# Patient Record
Sex: Male | Born: 1940
Health system: Southern US, Community
[De-identification: ages and names within clinical notes are randomized; demographics above are authoritative.]

## PROBLEM LIST (undated history)

## (undated) DIAGNOSIS — D563 Thalassemia minor: Secondary | ICD-10-CM

## (undated) DIAGNOSIS — R51 Headache: Secondary | ICD-10-CM

## (undated) DIAGNOSIS — N182 Chronic kidney disease, stage 2 (mild): Secondary | ICD-10-CM

## (undated) DIAGNOSIS — Z9289 Personal history of other medical treatment: Secondary | ICD-10-CM

## (undated) DIAGNOSIS — I2510 Atherosclerotic heart disease of native coronary artery without angina pectoris: Secondary | ICD-10-CM

## (undated) DIAGNOSIS — I739 Peripheral vascular disease, unspecified: Secondary | ICD-10-CM

## (undated) DIAGNOSIS — K219 Gastro-esophageal reflux disease without esophagitis: Secondary | ICD-10-CM

## (undated) DIAGNOSIS — C801 Malignant (primary) neoplasm, unspecified: Secondary | ICD-10-CM

## (undated) DIAGNOSIS — I82409 Acute embolism and thrombosis of unspecified deep veins of unspecified lower extremity: Secondary | ICD-10-CM

## (undated) DIAGNOSIS — D509 Iron deficiency anemia, unspecified: Secondary | ICD-10-CM

## (undated) DIAGNOSIS — Z950 Presence of cardiac pacemaker: Secondary | ICD-10-CM

## (undated) DIAGNOSIS — E119 Type 2 diabetes mellitus without complications: Secondary | ICD-10-CM

## (undated) DIAGNOSIS — I10 Essential (primary) hypertension: Secondary | ICD-10-CM

## (undated) DIAGNOSIS — R05 Cough: Secondary | ICD-10-CM

## (undated) DIAGNOSIS — E785 Hyperlipidemia, unspecified: Secondary | ICD-10-CM

## (undated) DIAGNOSIS — T464X5A Adverse effect of angiotensin-converting-enzyme inhibitors, initial encounter: Secondary | ICD-10-CM

## (undated) DIAGNOSIS — J449 Chronic obstructive pulmonary disease, unspecified: Secondary | ICD-10-CM

## (undated) DIAGNOSIS — Z803 Family history of malignant neoplasm of breast: Secondary | ICD-10-CM

## (undated) DIAGNOSIS — I480 Paroxysmal atrial fibrillation: Secondary | ICD-10-CM

## (undated) DIAGNOSIS — K5790 Diverticulosis of intestine, part unspecified, without perforation or abscess without bleeding: Secondary | ICD-10-CM

## (undated) DIAGNOSIS — H269 Unspecified cataract: Secondary | ICD-10-CM

## (undated) DIAGNOSIS — S46009A Unspecified injury of muscle(s) and tendon(s) of the rotator cuff of unspecified shoulder, initial encounter: Secondary | ICD-10-CM

## (undated) DIAGNOSIS — Z8546 Personal history of malignant neoplasm of prostate: Secondary | ICD-10-CM

## (undated) DIAGNOSIS — R0789 Other chest pain: Secondary | ICD-10-CM

## (undated) DIAGNOSIS — R058 Other specified cough: Secondary | ICD-10-CM

## (undated) HISTORY — DX: Type 2 diabetes mellitus without complications: E11.9

## (undated) HISTORY — PX: ILIAC ARTERY ANEURYSM REPAIR: SUR1158

## (undated) HISTORY — DX: Diverticulosis of intestine, part unspecified, without perforation or abscess without bleeding: K57.90

## (undated) HISTORY — DX: Essential (primary) hypertension: I10

## (undated) HISTORY — DX: Adverse effect of angiotensin-converting-enzyme inhibitors, initial encounter: T46.4X5A

## (undated) HISTORY — DX: Unspecified cataract: H26.9

## (undated) HISTORY — PX: CATARACT EXTRACTION W/ INTRAOCULAR LENS IMPLANT: SHX1309

## (undated) HISTORY — DX: Hyperlipidemia, unspecified: E78.5

## (undated) HISTORY — DX: Family history of malignant neoplasm of breast: Z80.3

## (undated) HISTORY — DX: Gastro-esophageal reflux disease without esophagitis: K21.9

## (undated) HISTORY — DX: Personal history of malignant neoplasm of prostate: Z85.46

## (undated) HISTORY — PX: COLONOSCOPY: SHX174

## (undated) HISTORY — DX: Chronic kidney disease, stage 2 (mild): N18.2

## (undated) HISTORY — PX: CIRCUMCISION: SUR203

## (undated) HISTORY — DX: Other chest pain: R07.89

## (undated) HISTORY — DX: Acute embolism and thrombosis of unspecified deep veins of unspecified lower extremity: I82.409

## (undated) HISTORY — DX: Cough: R05

## (undated) HISTORY — DX: Other specified cough: R05.8

---

## 1999-11-06 ENCOUNTER — Encounter: Payer: Self-pay | Admitting: Emergency Medicine

## 1999-11-06 ENCOUNTER — Emergency Department (HOSPITAL_COMMUNITY): Admission: EM | Admit: 1999-11-06 | Discharge: 1999-11-06 | Payer: Self-pay | Admitting: Emergency Medicine

## 2000-05-22 ENCOUNTER — Emergency Department (HOSPITAL_COMMUNITY): Admission: EM | Admit: 2000-05-22 | Discharge: 2000-05-22 | Payer: Self-pay

## 2000-05-23 ENCOUNTER — Ambulatory Visit (HOSPITAL_COMMUNITY): Admission: RE | Admit: 2000-05-23 | Discharge: 2000-05-23 | Payer: Self-pay

## 2001-11-16 ENCOUNTER — Ambulatory Visit (HOSPITAL_COMMUNITY): Admission: RE | Admit: 2001-11-16 | Discharge: 2001-11-16 | Payer: Self-pay | Admitting: Emergency Medicine

## 2001-11-16 ENCOUNTER — Encounter: Payer: Self-pay | Admitting: Emergency Medicine

## 2004-05-04 ENCOUNTER — Encounter: Admission: RE | Admit: 2004-05-04 | Discharge: 2004-05-04 | Payer: Self-pay | Admitting: Emergency Medicine

## 2004-05-06 ENCOUNTER — Encounter: Admission: RE | Admit: 2004-05-06 | Discharge: 2004-05-06 | Payer: Self-pay | Admitting: Emergency Medicine

## 2004-10-16 ENCOUNTER — Ambulatory Visit: Payer: Self-pay | Admitting: Family Medicine

## 2004-11-09 ENCOUNTER — Ambulatory Visit: Payer: Self-pay | Admitting: Family Medicine

## 2004-12-25 ENCOUNTER — Ambulatory Visit: Payer: Self-pay | Admitting: Family Medicine

## 2005-01-01 ENCOUNTER — Ambulatory Visit: Payer: Self-pay | Admitting: Family Medicine

## 2005-01-22 ENCOUNTER — Ambulatory Visit: Payer: Self-pay | Admitting: Family Medicine

## 2005-03-29 ENCOUNTER — Ambulatory Visit: Payer: Self-pay | Admitting: Family Medicine

## 2005-09-30 ENCOUNTER — Ambulatory Visit: Payer: Self-pay | Admitting: Internal Medicine

## 2005-10-28 ENCOUNTER — Ambulatory Visit: Payer: Self-pay | Admitting: Family Medicine

## 2005-11-11 ENCOUNTER — Ambulatory Visit: Payer: Self-pay | Admitting: Family Medicine

## 2006-02-15 ENCOUNTER — Ambulatory Visit: Payer: Self-pay | Admitting: Family Medicine

## 2006-05-06 ENCOUNTER — Ambulatory Visit: Payer: Self-pay | Admitting: Family Medicine

## 2006-05-06 LAB — CONVERTED CEMR LAB
ALT: 26 units/L (ref 0–40)
AST: 18 units/L (ref 0–37)
Albumin: 4 g/dL (ref 3.5–5.2)
Alkaline Phosphatase: 55 units/L (ref 39–117)
BUN: 6 mg/dL (ref 6–23)
Basophils Absolute: 0 10*3/uL (ref 0.0–0.1)
Basophils Relative: 0.5 % (ref 0.0–1.0)
CO2: 32 meq/L (ref 19–32)
Calcium: 9.8 mg/dL (ref 8.4–10.5)
Chloride: 105 meq/L (ref 96–112)
Cholesterol: 188 mg/dL (ref 0–200)
Creatinine, Ser: 1 mg/dL (ref 0.4–1.5)
Eosinophils Absolute: 0.2 10*3/uL (ref 0.0–0.6)
Eosinophils Relative: 3.4 % (ref 0.0–5.0)
GFR calc Af Amer: 96 mL/min
GFR calc non Af Amer: 79 mL/min
Glucose, Bld: 102 mg/dL — ABNORMAL HIGH (ref 70–99)
HCT: 42.3 % (ref 39.0–52.0)
HDL: 32.9 mg/dL — ABNORMAL LOW (ref >39.0)
Hemoglobin: 13.7 g/dL (ref 13.0–17.0)
Hgb A1c MFr Bld: 6.8 % — ABNORMAL HIGH (ref 4.6–6.0)
LDL Cholesterol: 124 mg/dL — ABNORMAL HIGH (ref 0–99)
Lymphocytes Relative: 33.5 % (ref 12.0–46.0)
MCHC: 32.3 g/dL (ref 30.0–36.0)
MCV: 72.2 fL — ABNORMAL LOW (ref 78.0–100.0)
Monocytes Absolute: 0.5 10*3/uL (ref 0.2–0.7)
Monocytes Relative: 9.8 % (ref 3.0–11.0)
Neutro Abs: 2.8 10*3/uL (ref 1.4–7.7)
Neutrophils Relative %: 52.8 % (ref 43.0–77.0)
PSA: 4.22 ng/mL — ABNORMAL HIGH (ref 0.10–4.00)
Platelets: 228 10*3/uL (ref 150–400)
Potassium: 3.7 meq/L (ref 3.5–5.1)
RBC: 5.85 M/uL — ABNORMAL HIGH (ref 4.22–5.81)
RDW: 16.6 % — ABNORMAL HIGH (ref 11.5–14.6)
Sodium: 142 meq/L (ref 135–145)
TSH: 1.49 microintl units/mL (ref 0.35–5.50)
Total Bilirubin: 0.8 mg/dL (ref 0.3–1.2)
Total CHOL/HDL Ratio: 5.7
Total Protein: 7 g/dL (ref 6.0–8.3)
Triglycerides: 155 mg/dL — ABNORMAL HIGH (ref 0–149)
VLDL: 31 mg/dL (ref 0–40)
WBC: 5.3 10*3/uL (ref 4.5–10.5)

## 2006-05-13 ENCOUNTER — Ambulatory Visit: Payer: Self-pay | Admitting: Family Medicine

## 2006-05-25 ENCOUNTER — Ambulatory Visit: Payer: Self-pay | Admitting: Gastroenterology

## 2006-06-07 ENCOUNTER — Ambulatory Visit: Payer: Self-pay | Admitting: Gastroenterology

## 2007-04-21 ENCOUNTER — Ambulatory Visit: Payer: Self-pay | Admitting: Family Medicine

## 2007-04-21 DIAGNOSIS — K219 Gastro-esophageal reflux disease without esophagitis: Secondary | ICD-10-CM | POA: Insufficient documentation

## 2007-04-21 DIAGNOSIS — E785 Hyperlipidemia, unspecified: Secondary | ICD-10-CM | POA: Insufficient documentation

## 2007-04-21 DIAGNOSIS — I10 Essential (primary) hypertension: Secondary | ICD-10-CM | POA: Insufficient documentation

## 2007-04-25 ENCOUNTER — Ambulatory Visit: Payer: Self-pay | Admitting: Family Medicine

## 2007-04-25 DIAGNOSIS — Z87891 Personal history of nicotine dependence: Secondary | ICD-10-CM | POA: Insufficient documentation

## 2007-05-07 DIAGNOSIS — R05 Cough: Secondary | ICD-10-CM | POA: Insufficient documentation

## 2007-05-07 DIAGNOSIS — R059 Cough, unspecified: Secondary | ICD-10-CM | POA: Insufficient documentation

## 2007-05-07 DIAGNOSIS — R053 Chronic cough: Secondary | ICD-10-CM | POA: Insufficient documentation

## 2007-05-26 ENCOUNTER — Ambulatory Visit: Payer: Self-pay | Admitting: Family Medicine

## 2007-06-23 ENCOUNTER — Ambulatory Visit: Payer: Self-pay | Admitting: Family Medicine

## 2007-07-10 ENCOUNTER — Encounter: Payer: Self-pay | Admitting: Family Medicine

## 2007-07-11 ENCOUNTER — Encounter: Payer: Self-pay | Admitting: Family Medicine

## 2007-07-11 ENCOUNTER — Telehealth: Payer: Self-pay | Admitting: Family Medicine

## 2007-08-22 ENCOUNTER — Ambulatory Visit: Payer: Self-pay | Admitting: Family Medicine

## 2007-08-28 ENCOUNTER — Ambulatory Visit: Payer: Self-pay | Admitting: Family Medicine

## 2007-08-29 ENCOUNTER — Telehealth: Payer: Self-pay | Admitting: Family Medicine

## 2007-09-25 ENCOUNTER — Ambulatory Visit: Payer: Self-pay | Admitting: Cardiology

## 2007-09-25 ENCOUNTER — Emergency Department (HOSPITAL_COMMUNITY): Admission: EM | Admit: 2007-09-25 | Discharge: 2007-09-25 | Payer: Self-pay | Admitting: Emergency Medicine

## 2007-09-27 ENCOUNTER — Ambulatory Visit: Payer: Self-pay

## 2007-09-29 ENCOUNTER — Emergency Department (HOSPITAL_COMMUNITY): Admission: EM | Admit: 2007-09-29 | Discharge: 2007-09-29 | Payer: Self-pay | Admitting: Emergency Medicine

## 2007-10-11 ENCOUNTER — Ambulatory Visit: Payer: Self-pay | Admitting: Family Medicine

## 2007-10-11 DIAGNOSIS — M25519 Pain in unspecified shoulder: Secondary | ICD-10-CM | POA: Insufficient documentation

## 2008-06-05 ENCOUNTER — Emergency Department (HOSPITAL_COMMUNITY): Admission: EM | Admit: 2008-06-05 | Discharge: 2008-06-05 | Payer: Self-pay | Admitting: Emergency Medicine

## 2008-06-05 ENCOUNTER — Encounter: Payer: Self-pay | Admitting: Endocrinology

## 2008-06-05 DIAGNOSIS — G459 Transient cerebral ischemic attack, unspecified: Secondary | ICD-10-CM | POA: Insufficient documentation

## 2008-06-06 ENCOUNTER — Ambulatory Visit: Payer: Self-pay | Admitting: Family Medicine

## 2008-06-09 ENCOUNTER — Encounter: Admission: RE | Admit: 2008-06-09 | Discharge: 2008-06-09 | Payer: Self-pay | Admitting: Family Medicine

## 2008-06-11 ENCOUNTER — Ambulatory Visit: Payer: Self-pay

## 2008-06-13 ENCOUNTER — Telehealth (INDEPENDENT_AMBULATORY_CARE_PROVIDER_SITE_OTHER): Payer: Self-pay | Admitting: *Deleted

## 2008-06-13 ENCOUNTER — Telehealth: Payer: Self-pay | Admitting: Internal Medicine

## 2008-06-22 ENCOUNTER — Emergency Department (HOSPITAL_COMMUNITY): Admission: EM | Admit: 2008-06-22 | Discharge: 2008-06-23 | Payer: Self-pay | Admitting: Emergency Medicine

## 2008-06-25 ENCOUNTER — Ambulatory Visit: Payer: Self-pay | Admitting: Family Medicine

## 2008-07-26 ENCOUNTER — Telehealth: Payer: Self-pay | Admitting: Family Medicine

## 2008-08-09 ENCOUNTER — Emergency Department (HOSPITAL_COMMUNITY): Admission: EM | Admit: 2008-08-09 | Discharge: 2008-08-09 | Payer: Self-pay | Admitting: Emergency Medicine

## 2008-08-15 ENCOUNTER — Telehealth: Payer: Self-pay | Admitting: Family Medicine

## 2008-08-17 ENCOUNTER — Emergency Department (HOSPITAL_COMMUNITY): Admission: EM | Admit: 2008-08-17 | Discharge: 2008-08-17 | Payer: Self-pay | Admitting: Emergency Medicine

## 2008-08-19 ENCOUNTER — Ambulatory Visit: Payer: Self-pay | Admitting: Family Medicine

## 2008-08-19 DIAGNOSIS — R51 Headache: Secondary | ICD-10-CM | POA: Insufficient documentation

## 2008-08-19 DIAGNOSIS — R519 Headache, unspecified: Secondary | ICD-10-CM | POA: Insufficient documentation

## 2008-09-06 ENCOUNTER — Ambulatory Visit: Payer: Self-pay | Admitting: Internal Medicine

## 2008-09-06 ENCOUNTER — Observation Stay (HOSPITAL_COMMUNITY): Admission: EM | Admit: 2008-09-06 | Discharge: 2008-09-07 | Payer: Self-pay | Admitting: Emergency Medicine

## 2008-09-13 ENCOUNTER — Encounter: Payer: Self-pay | Admitting: Internal Medicine

## 2008-09-13 ENCOUNTER — Telehealth: Payer: Self-pay | Admitting: Internal Medicine

## 2008-09-13 DIAGNOSIS — R079 Chest pain, unspecified: Secondary | ICD-10-CM | POA: Insufficient documentation

## 2008-09-13 HISTORY — DX: Chest pain, unspecified: R07.9

## 2008-09-19 ENCOUNTER — Telehealth: Payer: Self-pay | Admitting: Internal Medicine

## 2008-09-19 ENCOUNTER — Encounter: Payer: Self-pay | Admitting: Internal Medicine

## 2008-09-25 ENCOUNTER — Telehealth: Payer: Self-pay | Admitting: Internal Medicine

## 2008-10-08 ENCOUNTER — Encounter: Admission: RE | Admit: 2008-10-08 | Discharge: 2008-10-08 | Payer: Self-pay | Admitting: Internal Medicine

## 2009-02-11 ENCOUNTER — Emergency Department (HOSPITAL_COMMUNITY): Admission: EM | Admit: 2009-02-11 | Discharge: 2009-02-11 | Payer: Self-pay | Admitting: Emergency Medicine

## 2009-04-21 ENCOUNTER — Encounter (INDEPENDENT_AMBULATORY_CARE_PROVIDER_SITE_OTHER): Payer: Self-pay | Admitting: *Deleted

## 2009-04-21 ENCOUNTER — Encounter: Admission: RE | Admit: 2009-04-21 | Discharge: 2009-04-21 | Payer: Self-pay | Admitting: Internal Medicine

## 2009-05-05 ENCOUNTER — Ambulatory Visit: Payer: Self-pay | Admitting: Surgery

## 2009-05-05 ENCOUNTER — Encounter: Admission: RE | Admit: 2009-05-05 | Discharge: 2009-05-05 | Payer: Self-pay | Admitting: Surgery

## 2009-05-05 ENCOUNTER — Encounter (INDEPENDENT_AMBULATORY_CARE_PROVIDER_SITE_OTHER): Payer: Self-pay | Admitting: *Deleted

## 2009-05-16 ENCOUNTER — Emergency Department (HOSPITAL_COMMUNITY): Admission: EM | Admit: 2009-05-16 | Discharge: 2009-05-16 | Payer: Self-pay | Admitting: Emergency Medicine

## 2009-07-21 ENCOUNTER — Emergency Department (HOSPITAL_COMMUNITY): Admission: EM | Admit: 2009-07-21 | Discharge: 2009-07-21 | Payer: Self-pay | Admitting: Emergency Medicine

## 2009-07-24 ENCOUNTER — Emergency Department (HOSPITAL_COMMUNITY): Admission: EM | Admit: 2009-07-24 | Discharge: 2009-07-24 | Payer: Self-pay | Admitting: Emergency Medicine

## 2009-07-28 ENCOUNTER — Telehealth: Payer: Self-pay | Admitting: Family Medicine

## 2009-08-23 ENCOUNTER — Observation Stay (HOSPITAL_COMMUNITY): Admission: EM | Admit: 2009-08-23 | Discharge: 2009-08-25 | Payer: Self-pay | Admitting: Emergency Medicine

## 2009-08-23 ENCOUNTER — Encounter (INDEPENDENT_AMBULATORY_CARE_PROVIDER_SITE_OTHER): Payer: Self-pay | Admitting: *Deleted

## 2009-08-25 ENCOUNTER — Encounter (INDEPENDENT_AMBULATORY_CARE_PROVIDER_SITE_OTHER): Payer: Self-pay | Admitting: Internal Medicine

## 2009-08-26 ENCOUNTER — Encounter (INDEPENDENT_AMBULATORY_CARE_PROVIDER_SITE_OTHER): Payer: Self-pay | Admitting: *Deleted

## 2009-09-18 ENCOUNTER — Ambulatory Visit: Payer: Self-pay | Admitting: Vascular Surgery

## 2009-09-22 ENCOUNTER — Ambulatory Visit (HOSPITAL_COMMUNITY): Admission: RE | Admit: 2009-09-22 | Discharge: 2009-09-22 | Payer: Self-pay | Admitting: Vascular Surgery

## 2009-09-22 ENCOUNTER — Ambulatory Visit: Payer: Self-pay | Admitting: Vascular Surgery

## 2009-09-26 ENCOUNTER — Encounter: Payer: Self-pay | Admitting: Gastroenterology

## 2009-10-01 ENCOUNTER — Inpatient Hospital Stay (HOSPITAL_COMMUNITY): Admission: RE | Admit: 2009-10-01 | Discharge: 2009-10-06 | Payer: Self-pay | Admitting: Vascular Surgery

## 2009-10-01 ENCOUNTER — Telehealth: Payer: Self-pay | Admitting: Gastroenterology

## 2009-10-01 HISTORY — PX: PR VEIN BYPASS GRAFT,AORTO-FEM-POP: 35551

## 2009-10-03 ENCOUNTER — Encounter: Payer: Self-pay | Admitting: Vascular Surgery

## 2009-10-03 ENCOUNTER — Ambulatory Visit: Payer: Self-pay | Admitting: Vascular Surgery

## 2009-10-03 DIAGNOSIS — K5730 Diverticulosis of large intestine without perforation or abscess without bleeding: Secondary | ICD-10-CM | POA: Insufficient documentation

## 2009-10-03 DIAGNOSIS — D509 Iron deficiency anemia, unspecified: Secondary | ICD-10-CM | POA: Insufficient documentation

## 2009-10-03 DIAGNOSIS — R634 Abnormal weight loss: Secondary | ICD-10-CM | POA: Insufficient documentation

## 2009-10-17 ENCOUNTER — Emergency Department (HOSPITAL_COMMUNITY): Admission: EM | Admit: 2009-10-17 | Discharge: 2009-10-17 | Payer: Self-pay | Admitting: Emergency Medicine

## 2009-10-20 ENCOUNTER — Encounter: Admission: RE | Admit: 2009-10-20 | Discharge: 2009-10-20 | Payer: Self-pay | Admitting: Internal Medicine

## 2009-10-22 ENCOUNTER — Encounter (HOSPITAL_COMMUNITY): Admission: RE | Admit: 2009-10-22 | Discharge: 2009-12-19 | Payer: Self-pay | Admitting: Internal Medicine

## 2009-10-29 ENCOUNTER — Telehealth: Payer: Self-pay | Admitting: Family Medicine

## 2009-10-30 ENCOUNTER — Ambulatory Visit (HOSPITAL_COMMUNITY): Admission: RE | Admit: 2009-10-30 | Discharge: 2009-10-30 | Payer: Self-pay | Admitting: Gastroenterology

## 2009-11-06 ENCOUNTER — Ambulatory Visit: Payer: Self-pay | Admitting: Vascular Surgery

## 2009-12-17 ENCOUNTER — Ambulatory Visit: Payer: Self-pay | Admitting: Vascular Surgery

## 2010-03-18 ENCOUNTER — Ambulatory Visit: Payer: Self-pay | Admitting: Vascular Surgery

## 2010-04-17 ENCOUNTER — Emergency Department (HOSPITAL_COMMUNITY)
Admission: EM | Admit: 2010-04-17 | Discharge: 2010-04-18 | Payer: Self-pay | Source: Home / Self Care | Admitting: Emergency Medicine

## 2010-04-27 LAB — COMPREHENSIVE METABOLIC PANEL
ALT: 17 U/L (ref 0–53)
AST: 18 U/L (ref 0–37)
Albumin: 3.6 g/dL (ref 3.5–5.2)
Alkaline Phosphatase: 68 U/L (ref 39–117)
BUN: 12 mg/dL (ref 6–23)
CO2: 28 mEq/L (ref 19–32)
Calcium: 9.3 mg/dL (ref 8.4–10.5)
Chloride: 107 mEq/L (ref 96–112)
Creatinine, Ser: 1.09 mg/dL (ref 0.4–1.5)
GFR calc Af Amer: >60 mL/min (ref >60)
GFR calc non Af Amer: >60 mL/min (ref >60)
Glucose, Bld: 127 mg/dL — ABNORMAL HIGH (ref 70–99)
Potassium: 3.9 mEq/L (ref 3.5–5.1)
Sodium: 143 mEq/L (ref 135–145)
Total Bilirubin: 0.3 mg/dL (ref 0.3–1.2)
Total Protein: 6.7 g/dL (ref 6.0–8.3)

## 2010-04-27 LAB — POCT CARDIAC MARKERS
CKMB, poc: 4.2 ng/mL (ref 1.0–8.0)
Myoglobin, poc: 147 ng/mL (ref 12–200)
Troponin i, poc: <0.05 ng/mL (ref 0.00–0.09)

## 2010-04-27 LAB — CBC
HCT: 38.7 % — ABNORMAL LOW (ref 39.0–52.0)
Hemoglobin: 12.2 g/dL — ABNORMAL LOW (ref 13.0–17.0)
MCH: 22 pg — ABNORMAL LOW (ref 26.0–34.0)
MCHC: 31.5 g/dL (ref 30.0–36.0)
MCV: 69.9 fL — ABNORMAL LOW (ref 78.0–100.0)
Platelets: 162 10*3/uL (ref 150–400)
RBC: 5.54 MIL/uL (ref 4.22–5.81)
RDW: 17.7 % — ABNORMAL HIGH (ref 11.5–15.5)
WBC: 4.9 10*3/uL (ref 4.0–10.5)

## 2010-04-27 LAB — LIPASE, BLOOD: Lipase: 58 U/L (ref 11–59)

## 2010-05-03 ENCOUNTER — Encounter: Payer: Self-pay | Admitting: Neurological Surgery

## 2010-05-03 ENCOUNTER — Encounter: Payer: Self-pay | Admitting: Internal Medicine

## 2010-05-13 DEATH — deceased

## 2010-05-14 NOTE — Letter (Signed)
Summary: triage message  triage message   Imported By: Kassie Mends 07/11/2007 15:50:28  _____________________________________________________________________  External Attachment:    Type:   Image     Comment:   triage message

## 2010-05-14 NOTE — Progress Notes (Signed)
Summary: cx cath   Phone Note Outgoing Call   Call placed to: Patient Summary of Call: Joel [name] called pt because he had not shown up for labwork and his cath is sch for 6/18, pt stated he felt pretty good right now and is cancelling his cath because he has too much going on right now, will let Dr Lovell Sheehan and Dr Gala Romney know pt to call Dr Lovell Sheehan if has further problems Meredith Staggers, RN  September 25, 2008 8:52 AM       Appended Document: cx cath agreed.

## 2010-05-14 NOTE — Letter (Signed)
Summary: Cardiac Catheterization Instructions- [facility]  [facility], Main Office  [address]   [place], [place] [address]   Phone: [phone]  Fax: [phone]     09/19/2008 MRN: [id number]  Joel [name] [address] [place], [place]  [address]  Dear [name],   You are scheduled for a Cardiac Catheterization on Fri 6/18 with  Dr. Antoine Poche  Please arrive to the 1st floor of the Heart and Vascular Center at [facility] at 10:30 am / pm on the day of your procedure. Please do not arrive before 6:30 a.m. Call the Heart and Vascular Center at [phone] if you are unable to make your appointmnet. The Code to get into the parking garage under the building is 0700. Take the elevators to the 1st floor. You must have someone to drive you home. Someone must be with you for the first 24 hours after you arrive home. Please wear clothes that are easy to get on and off and wear slip-on shoes. Do not eat or drink after midnight except water with your medications that morning. Bring all your medications and current insurance cards with you.  _X__ DO NOT take these medications before your procedure: _______HCTZ_________________________________________________________  ___ Make sure you take your aspirin.  ___ You may take ALL of your medications with water that morning. ________________________________________________________________________________________________________________________________  ___ DO NOT take ANY medications before your procedure.  ___ Pre-med instructions:  ________________________________________________________________________________________________________________________________  The usual length of stay after your procedure is 2 to 3 hours. This can vary.  If you have any questions, please call the office at the number listed above.   Meredith Staggers, RN

## 2010-05-14 NOTE — Progress Notes (Signed)
Summary: resch cath   Phone Note Outgoing Call   Call placed by: Meredith Staggers, RN,  September 19, 2008 8:41 AM Call placed to: Patient Summary of Call: pt was sch for cath today at 10:30, never came in for labwork called pt, he stated he lost his paper with the info and needed to resch cath to next fri 6/18, called cath lab resch to 6/18 at 11:30 with Dr Antoine Poche, called pt back with instructions will also mail them to pt, pt sch to come for labs on Mon 6/14, pt agreeable

## 2010-05-14 NOTE — Progress Notes (Signed)
Summary: ins call  Phone Note Call from Patient   Summary of Call: pt walked in . left paper from [employer] by [employer] ins. we are to call [phone] to confirm his dx of htn. Initial call taken by: Sindy Guadeloupe RN,  July 11, 2007 1:53 PM  Follow-up for Phone Call        per dr Jonia Oakey: [facility] called to confirm dx of htn Follow-up by: Sindy Guadeloupe RN,  July 11, 2007 1:54 PM

## 2010-05-14 NOTE — Letter (Signed)
Summary: [facility]   Imported By: Kassie Mends 07/11/2007 15:48:52  _____________________________________________________________________  External Attachment:    Type:   Image     Comment:   [facility]

## 2010-05-14 NOTE — Miscellaneous (Signed)
Summary: Orders Update  Medications Added AGGRENOX 25-200 MG XR12H-CAP (ASPIRIN-DIPYRIDAMOLE) 1 bid       Clinical Lists Changes  Problems: Added new problem of TIA (ICD-435.9) Medications: Removed medication of BL ASPIRIN 325 MG  TABS (ASPIRIN) once daily Added new medication of AGGRENOX 25-200 MG XR12H-CAP (ASPIRIN-DIPYRIDAMOLE) 1 bid - Signed Rx of AGGRENOX 25-200 MG XR12H-CAP (ASPIRIN-DIPYRIDAMOLE) 1 bid;  #60 x 0;  Signed;  Entered by: Minus Breeding MD;  Authorized by: Minus Breeding MD;  Method used: Electronically to [facility]  [id number]*, [address] [place], [place], [place]  [address], Ph: [phone] or [phone], Fax: [phone] Orders: Added new Referral order of Vascular Clinic (Vascular) - Signed Added new Referral order of Radiology Referral (Radiology) - Signed    Prescriptions: AGGRENOX 25-200 MG XR12H-CAP (ASPIRIN-DIPYRIDAMOLE) 1 bid  #60 x 0   Entered and Authorized by:   Minus Breeding MD   Signed by:   Minus Breeding MD on 06/05/2008   Method used:   Electronically to        [facility]  [id number]* (retail)       [address]       [place], [place]  [address]       Ph: [phone] or [phone]       Fax: [phone]   RxID:   [id number]

## 2010-05-14 NOTE — Assessment & Plan Note (Signed)
Summary: roa/jls   Vital Signs:  Patient Profile:   70 Years Old Male Weight:      234 pounds Temp:     97.9 degrees F Pulse rate:   60 / minute BP sitting:   118 / 70  Vitals Entered By: Sindy Guadeloupe RN (April 25, 2007 1:40 PM)                 Chief Complaint:  follow up.  History of Present Illness: she is a 70 year old male with underlying hypertension, and tobacco abuse, who comes in today for follow-up of hypertension.  His blood pressure was markedly elevated.  Last week.  We begin lisinopril 30 mg a day.  He comes back today for follow-up.  He says he feels well has had no side effects from the medication and indeed, his headache is gone.  Last week his systolic blood pressure was running 170 to 180.  Now today his blood pressure is 140/70.  Current Allergies (reviewed today): No known allergies     Risk Factors:     Counseled to quit/cut down tobacco use:  yes   Review of Systems      See HPI   Physical Exam  General:     Well-developed,well-nourished,in no acute distress; alert,appropriate and cooperative throughout examination Heart:     140/70    Impression & Recommendations:  Problem # 1:  HYPERTENSION (ICD-401.9) Assessment: Improved  His updated medication list for this problem includes:    Lisinopril 20 Mg Tabs (Lisinopril) .Marland Kitchen... 1 1/2  tabs once daily    Hydrochlorothiazide 25 Mg Tabs (Hydrochlorothiazide) .Marland Kitchen... Take 1 tablet by mouth once a day    Atenolol 50 Mg Tabs (Atenolol) .Marland Kitchen... 1 and 1/2 tasbs qam   Problem # 2:  TOBACCO ABUSE (ICD-305.1) Assessment: Unchanged  Complete Medication List: 1)  Lisinopril 20 Mg Tabs (Lisinopril) .Marland Kitchen.. 1 1/2  tabs once daily 2)  Omeprazole 20 Mg Tbec (Omeprazole) .... Qs 3)  Hydrochlorothiazide 25 Mg Tabs (Hydrochlorothiazide) .... Take 1 tablet by mouth once a day 4)  Atenolol 50 Mg Tabs (Atenolol) .Marland Kitchen.. 1 and 1/2 tasbs qam 5)  Bl Aspirin 325 Mg Tabs (Aspirin) .... Once daily   Patient  Instructions: 1)  Tobacco is very bad for your health and your loved ones! You Should stop smoking!. 2)  Stop Smoking Tips: Choose a Quit date. Cut down before the Quit date. decide what you will do as a substitute when you feel the urge to smoke(gum,toothpick,exercise). 3)  Take an Aspirin every day. 4)  Check your Blood Pressure regularly. If it is above: you should make an appointment. checks her blood pressure twice a day.  Return in 4 weeks for follow-up.  Strongly consider a no smoking program.  We discussed the negative problems with tobacco, heart attack, stroke, lung cancer, bladder cancer, etc.    ]

## 2010-05-14 NOTE — Assessment & Plan Note (Signed)
Summary: 1 month roa/jls   Vital Signs:  Patient Profile:   70 Years Old Male Weight:      231 pounds Temp:     98 degrees F oral Pulse rate:   70 / minute Resp:     12 per minute BP sitting:   163 / 88  Pt. in pain?   no                  Chief Complaint:  follow-up hypertension and and smoking.  History of Present Illness: Joel [name] is a 70 year old male, who comes in today for evaluation of hypertension, and tobacco abuse.  We increased his lisinopril to 30 mg a day.  A month ago.  Now is coughing and he can't stand it.  Says he wakes up at night coughing.  BP at home is running 147/70.  Otherwise, he is tolerating his medicines well.  He is beginning to taper his cigarettes is down 3 or 4 a day.  He says he needs some help to taper further and would like the new smoking cessation program.    Current Allergies: No known allergies   Past Medical History:    Reviewed history from 04/21/2007 and no changes required:       GERD       Hyperlipidemia       Hypertension       tobacco abuse       cough secondary to ACE inhibitor   Social History:    Reviewed history from 04/21/2007 and no changes required:       Single       Current Smoker       Alcohol use-no       Drug use-no       Regular exercise-no   Risk Factors:     Counseled to quit/cut down tobacco use:  yes   Review of Systems      See HPI   Physical Exam  General:     Well-developed,well-nourished,in no acute distress; alert,appropriate and cooperative throughout examination Head:     Normocephalic and atraumatic without obvious abnormalities. No apparent alopecia or balding. Eyes:     No corneal or conjunctival inflammation noted. EOMI. Perrla. Funduscopic exam benign, without hemorrhages, exudates or papilledema. Vision grossly normal. Ears:     External ear exam shows no significant lesions or deformities.  Otoscopic examination reveals clear canals, tympanic membranes are intact  bilaterally without bulging, retraction, inflammation or discharge. Hearing is grossly normal bilaterally. Nose:     External nasal examination shows no deformity or inflammation. Nasal mucosa are pink and moist without lesions or exudates. Mouth:     Oral mucosa and oropharynx without lesions or exudates.  Teeth in good repair. Heart:     140/80    Impression & Recommendations:  Problem # 1:  HYPERTENSION (ICD-401.9) Assessment: Improved  The following medications were removed from the medication list:    Lisinopril 20 Mg Tabs (Lisinopril) .Marland Kitchen... 1 1/2  tabs once daily  His updated medication list for this problem includes:    Hydrochlorothiazide 25 Mg Tabs (Hydrochlorothiazide) .Marland Kitchen... Take 1 tablet by mouth once a day    Atenolol 50 Mg Tabs (Atenolol) .Marland Kitchen... 1 and 1/2 tasbs qam    Benicar 40 Mg Tabs (Olmesartan medoxomil) .Marland Kitchen... Take 1 tablet by mouth every morning  Orders: Tobacco use cessation intermediate 3-10 minutes ([address])   Problem # 2:  TOBACCO ABUSE (ICD-305.1) Assessment: Improved  Orders: Tobacco use cessation  intermediate 3-10 minutes ([address])  His updated medication list for this problem includes:    Chantix Starting Month Pak 0.5 Mg X 11 & 1 Mg X 42 Misc (Varenicline tartrate) ..... Uad   Problem # 3:  COUGH DUE TO ACE INHIBITORS (ICD-786.2) Assessment: New  Orders: Tobacco use cessation intermediate 3-10 minutes ([address])   Complete Medication List: 1)  Omeprazole 20 Mg Tbec (Omeprazole) .... Qs 2)  Hydrochlorothiazide 25 Mg Tabs (Hydrochlorothiazide) .... Take 1 tablet by mouth once a day 3)  Atenolol 50 Mg Tabs (Atenolol) .Marland Kitchen.. 1 and 1/2 tasbs qam 4)  Bl Aspirin 325 Mg Tabs (Aspirin) .... Once daily 5)  Benicar 40 Mg Tabs (Olmesartan medoxomil) .... Take 1 tablet by mouth every morning 6)  Chantix Starting Month Pak 0.5 Mg X 11 & 1 Mg X 42 Misc (Varenicline tartrate) .... Uad   Patient Instructions: 1)  stop the lisinopril.  Began Benicar 40 mg one  tablet every day.  Continue your other medications.  Return in 4 weeks for reevaluation when you come back in 4 weeks bring a notebook with [place] blood pressure readings and the device using to check your blood pressure, so I can check to be, sure it's calibrated right. 2)  Begin the program as we outline for smoking cessation.    Prescriptions: CHANTIX STARTING MONTH PAK 0.5 MG X 11 & 1 MG X 42  MISC (VARENICLINE TARTRATE) UAD  #1 x 0   Entered and Authorized by:   Roderick Pee MD   Signed by:   Roderick Pee MD on 05/26/2007   Method used:   Electronically sent to ...       [facility]  [id number]*       [address]       [place], [place]  [address]       Ph: [phone] or [phone]       Fax: [phone]   RxID:   [id number] BENICAR 40 MG  TABS (OLMESARTAN MEDOXOMIL) Take 1 tablet by mouth every morning  #100 x 4   Entered and Authorized by:   Roderick Pee MD   Signed by:   Roderick Pee MD on 05/26/2007   Method used:   Electronically sent to ...       [facility]  [id number]*       [address]       [place], [place]  [address]       Ph: [phone] or [phone]       Fax: [phone]   RxID:   [id number]  ]

## 2010-05-14 NOTE — Progress Notes (Signed)
Summary: hctz refill  Phone Note Refill Request   Refills Requested: Medication #1:  HYDROCHLOROTHIAZIDE 25 MG  TABS Take 1 tablet by mouth once a day Initial call taken by: Kern Reap CMA Duncan Dull),  July 28, 2009 10:38 AM    Prescriptions: HYDROCHLOROTHIAZIDE 25 MG  TABS (HYDROCHLOROTHIAZIDE) Take 1 tablet by mouth once a day  #100 Tablet x 0   Entered by:   Kern Reap CMA (AAMA)   Authorized by:   Roderick Pee MD   Signed by:   Kern Reap CMA (AAMA) on 07/28/2009   Method used:   Electronically to        [facility] (retail)       [address]       [place], [place]  [id number]       Ph: [phone]       Fax: [phone]   RxID:   [id number]

## 2010-05-14 NOTE — Progress Notes (Signed)
Summary: Triage   Phone Note From Other Clinic   Caller: Dora @ Dr. Nehemiah Settle  [phone] Call For: Dr. Arlyce Dice Summary of Call: Iron Def and anemia...requesting sooner appt. than his sch'd appt on 8.2.11 Initial call taken by: Karna Christmas,  October 01, 2009 9:33 AM  Follow-up for Phone Call        Pt. will see [facility] on 10-06-09 at 1:30pm. Msg. left for Dora to advise pt. of appt/med.list/co-pay/cx.policy and I asked her to fax records to [phone] ATTN: Pam.  Follow-up by: Laureen Ochs LPN,  October 01, 2009 12:31 PM     Appended Document: Triage Pt. was a 'No Show' for appt. today. I have left a msg. for Dora w/Dr.Polite advising her and asked her to callback if pt. needs to be rescheduled.

## 2010-05-14 NOTE — Assessment & Plan Note (Signed)
Summary: 1 month rov/njr   Vital Signs:  Patient Profile:   70 Years Old Male Weight:      231 pounds Temp:     97.8 degrees F BP sitting:   160 / 90  (right arm)  Vitals Entered By: Rock Nephew CMA (June 23, 2007 1:53 PM)                 Chief Complaint:  1 mo follow up.  History of Present Illness: Joel [name] is a 70 year old male, who comes in today for evaluation of hypertension, and tobacco abuse.  His blood pressure is markedly improved on been a car one tablet daily.  We previously had a mono and ACE inhibitor, but he developed a severe cough and was unable to tolerate the ACE inhibitor.  Therefore, we switched him to Benicar 40 mg daily in [place].  Remarkably well.  BP today by me was 130/80.  He continues to smoke.  He says he stands to 7 cigarettes a day from one to two packs a day.  I outlined a tapering program over the next 6 weeks and encouraged him strongly to stop smoking completely.    Current Allergies: ! ZESTRIL  Past Medical History:    Reviewed history from 05/26/2007 and no changes required:       GERD       Hyperlipidemia       Hypertension       tobacco abuse       cough secondary to ACE inhibitor   Social History:    Reviewed history from 04/21/2007 and no changes required:       Single       Current Smoker       Alcohol use-no       Drug use-no       Regular exercise-no   Risk Factors:     Counseled to quit/cut down tobacco use:  yes   Review of Systems      [name] HPI   Physical Exam  General:     Well-developed,well-nourished,in no acute distress; alert,appropriate and cooperative throughout examination Head:     Normocephalic and atraumatic without obvious abnormalities. No apparent alopecia or balding. Eyes:     No corneal or conjunctival inflammation noted. EOMI. Perrla. Funduscopic exam benign, without hemorrhages, exudates or papilledema. Vision grossly normal. Ears:     External ear exam shows no significant  lesions or deformities.  Otoscopic examination reveals clear canals, tympanic membranes are intact bilaterally without bulging, retraction, inflammation or discharge. Hearing is grossly normal bilaterally. Nose:     External nasal examination shows no deformity or inflammation. Nasal mucosa are pink and moist without lesions or exudates. Mouth:     Oral mucosa and oropharynx without lesions or exudates.  Teeth in good repair. Neck:     No deformities, masses, or tenderness noted. Chest Wall:     No deformities, masses, tenderness or gynecomastia noted. Lungs:     Normal respiratory effort, chest expands symmetrically. Lungs are clear to auscultation, no crackles or wheezes. Heart:     130/80    Impression & Recommendations:  Problem # 1:  HYPERTENSION (ICD-401.9) Assessment: Improved  His updated medication list for this problem includes:    Hydrochlorothiazide 25 Mg Tabs (Hydrochlorothiazide) .Marland Kitchen... Take 1 tablet by mouth once a day    Atenolol 50 Mg Tabs (Atenolol) .Marland Kitchen... 1 and 1/2 tasbs qam    Benicar 40 Mg Tabs (Olmesartan medoxomil) .Marland Kitchen... Take 1 tablet by  mouth every morning   Problem # 2:  TOBACCO ABUSE (ICD-305.1) Assessment: Unchanged  The following medications were removed from the medication list:    Chantix Starting Month Pak 0.5 Mg X 11 & 1 Mg X 42 Misc (Varenicline tartrate) ..... Uad  Orders: Tobacco use cessation intermediate 3-10 minutes ([address])   Complete Medication List: 1)  Omeprazole 20 Mg Tbec (Omeprazole) .... Qs 2)  Hydrochlorothiazide 25 Mg Tabs (Hydrochlorothiazide) .... Take 1 tablet by mouth once a day 3)  Atenolol 50 Mg Tabs (Atenolol) .Marland Kitchen.. 1 and 1/2 tasbs qam 4)  Bl Aspirin 325 Mg Tabs (Aspirin) .... Once daily 5)  Benicar 40 Mg Tabs (Olmesartan medoxomil) .... Take 1 tablet by mouth every morning   Patient Instructions: 1)  continue current medical therapy.  Check your blood pressure daily and return in 3 months for reevaluation of your blood  pressure. 2)  Start a smoking cessation program with tapering by one per week.  This will give you a 7-week taper.    ]

## 2010-05-14 NOTE — Progress Notes (Signed)
Summary: Darvocette       New/Updated Medications: DARVOCET A500 100-500 MG TABS (PROPOXYPHENE N-APAP) one by mouth 6 hrs as needed pain   Prescriptions: DARVOCET A500 100-500 MG TABS (PROPOXYPHENE N-APAP) one by mouth 6 hrs as needed pain  #40 x 0   Entered by:   Lynann Beaver CMA   Authorized by:   Gordy Savers  MD   Signed by:   Lynann Beaver CMA on 06/13/2008   Method used:   Telephoned to ...       [facility]  [id number]* (retail)       [address]       [place], [place]  [address]       Ph: [phone] or [phone]       Fax: [phone]   RxID:   [id number]  Pt notified.

## 2010-05-14 NOTE — Discharge Summary (Signed)
Summary: Anemia, Dyspnea  NAME:  Joel [name], Joel [name]             ACCOUNT NO.:  [id number]      MEDICAL RECORD NO.:  [id number]          PATIENT TYPE:  OBV      LOCATION:  1430                         FACILITY:  [facility]      PHYSICIAN:  Deirdre Peer. Polite, M.D. DATE OF BIRTH:  01-May-1940      DATE OF ADMISSION:  08/23/2009   DATE OF DISCHARGE:  08/25/2009                                  DISCHARGE SUMMARY      DISCHARGE DIAGNOSES:   1. Dyspnea, probably secondary to anemia, please note Hemoccult       negative, probably related to blood loss from previous surgery.   2. Anemia, status post 2 units packed red blood cells transfusion.       Hemoglobin prior to transfusion 8.1, follow-up hemoglobin will be       obtained on an outpatient basis.   3. Hypertension controlled.   4. Hyperlipidemia.   5. Tobacco use.   6. Status post iliac aneurysm repair, currently on anticoagulation,       INR at discharge within normal limits.   7. Weight loss, probably multifactorial for further outpatient       followup.   8. Signs of emboli phenomenon, left foot first and third toes with       possible intercurrent infection, already on outpatient antibiotics.       The patient will continue antibiotic course of Keflex.      DISCHARGE MEDICATIONS:   1. Benicar 40 mg daily.   2. Coumadin 5 mg daily.   3. Ferrous sulfate 325 mg t.i.d.   4. Flomax 0.4 mg daily.   5. Keflex 500 mg t.i.d.   6. Pletal 100 mg b.i.d.   7. Protonix 40 mg daily.   8. Tenormin 50 mg 1-1/2 tablets daily.   9. Zocor was discontinued.   10.Roxicodone 5 mg q.6 hours p.r.n.   11.Hydrochlorothiazide 25 mg daily.   12.The patient was asked to hold his aspirin.      DISPOSITION:  The patient was discharged to home in stable condition   with home health PT and OT.  Recommend outpatient followup in 2-3 days.      HISTORY OF PRESENT ILLNESS:  The patient was seen in the ED and   evaluated for shortness of breath.  In the ED, he was  evaluated and   admission was deemed necessary for further evaluation and treatment.   Please see dictated H and P for further details.      PAST MEDICAL HISTORY:  Per admission H and P.      MEDICATION:  Per admission H and P.      SOCIAL HISTORY:  Per admission H and P.      PAST SURGICAL HISTORY:  Per admission H and P.      HOSPITAL COURSE:  The patient was admitted to a telemetry floor bed for   evaluation and treatment of dyspnea.  The patient underwent a 2-D echo   which was normal LV function, no wall motion abnormality.  EKG without  acute change.  Cardiac enzymes negative for ischemia.  The patient did   not have any recurrent complaints of dyspnea while hospitalized.   However, he did feel fatigued.  His hemoglobin did trend down to 8.1.   His Hemoccult was negative.  The patient was transfused 2 units of   packed red blood cells as this was felt to be equivalent of symptomatic   anemia.  So far, no signs of any blood loss other than from his previous   surgery for his iliac aneurysm repair.  The patient believes he has had   an outpatient colonoscopy.  Further records will be reviewed to document   this.  Currently, there is no sign of any active bleeding.  The patient   is at his baseline level of function.  He will be discharged to home for   further outpatient management.  His known vascular disease will be   further managed by his vascular surgeon on an outpatient basis.  The   patient will continue Coumadin for the known embolic phenomenon that has   occurred in his left lower extremity.               Deirdre Peer. Polite, M.D.            RDP/MEDQ  D:  08/26/2009  T:  08/26/2009  Job:  [id number]

## 2010-05-14 NOTE — Progress Notes (Signed)
Summary: Phone note  Phone Note Call from Patient   Summary of Call: Patient c/o headaches. Patient wants to know if he can have a few Propoxyphen until his appointment next week with Dr. Tawanna Cooler. Patient can be reached at [phone]. Pharm[facility]. Initial call taken by: Darra Lis RMA,  July 26, 2008 8:42 AM  Follow-up for Phone Call        patient is aware Follow-up by: Kern Reap CMA,  July 26, 2008 10:22 AM    Stop smoking completely, take 600 mg of Motrin 3 times a day with food as needed for headache.  See me next week per usual, or if her headache gets worse go to [facility] emergency room for neurologic consultation

## 2010-05-14 NOTE — Progress Notes (Signed)
  Phone Note Outgoing Call   Call placed by: jat Call placed to: IPatient Summary of Call: chest x-ray shows no evidence of malignancy however,there is evidence of COPD from persistent smoking.  Advised to stop smoking as we have outlined on his last office visit. Initial call taken by: Roderick Pee MD,  Aug 29, 2007 1:40 PM

## 2010-05-14 NOTE — Progress Notes (Signed)
Summary: hctz  Phone Note Refill Request Message from:  Fax from Pharmacy on October 29, 2009 11:00 AM  Refills Requested: Medication #1:  HYDROCHLOROTHIAZIDE 25 MG  TABS Take 1 tablet by mouth once a day Initial call taken by: Kern Reap CMA (AAMA),  October 29, 2009 11:00 AM    Prescriptions: HYDROCHLOROTHIAZIDE 25 MG  TABS (HYDROCHLOROTHIAZIDE) Take 1 tablet by mouth once a day  #30 x 0   Entered by:   Kern Reap CMA (AAMA)   Authorized by:   Roderick Pee MD   Signed by:   Kern Reap CMA (AAMA) on 10/29/2009   Method used:   Electronically to        [id number] (retail)       [address]       [place], [place]  [id number]       Ph: [phone]       Fax: [phone]   RxID:   [id number]

## 2010-05-14 NOTE — Assessment & Plan Note (Signed)
Summary: SHOULDER PAIN/CCM   Vital Signs:  Patient Profile:   70 Years Old Male Weight:      227 pounds Temp:     98.1 degrees F BP sitting:   130 / 80  (left arm)  Vitals Entered By: Kern Reap CMA (October 11, 2007 11:10 AM)                 Chief Complaint:  left shoulder pain.  History of Present Illness: she is a 70 year old with underlying hypertension, who comes in today for evaluation of left shoulder pain.  Two weeks.  He was seen in the emergency room for evaluation of chest pain.  He underwent an evaluation including a stress test, which was normal.  For his hypertension he takes the atenolol 75 mg daily, Benicar 40 mg daily, and HCTZ, 25 mg daily.  BP today is 140/80.  He says his blood pressures at home are about the same.  He is having pain in his left shoulder for about two to 3 weeks.  He describes it as dull, comes and goes.  It may last for an hour to on a scale of one to 10 of 5.  It does not radiate is not any history to trauma.  He is ambidextrous, however, he is now retired    Current Allergies (reviewed today): ! ZESTRIL   Social History:    Single    Current Smoker    Alcohol use-no    Drug use-no    Regular exercise-no    Retired   Risk Factors:     Counseled to quit/cut down tobacco use:  yes   Review of Systems      See HPI   Physical Exam  General:     Well-developed,well-nourished,in no acute distress; alert,appropriate and cooperative throughout examination Msk:     full range of motion of left shoulder with some crepitance Pulses:     R and L carotid,radial,femoral,dorsalis pedis and posterior tibial pulses are full and equal bilaterally Extremities:     No clubbing, cyanosis, edema, or deformity noted with normal full range of motion of all joints.   Neurologic:     No cranial nerve deficits noted. Station and gait are normal. Plantar reflexes are down-going bilaterally. DTRs are symmetrical throughout. Sensory, motor and  coordinative functions appear intact.    Impression & Recommendations:  Problem # 1:  TOBACCO ABUSE (ICD-305.1) Assessment: Unchanged  Orders: Tobacco use cessation intermediate 3-10 minutes (03474)   Problem # 2:  SHOULDER PAIN, LEFT (ICD-719.41) Assessment: New  His updated medication list for this problem includes:    Bl Aspirin 325 Mg Tabs (Aspirin) ..... Once daily  Orders: Tobacco use cessation intermediate 3-10 minutes (99406)   Complete Medication List: 1)  Omeprazole 20 Mg Tbec (Omeprazole) .... Qs 2)  Hydrochlorothiazide 25 Mg Tabs (Hydrochlorothiazide) .... Take 1 tablet by mouth once a day 3)  Atenolol 50 Mg Tabs (Atenolol) .Marland Kitchen.. 1 and 1/2 tasbs qam 4)  Bl Aspirin 325 Mg Tabs (Aspirin) .... Once daily 5)  Benicar 40 Mg Tabs (Olmesartan medoxomil) .... Take 1 tablet by mouth every morning   Patient Instructions: 1)  begin Motrin 600 mg twice a day with food.  If your symptoms do not resolve call Dr. Norlene Campbell orthopedist for consultation 2)  Tobacco is very bad for your health and your loved ones! You Should stop smoking!. 3)  Stop Smoking Tips: Choose a Quit date. Cut down before the Quit date.  decide what you will do as a substitute when you feel the urge to smoke(gum,toothpick,exercise).   ]

## 2010-05-14 NOTE — Progress Notes (Signed)
Summary: ha's needs refill on darvocet  Phone Note Call from Patient Call back at Home Phone [phone]   Caller: Patient Summary of Call: Pt called saying that he has an appt on 08/21/08 for headaches. Pt has tried motrin and this is not helping.  Pt would like some darvocet called in until he comes in on Monday to see you.  Initial call taken by: Romualdo Bolk, CMA,  Aug 15, 2008 1:13 PM  Follow-up for Phone Call        Rx called in.  Follow-up by: Romualdo Bolk, CMA,  Aug 15, 2008 2:16 PM    New/Updated Medications: DARVOCET A500 100-500 MG TABS (PROPOXYPHENE N-APAP) one by mouth three times a day as needed headaches Darvocet n 100 dispense 30 tablets, one tablet t.i.d., p.r.n. headache, no refills  Prescriptions: DARVOCET A500 100-500 MG TABS (PROPOXYPHENE N-APAP) one by mouth three times a day as needed headaches  #30 x 0   Entered by:   Romualdo Bolk, CMA   Authorized by:   Roderick Pee MD   Signed by:   Romualdo Bolk, CMA on 08/15/2008   Method used:   Telephoned to ...       [id number] (retail)       [address]       [place], [place]  [address]       Ph: [phone] or [phone]       Fax: [phone]   RxID:   [id number]

## 2010-05-14 NOTE — Assessment & Plan Note (Signed)
Summary: headache/mhf  Medications Added LISINOPRIL 30 MG TABS (LISINOPRIL) 1 & 1/2 tab once daily PREDNISONE 20 MG TABS (PREDNISONE) uad PRILOSEC 20 MG CPDR (OMEPRAZOLE) Take 1 tablet by mouth once a day HYDROCHLOROTHIAZIDE 25 MG  TABS (HYDROCHLOROTHIAZIDE) Take 1 tablet by mouth once a day ATENOLOL 50 MG  TABS (ATENOLOL) 1 and 1/2 tasbs qam      Allergies Added: NKDA  Vital Signs:  Patient Profile:   70 Years Old Male Weight:      233 pounds (105.91 kg) Temp:     97.7 degrees F (36.50 degrees C) oral Pulse rate:   63 / minute BP sitting:   171 / 106  Pt. in pain?   yes    Location:   head    Intensity:    2-10    Type:       aching  Vitals Entered By: Arcola Jansky, RN (April 21, 2007 9:26 AM)                  Chief Complaint:  Headache.  History of Present Illness: Joel [name] is a 70 year old male, who comes in today for evaluating of a headache.  The week after Christmas began having a dull headache.  Its diffuse and is constant.  He has no neurologic symptoms.  Review of systems negative  Acute Visit History: The patient has a prior history of GERD.         Current Allergies (reviewed today): No known allergies   Past Medical History:    Reviewed history and no changes required:       GERD       Hyperlipidemia       Hypertension       tobacco abuse   Family History:    Reviewed history and no changes required:       father died at 69, COPD smoker       mom died at 68 breast cancer       5 brothers, one who died at 16 months of age and the other died age 66.  The 3 in good health       3 sisters in good health  Social History:    Reviewed history and no changes required:       Single       Current Smoker       Alcohol use-no       Drug use-no       Regular exercise-no   Risk Factors:  Tobacco use:  current    Counseled to quit/cut down tobacco use:  yes Drug use:  no Alcohol use:  no Exercise:  no   Review of Systems      See  HPI   Physical Exam  General:     Well-developed,well-nourished,in no acute distress; alert,appropriate and cooperative throughout examination Head:     Normocephalic and atraumatic without obvious abnormalities. No apparent alopecia or balding. Eyes:     No corneal or conjunctival inflammation noted. EOMI. Perrla. Funduscopic exam benign, without hemorrhages, exudates or papilledema. Vision grossly normal. Ears:     External ear exam shows no significant lesions or deformities.  Otoscopic examination reveals clear canals, tympanic membranes are intact bilaterally without bulging, retraction, inflammation or discharge. Hearing is grossly normal bilaterally. Nose:     External nasal examination shows no deformity or inflammation. Nasal mucosa are pink and moist without lesions or exudates. Mouth:     Oral mucosa and oropharynx without lesions  or exudates.  Teeth in good repair. Neurologic:     No cranial nerve deficits noted. Station and gait are normal. Plantar reflexes are down-going bilaterally. DTRs are symmetrical throughout. Sensory, motor and coordinative functions appear intact.    Impression & Recommendations:  Problem # 1:  HYPERTENSION (ICD-401.9) Assessment: Deteriorated  His updated medication list for this problem includes:    Lisinopril 30 Mg Tabs (Lisinopril) .Marland Kitchen... 1 & 1/2 tab once daily    Hydrochlorothiazide 25 Mg Tabs (Hydrochlorothiazide) .Marland Kitchen... Take 1 tablet by mouth once a day    Atenolol 50 Mg Tabs (Atenolol) .Marland Kitchen... 1 and 1/2 tasbs qam   Complete Medication List: 1)  Lisinopril 30 Mg Tabs (Lisinopril) .Marland Kitchen.. 1 & 1/2 tab once daily 2)  Prednisone 20 Mg Tabs (Prednisone) .... Uad 3)  Prilosec 20 Mg Cpdr (Omeprazole) .... Take 1 tablet by mouth once a day 4)  Hydrochlorothiazide 25 Mg Tabs (Hydrochlorothiazide) .... Take 1 tablet by mouth once a day 5)  Atenolol 50 Mg Tabs (Atenolol) .Marland Kitchen.. 1 and 1/2 tasbs qam   Patient Instructions: 1)  Tobacco is very bad for  your health and your loved ones! You Should stop smoking!. 2)  Stop Smoking Tips: Choose a Quit date. Cut down before the Quit date. decide what you will do as a substitute when you feel the urge to smoke(gum,toothpick,exercise). 3)  began Zestril, 20 mg a day, starting today.  Continue the atenolol and HCTZ is recurrently doing.  Check your blood pressure 3 times a day.  Return next Tuesday for reevaluation    Prescriptions: ATENOLOL 50 MG  TABS (ATENOLOL) 1 and 1/2 tasbs qam  #150 x 4   Entered and Authorized by:   Roderick Pee MD   Signed by:   Roderick Pee MD on 04/21/2007   Method used:   Print then Give to Patient   RxID:   [id number] HYDROCHLOROTHIAZIDE 25 MG  TABS (HYDROCHLOROTHIAZIDE) Take 1 tablet by mouth once a day  #100 x 4   Entered and Authorized by:   Roderick Pee MD   Signed by:   Roderick Pee MD on 04/21/2007   Method used:   Print then Give to Patient   RxID:   [id number] LISINOPRIL 30 MG TABS (LISINOPRIL) 1 & 1/2 tab once daily  #150 x 4   Entered and Authorized by:   Roderick Pee MD   Signed by:   Roderick Pee MD on 04/21/2007   Method used:   Print then Give to Patient   RxID:   [id number]  ]

## 2010-05-14 NOTE — Progress Notes (Signed)
Summary: sch cath   Phone Note Outgoing Call   Call placed by: Meredith Staggers, RN,  September 13, 2008 2:01 PM Call placed to: Patient Summary of Call: per Dr Gala Romney pt needs to be sch for outpt cath next week, pt wants to have it done on thur 6/10.  Called [facility] sch cath for thur 6/10 w/Dr Excell Seltzer pt aware, labs sch for 6/9, instructions reviewed w/pt over phone and mailed to pt      Appended Document: sch cath    Clinical Lists Changes  Problems: Added new problem of CHEST PAIN UNSPECIFIED (ICD-786.50) Orders: Added new Referral order of Cardiac Catheterization (Cardiac Cath) - Signed

## 2010-05-14 NOTE — Procedures (Signed)
Summary: Joel [name]   Colonoscopy  Procedure date:  06/07/2006  Findings:      Location:  [facility].    Procedures Next Due Date:    Colonoscopy: 06/2016 Patient Name: Joel [name], Joel [name] MRN:  Procedure Procedures: Colonoscopy CPT: 81191.  Personnel: Endoscopist: Barbette Hair. Arlyce Dice, MD.  Referred By: Eugenio Hoes Tawanna Cooler, MD.  Patient Consent: Procedure, Alternatives, Risks and Benefits discussed, consent obtained, from patient.  Indications  Average Risk Screening Routine.  History  Current Medications: Patient is not currently taking Coumadin.  Pre-Exam Physical: Performed Jun 07, 2006. Cardio-pulmonary exam, HEENT exam , Abdominal exam, Mental status exam WNL.  Comments: Patient history reviewed/updated, physical performed prior to initiation of sedation?yes Exam Exam: Extent of exam reached: Cecum, extent intended: Cecum.  The cecum was identified by appendiceal orifice and IC valve. Time to Cecum: 00:02: 04. Time for Withdrawl: 00:05:41. [name] retroflexion performed. ASA Classification: I. Tolerance: good.  Monitoring: Pulse and BP monitoring, Oximetry used. Supplemental O2 given. at 2 Liters.  [name] Prep Used Miralax for [name] prep. Prep results: good.  Sedation Meds: Patient assessed and found to be appropriate for moderate (conscious) sedation. Sedation was managed by the Endoscopist. Fentanyl 50 mcg. given IV. Versed 6 mg. given IV.  Findings - DIVERTICULOSIS: Cecum to Sigmoid [name]. ICD9: Diverticulosis: 562. 10. Comments: Diffuse diverticulosis.  NORMAL EXAM: Cecum.   Assessment Abnormal examination, see findings above.  Diagnoses: 562.10: Diverticulosis.   Events  Unplanned Interventions: No intervention was required.  Unplanned Events: There were no complications. Plans  Post Exam Instructions: Post sedation instructions given.  Patient Education: Patient given standard instructions for: Diverticulosis.   Disposition: After procedure patient sent to recovery. After recovery patient sent home.  Scheduling/Referral: Colonoscopy, to Barbette Hair. Arlyce Dice, MD, around Jun 07, 2016.

## 2010-05-14 NOTE — Assessment & Plan Note (Signed)
Summary: headaches/ssc   Vital Signs:  Patient profile:   70 year old male Weight:      220 pounds Temp:     97.8 degrees F oral BP sitting:   140 / 80  (left arm) Cuff size:   regular  Vitals Entered By: Kern Reap CMA (Aug 19, 2008 1:49 PM)  Reason for Visit headache  History of Present Illness: Joel [name] is a 70 year old male, who comes in today for evaluation of headaches.  He's had headaches off and on for the past two years.  We did an evaluation.  His past when her which included an MRI.  The MRI showed some evidence of old strokes.  We sat down with him and talked about what to do going.  Following in terms of reducing his risk factors for recurrent strokes.  He spent compliant with his medication however, he still continues to smoke.  Over the weekend.  He went to the emergency room for headache evaluation.  The examination was negative and he was referred back to [place] for follow-up.  He describes the headaches as constant.  The migratory sometimes a bit in the right side, sometimes in the in the left side.  A constant, dull, a two on a scale of one to 10, and they do not radiate.  He denies any neurologic symptoms.  Allergies: 1)  ! Zestril  Past History:  Past medical, surgical, family and social histories (including risk factors) reviewed, and no changes noted (except as noted below).  Past Medical History:    Reviewed history from 05/26/2007 and no changes required:    GERD    Hyperlipidemia    Hypertension    tobacco abuse    cough secondary to ACE inhibitor  Family History:    Reviewed history from 04/21/2007 and no changes required:       father died at 79, COPD smoker       mom died at 89 breast cancer       5 brothers, one who died at 19 months of age and the other died age 23.  The 3 in good health       3 sisters in good health  Social History:    Reviewed history from 10/11/2007 and no changes required:       Single       Current Smoker  Alcohol use-no       Drug use-no       Regular exercise-no       Retired  Review of Systems      See HPI  Physical Exam  General:  Well-developed,well-nourished,in no acute distress; alert,appropriate and cooperative throughout examination Head:  Normocephalic and atraumatic without obvious abnormalities. No apparent alopecia or balding. Eyes:  No corneal or conjunctival inflammation noted. EOMI. Perrla. Funduscopic exam benign, without hemorrhages, exudates or papilledema. Vision grossly normal. Ears:  External ear exam shows no significant lesions or deformities.  Otoscopic examination reveals clear canals, tympanic membranes are intact bilaterally without bulging, retraction, inflammation or discharge. Hearing is grossly normal bilaterally. Nose:  External nasal examination shows no deformity or inflammation. Nasal mucosa are pink and moist without lesions or exudates. Mouth:  Oral mucosa and oropharynx without lesions or exudates.  Teeth in good repair. Neurologic:  No cranial nerve deficits noted. Station and gait are normal. Plantar reflexes are down-going bilaterally. DTRs are symmetrical throughout. Sensory, motor and coordinative functions appear intact.   Impression & Recommendations:  Problem # 1:  TOBACCO ABUSE (  ICD-305.1) Assessment Unchanged  Problem # 2:  HEADACHE (ICD-784.0) Assessment: New  His updated medication list for this problem includes:    Atenolol 50 Mg Tabs (Atenolol) .Marland Kitchen... 1 and 1/2 tasbs qam    Darvocet A500 100-500 Mg Tabs (Propoxyphene n-apap) ..... One by mouth three times a day as needed headaches  Orders: Neurology Referral (Neuro)  Complete Medication List: 1)  Omeprazole 20 Mg Tbec (Omeprazole) .... Qs 2)  Hydrochlorothiazide 25 Mg Tabs (Hydrochlorothiazide) .... Take 1 tablet by mouth once a day 3)  Atenolol 50 Mg Tabs (Atenolol) .Marland Kitchen.. 1 and 1/2 tasbs qam 4)  Benicar 40 Mg Tabs (Olmesartan medoxomil) .... Take 1 tablet by mouth every  morning 5)  Aggrenox 25-200 Mg Xr12h-cap (Aspirin-dipyridamole) .... Take 1 tablet by mouth every morning 6)  Zocor 20 Mg Tabs (Simvastatin) .Marland Kitchen.. 1 tab @ bedtime 7)  Darvocet A500 100-500 Mg Tabs (Propoxyphene n-apap) .... One by mouth three times a day as needed headaches  Patient Instructions: 1)  take Motrin, 600 mg 3 times a day or Darvocet-N 101 or two tabs 3 times a day.  We will call [place].  Neurologic to get you  set up to see one of the specialist for evaluation.  In the meantime, it would be in your best interest to stop smoking completely

## 2010-05-14 NOTE — Assessment & Plan Note (Signed)
Summary: 1 month rov/njr   Vital Signs:  Patient profile:   70 year old male Height:      70 inches Weight:      225 pounds BMI:     32.40 BP sitting:   120 / 70  (left arm) Cuff size:   regular  Vitals Entered By: Kern Reap CMA (June 25, 2008 11:34 AM)  Reason for Visit follow up office visit  History of Present Illness: Nuh is a 70 year old male, who comes in today for follow-up of a TIA.  He had a TIA and was seen in emergent for evaluation.  Follow-up Doppler of his carotid arteries showed mild carotid disease in the range of 40%.  He is here today to discuss her port and to talk about risk factors.  His risk factors are all well controlled except he continues to smoke.  He states he cut his cigarette consumption from 20 a day, down to 5.  We discussed certain math is to decrease to zero patient would like to taper on his own and not take any supplemental medication.  Problems Prior to Update: 1)  Tia  (ICD-435.9) 2)  Shoulder Pain, Left  (ICD-719.41) 3)  Cough Due To Ace Inhibitors  (ICD-786.2) 4)  Tobacco Abuse  (ICD-305.1) 5)  Hypertension  (ICD-401.9) 6)  Hyperlipidemia  (ICD-272.4) 7)  Gerd  (ICD-530.81)  Medications Prior to Update: 1)  Omeprazole 20 Mg  Tbec (Omeprazole) .... Qs 2)  Hydrochlorothiazide 25 Mg  Tabs (Hydrochlorothiazide) .... Take 1 Tablet By Mouth Once A Day 3)  Atenolol 50 Mg  Tabs (Atenolol) .Marland Kitchen.. 1 and 1/2 Tasbs Qam 4)  Benicar 40 Mg  Tabs (Olmesartan Medoxomil) .... Take 1 Tablet By Mouth Every Morning 5)  Aggrenox 25-200 Mg Xr12h-Cap (Aspirin-Dipyridamole) .Marland Kitchen.. 1 Bid 6)  Zocor 20 Mg Tabs (Simvastatin) .Marland Kitchen.. 1 Tab @ Bedtime 7)  Darvocet A500 100-500 Mg Tabs (Propoxyphene N-Apap) .... One By Mouth 6 Hrs As Needed Pain  Allergies: 1)  ! Zestril PMH-FH-SH reviewed-no changes except otherwise noted, PMH-FH-SH reviewed for relevance, SH/Risk Factors reviewed for relevance  Family History:    Reviewed history from 04/21/2007 and no changes  required:       father died at 28, COPD smoker       mom died at 51 breast cancer       5 brothers, one who died at 32 months of age and the other died age 43.  The 3 in good health       3 sisters in good health  Social History:    Reviewed history from 10/11/2007 and no changes required:       Single       Current Smoker       Alcohol use-no       Drug use-no       Regular exercise-no       Retired  Review of Systems      See HPI  Physical Exam  General:  Well-developed,well-nourished,in no acute distress; alert,appropriate and cooperative throughout examination Neurologic:  No cranial nerve deficits noted. Station and gait are normal. Plantar reflexes are down-going bilaterally. DTRs are symmetrical throughout. Sensory, motor and coordinative functions appear intact.   Impression & Recommendations:  Problem # 1:  TIA (ICD-435.9) Assessment Improved  His updated medication list for this problem includes:    Aggrenox 25-200 Mg Xr12h-cap (Aspirin-dipyridamole) .Marland Kitchen... Take 1 tablet by mouth every morning  Problem # 2:  TOBACCO ABUSE (ICD-305.1) Assessment: Improved  Complete Medication List: 1)  Omeprazole 20 Mg Tbec (Omeprazole) .... Qs 2)  Hydrochlorothiazide 25 Mg Tabs (Hydrochlorothiazide) .... Take 1 tablet by mouth once a day 3)  Atenolol 50 Mg Tabs (Atenolol) .Marland Kitchen.. 1 and 1/2 tasbs qam 4)  Benicar 40 Mg Tabs (Olmesartan medoxomil) .... Take 1 tablet by mouth every morning 5)  Aggrenox 25-200 Mg Xr12h-cap (Aspirin-dipyridamole) .... Take 1 tablet by mouth every morning 6)  Zocor 20 Mg Tabs (Simvastatin) .Marland Kitchen.. 1 tab @ bedtime 7)  Darvocet A500 100-500 Mg Tabs (Propoxyphene n-apap) .... One by mouth 6 hrs as needed pain  Patient Instructions: 1)  taper your cigarette consumption by one per week and to get to zero.  Continue other medications except decrease the Aggrenox to one tablet daily.  Return in the second week in May for complete evaluation 2)  cbc, lipid panel, u/a  ,tsh level, liver fcn panel,bmet, and psa prior to next visit (v70.0)   272.0, 401.0  Prescriptions: AGGRENOX 25-200 MG XR12H-CAP (ASPIRIN-DIPYRIDAMOLE) Take 1 tablet by mouth every morning  #100 x 3   Entered and Authorized by:   Roderick Pee MD   Signed by:   Roderick Pee MD on 06/25/2008   Method used:   Electronically to        [facility]  [id number] (retail)       [address] [place]  [address]       [place], [place]  [address]       Ph: [phone] or [phone]       Fax: [phone]   RxID:   [id number]

## 2010-05-14 NOTE — Assessment & Plan Note (Signed)
Summary: 2 month rov/njr   Vital Signs:  Patient Profile:   70 Years Old Male Weight:      228 pounds Temp:     97.9 degrees F oral BP sitting:   138 / 80  (left arm)  Vitals Entered By: r vereen                 Chief Complaint:  follow up bp meds.  History of Present Illness: Joel [name] is a 70 year old male, who comes in today for reevaluation of hypertension, coughing, and ongoing tobacco abuse.  He was seen a couple weeks ago with a cough.  At that time, we felt was most likely related to a combination of things.  One of which could be the Zestril.  We therefore stopped the Zestril and he comes back today saying his cough is much improved.  He is down to 8 cigarettes a day.  Is trying to taper.  We switched him to Benicar 40 mg daily, Lopressor, trying 135/85.  As noted above.  He is trying a tapering program with the smoking.  His last chest x-ray was 3 years ago.  Advised to get a chest x-ray now    Current Allergies: ! ZESTRIL  Past Medical History:    Reviewed history from 05/26/2007 and no changes required:       GERD       Hyperlipidemia       Hypertension       tobacco abuse       cough secondary to ACE inhibitor   Social History:    Reviewed history from 04/21/2007 and no changes required:       Single       Current Smoker       Alcohol use-no       Drug use-no       Regular exercise-no   Risk Factors:     Counseled to quit/cut down tobacco use:  yes   Review of Systems      See HPI   Physical Exam  General:     Well-developed,well-nourished,in no acute distress; alert,appropriate and cooperative throughout examination Lungs:     some decrease in breath sounds bilaterally.  No wheezing    Impression & Recommendations:  Problem # 1:  COUGH DUE TO ACE INHIBITORS (ICD-786.2) Assessment: Improved  Problem # 2:  TOBACCO ABUSE (ICD-305.1) Assessment: Improved  Orders: T-2 View CXR, Same Day (71020.5TC)   Problem # 3:  HYPERTENSION  (ICD-401.9) Assessment: Improved  His updated medication list for this problem includes:    Hydrochlorothiazide 25 Mg Tabs (Hydrochlorothiazide) .Marland Kitchen... Take 1 tablet by mouth once a day    Atenolol 50 Mg Tabs (Atenolol) .Marland Kitchen... 1 and 1/2 tasbs qam    Benicar 40 Mg Tabs (Olmesartan medoxomil) .Marland Kitchen... Take 1 tablet by mouth every morning   Complete Medication List: 1)  Omeprazole 20 Mg Tbec (Omeprazole) .... Qs 2)  Hydrochlorothiazide 25 Mg Tabs (Hydrochlorothiazide) .... Take 1 tablet by mouth once a day 3)  Atenolol 50 Mg Tabs (Atenolol) .Marland Kitchen.. 1 and 1/2 tasbs qam 4)  Bl Aspirin 325 Mg Tabs (Aspirin) .... Once daily 5)  Benicar 40 Mg Tabs (Olmesartan medoxomil) .... Take 1 tablet by mouth every morning   Patient Instructions: 1)  continue taping the cigarettes by one per week.  Therefore, in the next 7 weeks UB Nx, smoker. 2)  Note to the room [place] office some time Monday through Friday 9 a.m. to 4 p.m. he did  not need an appointment and get a chest x-ray.  I will call you in a week with the report. 3)  If you continue to do well then we will see her once a year when you are due to have her annual exam   Prescriptions: BENICAR 40 MG  TABS (OLMESARTAN MEDOXOMIL) Take 1 tablet by mouth every morning  #100 x 4   Entered and Authorized by:   Roderick Pee MD   Signed by:   Roderick Pee MD on 08/22/2007   Method used:   Print then Give to Patient   RxID:   [id number] ATENOLOL 50 MG  TABS (ATENOLOL) 1 and 1/2 tasbs qam  #150 x 4   Entered and Authorized by:   Roderick Pee MD   Signed by:   Roderick Pee MD on 08/22/2007   Method used:   Print then Give to Patient   RxID:   [id number] HYDROCHLOROTHIAZIDE 25 MG  TABS (HYDROCHLOROTHIAZIDE) Take 1 tablet by mouth once a day  #100 x 4   Entered and Authorized by:   Roderick Pee MD   Signed by:   Roderick Pee MD on 08/22/2007   Method used:   Print then Give to Patient   RxID:   [id number] OMEPRAZOLE 20 MG  TBEC  (OMEPRAZOLE) qs  #100 x 4   Entered and Authorized by:   Roderick Pee MD   Signed by:   Roderick Pee MD on 08/22/2007   Method used:   Print then Give to Patient   RxID:   [id number]  ]

## 2010-05-14 NOTE — Letter (Signed)
Summary: Cardiac Catheterization Instructions- [facility]  [facility], Main Office  [address]   [place], [place] [address]   Phone: [phone]  Fax: [phone]     09/13/2008 MRN: [id number]  EERO DINI [address] [place], [place]  [address]  Dear Mr. LUCE,   You are scheduled for a Cardiac Catheterization on Thur 6/10 with  Dr. Excell Seltzer  Please arrive to the 1st floor of the Heart and Vascular Center at [facility] at 9:30 am / pm on the day of your procedure. Please do not arrive before 6:30 a.m. Call the Heart and Vascular Center at [phone] if you are unable to make your appointmnet. The Code to get into the parking garage under the building is 0700. Take the elevators to the 1st floor. You must have someone to drive you home. Someone must be with you for the first 24 hours after you arrive home. Please wear clothes that are easy to get on and off and wear slip-on shoes. Do not eat or drink after midnight except water with your medications that morning. Bring all your medications and current insurance cards with you.  _X__ DO NOT take these medications before your procedure: ________HCTZ________________________________________________________  ___ Make sure you take your aspirin.  ___ You may take ALL of your medications with water that morning. ________________________________________________________________________________________________________________________________  ___ DO NOT take ANY medications before your procedure.  ___ Pre-med instructions:  ________________________________________________________________________________________________________________________________  The usual length of stay after your procedure is 2 to 3 hours. This can vary.  If you have any questions, please call the office at the number listed above.   Meredith Staggers, RN

## 2010-05-14 NOTE — Progress Notes (Signed)
Summary: pt is having headaches and would like something for pain   Phone Note Call from Patient Call back at Home Phone [phone]   Caller: pt live Call For: todd Summary of Call: patient is having headaches and would like something for pain.  [facility] [address]   Initial call taken by: Roselle Locus,  June 13, 2008 10:06 AM    generic darvocet n100  #40 one every 6 hours for pain

## 2010-05-14 NOTE — Assessment & Plan Note (Signed)
Summary: fell on shoulder, pain/jls   Vital Signs:  Patient Profile:   70 Years Old Male Weight:      230 pounds Temp:     98 degrees F Pulse rate:   70 / minute Resp:     12 per minute BP sitting:   110 / 78  (left arm) Cuff size:   regular  Pt. in pain?   no                  Chief Complaint:  left shoulder pain.  History of Present Illness: Joel [name]  is a 70 year old male who comes in today for evaluation of a possible TIA.  He was seen yesterday in the emergency room.  He had a 5-hour stay diagnostic mega-workup.  His symptoms are that of numbness in his left arm.  CT brain scan.  All labs were normal.  He was advised to get further diagnostic studies.  His risk factors are hypertension, for which he takes HCTZ, 25 mg q.a.m., atenolol 75 mg q.a.m., and Benicar 40 mg q.a.m.  BP 110/78.  He is also on cholesterol-lowering drugs.  Nightly.  His blood sugar in the past as been normal.  He continues to smoke.  We discussed the risk factors for TIAs and the fact that it is an impending stroke syndrome.  It will be important for him to prevent a stroke to keep his weight down.  Exercise keep his blood pressure cholesterol normal and he must stop smoking completely    Current Allergies: ! ZESTRIL  Past Medical History:    Reviewed history from 05/26/2007 and no changes required:       GERD       Hyperlipidemia       Hypertension       tobacco abuse       cough secondary to ACE inhibitor   Family History:    Reviewed history from 04/21/2007 and no changes required:       father died at 57, COPD smoker       mom died at 27 breast cancer       5 brothers, one who died at 65 months of age and the other died age 33.  The 3 in good health       3 sisters in good health  Social History:    Reviewed history from 10/11/2007 and no changes required:       Single       Current Smoker       Alcohol use-no       Drug use-no       Regular exercise-no       Retired   Risk  Factors:     Counseled to quit/cut down tobacco use:  yes   Review of Systems      See HPI   Physical Exam  General:     Well-developed,well-nourished,in no acute distress; alert,appropriate and cooperative throughout examination Head:     Normocephalic and atraumatic without obvious abnormalities. No apparent alopecia or balding. Eyes:     No corneal or conjunctival inflammation noted. EOMI. Perrla. Funduscopic exam benign, without hemorrhages, exudates or papilledema. Vision grossly normal. Ears:     External ear exam shows no significant lesions or deformities.  Otoscopic examination reveals clear canals, tympanic membranes are intact bilaterally without bulging, retraction, inflammation or discharge. Hearing is grossly normal bilaterally. Nose:     External nasal examination shows no deformity or inflammation. Nasal mucosa are pink  and moist without lesions or exudates. Mouth:     Oral mucosa and oropharynx without lesions or exudates.  Teeth in good repair. Msk:     No deformity or scoliosis noted of thoracic or lumbar spine.   Pulses:     R and L carotid,radial,femoral,dorsalis pedis and posterior tibial pulses are full and equal bilaterally Extremities:     No clubbing, cyanosis, edema, or deformity noted with normal full range of motion of all joints.   Neurologic:     No cranial nerve deficits noted. Station and gait are normal. Plantar reflexes are down-going bilaterally. DTRs are symmetrical throughout. Sensory, motor and coordinative functions appear intact.    Impression & Recommendations:  Problem # 1:  TIA (ICD-435.9) Assessment: Unchanged  His updated medication list for this problem includes:    Aggrenox 25-200 Mg Xr12h-cap (Aspirin-dipyridamole) .Marland Kitchen... 1 bid  Orders: Radiology Referral (Radiology) Doppler Referral (Doppler)   Complete Medication List: 1)  Omeprazole 20 Mg Tbec (Omeprazole) .... Qs 2)  Hydrochlorothiazide 25 Mg Tabs  (Hydrochlorothiazide) .... Take 1 tablet by mouth once a day 3)  Atenolol 50 Mg Tabs (Atenolol) .Marland Kitchen.. 1 and 1/2 tasbs qam 4)  Benicar 40 Mg Tabs (Olmesartan medoxomil) .... Take 1 tablet by mouth every morning 5)  Aggrenox 25-200 Mg Xr12h-cap (Aspirin-dipyridamole) .Marland Kitchen.. 1 bid 6)  Zocor 20 Mg Tabs (Simvastatin) .Marland Kitchen.. 1 tab @ bedtime   Patient Instructions: 1)  he must stop smoking completely.  Continue current medications.  We will get to set up for further diagnostic studies.  Return March 16 for follow-up   Prescriptions: ZOCOR 20 MG TABS (SIMVASTATIN) 1 tab @ bedtime  #100 x 3   Entered and Authorized by:   Roderick Pee MD   Signed by:   Roderick Pee MD on 06/06/2008   Method used:   Electronically to        [facility]  [address] [facility] [id number] (retail)       [address]       [place], [place]  [address]       Ph: [phone] or [phone]       Fax: [phone]   RxID:   [id number]

## 2010-05-27 NOTE — Consult Note (Signed)
NAME:  GERRON, GUIDOTTI NO.:  [id number]  MEDICAL RECORD NO.:  [id number]          PATIENT TYPE:  EMS  LOCATION:  [facility]                         FACILITY:  [facility]  PHYSICIAN:  Karn Cassis, MD  DATE OF BIRTH:  07-31-1940  DATE OF CONSULTATION: DATE OF DISCHARGE:  04/18/2010                                CONSULTATION   PRIMARY CARE PHYSICIAN:  Deirdre Peer. Polite, MD  REASON FOR CONSULTATION:  We are seeing Mr. Chap in consultation with the emergency department for evaluation and management of chest pain.  HISTORY OF PRESENT ILLNESS:  Mr. Brem is a 70 year old male with history of peripheral vascular disease, hypertension, hyperlipidemia, TIA, iliac aneurysm status post stent, iron deficiency anemia who presented to the emergency department with chest pain.  The patient reports that the chest pain started at 10:00 p.m. on April 17, 2008.  The pain is ranked 10/10 and lasted up until 12:30 a.m. when he arrived in the emergency department and received nitroglycerin.  The patient recalls that the pain was intense, and was associated with symptoms of shortness of breath.  He had absolutely no radiation of pain.  Pain was localized to the substernal area.  There were no associated symptoms of nausea, vomiting, or diaphoresis.  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  Upon arrival to the emergency department, his blood pressure was 193/116.  He was started on nitroglycerin drip and his blood pressure came down to 156/78.  Initial EKG showed normal sinus rhythm with a normal axis, heart rate of 78, and no signs of ST elevation.  He had left ventricle hypertrophy with early repolarization.  His first set of cardiac markers were negative.  He was started on heparin.  At this point, I was asked for further evaluation.  On my interview, the patient is resting comfortably, watching TV.   He has absolutely no pain.  He ranks the discomfort as 0/10.  He is interested in leaving against medical advice and does not want to be hospitalized.  REVIEW OF SYSTEMS:  A 12-point review of systems is negative except for symptoms outlined in the HPI above.  PAST MEDICAL HISTORY: 1. Hypertension. 2. Hyperlipidemia. 3. Gastroesophageal reflux disease. 4. Aortoiliac peripheral vessel disease status post stent. 5. History of colonic diverticulosis. 6. History of TIA. 7. History of alcohol abuse but quit in 1995.  CURRENT MEDICATIONS: 1. Aspirin 325 mg once a day. 2. Hydrochlorothiazide once a day, unknown dose. 3. Omeprazole 20 mg once a day. 4. Simvastatin unknown dose once a day. 5. Iron replacement tablets once a day. 6. Olmesartan 20 mg once a day.  FAMILY HISTORY:  Negative for history of premature coronary disease.  SOCIAL HISTORY:  He works as a Curator.  He lives alone.  He has history of smoking and a remote history of alcohol abuse.  PHYSICAL EXAMINATION:  VITAL SIGNS:  His blood pressure currently is 156/78, heart rate is 78, respiratory rate 17, saturating 96%. GENERAL:  Well-appearing male in no apparent distress. CARDIOVASCULAR:  Regular rate and rhythm.  Normal S1, S2.  LUNGS:  Clear to auscultation bilaterally. ABDOMEN:  Soft, nontender, nondistended.  Normal bowel sounds. EXTREMITIES:  No signs of clubbing, cyanosis, or edema. PSYCH:  Alert and oriented to time, place, and person. SKIN:  No rashes or lesions.  LABORATORY DATA:  His CBC is unremarkable except for hematocrit of 38.7 with MCV of 69.  His chemistry is unremarkable.  His creatinine is 1.0. First set of cardiac markers are negative.  EKG is outlined above.  ASSESSMENT/PLAN:  In summary, this is a 70 year old male with history of hypertension, hyperlipidemia, and peripheral vascular disease who came to the emergency department with chest pain.  His initial blood pressure was found to be  193/116, started on nitroglycerin drip and came down to 156/78.  He is currently chest pain free.  I was asked for further evaluation and management of chest pain. 1. Chest pain:  Given the patient's history of peripheral vascular     disease and multiple risk factors, he has high risk of having     coronary artery disease.  I recommended that the patient be     admitted for serial cardiac markers, continuation of heparin drip,     and further evaluation and treatment of potential cardiac disease     including but not limited to potential stress test or cardiac     catheterization as needed based upon his workup.  He also needs to     be better medically managed with addition of antiplatelet agents,     beta-blockers, and potentially ACE inhibitors.  He is adamant     against being hospitalized.  He wants to leave against medical     advice.  He understands the risks of leaving the hospital against     medical advice which include recurrent symptoms, myocardial     infarction, stroke, and death.  The patient was informed of the     benefits of being hospitalized and risks of leaving against medical     advice.  He has opted to leave the hospital.  He signed an AMA     form.     Karn Cassis, MD     PV/MEDQ  D:  04/18/2010  T:  04/18/2010  Job:  [id number]  Electronically Signed by Karn Cassis MD on 05/27/2010 01:18:36 PM

## 2010-06-28 LAB — URINALYSIS, ROUTINE W REFLEX MICROSCOPIC
Bilirubin Urine: NEGATIVE
Glucose, UA: NEGATIVE mg/dL
Hgb urine dipstick: NEGATIVE
Ketones, ur: NEGATIVE mg/dL
Nitrite: NEGATIVE
Protein, ur: NEGATIVE mg/dL
Specific Gravity, Urine: 1.012 (ref 1.005–1.030)
Urobilinogen, UA: 0.2 mg/dL (ref 0.0–1.0)
pH: 6 (ref 5.0–8.0)

## 2010-06-28 LAB — CROSSMATCH
ABO/RH(D): AB POS
Antibody Screen: NEGATIVE

## 2010-06-28 LAB — CBC
HCT: 26.6 % — ABNORMAL LOW (ref 39.0–52.0)
HCT: 27.6 % — ABNORMAL LOW (ref 39.0–52.0)
HCT: 29 % — ABNORMAL LOW (ref 39.0–52.0)
Hemoglobin: 8.7 g/dL — ABNORMAL LOW (ref 13.0–17.0)
Hemoglobin: 9 g/dL — ABNORMAL LOW (ref 13.0–17.0)
Hemoglobin: 9.4 g/dL — ABNORMAL LOW (ref 13.0–17.0)
MCH: 23.6 pg — ABNORMAL LOW (ref 26.0–34.0)
MCH: 23.8 pg — ABNORMAL LOW (ref 26.0–34.0)
MCHC: 32.3 g/dL (ref 30.0–36.0)
MCHC: 32.5 g/dL (ref 30.0–36.0)
MCHC: 32.8 g/dL (ref 30.0–36.0)
MCV: 72.4 fL — ABNORMAL LOW (ref 78.0–100.0)
MCV: 72.5 fL — ABNORMAL LOW (ref 78.0–100.0)
MCV: 72.7 fL — ABNORMAL LOW (ref 78.0–100.0)
Platelets: 195 10*3/uL (ref 150–400)
Platelets: 224 10*3/uL (ref 150–400)
Platelets: 247 10*3/uL (ref 150–400)
RBC: 3.68 MIL/uL — ABNORMAL LOW (ref 4.22–5.81)
RBC: 3.8 MIL/uL — ABNORMAL LOW (ref 4.22–5.81)
RBC: 3.99 MIL/uL — ABNORMAL LOW (ref 4.22–5.81)
RDW: 21.9 % — ABNORMAL HIGH (ref 11.5–15.5)
RDW: 21.9 % — ABNORMAL HIGH (ref 11.5–15.5)
RDW: 21.9 % — ABNORMAL HIGH (ref 11.5–15.5)
WBC: 7.7 10*3/uL (ref 4.0–10.5)
WBC: 7.8 10*3/uL (ref 4.0–10.5)
WBC: 8.1 10*3/uL (ref 4.0–10.5)

## 2010-06-28 LAB — COMPREHENSIVE METABOLIC PANEL
ALT: 22 U/L (ref 0–53)
AST: 12 U/L (ref 0–37)
Albumin: 3.4 g/dL — ABNORMAL LOW (ref 3.5–5.2)
Alkaline Phosphatase: 67 U/L (ref 39–117)
BUN: 20 mg/dL (ref 6–23)
CO2: 24 mEq/L (ref 19–32)
Calcium: 10 mg/dL (ref 8.4–10.5)
Chloride: 106 mEq/L (ref 96–112)
Creatinine, Ser: 1.22 mg/dL (ref 0.4–1.5)
GFR calc Af Amer: >60 mL/min (ref >60)
GFR calc non Af Amer: 59 mL/min — ABNORMAL LOW (ref >60)
Glucose, Bld: 110 mg/dL — ABNORMAL HIGH (ref 70–99)
Potassium: 4.9 mEq/L (ref 3.5–5.1)
Sodium: 138 mEq/L (ref 135–145)
Total Bilirubin: 0.2 mg/dL — ABNORMAL LOW (ref 0.3–1.2)
Total Protein: 7 g/dL (ref 6.0–8.3)

## 2010-06-28 LAB — ABO/RH: ABO/RH(D): AB POS

## 2010-06-28 LAB — APTT: aPTT: 32 seconds (ref 24–37)

## 2010-06-28 LAB — BASIC METABOLIC PANEL
BUN: 12 mg/dL (ref 6–23)
CO2: 24 mEq/L (ref 19–32)
Calcium: 9.2 mg/dL (ref 8.4–10.5)
Chloride: 109 mEq/L (ref 96–112)
Creatinine, Ser: 1.08 mg/dL (ref 0.4–1.5)
GFR calc Af Amer: >60 mL/min (ref >60)
GFR calc non Af Amer: >60 mL/min (ref >60)
Glucose, Bld: 103 mg/dL — ABNORMAL HIGH (ref 70–99)
Potassium: 4.5 mEq/L (ref 3.5–5.1)
Sodium: 137 mEq/L (ref 135–145)

## 2010-06-28 LAB — TYPE AND SCREEN
ABO/RH(D): AB POS
Antibody Screen: NEGATIVE

## 2010-06-28 LAB — PROTIME-INR
INR: 1.07 (ref 0.00–1.49)
Prothrombin Time: 13.8 seconds (ref 11.6–15.2)

## 2010-06-28 LAB — MRSA PCR SCREENING: MRSA by PCR: NEGATIVE

## 2010-06-29 LAB — COMPREHENSIVE METABOLIC PANEL
ALT: 25 U/L (ref 0–53)
AST: 18 U/L (ref 0–37)
Albumin: 2.8 g/dL — ABNORMAL LOW (ref 3.5–5.2)
Alkaline Phosphatase: 72 U/L (ref 39–117)
BUN: 13 mg/dL (ref 6–23)
CO2: 24 mEq/L (ref 19–32)
Calcium: 9.1 mg/dL (ref 8.4–10.5)
Chloride: 103 mEq/L (ref 96–112)
Creatinine, Ser: 1.41 mg/dL (ref 0.4–1.5)
GFR calc Af Amer: >60 mL/min (ref >60)
GFR calc non Af Amer: 50 mL/min — ABNORMAL LOW (ref >60)
Glucose, Bld: 119 mg/dL — ABNORMAL HIGH (ref 70–99)
Potassium: 4.1 mEq/L (ref 3.5–5.1)
Sodium: 136 mEq/L (ref 135–145)
Total Bilirubin: 0.2 mg/dL — ABNORMAL LOW (ref 0.3–1.2)
Total Protein: 7 g/dL (ref 6.0–8.3)

## 2010-06-29 LAB — MAGNESIUM: Magnesium: 2.2 mg/dL (ref 1.5–2.5)

## 2010-06-29 LAB — PROTIME-INR
INR: 1.15 (ref 0.00–1.49)
INR: 2.22 — ABNORMAL HIGH (ref 0.00–1.49)
INR: 2.45 — ABNORMAL HIGH (ref 0.00–1.49)
Prothrombin Time: 14.6 seconds (ref 11.6–15.2)
Prothrombin Time: 24.4 seconds — ABNORMAL HIGH (ref 11.6–15.2)
Prothrombin Time: 26.4 seconds — ABNORMAL HIGH (ref 11.6–15.2)

## 2010-06-29 LAB — CANCER ANTIGEN 19-9: CA 19-9: <1.2 U/mL — ABNORMAL LOW (ref <35.0)

## 2010-06-29 LAB — URINALYSIS, ROUTINE W REFLEX MICROSCOPIC
Bilirubin Urine: NEGATIVE
Glucose, UA: NEGATIVE mg/dL
Hgb urine dipstick: NEGATIVE
Ketones, ur: NEGATIVE mg/dL
Nitrite: NEGATIVE
Protein, ur: NEGATIVE mg/dL
Specific Gravity, Urine: 1.016 (ref 1.005–1.030)
Urobilinogen, UA: 0.2 mg/dL (ref 0.0–1.0)
pH: 6 (ref 5.0–8.0)

## 2010-06-29 LAB — CARDIAC PANEL(CRET KIN+CKTOT+MB+TROPI)
CK, MB: 1.3 ng/mL (ref 0.3–4.0)
CK, MB: 1.3 ng/mL (ref 0.3–4.0)
Relative Index: INVALID (ref 0.0–2.5)
Relative Index: INVALID (ref 0.0–2.5)
Total CK: 83 U/L (ref 7–232)
Total CK: 83 U/L (ref 7–232)
Troponin I: 0.01 ng/mL (ref 0.00–0.06)
Troponin I: 0.02 ng/mL (ref 0.00–0.06)

## 2010-06-29 LAB — CEA: CEA: 0.9 ng/mL (ref 0.0–5.0)

## 2010-06-29 LAB — ABO/RH: ABO/RH(D): AB POS

## 2010-06-29 LAB — POCT I-STAT, CHEM 8
BUN: 20 mg/dL (ref 6–23)
Calcium, Ion: 1.22 mmol/L (ref 1.12–1.32)
Chloride: 111 mEq/L (ref 96–112)
Creatinine, Ser: 1.2 mg/dL (ref 0.4–1.5)
Glucose, Bld: 106 mg/dL — ABNORMAL HIGH (ref 70–99)
HCT: 30 % — ABNORMAL LOW (ref 39.0–52.0)
Hemoglobin: 10.2 g/dL — ABNORMAL LOW (ref 13.0–17.0)
Potassium: 4.4 mEq/L (ref 3.5–5.1)
Sodium: 140 mEq/L (ref 135–145)
TCO2: 21 mmol/L (ref 0–100)

## 2010-06-29 LAB — CROSSMATCH
ABO/RH(D): AB POS
Antibody Screen: NEGATIVE

## 2010-06-29 LAB — RAPID URINE DRUG SCREEN, HOSP PERFORMED
Amphetamines: NOT DETECTED
Barbiturates: NOT DETECTED
Benzodiazepines: NOT DETECTED
Cocaine: NOT DETECTED
Opiates: NOT DETECTED
Tetrahydrocannabinol: NOT DETECTED

## 2010-06-29 LAB — CBC
HCT: 26.2 % — ABNORMAL LOW (ref 39.0–52.0)
Hemoglobin: 8.1 g/dL — ABNORMAL LOW (ref 13.0–17.0)
MCHC: 31 g/dL (ref 30.0–36.0)
MCV: 71.2 fL — ABNORMAL LOW (ref 78.0–100.0)
Platelets: 361 10*3/uL (ref 150–400)
RBC: 3.69 MIL/uL — ABNORMAL LOW (ref 4.22–5.81)
RDW: 15.9 % — ABNORMAL HIGH (ref 11.5–15.5)
WBC: 5.9 10*3/uL (ref 4.0–10.5)

## 2010-06-29 LAB — AMYLASE: Amylase: 134 U/L — ABNORMAL HIGH (ref 0–105)

## 2010-06-29 LAB — LIPID PANEL
Cholesterol: 135 mg/dL (ref 0–200)
HDL: 23 mg/dL — ABNORMAL LOW (ref >39)
LDL Cholesterol: 89 mg/dL (ref 0–99)
Total CHOL/HDL Ratio: 5.9 RATIO
Triglycerides: 116 mg/dL (ref <150)
VLDL: 23 mg/dL (ref 0–40)

## 2010-06-29 LAB — LIPASE, BLOOD: Lipase: 106 U/L — ABNORMAL HIGH (ref 11–59)

## 2010-06-29 LAB — TSH: TSH: 0.888 u[IU]/mL (ref 0.350–4.500)

## 2010-06-29 LAB — URINE CULTURE
Colony Count: >=100000
Special Requests: NEGATIVE

## 2010-06-30 LAB — COMPREHENSIVE METABOLIC PANEL
ALT: 25 U/L (ref 0–53)
AST: 20 U/L (ref 0–37)
Albumin: 3.1 g/dL — ABNORMAL LOW (ref 3.5–5.2)
Alkaline Phosphatase: 80 U/L (ref 39–117)
BUN: 16 mg/dL (ref 6–23)
CO2: 25 mEq/L (ref 19–32)
Calcium: 9.4 mg/dL (ref 8.4–10.5)
Chloride: 95 mEq/L — ABNORMAL LOW (ref 96–112)
Creatinine, Ser: 1.52 mg/dL — ABNORMAL HIGH (ref 0.4–1.5)
GFR calc Af Amer: 55 mL/min — ABNORMAL LOW (ref >60)
GFR calc non Af Amer: 46 mL/min — ABNORMAL LOW (ref >60)
Glucose, Bld: 146 mg/dL — ABNORMAL HIGH (ref 70–99)
Potassium: 3.9 mEq/L (ref 3.5–5.1)
Sodium: 132 mEq/L — ABNORMAL LOW (ref 135–145)
Total Bilirubin: 0.2 mg/dL — ABNORMAL LOW (ref 0.3–1.2)
Total Protein: 7.7 g/dL (ref 6.0–8.3)

## 2010-06-30 LAB — DIFFERENTIAL
Basophils Absolute: 0 10*3/uL (ref 0.0–0.1)
Basophils Relative: 0 % (ref 0–1)
Eosinophils Absolute: 0.1 10*3/uL (ref 0.0–0.7)
Eosinophils Relative: 2 % (ref 0–5)
Lymphocytes Relative: 15 % (ref 12–46)
Lymphs Abs: 0.9 10*3/uL (ref 0.7–4.0)
Monocytes Absolute: 0.4 10*3/uL (ref 0.1–1.0)
Monocytes Relative: 7 % (ref 3–12)
Neutro Abs: 4.7 10*3/uL (ref 1.7–7.7)
Neutrophils Relative %: 76 % (ref 43–77)

## 2010-06-30 LAB — CARDIAC PANEL(CRET KIN+CKTOT+MB+TROPI)
CK, MB: 1.5 ng/mL (ref 0.3–4.0)
Relative Index: INVALID (ref 0.0–2.5)
Total CK: 81 U/L (ref 7–232)
Troponin I: 0.02 ng/mL (ref 0.00–0.06)

## 2010-06-30 LAB — BRAIN NATRIURETIC PEPTIDE: Pro B Natriuretic peptide (BNP): <30 pg/mL (ref 0.0–100.0)

## 2010-06-30 LAB — PROTIME-INR
INR: 2.04 — ABNORMAL HIGH (ref 0.00–1.49)
Prothrombin Time: 22.9 seconds — ABNORMAL HIGH (ref 11.6–15.2)

## 2010-06-30 LAB — POCT CARDIAC MARKERS
CKMB, poc: 1.9 ng/mL (ref 1.0–8.0)
CKMB, poc: 2.6 ng/mL (ref 1.0–8.0)
Myoglobin, poc: 215 ng/mL (ref 12–200)
Myoglobin, poc: 257 ng/mL (ref 12–200)
Troponin i, poc: <0.05 ng/mL (ref 0.00–0.09)
Troponin i, poc: <0.05 ng/mL (ref 0.00–0.09)

## 2010-06-30 LAB — LIPASE, BLOOD: Lipase: 100 U/L — ABNORMAL HIGH (ref 11–59)

## 2010-06-30 LAB — CBC
HCT: 27.6 % — ABNORMAL LOW (ref 39.0–52.0)
Hemoglobin: 8.7 g/dL — ABNORMAL LOW (ref 13.0–17.0)
MCHC: 31.4 g/dL (ref 30.0–36.0)
MCV: 71.2 fL — ABNORMAL LOW (ref 78.0–100.0)
Platelets: 367 10*3/uL (ref 150–400)
RBC: 3.88 MIL/uL — ABNORMAL LOW (ref 4.22–5.81)
RDW: 16.2 % — ABNORMAL HIGH (ref 11.5–15.5)
WBC: 6.2 10*3/uL (ref 4.0–10.5)

## 2010-07-01 LAB — CBC
HCT: 39.1 % (ref 39.0–52.0)
HCT: 29.9 % — ABNORMAL LOW (ref 39.0–52.0)
Hemoglobin: 12.5 g/dL — ABNORMAL LOW (ref 13.0–17.0)
Hemoglobin: 9.9 g/dL — ABNORMAL LOW (ref 13.0–17.0)
MCHC: 31.9 g/dL (ref 30.0–36.0)
MCHC: 33 g/dL (ref 30.0–36.0)
MCV: 74.2 fL — ABNORMAL LOW (ref 78.0–100.0)
MCV: 73.5 fL — ABNORMAL LOW (ref 78.0–100.0)
Platelets: 160 10*3/uL (ref 150–400)
Platelets: 327 10*3/uL (ref 150–400)
RBC: 5.26 MIL/uL (ref 4.22–5.81)
RBC: 4.07 MIL/uL — ABNORMAL LOW (ref 4.22–5.81)
RDW: 16.2 % — ABNORMAL HIGH (ref 11.5–15.5)
RDW: 16.3 % — ABNORMAL HIGH (ref 11.5–15.5)
WBC: 4.9 10*3/uL (ref 4.0–10.5)
WBC: 10.1 10*3/uL (ref 4.0–10.5)

## 2010-07-01 LAB — BASIC METABOLIC PANEL
BUN: 13 mg/dL (ref 6–23)
CO2: 29 mEq/L (ref 19–32)
Calcium: 9.3 mg/dL (ref 8.4–10.5)
Chloride: 102 mEq/L (ref 96–112)
Creatinine, Ser: 1.22 mg/dL (ref 0.4–1.5)
GFR calc Af Amer: >60 mL/min (ref >60)
GFR calc non Af Amer: 59 mL/min — ABNORMAL LOW (ref >60)
Glucose, Bld: 107 mg/dL — ABNORMAL HIGH (ref 70–99)
Potassium: 4.2 mEq/L (ref 3.5–5.1)
Sodium: 137 mEq/L (ref 135–145)

## 2010-07-01 LAB — POCT CARDIAC MARKERS
CKMB, poc: 2.6 ng/mL (ref 1.0–8.0)
CKMB, poc: 3.9 ng/mL (ref 1.0–8.0)
Myoglobin, poc: 130 ng/mL (ref 12–200)
Myoglobin, poc: 145 ng/mL (ref 12–200)
Troponin i, poc: <0.05 ng/mL (ref 0.00–0.09)
Troponin i, poc: <0.05 ng/mL (ref 0.00–0.09)

## 2010-07-01 LAB — DIFFERENTIAL
Basophils Absolute: 0 10*3/uL (ref 0.0–0.1)
Basophils Absolute: 0 10*3/uL (ref 0.0–0.1)
Basophils Relative: 1 % (ref 0–1)
Basophils Relative: 0 % (ref 0–1)
Eosinophils Absolute: 0.2 10*3/uL (ref 0.0–0.7)
Eosinophils Absolute: 0.3 10*3/uL (ref 0.0–0.7)
Eosinophils Relative: 4 % (ref 0–5)
Eosinophils Relative: 3 % (ref 0–5)
Lymphocytes Relative: 24 % (ref 12–46)
Lymphocytes Relative: 12 % (ref 12–46)
Lymphs Abs: 1.2 10*3/uL (ref 0.7–4.0)
Lymphs Abs: 1.2 10*3/uL (ref 0.7–4.0)
Monocytes Absolute: 0.5 10*3/uL (ref 0.1–1.0)
Monocytes Absolute: 0.7 10*3/uL (ref 0.1–1.0)
Monocytes Relative: 10 % (ref 3–12)
Monocytes Relative: 7 % (ref 3–12)
Neutro Abs: 3 10*3/uL (ref 1.7–7.7)
Neutro Abs: 7.8 10*3/uL — ABNORMAL HIGH (ref 1.7–7.7)
Neutrophils Relative %: 61 % (ref 43–77)
Neutrophils Relative %: 78 % — ABNORMAL HIGH (ref 43–77)

## 2010-07-01 LAB — COMPREHENSIVE METABOLIC PANEL
ALT: 44 U/L (ref 0–53)
AST: 22 U/L (ref 0–37)
Albumin: 3.2 g/dL — ABNORMAL LOW (ref 3.5–5.2)
Alkaline Phosphatase: 63 U/L (ref 39–117)
BUN: 20 mg/dL (ref 6–23)
CO2: 27 mEq/L (ref 19–32)
Calcium: 8.8 mg/dL (ref 8.4–10.5)
Chloride: 99 mEq/L (ref 96–112)
Creatinine, Ser: 1.43 mg/dL (ref 0.4–1.5)
GFR calc Af Amer: 59 mL/min — ABNORMAL LOW (ref >60)
GFR calc non Af Amer: 49 mL/min — ABNORMAL LOW (ref >60)
Glucose, Bld: 114 mg/dL — ABNORMAL HIGH (ref 70–99)
Potassium: 4.5 mEq/L (ref 3.5–5.1)
Sodium: 133 mEq/L — ABNORMAL LOW (ref 135–145)
Total Bilirubin: 0.5 mg/dL (ref 0.3–1.2)
Total Protein: 7.2 g/dL (ref 6.0–8.3)

## 2010-07-01 LAB — BRAIN NATRIURETIC PEPTIDE: Pro B Natriuretic peptide (BNP): 53 pg/mL (ref 0.0–100.0)

## 2010-07-01 LAB — URIC ACID: Uric Acid, Serum: 9.4 mg/dL — ABNORMAL HIGH (ref 4.0–7.8)

## 2010-07-15 LAB — DIFFERENTIAL
Basophils Absolute: 0 10*3/uL (ref 0.0–0.1)
Basophils Relative: 1 % (ref 0–1)
Eosinophils Absolute: 0.2 10*3/uL (ref 0.0–0.7)
Eosinophils Relative: 5 % (ref 0–5)
Lymphocytes Relative: 34 % (ref 12–46)
Lymphs Abs: 1.5 10*3/uL (ref 0.7–4.0)
Monocytes Absolute: 0.4 10*3/uL (ref 0.1–1.0)
Monocytes Relative: 9 % (ref 3–12)
Neutro Abs: 2.3 10*3/uL (ref 1.7–7.7)
Neutrophils Relative %: 51 % (ref 43–77)

## 2010-07-15 LAB — D-DIMER, QUANTITATIVE: D-Dimer, Quant: 1.63 ug/mL-FEU — ABNORMAL HIGH (ref 0.00–0.48)

## 2010-07-15 LAB — POCT CARDIAC MARKERS
CKMB, poc: 2.9 ng/mL (ref 1.0–8.0)
Myoglobin, poc: 120 ng/mL (ref 12–200)
Troponin i, poc: <0.05 ng/mL (ref 0.00–0.09)

## 2010-07-15 LAB — CBC
HCT: 38.3 % — ABNORMAL LOW (ref 39.0–52.0)
Hemoglobin: 12.5 g/dL — ABNORMAL LOW (ref 13.0–17.0)
MCHC: 32.7 g/dL (ref 30.0–36.0)
MCV: 75.2 fL — ABNORMAL LOW (ref 78.0–100.0)
Platelets: 170 10*3/uL (ref 150–400)
RBC: 5.09 MIL/uL (ref 4.22–5.81)
RDW: 16.3 % — ABNORMAL HIGH (ref 11.5–15.5)
WBC: 4.4 10*3/uL (ref 4.0–10.5)

## 2010-07-15 LAB — SEDIMENTATION RATE: Sed Rate: 8 mm/hr (ref 0–16)

## 2010-07-15 LAB — POCT I-STAT, CHEM 8
BUN: 11 mg/dL (ref 6–23)
Calcium, Ion: 1.19 mmol/L (ref 1.12–1.32)
Chloride: 103 mEq/L (ref 96–112)
Creatinine, Ser: 1.2 mg/dL (ref 0.4–1.5)
Glucose, Bld: 110 mg/dL — ABNORMAL HIGH (ref 70–99)
HCT: 41 % (ref 39.0–52.0)
Hemoglobin: 13.9 g/dL (ref 13.0–17.0)
Potassium: 3.6 mEq/L (ref 3.5–5.1)
Sodium: 139 mEq/L (ref 135–145)
TCO2: 26 mmol/L (ref 0–100)

## 2010-07-21 LAB — URINALYSIS, ROUTINE W REFLEX MICROSCOPIC
Bilirubin Urine: NEGATIVE
Glucose, UA: NEGATIVE mg/dL
Hgb urine dipstick: NEGATIVE
Ketones, ur: NEGATIVE mg/dL
Nitrite: NEGATIVE
Protein, ur: NEGATIVE mg/dL
Specific Gravity, Urine: 1.006 (ref 1.005–1.030)
Urobilinogen, UA: 0.2 mg/dL (ref 0.0–1.0)
pH: 6 (ref 5.0–8.0)

## 2010-07-21 LAB — CARDIAC PANEL(CRET KIN+CKTOT+MB+TROPI)
CK, MB: 5.5 ng/mL — ABNORMAL HIGH (ref 0.3–4.0)
CK, MB: 6.3 ng/mL — ABNORMAL HIGH (ref 0.3–4.0)
CK, MB: 6.6 ng/mL — ABNORMAL HIGH (ref 0.3–4.0)
Relative Index: 1.7 (ref 0.0–2.5)
Relative Index: 1.8 (ref 0.0–2.5)
Relative Index: 1.9 (ref 0.0–2.5)
Total CK: 304 U/L — ABNORMAL HIGH (ref 7–232)
Total CK: 351 U/L — ABNORMAL HIGH (ref 7–232)
Total CK: 368 U/L — ABNORMAL HIGH (ref 7–232)
Troponin I: 0.02 ng/mL (ref 0.00–0.06)
Troponin I: 0.02 ng/mL (ref 0.00–0.06)
Troponin I: 0.02 ng/mL (ref 0.00–0.06)

## 2010-07-21 LAB — HEMOGLOBIN A1C
Hgb A1c MFr Bld: 6.1 % (ref 4.6–6.1)
Mean Plasma Glucose: 128 mg/dL

## 2010-07-21 LAB — RETICULOCYTES
RBC.: 5.68 MIL/uL (ref 4.22–5.81)
Retic Count, Absolute: 51.1 10*3/uL (ref 19.0–186.0)
Retic Ct Pct: 0.9 % (ref 0.4–3.1)

## 2010-07-21 LAB — IRON AND TIBC
Iron: 87 ug/dL (ref 42–135)
Saturation Ratios: 27 % (ref 20–55)
TIBC: 321 ug/dL (ref 215–435)
UIBC: 234 ug/dL

## 2010-07-21 LAB — LIPID PANEL
Cholesterol: 195 mg/dL (ref 0–200)
HDL: 26 mg/dL — ABNORMAL LOW (ref >39)
LDL Cholesterol: 140 mg/dL — ABNORMAL HIGH (ref 0–99)
Total CHOL/HDL Ratio: 7.5 RATIO
Triglycerides: 144 mg/dL (ref <150)
VLDL: 29 mg/dL (ref 0–40)

## 2010-07-21 LAB — DIFFERENTIAL
Basophils Absolute: 0 10*3/uL (ref 0.0–0.1)
Basophils Relative: 1 % (ref 0–1)
Eosinophils Absolute: 0.2 10*3/uL (ref 0.0–0.7)
Eosinophils Relative: 4 % (ref 0–5)
Lymphocytes Relative: 33 % (ref 12–46)
Lymphs Abs: 1.4 10*3/uL (ref 0.7–4.0)
Monocytes Absolute: 0.4 10*3/uL (ref 0.1–1.0)
Monocytes Relative: 9 % (ref 3–12)
Neutro Abs: 2.2 10*3/uL (ref 1.7–7.7)
Neutrophils Relative %: 53 % (ref 43–77)

## 2010-07-21 LAB — POCT I-STAT, CHEM 8
BUN: 10 mg/dL (ref 6–23)
Calcium, Ion: 1.17 mmol/L (ref 1.12–1.32)
Chloride: 104 mEq/L (ref 96–112)
Creatinine, Ser: 1.2 mg/dL (ref 0.4–1.5)
Glucose, Bld: 99 mg/dL (ref 70–99)
HCT: 39 % (ref 39.0–52.0)
Hemoglobin: 13.3 g/dL (ref 13.0–17.0)
Potassium: 3.7 mEq/L (ref 3.5–5.1)
Sodium: 140 mEq/L (ref 135–145)
TCO2: 25 mmol/L (ref 0–100)

## 2010-07-21 LAB — URINE MICROSCOPIC-ADD ON

## 2010-07-21 LAB — CK TOTAL AND CKMB (NOT AT ARMC)
CK, MB: 7 ng/mL — ABNORMAL HIGH (ref 0.3–4.0)
Relative Index: 1.8 (ref 0.0–2.5)
Total CK: 390 U/L — ABNORMAL HIGH (ref 7–232)

## 2010-07-21 LAB — CBC
HCT: 38.7 % — ABNORMAL LOW (ref 39.0–52.0)
Hemoglobin: 12.6 g/dL — ABNORMAL LOW (ref 13.0–17.0)
MCHC: 32.5 g/dL (ref 30.0–36.0)
MCV: 73.3 fL — ABNORMAL LOW (ref 78.0–100.0)
Platelets: 154 10*3/uL (ref 150–400)
RBC: 5.27 MIL/uL (ref 4.22–5.81)
RDW: 17.1 % — ABNORMAL HIGH (ref 11.5–15.5)
WBC: 4.1 10*3/uL (ref 4.0–10.5)

## 2010-07-21 LAB — BASIC METABOLIC PANEL
BUN: 13 mg/dL (ref 6–23)
CO2: 24 mEq/L (ref 19–32)
Calcium: 8.8 mg/dL (ref 8.4–10.5)
Chloride: 103 mEq/L (ref 96–112)
Creatinine, Ser: 1.14 mg/dL (ref 0.4–1.5)
GFR calc Af Amer: >60 mL/min (ref >60)
GFR calc non Af Amer: >60 mL/min (ref >60)
Glucose, Bld: 134 mg/dL — ABNORMAL HIGH (ref 70–99)
Potassium: 3.3 mEq/L — ABNORMAL LOW (ref 3.5–5.1)
Sodium: 137 mEq/L (ref 135–145)

## 2010-07-21 LAB — POCT CARDIAC MARKERS
CKMB, poc: 3.3 ng/mL (ref 1.0–8.0)
CKMB, poc: 2.3 ng/mL (ref 1.0–8.0)
Myoglobin, poc: 121 ng/mL (ref 12–200)
Myoglobin, poc: 150 ng/mL (ref 12–200)
Troponin i, poc: <0.05 ng/mL (ref 0.00–0.09)
Troponin i, poc: <0.05 ng/mL (ref 0.00–0.09)

## 2010-07-21 LAB — PROTIME-INR
INR: 1 (ref 0.00–1.49)
Prothrombin Time: 13.1 seconds (ref 11.6–15.2)

## 2010-07-21 LAB — TROPONIN I: Troponin I: 0.02 ng/mL (ref 0.00–0.06)

## 2010-07-21 LAB — APTT: aPTT: 37 seconds (ref 24–37)

## 2010-07-21 LAB — FERRITIN: Ferritin: 186 ng/mL (ref 22–322)

## 2010-07-21 LAB — BRAIN NATRIURETIC PEPTIDE: Pro B Natriuretic peptide (BNP): <30 pg/mL (ref 0.0–100.0)

## 2010-07-21 LAB — MAGNESIUM: Magnesium: 2.1 mg/dL (ref 1.5–2.5)

## 2010-07-21 LAB — VITAMIN B12: Vitamin B-12: 476 pg/mL (ref 211–911)

## 2010-07-21 LAB — TSH: TSH: 1.225 u[IU]/mL (ref 0.350–4.500)

## 2010-07-21 LAB — FOLATE: Folate: 18.7 ng/mL

## 2010-07-22 LAB — POCT CARDIAC MARKERS
CKMB, poc: 6.5 ng/mL (ref 1.0–8.0)
Myoglobin, poc: 271 ng/mL (ref 12–200)
Troponin i, poc: <0.05 ng/mL (ref 0.00–0.09)

## 2010-07-22 LAB — POCT I-STAT, CHEM 8
BUN: 12 mg/dL (ref 6–23)
Calcium, Ion: 1.21 mmol/L (ref 1.12–1.32)
Chloride: 105 mEq/L (ref 96–112)
Creatinine, Ser: 1.4 mg/dL (ref 0.4–1.5)
Glucose, Bld: 83 mg/dL (ref 70–99)
HCT: 41 % (ref 39.0–52.0)
Hemoglobin: 13.9 g/dL (ref 13.0–17.0)
Potassium: 4.1 mEq/L (ref 3.5–5.1)
Sodium: 141 mEq/L (ref 135–145)
TCO2: 28 mmol/L (ref 0–100)

## 2010-07-22 LAB — DIFFERENTIAL
Basophils Absolute: 0.1 10*3/uL (ref 0.0–0.1)
Basophils Relative: 2 % — ABNORMAL HIGH (ref 0–1)
Eosinophils Absolute: 0.2 10*3/uL (ref 0.0–0.7)
Eosinophils Relative: 4 % (ref 0–5)
Lymphocytes Relative: 39 % (ref 12–46)
Lymphs Abs: 2.1 10*3/uL (ref 0.7–4.0)
Monocytes Absolute: 0.4 10*3/uL (ref 0.1–1.0)
Monocytes Relative: 7 % (ref 3–12)
Neutro Abs: 2.6 10*3/uL (ref 1.7–7.7)
Neutrophils Relative %: 49 % (ref 43–77)

## 2010-07-22 LAB — CBC
HCT: 37.3 % — ABNORMAL LOW (ref 39.0–52.0)
Hemoglobin: 12.4 g/dL — ABNORMAL LOW (ref 13.0–17.0)
MCHC: 33.3 g/dL (ref 30.0–36.0)
MCV: 72.2 fL — ABNORMAL LOW (ref 78.0–100.0)
Platelets: 196 10*3/uL (ref 150–400)
RBC: 5.16 MIL/uL (ref 4.22–5.81)
RDW: 17.2 % — ABNORMAL HIGH (ref 11.5–15.5)
WBC: 5.4 10*3/uL (ref 4.0–10.5)

## 2010-07-23 LAB — URINE MICROSCOPIC-ADD ON

## 2010-07-23 LAB — URINALYSIS, ROUTINE W REFLEX MICROSCOPIC
Bilirubin Urine: NEGATIVE
Glucose, UA: NEGATIVE mg/dL
Hgb urine dipstick: NEGATIVE
Ketones, ur: NEGATIVE mg/dL
Nitrite: NEGATIVE
Protein, ur: NEGATIVE mg/dL
Specific Gravity, Urine: 1.009 (ref 1.005–1.030)
Urobilinogen, UA: 0.2 mg/dL (ref 0.0–1.0)
pH: 6 (ref 5.0–8.0)

## 2010-07-23 LAB — BASIC METABOLIC PANEL
BUN: 15 mg/dL (ref 6–23)
CO2: 27 mEq/L (ref 19–32)
Calcium: 9.2 mg/dL (ref 8.4–10.5)
Chloride: 100 mEq/L (ref 96–112)
Creatinine, Ser: 1.13 mg/dL (ref 0.4–1.5)
GFR calc Af Amer: >60 mL/min (ref >60)
GFR calc non Af Amer: >60 mL/min (ref >60)
Glucose, Bld: 105 mg/dL — ABNORMAL HIGH (ref 70–99)
Potassium: 3.4 mEq/L — ABNORMAL LOW (ref 3.5–5.1)
Sodium: 136 mEq/L (ref 135–145)

## 2010-07-23 LAB — DIFFERENTIAL
Basophils Absolute: 0.1 10*3/uL (ref 0.0–0.1)
Basophils Relative: 1 % (ref 0–1)
Eosinophils Absolute: 0.3 10*3/uL (ref 0.0–0.7)
Eosinophils Relative: 5 % (ref 0–5)
Lymphocytes Relative: 26 % (ref 12–46)
Lymphs Abs: 1.6 10*3/uL (ref 0.7–4.0)
Monocytes Absolute: 0.5 10*3/uL (ref 0.1–1.0)
Monocytes Relative: 8 % (ref 3–12)
Neutro Abs: 3.6 10*3/uL (ref 1.7–7.7)
Neutrophils Relative %: 59 % (ref 43–77)

## 2010-07-23 LAB — CBC
HCT: 37.8 % — ABNORMAL LOW (ref 39.0–52.0)
Hemoglobin: 12.3 g/dL — ABNORMAL LOW (ref 13.0–17.0)
MCHC: 32.7 g/dL (ref 30.0–36.0)
MCV: 73.2 fL — ABNORMAL LOW (ref 78.0–100.0)
Platelets: 180 10*3/uL (ref 150–400)
RBC: 5.16 MIL/uL (ref 4.22–5.81)
RDW: 16.6 % — ABNORMAL HIGH (ref 11.5–15.5)
WBC: 6 10*3/uL (ref 4.0–10.5)

## 2010-07-28 LAB — POCT I-STAT, CHEM 8
BUN: 12 mg/dL (ref 6–23)
Calcium, Ion: 1.03 mmol/L — ABNORMAL LOW (ref 1.12–1.32)
Chloride: 106 mEq/L (ref 96–112)
Creatinine, Ser: 1.5 mg/dL (ref 0.4–1.5)
Glucose, Bld: 88 mg/dL (ref 70–99)
HCT: 44 % (ref 39.0–52.0)
Hemoglobin: 15 g/dL (ref 13.0–17.0)
Potassium: 3.9 mEq/L (ref 3.5–5.1)
Sodium: 136 mEq/L (ref 135–145)
TCO2: 24 mmol/L (ref 0–100)

## 2010-07-28 LAB — CBC
HCT: 40.9 % (ref 39.0–52.0)
Hemoglobin: 13.3 g/dL (ref 13.0–17.0)
MCHC: 32.6 g/dL (ref 30.0–36.0)
MCV: 72.8 fL — ABNORMAL LOW (ref 78.0–100.0)
Platelets: 211 10*3/uL (ref 150–400)
RBC: 5.62 MIL/uL (ref 4.22–5.81)
RDW: 16.4 % — ABNORMAL HIGH (ref 11.5–15.5)
WBC: 5.3 10*3/uL (ref 4.0–10.5)

## 2010-07-28 LAB — POCT CARDIAC MARKERS
CKMB, poc: 3 ng/mL (ref 1.0–8.0)
CKMB, poc: 4.1 ng/mL (ref 1.0–8.0)
Myoglobin, poc: 185 ng/mL (ref 12–200)
Myoglobin, poc: 273 ng/mL (ref 12–200)
Troponin i, poc: <0.05 ng/mL (ref 0.00–0.09)
Troponin i, poc: <0.05 ng/mL (ref 0.00–0.09)

## 2010-07-28 LAB — COMPREHENSIVE METABOLIC PANEL
ALT: 24 U/L (ref 0–53)
AST: 20 U/L (ref 0–37)
Albumin: 3.6 g/dL (ref 3.5–5.2)
Alkaline Phosphatase: 62 U/L (ref 39–117)
BUN: 11 mg/dL (ref 6–23)
CO2: 26 mEq/L (ref 19–32)
Calcium: 9.3 mg/dL (ref 8.4–10.5)
Chloride: 105 mEq/L (ref 96–112)
Creatinine, Ser: 1.31 mg/dL (ref 0.4–1.5)
GFR calc Af Amer: >60 mL/min (ref >60)
GFR calc non Af Amer: 54 mL/min — ABNORMAL LOW (ref >60)
Glucose, Bld: 84 mg/dL (ref 70–99)
Potassium: 4 mEq/L (ref 3.5–5.1)
Sodium: 139 mEq/L (ref 135–145)
Total Bilirubin: 0.3 mg/dL (ref 0.3–1.2)
Total Protein: 6.6 g/dL (ref 6.0–8.3)

## 2010-07-28 LAB — DIFFERENTIAL
Basophils Absolute: 0.1 10*3/uL (ref 0.0–0.1)
Basophils Relative: 1 % (ref 0–1)
Eosinophils Absolute: 0.2 10*3/uL (ref 0.0–0.7)
Eosinophils Relative: 4 % (ref 0–5)
Lymphocytes Relative: 31 % (ref 12–46)
Lymphs Abs: 1.6 10*3/uL (ref 0.7–4.0)
Monocytes Absolute: 0.3 10*3/uL (ref 0.1–1.0)
Monocytes Relative: 7 % (ref 3–12)
Neutro Abs: 3 10*3/uL (ref 1.7–7.7)
Neutrophils Relative %: 57 % (ref 43–77)

## 2010-07-28 LAB — URINALYSIS, ROUTINE W REFLEX MICROSCOPIC
Bilirubin Urine: NEGATIVE
Glucose, UA: NEGATIVE mg/dL
Hgb urine dipstick: NEGATIVE
Ketones, ur: NEGATIVE mg/dL
Nitrite: NEGATIVE
Protein, ur: NEGATIVE mg/dL
Specific Gravity, Urine: 1.008 (ref 1.005–1.030)
Urobilinogen, UA: 0.2 mg/dL (ref 0.0–1.0)
pH: 5.5 (ref 5.0–8.0)

## 2010-07-28 LAB — URINE MICROSCOPIC-ADD ON

## 2010-07-28 LAB — URINE CULTURE

## 2010-07-28 LAB — PROTIME-INR
INR: 0.9 (ref 0.00–1.49)
Prothrombin Time: 12.4 seconds (ref 11.6–15.2)

## 2010-08-25 NOTE — Assessment & Plan Note (Signed)
OFFICE VISIT   Joel [name], Joel [name]  DOB:  [birth year]                                       05/05/2009  CHART#:[id number]   REASON FOR VISIT:  Iliac aneurysms.   CHIEF COMPLAINT:  Abdominal pain.   HISTORY:  This is a 68-year gentleman seen at the request of Dr. Nehemiah Settle  for evaluation of bilateral iliac aneurysms.  The patient went to the  hospital last week for crampy abdominal pain.  A CT scan was ordered  which identified bilateral iliac aneurysms.  The patient's abdominal  pain subsequently resolved.  He has not had any trouble with his  abdominal pain since that time.  It was unknown prior to this that he  had aneurysms.   The patient has a history of hypertension and hypercholesterolemia, both  which are medically managed,   REVIEW OF SYSTEMS:  GENERAL:  Negative fevers, chills, weight gain,  weight loss.  CARDIAC:  Positive for occasional chest pain, shortness of breath.  PULMONARY:  Negative.  GI:  Negative.  GU:  Negative.  VASCULAR:  He has pain in his legs when walking.  He describes pain in  his butt at approximately 100 yards.  NEUROLOGIC:  Positive for headaches.  MUSCULOSKELETAL:  Negative.  PSYCH:  Negative.  ENT:  Negative.  HEM:  Negative.  SKIN:  Negative.   PAST MEDICAL HISTORY:  Hypertension, hypercholesterolemia.   PAST SURGICAL HISTORY:  None.   FAMILY HISTORY:  Negative for cardiovascular disease at an early age.   SOCIAL HISTORY:  He is widowed with 5 children.  He is retired.  Currently smokes 1 pack of cigarettes a day.  Does not drink alcohol.   PHYSICAL EXAMINATION:  Blood pressure 113/55, temperature is 97.4, heart  rate is 52.  General:  Well-appearing in no distress.  HEENT is normal.  Lungs are clear bilaterally.  Cardiovascular:  Regular rate and rhythm.  Pedal and popliteal pulses are not palpable.  No carotid bruits are  identified.  Abdomen is soft, nontender.  No hepatosplenomegaly.  Musculoskeletal is  without major deformities or cyanosis.  Neurologically, he has no focal weaknesses or paresthesias.  Skin is  without rash.   DIAGNOSTIC STUDIES:  CT angiogram was independently reviewed by myself.  This reveals bilateral common iliac aneurysms right greater than left.  The right internal iliac artery is occluded at its origin.  The left is  aneurysmal and dilated to 2.4 cm.   ASSESSMENT/PLAN:  Bilateral iliac aneurysms.   PLAN:  I discussed referral to [facility] for iliac branch devices,  which is not available at [facility].  The other option would be to proceed  with diagnostic arteriogram since his right internal iliac artery is  occluded.  We can embolize the left and treat both with stent graft.  The patient does not want to proceed with open operation at this time.  I will review his records and get back to the patient, and discuss our  options.     Jorge Ny, MD  Electronically Signed   VWB/MEDQ  D:  05/05/2009  T:  05/06/2009  Job:  [id number]   cc:   Deirdre Peer. Polite, M.D.

## 2010-08-25 NOTE — Assessment & Plan Note (Signed)
OFFICE VISIT   Joel [name], Joel [name]  DOB:  [birth year]                                       03/18/2010  CHART#:[id number]   I saw the patient in the office today for followup after his left below-  knee posterior tibial artery bypass graft with a vein graft which was  done on October 01, 2009.  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  I last saw him on December 17, 2009.  The distal aspect of the right great toe was slowly improving.  He had been soaking his foot in Epsom salt.  The third toe had a wound  which also was healing.  He comes in for a 45-month follow-up visit.  He  has had no pain in the left foot and has been ambulating without  difficulty.   REVIEW OF SYSTEMS:  He has had no chest pain, chest pressure,  palpitations or arrhythmias.   PHYSICAL EXAMINATION:  General:  This is a pleasant 70 year old  gentleman who appears his stated age.  Vital signs:  His blood pressure  is 178/99, heart rate is 59, temperature is 98.1.  He has a palpable  femoral graft and posterior tibial pulse on the left.  Extremities:  Reveals that the left great toe and third toe have now completely  healed.   Overall I am pleased his progress.  I have encouraged him to stay as  active as possible.  Fortunately he is not a smoker.  We will follow his  graft in our protocol.  I will plan on seeing him back in 1 year.  He  knows to call sooner if he has problems.     Di Kindle. Edilia Bo, M.D.  Electronically Signed   CSD/MEDQ  D:  03/18/2010  T:  03/19/2010  Job:  [id number]

## 2010-08-25 NOTE — Assessment & Plan Note (Signed)
OFFICE VISIT   Joel [name], Joel [name]  DOB:  1941-02-22                                       09/18/2009  CHART#:[id number]   I saw the patient in the office today for a second opinion concerning  his left leg toe wounds.  His daughter works as a Diplomatic Services operational officer in the  surgical intensive care unit and the patient had gone to [facility] and  was being considered for bypass and was reluctant to proceed.  I was  asked to give a second opinion concerning the left foot wounds.  Of  note, this patient was originally evaluated by Dr. 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  In order to maintain perfusion to the pelvis it was felt that the best  option for repair would be an iliac branch device and therefore Dr.  Myra Gianotti referred the patient to [facility].  The patient did in fact  undergo an endovascular repair of his aneurysms with a branch device on  April 4 with what sounds like a very good result.  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  Based on the family's history and the records it appears he underwent  duplex evaluation of the left leg and was being considered for bypass.  The daughter wished another opinion concerning this.   Of note, I do not get any clear-cut history of claudication or rest pain  in either lower extremity.  The wounds on the first and third toes of  the left foot have been present since May and have not really shown any  evidence of healing.  The swelling in the foot has improved, however.   His past medical history is significant for hypertension and  hypercholesterolemia.  He denies any history of diabetes, history of  previous myocardial infarction, history of congestive heart failure or  history  of COPD.   SOCIAL HISTORY:  He is widowed.  He has five children.  He smokes less  than a pack per day of cigarettes.   FAMILY HISTORY:  There is no history of premature cardiovascular  disease.   REVIEW OF SYSTEMS:  GENERAL:  He has had some weight loss and loss of  appetite since his surgery.  CARDIOVASCULAR:  He has had no chest pain, chest pressure, palpitations  or arrhythmias.  He has had no history of stroke or TIA.  He has had no  history of DVT or phlebitis.  NEUROLOGIC:  He has occasional headaches.  GI, pulmonary, hematologic, GU, ENT, musculoskeletal, integumentary  review of systems is unremarkable and is documented on the medical  history form in his chart.   PHYSICAL EXAMINATION:  General:  This is a pleasant 70 year old  gentleman who appears his stated age.  Vital signs:  Blood pressure  129/67, heart rate is 56.  Temperature is 98.  HEENT:  Unremarkable.  Lungs:  Clear bilaterally to auscultation without rales, rhonchi or  wheezing.  Cardiovascular:  I do not detect any carotid bruits.  He has  a regular rate and rhythm.  He has palpable femoral pulses and palpable  popliteal pulses bilaterally.  I  cannot palpate pedal pulses on either  side.  There are monophasic Doppler signals in the dorsalis pedis and  posterior tibial positions bilaterally.  Abdomen:  Soft and nontender  with normal pitched bowel sounds.  Musculoskeletal:  There are no major  deformities or cyanosis.  There is dry gangrene of the first and third  toes of the left foot.  Neurological:  There is no focal weakness or  paresthesias.  Lymphatics:  There is no significant cervical, axillary  or inguinal lymphadenopathy.   I have reviewed his records that I have available and it appears that he  was evaluated in [facility] and thought potentially to have had an  embolic event to the first and third toes on the left foot and I believe  for this reason was started on Coumadin.  I reviewed his  laboratory  evaluation from Dr. Idelle Crouch office which shows a creatinine of 1.6.  He  has also had some mild anemia and a CBC from Dr. Idelle Crouch office was  white count of 5.8, H and H of 8.8 and 28.6, platelets of 344,000.  He  was also noted to have an elevated PSA of 7.98.  Of note his  preoperative ABI prior to his aneurysm repair was 0.91 on the right and  0.81 on the left.  In reviewing the notes from [facility] there is a  note that suggests he underwent catheter directed thrombolysis of the  left lower extremity although I cannot find the reason for this  intervention.  He was started on Coumadin after this intervention.   I have reviewed the lab study that was done in [facility] which showed  on the left side no evidence of hemodynamically significant inflow  disease.  It looks like the left posterior tibial ABI was 67% which had  improved from 34% on 07/28/2009.  It sounds like he had an embolic event  to the left leg resulting in the diminished the drop in ABI compared to  his preoperative value which was treated with thrombolysis now with  improvement in his ABI but not back to the baseline prior to his open  endovascular aneurysm repair.   Based on his exam he has evidence of tibial artery occlusive disease.  He has nonhealing wounds of the first and third toes of the left foot  and given the wounds with his tibial disease this could potentially  become a limb threatening problem.  For this reason I have recommended  we proceed with arteriography to see what options he has for  revascularization and will make further recommendations pending these  results.  We will have to check his kidney function preoperatively as he  had a mildly elevated creatinine noted in his old records.  I have  discussed the indications for arteriography and the potential  complications including but not limited to bleeding, arterial injury and  renal insufficiency.  All of his questions were  answered and he is  agreeable to proceed.  We will make further recommendations pending  these results.     Di Kindle. Edilia Bo, M.D.  Electronically Signed   CSD/MEDQ  D:  09/18/2009  T:  09/19/2009  Job:  [id number]   cc:   Deirdre Peer. Polite, M.D.

## 2010-08-25 NOTE — Discharge Summary (Signed)
NAME:  Joel [name], Joel [name] NO.:  [id number]   MEDICAL RECORD NO.:  [id number]          PATIENT TYPE:  OBV   LOCATION:  4703                         FACILITY:  [facility]   PHYSICIAN:  Stacie Glaze, MD    DATE OF BIRTH:  18-Oct-1940   DATE OF ADMISSION:  09/06/2008  DATE OF DISCHARGE:  09/07/2008                               DISCHARGE SUMMARY   This is a 23-hour observation dictation.   Joel [name] is a 70 year old African American male, who was admitted  to the emergency room for chest pain.  He has a long history 23-hour  observation for chest pain, rule out MI.  Again, 70 year old [place]  American male with history of hypertension, hyperlipidemia, and ongoing  tobacco abuse, presented with 1-week history of intermittent substernal  chest pain, poor historian, but states that the pain is associated with  some dyspnea, nonradiating, no nausea, vomiting, or diaphoresis.  The  pain is somewhat worse with movement, but has no blurred vision,  palpitations, dizziness, radiating pain to the arm or chin.  He has had  no lower extremity swelling or weakness.   He is smoking 1-2 packs per day and has question of compliance with his  medications.  Because of his multiple presentations for some chest  discomfort, even though a recent Myoview in 2009 was negative.  He was  admitted for 23-hour observation with a cardiology consultation for rule  out MI.  He had no bump in his troponins during his admission, 3  troponin levels were checked.  A Cardiology consultation was obtained,  which suggested possible pericarditis because of the movement nature and  recommended Naprosyn, but because of his risk factors suggested that an  elective cardiac catheterization could be obtained as an outpatient.   This was discussed with the patient.  He was in agreement that this was  an appropriate step.   For increased cardiac protection, we did increase his atenolol to 50 mg  twice a  day, we called in a prescription for 100 mg once a day.  We  increased his simvastatin from 20-40 and we took him off of Aggrenox  because of the ongoing headaches that he has been persistently having,  which is probably due to the Aggrenox.  He is going to be on Naprosyn  220 one p.o. b.i.d. for 5 days, then he should convert to a full aspirin  every day.  He should follow up with Dr. Tawanna Cooler in 1 week and Dr.  Gala Romney has been notified via the electronic medical record to  schedule him for his outpatient cardiac catheterization.  He is  discharged to home in stable condition.   DISCHARGE DIAGNOSIS:  Atypical chest pain.      Stacie Glaze, MD  Electronically Signed     JEJ/MEDQ  D:  09/07/2008  T:  09/08/2008  Job:  [id number]

## 2010-08-25 NOTE — Consult Note (Signed)
NAME:  Joel [name], Joel [name] NO.:  [id number]   MEDICAL RECORD NO.:  [id number]          PATIENT TYPE:  OBV   LOCATION:  4703                         FACILITY:  [facility]   PHYSICIAN:  Bevelyn Buckles. Bensimhon, MDDATE OF BIRTH:  1941-01-18   DATE OF CONSULTATION:  09/06/2008  DATE OF DISCHARGE:                                 CONSULTATION   PRIMARY CARDIOLOGIST:  Luis Abed, MD, [facility]   PRIMARY CARE PHYSICIAN:  Dr. Leonides Sake.   Joel [name] is a 70 year old African American gentleman with previous  history of atypical chest pain, status post stress Myoview in 2009 that  was negative.  He has a history ongoing tobacco abuse, borderline  cholesterol, and hypertension.  He presented this admission complaining  of chest discomfort.  Note, Joel [name] is somewhat poor historian.  He described in 2 months of atypical chest pain on a daily basis without  increased frequency or duration, not associated with any other symptoms  except for occasional worsening with movement.  Joel [name] is  retired, but continues to remain very active.  He does a lot of work on  automobiles, does a lot of lifting, moving with this and never has the  discomfort while he is doing exertional work.  He states it usually  bothers him in the evening when he lays down and tries to relax.  Yesterday, he was outside in the night, working on a engine.  He also  was using a lug wrench and changing tires, had no episodes of discomfort  then.  Yesterday evening, he began to experience some pressure tightness  substernally.  He also complains of a headache and some left shoulder  stiffness from a previous.    Dictation ended at this point.      Dorian Pod, ACNP      Bevelyn Buckles. Bensimhon, MD  Electronically Signed    MB/MEDQ  D:  09/06/2008  T:  09/07/2008  Job:  [id number]

## 2010-08-25 NOTE — Assessment & Plan Note (Signed)
[facility]                            CARDIOLOGY OFFICE NOTE   JONATHYN, CAROTHERS                    MRN:          [id number]  DATE:09/28/2007                            DOB:          05-18-1940    Mr. Fier had been seen in the emergency room at [facility] on September 25, 2007.  A copy was to go to Dr. Tawanna Cooler.  Unfortunately, the first name  was listed as Sharia Reeve instead of Trey Paula.  We will try to get a copy of this  note to Dr. Alonza Smoker.   The patient has had some discomfort in his chest.  Etiology was not  completely clear.  Screening enzymes were negative.  He refused to stay  in the hospital.  We allowed him to go home for a followup Myoview scan.  This study was done on September 27, 2007.  His ejection fraction was 51%.  He had fair exercise capacity.  There were no significant EKG changes.  The nuclear images were normal.  There was no definite sign of ischemia.   It appears that Mr. Mairena is stable.  We will not arrange for  cardiac followup.  I will be happy to see him at any time in follow up  in the future.  He will follow up with Dr. Alonza Smoker.     Luis Abed, MD, [facility]  Electronically Signed    JDK/MedQ  DD: 09/28/2007  DT: 09/29/2007  Job #: [id number]   cc:   Tinnie Gens A. Tawanna Cooler, MD

## 2010-08-25 NOTE — Consult Note (Signed)
NAME:  Joel [name], Joel [name] NO.:  [id number]   MEDICAL RECORD NO.:  [id number]          PATIENT TYPE:  EMS   LOCATION:  MAJO                         FACILITY:  [facility]   PHYSICIAN:  Joel Abed, MD, FACCDATE OF BIRTH:  1940/09/16   DATE OF CONSULTATION:  09/25/2007  DATE OF DISCHARGE:  09/25/2007                                 CONSULTATION   PRIMARY CARE PHYSICIAN:  Joel Jefferson, MD, who is in [place].   HISTORY:  Joel [name] is a 70 year old African American gentleman with  no known history of coronary artery disease.  He does have a history of  hypertension and states compliance with his medications.  For the last 6  days, he has had chest discomfort somewhat vague poor historian.  He  describes as a substernal tightness not associated with any activity  position.  He states sometimes the substernal discomfort radiates back  between his shoulder blades.  It lasts anywhere from 15 minutes to 30  minutes.  He has no associated symptoms with it.  He states it comes on  when he is moving and comes on when he is sitting.  He has never  experienced anything like this before.  He states yesterday, it was at  its worse.  He went to the prompt care, but had already closed last  night, so he came back home.  Today, chest discomfort returned and he  decided to come in to get evaluated.  So, he went to Urgent Care  initially.  Blood pressure there was stable at 130/85. A 12-lead EKG  showed no acute abnormalities.  He did have a very mild ST elevation in  lateral leads less than a millimeter.  He denies any shortness of  breath, nausea, lightheadedness, dizziness, or palpitations.  Mr.  [name] is retired, but he continues to remain very active.  He states  he does love the yard work.  He was previously an Journalist, newspaper.  He  does a lot a work now on his own.  He states it is unusual for him to do  a lot of lifting.  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  He does  however, continue to smoke almost a pack per day, has done so for years.  He states that he has no problems, just this discomfort is worrisome and  that there is being nothing wrong with him unless we find something  today.   PAST MEDICAL HISTORY:  1. Hypertension.  2. Ongoing tobacco use.  3. Borderline cholesterol.  4. History of EtOH abuse, quit in 1995.  The patient denies any history of surgeries.  Denies any history of  diabetes, thyroid abnormality, or lung abnormalities.   SOCIAL HISTORY:  He lives in [place] alone.  He is retired Psychologist, prison and probation services.  He smokes one-pack a day.  He denies any EtOH, drug, or  medication use.  He states he does not eat any fried foods.   FAMILY HISTORY:  Mother deceased in her 38s secondary to  aneurysm.  Father deceased in his 72s secondary to complications of COPD.  No  sibling history of CAD.   REVIEW OF SYSTEMS:  Positive for chest pain as described above.  All  other systems reviewed and negative per patient's report.   ALLERGIES:  No known drug allergies.   CURRENT MEDICATIONS:  1. Hydrochlorothiazide 25 mg.  2. Atenolol 75 mg.  3. Prilosec OTC.  4. Benicar 40 mg.  5. Aspirin 325.   PHYSICAL EXAMINATION:  VITAL SIGNS:  The patient is afebrile.  Heart  rate 50, respirations 20, blood pressure 119/72, and sat 100% on 2  liters.  GENERAL:  He is in no acute distress at this time.  He denies any chest  pain.  HEENT:  Unremarkable.  NECK:  Supple without bruits or JVD.  CARDIOVASCULAR:  Exam reveals S1 and S2, brady rhythm.  LUNGS:  Clear to auscultation.  SKIN:  Warm and dry.  ABDOMEN:  Soft, nontender, and positive bowel sounds.  CHEST:  No reproducible pain.  LOWER EXTREMITIES:  Without clubbing, cyanosis, or edema.  NEUROLOGICALLY:  Alert and oriented x3.   LABORATORY DATA:  Chest x-ray showed no acute findings.  EKG sinus brady  at a rate of 56 with maybe 0.5 mm ST  elevation in lateral leads.  No  previous EKG prior today to compare.  Lab work, H&H 13.5 and 41.  WBCs  5.5 and platelets 213,000.  Sodium 132, potassium 3.8, chloride 105, CO2  29, BUN 9, creatinine 1.0, glucose 95, AST 18, and ALT 24.  First point  of care markers are negative.  PT 12.7 and INR 0.9.   IMPRESSION:  1. Chest pain  2. Ongoing tobacco.  3. Hypertension.  4. Borderline dyslipidemia.   RECOMMENDATIONS:  We would recommend that the patient stay for 24-hour  observation, cycle enzymes, and arrange for outpatient stress Myoview  and decrease dose on his atenolol.  The patient has been seen and  examined by Dr. Myrtis Ser.  There is no indication of proven cardiac injury.  He is concerned that patient's pain might be cardiac, but the patient  refuses to stay in the hospital.  We will arrange outpatient Myoview  scan for September 26, 2007 if possible, if not September 27, 2007.  The patient  is being discharged to home with instructions continue his medicines.  Hold his atenolol in the morning until after his stress Myoview.  I have  faxed all the appropriate information to the office.  They will call him  at home at [phone] to arrange for stress Myoview.      Dorian Pod, ACNP      Joel Abed, MD, [facility]  Electronically Signed   MB/MEDQ  D:  09/25/2007  T:  09/26/2007  Job:  [id number]   cc:   Joel Jefferson, MD

## 2010-08-25 NOTE — Assessment & Plan Note (Signed)
OFFICE VISIT   BRAYTON, BAUMGARTNER  DOB:  June 13, 1940                                       11/06/2009  CHART#:[id number]   I saw the patient in the office today for followup after his recent left  lower extremity bypass.  This is a 70 year old gentleman who had  undergone an endovascular repair of an aneurysm at [facility].  He had  an iliac branch device placed because of complicated anatomy including a  left hypogastric artery aneurysm.  He did well after his repair with the  exception of embolization to his left leg.  I saw him in consultation in  June with a nonhealing wound of the left great and third toes.  He had  tibial artery occlusive disease.  He underwent a left below knee pop to  posterior tibial artery bypass with a vein graft on 10/01/2009.  He  comes in for his first followup.  Overall he has been doing quite well  and has no specific complaints.   On examination his incisions are healing nicely and he has a palpable  popliteal pulse with a warm and well-perfused foot.  He has biphasic  Doppler signals in the left foot with an ABI of 100%.  I debrided the  third toe tip.  He has dry gangrene of the first and third toe tips  which is gradually improving.   I have instructed him to soak his foot daily in lukewarm Dial soap soaks  and I plan on seeing him back in 6 weeks.  He knows to call sooner if he  has problems.     Di Kindle. Edilia Bo, M.D.  Electronically Signed   CSD/MEDQ  D:  11/06/2009  T:  11/06/2009  Job:  [id number]   cc:   Deirdre Peer. Polite, M.D.

## 2010-08-25 NOTE — Assessment & Plan Note (Signed)
OFFICE VISIT   WILMAN, TUCKER  DOB:  1941-02-26                                       12/17/2009  CHART#:[id number]   I saw the patient in the office today for continued follow-up after his  left below knee pop to posterior tibial artery bypass graft with vein  graft on 10/01/2009. This was for nonhealing wounds of the left foot  secondary to embolic disease.  He comes in for routine wound check.  He  has no specific complaints and has been ambulating without difficulties.   PHYSICAL EXAMINATION:  Blood pressure is 174/88, heart rate is 64,  temperature 97.9.  He has palpable femoral, popliteal and posterior  tibial pulses on the right.  The distal aspect of his right great toe is  slowly improving.  He has been soaking the foot in Epsom salts.  The  third toe is almost completely healed.   Overall, I am pleased with his progress.  I will plan on seeing him back  in 3 months.  He knows to call sooner if he has problems.     Di Kindle. Edilia Bo, M.D.  Electronically Signed   CSD/MEDQ  D:  12/17/2009  T:  12/18/2009  Job:  [id number]

## 2010-08-25 NOTE — Consult Note (Signed)
NAME:  Joel [name], Joel [name] NO.:  [id number]   MEDICAL RECORD NO.:  [id number]          PATIENT TYPE:  OBV   LOCATION:  4703                         FACILITY:  [facility]   PHYSICIAN:  Bevelyn Buckles. Bensimhon, MDDATE OF BIRTH:  March 07, 1941   DATE OF CONSULTATION:  09/06/2008  DATE OF DISCHARGE:                                 CONSULTATION   PRIMARY CARDIOLOGIST:  Luis Abed, MD, [facility] [place]   PRIMARY CARE:  Leonides Sake.   HISTORY OF PRESENT ILLNESS:  Mr. Steeves is a 70 year old African  American gentleman status post stress Myoview approximately 1 year ago  that was negative, comes into the ER this date complaining of chest  discomfort that has been going on for 2 months, somewhat poor historian.  He is complaining of chest pressure, tightness on a daily basis without  increase in frequency or duration, and no other associated symptoms, but  he does state it usually worse with movement.  He was outside working in  the nine degree weather yesterday, removing tires with a Therapist, sports from  car, doing Journalist, newspaper work, had no discomfort then.  Yesterday  evening he states he began to have some of the chest discomfort that  continued intermittently through the night, this morning it lasted for  over an hour.  He also experienced some mild shortness of breath.  He is  also complaining of a headache and left shoulder stiffness.  He decided  to get evaluated.  Currently, is not having any chest discomfort, has  not had any since he arrived and was treated with baby aspirin.  A 12-  lead EKG showing no acute findings in a patient with prolonged chest  discomfort well over an hour in duration.   PAST MEDICAL HISTORY:  Hypertension, ongoing tobacco abuse, borderline  cholesterol, history of EtOH abuse quit in 1995, GERD, and TIA.   SOCIAL HISTORY:  Lives in [place] alone.  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  He smokes one to two  pack  cigarettes a day, denies any EtOH or illicit use.   FAMILY HISTORY:  Mother deceased in her 1s from aneurysm.  Father  deceased in his 43s from COPD.  No siblings with known coronary artery  disease.   REVIEW OF SYSTEMS:  Positive for headaches, chest pain, dyspnea on  exertion, left shoulder pain, stiffness, and back pain as described  above.   ALLERGIES:  Cough with ACE INHIBITORS.   CURRENT MEDICATIONS:  Prilosec 20, hydrochlorothiazide 25, atenolol 75,  Zocor 20, Darvocet, Benicar 40, Aggrenox 25/200.   PHYSICAL EXAMINATION:  VITAL SIGNS:  Temperature 97.6, heart rate 56,  respirations 12, blood pressure 137/86, sat 99% on room air.  GENERAL:  In no acute distress.  HEENT:  Unremarkable.  NECK:  Supple without bruit or JVD.  CARDIOVASCULAR:  S1 and S2.  Regular rate and rhythm.  LUNGS:  Clear to auscultation bilaterally.  SKIN:  Warm and dry.  ABDOMEN:  Soft, nontender, positive bowel sounds.  LOWER EXTREMITIES:  Without clubbing, cyanosis, or edema.  NEUROLOGIC:  Alert and oriented x3.   Chest x-ray without acute findings, sinus rhythm, positive for LVH with  diffuse minimal ST elevation throughout.  No change from previous EKG in  2009.  Cardiac enzymes negative x1 set.  Creatinine 1.14, potassium 3.3,  hematocrit 38.7.  The patient with chest pain, somewhat atypical.  Recent negative stress Myoview within the last year in the setting of  ongoing tobacco abuse, and a history of TIA.  The patient has been  examined and assessed by Dr. Gala Romney, discussed repeat stress versus  catheterization.  The patient wants to think about it.  We have  recommended overnight observation to rule out MI, if rules out would  discharge home and schedule outpatient catheterization next week.  The  patient states he will talk to his family in the side by tomorrow  morning.  The patient's enzymes are positive.  We will need to proceed  with cardiac catheterization during this  hospitalization.      Dorian Pod, ACNP      Bevelyn Buckles. Bensimhon, MD  Electronically Signed    MB/MEDQ  D:  09/06/2008  T:  09/07/2008  Job:  [id number]   cc:   Leonides Sake

## 2010-10-07 ENCOUNTER — Encounter (INDEPENDENT_AMBULATORY_CARE_PROVIDER_SITE_OTHER): Payer: Medicare Other

## 2010-10-07 DIAGNOSIS — L98499 Non-pressure chronic ulcer of skin of other sites with unspecified severity: Secondary | ICD-10-CM

## 2010-10-07 DIAGNOSIS — I739 Peripheral vascular disease, unspecified: Secondary | ICD-10-CM

## 2010-10-07 DIAGNOSIS — Z48812 Encounter for surgical aftercare following surgery on the circulatory system: Secondary | ICD-10-CM

## 2010-10-12 ENCOUNTER — Encounter: Payer: Self-pay | Admitting: Internal Medicine

## 2010-10-16 NOTE — Procedures (Unsigned)
BYPASS GRAFT EVALUATION  INDICATION:  Left lower extremity bypass graft.  HISTORY: Diabetes:  No. Cardiac:  No. Hypertension:  Yes. Smoking:  Yes. Previous Surgery:  Left popliteal to posterior tibial artery bypass graft on 10/01/2009.  SINGLE LEVEL ARTERIAL EXAM                              RIGHT              LEFT Brachial:                    126                123 Anterior tibial:             117                135 Posterior tibial:            127                157 Peroneal: Ankle/brachial index:        1.01               1.25  PREVIOUS ABI:  Date: 11/06/2009  RIGHT:  1.04  LEFT:  1.11  LOWER EXTREMITY BYPASS GRAFT DUPLEX EXAM:  DUPLEX:  Biphasic Doppler waveforms noted throughout the left lower extremity bypass graft with no increase in velocities.  IMPRESSION: 1. Patent left lower extremity bypass graft with no evidence of     stenosis. 2. Bilateral ankle brachial indices and Doppler waveforms suggest     normal perfusion of the bilateral lower extremities.  Stable     bilateral ankle brachial indices noted when compared to the     previous examination, as described above.  ___________________________________________ Di Kindle. Edilia Bo, M.D.  CH/MEDQ  D:  10/07/2010  T:  10/07/2010  Job:  [id number]

## 2010-10-30 ENCOUNTER — Emergency Department (HOSPITAL_COMMUNITY)
Admission: EM | Admit: 2010-10-30 | Discharge: 2010-10-30 | Disposition: A | Discharge status: Home / Self Care | Payer: Medicare Other | Attending: Emergency Medicine | Admitting: Emergency Medicine

## 2010-10-30 ENCOUNTER — Emergency Department (HOSPITAL_COMMUNITY): Payer: Medicare Other

## 2010-10-30 DIAGNOSIS — I2510 Atherosclerotic heart disease of native coronary artery without angina pectoris: Secondary | ICD-10-CM | POA: Insufficient documentation

## 2010-10-30 DIAGNOSIS — R0609 Other forms of dyspnea: Secondary | ICD-10-CM | POA: Insufficient documentation

## 2010-10-30 DIAGNOSIS — Z9889 Other specified postprocedural states: Secondary | ICD-10-CM | POA: Insufficient documentation

## 2010-10-30 DIAGNOSIS — R0602 Shortness of breath: Secondary | ICD-10-CM | POA: Insufficient documentation

## 2010-10-30 DIAGNOSIS — E789 Disorder of lipoprotein metabolism, unspecified: Secondary | ICD-10-CM | POA: Insufficient documentation

## 2010-10-30 DIAGNOSIS — R0989 Other specified symptoms and signs involving the circulatory and respiratory systems: Secondary | ICD-10-CM | POA: Insufficient documentation

## 2010-10-30 DIAGNOSIS — K219 Gastro-esophageal reflux disease without esophagitis: Secondary | ICD-10-CM | POA: Insufficient documentation

## 2010-10-30 DIAGNOSIS — I10 Essential (primary) hypertension: Secondary | ICD-10-CM | POA: Insufficient documentation

## 2010-10-30 LAB — TROPONIN I: Troponin I: <0.3 ng/mL (ref <0.30)

## 2010-10-30 LAB — CBC
HCT: 35.4 % — ABNORMAL LOW (ref 39.0–52.0)
Hemoglobin: 11.7 g/dL — ABNORMAL LOW (ref 13.0–17.0)
MCH: 22.7 pg — ABNORMAL LOW (ref 26.0–34.0)
MCHC: 33.1 g/dL (ref 30.0–36.0)
MCV: 68.6 fL — ABNORMAL LOW (ref 78.0–100.0)
Platelets: 136 10*3/uL — ABNORMAL LOW (ref 150–400)
RBC: 5.16 MIL/uL (ref 4.22–5.81)
RDW: 17.2 % — ABNORMAL HIGH (ref 11.5–15.5)
WBC: 4.9 10*3/uL (ref 4.0–10.5)

## 2010-10-30 LAB — BASIC METABOLIC PANEL
BUN: 17 mg/dL (ref 6–23)
CO2: 25 mEq/L (ref 19–32)
Calcium: 9.3 mg/dL (ref 8.4–10.5)
Chloride: 105 mEq/L (ref 96–112)
Creatinine, Ser: 1.15 mg/dL (ref 0.50–1.35)
GFR calc Af Amer: >60 mL/min (ref >60)
GFR calc non Af Amer: >60 mL/min (ref >60)
Glucose, Bld: 98 mg/dL (ref 70–99)
Potassium: 3.7 mEq/L (ref 3.5–5.1)
Sodium: 139 mEq/L (ref 135–145)

## 2010-10-30 LAB — DIFFERENTIAL
Basophils Absolute: 0 10*3/uL (ref 0.0–0.1)
Basophils Relative: 1 % (ref 0–1)
Eosinophils Absolute: 0.2 10*3/uL (ref 0.0–0.7)
Eosinophils Relative: 5 % (ref 0–5)
Lymphocytes Relative: 39 % (ref 12–46)
Lymphs Abs: 1.9 10*3/uL (ref 0.7–4.0)
Monocytes Absolute: 0.4 10*3/uL (ref 0.1–1.0)
Monocytes Relative: 8 % (ref 3–12)
Neutro Abs: 2.4 10*3/uL (ref 1.7–7.7)
Neutrophils Relative %: 48 % (ref 43–77)

## 2010-10-30 LAB — PRO B NATRIURETIC PEPTIDE: Pro B Natriuretic peptide (BNP): 96.5 pg/mL (ref 0–125)

## 2010-11-12 ENCOUNTER — Encounter (INDEPENDENT_AMBULATORY_CARE_PROVIDER_SITE_OTHER): Payer: Medicare Other

## 2010-11-12 DIAGNOSIS — Z48812 Encounter for surgical aftercare following surgery on the circulatory system: Secondary | ICD-10-CM

## 2010-11-12 DIAGNOSIS — I739 Peripheral vascular disease, unspecified: Secondary | ICD-10-CM

## 2010-11-15 ENCOUNTER — Emergency Department (HOSPITAL_COMMUNITY)
Admission: EM | Admit: 2010-11-15 | Discharge: 2010-11-15 | Disposition: A | Discharge status: Home / Self Care | Payer: Medicare Other | Attending: Emergency Medicine | Admitting: Emergency Medicine

## 2010-11-15 DIAGNOSIS — E789 Disorder of lipoprotein metabolism, unspecified: Secondary | ICD-10-CM | POA: Insufficient documentation

## 2010-11-15 DIAGNOSIS — K219 Gastro-esophageal reflux disease without esophagitis: Secondary | ICD-10-CM | POA: Insufficient documentation

## 2010-11-15 DIAGNOSIS — I10 Essential (primary) hypertension: Secondary | ICD-10-CM | POA: Insufficient documentation

## 2010-11-15 DIAGNOSIS — M549 Dorsalgia, unspecified: Secondary | ICD-10-CM | POA: Insufficient documentation

## 2010-11-15 DIAGNOSIS — R110 Nausea: Secondary | ICD-10-CM | POA: Insufficient documentation

## 2010-11-15 DIAGNOSIS — I2510 Atherosclerotic heart disease of native coronary artery without angina pectoris: Secondary | ICD-10-CM | POA: Insufficient documentation

## 2010-11-15 DIAGNOSIS — M533 Sacrococcygeal disorders, not elsewhere classified: Secondary | ICD-10-CM | POA: Insufficient documentation

## 2010-11-15 DIAGNOSIS — Z9889 Other specified postprocedural states: Secondary | ICD-10-CM | POA: Insufficient documentation

## 2010-11-15 DIAGNOSIS — M25569 Pain in unspecified knee: Secondary | ICD-10-CM | POA: Insufficient documentation

## 2010-11-15 DIAGNOSIS — Z79899 Other long term (current) drug therapy: Secondary | ICD-10-CM | POA: Insufficient documentation

## 2010-11-15 DIAGNOSIS — R5381 Other malaise: Secondary | ICD-10-CM | POA: Insufficient documentation

## 2010-11-15 DIAGNOSIS — M25559 Pain in unspecified hip: Secondary | ICD-10-CM | POA: Insufficient documentation

## 2010-12-17 ENCOUNTER — Other Ambulatory Visit: Payer: Self-pay | Admitting: Internal Medicine

## 2010-12-17 DIAGNOSIS — R42 Dizziness and giddiness: Secondary | ICD-10-CM

## 2010-12-17 DIAGNOSIS — I639 Cerebral infarction, unspecified: Secondary | ICD-10-CM

## 2010-12-21 ENCOUNTER — Other Ambulatory Visit: Payer: Medicare Other

## 2010-12-21 ENCOUNTER — Ambulatory Visit
Admission: RE | Admit: 2010-12-21 | Discharge: 2010-12-21 | Disposition: A | Discharge status: Home / Self Care | Payer: Medicare Other | Source: Ambulatory Visit | Attending: Internal Medicine | Admitting: Internal Medicine

## 2010-12-21 DIAGNOSIS — I639 Cerebral infarction, unspecified: Secondary | ICD-10-CM

## 2010-12-21 DIAGNOSIS — R42 Dizziness and giddiness: Secondary | ICD-10-CM

## 2011-01-07 LAB — CBC
HCT: 37.9 — ABNORMAL LOW
HCT: 41
Hemoglobin: 12.6 — ABNORMAL LOW
Hemoglobin: 13.5
MCHC: 32.8
MCHC: 33.3
MCV: 72.9 — ABNORMAL LOW
MCV: 73 — ABNORMAL LOW
Platelets: 197
Platelets: 213
RBC: 5.19
RBC: 5.63
RDW: 16.3 — ABNORMAL HIGH
RDW: 16.5 — ABNORMAL HIGH
WBC: 4.8
WBC: 5.5

## 2011-01-07 LAB — TROPONIN I
Troponin I: 0.01
Troponin I: 0.01

## 2011-01-07 LAB — POCT CARDIAC MARKERS
CKMB, poc: 2.8
CKMB, poc: 3.4
CKMB, poc: 3.5
Myoglobin, poc: 147
Myoglobin, poc: 159
Myoglobin, poc: 187
Operator id: 146091
Operator id: 146091
Operator id: 294501
Troponin i, poc: <0.05
Troponin i, poc: <0.05
Troponin i, poc: <0.05

## 2011-01-07 LAB — DIFFERENTIAL
Basophils Absolute: 0
Basophils Absolute: 0.1
Basophils Relative: 1
Basophils Relative: 1
Eosinophils Absolute: 0.2
Eosinophils Absolute: 0.2
Eosinophils Relative: 4
Eosinophils Relative: 4
Lymphocytes Relative: 32
Lymphocytes Relative: 32
Lymphs Abs: 1.5
Lymphs Abs: 1.8
Monocytes Absolute: 0.4
Monocytes Absolute: 0.5
Monocytes Relative: 8
Monocytes Relative: 8
Neutro Abs: 2.6
Neutro Abs: 3.1
Neutrophils Relative %: 54
Neutrophils Relative %: 56

## 2011-01-07 LAB — COMPREHENSIVE METABOLIC PANEL
ALT: 24
AST: 18
Albumin: 3.9
Alkaline Phosphatase: 55
BUN: 9
CO2: 29
Calcium: 9.6
Chloride: 105
Creatinine, Ser: 1
GFR calc Af Amer: >60
GFR calc non Af Amer: >60
Glucose, Bld: 95
Potassium: 3.8
Sodium: 142
Total Bilirubin: 0.6
Total Protein: 7.1

## 2011-01-07 LAB — CK TOTAL AND CKMB (NOT AT ARMC)
CK, MB: 6.4 — ABNORMAL HIGH
CK, MB: 7 — ABNORMAL HIGH
Relative Index: 1.8
Relative Index: 2.1
Total CK: 335 — ABNORMAL HIGH
Total CK: 357 — ABNORMAL HIGH

## 2011-01-07 LAB — POCT I-STAT, CHEM 8
BUN: 9
Calcium, Ion: 1.12
Chloride: 107
Creatinine, Ser: 1.2
Glucose, Bld: 93
HCT: 41
Hemoglobin: 13.9
Potassium: 3.6
Sodium: 140
TCO2: 23

## 2011-01-07 LAB — PROTIME-INR
INR: 0.9
Prothrombin Time: 12.7

## 2011-01-07 LAB — APTT: aPTT: 36

## 2011-03-10 ENCOUNTER — Encounter (HOSPITAL_COMMUNITY): Payer: Self-pay | Admitting: *Deleted

## 2011-03-10 ENCOUNTER — Emergency Department (HOSPITAL_COMMUNITY): Payer: Medicare Other

## 2011-03-10 ENCOUNTER — Emergency Department (HOSPITAL_COMMUNITY)
Admission: EM | Admit: 2011-03-10 | Discharge: 2011-03-10 | Disposition: A | Discharge status: Home / Self Care | Payer: Medicare Other | Attending: Emergency Medicine | Admitting: Emergency Medicine

## 2011-03-10 DIAGNOSIS — R12 Heartburn: Secondary | ICD-10-CM | POA: Insufficient documentation

## 2011-03-10 DIAGNOSIS — K219 Gastro-esophageal reflux disease without esophagitis: Secondary | ICD-10-CM

## 2011-03-10 DIAGNOSIS — E785 Hyperlipidemia, unspecified: Secondary | ICD-10-CM | POA: Insufficient documentation

## 2011-03-10 DIAGNOSIS — R079 Chest pain, unspecified: Secondary | ICD-10-CM

## 2011-03-10 DIAGNOSIS — R1013 Epigastric pain: Secondary | ICD-10-CM | POA: Insufficient documentation

## 2011-03-10 DIAGNOSIS — Z79899 Other long term (current) drug therapy: Secondary | ICD-10-CM | POA: Insufficient documentation

## 2011-03-10 DIAGNOSIS — I10 Essential (primary) hypertension: Secondary | ICD-10-CM | POA: Insufficient documentation

## 2011-03-10 LAB — POCT I-STAT, CHEM 8
BUN: 13 mg/dL (ref 6–23)
Calcium, Ion: 1.19 mmol/L (ref 1.12–1.32)
Chloride: 103 mEq/L (ref 96–112)
Creatinine, Ser: 1.2 mg/dL (ref 0.50–1.35)
Glucose, Bld: 96 mg/dL (ref 70–99)
HCT: 42 % (ref 39.0–52.0)
Hemoglobin: 14.3 g/dL (ref 13.0–17.0)
Potassium: 4.6 mEq/L (ref 3.5–5.1)
Sodium: 141 mEq/L (ref 135–145)
TCO2: 27 mmol/L (ref 0–100)

## 2011-03-10 LAB — CBC
HCT: 39.1 % (ref 39.0–52.0)
Hemoglobin: 12.6 g/dL — ABNORMAL LOW (ref 13.0–17.0)
MCH: 23.1 pg — ABNORMAL LOW (ref 26.0–34.0)
MCHC: 32.2 g/dL (ref 30.0–36.0)
MCV: 71.7 fL — ABNORMAL LOW (ref 78.0–100.0)
Platelets: 149 10*3/uL — ABNORMAL LOW (ref 150–400)
RBC: 5.45 MIL/uL (ref 4.22–5.81)
RDW: 16.5 % — ABNORMAL HIGH (ref 11.5–15.5)
WBC: 5.4 10*3/uL (ref 4.0–10.5)

## 2011-03-10 LAB — POCT I-STAT TROPONIN I: Troponin i, poc: 0 ng/mL (ref 0.00–0.08)

## 2011-03-10 MED ORDER — ASPIRIN 81 MG PO CHEW: 324.0000 mg | CHEWABLE_TABLET | Freq: Once | ORAL | Status: DC

## 2011-03-10 MED ORDER — ALBUTEROL SULFATE HFA 108 (90 BASE) MCG/ACT IN AERS
1.0000 | INHALATION_SPRAY | Freq: Four times a day (QID) | RESPIRATORY_TRACT | Status: DC | PRN
Start: 2011-03-10 — End: 2011-05-29

## 2011-03-10 NOTE — ED Notes (Signed)
Pt states after he ate today approx 330 - he started to have epigastric pain - states the pain moved around the chest wall area - at present has no chest pain - only epigastric heart burn like pain. Is being treated with prilosec for same.

## 2011-03-10 NOTE — ED Notes (Signed)
Pt states MD advised him that he was ready for discharge.  Requested to be disconnected from all equipment so he can get dressed.  Complied with request - advised Pt that I was waiting on discharge paperwork.  Pt is getting dressed and verbalizes understanding of information.

## 2011-03-10 NOTE — ED Notes (Signed)
Pt began having epigstric pain today after eating.  Pt has hx of heartburn and this felt like heartburn.  Pt did have some sob with this.  No nausea, no diaphoresis.

## 2011-03-11 NOTE — ED Provider Notes (Signed)
History     CSN: [id number] Arrival date & time: 03/10/2011  6:09 PM   First MD Initiated Contact with Patient 03/10/11 2023      Chief Complaint  Patient presents with  . Abdominal Pain    (Consider location/radiation/quality/duration/timing/severity/associated sxs/prior treatment) HPI Comments: The patient is a 70 year old male with no cardiac history who presents for evaluation of epigastric and lower chest discomfort that arose after finishing a meal today at about 3:30 PM. He reports that he had a burning sensation in his epigastrium and retrosternal region like heartburn, but with "a feeling like something was moving under my shirt, like something was pushing out of my chest. I was very worried about the feeling that something was moving in my chest."  He denies dyspnea, diaphoresis, nausea, vomiting, or palpitations. Symptoms lasted for 20-30 minutes estimated, and has completely resolved at this time. He has not had similar symptoms previously. The discomfort was nonradiating. The discomfort was mild to moderate in severity.  Patient is a 70 y.o. male presenting with abdominal pain. The history is provided by the patient.  Abdominal Pain The primary symptoms of the illness include abdominal pain. The primary symptoms of the illness do not include fever, fatigue, shortness of breath, nausea, vomiting, diarrhea, hematemesis, hematochezia or dysuria. The current episode started 3 to 5 hours ago. The onset of the illness was sudden. The problem has been resolved.  The illness is associated with eating. The patient has not had a change in bowel habit. Additional symptoms associated with the illness include heartburn. Symptoms associated with the illness do not include chills, anorexia, diaphoresis, constipation, urgency, hematuria, frequency or back pain. Significant associated medical issues include GERD. Significant associated medical issues do not include PUD, inflammatory bowel disease or  substance abuse.    Past Medical History  Diagnosis Date  . GERD (gastroesophageal reflux disease)   . HLD (hyperlipidemia)   . HTN (hypertension)   . Tobacco abuse   . Cough secondary to angiotensin converting enzyme inhibitor (ACE-I)     Past Surgical History  Procedure Date  . Iliac artery aneurysm repair   . Circumcision     Family History  Problem Relation Age of Onset  . Breast cancer Mother   . COPD Father     History  Substance Use Topics  . Smoking status: Not on file  . Smokeless tobacco: Not on file  . Alcohol Use: No      Review of Systems  Constitutional: Negative for fever, chills, diaphoresis and fatigue.  HENT: Negative.   Eyes: Negative.   Respiratory: Negative for cough, choking, chest tightness and shortness of breath.   Cardiovascular: Negative for palpitations and leg swelling.  Gastrointestinal: Positive for heartburn and abdominal pain. Negative for nausea, vomiting, diarrhea, constipation, blood in stool, hematochezia, anorexia and hematemesis.  Genitourinary: Negative for dysuria, urgency, frequency and hematuria.  Musculoskeletal: Negative for back pain.  Skin: Negative.   Neurological: Negative for syncope, light-headedness and numbness.  Psychiatric/Behavioral: Negative.     Allergies  Lisinopril  Home Medications   Current Outpatient Rx  Name Route Sig Dispense Refill  . ATENOLOL 50 MG PO TABS Oral Take by mouth daily.     Marland Kitchen GABAPENTIN 300 MG PO CAPS Oral Take 300 mg by mouth daily.      Marland Kitchen LOSARTAN POTASSIUM-HCTZ 100-25 MG PO TABS Oral Take 1 tablet by mouth daily.      Marland Kitchen OMEPRAZOLE 20 MG PO CPDR Oral Take 20 mg by mouth  2 (two) times daily.      Marland Kitchen SIMVASTATIN 20 MG PO TABS Oral Take 20 mg by mouth at bedtime.      . TRAMADOL HCL 50 MG PO TABS Oral Take 50 mg by mouth every 6 (six) hours as needed. For pain. Maximum dose= 8 tablets per day     . ALBUTEROL SULFATE HFA 108 (90 BASE) MCG/ACT IN AERS Inhalation Inhale 1-2 puffs into  the lungs every 6 (six) hours as needed for wheezing. 1 Inhaler 0    BP 143/65  Pulse 47  Temp(Src) 97.8 F (36.6 C) (Oral)  Resp 13  SpO2 98%  Physical Exam  Nursing note and vitals reviewed. Constitutional: He is oriented to person, place, and time. He appears well-developed and well-nourished. No distress.  HENT:  Head: Normocephalic and atraumatic.  Mouth/Throat: Oropharynx is clear and moist.  Eyes: EOM are normal. Pupils are equal, round, and reactive to light.  Neck: Normal range of motion. Neck supple. No JVD present. No tracheal deviation present.  Cardiovascular: Normal rate, regular rhythm, S1 normal, S2 normal, normal heart sounds and intact distal pulses.   No extrasystoles are present. PMI is not displaced.  Exam reveals no gallop and no friction rub.   No murmur heard. Pulmonary/Chest: Effort normal and breath sounds normal. No accessory muscle usage or stridor. Not tachypneic. No respiratory distress. He has no decreased breath sounds. He has no wheezes. He has no rhonchi. He has no rales. He exhibits no tenderness, no bony tenderness, no crepitus and no retraction.  Abdominal: Soft. Bowel sounds are normal. He exhibits no distension and no mass. There is no tenderness. There is no rebound and no guarding.  Musculoskeletal: Normal range of motion. He exhibits no edema and no tenderness.  Neurological: He is alert and oriented to person, place, and time. No cranial nerve deficit. He exhibits normal muscle tone.  Skin: Skin is warm and dry. No rash noted. He is not diaphoretic. No erythema. No pallor.  Psychiatric: He has a normal mood and affect. His behavior is normal. Judgment and thought content normal.    ED Course  Procedures (including critical care time)  Labs Reviewed  CBC - Abnormal; Notable for the following:    Hemoglobin 12.6 (*)    MCV 71.7 (*)    MCH 23.1 (*)    RDW 16.5 (*)    Platelets 149 (*)    All other components within normal limits  POCT  I-STAT, CHEM 8  POCT I-STAT TROPONIN I  I-STAT TROPONIN I  I-STAT, CHEM 8   Dg Chest 2 View  03/10/2011  *RADIOLOGY REPORT*  Clinical Data:  Chest pain, shortness of breath and history of hypertension.  CHEST - 2 VIEW  Comparison: 10/30/2010  Findings: The heart size and mediastinal contours are within normal limits.  Both lungs are clear.  The visualized skeletal structures are unremarkable.  IMPRESSION: No active disease.  Original Report Authenticated By: Reola Calkins, M.D.     1. GERD (gastroesophageal reflux disease)   2. CHEST PAIN UNSPECIFIED      Date: 03/11/2011  Rate: 52  Rhythm: sinus bradycardia  QRS Axis: normal  Intervals: PR prolonged  ST/T Wave abnormalities: early repolarization and Left ventricular hypertrophy  Conduction Disutrbances:first-degree A-V block   Narrative Interpretation: Non-provocative EKG  Old EKG Reviewed: unchanged    MDM  Musculoskeletal chest pain, costochondritis, GERD, Gastrointestinal Chest Pain, Pleuritic Chest Pain, Pneumonia, Pneumothorax, Pulmonary Embolism, Esophageal Spasm, Arrhythmia considered among other potential  etiologies in the patient's differential diagnosis.  ACS, MI, cardiac chest pain are thought much less probable based on atypical features of discomfort and temporal relationship to taking place just         Felisa Bonier, MD 03/11/11 [phone]

## 2011-03-12 ENCOUNTER — Encounter: Payer: Self-pay | Admitting: Vascular Surgery

## 2011-03-19 ENCOUNTER — Emergency Department (HOSPITAL_COMMUNITY): Payer: Medicare Other

## 2011-03-19 ENCOUNTER — Emergency Department (HOSPITAL_COMMUNITY)
Admission: EM | Admit: 2011-03-19 | Discharge: 2011-03-20 | Disposition: A | Discharge status: Home / Self Care | Payer: Medicare Other | Attending: Emergency Medicine | Admitting: Emergency Medicine

## 2011-03-19 ENCOUNTER — Encounter (HOSPITAL_COMMUNITY): Payer: Self-pay | Admitting: Physical Medicine and Rehabilitation

## 2011-03-19 ENCOUNTER — Other Ambulatory Visit: Payer: Self-pay

## 2011-03-19 DIAGNOSIS — R05 Cough: Secondary | ICD-10-CM | POA: Insufficient documentation

## 2011-03-19 DIAGNOSIS — K219 Gastro-esophageal reflux disease without esophagitis: Secondary | ICD-10-CM | POA: Insufficient documentation

## 2011-03-19 DIAGNOSIS — R0602 Shortness of breath: Secondary | ICD-10-CM | POA: Insufficient documentation

## 2011-03-19 DIAGNOSIS — Z9889 Other specified postprocedural states: Secondary | ICD-10-CM | POA: Insufficient documentation

## 2011-03-19 DIAGNOSIS — E785 Hyperlipidemia, unspecified: Secondary | ICD-10-CM | POA: Insufficient documentation

## 2011-03-19 DIAGNOSIS — Z79899 Other long term (current) drug therapy: Secondary | ICD-10-CM | POA: Insufficient documentation

## 2011-03-19 DIAGNOSIS — F17200 Nicotine dependence, unspecified, uncomplicated: Secondary | ICD-10-CM | POA: Insufficient documentation

## 2011-03-19 DIAGNOSIS — R079 Chest pain, unspecified: Secondary | ICD-10-CM | POA: Insufficient documentation

## 2011-03-19 DIAGNOSIS — J40 Bronchitis, not specified as acute or chronic: Secondary | ICD-10-CM | POA: Insufficient documentation

## 2011-03-19 DIAGNOSIS — I10 Essential (primary) hypertension: Secondary | ICD-10-CM | POA: Insufficient documentation

## 2011-03-19 DIAGNOSIS — R059 Cough, unspecified: Secondary | ICD-10-CM | POA: Insufficient documentation

## 2011-03-19 LAB — DIFFERENTIAL
Basophils Absolute: 0 10*3/uL (ref 0.0–0.1)
Basophils Relative: 1 % (ref 0–1)
Eosinophils Absolute: 0.1 10*3/uL (ref 0.0–0.7)
Eosinophils Relative: 3 % (ref 0–5)
Lymphocytes Relative: 39 % (ref 12–46)
Lymphs Abs: 2.1 10*3/uL (ref 0.7–4.0)
Monocytes Absolute: 0.3 10*3/uL (ref 0.1–1.0)
Monocytes Relative: 6 % (ref 3–12)
Neutro Abs: 2.8 10*3/uL (ref 1.7–7.7)
Neutrophils Relative %: 52 % (ref 43–77)

## 2011-03-19 LAB — CBC
HCT: 40.5 % (ref 39.0–52.0)
Hemoglobin: 13 g/dL (ref 13.0–17.0)
MCH: 22.9 pg — ABNORMAL LOW (ref 26.0–34.0)
MCHC: 32.1 g/dL (ref 30.0–36.0)
MCV: 71.4 fL — ABNORMAL LOW (ref 78.0–100.0)
Platelets: 162 10*3/uL (ref 150–400)
RBC: 5.67 MIL/uL (ref 4.22–5.81)
RDW: 16.1 % — ABNORMAL HIGH (ref 11.5–15.5)
WBC: 5.4 10*3/uL (ref 4.0–10.5)

## 2011-03-19 LAB — BASIC METABOLIC PANEL
BUN: 14 mg/dL (ref 6–23)
CO2: 29 mEq/L (ref 19–32)
Calcium: 9.7 mg/dL (ref 8.4–10.5)
Chloride: 101 mEq/L (ref 96–112)
Creatinine, Ser: 1.17 mg/dL (ref 0.50–1.35)
GFR calc Af Amer: 71 mL/min — ABNORMAL LOW (ref >90)
GFR calc non Af Amer: 61 mL/min — ABNORMAL LOW (ref >90)
Glucose, Bld: 95 mg/dL (ref 70–99)
Potassium: 4 mEq/L (ref 3.5–5.1)
Sodium: 139 mEq/L (ref 135–145)

## 2011-03-19 MED ORDER — ALBUTEROL SULFATE (5 MG/ML) 0.5% IN NEBU
5.0000 mg | INHALATION_SOLUTION | Freq: Once | RESPIRATORY_TRACT | Status: AC
  Administered 2011-03-19: 5 mg via RESPIRATORY_TRACT
  Filled 2011-03-19: qty 1

## 2011-03-19 MED ORDER — ALBUTEROL SULFATE HFA 108 (90 BASE) MCG/ACT IN AERS
2.0000 | INHALATION_SPRAY | RESPIRATORY_TRACT | Status: DC | PRN
  Administered 2011-03-19: 2 via RESPIRATORY_TRACT
  Filled 2011-03-19: qty 6.7

## 2011-03-19 MED ORDER — IPRATROPIUM BROMIDE 0.02 % IN SOLN
0.5000 mg | Freq: Once | RESPIRATORY_TRACT | Status: AC
  Administered 2011-03-19: 0.5 mg via RESPIRATORY_TRACT
  Filled 2011-03-19: qty 2.5

## 2011-03-19 NOTE — ED Provider Notes (Signed)
History     CSN: [id number] Arrival date & time: 03/19/2011  7:04 PM   First MD Initiated Contact with Patient 03/19/11 2129      Chief Complaint  Patient presents with  . Shortness of Breath     HPI  History provided by the patient. Patient with history of hypertension, hyperlipidemia, and current smoker presents with complaints of increased exertional shortness of breath for the past 2 days. he states that symptoms are worse when he's up and about doing things. 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 Patient has noted a slight cough but denies any fever, chills, sweats. Patient denies any chest pain, palpitations, swelling in extremities. he denies any recent long travel, recent surgery, history of cancer, hemoptysis, or history of heart failure.   Past Medical History  Diagnosis Date  . GERD (gastroesophageal reflux disease)   . HLD (hyperlipidemia)   . HTN (hypertension)   . Tobacco abuse   . Cough secondary to angiotensin converting enzyme inhibitor (ACE-I)     Past Surgical History  Procedure Date  . Iliac artery aneurysm repair   . Circumcision   . Pr vein bypass graft,aorto-fem-pop 10/01/2009    left below knee popliteal artery to posterior tibial artery    Family History  Problem Relation Age of Onset  . Breast cancer Mother   . COPD Father     History  Substance Use Topics  . Smoking status: Current Everyday Smoker -- 1.0 packs/day for 55 years    Types: Cigarettes  . Smokeless tobacco: Current User  . Alcohol Use: No      Review of Systems  Constitutional: Negative for fever and chills.  HENT: Negative for congestion, sore throat and rhinorrhea.   Respiratory: Positive for cough and shortness of breath.   Cardiovascular: Negative for chest pain.  Gastrointestinal: Negative for nausea, vomiting, abdominal pain, diarrhea and constipation.  Neurological: Negative for light-headedness and  headaches.  All other systems reviewed and are negative.    Allergies  Lisinopril  Home Medications   Current Outpatient Rx  Name Route Sig Dispense Refill  . ALBUTEROL SULFATE HFA 108 (90 BASE) MCG/ACT IN AERS Inhalation Inhale 1-2 puffs into the lungs every 6 (six) hours as needed for wheezing. 1 Inhaler 0  . ATENOLOL 50 MG PO TABS Oral Take by mouth daily.     Marland Kitchen LOSARTAN POTASSIUM-HCTZ 100-25 MG PO TABS Oral Take 1 tablet by mouth daily.      Marland Kitchen OMEPRAZOLE 20 MG PO CPDR Oral Take 20 mg by mouth 2 (two) times daily.      Marland Kitchen SIMVASTATIN 20 MG PO TABS Oral Take 20 mg by mouth at bedtime.      . TRAMADOL HCL 50 MG PO TABS Oral Take 50 mg by mouth every 6 (six) hours as needed. For pain. Maximum dose= 8 tablets per day     . GABAPENTIN 300 MG PO CAPS Oral Take 300 mg by mouth daily.        BP 136/75  Pulse 50  Temp(Src) 98 F (36.7 C) (Oral)  Resp 16  SpO2 96%  Physical Exam  Nursing note and vitals reviewed. Constitutional: He is oriented to person, place, and time. He appears well-developed and well-nourished. No distress.  HENT:  Head: Normocephalic.  Right Ear: External ear normal.  Left Ear: External ear normal.  Mouth/Throat: Oropharynx is clear and moist.  Eyes: Conjunctivae and EOM are normal.  Pupils are equal, round, and reactive to light.  Cardiovascular: Normal rate, regular rhythm and normal heart sounds.   Pulmonary/Chest: Effort normal and breath sounds normal. No respiratory distress. He has no wheezes. He has no rales. He exhibits no tenderness.  Abdominal: Soft. There is no tenderness.  Musculoskeletal: He exhibits no edema and no tenderness.       No clinical signs for DVT  Neurological: He is alert and oriented to person, place, and time.  Skin: Skin is warm. No rash noted.  Psychiatric: He has a normal mood and affect. His behavior is normal.    ED Course  Procedures (including critical care time)  Labs Reviewed  CBC - Abnormal; Notable for the  following:    MCV 71.4 (*)    MCH 22.9 (*)    RDW 16.1 (*)    All other components within normal limits  BASIC METABOLIC PANEL - Abnormal; Notable for the following:    GFR calc non Af Amer 61 (*)    GFR calc Af Amer 71 (*)    All other components within normal limits  DIFFERENTIAL   Results for orders placed during the hospital encounter of 03/19/11  CBC      Component Value Range   WBC 5.4  4.0 - 10.5 (K/uL)   RBC 5.67  4.22 - 5.81 (MIL/uL)   Hemoglobin 13.0  13.0 - 17.0 (g/dL)   HCT 16.1  09.6 - 04.5 (%)   MCV 71.4 (*) 78.0 - 100.0 (fL)   MCH 22.9 (*) 26.0 - 34.0 (pg)   MCHC 32.1  30.0 - 36.0 (g/dL)   RDW 40.9 (*) 81.1 - 15.5 (%)   Platelets 162  150 - 400 (K/uL)  DIFFERENTIAL      Component Value Range   Neutrophils Relative 52  43 - 77 (%)   Neutro Abs 2.8  1.7 - 7.7 (K/uL)   Lymphocytes Relative 39  12 - 46 (%)   Lymphs Abs 2.1  0.7 - 4.0 (K/uL)   Monocytes Relative 6  3 - 12 (%)   Monocytes Absolute 0.3  0.1 - 1.0 (K/uL)   Eosinophils Relative 3  0 - 5 (%)   Eosinophils Absolute 0.1  0.0 - 0.7 (K/uL)   Basophils Relative 1  0 - 1 (%)   Basophils Absolute 0.0  0.0 - 0.1 (K/uL)  BASIC METABOLIC PANEL      Component Value Range   Sodium 139  135 - 145 (mEq/L)   Potassium 4.0  3.5 - 5.1 (mEq/L)   Chloride 101  96 - 112 (mEq/L)   CO2 29  19 - 32 (mEq/L)   Glucose, Bld 95  70 - 99 (mg/dL)   BUN 14  6 - 23 (mg/dL)   Creatinine, Ser 9.14  0.50 - 1.35 (mg/dL)   Calcium 9.7  8.4 - 78.2 (mg/dL)   GFR calc non Af Amer 61 (*) >90 (mL/min)   GFR calc Af Amer 71 (*) >90 (mL/min)     Dg Chest 2 View  03/19/2011  *RADIOLOGY REPORT*  Clinical Data: Shortness of breath.  Chest discomfort.  CHEST - 2 VIEW 03/19/2011:  Comparison: Two-view chest x-ray 03/10/2011, 10/30/2010 [facility], and 02/25/2010 [facility].  Findings: Cardiac silhouette normal in size, unchanged.  Thoracic aorta mildly atherosclerotic consistent with age, unchanged.  Hilar and mediastinal contours  unremarkable.  Lungs clear. Bronchovascular markings normal.  No pleural effusions. Degenerative changes involving the thoracic spine.  No significant interval change.  IMPRESSION:  No acute cardiopulmonary disease.  Stable examination.  Original Report Authenticated By: Arnell Sieving, M.D.     1. Shortness of breath   2. Bronchitis       MDM  9:30 PM patient seen and evaluated. Patient in no acute distress. With normal respirations and good O2 sats.    10:30 p.m. patient reports significant improvement of symptoms after breathing treatment. He states "seems to have cleared up".   11:30 PM chest x-ray, ECG, lab work today has been unremarkable with no signs for acute or concerning cause of symptoms. At this time will plan to provide albuterol inhaler for patient to use her symptoms. Suspect mild bronchitis or COPD related to tobacco use. Patient will call PCP on Monday.    Date: 03/19/2011  Rate: 50  Rhythm: normal sinus rhythm  QRS Axis: normal  Intervals: normal  ST/T Wave abnormalities: normal  Conduction Disutrbances:none  Narrative Interpretation: Left ventricular hypertrophy  Old EKG Reviewed: unchanged from 03/10/2011    [name] Seller, PA 03/20/11 1911

## 2011-03-19 NOTE — ED Notes (Signed)
Pt presents to department for evaluation of SOB. Ongoing x3 days. Pt states he becomes short of breath with movement and walking. Also state he feel weak. Respirations unlabored upon arrival. Skin warm and dry. He is alert and oriented x4. No signs of distress. Ambulatory to triage.

## 2011-03-20 NOTE — ED Provider Notes (Signed)
Medical screening examination/treatment/procedure(s) were performed by non-physician practitioner and as supervising physician I was immediately available for consultation/collaboration.   Trellis Guirguis P Mikayela Deats, MD 03/20/11 2305 

## 2011-03-20 NOTE — ED Notes (Signed)
Rx x 0.  Pt voiced understanding to keep appt with MD and f/u with smoking cessation.

## 2011-04-14 ENCOUNTER — Other Ambulatory Visit: Payer: Medicare Other

## 2011-04-14 ENCOUNTER — Ambulatory Visit: Payer: Medicare Other | Admitting: Vascular Surgery

## 2011-05-11 ENCOUNTER — Encounter: Payer: Self-pay | Admitting: Vascular Surgery

## 2011-05-12 ENCOUNTER — Ambulatory Visit: Payer: Medicare Other | Admitting: Vascular Surgery

## 2011-05-17 ENCOUNTER — Other Ambulatory Visit: Payer: Self-pay

## 2011-05-17 ENCOUNTER — Encounter (HOSPITAL_COMMUNITY): Payer: Self-pay

## 2011-05-17 ENCOUNTER — Emergency Department (HOSPITAL_COMMUNITY): Payer: Medicare Other

## 2011-05-17 ENCOUNTER — Emergency Department (HOSPITAL_COMMUNITY)
Admission: EM | Admit: 2011-05-17 | Discharge: 2011-05-17 | Disposition: A | Discharge status: Home / Self Care | Payer: Medicare Other | Attending: Emergency Medicine | Admitting: Emergency Medicine

## 2011-05-17 DIAGNOSIS — J4489 Other specified chronic obstructive pulmonary disease: Secondary | ICD-10-CM | POA: Insufficient documentation

## 2011-05-17 DIAGNOSIS — R6884 Jaw pain: Secondary | ICD-10-CM | POA: Insufficient documentation

## 2011-05-17 DIAGNOSIS — M79609 Pain in unspecified limb: Secondary | ICD-10-CM | POA: Insufficient documentation

## 2011-05-17 DIAGNOSIS — I10 Essential (primary) hypertension: Secondary | ICD-10-CM | POA: Insufficient documentation

## 2011-05-17 DIAGNOSIS — R079 Chest pain, unspecified: Secondary | ICD-10-CM | POA: Insufficient documentation

## 2011-05-17 DIAGNOSIS — R209 Unspecified disturbances of skin sensation: Secondary | ICD-10-CM | POA: Insufficient documentation

## 2011-05-17 DIAGNOSIS — J449 Chronic obstructive pulmonary disease, unspecified: Secondary | ICD-10-CM | POA: Insufficient documentation

## 2011-05-17 DIAGNOSIS — F17200 Nicotine dependence, unspecified, uncomplicated: Secondary | ICD-10-CM | POA: Insufficient documentation

## 2011-05-17 DIAGNOSIS — E785 Hyperlipidemia, unspecified: Secondary | ICD-10-CM | POA: Insufficient documentation

## 2011-05-17 DIAGNOSIS — K219 Gastro-esophageal reflux disease without esophagitis: Secondary | ICD-10-CM | POA: Insufficient documentation

## 2011-05-17 HISTORY — DX: Unspecified injury of muscle(s) and tendon(s) of the rotator cuff of unspecified shoulder, initial encounter: S46.009A

## 2011-05-17 LAB — CBC
HCT: 38.1 % — ABNORMAL LOW (ref 39.0–52.0)
HCT: 39 % (ref 39.0–52.0)
Hemoglobin: 12.5 g/dL — ABNORMAL LOW (ref 13.0–17.0)
Hemoglobin: 12.6 g/dL — ABNORMAL LOW (ref 13.0–17.0)
MCH: 23.1 pg — ABNORMAL LOW (ref 26.0–34.0)
MCH: 22.9 pg — ABNORMAL LOW (ref 26.0–34.0)
MCHC: 32.8 g/dL (ref 30.0–36.0)
MCHC: 32.3 g/dL (ref 30.0–36.0)
MCV: 70.4 fL — ABNORMAL LOW (ref 78.0–100.0)
MCV: 70.9 fL — ABNORMAL LOW (ref 78.0–100.0)
Platelets: 164 10*3/uL (ref 150–400)
Platelets: 167 10*3/uL (ref 150–400)
RBC: 5.41 MIL/uL (ref 4.22–5.81)
RBC: 5.5 MIL/uL (ref 4.22–5.81)
RDW: 16.1 % — ABNORMAL HIGH (ref 11.5–15.5)
RDW: 16.1 % — ABNORMAL HIGH (ref 11.5–15.5)
WBC: 5.3 10*3/uL (ref 4.0–10.5)
WBC: 4.8 10*3/uL (ref 4.0–10.5)

## 2011-05-17 LAB — COMPREHENSIVE METABOLIC PANEL
ALT: 17 U/L (ref 0–53)
AST: 15 U/L (ref 0–37)
Albumin: 3.5 g/dL (ref 3.5–5.2)
Alkaline Phosphatase: 73 U/L (ref 39–117)
BUN: 13 mg/dL (ref 6–23)
CO2: 28 mEq/L (ref 19–32)
Calcium: 9.3 mg/dL (ref 8.4–10.5)
Chloride: 101 mEq/L (ref 96–112)
Creatinine, Ser: 1.26 mg/dL (ref 0.50–1.35)
GFR calc Af Amer: 65 mL/min — ABNORMAL LOW (ref >90)
GFR calc non Af Amer: 56 mL/min — ABNORMAL LOW (ref >90)
Glucose, Bld: 89 mg/dL (ref 70–99)
Potassium: 4.2 mEq/L (ref 3.5–5.1)
Sodium: 136 mEq/L (ref 135–145)
Total Bilirubin: 0.2 mg/dL — ABNORMAL LOW (ref 0.3–1.2)
Total Protein: 7.1 g/dL (ref 6.0–8.3)

## 2011-05-17 LAB — TROPONIN I: Troponin I: <0.3 ng/mL (ref <0.30)

## 2011-05-17 LAB — DIFFERENTIAL
Basophils Absolute: 0 10*3/uL (ref 0.0–0.1)
Basophils Relative: 0 % (ref 0–1)
Eosinophils Absolute: 0.2 10*3/uL (ref 0.0–0.7)
Eosinophils Relative: 4 % (ref 0–5)
Lymphocytes Relative: 38 % (ref 12–46)
Lymphs Abs: 1.8 10*3/uL (ref 0.7–4.0)
Monocytes Absolute: 0.4 10*3/uL (ref 0.1–1.0)
Monocytes Relative: 9 % (ref 3–12)
Neutro Abs: 2.4 10*3/uL (ref 1.7–7.7)
Neutrophils Relative %: 50 % (ref 43–77)

## 2011-05-17 LAB — POCT I-STAT TROPONIN I: Troponin i, poc: 0 ng/mL (ref 0.00–0.08)

## 2011-05-17 LAB — PROTIME-INR
INR: 0.99 (ref 0.00–1.49)
Prothrombin Time: 13.3 seconds (ref 11.6–15.2)

## 2011-05-17 MED ORDER — MORPHINE SULFATE 4 MG/ML IJ SOLN: 4.0000 mg | INTRAMUSCULAR | Status: DC | PRN

## 2011-05-17 MED ORDER — ASPIRIN 81 MG PO CHEW: 324.0000 mg | CHEWABLE_TABLET | Freq: Once | ORAL | Status: AC

## 2011-05-17 NOTE — ED Notes (Signed)
sts chest pain since this morning, lips feel numb and also complains of numbness in left arm and at present pain as subsided.

## 2011-05-17 NOTE — ED Provider Notes (Signed)
History     CSN: [id number]  Arrival date & time 05/17/11  1026   First MD Initiated Contact with Patient 05/17/11 1122    HPI Patient reports waking up this morning at 7:30 with a left-sided chest pressure. Reports pain radiated down left arm and associated with left upper lip numbness. Reports a significant history of hypertension, hyperlipidemia, and cigarette smoking. Denies shortness breath, nausea, diaphoresis. Denies history of cardiac disease. Patient reports taking 2 aspirin and one nitroglycerin with moderate relief. Patient is a 71 y.o. male presenting with chest pain. The history is provided by the patient.  Chest Pain The chest pain began 3 - 5 days ago. Chest pain occurs constantly. The chest pain is unchanged. The severity of the pain is moderate. The pain radiates to the left jaw. Pertinent negatives for primary symptoms include no fever, no fatigue, no shortness of breath, no abdominal pain, no nausea, no vomiting, no dizziness and no altered mental status.  Associated symptoms include numbness.  Pertinent negatives for associated symptoms include no claudication, no diaphoresis, no near-syncope and no weakness. He tried nitroglycerin and aspirin for the symptoms. Risk factors include male gender, smoking/tobacco exposure and being elderly.  His past medical history is significant for COPD, hyperlipidemia and hypertension.  Pertinent negatives for past medical history include no CAD, no cancer, no CHF, no diabetes, no DVT, no MI and no PE.  Procedure history is negative for cardiac catheterization, echocardiogram, stress echo and exercise treadmill test.     Patient is a 71 y.o. male presenting with chest pain. The history is provided by the patient.  Chest Pain The chest pain began 3 - 5 days ago. Chest pain occurs constantly. The chest pain is unchanged. The severity of the pain is moderate. The pain radiates to the left jaw. Pertinent negatives for primary symptoms include no  fever, no fatigue, no shortness of breath, no abdominal pain, no nausea, no vomiting, no dizziness and no altered mental status.  Associated symptoms include numbness.  Pertinent negatives for associated symptoms include no claudication, no diaphoresis, no near-syncope and no weakness. He tried nitroglycerin and aspirin for the symptoms. Risk factors include male gender, smoking/tobacco exposure and being elderly.  His past medical history is significant for COPD, hyperlipidemia and hypertension.  Pertinent negatives for past medical history include no CAD, no cancer, no CHF, no diabetes, no DVT, no MI and no PE.  Procedure history is negative for cardiac catheterization, echocardiogram, stress echo and exercise treadmill test.     Past Medical History  Diagnosis Date  . GERD (gastroesophageal reflux disease)   . HLD (hyperlipidemia)   . HTN (hypertension)   . Tobacco abuse   . Cough secondary to angiotensin converting enzyme inhibitor (ACE-I)   . Rotator cuff injury     Past Surgical History  Procedure Date  . Iliac artery aneurysm repair   . Circumcision   . Pr vein bypass graft,aorto-fem-pop 10/01/2009    left below knee popliteal artery to posterior tibial artery    Family History  Problem Relation Age of Onset  . Breast cancer Mother   . COPD Father     History  Substance Use Topics  . Smoking status: Current Everyday Smoker -- 1.0 packs/day for 55 years    Types: Cigarettes  . Smokeless tobacco: Current User  . Alcohol Use: No      Review of Systems  Constitutional: Negative for fever, chills, diaphoresis and fatigue.  HENT: Negative for sore throat.  Respiratory: Negative for shortness of breath.   Cardiovascular: Positive for chest pain. Negative for claudication and near-syncope.  Gastrointestinal: Negative for nausea, vomiting and abdominal pain.  Musculoskeletal: Negative for back pain.  Neurological: Positive for numbness. Negative for dizziness, weakness  and headaches.  Psychiatric/Behavioral: Negative for altered mental status.  All other systems reviewed and are negative.    Allergies  Lisinopril  Home Medications   Current Outpatient Rx  Name Route Sig Dispense Refill  . ALBUTEROL SULFATE HFA 108 (90 BASE) MCG/ACT IN AERS Inhalation Inhale 1-2 puffs into the lungs every 6 (six) hours as needed for wheezing. 1 Inhaler 0  . ASPIRIN EC 325 MG PO TBEC Oral Take 325 mg by mouth daily.    . ATENOLOL 50 MG PO TABS Oral Take by mouth daily.     Marland Kitchen FERROUS SULFATE 325 (65 FE) MG PO TABS Oral Take 325 mg by mouth daily with breakfast.    . GABAPENTIN 300 MG PO CAPS Oral Take 300 mg by mouth daily as needed. For headaches    . GERITOL TONIC PO LIQD Oral Take 30 mLs by mouth daily.    Marland Kitchen LOSARTAN POTASSIUM-HCTZ 100-25 MG PO TABS Oral Take 1 tablet by mouth daily.      Marland Kitchen OMEPRAZOLE 20 MG PO CPDR Oral Take 20 mg by mouth 2 (two) times daily.      Marland Kitchen SIMVASTATIN 20 MG PO TABS Oral Take 20 mg by mouth at bedtime.      . TRAMADOL HCL 50 MG PO TABS Oral Take 50 mg by mouth every 6 (six) hours as needed. For pain. Maximum dose= 8 tablets per day      BP 115/61  Pulse 47  Temp(Src) 97.4 F (36.3 C) (Oral)  Resp 13  SpO2 100%  Physical Exam  Vitals reviewed. Constitutional: He is oriented to person, place, and time. He appears well-developed and well-nourished.  HENT:  Head: Normocephalic and atraumatic.  Eyes: Conjunctivae are normal. Pupils are equal, round, and reactive to light.  Neck: Normal range of motion. Neck supple.  Cardiovascular: Normal rate, regular rhythm and normal heart sounds.  Exam reveals no gallop and no friction rub.   No murmur heard. Pulmonary/Chest: Effort normal and breath sounds normal. He has no wheezes. He has no rales. He exhibits no tenderness.  Abdominal: Soft. Bowel sounds are normal. He exhibits no distension and no mass. There is no rebound and no guarding.  Neurological: He is alert and oriented to person,  place, and time.  Skin: Skin is warm and dry. No rash noted. No erythema. No pallor.  Psychiatric: He has a normal mood and affect. His behavior is normal.    ED Course  Procedures   Results for orders placed during the hospital encounter of 05/17/11  COMPREHENSIVE METABOLIC PANEL      Component Value Range   Sodium 136  135 - 145 (mEq/L)   Potassium 4.2  3.5 - 5.1 (mEq/L)   Chloride 101  96 - 112 (mEq/L)   CO2 28  19 - 32 (mEq/L)   Glucose, Bld 89  70 - 99 (mg/dL)   BUN 13  6 - 23 (mg/dL)   Creatinine, Ser 1.61  0.50 - 1.35 (mg/dL)   Calcium 9.3  8.4 - 09.6 (mg/dL)   Total Protein 7.1  6.0 - 8.3 (g/dL)   Albumin 3.5  3.5 - 5.2 (g/dL)   AST 15  0 - 37 (U/L)   ALT 17  0 - 53 (  U/L)   Alkaline Phosphatase 73  39 - 117 (U/L)   Total Bilirubin 0.2 (*) 0.3 - 1.2 (mg/dL)   GFR calc non Af Amer 56 (*) >90 (mL/min)   GFR calc Af Amer 65 (*) >90 (mL/min)  CBC      Component Value Range   WBC 5.3  4.0 - 10.5 (K/uL)   RBC 5.41  4.22 - 5.81 (MIL/uL)   Hemoglobin 12.5 (*) 13.0 - 17.0 (g/dL)   HCT 16.1 (*) 09.6 - 52.0 (%)   MCV 70.4 (*) 78.0 - 100.0 (fL)   MCH 23.1 (*) 26.0 - 34.0 (pg)   MCHC 32.8  30.0 - 36.0 (g/dL)   RDW 04.5 (*) 40.9 - 15.5 (%)   Platelets 164  150 - 400 (K/uL)  TROPONIN I      Component Value Range   Troponin I <0.30  <0.30 (ng/mL)  PROTIME-INR      Component Value Range   Prothrombin Time 13.3  11.6 - 15.2 (seconds)   INR 0.99  0.00 - 1.49    Dg Chest 2 View  05/17/2011  *RADIOLOGY REPORT*  Clinical Data: The head and left-sided chest pain.  CHEST - 2 VIEW  Comparison: 03/19/2011.  Findings: The lungs are clear.  The heart and mediastinal structures are normal.  Thoracic osteophytosis is noted.  IMPRESSION: No evidence for active chest disease.  Original Report Authenticated By: Rolla Plate, M.D.     ED ECG REPORT   Date: 05/17/2011  EKG Time: 12:32 PM  Rate: 50  Rhythm: sinus bradycardia,  normal EKG, normal sinus rhythm, unchanged from previous  tracings, 1st degree AV block  Axis: nml  Intervals:first-degree A-V block   ST&T Change: Early Repolarization  Narrative Interpretation: Unchanged since 03/19/11  MDM    Spoke with [facility] hospitalist Who will admit patient for CP observation    Thomasene Lot, PA-C 05/17/11 1351    Phlebotomists tried to order seconds cardiac marker the patient refused. Went in to discuss admission with patient. Patient states he is now and will sign out AMA.  Thomasene Lot, PA-C 05/17/11 1456

## 2011-05-18 NOTE — ED Provider Notes (Signed)
Medical screening examination/treatment/procedure(s) were performed by non-physician practitioner and as supervising physician I was immediately available for consultation/collaboration.   Veora Fonte, MD 05/18/11 0729 

## 2011-05-29 ENCOUNTER — Other Ambulatory Visit: Payer: Self-pay

## 2011-05-29 ENCOUNTER — Emergency Department (HOSPITAL_COMMUNITY): Payer: Medicare Other

## 2011-05-29 ENCOUNTER — Encounter (HOSPITAL_COMMUNITY): Payer: Self-pay | Admitting: Emergency Medicine

## 2011-05-29 ENCOUNTER — Inpatient Hospital Stay (HOSPITAL_COMMUNITY)
Admission: EM | Admit: 2011-05-29 | Discharge: 2011-06-02 | DRG: 287 | Disposition: A | Discharge status: Home / Self Care | Payer: Medicare Other | Attending: Internal Medicine | Admitting: Internal Medicine

## 2011-05-29 DIAGNOSIS — I10 Essential (primary) hypertension: Secondary | ICD-10-CM | POA: Diagnosis present

## 2011-05-29 DIAGNOSIS — F17200 Nicotine dependence, unspecified, uncomplicated: Secondary | ICD-10-CM | POA: Diagnosis present

## 2011-05-29 DIAGNOSIS — K219 Gastro-esophageal reflux disease without esophagitis: Secondary | ICD-10-CM | POA: Diagnosis present

## 2011-05-29 DIAGNOSIS — E785 Hyperlipidemia, unspecified: Secondary | ICD-10-CM | POA: Diagnosis present

## 2011-05-29 DIAGNOSIS — E7800 Pure hypercholesterolemia, unspecified: Secondary | ICD-10-CM | POA: Diagnosis present

## 2011-05-29 DIAGNOSIS — Z87891 Personal history of nicotine dependence: Secondary | ICD-10-CM | POA: Diagnosis present

## 2011-05-29 DIAGNOSIS — I739 Peripheral vascular disease, unspecified: Secondary | ICD-10-CM | POA: Diagnosis present

## 2011-05-29 DIAGNOSIS — R0789 Other chest pain: Secondary | ICD-10-CM

## 2011-05-29 DIAGNOSIS — I2510 Atherosclerotic heart disease of native coronary artery without angina pectoris: Principal | ICD-10-CM | POA: Diagnosis present

## 2011-05-29 DIAGNOSIS — I2582 Chronic total occlusion of coronary artery: Secondary | ICD-10-CM | POA: Diagnosis present

## 2011-05-29 DIAGNOSIS — Z23 Encounter for immunization: Secondary | ICD-10-CM

## 2011-05-29 HISTORY — DX: Peripheral vascular disease, unspecified: I73.9

## 2011-05-29 LAB — BASIC METABOLIC PANEL
BUN: 12 mg/dL (ref 6–23)
CO2: 26 mEq/L (ref 19–32)
Calcium: 9.5 mg/dL (ref 8.4–10.5)
Chloride: 104 mEq/L (ref 96–112)
Creatinine, Ser: 1.23 mg/dL (ref 0.50–1.35)
GFR calc Af Amer: 66 mL/min — ABNORMAL LOW (ref >90)
GFR calc non Af Amer: 57 mL/min — ABNORMAL LOW (ref >90)
Glucose, Bld: 99 mg/dL (ref 70–99)
Potassium: 3.5 mEq/L (ref 3.5–5.1)
Sodium: 141 mEq/L (ref 135–145)

## 2011-05-29 LAB — CBC
HCT: 36 % — ABNORMAL LOW (ref 39.0–52.0)
Hemoglobin: 11.9 g/dL — ABNORMAL LOW (ref 13.0–17.0)
MCH: 23.1 pg — ABNORMAL LOW (ref 26.0–34.0)
MCHC: 33.1 g/dL (ref 30.0–36.0)
MCV: 69.8 fL — ABNORMAL LOW (ref 78.0–100.0)
Platelets: 157 10*3/uL (ref 150–400)
RBC: 5.16 MIL/uL (ref 4.22–5.81)
RDW: 16.1 % — ABNORMAL HIGH (ref 11.5–15.5)
WBC: 5.4 10*3/uL (ref 4.0–10.5)

## 2011-05-29 LAB — TROPONIN I: Troponin I: <0.3 ng/mL (ref <0.30)

## 2011-05-29 LAB — POCT I-STAT TROPONIN I: Troponin i, poc: 0.01 ng/mL (ref 0.00–0.08)

## 2011-05-29 NOTE — ED Notes (Signed)
Patient reports intermitant chest pain has been seen 3 times for this pain but not going away. States it is left sided that moves substernal pain starts at rest and pain ranges from 3/10-8/10. No nausea

## 2011-05-29 NOTE — ED Provider Notes (Signed)
Medical screening examination/treatment/procedure(s) were performed by non-physician practitioner and as supervising physician I was immediately available for consultation/collaboration.   Angelize Ryce L Mohsin Crum, MD 05/29/11 2349 

## 2011-05-29 NOTE — ED Provider Notes (Signed)
History     CSN: [id number]  Arrival date & time 05/29/11  1844   First MD Initiated Contact with Patient 05/29/11 1937      Chief Complaint  Patient presents with  . Chest Pain    (Consider location/radiation/quality/duration/timing/severity/associated sxs/prior treatment) Patient is a 71 y.o. male presenting with chest pain. The history is provided by the patient.  Chest Pain Primary symptoms include cough. Pertinent negatives for primary symptoms include no shortness of breath, no wheezing, no palpitations, no abdominal pain, no nausea, no vomiting and no dizziness.  Pertinent negatives for associated symptoms include no diaphoresis and no weakness.    Patient presents with chest pain. He states this has been intermittent in nature for the past several weeks. He is unable to describe what the pain feels like but states it is located in the left side of his chest. It does not radiate. He notes that the pain seems to increase when he is sitting down, and seems to improve with movement and activity. He states that the pain is not reproducible when he presses on the area. He denies any associated dyspnea, diaphoresis, nausea, vomiting, abdominal pain. He has a history of hyperlipidemia and hypertension, but denies any known history of coronary artery disease. He does have a history of peripheral artery disease. He is a smoker.  A review of the patient's previous chart indicates that he was seen here for the same on February 4. It was recommended that he stay for chest pain observation, but the patient declined, stating that he had something to take care of at home.  Past Medical History  Diagnosis Date  . GERD (gastroesophageal reflux disease)   . HLD (hyperlipidemia)   . HTN (hypertension)   . Tobacco abuse   . Cough secondary to angiotensin converting enzyme inhibitor (ACE-I)   . Rotator cuff injury     Past Surgical History  Procedure Date  . Iliac artery aneurysm repair   .  Circumcision   . Pr vein bypass graft,aorto-fem-pop 10/01/2009    left below knee popliteal artery to posterior tibial artery    Family History  Problem Relation Age of Onset  . Breast cancer Mother   . COPD Father     History  Substance Use Topics  . Smoking status: Current Everyday Smoker -- 1.0 packs/day for 55 years    Types: Cigarettes  . Smokeless tobacco: Current User  . Alcohol Use: No      Review of Systems  Constitutional: Negative.  Negative for diaphoresis.  HENT: Negative for congestion and rhinorrhea.   Eyes: Negative for discharge.  Respiratory: Positive for cough. Negative for chest tightness, shortness of breath and wheezing.   Cardiovascular: Positive for chest pain. Negative for palpitations and leg swelling.  Gastrointestinal: Negative for nausea, vomiting and abdominal pain.  Genitourinary: Negative.   Musculoskeletal: Negative for myalgias.  Skin: Negative.   Neurological: Negative for dizziness, syncope and weakness.    Allergies  Lisinopril  Home Medications   Current Outpatient Rx  Name Route Sig Dispense Refill  . ALBUTEROL SULFATE HFA 108 (90 BASE) MCG/ACT IN AERS Inhalation Inhale 1-2 puffs into the lungs every 6 (six) hours as needed. For shortness of breath    . ASPIRIN EC 325 MG PO TBEC Oral Take 325 mg by mouth daily.    . ATENOLOL 50 MG PO TABS Oral Take by mouth daily.     Marland Kitchen FERROUS SULFATE 325 (65 FE) MG PO TABS Oral Take 325 mg  by mouth daily with breakfast.    . GABAPENTIN 300 MG PO CAPS Oral Take 300 mg by mouth daily as needed. For headaches    . GERITOL TONIC PO LIQD Oral Take 30 mLs by mouth daily.    Marland Kitchen LOSARTAN POTASSIUM-HCTZ 100-25 MG PO TABS Oral Take 1 tablet by mouth daily.      Marland Kitchen OMEPRAZOLE 20 MG PO CPDR Oral Take 20 mg by mouth 2 (two) times daily.      Marland Kitchen SIMVASTATIN 20 MG PO TABS Oral Take 20 mg by mouth at bedtime.      . TRAMADOL HCL 50 MG PO TABS Oral Take 50 mg by mouth every 6 (six) hours as needed. For pain.  Maximum dose= 8 tablets per day      BP 157/76  Pulse 78  Temp(Src) 97.1 F (36.2 C) (Oral)  Resp 18  SpO2 100%  Physical Exam  Nursing note and vitals reviewed. Constitutional: He is oriented to person, place, and time. He appears well-developed and well-nourished. No distress.  HENT:  Head: Normocephalic and atraumatic.  Eyes: Conjunctivae and EOM are normal. Pupils are equal, round, and reactive to light.  Neck: Normal range of motion.  Cardiovascular: Regular rhythm and normal heart sounds.  Bradycardia present.  Exam reveals no gallop and no friction rub.   No murmur heard. Pulmonary/Chest: Effort normal and breath sounds normal. He exhibits no tenderness.  Abdominal: Soft. Bowel sounds are normal. There is no tenderness. There is no rebound and no guarding.  Musculoskeletal: Normal range of motion. He exhibits no edema and no tenderness.       No LE edema noted  Neurological: He is alert and oriented to person, place, and time.  Skin: Skin is warm and dry. He is not diaphoretic.  Psychiatric: He has a normal mood and affect.    ED Course  Procedures (including critical care time)   Date: 05/29/2011  Rate: 55  Rhythm: sinus bradycardia  QRS Axis: normal  Intervals: PR prolonged  ST/T Wave abnormalities: normal  Conduction Disutrbances:first-degree A-V block   Narrative Interpretation: Sinus brady with 1st deg AVB. He is on atenolol which likely explains bradycardia.  Old EKG Reviewed: 05/17/11: STE in leads V2, V3, poss early repol. Not present today.    Labs Reviewed  CBC - Abnormal; Notable for the following:    Hemoglobin 11.9 (*)    HCT 36.0 (*)    MCV 69.8 (*)    MCH 23.1 (*)    RDW 16.1 (*)    All other components within normal limits  BASIC METABOLIC PANEL - Abnormal; Notable for the following:    GFR calc non Af Amer 57 (*)    GFR calc Af Amer 66 (*)    All other components within normal limits  TROPONIN I  POCT I-STAT TROPONIN I   Dg Chest 2  View  05/29/2011  *RADIOLOGY REPORT*  Clinical Data: Left-sided chest pain and shortness of breath.  CHEST - 2 VIEW  Comparison: 05/17/2011  Findings: Two views of the chest demonstrate clear lungs. Heart and mediastinum are within normal limits.  Degenerative endplate changes in the thoracic spine.  No evidence for edema or pleural effusions.  IMPRESSION: No acute chest findings.  Original Report Authenticated By: Richarda Overlie, M.D.   I personally reviewed the pt's CXR.   No diagnosis found.    MDM  Patient with several weeks of intermittent left chest pain. He has several risk factors for CAD, including hypertension,  hyperlipidemia, tobacco abuse. HEART score of 3. His initial troponin was negative, and his lab work is otherwise unremarkable. His chest x-ray does not show acute findings. Plan to place him on the CDU chest pain protocol for a cardiac CTA in the morning. The patient was agreeable to this plan.        Grant Fontana, [place] 05/29/11 2322

## 2011-05-29 NOTE — ED Provider Notes (Signed)
Pt complains of chest pain.  pe lungs clear heart rrr   Dx chest pain will admit to chest pain center  Medical screening examination/treatment/procedure(s) were conducted as a shared visit with non-physician practitioner(s) and myself.  I personally evaluated the patient during the encounter   Benny Lennert, MD 05/29/11 [phone]

## 2011-05-29 NOTE — ED Notes (Signed)
Printed two old ekgs from [facility]. 

## 2011-05-29 NOTE — ED Notes (Signed)
Pt c/o left chest pain off and on for few weeks. Pt was seen here for same on 2/4 st's pain has continued since then.

## 2011-05-30 ENCOUNTER — Other Ambulatory Visit: Payer: Self-pay

## 2011-05-30 ENCOUNTER — Encounter (HOSPITAL_COMMUNITY): Payer: Self-pay | Admitting: *Deleted

## 2011-05-30 DIAGNOSIS — R0789 Other chest pain: Secondary | ICD-10-CM

## 2011-05-30 LAB — CBC
HCT: 37.5 % — ABNORMAL LOW (ref 39.0–52.0)
Hemoglobin: 12.2 g/dL — ABNORMAL LOW (ref 13.0–17.0)
MCH: 22.9 pg — ABNORMAL LOW (ref 26.0–34.0)
MCHC: 32.5 g/dL (ref 30.0–36.0)
MCV: 70.5 fL — ABNORMAL LOW (ref 78.0–100.0)
Platelets: 146 10*3/uL — ABNORMAL LOW (ref 150–400)
RBC: 5.32 MIL/uL (ref 4.22–5.81)
RDW: 16.2 % — ABNORMAL HIGH (ref 11.5–15.5)
WBC: 6.1 10*3/uL (ref 4.0–10.5)

## 2011-05-30 LAB — CARDIAC PANEL(CRET KIN+CKTOT+MB+TROPI)
CK, MB: 4.3 ng/mL — ABNORMAL HIGH (ref 0.3–4.0)
CK, MB: 3.8 ng/mL (ref 0.3–4.0)
Relative Index: 1.2 (ref 0.0–2.5)
Relative Index: 1.1 (ref 0.0–2.5)
Total CK: 368 U/L — ABNORMAL HIGH (ref 7–232)
Total CK: 331 U/L — ABNORMAL HIGH (ref 7–232)
Troponin I: <0.3 ng/mL (ref <0.30)
Troponin I: <0.3 ng/mL (ref <0.30)

## 2011-05-30 LAB — TROPONIN I
Troponin I: <0.3 ng/mL (ref <0.30)
Troponin I: <0.3 ng/mL (ref <0.30)

## 2011-05-30 LAB — CREATININE, SERUM
Creatinine, Ser: 1.11 mg/dL (ref 0.50–1.35)
GFR calc Af Amer: 75 mL/min — ABNORMAL LOW (ref >90)
GFR calc non Af Amer: 65 mL/min — ABNORMAL LOW (ref >90)

## 2011-05-30 MED ORDER — PANTOPRAZOLE SODIUM 40 MG PO TBEC
40.0000 mg | DELAYED_RELEASE_TABLET | Freq: Every day | ORAL | Status: DC
  Administered 2011-05-30 – 2011-06-01 (×3): 40 mg via ORAL
  Filled 2011-05-30 (×2): qty 1

## 2011-05-30 MED ORDER — FLUTICASONE PROPIONATE 50 MCG/ACT NA SUSP
1.0000 | Freq: Every day | NASAL | Status: DC
  Administered 2011-05-30 – 2011-06-02 (×3): 1 via NASAL
  Filled 2011-05-30: qty 16

## 2011-05-30 MED ORDER — SODIUM CHLORIDE 0.9 % IJ SOLN
3.0000 mL | Freq: Two times a day (BID) | INTRAMUSCULAR | Status: DC
  Administered 2011-05-30 – 2011-06-02 (×5): 3 mL via INTRAVENOUS

## 2011-05-30 MED ORDER — LOSARTAN POTASSIUM 50 MG PO TABS
100.0000 mg | ORAL_TABLET | Freq: Every day | ORAL | Status: DC
  Administered 2011-05-30 – 2011-06-02 (×4): 100 mg via ORAL
  Filled 2011-05-30 (×4): qty 2

## 2011-05-30 MED ORDER — ASPIRIN EC 325 MG PO TBEC
325.0000 mg | DELAYED_RELEASE_TABLET | Freq: Every day | ORAL | Status: DC
  Administered 2011-05-30 – 2011-06-02 (×3): 325 mg via ORAL
  Filled 2011-05-30 (×3): qty 1

## 2011-05-30 MED ORDER — FERROUS SULFATE 325 (65 FE) MG PO TABS
325.0000 mg | ORAL_TABLET | Freq: Every day | ORAL | Status: DC
  Administered 2011-05-31 – 2011-06-02 (×3): 325 mg via ORAL
  Filled 2011-05-30 (×4): qty 1

## 2011-05-30 MED ORDER — ONDANSETRON HCL 4 MG PO TABS: 4.0000 mg | ORAL_TABLET | Freq: Four times a day (QID) | ORAL | Status: DC | PRN

## 2011-05-30 MED ORDER — TRAMADOL HCL 50 MG PO TABS
50.0000 mg | ORAL_TABLET | Freq: Four times a day (QID) | ORAL | Status: DC | PRN
  Filled 2011-05-30: qty 1

## 2011-05-30 MED ORDER — SODIUM CHLORIDE 0.9 % IV SOLN: INTRAVENOUS | Status: DC

## 2011-05-30 MED ORDER — ALBUTEROL SULFATE HFA 108 (90 BASE) MCG/ACT IN AERS
1.0000 | INHALATION_SPRAY | Freq: Four times a day (QID) | RESPIRATORY_TRACT | Status: DC | PRN
  Filled 2011-05-30: qty 6.7

## 2011-05-30 MED ORDER — GERITOL TONIC PO LIQD: 30.0000 mL | Freq: Every day | ORAL | Status: DC

## 2011-05-30 MED ORDER — GUAIFENESIN ER 600 MG PO TB12
600.0000 mg | ORAL_TABLET | Freq: Two times a day (BID) | ORAL | Status: DC | PRN
  Administered 2011-05-30 – 2011-06-01 (×4): 600 mg via ORAL
  Filled 2011-05-30 (×3): qty 1

## 2011-05-30 MED ORDER — ACETAMINOPHEN 325 MG PO TABS
650.0000 mg | ORAL_TABLET | Freq: Four times a day (QID) | ORAL | Status: DC | PRN
  Administered 2011-05-30: 650 mg via ORAL
  Filled 2011-05-30: qty 2

## 2011-05-30 MED ORDER — OXYCODONE HCL 5 MG PO TABS
5.0000 mg | ORAL_TABLET | ORAL | Status: DC | PRN
  Administered 2011-06-01: 5 mg via ORAL

## 2011-05-30 MED ORDER — HYDROCHLOROTHIAZIDE 25 MG PO TABS
25.0000 mg | ORAL_TABLET | Freq: Every day | ORAL | Status: DC
  Administered 2011-05-30 – 2011-06-02 (×4): 25 mg via ORAL
  Filled 2011-05-30 (×4): qty 1

## 2011-05-30 MED ORDER — LOSARTAN POTASSIUM-HCTZ 100-25 MG PO TABS: 1.0000 | ORAL_TABLET | Freq: Every day | ORAL | Status: DC

## 2011-05-30 MED ORDER — SENNOSIDES-DOCUSATE SODIUM 8.6-50 MG PO TABS
1.0000 | ORAL_TABLET | Freq: Every evening | ORAL | Status: DC | PRN
  Filled 2011-05-30: qty 1

## 2011-05-30 MED ORDER — ONDANSETRON HCL 4 MG/2ML IJ SOLN: 4.0000 mg | Freq: Four times a day (QID) | INTRAMUSCULAR | Status: DC | PRN

## 2011-05-30 MED ORDER — NICOTINE 21 MG/24HR TD PT24
21.0000 mg | MEDICATED_PATCH | Freq: Every day | TRANSDERMAL | Status: DC
  Administered 2011-05-30 – 2011-06-02 (×4): 21 mg via TRANSDERMAL
  Filled 2011-05-30 (×4): qty 1

## 2011-05-30 MED ORDER — ATENOLOL 50 MG PO TABS
50.0000 mg | ORAL_TABLET | Freq: Every day | ORAL | Status: DC
  Administered 2011-05-30 – 2011-06-02 (×4): 50 mg via ORAL
  Filled 2011-05-30 (×4): qty 1

## 2011-05-30 MED ORDER — ENOXAPARIN SODIUM 40 MG/0.4ML ~~LOC~~ SOLN
40.0000 mg | Freq: Every day | SUBCUTANEOUS | Status: DC
  Administered 2011-05-30 – 2011-05-31 (×2): 40 mg via SUBCUTANEOUS
  Filled 2011-05-30 (×3): qty 0.4

## 2011-05-30 MED ORDER — SODIUM CHLORIDE 0.9 % IV SOLN
INTRAVENOUS | Status: DC
  Administered 2011-05-30 – 2011-05-31 (×3): via INTRAVENOUS

## 2011-05-30 MED ORDER — ACETAMINOPHEN 650 MG RE SUPP: 650.0000 mg | Freq: Four times a day (QID) | RECTAL | Status: DC | PRN

## 2011-05-30 MED ORDER — SIMVASTATIN 20 MG PO TABS
20.0000 mg | ORAL_TABLET | Freq: Every day | ORAL | Status: DC
  Administered 2011-05-30 – 2011-06-01 (×3): 20 mg via ORAL
  Filled 2011-05-30 (×4): qty 1

## 2011-05-30 NOTE — ED Notes (Signed)
bmi 34.5

## 2011-05-30 NOTE — ED Provider Notes (Addendum)
9:38 AM We have been contacted by the radiologist who is refusing to perform the CT coronary angiogram to evaluate the patient's chest pain due to him being too high risk for the study, citing his heart rate drifting into the bradycardic range as low as 37 beats per minute. At this time, reviewing the patient's presentation, and previous presentations to the ED for chest pain, as well as prior consultation by [facility] cardiology recommending inpatient admission for chest pain rule out and likely stress testing and/or cardiac catheterization (at which time the patient left the hospital AGAINST MEDICAL ADVICE), I am contacting cardiology to come and evaluate the patient for admission for further testing. I conveyed this to the patient and he states his understanding of and agreement with the plan of care. He is currently in no acute distress, reports no chest pain, has a heart rate of 70 beats per minute and regular, and has clear lung sounds in all fields and normal heart sounds and a regular rate and rhythm.  Felisa Bonier, MD 05/30/11 [phone]  9:55 AM I discussed the patient's case by telephone with Dr. Shirlee Latch of [facility] who advises a medicine admission with a stress myoview in the AM and cardiology consultation prn.  Felisa Bonier, MD 05/30/11 [phone]

## 2011-05-30 NOTE — ED Notes (Signed)
I gave the patient a warm blanket. 

## 2011-05-30 NOTE — H&P (Signed)
PCP:   Katy Apo, MD, MD   Chief Complaint:  Chest Pain    HPI: 71 y/o black man with third ED visit in 3 days for CP. He had left AMA the 2 prior times. Has a h/o HTN, hyperlipidemia, tobacco abuse and PVD, has left sided CP at rest (per him relieved with exertion!), no radiation. Had a negative stress test in 2009 performed by Dr. Myrtis Ser with [facility]. ED called cardiology who has recommended a medical admission for r/o and nuclear stress test in the am.  Allergies:   Allergies  Allergen Reactions  . Lisinopril     REACTION: Cough      Past Medical History  Diagnosis Date  . GERD (gastroesophageal reflux disease)   . HLD (hyperlipidemia)   . HTN (hypertension)   . Tobacco abuse   . Cough secondary to angiotensin converting enzyme inhibitor (ACE-I)   . Rotator cuff injury   . Angina   . Peripheral vascular disease     Past Surgical History  Procedure Date  . Iliac artery aneurysm repair   . Circumcision   . Pr vein bypass graft,aorto-fem-pop 10/01/2009    left below knee popliteal artery to posterior tibial artery    Prior to Admission medications   Medication Sig Start Date End Date Taking? Authorizing Provider  albuterol (PROVENTIL HFA;VENTOLIN HFA) 108 (90 BASE) MCG/ACT inhaler Inhale 1-2 puffs into the lungs every 6 (six) hours as needed. For shortness of breath 03/10/11 03/09/12 Yes Felisa Bonier, MD  aspirin EC 325 MG tablet Take 325 mg by mouth daily.   Yes Historical Provider, MD  atenolol (TENORMIN) 50 MG tablet Take by mouth daily.    Yes Historical Provider, MD  ferrous sulfate 325 (65 FE) MG tablet Take 325 mg by mouth daily with breakfast.   Yes Historical Provider, MD  gabapentin (NEURONTIN) 300 MG capsule Take 300 mg by mouth daily as needed. For headaches   Yes Historical Provider, MD  Iron-Vitamins (GERITOL) LIQD Take 30 mLs by mouth daily.   Yes Historical Provider, MD  losartan-hydrochlorothiazide (HYZAAR) 100-25 MG per tablet Take 1 tablet  by mouth daily.     Yes Historical Provider, MD  omeprazole (PRILOSEC) 20 MG capsule Take 20 mg by mouth 2 (two) times daily.     Yes Historical Provider, MD  simvastatin (ZOCOR) 20 MG tablet Take 20 mg by mouth at bedtime.     Yes Historical Provider, MD  traMADol (ULTRAM) 50 MG tablet Take 50 mg by mouth every 6 (six) hours as needed. For pain. Maximum dose= 8 tablets per day   Yes Historical Provider, MD    Social History:  reports that he has been smoking Cigarettes.  He has a 55 pack-year smoking history. He uses smokeless tobacco. He reports that he does not drink alcohol or use illicit drugs.  Family History  Problem Relation Age of Onset  . Breast cancer Mother   . COPD Father     Review of Systems:  Negative except as mentioned in H and P.  Physical Exam: Blood pressure 136/73, pulse 54, temperature 98.1 F (36.7 C), temperature source Oral, resp. rate 18, height 5\' 7"  (1.702 m), weight 99.791 kg (220 lb), SpO2 100.00%. Gen: alert, awake, oriented x 3. HEENT: normocephalic, atraumatic, PERRL, EOMI, moist mucous membranes. Neck: supple, no JVD, no lymphadenopathy, no bruits, no goiter. CV: regular rate and rhythm, no murmurs, rubs or gallops. Lungs: Clear to auscultation bilaterally. Abdomen: soft, nontender, nondistended, positive bowel  sounds, no masses or organomegaly noted. Ext: no clubbing, cyanosis or edema with positive pedal pulses. Neuro: grossly intact and nonfocal.  Labs on Admission:  Results for orders placed during the hospital encounter of 05/29/11 (from the past 48 hour(s))  CBC     Status: Abnormal   Collection Time   05/29/11  8:44 PM      Component Value Range Comment   WBC 5.4  4.0 - 10.5 (K/uL)    RBC 5.16  4.22 - 5.81 (MIL/uL)    Hemoglobin 11.9 (*) 13.0 - 17.0 (g/dL)    HCT 16.1 (*) 09.6 - 52.0 (%)    MCV 69.8 (*) 78.0 - 100.0 (fL)    MCH 23.1 (*) 26.0 - 34.0 (pg)    MCHC 33.1  30.0 - 36.0 (g/dL)    RDW 04.5 (*) 40.9 - 15.5 (%)    Platelets  157  150 - 400 (K/uL)   BASIC METABOLIC PANEL     Status: Abnormal   Collection Time   05/29/11  8:44 PM      Component Value Range Comment   Sodium 141  135 - 145 (mEq/L)    Potassium 3.5  3.5 - 5.1 (mEq/L)    Chloride 104  96 - 112 (mEq/L)    CO2 26  19 - 32 (mEq/L)    Glucose, Bld 99  70 - 99 (mg/dL)    BUN 12  6 - 23 (mg/dL)    Creatinine, Ser 8.11  0.50 - 1.35 (mg/dL)    Calcium 9.5  8.4 - 10.5 (mg/dL)    GFR calc non Af Amer 57 (*) >90 (mL/min)    GFR calc Af Amer 66 (*) >90 (mL/min)   TROPONIN I     Status: Normal   Collection Time   05/29/11  9:19 PM      Component Value Range Comment   Troponin I <0.30  <0.30 (ng/mL)   POCT I-STAT TROPONIN I     Status: Normal   Collection Time   05/29/11  9:50 PM      Component Value Range Comment   Troponin i, poc 0.01  0.00 - 0.08 (ng/mL)    Comment 3            TROPONIN I     Status: Normal   Collection Time   05/29/11 11:53 PM      Component Value Range Comment   Troponin I <0.30  <0.30 (ng/mL)   TROPONIN I     Status: Normal   Collection Time   05/30/11  3:11 AM      Component Value Range Comment   Troponin I <0.30  <0.30 (ng/mL)     Radiological Exams on Admission: Dg Chest 2 View  05/29/2011  *RADIOLOGY REPORT*  Clinical Data: Left-sided chest pain and shortness of breath.  CHEST - 2 VIEW  Comparison: 05/17/2011  Findings: Two views of the chest demonstrate clear lungs. Heart and mediastinum are within normal limits.  Degenerative endplate changes in the thoracic spine.  No evidence for edema or pleural effusions.  IMPRESSION: No acute chest findings.  Original Report Authenticated By: Richarda Overlie, M.D.    Assessment/Plan Principal Problem:  *Chest pain, atypical Active Problems:  HYPERLIPIDEMIA  TOBACCO ABUSE  HYPERTENSION  PVD   #1 Atypical Chest Pain: So far, EKG and enzymes are non-acute. Has already been scheduled for a stress test in am by Dr. Shirlee Latch with [facility]ology. Seems more to be chest wall tenderness  as it  is most definitely reproducible by palpation. Low probability for PE by Wells Criteria. Will not order d-dimer or pursue PE any further at this point.  #2 Hyperlipidemia: Check FLP. Continue statin.  #3 HTN: well controlled. Continue home meds.  #4 Tobacco abuse: nicotine patch. Cessation counseling given. Patient not interested in quitting at this time.  #5 DVT Prophylaxis: Lovenox.  #6 Dispo: Likely Dc home after stress test, if normal.  Time Spent on Admission: 50 minutes.  Chaya Jan [facility] Pager: [phone] 05/30/2011, 2:02 PM

## 2011-05-30 NOTE — ED Notes (Signed)
Received call from [facility] in ct scan and radiologist feels pt is too high risk for cardiace ct scan and recommends that cardiology be called for consult.

## 2011-05-30 NOTE — Progress Notes (Signed)
Pt total CK 368 CKMB4.3 Trop < .30 - pt c/o headache and sinus/nasal congestion - MD notified with new orders.

## 2011-05-30 NOTE — ED Notes (Signed)
Pt has assigned bed on 3700 but has no admission orders yet. Talked with Dr Fredricka Bonine about writing some temporary orders.

## 2011-05-31 ENCOUNTER — Encounter (HOSPITAL_COMMUNITY): Payer: Self-pay | Admitting: Nurse Practitioner

## 2011-05-31 ENCOUNTER — Other Ambulatory Visit: Payer: Self-pay

## 2011-05-31 DIAGNOSIS — R079 Chest pain, unspecified: Secondary | ICD-10-CM

## 2011-05-31 DIAGNOSIS — R072 Precordial pain: Secondary | ICD-10-CM

## 2011-05-31 LAB — LIPID PANEL
Cholesterol: 144 mg/dL (ref 0–200)
HDL: 35 mg/dL — ABNORMAL LOW (ref >39)
LDL Cholesterol: 88 mg/dL (ref 0–99)
Total CHOL/HDL Ratio: 4.1 RATIO
Triglycerides: 103 mg/dL (ref <150)
VLDL: 21 mg/dL (ref 0–40)

## 2011-05-31 LAB — CARDIAC PANEL(CRET KIN+CKTOT+MB+TROPI)
CK, MB: 3.5 ng/mL (ref 0.3–4.0)
Relative Index: 1.2 (ref 0.0–2.5)
Total CK: 291 U/L — ABNORMAL HIGH (ref 7–232)
Troponin I: <0.3 ng/mL (ref <0.30)

## 2011-05-31 LAB — CBC
HCT: 36.7 % — ABNORMAL LOW (ref 39.0–52.0)
Hemoglobin: 12.1 g/dL — ABNORMAL LOW (ref 13.0–17.0)
MCH: 23.1 pg — ABNORMAL LOW (ref 26.0–34.0)
MCHC: 33 g/dL (ref 30.0–36.0)
MCV: 70 fL — ABNORMAL LOW (ref 78.0–100.0)
Platelets: 142 10*3/uL — ABNORMAL LOW (ref 150–400)
RBC: 5.24 MIL/uL (ref 4.22–5.81)
RDW: 16.2 % — ABNORMAL HIGH (ref 11.5–15.5)
WBC: 5.4 10*3/uL (ref 4.0–10.5)

## 2011-05-31 LAB — BASIC METABOLIC PANEL
BUN: 11 mg/dL (ref 6–23)
CO2: 28 mEq/L (ref 19–32)
Calcium: 9.6 mg/dL (ref 8.4–10.5)
Chloride: 105 mEq/L (ref 96–112)
Creatinine, Ser: 1.2 mg/dL (ref 0.50–1.35)
GFR calc Af Amer: 68 mL/min — ABNORMAL LOW (ref >90)
GFR calc non Af Amer: 59 mL/min — ABNORMAL LOW (ref >90)
Glucose, Bld: 100 mg/dL — ABNORMAL HIGH (ref 70–99)
Potassium: 3.9 mEq/L (ref 3.5–5.1)
Sodium: 141 mEq/L (ref 135–145)

## 2011-05-31 MED ORDER — SODIUM CHLORIDE 0.9 % IV SOLN
1.0000 mL/kg/h | INTRAVENOUS | Status: DC
  Administered 2011-06-01: 1 mL/kg/h via INTRAVENOUS

## 2011-05-31 NOTE — Progress Notes (Signed)
Subjective: Patient without any complaint of chest pain since hospitalized however prior to hospitalization had chest pain at rest. 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 He's had a stress test in the office and negative January 2012. Review of note shows she's come to the ER 3 times with complaint of chest pain. Previous imaging including CAT scan of chest has been been unremarkable for pathology. He is some concern for reflux or esophageal spasm. EGD 2011 has been unremarkable.   Objective: Vital signs in last 24 hours: Temp:  [98.2 F (36.8 C)-98.7 F (37.1 C)] 98.2 F (36.8 C) (02/18 0600) Pulse Rate:  [54-58] 55  (02/18 1037) Resp:  [18] 18  (02/18 0600) BP: (129-141)/(70-79) 129/79 mmHg (02/18 1037) SpO2:  [95 %-100 %] 100 % (02/18 0600) Weight change:  Last BM Date: 05/30/11  Intake/Output from previous day: 02/17 0701 - 02/18 0700 In: 900 [I.V.:900] Out: -  Intake/Output this shift:    General appearance: alert and cooperative Resp: clear to auscultation bilaterally Cardio: regular rate and rhythm, S1, S2 normal, no murmur, click, rub or gallop Extremities: extremities normal, atraumatic, no cyanosis or edema  Lab Results:  Basename 05/31/11 0254 05/30/11 1449  WBC 5.4 6.1  HGB 12.1* 12.2*  HCT 36.7* 37.5*  PLT 142* 146*   BMET  Basename 05/31/11 0254 05/30/11 1449 05/29/11 2044  NA 141 -- 141  K 3.9 -- 3.5  CL 105 -- 104  CO2 28 -- 26  GLUCOSE 100* -- 99  BUN 11 -- 12  CREATININE 1.20 1.11 --  CALCIUM 9.6 -- 9.5   Specimen Type: Blood Total CK 291 U/L H CK, MB 3.5 ng/mL Troponin I <0.30 ng/mL Relative Index 1.2  Lipid panel [[id number]] Abnormal Collection: 05/31/11 0254 Resulted: 05/31/11 0353 Cholesterol 144 mg/dL Triglycerides 540 mg/dL HDL 35 mg/dL L Total CHOL/HDL Ratio 4.1 RATIO VLDL 21 mg/dL LDL Cholesterol 88 mg/dL   Cardiac panel(cret kin+cktot+mb+tropi) [[id number]] Abnormal  Collection: 05/30/11 1922 Resulted: 05/30/11 2136 Specimen Type: Blood Total CK 331 U/L H CK, MB 3.8 ng/mL Troponin I <0.30 ng/mL Relative Index 1.1 Cardiac panel(cret kin+cktot+mb+tropi) [[id number]] Abnormal Collection: 05/30/11 1450 Resulted: 05/30/11 1603 Specimen Type: Blood Total CK 368 U/L H CK, MB 4.3 ng/mL H Troponin I <0.30 ng/mL Relative Index 1.2     Studies/Results: Dg Chest 2 View  05/29/2011  *RADIOLOGY REPORT*  Clinical Data: Left-sided chest pain and shortness of breath.  CHEST - 2 VIEW  Comparison: 05/17/2011  Findings: Two views of the chest demonstrate clear lungs. Heart and mediastinum are within normal limits.  Degenerative endplate changes in the thoracic spine.  No evidence for edema or pleural effusions.  IMPRESSION: No acute chest findings.  Original Report Authenticated By: Richarda Overlie, M.D.    Medications:  Prior to Admission:  Prescriptions prior to admission  Medication Sig Dispense Refill  . albuterol (PROVENTIL HFA;VENTOLIN HFA) 108 (90 BASE) MCG/ACT inhaler Inhale 1-2 puffs into the lungs every 6 (six) hours as needed. For shortness of breath      . aspirin EC 325 MG tablet Take 325 mg by mouth daily.      Marland Kitchen atenolol (TENORMIN) 50 MG tablet Take by mouth daily.       . ferrous sulfate 325 (65 FE) MG tablet Take 325 mg by mouth daily with breakfast.      . gabapentin (NEURONTIN) 300 MG capsule Take 300 mg by mouth daily as needed. For headaches      .  Iron-Vitamins (GERITOL) LIQD Take 30 mLs by mouth daily.      Marland Kitchen losartan-hydrochlorothiazide (HYZAAR) 100-25 MG per tablet Take 1 tablet by mouth daily.        Marland Kitchen omeprazole (PRILOSEC) 20 MG capsule Take 20 mg by mouth 2 (two) times daily.        . simvastatin (ZOCOR) 20 MG tablet Take 20 mg by mouth at bedtime.        . traMADol (ULTRAM) 50 MG tablet Take 50 mg by mouth every 6 (six) hours as needed. For pain. Maximum dose= 8 tablets per day       Scheduled:   . aspirin EC  325 mg Oral Daily  . atenolol  50 mg Oral  Daily  . enoxaparin  40 mg Subcutaneous q1800  . ferrous sulfate  325 mg Oral Q breakfast  . fluticasone  1 spray Each Nare Daily  . hydrochlorothiazide  25 mg Oral Daily  . losartan  100 mg Oral Daily  . nicotine  21 mg Transdermal Daily  . pantoprazole  40 mg Oral Q1200  . simvastatin  20 mg Oral QHS  . sodium chloride  3 mL Intravenous Q12H  . DISCONTD: sodium chloride   Intravenous STAT  . DISCONTD: Geritol  30 mL Oral Daily  . DISCONTD: losartan-hydrochlorothiazide  1 tablet Oral Daily   Continuous:   . sodium chloride 75 mL/hr at 05/31/11 4098    Assessment/Plan: Principal Problem:  *Chest pain, atypical Active Problems:  HYPERLIPIDEMIA  TOBACCO ABUSE  HYPERTENSION  PVD   At this time will await stress test, I will try to discuss with cardiology if any additional evaluation is indicated. At this time his chest pain is felt to be atypical however he does have risk factors for coronary disease. To date there has been no objective evidence of ischemia i.e. Normal EKG, normal cardiac enzymes. Normal stress test in the past   LOS: 2 days   Merinda Victorino D 05/31/2011, 12:46 PM

## 2011-05-31 NOTE — Progress Notes (Signed)
Pt declines second IV start for cath at present.  He states he wishes to have IV placed in the am.

## 2011-05-31 NOTE — Consult Note (Signed)
CARDIOLOGY CONSULT NOTE  [name] MRN: [id number], DOB/AGE: 72/10/1940   Admit date: 05/29/2011 Date of Consult: 05/31/2011   Primary Physician: Katy Apo, MD, MD Primary Cardiologist: D. Bensimhon  Pt. Profile  71 y/o male w/o prior h/o CAD who presented with recurrent c/p, r/o, and had inadequate stress echo.  Problem List  Past Medical History  Diagnosis Date  . GERD (gastroesophageal reflux disease)   . HLD (hyperlipidemia)   . HTN (hypertension)   . Tobacco abuse   . Cough secondary to angiotensin converting enzyme inhibitor (ACE-I)   . Rotator cuff injury   . Angina   . Peripheral vascular disease   . Chest pain     A. Negative MV 04/2010;  B. Inadequate Stress Echo 05/2011    Past Surgical History  Procedure Date  . Iliac artery aneurysm repair   . Circumcision   . Pr vein bypass graft,aorto-fem-pop 10/01/2009    left below knee popliteal artery to posterior tibial artery     Allergies  Allergies  Allergen Reactions  . Lisinopril     REACTION: Cough    HPI   71 y/o male with the above problem list.  He has a long (at least 6-8 mos though he had a negative nuc January 2012) h/o daily, 2/10 chest pain, that occurs almost exclusively @ rest, lasting a few hours at a time, and resolving with tramadol.  Pain occurs over the left chest but sometimes over the left upper quadrant.  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  He presented to the ED on 2/16 2/2 chest pain and has since r/o.  He underwent a St Echo this am, which unfortunately was inadequate 2/2 his inability to achieve target HR b/c of leg and hip pain (? Claudication).    Inpatient Medications     . aspirin EC  325 mg Oral Daily  . atenolol  50 mg Oral Daily  . enoxaparin  40 mg Subcutaneous q1800  . ferrous sulfate  325 mg Oral Q breakfast  . fluticasone  1 spray Each Nare Daily  . hydrochlorothiazide  25 mg Oral Daily    . losartan  100 mg Oral Daily  . nicotine  21 mg Transdermal Daily  . pantoprazole  40 mg Oral Q1200  . simvastatin  20 mg Oral QHS  . sodium chloride  3 mL Intravenous Q12H    Family History Family History  Problem Relation Age of Onset  . Breast cancer Mother   . COPD Father      Social History History   Social History  . Marital Status: Widowed    Spouse Name: N/A    Number of Children: N/A  . Years of Education: N/A   Occupational History  . Not on file.   Social History Main Topics  . Smoking status: Current Everyday Smoker -- 1.0 packs/day for 55 years    Types: Cigarettes  . Smokeless tobacco: Current User  . Alcohol Use: No  . Drug Use: No  . Sexually Active:    Other Topics Concern  . Not on file   Social History Narrative   Single. Retired.      Review of Systems  General:  No chills, fever, night sweats or weight changes.  Cardiovascular:  ++ chest pain as outlined above.  No dyspnea on exertion, edema, orthopnea, palpitations, paroxysmal nocturnal dyspnea.  ? Claudication (vs OA) - says he gets knee  and hip pain if he walks too much. Dermatological: No rash, lesions/masses Respiratory: No cough, dyspnea Urologic: No hematuria, dysuria Abdominal:   No nausea, vomiting, diarrhea, bright red blood per rectum, melena, or hematemesis Neurologic:  No visual changes, wkns, changes in mental status. All other systems reviewed and are otherwise negative except as noted above.  Physical Exam  Blood pressure 149/68, pulse 52, temperature 97.7 F (36.5 C), temperature source Oral, resp. rate 18, height 5\' 7"  (1.702 m), weight 220 lb (99.791 kg), SpO2 99.00%.  General: Pleasant, NAD Psych: Normal affect. Neuro: Alert and oriented X 3. Moves all extremities spontaneously. HEENT: Normal  Neck: Supple without bruits or JVD. Lungs:  Resp regular and unlabored, CTA. Heart: RRR no s3, s4, or murmurs. Abdomen: Soft, non-tender, non-distended, BS + x 4.   Extremities: No clubbing, cyanosis or edema. DP/PT/Radials 2+ and equal bilaterally.  Labs   Basename 05/31/11 0254 05/30/11 1922 05/30/11 1450 05/30/11 0311  CKTOTAL 291* 331* 368* --  CKMB 3.5 3.8 4.3* --  TROPONINI <0.30 <0.30 <0.30 <0.30   Lab Results  Component Value Date   WBC 5.4 05/31/2011   HGB 12.1* 05/31/2011   HCT 36.7* 05/31/2011   MCV 70.0* 05/31/2011   PLT 142* 05/31/2011     Lab 05/31/11 0254  NA 141  K 3.9  CL 105  CO2 28  BUN 11  CREATININE 1.20  CALCIUM 9.6  PROT --  BILITOT --  ALKPHOS --  ALT --  AST --  GLUCOSE 100*   Lab Results  Component Value Date   CHOL 144 05/31/2011   HDL 35* 05/31/2011   LDLCALC 88 05/31/2011   TRIG 103 05/31/2011     Stress Echo - 05/31/2011 Impressions:  - Submaximal treadmill stress. Poor exercise tolerance with hypertensive BP response. No chest pain. There were 1 mm ST depression V4-V5 at peak stress recovered rapidly with rest. No definitive Tomaz Janis motion abnormalities noted with stress echo. Nondiagnostic test (submaximal).   Radiology/Studies  Dg Chest 2 View  05/29/2011  *RADIOLOGY REPORT*  Clinical Data: Left-sided chest pain and shortness of breath.  CHEST - 2 VIEW  Comparison: 05/17/2011  Findings: Two views of the chest demonstrate clear lungs. Heart and mediastinum are within normal limits.  Degenerative endplate changes in the thoracic spine.  No evidence for edema or pleural effusions.  IMPRESSION: No acute chest findings.  Original Report Authenticated By: Richarda Overlie, M.D.   ECG  Sinus Brady, 54, LVH  ASSESSMENT AND PLAN  1.  Chest Pain:  Fairly atypical though pt has multiple risk factors and ongoing Ss.  His stress echo this am was inadequate.  We have discussed performing diagnostic cath and pt is willing to proceed.  He is fairly fearful of groin related complications, as he apparently had some following peripheral angiography in the past.  We will plan to try the right radial.  Cont asa, bb,  statin.   2.  Tob Abuse:  Cessation advised.  Pt recognizes that he needs to quit.  3.  HTN:  Cont current meds.  Trend and titrate as needed.  4.  HL:  Cont statin.  LDL = 88.    Signed, Nicolasa Ducking, NP 05/31/2011, 4:09 PM   I have taken a history, reviewed medications, allergies, PMH, SH, FH, and reviewed ROS and examined the patient.  I agree with the assessment and plan. Patient understands indication, potential risk and benefit, and agrees to proceed. Will try to do radial approach.  Maisie Fus  Argie Ramming, MD, [facility] Pager:  [phone]

## 2011-05-31 NOTE — Progress Notes (Signed)
*  PRELIMINARY RESULTS* Echocardiogram 2D Echocardiogram has been performed.  Jeryl Columbia R 05/31/2011, 10:02 AM

## 2011-05-31 NOTE — Progress Notes (Signed)
Pt with chest pain who smokes 1 ppd. He is in action stage and ready to quit. Pt has a patch on now and wants instructions on patches to use after d/c. Discussed and wrote down patch use instructions for the pt. Recommended 21 mg patch x 6 weeks, 14 mg patch x 2 weeks and the 7 mg patch x 2 weeks. Marland Kitchen Referred to 1-800 quit now for f/u and support. Discussed oral fixation substitutes, second hand smoke and in home smoking policy. Reviewed and gave pt Written education/contact information.

## 2011-06-01 ENCOUNTER — Encounter (HOSPITAL_COMMUNITY): Payer: Self-pay | Admitting: Cardiology

## 2011-06-01 ENCOUNTER — Encounter: Payer: Self-pay | Admitting: Vascular Surgery

## 2011-06-01 ENCOUNTER — Encounter (HOSPITAL_COMMUNITY)
Admission: EM | Disposition: A | Discharge status: Home / Self Care | Payer: Self-pay | Source: Home / Self Care | Attending: Internal Medicine

## 2011-06-01 DIAGNOSIS — I2510 Atherosclerotic heart disease of native coronary artery without angina pectoris: Secondary | ICD-10-CM

## 2011-06-01 HISTORY — PX: LEFT HEART CATHETERIZATION WITH CORONARY ANGIOGRAM: SHX5451

## 2011-06-01 LAB — FERRITIN: Ferritin: 240 ng/mL (ref 22–322)

## 2011-06-01 LAB — IRON AND TIBC
Iron: 56 ug/dL (ref 42–135)
Saturation Ratios: 21 % (ref 20–55)
TIBC: 263 ug/dL (ref 215–435)
UIBC: 207 ug/dL (ref 125–400)

## 2011-06-01 LAB — RETICULOCYTES
RBC.: 5.09 MIL/uL (ref 4.22–5.81)
Retic Count, Absolute: 56 10*3/uL (ref 19.0–186.0)
Retic Ct Pct: 1.1 % (ref 0.4–3.1)

## 2011-06-01 SURGERY — LEFT HEART CATHETERIZATION WITH CORONARY ANGIOGRAM
Anesthesia: LOCAL

## 2011-06-01 MED ORDER — HEPARIN SODIUM (PORCINE) 1000 UNIT/ML IJ SOLN
INTRAMUSCULAR | Status: AC
  Filled 2011-06-01: qty 1

## 2011-06-01 MED ORDER — VERAPAMIL HCL 2.5 MG/ML IV SOLN
INTRAVENOUS | Status: AC
  Filled 2011-06-01: qty 2

## 2011-06-01 MED ORDER — ACETAMINOPHEN 325 MG PO TABS
650.0000 mg | ORAL_TABLET | ORAL | Status: DC | PRN
  Administered 2011-06-02: 650 mg via ORAL
  Filled 2011-06-01: qty 2

## 2011-06-01 MED ORDER — ONDANSETRON HCL 4 MG/2ML IJ SOLN: 4.0000 mg | Freq: Four times a day (QID) | INTRAMUSCULAR | Status: DC | PRN

## 2011-06-01 MED ORDER — NITROGLYCERIN 0.2 MG/ML ON CALL CATH LAB
INTRAVENOUS | Status: AC
  Filled 2011-06-01: qty 1

## 2011-06-01 MED ORDER — OXYCODONE HCL 5 MG PO TABS
ORAL_TABLET | ORAL | Status: AC
  Filled 2011-06-01: qty 1

## 2011-06-01 MED ORDER — HEPARIN (PORCINE) IN NACL 2-0.9 UNIT/ML-% IJ SOLN
INTRAMUSCULAR | Status: AC
  Filled 2011-06-01: qty 2000

## 2011-06-01 MED ORDER — MIDAZOLAM HCL 2 MG/2ML IJ SOLN
INTRAMUSCULAR | Status: AC
  Filled 2011-06-01: qty 2

## 2011-06-01 MED ORDER — SODIUM CHLORIDE 0.9 % IV SOLN: INTRAVENOUS | Status: AC

## 2011-06-01 MED ORDER — LIDOCAINE HCL (PF) 1 % IJ SOLN
INTRAMUSCULAR | Status: AC
  Filled 2011-06-01: qty 30

## 2011-06-01 MED ORDER — FENTANYL CITRATE 0.05 MG/ML IJ SOLN
INTRAMUSCULAR | Status: AC
  Filled 2011-06-01: qty 2

## 2011-06-01 NOTE — Progress Notes (Signed)
Subjective: Patient is pleasant, alert and oriented x3, he was seen in postop cath lab. His heart catheterization results for review with cardiologist, discussion took place in reference to stent in the need to require prolonged at the platelet medication. Patient has a history of iron deficient anemia dating back to 2011, when he required transfusions, this is in the perioperative phase he was treated for iliac aneurysms. He said EGD that was unremarkable, colonoscopy that was unremarkable. He never had any heme-positive stool or any bright red blood per rectum. Was taken iron his hemoglobin has remained stable above 12. I did discuss with patient cardiologist concern, at this time patient states he does not think he wants a stent being placed. He is somewhat a little concerned about this surgery he had to his iliac aneurysm.  Objective: Vital signs in last 24 hours: Temp:  [97.7 F (36.5 C)-98 F (36.7 C)] 97.7 F (36.5 C) (02/19 1325) Pulse Rate:  [57-61] 59  (02/19 1325) Resp:  [20-22] 20  (02/19 0500) BP: (129-178)/(74-82) 131/74 mmHg (02/19 1325) SpO2:  [94 %-97 %] 94 % (02/19 0500) Weight change:  Last BM Date: 05/31/11  Intake/Output from previous day: 02/18 0701 - 02/19 0700 In: 2473 [P.O.:720; I.V.:1753] Out: -  Intake/Output this shift:    General appearance: alert and cooperative Resp: clear to auscultation bilaterally Cardio: regular rate and rhythm, S1, S2 normal, no murmur, click, rub or gallop Extremities: extremities normal, atraumatic, no cyanosis or edema  Lab Results:  Basename 05/31/11 0254 05/30/11 1449  WBC 5.4 6.1  HGB 12.1* 12.2*  HCT 36.7* 37.5*  PLT 142* 146*   BMET  Basename 05/31/11 0254 05/30/11 1449 05/29/11 2044  NA 141 -- 141  K 3.9 -- 3.5  CL 105 -- 104  CO2 28 -- 26  GLUCOSE 100* -- 99  BUN 11 -- 12  CREATININE 1.20 1.11 --  CALCIUM 9.6 -- 9.5    Studies/Results: No results found.  Medications:  Prior to Admission:    Prescriptions prior to admission  Medication Sig Dispense Refill  . albuterol (PROVENTIL HFA;VENTOLIN HFA) 108 (90 BASE) MCG/ACT inhaler Inhale 1-2 puffs into the lungs every 6 (six) hours as needed. For shortness of breath      . aspirin EC 325 MG tablet Take 325 mg by mouth daily.      Marland Kitchen atenolol (TENORMIN) 50 MG tablet Take by mouth daily.       . ferrous sulfate 325 (65 FE) MG tablet Take 325 mg by mouth daily with breakfast.      . gabapentin (NEURONTIN) 300 MG capsule Take 300 mg by mouth daily as needed. For headaches      . Iron-Vitamins (GERITOL) LIQD Take 30 mLs by mouth daily.      Marland Kitchen losartan-hydrochlorothiazide (HYZAAR) 100-25 MG per tablet Take 1 tablet by mouth daily.        Marland Kitchen omeprazole (PRILOSEC) 20 MG capsule Take 20 mg by mouth 2 (two) times daily.        . simvastatin (ZOCOR) 20 MG tablet Take 20 mg by mouth at bedtime.        . traMADol (ULTRAM) 50 MG tablet Take 50 mg by mouth every 6 (six) hours as needed. For pain. Maximum dose= 8 tablets per day       Scheduled:   . aspirin EC  325 mg Oral Daily  . atenolol  50 mg Oral Daily  . fentaNYL      . ferrous sulfate  325 mg Oral Q breakfast  . fluticasone  1 spray Each Nare Daily  . heparin      . heparin      . hydrochlorothiazide  25 mg Oral Daily  . lidocaine      . losartan  100 mg Oral Daily  . midazolam      . nicotine  21 mg Transdermal Daily  . nitroGLYCERIN      . pantoprazole  40 mg Oral Q1200  . simvastatin  20 mg Oral QHS  . sodium chloride  3 mL Intravenous Q12H  . verapamil      . DISCONTD: enoxaparin  40 mg Subcutaneous q1800   Continuous:   . sodium chloride    . DISCONTD: sodium chloride 75 mL/hr at 06/01/11 0612  . DISCONTD: sodium chloride 1 mL/kg/hr (06/01/11 0612)    Assessment/Plan: Atypical chest pain, heart catheterization results discussed with cardiology,. At this time patient unsure if he wants to proceed with any stents. I would defer  further management per cardiology, in  reference To his anemia, he's had EGD and colonoscopy in the past, his hemoglobin has been stable since 2011, no obvious bleeding And There has been no requirements for transfusion. Followup iron studies have been ordered, further recommendations after review of those labs. Hypertension Hypercholesterolemia Tobacco dependence cessation discussed in the past.  LOS: 3 days   Timon Geissinger D 06/01/2011, 5:49 PM

## 2011-06-01 NOTE — Progress Notes (Signed)
Patient seen and examined.  Cath site was oozing but TR band in place without hematoma.  Good O2 saturation.  Denies chest pain.  History reviewed in detail.  None of his symptoms are associated with dyspnea, or diaphoresis.  All are non exertional.  Issues of his anatomy reviewed in detail with patient.  At issue are the fact that his symptoms may or may not be from CAD.  In addition, he has a microcytic anemia so the risk of using DAPT is increase.  Dr. Nehemiah Settle and I have reviewed this--patient has had prior endo for similar, and is on iron.  PCI would not be prohibitive, but there might be an increased risk of bleed.  Perhaps most germane to the issue is that he needs to stop smoking as he has diffuse disease involving both the CFX system as well as the RCA.  This will be critical.  Would add some long acting nitrates.  Fe, TIBC, ferritin, and stool all pending as a baseline in case we decide to use DAPT.    [name] 06/01/2011 4:02 PM

## 2011-06-01 NOTE — H&P (View-Only) (Signed)
Subjective: Patient without any complaint of chest pain since hospitalized however prior to hospitalization had chest pain at rest. 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 He's had a stress test in the office and negative January 2012. Review of note shows she's come to the ER 3 times with complaint of chest pain. Previous imaging including CAT scan of chest has been been unremarkable for pathology. He is some concern for reflux or esophageal spasm. EGD 2011 has been unremarkable.   Objective: Vital signs in last 24 hours: Temp:  [98.2 F (36.8 C)-98.7 F (37.1 C)] 98.2 F (36.8 C) (02/18 0600) Pulse Rate:  [54-58] 55  (02/18 1037) Resp:  [18] 18  (02/18 0600) BP: (129-141)/(70-79) 129/79 mmHg (02/18 1037) SpO2:  [95 %-100 %] 100 % (02/18 0600) Weight change:  Last BM Date: 05/30/11  Intake/Output from previous day: 02/17 0701 - 02/18 0700 In: 900 [I.V.:900] Out: -  Intake/Output this shift:    General appearance: alert and cooperative Resp: clear to auscultation bilaterally Cardio: regular rate and rhythm, S1, S2 normal, no murmur, click, rub or gallop Extremities: extremities normal, atraumatic, no cyanosis or edema  Lab Results:  Basename 05/31/11 0254 05/30/11 1449  WBC 5.4 6.1  HGB 12.1* 12.2*  HCT 36.7* 37.5*  PLT 142* 146*   BMET  Basename 05/31/11 0254 05/30/11 1449 05/29/11 2044  NA 141 -- 141  K 3.9 -- 3.5  CL 105 -- 104  CO2 28 -- 26  GLUCOSE 100* -- 99  BUN 11 -- 12  CREATININE 1.20 1.11 --  CALCIUM 9.6 -- 9.5   Specimen Type: Blood Total CK 291 U/L H CK, MB 3.5 ng/mL Troponin I <0.30 ng/mL Relative Index 1.2  Lipid panel [[id number]] Abnormal Collection: 05/31/11 0254 Resulted: 05/31/11 0353 Cholesterol 144 mg/dL Triglycerides 103 mg/dL HDL 35 mg/dL L Total CHOL/HDL Ratio 4.1 RATIO VLDL 21 mg/dL LDL Cholesterol 88 mg/dL   Cardiac panel(cret kin+cktot+mb+tropi) [[id number]] Abnormal  Collection: 05/30/11 1922 Resulted: 05/30/11 2136 Specimen Type: Blood Total CK 331 U/L H CK, MB 3.8 ng/mL Troponin I <0.30 ng/mL Relative Index 1.1 Cardiac panel(cret kin+cktot+mb+tropi) [[id number]] Abnormal Collection: 05/30/11 1450 Resulted: 05/30/11 1603 Specimen Type: Blood Total CK 368 U/L H CK, MB 4.3 ng/mL H Troponin I <0.30 ng/mL Relative Index 1.2     Studies/Results: Dg Chest 2 View  05/29/2011  *RADIOLOGY REPORT*  Clinical Data: Left-sided chest pain and shortness of breath.  CHEST - 2 VIEW  Comparison: 05/17/2011  Findings: Two views of the chest demonstrate clear lungs. Heart and mediastinum are within normal limits.  Degenerative endplate changes in the thoracic spine.  No evidence for edema or pleural effusions.  IMPRESSION: No acute chest findings.  Original Report Authenticated By: ADAM HENN, M.D.    Medications:  Prior to Admission:  Prescriptions prior to admission  Medication Sig Dispense Refill  . albuterol (PROVENTIL HFA;VENTOLIN HFA) 108 (90 BASE) MCG/ACT inhaler Inhale 1-2 puffs into the lungs every 6 (six) hours as needed. For shortness of breath      . aspirin EC 325 MG tablet Take 325 mg by mouth daily.      . atenolol (TENORMIN) 50 MG tablet Take by mouth daily.       . ferrous sulfate 325 (65 FE) MG tablet Take 325 mg by mouth daily with breakfast.      . gabapentin (NEURONTIN) 300 MG capsule Take 300 mg by mouth daily as needed. For headaches      .   Iron-Vitamins (GERITOL) LIQD Take 30 mLs by mouth daily.      . losartan-hydrochlorothiazide (HYZAAR) 100-25 MG per tablet Take 1 tablet by mouth daily.        . omeprazole (PRILOSEC) 20 MG capsule Take 20 mg by mouth 2 (two) times daily.        . simvastatin (ZOCOR) 20 MG tablet Take 20 mg by mouth at bedtime.        . traMADol (ULTRAM) 50 MG tablet Take 50 mg by mouth every 6 (six) hours as needed. For pain. Maximum dose= 8 tablets per day       Scheduled:   . aspirin EC  325 mg Oral Daily  . atenolol  50 mg Oral  Daily  . enoxaparin  40 mg Subcutaneous q1800  . ferrous sulfate  325 mg Oral Q breakfast  . fluticasone  1 spray Each Nare Daily  . hydrochlorothiazide  25 mg Oral Daily  . losartan  100 mg Oral Daily  . nicotine  21 mg Transdermal Daily  . pantoprazole  40 mg Oral Q1200  . simvastatin  20 mg Oral QHS  . sodium chloride  3 mL Intravenous Q12H  . DISCONTD: sodium chloride   Intravenous STAT  . DISCONTD: Geritol  30 mL Oral Daily  . DISCONTD: losartan-hydrochlorothiazide  1 tablet Oral Daily   Continuous:   . sodium chloride 75 mL/hr at 05/31/11 0339    Assessment/Plan: Principal Problem:  *Chest pain, atypical Active Problems:  HYPERLIPIDEMIA  TOBACCO ABUSE  HYPERTENSION  PVD   At this time will await stress test, I will try to discuss with cardiology if any additional evaluation is indicated. At this time his chest pain is felt to be atypical however he does have risk factors for coronary disease. To date there has been no objective evidence of ischemia i.e. Normal EKG, normal cardiac enzymes. Normal stress test in the past   LOS: 2 days   Dublin Cantero D 05/31/2011, 12:46 PM     

## 2011-06-01 NOTE — Interval H&P Note (Signed)
History and Physical Interval Note:  06/01/2011 7:50 AM  Joel [name]  has presented today for surgery, with the diagnosis of Chest pain  The various methods of treatment have been discussed with the patient. After consideration of risks, benefits and other options for treatment, the patient has consented to  Procedure(s) (LRB): LEFT HEART CATHETERIZATION WITH CORONARY ANGIOGRAM (N/A) as a surgical intervention .  The patients' history has been reviewed, patient examined, no change in status, stable for surgery.  I have reviewed the patients' chart and labs.  Questions were answered to the patient's satisfaction.  The patient was seen in consultation by Dr. Juanito Doom who recommended cath.  I spoke with the patient this morning.  I rereviewed what Dr. Daleen Squibb had told him from yesterday.  I answered questions to the patients satisfaction.  Plan for cath.       Joel [name]

## 2011-06-01 NOTE — Op Note (Signed)
   Cardiac Catheterization Procedure Note  Name: Joel [name] MRN: [id number] DOB: Sep 01, 1940  Procedure: Left Heart Cath, Selective Coronary Angiography, LV angiography  Indication: chest pain with negative enzymes.     Procedural Details: The right wrist was prepped, draped, and anesthetized with 1% lidocaine. Using the modified Seldinger technique, a 5 French sheath was introduced into the right radial artery. 3 mg of verapamil was administered through the sheath, weight-based unfractionated heparin was administered intravenously. Standard Judkins catheters were used for selective coronary angiography and left ventriculography. Catheter exchanges were performed over an exchange length guidewire. There were no immediate procedural complications. A TR band was used for radial hemostasis at the completion of the procedure.  The patient was transferred to the post catheterization recovery area for further monitoring.  Procedural Findings: Hemodynamics: AO 122/65 (87) LV 151/9 Borderline gradient on pullback.   Coronary angiography: Coronary dominance: right  Left mainstem: No significant obstruction  Left anterior descending (LAD): Large and long with significant ectasia. 50% mid stenosis that is eccentric with involvement of small diagonal with 50-60% lesion.  Distal LAD wraps the apex and supplies the distal inferior wall.  Left circumflex (LCx): large caliber.  Ectasia with small ramus. 40% proximal vessel OM 1 is small, and severely diseased, totally occluded in mid.  OM2 has eccentric 90% lesion in moderate size OM2 and collateralizes OM1.    Right coronary artery (RCA): Marked ectasia and large caliber vessel.  40-50% proximal lesion, 70% mid vessel narrowing at acute margin.  Diffuse ectasia in distal vessel.  Diffuse plaquing of small PDA.  Small PLA  Left ventriculography: Left ventricular systolic function is normal, LVEF is estimated at 55-65%, there is trace angiographic  mitral regurgitation   Final Conclusions:   1.  Preserved LV function 2.  Diffuse ectasia 3.  Diffuse disease of the RCA 4.  Total occlusion of the OM1 5.  Significant disease of the OM2  Recommendations:  1.  Patient has microcytic anemia with MCV 70, and needs evaluation of this.  Options include initial medical therapy with sig risk factor reduction, and antiplatelet therapy if stools negative.  OM2 could be stented, but there is somewhat diffuse plaque in multiple vessels.  If chest pain continues, the PCI could be done with the caveat that DAPT may be problematic.   Will review with Dr. Nehemiah Settle.    [name] 06/01/2011, 9:13 AM

## 2011-06-02 ENCOUNTER — Ambulatory Visit: Payer: Medicare Other | Admitting: Vascular Surgery

## 2011-06-02 DIAGNOSIS — R079 Chest pain, unspecified: Secondary | ICD-10-CM

## 2011-06-02 MED ORDER — ISOSORBIDE MONONITRATE ER 30 MG PO TB24: 30.0000 mg | ORAL_TABLET | Freq: Every day | ORAL | Status: DC

## 2011-06-02 MED ORDER — NICOTINE 21 MG/24HR TD PT24: 1.0000 | MEDICATED_PATCH | Freq: Every day | TRANSDERMAL | Status: AC

## 2011-06-02 MED ORDER — GUAIFENESIN-DM 100-10 MG/5ML PO SYRP
5.0000 mL | ORAL_SOLUTION | ORAL | Status: DC | PRN
  Administered 2011-06-02 (×2): 5 mL via ORAL
  Filled 2011-06-02 (×2): qty 5

## 2011-06-02 MED ORDER — PNEUMOCOCCAL VAC POLYVALENT 25 MCG/0.5ML IJ INJ: 0.5000 mL | INJECTION | INTRAMUSCULAR | Status: DC

## 2011-06-02 NOTE — Progress Notes (Signed)
Subjective:  I reviewed with patient in detail.  I spent more than thirty minutes yesterday explaining all the potential treatment options.  He does not want a stent at this time, and prefers to be treated medically after all of the relevant issues were reviewed.  He understands the elements of medical therapy which I feel to be critical, including compliance, avoidance of excess stress, and discontinuation of smoking.  Would add long acting nitrates.  It is still not clear that his pain is related to cardiac issues, but certainly could be.   Objective:  Vital Signs in the last 24 hours: Temp:  [97.7 F (36.5 C)-98.8 F (37.1 C)] 98 F (36.7 C) (02/20 0428) Pulse Rate:  [56-65] 60  (02/20 0428) Resp:  [20] 20  (02/19 1751) BP: (131-156)/(66-90) 150/90 mmHg (02/20 0428) SpO2:  [92 %-99 %] 92 % (02/20 0428)  Intake/Output from previous day: 02/19 0701 - 02/20 0700 In: 960 [P.O.:960] Out: -    Physical Exam: General: Well developed, well nourished, in no acute distress. Head:  Normocephalic and atraumatic. Lungs: Clear to auscultation and percussion. Heart: Normal S1 and S2.  No murmur, rubs or gallops.  Pulses: Pulses normal in all 4 extremities. Extremities: No clubbing or cyanosis. No edema. Neurologic: Alert and oriented x 3.    Lab Results:  Basename 05/31/11 0254 05/30/11 1449  WBC 5.4 6.1  HGB 12.1* 12.2*  PLT 142* 146*    Basename 05/31/11 0254 05/30/11 1449  NA 141 --  K 3.9 --  CL 105 --  CO2 28 --  GLUCOSE 100* --  BUN 11 --  CREATININE 1.20 1.11    Basename 05/31/11 0254 05/30/11 1922  TROPONINI <0.30 <0.30   Hepatic Function Panel No results found for this basename: PROT,ALBUMIN,AST,ALT,ALKPHOS,BILITOT,BILIDIR,IBILI in the last 72 hours  Basename 05/31/11 0254  CHOL 144   No results found for this basename: PROTIME in the last 72 hours  Imaging: No results found.    Assessment/Plan:  Patient Active Hospital Problem List: Chest pain, atypical  (05/30/2011)   Assessment: Cath findings noted   Plan: very long discussion again reviewed with patient this am.  He understands the issues.  Medical therapy for his CAD is recommended, with the addition of nitrates to his regimen. Asa 81 mg should be satisfactory.   HYPERLIPIDEMIA (04/21/2007)   Assessment: currently on statins   Plan: follow up with Dr. Nehemiah Settle TOBACCO ABUSE (04/25/2007)   Assessment: reviewed elements of dc to patient   Plan: currently using patches HYPERTENSION (04/21/2007)   Assessment: key element in treatment   Plan: follow up with Dr. Nehemiah Settle PVD (10/03/2009)   Assessment: dc of smoking is critical   Plan: He has been previously instructed on this and does understand.    Will arrange follow up in cardiology in two weeks.        [name], MD, [facility], FSCAI 06/02/2011, 9:05 AM

## 2011-06-02 NOTE — Discharge Instructions (Signed)
Chest Pain (Nonspecific) It is often hard to give a specific diagnosis for the cause of chest pain. There is always a chance that your pain could be related to something serious, such as a heart attack or a blood clot in the lungs. You need to follow up with your caregiver for further evaluation. CAUSES   Heartburn.   Pneumonia or bronchitis.   Anxiety and stress.   Inflammation around your heart (pericarditis) or lung (pleuritis or pleurisy).   A blood clot in the lung.   A collapsed lung (pneumothorax). It can develop suddenly on its own (spontaneous pneumothorax) or from injury (trauma) to the chest.  The chest wall is composed of bones, muscles, and cartilage. Any of these can be the source of the pain.  The bones can be bruised by injury.   The muscles or cartilage can be strained by coughing or overwork.   The cartilage can be affected by inflammation and become sore (costochondritis).  DIAGNOSIS  Lab tests or other studies, such as X-rays, an EKG, stress testing, or cardiac imaging, may be needed to find the cause of your pain.  TREATMENT   Treatment depends on what may be causing your chest pain. Treatment may include:   Acid blockers for heartburn.   Anti-inflammatory medicine.   Pain medicine for inflammatory conditions.   Antibiotics if an infection is present.   You may be advised to change lifestyle habits. This includes stopping smoking and avoiding caffeine and chocolate.   You may be advised to keep your head raised (elevated) when sleeping. This reduces the chance of acid going backward from your stomach into your esophagus.   Most of the time, nonspecific chest pain will improve within 2 to 3 days with rest and mild pain medicine.  HOME CARE INSTRUCTIONS   If antibiotics were prescribed, take the full amount even if you start to feel better.   For the next few days, avoid physical activities that bring on chest pain. Continue physical activities as  directed.   Do not smoke cigarettes or drink alcohol until your symptoms are gone.   Only take over-the-counter or prescription medicine for pain, discomfort, or fever as directed by your caregiver.   Follow your caregiver's suggestions for further testing if your chest pain does not go away.   Keep any follow-up appointments you made. If you do not go to an appointment, you could develop lasting (chronic) problems with pain. If there is any problem keeping an appointment, you must call to reschedule.  SEEK MEDICAL CARE IF:   You think you are having problems from the medicine you are taking. Read your medicine instructions carefully.   Your chest pain does not go away, even after treatment.   You develop a rash with blisters on your chest.  SEEK IMMEDIATE MEDICAL CARE IF:   You have increased chest pain or pain that spreads to your arm, neck, jaw, back, or belly (abdomen).   You develop shortness of breath, an increasing cough, or you are coughing up blood.   You have severe back or abdominal pain, feel sick to your stomach (nauseous) or throw up (vomit).   You develop severe weakness, fainting, or chills.   You have an oral temperature above 102 F (38.9 C), not controlled by medicine.  THIS IS AN EMERGENCY. Do not wait to see if the pain will go away. Get medical help at once. Call your local emergency services (911 in [place] Do not drive yourself to   the hospital. MAKE SURE YOU:   Understand these instructions.   Will watch your condition.   Will get help right away if you are not doing well or get worse.  Document Released: 01/06/2005 Document Revised: 12/09/2010 Document Reviewed: 11/02/2007 [employer] Patient Information 2012 [employer]. 

## 2011-06-02 NOTE — Discharge Summary (Signed)
Physician Discharge Summary  [name] MRN: [id number] DOB/AGE: 72/72/1942 72 y.o.  Admit date: 05/29/2011 Discharge date: 06/02/2011  Admission Diagnoses: Chest pain  Discharge Diagnoses:  Chest pain, enzymes negative for ischemia, EKG without acute changes, heart catheterization with diffuse disease in circumflex and RCA, at this time patient not interested and repeat heart catheterization and stents he will like to try medication Including long-acting nitrates and aspirin  Hypertension   Tobacco dependence, patient states he's ready to stop   History of iron deficient anemia, current iron studies normal, see note from 2/19 for further details, further outpatient followup As course dictates   Hyperlipidemia, LDL goal less than 70   Peripheral vascular disease status post repair of iliac aneurysm  GERD    Discharged Condition: stable  Hospital Course:  Patient presented to the hospital with complaint of chest pain, because of his risk factors for coronary artery disease in addition was deemed necessary for further evaluation and treatment. Of note patient has had negative stress test in the past. His chest pain was nonexertional in etiology. However because of his risk factors and his recurrent visits to the ER he was admitted, enzymes were negative for ischemia, EKG without acute abnormality. Patient underwent heart catheterization, Which revealed diffuse disease in the circumflex system as well as the RCA. There was concern because of patient's prior history of iron deficient anemia about placing drug-eluting stent as he would require antiplatelet therapy for a prolonged period of time. Patient ultimately refused repeat cardiac catheterization and stenting and preferred medical therapy. There were no hospital complications no recurrent chest pain while hospitalized. We did discuss with patient the need to aggressively treat his risk factors including get an LDL to  goal, tobacco cessation, optimal BP management.  Consults: Treatment Team:  Valera Castle, MD  Significant Diagnostic Studies:Dg Chest 2 View  05/29/2011  *RADIOLOGY REPORT*  Clinical Data: Left-sided chest pain and shortness of breath.  CHEST - 2 VIEW  Comparison: 05/17/2011  Findings: Two views of the chest demonstrate clear lungs. Heart and mediastinum are within normal limits.  Degenerative endplate changes in the thoracic spine.  No evidence for edema or pleural effusions.  IMPRESSION: No acute chest findings.  Original Report Authenticated By: Richarda Overlie, M.D.   Dg Chest 2 View  05/17/2011  *RADIOLOGY REPORT*  Clinical Data: The head and left-sided chest pain.  CHEST - 2 VIEW  Comparison: 03/19/2011.  Findings: The lungs are clear.  The heart and mediastinal structures are normal.  Thoracic osteophytosis is noted.  IMPRESSION: No evidence for active chest disease.  Original Report Authenticated By: Rolla Plate, M.D.   Heart catheterization Results as discussed above  Discharge Exam: Blood pressure 145/84, pulse 52, temperature 97.5 F (36.4 C), temperature source Oral, resp. rate 18, height 5\' 7"  (1.702 m), weight 99.791 kg (220 lb), SpO2 98.00%.  General appearance: alert and cooperative  Resp: clear to auscultation bilaterally  Cardio: regular rate and rhythm, S1, S2 normal, no murmur, click, rub or gallop  Extremities: extremities normal, atraumatic, no cyanosis or edema      Disposition: 01-Home or Self Care   Medication List  As of 06/02/2011 12:45 PM   TAKE these medications         albuterol 108 (90 BASE) MCG/ACT inhaler   Commonly known as: PROVENTIL HFA;VENTOLIN HFA   Inhale 1-2 puffs into the lungs every 6 (six) hours as needed. For shortness of breath      aspirin EC  325 MG tablet   Take 325 mg by mouth daily.      ferrous sulfate 325 (65 FE) MG tablet   Take 325 mg by mouth daily with breakfast.      gabapentin 300 MG capsule   Commonly known as: NEURONTIN    Take 300 mg by mouth daily as needed. For headaches      Geritol Liqd   Take 30 mLs by mouth daily.      isosorbide mononitrate 30 MG 24 hr tablet   Commonly known as: IMDUR   Take 1 tablet (30 mg total) by mouth daily.      losartan-hydrochlorothiazide 100-25 MG per tablet   Commonly known as: HYZAAR   Take 1 tablet by mouth daily.      nicotine 21 mg/24hr patch   Commonly known as: NICODERM CQ - dosed in mg/24 hours   Place 1 patch onto the skin daily.      omeprazole 20 MG capsule   Commonly known as: PRILOSEC   Take 20 mg by mouth 2 (two) times daily.      simvastatin 20 MG tablet   Commonly known as: ZOCOR   Take 20 mg by mouth at bedtime.      TENORMIN 50 MG tablet   Generic drug: atenolol   Take by mouth daily.      traMADol 50 MG tablet   Commonly known as: ULTRAM   Take 50 mg by mouth every 6 (six) hours as needed. For pain. Maximum dose= 8 tablets per day           Follow-up Information    Follow up with Katy Apo, MD.         SignedRenford Dills D 06/02/2011, 12:45 PM

## 2011-06-02 NOTE — Progress Notes (Signed)
Subjective: Patient pleasant, no apparent distress, no recurrent chest pain. At this time not interested in repeat heart catheterization and stent, prefers to try medication.  Objective: Vital signs in last 24 hours: Temp:  [97.7 F (36.5 C)-98.8 F (37.1 C)] 98 F (36.7 C) (02/20 0428) Pulse Rate:  [56-65] 60  (02/20 0428) Resp:  [20] 20  (02/19 1751) BP: (131-156)/(66-90) 150/90 mmHg (02/20 0428) SpO2:  [92 %-99 %] 92 % (02/20 0428) Weight change:  Last BM Date: 06/01/11  Intake/Output from previous day: 02/19 0701 - 02/20 0700 In: 960 [P.O.:960] Out: -   Intake/Output this shift:    General appearance: alert and cooperative Resp: clear to auscultation bilaterally Cardio: regular rate and rhythm, S1, S2 normal, no murmur, click, rub or gallop Extremities: extremities normal, atraumatic, no cyanosis or edema  Lab Results:  Results for orders placed during the hospital encounter of 05/29/11 (from the past 24 hour(s))  IRON AND TIBC     Status: Normal   Collection Time   06/01/11  3:08 PM      Component Value Range   Iron 56  42 - 135 (ug/dL)   TIBC 161  096 - 045 (ug/dL)   Saturation Ratios 21  20 - 55 (%)   UIBC 207  125 - 400 (ug/dL)  RETICULOCYTES     Status: Normal   Collection Time   06/01/11  3:08 PM      Component Value Range   Retic Ct Pct 1.1  0.4 - 3.1 (%)   RBC. 5.09  4.22 - 5.81 (MIL/uL)   Retic Count, Manual 56.0  19.0 - 186.0 (K/uL)  FERRITIN     Status: Normal   Collection Time   06/01/11  3:08 PM      Component Value Range   Ferritin 240  22 - 322 (ng/mL)      Studies/Results: No results found.  Medications:  Prior to Admission:  Prescriptions prior to admission  Medication Sig Dispense Refill  . albuterol (PROVENTIL HFA;VENTOLIN HFA) 108 (90 BASE) MCG/ACT inhaler Inhale 1-2 puffs into the lungs every 6 (six) hours as needed. For shortness of breath      . aspirin EC 325 MG tablet Take 325 mg by mouth daily.      Marland Kitchen atenolol (TENORMIN) 50 MG  tablet Take by mouth daily.       . ferrous sulfate 325 (65 FE) MG tablet Take 325 mg by mouth daily with breakfast.      . gabapentin (NEURONTIN) 300 MG capsule Take 300 mg by mouth daily as needed. For headaches      . Iron-Vitamins (GERITOL) LIQD Take 30 mLs by mouth daily.      Marland Kitchen losartan-hydrochlorothiazide (HYZAAR) 100-25 MG per tablet Take 1 tablet by mouth daily.        Marland Kitchen omeprazole (PRILOSEC) 20 MG capsule Take 20 mg by mouth 2 (two) times daily.        . simvastatin (ZOCOR) 20 MG tablet Take 20 mg by mouth at bedtime.        . traMADol (ULTRAM) 50 MG tablet Take 50 mg by mouth every 6 (six) hours as needed. For pain. Maximum dose= 8 tablets per day       Scheduled:   . aspirin EC  325 mg Oral Daily  . atenolol  50 mg Oral Daily  . ferrous sulfate  325 mg Oral Q breakfast  . fluticasone  1 spray Each Nare Daily  . hydrochlorothiazide  25  mg Oral Daily  . losartan  100 mg Oral Daily  . nicotine  21 mg Transdermal Daily  . pantoprazole  40 mg Oral Q1200  . pneumococcal 23 valent vaccine  0.5 mL Intramuscular Tomorrow-1000  . simvastatin  20 mg Oral QHS  . sodium chloride  3 mL Intravenous Q12H  . DISCONTD: enoxaparin  40 mg Subcutaneous q1800   Continuous:   . sodium chloride    . DISCONTD: sodium chloride 75 mL/hr at 06/01/11 0612  . DISCONTD: sodium chloride 1 mL/kg/hr (06/01/11 0612)    Assessment/Plan: @HPROL @ Chest pain, enzymes negative for ischemia, EKG without acute changes, heart catheterization with diffuse disease in circumflex and RCA, at this time patient not interested and repeat heart catheterization and stents he will like to try medication. I will discuss with cardiology  Hypertension  Tobacco dependence, patient states he's ready to stop  History of iron deficient anemia, current iron studies normal, see note from 2/19 for further details, further outpatient followup As course dictates  Hyperlipidemia, LDL goal less than 70    LOS: 4 days    Danyiel Crespin D 06/02/2011, 9:07 AM

## 2011-06-05 ENCOUNTER — Encounter (HOSPITAL_COMMUNITY): Payer: Self-pay | Admitting: *Deleted

## 2011-06-11 ENCOUNTER — Emergency Department (HOSPITAL_COMMUNITY): Payer: Medicare Other

## 2011-06-11 ENCOUNTER — Other Ambulatory Visit: Payer: Self-pay

## 2011-06-11 ENCOUNTER — Encounter (HOSPITAL_COMMUNITY): Payer: Self-pay | Admitting: *Deleted

## 2011-06-11 ENCOUNTER — Emergency Department (HOSPITAL_COMMUNITY)
Admission: EM | Admit: 2011-06-11 | Discharge: 2011-06-11 | Disposition: A | Discharge status: Home / Self Care | Payer: Medicare Other | Attending: Emergency Medicine | Admitting: Emergency Medicine

## 2011-06-11 DIAGNOSIS — R29898 Other symptoms and signs involving the musculoskeletal system: Secondary | ICD-10-CM | POA: Insufficient documentation

## 2011-06-11 DIAGNOSIS — R519 Headache, unspecified: Secondary | ICD-10-CM | POA: Insufficient documentation

## 2011-06-11 DIAGNOSIS — Z79899 Other long term (current) drug therapy: Secondary | ICD-10-CM | POA: Insufficient documentation

## 2011-06-11 DIAGNOSIS — R0602 Shortness of breath: Secondary | ICD-10-CM | POA: Insufficient documentation

## 2011-06-11 DIAGNOSIS — R51 Headache: Secondary | ICD-10-CM

## 2011-06-11 DIAGNOSIS — E785 Hyperlipidemia, unspecified: Secondary | ICD-10-CM | POA: Insufficient documentation

## 2011-06-11 DIAGNOSIS — K219 Gastro-esophageal reflux disease without esophagitis: Secondary | ICD-10-CM | POA: Insufficient documentation

## 2011-06-11 DIAGNOSIS — R079 Chest pain, unspecified: Secondary | ICD-10-CM

## 2011-06-11 DIAGNOSIS — R209 Unspecified disturbances of skin sensation: Secondary | ICD-10-CM | POA: Insufficient documentation

## 2011-06-11 DIAGNOSIS — R200 Anesthesia of skin: Secondary | ICD-10-CM

## 2011-06-11 DIAGNOSIS — I10 Essential (primary) hypertension: Secondary | ICD-10-CM | POA: Insufficient documentation

## 2011-06-11 DIAGNOSIS — F17200 Nicotine dependence, unspecified, uncomplicated: Secondary | ICD-10-CM | POA: Insufficient documentation

## 2011-06-11 DIAGNOSIS — I739 Peripheral vascular disease, unspecified: Secondary | ICD-10-CM | POA: Insufficient documentation

## 2011-06-11 DIAGNOSIS — E669 Obesity, unspecified: Secondary | ICD-10-CM | POA: Insufficient documentation

## 2011-06-11 DIAGNOSIS — I2510 Atherosclerotic heart disease of native coronary artery without angina pectoris: Secondary | ICD-10-CM | POA: Insufficient documentation

## 2011-06-11 DIAGNOSIS — D509 Iron deficiency anemia, unspecified: Secondary | ICD-10-CM | POA: Insufficient documentation

## 2011-06-11 DIAGNOSIS — I208 Other forms of angina pectoris: Secondary | ICD-10-CM

## 2011-06-11 DIAGNOSIS — Z87891 Personal history of nicotine dependence: Secondary | ICD-10-CM | POA: Diagnosis present

## 2011-06-11 HISTORY — DX: Atherosclerotic heart disease of native coronary artery without angina pectoris: I25.10

## 2011-06-11 HISTORY — DX: Iron deficiency anemia, unspecified: D50.9

## 2011-06-11 LAB — CBC
HCT: 37.9 % — ABNORMAL LOW (ref 39.0–52.0)
Hemoglobin: 11.9 g/dL — ABNORMAL LOW (ref 13.0–17.0)
MCH: 22.4 pg — ABNORMAL LOW (ref 26.0–34.0)
MCHC: 31.4 g/dL (ref 30.0–36.0)
MCV: 71.2 fL — ABNORMAL LOW (ref 78.0–100.0)
Platelets: 179 10*3/uL (ref 150–400)
RBC: 5.32 MIL/uL (ref 4.22–5.81)
RDW: 16.7 % — ABNORMAL HIGH (ref 11.5–15.5)
WBC: 4.9 10*3/uL (ref 4.0–10.5)

## 2011-06-11 LAB — COMPREHENSIVE METABOLIC PANEL
ALT: 20 U/L (ref 0–53)
AST: 17 U/L (ref 0–37)
Albumin: 3.7 g/dL (ref 3.5–5.2)
Alkaline Phosphatase: 74 U/L (ref 39–117)
BUN: 9 mg/dL (ref 6–23)
CO2: 28 mEq/L (ref 19–32)
Calcium: 9.8 mg/dL (ref 8.4–10.5)
Chloride: 106 mEq/L (ref 96–112)
Creatinine, Ser: 1.1 mg/dL (ref 0.50–1.35)
GFR calc Af Amer: 76 mL/min — ABNORMAL LOW (ref >90)
GFR calc non Af Amer: 66 mL/min — ABNORMAL LOW (ref >90)
Glucose, Bld: 89 mg/dL (ref 70–99)
Potassium: 4 mEq/L (ref 3.5–5.1)
Sodium: 142 mEq/L (ref 135–145)
Total Bilirubin: 0.2 mg/dL — ABNORMAL LOW (ref 0.3–1.2)
Total Protein: 7.4 g/dL (ref 6.0–8.3)

## 2011-06-11 LAB — POCT I-STAT TROPONIN I: Troponin i, poc: 0 ng/mL (ref 0.00–0.08)

## 2011-06-11 MED ORDER — ASPIRIN 81 MG PO CHEW: 324.0000 mg | CHEWABLE_TABLET | Freq: Once | ORAL | Status: DC

## 2011-06-11 MED ORDER — ASPIRIN 81 MG PO TBEC: 325.0000 mg | DELAYED_RELEASE_TABLET | Freq: Every day | ORAL | Status: DC

## 2011-06-11 MED ORDER — NITROGLYCERIN 0.4 MG SL SUBL: 0.4000 mg | SUBLINGUAL_TABLET | SUBLINGUAL | Status: DC | PRN

## 2011-06-11 MED ORDER — ASPIRIN-DIPYRIDAMOLE ER 25-200 MG PO CP12: 1.0000 | ORAL_CAPSULE | Freq: Two times a day (BID) | ORAL | Status: DC

## 2011-06-11 MED ORDER — AMLODIPINE BESYLATE 5 MG PO TABS: 5.0000 mg | ORAL_TABLET | Freq: Every day | ORAL | Status: DC

## 2011-06-11 NOTE — ED Provider Notes (Signed)
I saw and evaluated the patient, reviewed the resident's note and I agree with the findings and plan.  71 year old male with headache. Patient reports gradual onset of headache this after discharge at a recent hospitalization. Patient describes the headache is diffuse and achy in character. Cannot remember what he was doing an onset. Relatively constant. No appreciable exacerbating relieving factors. No acute visual changes. No fevers or chills. No neck pain or neck stiffness. Patient has a nonfocal neurological examination his complaints her concern for possible TIA. CT of the head did not show any acute findings. Discussed with patient recommendation for motion. He is coming at this point in time. He has medical decision-making ability understands risks and benefits of further evaluation.  Return precautions were discussed. Outpatient followup.  Raeford Razor, MD 06/16/11 [phone]

## 2011-06-11 NOTE — Progress Notes (Signed)
I was called by Dr Lendell Caprice and Dr Juleen China to admit this patient for headache ,left leg weakness and chest pain .I went to see the patient and he told me that he never mentioned to any one that his left leg was weak and that all what he mentioned that his left foot was heavy for few seconds and his sock was wet .Patient was upset and he told me that he does not want to be admitted and want to go home .I informed Dr Lendell Caprice and Dr Juleen China with patient's request.Patient was alert and answering questions appropriately ,however he was not interested on giving more history or being examined .

## 2011-06-11 NOTE — ED Notes (Signed)
Pt reports that he has been headache since discharge on the 20th. States saw pcp yesterday. States today he noticed that his left leg was dragging then he had tightness in the center of his chest. Pt denies any chest pain at this time. Denies any numbness tingling or weakness to left lower leg at this time. Stroke scale negative.

## 2011-06-11 NOTE — Consult Note (Signed)
[name] MRN: [id number], DOB/AGE: [birth year]   Admit date: 06/11/2011   Primary Physician: Katy Apo, MD, MD Primary Cardiologist: D. Bensimhon, MD  Pt. Profile:  71 y/o male with recent admission for chest pain, found to have significant CAD - currently medically managed - who presents with headache and left foot/leg weakness.  Problem List  Past Medical History  Diagnosis Date  . GERD (gastroesophageal reflux disease)   . HLD (hyperlipidemia)   . HTN (hypertension)   . Tobacco abuse   . Cough secondary to angiotensin converting enzyme inhibitor (ACE-I)   . Rotator cuff injury   . Angina   . Peripheral vascular disease   . CAD (coronary artery disease)     A. Negative MV 04/2010;  B. Inadequate Stress Echo 05/2011;  C. 05/29/11 Cath: LM-NL, LAD-71m, LCX- 40p, OM1 small 18m, OM2 90, RCA 40-50p, 21m, EF 55-65% - Med Rx   . Iron deficiency anemia     Past Surgical History  Procedure Date  . Iliac artery aneurysm repair   . Circumcision   . Pr vein bypass graft,aorto-fem-pop 10/01/2009    left below knee popliteal artery to posterior tibial artery    Allergies  Allergies  Allergen Reactions  . Lisinopril     REACTION: Cough   HPI 71 y/o male with the above problem list.  We recently saw the patient in consultation in mid Feb 2/2 exertional c/p.  He underwent diag cath revealing signif OM1 & OM2 dzs with moderate LAD & RCA dzs.  The OM2 was felt to be the culprit but in the setting of microcytic anemia, rec was made for initial medical therapy.  Pt was d/c on imdur 30, bb, asa, & statin.  Following d/c he has had intermittent 4/10 left sided chest pain w/o assoc Ss, occurring almost exclusively @ rest and often improved by walking around.  When chest pain occurs it lasts about an hour and resolves spontaneously.  He has not taken SL NTG for this discomfort.  Of note, since d/c, pt has had profound headaches which he attributes to isosorbide therapy.   He has tried halving his dose but this doesn't seem to make a difference.  Today, pt had his usual headache but also developed left foot wkns and heaviness, "like my sock was wet."  He says at times it was difficult to pick his left foot up to walk.  Because of this symptom, in combination with the headache, he presented to the ED.  After about 2 hrs, the foot wkns resolved.  A Head CT was performed here showing no acute findings with stable old right frontal and temporal lobe infarcts.  We've been asked to eval re: chest pain.  He is currently pain free.  Home Medications  Prior to Admission medications   Medication Sig Start Date End Date Taking? Authorizing Provider  albuterol (PROVENTIL HFA;VENTOLIN HFA) 108 (90 BASE) MCG/ACT inhaler Inhale 1-2 puffs into the lungs every 6 (six) hours as needed. For shortness of breath 03/10/11 03/09/12 Yes Felisa Bonier, MD  aspirin EC 325 MG tablet Take 325 mg by mouth daily.   Yes Historical Provider, MD  atenolol (TENORMIN) 50 MG tablet Take by mouth daily.    Yes Historical Provider, MD  ferrous sulfate 325 (65 FE) MG tablet Take 325 mg by mouth daily with breakfast.   Yes Historical Provider, MD  gabapentin (NEURONTIN) 300 MG capsule Take 300 mg by mouth daily as needed. For headaches  Yes Historical Provider, MD  Iron-Vitamins (GERITOL) LIQD Take 30 mLs by mouth daily.   Yes Historical Provider, MD  isosorbide mononitrate (IMDUR) 30 MG 24 hr tablet Take 1 tablet (30 mg total) by mouth daily. 06/02/11 06/01/12 Yes Katy Apo, MD  losartan-hydrochlorothiazide (HYZAAR) 100-25 MG per tablet Take 1 tablet by mouth daily.     Yes Historical Provider, MD  nicotine (NICODERM CQ - DOSED IN MG/24 HOURS) 21 mg/24hr patch Place 1 patch onto the skin daily. 06/02/11 07/02/11 Yes Katy Apo, MD  omeprazole (PRILOSEC) 20 MG capsule Take 20 mg by mouth 2 (two) times daily.     Yes Historical Provider, MD  simvastatin (ZOCOR) 20 MG tablet Take 40 mg by mouth at  bedtime.    Yes Historical Provider, MD  traMADol (ULTRAM) 50 MG tablet Take 50 mg by mouth every 6 (six) hours as needed. For pain. Maximum dose= 8 tablets per day   Yes Historical Provider, MD  Family History  Family History  Problem Relation Age of Onset  . Breast cancer Mother     deceased  . COPD Father     deceased   Social History  History   Social History  . Marital Status: Widowed    Spouse Name: N/A    Number of Children: N/A  . Years of Education: N/A   Occupational History  . Not on file.   Social History Main Topics  . Smoking status: Current Everyday Smoker -- 1.0 packs/day for 55 years    Types: Cigarettes  . Smokeless tobacco: Current User  . Alcohol Use: No  . Drug Use: No  . Sexually Active:    Other Topics Concern  . Not on file   Social History Narrative   Single. Retired.      Review of Systems General:  No chills, fever, night sweats or weight changes.  Cardiovascular:  ++ chest pain.  No dyspnea on exertion, edema, orthopnea, palpitations, paroxysmal nocturnal dyspnea. Dermatological: No rash, lesions/masses Respiratory: No cough, dyspnea Urologic: No hematuria, dysuria Abdominal:   No nausea, vomiting, diarrhea, bright red blood per rectum, melena, or hematemesis Neurologic:  Headache as outlined above.  No visual changes, wkns, changes in mental status. All other systems reviewed and are otherwise negative except as noted above.  Physical Exam  Blood pressure 149/80, pulse 53, temperature 98.9 F (37.2 C), temperature source Oral, resp. rate 20, SpO2 99.00%.  General: Pleasant, NAD Psych: Normal affect. Neuro: Alert and oriented X 3. Moves all extremities spontaneously. HEENT: Normal  Neck: Supple without bruits or JVD. Lungs:  Resp regular and unlabored, CTA. Heart: RRR no s3, s4, or murmurs. Abdomen: Soft, non-tender, non-distended, BS + x 4.  Extremities: No clubbing, cyanosis or edema.  PT/Radials 2+ and equal  bilaterally.  Labs  Lab Results  Component Value Date   WBC 4.9 06/11/2011   HGB 11.9* 06/11/2011   HCT 37.9* 06/11/2011   MCV 71.2* 06/11/2011   PLT 179 06/11/2011     Lab 06/11/11 1631  NA 142  K 4.0  CL 106  CO2 28  BUN 9  CREATININE 1.10  CALCIUM 9.8  PROT 7.4  BILITOT 0.2*  ALKPHOS 74  ALT 20  AST 17  GLUCOSE 89   Lab Results  Component Value Date   CHOL 144 05/31/2011   HDL 35* 05/31/2011   LDLCALC 88 05/31/2011   TRIG 103 05/31/2011    Radiology/Studies  Dg Chest 2 View 06/11/2011  Findings: The lungs  are clear.  Mild peribronchial thickening is noted.  Mediastinal contours appear stable.  The heart is mildly enlarged and stable.  There are degenerative changes throughout the thoracic spine.  IMPRESSION: No active lung disease.  Mild cardiomegaly.  Mild peribronchial thickening.     Ct Head Wo Contrast  IMPRESSION:  1.  No acute intracranial abnormality. 2.  Stable old right frontal and temporal lobe infarcts.   ECG: SB, 51, no acute changes.  ASSESSMENT AND PLAN  1.  CAD/Stable Angina:  Asked to see pt 2/2 c/p at rest - actually improves with ambulation.  Ss typically last about an hour but don't limit his activity.  He has known significant OM2 dzs, which is amenable to PCI but both the pt and interventional team prefer a trial of medical therapy.  Pt remains disinterested in repeat cath/stenting.  His bigger concern is the headache created by his imdur.  We will d/c imdur and instead add amlodipine 5 mg daily.  OTW cont asa 81, bb (wouldn't titrate with baseline bradycardia), and statin.  We will arrange for an outpt lexiscan myoview to assess for lateral wall ischemia and help guide our therapy.  He otw has f/u in our office next week.  2.  Left foot heaviness:  ? TIA.  Head CT negative.  Would add aggrenox bid.  Defer any additional neuro workup to ER if felt to be appropriate.  3.  HTN:  BP up in ER.  Adding amlodipine.  4.  HL:  Cont statin.  5.  Tob Abuse:  Pt  has quit and is using a nicotine patch.  I congratulated and encouraged him.  Signed, Nicolasa Ducking, NP 06/11/2011, 7:21 PM   Cardiology Attending Patient interviewed and examined. Discussed with Nicolasa Ducking, NP.  Above note annotated and modified based upon my findings.  Patient's chest pain is chronic and atypical. In light of his moderately severe coronary disease and the possibility of percutaneous intervention that has previously been raised with him, a stress test should be performed to determine if his symptoms are reproducible by exertion and if there is objective evidence for myocardial ischemia. Otherwise, I would be concerned that intervention would not result in resolution of his current symptoms.  Isosorbide mononitrate is a relatively ineffective antianginal agent and appears to be resulting in persistent serious headaches. Amlodipine will be substituted for this medication is to better treat blood pressure, control of which has been marginal, and to provide more effective antianginal treatment.  Transient lower extremity symptoms may have represented a TIA. He has known cerebrovascular disease, and addition of Aggrenox to his medical regime appears reasonable.  I see no need for hospital admission. We will arrange for continuing followup in the office.  Ridgeland Bing, MD 06/11/2011, 10:39 PM

## 2011-06-11 NOTE — Discharge Instructions (Signed)
PLEASE REMEMBER TO BRING ALL OF YOUR MEDICATIONS TO EACH OF YOUR FOLLOW-UP OFFICE VISITS.  Please pick up your new medications as prescribed by cardiology.  Call your doctor on Monday to discuss possible additional outpatient studies regarding your left foot tingling and "wet" sensation.  Return immediately for worsening chest pain, leg weakness or numbness, difficulty walking, or other concerns.

## 2011-06-11 NOTE — ED Provider Notes (Signed)
History     CSN: [id number]  Arrival date & time 06/11/11  1327   First MD Initiated Contact with Patient 06/11/11 1553      Chief Complaint  Patient presents with  . Headache  . Extremity Weakness  . Shortness of Breath  . Chest Pain    (Consider location/radiation/quality/duration/timing/severity/associated sxs/prior Treatment)  HPI History provided by the patient.  71 year old male with a history of CAD and recent past presenting with complaint of headache secondary to medication.  Patient states he was hospitalized February 16-20th for chest pain, cardiac cath, and was discharged on a new medication, isosorbide 30 mg by mouth. Patient states that he has had intermittent, frequent, generalized, throbbing, moderate to severe headaches since that time, which she believes is secondary to the medication. Patient has tried various things, such as making her tablets in half and skipping doses with only occasional noted improvement. Patient denies associated numbness or weakness but does admit to left lower extremity tingling in the sensation "as if it is wet," which was noted yesterday. Patient has had surgery in his left leg in 2011 and states that the symptoms are similar to his prior.  Patient denies dragging his left leg as noted in the nursing note.  Patient also admits to greater left-sided chest "heaviness", which started acutely around 1:00 to 1:30 today. His pain persisted until the time of ED arrival, around 2 PM. Patient reports associated shortness of breath without diaphoresis or nausea. No radiation and he was described initially as moderate and now is 2/10 in severity. This pain is similar to his prior cardiac pain; however, that this episode was worse than his baseline.  No appreciated exacerbating factors; patient noted this episode beginning at rest.    Patient was seen by PCP yesterday for his headache; however, no changes in medication or other recommendations were made at  that time.   Past Medical History  Diagnosis Date  . GERD (gastroesophageal reflux disease)   . HLD (hyperlipidemia)   . HTN (hypertension)   . Tobacco abuse   . Cough secondary to angiotensin converting enzyme inhibitor (ACE-I)   . Rotator cuff injury   . Angina   . Peripheral vascular disease   . Chest pain     A. Negative MV 04/2010;  B. Inadequate Stress Echo 05/2011    Past Surgical History  Procedure Date  . Iliac artery aneurysm repair   . Circumcision   . Pr vein bypass graft,aorto-fem-pop 10/01/2009    left below knee popliteal artery to posterior tibial artery    Family History  Problem Relation Age of Onset  . Breast cancer Mother   . COPD Father     History  Substance Use Topics  . Smoking status: Current Everyday Smoker -- 1.0 packs/day for 55 years    Types: Cigarettes  . Smokeless tobacco: Current User  . Alcohol Use: No      Review of Systems  Constitutional: Negative for fever, chills and diaphoresis.  HENT: Negative for congestion, sore throat and rhinorrhea.   Eyes: Negative for pain and visual disturbance.  Respiratory: Positive for shortness of breath. Negative for cough.   Cardiovascular: Positive for chest pain. Negative for palpitations and leg swelling.  Gastrointestinal: Negative for nausea, vomiting, abdominal pain and diarrhea.  Genitourinary: Negative for dysuria and hematuria.  Musculoskeletal: Negative for back pain and gait problem.  Skin: Negative for rash and wound.  Neurological: Positive for numbness (described as tingling in his LLE similar to  prior). Negative for dizziness, speech difficulty, weakness and headaches.  Psychiatric/Behavioral: Negative for confusion and agitation.  All other systems reviewed and are negative.    Allergies  Lisinopril  Home Medications   Current Outpatient Rx  Name Route Sig Dispense Refill  . ALBUTEROL SULFATE HFA 108 (90 BASE) MCG/ACT IN AERS Inhalation Inhale 1-2 puffs into the lungs  every 6 (six) hours as needed. For shortness of breath    . ASPIRIN EC 325 MG PO TBEC Oral Take 325 mg by mouth daily.    . ATENOLOL 50 MG PO TABS Oral Take by mouth daily.     Marland Kitchen FERROUS SULFATE 325 (65 FE) MG PO TABS Oral Take 325 mg by mouth daily with breakfast.    . GABAPENTIN 300 MG PO CAPS Oral Take 300 mg by mouth daily as needed. For headaches    . GERITOL TONIC PO LIQD Oral Take 30 mLs by mouth daily.    . ISOSORBIDE MONONITRATE ER 30 MG PO TB24 Oral Take 1 tablet (30 mg total) by mouth daily. 30 tablet 6  . LOSARTAN POTASSIUM-HCTZ 100-25 MG PO TABS Oral Take 1 tablet by mouth daily.      Marland Kitchen NICOTINE 21 MG/24HR TD PT24 Transdermal Place 1 patch onto the skin daily. 28 patch   . OMEPRAZOLE 20 MG PO CPDR Oral Take 20 mg by mouth 2 (two) times daily.      Marland Kitchen SIMVASTATIN 20 MG PO TABS Oral Take 40 mg by mouth at bedtime.     . TRAMADOL HCL 50 MG PO TABS Oral Take 50 mg by mouth every 6 (six) hours as needed. For pain. Maximum dose= 8 tablets per day      BP 141/64  Pulse 63  Temp(Src) 98.9 F (37.2 C) (Oral)  Resp 18  SpO2 98%  Physical Exam  Nursing note and vitals reviewed. Constitutional: He is oriented to person, place, and time.       Mildly obese, alert, NAD  HENT:  Head: Normocephalic and atraumatic.  Right Ear: External ear normal.  Left Ear: External ear normal.  Nose: Nose normal.  Mouth/Throat: Oropharynx is clear and moist.  Eyes: Conjunctivae and EOM are normal. Pupils are equal, round, and reactive to light.  Neck: Normal range of motion. Neck supple.  Cardiovascular: Normal rate, regular rhythm and intact distal pulses.   No murmur heard. Pulmonary/Chest: Effort normal and breath sounds normal. No respiratory distress.  Abdominal: Soft. Bowel sounds are normal. He exhibits no distension. There is no tenderness.  Musculoskeletal: Normal range of motion. He exhibits no edema.  Neurological: He is alert and oriented to person, place, and time.  Skin: Skin is dry.  No rash noted. He is not diaphoretic.  Psychiatric: He has a normal mood and affect. Judgment normal.    ED Course  Procedures (including critical care time)   Date: 06/11/2011  Rate: 51  Rhythm: sinus brady  QRS Axis: normal  Intervals: type 1 AV block  ST/T Wave abnormalities: nonspec ST abnl in precordial leads similar to prior on 05/31/11; no TWI  Old EKG Reviewed:  No significant changes noted   Labs Reviewed  CBC - Abnormal; Notable for the following:    Hemoglobin 11.9 (*)    HCT 37.9 (*)    MCV 71.2 (*)    MCH 22.4 (*)    RDW 16.7 (*)    All other components within normal limits  COMPREHENSIVE METABOLIC PANEL - Abnormal; Notable for the following:  Total Bilirubin 0.2 (*)    GFR calc non Af Amer 66 (*)    GFR calc Af Amer 76 (*)    All other components within normal limits  POCT I-STAT TROPONIN I   Dg Chest 2 View  06/11/2011  *RADIOLOGY REPORT*  Clinical Data: Chest pain and pressure, recent cardiac catheterization  CHEST - 2 VIEW  Comparison: Chest x-ray of 05/29/2011  Findings: The lungs are clear.  Mild peribronchial thickening is noted.  Mediastinal contours appear stable.  The heart is mildly enlarged and stable.  There are degenerative changes throughout the thoracic spine.  IMPRESSION: No active lung disease.  Mild cardiomegaly.  Mild peribronchial thickening.  Original Report Authenticated By: Juline Patch, M.D.   Ct Head Wo Contrast  06/11/2011  *RADIOLOGY REPORT*  Clinical Data: Headache.  Left lower extremity weakness.  CT HEAD WITHOUT CONTRAST  Technique:  Contiguous axial images were obtained from the base of the skull through the vertex without contrast.  Comparison: 06/05/2008  Findings: There is no evidence of intracranial hemorrhage, brain edema or other signs of acute infarction.  There is no evidence of intracranial mass lesion or mass effect.  No abnormal extra-axial fluid collections are identified.  Old cerebral infarcts are again seen involving the  right frontal and temporal lobes.  Ventricles remain normal in size.  No skull abnormality identified.  IMPRESSION:  1.  No acute intracranial abnormality. 2.  Stable old right frontal and temporal lobe infarcts.  Original Report Authenticated By: Danae Orleans, M.D.     1. Headache   2. Chest pain       MDM  71 y.o. M with h/o CAD presenting with c/o ~9 days of HA after starting imdur; pt also reporting LLE tingling similar to prior plus abnl sensation as if his "sock is wet," and Lt chest heaviness without exertional component, which resolved prior to ED arrival.  "Wet sock" sensation also resolving prior to my interview, and although triage notes mention "foot drag," pt denies this initially.  Exam as above, AF/VSS, nonfocal neuro exam, clear lungs.  CT head neg for acute ischemia; TIA possible although unclear Hx, as pt's stories seems to change with each provider.  Cards consulted and saw the pt; will d/c his imdur, start aggrenox and norvasc.  9:28 PM - D/w triad MD who will see the pt.    Pt desired d/c, now stating that his "foot drag" lasted only minutes and abnl "wet" sensation was no greater than 30 min.  Risks of TIA/CVA reviewed and pt still desires d/c, will f/u with his MD on Monday.  Strict return precautions reviewed.       Particia Lather, MD 06/12/11 [phone]

## 2011-06-14 ENCOUNTER — Telehealth: Payer: Self-pay | Admitting: *Deleted

## 2011-06-14 NOTE — Telephone Encounter (Signed)
Joel [name] said he has been in & out of the hospital so has had to cancel last few appts. However his Left leg is "acting funny". He says it feels"like something is moving around in it and it feels wet". He says that sometimes it feels like it "is giving out".He feels like he really needs to be soon. He has an appt for labs and Dr CSD in April. I made him an appt with the PA for Wed 06/16/11 at 2:40.

## 2011-06-15 ENCOUNTER — Encounter: Payer: Medicare Other | Admitting: Physician Assistant

## 2011-06-15 ENCOUNTER — Encounter: Payer: Self-pay | Admitting: Vascular Surgery

## 2011-06-16 ENCOUNTER — Encounter: Payer: Self-pay | Admitting: Physician Assistant

## 2011-06-16 ENCOUNTER — Ambulatory Visit (INDEPENDENT_AMBULATORY_CARE_PROVIDER_SITE_OTHER): Payer: Medicare Other | Admitting: Physician Assistant

## 2011-06-16 VITALS — BP 128/79 | HR 85 | Resp 20 | Ht 71.0 in | Wt 224.0 lb

## 2011-06-16 DIAGNOSIS — I70219 Atherosclerosis of native arteries of extremities with intermittent claudication, unspecified extremity: Secondary | ICD-10-CM | POA: Insufficient documentation

## 2011-06-16 NOTE — ED Provider Notes (Signed)
I saw and evaluated the patient, reviewed the resident's note and I agree with the findings and plan.  Please see my completed note for this encounter  Raeford Razor, MD 06/16/11 [phone]

## 2011-06-16 NOTE — Progress Notes (Signed)
[facility] HISTORY AND PHYSICAL   CC: Polite, Deirdre Peer, MD  HPI: This is a 71 y.o. male who has had a left below knee popliteal to posterior tibial artery bypass with non reversed translocated SVG from the left thigh on 10/01/09.  He comes in today b/c he has what he describes as a "funny feeling" in his left leg that has been going on for about a week.  He states that he feels like there is a ring around his ankle at times.  He does not have any claudication symptoms.  He denies any edema.  He states this has been going on for about a week.  He was in the hospital last month and underwent a heart cath, but no stents were placed per pt.  He states that he cannot take the Imdur b/c it gives him a headache.  He also has a headache with either the aggrenox or the Norvasc that he recently started, but it is not as bas as with the Imdur.  Pt had head CT in ER on 06/11/11 that revealed:  IMPRESSION: 1. No acute intracranial abnormality. 2. Stable old right frontal and temporal lobe infarcts   Past Medical History  Diagnosis Date  . GERD (gastroesophageal reflux disease)   . HLD (hyperlipidemia)   . HTN (hypertension)   . Tobacco abuse   . Cough secondary to angiotensin converting enzyme inhibitor (ACE-I)   . Rotator cuff injury   . Angina   . Peripheral vascular disease   . CAD (coronary artery disease)     A. Negative MV 04/2010;  B. Inadequate Stress Echo 05/2011;  C. 05/29/11 Cath: LM-NL, LAD-72m, LCX- 40p, OM1 small 157m, OM2 90, RCA 40-50p, 78m, EF 55-65% - Med Rx   . Iron deficiency anemia    Past Surgical History  Procedure Date  . Iliac artery aneurysm repair   . Circumcision   . Pr vein bypass graft,aorto-fem-pop 10/01/2009    left below knee popliteal artery to posterior tibial artery    Allergies  Allergen Reactions  . Lisinopril     REACTION: Cough    Current Outpatient Prescriptions  Medication Sig Dispense Refill  . albuterol (PROVENTIL  HFA;VENTOLIN HFA) 108 (90 BASE) MCG/ACT inhaler Inhale 1-2 puffs into the lungs every 6 (six) hours as needed. For shortness of breath      . amLODipine (NORVASC) 5 MG tablet Take 1 tablet (5 mg total) by mouth daily.  30 tablet  6  . aspirin EC 81 MG EC tablet Take 4 tablets (325 mg total) by mouth daily.  30 tablet    . atenolol (TENORMIN) 50 MG tablet Take by mouth daily.       Marland Kitchen dipyridamole-aspirin (AGGRENOX) 25-200 MG per 12 hr capsule Take 1 capsule by mouth 2 (two) times daily.  60 capsule  2  . ferrous sulfate 325 (65 FE) MG tablet Take 325 mg by mouth daily with breakfast.      . Iron-Vitamins (GERITOL) LIQD Take 30 mLs by mouth daily.      Marland Kitchen losartan-hydrochlorothiazide (HYZAAR) 100-25 MG per tablet Take 1 tablet by mouth daily.        . nicotine (NICODERM CQ - DOSED IN MG/24 HOURS) 21 mg/24hr patch Place 1 patch onto the skin daily.  28 patch    . omeprazole (PRILOSEC) 20 MG capsule Take 20 mg by mouth 2 (two) times daily.        . simvastatin (ZOCOR) 20 MG  tablet Take 40 mg by mouth at bedtime.       . traMADol (ULTRAM) 50 MG tablet Take 50 mg by mouth every 6 (six) hours as needed. For pain. Maximum dose= 8 tablets per day      . gabapentin (NEURONTIN) 300 MG capsule Take 300 mg by mouth daily as needed. For headaches        Family History  Problem Relation Age of Onset  . Breast cancer Mother     deceased  . COPD Father     deceased    History   Social History  . Marital Status: Widowed    Spouse Name: N/A    Number of Children: N/A  . Years of Education: N/A   Occupational History  . Not on file.   Social History Main Topics  . Smoking status: Former Smoker -- 1.0 packs/day for 55 years    Types: Cigarettes    Quit date: 05/26/2011  . Smokeless tobacco: Never Used  . Alcohol Use: No  . Drug Use: No  . Sexually Active: Not on file   Other Topics Concern  . Not on file   Social History Narrative   Single. Retired.      ROS: [x]  Positive   [ ]  Negative    [ ]  All sytems reviewed and are negative  Cardiovascular: [] chest pain; [] chest pressure; [] palpitations; [] SOB lying flat; [] DOE; [] pain in legs with walking; [] pain in legs when lying flat; [] Hx of DVT; [] Hx phlebitis; [] swelling in legs; [] varicose veins [x]  leg feel funny like something in it or that it has sensation of being wet. Pulmonary: [] productive cough; [] asthma; [] wheezing Neurologic:  [] Hx CVA;  [] weakness in arms or legs; [] numbness in arms or legs; [] difficulty in speaking or slurred speech; [] temporary loss of vision in one eye; [] dizziness Hematologic:  [] bleeding problems; [] clots easily GI:  [] vomiting blood; []  blood in stool; [] PUD GU: []  Dysuria; [] hematuria Psychiatric:  [] Hx major depression Integumentary:  [] rashes; [] ulcers Constitutional:  [] fever; [] chills    PHYSICAL EXAMINATION:  Filed Vitals:   06/16/11 1530  BP: 128/79  Pulse: 85  Resp: 20   Body mass index is 31.24 kg/(m^2).  General:  WDWN in NAD Gait: Normal HENT: WNL Eyes: PERRL Pulmonary: normal non-labored breathing , without Rales, rhonchi,  wheezing Cardiac: RRR Skin: no rashes, ulcers noted Vascular Exam/Pulses: good doppler signals in his bilateral DP/PT. Extremities without ischemic changes, no Gangrene , no cellulitis; no open wounds; incisions are well healed and there is no evidence of infection. Musculoskeletal: no muscle wasting or atrophy  Neurologic: A&O X 3; Appropriate Affect ; SENSATION: normal; MOTOR FUNCTION:  moving all extremities equally. Speech is fluent/normal  Non-Invasive Vascular Imaging: None today  ASSESSMENT: 71 y.o. male who is s/p  left below knee popliteal to posterior tibial artery bypass with non reversed translocated SVG from the left thigh on 10/01/09 by Dr. Edilia Bo.  He did have a head CT 3/1 that revealed no acute intracranial abnormality and stable old right frontal and temporal lobe infarcts. He did not have his 6 month duplex/ABI evaluation d/t  being in the hospital.  PLAN: We will have him return around the 13th of March for his graft surveillance and f/u appt with Dr. Edilia Bo.  The pt will call us if he has any problems before then.   Newton Pigg, PA-C [facility] [phone]  Clinic MD Edilia Bo

## 2011-06-17 ENCOUNTER — Emergency Department (HOSPITAL_COMMUNITY)
Admission: EM | Admit: 2011-06-17 | Discharge: 2011-06-17 | Disposition: A | Discharge status: Home / Self Care | Payer: Medicare Other | Attending: Emergency Medicine | Admitting: Emergency Medicine

## 2011-06-17 ENCOUNTER — Encounter (HOSPITAL_COMMUNITY): Payer: Self-pay | Admitting: Nurse Practitioner

## 2011-06-17 DIAGNOSIS — R51 Headache: Secondary | ICD-10-CM | POA: Insufficient documentation

## 2011-06-17 DIAGNOSIS — I10 Essential (primary) hypertension: Secondary | ICD-10-CM | POA: Insufficient documentation

## 2011-06-17 DIAGNOSIS — I2510 Atherosclerotic heart disease of native coronary artery without angina pectoris: Secondary | ICD-10-CM | POA: Insufficient documentation

## 2011-06-17 DIAGNOSIS — IMO0001 Reserved for inherently not codable concepts without codable children: Secondary | ICD-10-CM | POA: Insufficient documentation

## 2011-06-17 DIAGNOSIS — R52 Pain, unspecified: Secondary | ICD-10-CM

## 2011-06-17 DIAGNOSIS — I739 Peripheral vascular disease, unspecified: Secondary | ICD-10-CM | POA: Insufficient documentation

## 2011-06-17 DIAGNOSIS — K219 Gastro-esophageal reflux disease without esophagitis: Secondary | ICD-10-CM | POA: Insufficient documentation

## 2011-06-17 DIAGNOSIS — Z79899 Other long term (current) drug therapy: Secondary | ICD-10-CM | POA: Insufficient documentation

## 2011-06-17 DIAGNOSIS — E785 Hyperlipidemia, unspecified: Secondary | ICD-10-CM | POA: Insufficient documentation

## 2011-06-17 DIAGNOSIS — Z7982 Long term (current) use of aspirin: Secondary | ICD-10-CM | POA: Insufficient documentation

## 2011-06-17 LAB — COMPREHENSIVE METABOLIC PANEL
ALT: 20 U/L (ref 0–53)
AST: 15 U/L (ref 0–37)
Albumin: 3.9 g/dL (ref 3.5–5.2)
Alkaline Phosphatase: 74 U/L (ref 39–117)
BUN: 10 mg/dL (ref 6–23)
CO2: 27 mEq/L (ref 19–32)
Calcium: 10 mg/dL (ref 8.4–10.5)
Chloride: 101 mEq/L (ref 96–112)
Creatinine, Ser: 1.13 mg/dL (ref 0.50–1.35)
GFR calc Af Amer: 74 mL/min — ABNORMAL LOW (ref >90)
GFR calc non Af Amer: 64 mL/min — ABNORMAL LOW (ref >90)
Glucose, Bld: 99 mg/dL (ref 70–99)
Potassium: 4.1 mEq/L (ref 3.5–5.1)
Sodium: 138 mEq/L (ref 135–145)
Total Bilirubin: 0.4 mg/dL (ref 0.3–1.2)
Total Protein: 7.4 g/dL (ref 6.0–8.3)

## 2011-06-17 LAB — CBC
HCT: 39.3 % (ref 39.0–52.0)
Hemoglobin: 12.6 g/dL — ABNORMAL LOW (ref 13.0–17.0)
MCH: 22.5 pg — ABNORMAL LOW (ref 26.0–34.0)
MCHC: 32.1 g/dL (ref 30.0–36.0)
MCV: 70.2 fL — ABNORMAL LOW (ref 78.0–100.0)
Platelets: 169 10*3/uL (ref 150–400)
RBC: 5.6 MIL/uL (ref 4.22–5.81)
RDW: 16.5 % — ABNORMAL HIGH (ref 11.5–15.5)
WBC: 5.8 10*3/uL (ref 4.0–10.5)

## 2011-06-17 LAB — CK: Total CK: 210 U/L (ref 7–232)

## 2011-06-17 LAB — DIFFERENTIAL
Basophils Absolute: 0 10*3/uL (ref 0.0–0.1)
Basophils Relative: 1 % (ref 0–1)
Eosinophils Absolute: 0.1 10*3/uL (ref 0.0–0.7)
Eosinophils Relative: 2 % (ref 0–5)
Lymphocytes Relative: 26 % (ref 12–46)
Lymphs Abs: 1.5 10*3/uL (ref 0.7–4.0)
Monocytes Absolute: 0.3 10*3/uL (ref 0.1–1.0)
Monocytes Relative: 6 % (ref 3–12)
Neutro Abs: 3.8 10*3/uL (ref 1.7–7.7)
Neutrophils Relative %: 65 % (ref 43–77)

## 2011-06-17 NOTE — ED Provider Notes (Signed)
History     CSN: [id number]  Arrival date & time 06/17/11  1104   First MD Initiated Contact with Patient 06/17/11 1253      Chief Complaint  Patient presents with  . Generalized Body Aches    (Consider location/radiation/quality/duration/timing/severity/associated sxs/prior treatment) The history is provided by the patient.   patient here with bodyaches which began this morning. Localized to his arms with some slight headache. Denies any fever, URI symptoms. No urinary complaints of or rashes. Nothing makes his symptoms better or worse. Recently started on new medications consisting of Aggrenox. No no medications taken prior to arrival.  Past Medical History  Diagnosis Date  . GERD (gastroesophageal reflux disease)   . HLD (hyperlipidemia)   . HTN (hypertension)   . Tobacco abuse   . Cough secondary to angiotensin converting enzyme inhibitor (ACE-I)   . Rotator cuff injury   . Angina   . Peripheral vascular disease   . CAD (coronary artery disease)     A. Negative MV 04/2010;  B. Inadequate Stress Echo 05/2011;  C. 05/29/11 Cath: LM-NL, LAD-35m, LCX- 40p, OM1 small 159m, OM2 90, RCA 40-50p, 18m, EF 55-65% - Med Rx   . Iron deficiency anemia     Past Surgical History  Procedure Date  . Iliac artery aneurysm repair   . Circumcision   . Pr vein bypass graft,aorto-fem-pop 10/01/2009    left below knee popliteal artery to posterior tibial artery    Family History  Problem Relation Age of Onset  . Breast cancer Mother     deceased  . COPD Father     deceased    History  Substance Use Topics  . Smoking status: Former Smoker -- 1.0 packs/day for 55 years    Types: Cigarettes    Quit date: 05/26/2011  . Smokeless tobacco: Never Used  . Alcohol Use: No      Review of Systems  All other systems reviewed and are negative.    Allergies  Lisinopril  Home Medications   Current Outpatient Rx  Name Route Sig Dispense Refill  . ALBUTEROL SULFATE HFA 108 (90 BASE)  MCG/ACT IN AERS Inhalation Inhale 1-2 puffs into the lungs every 6 (six) hours as needed. For shortness of breath    . AMLODIPINE BESYLATE 5 MG PO TABS Oral Take 1 tablet (5 mg total) by mouth daily. 30 tablet 6  . ASPIRIN 325 MG PO TABS Oral Take 325 mg by mouth daily.    . ATENOLOL 50 MG PO TABS Oral Take by mouth daily.     . ASPIRIN-DIPYRIDAMOLE ER 25-200 MG PO CP12 Oral Take 1 capsule by mouth 2 (two) times daily. 60 capsule 2  . FERROUS SULFATE 325 (65 FE) MG PO TABS Oral Take 325 mg by mouth daily with breakfast.    . GABAPENTIN 300 MG PO CAPS Oral Take 300 mg by mouth daily as needed. For headaches    . GERITOL TONIC PO LIQD Oral Take 30 mLs by mouth daily.    Marland Kitchen LOSARTAN POTASSIUM-HCTZ 100-25 MG PO TABS Oral Take 1 tablet by mouth daily.      Marland Kitchen NICOTINE 21 MG/24HR TD PT24 Transdermal Place 1 patch onto the skin daily. 28 patch   . OMEPRAZOLE 20 MG PO CPDR Oral Take 20 mg by mouth 2 (two) times daily.      Marland Kitchen SIMVASTATIN 20 MG PO TABS Oral Take 20 mg by mouth at bedtime.     . TRAMADOL HCL 50 MG PO  TABS Oral Take 50 mg by mouth every 6 (six) hours as needed. For pain. Maximum dose= 8 tablets per day      BP 125/72  Pulse 58  Temp(Src) 97.7 F (36.5 C) (Oral)  Resp 16  Ht 5\' 11"  (1.803 m)  Wt 224 lb (101.606 kg)  BMI 31.24 kg/m2  SpO2 98%  Physical Exam  Nursing note and vitals reviewed. Constitutional: He is oriented to person, place, and time. Vital signs are normal. He appears well-developed and well-nourished.  Non-toxic appearance. No distress.  HENT:  Head: Normocephalic and atraumatic.  Eyes: Conjunctivae, EOM and lids are normal. Pupils are equal, round, and reactive to light.  Neck: Normal range of motion. Neck supple. No tracheal deviation present. No mass present.  Cardiovascular: Normal rate, regular rhythm and normal heart sounds.  Exam reveals no gallop.   No murmur heard. Pulmonary/Chest: Effort normal and breath sounds normal. No stridor. No respiratory  distress. He has no decreased breath sounds. He has no wheezes. He has no rhonchi. He has no rales.  Abdominal: Soft. Normal appearance and bowel sounds are normal. He exhibits no distension. There is no tenderness. There is no rebound and no CVA tenderness.  Musculoskeletal: Normal range of motion. He exhibits no edema and no tenderness.  Neurological: He is alert and oriented to person, place, and time. He has normal strength. No cranial nerve deficit or sensory deficit. GCS eye subscore is 4. GCS verbal subscore is 5. GCS motor subscore is 6.  Skin: Skin is warm and dry. No abrasion and no rash noted.  Psychiatric: He has a normal mood and affect. His speech is normal and behavior is normal.    ED Course  Procedures (including critical care time)   Labs Reviewed  CK  COMPREHENSIVE METABOLIC PANEL  CBC  DIFFERENTIAL   No results found.   No diagnosis found.    MDM  Blood work reviewed and no signs of muscle wasting. CBC and chemistries are normal. Patient be discharged and followup with his DR.        Toy Baker, MD 06/17/11 1444

## 2011-06-17 NOTE — ED Notes (Signed)
C/o headache and body aches onset this am. Denies any injuries or trauma. Ambulatory and MAE. States he thinks his new medication is too strong (amlodipine and aggrenox).

## 2011-06-22 ENCOUNTER — Encounter: Payer: Self-pay | Admitting: Physician Assistant

## 2011-06-22 ENCOUNTER — Ambulatory Visit (INDEPENDENT_AMBULATORY_CARE_PROVIDER_SITE_OTHER): Payer: Medicare Other | Admitting: Physician Assistant

## 2011-06-22 VITALS — BP 138/72 | HR 58 | Ht 71.0 in | Wt 226.0 lb

## 2011-06-22 DIAGNOSIS — E785 Hyperlipidemia, unspecified: Secondary | ICD-10-CM

## 2011-06-22 DIAGNOSIS — I10 Essential (primary) hypertension: Secondary | ICD-10-CM

## 2011-06-22 DIAGNOSIS — D509 Iron deficiency anemia, unspecified: Secondary | ICD-10-CM

## 2011-06-22 DIAGNOSIS — R0789 Other chest pain: Secondary | ICD-10-CM

## 2011-06-22 DIAGNOSIS — I2510 Atherosclerotic heart disease of native coronary artery without angina pectoris: Secondary | ICD-10-CM

## 2011-06-22 DIAGNOSIS — G459 Transient cerebral ischemic attack, unspecified: Secondary | ICD-10-CM

## 2011-06-22 NOTE — Progress Notes (Signed)
[address] [place], [place]  [address] Phone: [phone] Fax:  [phone]  Date:  06/22/2011   Name:  Joel [name]       DOB:  19-Apr-1940 MRN:  [id number]  PCP:  Dr. Nehemiah Settle Primary Cardiologist:  Dr. Arvilla Meres  Primary Electrophysiologist:  None    History of Present Illness: Joel [name] is a 71 y.o. male who presents for post hospital follow up.    He was evaluated by Dr. Gala Romney in 2010 for chest pain cardiac catheterization was arranged.  This was never performed.  He has a history of chronic chest pain as well as hypertension, hyperlipidemia, PAD, status post left popliteal to posterior tibial bypass in 6/11.  Prior Myoview 1/12 was negative.  He was admitted 2/16-2/20 with recurrent chest pain.  Myocardial infarction was ruled out.  Stress echocardiogram was attempted but he had submaximal exercise.  He was therefore set up for cardiac catheterization.  LHC 06/01/11: EF 55-65%, mid LAD 50%, diagonal 50-60%, proximal circumflex 40%, mid OM1 occluded, OM2 90%, proximal RCA 40-50%, mid RCA 70%.  The patient was noted to have microcytic anemia.  Dr. Riley Kill felt that the patient needed evaluation of this first.  His OM2 could be approached with stenting but with his anemia, dual antiplatelet therapy was felt to be potentially problematic.  He was then seen by Dr. Export Bing in the emergency room with recurrent chest pain as well as headache and left foot heaviness.  His isosorbide was discontinued and amlodipine was added for antianginal.  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  There is a question whether or not he had a TIA.  The patient was placed on Aggrenox by the emergency room physician.  Dr. Keith Bing recommended proceeding with a nuclear stress test to assess for ischemia to help guide therapy (PCI vs med rx).    He notes his chest pain is improved.  He  notes it occasionally at rest.  Notes he has chronic shoulder pain from rotator cuff issues.  Denies exertional chest pain.  No DOE.  No orthopnea, PND or edema.  No syncope.  He is taking all of his medications.   Past Medical History  Diagnosis Date  . GERD (gastroesophageal reflux disease)   . HLD (hyperlipidemia)   . HTN (hypertension)   . Tobacco abuse   . Cough secondary to angiotensin converting enzyme inhibitor (ACE-I)   . Rotator cuff injury   . Angina   . Peripheral vascular disease   . CAD (coronary artery disease)     A. Negative MV 04/2010;  B. Inadequate Stress Echo 05/2011;  C. 05/29/11 Cath: LM-NL, LAD-11m, LCX- 40p, OM1 small 164m, OM2 90, RCA 40-50p, 30m, EF 55-65% - Med Rx   . Iron deficiency anemia     Current Outpatient Prescriptions  Medication Sig Dispense Refill  . albuterol (PROVENTIL HFA;VENTOLIN HFA) 108 (90 BASE) MCG/ACT inhaler Inhale 1-2 puffs into the lungs every 6 (six) hours as needed. For shortness of breath      . amLODipine (NORVASC) 5 MG tablet Take 1 tablet (5 mg total) by mouth daily.  30 tablet  6  . aspirin 81 MG tablet Take 81 mg by mouth 4 (four) times daily.      Marland Kitchen atenolol (TENORMIN) 50 MG tablet Take by mouth daily.       Marland Kitchen dipyridamole-aspirin (AGGRENOX) 25-200 MG per  12 hr capsule Take 1 capsule by mouth 2 (two) times daily.  60 capsule  2  . ferrous sulfate 325 (65 FE) MG tablet Take 325 mg by mouth daily with breakfast.      . gabapentin (NEURONTIN) 300 MG capsule Take 300 mg by mouth daily as needed. For headaches      . losartan-hydrochlorothiazide (HYZAAR) 100-25 MG per tablet Take 1 tablet by mouth daily.        . nicotine (NICODERM CQ - DOSED IN MG/24 HOURS) 21 mg/24hr patch Place 1 patch onto the skin daily.  28 patch    . omeprazole (PRILOSEC) 20 MG capsule Take 20 mg by mouth daily.       . simvastatin (ZOCOR) 20 MG tablet Take 20 mg by mouth at bedtime.       . traMADol (ULTRAM) 50 MG tablet Take 50 mg by mouth every 6 (six) hours  as needed. For pain. Maximum dose= 8 tablets per day        Allergies: Allergies  Allergen Reactions  . Imdur (Isosorbide Mononitrate)   . Lisinopril     REACTION: Cough    History  Substance Use Topics  . Smoking status: Former Smoker -- 1.0 packs/day for 55 years    Types: Cigarettes    Quit date: 05/26/2011  . Smokeless tobacco: Never Used  . Alcohol Use: No     ROS:  Please see the history of present illness.   All other systems reviewed and negative.   PHYSICAL EXAM: VS:  BP 138/72  Pulse 58  Ht 5\' 11"  (1.803 m)  Wt 226 lb (102.513 kg)  BMI 31.52 kg/m2 Well nourished, well developed, in no acute distress HEENT: normal Neck: no JVD Cardiac:  normal S1, S2; RRR; no murmur Lungs:  clear to auscultation bilaterally, no wheezing, rhonchi or rales Abd: soft, nontender, no hepatomegaly Ext: no edema; right wrist without hematoma or bruit Skin: warm and dry Neuro:  CNs 2-12 intact, no focal abnormalities noted  EKG:  Sinus bradycardia, heart rate 58, normal axis, nonspecific ST-T wave changes, LVH  ASSESSMENT AND PLAN:  1. CAD (coronary artery disease)  Chest pain has been atypical.  He seems to be doing well with current medical Rx.  We had a long discussion regarding reasoning for obtaining a Myoview and how it would held guide therapy.  He would like to pursue Myoview at this time.  Continue current Rx.  Dr.  [name] did his cath and I think it would be appropriate for him to follow up with the patient to review.  Follow up with Dr.  [name] after his stress test to make further recommendations, if any.   2. Chest pain, atypical  Not clearly anginal.  He has disease in the OM2 that could be tx with PCI.  Proceed with Lexiscan Myoview as noted.   3. HYPERTENSION  Controlled.  Continue current therapy.    4. HYPERLIPIDEMIA  Managed by PCP.  Goal LDL 70 or less.  Max dose of simvastatin 20 mg with concomitant use of amlodipine.   5. TIA  Continue  aggrenox and follow up with PCP.   6. Microcytic anemia  He is not sure when his last colonoscopy.  Ongoing workup of anemia per his PCP.  Of note, MCV in 2008 was 72.  Fe and Ferritin were ok in the hospital.  Question if he has a thalasemia trait or sickle trait.  Wonder if hgb electrophoresis would be helpful.  Defer to PCP.     Luna Glasgow, PA-C  12:33 PM 06/22/2011

## 2011-06-22 NOTE — Patient Instructions (Signed)
Your physician has requested that you have a lexiscan myoview DX 786.50 CHEST PAIN, CAD 414.01. For further information please visit [email or web address] Please follow instruction sheet, as given.  Your physician recommends that you schedule a follow-up appointment in: 4 WEEKS WITH DR. Riley Kill

## 2011-07-05 ENCOUNTER — Ambulatory Visit (HOSPITAL_COMMUNITY): Payer: Medicare Other | Attending: Physician Assistant | Admitting: Radiology

## 2011-07-05 VITALS — BP 141/83 | Ht 71.0 in | Wt 226.0 lb

## 2011-07-05 DIAGNOSIS — I739 Peripheral vascular disease, unspecified: Secondary | ICD-10-CM | POA: Insufficient documentation

## 2011-07-05 DIAGNOSIS — E785 Hyperlipidemia, unspecified: Secondary | ICD-10-CM | POA: Insufficient documentation

## 2011-07-05 DIAGNOSIS — Z8673 Personal history of transient ischemic attack (TIA), and cerebral infarction without residual deficits: Secondary | ICD-10-CM | POA: Insufficient documentation

## 2011-07-05 DIAGNOSIS — R0789 Other chest pain: Secondary | ICD-10-CM

## 2011-07-05 DIAGNOSIS — R0609 Other forms of dyspnea: Secondary | ICD-10-CM | POA: Insufficient documentation

## 2011-07-05 DIAGNOSIS — R0989 Other specified symptoms and signs involving the circulatory and respiratory systems: Secondary | ICD-10-CM | POA: Insufficient documentation

## 2011-07-05 DIAGNOSIS — I2510 Atherosclerotic heart disease of native coronary artery without angina pectoris: Secondary | ICD-10-CM | POA: Insufficient documentation

## 2011-07-05 DIAGNOSIS — R079 Chest pain, unspecified: Secondary | ICD-10-CM | POA: Insufficient documentation

## 2011-07-05 DIAGNOSIS — R0602 Shortness of breath: Secondary | ICD-10-CM | POA: Insufficient documentation

## 2011-07-05 DIAGNOSIS — I10 Essential (primary) hypertension: Secondary | ICD-10-CM | POA: Insufficient documentation

## 2011-07-05 DIAGNOSIS — R42 Dizziness and giddiness: Secondary | ICD-10-CM | POA: Insufficient documentation

## 2011-07-05 MED ORDER — REGADENOSON 0.4 MG/5ML IV SOLN
0.4000 mg | Freq: Once | INTRAVENOUS | Status: AC
  Administered 2011-07-05: 0.4 mg via INTRAVENOUS

## 2011-07-05 MED ORDER — TECHNETIUM TC 99M TETROFOSMIN IV KIT
30.0000 | PACK | Freq: Once | INTRAVENOUS | Status: AC | PRN
  Administered 2011-07-05: 30 via INTRAVENOUS

## 2011-07-05 MED ORDER — TECHNETIUM TC 99M TETROFOSMIN IV KIT
10.0000 | PACK | Freq: Once | INTRAVENOUS | Status: AC | PRN
  Administered 2011-07-05: 10 via INTRAVENOUS

## 2011-07-05 NOTE — Progress Notes (Signed)
[facility] SITE 3 NUCLEAR MED [address] [place] [address] [phone]  Cardiology Nuclear Med Study  Joel [name] is a 71 y.o. male     MRN : [id number]     DOB: 04/04/1941  Procedure Date: 07/05/2011  Nuclear Med Background Indication for Stress Test:  Evaluation for Ischemia and 06/02/11 Post Hospital:Chest pain, (-) enzymes History: 6/09 MPS: (-) EF: 51%, 6/11: Illiac Aneurysm Repair. 05/29/11 Heart Cath: Diffuse CAD Pt refused repeat cath for stent, 05/31/11 ECHO:EF: 60-65% mild AR, Stress ECHO: Non Dx   Cardiac Risk Factors: Hypertension, Lipids, PVD and TIA  Symptoms:  Chest Pain, Dizziness, DOE, Light-Headedness and SOB   Nuclear Pre-Procedure Caffeine/Decaff Intake:  None NPO After: 9:00pm   Lungs:  clear O2 Sat: 99% on room air. IV 0.9% NS with Angio Cath:  20g  IV Site: R Antecubital  IV Started by:  Stanton Kidney, EMT-P  Chest Size (in):  46 Cup Size: n/a  Height: 5\' 11"  (1.803 m)  Weight:  226 lb (102.513 kg)  BMI:  Body mass index is 31.52 kg/(m^2). Tech Comments:  Aggrenox held > 72 hours, per patient.    Nuclear Med Study 1 or 2 day study: 1 day  Stress Test Type:  Eugenie Birks  Reading MD: Dietrich Pates, MD  Order Authorizing Provider:  T.Stuckey/ S.Weaver PA  Resting Radionuclide: Technetium 31m Tetrofosmin  Resting Radionuclide Dose: 10.9 mCi   Stress Radionuclide:  Technetium 58m Tetrofosmin  Stress Radionuclide Dose: 32.8 mCi           Stress Protocol Rest HR: 47 Stress HR: 58  Rest BP: 141/83 Stress BP: 144/98  Exercise Time (min): n/a METS: n/a   Predicted Max HR: 149 bpm % Max HR: 38.93 bpm Rate Pressure Product: 8352   Dose of Adenosine (mg):  n/a Dose of Lexiscan: 0.4 mg  Dose of Atropine (mg): n/a Dose of Dobutamine: n/a mcg/kg/min (at max HR)  Stress Test Technologist: Milana Na, EMT-P  Nuclear Technologist:  Domenic Polite, CNMT     Rest Procedure:  Myocardial perfusion imaging was performed at rest 45  minutes following the intravenous administration of Technetium 68m Tetrofosmin. Rest ECG: Sinus Bradycardia  Stress Procedure:  The patient received IV Lexiscan 0.4 mg over 15-seconds.  Technetium 92m Tetrofosmin injected at 30-seconds.  There were no significant changes, a weird feeling, and sob with Lexiscan.  Quantitative spect images were obtained after a 45 minute delay. Stress ECG: No significant change from baseline ECG  QPS Raw Data Images:  Images were motion corrected.  Soft tissue (diaphragm, bowel activity) underlies inferior wall. Stress Images:  Normal homogeneous uptake in all areas of the myocardium. Rest Images:  Normal homogeneous uptake in all areas of the myocardium. Subtraction (SDS):  No significant ischemia. Transient Ischemic Dilatation (Normal <1.22):  1.09 Lung/Heart Ratio (Normal <0.45):  .22  Quantitative Gated Spect Images QGS EDV:  123 ml QGS ESV:  58 ml  Impression Exercise Capacity:  Lexiscan with low level exercise. BP Response:  Normal blood pressure response. Clinical Symptoms:  No significant symptoms noted. ECG Impression:  No significant ST segment change suggestive of ischemia. Comparison with Prior Nuclear Study: No change from previous report.  Overall Impression:  Normal stress nuclear study.  LV Ejection Fraction: 53%.  LV Wall Motion:  NL LV Function; NL Wall Motion  Visual estimate appears greater than calculated 53%.

## 2011-07-06 ENCOUNTER — Telehealth: Payer: Self-pay | Admitting: *Deleted

## 2011-07-06 NOTE — Telephone Encounter (Signed)
pt notified of stress test results and gave verbal understanding. Danielle Rankin

## 2011-07-06 NOTE — Telephone Encounter (Signed)
Message copied by Tarri Fuller on Tue Jul 06, 2011  9:41 AM ------      Message from: [place], [place] T      Created: Tue Jul 06, 2011  9:36 AM       Please inform patient stress test normal.      Tereso Newcomer, PA-C  9:36 AM 07/06/2011

## 2011-07-27 ENCOUNTER — Encounter: Payer: Self-pay | Admitting: Neurosurgery

## 2011-07-28 ENCOUNTER — Encounter: Payer: Self-pay | Admitting: Neurosurgery

## 2011-07-28 ENCOUNTER — Ambulatory Visit (INDEPENDENT_AMBULATORY_CARE_PROVIDER_SITE_OTHER): Payer: Medicare Other | Admitting: Neurosurgery

## 2011-07-28 ENCOUNTER — Encounter (INDEPENDENT_AMBULATORY_CARE_PROVIDER_SITE_OTHER): Payer: Medicare Other | Admitting: *Deleted

## 2011-07-28 ENCOUNTER — Ambulatory Visit (INDEPENDENT_AMBULATORY_CARE_PROVIDER_SITE_OTHER): Payer: Medicare Other | Admitting: *Deleted

## 2011-07-28 VITALS — BP 131/73 | HR 47 | Resp 16 | Ht 71.0 in | Wt 228.7 lb

## 2011-07-28 DIAGNOSIS — Z48812 Encounter for surgical aftercare following surgery on the circulatory system: Secondary | ICD-10-CM

## 2011-07-28 DIAGNOSIS — I739 Peripheral vascular disease, unspecified: Secondary | ICD-10-CM

## 2011-07-28 DIAGNOSIS — I70219 Atherosclerosis of native arteries of extremities with intermittent claudication, unspecified extremity: Secondary | ICD-10-CM

## 2011-07-28 NOTE — Progress Notes (Signed)
[facility] HISTORY AND PHYSICAL   CC: Annual lower show an ABI and evaluation of left lower extremity bypass graft Referring Physician: Edilia Bo  History of Present Illness: 71 year old male patient of Dr. Edilia Bo seen today for lower extremity ABIs as well as lower extremity bypass graft evaluation to patient underwent a left below the knee popliteal artery to posterior tibial artery bypass with Dr. Edilia Bo in June 2011. Patient reports no problems with ambulation, he has no claudication. He reports no pain in his lower extremities with rest or activity. The patient does report having have had severe headaches recently and is trying to contact his primary care to get this evaluated. He does not report any new vascular problems.  Past Medical History  Diagnosis Date  . GERD (gastroesophageal reflux disease)   . HLD (hyperlipidemia)   . HTN (hypertension)   . Tobacco abuse   . Cough secondary to angiotensin converting enzyme inhibitor (ACE-I)   . Rotator cuff injury   . Angina   . Peripheral vascular disease   . CAD (coronary artery disease)     A. Negative MV 04/2010;  B. Inadequate Stress Echo 05/2011;  C. 05/29/11 Cath: LM-NL, LAD-43m, LCX- 40p, OM1 small 162m, OM2 90, RCA 40-50p, 72m, EF 55-65% - Med Rx   . Iron deficiency anemia     ROS: [x]  Positive   [ ]  Denies    General: [ ]  Weight loss, [ ]  Fever, [ ]  chills Neurologic: [ ]  Dizziness, [ ]  Blackouts, [ ]  Seizure [ ]  Stroke, [ ]  "Mini stroke", [ ]  Slurred speech, [ ]  Temporary blindness; [ ]  weakness in arms or legs, [ ]  Hoarseness Cardiac: [ ]  Chest pain/pressure, [ ]  Shortness of breath at rest [ ]  Shortness of breath with exertion, [ ]  Atrial fibrillation or irregular heartbeat Vascular: [ ]  Pain in legs with walking, [ ]  Pain in legs at rest, [ ]  Pain in legs at night,  [ ]  Non-healing ulcer, [ ]  Blood clot in vein/DVT,   Pulmonary: [ ]  Home oxygen, [ ]  Productive cough, [ ]  Coughing up blood, [ ]   Asthma,  [ ]  Wheezing Musculoskeletal:  [ ]  Arthritis, [ ]  Low back pain, [ ]  Joint pain Hematologic: [ ]  Easy Bruising, [ ]  Anemia; [ ]  Hepatitis Gastrointestinal: [ ]  Blood in stool, [ ]  Gastroesophageal Reflux/heartburn, [ ]  Trouble swallowing Urinary: [ ]  chronic Kidney disease, [ ]  on HD - [ ]  MWF or [ ]  TTHS, [ ]  Burning with urination, [ ]  Difficulty urinating Skin: [ ]  Rashes, [ ]  Wounds Psychological: [ ]  Anxiety, [ ]  Depression   Social History History  Substance Use Topics  . Smoking status: Former Smoker -- 1.0 packs/day for 55 years    Types: Cigarettes    Quit date: 05/26/2011  . Smokeless tobacco: Never Used  . Alcohol Use: No    Family History Family History  Problem Relation Age of Onset  . Breast cancer Mother     deceased  . Cancer Mother   . Hyperlipidemia Mother   . Hypertension Mother   . COPD Father     deceased  . Cancer Father   . Hyperlipidemia Father   . Hypertension Father   . Hypertension Sister   . Cancer Brother   . Hypertension Brother   . Hypertension Daughter   . Hypertension Son     Allergies  Allergen Reactions  . Imdur (Isosorbide Mononitrate) Nausea Only  Headache  . Lisinopril     REACTION: Cough    Current Outpatient Prescriptions  Medication Sig Dispense Refill  . albuterol (PROVENTIL HFA;VENTOLIN HFA) 108 (90 BASE) MCG/ACT inhaler Inhale 1-2 puffs into the lungs every 6 (six) hours as needed. For shortness of breath       . amLODipine (NORVASC) 5 MG tablet Take 1 tablet (5 mg total) by mouth daily.  30 tablet  6  . aspirin 81 MG tablet Take 81 mg by mouth 4 (four) times daily.      Marland Kitchen atenolol (TENORMIN) 50 MG tablet Take by mouth daily.       Marland Kitchen dipyridamole-aspirin (AGGRENOX) 25-200 MG per 12 hr capsule Take 1 capsule by mouth 2 (two) times daily.  60 capsule  2  . ferrous sulfate 325 (65 FE) MG tablet Take 325 mg by mouth daily with breakfast.      . gabapentin (NEURONTIN) 300 MG capsule Take 300 mg by mouth daily as  needed. For headaches      . losartan-hydrochlorothiazide (HYZAAR) 100-25 MG per tablet Take 1 tablet by mouth daily.        Marland Kitchen omeprazole (PRILOSEC) 20 MG capsule Take 20 mg by mouth daily.       . simvastatin (ZOCOR) 20 MG tablet Take 20 mg by mouth at bedtime.       . traMADol (ULTRAM) 50 MG tablet Take 50 mg by mouth every 6 (six) hours as needed. For pain. Maximum dose= 8 tablets per day        Physical Examination  Filed Vitals:   07/28/11 1515  BP: 131/73  Pulse: 47  Resp: 16    Body mass index is 31.90 kg/(m^2).  General:  WDWN in NAD Gait: Normal HEENT: WNL Eyes: Pupils equal Pulmonary: normal non-labored breathing , without Rales, rhonchi,  wheezing Cardiac: RRR, without  Murmurs, rubs or gallops; No carotid bruits Abdomen: soft, NT, no masses Skin: no rashes, ulcers noted Vascular Exam/Pulses: Patient has 2+ DP and PT pulses bilaterally, Doppler was used. He has 2+ radial pulses bilaterally I do not feel a popliteal pulse  Extremities without ischemic changes, no Gangrene , no cellulitis; no open wounds;  Musculoskeletal: no muscle wasting or atrophy  Neurologic: A&O X 3; Appropriate Affect ; SENSATION: normal; MOTOR FUNCTION:  moving all extremities equally. Speech is fluent/normal  Non-Invasive Vascular Imaging: Or extremity ABIs today show a monophasic flow in the right triphasic flow left, with an ABI of 0.88 on the right and 1.22 on the left. Previous ABI on August of 2012 was 0.82 on the right and 1.05 on the left her normal velocities on the duplex bypass graft scan  ASSESSMENT/PLAN: Assessment as above, plan the patient will followup. One year for repeat duplex bypass graft and ABIs, his questions were encouraged and answered. He will followup with his primary care or urgent care regarding his headaches.  Lauree Chandler ANP  Clinic M.D.: Edilia Bo

## 2011-07-30 ENCOUNTER — Ambulatory Visit: Payer: Medicare Other | Admitting: Cardiology

## 2011-08-05 ENCOUNTER — Encounter: Payer: Self-pay | Admitting: Cardiology

## 2011-08-05 ENCOUNTER — Ambulatory Visit (INDEPENDENT_AMBULATORY_CARE_PROVIDER_SITE_OTHER): Payer: Medicare Other | Admitting: Cardiology

## 2011-08-05 VITALS — BP 140/78 | HR 52 | Ht 71.0 in | Wt 231.4 lb

## 2011-08-05 DIAGNOSIS — D509 Iron deficiency anemia, unspecified: Secondary | ICD-10-CM

## 2011-08-05 DIAGNOSIS — F17200 Nicotine dependence, unspecified, uncomplicated: Secondary | ICD-10-CM

## 2011-08-05 DIAGNOSIS — I10 Essential (primary) hypertension: Secondary | ICD-10-CM

## 2011-08-05 DIAGNOSIS — I2510 Atherosclerotic heart disease of native coronary artery without angina pectoris: Secondary | ICD-10-CM

## 2011-08-05 NOTE — Patient Instructions (Signed)
Your physician recommends that you schedule a follow-up appointment in: 3-4 MONTHS  Your physician recommends that you continue on your current medications as directed. Please refer to the Current Medication list given to you today.  

## 2011-08-08 NOTE — Progress Notes (Signed)
HPI:  Patient returns in follow up.  He was admitted by Dr. Nehemiah Settle with chest pain.  He underwent cardiac cath and we discussed options.  I reviewed with him the following days the possible approach, but he opted for medical therapy.  My note at the time of his discharge was as follows:  I reviewed with patient in detail. I spent more than thirty minutes yesterday explaining all the potential treatment options. He does not want a stent at this time, and prefers to be treated medically after all of the relevant issues were reviewed. He understands the elements of medical therapy which I feel to be critical, including compliance, avoidance of excess stress, and discontinuation of smoking. Would add long acting nitrates. It is still not clear that his pain is related to cardiac issues, but certainly could be.  He has been seen back in follow up, and underwent radionuclide imaging.  Specifically, this demonstrated the following in March   Impression  Exercise Capacity: Lexiscan with low level exercise.  BP Response: Normal blood pressure response.  Clinical Symptoms: No significant symptoms noted.  ECG Impression: No significant ST segment change suggestive of ischemia.  Comparison with Prior Nuclear Study: No change from previous report.  Overall Impression: Normal stress nuclear study.  LV Ejection Fraction: 53%. LV Wall Motion: NL LV Function; NL Wall Motion Visual estimate appears greater than calculated 53%.    He still uses inhalers.  He has not been having much in the way of chest pain recently.     Current Outpatient Prescriptions  Medication Sig Dispense Refill  . albuterol (PROVENTIL HFA;VENTOLIN HFA) 108 (90 BASE) MCG/ACT inhaler Inhale 1-2 puffs into the lungs every 6 (six) hours as needed. For shortness of breath       . amLODipine (NORVASC) 5 MG tablet Take 1 tablet (5 mg total) by mouth daily.  30 tablet  6  . aspirin 81 MG tablet Take 81 mg by mouth 4 (four) times daily.        Marland Kitchen atenolol (TENORMIN) 50 MG tablet Take by mouth daily.       Marland Kitchen dipyridamole-aspirin (AGGRENOX) 25-200 MG per 12 hr capsule Take 1 capsule by mouth 2 (two) times daily.  60 capsule  2  . ferrous sulfate 325 (65 FE) MG tablet Take 325 mg by mouth daily with breakfast.      . gabapentin (NEURONTIN) 300 MG capsule Take 300 mg by mouth daily as needed. For headaches      . losartan-hydrochlorothiazide (HYZAAR) 100-25 MG per tablet Take 1 tablet by mouth daily.        Marland Kitchen omeprazole (PRILOSEC) 20 MG capsule Take 20 mg by mouth daily.       . simvastatin (ZOCOR) 20 MG tablet Take 20 mg by mouth at bedtime.       . traMADol (ULTRAM) 50 MG tablet Take 50 mg by mouth every 6 (six) hours as needed. For pain. Maximum dose= 8 tablets per day        Allergies  Allergen Reactions  . Hydrocodone     Shortness of breath and leg cramps  . Imdur (Isosorbide Mononitrate) Nausea Only    Headache  . Lisinopril     REACTION: Cough  . Topiramate     Shortness of breath and leg cramps    Past Medical History  Diagnosis Date  . GERD (gastroesophageal reflux disease)   . HLD (hyperlipidemia)   . HTN (hypertension)   . Tobacco abuse   .  Cough secondary to angiotensin converting enzyme inhibitor (ACE-I)   . Rotator cuff injury   . Angina   . Peripheral vascular disease   . CAD (coronary artery disease)     A. Negative MV 04/2010;  B. Inadequate Stress Echo 05/2011;  C. 05/29/11 Cath: LM-NL, LAD-49m, LCX- 40p, OM1 small 176m, OM2 90, RCA 40-50p, 22m, EF 55-65% - Med Rx   . Iron deficiency anemia     Past Surgical History  Procedure Date  . Iliac artery aneurysm repair   . Circumcision   . Pr vein bypass graft,aorto-fem-pop 10/01/2009    left below knee popliteal artery to posterior tibial artery    Family History  Problem Relation Age of Onset  . Breast cancer Mother     deceased  . Cancer Mother   . Hyperlipidemia Mother   . Hypertension Mother   . COPD Father     deceased  . Cancer Father   .  Hyperlipidemia Father   . Hypertension Father   . Hypertension Sister   . Cancer Brother   . Hypertension Brother   . Hypertension Daughter   . Hypertension Son     History   Social History  . Marital Status: Widowed    Spouse Name: N/A    Number of Children: N/A  . Years of Education: N/A   Occupational History  . Not on file.   Social History Main Topics  . Smoking status: Former Smoker -- 1.0 packs/day for 55 years    Types: Cigarettes    Quit date: 05/26/2011  . Smokeless tobacco: Never Used  . Alcohol Use: No  . Drug Use: No  . Sexually Active: Not on file   Other Topics Concern  . Not on file   Social History Narrative   Single. Retired.     ROS: Please see the HPI.  All other systems reviewed and negative.  PHYSICAL EXAM:  BP 140/78  Pulse 52  Ht 5\' 11"  (1.803 m)  Wt 231 lb 6.4 oz (104.962 kg)  BMI 32.27 kg/m2  General: Well developed, well nourished, in no acute distress. Head:  Normocephalic and atraumatic. Neck: no JVD Lungs: prolonged expiration.  No wheezes.   Heart: Normal S1 and S2.  No murmur, rubs or gallops.  Abdomen:  Normal bowel sounds; soft; non tender; no organomegaly Pulses: Pulses normal in all 4 extremities. Extremities: No clubbing or cyanosis. No edema. Neurologic: Alert and oriented x 3.  EKG:  SB.  FIrst degree av block  (PR 210).  No acute changes.  CATH STUDY  05/2011  Left mainstem: No significant obstruction  Left anterior descending (LAD): Large and long with significant ectasia. 50% mid stenosis that is eccentric with involvement of small diagonal with 50-60% lesion. Distal LAD wraps the apex and supplies the distal inferior wall.  Left circumflex (LCx): large caliber. Ectasia with small ramus. 40% proximal vessel OM 1 is small, and severely diseased, totally occluded in mid. OM2 has eccentric 90% lesion in moderate size OM2 and collateralizes OM1.  Right coronary artery (RCA): Marked ectasia and large caliber vessel.  40-50% proximal lesion, 70% mid vessel narrowing at acute margin. Diffuse ectasia in distal vessel. Diffuse plaquing of small PDA. Small PLA  Left ventriculography: Left ventricular systolic function is normal, LVEF is estimated at 55-65%, there is trace angiographic mitral regurgitation  Final Conclusions:  1. Preserved LV function  2. Diffuse ectasia  3. Diffuse disease of the RCA  4. Total occlusion of the OM1  5. Significant disease of the OM2  Recommendations:  1. Patient has microcytic anemia with MCV 70, and needs evaluation of this. Options include initial medical therapy with sig risk factor reduction, and antiplatelet therapy if stools negative. OM2 could be stented, but there is somewhat diffuse plaque in multiple vessels. If chest pain continues, the PCI could be done with the caveat that DAPT may be problematic. Will review with Dr. Nehemiah Settle.  [name]  06/01/2011, 9:13 AM    ASSESSMENT AND PLAN:

## 2011-08-11 NOTE — Procedures (Unsigned)
BYPASS GRAFT EVALUATION  INDICATION:  Left below-knee popliteal to posterior tibial arterial graft.  HISTORY: Diabetes:  No. Cardiac: Hypertension:  Yes. Smoking:  Previous. 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  SINGLE LEVEL ARTERIAL EXAM                              RIGHT              LEFT Brachial: Anterior tibial: Posterior tibial: Peroneal: Ankle/brachial index:        0.88               1.22  PREVIOUS ABI:  Date: 11/12/10  RIGHT:  0.82  LEFT:  1.05  LOWER EXTREMITY BYPASS GRAFT DUPLEX EXAM:  DUPLEX: 1. Widely patent left popliteal to tibial graft with elevated     velocities and disease present in the native popliteal inflow     artery. 2. Please see attached worksheet for velocities and details.  IMPRESSION:  Widely patent left popliteal to tibial graft with inflow disease, as described above.  ___________________________________________ Di Kindle. Edilia Bo, M.D.  LT/MEDQ  D:  07/28/2011  T:  07/28/2011  Job:  [id number]

## 2011-08-14 ENCOUNTER — Emergency Department (HOSPITAL_COMMUNITY)
Admission: EM | Admit: 2011-08-14 | Discharge: 2011-08-15 | Disposition: A | Discharge status: Home / Self Care | Payer: Medicare Other | Attending: Emergency Medicine | Admitting: Emergency Medicine

## 2011-08-14 ENCOUNTER — Emergency Department (HOSPITAL_COMMUNITY)
Admission: EM | Admit: 2011-08-14 | Discharge: 2011-08-14 | Disposition: A | Discharge status: Nursing Home (Includes ICF) | Payer: Medicare Other | Source: Home / Self Care | Attending: Emergency Medicine | Admitting: Emergency Medicine

## 2011-08-14 ENCOUNTER — Emergency Department (HOSPITAL_COMMUNITY): Payer: Medicare Other

## 2011-08-14 ENCOUNTER — Encounter (HOSPITAL_COMMUNITY): Payer: Self-pay | Admitting: Emergency Medicine

## 2011-08-14 ENCOUNTER — Encounter (HOSPITAL_COMMUNITY): Payer: Self-pay | Admitting: *Deleted

## 2011-08-14 DIAGNOSIS — Z9889 Other specified postprocedural states: Secondary | ICD-10-CM | POA: Insufficient documentation

## 2011-08-14 DIAGNOSIS — R062 Wheezing: Secondary | ICD-10-CM | POA: Insufficient documentation

## 2011-08-14 DIAGNOSIS — E785 Hyperlipidemia, unspecified: Secondary | ICD-10-CM | POA: Insufficient documentation

## 2011-08-14 DIAGNOSIS — I739 Peripheral vascular disease, unspecified: Secondary | ICD-10-CM | POA: Insufficient documentation

## 2011-08-14 DIAGNOSIS — R0600 Dyspnea, unspecified: Secondary | ICD-10-CM

## 2011-08-14 DIAGNOSIS — R0989 Other specified symptoms and signs involving the circulatory and respiratory systems: Secondary | ICD-10-CM

## 2011-08-14 DIAGNOSIS — R0789 Other chest pain: Secondary | ICD-10-CM | POA: Insufficient documentation

## 2011-08-14 DIAGNOSIS — Z7982 Long term (current) use of aspirin: Secondary | ICD-10-CM | POA: Insufficient documentation

## 2011-08-14 DIAGNOSIS — K219 Gastro-esophageal reflux disease without esophagitis: Secondary | ICD-10-CM | POA: Insufficient documentation

## 2011-08-14 DIAGNOSIS — F17200 Nicotine dependence, unspecified, uncomplicated: Secondary | ICD-10-CM | POA: Insufficient documentation

## 2011-08-14 DIAGNOSIS — R0609 Other forms of dyspnea: Secondary | ICD-10-CM

## 2011-08-14 DIAGNOSIS — R079 Chest pain, unspecified: Secondary | ICD-10-CM

## 2011-08-14 DIAGNOSIS — R0602 Shortness of breath: Secondary | ICD-10-CM | POA: Insufficient documentation

## 2011-08-14 DIAGNOSIS — I10 Essential (primary) hypertension: Secondary | ICD-10-CM | POA: Insufficient documentation

## 2011-08-14 DIAGNOSIS — I2510 Atherosclerotic heart disease of native coronary artery without angina pectoris: Secondary | ICD-10-CM | POA: Insufficient documentation

## 2011-08-14 LAB — CBC
HCT: 35.5 % — ABNORMAL LOW (ref 39.0–52.0)
Hemoglobin: 11.5 g/dL — ABNORMAL LOW (ref 13.0–17.0)
MCH: 22.9 pg — ABNORMAL LOW (ref 26.0–34.0)
MCHC: 32.4 g/dL (ref 30.0–36.0)
MCV: 70.6 fL — ABNORMAL LOW (ref 78.0–100.0)
Platelets: 154 10*3/uL (ref 150–400)
RBC: 5.03 MIL/uL (ref 4.22–5.81)
RDW: 17.5 % — ABNORMAL HIGH (ref 11.5–15.5)
WBC: 4 10*3/uL (ref 4.0–10.5)

## 2011-08-14 LAB — DIFFERENTIAL
Basophils Absolute: 0 10*3/uL (ref 0.0–0.1)
Basophils Relative: 1 % (ref 0–1)
Eosinophils Absolute: 0.2 10*3/uL (ref 0.0–0.7)
Eosinophils Relative: 5 % (ref 0–5)
Lymphocytes Relative: 38 % (ref 12–46)
Lymphs Abs: 1.5 10*3/uL (ref 0.7–4.0)
Monocytes Absolute: 0.3 10*3/uL (ref 0.1–1.0)
Monocytes Relative: 7 % (ref 3–12)
Neutro Abs: 2 10*3/uL (ref 1.7–7.7)
Neutrophils Relative %: 49 % (ref 43–77)

## 2011-08-14 LAB — URINALYSIS, ROUTINE W REFLEX MICROSCOPIC
Bilirubin Urine: NEGATIVE
Glucose, UA: NEGATIVE mg/dL
Hgb urine dipstick: NEGATIVE
Ketones, ur: NEGATIVE mg/dL
Nitrite: NEGATIVE
Protein, ur: NEGATIVE mg/dL
Specific Gravity, Urine: 1.007 (ref 1.005–1.030)
Urobilinogen, UA: 0.2 mg/dL (ref 0.0–1.0)
pH: 5.5 (ref 5.0–8.0)

## 2011-08-14 LAB — URINE MICROSCOPIC-ADD ON

## 2011-08-14 LAB — BASIC METABOLIC PANEL
BUN: 10 mg/dL (ref 6–23)
CO2: 27 mEq/L (ref 19–32)
Calcium: 9.4 mg/dL (ref 8.4–10.5)
Chloride: 103 mEq/L (ref 96–112)
Creatinine, Ser: 1.27 mg/dL (ref 0.50–1.35)
GFR calc Af Amer: 64 mL/min — ABNORMAL LOW (ref >90)
GFR calc non Af Amer: 55 mL/min — ABNORMAL LOW (ref >90)
Glucose, Bld: 110 mg/dL — ABNORMAL HIGH (ref 70–99)
Potassium: 3.9 mEq/L (ref 3.5–5.1)
Sodium: 140 mEq/L (ref 135–145)

## 2011-08-14 LAB — TROPONIN I: Troponin I: <0.3 ng/mL (ref <0.30)

## 2011-08-14 LAB — D-DIMER, QUANTITATIVE: D-Dimer, Quant: 1.69 ug/mL-FEU — ABNORMAL HIGH (ref 0.00–0.48)

## 2011-08-14 MED ORDER — ALBUTEROL SULFATE (5 MG/ML) 0.5% IN NEBU
5.0000 mg | INHALATION_SOLUTION | Freq: Once | RESPIRATORY_TRACT | Status: AC
  Administered 2011-08-14: 5 mg via RESPIRATORY_TRACT
  Filled 2011-08-14: qty 1

## 2011-08-14 NOTE — ED Notes (Signed)
Pt ambulated by Marchelle Folks EMT, per Marchelle Folks pt;s spo2 dropped to 84% while ambulating on RA; pt reported feeling okay; EDP notified

## 2011-08-14 NOTE — ED Provider Notes (Signed)
Joel [name] is a 71 y.o. male who presents to Urgent Care today for shortness of breath associated with chest pressure. Patient noted gradual onset of shortness of breath over the past few days however today it worsened. He describes shortness of breath at rest and with exertion worsening today. He describes shortness of breath is intermittent and like a catch sensation.  Additionally he describes vague chest pressure, without significant radiation. He has used albuterol which has not helped much.  He denies any fevers or chills abdominal pain nausea vomiting or diarrhea.  He is currently taking medications listed below including atenolol. He notes that his heart rate is usually in the 50s.   PMH reviewed. Significant for peripheral vascular disease, coronary artery disease, hypertension ROS as above Medications reviewed. No current facility-administered medications for this encounter.   Current Outpatient Prescriptions  Medication Sig Dispense Refill  . albuterol (PROVENTIL HFA;VENTOLIN HFA) 108 (90 BASE) MCG/ACT inhaler Inhale 1-2 puffs into the lungs every 6 (six) hours as needed. For shortness of breath       . amLODipine (NORVASC) 5 MG tablet Take 1 tablet (5 mg total) by mouth daily.  30 tablet  6  . aspirin 81 MG tablet Take 81 mg by mouth 4 (four) times daily.      Marland Kitchen atenolol (TENORMIN) 50 MG tablet Take by mouth daily.       Marland Kitchen dipyridamole-aspirin (AGGRENOX) 25-200 MG per 12 hr capsule Take 1 capsule by mouth 2 (two) times daily.  60 capsule  2  . ferrous sulfate 325 (65 FE) MG tablet Take 325 mg by mouth daily with breakfast.      . gabapentin (NEURONTIN) 300 MG capsule Take 300 mg by mouth daily as needed. For headaches      . losartan-hydrochlorothiazide (HYZAAR) 100-25 MG per tablet Take 1 tablet by mouth daily.        Marland Kitchen omeprazole (PRILOSEC) 20 MG capsule Take 20 mg by mouth daily.       . simvastatin (ZOCOR) 20 MG tablet Take 20 mg by mouth at bedtime.       . traMADol  (ULTRAM) 50 MG tablet Take 50 mg by mouth every 6 (six) hours as needed. For pain. Maximum dose= 8 tablets per day        Exam:  BP 147/78  Pulse 57  Temp(Src) 97.3 F (36.3 C) (Oral)  Resp 17  SpO2 96% was 93% initially Gen: Well NAD HEENT: EOMI,  MMM Lungs: CTABL Nl WOB Heart: Regular but bradycardic at 50 no MRG Abd: NABS, NT, ND Exts: Left lower extremity is larger than the right lower extremity. Large scar on the left lower extremity.   EKG: Sinus bradycardia at 52 beats per minute.  No ST segment elevation or depression. J-point elevation in leads V4 V5. Voltage criteria for left ventricular hypertrophy.  Assessment and Plan: 71 y.o. male with shortness of breath and chest pain. The patient has a relatively normal EKG except for sinus bradycardia. However he does have significant medical history concerning for coronary artery disease.  Additionally his left lower extremity is slightly larger than his right lower extremity.  Also additionally his oxygen saturation was initially 93% after walking in from the waiting room.  I believe he needs a further workup including cardiac enzymes plus minus PE evaluation.  Plan to transfer to the emergency room via shuttle for further evaluation.     Rodolph Bong, MD 08/14/11 [phone]

## 2011-08-14 NOTE — ED Notes (Signed)
Per pt feels sob of breath intermittently can be at rest not associated with activity  - feels can't get breath/dizziness and legs ache   - denies cp or palpatations - spo2 on arrival 93%  Increased to 95 -96 while at rest - pt denies congestion or cough

## 2011-08-14 NOTE — ED Provider Notes (Addendum)
Medical screening examination/treatment/procedure(s) were performed by resident physician and as supervising physician I was immediately available for consultation/collaboration.  Raynald Blend, MD 08/14/11 2035  Jimmie Molly, MD 08/14/11 2055

## 2011-08-14 NOTE — ED Notes (Signed)
Patient complaining of shortness of breath for the last three days; patient denies chest pain/pressure.  Patient was sent by shuttle from Urgent Care for further evaluation.  Patient denies a history of asthma or any other breathing conditions.

## 2011-08-14 NOTE — ED Provider Notes (Signed)
History     CSN: [id number]  Arrival date & time 08/14/11  2005   First MD Initiated Contact with Patient 08/14/11 2031      Chief Complaint  Patient presents with  . Shortness of Breath    (Consider location/radiation/quality/duration/timing/severity/associated sxs/prior treatment) Patient is a 71 y.o. male presenting with shortness of breath. The history is provided by the patient.  Shortness of Breath  Associated symptoms include shortness of breath. Pertinent negatives include no chest pain.   patient's had shortness of breath the last few days. He states he does get a little bit of lower chest tightness with it. He states his chest tightness is different than his previous cardiac pains. He states a short of breath is worse today. States that he used albuterol without relief. No fevers or chills. No nausea or vomiting. No diaphoresis. He has a previous history of coronary artery disease. He states he does not have previous lung problems, but he does have albuterol at home. States he's had a little bit of a chronic cough. He was sent from urgent care down here for further evaluation.  Past Medical History  Diagnosis Date  . GERD (gastroesophageal reflux disease)   . HLD (hyperlipidemia)   . HTN (hypertension)   . Tobacco abuse   . Cough secondary to angiotensin converting enzyme inhibitor (ACE-I)   . Rotator cuff injury   . Angina   . Peripheral vascular disease   . CAD (coronary artery disease)     A. Negative MV 04/2010;  B. Inadequate Stress Echo 05/2011;  C. 05/29/11 Cath: LM-NL, LAD-66m, LCX- 40p, OM1 small 147m, OM2 90, RCA 40-50p, 70m, EF 55-65% - Med Rx   . Iron deficiency anemia     Past Surgical History  Procedure Date  . Iliac artery aneurysm repair   . Circumcision   . Pr vein bypass graft,aorto-fem-pop 10/01/2009    left below knee popliteal artery to posterior tibial artery  . Cardiac catheterization     Family History  Problem Relation Age of Onset  . Breast  cancer Mother     deceased  . Cancer Mother   . Hyperlipidemia Mother   . Hypertension Mother   . COPD Father     deceased  . Cancer Father   . Hyperlipidemia Father   . Hypertension Father   . Hypertension Sister   . Cancer Brother   . Hypertension Brother   . Hypertension Daughter   . Hypertension Son     History  Substance Use Topics  . Smoking status: Former Smoker -- 1.0 packs/day for 55 years    Types: Cigarettes    Quit date: 05/26/2011  . Smokeless tobacco: Never Used  . Alcohol Use: No      Review of Systems  Constitutional: Negative for activity change and appetite change.  HENT: Negative for neck stiffness.   Eyes: Negative for pain.  Respiratory: Positive for shortness of breath. Negative for chest tightness.   Cardiovascular: Negative for chest pain and leg swelling.  Gastrointestinal: Negative for nausea, vomiting, abdominal pain and diarrhea.  Genitourinary: Negative for flank pain.  Musculoskeletal: Negative for back pain.  Skin: Negative for rash.  Neurological: Negative for weakness, numbness and headaches.  Psychiatric/Behavioral: Negative for behavioral problems.    Allergies  Hydrocodone; Imdur; Lisinopril; and Topiramate  Home Medications   Current Outpatient Rx  Name Route Sig Dispense Refill  . ALBUTEROL SULFATE HFA 108 (90 BASE) MCG/ACT IN AERS Inhalation Inhale 1-2 puffs into the  lungs every 6 (six) hours as needed. For shortness of breath     . AMLODIPINE BESYLATE 5 MG PO TABS Oral Take 1 tablet (5 mg total) by mouth daily. 30 tablet 6  . ASPIRIN 81 MG PO TABS Oral Take 182 mg by mouth 2 (two) times daily.     . ATENOLOL 50 MG PO TABS Oral Take by mouth daily.     . ASPIRIN-DIPYRIDAMOLE ER 25-200 MG PO CP12 Oral Take 1 capsule by mouth 2 (two) times daily. 60 capsule 2  . FERROUS SULFATE 325 (65 FE) MG PO TABS Oral Take 325 mg by mouth daily with breakfast.    . GABAPENTIN 300 MG PO CAPS Oral Take 300 mg by mouth daily as needed. For  headaches    . LOSARTAN POTASSIUM-HCTZ 100-25 MG PO TABS Oral Take 1 tablet by mouth daily.      Marland Kitchen OMEPRAZOLE 20 MG PO CPDR Oral Take 20 mg by mouth daily.     Marland Kitchen SIMVASTATIN 20 MG PO TABS Oral Take 20 mg by mouth at bedtime.     . TRAMADOL HCL 50 MG PO TABS Oral Take 50 mg by mouth every 6 (six) hours as needed. For pain. Maximum dose= 8 tablets per day      BP 165/73  Pulse 51  Temp(Src) 97.5 F (36.4 C) (Oral)  Resp 18  Ht 5\' 11"  (1.803 m)  Wt 230 lb (104.327 kg)  BMI 32.08 kg/m2  SpO2 99%  Physical Exam  Nursing note and vitals reviewed. Constitutional: He is oriented to person, place, and time. He appears well-developed and well-nourished.  HENT:  Head: Normocephalic and atraumatic.  Eyes: EOM are normal. Pupils are equal, round, and reactive to light.  Neck: Normal range of motion. Neck supple.  Cardiovascular: Normal rate, regular rhythm and normal heart sounds.   No murmur heard. Pulmonary/Chest: Effort normal. He has wheezes.       Mildly harsh breath sounds throughout.  Abdominal: Soft. Bowel sounds are normal. He exhibits no distension and no mass. There is no tenderness. There is no rebound and no guarding.  Musculoskeletal: Normal range of motion. He exhibits no edema.  Neurological: He is alert and oriented to person, place, and time. No cranial nerve deficit.  Skin: Skin is warm and dry.  Psychiatric: He has a normal mood and affect.    ED Course  Procedures (including critical care time)  Labs Reviewed  CBC - Abnormal; Notable for the following:    Hemoglobin 11.5 (*)    HCT 35.5 (*)    MCV 70.6 (*)    MCH 22.9 (*)    RDW 17.5 (*)    All other components within normal limits  BASIC METABOLIC PANEL - Abnormal; Notable for the following:    Glucose, Bld 110 (*)    GFR calc non Af Amer 55 (*)    GFR calc Af Amer 64 (*)    All other components within normal limits  URINALYSIS, ROUTINE W REFLEX MICROSCOPIC - Abnormal; Notable for the following:    Color,  Urine STRAW (*)    Leukocytes, UA SMALL (*)    All other components within normal limits  D-DIMER, QUANTITATIVE - Abnormal; Notable for the following:    D-Dimer, Quant 1.69 (*)    All other components within normal limits  DIFFERENTIAL  TROPONIN I  URINE MICROSCOPIC-ADD ON   Dg Chest 2 View  08/14/2011  *RADIOLOGY REPORT*  Clinical Data: Shortness of breath.  CHEST -  2 VIEW  Comparison: Chest radiograph performed 06/11/2011  Findings: The lungs are well-aerated and clear.  There is no evidence of focal opacification, pleural effusion or pneumothorax.  The heart is normal in size; the mediastinal contour is within normal limits.  No acute osseous abnormalities are seen.  IMPRESSION: No acute cardiopulmonary process seen.  Original Report Authenticated By: Tonia Ghent, M.D.     No diagnosis found.  EKG from [facility] urgent care reviewed. Stable bradycardia with LVH. Unchanged from previous  MDM  Patient presents with shortness of breath the last 2 days. Mild chest pressure without chest pain. He's a previous cardiac history, this pain he states is different. Patient's lab work is reassuring. Patient had hypoxia with ambulation. D-dimer is been added was positive. CT angio is ordered.        Juliet Rude. Rubin Payor, MD 08/14/11 2328

## 2011-08-15 ENCOUNTER — Emergency Department (HOSPITAL_COMMUNITY): Payer: Medicare Other

## 2011-08-15 MED ORDER — IOHEXOL 350 MG/ML SOLN
100.0000 mL | Freq: Once | INTRAVENOUS | Status: AC | PRN
  Administered 2011-08-15: 100 mL via INTRAVENOUS

## 2011-08-15 MED ORDER — ALBUTEROL SULFATE HFA 108 (90 BASE) MCG/ACT IN AERS: 2.0000 | INHALATION_SPRAY | RESPIRATORY_TRACT | Status: DC

## 2011-08-15 NOTE — Discharge Instructions (Signed)
Increase your albuterol inhaler usage to 2 puffs every 4 hours. Return to the emergency department for worsening condition or new concerning symptoms. Please call Dr. Idelle Crouch office on Monday for followup in the next 3 days.  Shortness of Breath Shortness of breath (dyspnea) is the feeling of uneasy breathing. Shortness of breath does not always mean that there is a life-threatening illness. However, shortness of breath requires immediate medical care. CAUSES  Causes for shortness of breath include:  Not enough oxygen in the air (as with high altitudes or with a smoke-filled room).   Short-term (acute) lung disease, including:   Infections such as pneumonia.   Fluid in the lungs, such as heart failure.   A blood clot in the lungs (pulmonary embolism).   Lasting (chronic) lung diseases.   Heart disease (heart attack, angina, heart failure, and others).   Low red blood cells (anemia).   Poor physical fitness. This can cause shortness of breath when you exercise.   Chest or back injuries or stiffness.   Being overweight (obese).   Anxiety. This can make you feel like you are not getting enough air.  DIAGNOSIS  Serious medical problems can usually be found during your physical exam. Many tests may also be done to determine why you are having shortness of breath. Tests include:  Chest X-rays.   Lung function tests.   Blood tests.   Electrocardiography.   Exercise testing.   A cardiac echo.   Imaging scans.  Your caregiver may not be able to find a cause for your shortness of breath after your exam. In this case, it is important to have a follow-up exam with your caregiver as directed.  HOME CARE INSTRUCTIONS   Do not smoke. Smoking is a common cause of shortness of breath. Ask for help to stop smoking.   Avoid being around chemicals that may bother your breathing (paint fumes, dust).   Rest as needed. Slowly resume your usual activities.   If medicines were  prescribed, take them as directed for the full length of time directed. This includes oxygen and any inhaled medicines.   Follow up with your caregiver as directed. Waiting to do so or failure to follow up could result in worsening of your condition and possible disability or death.   Be sure you understand what to do or who to call if your shortness of breath worsens.  SEEK MEDICAL CARE IF:   Your condition does not improve in the time expected.   You have a hard time doing your normal activities even with rest.   You have any side effects or problems with the medicines prescribed.   You develop any new symptoms.  SEEK IMMEDIATE MEDICAL CARE IF:   Your shortness of breath is getting worse.   You feel lightheaded, faint, or develop a cough not controlled with medicines.   You start coughing up blood.   You have pain with breathing.   You have chest pain or pain in your arms, shoulders, or abdomen.   You have a fever.   You are unable to walk up stairs or exercise the way you normally do.   Your symptoms are getting worse.  Document Released: 12/22/2000 Document Revised: 03/18/2011 Document Reviewed: 08/09/2007 [facility] Patient Information 2012 [place], [place].

## 2011-08-15 NOTE — ED Notes (Signed)
Ambulated pt on RA, lowest spo2 was 91. MD notified.

## 2011-08-15 NOTE — ED Notes (Signed)
Pt asymptomatic when spo2 dropped to 91%; pt reports he does not feel SOB anymore, and did not feel SOB when ambulating either time

## 2011-08-26 NOTE — Assessment & Plan Note (Signed)
On chronic iron, but this might impact a stent approach.

## 2011-08-26 NOTE — Assessment & Plan Note (Addendum)
The patient has scattered three vessel CAD. He does not have unstable symptoms, and does have a chronic microcytic anemia for which he takes iron supplement.  This has apparently been worked up without findings per Dr. Nehemiah Settle.  We discussed an attempt at PCI during his initial hospitalization, and he opted for medical therapy.  Would be inclined mainly toward a BMS in this patient largely due to the anemia, and concerns regarding medical compliance with clopidogrel if this were attempted.  Not smoking is a high priority element here, and he must do his very best to remain stable with the extent of his disease. We will see him in three months or sooner if he has more symptoms.

## 2011-08-26 NOTE — Assessment & Plan Note (Signed)
Controlled.  Close follow up with Dr. Nehemiah Settle.

## 2011-08-26 NOTE — Assessment & Plan Note (Signed)
Important element in his program.

## 2011-09-01 ENCOUNTER — Telehealth: Payer: Self-pay

## 2011-09-01 NOTE — Telephone Encounter (Signed)
I spoke with the pt because Dr Riley Kill would like him to follow-up in our office due to 08/14/11 ER visit.  The pt said his SOB has improved and that he was given an inhaler to use as needed.  The pt feels like the ER visit was caused by medications that Dr Nehemiah Settle started him on for a headache.  The pt said that he also saw [facility] on 5/13 and they started him on a new medication but it made it sick and he feels like the medications that are being prescribed are causing side effects. The pt said his SOB and HA have improved and at this time he does not want to come into our office for an appointment. The pt will keep his scheduled appointment with Dr Riley Kill in August and contact our office with any other questions or concerns. Pt agreed with plan.

## 2011-09-07 ENCOUNTER — Encounter (HOSPITAL_COMMUNITY): Payer: Self-pay | Admitting: Emergency Medicine

## 2011-09-07 ENCOUNTER — Emergency Department (HOSPITAL_COMMUNITY)
Admission: EM | Admit: 2011-09-07 | Discharge: 2011-09-07 | Disposition: A | Discharge status: Home / Self Care | Payer: Medicare Other | Attending: Emergency Medicine | Admitting: Emergency Medicine

## 2011-09-07 ENCOUNTER — Emergency Department (HOSPITAL_COMMUNITY): Payer: Medicare Other

## 2011-09-07 DIAGNOSIS — I10 Essential (primary) hypertension: Secondary | ICD-10-CM | POA: Insufficient documentation

## 2011-09-07 DIAGNOSIS — I739 Peripheral vascular disease, unspecified: Secondary | ICD-10-CM | POA: Insufficient documentation

## 2011-09-07 DIAGNOSIS — R0600 Dyspnea, unspecified: Secondary | ICD-10-CM

## 2011-09-07 DIAGNOSIS — R0989 Other specified symptoms and signs involving the circulatory and respiratory systems: Secondary | ICD-10-CM | POA: Insufficient documentation

## 2011-09-07 DIAGNOSIS — E785 Hyperlipidemia, unspecified: Secondary | ICD-10-CM | POA: Insufficient documentation

## 2011-09-07 DIAGNOSIS — R079 Chest pain, unspecified: Secondary | ICD-10-CM | POA: Insufficient documentation

## 2011-09-07 DIAGNOSIS — R0602 Shortness of breath: Secondary | ICD-10-CM | POA: Insufficient documentation

## 2011-09-07 DIAGNOSIS — K219 Gastro-esophageal reflux disease without esophagitis: Secondary | ICD-10-CM | POA: Insufficient documentation

## 2011-09-07 DIAGNOSIS — I2510 Atherosclerotic heart disease of native coronary artery without angina pectoris: Secondary | ICD-10-CM | POA: Insufficient documentation

## 2011-09-07 DIAGNOSIS — R0609 Other forms of dyspnea: Secondary | ICD-10-CM | POA: Insufficient documentation

## 2011-09-07 LAB — BASIC METABOLIC PANEL
BUN: 11 mg/dL (ref 6–23)
CO2: 25 mEq/L (ref 19–32)
Calcium: 9.4 mg/dL (ref 8.4–10.5)
Chloride: 103 mEq/L (ref 96–112)
Creatinine, Ser: 1.25 mg/dL (ref 0.50–1.35)
GFR calc Af Amer: 65 mL/min — ABNORMAL LOW (ref >90)
GFR calc non Af Amer: 56 mL/min — ABNORMAL LOW (ref >90)
Glucose, Bld: 97 mg/dL (ref 70–99)
Potassium: 4.2 mEq/L (ref 3.5–5.1)
Sodium: 141 mEq/L (ref 135–145)

## 2011-09-07 LAB — CBC
HCT: 37.3 % — ABNORMAL LOW (ref 39.0–52.0)
Hemoglobin: 11.8 g/dL — ABNORMAL LOW (ref 13.0–17.0)
MCH: 22.4 pg — ABNORMAL LOW (ref 26.0–34.0)
MCHC: 31.6 g/dL (ref 30.0–36.0)
MCV: 70.8 fL — ABNORMAL LOW (ref 78.0–100.0)
Platelets: 152 10*3/uL (ref 150–400)
RBC: 5.27 MIL/uL (ref 4.22–5.81)
RDW: 17.2 % — ABNORMAL HIGH (ref 11.5–15.5)
WBC: 4 10*3/uL (ref 4.0–10.5)

## 2011-09-07 LAB — DIFFERENTIAL
Basophils Absolute: 0 10*3/uL (ref 0.0–0.1)
Basophils Relative: 1 % (ref 0–1)
Eosinophils Absolute: 0.2 10*3/uL (ref 0.0–0.7)
Eosinophils Relative: 5 % (ref 0–5)
Lymphocytes Relative: 31 % (ref 12–46)
Lymphs Abs: 1.2 10*3/uL (ref 0.7–4.0)
Monocytes Absolute: 0.3 10*3/uL (ref 0.1–1.0)
Monocytes Relative: 8 % (ref 3–12)
Neutro Abs: 2.2 10*3/uL (ref 1.7–7.7)
Neutrophils Relative %: 55 % (ref 43–77)

## 2011-09-07 LAB — POCT I-STAT TROPONIN I: Troponin i, poc: 0 ng/mL (ref 0.00–0.08)

## 2011-09-07 NOTE — ED Provider Notes (Signed)
History     CSN: [id number]  Arrival date & time 09/07/11  1233   First MD Initiated Contact with Patient 09/07/11 1407      Chief Complaint  Patient presents with  . Shortness of Breath  . Chest Pain    (Consider location/radiation/quality/duration/timing/severity/associated sxs/prior treatment) The history is provided by the patient and medical records.  pt c/o intermittent sob.  Sxs IMPROVE with ambulation and food.  No cough, n/v. No cp/leg pain/  Had similar sxs previously evald in ed including eval for pe. Was neg.   asx now.  No longer smokes.  Past Medical History  Diagnosis Date  . GERD (gastroesophageal reflux disease)   . HLD (hyperlipidemia)   . HTN (hypertension)   . Tobacco abuse   . Cough secondary to angiotensin converting enzyme inhibitor (ACE-I)   . Rotator cuff injury   . Angina   . Peripheral vascular disease   . CAD (coronary artery disease)     A. Negative MV 04/2010;  B. Inadequate Stress Echo 05/2011;  C. 05/29/11 Cath: LM-NL, LAD-32m, LCX- 40p, OM1 small 174m, OM2 90, RCA 40-50p, 66m, EF 55-65% - Med Rx   . Iron deficiency anemia     Past Surgical History  Procedure Date  . Iliac artery aneurysm repair   . Circumcision   . Pr vein bypass graft,aorto-fem-pop 10/01/2009    left below knee popliteal artery to posterior tibial artery  . Cardiac catheterization     Family History  Problem Relation Age of Onset  . Breast cancer Mother     deceased  . Cancer Mother   . Hyperlipidemia Mother   . Hypertension Mother   . COPD Father     deceased  . Cancer Father   . Hyperlipidemia Father   . Hypertension Father   . Hypertension Sister   . Cancer Brother   . Hypertension Brother   . Hypertension Daughter   . Hypertension Son     History  Substance Use Topics  . Smoking status: Former Smoker -- 1.0 packs/day for 55 years    Types: Cigarettes    Quit date: 05/26/2011  . Smokeless tobacco: Never Used  . Alcohol Use: No      Review of  Systems  Constitutional: Negative for fever and diaphoresis.  Respiratory: Positive for shortness of breath. Negative for cough and chest tightness.   Cardiovascular: Negative for chest pain, palpitations and leg swelling.  Gastrointestinal: Negative for nausea and vomiting.  Neurological: Negative for headaches.  All other systems reviewed and are negative.    Allergies  Hydrocodone; Imdur; Lisinopril; and Topiramate  Home Medications   Current Outpatient Rx  Name Route Sig Dispense Refill  . ALBUTEROL SULFATE HFA 108 (90 BASE) MCG/ACT IN AERS Inhalation Inhale 1-2 puffs into the lungs every 6 (six) hours as needed. For shortness of breath     . AMLODIPINE BESYLATE 5 MG PO TABS Oral Take 1 tablet (5 mg total) by mouth daily. 30 tablet 6  . ASPIRIN 81 MG PO TABS Oral Take 182 mg by mouth 2 (two) times daily.     . ATENOLOL 50 MG PO TABS Oral Take by mouth daily.     . ASPIRIN-DIPYRIDAMOLE ER 25-200 MG PO CP12 Oral Take 1 capsule by mouth 2 (two) times daily. 60 capsule 2  . FERROUS SULFATE 325 (65 FE) MG PO TABS Oral Take 325 mg by mouth daily with breakfast.    . GABAPENTIN 300 MG PO CAPS Oral Take  300 mg by mouth daily as needed. For headaches    . GERITOL TONIC PO LIQD Oral Take 5 mLs by mouth every other day.    Marland Kitchen LOSARTAN POTASSIUM-HCTZ 100-25 MG PO TABS Oral Take 1 tablet by mouth daily.      Marland Kitchen OMEPRAZOLE 20 MG PO CPDR Oral Take 20 mg by mouth 2 (two) times daily.     Marland Kitchen SIMVASTATIN 20 MG PO TABS Oral Take 20 mg by mouth at bedtime.     . TRAMADOL HCL 50 MG PO TABS Oral Take 50 mg by mouth every 6 (six) hours as needed. For pain. Maximum dose= 8 tablets per day      BP 131/64  Pulse 51  Temp(Src) 98 F (36.7 C) (Oral)  Resp 14  SpO2 98%  Physical Exam  Nursing note and vitals reviewed. Constitutional: He is oriented to person, place, and time. He appears well-developed and well-nourished. No distress.  HENT:  Head: Normocephalic and atraumatic.  Eyes: EOM are normal.  Pupils are equal, round, and reactive to light.  Neck: Normal range of motion. Neck supple.  Cardiovascular: Normal rate, regular rhythm and normal heart sounds.   No murmur heard. Pulmonary/Chest: Effort normal and breath sounds normal. No respiratory distress. He has no wheezes. He has no rales.  Abdominal: Soft. Bowel sounds are normal. He exhibits no distension and no mass. There is no tenderness. There is no rebound and no guarding.  Musculoskeletal: Normal range of motion. He exhibits no edema and no tenderness.  Neurological: He is alert and oriented to person, place, and time. No cranial nerve deficit.  Skin: Skin is warm and dry. He is not diaphoretic.  Psychiatric: He has a normal mood and affect. His behavior is normal.    ED Course  Procedures (including critical care time)   Labs Reviewed  CBC  DIFFERENTIAL  BASIC METABOLIC PANEL   Dg Chest Port 1 View  09/07/2011  *RADIOLOGY REPORT*  Clinical Data: Pneumonia, shortness of breath.  PORTABLE CHEST - 1 VIEW  Comparison: 08/14/2011  Findings: Heart and mediastinal contours are within normal limits. No focal opacities or effusions.  No acute bony abnormality.  IMPRESSION: No active cardiopulmonary disease.  Original Report Authenticated By: Cyndie Chime, M.D.     No diagnosis found.    MDM  Dyspnea No signs acs, pneumonia, hypoxia, ptx No resp distress        Cheri Guppy, MD 09/07/11 [phone]

## 2011-09-07 NOTE — Discharge Instructions (Signed)
Your chest x-ray, and blood tests do not show any significant illness.  You do not have any signs of damage to your heart.  Followup with your Dr. for reevaluation.  If your symptoms persist

## 2011-09-07 NOTE — ED Notes (Signed)
Pt states that he has had episodes of sob and chest pain over the last week with a worsening of symptoms in the past 2 days.  Pt denies cough or URI symptoms.  Pt states that he has seen his doctor and was prescribed an inhaler, but it seems to be losing its effectiveness.  Pt denies sob at present.  Pt states that it feels like something is moving in his epigastric area that causes his breath to cut off. Pt put on monitor.  Lungs clear and equal.

## 2011-09-15 ENCOUNTER — Encounter (HOSPITAL_COMMUNITY): Payer: Self-pay

## 2011-09-15 ENCOUNTER — Emergency Department (HOSPITAL_COMMUNITY): Payer: Medicare Other

## 2011-09-15 ENCOUNTER — Telehealth: Payer: Self-pay | Admitting: Cardiology

## 2011-09-15 ENCOUNTER — Emergency Department (HOSPITAL_COMMUNITY)
Admission: EM | Admit: 2011-09-15 | Discharge: 2011-09-15 | Disposition: A | Discharge status: Home / Self Care | Payer: Medicare Other | Attending: Emergency Medicine | Admitting: Emergency Medicine

## 2011-09-15 DIAGNOSIS — I10 Essential (primary) hypertension: Secondary | ICD-10-CM | POA: Insufficient documentation

## 2011-09-15 DIAGNOSIS — K219 Gastro-esophageal reflux disease without esophagitis: Secondary | ICD-10-CM | POA: Insufficient documentation

## 2011-09-15 DIAGNOSIS — E785 Hyperlipidemia, unspecified: Secondary | ICD-10-CM | POA: Insufficient documentation

## 2011-09-15 DIAGNOSIS — R0602 Shortness of breath: Secondary | ICD-10-CM | POA: Insufficient documentation

## 2011-09-15 DIAGNOSIS — R0609 Other forms of dyspnea: Secondary | ICD-10-CM | POA: Insufficient documentation

## 2011-09-15 DIAGNOSIS — R0989 Other specified symptoms and signs involving the circulatory and respiratory systems: Secondary | ICD-10-CM | POA: Insufficient documentation

## 2011-09-15 DIAGNOSIS — R0600 Dyspnea, unspecified: Secondary | ICD-10-CM

## 2011-09-15 LAB — CBC
HCT: 37 % — ABNORMAL LOW (ref 39.0–52.0)
Hemoglobin: 11.9 g/dL — ABNORMAL LOW (ref 13.0–17.0)
MCH: 22.8 pg — ABNORMAL LOW (ref 26.0–34.0)
MCHC: 32.2 g/dL (ref 30.0–36.0)
MCV: 70.7 fL — ABNORMAL LOW (ref 78.0–100.0)
Platelets: 144 10*3/uL — ABNORMAL LOW (ref 150–400)
RBC: 5.23 MIL/uL (ref 4.22–5.81)
RDW: 16.9 % — ABNORMAL HIGH (ref 11.5–15.5)
WBC: 3.6 10*3/uL — ABNORMAL LOW (ref 4.0–10.5)

## 2011-09-15 LAB — DIFFERENTIAL
Basophils Absolute: 0 10*3/uL (ref 0.0–0.1)
Basophils Relative: 1 % (ref 0–1)
Eosinophils Absolute: 0.2 10*3/uL (ref 0.0–0.7)
Eosinophils Relative: 6 % — ABNORMAL HIGH (ref 0–5)
Lymphocytes Relative: 29 % (ref 12–46)
Lymphs Abs: 1.1 10*3/uL (ref 0.7–4.0)
Monocytes Absolute: 0.3 10*3/uL (ref 0.1–1.0)
Monocytes Relative: 9 % (ref 3–12)
Neutro Abs: 2 10*3/uL (ref 1.7–7.7)
Neutrophils Relative %: 55 % (ref 43–77)

## 2011-09-15 LAB — COMPREHENSIVE METABOLIC PANEL
ALT: 25 U/L (ref 0–53)
AST: 21 U/L (ref 0–37)
Albumin: 3.8 g/dL (ref 3.5–5.2)
Alkaline Phosphatase: 68 U/L (ref 39–117)
BUN: 12 mg/dL (ref 6–23)
CO2: 25 mEq/L (ref 19–32)
Calcium: 9.2 mg/dL (ref 8.4–10.5)
Chloride: 102 mEq/L (ref 96–112)
Creatinine, Ser: 1.15 mg/dL (ref 0.50–1.35)
GFR calc Af Amer: 72 mL/min — ABNORMAL LOW (ref >90)
GFR calc non Af Amer: 62 mL/min — ABNORMAL LOW (ref >90)
Glucose, Bld: 113 mg/dL — ABNORMAL HIGH (ref 70–99)
Potassium: 3.9 mEq/L (ref 3.5–5.1)
Sodium: 137 mEq/L (ref 135–145)
Total Bilirubin: 0.2 mg/dL — ABNORMAL LOW (ref 0.3–1.2)
Total Protein: 7.1 g/dL (ref 6.0–8.3)

## 2011-09-15 LAB — BLOOD GAS, ARTERIAL
Acid-Base Excess: 0.1 mmol/L (ref 0.0–2.0)
Bicarbonate: 24.5 mEq/L — ABNORMAL HIGH (ref 20.0–24.0)
Drawn by: 331471
O2 Saturation: 97.3 %
Patient temperature: 98.6
TCO2: 22.2 mmol/L (ref 0–100)
pCO2 arterial: 40.7 mmHg (ref 35.0–45.0)
pH, Arterial: 7.396 (ref 7.350–7.450)
pO2, Arterial: 88 mmHg (ref 80.0–100.0)

## 2011-09-15 LAB — D-DIMER, QUANTITATIVE: D-Dimer, Quant: 1.74 ug/mL-FEU — ABNORMAL HIGH (ref 0.00–0.48)

## 2011-09-15 LAB — TROPONIN I: Troponin I: <0.3 ng/mL (ref <0.30)

## 2011-09-15 MED ORDER — IOHEXOL 300 MG/ML  SOLN
100.0000 mL | Freq: Once | INTRAMUSCULAR | Status: AC | PRN
  Administered 2011-09-15: 100 mL via INTRAVENOUS

## 2011-09-15 NOTE — ED Notes (Signed)
Pt c/o sob since this am, states worse when bending over, states tightness in chest. Pt states when SOB this am had a discomfort in chest causing a him to have a BM. EDP Ray at bedside

## 2011-09-15 NOTE — ED Provider Notes (Signed)
History     CSN: [id number]  Arrival date & time 09/15/11  4098   First MD Initiated Contact with Patient 09/15/11 [phone]      Chief Complaint  Patient presents with  . Shortness of Breath    (Consider location/radiation/quality/duration/timing/severity/associated sxs/prior treatment) HPI  My breathing is just messed up. Patient states that he has been unable to breathe appropriately since this morning. He states it is like if you take a deep breath and hold it. He denies any chest pain although he does state initially that was tight and then describing it as the holding breath. He did have a recent episode he was seen in the emergency department for similar symptoms and he states that no cause was found. This occurred 2 weeks ago. That it resolved while he is in the emergency department has not returned until this morning. He has not had any fever, cough, nausea, vomiting, diarrhea, leg swelling, or orthopnea appear he denies any fever or chills.  Past Medical History  Diagnosis Date  . GERD (gastroesophageal reflux disease)   . HLD (hyperlipidemia)   . HTN (hypertension)   . Tobacco abuse   . Cough secondary to angiotensin converting enzyme inhibitor (ACE-I)   . Rotator cuff injury   . Angina   . Peripheral vascular disease   . CAD (coronary artery disease)     A. Negative MV 04/2010;  B. Inadequate Stress Echo 05/2011;  C. 05/29/11 Cath: LM-NL, LAD-34m, LCX- 40p, OM1 small 19m, OM2 90, RCA 40-50p, 55m, EF 55-65% - Med Rx   . Iron deficiency anemia     Past Surgical History  Procedure Date  . Iliac artery aneurysm repair   . Circumcision   . Pr vein bypass graft,aorto-fem-pop 10/01/2009    left below knee popliteal artery to posterior tibial artery  . Cardiac catheterization     Family History  Problem Relation Age of Onset  . Breast cancer Mother     deceased  . Cancer Mother   . Hyperlipidemia Mother   . Hypertension Mother   . COPD Father     deceased  . Cancer  Father   . Hyperlipidemia Father   . Hypertension Father   . Hypertension Sister   . Cancer Brother   . Hypertension Brother   . Hypertension Daughter   . Hypertension Son     History  Substance Use Topics  . Smoking status: Former Smoker -- 1.0 packs/day for 55 years    Types: Cigarettes    Quit date: 05/26/2011  . Smokeless tobacco: Never Used  . Alcohol Use: No      Review of Systems  All other systems reviewed and are negative.    Allergies  Hydrocodone; Imdur; Lisinopril; and Topiramate  Home Medications   Current Outpatient Rx  Name Route Sig Dispense Refill  . ALBUTEROL SULFATE HFA 108 (90 BASE) MCG/ACT IN AERS Inhalation Inhale 1-2 puffs into the lungs every 6 (six) hours as needed. For shortness of breath     . AMLODIPINE BESYLATE 5 MG PO TABS Oral Take 1 tablet (5 mg total) by mouth daily. 30 tablet 6  . ASPIRIN 81 MG PO TABS Oral Take 162 mg by mouth 2 (two) times daily.     . ATENOLOL 50 MG PO TABS Oral Take 50 mg by mouth daily.     . ASPIRIN-DIPYRIDAMOLE ER 25-200 MG PO CP12 Oral Take 1 capsule by mouth 2 (two) times daily. 60 capsule 2  . FERROUS SULFATE  325 (65 FE) MG PO TABS Oral Take 325 mg by mouth daily with breakfast.    . GABAPENTIN 300 MG PO CAPS Oral Take 300 mg by mouth daily as needed. For headaches    . GERITOL TONIC PO LIQD Oral Take 5 mLs by mouth every other day.    Marland Kitchen LOSARTAN POTASSIUM-HCTZ 100-25 MG PO TABS Oral Take 1 tablet by mouth daily.      Marland Kitchen OMEPRAZOLE 20 MG PO CPDR Oral Take 20 mg by mouth 2 (two) times daily.     Marland Kitchen SIMVASTATIN 20 MG PO TABS Oral Take 20 mg by mouth at bedtime.     . TRAMADOL HCL 50 MG PO TABS Oral Take 50 mg by mouth every 6 (six) hours as needed. For pain. Maximum dose= 8 tablets per day      BP 139/77  Pulse 47  Temp(Src) 97.6 F (36.4 C) (Oral)  Resp 16  SpO2 99%  Physical Exam  Nursing note and vitals reviewed. Constitutional: He is oriented to person, place, and time. He appears well-developed and  well-nourished.  HENT:  Head: Normocephalic and atraumatic.  Right Ear: External ear normal.  Left Ear: External ear normal.  Eyes: Conjunctivae and EOM are normal. Pupils are equal, round, and reactive to light.  Neck: Normal range of motion. Neck supple.  Cardiovascular: Normal rate, regular rhythm, normal heart sounds and intact distal pulses.   Pulmonary/Chest: Effort normal and breath sounds normal.  Abdominal: Soft. Bowel sounds are normal.  Musculoskeletal: Normal range of motion.  Neurological: He is alert and oriented to person, place, and time. He has normal reflexes.  Skin: Skin is warm and dry.  Psychiatric: He has a normal mood and affect.    ED Course  Procedures (including critical care time)  Labs Reviewed  CBC - Abnormal; Notable for the following:    WBC 3.6 (*)    Hemoglobin 11.9 (*)    HCT 37.0 (*)    MCV 70.7 (*)    MCH 22.8 (*)    RDW 16.9 (*)    Platelets 144 (*)    All other components within normal limits  DIFFERENTIAL - Abnormal; Notable for the following:    Eosinophils Relative 6 (*)    All other components within normal limits  COMPREHENSIVE METABOLIC PANEL - Abnormal; Notable for the following:    Glucose, Bld 113 (*)    Total Bilirubin 0.2 (*)    GFR calc non Af Amer 62 (*)    GFR calc Af Amer 72 (*)    All other components within normal limits  BLOOD GAS, ARTERIAL - Abnormal; Notable for the following:    Bicarbonate 24.5 (*)    All other components within normal limits  D-DIMER, QUANTITATIVE - Abnormal; Notable for the following:    D-Dimer, Quant 1.74 (*)    All other components within normal limits  TROPONIN I   Dg Chest 2 View  09/15/2011  *RADIOLOGY REPORT*  Clinical Data: Intermittent short of breath for 3 weeks.  History of hypertension and smoking.  CHEST - 2 VIEW  Comparison: 09/07/2011 radiographs.  CT 08/15/2011.  Findings: The heart size and mediastinal contours are stable.  The lungs are clear.  There is no pleural effusion or  pneumothorax.  IMPRESSION: Stable examination.  No active cardiopulmonary process.  Original Report Authenticated By: Gerrianne Scale, M.D.   Ct Angio Chest W/cm &/or Wo Cm  09/15/2011  *RADIOLOGY REPORT*  Clinical Data: Periodic short of breath.  Pulmonary embolus.  CT ANGIOGRAPHY CHEST  Technique:  Multidetector CT imaging of the chest using the standard protocol during bolus administration of intravenous contrast. Multiplanar reconstructed images including MIPs were obtained and reviewed to evaluate the vascular anatomy.  Contrast: OMNIPAQUE IOHEXOL 300 MG/ML  SOLN  Comparison: 02/11/2009.  Findings: Technically adequate study.  No pulmonary embolus.  No acute aortic abnormality.  Extensive coronary artery atherosclerosis is present.  Stable low density lesions in the right hepatic lobe, probably representing cysts.  Small hiatal hernia with patulous gastroesophageal junction.  There is no axillary adenopathy.  No mediastinal or hilar adenopathy.  No pericardial or pleural effusion.  The lungs demonstrate dependent atelectasis.  Calcified mediastinal lymph nodes are present compatible with old granulomatous disease.  Thoracic spondylosis and Schmorl's nodes.  No aggressive osseous lesions are present. Tiny subpleural nodules along the right minor fissure are unchanged compared to prior exam (image 43 series 12).  These are compatible with a benign etiology.  These probably represent subpleural lymph nodes.  IMPRESSION: 1.  Negative for pulmonary embolus or acute cardiopulmonary disease. 2.  Atherosclerosis and coronary artery disease. 3.  Old granulomatous disease. 4.  Small hiatal hernia and patulous gastroesophageal junction. 5.  Subpleural nodules adjacent to the minor fissure are stable compared to prior, compatible with benign subpleural lymph nodes.  Original Report Authenticated By: Andreas Newport, M.D.     No diagnosis found.    MDM  Patient with CBC within normal limits without any  reason to think that the patient's dyspnea is due to anemia. He has a chest x-Deaaron Fulghum without any acute abnormality. He did have a slightly elevated d-dimer and had a CT angiogram which is negative for pulmonary embolism. No acute abnormalities are noted on his EKG. ABG shows a PO2 of 88 with normal pH. I do not find any source of dyspnea and in fact the patient's blood oxygenation is normal on room air. He is advised to followup with his primary care doctor and return if he has any new or worsening symptoms.       Hilario Quarry, MD 09/15/11 1315

## 2011-09-15 NOTE — Telephone Encounter (Signed)
Pt having trouble with SOB, pls advise [phone]

## 2011-09-15 NOTE — ED Notes (Signed)
Pt reports intermittent "breathing problem".  Pt reports he's had work up done in the past and everything was WNL.  Pt has had a cardiac cath done as well.  Pt denies swelling in hands and ankles or SOB when laying flat.  Pt reports laying flat actually help the SOB.  Pt is A&Ox 4.  Denies any cp at this time.

## 2011-09-15 NOTE — Telephone Encounter (Signed)
The pt went back into the ER today complaining of SOB.  This is the second time in the past month that he has gone to the ER for SOB.  I had previously contacted the pt to try and arrange earlier follow-up with Dr Riley Kill but the pt did not want to make an appointment at that time.  I have scheduled the pt to see Dr Riley Kill on 09/20/11 for evaluation of symptoms.

## 2011-09-20 ENCOUNTER — Ambulatory Visit (INDEPENDENT_AMBULATORY_CARE_PROVIDER_SITE_OTHER): Payer: Medicare Other | Admitting: Cardiology

## 2011-09-20 VITALS — BP 140/80 | HR 58 | Ht 71.0 in | Wt 230.4 lb

## 2011-09-20 DIAGNOSIS — I208 Other forms of angina pectoris: Secondary | ICD-10-CM

## 2011-09-20 DIAGNOSIS — I2510 Atherosclerotic heart disease of native coronary artery without angina pectoris: Secondary | ICD-10-CM

## 2011-09-20 DIAGNOSIS — I209 Angina pectoris, unspecified: Secondary | ICD-10-CM

## 2011-09-20 DIAGNOSIS — D509 Iron deficiency anemia, unspecified: Secondary | ICD-10-CM

## 2011-09-20 DIAGNOSIS — E785 Hyperlipidemia, unspecified: Secondary | ICD-10-CM

## 2011-09-20 NOTE — Patient Instructions (Addendum)
Your physician recommends that you schedule a follow-up appointment in: 2 WEEKS  STOP AGGRENOX

## 2011-10-06 ENCOUNTER — Ambulatory Visit: Payer: Medicare Other | Admitting: Cardiology

## 2011-10-12 NOTE — Progress Notes (Signed)
HPI:  The patient is seen in follow up. Generally he is stable, and he and I had another long conversation about his treatment, and issues regarding his medical treatment for CAD.  He was recently seen in the ER at [facility] again for some dyspnea.  The findings were as follows:  Patient with CBC within normal limits without any reason to think that the patient's dyspnea is due to anemia. He has a chest x-ray without any acute abnormality. He did have a slightly elevated d-dimer and had a CT angiogram which is negative for pulmonary embolism. No acute abnormalities are noted on his EKG. ABG shows a PO2 of 88 with normal pH. I do not find any source of dyspnea and in fact the patient's blood oxygenation is normal on room air. He is advised to followup with his primary care doctor and return if he has any new or worsening symptoms.  His CT scan was done for an elevated D-dimer and revealed:  IMPRESSION: 1. Negative for pulmonary embolus or acute cardiopulmonary disease. 2. Atherosclerosis and coronary artery disease. 3. Old granulomatous disease. 4. Small hiatal hernia and patulous gastroesophageal junction. 5. Subpleural nodules adjacent to the minor fissure are stable compared to prior, compatible with benign subpleural lymph nodes.  Original Report Authenticated By: Andreas Newport, M.D.       Last Resulted: 09/15/11 12:59 PM     SInce he was seen in the ER he has been stable.  He denies any chest pain.  He has been followed by Dr. Nehemiah Settle as an outpatient.  He had a microcytic anemia that was worked up in the past, and he continues with this in his latest findings.  He also has some noted panctyopenia, and we have talked about getting back in to see Dr. Nehemiah Settle. Also, the patient has some confusion about his medications.      Current Outpatient Prescriptions  Medication Sig Dispense Refill  . albuterol (PROVENTIL HFA;VENTOLIN HFA) 108 (90 BASE) MCG/ACT inhaler Inhale 1-2 puffs into the lungs  every 6 (six) hours as needed. For shortness of breath       . aspirin 81 MG tablet Take 162 mg by mouth 2 (two) times daily.       Marland Kitchen atenolol (TENORMIN) 50 MG tablet Take 50 mg by mouth daily.       . ferrous sulfate 325 (65 FE) MG tablet Take 325 mg by mouth daily with breakfast.      . gabapentin (NEURONTIN) 300 MG capsule Take 300 mg by mouth daily as needed. For headaches      . Iron-Vitamins (GERITOL) LIQD Take 5 mLs by mouth every other day.      . losartan-hydrochlorothiazide (HYZAAR) 100-25 MG per tablet Take 1 tablet by mouth daily.        Marland Kitchen omeprazole (PRILOSEC) 20 MG capsule Take 20 mg by mouth 2 (two) times daily.       . simvastatin (ZOCOR) 20 MG tablet Take 20 mg by mouth at bedtime.       . traMADol (ULTRAM) 50 MG tablet Take 50 mg by mouth every 6 (six) hours as needed. For pain. Maximum dose= 8 tablets per day      . amLODipine (NORVASC) 5 MG tablet Take 1 tablet (5 mg total) by mouth daily.  30 tablet  6    Allergies  Allergen Reactions  . Hydrocodone     Shortness of breath and leg cramps  . Imdur (Isosorbide Mononitrate) Nausea Only  Headache  . Lisinopril     REACTION: Cough  . Topiramate     Shortness of breath and leg cramps    Past Medical History  Diagnosis Date  . GERD (gastroesophageal reflux disease)   . HLD (hyperlipidemia)   . HTN (hypertension)   . Tobacco abuse   . Cough secondary to angiotensin converting enzyme inhibitor (ACE-I)   . Rotator cuff injury   . Angina   . Peripheral vascular disease   . CAD (coronary artery disease)     A. Negative MV 04/2010;  B. Inadequate Stress Echo 05/2011;  C. 05/29/11 Cath: LM-NL, LAD-61m, LCX- 40p, OM1 small 11m, OM2 90, RCA 40-50p, 25m, EF 55-65% - Med Rx   . Iron deficiency anemia     Past Surgical History  Procedure Date  . Iliac artery aneurysm repair   . Circumcision   . Pr vein bypass graft,aorto-fem-pop 10/01/2009    left below knee popliteal artery to posterior tibial artery  . Cardiac  catheterization     Family History  Problem Relation Age of Onset  . Breast cancer Mother     deceased  . Cancer Mother   . Hyperlipidemia Mother   . Hypertension Mother   . COPD Father     deceased  . Cancer Father   . Hyperlipidemia Father   . Hypertension Father   . Hypertension Sister   . Cancer Brother   . Hypertension Brother   . Hypertension Daughter   . Hypertension Son     History   Social History  . Marital Status: Widowed    Spouse Name: N/A    Number of Children: N/A  . Years of Education: N/A   Occupational History  . Not on file.   Social History Main Topics  . Smoking status: Former Smoker -- 1.0 packs/day for 55 years    Types: Cigarettes    Quit date: 05/26/2011  . Smokeless tobacco: Never Used  . Alcohol Use: No  . Drug Use: No  . Sexually Active: Not on file   Other Topics Concern  . Not on file   Social History Narrative   Single. Retired.     ROS: Please see the HPI.  All other systems reviewed and negative.  PHYSICAL EXAM:  BP 140/80  Pulse 58  Ht 5\' 11"  (1.803 m)  Wt 230 lb 6.4 oz (104.509 kg)  BMI 32.13 kg/m2  General: Well developed, well nourished, in no acute distress. Head:  Normocephalic and atraumatic. Neck: no JVD Lungs: Clear to auscultation and percussion. Heart: Normal S1 and S2.  No murmur, rubs or gallops.  Abdomen:  Normal bowel sounds; soft; non tender; no organomegaly Pulses: Pulses normal in all 4 extremities. Extremities: No clubbing or cyanosis. No edema. Neurologic: Alert and oriented x 3.  EKG:  See 6/5 ECG. No acute changes.    ASSESSMENT AND PLAN:

## 2011-10-17 NOTE — Assessment & Plan Note (Signed)
His status coronary wise appears to be stable.  He remains on a baby ASA.

## 2011-10-17 NOTE — Assessment & Plan Note (Signed)
See note under angina.  Will see back in a few weeks in follow up.

## 2011-10-17 NOTE — Assessment & Plan Note (Signed)
This has been chronic, the etiology unknown.  It does make him a marginal candidate for DAPT over a long period.  Will defer further workup to Dr. Nehemiah Settle.

## 2011-10-17 NOTE — Assessment & Plan Note (Signed)
On fairly low doses of simva and atorva, but likely should make a change with one of these.  Will do so when he brings his meds back for follow up.

## 2011-11-11 ENCOUNTER — Ambulatory Visit: Payer: Medicare Other | Admitting: Cardiology

## 2011-12-24 ENCOUNTER — Ambulatory Visit (INDEPENDENT_AMBULATORY_CARE_PROVIDER_SITE_OTHER): Payer: Medicare Other | Admitting: Cardiology

## 2011-12-24 ENCOUNTER — Encounter: Payer: Self-pay | Admitting: Cardiology

## 2011-12-24 VITALS — BP 139/88 | HR 48 | Ht 71.0 in | Wt 224.0 lb

## 2011-12-24 DIAGNOSIS — M25519 Pain in unspecified shoulder: Secondary | ICD-10-CM

## 2011-12-24 DIAGNOSIS — I2510 Atherosclerotic heart disease of native coronary artery without angina pectoris: Secondary | ICD-10-CM

## 2011-12-24 DIAGNOSIS — R05 Cough: Secondary | ICD-10-CM

## 2011-12-24 DIAGNOSIS — R059 Cough, unspecified: Secondary | ICD-10-CM

## 2011-12-24 MED ORDER — AMLODIPINE BESYLATE 5 MG PO TABS: 5.0000 mg | ORAL_TABLET | Freq: Every day | ORAL | Status: DC

## 2011-12-24 MED ORDER — ATENOLOL 25 MG PO TABS: 25.0000 mg | ORAL_TABLET | Freq: Every day | ORAL | Status: DC

## 2011-12-24 NOTE — Assessment & Plan Note (Signed)
The patient uses tramadol for this.

## 2011-12-24 NOTE — Patient Instructions (Addendum)
Your physician has recommended you make the following change in your medication: DECREASE Atenolol to 25mg  take one by mouth daily, START Amlodipine 5mg  take one by mouth daily  Your physician recommends that you schedule a follow-up appointment in: 3-4 WEEKS with Dr Riley Kill

## 2011-12-24 NOTE — Assessment & Plan Note (Signed)
The patient's cough has resolved since using an ARB.

## 2011-12-24 NOTE — Assessment & Plan Note (Signed)
Patient symptoms are somewhat atypical. However we cannot exclude the fact that he has underlying coronary disease, and he certainly could be anginal. He is rather markedly bradycardic at this point in time, and we will decrease his atenolol to 25 mg daily. He does not appear to be taking amlodipine, as previously listed on his list, and we will start him on amlodipine at 5 mg daily in order to control his blood pressure could potentially help with his angina. I hope is that his exercise tolerance will improve. He will currently remain on his current medical therapy I will see him back in followup in 3-4 weeks to reassess his status.

## 2011-12-24 NOTE — Progress Notes (Signed)
HPI:  Patient returns today in followup. He still notices at some lateral chest discomfort, but he is neither better or worse. He says however he is been somewhat more lazy and tired recently. He also takes and tramadol for pain, but has pain related to his rotator cuff. He denies any progressive symptoms at the present time. We went over his medicines and detail, and there was some confusion as to what he was actually taking, but we went over many times.  Current Outpatient Prescriptions  Medication Sig Dispense Refill  . albuterol (PROVENTIL HFA;VENTOLIN HFA) 108 (90 BASE) MCG/ACT inhaler Inhale 1-2 puffs into the lungs every 6 (six) hours as needed. For shortness of breath       . aspirin 81 MG tablet Take 162 mg by mouth 2 (two) times daily.       Marland Kitchen atenolol (TENORMIN) 25 MG tablet Take 1 tablet (25 mg total) by mouth daily.  30 tablet  6  . ferrous sulfate 325 (65 FE) MG tablet Take 325 mg by mouth daily with breakfast.      . Iron-Vitamins (GERITOL) LIQD Take 5 mLs by mouth every other day.      . losartan-hydrochlorothiazide (HYZAAR) 100-25 MG per tablet Take 1 tablet by mouth daily.        Marland Kitchen omeprazole (PRILOSEC) 20 MG capsule Take 20 mg by mouth 2 (two) times daily.       . simvastatin (ZOCOR) 20 MG tablet Take 20 mg by mouth at bedtime.       . traMADol (ULTRAM) 50 MG tablet Take 50 mg by mouth every 6 (six) hours as needed. For pain. Maximum dose= 8 tablets per day      . DISCONTD: atenolol (TENORMIN) 50 MG tablet Take 50 mg by mouth daily.       Marland Kitchen amLODipine (NORVASC) 5 MG tablet Take 1 tablet (5 mg total) by mouth daily.  30 tablet  6  . DISCONTD: amLODipine (NORVASC) 5 MG tablet Take 1 tablet (5 mg total) by mouth daily.  30 tablet  6    Allergies  Allergen Reactions  . Hydrocodone     Shortness of breath and leg cramps  . Imdur (Isosorbide Mononitrate) Nausea Only    Headache  . Lisinopril     REACTION: Cough  . Topiramate     Shortness of breath and leg cramps     Past Medical History  Diagnosis Date  . GERD (gastroesophageal reflux disease)   . HLD (hyperlipidemia)   . HTN (hypertension)   . Tobacco abuse   . Cough secondary to angiotensin converting enzyme inhibitor (ACE-I)   . Rotator cuff injury   . Angina   . Peripheral vascular disease   . CAD (coronary artery disease)     A. Negative MV 04/2010;  B. Inadequate Stress Echo 05/2011;  C. 05/29/11 Cath: LM-NL, LAD-24m, LCX- 40p, OM1 small 117m, OM2 90, RCA 40-50p, 80m, EF 55-65% - Med Rx   . Iron deficiency anemia     Past Surgical History  Procedure Date  . Iliac artery aneurysm repair   . Circumcision   . Pr vein bypass graft,aorto-fem-pop 10/01/2009    left below knee popliteal artery to posterior tibial artery  . Cardiac catheterization     Family History  Problem Relation Age of Onset  . Breast cancer Mother     deceased  . Cancer Mother   . Hyperlipidemia Mother   . Hypertension Mother   . COPD Father  deceased  . Cancer Father   . Hyperlipidemia Father   . Hypertension Father   . Hypertension Sister   . Cancer Brother   . Hypertension Brother   . Hypertension Daughter   . Hypertension Son     History   Social History  . Marital Status: Widowed    Spouse Name: N/A    Number of Children: N/A  . Years of Education: N/A   Occupational History  . Not on file.   Social History Main Topics  . Smoking status: Former Smoker -- 1.0 packs/day for 55 years    Types: Cigarettes    Quit date: 05/26/2011  . Smokeless tobacco: Never Used  . Alcohol Use: No  . Drug Use: No  . Sexually Active: Not on file   Other Topics Concern  . Not on file   Social History Narrative   Single. Retired.     ROS: Please see the HPI.  All other systems reviewed and negative.  PHYSICAL EXAM:  BP 139/88  Pulse 48  Ht 5\' 11"  (1.803 m)  Wt 224 lb (101.606 kg)  BMI 31.24 kg/m2  General: Well developed, well nourished, in no acute distress. Head:  Normocephalic and  atraumatic. Neck: no JVD Lungs: Some prolonged expiration without rales.   Heart: Normal S1 and S2.  No murmur, rubs or gallops.  Abdomen:  Normal bowel sounds; soft; non tender; no organomegaly Extremities: No clubbing or cyanosis. No edema. Neurologic: Alert and oriented x 3.  EKG:  Marked sinus bradycardia with first degree av block.  PR 222 ms. ASSESSMENT AND PLAN:

## 2012-01-19 ENCOUNTER — Ambulatory Visit: Payer: Medicare Other | Admitting: Cardiology

## 2012-01-26 ENCOUNTER — Encounter: Payer: Self-pay | Admitting: Cardiology

## 2012-01-26 ENCOUNTER — Ambulatory Visit (INDEPENDENT_AMBULATORY_CARE_PROVIDER_SITE_OTHER): Payer: Medicare Other | Admitting: Cardiology

## 2012-01-26 VITALS — BP 118/70 | HR 49 | Ht 71.0 in | Wt 226.0 lb

## 2012-01-26 DIAGNOSIS — I2510 Atherosclerotic heart disease of native coronary artery without angina pectoris: Secondary | ICD-10-CM

## 2012-01-26 DIAGNOSIS — E785 Hyperlipidemia, unspecified: Secondary | ICD-10-CM

## 2012-01-26 DIAGNOSIS — I10 Essential (primary) hypertension: Secondary | ICD-10-CM

## 2012-01-26 NOTE — Patient Instructions (Addendum)
Your physician recommends that you schedule a follow-up appointment in: 3 MONTHS  Your physician recommends that you continue on your current medications as directed. Please refer to the Current Medication list given to you today.   

## 2012-01-29 NOTE — Progress Notes (Signed)
HPI:  The patient returns today in followup. He actually stopped smoking about 2 months ago. He spoke with her student today regarding his experience with cigarette smoking. He also says he seems to feel quite a bit better. He's not had recurrent chest pain. I'm overall pleased that he is quit, and pleased he seems to improve concomitant with that.  Current Outpatient Prescriptions  Medication Sig Dispense Refill  . albuterol (PROVENTIL HFA;VENTOLIN HFA) 108 (90 BASE) MCG/ACT inhaler Inhale 1-2 puffs into the lungs every 6 (six) hours as needed. For shortness of breath       . amLODipine (NORVASC) 5 MG tablet Take 1 tablet (5 mg total) by mouth daily.  30 tablet  6  . aspirin 81 MG tablet Take 162 mg by mouth 2 (two) times daily.       Marland Kitchen atenolol (TENORMIN) 25 MG tablet Take 1 tablet (25 mg total) by mouth daily.  30 tablet  6  . ferrous sulfate 325 (65 FE) MG tablet Take 325 mg by mouth daily with breakfast.      . Iron-Vitamins (GERITOL) LIQD Take 5 mLs by mouth every other day.      . losartan-hydrochlorothiazide (HYZAAR) 100-25 MG per tablet Take 1 tablet by mouth daily.        Marland Kitchen omeprazole (PRILOSEC) 20 MG capsule Take 20 mg by mouth 2 (two) times daily.       . simvastatin (ZOCOR) 20 MG tablet Take 20 mg by mouth at bedtime.       . traMADol (ULTRAM) 50 MG tablet Take 50 mg by mouth every 6 (six) hours as needed. For pain. Maximum dose= 8 tablets per day        Allergies  Allergen Reactions  . Hydrocodone     Shortness of breath and leg cramps  . Imdur (Isosorbide Mononitrate) Nausea Only    Headache  . Lisinopril     REACTION: Cough  . Topiramate     Shortness of breath and leg cramps    Past Medical History  Diagnosis Date  . GERD (gastroesophageal reflux disease)   . HLD (hyperlipidemia)   . HTN (hypertension)   . Tobacco abuse   . Cough secondary to angiotensin converting enzyme inhibitor (ACE-I)   . Rotator cuff injury   . Angina   . Peripheral vascular disease     . CAD (coronary artery disease)     A. Negative MV 04/2010;  B. Inadequate Stress Echo 05/2011;  C. 05/29/11 Cath: LM-NL, LAD-42m, LCX- 40p, OM1 small 192m, OM2 90, RCA 40-50p, 33m, EF 55-65% - Med Rx   . Iron deficiency anemia     Past Surgical History  Procedure Date  . Iliac artery aneurysm repair   . Circumcision   . Pr vein bypass graft,aorto-fem-pop 10/01/2009    left below knee popliteal artery to posterior tibial artery  . Cardiac catheterization     Family History  Problem Relation Age of Onset  . Breast cancer Mother     deceased  . Cancer Mother   . Hyperlipidemia Mother   . Hypertension Mother   . COPD Father     deceased  . Cancer Father   . Hyperlipidemia Father   . Hypertension Father   . Hypertension Sister   . Cancer Brother   . Hypertension Brother   . Hypertension Daughter   . Hypertension Son     History   Social History  . Marital Status: Widowed    Spouse  Name: N/A    Number of Children: N/A  . Years of Education: N/A   Occupational History  . Not on file.   Social History Main Topics  . Smoking status: Former Smoker -- 1.0 packs/day for 55 years    Types: Cigarettes    Quit date: 05/26/2011  . Smokeless tobacco: Never Used  . Alcohol Use: No  . Drug Use: No  . Sexually Active: Not on file   Other Topics Concern  . Not on file   Social History Narrative   Single. Retired.     ROS: Please see the HPI.  All other systems reviewed and negative.  PHYSICAL EXAM:  BP 118/70  Pulse 49  Ht 5\' 11"  (1.803 m)  Wt 226 lb (102.513 kg)  BMI 31.52 kg/m2  General: Well developed, well nourished, in no acute distress. Head:  Normocephalic and atraumatic. Neck: no JVD Lungs: Clear to auscultation and percussion. Heart: Normal S1 and S2.  No murmur, rubs or gallops.  Abdomen:  Normal bowel sounds; soft; non tender; no organomegaly Pulses: Pulses normal in all 4 extremities. Extremities: No clubbing or cyanosis. No edema. Neurologic:  Alert and oriented x 3.  EKG:  Marked sinus bradycardia.   ASSESSMENT AND PLAN:

## 2012-01-30 NOTE — Assessment & Plan Note (Signed)
This is managed by Dr. Nehemiah Settle

## 2012-01-30 NOTE — Assessment & Plan Note (Signed)
This is currently well controlled 

## 2012-01-30 NOTE — Assessment & Plan Note (Signed)
He seems to be quite a bit better today. I congratulated on his discontinuation of smoking, and potentially this is been a difference. Given his overall situation, he would be best treated medically at the present time. He is a chronically low MCV. Dual antiplatelet therapy for not ideal situation would be best handled medically. We will continue to follow him up regularly.

## 2012-02-02 ENCOUNTER — Telehealth: Payer: Self-pay

## 2012-02-02 DIAGNOSIS — M79609 Pain in unspecified limb: Secondary | ICD-10-CM

## 2012-02-02 DIAGNOSIS — I739 Peripheral vascular disease, unspecified: Secondary | ICD-10-CM

## 2012-02-02 NOTE — Telephone Encounter (Signed)
Called pt. Back to triage his symptoms.  Stated that last Wednesday(10/16) he was working on a car and started having pain in knees.  Since then, reports having a "funny feeling in legs"  States "I can't describe it."  States pain improves when he  moves around.  Denies numbness. Denies any temperature change.  Denies swelling, but states "the left leg has been bigger than the right since surgery"; denies any increase in size of left leg.  States has pain in both legs with left worse than right.   States that his left foot has a sensation like it feels wet sometimes, and it is dry when he checks.   States had a physical recently, and "everything checked-out".   Discussed w/ Dr. Edilia Bo.  Recommends scheduling pt. For ABI's and to see the Nurse Practitioner w/in the next week.

## 2012-02-02 NOTE — Telephone Encounter (Signed)
Message copied by Phillips Odor on Wed Feb 02, 2012  4:55 PM ------      Message from: Marcellus Scott      Created: Tue Feb 01, 2012  3:01 PM      Contact: [phone]       Jaston Havens called to see if he could come in sometime this week. His leg is hurting him. He said it is not swollen.            Thanks      Revonda Standard

## 2012-02-03 ENCOUNTER — Telehealth: Payer: Self-pay | Admitting: Vascular Surgery

## 2012-02-03 NOTE — Telephone Encounter (Signed)
I scheduled an appt for this pt on Friday 02/04/12 at 11am for vascular lab and to see NP at 11:40am. Pt is aware of this appt. awt

## 2012-02-04 ENCOUNTER — Ambulatory Visit: Payer: Medicare Other | Admitting: Neurosurgery

## 2012-02-22 ENCOUNTER — Encounter: Payer: Self-pay | Admitting: Neurosurgery

## 2012-02-23 ENCOUNTER — Encounter: Payer: Self-pay | Admitting: Neurosurgery

## 2012-02-23 ENCOUNTER — Encounter (INDEPENDENT_AMBULATORY_CARE_PROVIDER_SITE_OTHER): Payer: Medicare Other | Admitting: *Deleted

## 2012-02-23 ENCOUNTER — Ambulatory Visit (INDEPENDENT_AMBULATORY_CARE_PROVIDER_SITE_OTHER): Payer: Medicare Other | Admitting: Neurosurgery

## 2012-02-23 VITALS — BP 150/82 | HR 49 | Ht 71.0 in | Wt 228.0 lb

## 2012-02-23 DIAGNOSIS — I70219 Atherosclerosis of native arteries of extremities with intermittent claudication, unspecified extremity: Secondary | ICD-10-CM

## 2012-02-23 DIAGNOSIS — Z48812 Encounter for surgical aftercare following surgery on the circulatory system: Secondary | ICD-10-CM

## 2012-02-23 DIAGNOSIS — I739 Peripheral vascular disease, unspecified: Secondary | ICD-10-CM

## 2012-02-23 DIAGNOSIS — M79609 Pain in unspecified limb: Secondary | ICD-10-CM

## 2012-02-23 NOTE — Progress Notes (Signed)
[facility] Office Note  CC: PVD surveillance Referring Physician: Edilia Bo  History of Present Illness: 71 year old male patient of Dr. Adele Dan with a history of a left below the knee popliteal posterior tibial artery bypass in 2011. The patient's also had left iliac intervention at [facility]. The patient denies claudication, rest pain or open ulcerations on the lower extremities. The patient states he did have some leg pain over a week ago when he called to make this appointment which is totally resolved and he does not contribute to his vascular problems.  Past Medical History  Diagnosis Date  . GERD (gastroesophageal reflux disease)   . HLD (hyperlipidemia)   . HTN (hypertension)   . Tobacco abuse   . Cough secondary to angiotensin converting enzyme inhibitor (ACE-I)   . Rotator cuff injury   . Angina   . Peripheral vascular disease   . CAD (coronary artery disease)     A. Negative MV 04/2010;  B. Inadequate Stress Echo 05/2011;  C. 05/29/11 Cath: LM-NL, LAD-18m, LCX- 40p, OM1 small 16m, OM2 90, RCA 40-50p, 63m, EF 55-65% - Med Rx   . Iron deficiency anemia   . DVT (deep venous thrombosis)     ROS: [x]  Positive   [ ]  Denies    General: [ ]  Weight loss, [ ]  Fever, [ ]  chills Neurologic: [ ]  Dizziness, [ ]  Blackouts, [ ]  Seizure [ ]  Stroke, [ ]  "Mini stroke", [ ]  Slurred speech, [ ]  Temporary blindness; [ ]  weakness in arms or legs, [ ]  Hoarseness Cardiac: [ ]  Chest pain/pressure, [ ]  Shortness of breath at rest [ ]  Shortness of breath with exertion, [ ]  Atrial fibrillation or irregular heartbeat Vascular: [ ]  Pain in legs with walking, [ ]  Pain in legs at rest, [ ]  Pain in legs at night,  [ ]  Non-healing ulcer, [ ]  Blood clot in vein/DVT,   Pulmonary: [ ]  Home oxygen, [ ]  Productive cough, [ ]  Coughing up blood, [ ]  Asthma,  [ ]  Wheezing Musculoskeletal:  [ ]  Arthritis, [ ]  Low back pain, [ ]  Joint pain Hematologic: [ ]  Easy Bruising, [ ]   Anemia; [ ]  Hepatitis Gastrointestinal: [ ]  Blood in stool, [ ]  Gastroesophageal Reflux/heartburn, [ ]  Trouble swallowing Urinary: [ ]  chronic Kidney disease, [ ]  on HD - [ ]  MWF or [ ]  TTHS, [ ]  Burning with urination, [ ]  Difficulty urinating Skin: [ ]  Rashes, [ ]  Wounds Psychological: [ ]  Anxiety, [ ]  Depression   Social History History  Substance Use Topics  . Smoking status: Former Smoker -- 1.0 packs/day for 55 years    Types: Cigarettes    Quit date: 05/26/2011  . Smokeless tobacco: Never Used  . Alcohol Use: No    Family History Family History  Problem Relation Age of Onset  . Breast cancer Mother     deceased  . Cancer Mother   . Hyperlipidemia Mother   . Hypertension Mother   . COPD Father     deceased  . Cancer Father   . Hyperlipidemia Father   . Hypertension Father   . Hypertension Sister   . Cancer Brother   . Hypertension Brother   . Hypertension Daughter   . Hypertension Son     Allergies  Allergen Reactions  . Hydrocodone     Shortness of breath and leg cramps  . Imdur (Isosorbide Mononitrate) Nausea Only    Headache  . Lisinopril  REACTION: Cough  . Topiramate     Shortness of breath and leg cramps    Current Outpatient Prescriptions  Medication Sig Dispense Refill  . albuterol (PROVENTIL HFA;VENTOLIN HFA) 108 (90 BASE) MCG/ACT inhaler Inhale 1-2 puffs into the lungs every 6 (six) hours as needed. For shortness of breath       . amLODipine (NORVASC) 5 MG tablet Take 1 tablet (5 mg total) by mouth daily.  30 tablet  6  . aspirin 81 MG tablet Take 162 mg by mouth 2 (two) times daily.       Marland Kitchen atenolol (TENORMIN) 25 MG tablet Take 1 tablet (25 mg total) by mouth daily.  30 tablet  6  . ferrous sulfate 325 (65 FE) MG tablet Take 325 mg by mouth daily with breakfast.      . Iron-Vitamins (GERITOL) LIQD Take 5 mLs by mouth every other day.      . losartan-hydrochlorothiazide (HYZAAR) 100-25 MG per tablet Take 1 tablet by mouth daily.        Marland Kitchen  omeprazole (PRILOSEC) 20 MG capsule Take 20 mg by mouth 2 (two) times daily.       . simvastatin (ZOCOR) 20 MG tablet Take 20 mg by mouth at bedtime.       . traMADol (ULTRAM) 50 MG tablet Take 50 mg by mouth every 6 (six) hours as needed. For pain. Maximum dose= 8 tablets per day        Physical Examination  Filed Vitals:   02/23/12 1136  BP: 150/82  Pulse: 49    Body mass index is 31.80 kg/(m^2).  General:  WDWN in NAD Gait: Normal HEENT: WNL Eyes: Pupils equal Pulmonary: normal non-labored breathing , without Rales, rhonchi,  wheezing Cardiac: RRR, without  Murmurs, rubs or gallops; No carotid bruits Abdomen: soft, NT, no masses Skin: no rashes, ulcers noted Vascular Exam/Pulses: Palpable PT and DP pulses bilateral  Extremities without ischemic changes, no Gangrene , no cellulitis; no open wounds;  Musculoskeletal: no muscle wasting or atrophy  Neurologic: A&O X 3; Appropriate Affect ; SENSATION: normal; MOTOR FUNCTION:  moving all extremities equally. Speech is fluent/normal  Non-Invasive Vascular Imaging: ABIs today are 0.96 and monophasic to biphasic on the right, 1.17 and triphasic to biphasic on the left  ASSESSMENT/PLAN: The patient will followup in one year with repeat ABIs and duplex of his bypass, he is in agreement with this plan, his questions were encouraged and answered.  Lauree Chandler ANP  Clinic M.D.: Edilia Bo

## 2012-02-24 NOTE — Addendum Note (Signed)
Addended by: Sharee Pimple on: 02/24/2012 10:33 AM   Modules accepted: Orders

## 2012-02-28 NOTE — Telephone Encounter (Signed)
This was entered in error/awt

## 2012-04-12 HISTORY — PX: CORONARY ANGIOPLASTY WITH STENT PLACEMENT: SHX49

## 2012-05-01 ENCOUNTER — Ambulatory Visit: Payer: Medicare Other | Admitting: Cardiology

## 2012-05-05 ENCOUNTER — Other Ambulatory Visit (HOSPITAL_COMMUNITY): Payer: Self-pay | Admitting: Urology

## 2012-05-05 DIAGNOSIS — C61 Malignant neoplasm of prostate: Secondary | ICD-10-CM

## 2012-05-18 ENCOUNTER — Encounter (HOSPITAL_COMMUNITY): Payer: Self-pay | Admitting: Emergency Medicine

## 2012-05-18 ENCOUNTER — Emergency Department (HOSPITAL_COMMUNITY)
Admission: EM | Admit: 2012-05-18 | Discharge: 2012-05-18 | Disposition: A | Discharge status: Home / Self Care | Payer: Medicare Other | Attending: Emergency Medicine | Admitting: Emergency Medicine

## 2012-05-18 ENCOUNTER — Emergency Department (HOSPITAL_COMMUNITY): Payer: Medicare Other

## 2012-05-18 DIAGNOSIS — Z8679 Personal history of other diseases of the circulatory system: Secondary | ICD-10-CM | POA: Insufficient documentation

## 2012-05-18 DIAGNOSIS — Z7982 Long term (current) use of aspirin: Secondary | ICD-10-CM | POA: Insufficient documentation

## 2012-05-18 DIAGNOSIS — R079 Chest pain, unspecified: Secondary | ICD-10-CM

## 2012-05-18 DIAGNOSIS — Z86718 Personal history of other venous thrombosis and embolism: Secondary | ICD-10-CM | POA: Insufficient documentation

## 2012-05-18 DIAGNOSIS — D509 Iron deficiency anemia, unspecified: Secondary | ICD-10-CM | POA: Diagnosis present

## 2012-05-18 DIAGNOSIS — R51 Headache: Secondary | ICD-10-CM | POA: Insufficient documentation

## 2012-05-18 DIAGNOSIS — Z87891 Personal history of nicotine dependence: Secondary | ICD-10-CM | POA: Insufficient documentation

## 2012-05-18 DIAGNOSIS — R5381 Other malaise: Secondary | ICD-10-CM | POA: Insufficient documentation

## 2012-05-18 DIAGNOSIS — M25519 Pain in unspecified shoulder: Secondary | ICD-10-CM | POA: Diagnosis present

## 2012-05-18 DIAGNOSIS — I2510 Atherosclerotic heart disease of native coronary artery without angina pectoris: Secondary | ICD-10-CM | POA: Insufficient documentation

## 2012-05-18 DIAGNOSIS — Z87828 Personal history of other (healed) physical injury and trauma: Secondary | ICD-10-CM | POA: Insufficient documentation

## 2012-05-18 DIAGNOSIS — E785 Hyperlipidemia, unspecified: Secondary | ICD-10-CM | POA: Diagnosis present

## 2012-05-18 DIAGNOSIS — R5383 Other fatigue: Secondary | ICD-10-CM | POA: Insufficient documentation

## 2012-05-18 DIAGNOSIS — Z79899 Other long term (current) drug therapy: Secondary | ICD-10-CM | POA: Insufficient documentation

## 2012-05-18 DIAGNOSIS — K219 Gastro-esophageal reflux disease without esophagitis: Secondary | ICD-10-CM | POA: Diagnosis present

## 2012-05-18 DIAGNOSIS — Z9861 Coronary angioplasty status: Secondary | ICD-10-CM | POA: Insufficient documentation

## 2012-05-18 DIAGNOSIS — I10 Essential (primary) hypertension: Secondary | ICD-10-CM | POA: Diagnosis present

## 2012-05-18 DIAGNOSIS — I2089 Other forms of angina pectoris: Secondary | ICD-10-CM | POA: Diagnosis present

## 2012-05-18 DIAGNOSIS — I208 Other forms of angina pectoris: Secondary | ICD-10-CM | POA: Diagnosis present

## 2012-05-18 LAB — CBC
HCT: 38.1 % — ABNORMAL LOW (ref 39.0–52.0)
Hemoglobin: 12.2 g/dL — ABNORMAL LOW (ref 13.0–17.0)
MCH: 22.5 pg — ABNORMAL LOW (ref 26.0–34.0)
MCHC: 32 g/dL (ref 30.0–36.0)
MCV: 70.2 fL — ABNORMAL LOW (ref 78.0–100.0)
Platelets: 160 10*3/uL (ref 150–400)
RBC: 5.43 MIL/uL (ref 4.22–5.81)
RDW: 16.2 % — ABNORMAL HIGH (ref 11.5–15.5)
WBC: 4.6 10*3/uL (ref 4.0–10.5)

## 2012-05-18 LAB — BASIC METABOLIC PANEL
BUN: 14 mg/dL (ref 6–23)
CO2: 29 mEq/L (ref 19–32)
Calcium: 9.4 mg/dL (ref 8.4–10.5)
Chloride: 100 mEq/L (ref 96–112)
Creatinine, Ser: 1.21 mg/dL (ref 0.50–1.35)
GFR calc Af Amer: 67 mL/min — ABNORMAL LOW (ref >90)
GFR calc non Af Amer: 58 mL/min — ABNORMAL LOW (ref >90)
Glucose, Bld: 108 mg/dL — ABNORMAL HIGH (ref 70–99)
Potassium: 4.1 mEq/L (ref 3.5–5.1)
Sodium: 136 mEq/L (ref 135–145)

## 2012-05-18 LAB — TROPONIN I
Troponin I: <0.3 ng/mL (ref <0.30)
Troponin I: <0.3 ng/mL (ref <0.30)
Troponin I: <0.3 ng/mL (ref <0.30)

## 2012-05-18 MED ORDER — NITROGLYCERIN 0.4 MG SL SUBL: 0.4000 mg | SUBLINGUAL_TABLET | SUBLINGUAL | Status: DC | PRN

## 2012-05-18 MED ORDER — ASPIRIN 81 MG PO CHEW
324.0000 mg | CHEWABLE_TABLET | Freq: Once | ORAL | Status: AC
  Administered 2012-05-18: 324 mg via ORAL
  Filled 2012-05-18: qty 4

## 2012-05-18 MED ORDER — OXYCODONE-ACETAMINOPHEN 5-325 MG PO TABS
1.0000 | ORAL_TABLET | Freq: Once | ORAL | Status: AC
  Administered 2012-05-18: 1 via ORAL
  Filled 2012-05-18: qty 1

## 2012-05-18 MED ORDER — TRAMADOL HCL 50 MG PO TABS: ORAL_TABLET | ORAL | Status: DC

## 2012-05-18 NOTE — ED Notes (Signed)
The said md stucky did go in and look at vessels and had some build up but did not put in a stent last year.

## 2012-05-18 NOTE — ED Notes (Signed)
Pt states took nitro about 1100 this morning.

## 2012-05-18 NOTE — ED Notes (Signed)
Pt states that yesterday he was having chest pain and lt shoulder pain states that he has intermit dizzyness. Yesterday took a nitro he had a small amount of change. Today he just has lt shoulder pain and some sob intermit.

## 2012-05-18 NOTE — ED Provider Notes (Signed)
History     CSN: [id number]  Arrival date & time 05/18/12  1201   First MD Initiated Contact with Patient 05/18/12 1258      Chief Complaint  Patient presents with  . Headache  . Chest Pain    (Consider location/radiation/quality/duration/timing/severity/associated sxs/prior treatment) HPI Pt presenting with c/o left shoulder pain.  Pt states he was seated on his bed earlier today when he had acute onset of pain in left shoulder and upper chest. Pt states pain is worse with some movements of his arm.  Radiates down to his chest. He states he had a rotator cuff injury several years ago and has not had any treatment for this.  Pain lasted approx 4-5 hours.  No sob, no nausea, no diaphoresis.  States he did feel weak all over with the intense pain.  He took a nitroglycerin without much relief.  Then developed a headache.  No leg swelling, no true exertional component of pain.  Pt has hx of CAD which is being medically managed now.  No stents.  Does follow with [facility].  No fever/chills no cough.  Movement and palpation does make pain worse in anterior and posterior left shoulder.  There are no other associated systemic symptoms, there are no other alleviating or modifying factors.   Past Medical History  Diagnosis Date  . GERD (gastroesophageal reflux disease)   . HLD (hyperlipidemia)   . HTN (hypertension)   . Tobacco abuse   . Cough secondary to angiotensin converting enzyme inhibitor (ACE-I)   . Rotator cuff injury   . Angina   . Peripheral vascular disease   . CAD (coronary artery disease)     A. Negative MV 04/2010;  B. Inadequate Stress Echo 05/2011;  C. 05/29/11 Cath: LM-NL, LAD-57m, LCX- 40p, OM1 small 133m, OM2 90, RCA 40-50p, 57m, EF 55-65% - Med Rx   . Iron deficiency anemia   . DVT (deep venous thrombosis)     Past Surgical History  Procedure Date  . Iliac artery aneurysm repair   . Circumcision   . Pr vein bypass graft,aorto-fem-pop 10/01/2009    left below knee  popliteal artery to posterior tibial artery  . Cardiac catheterization     Family History  Problem Relation Age of Onset  . Breast cancer Mother     deceased  . Cancer Mother   . Hyperlipidemia Mother   . Hypertension Mother   . COPD Father     deceased  . Cancer Father   . Hyperlipidemia Father   . Hypertension Father   . Hypertension Sister   . Cancer Brother   . Hypertension Brother   . Hypertension Daughter   . Hypertension Son     History  Substance Use Topics  . Smoking status: Former Smoker -- 1.0 packs/day for 55 years    Types: Cigarettes    Quit date: 05/26/2011  . Smokeless tobacco: Never Used  . Alcohol Use: No      Review of Systems ROS reviewed and all otherwise negative except for mentioned in HPI  Allergies  Hydrocodone; Imdur; Lisinopril; and Topiramate  Home Medications   Current Outpatient Rx  Name  Route  Sig  Dispense  Refill  . ALBUTEROL SULFATE HFA 108 (90 BASE) MCG/ACT IN AERS   Inhalation   Inhale 2 puffs into the lungs every 6 (six) hours as needed. Wheezing and shortness of breath         . AMLODIPINE BESYLATE 5 MG PO TABS  Oral   Take 5 mg by mouth daily before breakfast.         . ASPIRIN 81 MG PO TABS   Oral   Take 162 mg by mouth 2 (two) times daily.          . ATENOLOL 25 MG PO TABS   Oral   Take 25 mg by mouth daily before breakfast.         . FERROUS SULFATE 325 (65 FE) MG PO TABS   Oral   Take 325 mg by mouth daily with breakfast.         . GERITOL TONIC PO LIQD   Oral   Take 5 mLs by mouth every other day.         Marland Kitchen LOSARTAN POTASSIUM-HCTZ 100-25 MG PO TABS   Oral   Take 1 tablet by mouth daily before breakfast.          . OMEPRAZOLE 20 MG PO CPDR   Oral   Take 20 mg by mouth 2 (two) times daily.          Marland Kitchen SIMVASTATIN 20 MG PO TABS   Oral   Take 20 mg by mouth at bedtime.          . TRAMADOL HCL 50 MG PO TABS   Oral   Take 50 mg by mouth every 6 (six) hours as needed. For pain.  Maximum dose= 8 tablets per day         . TRAMADOL HCL 50 MG PO TABS      Take 1 or 2 po Q 6hrs for pain   16 tablet   0     BP 148/70  Pulse 56  Temp 97.4 F (36.3 C) (Oral)  Resp 18  SpO2 98% Vitals reviewed Physical Exam Physical Examination: General appearance - alert, well appearing, and in no distress Mental status - alert, oriented to person, place, and time Eyes - no scleral icterus, no conjunctival injection Mouth - mucous membranes moist, pharynx normal without lesions Chest - clear to auscultation, no wheezes, rales or rhonchi, symmetric air entry Heart - normal rate, regular rhythm, normal S1, S2, no murmurs, rubs, clicks or gallops Abdomen - soft, nontender, nondistended, no masses or organomegaly Musculoskeletal - ttp over anterior left shoulder,some pain with active and passive ROM, otherwise  no joint tenderness, deformity or swelling Extremities - peripheral pulses normal, no pedal edema, no clubbing or cyanosis Skin - normal coloration and turgor, no rashes  ED Course  Procedures (including critical care time)   Date: 05/18/2012  Rate: 53  Rhythm: sinus brady with first degree AV block  QRS Axis: normal  Intervals: normal  ST/T Wave abnormalities: normal  Conduction Disutrbances:none  Narrative Interpretation:   Old EKG Reviewed: unchanged compared to prior ekg of 09/15/11  2:38 PM  D/w [facility], they will come to see the patient in the ED.  Disposition based on their recommendations.  ACS versus rotator cuff pain.   4:46 PM  D/w cardiology, rhonda Barrett.  If troponin at 6:30 is negative, pt can be discharge.  Stress test scheduled for Monday Feb 10.     Labs Reviewed  CBC - Abnormal; Notable for the following:    Hemoglobin 12.2 (*)     HCT 38.1 (*)     MCV 70.2 (*)     MCH 22.5 (*)     RDW 16.2 (*)     All other components within normal limits  BASIC METABOLIC  PANEL - Abnormal; Notable for the following:    Glucose, Bld 108 (*)      GFR calc non Af Amer 58 (*)     GFR calc Af Amer 67 (*)     All other components within normal limits  TROPONIN I  TROPONIN I  TROPONIN I  LAB REPORT - SCANNED   Dg Chest 2 View  05/18/2012  *RADIOLOGY REPORT*  Clinical Data: Left-sided chest and shoulder pain.  History of smoking and hypertension.  CHEST - 2 VIEW  Comparison: 09/15/2011 plain film and CT.  Findings: Moderate mid thoracic spondylosis. Midline trachea. Normal heart size and mediastinal contours for age.  No pleural effusion or pneumothorax.  Clear lungs.  IMPRESSION: No acute cardiopulmonary disease.   Original Report Authenticated By: Jeronimo Greaves, M.D.      1. Chest pain, unspecified       MDM  Pt with hx of CAD and rotator cuff injury presents with pain in left upper chest and left shoulder.  Clinically pain seems most c/w musculoskeletal shoulder pain, however with hx of CAD- [facility]ology consulted to aid with dispo.  Pt is pain free in the ED. Dispo will be per their recommendations.          Ethelda Chick, MD 05/19/12 [phone]

## 2012-05-18 NOTE — ED Notes (Signed)
Patient transported to X-ray 

## 2012-05-18 NOTE — Consult Note (Signed)
History and Physical   [name] MRN: [id number], DOB/AGE: 15-Sep-1940 72 y.o. Date of Encounter: 05/18/2012  Primary Physician: Katy Apo, MD Primary Cardiologist: TS  Chief Complaint:  Chest pain  HPI:  Joel [name] is a 72 year old male with a history of coronary artery disease. He was last cathed in February of 2013 and medical therapy was recommended. He has done well since then but has occasional episodes of left shoulder pain.  Today Joel [name] was in his usual state of health. At 09:45 AM, he was sitting still when he had sudden onset of left shoulder pain that was over his whole shoulder. Initially, he took nothing for it. The symptoms were not clearly associated with shortness of breath, nausea vomiting or diaphoresis. He thinks the symptoms were a little bit improved by certain positions of his arm. He acknowledges he has a left rotator cuff injury and thought the pain was from this. He pulled a battery out of a car, and when he got back to the house, he felt bad all over. He also felt weak in the knees. His symptoms were concerning to him so he took a sublingual March glycerin. He thinks this helped a little bit. When his symptoms did not resolve, he came to the emergency room. In the emergency room, he was given aspirin 81 mg x4 and a Percocet 5 mg tablet. His symptoms have greatly improved and he does not feel the pain at all now.   He does not routinely get any exertional symptoms. He has been seen in the emergency room a couple of times in the last year for shortness of breath with or without chest pain but has not been admitted. He denies any recent change in his activity level her ability to exert himself. He can walk up a flight of steps without stopping and without feeling short of breath. The major limiting factor in his activity level is peripheral vascular disease and lower extremity pain.  Past Medical History  Diagnosis Date  . GERD  (gastroesophageal reflux disease)   . HLD (hyperlipidemia)   . HTN (hypertension)   . Tobacco abuse   . Cough secondary to angiotensin converting enzyme inhibitor (ACE-I)   . Rotator cuff injury   . Angina   . Peripheral vascular disease   . CAD (coronary artery disease)     A. Negative MV 04/2010;  B. Inadequate Stress Echo 05/2011;  C. 05/29/11 Cath: LM-NL, LAD-80m, LCX- 40p, OM1 small 155m, OM2 90, RCA 40-50p, 58m, EF 55-65% - Med Rx   . Iron deficiency anemia   . DVT (deep venous thrombosis)      Surgical History:  Past Surgical History  Procedure Date  . Iliac artery aneurysm repair   . Circumcision   . Pr vein bypass graft,aorto-fem-pop 10/01/2009    left below knee popliteal artery to posterior tibial artery  . Cardiac catheterization      I have reviewed the patient's current medications. Medication Sig  albuterol (PROVENTIL HFA;VENTOLIN HFA) 108 (90 BASE) MCG/ACT inhaler Inhale 2 puffs into the lungs every 6 (six) hours as needed. Wheezing and shortness of breath  amLODipine (NORVASC) 5 MG tablet Take 5 mg by mouth daily before breakfast.  aspirin 81 MG tablet Take 162 mg by mouth 2 (two) times daily.   atenolol (TENORMIN) 25 MG tablet Take 25 mg by mouth daily before breakfast.  ferrous sulfate 325 (65 FE) MG tablet Take 325 mg by mouth daily with breakfast.  Iron-Vitamins (GERITOL) LIQD Take 5 mLs by mouth every other day.  losartan-hydrochlorothiazide (HYZAAR) 100-25 MG per tablet Take 1 tablet by mouth daily before breakfast.   omeprazole (PRILOSEC) 20 MG capsule Take 20 mg by mouth 2 (two) times daily.   simvastatin (ZOCOR) 20 MG tablet Take 20 mg by mouth at bedtime.   traMADol (ULTRAM) 50 MG tablet Take 50 mg by mouth every 6 (six) hours as needed. For pain. Maximum dose= 8 tablets per day    Allergies:  Allergies  Allergen Reactions  . Hydrocodone     Shortness of breath and leg cramps  . Imdur (Isosorbide Mononitrate) Nausea Only    Headache  . Lisinopril       REACTION: Cough  . Topiramate     Shortness of breath and leg cramps    History   Social History  . Marital Status: Widowed    Spouse Name: N/A    Number of Children: N/A  . Years of Education: N/A   Occupational History  . Retired    Social History Main Topics  . Smoking status: Former Smoker -- 1.0 packs/day for 55 years    Types: Cigarettes    Quit date: 05/26/2011  . Smokeless tobacco: Never Used  . Alcohol Use: No  . Drug Use: No  . Sexually Active: Not on file   Other Topics Concern  . 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    Family History  Problem Relation Age of Onset  . Breast cancer Mother     deceased  . Cancer Mother   . Hyperlipidemia Mother   . Hypertension Mother   . COPD Father     deceased  . Cancer Father   . Hyperlipidemia Father   . Hypertension Father   . Hypertension Sister   . Cancer Brother   . Hypertension Brother   . Hypertension Daughter   . Hypertension Son    Family Status  Relation Status Death Age  . Mother Deceased 29  . Father Deceased 17    smoker  . Sister Alive     good health  . Brother Deceased 7 month  . Brother Deceased 64  . Brother Alive     good health  . Brother Alive     good health  . Brother Alive     good health  . Sister Alive     good health  . Sister Alive     good health    Review of Systems:  He has chronic musculoskeletal aches and pains. He takes iron for his anemia is compliant with this. He has no hematuria or melena or bright red blood per 10. He denies any GI symptoms. He has some chronic dyspnea on exertion that has not changed recently. He also has lower extremity pain with ambulation but not resting pain. Full 14-point review of systems otherwise negative except as noted above.   Physical Exam: Blood pressure 148/70, pulse 56, temperature 97.4 F (36.3 C), temperature source Oral, resp. rate 18, SpO2 98.00%. General: Well  developed, well nourished, male in no acute distress. Head: Normocephalic, atraumatic, sclera non-icteric, no xanthomas, nares are without discharge. Dentition: poor Neck:  No carotid bruits. JVD not elevated. No thyromegally Lungs: Good expansion bilaterally. without wheezes or rhonchi. Some rales in bases, good air exchange Heart: Regular rate and rhythm with S1 S2.  No S3 or S4.  No murmur, no rubs, or  gallops appreciated. Abdomen: Soft, non-tender, non-distended with normoactive bowel sounds. No hepatomegaly. No rebound/guarding. No obvious abdominal masses. Msk:  Strength and tone appear normal for age. No joint deformities or effusions, no spine or costo-vertebral angle tenderness. Extremities: No clubbing or cyanosis. No edema.  Distal pedal pulses are 2+ in bilateral upper extrem, decreased in both lower extrem Neuro: Alert and oriented X 3. Moves all extremities spontaneously. No focal deficits noted. Psych:  Responds to questions appropriately with a normal affect. Skin: No rashes or lesions noted  Labs:   Lab Results  Component Value Date   WBC 4.6 05/18/2012   HGB 12.2* 05/18/2012   HCT 38.1* 05/18/2012   MCV 70.2* 05/18/2012   PLT 160 05/18/2012   No results found for this basename: INR in the last 72 hours   Lab 05/18/12 1320  NA 136  K 4.1  CL 100  CO2 29  BUN 14  CREATININE 1.21  CALCIUM 9.4  PROT --  BILITOT --  ALKPHOS --  ALT --  AST --  GLUCOSE 108*    Basename 05/18/12 1320  CKTOTAL --  CKMB --  TROPONINI <0.30    Radiology/Studies:  Dg Chest 2 View 05/18/2012  *RADIOLOGY REPORT*  Clinical Data: Left-sided chest and shoulder pain.  History of smoking and hypertension.  CHEST - 2 VIEW  Comparison: 09/15/2011 plain film and CT.  Findings: Moderate mid thoracic spondylosis. Midline trachea. Normal heart size and mediastinal contours for age.  No pleural effusion or pneumothorax.  Clear lungs.  IMPRESSION: No acute cardiopulmonary disease.   Original Report  Authenticated By: Jeronimo Greaves, M.D.      Cardiac Cath: 05/29/2011 Left mainstem: No significant obstruction  Left anterior descending (LAD): Large and long with significant ectasia. 50% mid stenosis that is eccentric with involvement of small diagonal with 50-60% lesion. Distal LAD wraps the apex and supplies the distal inferior wall.  Left circumflex (LCx): large caliber. Ectasia with small ramus. 40% proximal vessel OM 1 is small, and severely diseased, totally occluded in mid. OM2 has eccentric 90% lesion in moderate size OM2 and collateralizes OM1.  Right coronary artery (RCA): Marked ectasia and large caliber vessel. 40-50% proximal lesion, 70% mid vessel narrowing at acute margin. Diffuse ectasia in distal vessel. Diffuse plaquing of small PDA. Small PLA  Left ventriculography: Left ventricular systolic function is normal, LVEF is estimated at 55-65%, there is trace angiographic mitral regurgitation  Final Conclusions:  1. Preserved LV function  2. Diffuse ectasia  3. Diffuse disease of the RCA  4. Total occlusion of the OM1  5. Significant disease of the OM2   Echo: 08/25/2009 Study Conclusions - Left ventricle: The cavity size was normal. Systolic function was normal. The estimated ejection fraction was in the range of 60% to 65%. - Aortic valve: Mild regurgitation.  ECG: 18-May-2012 12:12:54 [facility] ED ROUTINE RECORD SINUS RHYTHM ~ normal P axis, V-rate 50- 99 FIRST DEGREE AV BLOCK ~ PR >220, V-rate 50- 90 Standard 12 Lead Report ~ Not Confirmed Abnormal ECG 53mm/s 50mm/mV 150Hz  8.0.1 12SL 235 CID: [id number] Referred by: Unconfirmed Vent. rate 53 BPM PR interval 240 ms QRS duration 96 ms QT/QTc 432/406 ms P-R-T axes 68 62 57   ASSESSMENT AND PLAN:  The patient was seen today by Dr Shirlee Latch, the patient evaluated and the data reviewed.  Principal Problem:  *Angina effort - his symptoms started in the setting of some exertion and were possibly made worse  by more exertion  but are not clearly anginal. Prior to his cardiac catheterization in February of 2013, he had left-sided chest pain at rest but has no history of exertional symptoms. His ECG is not acute and his initial enzymes are negative. The duration of the pain was approximately 5 hours. M.D. advise on rechecking cardiac enzymes, possibly discharge if they remain negative versus admitting to observation to rule out MI with medical therapy if enzymes remain negative and cardiac catheterization if enzymes become elevated.  Otherwise, continue home medications. Active Problems:  HYPERLIPIDEMIA  ANEMIA, IRON DEFICIENCY  HYPERTENSION  PVD  GERD  SHOULDER PAIN, LEFT   Signed,  Rhonda Barrett PA-C 05/18/2012, 4:06 PM  Patient seen with PA, agree with the above note.  Patient developed left shoulder pain, worse when he moved his arm around.  He does not think that anything in particular triggered it.  It lasted about 4 hours and resolved with Percocet in the ER.  He does have a history of CAD with moderate disease that was medically managed on catheterization in 2013.  He had chest pain (different from today) prior to cath.  He does, of note, have left rotator cuff problems.  Initial cardiac enzymes negative and ECG non-acute.  This episode seems non-cardiac.  - Will get a 2nd set of enzymes at 4 hours.  - If these are negative as well, he can go home.  He has followup with Dr. Riley Kill tomorrow and we will arrange Lexiscan Sestamibi on Monday.   Marca Ancona 05/18/2012 4:34 PM

## 2012-05-18 NOTE — ED Notes (Signed)
Pt ambulating independently w/ steady gait on d/c in no acute distress, A&Ox4. D/c instructions reviewed w/ pt and family - pt and family deny any further questions or concerns at present. Rx given x1  

## 2012-05-18 NOTE — ED Provider Notes (Signed)
I introduced myself to the patient and told him I would be following his cardiac enzymes and hopefully he would be discharged. He has been evaluated by [facility] cardiology and is set up to have a stress test done next week.  Patient has 3 negative troponins. He is going to be discharged. He states Dr.Polite has referred him to a specialist about his shoulder, however this time he cannot recall the name of that physician. He's advised to contact Dr.Polite 's office to find out who that was he can be seen again.  Results for orders placed during the hospital encounter of 05/18/12  TROPONIN I      Component Value Range   Troponin I <0.30  <0.30 ng/mL  TROPONIN I      Component Value Range   Troponin I <0.30  <0.30 ng/mL  TROPONIN I      Component Value Range   Troponin I <0.30  <0.30 ng/mL   Diagnoses that have been ruled out:  Final diagnoses:  Chest pain, unspecified   New Prescriptions   TRAMADOL (ULTRAM) 50 MG TABLET    Take 1 or 2 po Q 6hrs for pain   Plan discharge  Devoria Albe, MD, Armando Gang   Ward Givens, MD 05/18/12 [phone]

## 2012-05-19 ENCOUNTER — Ambulatory Visit (INDEPENDENT_AMBULATORY_CARE_PROVIDER_SITE_OTHER): Payer: Medicare Other | Admitting: Cardiology

## 2012-05-19 ENCOUNTER — Encounter: Payer: Self-pay | Admitting: Cardiology

## 2012-05-19 VITALS — BP 132/78 | HR 55 | Ht 71.0 in | Wt 238.8 lb

## 2012-05-19 DIAGNOSIS — R0789 Other chest pain: Secondary | ICD-10-CM

## 2012-05-19 DIAGNOSIS — F17200 Nicotine dependence, unspecified, uncomplicated: Secondary | ICD-10-CM

## 2012-05-19 DIAGNOSIS — I2510 Atherosclerotic heart disease of native coronary artery without angina pectoris: Secondary | ICD-10-CM

## 2012-05-19 DIAGNOSIS — I10 Essential (primary) hypertension: Secondary | ICD-10-CM

## 2012-05-19 DIAGNOSIS — R079 Chest pain, unspecified: Secondary | ICD-10-CM

## 2012-05-19 DIAGNOSIS — D509 Iron deficiency anemia, unspecified: Secondary | ICD-10-CM

## 2012-05-19 NOTE — Assessment & Plan Note (Signed)
Patient symptoms are somewhat atypical. On exam today he still has a little discomfort with elevation of the left shoulder. In regards to his coronary status, he's not had chest pain is characteristic of what he had in the past, and importantly there been no electrocardiographic changes nor have there been positive enzymes. I am reluctant to subject him to another nuclear imaging study as one was done last year. Given the circumflex disease primary locality, I'm is unlikely that this would be dramatically changed. We will do a routine stress test at beginning of the week, and he is to take it easy over the weekend. Should he have more problems he is to come back to the emergency room. At that point he would need repeat catheterization.

## 2012-05-19 NOTE — Progress Notes (Signed)
HPI:  Patient seen today in a followup visit. Cardiac standpoint he is stable today. Yesterday, he went to the emergency room where he had about 5 hours of discomfort. It is located primarily in the shoulder, and motion made it worse. His cardiac enzymes were unremarkable and his exam was negative. His EKG was unchanged. They let him go to follow up in the office today. Importantly, about a year ago he underwent radionuclide imaging which did not demonstrate significant ischemia he has known underlying coronary disease, and I reviewed his cath studies today. He has fairly significant distal circumflex disease, and diffuse disease of the right and distal LAD. We discussed multiple strategies today. He is feeling a lot better.  Current Outpatient Prescriptions  Medication Sig Dispense Refill  . albuterol (PROVENTIL HFA;VENTOLIN HFA) 108 (90 BASE) MCG/ACT inhaler Inhale 2 puffs into the lungs every 6 (six) hours as needed. Wheezing and shortness of breath      . amLODipine (NORVASC) 5 MG tablet Take 5 mg by mouth daily before breakfast.      . aspirin 81 MG tablet Take 162 mg by mouth 2 (two) times daily.       Marland Kitchen atenolol (TENORMIN) 25 MG tablet Take 25 mg by mouth daily before breakfast.      . ferrous sulfate 325 (65 FE) MG tablet Take 325 mg by mouth daily with breakfast.      . Iron-Vitamins (GERITOL) LIQD Take 5 mLs by mouth every other day.      . losartan-hydrochlorothiazide (HYZAAR) 100-25 MG per tablet Take 1 tablet by mouth daily before breakfast.       . omeprazole (PRILOSEC) 20 MG capsule Take 20 mg by mouth 2 (two) times daily.       . simvastatin (ZOCOR) 20 MG tablet Take 20 mg by mouth at bedtime.       . traMADol (ULTRAM) 50 MG tablet Take 50 mg by mouth every 6 (six) hours as needed. For pain. Maximum dose= 8 tablets per day        Allergies  Allergen Reactions  . Hydrocodone     Shortness of breath and leg cramps  . Imdur (Isosorbide Mononitrate) Nausea Only    Headache  .  Lisinopril     REACTION: Cough  . Topiramate     Shortness of breath and leg cramps    Past Medical History  Diagnosis Date  . GERD (gastroesophageal reflux disease)   . HLD (hyperlipidemia)   . HTN (hypertension)   . Tobacco abuse   . Cough secondary to angiotensin converting enzyme inhibitor (ACE-I)   . Rotator cuff injury   . Angina   . Peripheral vascular disease   . CAD (coronary artery disease)     A. Negative MV 04/2010;  B. Inadequate Stress Echo 05/2011;  C. 05/29/11 Cath: LM-NL, LAD-25m, LCX- 40p, OM1 small 177m, OM2 90, RCA 40-50p, 75m, EF 55-65% - Med Rx   . Iron deficiency anemia   . DVT (deep venous thrombosis)     Past Surgical History  Procedure Date  . Iliac artery aneurysm repair   . Circumcision   . Pr vein bypass graft,aorto-fem-pop 10/01/2009    left below knee popliteal artery to posterior tibial artery  . Cardiac catheterization     Family History  Problem Relation Age of Onset  . Breast cancer Mother     deceased  . Cancer Mother   . Hyperlipidemia Mother   . Hypertension Mother   .  COPD Father     deceased  . Cancer Father   . Hyperlipidemia Father   . Hypertension Father   . Hypertension Sister   . Cancer Brother   . Hypertension Brother   . Hypertension Daughter   . Hypertension Son     History   Social History  . Marital Status: Widowed    Spouse Name: N/A    Number of Children: N/A  . Years of Education: N/A   Occupational History  . Retired    Social History Main Topics  . Smoking status: Former Smoker -- 1.0 packs/day for 55 years    Types: Cigarettes    Quit date: 05/26/2011  . Smokeless tobacco: Never Used  . Alcohol Use: No  . Drug Use: No  . Sexually Active: Not on file   Other Topics Concern  . 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    ROS: Please see the HPI.  All other systems reviewed and negative.  PHYSICAL EXAM:  BP 132/78  Pulse 55  Ht 5\' 11"   (1.803 m)  Wt 238 lb 12.8 oz (108.319 kg)  BMI 33.31 kg/m2  SpO2 97%  General: Well developed, well nourished, in no acute distress. Head:  Normocephalic and atraumatic. Neck: no JVD Lungs: Clear to auscultation and percussion. Heart: Normal S1 and S2.  No murmur, rubs or gallops.  Pulses: Pulses normal in all 4 extremities. Extremities: No clubbing or cyanosis. No edema. Neurologic: Alert and oriented x 3.  EKG:   SB with first degee  Cath study 2013      Left mainstem: No significant obstruction  Left anterior descending (LAD): Large and long with significant ectasia. 50% mid stenosis that is eccentric with involvement of small diagonal with 50-60% lesion. Distal LAD wraps the apex and supplies the distal inferior wall.  Left circumflex (LCx): large caliber. Ectasia with small ramus. 40% proximal vessel OM 1 is small, and severely diseased, totally occluded in mid. OM2 has eccentric 90% lesion in moderate size OM2 and collateralizes OM1.  Right coronary artery (RCA): Marked ectasia and large caliber vessel. 40-50% proximal lesion, 70% mid vessel narrowing at acute margin. Diffuse ectasia in distal vessel. Diffuse plaquing of small PDA. Small PLA  Left ventriculography: Left ventricular systolic function is normal, LVEF is estimated at 55-65%, there is trace angiographic mitral regurgitation  Final Conclusions:  1. Preserved LV function  2. Diffuse ectasia  3. Diffuse disease of the RCA  4. Total occlusion of the OM1  5. Significant disease of the OM2  Recommendations:  1. Patient has microcytic anemia with MCV 70, and needs evaluation of this. Options include initial medical therapy with sig risk factor reduction, and antiplatelet therapy if stools negative. OM2 could be stented, but there is somewhat diffuse plaque in multiple vessels. If chest pain continues, the PCI could be done with the caveat that DAPT may be problematic. Will review with Dr. Nehemiah Settle.  [name]    06/01/2011, 9:13 AM    ASSESSMENT AND PLAN:

## 2012-05-19 NOTE — Assessment & Plan Note (Addendum)
MCV remains low.  Hgb stable. Followed by Dr. Nehemiah Settle

## 2012-05-19 NOTE — Patient Instructions (Addendum)
Your physician has requested that you have an exercise tolerance test in 1 WEEK with Dr Riley Kill. For further information please visit [email or web address] Please also follow instruction sheet, as given.  Your physician recommends that you continue on your current medications as directed. Please refer to the Current Medication list given to you today.

## 2012-05-19 NOTE — Assessment & Plan Note (Signed)
Controlled.  

## 2012-05-19 NOTE — Assessment & Plan Note (Signed)
No longer smoking 

## 2012-05-22 ENCOUNTER — Encounter (HOSPITAL_COMMUNITY): Payer: Medicare Other

## 2012-05-23 ENCOUNTER — Ambulatory Visit (INDEPENDENT_AMBULATORY_CARE_PROVIDER_SITE_OTHER): Payer: Medicare Other | Admitting: Cardiology

## 2012-05-23 ENCOUNTER — Encounter: Payer: Self-pay | Admitting: Cardiology

## 2012-05-23 DIAGNOSIS — R079 Chest pain, unspecified: Secondary | ICD-10-CM

## 2012-05-23 NOTE — Progress Notes (Addendum)
Exercise Treadmill Test  Pre-Exercise Testing Evaluation Rhythm: normal sinus  Rate: 47                 Test  Exercise Tolerance Test Ordering MD: [name], MD  Interpreting MD: [name], MD  Unique Test No: 1  Treadmill:  1  Indication for ETT: chest pain - rule out ischemia  Contraindication to ETT: No   Stress Modality: exercise - treadmill  Cardiac Imaging Performed: non   Protocol: standard Bruce - maximal  Max BP:  203/75  Max MPHR (bpm):  148 85% MPR (bpm):  126  MPHR obtained (bpm):  105 % MPHR obtained:  70%  Reached 85% MPHR (min:sec):  NA Total Exercise Time (min-sec):  3:00  Workload in METS:  5 Borg Scale: 15  Reason ETT Terminated:  fatigue    ST Segment Analysis At Rest: normal ST segments - no evidence of significant ST depression With Exercise: no evidence of significant ST depression  Other Information Arrhythmia:  No Angina during ETT:  absent (0) Quality of ETT:  non-diagnostic  ETT Interpretation:  normal - no evidence of ischemia by ST analysis  Comments: Patients exercise was limited by back and knee pain.  He had very low exercise tolerance.  No cardiac symptoms at this low exercise level  Recommendations: Close follow up.  Given known anatomy, I doubt that nuclear imaging will change management for now.  He will remain on medical therapy with close follow up.  He thinks that this is his rotator cuff.  When he moves around it feels better.  It is limited to the shoulder.  No diaphoresis with this.  We will see him back in follow up later in the week to reconsider cath.

## 2012-05-23 NOTE — Progress Notes (Deleted)
   [name], male    DOB: 1940-10-06, 72 y.o.   MRN: [id number]  HPI    Review of Systems    Physical Exam

## 2012-05-23 NOTE — Progress Notes (Deleted)
   [name], male    DOB: 06/26/1940, 71 y.o.   MRN: [id number]  HPI    Review of Systems    Physical Exam      

## 2012-05-25 ENCOUNTER — Encounter (HOSPITAL_COMMUNITY): Payer: Medicare Other

## 2012-05-26 ENCOUNTER — Ambulatory Visit (HOSPITAL_COMMUNITY): Payer: Medicare Other

## 2012-05-26 ENCOUNTER — Ambulatory Visit: Payer: Medicare Other | Admitting: Cardiology

## 2012-05-30 ENCOUNTER — Encounter: Payer: Self-pay | Admitting: Cardiology

## 2012-05-30 ENCOUNTER — Ambulatory Visit (INDEPENDENT_AMBULATORY_CARE_PROVIDER_SITE_OTHER): Payer: Medicare Other | Admitting: Cardiology

## 2012-05-30 ENCOUNTER — Observation Stay (HOSPITAL_COMMUNITY)
Admission: AD | Admit: 2012-05-30 | Discharge: 2012-06-01 | DRG: 249 | Disposition: A | Discharge status: Home / Self Care | Payer: Medicare Other | Source: Ambulatory Visit | Attending: Cardiology | Admitting: Cardiology

## 2012-05-30 ENCOUNTER — Encounter (HOSPITAL_COMMUNITY): Payer: Self-pay | Admitting: General Practice

## 2012-05-30 VITALS — BP 122/70 | HR 46 | Ht 70.0 in | Wt 237.0 lb

## 2012-05-30 DIAGNOSIS — I739 Peripheral vascular disease, unspecified: Secondary | ICD-10-CM | POA: Insufficient documentation

## 2012-05-30 DIAGNOSIS — I2510 Atherosclerotic heart disease of native coronary artery without angina pectoris: Secondary | ICD-10-CM

## 2012-05-30 DIAGNOSIS — Z955 Presence of coronary angioplasty implant and graft: Secondary | ICD-10-CM

## 2012-05-30 DIAGNOSIS — I200 Unstable angina: Secondary | ICD-10-CM

## 2012-05-30 DIAGNOSIS — Z7902 Long term (current) use of antithrombotics/antiplatelets: Secondary | ICD-10-CM | POA: Insufficient documentation

## 2012-05-30 DIAGNOSIS — E785 Hyperlipidemia, unspecified: Secondary | ICD-10-CM

## 2012-05-30 DIAGNOSIS — F17200 Nicotine dependence, unspecified, uncomplicated: Secondary | ICD-10-CM | POA: Insufficient documentation

## 2012-05-30 DIAGNOSIS — R079 Chest pain, unspecified: Secondary | ICD-10-CM

## 2012-05-30 DIAGNOSIS — D696 Thrombocytopenia, unspecified: Secondary | ICD-10-CM | POA: Insufficient documentation

## 2012-05-30 DIAGNOSIS — D509 Iron deficiency anemia, unspecified: Secondary | ICD-10-CM | POA: Diagnosis present

## 2012-05-30 DIAGNOSIS — Z86718 Personal history of other venous thrombosis and embolism: Secondary | ICD-10-CM | POA: Insufficient documentation

## 2012-05-30 DIAGNOSIS — I10 Essential (primary) hypertension: Secondary | ICD-10-CM

## 2012-05-30 HISTORY — DX: Iron deficiency anemia, unspecified: D50.9

## 2012-05-30 LAB — COMPREHENSIVE METABOLIC PANEL
ALT: 18 U/L (ref 0–53)
AST: 16 U/L (ref 0–37)
Albumin: 3.6 g/dL (ref 3.5–5.2)
Alkaline Phosphatase: 68 U/L (ref 39–117)
BUN: 13 mg/dL (ref 6–23)
CO2: 29 mEq/L (ref 19–32)
Calcium: 9.4 mg/dL (ref 8.4–10.5)
Chloride: 101 mEq/L (ref 96–112)
Creatinine, Ser: 1.41 mg/dL — ABNORMAL HIGH (ref 0.50–1.35)
GFR calc Af Amer: 56 mL/min — ABNORMAL LOW (ref >90)
GFR calc non Af Amer: 48 mL/min — ABNORMAL LOW (ref >90)
Glucose, Bld: 104 mg/dL — ABNORMAL HIGH (ref 70–99)
Potassium: 3.9 mEq/L (ref 3.5–5.1)
Sodium: 138 mEq/L (ref 135–145)
Total Bilirubin: 0.2 mg/dL — ABNORMAL LOW (ref 0.3–1.2)
Total Protein: 7 g/dL (ref 6.0–8.3)

## 2012-05-30 LAB — CBC WITH DIFFERENTIAL/PLATELET
Basophils Absolute: 0 10*3/uL (ref 0.0–0.1)
Basophils Relative: 1 % (ref 0–1)
Eosinophils Absolute: 0.2 10*3/uL (ref 0.0–0.7)
Eosinophils Relative: 5 % (ref 0–5)
HCT: 36.7 % — ABNORMAL LOW (ref 39.0–52.0)
Hemoglobin: 11.7 g/dL — ABNORMAL LOW (ref 13.0–17.0)
Lymphocytes Relative: 31 % (ref 12–46)
Lymphs Abs: 1.3 10*3/uL (ref 0.7–4.0)
MCH: 22.4 pg — ABNORMAL LOW (ref 26.0–34.0)
MCHC: 31.9 g/dL (ref 30.0–36.0)
MCV: 70.2 fL — ABNORMAL LOW (ref 78.0–100.0)
Monocytes Absolute: 0.3 10*3/uL (ref 0.1–1.0)
Monocytes Relative: 8 % (ref 3–12)
Neutro Abs: 2.3 10*3/uL (ref 1.7–7.7)
Neutrophils Relative %: 55 % (ref 43–77)
Platelets: 140 10*3/uL — ABNORMAL LOW (ref 150–400)
RBC: 5.23 MIL/uL (ref 4.22–5.81)
RDW: 16.2 % — ABNORMAL HIGH (ref 11.5–15.5)
WBC: 4.1 10*3/uL (ref 4.0–10.5)

## 2012-05-30 LAB — PROTIME-INR
INR: 1.01 (ref 0.00–1.49)
Prothrombin Time: 13.2 seconds (ref 11.6–15.2)

## 2012-05-30 LAB — APTT: aPTT: 39 seconds — ABNORMAL HIGH (ref 24–37)

## 2012-05-30 LAB — TROPONIN I
Troponin I: <0.3 ng/mL (ref <0.30)
Troponin I: <0.3 ng/mL (ref <0.30)

## 2012-05-30 LAB — PRO B NATRIURETIC PEPTIDE: Pro B Natriuretic peptide (BNP): 31.9 pg/mL (ref 0–125)

## 2012-05-30 LAB — MAGNESIUM: Magnesium: 1.8 mg/dL (ref 1.5–2.5)

## 2012-05-30 LAB — TSH: TSH: 1.795 u[IU]/mL (ref 0.350–4.500)

## 2012-05-30 MED ORDER — SODIUM CHLORIDE 0.9 % IV SOLN
INTRAVENOUS | Status: DC
  Administered 2012-05-30: 21:00:00 via INTRAVENOUS

## 2012-05-30 MED ORDER — ATENOLOL 25 MG PO TABS
25.0000 mg | ORAL_TABLET | Freq: Every day | ORAL | Status: DC
  Administered 2012-05-31 – 2012-06-01 (×2): 25 mg via ORAL
  Filled 2012-05-30 (×2): qty 1

## 2012-05-30 MED ORDER — SODIUM CHLORIDE 0.9 % IJ SOLN: 3.0000 mL | INTRAMUSCULAR | Status: DC | PRN

## 2012-05-30 MED ORDER — TRAMADOL HCL 50 MG PO TABS: 50.0000 mg | ORAL_TABLET | Freq: Four times a day (QID) | ORAL | Status: DC | PRN

## 2012-05-30 MED ORDER — ASPIRIN EC 81 MG PO TBEC
81.0000 mg | DELAYED_RELEASE_TABLET | Freq: Every day | ORAL | Status: DC
  Administered 2012-06-01: 81 mg via ORAL
  Filled 2012-05-30 (×2): qty 1

## 2012-05-30 MED ORDER — FERROUS SULFATE 325 (65 FE) MG PO TABS
325.0000 mg | ORAL_TABLET | Freq: Every day | ORAL | Status: DC
  Administered 2012-06-01: 325 mg via ORAL
  Filled 2012-05-30 (×3): qty 1

## 2012-05-30 MED ORDER — PANTOPRAZOLE SODIUM 40 MG PO TBEC
40.0000 mg | DELAYED_RELEASE_TABLET | Freq: Every day | ORAL | Status: DC
  Administered 2012-05-30 – 2012-05-31 (×2): 40 mg via ORAL
  Filled 2012-05-30 (×2): qty 1

## 2012-05-30 MED ORDER — ACETAMINOPHEN 325 MG PO TABS: 650.0000 mg | ORAL_TABLET | ORAL | Status: DC | PRN

## 2012-05-30 MED ORDER — ONDANSETRON HCL 4 MG/2ML IJ SOLN: 4.0000 mg | Freq: Four times a day (QID) | INTRAMUSCULAR | Status: DC | PRN

## 2012-05-30 MED ORDER — AMLODIPINE BESYLATE 5 MG PO TABS
5.0000 mg | ORAL_TABLET | Freq: Every day | ORAL | Status: DC
  Administered 2012-05-31 – 2012-06-01 (×2): 5 mg via ORAL
  Filled 2012-05-30 (×3): qty 1

## 2012-05-30 MED ORDER — SIMVASTATIN 20 MG PO TABS
20.0000 mg | ORAL_TABLET | Freq: Every day | ORAL | Status: DC
  Administered 2012-05-30 – 2012-05-31 (×2): 20 mg via ORAL
  Filled 2012-05-30 (×3): qty 1

## 2012-05-30 MED ORDER — SODIUM CHLORIDE 0.9 % IV SOLN: 250.0000 mL | INTRAVENOUS | Status: DC | PRN

## 2012-05-30 MED ORDER — SODIUM CHLORIDE 0.9 % IJ SOLN: 3.0000 mL | Freq: Two times a day (BID) | INTRAMUSCULAR | Status: DC

## 2012-05-30 MED ORDER — HYDROCHLOROTHIAZIDE 12.5 MG PO CAPS
12.5000 mg | ORAL_CAPSULE | Freq: Every day | ORAL | Status: DC
  Administered 2012-06-01: 11:00:00 12.5 mg via ORAL
  Filled 2012-05-30 (×3): qty 1

## 2012-05-30 MED ORDER — LOSARTAN POTASSIUM 50 MG PO TABS
100.0000 mg | ORAL_TABLET | Freq: Every day | ORAL | Status: DC
  Administered 2012-05-31: 21:00:00 100 mg via ORAL
  Filled 2012-05-30 (×3): qty 2

## 2012-05-30 MED ORDER — SODIUM CHLORIDE 0.9 % IJ SOLN
3.0000 mL | Freq: Two times a day (BID) | INTRAMUSCULAR | Status: DC
  Administered 2012-05-31: 21:00:00 3 mL via INTRAVENOUS

## 2012-05-30 MED ORDER — ASPIRIN 300 MG RE SUPP
300.0000 mg | RECTAL | Status: AC
  Filled 2012-05-30: qty 1

## 2012-05-30 MED ORDER — DIAZEPAM 5 MG PO TABS
5.0000 mg | ORAL_TABLET | ORAL | Status: AC
  Administered 2012-05-31: 5 mg via ORAL
  Filled 2012-05-30: qty 1

## 2012-05-30 MED ORDER — ASPIRIN 81 MG PO CHEW
324.0000 mg | CHEWABLE_TABLET | ORAL | Status: AC
  Administered 2012-05-30: 324 mg via ORAL
  Filled 2012-05-30: qty 4

## 2012-05-30 MED ORDER — ENOXAPARIN SODIUM 40 MG/0.4ML ~~LOC~~ SOLN
40.0000 mg | SUBCUTANEOUS | Status: DC
  Administered 2012-05-30: 40 mg via SUBCUTANEOUS
  Filled 2012-05-30 (×2): qty 0.4

## 2012-05-30 NOTE — H&P (Signed)
HPI:  The patient comes in for followup today. Over the weekend, he had an episode of chest discomfort both on Sunday and yesterday. The first follow church on Sunday night after he had his lunch. He said he really felt pretty bad at that point in time, and he points to the central chest as opposed to his shoulder. This eventually went away. Yesterday he had some and it got worse as he got up and started moving around it then gradually got better. He denies diaphoresis per say. Nonetheless, the nature of the symptoms been changed since last time. As a result, I encouraged him to be admitted to the hospital although he is somewhat reluctant. I felt that he would be best served by repeat cardiac catheterization visits become very difficult to determine whether or not he has progressive unstable angina. He is now agreeable to readmission  Current Outpatient Prescriptions  Medication Sig Dispense Refill  . albuterol (PROVENTIL HFA;VENTOLIN HFA) 108 (90 BASE) MCG/ACT inhaler Inhale 2 puffs into the lungs every 6 (six) hours as needed. Wheezing and shortness of breath      . amLODipine (NORVASC) 5 MG tablet Take 5 mg by mouth daily before breakfast.      . aspirin 81 MG tablet Take 162 mg by mouth 2 (two) times daily.       Marland Kitchen atenolol (TENORMIN) 25 MG tablet Take 25 mg by mouth daily before breakfast.      . ferrous sulfate 325 (65 FE) MG tablet Take 325 mg by mouth daily with breakfast.      . Iron-Vitamins (GERITOL) LIQD Take 5 mLs by mouth every other day.      . losartan-hydrochlorothiazide (HYZAAR) 100-25 MG per tablet Take 1 tablet by mouth daily before breakfast.       . omeprazole (PRILOSEC) 20 MG capsule Take 20 mg by mouth 2 (two) times daily.       . simvastatin (ZOCOR) 20 MG tablet Take 20 mg by mouth at bedtime.       . traMADol (ULTRAM) 50 MG tablet Take 50 mg by mouth every 6 (six) hours as needed. For pain. Maximum dose= 8 tablets per day       No current facility-administered medications  for this visit.    Allergies  Allergen Reactions  . Hydrocodone     Shortness of breath and leg cramps  . Imdur (Isosorbide Mononitrate) Nausea Only    Headache  . Lisinopril     REACTION: Cough  . Topiramate     Shortness of breath and leg cramps    Past Medical History  Diagnosis Date  . GERD (gastroesophageal reflux disease)   . HLD (hyperlipidemia)   . HTN (hypertension)   . Tobacco abuse   . Cough secondary to angiotensin converting enzyme inhibitor (ACE-I)   . Rotator cuff injury   . Angina   . Peripheral vascular disease   . CAD (coronary artery disease)     A. Negative MV 04/2010;  B. Inadequate Stress Echo 05/2011;  C. 05/29/11 Cath: LM-NL, LAD-77m, LCX- 40p, OM1 small 151m, OM2 90, RCA 40-50p, 24m, EF 55-65% - Med Rx   . Iron deficiency anemia   . DVT (deep venous thrombosis)     Past Surgical History  Procedure Laterality Date  . Iliac artery aneurysm repair    . Circumcision    . Pr vein bypass graft,aorto-fem-pop  10/01/2009    left below knee popliteal artery to posterior tibial artery  . Cardiac  catheterization      Family History  Problem Relation Age of Onset  . Breast cancer Mother     deceased  . Cancer Mother   . Hyperlipidemia Mother   . Hypertension Mother   . COPD Father     deceased  . Cancer Father   . Hyperlipidemia Father   . Hypertension Father   . Hypertension Sister   . Cancer Brother   . Hypertension Brother   . Hypertension Daughter   . Hypertension Son     History   Social History  . Marital Status: Widowed    Spouse Name: N/A    Number of Children: N/A  . Years of Education: N/A   Occupational History  . Retired    Social History Main Topics  . Smoking status: Former Smoker -- 1.00 packs/day for 55 years    Types: Cigarettes    Quit date: 05/26/2011  . Smokeless tobacco: Never Used  . Alcohol Use: No  . Drug Use: No  . Sexually Active: Not on file   Other Topics Concern  . 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    ROS: Please see the HPI.  All other systems reviewed and negative.  PHYSICAL EXAM:  BP 122/70  Pulse 46  Ht 5\' 10"  (1.778 m)  Wt 237 lb (107.502 kg)  BMI 34.01 kg/m2  SpO2 99%  General: Well developed, well nourished, in no acute distress. Head:  Normocephalic and atraumatic. Neck: no JVD Lungs: Clear to auscultation and percussion. Heart: Normal S1 and S2.  No murmur, rubs or gallops.  Abdomen:  Normal bowel sounds; soft; non tender; no organomegaly Pulses: Pulses normal in all 4 extremities. Extremities: No clubbing or cyanosis. No edema. Neurologic: Alert and oriented x 3.  EKG:  Marked SB with first degree av block.  Moderate voltage for LVH.    ASSESSMENT AND PLAN:  1.  CAD with prior cardiac cath ---  Recurrent chest pain.  Possible USAP 2.  Hypertension 3.  Anemia  -- microcytic -- followed by Dr. Nehemiah Settle.   Plan  1.  Admit to hospital for possible USAP 2.  Plan repeat cardiac cath   Risks, benefits, and alternatives discussed with the patient in detail.

## 2012-05-30 NOTE — Progress Notes (Signed)
Utilization review completed.  

## 2012-05-30 NOTE — Patient Instructions (Signed)
Admit to [facility]. 

## 2012-05-30 NOTE — Progress Notes (Signed)
Labs reviewed.  For cardiac cath procedure in the am.  TS

## 2012-05-30 NOTE — Progress Notes (Signed)
HPI:  The patient comes in for followup today. Over the weekend, he had an episode of chest discomfort both on Sunday and yesterday. The first follow church on Sunday night after he had his lunch. He said he really felt pretty bad at that point in time, and he points to the central chest as opposed to his shoulder. This eventually went away. Yesterday he had some and it got worse as he got up and started moving around it then gradually got better. He denies diaphoresis per say. Nonetheless, the nature of the symptoms been changed since last time. As a result, I encouraged him to be admitted to the hospital although he is somewhat reluctant. I felt that he would be best served by repeat cardiac catheterization visits become very difficult to determine whether or not he has progressive unstable angina. He is now agreeable to readmission  Current Outpatient Prescriptions  Medication Sig Dispense Refill  . albuterol (PROVENTIL HFA;VENTOLIN HFA) 108 (90 BASE) MCG/ACT inhaler Inhale 2 puffs into the lungs every 6 (six) hours as needed. Wheezing and shortness of breath      . amLODipine (NORVASC) 5 MG tablet Take 5 mg by mouth daily before breakfast.      . aspirin 81 MG tablet Take 162 mg by mouth 2 (two) times daily.       . atenolol (TENORMIN) 25 MG tablet Take 25 mg by mouth daily before breakfast.      . ferrous sulfate 325 (65 FE) MG tablet Take 325 mg by mouth daily with breakfast.      . Iron-Vitamins (GERITOL) LIQD Take 5 mLs by mouth every other day.      . losartan-hydrochlorothiazide (HYZAAR) 100-25 MG per tablet Take 1 tablet by mouth daily before breakfast.       . omeprazole (PRILOSEC) 20 MG capsule Take 20 mg by mouth 2 (two) times daily.       . simvastatin (ZOCOR) 20 MG tablet Take 20 mg by mouth at bedtime.       . traMADol (ULTRAM) 50 MG tablet Take 50 mg by mouth every 6 (six) hours as needed. For pain. Maximum dose= 8 tablets per day       No current facility-administered medications  for this visit.    Allergies  Allergen Reactions  . Hydrocodone     Shortness of breath and leg cramps  . Imdur (Isosorbide Mononitrate) Nausea Only    Headache  . Lisinopril     REACTION: Cough  . Topiramate     Shortness of breath and leg cramps    Past Medical History  Diagnosis Date  . GERD (gastroesophageal reflux disease)   . HLD (hyperlipidemia)   . HTN (hypertension)   . Tobacco abuse   . Cough secondary to angiotensin converting enzyme inhibitor (ACE-I)   . Rotator cuff injury   . Angina   . Peripheral vascular disease   . CAD (coronary artery disease)     A. Negative MV 04/2010;  B. Inadequate Stress Echo 05/2011;  C. 05/29/11 Cath: LM-NL, LAD-50m, LCX- 40p, OM1 small 100m, OM2 90, RCA 40-50p, 70m, EF 55-65% - Med Rx   . Iron deficiency anemia   . DVT (deep venous thrombosis)     Past Surgical History  Procedure Laterality Date  . Iliac artery aneurysm repair    . Circumcision    . Pr vein bypass graft,aorto-fem-pop  10/01/2009    left below knee popliteal artery to posterior tibial artery  . Cardiac   catheterization      Family History  Problem Relation Age of Onset  . Breast cancer Mother     deceased  . Cancer Mother   . Hyperlipidemia Mother   . Hypertension Mother   . COPD Father     deceased  . Cancer Father   . Hyperlipidemia Father   . Hypertension Father   . Hypertension Sister   . Cancer Brother   . Hypertension Brother   . Hypertension Daughter   . Hypertension Son     History   Social History  . Marital Status: Widowed    Spouse Name: N/A    Number of Children: N/A  . Years of Education: N/A   Occupational History  . Retired    Social History Main Topics  . Smoking status: Former Smoker -- 1.00 packs/day for 55 years    Types: Cigarettes    Quit date: 05/26/2011  . Smokeless tobacco: Never Used  . Alcohol Use: No  . Drug Use: No  . Sexually Active: Not on file   Other Topics Concern  . 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    ROS: Please see the HPI.  All other systems reviewed and negative.  PHYSICAL EXAM:  BP 122/70  Pulse 46  Ht 5' 10" (1.778 m)  Wt 237 lb (107.502 kg)  BMI 34.01 kg/m2  SpO2 99%  General: Well developed, well nourished, in no acute distress. Head:  Normocephalic and atraumatic. Neck: no JVD Lungs: Clear to auscultation and percussion. Heart: Normal S1 and S2.  No murmur, rubs or gallops.  Abdomen:  Normal bowel sounds; soft; non tender; no organomegaly Pulses: Pulses normal in all 4 extremities. Extremities: No clubbing or cyanosis. No edema. Neurologic: Alert and oriented x 3.  EKG:  Marked SB with first degree av block.  Moderate voltage for LVH.    ASSESSMENT AND PLAN:  1.  CAD with prior cardiac cath ---  Recurrent chest pain.  Possible USAP 2.  Hypertension 3.  Anemia  -- microcytic -- followed by Dr. Polite.   Plan  1.  Admit to hospital for possible USAP 2.  Plan repeat cardiac cath   Risks, benefits, and alternatives discussed with the patient in detail.     

## 2012-05-31 ENCOUNTER — Ambulatory Visit (HOSPITAL_COMMUNITY): Admission: RE | Admit: 2012-05-31 | Payer: Medicare Other | Source: Ambulatory Visit | Admitting: Cardiology

## 2012-05-31 ENCOUNTER — Encounter (HOSPITAL_COMMUNITY)
Admission: AD | Disposition: A | Discharge status: Home / Self Care | Payer: Self-pay | Source: Ambulatory Visit | Attending: Cardiology

## 2012-05-31 ENCOUNTER — Other Ambulatory Visit: Payer: Self-pay

## 2012-05-31 DIAGNOSIS — I2510 Atherosclerotic heart disease of native coronary artery without angina pectoris: Secondary | ICD-10-CM

## 2012-05-31 HISTORY — PX: PERCUTANEOUS CORONARY STENT INTERVENTION (PCI-S): SHX5485

## 2012-05-31 HISTORY — PX: LEFT HEART CATH: SHX5946

## 2012-05-31 HISTORY — PX: LEFT HEART CATHETERIZATION WITH CORONARY ANGIOGRAM: SHX5451

## 2012-05-31 LAB — BASIC METABOLIC PANEL
BUN: 13 mg/dL (ref 6–23)
CO2: 29 mEq/L (ref 19–32)
Calcium: 9.3 mg/dL (ref 8.4–10.5)
Chloride: 103 mEq/L (ref 96–112)
Creatinine, Ser: 1.3 mg/dL (ref 0.50–1.35)
GFR calc Af Amer: 62 mL/min — ABNORMAL LOW (ref >90)
GFR calc non Af Amer: 53 mL/min — ABNORMAL LOW (ref >90)
Glucose, Bld: 101 mg/dL — ABNORMAL HIGH (ref 70–99)
Potassium: 3.8 mEq/L (ref 3.5–5.1)
Sodium: 139 mEq/L (ref 135–145)

## 2012-05-31 LAB — TROPONIN I: Troponin I: <0.3 ng/mL (ref <0.30)

## 2012-05-31 LAB — POCT ACTIVATED CLOTTING TIME: Activated Clotting Time: 682 seconds

## 2012-05-31 SURGERY — LEFT HEART CATHETERIZATION WITH CORONARY ANGIOGRAM
Anesthesia: LOCAL

## 2012-05-31 MED ORDER — SODIUM CHLORIDE 0.9 % IV SOLN: 1.0000 mL/kg/h | INTRAVENOUS | Status: AC

## 2012-05-31 MED ORDER — CLOPIDOGREL BISULFATE 300 MG PO TABS
ORAL_TABLET | ORAL | Status: AC
  Filled 2012-05-31: qty 2

## 2012-05-31 MED ORDER — FENTANYL CITRATE 0.05 MG/ML IJ SOLN
INTRAMUSCULAR | Status: AC
  Filled 2012-05-31: qty 2

## 2012-05-31 MED ORDER — BIVALIRUDIN 250 MG IV SOLR
INTRAVENOUS | Status: AC
  Filled 2012-05-31: qty 250

## 2012-05-31 MED ORDER — MIDAZOLAM HCL 2 MG/2ML IJ SOLN
INTRAMUSCULAR | Status: AC
  Filled 2012-05-31: qty 2

## 2012-05-31 MED ORDER — LIDOCAINE HCL (PF) 1 % IJ SOLN
INTRAMUSCULAR | Status: AC
  Filled 2012-05-31: qty 30

## 2012-05-31 MED ORDER — HEPARIN (PORCINE) IN NACL 2-0.9 UNIT/ML-% IJ SOLN
INTRAMUSCULAR | Status: AC
  Filled 2012-05-31: qty 1000

## 2012-05-31 MED ORDER — VERAPAMIL HCL 2.5 MG/ML IV SOLN
INTRAVENOUS | Status: AC
  Filled 2012-05-31: qty 2

## 2012-05-31 MED ORDER — CLOPIDOGREL BISULFATE 75 MG PO TABS
75.0000 mg | ORAL_TABLET | Freq: Every day | ORAL | Status: DC
Start: 2012-06-01 — End: 2012-06-01
  Administered 2012-06-01: 75 mg via ORAL
  Filled 2012-05-31: qty 1

## 2012-05-31 MED ORDER — SODIUM CHLORIDE 0.9 % IV SOLN
0.2500 mg/kg/h | INTRAVENOUS | Status: DC
  Filled 2012-05-31 (×3): qty 250

## 2012-05-31 MED ORDER — NITROGLYCERIN 1 MG/10 ML FOR IR/CATH LAB
INTRA_ARTERIAL | Status: AC
  Filled 2012-05-31: qty 10

## 2012-05-31 MED ORDER — HEPARIN SODIUM (PORCINE) 1000 UNIT/ML IJ SOLN
INTRAMUSCULAR | Status: AC
  Filled 2012-05-31: qty 1

## 2012-05-31 NOTE — Interval H&P Note (Signed)
History and Physical Interval Note:  05/31/2012 11:37 AM  Joel [name]  has presented today for surgery, with the diagnosis of cad  The various methods of treatment have been discussed with the patient and family. After consideration of risks, benefits and other options for treatment, the patient has consented to  Procedure(s): LEFT HEART CATHETERIZATION WITH CORONARY ANGIOGRAM (N/A) as a surgical intervention .  The patient's history has been reviewed, patient examined, no change in status, stable for surgery.  I have reviewed the patient's chart and labs.  Questions were answered to the patient's satisfaction.     Theron Arista [facility] 05/31/2012 11:37 AM

## 2012-05-31 NOTE — Progress Notes (Signed)
TR BAND REMOVAL  LOCATION:  right radial  DEFLATED PER PROTOCOL:  yes  TIME BAND OFF / DRESSING APPLIED:   1700   SITE UPON ARRIVAL:   Level 0  SITE AFTER BAND REMOVAL:  Level 0  REVERSE ALLEN'S TEST:    positive  CIRCULATION SENSATION AND MOVEMENT:  Within Normal Limits  yes  COMMENTS:    

## 2012-05-31 NOTE — CV Procedure (Signed)
Cardiac Catheterization Procedure Note  Name: Joel [name] MRN: [id number] DOB: 08/12/1940  Procedure: Left Heart Cath, Selective Coronary Angiography, LV angiography, PTCA and stenting of the second Obtuse marginal vessel.  Indication: 72 year old black male with known history of coronary disease. He presents with progressive class III angina refractory to outpatient medical therapy with 2 antianginal agents.  Procedural Details:  Because previous cardiac catheterization via the right radial access was very difficult we used a left radial access. The left wrist was prepped, draped, and anesthetized with 1% lidocaine. Using the modified Seldinger technique, a 5 French sheath was introduced into the right radial artery. 3 mg of verapamil was administered through the sheath, weight-based unfractionated heparin was administered intravenously. Standard Judkins catheters were used for selective coronary angiography and left ventriculography. Catheter exchanges were performed over an exchange length guidewire.  PROCEDURAL FINDINGS Hemodynamics: AO 141/65 with a mean of 92 mmHg LV 142/12 mmHg   Coronary angiography: Coronary dominance: right  Left mainstem: Normal  Left anterior descending (LAD): The LAD is mildly ectatic in the proximal vessel. There is a focal 40% lesion in the proximal LAD. The distal vessel has mild irregularities.  Left circumflex (LCx): The left circumflex gives rise to 2 marginal branches. The first marginal branch is occluded in the mid vessel. The second marginal branch has a 90% stenosis proximally. The proximal and mid left circumflex is moderately ectatic.  Right coronary artery (RCA): The right coronary is a dominant vessel. It has a shepherd's crook deformity in the proximal vessel. The proximal vessel is diffusely diseased up to 40%. The mid vessel has severe ectasia. The distal vessel has diffuse 40% disease. The PDA is a small branch with 50% disease  proximally.  Left ventriculography: Left ventricular systolic function is normal, LVEF is estimated at 55-65%, there is no significant mitral regurgitation   PCI Note:  Following the diagnostic procedure, the decision was made to proceed with PCI of the second obtuse marginal vessel. The radial sheath was upsized to a 6 [place]. Plavix 600 mg was given orally. Weight-based bivalirudin was given for anticoagulation. Once a therapeutic ACT was achieved, a 6 [place] CLS 4.5 guide catheter was inserted. The lesion was very difficult to cross with a wire. There was a side branch that came off at the lesion and the lesion itself was a shelflike stenosis. We will unable to cross with a pro-water, [name], or whisper wire. A PT2 coronary guidewire was used to finally cross the lesion.  The lesion was predilated with a 2.5 mm compliant balloon.  The lesion was then stented with a 3.5 x 16 mm Veriflex stent.  The stent was postdilated with a 3.75 mm noncompliant balloon.  Following PCI, there was 0% residual stenosis and TIMI-3 flow. Final angiography confirmed an excellent result. The patient tolerated the procedure well. There were no immediate procedural complications. A TR band was used for radial hemostasis. The patient was transferred to the post catheterization recovery area for further monitoring.  PCI Data: Vessel - Second obtuse marginal vessel/Segment - proximal Percent Stenosis (pre)  90% TIMI-flow 3 Stent 3.5 x 16 mm Veriflex Percent Stenosis (post) 0% TIMI-flow (post) 3  Final Conclusions:   1. Single vessel obstructive coronary disease. There is chronic total occlusion of the first obtuse marginal vessel which is very small. There is a 90% stenosis in the second obtuse marginal vessel which is a large branch. The patient has diffuse atherosclerosis and coronary ectasia. 2. Normal left ventricular  function 3. Successful stenting of the second obtuse marginal vessel with a bare-metal stent.    Recommendations:  Continue dual antiplatelet therapy for at least one month.  Joel [name] [facility] 05/31/2012, 1:13 PM

## 2012-06-01 ENCOUNTER — Encounter (HOSPITAL_COMMUNITY): Payer: Self-pay | Admitting: Physician Assistant

## 2012-06-01 DIAGNOSIS — I200 Unstable angina: Secondary | ICD-10-CM | POA: Diagnosis present

## 2012-06-01 DIAGNOSIS — D509 Iron deficiency anemia, unspecified: Secondary | ICD-10-CM | POA: Diagnosis present

## 2012-06-01 DIAGNOSIS — D696 Thrombocytopenia, unspecified: Secondary | ICD-10-CM | POA: Diagnosis present

## 2012-06-01 LAB — BASIC METABOLIC PANEL
BUN: 10 mg/dL (ref 6–23)
CO2: 28 mEq/L (ref 19–32)
Calcium: 9.3 mg/dL (ref 8.4–10.5)
Chloride: 106 mEq/L (ref 96–112)
Creatinine, Ser: 1.25 mg/dL (ref 0.50–1.35)
GFR calc Af Amer: 65 mL/min — ABNORMAL LOW (ref >90)
GFR calc non Af Amer: 56 mL/min — ABNORMAL LOW (ref >90)
Glucose, Bld: 105 mg/dL — ABNORMAL HIGH (ref 70–99)
Potassium: 3.9 mEq/L (ref 3.5–5.1)
Sodium: 141 mEq/L (ref 135–145)

## 2012-06-01 LAB — CBC
HCT: 36.5 % — ABNORMAL LOW (ref 39.0–52.0)
Hemoglobin: 11.6 g/dL — ABNORMAL LOW (ref 13.0–17.0)
MCH: 22.1 pg — ABNORMAL LOW (ref 26.0–34.0)
MCHC: 31.8 g/dL (ref 30.0–36.0)
MCV: 69.5 fL — ABNORMAL LOW (ref 78.0–100.0)
Platelets: 126 10*3/uL — ABNORMAL LOW (ref 150–400)
RBC: 5.25 MIL/uL (ref 4.22–5.81)
RDW: 16.4 % — ABNORMAL HIGH (ref 11.5–15.5)
WBC: 4.8 10*3/uL (ref 4.0–10.5)

## 2012-06-01 MED ORDER — CLOPIDOGREL BISULFATE 75 MG PO TABS: 75.0000 mg | ORAL_TABLET | Freq: Every day | ORAL | Status: DC

## 2012-06-01 MED ORDER — LOSARTAN POTASSIUM-HCTZ 100-25 MG PO TABS: 1.0000 | ORAL_TABLET | Freq: Every day | ORAL | Status: DC

## 2012-06-01 MED ORDER — SIMVASTATIN 20 MG PO TABS: 20.0000 mg | ORAL_TABLET | Freq: Every day | ORAL | Status: DC

## 2012-06-01 MED ORDER — NITROGLYCERIN 0.4 MG SL SUBL: 0.4000 mg | SUBLINGUAL_TABLET | SUBLINGUAL | Status: DC | PRN

## 2012-06-01 MED FILL — Dextrose Inj 5%: INTRAVENOUS | Qty: 50 | Status: AC

## 2012-06-01 NOTE — Discharge Summary (Signed)
CARDIOLOGY DISCHARGE SUMMARY   [name] MRN: [id number] DOB/AGE: Apr 18, 1940 72 y.o.  Admit date: 05/30/2012 Discharge date: 06/01/2012  Primary Discharge Diagnosis:   Unstable angina - status post bare-metal stent to the OM 2  Secondary Discharge Diagnosis:    Microcytic anemia, history of iron deficiency   Hyperlipidemia   Mild thrombocytopenia  Procedures:  Left Heart Cath, Selective Coronary Angiography, LV angiography, PTCA and stenting of the second Obtuse marginal vessel  Hospital Course:  Joel [name] is a 72 year old male with a history of coronary artery disease. He was seen in the office by Dr. Riley Kill, complaining of chest pain with minimal exertion and some chest pain at rest. He was admitted for further evaluation and cardiac catheterization.  His cardiac enzymes were negative for MI. A lipid profile was checked with results below. The results were reviewed, and no medication changes were made. His creatinine was mildly elevated so his diuretic was held and he was hydrated.  The cardiac catheterization was performed on 05/31/2012 with full results below. He had a bare-metal stent placed to the OM 2 with good results. He tolerated the procedure well.  On 06/01/2012, his kidney function was within normal limits. He has microcytic anemia but this is not a new finding. His hemoglobin was not significantly changed post cath. His platelets were slightly decreased post cath, but he was having no bleeding issues. Joel [name] was evaluated by Dr. Riley Kill and considered stable for discharge, to follow up as an outpatient.  Labs:   Lab Results  Component Value Date   WBC 4.8 06/01/2012   HGB 11.6* 06/01/2012   HCT 36.5* 06/01/2012   MCV 69.5* 06/01/2012   PLT 126* 06/01/2012    Recent Labs Lab 05/30/12 1557  06/01/12 0635  NA 138  < > 141  K 3.9  < > 3.9  CL 101  < > 106  CO2 29  < > 28  BUN 13  < > 10  CREATININE 1.41*  < > 1.25  CALCIUM 9.4  < >  9.3  PROT 7.0  --   --   BILITOT 0.2*  --   --   ALKPHOS 68  --   --   ALT 18  --   --   AST 16  --   --   GLUCOSE 104*  < > 105*  < > = values in this interval not displayed.  Recent Labs  05/30/12 1557 05/30/12 2006 05/31/12 0235  TROPONINI <0.30 <0.30 <0.30   Lipid Panel     Component Value Date/Time   CHOL 144 05/31/2011 0254   TRIG 103 05/31/2011 0254   HDL 35* 05/31/2011 0254   CHOLHDL 4.1 05/31/2011 0254   VLDL 21 05/31/2011 0254   LDLCALC 88 05/31/2011 0254    Pro B Natriuretic peptide (BNP)  Date/Time Value Range Status  05/30/2012  3:57 PM 31.9  0 - 125 pg/mL Final  10/30/2010  3:39 AM 96.5  0 - 125 pg/mL Final    Recent Labs  05/30/12 1557  INR 1.01    Cardiac Cath: 6 05/31/2012  Left mainstem: Normal  Left anterior descending (LAD): The LAD is mildly ectatic in the proximal vessel. There is a focal 40% lesion in the proximal LAD. The distal vessel has mild irregularities.  Left circumflex (LCx): The left circumflex gives rise to 2 marginal branches. The first marginal branch is occluded in the mid vessel. The second marginal branch has a 90% stenosis  proximally. The proximal and mid left circumflex is moderately ectatic.  Right coronary artery (RCA): The right coronary is a dominant vessel. It has a shepherd's crook deformity in the proximal vessel. The proximal vessel is diffusely diseased up to 40%. The mid vessel has severe ectasia. The distal vessel has diffuse 40% disease. The PDA is a small branch with 50% disease proximally.  Left ventriculography: Left ventricular systolic function is normal, LVEF is estimated at 55-65%, there is no significant mitral regurgitation  PCI Data:  Vessel - Second obtuse marginal vessel/Segment - proximal  Percent Stenosis (pre) 90%  TIMI-flow 3  Stent 3.5 x 16 mm Veriflex  Percent Stenosis (post) 0%  TIMI-flow (post) 3  Final Conclusions:  1. Single vessel obstructive coronary disease. There is chronic total occlusion of  the first obtuse marginal vessel which is very small. There is a 90% stenosis in the second obtuse marginal vessel which is a large branch. The patient has diffuse atherosclerosis and coronary ectasia.  2. Normal left ventricular function  3. Successful stenting of the second obtuse marginal vessel with a bare-metal stent.   EKG: 01-Jun-2012 05:47:13 [facility] ROUTINE RECORD Sinus bradycardia with 1st degree A-V block Otherwise normal ECG 92mm/s 45mm/mV 100Hz  8.0.1 12SL 241 HD CID: 1 Referred by: Herby Abraham Unconfirmed Vent. rate 59 BPM PR interval 224 ms QRS duration 100 ms QT/QTc 428/423 ms P-R-T axes 68 70 66   FOLLOW UP PLANS AND APPOINTMENTS Allergies  Allergen Reactions  . Hydrocodone     Shortness of breath and leg cramps  . Imdur (Isosorbide Mononitrate) Nausea Only    Headache  . Lisinopril     REACTION: Cough  . Topiramate     Shortness of breath and leg cramps     Medication List    TAKE these medications       albuterol 108 (90 BASE) MCG/ACT inhaler  Commonly known as:  PROVENTIL HFA;VENTOLIN HFA  Inhale 2 puffs into the lungs every 6 (six) hours as needed. Wheezing and shortness of breath     amLODipine 5 MG tablet  Commonly known as:  NORVASC  Take 5 mg by mouth daily before breakfast.     aspirin 81 MG tablet  Take 81 mg by mouth 2 (two) times daily.     atenolol 25 MG tablet  Commonly known as:  TENORMIN  Take 25 mg by mouth daily before breakfast.     clopidogrel 75 MG tablet  Commonly known as:  PLAVIX  Take 1 tablet (75 mg total) by mouth daily with breakfast.     ferrous sulfate 325 (65 FE) MG tablet  Take 325 mg by mouth daily with breakfast.     Geritol Liqd  Take 5 mLs by mouth every other day.     losartan-hydrochlorothiazide 100-25 MG per tablet  Commonly known as:  HYZAAR  Take 1 tablet by mouth daily before breakfast. HOLD for 2 days, restart on 06/03/2012.     nitroGLYCERIN 0.4 MG SL tablet  Commonly  known as:  NITROSTAT  Place 1 tablet (0.4 mg total) under the tongue every 5 (five) minutes as needed for chest pain.     omeprazole 20 MG capsule  Commonly known as:  PRILOSEC  Take 20 mg by mouth 2 (two) times daily.     simvastatin 20 MG tablet  Commonly known as:  ZOCOR  Take 1 tablet (20 mg total) by mouth at bedtime.     traMADol 50 MG  tablet  Commonly known as:  ULTRAM  Take 50 mg by mouth every 6 (six) hours as needed. For pain. Maximum dose= 8 tablets per day        Discharge Orders   Future Appointments Provider Department Dept Phone   06/05/2012 12:00 PM [facility] [phone]   NPO after midnight   06/05/2012 3:00 PM [facility] [facility] [phone]   06/12/2012 3:15 PM Herby Abraham, MD [facility] Main Office [place]) [phone]   02/28/2013 10:00 AM [facility] [phone]   02/28/2013 10:30 AM [facility] Lab 2 [facility] [phone]   02/28/2013 11:00 AM Evern Bio, NP [facility] [phone]   Future Orders Complete By Expires     Amb Referral to Cardiac Rehabilitation  As directed     Diet - low sodium heart healthy  As directed     Increase activity slowly  As directed       Follow-up Information   Follow up with [name], MD On 06/12/2012. (at 3:15 pm)    Contact information:   [address] [place] [address] [phone]       BRING ALL MEDICATIONS WITH YOU TO FOLLOW UP APPOINTMENTS  Time spent with patient to include physician time: 35 min Signed: Theodore Demark 06/01/2012, 8:42 PM Co-Sign MD

## 2012-06-01 NOTE — Progress Notes (Signed)
CARDIAC REHAB PHASE I   PRE:  Rate/Rhythm: 63 SR  BP:  Supine:   Sitting: 148/84  Standing:    SaO2:   MODE:  Ambulation: 1000 ft   POST:  Rate/Rhythem: 78 SR  BP:  Supine:   Sitting: 170/86  Standing:    SaO2:  1610-9604 Pt tolerated ambulation well without c/o of cp or SOB. VS stable. Completed discharge education with pt. He voices understanding. He agrees to [employer]. CRP in [place], will send referral. States tha he quit smoking one year ago, congratulated him and encouraged compliance.   Beatrix Fetters

## 2012-06-01 NOTE — Progress Notes (Signed)
Patient Name: Joel [name] Date of Encounter: 06/01/2012     Active Problems:   * No active hospital problems. *    SUBJECTIVE  Doing well at this point in time.  Has minor pain at this time left parasternal.  Wants to go home.   CURRENT MEDS . amLODipine  5 mg Oral Daily  . aspirin EC  81 mg Oral Daily  . atenolol  25 mg Oral Daily  . clopidogrel  75 mg Oral Q breakfast  . ferrous sulfate  325 mg Oral Q breakfast  . hydrochlorothiazide  12.5 mg Oral Daily  . losartan  100 mg Oral Daily  . pantoprazole  40 mg Oral Daily  . simvastatin  20 mg Oral q1800  . sodium chloride  3 mL Intravenous Q12H    OBJECTIVE  Filed Vitals:   05/31/12 2036 06/01/12 0006 06/01/12 0542 06/01/12 0710  BP: 170/81 129/60 160/75   Pulse:  63 68   Temp:  97.7 F (36.5 C) 97.9 F (36.6 C) 98.7 F (37.1 C)  TempSrc:  Oral Oral Oral  Resp:      Height:      Weight:   223 lb 15.8 oz (101.6 kg)   SpO2:  95% 96% 96%    Intake/Output Summary (Last 24 hours) at 06/01/12 0818 Last data filed at 06/01/12 0545  Gross per 24 hour  Intake    820 ml  Output   1500 ml  Net   -680 ml   Filed Weights   05/31/12 0500 06/01/12 0542  Weight: 225 lb 14.4 oz (102.468 kg) 223 lb 15.8 oz (101.6 kg)    PHYSICAL EXAM  General: Pleasant, NAD. Neuro: Alert and oriented X 3. Moves all extremities spontaneously. Psych: Normal affect. HEENT:  Normal  Neck: Supple without bruits or JVD. Lungs:  Resp regular and unlabored, CTA. Heart: RRR no s3, s4, or murmurs. Extremities: No clubbing, cyanosis or edema. DP/PT/Radials 2+ and equal bilaterally.  Left wrist looks good.    Accessory Clinical Findings  CBC  Recent Labs  05/30/12 1557 06/01/12 0635  WBC 4.1 4.8  NEUTROABS 2.3  --   HGB 11.7* 11.6*  HCT 36.7* 36.5*  MCV 70.2* 69.5*  PLT 140* 126*   Basic Metabolic Panel  Recent Labs  05/30/12 1557 05/31/12 0235 06/01/12 0635  NA 138 139 141  K 3.9 3.8 3.9  CL 101 103 106  CO2 29 29  28   GLUCOSE 104* 101* 105*  BUN 13 13 10   CREATININE 1.41* 1.30 1.25  CALCIUM 9.4 9.3 9.3  MG 1.8  --   --    Liver Function Tests  Recent Labs  05/30/12 1557  AST 16  ALT 18  ALKPHOS 68  BILITOT 0.2*  PROT 7.0  ALBUMIN 3.6   No results found for this basename: LIPASE, AMYLASE,  in the last 72 hours Cardiac Enzymes  Recent Labs  05/30/12 1557 05/30/12 2006 05/31/12 0235  TROPONINI <0.30 <0.30 <0.30   BNP No components found with this basename: POCBNP,  D-Dimer No results found for this basename: DDIMER,  in the last 72 hours Hemoglobin A1C No results found for this basename: HGBA1C,  in the last 72 hours Fasting Lipid Panel No results found for this basename: CHOL, HDL, LDLCALC, TRIG, CHOLHDL, LDLDIRECT,  in the last 72 hours Thyroid Function Tests  Recent Labs  05/30/12 1557  TSH 1.795    TELE  No major arrhythmia  ECG  Normal Sinus rhythm.  No acute ST changes.    Radiology/Studies  Dg Chest 2 View  05/18/2012  *RADIOLOGY REPORT*  Clinical Data: Left-sided chest and shoulder pain.  History of smoking and hypertension.  CHEST - 2 VIEW  Comparison: 09/15/2011 plain film and CT.  Findings: Moderate mid thoracic spondylosis. Midline trachea. Normal heart size and mediastinal contours for age.  No pleural effusion or pneumothorax.  Clear lungs.  IMPRESSION: No acute cardiopulmonary disease.   Original Report Authenticated By: Jeronimo Greaves, M.D.     ASSESSMENT AND PLAN  1.  No chest pain  Unstable angina sp PCI 2.  Microcytic anemia  He has a BMS which will require just 4 weeks DAPT.  We will stop clopidogrel at that time.  I reviewed this with him in detail.  He should have follow up with me in about two weeks.    Signed, [name] MD, [facility], FSC[facility]

## 2012-06-04 NOTE — Discharge Summary (Signed)
The patient was seen and evaluated.  See my note.  He will be dc'd with follow up with me in the office.  He will need shortened DAPT treatment due to anemia.  Discussed with the patient in detail.  More than thirty minutes of dc time. TS

## 2012-06-05 ENCOUNTER — Encounter (HOSPITAL_COMMUNITY)
Admission: RE | Admit: 2012-06-05 | Discharge: 2012-06-05 | Disposition: A | Discharge status: Home / Self Care | Payer: Medicare Other | Source: Ambulatory Visit | Attending: Urology | Admitting: Urology

## 2012-06-05 ENCOUNTER — Telehealth: Payer: Self-pay | Admitting: Cardiology

## 2012-06-05 DIAGNOSIS — C61 Malignant neoplasm of prostate: Secondary | ICD-10-CM | POA: Insufficient documentation

## 2012-06-05 MED ORDER — TECHNETIUM TC 99M MEDRONATE IV KIT: 27.5000 | PACK | Freq: Once | INTRAVENOUS | Status: DC | PRN

## 2012-06-05 NOTE — Telephone Encounter (Signed)
Returned call to Mr. Chiara. He was hospitalized four days ago with unstable angina treated with a BMS to the OM2. He reports having sudden onset of chest pain this evening after eating dinner. It was sharp, substernal and associated with mild shortness of breath. He took 3 SL NTG without relief. I instructed him to seek immediate medical attention. He stated understanding.  [place], PA-C 06/05/2012 7:11 AM

## 2012-06-07 ENCOUNTER — Other Ambulatory Visit: Payer: Self-pay | Admitting: *Deleted

## 2012-06-07 DIAGNOSIS — R079 Chest pain, unspecified: Secondary | ICD-10-CM

## 2012-06-07 DIAGNOSIS — I2510 Atherosclerotic heart disease of native coronary artery without angina pectoris: Secondary | ICD-10-CM

## 2012-06-12 ENCOUNTER — Ambulatory Visit (INDEPENDENT_AMBULATORY_CARE_PROVIDER_SITE_OTHER): Payer: Medicare Other | Admitting: Cardiology

## 2012-06-12 ENCOUNTER — Encounter: Payer: Self-pay | Admitting: Cardiology

## 2012-06-12 VITALS — BP 120/78 | HR 51 | Ht 70.0 in | Wt 238.0 lb

## 2012-06-12 DIAGNOSIS — I2510 Atherosclerotic heart disease of native coronary artery without angina pectoris: Secondary | ICD-10-CM

## 2012-06-12 DIAGNOSIS — D509 Iron deficiency anemia, unspecified: Secondary | ICD-10-CM

## 2012-06-12 NOTE — Assessment & Plan Note (Signed)
Got large BMS to the OM.  Will hold omeprazole for two weeks then restart when stopping plavix in anther two weeks.

## 2012-06-12 NOTE — Progress Notes (Signed)
HPI:  Overall doing pretty well.  He says he feels good;  Had some sharp chest pain, which then resolved;  Called in at that time.  No real further problems.  Goes to church and sings.   Current Outpatient Prescriptions  Medication Sig Dispense Refill  . albuterol (PROVENTIL HFA;VENTOLIN HFA) 108 (90 BASE) MCG/ACT inhaler Inhale 2 puffs into the lungs every 6 (six) hours as needed. Wheezing and shortness of breath      . amLODipine (NORVASC) 5 MG tablet Take 5 mg by mouth daily before breakfast.      . aspirin 81 MG tablet Take 81 mg by mouth 2 (two) times daily.       Marland Kitchen atenolol (TENORMIN) 25 MG tablet Take 25 mg by mouth daily before breakfast.      . clopidogrel (PLAVIX) 75 MG tablet Take 1 tablet (75 mg total) by mouth daily with breakfast.  30 tablet  0  . ferrous sulfate 325 (65 FE) MG tablet Take 325 mg by mouth daily with breakfast.      . Iron-Vitamins (GERITOL) LIQD Take 5 mLs by mouth every other day.      . losartan-hydrochlorothiazide (HYZAAR) 100-25 MG per tablet Take 1 tablet by mouth daily before breakfast. HOLD for 2 days, restart on 06/03/2012.  30 tablet  11  . nitroGLYCERIN (NITROSTAT) 0.4 MG SL tablet Place 1 tablet (0.4 mg total) under the tongue every 5 (five) minutes as needed for chest pain.  25 tablet  3  . omeprazole (PRILOSEC) 20 MG capsule Take 20 mg by mouth 2 (two) times daily.       . simvastatin (ZOCOR) 20 MG tablet Take 1 tablet (20 mg total) by mouth at bedtime.  30 tablet  11  . traMADol (ULTRAM) 50 MG tablet Take 50 mg by mouth every 6 (six) hours as needed. For pain. Maximum dose= 8 tablets per day       No current facility-administered medications for this visit.    Allergies  Allergen Reactions  . Hydrocodone     Shortness of breath and leg cramps  . Imdur (Isosorbide Mononitrate) Nausea Only    Headache  . Lisinopril     REACTION: Cough  . Topiramate     Shortness of breath and leg cramps    Past Medical History  Diagnosis Date  . GERD  (gastroesophageal reflux disease)   . HLD (hyperlipidemia)   . HTN (hypertension)   . Tobacco abuse   . Cough secondary to angiotensin converting enzyme inhibitor (ACE-I)   . Rotator cuff injury   . Angina   . Peripheral vascular disease   . CAD (coronary artery disease)     A. Negative MV 04/2010;  B. Inadequate Stress Echo 05/2011;  C. 05/29/11 Cath: LM-NL, LAD-69m, LCX- 40p, OM1 small 141m, OM2 90, RCA 40-50p, 70m, EF 55-65% - Med Rx   . Iron deficiency anemia   . DVT (deep venous thrombosis)   . Shortness of breath   . Microcytic anemia     Past Surgical History  Procedure Laterality Date  . Iliac artery aneurysm repair    . Circumcision    . Pr vein bypass graft,aorto-fem-pop  10/01/2009    left below knee popliteal artery to posterior tibial artery  . Cardiac catheterization      Family History  Problem Relation Age of Onset  . Breast cancer Mother     deceased  . Cancer Mother   . Hyperlipidemia Mother   .  Hypertension Mother   . COPD Father     deceased  . Cancer Father   . Hyperlipidemia Father   . Hypertension Father   . Hypertension Sister   . Cancer Brother   . Hypertension Brother   . Hypertension Daughter   . Hypertension Son     History   Social History  . Marital Status: Widowed    Spouse Name: N/A    Number of Children: N/A  . Years of Education: N/A   Occupational History  . Retired    Social History Main Topics  . Smoking status: Former Smoker -- 1.00 packs/day for 55 years    Types: Cigarettes    Quit date: 05/26/2011  . Smokeless tobacco: Never Used  . Alcohol Use: No  . Drug Use: No  . Sexually Active: Not on file   Other Topics Concern  . 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    ROS: Please see the HPI.  All other systems reviewed and negative.  PHYSICAL EXAM:  BP 120/78  Pulse 51  Ht 5\' 10"  (1.778 m)  Wt 238 lb (107.956 kg)  BMI 34.15 kg/m2  SpO2 95%  General: Well  developed, well nourished, in no acute distress. Head:  Normocephalic and atraumatic. Neck: no JVD Lungs: Clear to auscultation and percussion. Heart: Normal S1 and S2.  No murmur, rubs or gallops.  Pulses: Pulses normal in all 4 extremities. Extremities: No clubbing or cyanosis. No edema. Neurologic: Alert and oriented x 3.  EKG:  SB.  First degree av block.    ASSESSMENT AND PLAN:

## 2012-06-12 NOTE — Assessment & Plan Note (Signed)
Will recheck CBC

## 2012-06-12 NOTE — Patient Instructions (Addendum)
Your physician has recommended you make the following change in your medication: STOP Prilosec while taking Plavix, we will restart this medication in the future  Your physician recommends that you return for lab work: CBC (06/15/12--7:30 -4:30)  Your physician recommends that you schedule a follow-up appointment in: 2 WEEKS with Dr Riley Kill

## 2012-06-15 ENCOUNTER — Other Ambulatory Visit: Payer: Medicare Other

## 2012-06-15 ENCOUNTER — Emergency Department (HOSPITAL_COMMUNITY)
Admission: EM | Admit: 2012-06-15 | Discharge: 2012-06-15 | Disposition: A | Discharge status: Home / Self Care | Payer: Medicare Other | Attending: Emergency Medicine | Admitting: Emergency Medicine

## 2012-06-15 ENCOUNTER — Encounter (HOSPITAL_COMMUNITY): Payer: Self-pay | Admitting: Emergency Medicine

## 2012-06-15 ENCOUNTER — Emergency Department (HOSPITAL_COMMUNITY): Payer: Medicare Other

## 2012-06-15 DIAGNOSIS — E785 Hyperlipidemia, unspecified: Secondary | ICD-10-CM | POA: Insufficient documentation

## 2012-06-15 DIAGNOSIS — Z87828 Personal history of other (healed) physical injury and trauma: Secondary | ICD-10-CM | POA: Insufficient documentation

## 2012-06-15 DIAGNOSIS — R0602 Shortness of breath: Secondary | ICD-10-CM

## 2012-06-15 DIAGNOSIS — R079 Chest pain, unspecified: Secondary | ICD-10-CM | POA: Insufficient documentation

## 2012-06-15 DIAGNOSIS — J441 Chronic obstructive pulmonary disease with (acute) exacerbation: Secondary | ICD-10-CM | POA: Insufficient documentation

## 2012-06-15 DIAGNOSIS — Z87891 Personal history of nicotine dependence: Secondary | ICD-10-CM | POA: Insufficient documentation

## 2012-06-15 DIAGNOSIS — Z9889 Other specified postprocedural states: Secondary | ICD-10-CM | POA: Insufficient documentation

## 2012-06-15 DIAGNOSIS — K219 Gastro-esophageal reflux disease without esophagitis: Secondary | ICD-10-CM | POA: Insufficient documentation

## 2012-06-15 DIAGNOSIS — R42 Dizziness and giddiness: Secondary | ICD-10-CM | POA: Insufficient documentation

## 2012-06-15 DIAGNOSIS — I10 Essential (primary) hypertension: Secondary | ICD-10-CM | POA: Insufficient documentation

## 2012-06-15 DIAGNOSIS — Z79899 Other long term (current) drug therapy: Secondary | ICD-10-CM | POA: Insufficient documentation

## 2012-06-15 DIAGNOSIS — I2510 Atherosclerotic heart disease of native coronary artery without angina pectoris: Secondary | ICD-10-CM | POA: Insufficient documentation

## 2012-06-15 DIAGNOSIS — Z8679 Personal history of other diseases of the circulatory system: Secondary | ICD-10-CM | POA: Insufficient documentation

## 2012-06-15 DIAGNOSIS — Z7982 Long term (current) use of aspirin: Secondary | ICD-10-CM | POA: Insufficient documentation

## 2012-06-15 DIAGNOSIS — D509 Iron deficiency anemia, unspecified: Secondary | ICD-10-CM | POA: Insufficient documentation

## 2012-06-15 DIAGNOSIS — Z86718 Personal history of other venous thrombosis and embolism: Secondary | ICD-10-CM | POA: Insufficient documentation

## 2012-06-15 HISTORY — DX: Chronic obstructive pulmonary disease, unspecified: J44.9

## 2012-06-15 LAB — COMPREHENSIVE METABOLIC PANEL
ALT: 20 U/L (ref 0–53)
AST: 18 U/L (ref 0–37)
Albumin: 3.6 g/dL (ref 3.5–5.2)
Alkaline Phosphatase: 70 U/L (ref 39–117)
BUN: 14 mg/dL (ref 6–23)
CO2: 29 mEq/L (ref 19–32)
Calcium: 9.9 mg/dL (ref 8.4–10.5)
Chloride: 101 mEq/L (ref 96–112)
Creatinine, Ser: 1.36 mg/dL — ABNORMAL HIGH (ref 0.50–1.35)
GFR calc Af Amer: 58 mL/min — ABNORMAL LOW (ref >90)
GFR calc non Af Amer: 50 mL/min — ABNORMAL LOW (ref >90)
Glucose, Bld: 106 mg/dL — ABNORMAL HIGH (ref 70–99)
Potassium: 3.7 mEq/L (ref 3.5–5.1)
Sodium: 140 mEq/L (ref 135–145)
Total Bilirubin: 0.2 mg/dL — ABNORMAL LOW (ref 0.3–1.2)
Total Protein: 6.9 g/dL (ref 6.0–8.3)

## 2012-06-15 LAB — CBC WITH DIFFERENTIAL/PLATELET
Basophils Absolute: 0 10*3/uL (ref 0.0–0.1)
Basophils Relative: 1 % (ref 0–1)
Eosinophils Absolute: 0.3 10*3/uL (ref 0.0–0.7)
Eosinophils Relative: 7 % — ABNORMAL HIGH (ref 0–5)
HCT: 34.9 % — ABNORMAL LOW (ref 39.0–52.0)
Hemoglobin: 11.3 g/dL — ABNORMAL LOW (ref 13.0–17.0)
Lymphocytes Relative: 30 % (ref 12–46)
Lymphs Abs: 1.1 10*3/uL (ref 0.7–4.0)
MCH: 22.1 pg — ABNORMAL LOW (ref 26.0–34.0)
MCHC: 32.4 g/dL (ref 30.0–36.0)
MCV: 68.2 fL — ABNORMAL LOW (ref 78.0–100.0)
Monocytes Absolute: 0.3 10*3/uL (ref 0.1–1.0)
Monocytes Relative: 9 % (ref 3–12)
Neutro Abs: 2 10*3/uL (ref 1.7–7.7)
Neutrophils Relative %: 53 % (ref 43–77)
Platelets: 158 10*3/uL (ref 150–400)
RBC: 5.12 MIL/uL (ref 4.22–5.81)
RDW: 16.4 % — ABNORMAL HIGH (ref 11.5–15.5)
WBC: 3.7 10*3/uL — ABNORMAL LOW (ref 4.0–10.5)

## 2012-06-15 LAB — TROPONIN I
Troponin I: <0.3 ng/mL (ref <0.30)
Troponin I: <0.3 ng/mL (ref <0.30)
Troponin I: <0.3 ng/mL (ref <0.30)

## 2012-06-15 LAB — URINALYSIS, ROUTINE W REFLEX MICROSCOPIC
Bilirubin Urine: NEGATIVE
Glucose, UA: NEGATIVE mg/dL
Hgb urine dipstick: NEGATIVE
Ketones, ur: NEGATIVE mg/dL
Leukocytes, UA: NEGATIVE
Nitrite: NEGATIVE
Protein, ur: NEGATIVE mg/dL
Specific Gravity, Urine: 1.009 (ref 1.005–1.030)
Urobilinogen, UA: 0.2 mg/dL (ref 0.0–1.0)
pH: 6 (ref 5.0–8.0)

## 2012-06-15 MED ORDER — ALBUTEROL SULFATE HFA 108 (90 BASE) MCG/ACT IN AERS
2.0000 | INHALATION_SPRAY | Freq: Once | RESPIRATORY_TRACT | Status: AC
  Administered 2012-06-15: 2 via RESPIRATORY_TRACT
  Filled 2012-06-15: qty 6.7

## 2012-06-15 NOTE — ED Notes (Signed)
Spoke with Dr. Juleen China and informed him that pt's third troponin was negative. POC is to discharge pt home.

## 2012-06-15 NOTE — ED Notes (Signed)
Pt has returned from being out of the department; pt placed back on monitor, continuous pulse oximetry and blood pressure cuff 

## 2012-06-15 NOTE — ED Notes (Signed)
Pt standing up in room, reports he is not feeling SOB anymore. Pt in nad, skin warm and dry, resp e/u. VSS.

## 2012-06-15 NOTE — ED Notes (Signed)
Pt aware of need of urine specimen, pt cannot go at this time; urinal at bedside

## 2012-06-15 NOTE — ED Provider Notes (Signed)
History     CSN: [id number]  Arrival date & time 06/15/12  0907   First MD Initiated Contact with Patient 06/15/12 0915      Chief Complaint  Patient presents with  . Shortness of Breath    (Consider location/radiation/quality/duration/timing/severity/associated sxs/prior treatment) HPI Comments: Pt presents to the ED for SOB and intermittent diffuse chest tightness x 2 days.  Chest pain is not associated with exertion but is exacerbated by heavy bouts of productive coughing. Also mentions several episodes of dizziness which he thinks is related to abrupt movements but is concerned it may be more serious.  Recent cardiac cath with BMS insertion.  Started on plavix, prilosec d/c temporarily. Denies any abdominal pain, dysuria, fever, chills, sweats, headaches, AMS or confusion. No sick exposures.  Patient is a 72 y.o. male presenting with shortness of breath. The history is provided by the patient.  Shortness of Breath Associated symptoms: chest pain     Past Medical History  Diagnosis Date  . GERD (gastroesophageal reflux disease)   . HLD (hyperlipidemia)   . HTN (hypertension)   . Tobacco abuse   . Cough secondary to angiotensin converting enzyme inhibitor (ACE-I)   . Rotator cuff injury   . Angina   . Peripheral vascular disease   . CAD (coronary artery disease)     A. Negative MV 04/2010;  B. Inadequate Stress Echo 05/2011;  C. 05/29/11 Cath: LM-NL, LAD-12m, LCX- 40p, OM1 small 122m, OM2 90, RCA 40-50p, 72m, EF 55-65% - Med Rx   . Iron deficiency anemia   . DVT (deep venous thrombosis)   . Shortness of breath   . Microcytic anemia   . COPD (chronic obstructive pulmonary disease)     Past Surgical History  Procedure Laterality Date  . Iliac artery aneurysm repair    . Circumcision    . Pr vein bypass graft,aorto-fem-pop  10/01/2009    left below knee popliteal artery to posterior tibial artery  . Cardiac catheterization      Family History  Problem Relation Age of  Onset  . Breast cancer Mother     deceased  . Cancer Mother   . Hyperlipidemia Mother   . Hypertension Mother   . COPD Father     deceased  . Cancer Father   . Hyperlipidemia Father   . Hypertension Father   . Hypertension Sister   . Cancer Brother   . Hypertension Brother   . Hypertension Daughter   . Hypertension Son     History  Substance Use Topics  . Smoking status: Former Smoker -- 1.00 packs/day for 55 years    Types: Cigarettes    Quit date: 05/26/2011  . Smokeless tobacco: Never Used  . Alcohol Use: No      Review of Systems  Respiratory: Positive for shortness of breath.   Cardiovascular: Positive for chest pain.  Neurological: Positive for dizziness.  All other systems reviewed and are negative.    Allergies  Hydrocodone; Imdur; Lisinopril; and Topiramate  Home Medications   Current Outpatient Rx  Name  Route  Sig  Dispense  Refill  . albuterol (PROVENTIL HFA;VENTOLIN HFA) 108 (90 BASE) MCG/ACT inhaler   Inhalation   Inhale 2 puffs into the lungs every 6 (six) hours as needed. Wheezing and shortness of breath         . amLODipine (NORVASC) 5 MG tablet   Oral   Take 5 mg by mouth daily before breakfast.         .  aspirin 81 MG tablet   Oral   Take 81 mg by mouth 2 (two) times daily.          Marland Kitchen atenolol (TENORMIN) 25 MG tablet   Oral   Take 25 mg by mouth daily before breakfast.         . clopidogrel (PLAVIX) 75 MG tablet   Oral   Take 1 tablet (75 mg total) by mouth daily with breakfast.   30 tablet   0   . ferrous sulfate 325 (65 FE) MG tablet   Oral   Take 325 mg by mouth daily with breakfast.         . Iron-Vitamins (GERITOL) LIQD   Oral   Take 5 mLs by mouth every other day.         . losartan-hydrochlorothiazide (HYZAAR) 100-25 MG per tablet   Oral   Take 1 tablet by mouth daily before breakfast. HOLD for 2 days, restart on 06/03/2012.   30 tablet   11   . nitroGLYCERIN (NITROSTAT) 0.4 MG SL tablet    Sublingual   Place 1 tablet (0.4 mg total) under the tongue every 5 (five) minutes as needed for chest pain.   25 tablet   3   . omeprazole (PRILOSEC) 20 MG capsule      ON HOLD Starting 06/12/12         . simvastatin (ZOCOR) 20 MG tablet   Oral   Take 1 tablet (20 mg total) by mouth at bedtime.   30 tablet   11   . traMADol (ULTRAM) 50 MG tablet   Oral   Take 50 mg by mouth every 6 (six) hours as needed. For pain. Maximum dose= 8 tablets per day           BP 152/75  Pulse 60  Temp(Src) 97.8 F (36.6 C) (Oral)  Resp 20  Ht 5\' 11"  (1.803 m)  Wt 237 lb (107.502 kg)  BMI 33.07 kg/m2  SpO2 100%  Physical Exam  Nursing note and vitals reviewed. Constitutional: He is oriented to person, place, and time. He appears well-developed and well-nourished.  HENT:  Head: Normocephalic and atraumatic.  Mouth/Throat: Oropharynx is clear and moist.  Eyes: Conjunctivae and EOM are normal. Pupils are equal, round, and reactive to light.  Neck: Normal range of motion. Neck supple.  Cardiovascular: Normal rate, regular rhythm and normal heart sounds.   Pulmonary/Chest: Effort normal and breath sounds normal. No respiratory distress. He has no wheezes. He exhibits no tenderness.  Abdominal: Soft. Bowel sounds are normal. There is no tenderness. There is no CVA tenderness, no tenderness at McBurney's point and negative Murphy's sign.  Musculoskeletal: Normal range of motion. He exhibits no edema.  No LE edema or calf tenderness  Lymphadenopathy:    He has no cervical adenopathy.  Neurological: He is alert and oriented to person, place, and time.  Skin: Skin is warm and dry.  Psychiatric: He has a normal mood and affect.    ED Course  Procedures (including critical care time)   Date: 06/15/2012  Rate: 54  Rhythm: sinus bradycardia  QRS Axis: normal  Intervals: PR prolonged  ST/T Wave abnormalities: normal  Conduction Disutrbances:first-degree A-V block   Narrative  Interpretation:   Old EKG Reviewed: unchanged   Labs Reviewed  CBC WITH DIFFERENTIAL - Abnormal; Notable for the following:    WBC 3.7 (*)    Hemoglobin 11.3 (*)    HCT 34.9 (*)    MCV 68.2 (*)  MCH 22.1 (*)    RDW 16.4 (*)    Eosinophils Relative 7 (*)    All other components within normal limits  COMPREHENSIVE METABOLIC PANEL - Abnormal; Notable for the following:    Glucose, Bld 106 (*)    Creatinine, Ser 1.36 (*)    Total Bilirubin 0.2 (*)    GFR calc non Af Amer 50 (*)    GFR calc Af Amer 58 (*)    All other components within normal limits  TROPONIN I  URINALYSIS, ROUTINE W REFLEX MICROSCOPIC   Dg Chest 2 View  06/15/2012  *RADIOLOGY REPORT*  Clinical Data: Cough and shortness of breath.  CHEST - 2 VIEW  Comparison: Chest x-ray 05/18/2012.  Findings: Lung volumes are normal.  No consolidative airspace disease.  No pleural effusions.  No pneumothorax.  No pulmonary nodule or mass noted.  Pulmonary vasculature and the cardiomediastinal silhouette are within normal limits. Atherosclerotic calcifications in the thoracic aorta.  IMPRESSION: 1. No radiographic evidence of acute cardiopulmonary disease. 2.  Atherosclerosis.   Original Report Authenticated By: Trudie Reed, M.D.      1. Chest pain   2. Shortness of breath       MDM    72 y.o. Male presenting to the ED for intermittent SOB.  Pt also notes that he has some diffuse chest tightness and dizzy spells associated with heavy bouts of coughing.  Recent cardiac cath and stent placement on 05/31/12- no complications noted.  Cardiac work-up negative.  Pt remained asx while in the ED.  Cardiology evaluated pt in ED and will admit for close observation.       Garlon Hatchet, PA-C 06/15/12 1551

## 2012-06-15 NOTE — ED Provider Notes (Signed)
72 year old male with a history of COPD, and a long time history of tobacco use, who has seen cardiology recently and underwent cardiac catheterization in the last 2 weeks. He had stents placed. He describes his symptoms today shortness of breath and central to right sided chest heaviness. This is also intermittent, it is becoming more steady over time, he does not have associated fevers or swelling of his legs though he does have a chronic cough related to his COPD.On exam the patient has clear heart and lung sounds, no ectopy, no murmurs, no peripheral edema. His EKG does not show any signs of acute ischemia and his chest x-ray does not show any specific findings. Laboratory values pending, will need to discuss with cardiology, Dr. Riley Kill is the patient's cardiologist.  D/w MLP with Cardiology who will have cardiology see in ED.  pt appears stable without acute ischemia on ECG, trop neg.  Dg Chest 2 View  06/15/2012  *RADIOLOGY REPORT*  Clinical Data: Cough and shortness of breath.  CHEST - 2 VIEW  Comparison: Chest x-ray 05/18/2012.  Findings: Lung volumes are normal.  No consolidative airspace disease.  No pleural effusions.  No pneumothorax.  No pulmonary nodule or mass noted.  Pulmonary vasculature and the cardiomediastinal silhouette are within normal limits. Atherosclerotic calcifications in the thoracic aorta.  IMPRESSION: 1. No radiographic evidence of acute cardiopulmonary disease. 2.  Atherosclerosis.   Original Report Authenticated By: Trudie Reed, M.D.     Vida Roller, MD 06/16/12 0430

## 2012-06-15 NOTE — ED Notes (Signed)
Pt received from POD A. Pt stating, he does not want to stay the night. Told pt we wanted to get results from second blood test and re-evaluate status. Pt agreeable.

## 2012-06-15 NOTE — ED Notes (Signed)
Patient complains of shortness of breath since yesterday.  Patient advised he uses inhalers at home, but is out.   Patient says it is intermittent.

## 2012-06-15 NOTE — H&P (Signed)
[name] MRN: [id number], DOB/AGE: June 26, 1940   Admit date: 06/15/2012   Primary Physician: Katy Apo, MD Primary Cardiologist: T. Riley Kill, MD  Pt. Profile:  72 y/o male with h/o CAD s/p recent PCI/BMS to the OM2, who presented to the ED today 2/2 intermittent difficulty breathing.  Problem List  Past Medical History  Diagnosis Date  . GERD (gastroesophageal reflux disease)   . HLD (hyperlipidemia)   . HTN (hypertension)   . Tobacco abuse   . Cough secondary to angiotensin converting enzyme inhibitor (ACE-I)   . Rotator cuff injury   . Angina   . Peripheral vascular disease   . CAD (coronary artery disease)     A. Negative MV 04/2010;  B. Inadequate Stress Echo 05/2011;  C. 05/29/11 Cath: - Med Rx;  D. 05/2012 Cath/PCI: LM nl, LAD 40p, LCX ectatic, OM1 177m, OM2 90p (3.5x16 Veriflex BMS), RCA dominant 40p/d, PDA 50p, EF 55-65%.  . Iron deficiency anemia   . DVT (deep venous thrombosis)   . Shortness of breath   . Microcytic anemia   . COPD (chronic obstructive pulmonary disease)     Past Surgical History  Procedure Laterality Date  . Iliac artery aneurysm repair    . Circumcision    . Pr vein bypass graft,aorto-fem-pop  10/01/2009    left below knee popliteal artery to posterior tibial artery  . Cardiac catheterization      Allergies  Allergies  Allergen Reactions  . Hydrocodone     Shortness of breath and leg cramps  . Imdur (Isosorbide Mononitrate) Nausea Only    Headache  . Lisinopril     REACTION: Cough  . Topiramate     Shortness of breath and leg cramps   HPI  72 year old male with the above problem list. Approximately 3 weeks ago, he was seen by Dr. Riley Kill in clinic and complained of left-sided chest discomfort. He subsequently underwent diagnostic catheterization revealing significant obtuse marginal disease in the second obtuse marginal was stented with a bare-metal stent. Patient tolerated this procedure well and was discharged  the following day. He did have one episode of sharp chest pain within 4 days of discharge for which, he did not seek medical attention. He was seen by Dr. Riley Kill in the office on March 3 at which time he was doing well.  Beginning yesterday afternoon, he started to experience intermittent episodes of difficulty with getting a good breath. He explicitly denies experiencing chest pain or dyspnea and simply says he felt like he couldn't get a good breath. The initial episode occurred while at rest and lasted 30 minutes prior to resolving spontaneously. The second episode occurred late last night, again lasting about 30 minutes, and resolving spontaneously.  This morning after taking some clothes out to his car to be taken to the dry cleaners, he again felt as though he is having difficulty breathing and also became somewhat dizzy. Again, he denies that he experienced significant shortness of breath. Because of recurrence of symptoms and associated dizziness, he decided to present to the ED today. Here, his ECG is nonacute and his initial troponin is normal. He currently denies chest pain, dyspnea, or any difficulty breathing. Of note, his prior anginal equivalent was left-sided chest pain and pressure.   Home Medications  Prior to Admission medications   Medication Sig Start Date End Date Taking? Authorizing Provider  albuterol (PROVENTIL HFA;VENTOLIN HFA) 108 (90 BASE) MCG/ACT inhaler Inhale 2 puffs into the lungs every 6 (six)  hours as needed for wheezing. Wheezing and shortness of breath   Yes Historical Provider, MD  amLODipine (NORVASC) 5 MG tablet Take 5 mg by mouth daily before breakfast. 12/24/11 12/23/12 Yes Herby Abraham, MD  aspirin 81 MG tablet Take 81 mg by mouth 2 (two) times daily.    Yes Historical Provider, MD  atenolol (TENORMIN) 25 MG tablet Take 25 mg by mouth daily before breakfast.   Yes Historical Provider, MD  clopidogrel (PLAVIX) 75 MG tablet Take 1 tablet (75 mg total) by mouth  daily with breakfast. 06/01/12  Yes Rhonda G Barrett, PA-C  Iron-Vitamins (GERITOL) LIQD Take 5 mLs by mouth every other day.   Yes Historical Provider, MD  losartan-hydrochlorothiazide (HYZAAR) 100-25 MG per tablet Take 1 tablet by mouth daily before breakfast. HOLD for 2 days, restart on 06/03/2012. 06/01/12  Yes Rhonda G Barrett, PA-C  simvastatin (ZOCOR) 20 MG tablet Take 1 tablet (20 mg total) by mouth at bedtime. 06/01/12  Yes Rhonda G Barrett, PA-C  traMADol (ULTRAM) 50 MG tablet Take 50 mg by mouth every 6 (six) hours as needed. For pain. Maximum dose= 8 tablets per day   Yes Historical Provider, MD  ferrous sulfate 325 (65 FE) MG tablet Take 325 mg by mouth daily with breakfast.    Historical Provider, MD  nitroGLYCERIN (NITROSTAT) 0.4 MG SL tablet Place 1 tablet (0.4 mg total) under the tongue every 5 (five) minutes as needed for chest pain. 06/01/12   Joline Salt Barrett, PA-C  omeprazole (PRILOSEC) 20 MG capsule ON HOLD Starting 06/12/12 06/12/12   Herby Abraham, MD   Family History  Family History  Problem Relation Age of Onset  . Breast cancer Mother     deceased  . Cancer Mother   . Hyperlipidemia Mother   . Hypertension Mother   . COPD Father     deceased  . Cancer Father   . Hyperlipidemia Father   . Hypertension Father   . Hypertension Sister   . Cancer Brother   . Hypertension Brother   . Hypertension Daughter   . Hypertension Son    Social History  History   Social History  . Marital Status: Widowed    Spouse Name: N/A    Number of Children: N/A  . Years of Education: N/A   Occupational History  . Retired    Social History Main Topics  . Smoking status: Former Smoker -- 1.00 packs/day for 55 years    Types: Cigarettes    Quit date: 05/26/2011  . Smokeless tobacco: Never Used  . Alcohol Use: No  . Drug Use: No  . Sexually Active: Not on file   Other Topics Concern  . 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  Lives in [place] by himself.    Review of Systems General:  No chills, fever, night sweats or weight changes.  Cardiovascular:  3 episodes of difficulty breathing/"getting a breath" as outlined above.  The last episode was associated with dizziness.  No chest pain, dyspnea on exertion, edema, orthopnea, palpitations, paroxysmal nocturnal dyspnea. Dermatological: No rash, lesions/masses Respiratory: No cough, dyspnea Urologic: No hematuria, dysuria Abdominal:   No nausea, vomiting, diarrhea, bright red blood per rectum, melena, or hematemesis Neurologic:  No visual changes, wkns, changes in mental status. All other systems reviewed and are otherwise negative except as noted above.  Physical Exam  Blood pressure 143/66, pulse 45, temperature 97.8 F (36.6 C),  temperature source Oral, resp. rate 14, height 5\' 11"  (1.803 m), weight 237 lb (107.502 kg), SpO2 99.00%.  General: Pleasant, NAD Psych: Normal affect. Neuro: Alert and oriented X 3. Moves all extremities spontaneously. HEENT: Normal  Neck: Supple without bruits or JVD. Lungs:  Resp regular and unlabored, diminished breath sounds R>L.Marland Kitchen Heart: RRR, distant, no s3, s4, or murmurs. Abdomen: Soft, non-tender, non-distended, BS + x 4.  Extremities: No clubbing, cyanosis or edema. DP/PT/Radials 1+ and equal bilaterally.  Labs   Recent Labs  06/15/12 0950  TROPONINI <0.30   Lab Results  Component Value Date   WBC 3.7* 06/15/2012   HGB 11.3* 06/15/2012   HCT 34.9* 06/15/2012   MCV 68.2* 06/15/2012   PLT 158 06/15/2012     Recent Labs Lab 06/15/12 0949  NA 140  K 3.7  CL 101  CO2 29  BUN 14  CREATININE 1.36*  CALCIUM 9.9  PROT 6.9  BILITOT 0.2*  ALKPHOS 70  ALT 20  AST 18  GLUCOSE 106*   Lab Results  Component Value Date   CHOL 144 05/31/2011   HDL 35* 05/31/2011   LDLCALC 88 05/31/2011   TRIG 103 05/31/2011    Radiology/Studies  Dg Chest 2 View  06/15/2012  *RADIOLOGY REPORT*  Clinical Data: Cough and  shortness of breath.  CHEST - 2 VIEW  Comparison: Chest x-ray 05/18/2012.  Findings: Lung volumes are normal.  No consolidative airspace disease.  No pleural effusions.  No pneumothorax.  No pulmonary nodule or mass noted.  Pulmonary vasculature and the cardiomediastinal silhouette are within normal limits. Atherosclerotic calcifications in the thoracic aorta.  IMPRESSION: 1. No radiographic evidence of acute cardiopulmonary disease. 2.  Atherosclerosis.   Original Report Authenticated By: Trudie Reed, M.D.    ECG  Sb, 54, 1st deg avb, lvh, no acute st/t changes.  ASSESSMENT AND PLAN  1.  Difficulty breathing:  As outlined above, pt says that he was not necessarily short of breath, simply that he felt like he couldn't get a good breath.  He never experienced significant distress.  He has not had any chest discomfort.  ECG and initial troponin are nl, allowing for baseline bradycardia.  CXR shows no acute findings.  He is currently symptom free.  Repeat troponin.  If nl, plan to d/c from ED.  2.  CAD:  As above, no chest pain (prior anginal equivalent).  ECG w/o acute findings and troponin nl so far.  Cont asa, plavix, bb, statin.  3.  HTN:  BP slightly elevated in ER.  Cont home meds and follow.  4.  HL:  LDL 88 on 2/18.  LFT's ok.  Cont statin Rx.  5.  Iron Deficiency Anemia:  H/H stable.  Cont FeSO4 therapy.  6.  COPD:  ? Contribution to Ss.  He is not actively wheezing.  Cont prn inhaler.  Signed, Nicolasa Ducking, NP 06/15/2012, 1:36 PM  Patient seen, examined. Available data reviewed. Agree with findings, assessment, and plan as outlined by Ward Givens, NP. Exam reveals a very pleasant gentleman in NAD. Lungs are clear and heart is RRR without murmur or gallop. There is no peripheral edema and JVP is normal. Labs, CXR, and EKG reviewed and there are no significant abnormalities noted. He describes an unusual feeling of inability to catch breath or take a deep breath, but he hasn't had  pain or shortness of breath with exertion. He is currently comfortable without symptoms and resting heart rate is 52 bpm. I think the  chance of either PE or ACS is very low. In addition, his d-dimer is chronically elevated and would likely lead to further testing and radiation exposure (he had negative CT angios of the chest in May and June 2013. Recommend check another troponin at 6 hour interval and discharge home if negative. Follow-up with Dr Riley Kill as planned. His dyspnea has characteristics of medication-related and this was reported in 8% of patients in [place] who received plavix - possible that this is plavix-related. He was treated with a BMS in February and this could potentially be discontinue when he sees Dr Riley Kill back in follow-up.  Tonny Bollman, M.D. 06/15/2012 2:07 PM

## 2012-06-16 NOTE — ED Provider Notes (Signed)
Medical screening examination/treatment/procedure(s) were conducted as a shared visit with non-physician practitioner(s) and myself.  I personally evaluated the patient during the encounter  Please see my separate respective documentation pertaining to this patient encounter   Vida Roller, MD 06/16/12 0430

## 2012-06-28 ENCOUNTER — Ambulatory Visit: Payer: Medicare Other | Admitting: Cardiology

## 2012-06-29 ENCOUNTER — Encounter: Payer: Self-pay | Admitting: Cardiology

## 2012-06-29 ENCOUNTER — Ambulatory Visit (INDEPENDENT_AMBULATORY_CARE_PROVIDER_SITE_OTHER): Payer: Medicare Other | Admitting: Cardiology

## 2012-06-29 VITALS — BP 130/66 | HR 52 | Ht 70.0 in | Wt 231.0 lb

## 2012-06-29 DIAGNOSIS — I10 Essential (primary) hypertension: Secondary | ICD-10-CM

## 2012-06-29 DIAGNOSIS — I2510 Atherosclerotic heart disease of native coronary artery without angina pectoris: Secondary | ICD-10-CM

## 2012-06-29 DIAGNOSIS — D509 Iron deficiency anemia, unspecified: Secondary | ICD-10-CM

## 2012-06-29 LAB — BASIC METABOLIC PANEL
BUN: 15 mg/dL (ref 6–23)
CO2: 30 mEq/L (ref 19–32)
Calcium: 9.6 mg/dL (ref 8.4–10.5)
Chloride: 99 mEq/L (ref 96–112)
Creatinine, Ser: 1.3 mg/dL (ref 0.4–1.5)
GFR: 70.93 mL/min (ref >60.00)
Glucose, Bld: 97 mg/dL (ref 70–99)
Potassium: 4.3 mEq/L (ref 3.5–5.1)
Sodium: 137 mEq/L (ref 135–145)

## 2012-06-29 LAB — CBC
HCT: 37.3 % — ABNORMAL LOW (ref 39.0–52.0)
Hemoglobin: 11.8 g/dL — ABNORMAL LOW (ref 13.0–17.0)
MCHC: 31.7 g/dL (ref 30.0–36.0)
MCV: 70.2 fl — ABNORMAL LOW (ref 78.0–100.0)
Platelets: 201 10*3/uL (ref 150.0–400.0)
RBC: 5.32 Mil/uL (ref 4.22–5.81)
RDW: 17.2 % — ABNORMAL HIGH (ref 11.5–14.6)
WBC: 4.2 10*3/uL — ABNORMAL LOW (ref 4.5–10.5)

## 2012-06-29 NOTE — Progress Notes (Signed)
HPI:  This nice gentleman seen today in a followup visit. Approximately one month ago he underwent implantation bare-metal stent in the midportion of the second obtuse marginal branch.  He came to the emergency room once in the interim, but overall is been getting along well, and is back in church singing. Our plan was continue him on dual antiplatelet therapy for one month, principally because of chronic microcytic anemia. He has done pretty well with this, and he is on his last pill. In contrast, he has had a lot of gastroesophageal reflux type symptoms, and he is pretty clear that that is what is causing his current symptoms. He wants to go back on his omeprazole, and I told him he can do this today as well.  Current Outpatient Prescriptions  Medication Sig Dispense Refill  . albuterol (PROVENTIL HFA;VENTOLIN HFA) 108 (90 BASE) MCG/ACT inhaler Inhale 2 puffs into the lungs every 6 (six) hours as needed for wheezing. Wheezing and shortness of breath      . amLODipine (NORVASC) 5 MG tablet Take 5 mg by mouth daily before breakfast.      . aspirin 81 MG tablet Take 81 mg by mouth 2 (two) times daily.       Marland Kitchen atenolol (TENORMIN) 25 MG tablet Take 25 mg by mouth daily before breakfast.      . clopidogrel (PLAVIX) 75 MG tablet Take 1 tablet (75 mg total) by mouth daily with breakfast.  30 tablet  0  . ferrous sulfate 325 (65 FE) MG tablet Take 325 mg by mouth daily with breakfast.      . Iron-Vitamins (GERITOL) LIQD Take 5 mLs by mouth every other day.      . losartan-hydrochlorothiazide (HYZAAR) 100-25 MG per tablet Take 1 tablet by mouth daily before breakfast.      . nitroGLYCERIN (NITROSTAT) 0.4 MG SL tablet Place 1 tablet (0.4 mg total) under the tongue every 5 (five) minutes as needed for chest pain.  25 tablet  3  . simvastatin (ZOCOR) 20 MG tablet Take 1 tablet (20 mg total) by mouth at bedtime.  30 tablet  11  . traMADol (ULTRAM) 50 MG tablet Take 50 mg by mouth every 6 (six) hours as needed.  For pain. Maximum dose= 8 tablets per day      . omeprazole (PRILOSEC) 20 MG capsule ON HOLD Starting 06/12/12       No current facility-administered medications for this visit.    Allergies  Allergen Reactions  . Hydrocodone     Shortness of breath and leg cramps  . Imdur (Isosorbide Mononitrate) Nausea Only    Headache  . Lisinopril     REACTION: Cough  . Topiramate     Shortness of breath and leg cramps    Past Medical History  Diagnosis Date  . GERD (gastroesophageal reflux disease)   . HLD (hyperlipidemia)   . HTN (hypertension)   . Tobacco abuse   . Cough secondary to angiotensin converting enzyme inhibitor (ACE-I)   . Rotator cuff injury   . Angina   . Peripheral vascular disease   . CAD (coronary artery disease)     A. Negative MV 04/2010;  B. Inadequate Stress Echo 05/2011;  C. 05/29/11 Cath: - Med Rx;  D. 05/2012 Cath/PCI: LM nl, LAD 40p, LCX ectatic, OM1 178m, OM2 90p (3.5x16 Veriflex BMS), RCA dominant 40p/d, PDA 50p, EF 55-65%.  . Iron deficiency anemia   . DVT (deep venous thrombosis)   . Shortness of  breath   . Microcytic anemia   . COPD (chronic obstructive pulmonary disease)     Past Surgical History  Procedure Laterality Date  . Iliac artery aneurysm repair    . Circumcision    . Pr vein bypass graft,aorto-fem-pop  10/01/2009    left below knee popliteal artery to posterior tibial artery  . Cardiac catheterization      Family History  Problem Relation Age of Onset  . Breast cancer Mother     deceased  . Cancer Mother   . Hyperlipidemia Mother   . Hypertension Mother   . COPD Father     deceased  . Cancer Father   . Hyperlipidemia Father   . Hypertension Father   . Hypertension Sister   . Cancer Brother   . Hypertension Brother   . Hypertension Daughter   . Hypertension Son     History   Social History  . Marital Status: Widowed    Spouse Name: N/A    Number of Children: N/A  . Years of Education: N/A   Occupational History  .  Retired    Social History Main Topics  . Smoking status: Former Smoker -- 1.00 packs/day for 55 years    Types: Cigarettes    Quit date: 05/26/2011  . Smokeless tobacco: Never Used  . Alcohol Use: No  . Drug Use: No  . Sexually Active: Not on file   Other Topics Concern  . 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  Lives in [place] by himself.    ROS: Please see the HPI.  All other systems reviewed and negative.  PHYSICAL EXAM:  BP 130/66  Pulse 52  Ht 5\' 10"  (1.778 m)  Wt 231 lb (104.781 kg)  BMI 33.15 kg/m2  General: Well developed, well nourished, in no acute distress. Head:  Normocephalic and atraumatic. Neck: no JVD Lungs: Clear to auscultation and percussion. Heart: Normal S1 and S2.  No murmur, rubs or gallops.  Pulses: Pulses normal in all 4 extremities. Extremities: No clubbing or cyanosis. No edema. Neurologic: Alert and oriented x 3.  EKG:  None today.  ASSESSMENT AND PLAN:  1.  SP PCI with BMS in second OM -- continue medical therapy.  2.  RTC in three weeks, 3.  DC plavix 4.  Resume omeprazole for GERD.    Will transition to Dr. [place].

## 2012-06-29 NOTE — Patient Instructions (Addendum)
Your physician recommends that you schedule a follow-up appointment in: April with Dr Riley Kill  Your physician recommends that you have lab work today: CBC and BMP  Your physician has recommended you make the following change in your medication: STOP Plavix, Resume Prilosec

## 2012-07-02 NOTE — Assessment & Plan Note (Signed)
Recent intervention for which he seems to be doing well.  Will see back just before I depart, and transition to Dr. [place].

## 2012-07-02 NOTE — Assessment & Plan Note (Signed)
Controlled at present.  

## 2012-07-02 NOTE — Assessment & Plan Note (Signed)
Watch very closely.

## 2012-07-17 ENCOUNTER — Other Ambulatory Visit: Payer: Self-pay

## 2012-07-17 MED ORDER — ATENOLOL 25 MG PO TABS: 25.0000 mg | ORAL_TABLET | Freq: Every day | ORAL | Status: DC

## 2012-07-18 ENCOUNTER — Other Ambulatory Visit: Payer: Self-pay | Admitting: *Deleted

## 2012-07-18 MED ORDER — AMLODIPINE BESYLATE 5 MG PO TABS: 5.0000 mg | ORAL_TABLET | Freq: Every day | ORAL | Status: DC

## 2012-07-27 ENCOUNTER — Ambulatory Visit: Payer: Medicare Other | Admitting: Cardiology

## 2012-07-27 ENCOUNTER — Ambulatory Visit: Payer: Medicare Other | Admitting: Neurosurgery

## 2012-08-14 ENCOUNTER — Emergency Department (HOSPITAL_COMMUNITY): Payer: Medicare Other

## 2012-08-14 ENCOUNTER — Emergency Department (HOSPITAL_COMMUNITY)
Admission: EM | Admit: 2012-08-14 | Discharge: 2012-08-15 | Disposition: A | Discharge status: Home / Self Care | Payer: Medicare Other | Attending: Emergency Medicine | Admitting: Emergency Medicine

## 2012-08-14 ENCOUNTER — Encounter (HOSPITAL_COMMUNITY): Payer: Self-pay

## 2012-08-14 DIAGNOSIS — Z79899 Other long term (current) drug therapy: Secondary | ICD-10-CM | POA: Insufficient documentation

## 2012-08-14 DIAGNOSIS — J4489 Other specified chronic obstructive pulmonary disease: Secondary | ICD-10-CM | POA: Insufficient documentation

## 2012-08-14 DIAGNOSIS — K219 Gastro-esophageal reflux disease without esophagitis: Secondary | ICD-10-CM | POA: Insufficient documentation

## 2012-08-14 DIAGNOSIS — E785 Hyperlipidemia, unspecified: Secondary | ICD-10-CM | POA: Insufficient documentation

## 2012-08-14 DIAGNOSIS — I2510 Atherosclerotic heart disease of native coronary artery without angina pectoris: Secondary | ICD-10-CM | POA: Insufficient documentation

## 2012-08-14 DIAGNOSIS — I739 Peripheral vascular disease, unspecified: Secondary | ICD-10-CM | POA: Insufficient documentation

## 2012-08-14 DIAGNOSIS — J449 Chronic obstructive pulmonary disease, unspecified: Secondary | ICD-10-CM | POA: Insufficient documentation

## 2012-08-14 DIAGNOSIS — R0602 Shortness of breath: Secondary | ICD-10-CM | POA: Insufficient documentation

## 2012-08-14 DIAGNOSIS — Z87891 Personal history of nicotine dependence: Secondary | ICD-10-CM | POA: Insufficient documentation

## 2012-08-14 DIAGNOSIS — Z86718 Personal history of other venous thrombosis and embolism: Secondary | ICD-10-CM | POA: Insufficient documentation

## 2012-08-14 DIAGNOSIS — I10 Essential (primary) hypertension: Secondary | ICD-10-CM | POA: Insufficient documentation

## 2012-08-14 DIAGNOSIS — Z7982 Long term (current) use of aspirin: Secondary | ICD-10-CM | POA: Insufficient documentation

## 2012-08-14 DIAGNOSIS — Z888 Allergy status to other drugs, medicaments and biological substances status: Secondary | ICD-10-CM | POA: Insufficient documentation

## 2012-08-14 DIAGNOSIS — R079 Chest pain, unspecified: Secondary | ICD-10-CM | POA: Insufficient documentation

## 2012-08-14 DIAGNOSIS — Z885 Allergy status to narcotic agent status: Secondary | ICD-10-CM | POA: Insufficient documentation

## 2012-08-14 DIAGNOSIS — I209 Angina pectoris, unspecified: Secondary | ICD-10-CM | POA: Insufficient documentation

## 2012-08-14 DIAGNOSIS — D509 Iron deficiency anemia, unspecified: Secondary | ICD-10-CM | POA: Insufficient documentation

## 2012-08-14 LAB — CBC
HCT: 34.1 % — ABNORMAL LOW (ref 39.0–52.0)
Hemoglobin: 11.5 g/dL — ABNORMAL LOW (ref 13.0–17.0)
MCH: 22.5 pg — ABNORMAL LOW (ref 26.0–34.0)
MCHC: 33.7 g/dL (ref 30.0–36.0)
MCV: 66.7 fL — ABNORMAL LOW (ref 78.0–100.0)
Platelets: 151 10*3/uL (ref 150–400)
RBC: 5.11 MIL/uL (ref 4.22–5.81)
RDW: 16.3 % — ABNORMAL HIGH (ref 11.5–15.5)
WBC: 4.8 10*3/uL (ref 4.0–10.5)

## 2012-08-14 LAB — BASIC METABOLIC PANEL
BUN: 20 mg/dL (ref 6–23)
CO2: 26 mEq/L (ref 19–32)
Calcium: 9.8 mg/dL (ref 8.4–10.5)
Chloride: 101 mEq/L (ref 96–112)
Creatinine, Ser: 1.47 mg/dL — ABNORMAL HIGH (ref 0.50–1.35)
GFR calc Af Amer: 53 mL/min — ABNORMAL LOW (ref >90)
GFR calc non Af Amer: 46 mL/min — ABNORMAL LOW (ref >90)
Glucose, Bld: 95 mg/dL (ref 70–99)
Potassium: 3.8 mEq/L (ref 3.5–5.1)
Sodium: 138 mEq/L (ref 135–145)

## 2012-08-14 LAB — POCT I-STAT TROPONIN I: Troponin i, poc: 0 ng/mL (ref 0.00–0.08)

## 2012-08-14 LAB — PRO B NATRIURETIC PEPTIDE: Pro B Natriuretic peptide (BNP): 82.7 pg/mL (ref 0–125)

## 2012-08-14 NOTE — ED Notes (Signed)
Patient presents with left sided chest pain. Initital onset 2 days ago. Pain has been intermittent and occurs at rest. Has used nitroglycerin SL with some relief. Associated with SOB and headache. Denies diaphoresis, leg swelling, N/V, abdominal pain, jaw pain, arm pain, shoulder pain or back pain. Patient has extensive cardiac hx. Cardiac Cath 05/2012.

## 2012-08-14 NOTE — ED Provider Notes (Signed)
History     CSN: [id number]  Arrival date & time 08/14/12  2201   First MD Initiated Contact with Patient 08/14/12 2305      Chief Complaint  Patient presents with  . Chest Pain    (Consider location/radiation/quality/duration/timing/severity/associated sxs/prior treatment) HPI 72 year old male presents to the emergency department with complaint of left-sided chest pain ongoing for the last 3 nights.  Patient reports pain is left-sided, 90 in nature.  He took Ultram on Saturday, and Sunday, with improvement in symptoms.  Patient also took nitroglycerin last 2 nights.  Tonight, symptoms were associated with shortness of breath, which was slightly different.  He took nitroglycerin without improvement.  He reports symptoms have since resolved.  Patient has history of coronary disease, is followed by [facility] [place] [facility].  His last heart catheterization was in February at which time he received a bare-metal stent to his second OM.patient also has history of GERD, and COPD.  He denies any cough, or change in sputum.  He's been on his omeprazole and denies any current GERD symptoms. Past Medical History  Diagnosis Date  . GERD (gastroesophageal reflux disease)   . HLD (hyperlipidemia)   . HTN (hypertension)   . Tobacco abuse   . Cough secondary to angiotensin converting enzyme inhibitor (ACE-I)   . Rotator cuff injury   . Angina   . Peripheral vascular disease   . CAD (coronary artery disease)     A. Negative MV 04/2010;  B. Inadequate Stress Echo 05/2011;  C. 05/29/11 Cath: - Med Rx;  D. 05/2012 Cath/PCI: LM nl, LAD 40p, LCX ectatic, OM1 172m, OM2 90p (3.5x16 Veriflex BMS), RCA dominant 40p/d, PDA 50p, EF 55-65%.  . Iron deficiency anemia   . DVT (deep venous thrombosis)   . Shortness of breath   . Microcytic anemia   . COPD (chronic obstructive pulmonary disease)     Past Surgical History  Procedure Laterality Date  . Iliac artery aneurysm repair    . Circumcision    . Pr vein bypass  graft,aorto-fem-pop  10/01/2009    left below knee popliteal artery to posterior tibial artery  . Cardiac catheterization      Family History  Problem Relation Age of Onset  . Breast cancer Mother     deceased  . Cancer Mother   . Hyperlipidemia Mother   . Hypertension Mother   . COPD Father     deceased  . Cancer Father   . Hyperlipidemia Father   . Hypertension Father   . Hypertension Sister   . Cancer Brother   . Hypertension Brother   . Hypertension Daughter   . Hypertension Son     History  Substance Use Topics  . Smoking status: Former Smoker -- 1.00 packs/day for 55 years    Types: Cigarettes    Quit date: 05/26/2011  . Smokeless tobacco: Never Used  . Alcohol Use: No      Review of Systems  Allergies  Hydrocodone; Imdur; Lisinopril; and Topiramate  Home Medications   Current Outpatient Rx  Name  Route  Sig  Dispense  Refill  . albuterol (PROVENTIL HFA;VENTOLIN HFA) 108 (90 BASE) MCG/ACT inhaler   Inhalation   Inhale 2 puffs into the lungs 2 (two) times daily as needed for wheezing or shortness of breath.          Marland Kitchen amLODipine (NORVASC) 5 MG tablet   Oral   Take 5 mg by mouth daily.         Marland Kitchen  aspirin EC 81 MG tablet   Oral   Take 81 mg by mouth 2 (two) times daily.         Marland Kitchen atenolol (TENORMIN) 25 MG tablet   Oral   Take 25 mg by mouth daily.         . ferrous sulfate 325 (65 FE) MG tablet   Oral   Take 325 mg by mouth daily with breakfast.         . Iron-Vitamins (GERITOL) LIQD   Oral   Take 5 mLs by mouth every other day.         . losartan-hydrochlorothiazide (HYZAAR) 100-25 MG per tablet   Oral   Take 1 tablet by mouth daily.          . nitroGLYCERIN (NITROSTAT) 0.4 MG SL tablet   Sublingual   Place 1 tablet (0.4 mg total) under the tongue every 5 (five) minutes as needed for chest pain.   25 tablet   3   . omeprazole (PRILOSEC) 20 MG capsule   Oral   Take 1 capsule (20 mg total) by mouth 2 (two) times daily.          . simvastatin (ZOCOR) 20 MG tablet   Oral   Take 20 mg by mouth daily.         . traMADol (ULTRAM) 50 MG tablet   Oral   Take 50 mg by mouth 3 (three) times daily as needed for pain. For pain. Maximum dose= 8 tablets per day           BP 159/76  Pulse 58  Temp(Src) 97.3 F (36.3 C) (Oral)  SpO2 98%  Physical Exam  Nursing note and vitals reviewed. Constitutional: He is oriented to person, place, and time. He appears well-developed and well-nourished.  HENT:  Head: Normocephalic and atraumatic.  Nose: Nose normal.  Mouth/Throat: Oropharynx is clear and moist.  Eyes: Conjunctivae and EOM are normal. Pupils are equal, round, and reactive to light.  Neck: Normal range of motion. Neck supple. No JVD present. No tracheal deviation present. No thyromegaly present.  Cardiovascular: Normal rate, regular rhythm, normal heart sounds and intact distal pulses.  Exam reveals no gallop and no friction rub.   No murmur heard. Pulmonary/Chest: Effort normal and breath sounds normal. No stridor. No respiratory distress. He has no wheezes. He has no rales. He exhibits no tenderness.  Abdominal: Soft. Bowel sounds are normal. He exhibits no distension and no mass. There is no tenderness. There is no rebound and no guarding.  Musculoskeletal: Normal range of motion. He exhibits no edema and no tenderness.  Lymphadenopathy:    He has no cervical adenopathy.  Neurological: He is alert and oriented to person, place, and time. He exhibits normal muscle tone. Coordination normal.  Skin: Skin is warm and dry. No rash noted. No erythema. No pallor.  Psychiatric: He has a normal mood and affect. His behavior is normal. Judgment and thought content normal.    ED Course  Procedures (including critical care time)  Labs Reviewed  CBC - Abnormal; Notable for the following:    Hemoglobin 11.5 (*)    HCT 34.1 (*)    MCV 66.7 (*)    MCH 22.5 (*)    RDW 16.3 (*)    All other components within  normal limits  BASIC METABOLIC PANEL - Abnormal; Notable for the following:    Creatinine, Ser 1.47 (*)    GFR calc non Af Amer 46 (*)  GFR calc Af Amer 53 (*)    All other components within normal limits  PRO B NATRIURETIC PEPTIDE  POCT I-STAT TROPONIN I   Dg Chest 2 View  08/14/2012  *RADIOLOGY REPORT*  Clinical Data: 72 year old male left side chest pain.  CHEST - 2 VIEW  Comparison: 06/15/2012 and earlier.  Findings: Stable lung volumes with mild eventration of the right hemidiaphragm.  Cardiac size and mediastinal contours are within normal limits.  Visualized tracheal air column is within normal limits.  Lung parenchyma stable and clear.  No pneumothorax or effusion. No acute osseous abnormality identified.  IMPRESSION: No acute cardiopulmonary abnormality.   Original Report Authenticated By: Erskine Speed, M.D.     Date: 08/14/2012  Rate: 58  Rhythm: sinus bradycardia  QRS Axis: normal  Intervals: PR prolonged  ST/T Wave abnormalities: normal  Conduction Disutrbances:first-degree A-V block   Narrative Interpretation:   Old EKG Reviewed: unchanged    1. Chest pain       MDM  72 yo male with three nights of chest pain at rest, h/o CAD, recent bms.  Will d/w his cardiology group.  D/w Dr Shirlee Latch with cardiology, who requests second troponin.  If also negative, feels given recent cath he is stable for discharge and f/u in the office.      Olivia Mackie, MD 08/15/12 [phone]

## 2012-08-15 LAB — POCT I-STAT TROPONIN I: Troponin i, poc: 0 ng/mL (ref 0.00–0.08)

## 2012-08-31 ENCOUNTER — Other Ambulatory Visit: Payer: Self-pay | Admitting: *Deleted

## 2012-08-31 DIAGNOSIS — I739 Peripheral vascular disease, unspecified: Secondary | ICD-10-CM

## 2012-09-07 ENCOUNTER — Other Ambulatory Visit: Payer: Self-pay | Admitting: Internal Medicine

## 2012-09-07 DIAGNOSIS — M79605 Pain in left leg: Secondary | ICD-10-CM

## 2012-09-07 DIAGNOSIS — R609 Edema, unspecified: Secondary | ICD-10-CM

## 2012-09-08 ENCOUNTER — Ambulatory Visit
Admission: RE | Admit: 2012-09-08 | Discharge: 2012-09-08 | Disposition: A | Discharge status: Home / Self Care | Payer: Medicare Other | Source: Ambulatory Visit | Attending: Internal Medicine | Admitting: Internal Medicine

## 2012-09-08 ENCOUNTER — Encounter: Payer: Medicare Other | Admitting: Cardiovascular Disease

## 2012-09-08 DIAGNOSIS — M79605 Pain in left leg: Secondary | ICD-10-CM

## 2012-09-08 DIAGNOSIS — R609 Edema, unspecified: Secondary | ICD-10-CM

## 2012-09-08 NOTE — Progress Notes (Signed)
Pt cancelled appt after scheduled time. cdm

## 2012-10-03 ENCOUNTER — Encounter: Payer: Self-pay | Admitting: Vascular Surgery

## 2012-10-04 ENCOUNTER — Ambulatory Visit: Payer: Medicare Other | Admitting: Vascular Surgery

## 2012-10-16 ENCOUNTER — Encounter: Payer: Medicare Other | Admitting: Cardiovascular Disease

## 2012-11-01 ENCOUNTER — Emergency Department (HOSPITAL_COMMUNITY)
Admission: EM | Admit: 2012-11-01 | Discharge: 2012-11-01 | Disposition: A | Discharge status: Home / Self Care | Payer: Medicare Other | Attending: Emergency Medicine | Admitting: Emergency Medicine

## 2012-11-01 ENCOUNTER — Emergency Department (HOSPITAL_COMMUNITY): Payer: Medicare Other

## 2012-11-01 ENCOUNTER — Telehealth: Payer: Self-pay

## 2012-11-01 ENCOUNTER — Encounter (HOSPITAL_COMMUNITY): Payer: Self-pay | Admitting: *Deleted

## 2012-11-01 DIAGNOSIS — K219 Gastro-esophageal reflux disease without esophagitis: Secondary | ICD-10-CM | POA: Insufficient documentation

## 2012-11-01 DIAGNOSIS — Z9861 Coronary angioplasty status: Secondary | ICD-10-CM | POA: Insufficient documentation

## 2012-11-01 DIAGNOSIS — I10 Essential (primary) hypertension: Secondary | ICD-10-CM | POA: Insufficient documentation

## 2012-11-01 DIAGNOSIS — R079 Chest pain, unspecified: Secondary | ICD-10-CM | POA: Insufficient documentation

## 2012-11-01 DIAGNOSIS — Z87828 Personal history of other (healed) physical injury and trauma: Secondary | ICD-10-CM | POA: Insufficient documentation

## 2012-11-01 DIAGNOSIS — Z87891 Personal history of nicotine dependence: Secondary | ICD-10-CM | POA: Insufficient documentation

## 2012-11-01 DIAGNOSIS — J4489 Other specified chronic obstructive pulmonary disease: Secondary | ICD-10-CM | POA: Insufficient documentation

## 2012-11-01 DIAGNOSIS — Z79899 Other long term (current) drug therapy: Secondary | ICD-10-CM | POA: Insufficient documentation

## 2012-11-01 DIAGNOSIS — Z86718 Personal history of other venous thrombosis and embolism: Secondary | ICD-10-CM | POA: Insufficient documentation

## 2012-11-01 DIAGNOSIS — Z8679 Personal history of other diseases of the circulatory system: Secondary | ICD-10-CM | POA: Insufficient documentation

## 2012-11-01 DIAGNOSIS — D509 Iron deficiency anemia, unspecified: Secondary | ICD-10-CM | POA: Insufficient documentation

## 2012-11-01 DIAGNOSIS — Z7982 Long term (current) use of aspirin: Secondary | ICD-10-CM | POA: Insufficient documentation

## 2012-11-01 DIAGNOSIS — I2510 Atherosclerotic heart disease of native coronary artery without angina pectoris: Secondary | ICD-10-CM | POA: Insufficient documentation

## 2012-11-01 DIAGNOSIS — E785 Hyperlipidemia, unspecified: Secondary | ICD-10-CM | POA: Insufficient documentation

## 2012-11-01 DIAGNOSIS — J449 Chronic obstructive pulmonary disease, unspecified: Secondary | ICD-10-CM | POA: Insufficient documentation

## 2012-11-01 LAB — CBC
HCT: 36.7 % — ABNORMAL LOW (ref 39.0–52.0)
Hemoglobin: 11.9 g/dL — ABNORMAL LOW (ref 13.0–17.0)
MCH: 22.5 pg — ABNORMAL LOW (ref 26.0–34.0)
MCHC: 32.4 g/dL (ref 30.0–36.0)
MCV: 69.4 fL — ABNORMAL LOW (ref 78.0–100.0)
Platelets: 147 10*3/uL — ABNORMAL LOW (ref 150–400)
RBC: 5.29 MIL/uL (ref 4.22–5.81)
RDW: 17.1 % — ABNORMAL HIGH (ref 11.5–15.5)
WBC: 4.8 10*3/uL (ref 4.0–10.5)

## 2012-11-01 LAB — BASIC METABOLIC PANEL
BUN: 11 mg/dL (ref 6–23)
CO2: 29 mEq/L (ref 19–32)
Calcium: 9.7 mg/dL (ref 8.4–10.5)
Chloride: 102 mEq/L (ref 96–112)
Creatinine, Ser: 1.26 mg/dL (ref 0.50–1.35)
GFR calc Af Amer: 64 mL/min — ABNORMAL LOW (ref >90)
GFR calc non Af Amer: 55 mL/min — ABNORMAL LOW (ref >90)
Glucose, Bld: 89 mg/dL (ref 70–99)
Potassium: 4 mEq/L (ref 3.5–5.1)
Sodium: 140 mEq/L (ref 135–145)

## 2012-11-01 LAB — POCT I-STAT TROPONIN I: Troponin i, poc: 0 ng/mL (ref 0.00–0.08)

## 2012-11-01 LAB — TROPONIN I: Troponin I: <0.3 ng/mL (ref <0.30)

## 2012-11-01 LAB — PRO B NATRIURETIC PEPTIDE: Pro B Natriuretic peptide (BNP): 52.5 pg/mL (ref 0–125)

## 2012-11-01 MED ORDER — ASPIRIN 81 MG PO CHEW
CHEWABLE_TABLET | ORAL | Status: AC
  Administered 2012-11-01: 14:00:00
  Filled 2012-11-01: qty 1

## 2012-11-01 MED ORDER — ASPIRIN 81 MG PO CHEW
324.0000 mg | CHEWABLE_TABLET | Freq: Once | ORAL | Status: AC
  Administered 2012-11-01: 243 mg via ORAL
  Filled 2012-11-01: qty 4

## 2012-11-01 NOTE — ED Provider Notes (Signed)
History    72 year old male with chest pain. Onset Tuesday. Intermittent. Pain is substernal. Patient says the pain is actually noticed while at rest and improves with activity. It does not radiate. Feels squeezing in nature with occasional sharper pain. Duration is variable sometimes lasting minutes and up to an hour. Last episode was around 0930 today and had resolved by the time he had come to the ER. Currently no complaints.  Patient has had intermittent shortness of breath over the past week. Does not seem to necessarily coincide when he is having his chest pain. No cough. No fevers or chills. No unusual leg pain or leg swelling. Patient does have a known history of coronary artery disease. Has nitroglycerin. Tried taking it only once with one of these episodes and says it only gave him headache. Has been hesitant to try it again because of this. Cardiac catheterization in February with placement of a bare-metal stent. Previously followed by Dr. Riley Kill and it appears that his care is being transitioned to Dr. [place]. Patient reports upcoming cardiology appointment on August 7. He reports compliance with his medication.   CSN: [id number] Arrival date & time 11/01/12  1048  First MD Initiated Contact with Patient 11/01/12 1113     Chief Complaint  Patient presents with  . Chest Pain   (Consider location/radiation/quality/duration/timing/severity/associated sxs/prior Treatment) HPI Past Medical History  Diagnosis Date  . GERD (gastroesophageal reflux disease)   . HLD (hyperlipidemia)   . HTN (hypertension)   . Tobacco abuse   . Cough secondary to angiotensin converting enzyme inhibitor (ACE-I)   . Rotator cuff injury   . Angina   . Peripheral vascular disease   . CAD (coronary artery disease)     A. Negative MV 04/2010;  B. Inadequate Stress Echo 05/2011;  C. 05/29/11 Cath: - Med Rx;  D. 05/2012 Cath/PCI: LM nl, LAD 40p, LCX ectatic, OM1 184m, OM2 90p (3.5x16 Veriflex BMS), RCA dominant  40p/d, PDA 50p, EF 55-65%.  . Iron deficiency anemia   . DVT (deep venous thrombosis)   . Shortness of breath   . Microcytic anemia   . COPD (chronic obstructive pulmonary disease)    Past Surgical History  Procedure Laterality Date  . Iliac artery aneurysm repair    . Circumcision    . Pr vein bypass graft,aorto-fem-pop  10/01/2009    left below knee popliteal artery to posterior tibial artery  . Cardiac catheterization     Family History  Problem Relation Age of Onset  . Breast cancer Mother     deceased  . Cancer Mother   . Hyperlipidemia Mother   . Hypertension Mother   . COPD Father     deceased  . Cancer Father   . Hyperlipidemia Father   . Hypertension Father   . Hypertension Sister   . Cancer Brother   . Hypertension Brother   . Hypertension Daughter   . Hypertension Son    History  Substance Use Topics  . Smoking status: Former Smoker -- 1.00 packs/day for 55 years    Types: Cigarettes    Quit date: 05/26/2011  . Smokeless tobacco: Never Used  . Alcohol Use: No    Review of Systems  All systems reviewed and negative, other than as noted in HPI.   Allergies  Hydrocodone; Imdur; Lisinopril; and Topiramate  Home Medications   Current Outpatient Rx  Name  Route  Sig  Dispense  Refill  . albuterol (PROVENTIL HFA;VENTOLIN HFA) 108 (90 BASE) MCG/ACT  inhaler   Inhalation   Inhale 2 puffs into the lungs 2 (two) times daily as needed for wheezing or shortness of breath.          Marland Kitchen amLODipine (NORVASC) 5 MG tablet   Oral   Take 5 mg by mouth daily.         Marland Kitchen aspirin EC 81 MG tablet   Oral   Take 81 mg by mouth 2 (two) times daily.         Marland Kitchen atenolol (TENORMIN) 25 MG tablet   Oral   Take 25 mg by mouth daily.         . ferrous sulfate 325 (65 FE) MG tablet   Oral   Take 325 mg by mouth daily with breakfast.         . Iron-Vitamins (GERITOL) LIQD   Oral   Take 5 mLs by mouth every other day.         . losartan-hydrochlorothiazide  (HYZAAR) 100-25 MG per tablet   Oral   Take 1 tablet by mouth daily.          . nitroGLYCERIN (NITROSTAT) 0.4 MG SL tablet   Sublingual   Place 1 tablet (0.4 mg total) under the tongue every 5 (five) minutes as needed for chest pain.   25 tablet   3   . omeprazole (PRILOSEC) 20 MG capsule   Oral   Take 1 capsule (20 mg total) by mouth 2 (two) times daily.         . simvastatin (ZOCOR) 20 MG tablet   Oral   Take 20 mg by mouth daily.         . traMADol (ULTRAM) 50 MG tablet   Oral   Take 50 mg by mouth 3 (three) times daily as needed for pain. For pain. Maximum dose= 8 tablets per day          BP 133/63  Pulse 52  Temp(Src) 98.1 F (36.7 C) (Oral)  Resp 20  SpO2 100% Physical Exam  Nursing note and vitals reviewed. Constitutional: He appears well-developed and well-nourished. No distress.  HENT:  Head: Normocephalic and atraumatic.  Eyes: Conjunctivae are normal. Right eye exhibits no discharge. Left eye exhibits no discharge.  Neck: Neck supple.  Cardiovascular: Normal rate, regular rhythm and normal heart sounds.  Exam reveals no gallop and no friction rub.   No murmur heard. Pulmonary/Chest: Effort normal and breath sounds normal. No respiratory distress.  Abdominal: Soft. He exhibits no distension. There is no tenderness.  Musculoskeletal: He exhibits no edema and no tenderness.  Lower extremities symmetric as compared to each other. No calf tenderness. No appreciable edema. Negative Homan's. No palpable cords.   Neurological: He is alert.  Skin: Skin is warm and dry.  Psychiatric: He has a normal mood and affect. His behavior is normal. Thought content normal.    ED Course  Procedures (including critical care time) Labs Reviewed  CBC - Abnormal; Notable for the following:    Hemoglobin 11.9 (*)    HCT 36.7 (*)    MCV 69.4 (*)    MCH 22.5 (*)    RDW 17.1 (*)    Platelets 147 (*)    All other components within normal limits  BASIC METABOLIC PANEL -  Abnormal; Notable for the following:    GFR calc non Af Amer 55 (*)    GFR calc Af Amer 64 (*)    All other components within normal limits  PRO B  NATRIURETIC PEPTIDE  TROPONIN I  POCT I-STAT TROPONIN I   EKG:  Rhythm: sinus bradycardia w/1st degree AV block Vent. rate 47 BPM PR interval 216 ms QRS duration 98 ms QT/QTc 446/394 ms ST segments: NS ST changes Comparison: little interval change from 08/2012.    Dg Chest 2 View  11/01/2012   *RADIOLOGY REPORT*  Clinical Data: Chest pain and shortness of breath  CHEST - 2 VIEW  Comparison: 08/14/2012  Findings: The heart, mediastinum and hila are within normal limits. The lungs are clear.  The bony thorax is intact.  There has been no change.  IMPRESSION: No active disease of the chest.   Original Report Authenticated By: Amie Portland, M.D.   1. Chest pain     MDM  71yM with CP. Known CAD. Both typical and atypical characteristics. Squeezing nature somewhat concerning but duration as brief as a couple minutes and onset at rest/ improvement with exertion is atypical. EKG stable from previous. Pt with some mild bradycardia to ~50 but asymptomatic and, in review of records, appears to be baseline. Initial trop normal. CBC/BMP unrevealing. CXR clear. Lungs clear and no increased WOB. Normal BNP virtually excludes HF. Doubt PE.  My suspicion for angina is fairly low. Given hx hx though, will repeat troponin. If this normal and pt remains symptoms free I feel he is appropriate for outpt FU with cardiology.   Repeat troponin normal. Pt with no further complaints. Return precautions dicussed.   Raeford Razor, MD 11/01/12 [phone]

## 2012-11-01 NOTE — ED Notes (Signed)
PT states that since last Tuesday having chest pain, shortness of breath, headache and dizziness.

## 2012-11-01 NOTE — Telephone Encounter (Signed)
The pt walked into Samandra Tann's NP office this morning complaining of substernal chest pain.  The pt's chest pain resolved during his physical exam.  The pt is not acutely in distress and is not SOB or nauseated.  EKG was performed and per Vernard Gambles this was okay. The pt has been taking NTG for his symptoms.  I made Samandra aware that the pt does need to seek evaluation in the ER.  She will try to arrange EMS transportation to [facility] ER.  I have spoken with Lillia Abed and let he know about the pt's arrival.

## 2012-11-09 ENCOUNTER — Emergency Department (HOSPITAL_COMMUNITY): Payer: Medicare Other

## 2012-11-09 ENCOUNTER — Encounter (HOSPITAL_COMMUNITY): Payer: Self-pay | Admitting: *Deleted

## 2012-11-09 ENCOUNTER — Observation Stay (HOSPITAL_COMMUNITY)
Admission: EM | Admit: 2012-11-09 | Discharge: 2012-11-10 | Disposition: A | Discharge status: Home / Self Care | Payer: Medicare Other | Attending: Cardiology | Admitting: Cardiology

## 2012-11-09 DIAGNOSIS — I498 Other specified cardiac arrhythmias: Secondary | ICD-10-CM | POA: Insufficient documentation

## 2012-11-09 DIAGNOSIS — R001 Bradycardia, unspecified: Secondary | ICD-10-CM | POA: Diagnosis present

## 2012-11-09 DIAGNOSIS — J449 Chronic obstructive pulmonary disease, unspecified: Secondary | ICD-10-CM | POA: Insufficient documentation

## 2012-11-09 DIAGNOSIS — Z79899 Other long term (current) drug therapy: Secondary | ICD-10-CM | POA: Insufficient documentation

## 2012-11-09 DIAGNOSIS — I2510 Atherosclerotic heart disease of native coronary artery without angina pectoris: Secondary | ICD-10-CM | POA: Insufficient documentation

## 2012-11-09 DIAGNOSIS — Z9861 Coronary angioplasty status: Secondary | ICD-10-CM | POA: Insufficient documentation

## 2012-11-09 DIAGNOSIS — E876 Hypokalemia: Secondary | ICD-10-CM | POA: Insufficient documentation

## 2012-11-09 DIAGNOSIS — I10 Essential (primary) hypertension: Secondary | ICD-10-CM | POA: Insufficient documentation

## 2012-11-09 DIAGNOSIS — R0602 Shortness of breath: Secondary | ICD-10-CM | POA: Insufficient documentation

## 2012-11-09 DIAGNOSIS — R0789 Other chest pain: Secondary | ICD-10-CM | POA: Diagnosis present

## 2012-11-09 DIAGNOSIS — R079 Chest pain, unspecified: Principal | ICD-10-CM | POA: Insufficient documentation

## 2012-11-09 DIAGNOSIS — J4489 Other specified chronic obstructive pulmonary disease: Secondary | ICD-10-CM | POA: Insufficient documentation

## 2012-11-09 HISTORY — DX: Personal history of other medical treatment: Z92.89

## 2012-11-09 LAB — CBC
HCT: 34.5 % — ABNORMAL LOW (ref 39.0–52.0)
HCT: 36.7 % — ABNORMAL LOW (ref 39.0–52.0)
Hemoglobin: 11.2 g/dL — ABNORMAL LOW (ref 13.0–17.0)
Hemoglobin: 11.6 g/dL — ABNORMAL LOW (ref 13.0–17.0)
MCH: 22.2 pg — ABNORMAL LOW (ref 26.0–34.0)
MCH: 21.9 pg — ABNORMAL LOW (ref 26.0–34.0)
MCHC: 32.5 g/dL (ref 30.0–36.0)
MCHC: 31.6 g/dL (ref 30.0–36.0)
MCV: 68.3 fL — ABNORMAL LOW (ref 78.0–100.0)
MCV: 69.4 fL — ABNORMAL LOW (ref 78.0–100.0)
Platelets: 150 10*3/uL (ref 150–400)
Platelets: 143 10*3/uL — ABNORMAL LOW (ref 150–400)
RBC: 5.05 MIL/uL (ref 4.22–5.81)
RBC: 5.29 MIL/uL (ref 4.22–5.81)
RDW: 16.9 % — ABNORMAL HIGH (ref 11.5–15.5)
RDW: 17.1 % — ABNORMAL HIGH (ref 11.5–15.5)
WBC: 3.8 10*3/uL — ABNORMAL LOW (ref 4.0–10.5)
WBC: 4.3 10*3/uL (ref 4.0–10.5)

## 2012-11-09 LAB — CREATININE, SERUM
Creatinine, Ser: 1.25 mg/dL (ref 0.50–1.35)
GFR calc Af Amer: 65 mL/min — ABNORMAL LOW (ref >90)
GFR calc non Af Amer: 56 mL/min — ABNORMAL LOW (ref >90)

## 2012-11-09 LAB — BASIC METABOLIC PANEL
BUN: 13 mg/dL (ref 6–23)
CO2: 25 mEq/L (ref 19–32)
Calcium: 9.4 mg/dL (ref 8.4–10.5)
Chloride: 102 mEq/L (ref 96–112)
Creatinine, Ser: 1.17 mg/dL (ref 0.50–1.35)
GFR calc Af Amer: 70 mL/min — ABNORMAL LOW (ref >90)
GFR calc non Af Amer: 60 mL/min — ABNORMAL LOW (ref >90)
Glucose, Bld: 126 mg/dL — ABNORMAL HIGH (ref 70–99)
Potassium: 3 mEq/L — ABNORMAL LOW (ref 3.5–5.1)
Sodium: 138 mEq/L (ref 135–145)

## 2012-11-09 LAB — POCT I-STAT TROPONIN I
Troponin i, poc: 0.02 ng/mL (ref 0.00–0.08)
Troponin i, poc: 0 ng/mL (ref 0.00–0.08)

## 2012-11-09 LAB — PRO B NATRIURETIC PEPTIDE: Pro B Natriuretic peptide (BNP): 63.1 pg/mL (ref 0–125)

## 2012-11-09 LAB — TROPONIN I
Troponin I: <0.3 ng/mL (ref <0.30)
Troponin I: <0.3 ng/mL (ref <0.30)

## 2012-11-09 LAB — TSH: TSH: 1.411 u[IU]/mL (ref 0.350–4.500)

## 2012-11-09 MED ORDER — ACETAMINOPHEN 325 MG PO TABS: 650.0000 mg | ORAL_TABLET | ORAL | Status: DC | PRN

## 2012-11-09 MED ORDER — NITROGLYCERIN 0.4 MG SL SUBL
0.4000 mg | SUBLINGUAL_TABLET | SUBLINGUAL | Status: DC | PRN
  Administered 2012-11-09: 0.4 mg via SUBLINGUAL

## 2012-11-09 MED ORDER — ASPIRIN EC 81 MG PO TBEC: 81.0000 mg | DELAYED_RELEASE_TABLET | Freq: Every day | ORAL | Status: DC

## 2012-11-09 MED ORDER — LOSARTAN POTASSIUM 50 MG PO TABS
100.0000 mg | ORAL_TABLET | Freq: Every day | ORAL | Status: DC
  Administered 2012-11-10: 100 mg via ORAL
  Filled 2012-11-09 (×2): qty 2

## 2012-11-09 MED ORDER — ZOLPIDEM TARTRATE 5 MG PO TABS: 5.0000 mg | ORAL_TABLET | Freq: Every evening | ORAL | Status: DC | PRN

## 2012-11-09 MED ORDER — PANTOPRAZOLE SODIUM 40 MG PO TBEC
80.0000 mg | DELAYED_RELEASE_TABLET | Freq: Every day | ORAL | Status: DC
  Administered 2012-11-09 – 2012-11-10 (×2): 80 mg via ORAL
  Filled 2012-11-09: qty 1
  Filled 2012-11-09: qty 2

## 2012-11-09 MED ORDER — ONDANSETRON HCL 4 MG/2ML IJ SOLN: 4.0000 mg | Freq: Four times a day (QID) | INTRAMUSCULAR | Status: DC | PRN

## 2012-11-09 MED ORDER — POTASSIUM CHLORIDE CRYS ER 20 MEQ PO TBCR
40.0000 meq | EXTENDED_RELEASE_TABLET | Freq: Once | ORAL | Status: AC
  Administered 2012-11-09: 40 meq via ORAL
  Filled 2012-11-09: qty 2

## 2012-11-09 MED ORDER — SIMVASTATIN 20 MG PO TABS
20.0000 mg | ORAL_TABLET | Freq: Every evening | ORAL | Status: DC
  Administered 2012-11-09: 20 mg via ORAL
  Filled 2012-11-09 (×2): qty 1

## 2012-11-09 MED ORDER — MORPHINE SULFATE 4 MG/ML IJ SOLN
4.0000 mg | Freq: Once | INTRAMUSCULAR | Status: AC
  Administered 2012-11-09: 4 mg via INTRAVENOUS
  Filled 2012-11-09: qty 1

## 2012-11-09 MED ORDER — ASPIRIN EC 81 MG PO TBEC
81.0000 mg | DELAYED_RELEASE_TABLET | Freq: Two times a day (BID) | ORAL | Status: DC
  Administered 2012-11-09 – 2012-11-10 (×2): 81 mg via ORAL
  Filled 2012-11-09 (×4): qty 1

## 2012-11-09 MED ORDER — LOSARTAN POTASSIUM-HCTZ 100-25 MG PO TABS
1.0000 | ORAL_TABLET | Freq: Every day | ORAL | Status: DC
Start: 2012-11-09 — End: 2012-11-09

## 2012-11-09 MED ORDER — ENOXAPARIN SODIUM 40 MG/0.4ML ~~LOC~~ SOLN
40.0000 mg | SUBCUTANEOUS | Status: DC
  Filled 2012-11-09 (×2): qty 0.4

## 2012-11-09 MED ORDER — ALBUTEROL SULFATE HFA 108 (90 BASE) MCG/ACT IN AERS
2.0000 | INHALATION_SPRAY | Freq: Two times a day (BID) | RESPIRATORY_TRACT | Status: DC | PRN
Start: 2012-11-09 — End: 2012-11-10

## 2012-11-09 MED ORDER — ALPRAZOLAM 0.25 MG PO TABS: 0.2500 mg | ORAL_TABLET | Freq: Two times a day (BID) | ORAL | Status: DC | PRN

## 2012-11-09 MED ORDER — MORPHINE SULFATE 4 MG/ML IJ SOLN: 4.0000 mg | Freq: Once | INTRAMUSCULAR | Status: DC

## 2012-11-09 MED ORDER — NITROGLYCERIN 0.4 MG SL SUBL: 0.4000 mg | SUBLINGUAL_TABLET | SUBLINGUAL | Status: DC | PRN

## 2012-11-09 MED ORDER — ASPIRIN 81 MG PO CHEW
243.0000 mg | CHEWABLE_TABLET | Freq: Once | ORAL | Status: AC
  Administered 2012-11-09: 243 mg via ORAL
  Filled 2012-11-09: qty 3

## 2012-11-09 MED ORDER — HYDROCODONE-ACETAMINOPHEN 5-325 MG PO TABS: 1.0000 | ORAL_TABLET | Freq: Four times a day (QID) | ORAL | Status: DC | PRN

## 2012-11-09 MED ORDER — TRAMADOL HCL 50 MG PO TABS
50.0000 mg | ORAL_TABLET | Freq: Three times a day (TID) | ORAL | Status: DC | PRN
  Administered 2012-11-09 – 2012-11-10 (×2): 50 mg via ORAL
  Filled 2012-11-09 (×2): qty 1

## 2012-11-09 MED ORDER — HYDROCHLOROTHIAZIDE 25 MG PO TABS
25.0000 mg | ORAL_TABLET | Freq: Every day | ORAL | Status: DC
  Administered 2012-11-10: 25 mg via ORAL
  Filled 2012-11-09 (×2): qty 1

## 2012-11-09 MED ORDER — SODIUM CHLORIDE 0.9 % IV SOLN: 250.0000 mL | INTRAVENOUS | Status: DC | PRN

## 2012-11-09 MED ORDER — SODIUM CHLORIDE 0.9 % IJ SOLN: 3.0000 mL | INTRAMUSCULAR | Status: DC | PRN

## 2012-11-09 MED ORDER — SODIUM CHLORIDE 0.9 % IJ SOLN
3.0000 mL | Freq: Two times a day (BID) | INTRAMUSCULAR | Status: DC
  Administered 2012-11-09 – 2012-11-10 (×2): 3 mL via INTRAVENOUS

## 2012-11-09 NOTE — H&P (Signed)
 History and Physical   [name] MRN: [id number], DOB/AGE: 73/11/1940 73 y.o. Date of Encounter: 11/09/2012  Primary Physician: POLITE,RONALD D, MD Primary Cardiologist: Dr. Stuckey  Chief Complaint:  Chest pain  HPI: Joel [name] is a 72 y.o. male with CAD (PCI in 05/2012, BMS to OM2), PVD, COPD, HTN, HLD, GERD, iron deficiency anemia, and known h/o DVT presents to the ED w/ complaints of chest pain. He says the pain started at about 5:00 AM this morning and he was at rest when it started. He describes it as an 9/10 in severity, sharp pain located in the left chest and radiating to the mid-sternum w/ some associated pain in his neck. He took a nitroglycerin at home which provided no relief. He says the pain was worse w/ movement and deep inspiration. He denies any SOB, diaphoresis, nausea, or vomiting.  At about 5:45 AM, the patient was concerned and drove himself to the ED. When in the ED, he received another nitroglycerin and morphine, decreasing the pain to a 5/10.  The patient had a cardiac cath in 05/2012 and says that the pain previous to his procedure was a different sensation, more dull in nature located in the left chest.   He has presented to the ED a few times since his stent placement in February for similar complaints, the most recent on 11/02/12, where he was released the same day from the ED after his cardiac enzymes and ECG were found to be normal.   Patient follows w/ Dr. Stuckey. He is not on any anticoagulation other than ASA 81 mg at home.   Past Medical History  Diagnosis Date  . GERD (gastroesophageal reflux disease)   . HLD (hyperlipidemia)   . HTN (hypertension)   . Tobacco abuse   . Cough secondary to angiotensin converting enzyme inhibitor (ACE-I)   . Rotator cuff injury   . Angina   . Peripheral vascular disease   . CAD (coronary artery disease)     A. Negative MV 04/2010;  B. Inadequate Stress Echo 05/2011;  C. 05/29/11 Cath: - Med Rx;   D. 05/2012 Cath/PCI: LM nl, LAD 40p, LCX ectatic, OM1 100m, OM2 90p (3.5x16 Veriflex BMS), RCA dominant 40p/d, PDA 50p, EF 55-65%.  . Iron deficiency anemia   . DVT (deep venous thrombosis)   . Shortness of breath   . Microcytic anemia   . COPD (chronic obstructive pulmonary disease)     Surgical History:  Past Surgical History  Procedure Laterality Date  . Iliac artery aneurysm repair    . Circumcision    . Pr vein bypass graft,aorto-fem-pop  10/01/2009    left below knee popliteal artery to posterior tibial artery  . Cardiac catheterization       I have reviewed the patient's current medications. Prior to Admission medications   Medication Sig Start Date End Date Taking? Authorizing Provider  albuterol (PROVENTIL HFA;VENTOLIN HFA) 108 (90 BASE) MCG/ACT inhaler Inhale 2 puffs into the lungs 2 (two) times daily as needed for wheezing or shortness of breath.    Yes Historical Provider, MD  amLODipine (NORVASC) 5 MG tablet Take 5 mg by mouth daily.   Yes Historical Provider, MD  aspirin EC 81 MG tablet Take 81 mg by mouth 2 (two) times daily.   Yes Historical Provider, MD  atenolol (TENORMIN) 25 MG tablet Take 25 mg by mouth daily.   Yes Historical Provider, MD  HYDROcodone-acetaminophen (NORCO/VICODIN) 5-325 MG per tablet Take 1 tablet   by mouth every 6 (six) hours as needed for pain.   Yes Historical Provider, MD  losartan-hydrochlorothiazide (HYZAAR) 100-25 MG per tablet Take 1 tablet by mouth daily.  06/01/12  Yes Rhonda G Barrett, PA-C  nitroGLYCERIN (NITROSTAT) 0.4 MG SL tablet Place 1 tablet (0.4 mg total) under the tongue every 5 (five) minutes as needed for chest pain. 06/01/12  Yes Rhonda G Barrett, PA-C  omeprazole (PRILOSEC) 20 MG capsule Take 1 capsule (20 mg total) by mouth 2 (two) times daily. 06/29/12  Yes Thomas D Stuckey, MD  simvastatin (ZOCOR) 20 MG tablet Take 20 mg by mouth every evening.    Yes Historical Provider, MD  traMADol (ULTRAM) 50 MG tablet Take 50 mg by mouth 3  (three) times daily as needed for pain. For pain. Maximum dose= 8 tablets per day   Yes Historical Provider, MD   Scheduled Meds: . potassium chloride  40 mEq Oral Once   Continuous Infusions:  PRN Meds:.nitroGLYCERIN  Allergies:  Allergies  Allergen Reactions  . Hydrocodone     Shortness of breath and leg cramps  . Imdur (Isosorbide Mononitrate) Nausea Only and Other (See Comments)    Headache  . Lisinopril Other (See Comments)    Cough  . Topiramate     Shortness of breath and leg cramps    History   Social History  . Marital Status: Widowed    Spouse Name: N/A    Number of Children: N/A  . Years of Education: N/A   Occupational History  . Retired    Social History Main Topics  . Smoking status: Former Smoker -- 1.00 packs/day for 55 years    Types: Cigarettes    Quit date: 05/26/2011  . Smokeless tobacco: Never Used  . Alcohol Use: No  . Drug Use: No  . Sexually Active: Not on file   Other Topics Concern  . 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  Lives in [place] by himself.    Family History  Problem Relation Age of Onset  . Breast cancer Mother     deceased  . Cancer Mother   . Hyperlipidemia Mother   . Hypertension Mother   . COPD Father     deceased  . Cancer Father   . Hyperlipidemia Father   . Hypertension Father   . Hypertension Sister   . Cancer Brother   . Hypertension Brother   . Hypertension Daughter   . Hypertension Son    Family Status  Relation Status Death Age  . Mother Deceased 70  . Father Deceased 72    smoker  . Sister Alive     good health  . Brother Deceased 7 month  . Brother Deceased 22  . Brother Alive     good health  . Brother Alive     good health  . Brother Alive     good health  . Sister Alive     good health  . Sister Alive     good health    Review of Systems:    General: Denies fever, chills, diaphoresis, appetite change and fatigue.  HEENT: Denies  change in vision, eye pain, redness, hearing loss, congestion, sore throat, rhinorrhea, sneezing, mouth sores, trouble swallowing, neck pain, neck stiffness and tinnitus.   Respiratory: Denies SOB, DOE, cough, chest tightness, and wheezing.   Cardiovascular: Positive for chest pain. Denies, palpitations, leg swelling, PND or orthopnea. Gastrointestinal: Denies nausea, vomiting,   abdominal pain, diarrhea, constipation, blood in stool and abdominal distention.  Genitourinary: Denies dysuria, urgency, frequency, hematuria, flank pain and difficulty urinating.  Endocrine: Denies hot or cold intolerance, sweats, polyuria, polydipsia. Musculoskeletal: Positive for pain in his pelvis/buttocks w/ exertion. Has a h/o of PVD. Denies myalgias, back pain, joint swelling, arthralgias and gait problem.  Skin: Denies pallor, rash and wounds.  Neurological: Denies dizziness, seizures, syncope, weakness, lightheadedness, numbness and headaches.  Hematological: Denies adenopathy,easy bruising, personal or family bleeding history.  Psychiatric/Behavioral: Denies mood changes, confusion, nervousness, sleep disturbance and agitation.  Physical Exam: Filed Vitals:   11/09/12 0554 11/09/12 0700  BP: 148/79 118/64  Pulse:  54  Temp: 98.1 F (36.7 C)   TempSrc: Oral   Resp: 15 7  SpO2: 99% 95%   General: Vital signs reviewed.  Patient is a well-developed and well-nourished, in no acute distress and cooperative with exam. Alert and oriented x3.  Head: Normocephalic and atraumatic. Nose: No erythema or drainage noted.  Turbinates normal. Mouth: No erythema, exudates, sores, or ulcerations. Moist mucus membranes. Eyes: PERRL, EOMI, conjunctivae normal, No scleral icterus.  Neck: Supple, trachea midline, normal ROM, No JVD, masses, thyromegaly, or carotid bruit present.  Cardiovascular: Slightly bradycardic in the 40s-50's, normal rhythm. S1 normal, S2 normal, no murmurs, gallops, or rubs. No chest tenderness on  palpation. Pulmonary/Chest: normal respiratory effort, CTAB, no wheezes, rales, or rhonchi. Abdominal: Soft. Non-tender, non-distended, bowel sounds are normal, no masses, organomegaly, or guarding present.  Musculoskeletal: No joint deformities, erythema, or stiffness, ROM full and no nontender. Extremities: No swelling or edema,  pulses symmetric and intact bilaterally. No cyanosis or clubbing. Neurological: A&O x3, Strength is normal and symmetric bilaterally, cranial nerve II-XII are grossly intact, no focal motor deficit, sensory intact to light touch bilaterally.  Skin: Warm, dry and intact. No rashes or erythema. Psychiatric: Normal mood and affect. speech and behavior is normal. Cognition and memory are normal.   Labs:   Lab Results  Component Value Date   WBC 3.8* 11/09/2012   HGB 11.2* 11/09/2012   HCT 34.5* 11/09/2012   MCV 68.3* 11/09/2012   PLT 150 11/09/2012   No results found for this basename: INR,  in the last 72 hours  Recent Labs Lab 11/09/12 0615  NA 138  K 3.0*  CL 102  CO2 25  BUN 13  CREATININE 1.17  CALCIUM 9.4  GLUCOSE 126*   No results found for this basename: CKTOTAL, CKMB, TROPONINI,  in the last 72 hours  Recent Labs  11/09/12 0634  TROPIPOC 0.02   Lab Results  Component Value Date   CHOL 144 05/31/2011   HDL 35* 05/31/2011   LDLCALC 88 05/31/2011   TRIG 103 05/31/2011   Pro B Natriuretic peptide (BNP)  Date/Time Value Range Status  11/09/2012  6:15 AM 63.1  0 - 125 pg/mL Final  11/01/2012 10:54 AM 52.5  0 - 125 pg/mL Final   Lab Results  Component Value Date   DDIMER 1.74* 09/15/2011    Radiology/Studies: Dg Chest Port 1 View  11/09/2012   *RADIOLOGY REPORT*  Clinical Data: Chest pain.  PORTABLE CHEST - 1 VIEW  Comparison: 11/01/2012.  Findings: Monitoring leads are projected over the chest. Cardiopericardial silhouette within normal limits for projection. Tortuous thoracic aorta.  Apical lordotic projection.Mild left basilar atelectasis.  No  airspace disease. No pleural effusion. Stable eventration / Bosselation of the right hemidiaphragm.  IMPRESSION: No active cardiopulmonary disease.  Mild left basilar atelectasis.   Original Report   Authenticated By: Geoffrey Lamke, M.D.   Cardiac Cath: 05/30/12 Procedure: Left Heart Cath, Selective Coronary Angiography, LV angiography, PTCA and stenting of the second Obtuse marginal vessel.  Indication: 71-year-old black male with known history of coronary disease. He presents with progressive class III angina refractory to outpatient medical therapy with 2 antianginal agents.  Procedural Details: Because previous cardiac catheterization via the right radial access was very difficult we used a left radial access. The left wrist was prepped, draped, and anesthetized with 1% lidocaine. Using the modified Seldinger technique, a 5 French sheath was introduced into the right radial artery. 3 mg of verapamil was administered through the sheath, weight-based unfractionated heparin was administered intravenously. Standard Judkins catheters were used for selective coronary angiography and left ventriculography. Catheter exchanges were performed over an exchange length guidewire.   PROCEDURAL FINDINGS  Hemodynamics:  AO 141/65 with a mean of 92 mmHg  LV 142/12 mmHg  Coronary angiography:  Coronary dominance: right  Left mainstem: Normal  Left anterior descending (LAD): The LAD is mildly ectatic in the proximal vessel. There is a focal 40% lesion in the proximal LAD. The distal vessel has mild irregularities.  Left circumflex (LCx): The left circumflex gives rise to 2 marginal branches. The first marginal branch is occluded in the mid vessel. The second marginal branch has a 90% stenosis proximally. The proximal and mid left circumflex is moderately ectatic.  Right coronary artery (RCA): The right coronary is a dominant vessel. It has a shepherd's crook deformity in the proximal vessel. The proximal vessel is  diffusely diseased up to 40%. The mid vessel has severe ectasia. The distal vessel has diffuse 40% disease. The PDA is a small branch with 50% disease proximally.  Left ventriculography: Left ventricular systolic function is normal, LVEF is estimated at 55-65%, there is no significant mitral regurgitation   PCI Note: Following the diagnostic procedure, the decision was made to proceed with PCI of the second obtuse marginal vessel. The radial sheath was upsized to a 6 French. Plavix 600 mg was given orally. Weight-based bivalirudin was given for anticoagulation. Once a therapeutic ACT was achieved, a 6 French CLS 4.5 guide catheter was inserted. The lesion was very difficult to cross with a wire. There was a side branch that came off at the lesion and the lesion itself was a shelflike stenosis. We will unable to cross with a pro-water, Fielder XT, or whisper wire. A PT2 coronary guidewire was used to finally cross the lesion. The lesion was predilated with a 2.5 mm compliant balloon. The lesion was then stented with a 3.5 x 16 mm Veriflex stent. The stent was postdilated with a 3.75 mm noncompliant balloon. Following PCI, there was 0% residual stenosis and TIMI-3 flow. Final angiography confirmed an excellent result. The patient tolerated the procedure well. There were no immediate procedural complications. A TR band was used for radial hemostasis. The patient was transferred to the post catheterization recovery area for further monitoring.  PCI Data:  Vessel - Second obtuse marginal vessel/Segment - proximal  Percent Stenosis (pre) 90%  TIMI-flow 3  Stent 3.5 x 16 mm Veriflex  Percent Stenosis (post) 0%  TIMI-flow (post) 3   Final Conclusions:  1. Single vessel obstructive coronary disease. There is chronic total occlusion of the first obtuse marginal vessel which is very small. There is a 90% stenosis in the second obtuse marginal vessel which is a large branch. The patient has diffuse atherosclerosis  and coronary ectasia.  2. Normal left ventricular   function  3. Successful stenting of the second obtuse marginal vessel with a bare-metal stent.  Recommendations:  Continue dual antiplatelet therapy for at least one month.  Echo: 08/25/12 Study Conclusions: - Left ventricle: The cavity size was normal. Systolic function was normal. The estimated ejection fraction was in the range of 60% to 65%. - Aortic valve: Mild regurgitation.  ECG:  NSR @ 64 bpm. No dynamic changes since previous ECG (see below) 11/02/12: Sinus bradycardia @ 47 bpm w/ 1st degree AV block   ASSESSMENT AND PLAN:  72 y.o. male with CAD (PCI in 05/2012, BMS to OM2), PVD, COPD, HTN, HLD, GERD, iron deficiency anemia, and known h/o DVT presents to the ED w/ complaints of chest pain. CAD: At this point, poc troponin is negative w/ no dynamic changes on ECG. TIMI score is a 4 (20% risk of cardiac event). Continue to cycle enzymes. If positive, consider cardiac catheterization given his h/o CAD w/ stent in the past. Given the patient's h/o recent intermittent chest pain, consider myoview as an outpatient to rule out ischemia.   Signed, Jones, Eden, MD 11/09/2012 7:51 AM Patient seen and examined. I agree with the assessment and plan as detailed above. See also my additional thoughts below.   I have reviewed all of the information with Dr. Jones. I have also reviewed prior office notes. The patient received a bare-metal stent in February, 2014. He was in the emergency room last week with some shortness of breath. There were no specific findings. He has chronic elevation of his d-dimer. He was sent home. Today he had some chest discomfort that brought him to the hospital. The history is vague. He's feeling better now. There is no EKG change. His first troponin is normal. I carefully reviewed the options with the patient. We talked about the possibility of staying in the hospital for a relook catheterization since he had a stent  in February, 2014. However I am not convinced this is necessary. Also the patient prefers to be discharged home with outpatient followup. We have decided to follow this approach. In addition we will arrange for a nuclear stress test to be done before the patient has his outpatient followup visit. As the patient was being monitored prior to discharge he developed sinus bradycardia. The rate was 40-45. He said that he had some dizziness with this. There was no corresponding significant chest pain. His systolic blood pressure was 95. With this additional problem of bradycardia, decision is made to admit him for further observation. Beta blocker will be held. We need to be sure that bradycardia is not causing any of his chest symptoms. If his rate is stable during the night, I would still plan to proceed with the original plan to discharge him with plans for outpatient followup nuclear stress study. Also, an event recorder could be considered if this seems prudent after evaluation tomorrow.  Stormi Vandevelde, MD, FACC 11/09/2012 9:32 AM   

## 2012-11-09 NOTE — Progress Notes (Signed)
Utilization review completed.  

## 2012-11-09 NOTE — ED Provider Notes (Signed)
Medical screening examination/treatment/procedure(s) were conducted as a shared visit with non-physician practitioner(s) and myself.  I personally evaluated the patient during the encounter  Atypical CP, may be more GI related. Hx of CAD s/p PCI. Will ask cardiology to consult on patient as may be atypical presentation of CAD. May benefit from observational admission and cycling of enzymes with possible noninvasive testing  1. Chest pain     Dg Chest 2 View  11/01/2012   *RADIOLOGY REPORT*  Clinical Data: Chest pain and shortness of breath  CHEST - 2 VIEW  Comparison: 08/14/2012  Findings: The heart, mediastinum and hila are within normal limits. The lungs are clear.  The bony thorax is intact.  There has been no change.  IMPRESSION: No active disease of the chest.   Original Report Authenticated By: Amie Portland, M.D.   Dg Chest Port 1 View  11/09/2012   *RADIOLOGY REPORT*  Clinical Data: Chest pain.  PORTABLE CHEST - 1 VIEW  Comparison: 11/01/2012.  Findings: Monitoring leads are projected over the chest. Cardiopericardial silhouette within normal limits for projection. Tortuous thoracic aorta.  Apical lordotic projection.Mild left basilar atelectasis.  No airspace disease. No pleural effusion. Stable eventration / Bosselation of the right hemidiaphragm.  IMPRESSION: No active cardiopulmonary disease.  Mild left basilar atelectasis.   Original Report Authenticated By: Andreas Newport, M.D.   I personally reviewed the imaging tests through PACS system I reviewed available ER/hospitalization records through the EMR   Lyanne Co, MD 11/09/12 [id number]

## 2012-11-09 NOTE — ED Notes (Signed)
Dr. Katz at bedside.  

## 2012-11-09 NOTE — ED Provider Notes (Signed)
CSN: [id number]     Arrival date & time 11/09/12  0548 History     First MD Initiated Contact with Patient 11/09/12 0602     Chief Complaint  Patient presents with  . Chest Pain   (Consider location/radiation/quality/duration/timing/severity/associated sxs/prior Treatment) HPI Comments: Patient with known CAD presents to the ED with a chief complaint of CP that started this morning at 5:00am.  He describes the pain as sharp, and radiates from the left side of his chest to the center.  He states that it is not a tearing chest pain.  He currently rates his pain as a 8/10.  He reports associated SOB, but no diaphoresis.  He has tried taking a SL nitro and aspirin 81mg  with no relief.  Patient was see approximately 1 week ago for similar symptoms. No cough. No fevers or chills. No unusual leg pain or leg swelling. Patient does have a known history of coronary artery disease.  Cardiac catheterization in February with placement of a bare-metal stent. Previously followed by Dr. Riley Kill and it appears that his care is being transitioned to Dr. [place]. He reports compliance with his medication.    The history is provided by the patient. No language interpreter was used.    Past Medical History  Diagnosis Date  . GERD (gastroesophageal reflux disease)   . HLD (hyperlipidemia)   . HTN (hypertension)   . Tobacco abuse   . Cough secondary to angiotensin converting enzyme inhibitor (ACE-I)   . Rotator cuff injury   . Angina   . Peripheral vascular disease   . CAD (coronary artery disease)     A. Negative MV 04/2010;  B. Inadequate Stress Echo 05/2011;  C. 05/29/11 Cath: - Med Rx;  D. 05/2012 Cath/PCI: LM nl, LAD 40p, LCX ectatic, OM1 136m, OM2 90p (3.5x16 Veriflex BMS), RCA dominant 40p/d, PDA 50p, EF 55-65%.  . Iron deficiency anemia   . DVT (deep venous thrombosis)   . Shortness of breath   . Microcytic anemia   . COPD (chronic obstructive pulmonary disease)    Past Surgical History  Procedure  Laterality Date  . Iliac artery aneurysm repair    . Circumcision    . Pr vein bypass graft,aorto-fem-pop  10/01/2009    left below knee popliteal artery to posterior tibial artery  . Cardiac catheterization     Family History  Problem Relation Age of Onset  . Breast cancer Mother     deceased  . Cancer Mother   . Hyperlipidemia Mother   . Hypertension Mother   . COPD Father     deceased  . Cancer Father   . Hyperlipidemia Father   . Hypertension Father   . Hypertension Sister   . Cancer Brother   . Hypertension Brother   . Hypertension Daughter   . Hypertension Son    History  Substance Use Topics  . Smoking status: Former Smoker -- 1.00 packs/day for 55 years    Types: Cigarettes    Quit date: 05/26/2011  . Smokeless tobacco: Never Used  . Alcohol Use: No    Review of Systems  All other systems reviewed and are negative.    Allergies  Hydrocodone; Imdur; Lisinopril; and Topiramate  Home Medications   Current Outpatient Rx  Name  Route  Sig  Dispense  Refill  . albuterol (PROVENTIL HFA;VENTOLIN HFA) 108 (90 BASE) MCG/ACT inhaler   Inhalation   Inhale 2 puffs into the lungs 2 (two) times daily as needed for wheezing or  shortness of breath.          Marland Kitchen amLODipine (NORVASC) 5 MG tablet   Oral   Take 5 mg by mouth daily.         Marland Kitchen aspirin EC 81 MG tablet   Oral   Take 81 mg by mouth 2 (two) times daily.         Marland Kitchen atenolol (TENORMIN) 25 MG tablet   Oral   Take 25 mg by mouth daily.         . ferrous sulfate 325 (65 FE) MG tablet   Oral   Take 325 mg by mouth daily with breakfast.         . HYDROcodone-acetaminophen (NORCO/VICODIN) 5-325 MG per tablet   Oral   Take 1 tablet by mouth every 6 (six) hours as needed for pain.         Marland Kitchen losartan-hydrochlorothiazide (HYZAAR) 100-25 MG per tablet   Oral   Take 1 tablet by mouth daily.          . nitroGLYCERIN (NITROSTAT) 0.4 MG SL tablet   Sublingual   Place 1 tablet (0.4 mg total) under the  tongue every 5 (five) minutes as needed for chest pain.   25 tablet   3   . omeprazole (PRILOSEC) 20 MG capsule   Oral   Take 1 capsule (20 mg total) by mouth 2 (two) times daily.         . simvastatin (ZOCOR) 20 MG tablet   Oral   Take 20 mg by mouth every evening.          . traMADol (ULTRAM) 50 MG tablet   Oral   Take 50 mg by mouth 3 (three) times daily as needed for pain. For pain. Maximum dose= 8 tablets per day          BP 148/79  Temp(Src) 98.1 F (36.7 C) (Oral)  Resp 15  SpO2 99% Physical Exam  Nursing note and vitals reviewed. Constitutional: He is oriented to person, place, and time. He appears well-developed and well-nourished.  HENT:  Head: Normocephalic and atraumatic.  Eyes: Conjunctivae and EOM are normal. Pupils are equal, round, and reactive to light. Right eye exhibits no discharge. Left eye exhibits no discharge. No scleral icterus.  Neck: Normal range of motion. Neck supple. No JVD present.  Cardiovascular: Normal rate, regular rhythm, normal heart sounds and intact distal pulses.  Exam reveals no gallop and no friction rub.   No murmur heard. Pulmonary/Chest: Effort normal and breath sounds normal. No respiratory distress. He has no wheezes. He has no rales. He exhibits no tenderness.  Abdominal: Soft. Bowel sounds are normal. He exhibits no distension and no mass. There is no tenderness. There is no rebound and no guarding.  Musculoskeletal: Normal range of motion. He exhibits no edema and no tenderness.  Neurological: He is alert and oriented to person, place, and time.  CN 3-12 intact  Skin: Skin is warm and dry.  Psychiatric: He has a normal mood and affect. His behavior is normal. Judgment and thought content normal.    ED Course   Procedures (including critical care time)  Labs Reviewed  CBC  BASIC METABOLIC PANEL  PRO B NATRIURETIC PEPTIDE  ED ECG REPORT  I personally interpreted this EKG   Date: 11/09/2012   Rate: 64  Rhythm:  normal sinus rhythm  QRS Axis: normal  Intervals: normal  ST/T Wave abnormalities: normal  Conduction Disutrbances:none  Narrative Interpretation:   Old  EKG Reviewed: none available   Results for orders placed during the hospital encounter of 11/09/12  CBC      Result Value Range   WBC 3.8 (*) 4.0 - 10.5 K/uL   RBC 5.05  4.22 - 5.81 MIL/uL   Hemoglobin 11.2 (*) 13.0 - 17.0 g/dL   HCT 82.9 (*) 56.2 - 13.0 %   MCV 68.3 (*) 78.0 - 100.0 fL   MCH 22.2 (*) 26.0 - 34.0 pg   MCHC 32.5  30.0 - 36.0 g/dL   RDW 86.5 (*) 78.4 - 69.6 %   Platelets 150  150 - 400 K/uL  BASIC METABOLIC PANEL      Result Value Range   Sodium 138  135 - 145 mEq/L   Potassium 3.0 (*) 3.5 - 5.1 mEq/L   Chloride 102  96 - 112 mEq/L   CO2 25  19 - 32 mEq/L   Glucose, Bld 126 (*) 70 - 99 mg/dL   BUN 13  6 - 23 mg/dL   Creatinine, Ser 2.95  0.50 - 1.35 mg/dL   Calcium 9.4  8.4 - 28.4 mg/dL   GFR calc non Af Amer 60 (*) >90 mL/min   GFR calc Af Amer 70 (*) >90 mL/min  PRO B NATRIURETIC PEPTIDE      Result Value Range   Pro B Natriuretic peptide (BNP) 63.1  0 - 125 pg/mL  POCT I-STAT TROPONIN I      Result Value Range   Troponin i, poc 0.02  0.00 - 0.08 ng/mL   Comment 3            Dg Chest 2 View  11/01/2012   *RADIOLOGY REPORT*  Clinical Data: Chest pain and shortness of breath  CHEST - 2 VIEW  Comparison: 08/14/2012  Findings: The heart, mediastinum and hila are within normal limits. The lungs are clear.  The bony thorax is intact.  There has been no change.  IMPRESSION: No active disease of the chest.   Original Report Authenticated By: Amie Portland, M.D.   Dg Chest Port 1 View  11/09/2012   *RADIOLOGY REPORT*  Clinical Data: Chest pain.  PORTABLE CHEST - 1 VIEW  Comparison: 11/01/2012.  Findings: Monitoring leads are projected over the chest. Cardiopericardial silhouette within normal limits for projection. Tortuous thoracic aorta.  Apical lordotic projection.Mild left basilar atelectasis.  No airspace  disease. No pleural effusion. Stable eventration / Bosselation of the right hemidiaphragm.  IMPRESSION: No active cardiopulmonary disease.  Mild left basilar atelectasis.   Original Report Authenticated By: Andreas Newport, M.D.     1. Chest pain     MDM  Patient with known CAD, experiencing CP since 5:00 this morning.  He is followed by Dr. Noni Saupe.  In reviewing recent note from cardiology, they feel that the most of the patient's symptoms are GERD related.  However, as the patient has known CAD, and recent stent, will do cardiac workup.  7:36 AM Patient discussed with [place], who will see the patient.   Roxy Horseman, PA-C 11/09/12 [phone]

## 2012-11-09 NOTE — Consult Note (Addendum)
History and Physical   [name] MRN: [id number], DOB/AGE: [birth year] 72 y.o. Date of Encounter: 11/09/2012  Primary Physician: Katy Apo, MD Primary Cardiologist: Dr. Riley Kill  Chief Complaint:  Chest pain  HPI: Joel [name] is a 72 y.o. male with CAD (PCI in 05/2012, BMS to OM2), PVD, COPD, HTN, HLD, GERD, iron deficiency anemia, and known h/o DVT presents to the ED w/ complaints of chest pain. He says the pain started at about 5:00 AM this morning and he was at rest when it started. He describes it as an 9/10 in severity, sharp pain located in the left chest and radiating to the mid-sternum w/ some associated pain in his neck. He took a nitroglycerin at home which provided no relief. He says the pain was worse w/ movement and deep inspiration. He denies any SOB, diaphoresis, nausea, or vomiting.  At about 5:45 AM, the patient was concerned and drove himself to the ED. When in the ED, he received another nitroglycerin and morphine, decreasing the pain to a 5/10.  The patient had a cardiac cath in 05/2012 and says that the pain previous to his procedure was a different sensation, more dull in nature located in the left chest.   He has presented to the ED a few times since his stent placement in February for similar complaints, the most recent on 11/02/12, where he was released the same day from the ED after his cardiac enzymes and ECG were found to be normal.   Patient follows w/ Dr. Riley Kill. He is not on any anticoagulation other than ASA 81 mg at home.   Past Medical History  Diagnosis Date  . GERD (gastroesophageal reflux disease)   . HLD (hyperlipidemia)   . HTN (hypertension)   . Tobacco abuse   . Cough secondary to angiotensin converting enzyme inhibitor (ACE-I)   . Rotator cuff injury   . Angina   . Peripheral vascular disease   . CAD (coronary artery disease)     A. Negative MV 04/2010;  B. Inadequate Stress Echo 05/2011;  C. 05/29/11 Cath: - Med Rx;   D. 05/2012 Cath/PCI: LM nl, LAD 40p, LCX ectatic, OM1 169m, OM2 90p (3.5x16 Veriflex BMS), RCA dominant 40p/d, PDA 50p, EF 55-65%.  . Iron deficiency anemia   . DVT (deep venous thrombosis)   . Shortness of breath   . Microcytic anemia   . COPD (chronic obstructive pulmonary disease)     Surgical History:  Past Surgical History  Procedure Laterality Date  . Iliac artery aneurysm repair    . Circumcision    . Pr vein bypass graft,aorto-fem-pop  10/01/2009    left below knee popliteal artery to posterior tibial artery  . Cardiac catheterization       I have reviewed the patient's current medications. Prior to Admission medications   Medication Sig Start Date End Date Taking? Authorizing Provider  albuterol (PROVENTIL HFA;VENTOLIN HFA) 108 (90 BASE) MCG/ACT inhaler Inhale 2 puffs into the lungs 2 (two) times daily as needed for wheezing or shortness of breath.    Yes Historical Provider, MD  amLODipine (NORVASC) 5 MG tablet Take 5 mg by mouth daily.   Yes Historical Provider, MD  aspirin EC 81 MG tablet Take 81 mg by mouth 2 (two) times daily.   Yes Historical Provider, MD  atenolol (TENORMIN) 25 MG tablet Take 25 mg by mouth daily.   Yes Historical Provider, MD  HYDROcodone-acetaminophen (NORCO/VICODIN) 5-325 MG per tablet Take 1 tablet  by mouth every 6 (six) hours as needed for pain.   Yes Historical Provider, MD  losartan-hydrochlorothiazide (HYZAAR) 100-25 MG per tablet Take 1 tablet by mouth daily.  06/01/12  Yes Rhonda G Barrett, PA-C  nitroGLYCERIN (NITROSTAT) 0.4 MG SL tablet Place 1 tablet (0.4 mg total) under the tongue every 5 (five) minutes as needed for chest pain. 06/01/12  Yes Rhonda G Barrett, PA-C  omeprazole (PRILOSEC) 20 MG capsule Take 1 capsule (20 mg total) by mouth 2 (two) times daily. 06/29/12  Yes Herby Abraham, MD  simvastatin (ZOCOR) 20 MG tablet Take 20 mg by mouth every evening.    Yes Historical Provider, MD  traMADol (ULTRAM) 50 MG tablet Take 50 mg by mouth 3  (three) times daily as needed for pain. For pain. Maximum dose= 8 tablets per day   Yes Historical Provider, MD   Scheduled Meds: . potassium chloride  40 mEq Oral Once   Continuous Infusions:  PRN Meds:.nitroGLYCERIN  Allergies:  Allergies  Allergen Reactions  . Hydrocodone     Shortness of breath and leg cramps  . Imdur (Isosorbide Mononitrate) Nausea Only and Other (See Comments)    Headache  . Lisinopril Other (See Comments)    Cough  . Topiramate     Shortness of breath and leg cramps    History   Social History  . Marital Status: Widowed    Spouse Name: N/A    Number of Children: N/A  . Years of Education: N/A   Occupational History  . Retired    Social History Main Topics  . Smoking status: Former Smoker -- 1.00 packs/day for 55 years    Types: Cigarettes    Quit date: 05/26/2011  . Smokeless tobacco: Never Used  . Alcohol Use: No  . Drug Use: No  . Sexually Active: Not on file   Other Topics Concern  . 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  Lives in [place] by himself.    Family History  Problem Relation Age of Onset  . Breast cancer Mother     deceased  . Cancer Mother   . Hyperlipidemia Mother   . Hypertension Mother   . COPD Father     deceased  . Cancer Father   . Hyperlipidemia Father   . Hypertension Father   . Hypertension Sister   . Cancer Brother   . Hypertension Brother   . Hypertension Daughter   . Hypertension Son    Family Status  Relation Status Death Age  . Mother Deceased 42  . Father Deceased 95    smoker  . Sister Alive     good health  . Brother Deceased 7 month  . Brother Deceased 41  . Brother Alive     good health  . Brother Alive     good health  . Brother Alive     good health  . Sister Alive     good health  . Sister Alive     good health    Review of Systems:    General: Denies fever, chills, diaphoresis, appetite change and fatigue.  HEENT: Denies  change in vision, eye pain, redness, hearing loss, congestion, sore throat, rhinorrhea, sneezing, mouth sores, trouble swallowing, neck pain, neck stiffness and tinnitus.   Respiratory: Denies SOB, DOE, cough, chest tightness, and wheezing.   Cardiovascular: Positive for chest pain. Denies, palpitations, leg swelling, PND or orthopnea. Gastrointestinal: Denies nausea, vomiting,  abdominal pain, diarrhea, constipation, blood in stool and abdominal distention.  Genitourinary: Denies dysuria, urgency, frequency, hematuria, flank pain and difficulty urinating.  Endocrine: Denies hot or cold intolerance, sweats, polyuria, polydipsia. Musculoskeletal: Positive for pain in his pelvis/buttocks w/ exertion. Has a h/o of PVD. Denies myalgias, back pain, joint swelling, arthralgias and gait problem.  Skin: Denies pallor, rash and wounds.  Neurological: Denies dizziness, seizures, syncope, weakness, lightheadedness, numbness and headaches.  Hematological: Denies adenopathy,easy bruising, personal or family bleeding history.  Psychiatric/Behavioral: Denies mood changes, confusion, nervousness, sleep disturbance and agitation.  Physical Exam: Filed Vitals:   11/09/12 0554 11/09/12 0700  BP: 148/79 118/64  Pulse:  54  Temp: 98.1 F (36.7 C)   TempSrc: Oral   Resp: 15 7  SpO2: 99% 95%   General: Vital signs reviewed.  Patient is a well-developed and well-nourished, in no acute distress and cooperative with exam. Alert and oriented x3.  Head: Normocephalic and atraumatic. Nose: No erythema or drainage noted.  Turbinates normal. Mouth: No erythema, exudates, sores, or ulcerations. Moist mucus membranes. Eyes: PERRL, EOMI, conjunctivae normal, No scleral icterus.  Neck: Supple, trachea midline, normal ROM, No JVD, masses, thyromegaly, or carotid bruit present.  Cardiovascular: Slightly bradycardic in the 40s-50's, normal rhythm. S1 normal, S2 normal, no murmurs, gallops, or rubs. No chest tenderness on  palpation. Pulmonary/Chest: normal respiratory effort, CTAB, no wheezes, rales, or rhonchi. Abdominal: Soft. Non-tender, non-distended, bowel sounds are normal, no masses, organomegaly, or guarding present.  Musculoskeletal: No joint deformities, erythema, or stiffness, ROM full and no nontender. Extremities: No swelling or edema,  pulses symmetric and intact bilaterally. No cyanosis or clubbing. Neurological: A&O x3, Strength is normal and symmetric bilaterally, cranial nerve II-XII are grossly intact, no focal motor deficit, sensory intact to light touch bilaterally.  Skin: Warm, dry and intact. No rashes or erythema. Psychiatric: Normal mood and affect. speech and behavior is normal. Cognition and memory are normal.   Labs:   Lab Results  Component Value Date   WBC 3.8* 11/09/2012   HGB 11.2* 11/09/2012   HCT 34.5* 11/09/2012   MCV 68.3* 11/09/2012   PLT 150 11/09/2012   No results found for this basename: INR,  in the last 72 hours  Recent Labs Lab 11/09/12 0615  NA 138  K 3.0*  CL 102  CO2 25  BUN 13  CREATININE 1.17  CALCIUM 9.4  GLUCOSE 126*   No results found for this basename: CKTOTAL, CKMB, TROPONINI,  in the last 72 hours  Recent Labs  11/09/12 0634  TROPIPOC 0.02   Lab Results  Component Value Date   CHOL 144 05/31/2011   HDL 35* 05/31/2011   LDLCALC 88 05/31/2011   TRIG 103 05/31/2011   Pro B Natriuretic peptide (BNP)  Date/Time Value Range Status  11/09/2012  6:15 AM 63.1  0 - 125 pg/mL Final  11/01/2012 10:54 AM 52.5  0 - 125 pg/mL Final   Lab Results  Component Value Date   DDIMER 1.74* 09/15/2011    Radiology/Studies: Dg Chest Port 1 View  11/09/2012   *RADIOLOGY REPORT*  Clinical Data: Chest pain.  PORTABLE CHEST - 1 VIEW  Comparison: 11/01/2012.  Findings: Monitoring leads are projected over the chest. Cardiopericardial silhouette within normal limits for projection. Tortuous thoracic aorta.  Apical lordotic projection.Mild left basilar atelectasis.  No  airspace disease. No pleural effusion. Stable eventration / Bosselation of the right hemidiaphragm.  IMPRESSION: No active cardiopulmonary disease.  Mild left basilar atelectasis.   Original Report  Authenticated By: Andreas Newport, M.D.   Cardiac Cath: 05/30/12 Procedure: Left Heart Cath, Selective Coronary Angiography, LV angiography, PTCA and stenting of the second Obtuse marginal vessel.  Indication: 72 year old black male with known history of coronary disease. He presents with progressive class III angina refractory to outpatient medical therapy with 2 antianginal agents.  Procedural Details: Because previous cardiac catheterization via the right radial access was very difficult we used a left radial access. The left wrist was prepped, draped, and anesthetized with 1% lidocaine. Using the modified Seldinger technique, a 5 French sheath was introduced into the right radial artery. 3 mg of verapamil was administered through the sheath, weight-based unfractionated heparin was administered intravenously. Standard Judkins catheters were used for selective coronary angiography and left ventriculography. Catheter exchanges were performed over an exchange length guidewire.   PROCEDURAL FINDINGS  Hemodynamics:  AO 141/65 with a mean of 92 mmHg  LV 142/12 mmHg  Coronary angiography:  Coronary dominance: right  Left mainstem: Normal  Left anterior descending (LAD): The LAD is mildly ectatic in the proximal vessel. There is a focal 40% lesion in the proximal LAD. The distal vessel has mild irregularities.  Left circumflex (LCx): The left circumflex gives rise to 2 marginal branches. The first marginal branch is occluded in the mid vessel. The second marginal branch has a 90% stenosis proximally. The proximal and mid left circumflex is moderately ectatic.  Right coronary artery (RCA): The right coronary is a dominant vessel. It has a shepherd's crook deformity in the proximal vessel. The proximal vessel is  diffusely diseased up to 40%. The mid vessel has severe ectasia. The distal vessel has diffuse 40% disease. The PDA is a small branch with 50% disease proximally.  Left ventriculography: Left ventricular systolic function is normal, LVEF is estimated at 55-65%, there is no significant mitral regurgitation   PCI Note: Following the diagnostic procedure, the decision was made to proceed with PCI of the second obtuse marginal vessel. The radial sheath was upsized to a 6 [place]. Plavix 600 mg was given orally. Weight-based bivalirudin was given for anticoagulation. Once a therapeutic ACT was achieved, a 6 [place] CLS 4.5 guide catheter was inserted. The lesion was very difficult to cross with a wire. There was a side branch that came off at the lesion and the lesion itself was a shelflike stenosis. We will unable to cross with a pro-water, [name], or whisper wire. A PT2 coronary guidewire was used to finally cross the lesion. The lesion was predilated with a 2.5 mm compliant balloon. The lesion was then stented with a 3.5 x 16 mm Veriflex stent. The stent was postdilated with a 3.75 mm noncompliant balloon. Following PCI, there was 0% residual stenosis and TIMI-3 flow. Final angiography confirmed an excellent result. The patient tolerated the procedure well. There were no immediate procedural complications. A TR band was used for radial hemostasis. The patient was transferred to the post catheterization recovery area for further monitoring.  PCI Data:  Vessel - Second obtuse marginal vessel/Segment - proximal  Percent Stenosis (pre) 90%  TIMI-flow 3  Stent 3.5 x 16 mm Veriflex  Percent Stenosis (post) 0%  TIMI-flow (post) 3   Final Conclusions:  1. Single vessel obstructive coronary disease. There is chronic total occlusion of the first obtuse marginal vessel which is very small. There is a 90% stenosis in the second obtuse marginal vessel which is a large branch. The patient has diffuse atherosclerosis  and coronary ectasia.  2. Normal left ventricular  function  3. Successful stenting of the second obtuse marginal vessel with a bare-metal stent.  Recommendations:  Continue dual antiplatelet therapy for at least one month.  Echo: 08/25/12 Study Conclusions: - Left ventricle: The cavity size was normal. Systolic function was normal. The estimated ejection fraction was in the range of 60% to 65%. - Aortic valve: Mild regurgitation.  ECG:  NSR @ 64 bpm. No dynamic changes since previous ECG (see below) 11/02/12: Sinus bradycardia @ 47 bpm w/ 1st degree AV block   ASSESSMENT AND PLAN:  72 y.o. male with CAD (PCI in 05/2012, BMS to OM2), PVD, COPD, HTN, HLD, GERD, iron deficiency anemia, and known h/o DVT presents to the ED w/ complaints of chest pain. CAD: At this point, poc troponin is negative w/ no dynamic changes on ECG. TIMI score is a 4 (20% risk of cardiac event). Continue to cycle enzymes. If positive, consider cardiac catheterization given his h/o CAD w/ stent in the past. Given the patient's h/o recent intermittent chest pain, consider myoview as an outpatient to rule out ischemia.   Roxan Diesel, MD 11/09/2012 7:51 AM Patient seen and examined. I agree with the assessment and plan as detailed above. See also my additional thoughts below.   I have reviewed all of the information with Dr. Yetta Barre. I have also reviewed prior office notes. The patient received a bare-metal stent in February, 2014. He was in the emergency room last week with some shortness of breath. There were no specific findings. He has chronic elevation of his d-dimer. He was sent home. Today he had some chest discomfort that brought him to the hospital. The history is vague. He's feeling better now. There is no EKG change. His first troponin is normal. I carefully reviewed the options with the patient. We talked about the possibility of staying in the hospital for a relook catheterization since he had a stent  in February, 2014. However I am not convinced this is necessary. Also the patient prefers to be discharged home with outpatient followup. We have decided to follow this approach. In addition we will arrange for a nuclear stress test to be done before the patient has his outpatient followup visit. As the patient was being monitored prior to discharge he developed sinus bradycardia. The rate was 40-45. He said that he had some dizziness with this. There was no corresponding significant chest pain. His systolic blood pressure was 95. With this additional problem of bradycardia, decision is made to admit him for further observation. Beta blocker will be held. We need to be sure that bradycardia is not causing any of his chest symptoms. If his rate is stable during the night, I would still plan to proceed with the original plan to discharge him with plans for outpatient followup nuclear stress study. Also, an event recorder could be considered if this seems prudent after evaluation tomorrow.  Willa Rough, MD, [facility] 11/09/2012 9:32 AM

## 2012-11-09 NOTE — ED Notes (Signed)
Had episode of dizziness, heart rate in 30's -- no ectopy noted-- PA notified-- repeat EKG done--

## 2012-11-09 NOTE — ED Notes (Signed)
Pt c/o left sided chest pain radiates to central chest and left neck. Pt reports onset at 0500 this morning. Associated symptoms include shortness of breath, pt describes the pain as sharp, movement increases pain, lying still decreases the pain. Pt took nitro x 1, one 81 mg asa, one 50 mg tramadol, and omeprazole as well as his BP medications. Pt reports no relief since taking his medication. Pt denies n/v, diaphoresis, back pain. Pt is A&Ox4, respirations equal and unlabored, skin warm and dry

## 2012-11-09 NOTE — ED Notes (Signed)
Lunch here from dietary.

## 2012-11-09 NOTE — ED Notes (Signed)
Sitting at side of bed -- hungry-- [place] sandwich given

## 2012-11-10 DIAGNOSIS — R0789 Other chest pain: Secondary | ICD-10-CM | POA: Diagnosis present

## 2012-11-10 DIAGNOSIS — E876 Hypokalemia: Secondary | ICD-10-CM | POA: Diagnosis present

## 2012-11-10 DIAGNOSIS — R079 Chest pain, unspecified: Secondary | ICD-10-CM

## 2012-11-10 HISTORY — PX: CARDIOVASCULAR STRESS TEST: SHX262

## 2012-11-10 LAB — LIPID PANEL
Cholesterol: 184 mg/dL (ref 0–200)
HDL: 37 mg/dL — ABNORMAL LOW (ref >39)
LDL Cholesterol: 116 mg/dL — ABNORMAL HIGH (ref 0–99)
Total CHOL/HDL Ratio: 5 RATIO
Triglycerides: 156 mg/dL — ABNORMAL HIGH (ref <150)
VLDL: 31 mg/dL (ref 0–40)

## 2012-11-10 LAB — BASIC METABOLIC PANEL
BUN: 11 mg/dL (ref 6–23)
CO2: 30 mEq/L (ref 19–32)
Calcium: 9.4 mg/dL (ref 8.4–10.5)
Chloride: 101 mEq/L (ref 96–112)
Creatinine, Ser: 1.18 mg/dL (ref 0.50–1.35)
GFR calc Af Amer: 69 mL/min — ABNORMAL LOW (ref >90)
GFR calc non Af Amer: 60 mL/min — ABNORMAL LOW (ref >90)
Glucose, Bld: 98 mg/dL (ref 70–99)
Potassium: 3.8 mEq/L (ref 3.5–5.1)
Sodium: 139 mEq/L (ref 135–145)

## 2012-11-10 LAB — CBC
HCT: 35.1 % — ABNORMAL LOW (ref 39.0–52.0)
Hemoglobin: 11.2 g/dL — ABNORMAL LOW (ref 13.0–17.0)
MCH: 22 pg — ABNORMAL LOW (ref 26.0–34.0)
MCHC: 31.9 g/dL (ref 30.0–36.0)
MCV: 69.1 fL — ABNORMAL LOW (ref 78.0–100.0)
Platelets: 152 10*3/uL (ref 150–400)
RBC: 5.08 MIL/uL (ref 4.22–5.81)
RDW: 17 % — ABNORMAL HIGH (ref 11.5–15.5)
WBC: 4.6 10*3/uL (ref 4.0–10.5)

## 2012-11-10 LAB — TROPONIN I: Troponin I: <0.3 ng/mL (ref <0.30)

## 2012-11-10 MED ORDER — SIMVASTATIN 40 MG PO TABS: 40.0000 mg | ORAL_TABLET | Freq: Every evening | ORAL | Status: DC

## 2012-11-10 MED ORDER — POTASSIUM CHLORIDE CRYS ER 10 MEQ PO TBCR
10.0000 meq | EXTENDED_RELEASE_TABLET | Freq: Every day | ORAL | Status: DC
  Administered 2012-11-10: 10 meq via ORAL
  Filled 2012-11-10: qty 1

## 2012-11-10 MED ORDER — POTASSIUM CHLORIDE CRYS ER 10 MEQ PO TBCR: 10.0000 meq | EXTENDED_RELEASE_TABLET | Freq: Every day | ORAL | Status: DC

## 2012-11-10 MED ORDER — TRAMADOL HCL 50 MG PO TABS: 50.0000 mg | ORAL_TABLET | Freq: Three times a day (TID) | ORAL | Status: DC | PRN

## 2012-11-10 MED ORDER — TRAMADOL HCL 50 MG PO TABS
50.0000 mg | ORAL_TABLET | Freq: Three times a day (TID) | ORAL | Status: DC | PRN
  Administered 2012-11-10: 50 mg via ORAL
  Filled 2012-11-10: qty 1

## 2012-11-10 NOTE — Discharge Summary (Signed)
CARDIOLOGY DISCHARGE SUMMARY   [name] MRN: [id number] DOB/AGE: 73/29/1942 72 y.o.  Admit date: 11/09/2012 Discharge date: 11/10/2012  Primary Discharge Diagnosis:    Chest pain, localized  Secondary Discharge Diagnosis:    CAD (coronary artery disease)   Bradycardia   Hypokalemia   Hospital Course: Joel [name] is a 72 y.o. male with a history of CAD. He had chest pain that started after exertion and came to the hospital where he was admitted for further evaluation and treatment.  His ECG was not acute but his heart rate was in the 40s. His atenolol was discontinued. Dr. Clifton James is to review this as an outpatient.   He was mildly anemic but this is stable. He can follow up with primary care for this. A lipid profile was checked and showed an elevated LDL. He was previously taking Zocor 20 mg a day and we will increase this to 40 mg daily. Followup liver function tests and lipid profile in 8 weeks.  On 11/10/2012, his cardiac enzymes and other labs were reviewed. His enzymes were negative for MI. His bradycardia had improved off atenolol. His potassium had improved after supplementation. Dr. Excell Seltzer evaluated Joel [name] and felt that his pain was likely musculoskeletal. Outpatient stress testing is already scheduled and he has followup arranged. Dr. Excell Seltzer felt no further inpatient workup was indicated and he is considered stable for discharge, to follow up as an outpatient.  Labs:   Lab Results  Component Value Date   WBC 4.6 11/10/2012   HGB 11.2* 11/10/2012   HCT 35.1* 11/10/2012   MCV 69.1* 11/10/2012   PLT 152 11/10/2012     Recent Labs Lab 11/10/12 0225  NA 139  K 3.8  CL 101  CO2 30  BUN 11  CREATININE 1.18  CALCIUM 9.4  GLUCOSE 98    Recent Labs  11/09/12 1416 11/09/12 1953 11/10/12 0225  TROPONINI <0.30 <0.30 <0.30   Lipid Panel     Component Value Date/Time   CHOL 184 11/10/2012 0225   TRIG 156* 11/10/2012 0225   HDL 37* 11/10/2012  0225   CHOLHDL 5.0 11/10/2012 0225   VLDL 31 11/10/2012 0225   LDLCALC 116* 11/10/2012 0225    Pro B Natriuretic peptide (BNP)  Date/Time Value Range Status  11/09/2012  6:15 AM 63.1  0 - 125 pg/mL Final  11/01/2012 10:54 AM 52.5  0 - 125 pg/mL Final     Radiology:  Dg Chest Port 1 View 11/09/2012   *RADIOLOGY REPORT*  Clinical Data: Chest pain.  PORTABLE CHEST - 1 VIEW  Comparison: 11/01/2012.  Findings: Monitoring leads are projected over the chest. Cardiopericardial silhouette within normal limits for projection. Tortuous thoracic aorta.  Apical lordotic projection.Mild left basilar atelectasis.  No airspace disease. No pleural effusion. Stable eventration / Bosselation of the right hemidiaphragm.  IMPRESSION: No active cardiopulmonary disease.  Mild left basilar atelectasis.   Original Report Authenticated By: Andreas Newport, M.D.   EKG: Sinus bradycardia, no acute ischemic changes  FOLLOW UP PLANS AND APPOINTMENTS Allergies  Allergen Reactions  . Imdur (Isosorbide Mononitrate) Nausea Only and Other (See Comments)    Headache  . Lisinopril Other (See Comments)    Cough  . Topiramate     Shortness of breath and leg cramps     Medication List    STOP taking these medications       atenolol 25 MG tablet  Commonly known as:  TENORMIN  TAKE these medications       albuterol 108 (90 BASE) MCG/ACT inhaler  Commonly known as:  PROVENTIL HFA;VENTOLIN HFA  Inhale 2 puffs into the lungs 2 (two) times daily as needed for wheezing or shortness of breath.     amLODipine 5 MG tablet  Commonly known as:  NORVASC  Take 5 mg by mouth daily.     aspirin EC 81 MG tablet  Take 81 mg by mouth 2 (two) times daily.     HYDROcodone-acetaminophen 5-325 MG per tablet  Commonly known as:  NORCO/VICODIN  Take 1 tablet by mouth every 6 (six) hours as needed for pain.     losartan-hydrochlorothiazide 100-25 MG per tablet  Commonly known as:  HYZAAR  Take 1 tablet by mouth daily.      nitroGLYCERIN 0.4 MG SL tablet  Commonly known as:  NITROSTAT  Place 1 tablet (0.4 mg total) under the tongue every 5 (five) minutes as needed for chest pain.     omeprazole 20 MG capsule  Commonly known as:  PRILOSEC  Take 1 capsule (20 mg total) by mouth 2 (two) times daily.     potassium chloride 10 MEQ tablet  Commonly known as:  K-DUR,KLOR-CON  Take 1 tablet (10 mEq total) by mouth daily.     simvastatin 40 MG tablet  Commonly known as:  ZOCOR  Take 1 tablet (40 mg total) by mouth every evening.     traMADol 50 MG tablet  Commonly known as:  ULTRAM  Take 1-2 tablets (50-100 mg total) by mouth 3 (three) times daily as needed for pain. For pain. Maximum dose= 8 tablets per day        Discharge Orders   Future Appointments Provider Department Dept Phone   11/14/2012 11:45 AM [facility] 2 [facility]) [facility] SITE 3 NUCLEAR MED [phone]   11/15/2012 1:30 PM [facility] [phone]   11/15/2012 2:00 PM [facility] [phone]   11/15/2012 3:15 PM Chuck Hint, MD [facility] [phone]   11/16/2012 11:30 AM Kathleene Hazel, MD [facility] Main Office [place]) [phone]   Future Orders Complete By Expires     Diet - low sodium heart healthy  As directed     Increase activity slowly  As directed         BRING ALL MEDICATIONS WITH YOU TO FOLLOW UP APPOINTMENTS  Time spent with patient to include physician time: 38 min Signed: Theodore Demark, PA-C 11/10/2012, 12:46 PM Co-Sign MD

## 2012-11-10 NOTE — Progress Notes (Signed)
    Subjective:  Chest pain improved, had again this am. Located at the lower left rib. He was doing some unusual lifting 2 days ago and wonders if this caused his pain. Generally gets better after Tramadol and with ambulation. It was worse yesterday and prompted his hospital admission.  Objective:  Vital Signs in the last 24 hours: Temp:  [97.5 F (36.4 C)-97.9 F (36.6 C)] 97.9 F (36.6 C) (08/01 0627) Pulse Rate:  [39-90] 51 (08/01 0627) Resp:  [11-18] 18 (07/31 1342) BP: (105-152)/(52-102) 134/67 mmHg (08/01 0627) SpO2:  [95 %-100 %] 100 % (07/31 2011) Weight:  [227 lb 9.6 oz (103.239 kg)-228 lb (103.42 kg)] 227 lb 9.6 oz (103.239 kg) (08/01 0627)  Intake/Output from previous day: 07/31 0701 - 08/01 0700 In: 243 [P.O.:240; I.V.:3] Out: -   Physical Exam: Pt is alert and oriented, NAD HEENT: normal Neck: JVP - normal, carotids 2+= without bruits Lungs: CTA bilaterally; chest wall nontender CV: RRR without significant murmur or gallop Abd: soft, NT, Positive BS, no hepatomegaly Ext: no C/C/E, distal pulses intact and equal Skin: warm/dry no rash   Lab Results:  Recent Labs  11/09/12 1416 11/10/12 0225  WBC 4.3 4.6  HGB 11.6* 11.2*  PLT 143* 152    Recent Labs  11/09/12 0615 11/09/12 1416 11/10/12 0225  NA 138  --  139  K 3.0*  --  3.8  CL 102  --  101  CO2 25  --  30  GLUCOSE 126*  --  98  BUN 13  --  11  CREATININE 1.17 1.25 1.18    Recent Labs  11/09/12 1953 11/10/12 0225  TROPONINI <0.30 <0.30   ECG: 11/10/2012 Sinus brady, possible LVH Vent. rate 54 BPM PR interval 224 ms QRS duration 96 ms QT/QTc 436/413 ms P-R-T axes 78 53 55  Tele: Sinus rhythm, sinus bradycardia in the 40s but improved this a.m.   Scheduled Meds: . aspirin EC  81 mg Oral BID  . enoxaparin (LOVENOX) injection  40 mg Subcutaneous Q24H  . losartan  100 mg Oral Daily   And  . hydrochlorothiazide  25 mg Oral Daily  . morphine  4 mg Intravenous Once  . pantoprazole   80 mg Oral Daily  . simvastatin  20 mg Oral QPM  . sodium chloride  3 mL Intravenous Q12H    Assessment/Plan:  Principal Problem:   Chest pain, localized - continue current therapy. Will increase Tramadol to 1-2 tabs TID PRN. F/U with primary MD. Had significant activity 2 days ago with no clear exertional symptoms as pain started later. MD advise if stress testing or other eval needed.   Otherwise, continue current meds except atenolol and f/u as scheduled. Active Problems:   CAD (coronary artery disease)   Bradycardia - improved off atenolol; consider adding Coreg.   Hypokalemia - resolved, we'll add daily low-dose potassium  Plan: Discharge when medically stable.  Theodore Demark, M.D. 11/10/2012, 10:11 AM    Patient seen, examined. Available data reviewed. Agree with findings, assessment, and plan as outlined by Theodore Demark, PA-C. Exam reveals alert, oriented male in NAD. Heart RRR, lungs clear, no peripheral edema. EKG, enzymes, objective data reviewed. I think he strained a muscle lifting heavy car parts. Pain is highly atypical and sounds muscular. Outpatient stress test already planned and follow-up arranged. Discharge home this am.  Tonny Bollman, M.D. 11/10/2012 11:26 AM

## 2012-11-14 ENCOUNTER — Encounter: Payer: Self-pay | Admitting: Vascular Surgery

## 2012-11-14 ENCOUNTER — Ambulatory Visit (HOSPITAL_COMMUNITY): Payer: Medicare Other | Attending: Cardiology | Admitting: Radiology

## 2012-11-14 DIAGNOSIS — R079 Chest pain, unspecified: Secondary | ICD-10-CM

## 2012-11-14 DIAGNOSIS — R0989 Other specified symptoms and signs involving the circulatory and respiratory systems: Secondary | ICD-10-CM

## 2012-11-14 MED ORDER — TECHNETIUM TC 99M SESTAMIBI GENERIC - CARDIOLITE
33.0000 | Freq: Once | INTRAVENOUS | Status: AC | PRN
  Administered 2012-11-14: 33 via INTRAVENOUS

## 2012-11-15 ENCOUNTER — Encounter (INDEPENDENT_AMBULATORY_CARE_PROVIDER_SITE_OTHER): Payer: Medicare Other | Admitting: *Deleted

## 2012-11-15 ENCOUNTER — Ambulatory Visit (HOSPITAL_COMMUNITY): Payer: Medicare Other | Attending: Cardiology | Admitting: Radiology

## 2012-11-15 ENCOUNTER — Encounter: Payer: Self-pay | Admitting: Vascular Surgery

## 2012-11-15 ENCOUNTER — Ambulatory Visit (INDEPENDENT_AMBULATORY_CARE_PROVIDER_SITE_OTHER): Payer: Medicare Other | Admitting: Vascular Surgery

## 2012-11-15 VITALS — BP 120/72 | HR 62 | Resp 16 | Ht 71.0 in | Wt 225.0 lb

## 2012-11-15 VITALS — BP 142/87 | Ht 70.0 in | Wt 222.0 lb

## 2012-11-15 DIAGNOSIS — I70219 Atherosclerosis of native arteries of extremities with intermittent claudication, unspecified extremity: Secondary | ICD-10-CM

## 2012-11-15 DIAGNOSIS — Z48812 Encounter for surgical aftercare following surgery on the circulatory system: Secondary | ICD-10-CM

## 2012-11-15 DIAGNOSIS — I739 Peripheral vascular disease, unspecified: Secondary | ICD-10-CM

## 2012-11-15 DIAGNOSIS — I10 Essential (primary) hypertension: Secondary | ICD-10-CM | POA: Insufficient documentation

## 2012-11-15 DIAGNOSIS — R0609 Other forms of dyspnea: Secondary | ICD-10-CM | POA: Insufficient documentation

## 2012-11-15 DIAGNOSIS — R079 Chest pain, unspecified: Secondary | ICD-10-CM

## 2012-11-15 DIAGNOSIS — I2510 Atherosclerotic heart disease of native coronary artery without angina pectoris: Secondary | ICD-10-CM

## 2012-11-15 DIAGNOSIS — R0602 Shortness of breath: Secondary | ICD-10-CM

## 2012-11-15 DIAGNOSIS — Z87891 Personal history of nicotine dependence: Secondary | ICD-10-CM | POA: Insufficient documentation

## 2012-11-15 DIAGNOSIS — I498 Other specified cardiac arrhythmias: Secondary | ICD-10-CM | POA: Insufficient documentation

## 2012-11-15 DIAGNOSIS — R0989 Other specified symptoms and signs involving the circulatory and respiratory systems: Secondary | ICD-10-CM | POA: Insufficient documentation

## 2012-11-15 DIAGNOSIS — Z8673 Personal history of transient ischemic attack (TIA), and cerebral infarction without residual deficits: Secondary | ICD-10-CM | POA: Insufficient documentation

## 2012-11-15 MED ORDER — TECHNETIUM TC 99M SESTAMIBI GENERIC - CARDIOLITE
30.0000 | Freq: Once | INTRAVENOUS | Status: AC | PRN
  Administered 2012-11-15: 30 via INTRAVENOUS

## 2012-11-15 MED ORDER — REGADENOSON 0.4 MG/5ML IV SOLN
0.4000 mg | Freq: Once | INTRAVENOUS | Status: AC
  Administered 2012-11-15: 0.4 mg via INTRAVENOUS

## 2012-11-15 NOTE — Progress Notes (Signed)
[facility] SITE 3 NUCLEAR MED [address] [place], [place] [address] [phone]    Cardiology Nuclear Med Study  Joel [name] is a 72 y.o. male     MRN : [id number]     DOB: December 06, 1940  Procedure Date: 11/15/2012  Nuclear Med Background Indication for Stress Test:  Evaluation for Ischemia, Stent Patency and Post Hospital 11/09/12 ER -CP/Bradycardia History:  5/14 Echo EF 60-65%, 2/14 Stent- OM2, EF 55-65%, 2013- MPS Normal EF 53% Cardiac Risk Factors: Claudication, History of Smoking, Hypertension, Lipids, PVD and TIA  Symptoms:  Chest Pain and DOE   Nuclear Pre-Procedure Caffeine/Decaff Intake:  None NPO After: 8:00pm   Lungs:  clear O2 Sat: 97% on room air. IV 0.9% NS with Angio Cath:  22g  IV Site: R Hand  IV Started by:  Bonnita Levan, RN  Chest Size (in):  46 Cup Size: n/a  Height: 5\' 10"  (1.778 m)  Weight:  222 lb (100.699 kg)  BMI:  Body mass index is 31.85 kg/(m^2). Tech Comments:  N/A    Nuclear Med Study 1 or 2 day study: 2 day  Stress Test Type:  Lexiscan  Reading MD: Marca Ancona, MD  Order Authorizing Provider:  Verne Carrow  Resting Radionuclide: Technetium 34m Sestamibi  Resting Radionuclide Dose: 33.0 mCi  11/14/12  Stress Radionuclide:  Technetium 28m Sestamibi  Stress Radionuclide Dose: 33.0 mCi    11/15/12          Stress Protocol Rest HR: 60 Stress HR: 73  Rest BP: 142/87 Stress BP: 145/83  Exercise Time (min): n/a METS: n/a   Predicted Max HR: 148 bpm % Max HR: 49.32 bpm Rate Pressure Product: 69629   Dose of Adenosine (mg):  n/a Dose of Lexiscan: 0.4 mg  Dose of Atropine (mg): n/a Dose of Dobutamine: n/a mcg/kg/min (at max HR)  Stress Test Technologist: Bonnita Levan, RN  Nuclear Technologist:  Doyne Keel, CNMT     Rest Procedure:  Myocardial perfusion imaging was performed at rest 45 minutes following the intravenous administration of Technetium 44m Sestamibi. Rest ECG: NSR - Normal EKG  Stress Procedure:  The  patient received IV Lexiscan 0.4 mg over 15-seconds.  Technetium 80m Sestamibi injected at 30-seconds.  Quantitative spect images were obtained after a 45 minute delay. Stress ECG: No significant change from baseline ECG  QPS Raw Data Images:  Normal; no motion artifact; normal heart/lung ratio. Stress Images:  Normal homogeneous uptake in all areas of the myocardium. Rest Images:  Normal homogeneous uptake in all areas of the myocardium. Subtraction (SDS):  There is no evidence of scar or ischemia. Transient Ischemic Dilatation (Normal <1.22):  n/a Lung/Heart Ratio (Normal <0.45):  0.36  Quantitative Gated Spect Images QGS EDV:  94 ml QGS ESV:  39 ml  Impression Exercise Capacity:  Lexiscan with no exercise. BP Response:  Normal blood pressure response. Clinical Symptoms:  Chest pain. ECG Impression:  No significant ST segment change suggestive of ischemia. Comparison with Prior Nuclear Study: No images to compare  Overall Impression:  Normal stress nuclear study.  LV Ejection Fraction: 58%.  LV Wall Motion:  NL LV Function; NL Wall Motion  Marca Ancona 11/15/2012

## 2012-11-15 NOTE — Progress Notes (Signed)
[facility] [place]  Patient name: Joel [name] MRN: [id number] DOB: April 14, 1940 Sex: male  REASON FOR VISIT: Routine follow up of left below-knee pop to posterior tibial artery bypass.  HPI: [name] is a 72 y.o. male who underwent a left below knee popliteal to posterior tibial artery bypass in June of 2011. He comes in for a routine follow up visit. He denies any claudication, rest pain, or nonhealing ulcers. He has had some chronic low back pain. In addition he has had some chest pain and is scheduled to see Dr. [place] later this week. He has undergone heart catheterization in February of this year.  Past Medical History  Diagnosis Date  . GERD (gastroesophageal reflux disease)   . HLD (hyperlipidemia)   . HTN (hypertension)   . Cough secondary to angiotensin converting enzyme inhibitor (ACE-I)   . Peripheral vascular disease   . CAD (coronary artery disease)     A. Negative MV 04/2010;  B. Inadequate Stress Echo 05/2011;  C. 05/29/11 Cath: - Med Rx;  D. 05/2012 Cath/PCI: LM nl, LAD 40p, LCX ectatic, OM1 161m, OM2 90p (3.5x16 Veriflex BMS), RCA dominant 40p/d, PDA 50p, EF 55-65%.  . Iron deficiency anemia   . DVT (deep venous thrombosis)   . Microcytic anemia   . COPD (chronic obstructive pulmonary disease)   . Angina   . Shortness of breath     "can come on at anytime" (11/09/2012)  . History of blood transfusion     "I've had 2; don't know what it was related to" (11/09/2012)  . Rotator cuff injury     "left arm; never repaired" (11/09/2012)    Family History  Problem Relation Age of Onset  . Breast cancer Mother     deceased  . Cancer Mother   . Hyperlipidemia Mother   . Hypertension Mother   . COPD Father     deceased  . Cancer Father   . Hyperlipidemia Father   . Hypertension Father   . Hypertension Sister   . Cancer Brother   . Hypertension Brother   . Hypertension Daughter   . Hypertension Son     SOCIAL HISTORY: History   Substance Use Topics  . Smoking status: Former Smoker -- 1.00 packs/day for 55 years    Types: Cigarettes    Quit date: 05/26/2011  . Smokeless tobacco: Never Used  . Alcohol Use: No    Allergies  Allergen Reactions  . Imdur (Isosorbide Mononitrate) Nausea Only and Other (See Comments)    Headache  . Lisinopril Other (See Comments)    Cough  . Topiramate     Shortness of breath and leg cramps    Current Outpatient Prescriptions  Medication Sig Dispense Refill  . albuterol (PROVENTIL HFA;VENTOLIN HFA) 108 (90 BASE) MCG/ACT inhaler Inhale 2 puffs into the lungs 2 (two) times daily as needed for wheezing or shortness of breath.       Marland Kitchen amLODipine (NORVASC) 5 MG tablet Take 5 mg by mouth daily.      Marland Kitchen aspirin EC 81 MG tablet Take 81 mg by mouth 2 (two) times daily.      Marland Kitchen HYDROcodone-acetaminophen (NORCO/VICODIN) 5-325 MG per tablet Take 1 tablet by mouth every 6 (six) hours as needed for pain.      Marland Kitchen losartan-hydrochlorothiazide (HYZAAR) 100-25 MG per tablet Take 1 tablet by mouth daily.       . nitroGLYCERIN (NITROSTAT) 0.4 MG SL tablet Place 1 tablet (0.4 mg  total) under the tongue every 5 (five) minutes as needed for chest pain.  25 tablet  3  . omeprazole (PRILOSEC) 20 MG capsule Take 1 capsule (20 mg total) by mouth 2 (two) times daily.      . potassium chloride (K-DUR,KLOR-CON) 10 MEQ tablet Take 1 tablet (10 mEq total) by mouth daily.  30 tablet  2  . simvastatin (ZOCOR) 40 MG tablet Take 1 tablet (40 mg total) by mouth every evening.  30 tablet  11  . traMADol (ULTRAM) 50 MG tablet Take 1-2 tablets (50-100 mg total) by mouth 3 (three) times daily as needed for pain. For pain. Maximum dose= 8 tablets per day  30 tablet  0   No current facility-administered medications for this visit.    REVIEW OF SYSTEMS: Arly.Keller ] denotes positive finding; [  ] denotes negative finding  CARDIOVASCULAR:  Arly.Keller ] chest pain   [ ]  chest pressure   [ ]  palpitations   [ ]  orthopnea   [ ]  dyspnea on  exertion   Arly.Keller ] claudication   [ ]  rest pain   [ ]  DVT   [ ]  phlebitis PULMONARY:   [ ]  productive cough   [ ]  asthma   [ ]  wheezing NEUROLOGIC:   [ ]  weakness  [ ]  paresthesias  [ ]  aphasia  [ ]  amaurosis  [ ]  dizziness HEMATOLOGIC:   [ ]  bleeding problems   [ ]  clotting disorders MUSCULOSKELETAL:  [ ]  joint pain   [ ]  joint swelling [ ]  leg swelling GASTROINTESTINAL: [ ]   blood in stool  [ ]   hematemesis GENITOURINARY:  [ ]   dysuria  [ ]   hematuria PSYCHIATRIC:  [ ]  history of major depression INTEGUMENTARY:  [ ]  rashes  [ ]  ulcers CONSTITUTIONAL:  [ ]  fever   [ ]  chills  PHYSICAL EXAM: Filed Vitals:   11/15/12 1549  BP: 120/72  Pulse: 62  Resp: 16  Height: 5\' 11"  (1.803 m)  Weight: 225 lb (102.059 kg)  SpO2: 98%   Body mass index is 31.39 kg/(m^2). GENERAL: The patient is a well-nourished male, in no acute distress. The vital signs are documented above. CARDIOVASCULAR: There is a regular rate and rhythm. I do not detect carotid bruits. He has palpable femoral pulses are palpable left popliteal and left posterior tibial pulse. Cannot palpate pulses in the right foot. Both feet are warm and well-perfused. There is no lower extremity swelling. PULMONARY: There is good air exchange bilaterally without wheezing or rales. ABDOMEN: Soft and non-tender with normal pitched bowel sounds.  MUSCULOSKELETAL: There are no major deformities or cyanosis. NEUROLOGIC: No focal weakness or paresthesias are detected. SKIN: There are no ulcers or rashes noted. PSYCHIATRIC: The patient has a normal affect.  DATA:  I have independently interpreted his arterial Doppler study which shows monophasic Doppler signals in the right foot with an ABI of 96%. On the left side he has a triphasic posterior tibial signal and dorsalis pedis signal with an ABI of 100%. The ABIs in the right may be falsely elevated because of calcific disease.  I've also interpreted his duplex of his graft which shows that his  popliteal to posterior tibial artery bypass graft is patent with no areas of stenosis within the graft.  MEDICAL ISSUES: The patient's left lower extremity bypass graft is functioning well without evidence of stenosis. We'll keep him on a yearly follow up visit scheduled and we'll see him back at that time. He is  not a smoker. He is on aspirin and is on a statin. I've encouraged him to stay as active as possible.  Artemis Koller S [facility] [place]: [phone]

## 2012-11-16 ENCOUNTER — Encounter: Payer: Self-pay | Admitting: Cardiovascular Disease

## 2012-11-16 ENCOUNTER — Ambulatory Visit (INDEPENDENT_AMBULATORY_CARE_PROVIDER_SITE_OTHER): Payer: Medicare Other | Admitting: Cardiovascular Disease

## 2012-11-16 VITALS — BP 162/80 | HR 66 | Ht 71.0 in | Wt 230.0 lb

## 2012-11-16 DIAGNOSIS — I2510 Atherosclerotic heart disease of native coronary artery without angina pectoris: Secondary | ICD-10-CM

## 2012-11-16 MED ORDER — PRAVASTATIN SODIUM 40 MG PO TABS: 40.0000 mg | ORAL_TABLET | Freq: Every evening | ORAL | Status: DC

## 2012-11-16 NOTE — Addendum Note (Signed)
Addended by: Sharee Pimple on: 11/16/2012 08:10 AM   Modules accepted: Orders

## 2012-11-16 NOTE — Progress Notes (Signed)
History of Present Illness: 72 yo male with history of HTN, HLD, CAD, PAD who is here today for cardiac follow up. He has been followed in the past by Dr. Riley Kill. Last cath February 2014 with moderate LAD and RCA disease, severe OM2 stenosis with severe stenosis treated with bare metal stent. Admitted to [facility] 11/09/12 with chest pain. Ruled out for MI. Lexiscan stress myoview yesterday without ischemia. Normal LVEF.   He is here today for follow up. Chest pain is much better. Occasional chest pain when moving in the am. No SOB. BP at home has been 130s.   Primary Care Physician:  Renford Dills  Last Lipid Profile:Lipid Panel     Component Value Date/Time   CHOL 184 11/10/2012 0225   TRIG 156* 11/10/2012 0225   HDL 37* 11/10/2012 0225   CHOLHDL 5.0 11/10/2012 0225   VLDL 31 11/10/2012 0225   LDLCALC 116* 11/10/2012 0225     Past Medical History  Diagnosis Date  . GERD (gastroesophageal reflux disease)   . HLD (hyperlipidemia)   . HTN (hypertension)   . Cough secondary to angiotensin converting enzyme inhibitor (ACE-I)   . Peripheral vascular disease   . CAD (coronary artery disease)     A. Negative MV 04/2010;  B. Inadequate Stress Echo 05/2011;  C. 05/29/11 Cath: - Med Rx;  D. 05/2012 Cath/PCI: LM nl, LAD 40p, LCX ectatic, OM1 147m, OM2 90p (3.5x16 Veriflex BMS), RCA dominant 40p/d, PDA 50p, EF 55-65%.  . Iron deficiency anemia   . DVT (deep venous thrombosis)   . Microcytic anemia   . COPD (chronic obstructive pulmonary disease)   . Angina   . Shortness of breath     "can come on at anytime" (11/09/2012)  . History of blood transfusion     "I've had 2; don't know what it was related to" (11/09/2012)  . Rotator cuff injury     "left arm; never repaired" (11/09/2012)    Past Surgical History  Procedure Laterality Date  . Iliac artery aneurysm repair    . Circumcision    . Pr vein bypass graft,aorto-fem-pop Left 10/01/2009    left below knee popliteal artery to posterior tibial artery  .  Cataract extraction w/ intraocular lens implant Right ~ 2008  . Coronary angioplasty with stent placement  2014    "1" (11/09/2012)  . Left heart cath  05-31-12  . Cardiovascular stress test  Aug. 2014    Current Outpatient Prescriptions  Medication Sig Dispense Refill  . albuterol (PROVENTIL HFA;VENTOLIN HFA) 108 (90 BASE) MCG/ACT inhaler Inhale 2 puffs into the lungs 2 (two) times daily as needed for wheezing or shortness of breath.       Marland Kitchen amLODipine (NORVASC) 5 MG tablet Take 5 mg by mouth daily.      Marland Kitchen aspirin EC 81 MG tablet Take 81 mg by mouth 2 (two) times daily.      Marland Kitchen HYDROcodone-acetaminophen (NORCO/VICODIN) 5-325 MG per tablet Take 1 tablet by mouth every 6 (six) hours as needed for pain.      Marland Kitchen losartan-hydrochlorothiazide (HYZAAR) 100-25 MG per tablet Take 1 tablet by mouth daily.       . nitroGLYCERIN (NITROSTAT) 0.4 MG SL tablet Place 1 tablet (0.4 mg total) under the tongue every 5 (five) minutes as needed for chest pain.  25 tablet  3  . omeprazole (PRILOSEC) 20 MG capsule Take 1 capsule (20 mg total) by mouth 2 (two) times daily.      Marland Kitchen  potassium chloride (K-DUR,KLOR-CON) 10 MEQ tablet Take 1 tablet (10 mEq total) by mouth daily.  30 tablet  2  . simvastatin (ZOCOR) 40 MG tablet Take 1 tablet (40 mg total) by mouth every evening.  30 tablet  11  . traMADol (ULTRAM) 50 MG tablet Take 1-2 tablets (50-100 mg total) by mouth 3 (three) times daily as needed for pain. For pain. Maximum dose= 8 tablets per day  30 tablet  0   No current facility-administered medications for this visit.    Allergies  Allergen Reactions  . Imdur (Isosorbide Mononitrate) Nausea Only and Other (See Comments)    Headache  . Lisinopril Other (See Comments)    Cough  . Topiramate     Shortness of breath and leg cramps    History   Social History  . Marital Status: Widowed    Spouse Name: N/A    Number of Children: N/A  . Years of Education: N/A   Occupational History  . Retired     Social History Main Topics  . Smoking status: Former Smoker -- 1.00 packs/day for 55 years    Types: Cigarettes    Quit date: 05/26/2011  . Smokeless tobacco: Never Used  . Alcohol Use: No  . Drug Use: No  . Sexually Active: Not on file     Comment: 11/09/2012 "I don't have to answer questions about sex"   Other Topics Concern  . 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  Lives in [place] by himself.    Family History  Problem Relation Age of Onset  . Breast cancer Mother     deceased  . Cancer Mother   . Hyperlipidemia Mother   . Hypertension Mother   . COPD Father     deceased  . Cancer Father   . Hyperlipidemia Father   . Hypertension Father   . Hypertension Sister   . Cancer Brother   . Hypertension Brother   . Hypertension Daughter   . Hypertension Son     Review of Systems:  As stated in the HPI and otherwise negative.   BP 162/80  Pulse 66  Ht 5\' 11"  (1.803 m)  Wt 230 lb (104.327 kg)  BMI 32.09 kg/m2  SpO2 97%  Physical Examination: General: Well developed, well nourished, NAD HEENT: OP clear, mucus membranes moist SKIN: warm, dry. No rashes. Neuro: No focal deficits Musculoskeletal: Muscle strength 5/5 all ext Psychiatric: Mood and affect normal Neck: No JVD, no carotid bruits, no thyromegaly, no lymphadenopathy. Lungs:Clear bilaterally, no wheezes, rhonci, crackles Cardiovascular: Regular rate and rhythm. No murmurs, gallops or rubs. Abdomen:Soft. Bowel sounds present. Non-tender.  Extremities: No lower extremity edema. Pulses are 2 + in the bilateral DP/PT.  Cardiac cath February 2014: Left mainstem: Normal  Left anterior descending (LAD): The LAD is mildly ectatic in the proximal vessel. There is a focal 40% lesion in the proximal LAD. The distal vessel has mild irregularities.  Left circumflex (LCx): The left circumflex gives rise to 2 marginal branches. The first marginal branch is occluded in the  mid vessel. The second marginal branch has a 90% stenosis proximally. The proximal and mid left circumflex is moderately ectatic.  Right coronary artery (RCA): The right coronary is a dominant vessel. It has a shepherd's crook deformity in the proximal vessel. The proximal vessel is diffusely diseased up to 40%. The mid vessel has severe ectasia. The distal vessel has diffuse 40% disease. The  PDA is a small branch with 50% disease proximally.  Left ventriculography: Left ventricular systolic function is normal, LVEF is estimated at 55-65%, there is no significant mitral regurgitation  PCI Note: Following the diagnostic procedure, the decision was made to proceed with PCI of the second obtuse marginal vessel. The radial sheath was upsized to a 6 [place]. Plavix 600 mg was given orally. Weight-based bivalirudin was given for anticoagulation. Once a therapeutic ACT was achieved, a 6 [place] CLS 4.5 guide catheter was inserted. The lesion was very difficult to cross with a wire. There was a side branch that came off at the lesion and the lesion itself was a shelflike stenosis. We will unable to cross with a pro-water, [name], or whisper wire. A PT2 coronary guidewire was used to finally cross the lesion. The lesion was predilated with a 2.5 mm compliant balloon. The lesion was then stented with a 3.5 x 16 mm Veriflex stent. The stent was postdilated with a 3.75 mm noncompliant balloon. Following PCI, there was 0% residual stenosis and TIMI-3 flow. Final angiography confirmed an excellent result. The patient tolerated the procedure well. There were no immediate procedural complications. A TR band was used for radial hemostasis. The patient was transferred to the post catheterization recovery area for further monitoring.   Assessment and Plan:   1. CAD: Stable. No ischemia on stress test. Continue current meds. Will not restart atenolol since he had bradycardia which has resolved off of this med.   2. HTN: BP  slightly elevated today. He will follow at home and call if elevated.   3. Hyperlipidemia: Will stop Zocor with potential interaction with Norvasc. Will add Pravastatin 40 mg po QHS. Lipids and LFTs 12 weeks.

## 2012-11-16 NOTE — Addendum Note (Signed)
Addended by: Domenic Polite on: 11/16/2012 09:07 AM   Modules accepted: Orders

## 2012-11-16 NOTE — Patient Instructions (Signed)
Your physician wants you to follow-up in: 6 months. You will receive a reminder letter in the mail two months in advance. If you don't receive a letter, please call our office to schedule the follow-up appointment.   Your physician recommends that you return for fasting lab work in: about 12 weeks--Last week of October--Lipid and Liver profiles.  The lab opens at 7:30 AM every week day  Your physician has recommended you make the following change in your medication:  Stop Simvastatin. Start Pravastatin 40 mg by mouth daily

## 2012-11-27 ENCOUNTER — Telehealth: Payer: Self-pay | Admitting: Cardiovascular Disease

## 2012-11-27 NOTE — Telephone Encounter (Signed)
Spoke with pt and told him lab work was to be checked in October due to recent change in cholesterol medication. He confirms he has stopped Simvastatin and is taking Pravastatin. He also reports feeling very tired for last week. No chest pain or shortness of breath. Has rotator cuff problems that is being evaluated.  I have asked him to contact primary MD to evaluated tiredness.

## 2012-11-27 NOTE — Telephone Encounter (Signed)
Pt calling re lab work, didn't realize it wasn't until October, does it not need to be sooner?

## 2012-11-27 NOTE — Telephone Encounter (Signed)
AGree. cdm 

## 2012-12-08 ENCOUNTER — Emergency Department (HOSPITAL_COMMUNITY)
Admission: EM | Admit: 2012-12-08 | Discharge: 2012-12-08 | Disposition: A | Discharge status: Home / Self Care | Payer: Medicare Other | Source: Home / Self Care

## 2012-12-08 ENCOUNTER — Encounter (HOSPITAL_COMMUNITY): Payer: Self-pay | Admitting: Emergency Medicine

## 2012-12-08 DIAGNOSIS — F411 Generalized anxiety disorder: Secondary | ICD-10-CM

## 2012-12-08 DIAGNOSIS — F419 Anxiety disorder, unspecified: Secondary | ICD-10-CM

## 2012-12-08 LAB — CBC
HCT: 38.5 % — ABNORMAL LOW (ref 39.0–52.0)
Hemoglobin: 12 g/dL — ABNORMAL LOW (ref 13.0–17.0)
MCH: 21.9 pg — ABNORMAL LOW (ref 26.0–34.0)
MCHC: 31.2 g/dL (ref 30.0–36.0)
MCV: 70.4 fL — ABNORMAL LOW (ref 78.0–100.0)
Platelets: 161 10*3/uL (ref 150–400)
RBC: 5.47 MIL/uL (ref 4.22–5.81)
RDW: 18.4 % — ABNORMAL HIGH (ref 11.5–15.5)
WBC: 5.6 10*3/uL (ref 4.0–10.5)

## 2012-12-08 LAB — POCT I-STAT, CHEM 8
BUN: 11 mg/dL (ref 6–23)
Calcium, Ion: 1.25 mmol/L (ref 1.13–1.30)
Chloride: 103 mEq/L (ref 96–112)
Creatinine, Ser: 1.1 mg/dL (ref 0.50–1.35)
Glucose, Bld: 101 mg/dL — ABNORMAL HIGH (ref 70–99)
HCT: 41 % (ref 39.0–52.0)
Hemoglobin: 13.9 g/dL (ref 13.0–17.0)
Potassium: 3.9 mEq/L (ref 3.5–5.1)
Sodium: 141 mEq/L (ref 135–145)
TCO2: 28 mmol/L (ref 0–100)

## 2012-12-08 NOTE — ED Notes (Signed)
Patient reports feeling much better.

## 2012-12-08 NOTE — ED Notes (Signed)
Reports tired, dizzy, uneasy all day.  Cannot pin-point a specific issue, just not feeling right in general, denies chest pain

## 2012-12-08 NOTE — ED Provider Notes (Signed)
Joel [name] is a 72 y.o. male who presents to Urgent Care today for anxiety. Patient noted a sensation of fatigue and "not feeling right" starting this afternoon. He denies any chest pains palpitations or trouble breathing. He denies any nausea vomiting diarrhea fevers or chills. He cannot specify further his complaints. He notes that since he's been in the clinic he feels a bit better. He trouble resting however felt significantly fatigued. He feels well otherwise.    PMH reviewed. Coronary artery disease and peripheral vascular disease, hypertension History  Substance Use Topics  . Smoking status: Former Smoker -- 1.00 packs/day for 55 years    Types: Cigarettes    Quit date: 05/26/2011  . Smokeless tobacco: Never Used  . Alcohol Use: No   ROS as above Medications reviewed. No current facility-administered medications for this encounter.   Current Outpatient Prescriptions  Medication Sig Dispense Refill  . albuterol (PROVENTIL HFA;VENTOLIN HFA) 108 (90 BASE) MCG/ACT inhaler Inhale 2 puffs into the lungs 2 (two) times daily as needed for wheezing or shortness of breath.       Marland Kitchen amLODipine (NORVASC) 5 MG tablet Take 5 mg by mouth daily.      Marland Kitchen aspirin EC 81 MG tablet Take 81 mg by mouth 2 (two) times daily.      Marland Kitchen HYDROcodone-acetaminophen (NORCO/VICODIN) 5-325 MG per tablet Take 1 tablet by mouth every 6 (six) hours as needed for pain.      Marland Kitchen losartan-hydrochlorothiazide (HYZAAR) 100-25 MG per tablet Take 1 tablet by mouth daily.       . nitroGLYCERIN (NITROSTAT) 0.4 MG SL tablet Place 1 tablet (0.4 mg total) under the tongue every 5 (five) minutes as needed for chest pain.  25 tablet  3  . omeprazole (PRILOSEC) 20 MG capsule Take 1 capsule (20 mg total) by mouth 2 (two) times daily.      . potassium chloride (K-DUR,KLOR-CON) 10 MEQ tablet Take 1 tablet (10 mEq total) by mouth daily.  30 tablet  2  . pravastatin (PRAVACHOL) 40 MG tablet Take 1 tablet (40 mg total) by mouth every  evening.  30 tablet  11  . traMADol (ULTRAM) 50 MG tablet Take 1-2 tablets (50-100 mg total) by mouth 3 (three) times daily as needed for pain. For pain. Maximum dose= 8 tablets per day  30 tablet  0    Exam:  BP 139/85  Pulse 80  Temp(Src) 98.5 F (36.9 C) (Oral)  Resp 18  SpO2 97% Gen: Well NAD HEENT: EOMI,  MMM Lungs: CTABL Nl WOB Heart: RRR no MRG Abd: NABS, NT, ND Exts: Non edematous BL  LE, warm and well perfused.   Results for orders placed during the hospital encounter of 12/08/12 (from the past 24 hour(s))  POCT I-STAT, CHEM 8     Status: Abnormal   Collection Time    12/08/12  6:19 PM      Result Value Range   Sodium 141  135 - 145 mEq/L   Potassium 3.9  3.5 - 5.1 mEq/L   Chloride 103  96 - 112 mEq/L   BUN 11  6 - 23 mg/dL   Creatinine, Ser 1.61  0.50 - 1.35 mg/dL   Glucose, Bld 096 (*) 70 - 99 mg/dL   Calcium, Ion 0.45  4.09 - 1.30 mmol/L   TCO2 28  0 - 100 mmol/L   Hemoglobin 13.9  13.0 - 17.0 g/dL   HCT 81.1  91.4 - 78.2 %   Twelve-lead EKG  shows normal sinus rhythm at 68 beats per minute. Normal EKG  Assessment and Plan: 72 y.o. male with fatigue and anxiety.  Patient is obviously not feeling normal as he is in the urgent care Friday evening.  However he has no specific complaints aside from fatigue. He does have a pertinent past medical history significant for coronary artery disease therefore an EKG was reasonable. EKG is normal as is his i-STAT chem 8.  His CBC and TSH are pending and I will call patient if they are abnormal.  Plan for watchful waiting. If not improving or worsening presented to the emergency room, back care or, at his primary care provider.  Discussed warning signs or symptoms. Please see discharge instructions. Patient expresses understanding.      Rodolph Bong, MD 12/08/12 [phone]

## 2012-12-08 NOTE — ED Notes (Signed)
Unable to discharge-blood being drawn

## 2012-12-08 NOTE — ED Notes (Signed)
Patient is resting comfortably. 

## 2012-12-09 LAB — TSH: TSH: 2.025 u[IU]/mL (ref 0.350–4.500)

## 2012-12-25 ENCOUNTER — Telehealth: Payer: Self-pay | Admitting: Cardiovascular Disease

## 2012-12-25 NOTE — Telephone Encounter (Signed)
Faxed [facility] Surgical Clearance to [phone] on 9.12.14 at 4:29 PM/djc

## 2013-01-05 ENCOUNTER — Other Ambulatory Visit: Payer: Self-pay | Admitting: Physician Assistant

## 2013-01-05 ENCOUNTER — Encounter (HOSPITAL_COMMUNITY): Payer: Self-pay | Admitting: Pharmacy Technician

## 2013-01-08 ENCOUNTER — Encounter (HOSPITAL_COMMUNITY): Payer: Self-pay

## 2013-01-08 ENCOUNTER — Encounter (HOSPITAL_COMMUNITY)
Admission: RE | Admit: 2013-01-08 | Discharge: 2013-01-08 | Disposition: A | Discharge status: Home / Self Care | Payer: Medicare Other | Source: Ambulatory Visit | Attending: Orthopedic Surgery | Admitting: Orthopedic Surgery

## 2013-01-08 DIAGNOSIS — Z01812 Encounter for preprocedural laboratory examination: Secondary | ICD-10-CM | POA: Insufficient documentation

## 2013-01-08 DIAGNOSIS — Z01818 Encounter for other preprocedural examination: Secondary | ICD-10-CM | POA: Insufficient documentation

## 2013-01-08 HISTORY — DX: Headache: R51

## 2013-01-08 HISTORY — DX: Malignant (primary) neoplasm, unspecified: C80.1

## 2013-01-08 LAB — BASIC METABOLIC PANEL
BUN: 14 mg/dL (ref 6–23)
CO2: 27 mEq/L (ref 19–32)
Calcium: 9.4 mg/dL (ref 8.4–10.5)
Chloride: 99 mEq/L (ref 96–112)
Creatinine, Ser: 1.36 mg/dL — ABNORMAL HIGH (ref 0.50–1.35)
GFR calc Af Amer: 58 mL/min — ABNORMAL LOW (ref >90)
GFR calc non Af Amer: 50 mL/min — ABNORMAL LOW (ref >90)
Glucose, Bld: 108 mg/dL — ABNORMAL HIGH (ref 70–99)
Potassium: 3.8 mEq/L (ref 3.5–5.1)
Sodium: 137 mEq/L (ref 135–145)

## 2013-01-08 LAB — CBC
HCT: 34.8 % — ABNORMAL LOW (ref 39.0–52.0)
Hemoglobin: 11.4 g/dL — ABNORMAL LOW (ref 13.0–17.0)
MCH: 22.9 pg — ABNORMAL LOW (ref 26.0–34.0)
MCHC: 32.8 g/dL (ref 30.0–36.0)
MCV: 69.9 fL — ABNORMAL LOW (ref 78.0–100.0)
Platelets: 153 10*3/uL (ref 150–400)
RBC: 4.98 MIL/uL (ref 4.22–5.81)
RDW: 17.5 % — ABNORMAL HIGH (ref 11.5–15.5)
WBC: 5.7 10*3/uL (ref 4.0–10.5)

## 2013-01-08 NOTE — Progress Notes (Signed)
Pt denies SOB and chest pain but states that he is under the care of a cardiologist at [facility] (pt forgot cardiologist name). Pt states that he has not taken Aspirin since last week when they cancelled his surgery. Pt chart left for [place], [place] to review cardiac history. Pt advised to stop NSAIDS and herbal medications.

## 2013-01-08 NOTE — Pre-Procedure Instructions (Addendum)
GIOVONI BUNCH  01/08/2013   Your procedure is scheduled on: Friday, January 12, 2013  Report to [facility] Short Stay (use Main Entrance "A') at 8:00 AM.  Call this number if you have problems the morning of surgery: [phone]   Remember:   Do not eat food or drink liquids after midnight.   Take these medicines the morning of surgery with A SIP OF WATER: amLODipine (NORVASC) 5 MG tablet,  omeprazole (PRILOSEC) 20 MG capsule, if needed: HYDROcodone-acetaminophen (NORCO/VICODIN) 5-325 MG per tablet for pain nitroGLYCERIN (NITROSTAT) 0.4 MG SL tablet for chest pain, albuterol (PROVENTIL HFA;VENTOLIN HFA) 108 (90 BASE) MCG/ACT inhaler for wheezing or shortness of breath.( bring in on day of surgery)  Do not wear jewelry, make-up or nail polish.  Do not wear lotions, powders, or perfumes. You may  NOT wear deodorant.  Do not shave 48 hours prior to surgery. Men may shave face and neck.  Do not bring valuables to the hospital.  [facility] is not responsible  for any belongings or valuables.               Contacts, dentures or bridgework may not be worn into surgery.  Leave suitcase in the car. After surgery it may be brought to your room.  For patients admitted to the hospital, discharge time is determined by you treatment team.               Patients discharged the day of surgery will not be allowed to drive home.  Name and phone number of your driver:  Special Instructions: Shower using CHG 2 nights before surgery and the night before surgery.  If you shower the day of surgery use CHG.  Use special wash - you have one bottle of CHG for all showers.  You should use approximately 1/3 of the bottle for each shower.   Please read over the following fact sheets that you were given: Pain Booklet, Coughing and Deep Breathing and Surgical Site Infection Prevention

## 2013-01-09 NOTE — Progress Notes (Signed)
Anesthesia Chart Review:  Patient is a 72 year old male scheduled for left shoulder arthroscopy, rotator cuff repair on 01/12/13 by Dr. Madelon Lips.  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  Cardiologist is Dr. Clifton James who cleared patient for this procedure from a cardiac standpoint.  Nuclear stress test on 11/16/12 showed: Normal stress nuclear study. LV Ejection Fraction: 58%. LV Wall Motion: NL LV Function; NL Wall Motion.  Cardiac cath on 05/30/12 showed: 1. Single vessel obstructive coronary disease.  (40% pLAD, OM1 occluded, 90% pOM2, 40% pRCA, 40% dRCA, 50% pPDA.) There is chronic total occlusion of the first obtuse marginal vessel which is very small. There is a 90% stenosis in the second obtuse marginal vessel which is a large branch. The patient has diffuse atherosclerosis and coronary ectasia.  2. Normal left ventricular function, LVEF estimated at 55-65%. 3. Successful stenting of the second obtuse marginal vessel with a bare-metal stent.   Echo on 08/25/09 showed:  - Left ventricle: The cavity size was normal. Systolic function was normal. The estimated ejection fraction was in the range of 60% to 65%. - Aortic valve: Mild regurgitation.  EKG on 12/08/12 showed NSR.  2V CXR on 11/01/12 showed no acute disease of the chest.  1V CXR on 11/09/12 showed mild left basilar atelectasis.  Preoperative labs noted.  He has been cleared by his cardiologist.  If no acute changes then anticipate that he can proceed as planned.  Velna Ochs [facility] Short Stay Center/Anesthesiology Phone [phone] 01/09/2013 5:05  PM

## 2013-01-11 MED ORDER — CEFAZOLIN SODIUM-DEXTROSE 2-3 GM-% IV SOLR
2.0000 g | INTRAVENOUS | Status: AC
  Administered 2013-01-12: 2 g via INTRAVENOUS

## 2013-01-12 ENCOUNTER — Encounter (HOSPITAL_COMMUNITY)
Admission: RE | Disposition: A | Discharge status: Home / Self Care | Payer: Self-pay | Source: Ambulatory Visit | Attending: Orthopedic Surgery

## 2013-01-12 ENCOUNTER — Encounter (HOSPITAL_COMMUNITY): Payer: Self-pay | Admitting: *Deleted

## 2013-01-12 ENCOUNTER — Encounter (HOSPITAL_COMMUNITY): Payer: Self-pay | Admitting: Vascular Surgery

## 2013-01-12 ENCOUNTER — Ambulatory Visit (HOSPITAL_COMMUNITY): Payer: Medicare Other | Admitting: Anesthesiology

## 2013-01-12 ENCOUNTER — Ambulatory Visit (HOSPITAL_COMMUNITY)
Admission: RE | Admit: 2013-01-12 | Discharge: 2013-01-13 | Disposition: A | Discharge status: Home / Self Care | Payer: Medicare Other | Source: Ambulatory Visit | Attending: Orthopedic Surgery | Admitting: Orthopedic Surgery

## 2013-01-12 DIAGNOSIS — K219 Gastro-esophageal reflux disease without esophagitis: Secondary | ICD-10-CM | POA: Insufficient documentation

## 2013-01-12 DIAGNOSIS — M25819 Other specified joint disorders, unspecified shoulder: Secondary | ICD-10-CM | POA: Insufficient documentation

## 2013-01-12 DIAGNOSIS — J4489 Other specified chronic obstructive pulmonary disease: Secondary | ICD-10-CM | POA: Insufficient documentation

## 2013-01-12 DIAGNOSIS — M19019 Primary osteoarthritis, unspecified shoulder: Secondary | ICD-10-CM | POA: Insufficient documentation

## 2013-01-12 DIAGNOSIS — Z5333 Arthroscopic surgical procedure converted to open procedure: Secondary | ICD-10-CM | POA: Insufficient documentation

## 2013-01-12 DIAGNOSIS — J449 Chronic obstructive pulmonary disease, unspecified: Secondary | ICD-10-CM | POA: Insufficient documentation

## 2013-01-12 DIAGNOSIS — M67919 Unspecified disorder of synovium and tendon, unspecified shoulder: Secondary | ICD-10-CM | POA: Insufficient documentation

## 2013-01-12 DIAGNOSIS — M249 Joint derangement, unspecified: Secondary | ICD-10-CM | POA: Insufficient documentation

## 2013-01-12 DIAGNOSIS — I10 Essential (primary) hypertension: Secondary | ICD-10-CM | POA: Insufficient documentation

## 2013-01-12 DIAGNOSIS — M719 Bursopathy, unspecified: Secondary | ICD-10-CM | POA: Insufficient documentation

## 2013-01-12 HISTORY — PX: SHOULDER ARTHROSCOPY WITH OPEN ROTATOR CUFF REPAIR AND DISTAL CLAVICLE ACROMINECTOMY: SHX5683

## 2013-01-12 SURGERY — SHOULDER ARTHROSCOPY WITH OPEN ROTATOR CUFF REPAIR AND DISTAL CLAVICLE ACROMINECTOMY
Anesthesia: Regional | Site: Shoulder | Laterality: Left | Wound class: Clean

## 2013-01-12 MED ORDER — LACTATED RINGERS IV SOLN
INTRAVENOUS | Status: DC | PRN
  Administered 2013-01-12 (×2): via INTRAVENOUS

## 2013-01-12 MED ORDER — ONDANSETRON HCL 4 MG PO TABS: 4.0000 mg | ORAL_TABLET | Freq: Four times a day (QID) | ORAL | Status: DC | PRN

## 2013-01-12 MED ORDER — POTASSIUM CHLORIDE CRYS ER 10 MEQ PO TBCR
10.0000 meq | EXTENDED_RELEASE_TABLET | Freq: Every day | ORAL | Status: DC
  Administered 2013-01-12 – 2013-01-13 (×2): 10 meq via ORAL
  Filled 2013-01-12 (×2): qty 1

## 2013-01-12 MED ORDER — LOSARTAN POTASSIUM 50 MG PO TABS
100.0000 mg | ORAL_TABLET | Freq: Every day | ORAL | Status: DC
  Administered 2013-01-12 – 2013-01-13 (×2): 100 mg via ORAL
  Filled 2013-01-12 (×2): qty 2

## 2013-01-12 MED ORDER — SIMVASTATIN 20 MG PO TABS
20.0000 mg | ORAL_TABLET | Freq: Every day | ORAL | Status: DC
  Administered 2013-01-12: 20 mg via ORAL
  Filled 2013-01-12 (×2): qty 1

## 2013-01-12 MED ORDER — HYDROMORPHONE HCL PF 1 MG/ML IJ SOLN
0.5000 mg | INTRAMUSCULAR | Status: DC | PRN
  Administered 2013-01-12 – 2013-01-13 (×3): 0.5 mg via INTRAVENOUS
  Filled 2013-01-12 (×3): qty 1

## 2013-01-12 MED ORDER — ARTIFICIAL TEARS OP OINT
TOPICAL_OINTMENT | OPHTHALMIC | Status: DC | PRN
  Administered 2013-01-12: 1 via OPHTHALMIC

## 2013-01-12 MED ORDER — FENTANYL CITRATE 0.05 MG/ML IJ SOLN
50.0000 ug | INTRAMUSCULAR | Status: DC | PRN
  Administered 2013-01-12: 100 ug via INTRAVENOUS

## 2013-01-12 MED ORDER — CEFAZOLIN SODIUM 1-5 GM-% IV SOLN
INTRAVENOUS | Status: AC
  Filled 2013-01-12: qty 50

## 2013-01-12 MED ORDER — LOSARTAN POTASSIUM-HCTZ 100-25 MG PO TABS: 1.0000 | ORAL_TABLET | Freq: Every day | ORAL | Status: DC

## 2013-01-12 MED ORDER — SODIUM CHLORIDE 0.9 % IR SOLN
Status: DC | PRN
  Administered 2013-01-12: 3000 mL

## 2013-01-12 MED ORDER — MIDAZOLAM HCL 2 MG/2ML IJ SOLN
1.0000 mg | INTRAMUSCULAR | Status: DC | PRN
  Administered 2013-01-12: 2 mg via INTRAVENOUS

## 2013-01-12 MED ORDER — METOCLOPRAMIDE HCL 5 MG/ML IJ SOLN: 5.0000 mg | Freq: Three times a day (TID) | INTRAMUSCULAR | Status: DC | PRN

## 2013-01-12 MED ORDER — CHLORHEXIDINE GLUCONATE 4 % EX LIQD: 60.0000 mL | Freq: Once | CUTANEOUS | Status: DC

## 2013-01-12 MED ORDER — DOCUSATE SODIUM 100 MG PO CAPS
100.0000 mg | ORAL_CAPSULE | Freq: Two times a day (BID) | ORAL | Status: DC
  Administered 2013-01-12 – 2013-01-13 (×2): 100 mg via ORAL
  Filled 2013-01-12 (×2): qty 1

## 2013-01-12 MED ORDER — HYDROCHLOROTHIAZIDE 25 MG PO TABS
25.0000 mg | ORAL_TABLET | Freq: Every day | ORAL | Status: DC
  Administered 2013-01-12 – 2013-01-13 (×2): 25 mg via ORAL
  Filled 2013-01-12 (×3): qty 1

## 2013-01-12 MED ORDER — GLYCOPYRROLATE 0.2 MG/ML IJ SOLN
INTRAMUSCULAR | Status: DC | PRN
  Administered 2013-01-12: 0.4 mg via INTRAVENOUS

## 2013-01-12 MED ORDER — LACTATED RINGERS IV SOLN: INTRAVENOUS | Status: DC

## 2013-01-12 MED ORDER — CEFAZOLIN SODIUM-DEXTROSE 2-3 GM-% IV SOLR
2.0000 g | Freq: Four times a day (QID) | INTRAVENOUS | Status: AC
Start: 2013-01-12 — End: 2013-01-13
  Administered 2013-01-12 – 2013-01-13 (×3): 2 g via INTRAVENOUS
  Filled 2013-01-12 (×3): qty 50

## 2013-01-12 MED ORDER — OXYCODONE-ACETAMINOPHEN 5-325 MG PO TABS
1.0000 | ORAL_TABLET | ORAL | Status: DC | PRN
  Administered 2013-01-12 – 2013-01-13 (×4): 2 via ORAL
  Filled 2013-01-12 (×5): qty 2

## 2013-01-12 MED ORDER — PANTOPRAZOLE SODIUM 40 MG PO TBEC
40.0000 mg | DELAYED_RELEASE_TABLET | Freq: Every day | ORAL | Status: DC
  Administered 2013-01-13: 40 mg via ORAL
  Filled 2013-01-12: qty 1

## 2013-01-12 MED ORDER — OXYCODONE HCL 5 MG PO TABS: 5.0000 mg | ORAL_TABLET | Freq: Once | ORAL | Status: DC | PRN

## 2013-01-12 MED ORDER — SENNOSIDES-DOCUSATE SODIUM 8.6-50 MG PO TABS: 1.0000 | ORAL_TABLET | Freq: Every evening | ORAL | Status: DC | PRN

## 2013-01-12 MED ORDER — ALBUTEROL SULFATE HFA 108 (90 BASE) MCG/ACT IN AERS
2.0000 | INHALATION_SPRAY | Freq: Two times a day (BID) | RESPIRATORY_TRACT | Status: DC | PRN
  Filled 2013-01-12 (×2): qty 6.7

## 2013-01-12 MED ORDER — NEOSTIGMINE METHYLSULFATE 1 MG/ML IJ SOLN
INTRAMUSCULAR | Status: DC | PRN
  Administered 2013-01-12: 3 mg via INTRAVENOUS

## 2013-01-12 MED ORDER — FLEET ENEMA 7-19 GM/118ML RE ENEM: 1.0000 | ENEMA | Freq: Once | RECTAL | Status: AC | PRN

## 2013-01-12 MED ORDER — ASPIRIN EC 81 MG PO TBEC
81.0000 mg | DELAYED_RELEASE_TABLET | Freq: Two times a day (BID) | ORAL | Status: DC
  Administered 2013-01-12 – 2013-01-13 (×2): 81 mg via ORAL
  Filled 2013-01-12 (×3): qty 1

## 2013-01-12 MED ORDER — MIDAZOLAM HCL 2 MG/2ML IJ SOLN
INTRAMUSCULAR | Status: AC
  Filled 2013-01-12: qty 2

## 2013-01-12 MED ORDER — AMLODIPINE BESYLATE 5 MG PO TABS
5.0000 mg | ORAL_TABLET | Freq: Every day | ORAL | Status: DC
Start: 2013-01-13 — End: 2013-01-13
  Administered 2013-01-13: 5 mg via ORAL
  Filled 2013-01-12: qty 1

## 2013-01-12 MED ORDER — OXYCODONE HCL 5 MG/5ML PO SOLN: 5.0000 mg | Freq: Once | ORAL | Status: DC | PRN

## 2013-01-12 MED ORDER — NITROGLYCERIN 0.4 MG SL SUBL: 0.4000 mg | SUBLINGUAL_TABLET | SUBLINGUAL | Status: DC | PRN

## 2013-01-12 MED ORDER — FENTANYL CITRATE 0.05 MG/ML IJ SOLN
INTRAMUSCULAR | Status: AC
  Filled 2013-01-12: qty 2

## 2013-01-12 MED ORDER — ONDANSETRON HCL 4 MG/2ML IJ SOLN: 4.0000 mg | Freq: Four times a day (QID) | INTRAMUSCULAR | Status: DC | PRN

## 2013-01-12 MED ORDER — ONDANSETRON HCL 4 MG/2ML IJ SOLN
INTRAMUSCULAR | Status: DC | PRN
  Administered 2013-01-12: 4 mg via INTRAVENOUS

## 2013-01-12 MED ORDER — OXYCODONE-ACETAMINOPHEN 5-325 MG PO TABS: ORAL_TABLET | ORAL | Status: DC

## 2013-01-12 MED ORDER — FENTANYL CITRATE 0.05 MG/ML IJ SOLN
INTRAMUSCULAR | Status: DC | PRN
  Administered 2013-01-12: 50 ug via INTRAVENOUS

## 2013-01-12 MED ORDER — SODIUM CHLORIDE 0.9 % IV SOLN
INTRAVENOUS | Status: DC
  Administered 2013-01-12: 23:00:00 via INTRAVENOUS

## 2013-01-12 MED ORDER — BISACODYL 10 MG RE SUPP: 10.0000 mg | Freq: Every day | RECTAL | Status: DC | PRN

## 2013-01-12 MED ORDER — LIDOCAINE HCL (CARDIAC) 20 MG/ML IV SOLN
INTRAVENOUS | Status: DC | PRN
  Administered 2013-01-12: 40 mg via INTRAVENOUS

## 2013-01-12 MED ORDER — SODIUM CHLORIDE 0.9 % IV SOLN: INTRAVENOUS | Status: DC

## 2013-01-12 MED ORDER — METOCLOPRAMIDE HCL 10 MG PO TABS: 5.0000 mg | ORAL_TABLET | Freq: Three times a day (TID) | ORAL | Status: DC | PRN

## 2013-01-12 MED ORDER — ROCURONIUM BROMIDE 100 MG/10ML IV SOLN
INTRAVENOUS | Status: DC | PRN
  Administered 2013-01-12: 50 mg via INTRAVENOUS

## 2013-01-12 MED ORDER — PROPOFOL 10 MG/ML IV BOLUS
INTRAVENOUS | Status: DC | PRN
  Administered 2013-01-12: 100 mg via INTRAVENOUS

## 2013-01-12 MED ORDER — HYDROMORPHONE HCL PF 1 MG/ML IJ SOLN: 0.2500 mg | INTRAMUSCULAR | Status: DC | PRN

## 2013-01-12 SURGICAL SUPPLY — 59 items
ANCHOR PEEK 4.75X19.1 SWLK C (Anchor) ×2 IMPLANT
ANCHOR PEEK SWIVEL LOCK 5.5 (Anchor) ×2 IMPLANT
BIT DRILL TAK (DRILL) IMPLANT
BLADE CUDA 5.5 (BLADE) IMPLANT
BLADE GREAT WHITE 4.2 (BLADE) ×2 IMPLANT
BLADE LONG MED 31X9 (MISCELLANEOUS) IMPLANT
BLADE SURG 11 STRL SS (BLADE) ×2 IMPLANT
BUR OVAL 6.0 (BURR) ×2 IMPLANT
CANNULA SHOULDER 7CM (CANNULA) ×2 IMPLANT
CLOTH BEACON ORANGE TIMEOUT ST (SAFETY) ×2 IMPLANT
CLSR STERI-STRIP ANTIMIC 1/2X4 (GAUZE/BANDAGES/DRESSINGS) ×2 IMPLANT
DRAPE STERI 35X30 U-POUCH (DRAPES) ×2 IMPLANT
DRAPE SURG 17X23 STRL (DRAPES) IMPLANT
DRAPE U-SHAPE 47X51 STRL (DRAPES) IMPLANT
DRILL TAK (DRILL)
DRSG ADAPTIC 3X8 NADH LF (GAUZE/BANDAGES/DRESSINGS) ×2 IMPLANT
DRSG PAD ABDOMINAL 8X10 ST (GAUZE/BANDAGES/DRESSINGS) ×2 IMPLANT
DURAPREP 26ML APPLICATOR (WOUND CARE) ×2 IMPLANT
GLOVE BIOGEL PI IND STRL 8 (GLOVE) ×4 IMPLANT
GLOVE BIOGEL PI INDICATOR 8 (GLOVE) ×4
GLOVE ORTHO TXT STRL SZ7.5 (GLOVE) ×12 IMPLANT
GLOVE SURG ORTHO 8.0 STRL STRW (GLOVE) ×10 IMPLANT
GOWN PREVENTION PLUS XLARGE (GOWN DISPOSABLE) ×8 IMPLANT
GOWN STRL NON-REIN LRG LVL3 (GOWN DISPOSABLE) ×4 IMPLANT
IV NS IRRIG 3000ML ARTHROMATIC (IV SOLUTION) ×4 IMPLANT
KIT BASIN OR (CUSTOM PROCEDURE TRAY) ×2 IMPLANT
KIT ROOM TURNOVER OR (KITS) ×2 IMPLANT
MANIFOLD NEPTUNE II (INSTRUMENTS) ×2 IMPLANT
NEEDLE 22X1 1/2 (OR ONLY) (NEEDLE) ×2 IMPLANT
NEEDLE SCORPION MULTI FIRE (NEEDLE) ×2 IMPLANT
NEEDLE SPNL 18GX3.5 QUINCKE PK (NEEDLE) ×2 IMPLANT
NS IRRIG 1000ML POUR BTL (IV SOLUTION) ×2 IMPLANT
PACK SHOULDER (CUSTOM PROCEDURE TRAY) ×2 IMPLANT
PAD ARMBOARD 7.5X6 YLW CONV (MISCELLANEOUS) ×4 IMPLANT
SET ARTHROSCOPY TUBING (MISCELLANEOUS) ×1
SET ARTHROSCOPY TUBING LN (MISCELLANEOUS) ×1 IMPLANT
SPEAR FASTAKII (SLEEVE) IMPLANT
SPONGE GAUZE 4X4 12PLY (GAUZE/BANDAGES/DRESSINGS) ×2 IMPLANT
SPONGE LAP 4X18 X RAY DECT (DISPOSABLE) ×2 IMPLANT
SUCTION FRAZIER TIP 10 FR DISP (SUCTIONS) ×2 IMPLANT
SUT ETHIBOND NAB CT1 #1 30IN (SUTURE) IMPLANT
SUT ETHILON 3 0 PS 1 (SUTURE) ×2 IMPLANT
SUT ETHILON 4 0 PS 2 18 (SUTURE) ×2 IMPLANT
SUT FIBERWIRE #2 38 T-5 BLUE (SUTURE)
SUT MNCRL AB 3-0 PS2 18 (SUTURE) ×2 IMPLANT
SUT TIGER TAPE 7 IN WHITE (SUTURE) ×2 IMPLANT
SUT VIC AB 0 CT1 27 (SUTURE) ×2
SUT VIC AB 0 CT1 27XBRD ANBCTR (SUTURE) ×2 IMPLANT
SUT VIC AB 2-0 CT1 27 (SUTURE)
SUT VIC AB 2-0 CT1 TAPERPNT 27 (SUTURE) IMPLANT
SUTURE FIBERWR #2 38 T-5 BLUE (SUTURE) IMPLANT
SYR 20ML ECCENTRIC (SYRINGE) ×2 IMPLANT
SYR CONTROL 10ML LL (SYRINGE) ×2 IMPLANT
TAPE CLOTH SURG 6X10 WHT LF (GAUZE/BANDAGES/DRESSINGS) ×2 IMPLANT
TAPE FIBER 2MM 7IN #2 BLUE (SUTURE) ×2 IMPLANT
TOWEL OR 17X24 6PK STRL BLUE (TOWEL DISPOSABLE) ×2 IMPLANT
TOWEL OR 17X26 10 PK STRL BLUE (TOWEL DISPOSABLE) ×2 IMPLANT
WAND 90 DEG TURBOVAC W/CORD (SURGICAL WAND) IMPLANT
WATER STERILE IRR 1000ML POUR (IV SOLUTION) ×2 IMPLANT

## 2013-01-12 NOTE — Transfer of Care (Signed)
Immediate Anesthesia Transfer of Care Note  Patient: Joel [name]  Procedure(s) Performed: Procedure(s): LEFT SHOULDER ARTHROSCOPY WITH DEBRIDEMENT OPEN DISTAL CLAVICLE RESECTION ,acromioplastyAND ROTATOR CUFF REPAIR (Left)  Patient Location: PACU  Anesthesia Type:GA combined with regional for post-op pain  Level of Consciousness: awake, alert  and oriented  Airway & Oxygen Therapy: Patient Spontanous Breathing and Patient connected to nasal cannula oxygen  Post-op Assessment: Report given to PACU RN  Post vital signs: Reviewed and stable  Complications: No apparent anesthesia complications

## 2013-01-12 NOTE — Anesthesia Postprocedure Evaluation (Signed)
  Anesthesia Post-op Note  Patient: Joel [name]  Procedure(s) Performed: Procedure(s): LEFT SHOULDER ARTHROSCOPY WITH DEBRIDEMENT OPEN DISTAL CLAVICLE RESECTION ,acromioplastyAND ROTATOR CUFF REPAIR (Left)  Patient Location: PACU  Anesthesia Type:GA combined with regional for post-op pain  Level of Consciousness: awake and alert   Airway and Oxygen Therapy: Patient Spontanous Breathing  Post-op Pain: none  Post-op Assessment: Post-op Vital signs reviewed, Patient's Cardiovascular Status Stable, Respiratory Function Stable, Patent Airway, No signs of Nausea or vomiting and Pain level controlled  Post-op Vital Signs: Reviewed and stable  Complications: No apparent anesthesia complications

## 2013-01-12 NOTE — H&P (Signed)
Joel [name] is an 72 y.o. male.   Chief Complaint: left shoulder pain HPI: patient followed in our outpatient office, longtime history of left shoulder pain following a fall with acute excerbation of his pain, repeated MRI which shows cuff tear is still likely repairable 1.4cm retraction, significant AC arthropathy with bursitis.  He has failed conservative treatment and seeking surgical intervention.  Felt to have cardiac risks may benefit to have surgery performed in hospital setting vs outpatient center.  Past Medical History  Diagnosis Date  . GERD (gastroesophageal reflux disease)   . HLD (hyperlipidemia)   . HTN (hypertension)   . Cough secondary to angiotensin converting enzyme inhibitor (ACE-I)   . Peripheral vascular disease   . CAD (coronary artery disease)     A. Negative MV 04/2010;  B. Inadequate Stress Echo 05/2011;  C. 05/29/11 Cath: - Med Rx;  D. 05/2012 Cath/PCI: LM nl, LAD 40p, LCX ectatic, OM1 100m, OM2 90p (3.5x16 Veriflex BMS), RCA dominant 40p/d, PDA 50p, EF 55-65%.  . Iron deficiency anemia   . DVT (deep venous thrombosis)   . Microcytic anemia   . COPD (chronic obstructive pulmonary disease)   . Angina   . Shortness of breath     "can come on at anytime" (11/09/2012)  . History of blood transfusion     "I've had 2; don't know what it was related to" (11/09/2012)  . Rotator cuff injury     "left arm; never repaired" (11/09/2012)  . Headache(784.0)     Hx: of  . Cancer     Hx: of prostate    Past Surgical History  Procedure Laterality Date  . Iliac artery aneurysm repair    . Circumcision    . Pr vein bypass graft,aorto-fem-pop Left 10/01/2009    left below knee popliteal artery to posterior tibial artery  . Cataract extraction w/ intraocular lens implant Right ~ 2008  . Coronary angioplasty with stent placement  2014    "1" (11/09/2012)  . Left heart cath  05-31-12  . Cardiovascular stress test  Aug. 2014    Family History  Problem Relation Age of Onset   . Breast cancer Mother     deceased  . Cancer Mother   . Hyperlipidemia Mother   . Hypertension Mother   . COPD Father     deceased  . Cancer Father   . Hyperlipidemia Father   . Hypertension Father   . Hypertension Sister   . Cancer Brother   . Hypertension Brother   . Hypertension Daughter   . Hypertension Son    Social History:  reports that he quit smoking about 19 months ago. His smoking use included Cigarettes. He has a 55 pack-year smoking history. He has never used smokeless tobacco. He reports that he does not drink alcohol or use illicit drugs.  Allergies:  Allergies  Allergen Reactions  . Imdur [Isosorbide Mononitrate] Nausea Only and Other (See Comments)    Headache  . Lisinopril Other (See Comments)    Cough  . Topiramate     Shortness of breath and leg cramps     (Not in a hospital admission)  No results found for this or any previous visit (from the past 48 hour(s)). No results found.  Review of Systems  Constitutional: Negative.   HENT: Negative for hearing loss, ear pain, nosebleeds, tinnitus and ear discharge.   Eyes: Negative.   Respiratory: Positive for shortness of breath. Negative for cough and hemoptysis.   Cardiovascular: Negative.     Gastrointestinal: Negative.   Genitourinary: Negative.   Musculoskeletal: Positive for myalgias and joint pain.  Skin: Negative.   Neurological: Positive for headaches. Negative for dizziness, tingling, tremors, sensory change and loss of consciousness.  Endo/Heme/Allergies: Negative.   Psychiatric/Behavioral: Negative for depression and suicidal ideas. The patient is nervous/anxious.     There were no vitals taken for this visit. Physical Exam  Constitutional: He is oriented to person, place, and time. He appears well-developed and well-nourished. No distress.  HENT:  Head: Normocephalic and atraumatic.  Nose: Nose normal.  Eyes: Conjunctivae and EOM are normal. Pupils are equal, round, and reactive to  light.  Neck: Normal range of motion. Neck supple.  Cardiovascular: Normal rate, regular rhythm and intact distal pulses.   Respiratory: Effort normal and breath sounds normal. No respiratory distress.  GI: Soft. Bowel sounds are normal. He exhibits no distension. There is no tenderness.  Musculoskeletal:       Left shoulder: He exhibits decreased range of motion, tenderness, bony tenderness, pain and decreased strength.  Lymphadenopathy:    He has no cervical adenopathy.  Neurological: He is alert and oriented to person, place, and time. No cranial nerve deficit.  Skin: Skin is warm and dry. No rash noted. No erythema.  Psychiatric: He has a normal mood and affect. His behavior is normal.     Assessment/Plan Left shoulder chronic rotator cuff tear  Plan is for left shoulder arthroscopy with open rotator cuff repair, acromioplasty, distal clavicle excision.  Nerve block, overnight stay with d/c home following.    Arliss Frisina 01/12/2013, 7:38 AM    

## 2013-01-12 NOTE — Brief Op Note (Signed)
01/12/2013  12:46 PM  PATIENT:  Joel [name]  72 y.o. male  PRE-OPERATIVE DIAGNOSIS:   LEFT SHOULDER ROTATOR TEAR   POST-OPERATIVE DIAGNOSIS:   LEFT SHOULDER ROTATOR TEAR   PROCEDURE:  Procedure(s): LEFT SHOULDER ARTHROSCOPY WITH DEBRIDEMENT OPEN DISTAL CLAVICLE RESECTION ,acromioplastyAND ROTATOR CUFF REPAIR (Left)  SURGEON:  Surgeon(s) and Role:    * W D Carloyn Manner., MD - Primary  PHYSICIAN ASSISTANT: Margart Sickles, PA-C  ASSISTANTS:   ANESTHESIA:   general and IS Block  EBL:  Total I/O In: 1000 [I.V.:1000] Out: 100 [Blood:100]  BLOOD ADMINISTERED:none  DRAINS: none   LOCAL MEDICATIONS USED:  NONE  SPECIMEN:  No Specimen  DISPOSITION OF SPECIMEN:  N/A  COUNTS:  YES  TOURNIQUET:  * No tourniquets in log *  DICTATION: .Other Dictation: Dictation Number unknown  PLAN OF CARE: Admit for overnight observation  PATIENT DISPOSITION:  PACU - hemodynamically stable.   Delay start of Pharmacological VTE agent (>24hrs) due to surgical blood loss or risk of bleeding: not applicable

## 2013-01-12 NOTE — H&P (View-Only) (Signed)
Joel [name] is an 72 y.o. male.   Chief Complaint: left shoulder pain HPI: patient followed in our outpatient office, longtime history of left shoulder pain following a fall with acute excerbation of his pain, repeated MRI which shows cuff tear is still likely repairable 1.4cm retraction, significant AC arthropathy with bursitis.  He has failed conservative treatment and seeking surgical intervention.  Felt to have cardiac risks may benefit to have surgery performed in hospital setting vs outpatient center.  Past Medical History  Diagnosis Date  . GERD (gastroesophageal reflux disease)   . HLD (hyperlipidemia)   . HTN (hypertension)   . Cough secondary to angiotensin converting enzyme inhibitor (ACE-I)   . Peripheral vascular disease   . CAD (coronary artery disease)     A. Negative MV 04/2010;  B. Inadequate Stress Echo 05/2011;  C. 05/29/11 Cath: - Med Rx;  D. 05/2012 Cath/PCI: LM nl, LAD 40p, LCX ectatic, OM1 152m, OM2 90p (3.5x16 Veriflex BMS), RCA dominant 40p/d, PDA 50p, EF 55-65%.  . Iron deficiency anemia   . DVT (deep venous thrombosis)   . Microcytic anemia   . COPD (chronic obstructive pulmonary disease)   . Angina   . Shortness of breath     "can come on at anytime" (11/09/2012)  . History of blood transfusion     "I've had 2; don't know what it was related to" (11/09/2012)  . Rotator cuff injury     "left arm; never repaired" (11/09/2012)  . Headache(784.0)     Hx: of  . Cancer     Hx: of prostate    Past Surgical History  Procedure Laterality Date  . Iliac artery aneurysm repair    . Circumcision    . Pr vein bypass graft,aorto-fem-pop Left 10/01/2009    left below knee popliteal artery to posterior tibial artery  . Cataract extraction w/ intraocular lens implant Right ~ 2008  . Coronary angioplasty with stent placement  2014    "1" (11/09/2012)  . Left heart cath  05-31-12  . Cardiovascular stress test  Aug. 2014    Family History  Problem Relation Age of Onset   . Breast cancer Mother     deceased  . Cancer Mother   . Hyperlipidemia Mother   . Hypertension Mother   . COPD Father     deceased  . Cancer Father   . Hyperlipidemia Father   . Hypertension Father   . Hypertension Sister   . Cancer Brother   . Hypertension Brother   . Hypertension Daughter   . Hypertension Son    Social History:  reports that he quit smoking about 19 months ago. His smoking use included Cigarettes. He has a 55 pack-year smoking history. He has never used smokeless tobacco. He reports that he does not drink alcohol or use illicit drugs.  Allergies:  Allergies  Allergen Reactions  . Imdur [Isosorbide Mononitrate] Nausea Only and Other (See Comments)    Headache  . Lisinopril Other (See Comments)    Cough  . Topiramate     Shortness of breath and leg cramps     (Not in a hospital admission)  No results found for this or any previous visit (from the past 48 hour(s)). No results found.  Review of Systems  Constitutional: Negative.   HENT: Negative for hearing loss, ear pain, nosebleeds, tinnitus and ear discharge.   Eyes: Negative.   Respiratory: Positive for shortness of breath. Negative for cough and hemoptysis.   Cardiovascular: Negative.  Gastrointestinal: Negative.   Genitourinary: Negative.   Musculoskeletal: Positive for myalgias and joint pain.  Skin: Negative.   Neurological: Positive for headaches. Negative for dizziness, tingling, tremors, sensory change and loss of consciousness.  Endo/Heme/Allergies: Negative.   Psychiatric/Behavioral: Negative for depression and suicidal ideas. The patient is nervous/anxious.     There were no vitals taken for this visit. Physical Exam  Constitutional: He is oriented to person, place, and time. He appears well-developed and well-nourished. No distress.  HENT:  Head: Normocephalic and atraumatic.  Nose: Nose normal.  Eyes: Conjunctivae and EOM are normal. Pupils are equal, round, and reactive to  light.  Neck: Normal range of motion. Neck supple.  Cardiovascular: Normal rate, regular rhythm and intact distal pulses.   Respiratory: Effort normal and breath sounds normal. No respiratory distress.  GI: Soft. Bowel sounds are normal. He exhibits no distension. There is no tenderness.  Musculoskeletal:       Left shoulder: He exhibits decreased range of motion, tenderness, bony tenderness, pain and decreased strength.  Lymphadenopathy:    He has no cervical adenopathy.  Neurological: He is alert and oriented to person, place, and time. No cranial nerve deficit.  Skin: Skin is warm and dry. No rash noted. No erythema.  Psychiatric: He has a normal mood and affect. His behavior is normal.     Assessment/Plan Left shoulder chronic rotator cuff tear  Plan is for left shoulder arthroscopy with open rotator cuff repair, acromioplasty, distal clavicle excision.  Nerve block, overnight stay with d/c home following.    Margart Sickles 01/12/2013, 7:38 AM

## 2013-01-12 NOTE — Op Note (Signed)
NAME:  Joel [name], Joel [name] NO.:  [id number]  MEDICAL RECORD NO.:  [id number]  LOCATION:  [id number]                        FACILITY:  [facility]  PHYSICIAN:  Dyke Brackett, M.D.    DATE OF BIRTH:  1940/12/17  DATE OF PROCEDURE:  01/12/2013 DATE OF DISCHARGE:                              OPERATIVE REPORT   PREOPERATIVE DIAGNOSES: 1. Rotator cuff tear. 2. Impingement. 3. Acromioclavicular joint arthritis. 4. Partial biceps tendon tear. 5. Degenerative tear in anterior superior labrum. 6. Early glenohumeral arthritis.  PREOPERATIVE DIAGNOSES: 1. Rotator cuff tear. 2. Impingement. 3. Acromioclavicular joint arthritis. 4. Partial biceps tendon tear. 5. Degenerative tear in anterior superior labrum. 6. Early glenohumeral arthritis.  OPERATION: 1. Arthroscopic debridement (extensive). 2. Open rotator cuff repair acromioplasty (chronic). 3. Open distal clavicle all for the left shoulder.  SURGEON:  Dyke Brackett, M.D.  ASSISTANT:  Margart Sickles, PA-C.  BLOOD LOSS:  Minimal.  ANESTHESIA:  General with nerve blocks.  DESCRIPTION OF PROCEDURE:  Beach-chair positioning.  Arthroscopic portals anterior and posteriorly.  intraarticular inspection showed very mild degenerative change on the glenohumeral joint, grade 2 and 3, debrided.  Extensive tearing of the anterior superior labrum debrided. Partial biceps tendon tear estimated the flexion about 50% thickness debrided.  About a 5-cm rotator cuff tear identified.  We elected to use significant impingement from the [facility] joint and the acromion.  After arthroscopic debridement, we did a decision opening the shoulder bisecting the interval between the [facility] and acromion and split the deltoid about 2-3 cm distal to the tip of the acromion, severe impingement was noted from the clavicle, clavicular arthritis as well as type 3 acromion.  Acromioplasty of distal clavicle, 1 cm and 1.5 cm, clavicle was excised.  This revealed the  cuff tissue was moderately attenuated and retracted. We did mobilize the pressure with 15 blade, burred the superior tuberosity.  We then used the swivel lock system from the [employer].  We placed a 4.575 swivel lock with 2 fiber tapes through and through the tissue and the swivel lock had been burred into the tuberosity for medial row and then used one 5.5 swivel lock for a lateral row.  This essentially created a watertight repair and we brought the tissue back to normal attachment site.  Wound was irrigated.  We closed the deltoid with #1 Ethibond sutures, the significant tissue with 2-0 Vicryl and Monocryl on the skin.  Lightly compressive sterile dressing, mini pillow, splint applied, taken to the recovery room in stable condition.     Dyke Brackett, M.D.     WDC/MEDQ  D:  01/12/2013  T:  01/12/2013  Job:  [id number]

## 2013-01-12 NOTE — Preoperative (Signed)
Beta Blockers   Reason not to administer Beta Blockers:Not Applicable 

## 2013-01-12 NOTE — Progress Notes (Signed)
Orthopedic Tech Progress Note Patient Details:  Joel [name] 05-May-1940 [id number] Shoulder abd. Pillow delivered to OR desk Ortho Devices Type of Ortho Device: Shoulder abduction pillow Ortho Device/Splint Interventions: Ordered   [place] R Thompson 01/12/2013, 12:16 PM

## 2013-01-12 NOTE — Interval H&P Note (Signed)
History and Physical Interval Note:  01/12/2013 9:38 AM  Joel [name]  has presented today for surgery, with the diagnosis of LEFT SHOULDER ROTATOR TEAR   The various methods of treatment have been discussed with the patient and family. After consideration of risks, benefits and other options for treatment, the patient has consented to  Procedure(s): LEFT SHOULDER ARTHROSCOPY WITH DEBRIDEMENT OPEN DISTAL CLAVICLE RESECTION AND ROTATOR CUFF REPAIR (Left) as a surgical intervention .  The patient's history has been reviewed, patient examined, no change in status, stable for surgery.  I have reviewed the patient's chart and labs.  Questions were answered to the patient's satisfaction.     Joel [name],W D

## 2013-01-12 NOTE — Progress Notes (Signed)
GASPARE NETZEL [id number]  Transfer Data: 01/12/2013 3:50 PM  Attending Provider: Thera Flake., MD  WJX:[id number],NFAOZH D, MD  Code Status: FULL ISRRAEL FLUCKIGER is a 72 y.o. male patient transferred from PACU -No acute distress noted.  -No complaints of shortness of breath.  -No complaints of chest pain.  Cardiac Monitoring:  Box # 31 in place.  Cardiac monitor yields:normal sinus rhythm.  Blood pressure 126/67, pulse 60, temperature 97.5 F (36.4 C), temperature source Oral, resp. rate 16, SpO2 100.00%.  ?  IV Fluids: IV in place, occlusive dsg intact without redness, IV cath wrist right, condition patent and no redness  normal saline.  Allergies: Imdur; Lisinopril; and Topiramate  Past Medical History:  has a past medical history of GERD (gastroesophageal reflux disease); HLD (hyperlipidemia); HTN (hypertension); Cough secondary to angiotensin converting enzyme inhibitor (ACE-I); Peripheral vascular disease; CAD (coronary artery disease); Iron deficiency anemia; DVT (deep venous thrombosis); Microcytic anemia; COPD (chronic obstructive pulmonary disease); Angina; Shortness of breath; History of blood transfusion; Rotator cuff injury; Headache(784.0); and Cancer.  Past Surgical History:  has past surgical history that includes Iliac artery aneurysm repair; Circumcision; vein bypass graft,aorto-fem-pop (Left, 10/01/2009); Cataract extraction w/ intraocular lens implant (Right, ~ 2008); Coronary angioplasty with stent (2014); Left heart cath (05-31-12); and Cardiovascular stress test (Aug. 2014).  Social History:  reports that he quit smoking about 19 months ago. His smoking use included Cigarettes. He has a 55 pack-year smoking history. He has never used smokeless tobacco. He reports that he does not drink alcohol or use illicit drugs.  Skin: Intact. Incision on Left Shoulder.  Patient/Family orientated to room. Information packet given to patient/family. Admission inpatient armband  information verified with patient/family to include name and date of birth and placed on patient arm. Side rails up x 2, fall assessment and education completed with patient/family. Patient/family able to verbalize understanding of risk associated with falls and verbalized understanding to call for assistance before getting out of bed. Call light within reach. Patient/family able to voice and demonstrate understanding of unit orientation instructions.  Will continue to evaluate and treat per MD orders.

## 2013-01-12 NOTE — Anesthesia Preprocedure Evaluation (Signed)
Anesthesia Evaluation  Patient identified by MRN, date of birth, ID band Patient awake    Reviewed: Allergy & Precautions, H&P , NPO status , Patient's Chart, lab work & pertinent test results  Airway Mallampati: II  Neck ROM: full    Dental   Pulmonary shortness of breath, COPD         Cardiovascular hypertension, Pt. on medications + angina + CAD, + Cardiac Stents and + Peripheral Vascular Disease     Neuro/Psych  Headaches,  Neuromuscular disease    GI/Hepatic GERD-  ,  Endo/Other  obese  Renal/GU      Musculoskeletal   Abdominal   Peds  Hematology   Anesthesia Other Findings   Reproductive/Obstetrics                           Anesthesia Physical Anesthesia Plan  ASA: III  Anesthesia Plan: General and Regional   Post-op Pain Management: MAC Combined w/ Regional for Post-op pain   Induction: Intravenous  Airway Management Planned: Oral ETT  Additional Equipment:   Intra-op Plan:   Post-operative Plan: Extubation in OR  Informed Consent: I have reviewed the patients History and Physical, chart, labs and discussed the procedure including the risks, benefits and alternatives for the proposed anesthesia with the patient or authorized representative who has indicated his/her understanding and acceptance.     Plan Discussed with: CRNA, Anesthesiologist and Surgeon  Anesthesia Plan Comments:         Anesthesia Quick Evaluation

## 2013-01-16 ENCOUNTER — Encounter (HOSPITAL_COMMUNITY): Payer: Self-pay | Admitting: Orthopedic Surgery

## 2013-02-04 ENCOUNTER — Emergency Department (HOSPITAL_COMMUNITY)
Admission: EM | Admit: 2013-02-04 | Discharge: 2013-02-05 | Disposition: A | Discharge status: Home / Self Care | Payer: Medicare Other | Attending: Emergency Medicine | Admitting: Emergency Medicine

## 2013-02-04 ENCOUNTER — Encounter (HOSPITAL_COMMUNITY): Payer: Self-pay | Admitting: Emergency Medicine

## 2013-02-04 DIAGNOSIS — Z7982 Long term (current) use of aspirin: Secondary | ICD-10-CM | POA: Insufficient documentation

## 2013-02-04 DIAGNOSIS — I2510 Atherosclerotic heart disease of native coronary artery without angina pectoris: Secondary | ICD-10-CM | POA: Insufficient documentation

## 2013-02-04 DIAGNOSIS — E785 Hyperlipidemia, unspecified: Secondary | ICD-10-CM | POA: Insufficient documentation

## 2013-02-04 DIAGNOSIS — Z87891 Personal history of nicotine dependence: Secondary | ICD-10-CM | POA: Insufficient documentation

## 2013-02-04 DIAGNOSIS — Z8546 Personal history of malignant neoplasm of prostate: Secondary | ICD-10-CM | POA: Insufficient documentation

## 2013-02-04 DIAGNOSIS — Z9861 Coronary angioplasty status: Secondary | ICD-10-CM | POA: Insufficient documentation

## 2013-02-04 DIAGNOSIS — R040 Epistaxis: Secondary | ICD-10-CM | POA: Insufficient documentation

## 2013-02-04 DIAGNOSIS — K219 Gastro-esophageal reflux disease without esophagitis: Secondary | ICD-10-CM | POA: Insufficient documentation

## 2013-02-04 DIAGNOSIS — I10 Essential (primary) hypertension: Secondary | ICD-10-CM | POA: Insufficient documentation

## 2013-02-04 DIAGNOSIS — Z862 Personal history of diseases of the blood and blood-forming organs and certain disorders involving the immune mechanism: Secondary | ICD-10-CM | POA: Insufficient documentation

## 2013-02-04 DIAGNOSIS — J441 Chronic obstructive pulmonary disease with (acute) exacerbation: Secondary | ICD-10-CM | POA: Insufficient documentation

## 2013-02-04 DIAGNOSIS — Z79899 Other long term (current) drug therapy: Secondary | ICD-10-CM | POA: Insufficient documentation

## 2013-02-04 DIAGNOSIS — Z86718 Personal history of other venous thrombosis and embolism: Secondary | ICD-10-CM | POA: Insufficient documentation

## 2013-02-04 DIAGNOSIS — Z87828 Personal history of other (healed) physical injury and trauma: Secondary | ICD-10-CM | POA: Insufficient documentation

## 2013-02-04 NOTE — ED Notes (Signed)
Patient with nosebleed since 6pm.  Patient is on ASA 81mg  daily.

## 2013-02-04 NOTE — ED Notes (Signed)
Was cooking dinner approx 2000, noted blood coming from left nare.  Bleeding has stopped.  Here to see what caused it.

## 2013-02-05 MED ORDER — MUPIROCIN 2 % EX OINT
TOPICAL_OINTMENT | Freq: Two times a day (BID) | CUTANEOUS | Status: DC
  Administered 2013-02-05: 1 via NASAL
  Filled 2013-02-05: qty 22

## 2013-02-05 NOTE — ED Notes (Signed)
MD at bedside. 

## 2013-02-05 NOTE — ED Provider Notes (Signed)
CSN: [id number]     Arrival date & time 02/04/13  2206 History   First MD Initiated Contact with Patient 02/05/13 0050     Chief Complaint  Patient presents with  . Epistaxis   (Consider location/radiation/quality/duration/timing/severity/associated sxs/prior Treatment) HPI 72 year old male presents to emergency room with complaint of left sided nose bleed starting this evening.  Symptoms started around 6 PM.  He has been unable to control bleeding until just prior to arrival in the emergency room.  Patient requests evaluation for his bleeding.  Patient is not on blood thinners.  He takes a baby aspirin twice a day.  He has had no pain or trauma to the nose.  No prior history of same. Past Medical History  Diagnosis Date  . GERD (gastroesophageal reflux disease)   . HLD (hyperlipidemia)   . HTN (hypertension)   . Cough secondary to angiotensin converting enzyme inhibitor (ACE-I)   . Peripheral vascular disease   . CAD (coronary artery disease)     A. Negative MV 04/2010;  B. Inadequate Stress Echo 05/2011;  C. 05/29/11 Cath: - Med Rx;  D. 05/2012 Cath/PCI: LM nl, LAD 40p, LCX ectatic, OM1 129m, OM2 90p (3.5x16 Veriflex BMS), RCA dominant 40p/d, PDA 50p, EF 55-65%.  . Iron deficiency anemia   . DVT (deep venous thrombosis)   . Microcytic anemia   . COPD (chronic obstructive pulmonary disease)   . Angina   . Shortness of breath     "can come on at anytime" (11/09/2012)  . History of blood transfusion     "I've had 2; don't know what it was related to" (11/09/2012)  . Rotator cuff injury     "left arm; never repaired" (11/09/2012)  . Headache(784.0)     Hx: of  . Cancer     Hx: of prostate   Past Surgical History  Procedure Laterality Date  . Iliac artery aneurysm repair    . Circumcision    . Pr vein bypass graft,aorto-fem-pop Left 10/01/2009    left below knee popliteal artery to posterior tibial artery  . Cataract extraction w/ intraocular lens implant Right ~ 2008  . Coronary  angioplasty with stent placement  2014    "1" (11/09/2012)  . Left heart cath  05-31-12  . Cardiovascular stress test  Aug. 2014  . Shoulder arthroscopy with open rotator cuff repair and distal clavicle acrominectomy Left 01/12/2013    Procedure: LEFT SHOULDER ARTHROSCOPY WITH DEBRIDEMENT OPEN DISTAL CLAVICLE RESECTION ,acromioplastyAND ROTATOR CUFF REPAIR;  Surgeon: Thera Flake., MD;  Location: [facility] OR;  Service: Orthopedics;  Laterality: Left;   Family History  Problem Relation Age of Onset  . Breast cancer Mother     deceased  . Cancer Mother   . Hyperlipidemia Mother   . Hypertension Mother   . COPD Father     deceased  . Cancer Father   . Hyperlipidemia Father   . Hypertension Father   . Hypertension Sister   . Cancer Brother   . Hypertension Brother   . Hypertension Daughter   . Hypertension Son    History  Substance Use Topics  . Smoking status: Former Smoker -- 1.00 packs/day for 55 years    Types: Cigarettes    Quit date: 05/26/2011  . Smokeless tobacco: Never Used  . Alcohol Use: No    Review of Systems  See History of Present Illness; otherwise all other systems are reviewed and negative  Allergies  Imdur; Lisinopril; and Topiramate  Home Medications  Current Outpatient Rx  Name  Route  Sig  Dispense  Refill  . albuterol (PROVENTIL HFA;VENTOLIN HFA) 108 (90 BASE) MCG/ACT inhaler   Inhalation   Inhale 2 puffs into the lungs 2 (two) times daily as needed for wheezing or shortness of breath.          Marland Kitchen amLODipine (NORVASC) 5 MG tablet   Oral   Take 5 mg by mouth daily.         Marland Kitchen aspirin EC 81 MG tablet   Oral   Take 81 mg by mouth 2 (two) times daily.         Marland Kitchen losartan-hydrochlorothiazide (HYZAAR) 100-25 MG per tablet   Oral   Take 1 tablet by mouth daily.          Marland Kitchen omeprazole (PRILOSEC) 20 MG capsule   Oral   Take 1 capsule (20 mg total) by mouth 2 (two) times daily.         Marland Kitchen oxyCODONE-acetaminophen (PERCOCET/ROXICET) 5-325 MG per  tablet   Oral   Take 1-2 tablets by mouth every 4 (four) hours as needed for pain.         . potassium chloride (K-DUR,KLOR-CON) 10 MEQ tablet   Oral   Take 10 mEq by mouth daily.         . pravastatin (PRAVACHOL) 40 MG tablet   Oral   Take 40 mg by mouth every evening.         . traMADol (ULTRAM) 50 MG tablet   Oral   Take 50-100 mg by mouth 3 (three) times daily as needed for pain. For pain. Maximum dose= 8 tablets per day         . nitroGLYCERIN (NITROSTAT) 0.4 MG SL tablet   Sublingual   Place 1 tablet (0.4 mg total) under the tongue every 5 (five) minutes as needed for chest pain.   25 tablet   3    BP 108/52  Pulse 72  Temp(Src) 97.6 F (36.4 C) (Oral)  Resp 18  Ht 5\' 10"  (1.778 m)  Wt 230 lb (104.327 kg)  BMI 33 kg/m2  SpO2 91% Physical Exam  Nursing note and vitals reviewed. Constitutional: He appears well-developed and well-nourished. No distress.  HENT:  Head: Normocephalic and atraumatic.  Right Ear: External ear normal.  Left Ear: External ear normal.  Mouth/Throat: Oropharynx is clear and moist. No oropharyngeal exudate.  Patient has excoriation to left superior anterior nare, and adherent clot to left inferior anterior nare.  No active bleeding  Eyes: Conjunctivae and EOM are normal. Pupils are equal, round, and reactive to light.  Cardiovascular: Normal rate, regular rhythm, normal heart sounds and intact distal pulses.  Exam reveals no gallop and no friction rub.   No murmur heard. Pulmonary/Chest: Effort normal and breath sounds normal. No respiratory distress. He has no wheezes. He has no rales. He exhibits no tenderness.  Skin: He is not diaphoretic.    ED Course  Procedures (including critical care time) Labs Review Labs Reviewed - No data to display Imaging Review No results found.  EKG Interpretation   None       MDM   1. Anterior epistaxis    72 yo male with left sided nose bleed this evening.  No bleeding at present,  clot noted in anterior nose with dry cracked mucosa.  Will start on mupiricin ointment for infection/moisturization.  Given ENT number prn.    Olivia Mackie, MD 02/06/13 [phone]

## 2013-02-05 NOTE — ED Notes (Signed)
No further incidence of epistaxis noted while in the ED.

## 2013-02-06 ENCOUNTER — Other Ambulatory Visit (INDEPENDENT_AMBULATORY_CARE_PROVIDER_SITE_OTHER): Payer: Medicare Other

## 2013-02-06 DIAGNOSIS — I2510 Atherosclerotic heart disease of native coronary artery without angina pectoris: Secondary | ICD-10-CM

## 2013-02-06 LAB — HEPATIC FUNCTION PANEL
ALT: 20 U/L (ref 0–53)
AST: 16 U/L (ref 0–37)
Albumin: 3.8 g/dL (ref 3.5–5.2)
Alkaline Phosphatase: 64 U/L (ref 39–117)
Bilirubin, Direct: 0 mg/dL (ref 0.0–0.3)
Total Bilirubin: 0.4 mg/dL (ref 0.3–1.2)
Total Protein: 7.3 g/dL (ref 6.0–8.3)

## 2013-02-06 LAB — LIPID PANEL
Cholesterol: 164 mg/dL (ref 0–200)
HDL: 34.3 mg/dL — ABNORMAL LOW (ref >39.00)
LDL Cholesterol: 105 mg/dL — ABNORMAL HIGH (ref 0–99)
Total CHOL/HDL Ratio: 5
Triglycerides: 122 mg/dL (ref 0.0–149.0)
VLDL: 24.4 mg/dL (ref 0.0–40.0)

## 2013-02-13 ENCOUNTER — Other Ambulatory Visit: Payer: Self-pay

## 2013-02-13 MED ORDER — AMLODIPINE BESYLATE 5 MG PO TABS: 5.0000 mg | ORAL_TABLET | Freq: Every day | ORAL | Status: DC

## 2013-02-20 ENCOUNTER — Other Ambulatory Visit (HOSPITAL_COMMUNITY): Payer: Self-pay | Admitting: Physician Assistant

## 2013-02-28 ENCOUNTER — Ambulatory Visit: Payer: Medicare Other | Admitting: Neurosurgery

## 2013-05-18 ENCOUNTER — Ambulatory Visit: Payer: Medicare Other | Admitting: Cardiovascular Disease

## 2013-05-22 ENCOUNTER — Ambulatory Visit (INDEPENDENT_AMBULATORY_CARE_PROVIDER_SITE_OTHER): Payer: Medicare Other | Admitting: Cardiovascular Disease

## 2013-05-22 ENCOUNTER — Encounter: Payer: Self-pay | Admitting: Cardiovascular Disease

## 2013-05-22 VITALS — BP 148/84 | HR 76 | Ht 69.0 in | Wt 233.1 lb

## 2013-05-22 DIAGNOSIS — E785 Hyperlipidemia, unspecified: Secondary | ICD-10-CM

## 2013-05-22 DIAGNOSIS — I10 Essential (primary) hypertension: Secondary | ICD-10-CM

## 2013-05-22 DIAGNOSIS — I2510 Atherosclerotic heart disease of native coronary artery without angina pectoris: Secondary | ICD-10-CM

## 2013-05-22 MED ORDER — AMLODIPINE BESYLATE 10 MG PO TABS: 10.0000 mg | ORAL_TABLET | Freq: Every day | ORAL | Status: DC

## 2013-05-22 NOTE — Patient Instructions (Signed)
Your physician wants you to follow-up in: 6 months.  You will receive a reminder letter in the mail two months in advance. If you don't receive a letter, please call our office to schedule the follow-up appointment.  Your physician has recommended you make the following change in your medication:  Increase Norvasc to 10 mg by mouth daily

## 2013-05-22 NOTE — Progress Notes (Signed)
History of Present Illness: 73 yo male with history of HTN, HLD, CAD, PAD who is here today for cardiac follow up. He has been followed in the past by Dr. Lia Foyer. Last cath February 2014 with moderate LAD and RCA disease, severe OM2 stenosis with severe stenosis treated with bare metal stent. Admitted to [facility] 11/09/12 with chest pain. Ruled out for MI. Lexiscan stress myoview 11/16/12 without ischemia. Normal LVEF.   He is here today for follow up. Occasional chest pain that lasts for a few seconds. No SOB. BP at home has been 140-160. No recent chest pains lasting over a few seconds. No associated SOB or diaphoresis.   Primary Care Physician:  Seward Carol  Last Lipid Profile:Lipid Panel     Component Value Date/Time   CHOL 164 02/06/2013 0741   TRIG 122.0 02/06/2013 0741   HDL 34.30* 02/06/2013 0741   CHOLHDL 5 02/06/2013 0741   VLDL 24.4 02/06/2013 0741   LDLCALC 105* 02/06/2013 0741   Past Medical History  Diagnosis Date  . GERD (gastroesophageal reflux disease)   . HLD (hyperlipidemia)   . HTN (hypertension)   . Cough secondary to angiotensin converting enzyme inhibitor (ACE-I)   . Peripheral vascular disease   . CAD (coronary artery disease)     A. Negative MV 04/2010;  B. Inadequate Stress Echo 05/2011;  C. 05/29/11 Cath: - Med Rx;  D. 05/2012 Cath/PCI: LM nl, LAD 40p, LCX ectatic, OM1 180m, OM2 90p (3.5x16 Veriflex BMS), RCA dominant 40p/d, PDA 50p, EF 55-65%.  . Iron deficiency anemia   . DVT (deep venous thrombosis)   . Microcytic anemia   . COPD (chronic obstructive pulmonary disease)   . Angina   . Shortness of breath     "can come on at anytime" (11/09/2012)  . History of blood transfusion     "I've had 2; don't know what it was related to" (11/09/2012)  . Rotator cuff injury     "left arm; never repaired" (11/09/2012)  . Headache(784.0)     Hx: of  . Cancer     Hx: of prostate    Past Surgical History  Procedure Laterality Date  . Iliac artery aneurysm repair     . Circumcision    . Pr vein bypass graft,aorto-fem-pop Left 10/01/2009    left below knee popliteal artery to posterior tibial artery  . Cataract extraction w/ intraocular lens implant Right ~ 2008  . Coronary angioplasty with stent placement  2014    "1" (11/09/2012)  . Left heart cath  05-31-12  . Cardiovascular stress test  Aug. 2014  . Shoulder arthroscopy with open rotator cuff repair and distal clavicle acrominectomy Left 01/12/2013    Procedure: LEFT SHOULDER ARTHROSCOPY WITH DEBRIDEMENT OPEN DISTAL CLAVICLE RESECTION ,acromioplastyAND ROTATOR CUFF REPAIR;  Surgeon: Yvette Rack., MD;  Location: [facility];  Service: Orthopedics;  Laterality: Left;    Current Outpatient Prescriptions  Medication Sig Dispense Refill  . albuterol (PROVENTIL HFA;VENTOLIN HFA) 108 (90 BASE) MCG/ACT inhaler Inhale 2 puffs into the lungs 2 (two) times daily as needed for wheezing or shortness of breath.       Marland Kitchen amLODipine (NORVASC) 5 MG tablet Take 1 tablet (5 mg total) by mouth daily.  30 tablet  6  . aspirin EC 81 MG tablet Take 81 mg by mouth 2 (two) times daily.      Marland Kitchen losartan-hydrochlorothiazide (HYZAAR) 100-25 MG per tablet Take 1 tablet by mouth daily.       Marland Kitchen  nitroGLYCERIN (NITROSTAT) 0.4 MG SL tablet Place 1 tablet (0.4 mg total) under the tongue every 5 (five) minutes as needed for chest pain.  25 tablet  3  . omeprazole (PRILOSEC) 20 MG capsule Take 1 capsule (20 mg total) by mouth 2 (two) times daily.      Marland Kitchen oxyCODONE-acetaminophen (PERCOCET/ROXICET) 5-325 MG per tablet Take 1-2 tablets by mouth every 4 (four) hours as needed for pain.      . potassium chloride (K-DUR,KLOR-CON) 10 MEQ tablet take 1 tablet by mouth once daily  30 tablet  2  . pravastatin (PRAVACHOL) 40 MG tablet Take 40 mg by mouth every evening.      . traMADol (ULTRAM) 50 MG tablet Take 50-100 mg by mouth 3 (three) times daily as needed for pain. For pain. Maximum dose= 8 tablets per day       No current facility-administered  medications for this visit.    Allergies  Allergen Reactions  . Imdur [Isosorbide Mononitrate] Nausea Only and Other (See Comments)    Headache  . Lisinopril Other (See Comments)    Cough  . Topiramate     Shortness of breath and leg cramps    History   Social History  . Marital Status: Widowed    Spouse Name: N/A    Number of Children: N/A  . Years of Education: N/A   Occupational History  . Retired    Social History Main Topics  . Smoking status: Former Smoker -- 1.00 packs/day for 55 years    Types: Cigarettes    Quit date: 05/26/2011  . Smokeless tobacco: Never Used  . Alcohol Use: No  . Drug Use: No  . Sexual Activity: Not on file     Comment: 11/09/2012 "I don't have to answer questions about sex"   Other Topics Concern  . 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  Lives in [place] by himself.    Family History  Problem Relation Age of Onset  . Breast cancer Mother     deceased  . Cancer Mother   . Hyperlipidemia Mother   . Hypertension Mother   . COPD Father     deceased  . Cancer Father   . Hyperlipidemia Father   . Hypertension Father   . Hypertension Sister   . Cancer Brother   . Hypertension Brother   . Hypertension Daughter   . Hypertension Son     Review of Systems:  As stated in the HPI and otherwise negative.   BP 148/84  Pulse 76  Ht 5\' 9"  (1.753 m)  Wt 233 lb 1.9 oz (105.743 kg)  BMI 34.41 kg/m2  Physical Examination: General: Well developed, well nourished, NAD HEENT: OP clear, mucus membranes moist SKIN: warm, dry. No rashes. Neuro: No focal deficits Musculoskeletal: Muscle strength 5/5 all ext Psychiatric: Mood and affect normal Neck: No JVD, no carotid bruits, no thyromegaly, no lymphadenopathy. Lungs:Clear bilaterally, no wheezes, rhonci, crackles Cardiovascular: Regular rate and rhythm. No murmurs, gallops or rubs. Abdomen:Soft. Bowel sounds present. Non-tender.   Extremities: No lower extremity edema. Pulses are 2 + in the bilateral DP/PT.  Cardiac cath February 2014: Left mainstem: Normal  Left anterior descending (LAD): The LAD is mildly ectatic in the proximal vessel. There is a focal 40% lesion in the proximal LAD. The distal vessel has mild irregularities.  Left circumflex (LCx): The left circumflex gives rise to 2 marginal branches. The first marginal branch is  occluded in the mid vessel. The second marginal branch has a 90% stenosis proximally. The proximal and mid left circumflex is moderately ectatic.  Right coronary artery (RCA): The right coronary is a dominant vessel. It has a shepherd's crook deformity in the proximal vessel. The proximal vessel is diffusely diseased up to 40%. The mid vessel has severe ectasia. The distal vessel has diffuse 40% disease. The PDA is a small branch with 50% disease proximally.  Left ventriculography: Left ventricular systolic function is normal, LVEF is estimated at 55-65%, there is no significant mitral regurgitation  PCI Note: Following the diagnostic procedure, the decision was made to proceed with PCI of the second obtuse marginal vessel. The radial sheath was upsized to a 6 [place]. Plavix 600 mg was given orally. Weight-based bivalirudin was given for anticoagulation. Once a therapeutic ACT was achieved, a 6 [place] CLS 4.5 guide catheter was inserted. The lesion was very difficult to cross with a wire. There was a side branch that came off at the lesion and the lesion itself was a shelflike stenosis. We will unable to cross with a pro-water, Johnnye Lana XT, or whisper wire. A PT2 coronary guidewire was used to finally cross the lesion. The lesion was predilated with a 2.5 mm compliant balloon. The lesion was then stented with a 3.5 x 16 mm Veriflex stent. The stent was postdilated with a 3.75 mm noncompliant balloon. Following PCI, there was 0% residual stenosis and TIMI-3 flow. Final angiography confirmed an excellent  result. The patient tolerated the procedure well. There were no immediate procedural complications. A TR band was used for radial hemostasis. The patient was transferred to the post catheterization recovery area for further monitoring.   Assessment and Plan:   1. CAD: Stable. No ischemia on stress test 11/16/12 Continue current meds. Will not restart atenolol since he had bradycardia which has resolved off of this med.   2. HTN: BP is elevated. Will increase Norvasc to 10 mg daily  3. Hyperlipidemia: He has not been taking statin due to forgetting. Will restart Pravastatin 40 mg po QHS. LDL 105 in October 2014.

## 2013-06-14 ENCOUNTER — Other Ambulatory Visit: Payer: Self-pay | Admitting: Physician Assistant

## 2013-06-18 ENCOUNTER — Other Ambulatory Visit: Payer: Self-pay | Admitting: Physician Assistant

## 2013-06-25 ENCOUNTER — Other Ambulatory Visit: Payer: Self-pay | Admitting: Internal Medicine

## 2013-06-25 DIAGNOSIS — M545 Low back pain, unspecified: Secondary | ICD-10-CM

## 2013-07-02 ENCOUNTER — Ambulatory Visit
Admission: RE | Admit: 2013-07-02 | Discharge: 2013-07-02 | Disposition: A | Discharge status: Home / Self Care | Payer: Medicare Other | Source: Ambulatory Visit | Attending: Internal Medicine | Admitting: Internal Medicine

## 2013-07-02 DIAGNOSIS — M545 Low back pain, unspecified: Secondary | ICD-10-CM

## 2013-07-02 MED ORDER — GADOBENATE DIMEGLUMINE 529 MG/ML IV SOLN
20.0000 mL | Freq: Once | INTRAVENOUS | Status: AC | PRN
  Administered 2013-07-02: 20 mL via INTRAVENOUS

## 2013-07-18 ENCOUNTER — Emergency Department (HOSPITAL_COMMUNITY)
Admission: EM | Admit: 2013-07-18 | Discharge: 2013-07-18 | Disposition: A | Discharge status: Nursing Home (Includes ICF) | Payer: Medicare Other | Source: Home / Self Care

## 2013-07-18 ENCOUNTER — Encounter (HOSPITAL_COMMUNITY): Payer: Self-pay | Admitting: Emergency Medicine

## 2013-07-18 ENCOUNTER — Emergency Department (HOSPITAL_COMMUNITY)
Admission: EM | Admit: 2013-07-18 | Discharge: 2013-07-18 | Disposition: A | Discharge status: Home / Self Care | Payer: Medicare Other | Attending: Emergency Medicine | Admitting: Emergency Medicine

## 2013-07-18 ENCOUNTER — Emergency Department (HOSPITAL_COMMUNITY): Payer: Medicare Other

## 2013-07-18 DIAGNOSIS — Z9861 Coronary angioplasty status: Secondary | ICD-10-CM | POA: Insufficient documentation

## 2013-07-18 DIAGNOSIS — Z8546 Personal history of malignant neoplasm of prostate: Secondary | ICD-10-CM | POA: Insufficient documentation

## 2013-07-18 DIAGNOSIS — I2510 Atherosclerotic heart disease of native coronary artery without angina pectoris: Secondary | ICD-10-CM

## 2013-07-18 DIAGNOSIS — I10 Essential (primary) hypertension: Secondary | ICD-10-CM | POA: Insufficient documentation

## 2013-07-18 DIAGNOSIS — Z7982 Long term (current) use of aspirin: Secondary | ICD-10-CM | POA: Insufficient documentation

## 2013-07-18 DIAGNOSIS — J441 Chronic obstructive pulmonary disease with (acute) exacerbation: Secondary | ICD-10-CM | POA: Insufficient documentation

## 2013-07-18 DIAGNOSIS — J4489 Other specified chronic obstructive pulmonary disease: Secondary | ICD-10-CM | POA: Insufficient documentation

## 2013-07-18 DIAGNOSIS — Z87891 Personal history of nicotine dependence: Secondary | ICD-10-CM | POA: Insufficient documentation

## 2013-07-18 DIAGNOSIS — Z9889 Other specified postprocedural states: Secondary | ICD-10-CM | POA: Insufficient documentation

## 2013-07-18 DIAGNOSIS — K219 Gastro-esophageal reflux disease without esophagitis: Secondary | ICD-10-CM | POA: Insufficient documentation

## 2013-07-18 DIAGNOSIS — R079 Chest pain, unspecified: Secondary | ICD-10-CM

## 2013-07-18 DIAGNOSIS — E785 Hyperlipidemia, unspecified: Secondary | ICD-10-CM | POA: Insufficient documentation

## 2013-07-18 DIAGNOSIS — Z862 Personal history of diseases of the blood and blood-forming organs and certain disorders involving the immune mechanism: Secondary | ICD-10-CM | POA: Insufficient documentation

## 2013-07-18 DIAGNOSIS — Z87828 Personal history of other (healed) physical injury and trauma: Secondary | ICD-10-CM | POA: Insufficient documentation

## 2013-07-18 DIAGNOSIS — I209 Angina pectoris, unspecified: Secondary | ICD-10-CM | POA: Insufficient documentation

## 2013-07-18 DIAGNOSIS — Z79899 Other long term (current) drug therapy: Secondary | ICD-10-CM | POA: Insufficient documentation

## 2013-07-18 DIAGNOSIS — R072 Precordial pain: Secondary | ICD-10-CM | POA: Insufficient documentation

## 2013-07-18 DIAGNOSIS — J449 Chronic obstructive pulmonary disease, unspecified: Secondary | ICD-10-CM | POA: Insufficient documentation

## 2013-07-18 LAB — CBC
HCT: 36.5 % — ABNORMAL LOW (ref 39.0–52.0)
Hemoglobin: 11.9 g/dL — ABNORMAL LOW (ref 13.0–17.0)
MCH: 22.1 pg — ABNORMAL LOW (ref 26.0–34.0)
MCHC: 32.6 g/dL (ref 30.0–36.0)
MCV: 67.7 fL — ABNORMAL LOW (ref 78.0–100.0)
Platelets: 178 10*3/uL (ref 150–400)
RBC: 5.39 MIL/uL (ref 4.22–5.81)
RDW: 17.4 % — ABNORMAL HIGH (ref 11.5–15.5)
WBC: 5.4 10*3/uL (ref 4.0–10.5)

## 2013-07-18 LAB — BASIC METABOLIC PANEL
BUN: 14 mg/dL (ref 6–23)
CO2: 24 mEq/L (ref 19–32)
Calcium: 9.8 mg/dL (ref 8.4–10.5)
Chloride: 101 mEq/L (ref 96–112)
Creatinine, Ser: 1.09 mg/dL (ref 0.50–1.35)
GFR calc Af Amer: 76 mL/min — ABNORMAL LOW (ref >90)
GFR calc non Af Amer: 65 mL/min — ABNORMAL LOW (ref >90)
Glucose, Bld: 102 mg/dL — ABNORMAL HIGH (ref 70–99)
Potassium: 3.9 mEq/L (ref 3.7–5.3)
Sodium: 141 mEq/L (ref 137–147)

## 2013-07-18 LAB — TROPONIN I: Troponin I: <0.3 ng/mL (ref <0.30)

## 2013-07-18 LAB — I-STAT TROPONIN, ED: Troponin i, poc: 0.01 ng/mL (ref 0.00–0.08)

## 2013-07-18 MED ORDER — SODIUM CHLORIDE 0.9 % IV SOLN: Freq: Once | INTRAVENOUS | Status: DC

## 2013-07-18 NOTE — ED Provider Notes (Signed)
Medical screening examination/treatment/procedure(s) were performed by resident physician or non-physician practitioner and as supervising physician I was immediately available for consultation/collaboration.   Pauline Good MD.   Billy Fischer, MD 07/18/13 2014

## 2013-07-18 NOTE — ED Notes (Signed)
Pt c/o intermittent chest pain onset 3 days Today the pain was worse Denies HA, SOB, n/v Hx of CAD and DVT Talking in complete sentences w/no signs of acute distress.

## 2013-07-18 NOTE — Discharge Instructions (Signed)
Chest Pain (Nonspecific) °It is often hard to give a specific diagnosis for the cause of chest pain. There is always a chance that your pain could be related to something serious, such as a heart attack or a blood clot in the lungs. You need to follow up with your caregiver for further evaluation. °CAUSES  °· Heartburn. °· Pneumonia or bronchitis. °· Anxiety or stress. °· Inflammation around your heart (pericarditis) or lung (pleuritis or pleurisy). °· A blood clot in the lung. °· A collapsed lung (pneumothorax). It can develop suddenly on its own (spontaneous pneumothorax) or from injury (trauma) to the chest. °· Shingles infection (herpes zoster virus). °The chest wall is composed of bones, muscles, and cartilage. Any of these can be the source of the pain. °· The bones can be bruised by injury. °· The muscles or cartilage can be strained by coughing or overwork. °· The cartilage can be affected by inflammation and become sore (costochondritis). °DIAGNOSIS  °Lab tests or other studies, such as X-rays, electrocardiography, stress testing, or cardiac imaging, may be needed to find the cause of your pain.  °TREATMENT  °· Treatment depends on what may be causing your chest pain. Treatment may include: °· Acid blockers for heartburn. °· Anti-inflammatory medicine. °· Pain medicine for inflammatory conditions. °· Antibiotics if an infection is present. °· You may be advised to change lifestyle habits. This includes stopping smoking and avoiding alcohol, caffeine, and chocolate. °· You may be advised to keep your head raised (elevated) when sleeping. This reduces the chance of acid going backward from your stomach into your esophagus. °· Most of the time, nonspecific chest pain will improve within 2 to 3 days with rest and mild pain medicine. °HOME CARE INSTRUCTIONS  °· If antibiotics were prescribed, take your antibiotics as directed. Finish them even if you start to feel better. °· For the next few days, avoid physical  activities that bring on chest pain. Continue physical activities as directed. °· Do not smoke. °· Avoid drinking alcohol. °· Only take over-the-counter or prescription medicine for pain, discomfort, or fever as directed by your caregiver. °· Follow your caregiver's suggestions for further testing if your chest pain does not go away. °· Keep any follow-up appointments you made. If you do not go to an appointment, you could develop lasting (chronic) problems with pain. If there is any problem keeping an appointment, you must call to reschedule. °SEEK MEDICAL CARE IF:  °· You think you are having problems from the medicine you are taking. Read your medicine instructions carefully. °· Your chest pain does not go away, even after treatment. °· You develop a rash with blisters on your chest. °SEEK IMMEDIATE MEDICAL CARE IF:  °· You have increased chest pain or pain that spreads to your arm, neck, jaw, back, or abdomen. °· You develop shortness of breath, an increasing cough, or you are coughing up blood. °· You have severe back or abdominal pain, feel nauseous, or vomit. °· You develop severe weakness, fainting, or chills. °· You have a fever. °THIS IS AN EMERGENCY. Do not wait to see if the pain will go away. Get medical help at once. Call your local emergency services (911 in [place] Do not drive yourself to the hospital. °MAKE SURE YOU:  °· Understand these instructions. °· Will watch your condition. °· Will get help right away if you are not doing well or get worse. °Document Released: 01/06/2005 Document Revised: 06/21/2011 Document Reviewed: 11/02/2007 °Exit[employer]® Patient Information ©2014 [employer]   [employer]. ° °

## 2013-07-18 NOTE — ED Provider Notes (Signed)
CSN: [id number]     Arrival date & time 07/18/13  1510 History   First MD Initiated Contact with Patient 07/18/13 1559     Chief Complaint  Patient presents with  . Chest Pain   (Consider location/radiation/quality/duration/timing/severity/associated sxs/prior Treatment) HPI Comments: 73 y o m having chest pain for 2 d described as heaviness and tightness in the mid anterior chest. The pain was worse this PM and prompted his request to have [place] tell him what was causing the pain. Has varied in intensity, lasts 1-2 min, sometimes longer, occurs at rest most of the time.  Denies diaphoresis, dyspnea, V or V.  Multiple risk factors for angina. Known CAD, HTN, previous smoker, PVD, hx DVT, TIA and COPD.    Past Medical History  Diagnosis Date  . GERD (gastroesophageal reflux disease)   . HLD (hyperlipidemia)   . HTN (hypertension)   . Cough secondary to angiotensin converting enzyme inhibitor (ACE-I)   . Peripheral vascular disease   . CAD (coronary artery disease)     A. Negative MV 04/2010;  B. Inadequate Stress Echo 05/2011;  C. 05/29/11 Cath: - Med Rx;  D. 05/2012 Cath/PCI: LM nl, LAD 40p, LCX ectatic, OM1 149m, OM2 90p (3.5x16 Veriflex BMS), RCA dominant 40p/d, PDA 50p, EF 55-65%.  . Iron deficiency anemia   . DVT (deep venous thrombosis)   . Microcytic anemia   . COPD (chronic obstructive pulmonary disease)   . Angina   . Shortness of breath     "can come on at anytime" (11/09/2012)  . History of blood transfusion     "I've had 2; don't know what it was related to" (11/09/2012)  . Rotator cuff injury     "left arm; never repaired" (11/09/2012)  . Headache(784.0)     Hx: of  . Cancer     Hx: of prostate   Past Surgical History  Procedure Laterality Date  . Iliac artery aneurysm repair    . Circumcision    . Pr vein bypass graft,aorto-fem-pop Left 10/01/2009    left below knee popliteal artery to posterior tibial artery  . Cataract extraction w/ intraocular lens implant Right ~  2008  . Coronary angioplasty with stent placement  2014    "1" (11/09/2012)  . Left heart cath  05-31-12  . Cardiovascular stress test  Aug. 2014  . Shoulder arthroscopy with open rotator cuff repair and distal clavicle acrominectomy Left 01/12/2013    Procedure: LEFT SHOULDER ARTHROSCOPY WITH DEBRIDEMENT OPEN DISTAL CLAVICLE RESECTION ,acromioplastyAND ROTATOR CUFF REPAIR;  Surgeon: Yvette Rack., MD;  Location: [place];  Service: Orthopedics;  Laterality: Left;   Family History  Problem Relation Age of Onset  . Breast cancer Mother     deceased  . Cancer Mother   . Hyperlipidemia Mother   . Hypertension Mother   . COPD Father     deceased  . Cancer Father   . Hyperlipidemia Father   . Hypertension Father   . Hypertension Sister   . Cancer Brother   . Hypertension Brother   . Hypertension Daughter   . Hypertension Son    History  Substance Use Topics  . Smoking status: Former Smoker -- 1.00 packs/day for 55 years    Types: Cigarettes    Quit date: 05/26/2011  . Smokeless tobacco: Never Used  . Alcohol Use: No    Review of Systems  Constitutional: Positive for activity change. Negative for fever and fatigue.  HENT: Negative.   Respiratory: Positive  for chest tightness. Negative for cough and shortness of breath.   Cardiovascular: Positive for chest pain. Negative for palpitations and leg swelling.  Gastrointestinal: Negative.   Genitourinary: Negative.   Musculoskeletal: Negative.   Skin: Negative for color change and rash.  Neurological: Negative for dizziness, syncope and numbness.  Psychiatric/Behavioral: Negative.     Allergies  Imdur; Lisinopril; and Topiramate  Home Medications   Current Outpatient Rx  Name  Route  Sig  Dispense  Refill  . amLODipine (NORVASC) 10 MG tablet   Oral   Take 1 tablet (10 mg total) by mouth daily.   30 tablet   11   . aspirin EC 81 MG tablet   Oral   Take 81 mg by mouth 2 (two) times daily.         Marland Kitchen  losartan-hydrochlorothiazide (HYZAAR) 100-25 MG per tablet   Oral   Take 1 tablet by mouth daily.   30 tablet   4   . omeprazole (PRILOSEC) 20 MG capsule   Oral   Take 1 capsule (20 mg total) by mouth 2 (two) times daily.         . potassium chloride (K-DUR,KLOR-CON) 10 MEQ tablet      take 1 tablet by mouth once daily   30 tablet   2   . pravastatin (PRAVACHOL) 40 MG tablet   Oral   Take 40 mg by mouth every evening.         Marland Kitchen albuterol (PROVENTIL HFA;VENTOLIN HFA) 108 (90 BASE) MCG/ACT inhaler   Inhalation   Inhale 2 puffs into the lungs 2 (two) times daily as needed for wheezing or shortness of breath.          . nitroGLYCERIN (NITROSTAT) 0.4 MG SL tablet   Sublingual   Place 1 tablet (0.4 mg total) under the tongue every 5 (five) minutes as needed for chest pain.   25 tablet   3   . oxyCODONE-acetaminophen (PERCOCET/ROXICET) 5-325 MG per tablet   Oral   Take 1-2 tablets by mouth every 4 (four) hours as needed for pain.         . traMADol (ULTRAM) 50 MG tablet   Oral   Take 50-100 mg by mouth 3 (three) times daily as needed for pain. For pain. Maximum dose= 8 tablets per day          BP 133/75  Pulse 74  Temp(Src) 98.2 F (36.8 C) (Oral)  Resp 16  SpO2 97% Physical Exam  Nursing note and vitals reviewed. Constitutional: He is oriented to person, place, and time. He appears well-developed and well-nourished. No distress.  Eyes: Conjunctivae and EOM are normal.  Neck: Normal range of motion. Neck supple.  Cardiovascular: Normal rate, regular rhythm and normal heart sounds.   Pulmonary/Chest: Effort normal and breath sounds normal. No respiratory distress. He has no wheezes.  Abdominal: Soft. There is no tenderness.  Musculoskeletal: He exhibits no edema and no tenderness.  Lymphadenopathy:    He has no cervical adenopathy.  Neurological: He is alert and oriented to person, place, and time. He exhibits normal muscle tone.  Skin: Skin is dry.   Psychiatric: He has a normal mood and affect.    ED Course  Procedures (including critical care time) Labs Review Labs Reviewed - No data to display Imaging Review No results found.   MDM   1. Chest pain   2. CAD (coronary artery disease)     Transfer to [place] for eval of  chest pain. Has multiple risk factors for CAD/angina. Last episode of tightness and heaviness in the chest about 1 hr prior to arrival; being worse today than previous 2 days.  Pt stable and pain free at this time. IV and O2 ordered as well as preparations to send via EMS. Pt refused to go via EMS or IV but agreed to go by shuttle. Ordered to sign AMA in reference to a possible poor outcome or an adverse effect on the way to the ED.    Janne Napoleon, NP 07/18/13 1630

## 2013-07-18 NOTE — ED Notes (Addendum)
Pt Joel [name], EMT that he does not want to go via EMS/[facility] Pt signed AMA tx Shanon Brow, NP has been notified.

## 2013-07-18 NOTE — ED Notes (Signed)
Pt is [facility] transfer.  Pt with cardiac hx and c/o intermittent central CP x 3 days.  Pt states a "hard pain hit today".  Pt rates pain 3/10 described as nagging.

## 2013-07-18 NOTE — ED Provider Notes (Signed)
CSN: [id number]     Arrival date & time 07/18/13  1643 History   First MD Initiated Contact with Patient 07/18/13 1801     Chief Complaint  Patient presents with  . Chest Pain     (Consider location/radiation/quality/duration/timing/severity/associated sxs/prior Treatment) Patient is a 73 y.o. male presenting with chest pain. The history is provided by the patient.  Chest Pain  Patient here complaining of intermittent epigastric pain x3 days lasting for 1-2 minutes. Pain is not associated with dyspnea diaphoresis. No nausea vomiting. Does have a history of coronary artery disease and this is not the same. No fever or cough. No leg pain or swelling. He used antacids with relief. Pain is also worse with movement and characterized as pressure. Denies any syncope or near-syncope. Went to urgent care Center and was sent here for further evaluation. Past Medical History  Diagnosis Date  . GERD (gastroesophageal reflux disease)   . HLD (hyperlipidemia)   . HTN (hypertension)   . Cough secondary to angiotensin converting enzyme inhibitor (ACE-I)   . Peripheral vascular disease   . CAD (coronary artery disease)     A. Negative MV 04/2010;  B. Inadequate Stress Echo 05/2011;  C. 05/29/11 Cath: - Med Rx;  D. 05/2012 Cath/PCI: LM nl, LAD 40p, LCX ectatic, OM1 166m, OM2 90p (3.5x16 Veriflex BMS), RCA dominant 40p/d, PDA 50p, EF 55-65%.  . Iron deficiency anemia   . DVT (deep venous thrombosis)   . Microcytic anemia   . COPD (chronic obstructive pulmonary disease)   . Angina   . Shortness of breath     "can come on at anytime" (11/09/2012)  . History of blood transfusion     "I've had 2; don't know what it was related to" (11/09/2012)  . Rotator cuff injury     "left arm; never repaired" (11/09/2012)  . Headache(784.0)     Hx: of  . Cancer     Hx: of prostate   Past Surgical History  Procedure Laterality Date  . Iliac artery aneurysm repair    . Circumcision    . Pr vein bypass  graft,aorto-fem-pop Left 10/01/2009    left below knee popliteal artery to posterior tibial artery  . Cataract extraction w/ intraocular lens implant Right ~ 2008  . Coronary angioplasty with stent placement  2014    "1" (11/09/2012)  . Left heart cath  05-31-12  . Cardiovascular stress test  Aug. 2014  . Shoulder arthroscopy with open rotator cuff repair and distal clavicle acrominectomy Left 01/12/2013    Procedure: LEFT SHOULDER ARTHROSCOPY WITH DEBRIDEMENT OPEN DISTAL CLAVICLE RESECTION ,acromioplastyAND ROTATOR CUFF REPAIR;  Surgeon: Yvette Rack., MD;  Location: [facility];  Service: Orthopedics;  Laterality: Left;   Family History  Problem Relation Age of Onset  . Breast cancer Mother     deceased  . Cancer Mother   . Hyperlipidemia Mother   . Hypertension Mother   . COPD Father     deceased  . Cancer Father   . Hyperlipidemia Father   . Hypertension Father   . Hypertension Sister   . Cancer Brother   . Hypertension Brother   . Hypertension Daughter   . Hypertension Son    History  Substance Use Topics  . Smoking status: Former Smoker -- 1.00 packs/day for 55 years    Types: Cigarettes    Quit date: 05/26/2011  . Smokeless tobacco: Never Used  . Alcohol Use: No    Review of Systems  Cardiovascular:  Positive for chest pain.  All other systems reviewed and are negative.     Allergies  Imdur; Lisinopril; and Topiramate  Home Medications   Current Outpatient Rx  Name  Route  Sig  Dispense  Refill  . albuterol (PROVENTIL HFA;VENTOLIN HFA) 108 (90 BASE) MCG/ACT inhaler   Inhalation   Inhale 2 puffs into the lungs 2 (two) times daily as needed for wheezing or shortness of breath.          Marland Kitchen amLODipine (NORVASC) 10 MG tablet   Oral   Take 1 tablet (10 mg total) by mouth daily.   30 tablet   11   . aspirin EC 81 MG tablet   Oral   Take 81 mg by mouth 2 (two) times daily.         Marland Kitchen losartan-hydrochlorothiazide (HYZAAR) 100-25 MG per tablet   Oral   Take 1  tablet by mouth daily.   30 tablet   4   . omeprazole (PRILOSEC) 20 MG capsule   Oral   Take 1 capsule (20 mg total) by mouth 2 (two) times daily.         Marland Kitchen oxyCODONE-acetaminophen (PERCOCET/ROXICET) 5-325 MG per tablet   Oral   Take 1-2 tablets by mouth every 4 (four) hours as needed for pain.         . potassium chloride (K-DUR,KLOR-CON) 10 MEQ tablet      take 1 tablet by mouth once daily   30 tablet   2   . pravastatin (PRAVACHOL) 40 MG tablet   Oral   Take 40 mg by mouth every evening.         . traMADol (ULTRAM) 50 MG tablet   Oral   Take 50-100 mg by mouth 3 (three) times daily as needed for pain. For pain. Maximum dose= 8 tablets per day         . nitroGLYCERIN (NITROSTAT) 0.4 MG SL tablet   Sublingual   Place 1 tablet (0.4 mg total) under the tongue every 5 (five) minutes as needed for chest pain.   25 tablet   3    BP 138/72  Pulse 64  Temp(Src) 97.8 F (36.6 C) (Oral)  Resp 16  Wt 233 lb (105.688 kg)  SpO2 100% Physical Exam  Nursing note and vitals reviewed. Constitutional: He is oriented to person, place, and time. He appears well-developed and well-nourished.  Non-toxic appearance. No distress.  HENT:  Head: Normocephalic and atraumatic.  Eyes: Conjunctivae, EOM and lids are normal. Pupils are equal, round, and reactive to light.  Neck: Normal range of motion. Neck supple. No tracheal deviation present. No mass present.  Cardiovascular: Normal rate, regular rhythm and normal heart sounds.  Exam reveals no gallop.   No murmur heard. Pulmonary/Chest: Effort normal and breath sounds normal. No stridor. No respiratory distress. He has no decreased breath sounds. He has no wheezes. He has no rhonchi. He has no rales.  Abdominal: Soft. Normal appearance and bowel sounds are normal. He exhibits no distension. There is no tenderness. There is no rebound and no CVA tenderness.  Musculoskeletal: Normal range of motion. He exhibits no edema and no  tenderness.  Neurological: He is alert and oriented to person, place, and time. He has normal strength. No cranial nerve deficit or sensory deficit. GCS eye subscore is 4. GCS verbal subscore is 5. GCS motor subscore is 6.  Skin: Skin is warm and dry. No abrasion and no rash noted.  Psychiatric: He has  a normal mood and affect. His speech is normal and behavior is normal.    ED Course  Procedures (including critical care time) Labs Review Labs Reviewed  CBC - Abnormal; Notable for the following:    Hemoglobin 11.9 (*)    HCT 36.5 (*)    MCV 67.7 (*)    MCH 22.1 (*)    RDW 17.4 (*)    All other components within normal limits  BASIC METABOLIC PANEL  TROPONIN I  I-STAT TROPOININ, ED   Imaging Review Dg Chest 2 View  07/18/2013   CLINICAL DATA:  Substernal chest pain  EXAM: CHEST  2 VIEW  COMPARISON:  Prior radiograph from 10/12/2012  FINDINGS: The cardiac and mediastinal silhouettes are stable in size and contour, and remain within normal limits.  The lungs are normally inflated. No airspace consolidation, pleural effusion, or pulmonary edema is identified. There is no pneumothorax.  No acute osseous abnormality identified. Multilevel degenerative osteophyte seen within the thoracic spine.  IMPRESSION: No acute cardiopulmonary abnormality.   Electronically Signed   By: Jeannine Boga M.D.   On: 07/18/2013 18:06     EKG Interpretation   Date/Time:  Wednesday July 18 2013 16:49:59 EDT Ventricular Rate:  69 PR Interval:  212 QRS Duration: 88 QT Interval:  396 QTC Calculation: 424 R Axis:   56 Text Interpretation:  Sinus rhythm with 1st degree A-V block Otherwise  normal ECG No significant change since last tracing Confirmed by Vikki Gains   MD, Lareta Bruneau ([id number]) on 07/18/2013 6:03:42 PM      MDM   Final diagnoses:  None   Patient with negative delta troponin. Do not think that this presents ACS. Stable for discharge     Leota Jacobsen, MD 07/18/13 2243

## 2013-07-23 ENCOUNTER — Telehealth: Payer: Self-pay | Admitting: Cardiovascular Disease

## 2013-07-23 NOTE — Telephone Encounter (Signed)
New Prob   Pt states he is experiencing intermittent worsening pain in his chest x 3 days. He states he was seen in the ED prior for this same complaint last week. During visit he was told no abnormalities were found. He states similar pain has now returned. Appt scheduled with Dr. Angelena Form this coming Friday 4/17.

## 2013-07-23 NOTE — Telephone Encounter (Signed)
Received call from [facility]; patient reports he woke up this morning with a "funny feeling" and headache; states the pain then moved into his neck.  Patient states he would like to see Dr. Angelena Form today.  I advised patient that Dr. Angelena Form is not in the office this afternoon - patient has an appointment with Dr. Angelena Form on Friday 4/17.  Patient reports he went to the ER last week with similar complaints and feels like he needs to see Dr. Angelena Form.  I reviewed patient's chart and questioned whether this pain was the same he was experiencing on 4/8 when he went to the ER.  Patient states this pain is different, that it is a headache.  Patient denies chest pain, SOB, n/v, dizziness.  Patient reports his BP has been in the 683'M systolic, unsure of diastolic reading since a medication increase last month.  Patient reports he slept on his side and states the pain in his neck may be related to the way he slept.  I advised patient to keep appointment with Dr. Angelena Form and to call back with worsening symptoms or with cardiac symptoms.  Patient verbalized understanding and agreement and reports he has an appointment with PCP today.

## 2013-07-23 NOTE — Telephone Encounter (Signed)
Agree. cdm 

## 2013-07-27 ENCOUNTER — Encounter: Payer: Self-pay | Admitting: Cardiovascular Disease

## 2013-07-27 ENCOUNTER — Encounter: Payer: Self-pay | Admitting: *Deleted

## 2013-07-27 ENCOUNTER — Ambulatory Visit (INDEPENDENT_AMBULATORY_CARE_PROVIDER_SITE_OTHER): Payer: Medicare Other | Admitting: Cardiovascular Disease

## 2013-07-27 VITALS — BP 130/70 | HR 59 | Ht 70.0 in | Wt 241.0 lb

## 2013-07-27 DIAGNOSIS — I2510 Atherosclerotic heart disease of native coronary artery without angina pectoris: Secondary | ICD-10-CM

## 2013-07-27 DIAGNOSIS — I10 Essential (primary) hypertension: Secondary | ICD-10-CM

## 2013-07-27 DIAGNOSIS — R0789 Other chest pain: Secondary | ICD-10-CM

## 2013-07-27 DIAGNOSIS — E785 Hyperlipidemia, unspecified: Secondary | ICD-10-CM

## 2013-07-27 DIAGNOSIS — I200 Unstable angina: Secondary | ICD-10-CM

## 2013-07-27 LAB — BASIC METABOLIC PANEL
BUN: 11 mg/dL (ref 6–23)
CO2: 28 mEq/L (ref 19–32)
Calcium: 9.5 mg/dL (ref 8.4–10.5)
Chloride: 102 mEq/L (ref 96–112)
Creatinine, Ser: 1.1 mg/dL (ref 0.4–1.5)
GFR: 82.5 mL/min (ref >60.00)
Glucose, Bld: 120 mg/dL — ABNORMAL HIGH (ref 70–99)
Potassium: 3.4 mEq/L — ABNORMAL LOW (ref 3.5–5.1)
Sodium: 138 mEq/L (ref 135–145)

## 2013-07-27 LAB — PROTIME-INR
INR: 0.9 ratio (ref 0.8–1.0)
Prothrombin Time: 10.2 s (ref 10.2–12.4)

## 2013-07-27 LAB — CBC
HCT: 34.5 % — ABNORMAL LOW (ref 39.0–52.0)
Hemoglobin: 10.8 g/dL — ABNORMAL LOW (ref 13.0–17.0)
MCHC: 31.4 g/dL (ref 30.0–36.0)
MCV: 69.4 fl — ABNORMAL LOW (ref 78.0–100.0)
Platelets: 196 10*3/uL (ref 150.0–400.0)
RBC: 4.98 Mil/uL (ref 4.22–5.81)
RDW: 18.2 % — ABNORMAL HIGH (ref 11.5–14.6)
WBC: 4.3 10*3/uL — ABNORMAL LOW (ref 4.5–10.5)

## 2013-07-27 MED ORDER — ISOSORBIDE MONONITRATE ER 30 MG PO TB24: 30.0000 mg | ORAL_TABLET | Freq: Every day | ORAL | Status: DC

## 2013-07-27 NOTE — Progress Notes (Signed)
History of Present Illness: 73 yo male with history of HTN, HLD, CAD, PAD who is here today for cardiac follow up. He has been followed in the past by Dr. Lia Foyer. Last cath February 2014 with moderate LAD and RCA disease, severe OM2 stenosis treated with bare metal stent. Admitted to [facility] 11/09/12 with chest pain. Ruled out for MI. Lexiscan stress myoview 11/16/12 without ischemia. Normal LVEF. Seen in the ED 07/18/13 with chest pain, epigastric pain. Troponin was negative. This was not felt to be cardiac related and he was discharged home. His PAD is followed in [facility] by Dr. Scot Dock.   He is here today for follow up. He reports continued chest pain. This is left sided. It has occurred with exertion. Similar to prior angina. He has used NTG SL and this resolved the pain.   Primary Care Physician:  Seward Carol  Last Lipid Profile:Lipid Panel     Component Value Date/Time   CHOL 164 02/06/2013 0741   TRIG 122.0 02/06/2013 0741   HDL 34.30* 02/06/2013 0741   CHOLHDL 5 02/06/2013 0741   VLDL 24.4 02/06/2013 0741   LDLCALC 105* 02/06/2013 0741   Past Medical History  Diagnosis Date  . GERD (gastroesophageal reflux disease)   . HLD (hyperlipidemia)   . HTN (hypertension)   . Cough secondary to angiotensin converting enzyme inhibitor (ACE-I)   . Peripheral vascular disease   . CAD (coronary artery disease)     A. Negative MV 04/2010;  B. Inadequate Stress Echo 05/2011;  C. 05/29/11 Cath: - Med Rx;  D. 05/2012 Cath/PCI: LM nl, LAD 40p, LCX ectatic, OM1 154m, OM2 90p (3.5x16 Veriflex BMS), RCA dominant 40p/d, PDA 50p, EF 55-65%.  . Iron deficiency anemia   . DVT (deep venous thrombosis)   . Microcytic anemia   . COPD (chronic obstructive pulmonary disease)   . Angina   . Shortness of breath     "can come on at anytime" (11/09/2012)  . History of blood transfusion     "I've had 2; don't know what it was related to" (11/09/2012)  . Rotator cuff injury     "left arm; never repaired" (11/09/2012)    . Headache(784.0)     Hx: of  . Cancer     Hx: of prostate    Past Surgical History  Procedure Laterality Date  . Iliac artery aneurysm repair    . Circumcision    . Pr vein bypass graft,aorto-fem-pop Left 10/01/2009    left below knee popliteal artery to posterior tibial artery  . Cataract extraction w/ intraocular lens implant Right ~ 2008  . Coronary angioplasty with stent placement  2014    "1" (11/09/2012)  . Left heart cath  05-31-12  . Cardiovascular stress test  Aug. 2014  . Shoulder arthroscopy with open rotator cuff repair and distal clavicle acrominectomy Left 01/12/2013    Procedure: LEFT SHOULDER ARTHROSCOPY WITH DEBRIDEMENT OPEN DISTAL CLAVICLE RESECTION ,acromioplastyAND ROTATOR CUFF REPAIR;  Surgeon: Yvette Rack., MD;  Location: [facility];  Service: Orthopedics;  Laterality: Left;    Current Outpatient Prescriptions  Medication Sig Dispense Refill  . albuterol (PROVENTIL HFA;VENTOLIN HFA) 108 (90 BASE) MCG/ACT inhaler Inhale 2 puffs into the lungs 2 (two) times daily as needed for wheezing or shortness of breath.       Marland Kitchen amLODipine (NORVASC) 10 MG tablet Take 1 tablet (10 mg total) by mouth daily.  30 tablet  11  . aspirin EC 81 MG tablet Take 81  mg by mouth 2 (two) times daily.      Marland Kitchen HYDROcodone-acetaminophen (NORCO/VICODIN) 5-325 MG per tablet       . Iron TABS Take by mouth.      . losartan-hydrochlorothiazide (HYZAAR) 100-25 MG per tablet Take 1 tablet by mouth daily.  30 tablet  4  . nitroGLYCERIN (NITROSTAT) 0.4 MG SL tablet Place 1 tablet (0.4 mg total) under the tongue every 5 (five) minutes as needed for chest pain.  25 tablet  3  . omeprazole (PRILOSEC) 20 MG capsule Take 1 capsule (20 mg total) by mouth 2 (two) times daily.      . potassium chloride (K-DUR,KLOR-CON) 10 MEQ tablet take 1 tablet by mouth once daily  30 tablet  2  . pravastatin (PRAVACHOL) 40 MG tablet Take 40 mg by mouth every evening.      . traMADol (ULTRAM) 50 MG tablet Take 50-100 mg by  mouth 3 (three) times daily as needed for pain. For pain. Maximum dose= 8 tablets per day       No current facility-administered medications for this visit.    Allergies  Allergen Reactions  . Imdur [Isosorbide Mononitrate] Nausea Only and Other (See Comments)    Headache  . Lisinopril Other (See Comments)    Cough  . Topiramate     Shortness of breath and leg cramps    History   Social History  . Marital Status: Widowed    Spouse Name: N/A    Number of Children: N/A  . Years of Education: N/A   Occupational History  . Retired    Social History Main Topics  . Smoking status: Former Smoker -- 1.00 packs/day for 55 years    Types: Cigarettes    Quit date: 05/26/2011  . Smokeless tobacco: Never Used  . Alcohol Use: No  . Drug Use: No  . Sexual Activity: Not on file     Comment: 11/09/2012 "I don't have to answer questions about sex"   Other Topics Concern  . Not on file   Social History Narrative   Single. Retired. 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  Lives in [place] by himself.    Family History  Problem Relation Age of Onset  . Breast cancer Mother     deceased  . Cancer Mother   . Hyperlipidemia Mother   . Hypertension Mother   . COPD Father     deceased  . Cancer Father   . Hyperlipidemia Father   . Hypertension Father   . Hypertension Sister   . Cancer Brother   . Hypertension Brother   . Hypertension Daughter   . Hypertension Son     Review of Systems:  As stated in the HPI and otherwise negative.   BP 130/70  Pulse 59  Ht 5\' 10"  (1.778 m)  Wt 241 lb (109.317 kg)  BMI 34.58 kg/m2  Physical Examination: General: Well developed, well nourished, NAD HEENT: OP clear, mucus membranes moist SKIN: warm, dry. No rashes. Neuro: No focal deficits Musculoskeletal: Muscle strength 5/5 all ext Psychiatric: Mood and affect normal Neck: No JVD, no carotid bruits, no thyromegaly, no lymphadenopathy. Lungs:Clear bilaterally, no wheezes, rhonci,  crackles Cardiovascular: Regular rate and rhythm. No murmurs, gallops or rubs. Abdomen:Soft. Bowel sounds present. Non-tender.  Extremities: No lower extremity edema. Pulses are 2 + in the bilateral DP/PT.  Cardiac cath February 2014: Left mainstem: Normal  Left anterior descending (LAD): The LAD is mildly ectatic in the proximal vessel. There is a focal 40%  lesion in the proximal LAD. The distal vessel has mild irregularities.  Left circumflex (LCx): The left circumflex gives rise to 2 marginal branches. The first marginal branch is occluded in the mid vessel. The second marginal branch has a 90% stenosis proximally. The proximal and mid left circumflex is moderately ectatic.  Right coronary artery (RCA): The right coronary is a dominant vessel. It has a shepherd's crook deformity in the proximal vessel. The proximal vessel is diffusely diseased up to 40%. The mid vessel has severe ectasia. The distal vessel has diffuse 40% disease. The PDA is a small branch with 50% disease proximally.  Left ventriculography: Left ventricular systolic function is normal, LVEF is estimated at 55-65%, there is no significant mitral regurgitation  PCI Note: Following the diagnostic procedure, the decision was made to proceed with PCI of the second obtuse marginal vessel. The radial sheath was upsized to a 6 [place]. Plavix 600 mg was given orally. Weight-based bivalirudin was given for anticoagulation. Once a therapeutic ACT was achieved, a 6 [place] CLS 4.5 guide catheter was inserted. The lesion was very difficult to cross with a wire. There was a side branch that came off at the lesion and the lesion itself was a shelflike stenosis. We will unable to cross with a pro-water, Johnnye Lana XT, or whisper wire. A PT2 coronary guidewire was used to finally cross the lesion. The lesion was predilated with a 2.5 mm compliant balloon. The lesion was then stented with a 3.5 x 16 mm Veriflex stent. The stent was postdilated with a 3.75  mm noncompliant balloon. Following PCI, there was 0% residual stenosis and TIMI-3 flow. Final angiography confirmed an excellent result. The patient tolerated the procedure well. There were no immediate procedural complications. A TR band was used for radial hemostasis. The patient was transferred to the post catheterization recovery area for further monitoring.  EKG: Sinus brady, rate 59 bpm. 1st degree AV block. LVH   Assessment and Plan:   1. CAD: Recent chest pains similar to prior angina. Relieved with NTG. Known to have moderate disease in LAD and RCA and recent bare metal stent in OM2 in 2014. Will plan cath at [facility] next week. Pre-cath labs today. Risks and benefits reviewed.   2. HTN: BP is controlled.   3. Hyperlipidemia: Continue statin.   4. Unstable angina: See above. Plan cath.

## 2013-07-27 NOTE — Patient Instructions (Addendum)
Your physician recommends that you schedule a follow-up appointment in:  About 4 weeks.--Scheduled for Sep 04, 2013 at [facility] has recommended you make the following change in your medication:  Start Imdur 30 mg by mouth daily  Your physician has requested that you have a cardiac catheterization. Cardiac catheterization is used to diagnose and/or treat various heart conditions. Doctors may recommend this procedure for a number of different reasons. The most common reason is to evaluate chest pain. Chest pain can be a symptom of coronary artery disease (CAD), and cardiac catheterization can show whether plaque is narrowing or blocking your heart's arteries. This procedure is also used to evaluate the valves, as well as measure the blood flow and oxygen levels in different parts of your heart. For further information please visit [email or web address]. Please follow instruction sheet, as given. Scheduled for August 01, 2013

## 2013-07-30 ENCOUNTER — Telehealth: Payer: Self-pay | Admitting: Cardiovascular Disease

## 2013-07-30 ENCOUNTER — Encounter (HOSPITAL_COMMUNITY): Payer: Self-pay | Admitting: Pharmacy Technician

## 2013-07-30 NOTE — Telephone Encounter (Signed)
New problem    Pt calling about his medication he was prescribe last week 4/17. It causing him to have headaches and needs to speak to someone today.   Please give him a call back.

## 2013-07-30 NOTE — Telephone Encounter (Signed)
Pt.notified

## 2013-07-30 NOTE — Telephone Encounter (Signed)
I started Imdur. He can stop if he cannot tolerate. cdm

## 2013-08-01 ENCOUNTER — Ambulatory Visit (HOSPITAL_COMMUNITY)
Admission: RE | Admit: 2013-08-01 | Discharge: 2013-08-01 | Disposition: A | Discharge status: Home / Self Care | Payer: Medicare Other | Source: Ambulatory Visit | Attending: Cardiovascular Disease | Admitting: Cardiovascular Disease

## 2013-08-01 ENCOUNTER — Encounter (HOSPITAL_COMMUNITY)
Admission: RE | Disposition: A | Discharge status: Home / Self Care | Payer: Self-pay | Source: Ambulatory Visit | Attending: Cardiovascular Disease

## 2013-08-01 DIAGNOSIS — J4489 Other specified chronic obstructive pulmonary disease: Secondary | ICD-10-CM | POA: Insufficient documentation

## 2013-08-01 DIAGNOSIS — I200 Unstable angina: Secondary | ICD-10-CM | POA: Insufficient documentation

## 2013-08-01 DIAGNOSIS — I2510 Atherosclerotic heart disease of native coronary artery without angina pectoris: Secondary | ICD-10-CM

## 2013-08-01 DIAGNOSIS — J449 Chronic obstructive pulmonary disease, unspecified: Secondary | ICD-10-CM | POA: Insufficient documentation

## 2013-08-01 DIAGNOSIS — Z8546 Personal history of malignant neoplasm of prostate: Secondary | ICD-10-CM | POA: Insufficient documentation

## 2013-08-01 DIAGNOSIS — D509 Iron deficiency anemia, unspecified: Secondary | ICD-10-CM | POA: Insufficient documentation

## 2013-08-01 DIAGNOSIS — Z7982 Long term (current) use of aspirin: Secondary | ICD-10-CM | POA: Insufficient documentation

## 2013-08-01 DIAGNOSIS — I10 Essential (primary) hypertension: Secondary | ICD-10-CM | POA: Insufficient documentation

## 2013-08-01 DIAGNOSIS — Z86718 Personal history of other venous thrombosis and embolism: Secondary | ICD-10-CM | POA: Insufficient documentation

## 2013-08-01 DIAGNOSIS — Z9861 Coronary angioplasty status: Secondary | ICD-10-CM | POA: Insufficient documentation

## 2013-08-01 DIAGNOSIS — E785 Hyperlipidemia, unspecified: Secondary | ICD-10-CM | POA: Insufficient documentation

## 2013-08-01 DIAGNOSIS — K219 Gastro-esophageal reflux disease without esophagitis: Secondary | ICD-10-CM | POA: Insufficient documentation

## 2013-08-01 DIAGNOSIS — I739 Peripheral vascular disease, unspecified: Secondary | ICD-10-CM | POA: Insufficient documentation

## 2013-08-01 DIAGNOSIS — Z87891 Personal history of nicotine dependence: Secondary | ICD-10-CM | POA: Insufficient documentation

## 2013-08-01 HISTORY — PX: LEFT HEART CATHETERIZATION WITH CORONARY ANGIOGRAM: SHX5451

## 2013-08-01 LAB — POTASSIUM: Potassium: 4 mEq/L (ref 3.7–5.3)

## 2013-08-01 SURGERY — LEFT HEART CATHETERIZATION WITH CORONARY ANGIOGRAM
Anesthesia: LOCAL

## 2013-08-01 MED ORDER — MIDAZOLAM HCL 2 MG/2ML IJ SOLN
INTRAMUSCULAR | Status: AC
  Filled 2013-08-01: qty 2

## 2013-08-01 MED ORDER — ASPIRIN 81 MG PO CHEW: 81.0000 mg | CHEWABLE_TABLET | ORAL | Status: DC

## 2013-08-01 MED ORDER — HEPARIN SODIUM (PORCINE) 1000 UNIT/ML IJ SOLN
INTRAMUSCULAR | Status: AC
  Filled 2013-08-01: qty 1

## 2013-08-01 MED ORDER — SODIUM CHLORIDE 0.9 % IV SOLN
250.0000 mL | INTRAVENOUS | Status: DC | PRN
Start: 2013-08-01 — End: 2013-08-01

## 2013-08-01 MED ORDER — NITROGLYCERIN 0.2 MG/ML ON CALL CATH LAB
INTRAVENOUS | Status: AC
  Filled 2013-08-01: qty 1

## 2013-08-01 MED ORDER — LIDOCAINE HCL (PF) 1 % IJ SOLN
INTRAMUSCULAR | Status: AC
  Filled 2013-08-01: qty 30

## 2013-08-01 MED ORDER — DIAZEPAM 5 MG PO TABS
ORAL_TABLET | ORAL | Status: AC
  Filled 2013-08-01: qty 1

## 2013-08-01 MED ORDER — HEPARIN (PORCINE) IN NACL 2-0.9 UNIT/ML-% IJ SOLN
INTRAMUSCULAR | Status: AC
  Filled 2013-08-01: qty 1500

## 2013-08-01 MED ORDER — SODIUM CHLORIDE 0.9 % IV SOLN: INTRAVENOUS | Status: AC

## 2013-08-01 MED ORDER — DIAZEPAM 5 MG PO TABS
5.0000 mg | ORAL_TABLET | ORAL | Status: AC
  Administered 2013-08-01: 5 mg via ORAL

## 2013-08-01 MED ORDER — SODIUM CHLORIDE 0.9 % IJ SOLN: 3.0000 mL | Freq: Two times a day (BID) | INTRAMUSCULAR | Status: DC

## 2013-08-01 MED ORDER — SODIUM CHLORIDE 0.9 % IJ SOLN
3.0000 mL | INTRAMUSCULAR | Status: DC | PRN
  Administered 2013-08-01: 3 mL via INTRAVENOUS

## 2013-08-01 MED ORDER — VERAPAMIL HCL 2.5 MG/ML IV SOLN
INTRAVENOUS | Status: AC
  Filled 2013-08-01: qty 2

## 2013-08-01 MED ORDER — SODIUM CHLORIDE 0.9 % IV SOLN
INTRAVENOUS | Status: DC
  Administered 2013-08-01: 09:00:00 via INTRAVENOUS

## 2013-08-01 MED ORDER — FENTANYL CITRATE 0.05 MG/ML IJ SOLN
INTRAMUSCULAR | Status: AC
  Filled 2013-08-01: qty 2

## 2013-08-01 MED ORDER — ASPIRIN 81 MG PO CHEW
CHEWABLE_TABLET | ORAL | Status: AC
  Filled 2013-08-01: qty 1

## 2013-08-01 NOTE — Interval H&P Note (Signed)
History and Physical Interval Note:  08/01/2013 9:51 AM  Joel [name]  has presented today for cardiac cath with the diagnosis of CP  The various methods of treatment have been discussed with the patient and family. After consideration of risks, benefits and other options for treatment, the patient has consented to  Procedure(s): LEFT HEART CATHETERIZATION WITH CORONARY ANGIOGRAM (N/A) as a surgical intervention .  The patient's history has been reviewed, patient examined, no change in status, stable for surgery.  I have reviewed the patient's chart and labs.  Questions were answered to the patient's satisfaction.    Cath Lab Visit (complete for each Cath Lab visit)  Clinical Evaluation Leading to the Procedure:   ACS: no  Non-ACS:    Anginal Classification: CCS III  Anti-ischemic medical therapy: Minimal Therapy (1 class of medications)  Non-Invasive Test Results: No non-invasive testing performed  Prior CABG: No previous CABG    Burnell Blanks

## 2013-08-01 NOTE — CV Procedure (Signed)
      Cardiac Catheterization Operative Report  Joel [name] [id number] 4/22/201510:34 AM [name], MD  Procedure Performed:  1. Left Heart Catheterization 2. Selective Coronary Angiography 3. Left ventricular angiogram  Operator: Lauree Chandler, MD  Arterial access site:  Right radial artery.   Indication: 73 yo male with history of HTN, HLD, CAD, PAD who is here today for cardiac cath. He has been followed in the past by Dr. Lia Foyer. Last cath February 2014 with moderate LAD and RCA disease, severe OM2 stenosis treated with bare metal stent. Admitted to [facility] 11/09/12 with chest pain. Ruled out for MI. Lexiscan stress myoview 11/16/12 without ischemia. Normal LVEF. Seen in the ED 07/18/13 with chest pain, epigastric pain. Troponin was negative. This was not felt to be cardiac related and he was discharged home. Having recent chest pain concerning for unstable angina.                                       Procedure Details: The risks, benefits, complications, treatment options, and expected outcomes were discussed with the patient. The patient and/or family concurred with the proposed plan, giving informed consent. The patient was brought to the cath lab after IV hydration was begun and oral premedication was given. The patient was further sedated with Versed and Fentanyl. The right wrist was assessed with an Allens test which was positive. The right wrist was prepped and draped in a sterile fashion. 1% lidocaine was used for local anesthesia. Using the modified Seldinger access technique, a 5 French sheath was placed in the right radial artery. 3 mg Verapamil was given through the sheath. 5000 units IV heparin was given. Standard diagnostic catheters were used to perform selective coronary angiography. A pigtail catheter was used to perform a left ventricular angiogram. The sheath was removed from the right radial artery and a Terumo hemostasis band was applied at the arteriotomy  site on the right wrist.    There were no immediate complications. The patient was taken to the recovery area in stable condition.   Hemodynamic Findings: Central aortic pressure: 132/63 Left ventricular pressure: 136/4/9  Angiographic Findings:  Left main: No obstructive disease.   Left Anterior Descending Artery: Large caliber vessel that courses to the apex. The proximal and mid vessel is ectatic. There is diffuse 30-40% stenosis in the mid vessel. There are mild luminal irregularities in the distal vessel.   Circumflex Artery: Large caliber vessel with two obtuse marginal branches. The first OM branch is small in caliber and is chronically occluded in the mid vessel. The second OM branch is patent with patent stent, minimal restenosis. The distal segment of the OM branch has diffuse plaque.   Right Coronary Artery: Large caliber dominant vessel with proximal Shepherd's crook. The proximal vessel has diffuse 40% stenosis. The mid vessel is ectatic with 40% stenosis. The distal vessel has diffuse 50% stenosis. The PDA has 50% proximal stenosis followed by diffuse plaque.   Left Ventricular Angiogram: LVEF=65%.   Impression: 1. Triple vessel CAD with patent stent in the second OM branch.  2. Moderate non-obstructive disease in the LAD and RCA 3. Normal LV function  Recommendations: Continue medical management of CAD.        Complications:  None. The patient tolerated the procedure well.

## 2013-08-01 NOTE — Discharge Instructions (Signed)

## 2013-08-01 NOTE — H&P (View-Only) (Signed)
  History of Present Illness: 72 yo male with history of HTN, HLD, CAD, PAD who is here today for cardiac follow up. He has been followed in the past by Dr. Stuckey. Last cath February 2014 with moderate LAD and RCA disease, severe OM2 stenosis treated with bare metal stent. Admitted to [facility] 11/09/12 with chest pain. Ruled out for MI. Lexiscan stress myoview 11/16/12 without ischemia. Normal LVEF. Seen in the ED 07/18/13 with chest pain, epigastric pain. Troponin was negative. This was not felt to be cardiac related and he was discharged home. His PAD is followed in [facility] by Dr. Dickson.   He is here today for follow up. He reports continued chest pain. This is left sided. It has occurred with exertion. Similar to prior angina. He has used NTG SL and this resolved the pain.   Primary Care Physician:  Ronald Polite  Last Lipid Profile:Lipid Panel     Component Value Date/Time   CHOL 164 02/06/2013 0741   TRIG 122.0 02/06/2013 0741   HDL 34.30* 02/06/2013 0741   CHOLHDL 5 02/06/2013 0741   VLDL 24.4 02/06/2013 0741   LDLCALC 105* 02/06/2013 0741   Past Medical History  Diagnosis Date  . GERD (gastroesophageal reflux disease)   . HLD (hyperlipidemia)   . HTN (hypertension)   . Cough secondary to angiotensin converting enzyme inhibitor (ACE-I)   . Peripheral vascular disease   . CAD (coronary artery disease)     A. Negative MV 04/2010;  B. Inadequate Stress Echo 05/2011;  C. 05/29/11 Cath: - Med Rx;  D. 05/2012 Cath/PCI: LM nl, LAD 40p, LCX ectatic, OM1 100m, OM2 90p (3.5x16 Veriflex BMS), RCA dominant 40p/d, PDA 50p, EF 55-65%.  . Iron deficiency anemia   . DVT (deep venous thrombosis)   . Microcytic anemia   . COPD (chronic obstructive pulmonary disease)   . Angina   . Shortness of breath     "can come on at anytime" (11/09/2012)  . History of blood transfusion     "I've had 2; don't know what it was related to" (11/09/2012)  . Rotator cuff injury     "left arm; never repaired" (11/09/2012)    . Headache(784.0)     Hx: of  . Cancer     Hx: of prostate    Past Surgical History  Procedure Laterality Date  . Iliac artery aneurysm repair    . Circumcision    . Pr vein bypass graft,aorto-fem-pop Left 10/01/2009    left below knee popliteal artery to posterior tibial artery  . Cataract extraction w/ intraocular lens implant Right ~ 2008  . Coronary angioplasty with stent placement  2014    "1" (11/09/2012)  . Left heart cath  05-31-12  . Cardiovascular stress test  Aug. 2014  . Shoulder arthroscopy with open rotator cuff repair and distal clavicle acrominectomy Left 01/12/2013    Procedure: LEFT SHOULDER ARTHROSCOPY WITH DEBRIDEMENT OPEN DISTAL CLAVICLE RESECTION ,acromioplastyAND ROTATOR CUFF REPAIR;  Surgeon: W D Caffrey Jr., MD;  Location: [facility];  Service: Orthopedics;  Laterality: Left;    Current Outpatient Prescriptions  Medication Sig Dispense Refill  . albuterol (PROVENTIL HFA;VENTOLIN HFA) 108 (90 BASE) MCG/ACT inhaler Inhale 2 puffs into the lungs 2 (two) times daily as needed for wheezing or shortness of breath.       . amLODipine (NORVASC) 10 MG tablet Take 1 tablet (10 mg total) by mouth daily.  30 tablet  11  . aspirin EC 81 MG tablet Take 81   mg by mouth 2 (two) times daily.      . HYDROcodone-acetaminophen (NORCO/VICODIN) 5-325 MG per tablet       . Iron TABS Take by mouth.      . losartan-hydrochlorothiazide (HYZAAR) 100-25 MG per tablet Take 1 tablet by mouth daily.  30 tablet  4  . nitroGLYCERIN (NITROSTAT) 0.4 MG SL tablet Place 1 tablet (0.4 mg total) under the tongue every 5 (five) minutes as needed for chest pain.  25 tablet  3  . omeprazole (PRILOSEC) 20 MG capsule Take 1 capsule (20 mg total) by mouth 2 (two) times daily.      . potassium chloride (K-DUR,KLOR-CON) 10 MEQ tablet take 1 tablet by mouth once daily  30 tablet  2  . pravastatin (PRAVACHOL) 40 MG tablet Take 40 mg by mouth every evening.      . traMADol (ULTRAM) 50 MG tablet Take 50-100 mg by  mouth 3 (three) times daily as needed for pain. For pain. Maximum dose= 8 tablets per day       No current facility-administered medications for this visit.    Allergies  Allergen Reactions  . Imdur [Isosorbide Mononitrate] Nausea Only and Other (See Comments)    Headache  . Lisinopril Other (See Comments)    Cough  . Topiramate     Shortness of breath and leg cramps    History   Social History  . Marital Status: Widowed    Spouse Name: N/A    Number of Children: N/A  . Years of Education: N/A   Occupational History  . Retired    Social History Main Topics  . Smoking status: Former Smoker -- 1.00 packs/day for 55 years    Types: Cigarettes    Quit date: 05/26/2011  . Smokeless tobacco: Never Used  . Alcohol Use: No  . Drug Use: No  . Sexual Activity: Not on file     Comment: 11/09/2012 "I don't have to answer questions about sex"   Other Topics Concern  . 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  Lives in [place] by himself.    Family History  Problem Relation Age of Onset  . Breast cancer Mother     deceased  . Cancer Mother   . Hyperlipidemia Mother   . Hypertension Mother   . COPD Father     deceased  . Cancer Father   . Hyperlipidemia Father   . Hypertension Father   . Hypertension Sister   . Cancer Brother   . Hypertension Brother   . Hypertension Daughter   . Hypertension Son     Review of Systems:  As stated in the HPI and otherwise negative.   BP 130/70  Pulse 59  Ht 5' 10" (1.778 m)  Wt 241 lb (109.317 kg)  BMI 34.58 kg/m2  Physical Examination: General: Well developed, well nourished, NAD HEENT: OP clear, mucus membranes moist SKIN: warm, dry. No rashes. Neuro: No focal deficits Musculoskeletal: Muscle strength 5/5 all ext Psychiatric: Mood and affect normal Neck: No JVD, no carotid bruits, no thyromegaly, no lymphadenopathy. Lungs:Clear bilaterally, no wheezes, rhonci,  crackles Cardiovascular: Regular rate and rhythm. No murmurs, gallops or rubs. Abdomen:Soft. Bowel sounds present. Non-tender.  Extremities: No lower extremity edema. Pulses are 2 + in the bilateral DP/PT.  Cardiac cath February 2014: Left mainstem: Normal  Left anterior descending (LAD): The LAD is mildly ectatic in the proximal vessel. There is a focal 40%   lesion in the proximal LAD. The distal vessel has mild irregularities.  Left circumflex (LCx): The left circumflex gives rise to 2 marginal branches. The first marginal branch is occluded in the mid vessel. The second marginal branch has a 90% stenosis proximally. The proximal and mid left circumflex is moderately ectatic.  Right coronary artery (RCA): The right coronary is a dominant vessel. It has a shepherd's crook deformity in the proximal vessel. The proximal vessel is diffusely diseased up to 40%. The mid vessel has severe ectasia. The distal vessel has diffuse 40% disease. The PDA is a small branch with 50% disease proximally.  Left ventriculography: Left ventricular systolic function is normal, LVEF is estimated at 55-65%, there is no significant mitral regurgitation  PCI Note: Following the diagnostic procedure, the decision was made to proceed with PCI of the second obtuse marginal vessel. The radial sheath was upsized to a 6 [place]. Plavix 600 mg was given orally. Weight-based bivalirudin was given for anticoagulation. Once a therapeutic ACT was achieved, a 6 [place] CLS 4.5 guide catheter was inserted. The lesion was very difficult to cross with a wire. There was a side branch that came off at the lesion and the lesion itself was a shelflike stenosis. We will unable to cross with a pro-water, Johnnye Lana XT, or whisper wire. A PT2 coronary guidewire was used to finally cross the lesion. The lesion was predilated with a 2.5 mm compliant balloon. The lesion was then stented with a 3.5 x 16 mm Veriflex stent. The stent was postdilated with a 3.75  mm noncompliant balloon. Following PCI, there was 0% residual stenosis and TIMI-3 flow. Final angiography confirmed an excellent result. The patient tolerated the procedure well. There were no immediate procedural complications. A TR band was used for radial hemostasis. The patient was transferred to the post catheterization recovery area for further monitoring.  EKG: Sinus brady, rate 59 bpm. 1st degree AV block. LVH   Assessment and Plan:   1. CAD: Recent chest pains similar to prior angina. Relieved with NTG. Known to have moderate disease in LAD and RCA and recent bare metal stent in OM2 in 2014. Will plan cath at [facility] next week. Pre-cath labs today. Risks and benefits reviewed.   2. HTN: BP is controlled.   3. Hyperlipidemia: Continue statin.   4. Unstable angina: See above. Plan cath.

## 2013-08-01 NOTE — Progress Notes (Addendum)
LEFT WRIST NO BLEEDING OR HEMATOMA

## 2013-08-02 ENCOUNTER — Other Ambulatory Visit: Payer: Self-pay | Admitting: *Deleted

## 2013-08-02 MED ORDER — POTASSIUM CHLORIDE CRYS ER 10 MEQ PO TBCR: EXTENDED_RELEASE_TABLET | ORAL | Status: DC

## 2013-09-04 ENCOUNTER — Ambulatory Visit (INDEPENDENT_AMBULATORY_CARE_PROVIDER_SITE_OTHER): Payer: Medicare Other | Admitting: Cardiovascular Disease

## 2013-09-04 ENCOUNTER — Encounter: Payer: Self-pay | Admitting: Cardiovascular Disease

## 2013-09-04 VITALS — BP 160/80 | HR 70 | Ht 70.0 in | Wt 239.0 lb

## 2013-09-04 DIAGNOSIS — I2510 Atherosclerotic heart disease of native coronary artery without angina pectoris: Secondary | ICD-10-CM

## 2013-09-04 DIAGNOSIS — I10 Essential (primary) hypertension: Secondary | ICD-10-CM

## 2013-09-04 DIAGNOSIS — E785 Hyperlipidemia, unspecified: Secondary | ICD-10-CM

## 2013-09-04 NOTE — Patient Instructions (Signed)
Your physician wants you to follow-up in:  6 months. You will receive a reminder letter in the mail two months in advance. If you don't receive a letter, please call our office to schedule the follow-up appointment.   

## 2013-09-04 NOTE — Progress Notes (Signed)
History of Present Illness: 73 yo male with history of HTN, HLD, CAD, PAD who is here today for cardiac follow up. He has been followed in the past by Dr. Lia Foyer. Cardiac cath February 2014 with moderate LAD and RCA disease, severe OM2 stenosis treated with bare metal stent. Admitted to [facility] 11/09/12 with chest pain. Ruled out for MI. Lexiscan stress myoview 11/16/12 without ischemia. Normal LVEF.  I saw him in the office 07/27/13 and he c/o exertional chest pain c/w unstable angina. Cardiac cath 08/01/13 with patent stent Circumflex and mild to moderate disease in the LAD and RCA. His PAD is followed in [facility] by Dr. Scot Dock.  He is here today for follow up. NO chest pain or SOB.   Primary Care Physician:  Seward Carol  Last Lipid Profile:Lipid Panel     Component Value Date/Time   CHOL 164 02/06/2013 0741   TRIG 122.0 02/06/2013 0741   HDL 34.30* 02/06/2013 0741   CHOLHDL 5 02/06/2013 0741   VLDL 24.4 02/06/2013 0741   LDLCALC 105* 02/06/2013 0741   Past Medical History  Diagnosis Date  . GERD (gastroesophageal reflux disease)   . HLD (hyperlipidemia)   . HTN (hypertension)   . Cough secondary to angiotensin converting enzyme inhibitor (ACE-I)   . Peripheral vascular disease   . CAD (coronary artery disease)     A. Negative MV 04/2010;  B. Inadequate Stress Echo 05/2011;  C. 05/29/11 Cath: - Med Rx;  D. 05/2012 Cath/PCI: LM nl, LAD 40p, LCX ectatic, OM1 140m, OM2 90p (3.5x16 Veriflex BMS), RCA dominant 40p/d, PDA 50p, EF 55-65%.  . Iron deficiency anemia   . DVT (deep venous thrombosis)   . Microcytic anemia   . COPD (chronic obstructive pulmonary disease)   . Angina   . Shortness of breath     "can come on at anytime" (11/09/2012)  . History of blood transfusion     "I've had 2; don't know what it was related to" (11/09/2012)  . Rotator cuff injury     "left arm; never repaired" (11/09/2012)  . Headache(784.0)     Hx: of  . Cancer     Hx: of prostate    Past Surgical History    Procedure Laterality Date  . Iliac artery aneurysm repair    . Circumcision    . Pr vein bypass graft,aorto-fem-pop Left 10/01/2009    left below knee popliteal artery to posterior tibial artery  . Cataract extraction w/ intraocular lens implant Right ~ 2008  . Coronary angioplasty with stent placement  2014    "1" (11/09/2012)  . Left heart cath  05-31-12  . Cardiovascular stress test  Aug. 2014  . Shoulder arthroscopy with open rotator cuff repair and distal clavicle acrominectomy Left 01/12/2013    Procedure: LEFT SHOULDER ARTHROSCOPY WITH DEBRIDEMENT OPEN DISTAL CLAVICLE RESECTION ,acromioplastyAND ROTATOR CUFF REPAIR;  Surgeon: Yvette Rack., MD;  Location: [facility];  Service: Orthopedics;  Laterality: Left;    Current Outpatient Prescriptions  Medication Sig Dispense Refill  . Acetaminophen (TYLENOL PO) Take 1 tablet by mouth every 6 (six) hours as needed (headache).      Marland Kitchen albuterol (PROVENTIL HFA;VENTOLIN HFA) 108 (90 BASE) MCG/ACT inhaler Inhale 2 puffs into the lungs 2 (two) times daily as needed for wheezing or shortness of breath.       Marland Kitchen amLODipine (NORVASC) 10 MG tablet Take 1 tablet (10 mg total) by mouth daily.  30 tablet  11  . aspirin  EC 81 MG tablet Take 81 mg by mouth 2 (two) times daily.      . Iron TABS Take 1 tablet by mouth daily.       . isosorbide mononitrate (IMDUR) 30 MG 24 hr tablet       . losartan-hydrochlorothiazide (HYZAAR) 100-25 MG per tablet Take 1 tablet by mouth daily.  30 tablet  4  . nitroGLYCERIN (NITROSTAT) 0.4 MG SL tablet Place 1 tablet (0.4 mg total) under the tongue every 5 (five) minutes as needed for chest pain.  25 tablet  3  . omeprazole (PRILOSEC) 20 MG capsule Take 1 capsule (20 mg total) by mouth 2 (two) times daily.      . potassium chloride (K-DUR,KLOR-CON) 10 MEQ tablet take 1 tablet by mouth once daily  30 tablet  1  . pravastatin (PRAVACHOL) 40 MG tablet Take 40 mg by mouth every evening.      . traMADol (ULTRAM) 50 MG tablet Take  50-100 mg by mouth 3 (three) times daily as needed for pain. For pain. Maximum dose= 8 tablets per day       No current facility-administered medications for this visit.    Allergies  Allergen Reactions  . Imdur [Isosorbide Mononitrate] Nausea Only and Other (See Comments)    Headache  . Lisinopril Other (See Comments)    Cough  . Topiramate     Shortness of breath and leg cramps    History   Social History  . Marital Status: Widowed    Spouse Name: N/A    Number of Children: N/A  . Years of Education: N/A   Occupational History  . Retired    Social History Main Topics  . Smoking status: Former Smoker -- 1.00 packs/day for 55 years    Types: Cigarettes    Quit date: 05/26/2011  . Smokeless tobacco: Never Used  . Alcohol Use: No  . Drug Use: No  . Sexual Activity: Not on file     Comment: 11/09/2012 "I don't have to answer questions about sex"   Other Topics Concern  . Not on file   Social History Narrative   Single. Retired. 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  Lives in [place] by himself.    Family History  Problem Relation Age of Onset  . Breast cancer Mother     deceased  . Cancer Mother   . Hyperlipidemia Mother   . Hypertension Mother   . COPD Father     deceased  . Cancer Father   . Hyperlipidemia Father   . Hypertension Father   . Hypertension Sister   . Cancer Brother   . Hypertension Brother   . Hypertension Daughter   . Hypertension Son     Review of Systems:  As stated in the HPI and otherwise negative.   BP 160/80  Pulse 70  Ht 5\' 10"  (1.778 m)  Wt 239 lb (108.41 kg)  BMI 34.29 kg/m2  Physical Examination: General: Well developed, well nourished, NAD HEENT: OP clear, mucus membranes moist SKIN: warm, dry. No rashes. Neuro: No focal deficits Musculoskeletal: Muscle strength 5/5 all ext Psychiatric: Mood and affect normal Neck: No JVD, no carotid bruits, no thyromegaly, no lymphadenopathy. Lungs:Clear bilaterally, no wheezes, rhonci,  crackles Cardiovascular: Regular rate and rhythm. No murmurs, gallops or rubs. Abdomen:Soft. Bowel sounds present. Non-tender.  Extremities: No lower extremity edema. Pulses are 2 + in the bilateral DP/PT.  Cardiac cath 08/01/13: Left main: No obstructive disease.  Left Anterior Descending Artery: Large  caliber vessel that courses to the apex. The proximal and mid vessel is ectatic. There is diffuse 30-40% stenosis in the mid vessel. There are mild luminal irregularities in the distal vessel.  Circumflex Artery: Large caliber vessel with two obtuse marginal branches. The first OM branch is small in caliber and is chronically occluded in the mid vessel. The second OM branch is patent with patent stent, minimal restenosis. The distal segment of the OM branch has diffuse plaque.  Right Coronary Artery: Large caliber dominant vessel with proximal Shepherd's crook. The proximal vessel has diffuse 40% stenosis. The mid vessel is ectatic with 40% stenosis. The distal vessel has diffuse 50% stenosis. The PDA has 50% proximal stenosis followed by diffuse plaque.  Left Ventricular Angiogram: LVEF=65%.   Assessment and Plan:   1. CAD: Stable. No changes today.   2. HTN: BP is elevated today. He has not taken am meds yet.   3. Hyperlipidemia: Continue statin.

## 2013-11-13 ENCOUNTER — Other Ambulatory Visit: Payer: Self-pay

## 2013-11-13 MED ORDER — LOSARTAN POTASSIUM-HCTZ 100-25 MG PO TABS: 1.0000 | ORAL_TABLET | Freq: Every day | ORAL | Status: DC

## 2013-11-15 ENCOUNTER — Other Ambulatory Visit: Payer: Self-pay | Admitting: Internal Medicine

## 2013-11-15 ENCOUNTER — Ambulatory Visit
Admission: RE | Admit: 2013-11-15 | Discharge: 2013-11-15 | Disposition: A | Discharge status: Home / Self Care | Payer: Medicare Other | Source: Ambulatory Visit | Attending: Internal Medicine | Admitting: Internal Medicine

## 2013-11-15 DIAGNOSIS — R062 Wheezing: Secondary | ICD-10-CM

## 2013-11-20 ENCOUNTER — Encounter: Payer: Self-pay | Admitting: Vascular Surgery

## 2013-11-21 ENCOUNTER — Other Ambulatory Visit (HOSPITAL_COMMUNITY): Payer: Medicare Other

## 2013-11-21 ENCOUNTER — Encounter (HOSPITAL_COMMUNITY): Payer: Medicare Other

## 2013-11-21 ENCOUNTER — Ambulatory Visit: Payer: Medicare Other | Admitting: Vascular Surgery

## 2013-12-15 ENCOUNTER — Emergency Department (HOSPITAL_COMMUNITY): Payer: Medicare Other

## 2013-12-15 ENCOUNTER — Emergency Department (HOSPITAL_COMMUNITY)
Admission: EM | Admit: 2013-12-15 | Discharge: 2013-12-15 | Disposition: A | Discharge status: Home / Self Care | Payer: Medicare Other | Attending: Emergency Medicine | Admitting: Emergency Medicine

## 2013-12-15 ENCOUNTER — Encounter (HOSPITAL_COMMUNITY): Payer: Self-pay | Admitting: Emergency Medicine

## 2013-12-15 DIAGNOSIS — F17200 Nicotine dependence, unspecified, uncomplicated: Secondary | ICD-10-CM | POA: Insufficient documentation

## 2013-12-15 DIAGNOSIS — K219 Gastro-esophageal reflux disease without esophagitis: Secondary | ICD-10-CM | POA: Insufficient documentation

## 2013-12-15 DIAGNOSIS — D509 Iron deficiency anemia, unspecified: Secondary | ICD-10-CM | POA: Diagnosis not present

## 2013-12-15 DIAGNOSIS — Z9861 Coronary angioplasty status: Secondary | ICD-10-CM | POA: Insufficient documentation

## 2013-12-15 DIAGNOSIS — J449 Chronic obstructive pulmonary disease, unspecified: Secondary | ICD-10-CM | POA: Diagnosis not present

## 2013-12-15 DIAGNOSIS — I209 Angina pectoris, unspecified: Secondary | ICD-10-CM | POA: Insufficient documentation

## 2013-12-15 DIAGNOSIS — I2510 Atherosclerotic heart disease of native coronary artery without angina pectoris: Secondary | ICD-10-CM | POA: Insufficient documentation

## 2013-12-15 DIAGNOSIS — Z8546 Personal history of malignant neoplasm of prostate: Secondary | ICD-10-CM | POA: Insufficient documentation

## 2013-12-15 DIAGNOSIS — E785 Hyperlipidemia, unspecified: Secondary | ICD-10-CM | POA: Diagnosis not present

## 2013-12-15 DIAGNOSIS — Z79899 Other long term (current) drug therapy: Secondary | ICD-10-CM | POA: Diagnosis not present

## 2013-12-15 DIAGNOSIS — I10 Essential (primary) hypertension: Secondary | ICD-10-CM | POA: Diagnosis not present

## 2013-12-15 DIAGNOSIS — Z86718 Personal history of other venous thrombosis and embolism: Secondary | ICD-10-CM | POA: Diagnosis not present

## 2013-12-15 DIAGNOSIS — J4489 Other specified chronic obstructive pulmonary disease: Secondary | ICD-10-CM | POA: Insufficient documentation

## 2013-12-15 DIAGNOSIS — Z7982 Long term (current) use of aspirin: Secondary | ICD-10-CM | POA: Insufficient documentation

## 2013-12-15 DIAGNOSIS — R079 Chest pain, unspecified: Secondary | ICD-10-CM | POA: Diagnosis not present

## 2013-12-15 LAB — BASIC METABOLIC PANEL
Anion gap: 13 (ref 5–15)
BUN: 17 mg/dL (ref 6–23)
CO2: 25 mEq/L (ref 19–32)
Calcium: 9.1 mg/dL (ref 8.4–10.5)
Chloride: 100 mEq/L (ref 96–112)
Creatinine, Ser: 1.33 mg/dL (ref 0.50–1.35)
GFR calc Af Amer: 59 mL/min — ABNORMAL LOW (ref >90)
GFR calc non Af Amer: 51 mL/min — ABNORMAL LOW (ref >90)
Glucose, Bld: 121 mg/dL — ABNORMAL HIGH (ref 70–99)
Potassium: 3.7 mEq/L (ref 3.7–5.3)
Sodium: 138 mEq/L (ref 137–147)

## 2013-12-15 LAB — CBC WITH DIFFERENTIAL/PLATELET
Basophils Absolute: 0 10*3/uL (ref 0.0–0.1)
Basophils Relative: 1 % (ref 0–1)
Eosinophils Absolute: 0.3 10*3/uL (ref 0.0–0.7)
Eosinophils Relative: 6 % — ABNORMAL HIGH (ref 0–5)
HCT: 35.7 % — ABNORMAL LOW (ref 39.0–52.0)
Hemoglobin: 11.3 g/dL — ABNORMAL LOW (ref 13.0–17.0)
Lymphocytes Relative: 33 % (ref 12–46)
Lymphs Abs: 1.5 10*3/uL (ref 0.7–4.0)
MCH: 21.8 pg — ABNORMAL LOW (ref 26.0–34.0)
MCHC: 31.7 g/dL (ref 30.0–36.0)
MCV: 68.9 fL — ABNORMAL LOW (ref 78.0–100.0)
Monocytes Absolute: 0.3 10*3/uL (ref 0.1–1.0)
Monocytes Relative: 7 % (ref 3–12)
Neutro Abs: 2.4 10*3/uL (ref 1.7–7.7)
Neutrophils Relative %: 53 % (ref 43–77)
Platelets: 185 10*3/uL (ref 150–400)
RBC: 5.18 MIL/uL (ref 4.22–5.81)
RDW: 17.7 % — ABNORMAL HIGH (ref 11.5–15.5)
WBC: 4.5 10*3/uL (ref 4.0–10.5)

## 2013-12-15 LAB — TROPONIN I
Troponin I: <0.3 ng/mL (ref <0.30)
Troponin I: <0.3 ng/mL (ref <0.30)

## 2013-12-15 MED ORDER — ASPIRIN 81 MG PO CHEW
162.0000 mg | CHEWABLE_TABLET | Freq: Once | ORAL | Status: AC
  Administered 2013-12-15: 162 mg via ORAL
  Filled 2013-12-15: qty 2

## 2013-12-15 MED ORDER — NITROGLYCERIN 0.4 MG SL SUBL
0.4000 mg | SUBLINGUAL_TABLET | SUBLINGUAL | Status: DC | PRN
  Administered 2013-12-15 (×2): 0.4 mg via SUBLINGUAL
  Filled 2013-12-15: qty 1

## 2013-12-15 MED ORDER — HEPARIN (PORCINE) IN NACL 100-0.45 UNIT/ML-% IJ SOLN
1300.0000 [IU]/h | INTRAMUSCULAR | Status: DC
  Administered 2013-12-15: 1300 [IU]/h via INTRAVENOUS
  Filled 2013-12-15: qty 250

## 2013-12-15 MED ORDER — HEPARIN BOLUS VIA INFUSION
4000.0000 [IU] | Freq: Once | INTRAVENOUS | Status: AC
  Administered 2013-12-15: 4000 [IU] via INTRAVENOUS
  Filled 2013-12-15: qty 4000

## 2013-12-15 MED ORDER — MORPHINE SULFATE 4 MG/ML IJ SOLN
4.0000 mg | Freq: Once | INTRAMUSCULAR | Status: AC
  Administered 2013-12-15: 4 mg via INTRAVENOUS
  Filled 2013-12-15: qty 1

## 2013-12-15 NOTE — ED Notes (Signed)
Pt states it comes and goes. Not having any at this time.

## 2013-12-15 NOTE — ED Provider Notes (Signed)
8:00 AM Accepted care from Dr. Reather Converse. 25M here w/ cp. Labs/imaging/ecg non-contrib. Awaiting cards consult.   10:08 AM Cards has seen pt, recommends delta trop, if neg, d/c home. Heparin was ordered by previous physician in anticipation of admission, will d/c this.   11:58 AM: Pt continues to appear well. No cp currently. Approx 4 hr delta trop neg.  I have discussed the diagnosis/risks/treatment options with the patient and believe the pt to be eligible for discharge home to follow-up with Dr. Glennie Hawk. We also discussed returning to the ED immediately if new or worsening sx occur. We discussed the sx which are most concerning (e.g., worsening cp, sob, fever) that necessitate immediate return. Medications administered to the patient during their visit and any new prescriptions provided to the patient are listed below.  Medications given during this visit Medications  nitroGLYCERIN (NITROSTAT) SL tablet 0.4 mg (0.4 mg Sublingual Given 12/15/13 0821)  morphine 4 MG/ML injection 4 mg (4 mg Intravenous Given 12/15/13 0719)  aspirin chewable tablet 162 mg (162 mg Oral Given 12/15/13 0712)  heparin bolus via infusion 4,000 Units (0 Units Intravenous Stopped 12/15/13 0828)    New Prescriptions   No medications on file   Clinical Impression 1. Chest pain, unspecified chest pain type   2. Coronary artery disease involving native coronary artery of native heart without angina pectoris      Pamella Pert, MD 12/15/13 1159

## 2013-12-15 NOTE — ED Notes (Signed)
The pt has had lt upper chest pain since 0430 a no sob no n v.  He took 2 sl nitro but the pills would not stay under his tongue.  His pain gets better if he moves around. 81 mg of aspirin this am.  Coronary artery stent

## 2013-12-15 NOTE — ED Provider Notes (Signed)
CSN: [id number]     Arrival date & time 12/15/13  0605 History   First MD Initiated Contact with Patient 12/15/13 0630     Chief Complaint  Patient presents with  . Chest Pain     (Consider location/radiation/quality/duration/timing/severity/associated sxs/prior Treatment) HPI Comments: 73 year old male with three-vessel coronary disease, bare-metal stent this in 2014, Dr. Angelena Form cardiologist, high blood pressure, angina smoking history presents with chest ache since 4:30 this morning, woke him up from sleep. Patient feels this is different than his previous chest pain he had with his heart problems. Nonradiating. No diaphoresis or recent exertional symptoms. Patient had cardiac cath this year showing three-vessel disease. Patient tried to take nitroglycerin however cannot keep it under his tongue. Symptoms have been constant since 4:30. No recent surgeries or blood clot history.  Patient is a 73 y.o. male presenting with chest pain. The history is provided by the patient.  Chest Pain Associated symptoms: no abdominal pain, no back pain, no fever, no headache, no shortness of breath and not vomiting     Past Medical History  Diagnosis Date  . GERD (gastroesophageal reflux disease)   . HLD (hyperlipidemia)   . HTN (hypertension)   . Cough secondary to angiotensin converting enzyme inhibitor (ACE-I)   . Peripheral vascular disease   . CAD (coronary artery disease)     A. Negative MV 04/2010;  B. Inadequate Stress Echo 05/2011;  C. 05/29/11 Cath: - Med Rx;  D. 05/2012 Cath/PCI: LM nl, LAD 40p, LCX ectatic, OM1 128m, OM2 90p (3.5x16 Veriflex BMS), RCA dominant 40p/d, PDA 50p, EF 55-65%.  . Iron deficiency anemia   . DVT (deep venous thrombosis)   . Microcytic anemia   . COPD (chronic obstructive pulmonary disease)   . Angina   . Shortness of breath     "can come on at anytime" (11/09/2012)  . History of blood transfusion     "I've had 2; don't know what it was related to" (11/09/2012)  .  Rotator cuff injury     "left arm; never repaired" (11/09/2012)  . Headache(784.0)     Hx: of  . Cancer     Hx: of prostate   Past Surgical History  Procedure Laterality Date  . Iliac artery aneurysm repair    . Circumcision    . Pr vein bypass graft,aorto-fem-pop Left 10/01/2009    left below knee popliteal artery to posterior tibial artery  . Cataract extraction w/ intraocular lens implant Right ~ 2008  . Coronary angioplasty with stent placement  2014    "1" (11/09/2012)  . Left heart cath  05-31-12  . Cardiovascular stress test  Aug. 2014  . Shoulder arthroscopy with open rotator cuff repair and distal clavicle acrominectomy Left 01/12/2013    Procedure: LEFT SHOULDER ARTHROSCOPY WITH DEBRIDEMENT OPEN DISTAL CLAVICLE RESECTION ,acromioplastyAND ROTATOR CUFF REPAIR;  Surgeon: Yvette Rack., MD;  Location: [facility];  Service: Orthopedics;  Laterality: Left;   Family History  Problem Relation Age of Onset  . Breast cancer Mother     deceased  . Cancer Mother   . Hyperlipidemia Mother   . Hypertension Mother   . COPD Father     deceased  . Cancer Father   . Hyperlipidemia Father   . Hypertension Father   . Hypertension Sister   . Cancer Brother   . Hypertension Brother   . Hypertension Daughter   . Hypertension Son    History  Substance Use Topics  . Smoking status: Current  Some Day Smoker -- 1.00 packs/day for 55 years    Types: Cigarettes    Last Attempt to Quit: 05/26/2011  . Smokeless tobacco: Never Used  . Alcohol Use: No    Review of Systems  Constitutional: Negative for fever and chills.  HENT: Negative for congestion.   Eyes: Negative for visual disturbance.  Respiratory: Negative for shortness of breath.   Cardiovascular: Positive for chest pain.  Gastrointestinal: Negative for vomiting and abdominal pain.  Genitourinary: Negative for dysuria and flank pain.  Musculoskeletal: Negative for back pain, neck pain and neck stiffness.  Skin: Negative for rash.   Neurological: Negative for light-headedness and headaches.      Allergies  Atenolol; Imdur; Lisinopril; and Topiramate  Home Medications   Prior to Admission medications   Medication Sig Start Date End Date Taking? Authorizing Provider  Acetaminophen (TYLENOL PO) Take 1 tablet by mouth every 6 (six) hours as needed (headache).    Historical Provider, MD  albuterol (PROVENTIL HFA;VENTOLIN HFA) 108 (90 BASE) MCG/ACT inhaler Inhale 2 puffs into the lungs 2 (two) times daily as needed for wheezing or shortness of breath.     Historical Provider, MD  amLODipine (NORVASC) 10 MG tablet Take 1 tablet (10 mg total) by mouth daily. 05/22/13   Burnell Blanks, MD  aspirin EC 81 MG tablet Take 81 mg by mouth 2 (two) times daily.    Historical Provider, MD  Iron TABS Take 1 tablet by mouth daily.     Historical Provider, MD  isosorbide mononitrate (IMDUR) 30 MG 24 hr tablet  07/27/13   Historical Provider, MD  losartan-hydrochlorothiazide (HYZAAR) 100-25 MG per tablet Take 1 tablet by mouth daily. 11/13/13   Burnell Blanks, MD  nitroGLYCERIN (NITROSTAT) 0.4 MG SL tablet Place 1 tablet (0.4 mg total) under the tongue every 5 (five) minutes as needed for chest pain. 06/01/12   Rhonda G Barrett, PA-C  omeprazole (PRILOSEC) 20 MG capsule Take 1 capsule (20 mg total) by mouth 2 (two) times daily. 06/29/12   Hillary Bow, MD  potassium chloride (K-DUR,KLOR-CON) 10 MEQ tablet take 1 tablet by mouth once daily 08/02/13   Burnell Blanks, MD  pravastatin (PRAVACHOL) 40 MG tablet Take 40 mg by mouth every evening. 11/16/12   Burnell Blanks, MD  traMADol (ULTRAM) 50 MG tablet Take 50-100 mg by mouth 3 (three) times daily as needed for pain. For pain. Maximum dose= 8 tablets per day 11/10/12   Evelene Croon Barrett, PA-C   There were no vitals taken for this visit. Physical Exam  Nursing note and vitals reviewed. Constitutional: He is oriented to person, place, and time. He appears  well-developed and well-nourished.  HENT:  Head: Normocephalic and atraumatic.  Eyes: Conjunctivae are normal. Right eye exhibits no discharge. Left eye exhibits no discharge.  Neck: Normal range of motion. Neck supple. No tracheal deviation present.  Cardiovascular: Normal rate, regular rhythm and intact distal pulses.   Pulmonary/Chest: Effort normal and breath sounds normal.  Abdominal: Soft. He exhibits no distension. There is no tenderness. There is no guarding.  Musculoskeletal: He exhibits no edema and no tenderness.  Neurological: He is alert and oriented to person, place, and time.  Skin: Skin is warm. No rash noted.  Psychiatric: He has a normal mood and affect.    ED Course  Procedures (including critical care time) Labs Review Labs Reviewed  BASIC METABOLIC PANEL  CBC WITH DIFFERENTIAL  TROPONIN I    Imaging Review Dg Chest  Port 1 View  12/15/2013   CLINICAL DATA:  Left-sided chest pain for 2.5 hr. History of cardiac stent insertion.  EXAM: PORTABLE CHEST - 1 VIEW  COMPARISON:  None.  FINDINGS: Cardiomediastinal silhouette is unremarkable for this low inspiratory portable examination with crowded vasculature markings. The lungs are clear without pleural effusions or focal consolidations. Trachea projects midline and there is no pneumothorax. Included soft tissue planes and osseous structures are non-suspicious. Severe right acromioclavicular osteoarthrosis an undersurface spurring.  IMPRESSION: No acute cardiopulmonary process.   Electronically Signed   By: Elon Alas   On: 12/15/2013 06:54     EKG Interpretation   Date/Time:  Saturday December 15 2013 06:11:20 EDT Ventricular Rate:  66 PR Interval:  218 QRS Duration: 90 QT Interval:  404 QTC Calculation: 423 R Axis:   51 Text Interpretation:  Sinus rhythm with 1st degree A-V block Otherwise  normal ECG Confirmed by Dim Meisinger  MD, Karter Hellmer ([id number]) on 12/15/2013 6:31:25 AM      MDM   Final diagnoses:  Chest  pain, unspecified chest pain type   Patient with known significant 3 vessel disease and stent presents with chest ache constant since 4:30. Patient has a high risk history however has had a recent cath done. Last cardiology note and cath results reviewed. Plan for nitroglycerin, morphine, aspirin and cardiology consult to discuss medical management.  Discussed the case with cardiologist on call who recommended starting heparin until they evaluate the ER if patient still having chest pain.  Heparin ordered per pharmacy consult.  Patient's care be signed out to ED provider to followup symptoms and follow cardiology recommendation.   Chest pain, CAD   Mariea Clonts, MD 12/15/13 [phone]

## 2013-12-15 NOTE — ED Notes (Signed)
Patient took 1 SL NTG tab which dissolved in mouth, but didn't effect pain. Explained that he attempted to take another 2 SL tabs, but they wouldn't stay under his tongue.

## 2013-12-15 NOTE — Consult Note (Signed)
CARDIOLOGY CONSULT NOTE   Patient ID: ANTWIAN SANTAANA MRN: [id number] DOB/AGE: Jun 25, 1940 73 y.o.  Admit Date: 12/15/2013  Primary Physician: [name], MD  Primary Cardiologist    [facility] Reason for Consultation:   Clinical Summary Mr. Dowdy is a 73 y.o.male. He has known coronary artery disease. He has had interventions in the past. In April, 2015, he had admission for chest pain. He went home and was seen back in the office. Because of some more chest pain he had an outpatient catheterization. This study showed that his vessels were stable. He was then seen in the office in followup. The office visit was in May, 2015. This morning he noted some chest discomfort. He try to take a nitroglycerin but for some reason he could not keep it under his tongue. He then came to the emergency room. His pain is now gone. His first troponin is normal. EKG reveals no diagnostic change.   Allergies  Allergen Reactions  . Atenolol     bradycardia  . Imdur [Isosorbide Mononitrate] Nausea Only and Other (See Comments)    Headache  . Lisinopril Other (See Comments)    Cough  . Topiramate     Shortness of breath and leg cramps    Medications Scheduled Medications:     Infusions: . heparin 1,300 Units/hr (12/15/13 0823)     PRN Medications:  nitroGLYCERIN   Past Medical History  Diagnosis Date  . GERD (gastroesophageal reflux disease)   . HLD (hyperlipidemia)   . HTN (hypertension)   . Cough secondary to angiotensin converting enzyme inhibitor (ACE-I)   . Peripheral vascular disease   . CAD (coronary artery disease)     A. Negative MV 04/2010;  B. Inadequate Stress Echo 05/2011;  C. 05/29/11 Cath: - Med Rx;  D. 05/2012 Cath/PCI: LM nl, LAD 40p, LCX ectatic, OM1 163m, OM2 90p (3.5x16 Veriflex BMS), RCA dominant 40p/d, PDA 50p, EF 55-65%.  . Iron deficiency anemia   . DVT (deep venous thrombosis)   . Microcytic anemia   . COPD (chronic obstructive pulmonary disease)     . Angina   . Shortness of breath     "can come on at anytime" (11/09/2012)  . History of blood transfusion     "I've had 2; don't know what it was related to" (11/09/2012)  . Rotator cuff injury     "left arm; never repaired" (11/09/2012)  . Headache(784.0)     Hx: of  . Cancer     Hx: of prostate    Past Surgical History  Procedure Laterality Date  . Iliac artery aneurysm repair    . Circumcision    . Pr vein bypass graft,aorto-fem-pop Left 10/01/2009    left below knee popliteal artery to posterior tibial artery  . Cataract extraction w/ intraocular lens implant Right ~ 2008  . Coronary angioplasty with stent placement  2014    "1" (11/09/2012)  . Left heart cath  05-31-12  . Cardiovascular stress test  Aug. 2014  . Shoulder arthroscopy with open rotator cuff repair and distal clavicle acrominectomy Left 01/12/2013    Procedure: LEFT SHOULDER ARTHROSCOPY WITH DEBRIDEMENT OPEN DISTAL CLAVICLE RESECTION ,acromioplastyAND ROTATOR CUFF REPAIR;  Surgeon: Yvette Rack., MD;  Location: [facility];  Service: Orthopedics;  Laterality: Left;    Family History  Problem Relation Age of Onset  . Breast cancer Mother     deceased  . Cancer Mother   . Hyperlipidemia Mother   . Hypertension  Mother   . COPD Father     deceased  . Cancer Father   . Hyperlipidemia Father   . Hypertension Father   . Hypertension Sister   . Cancer Brother   . Hypertension Brother   . Hypertension Daughter   . Hypertension Son     Social History Mr. Niazi reports that he has been smoking Cigarettes.  He has a 55 pack-year smoking history. He has never used smokeless tobacco. Mr. Nester reports that he does not drink alcohol.  Review of Systems  Patient denies fever, chills, headache, sweats, rash, change in vision, change in hearing, nausea vomiting, urinary symptoms. All other systems are reviewed and are negative.  Physical Examination Blood pressure 113/62, pulse 57, temperature 97.5 F (36.4  C), temperature source Oral, resp. rate 12, SpO2 96.00%. No intake or output data in the 24 hours ending 12/15/13 0954  Patient is oriented to person time and place. Affect is normal. Head is atraumatic. Lungs are clear. Respiratory effort is nonlabored. Is no jugulovenous distention. Cardiac exam her vitals S1 and S2. The abdomen is soft. There is no peripheral edema. There are no musculoskeletal deformities. There are no skin rashes.  Prior Cardiac Testing/Procedures  Lab Results  Basic Metabolic Panel:  Recent Labs Lab 12/15/13 0634  NA 138  K 3.7  CL 100  CO2 25  GLUCOSE 121*  BUN 17  CREATININE 1.33  CALCIUM 9.1    Liver Function Tests: No results found for this basename: AST, ALT, ALKPHOS, BILITOT, PROT, ALBUMIN,  in the last 168 hours  CBC:  Recent Labs Lab 12/15/13 0634  WBC 4.5  NEUTROABS 2.4  HGB 11.3*  HCT 35.7*  MCV 68.9*  PLT 185    Cardiac Enzymes:  Recent Labs Lab 12/15/13 0634  TROPONINI <0.30    BNP: No components found with this basename: POCBNP,    Radiology: Dg Chest Port 1 View  12/15/2013   CLINICAL DATA:  Left-sided chest pain for 2.5 hr. History of cardiac stent insertion.  EXAM: PORTABLE CHEST - 1 VIEW  COMPARISON:  None.  FINDINGS: Cardiomediastinal silhouette is unremarkable for this low inspiratory portable examination with crowded vasculature markings. The lungs are clear without pleural effusions or focal consolidations. Trachea projects midline and there is no pneumothorax. Included soft tissue planes and osseous structures are non-suspicious. Severe right acromioclavicular osteoarthrosis an undersurface spurring.  IMPRESSION: No acute cardiopulmonary process.   Electronically Signed   By: Elon Alas   On: 12/15/2013 06:54     ECG: I personally reviewed today's EKG and old EKGs. There are no significant changes.   Impression and Recommendations   Chest pressure    The patient has some chest pressure today. So far  there is no evidence of significant ischemia. His last catheterization in April, 2015 revealed stable coronaries. If his second troponin is normal, he can be discharged home with followup with Dr. Angelena Form. If his troponins turn positive, please recontact me and we will admit the patient for further evaluation.    CAD (coronary artery disease) He has known coronary disease but it is stable. No further workup is needed today unless his enzymes become positive.      Daryel November, MD  12/15/2013, 9:54 AM

## 2013-12-15 NOTE — Progress Notes (Signed)
ANTICOAGULATION CONSULT NOTE - Initial Consult  Pharmacy Consult for Heparin Indication: chest pain/ACS  Allergies  Allergen Reactions  . Atenolol     bradycardia  . Imdur [Isosorbide Mononitrate] Nausea Only and Other (See Comments)    Headache  . Lisinopril Other (See Comments)    Cough  . Topiramate     Shortness of breath and leg cramps    Patient Measurements:   Ht: 70 in  Wt: 108 kg IBW: 72 kg Heparin Dosing Weight: 95 kg  Vital Signs: BP: 119/58 mmHg (09/05 0700) Pulse Rate: 58 (09/05 0700)  Labs: No results found for this basename: HGB, HCT, PLT, APTT, LABPROT, INR, HEPARINUNFRC, CREATININE, CKTOTAL, CKMB, TROPONINI,  in the last 72 hours  The CrCl is unknown because both a height and weight (above a minimum accepted value) are required for this calculation.   Medical History: Past Medical History  Diagnosis Date  . GERD (gastroesophageal reflux disease)   . HLD (hyperlipidemia)   . HTN (hypertension)   . Cough secondary to angiotensin converting enzyme inhibitor (ACE-I)   . Peripheral vascular disease   . CAD (coronary artery disease)     A. Negative MV 04/2010;  B. Inadequate Stress Echo 05/2011;  C. 05/29/11 Cath: - Med Rx;  D. 05/2012 Cath/PCI: LM nl, LAD 40p, LCX ectatic, OM1 158m, OM2 90p (3.5x16 Veriflex BMS), RCA dominant 40p/d, PDA 50p, EF 55-65%.  . Iron deficiency anemia   . DVT (deep venous thrombosis)   . Microcytic anemia   . COPD (chronic obstructive pulmonary disease)   . Angina   . Shortness of breath     "can come on at anytime" (11/09/2012)  . History of blood transfusion     "I've had 2; don't know what it was related to" (11/09/2012)  . Rotator cuff injury     "left arm; never repaired" (11/09/2012)  . Headache(784.0)     Hx: of  . Cancer     Hx: of prostate    Medications:  See electronic med rec  Assessment: 73 y.o. male presents with CP. To begin heparin for r/o ACS.  Goal of Therapy:  Heparin level 0.3-0.7 units/ml Monitor  platelets by anticoagulation protocol: Yes   Plan:  1. Heparin IV bolus 4000 units 2. Heparin gtt at 1300 units/hr 3. Will f/u 8 hr heparin level 4. Daily heparin level and CBC  Sherlon Handing, PharmD, BCPS Clinical pharmacist, pager [phone] 12/15/2013,7:16 AM

## 2013-12-15 NOTE — ED Notes (Signed)
Phlebotomy at the bedside  

## 2013-12-15 NOTE — ED Notes (Signed)
Pt states the pain came back.

## 2013-12-15 NOTE — ED Notes (Signed)
Pt states the cp went away but now is coming back

## 2013-12-18 ENCOUNTER — Encounter: Payer: Self-pay | Admitting: Vascular Surgery

## 2013-12-19 ENCOUNTER — Encounter: Payer: Self-pay | Admitting: Vascular Surgery

## 2013-12-19 ENCOUNTER — Ambulatory Visit (INDEPENDENT_AMBULATORY_CARE_PROVIDER_SITE_OTHER)
Admission: RE | Admit: 2013-12-19 | Discharge: 2013-12-19 | Disposition: A | Discharge status: Home / Self Care | Payer: Medicare Other | Source: Ambulatory Visit | Attending: Vascular Surgery | Admitting: Vascular Surgery

## 2013-12-19 ENCOUNTER — Ambulatory Visit (INDEPENDENT_AMBULATORY_CARE_PROVIDER_SITE_OTHER): Payer: Medicare Other | Admitting: Vascular Surgery

## 2013-12-19 ENCOUNTER — Ambulatory Visit (HOSPITAL_COMMUNITY)
Admission: RE | Admit: 2013-12-19 | Discharge: 2013-12-19 | Disposition: A | Discharge status: Home / Self Care | Payer: Medicare Other | Source: Ambulatory Visit | Attending: Vascular Surgery | Admitting: Vascular Surgery

## 2013-12-19 VITALS — BP 150/75 | HR 58 | Temp 98.1°F | Resp 16 | Ht 71.0 in | Wt 241.0 lb

## 2013-12-19 DIAGNOSIS — Z95828 Presence of other vascular implants and grafts: Secondary | ICD-10-CM | POA: Insufficient documentation

## 2013-12-19 DIAGNOSIS — I10 Essential (primary) hypertension: Secondary | ICD-10-CM | POA: Insufficient documentation

## 2013-12-19 DIAGNOSIS — I739 Peripheral vascular disease, unspecified: Secondary | ICD-10-CM | POA: Insufficient documentation

## 2013-12-19 DIAGNOSIS — Z48812 Encounter for surgical aftercare following surgery on the circulatory system: Secondary | ICD-10-CM

## 2013-12-19 DIAGNOSIS — I743 Embolism and thrombosis of arteries of the lower extremities: Secondary | ICD-10-CM | POA: Insufficient documentation

## 2013-12-19 DIAGNOSIS — E785 Hyperlipidemia, unspecified: Secondary | ICD-10-CM | POA: Diagnosis not present

## 2013-12-19 DIAGNOSIS — F17200 Nicotine dependence, unspecified, uncomplicated: Secondary | ICD-10-CM | POA: Insufficient documentation

## 2013-12-19 NOTE — Assessment & Plan Note (Signed)
The patient's left popliteal to posterior tibial artery bypass graft is patent without any areas of narrowing noted. I've ordered a fall duplex scan in [facility] in one year and I'll see him back at that time. He knows to call sooner if he has problems.

## 2013-12-19 NOTE — Progress Notes (Signed)
   Patient name: Joel [name] MRN: [id number] DOB: 1941-01-14 Sex: male  REASON FOR VISIT: Follow up of peripheral vascular disease  HPI: Joel [name] is a 73 y.o. male who underwent a left below knee popliteal to tibial artery bypass in June of 2011. He comes in for her yearly follow up visit. He denies claudication or rest pain. He denies any history of nonhealing ulcers. He does have a history of chronic back pain which is not new. He quit smoking a long time ago.  REVIEW OF SYSTEMS: Valu.Nieves ] denotes positive finding; [  ] denotes negative finding  CARDIOVASCULAR:  [ ]  chest pain   [ ]  dyspnea on exertion    CONSTITUTIONAL:  [ ]  fever   [ ]  chills  PHYSICAL EXAM: Filed Vitals:   12/19/13 1129  BP: 150/75  Pulse: 58  Temp: 98.1 F (36.7 C)  TempSrc: Oral  Resp: 16  Height: 5\' 11"  (1.803 m)  Weight: 241 lb (109.317 kg)  SpO2: 98%   Body mass index is 33.63 kg/(m^2). GENERAL: The patient is a well-nourished male, in no acute distress. The vital signs are documented above. CARDIOVASCULAR: There is a regular rate and rhythm. PULMONARY: There is good air exchange bilaterally without wheezing or rales. He has palpable femoral pulses are palpable graft pulse and posterior tibial pulse on the left. Both feet are warm and well-perfused.  I have independently interpreted his duplex today which shows that his bypass graft is widely patent without any areas of narrowing noted. ABI on the right is 90% ABI on the left is 98%. He has triphasic waveforms throughout the left lower extremity.  MEDICAL ISSUES:  PVD (peripheral vascular disease) with claudication The patient's left popliteal to posterior tibial artery bypass graft is patent without any areas of narrowing noted. I've ordered a fall duplex scan in [facility] in one year and I'll see him back at that time. He knows to call sooner if he has problems.   [facility] Beeper:  [phone]

## 2013-12-19 NOTE — Addendum Note (Signed)
Addended by: Mena Goes on: 12/19/2013 04:02 PM   Modules accepted: Orders

## 2014-02-15 ENCOUNTER — Ambulatory Visit (HOSPITAL_COMMUNITY)
Admission: RE | Admit: 2014-02-15 | Discharge: 2014-02-15 | Disposition: A | Discharge status: Home / Self Care | Payer: Medicare Other | Source: Ambulatory Visit | Attending: Internal Medicine | Admitting: Internal Medicine

## 2014-02-15 ENCOUNTER — Other Ambulatory Visit (HOSPITAL_COMMUNITY): Payer: Self-pay | Admitting: Internal Medicine

## 2014-02-15 DIAGNOSIS — M7989 Other specified soft tissue disorders: Secondary | ICD-10-CM

## 2014-02-15 NOTE — Progress Notes (Signed)
*  PRELIMINARY RESULTS* Vascular Ultrasound Left lower extremity venous duplex has been completed.  Preliminary findings: no evidence of DVT or baker's cyst .  Called results to on-call MD, Dr. Inda Merlin. Let patient leave.  Landry Mellow, RDMS, RVT  02/15/2014, 5:23 PM

## 2014-03-04 ENCOUNTER — Ambulatory Visit (INDEPENDENT_AMBULATORY_CARE_PROVIDER_SITE_OTHER): Payer: Medicare Other | Admitting: Cardiovascular Disease

## 2014-03-04 ENCOUNTER — Encounter: Payer: Self-pay | Admitting: Cardiovascular Disease

## 2014-03-04 VITALS — BP 118/64 | HR 73 | Ht 70.0 in | Wt 246.6 lb

## 2014-03-04 DIAGNOSIS — I10 Essential (primary) hypertension: Secondary | ICD-10-CM

## 2014-03-04 DIAGNOSIS — E785 Hyperlipidemia, unspecified: Secondary | ICD-10-CM

## 2014-03-04 DIAGNOSIS — I2510 Atherosclerotic heart disease of native coronary artery without angina pectoris: Secondary | ICD-10-CM

## 2014-03-04 MED ORDER — PRAVASTATIN SODIUM 40 MG PO TABS: 40.0000 mg | ORAL_TABLET | Freq: Every evening | ORAL | Status: DC

## 2014-03-04 NOTE — Progress Notes (Signed)
History of Present Illness: 73 yo male with history of HTN, HLD, CAD, PAD who is here today for cardiac follow up. He has been followed in the past by Dr. Lia Foyer. Cardiac cath February 2014 with moderate LAD and RCA disease, severe OM2 stenosis treated with bare metal stent. Admitted to [facility] 11/09/12 with chest pain. Ruled out for MI. Lexiscan stress myoview 11/16/12 without ischemia. Normal LVEF.  I saw him in the office 07/27/13 and he c/o exertional chest pain c/w unstable angina. Cardiac cath 08/01/13 with patent stent Circumflex and mild to moderate disease in the LAD and RCA. His PAD is followed in [facility] by Dr. Scot Dock. Seen in the ED by Dr. Ron Parker 12/15/13 with c/o chest pain. Negative troponin and discharged home.   He is here today for follow up. He tells me that he was working on a car two days ago and that evening had onset of left sided chest pain. He took 2 NTG and a tramadol and the pain went away. The pain only occurred once yesterday but only lasted for several minutes. Also mild pain this am but it went away when he started walking and working on the car again.   Primary Care Physician:  Seward Carol  Last Lipid Profile:Lipid Panel     Component Value Date/Time   CHOL 164 02/06/2013 0741   TRIG 122.0 02/06/2013 0741   HDL 34.30* 02/06/2013 0741   CHOLHDL 5 02/06/2013 0741   VLDL 24.4 02/06/2013 0741   LDLCALC 105* 02/06/2013 0741   Past Medical History  Diagnosis Date  . GERD (gastroesophageal reflux disease)   . HLD (hyperlipidemia)   . HTN (hypertension)   . Cough secondary to angiotensin converting enzyme inhibitor (ACE-I)   . Peripheral vascular disease   . CAD (coronary artery disease)     A. Negative MV 04/2010;  B. Inadequate Stress Echo 05/2011;  C. 05/29/11 Cath: - Med Rx;  D. 05/2012 Cath/PCI: LM nl, LAD 40p, LCX ectatic, OM1 164m, OM2 90p (3.5x16 Veriflex BMS), RCA dominant 40p/d, PDA 50p, EF 55-65%.  . Iron deficiency anemia   . DVT (deep venous thrombosis)   .  Microcytic anemia   . COPD (chronic obstructive pulmonary disease)   . Angina   . Shortness of breath     "can come on at anytime" (11/09/2012)  . History of blood transfusion     "I've had 2; don't know what it was related to" (11/09/2012)  . Rotator cuff injury     "left arm; never repaired" (11/09/2012)  . Headache(784.0)     Hx: of  . Cancer     Hx: of prostate    Past Surgical History  Procedure Laterality Date  . Iliac artery aneurysm repair    . Circumcision    . Pr vein bypass graft,aorto-fem-pop Left 10/01/2009    left below knee popliteal artery to posterior tibial artery  . Cataract extraction w/ intraocular lens implant Right ~ 2008  . Coronary angioplasty with stent placement  2014    "1" (11/09/2012)  . Left heart cath  05-31-12  . Cardiovascular stress test  Aug. 2014  . Shoulder arthroscopy with open rotator cuff repair and distal clavicle acrominectomy Left 01/12/2013    Procedure: LEFT SHOULDER ARTHROSCOPY WITH DEBRIDEMENT OPEN DISTAL CLAVICLE RESECTION ,acromioplastyAND ROTATOR CUFF REPAIR;  Surgeon: Yvette Rack., MD;  Location: [place] [facility];  Service: Orthopedics;  Laterality: Left;    Current Outpatient Prescriptions  Medication Sig Dispense Refill  .  albuterol (PROVENTIL HFA;VENTOLIN HFA) 108 (90 BASE) MCG/ACT inhaler Inhale 2 puffs into the lungs 2 (two) times daily as needed for wheezing or shortness of breath.     Marland Kitchen amLODipine (NORVASC) 5 MG tablet Take 5 mg by mouth daily.    Marland Kitchen aspirin EC 81 MG tablet Take 81 mg by mouth daily.    . Iron TABS Take 1 tablet by mouth daily.     Marland Kitchen losartan-hydrochlorothiazide (HYZAAR) 100-25 MG per tablet Take 1 tablet by mouth daily. 30 tablet 4  . nitroGLYCERIN (NITROSTAT) 0.4 MG SL tablet Place 1 tablet (0.4 mg total) under the tongue every 5 (five) minutes as needed for chest pain. 25 tablet 3  . omeprazole (PRILOSEC) 20 MG capsule Take 1 capsule (20 mg total) by mouth 2 (two) times daily.    . potassium chloride  (K-DUR,KLOR-CON) 10 MEQ tablet take 1 tablet by mouth once daily 30 tablet 1  . traMADol (ULTRAM) 50 MG tablet Take 50-100 mg by mouth 3 (three) times daily as needed for pain. For pain. Maximum dose= 8 tablets per day    . hydrochlorothiazide (HYDRODIURIL) 25 MG tablet Take 25 mg by mouth daily.  0   No current facility-administered medications for this visit.    Allergies  Allergen Reactions  . Atenolol     bradycardia  . Imdur [Isosorbide Mononitrate] Nausea Only and Other (See Comments)    Headache  . Lisinopril Other (See Comments)    Cough  . Topiramate     Shortness of breath and leg cramps    History   Social History  . Marital Status: Widowed    Spouse Name: N/A    Number of Children: N/A  . Years of Education: N/A   Occupational History  . Retired    Social History Main Topics  . Smoking status: Former Smoker -- 1.00 packs/day for 55 years    Types: Cigarettes    Quit date: 05/26/2011  . Smokeless tobacco: Never Used  . Alcohol Use: No  . Drug Use: No  . Sexual Activity: Not on file     Comment: 11/09/2012 "I don't have to answer questions about sex"   Other Topics Concern  . Not on file   Social History Narrative   Single. Retired. 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  Lives in [place] by himself.    Family History  Problem Relation Age of Onset  . Breast cancer Mother     deceased  . Cancer Mother   . Hyperlipidemia Mother   . Hypertension Mother   . COPD Father     deceased  . Cancer Father   . Hyperlipidemia Father   . Hypertension Father   . Hypertension Sister   . Cancer Brother   . Hypertension Brother   . Hypertension Daughter   . Hypertension Son     Review of Systems:  As stated in the HPI and otherwise negative.   BP 118/64 mmHg  Pulse 73  Ht 5\' 10"  (1.778 m)  Wt 246 lb 9.6 oz (111.857 kg)  BMI 35.38 kg/m2  SpO2 95%  Physical Examination: General: Well developed, well nourished, NAD HEENT: OP clear, mucus membranes moist SKIN:  warm, dry. No rashes. Neuro: No focal deficits Musculoskeletal: Muscle strength 5/5 all ext Psychiatric: Mood and affect normal Neck: No JVD, no carotid bruits, no thyromegaly, no lymphadenopathy. Lungs:Clear bilaterally, no wheezes, rhonci, crackles Cardiovascular: Regular rate and rhythm. No murmurs, gallops or rubs. Abdomen:Soft. Bowel sounds present. Non-tender.  Extremities:  No lower extremity edema. Pulses are 2 + in the bilateral DP/PT.  Cardiac cath 08/01/13: Left main: No obstructive disease.  Left Anterior Descending Artery: Large caliber vessel that courses to the apex. The proximal and mid vessel is ectatic. There is diffuse 30-40% stenosis in the mid vessel. There are mild luminal irregularities in the distal vessel.  Circumflex Artery: Large caliber vessel with two obtuse marginal branches. The first OM branch is small in caliber and is chronically occluded in the mid vessel. The second OM branch is patent with patent stent, minimal restenosis. The distal segment of the OM branch has diffuse plaque.  Right Coronary Artery: Large caliber dominant vessel with proximal Shepherd's crook. The proximal vessel has diffuse 40% stenosis. The mid vessel is ectatic with 40% stenosis. The distal vessel has diffuse 50% stenosis. The PDA has 50% proximal stenosis followed by diffuse plaque.  Left Ventricular Angiogram: LVEF=65%.   EKG: NSR, rate 64 bpm. 1st degree AV block. LVH  Assessment and Plan:   1. CAD: Recent left sided chest pains. This does not sound cardiac. EKG with no ischemic changes.  He does not tolerate Imdur secondary to severe headache. Will continue medical management of CAD with ASA, amlodipine, Losartan. Restart statin (he has been forgetting to take it).   2. HTN: BP controlled today.   3. Hyperlipidemia: Continue statin. He has been on Pravastatin 40 mg daily but has been out. Will restart.

## 2014-03-04 NOTE — Patient Instructions (Addendum)
Your physician recommends that you schedule a follow-up appointment in:  3-4 months. Scheduled for June 18, 2014 at 2:15  Your physician has recommended you make the following change in your medication:  Resume Pravachol 40 mg by mouth daily.

## 2014-03-21 ENCOUNTER — Encounter (HOSPITAL_COMMUNITY): Payer: Self-pay | Admitting: Cardiology

## 2014-04-12 DIAGNOSIS — D500 Iron deficiency anemia secondary to blood loss (chronic): Secondary | ICD-10-CM | POA: Insufficient documentation

## 2014-04-17 ENCOUNTER — Other Ambulatory Visit: Payer: Self-pay

## 2014-04-17 MED ORDER — LOSARTAN POTASSIUM-HCTZ 100-25 MG PO TABS: 1.0000 | ORAL_TABLET | Freq: Every day | ORAL | Status: DC

## 2014-05-04 ENCOUNTER — Emergency Department (HOSPITAL_COMMUNITY): Payer: PPO

## 2014-05-04 ENCOUNTER — Encounter (HOSPITAL_COMMUNITY): Payer: Self-pay | Admitting: *Deleted

## 2014-05-04 ENCOUNTER — Emergency Department (HOSPITAL_COMMUNITY)
Admission: EM | Admit: 2014-05-04 | Discharge: 2014-05-04 | Disposition: A | Discharge status: Home / Self Care | Payer: PPO | Attending: Emergency Medicine | Admitting: Emergency Medicine

## 2014-05-04 DIAGNOSIS — R51 Headache: Secondary | ICD-10-CM | POA: Diagnosis not present

## 2014-05-04 DIAGNOSIS — Z7982 Long term (current) use of aspirin: Secondary | ICD-10-CM | POA: Insufficient documentation

## 2014-05-04 DIAGNOSIS — E785 Hyperlipidemia, unspecified: Secondary | ICD-10-CM | POA: Diagnosis not present

## 2014-05-04 DIAGNOSIS — Z87828 Personal history of other (healed) physical injury and trauma: Secondary | ICD-10-CM | POA: Insufficient documentation

## 2014-05-04 DIAGNOSIS — Z8546 Personal history of malignant neoplasm of prostate: Secondary | ICD-10-CM | POA: Insufficient documentation

## 2014-05-04 DIAGNOSIS — Z87891 Personal history of nicotine dependence: Secondary | ICD-10-CM | POA: Insufficient documentation

## 2014-05-04 DIAGNOSIS — R42 Dizziness and giddiness: Secondary | ICD-10-CM | POA: Diagnosis present

## 2014-05-04 DIAGNOSIS — I10 Essential (primary) hypertension: Secondary | ICD-10-CM | POA: Insufficient documentation

## 2014-05-04 DIAGNOSIS — Z86718 Personal history of other venous thrombosis and embolism: Secondary | ICD-10-CM | POA: Insufficient documentation

## 2014-05-04 DIAGNOSIS — Z9861 Coronary angioplasty status: Secondary | ICD-10-CM | POA: Diagnosis not present

## 2014-05-04 DIAGNOSIS — J449 Chronic obstructive pulmonary disease, unspecified: Secondary | ICD-10-CM | POA: Insufficient documentation

## 2014-05-04 DIAGNOSIS — Z9889 Other specified postprocedural states: Secondary | ICD-10-CM | POA: Insufficient documentation

## 2014-05-04 DIAGNOSIS — N289 Disorder of kidney and ureter, unspecified: Secondary | ICD-10-CM | POA: Diagnosis not present

## 2014-05-04 DIAGNOSIS — Z79899 Other long term (current) drug therapy: Secondary | ICD-10-CM | POA: Diagnosis not present

## 2014-05-04 DIAGNOSIS — Z862 Personal history of diseases of the blood and blood-forming organs and certain disorders involving the immune mechanism: Secondary | ICD-10-CM | POA: Diagnosis not present

## 2014-05-04 DIAGNOSIS — I2510 Atherosclerotic heart disease of native coronary artery without angina pectoris: Secondary | ICD-10-CM | POA: Diagnosis not present

## 2014-05-04 DIAGNOSIS — K219 Gastro-esophageal reflux disease without esophagitis: Secondary | ICD-10-CM | POA: Diagnosis not present

## 2014-05-04 LAB — CBC WITH DIFFERENTIAL/PLATELET
Basophils Absolute: 0 10*3/uL (ref 0.0–0.1)
Basophils Relative: 0 % (ref 0–1)
Eosinophils Absolute: 0.1 10*3/uL (ref 0.0–0.7)
Eosinophils Relative: 1 % (ref 0–5)
HCT: 39 % (ref 39.0–52.0)
Hemoglobin: 12.1 g/dL — ABNORMAL LOW (ref 13.0–17.0)
Lymphocytes Relative: 16 % (ref 12–46)
Lymphs Abs: 1.1 10*3/uL (ref 0.7–4.0)
MCH: 21.5 pg — ABNORMAL LOW (ref 26.0–34.0)
MCHC: 31 g/dL (ref 30.0–36.0)
MCV: 69.4 fL — ABNORMAL LOW (ref 78.0–100.0)
Monocytes Absolute: 0.3 10*3/uL (ref 0.1–1.0)
Monocytes Relative: 5 % (ref 3–12)
Neutro Abs: 5.3 10*3/uL (ref 1.7–7.7)
Neutrophils Relative %: 78 % — ABNORMAL HIGH (ref 43–77)
Platelets: 186 10*3/uL (ref 150–400)
RBC: 5.62 MIL/uL (ref 4.22–5.81)
RDW: 17.8 % — ABNORMAL HIGH (ref 11.5–15.5)
WBC: 6.8 10*3/uL (ref 4.0–10.5)

## 2014-05-04 LAB — COMPREHENSIVE METABOLIC PANEL
ALT: 23 U/L (ref 0–53)
AST: 19 U/L (ref 0–37)
Albumin: 4 g/dL (ref 3.5–5.2)
Alkaline Phosphatase: 80 U/L (ref 39–117)
Anion gap: 8 (ref 5–15)
BUN: 11 mg/dL (ref 6–23)
CO2: 30 mmol/L (ref 19–32)
Calcium: 9.6 mg/dL (ref 8.4–10.5)
Chloride: 102 mmol/L (ref 96–112)
Creatinine, Ser: 1.41 mg/dL — ABNORMAL HIGH (ref 0.50–1.35)
GFR calc Af Amer: 55 mL/min — ABNORMAL LOW (ref >90)
GFR calc non Af Amer: 48 mL/min — ABNORMAL LOW (ref >90)
Glucose, Bld: 151 mg/dL — ABNORMAL HIGH (ref 70–99)
Potassium: 3.7 mmol/L (ref 3.5–5.1)
Sodium: 140 mmol/L (ref 135–145)
Total Bilirubin: 0.3 mg/dL (ref 0.3–1.2)
Total Protein: 7 g/dL (ref 6.0–8.3)

## 2014-05-04 LAB — I-STAT TROPONIN, ED: Troponin i, poc: 0 ng/mL (ref 0.00–0.08)

## 2014-05-04 MED ORDER — METOCLOPRAMIDE HCL 5 MG/ML IJ SOLN
10.0000 mg | Freq: Once | INTRAMUSCULAR | Status: AC
  Administered 2014-05-04: 10 mg via INTRAVENOUS
  Filled 2014-05-04: qty 2

## 2014-05-04 MED ORDER — AMLODIPINE BESYLATE 10 MG PO TABS: 10.0000 mg | ORAL_TABLET | Freq: Every day | ORAL | Status: DC

## 2014-05-04 MED ORDER — DIPHENHYDRAMINE HCL 50 MG/ML IJ SOLN
25.0000 mg | Freq: Once | INTRAMUSCULAR | Status: AC
  Administered 2014-05-04: 25 mg via INTRAVENOUS
  Filled 2014-05-04: qty 1

## 2014-05-04 MED ORDER — MECLIZINE HCL 12.5 MG PO TABS: 12.5000 mg | ORAL_TABLET | Freq: Three times a day (TID) | ORAL | Status: DC | PRN

## 2014-05-04 MED ORDER — AMLODIPINE BESYLATE 5 MG PO TABS
5.0000 mg | ORAL_TABLET | Freq: Once | ORAL | Status: AC
  Administered 2014-05-04: 5 mg via ORAL
  Filled 2014-05-04: qty 1

## 2014-05-04 NOTE — Discharge Instructions (Signed)
Increase norvasc to 10 mg daily.   Your kidney function is slightly worse.   Take meclizine for dizziness as needed.   Recheck BP and kidney function with your doctor in a week.   Return to ER if you have severe dizziness, passing out, headaches, room spinning, unsteadiness.

## 2014-05-04 NOTE — ED Notes (Signed)
Pt reports having headache and dizziness for several days, checked bp at home and reports bp 179/75. ekg done at triage, no acute distress noted.

## 2014-05-04 NOTE — ED Provider Notes (Signed)
CSN: [id number]     Arrival date & time 05/04/14  1505 History   First MD Initiated Contact with Patient 05/04/14 1515     Chief Complaint  Patient presents with  . Hypertension  . Dizziness     (Consider location/radiation/quality/duration/timing/severity/associated sxs/prior Treatment) The history is provided by the patient.  Joel [name] is a 74 y.o. male hx of GERD, HL, HTN here with headache, dizziness. Intermittent headache and dizziness for the last several days. He denies any vertigo or lightheadedness. Didn't feel like he wants to pass out. He just feels "wobbly"sometimes but didn't actually fall. Denies any history of strokes in the past. He checked his blood pressure and was 179/75. He has been taking his blood pressure medicines as prescribed. He is currently on Norvasc and Hyzaar. He has extensive history of CAD but states that this is different than his previous MIs.    Past Medical History  Diagnosis Date  . GERD (gastroesophageal reflux disease)   . HLD (hyperlipidemia)   . HTN (hypertension)   . Cough secondary to angiotensin converting enzyme inhibitor (ACE-I)   . Peripheral vascular disease   . CAD (coronary artery disease)     A. Negative MV 04/2010;  B. Inadequate Stress Echo 05/2011;  C. 05/29/11 Cath: - Med Rx;  D. 05/2012 Cath/PCI: LM nl, LAD 40p, LCX ectatic, OM1 131m, OM2 90p (3.5x16 Veriflex BMS), RCA dominant 40p/d, PDA 50p, EF 55-65%.  . Iron deficiency anemia   . DVT (deep venous thrombosis)   . Microcytic anemia   . COPD (chronic obstructive pulmonary disease)   . Angina   . Shortness of breath     "can come on at anytime" (11/09/2012)  . History of blood transfusion     "I've had 2; don't know what it was related to" (11/09/2012)  . Rotator cuff injury     "left arm; never repaired" (11/09/2012)  . Headache(784.0)     Hx: of  . Cancer     Hx: of prostate   Past Surgical History  Procedure Laterality Date  . Iliac artery aneurysm repair    .  Circumcision    . Pr vein bypass graft,aorto-fem-pop Left 10/01/2009    left below knee popliteal artery to posterior tibial artery  . Cataract extraction w/ intraocular lens implant Right ~ 2008  . Coronary angioplasty with stent placement  2014    "1" (11/09/2012)  . Left heart cath  05-31-12  . Cardiovascular stress test  Aug. 2014  . Shoulder arthroscopy with open rotator cuff repair and distal clavicle acrominectomy Left 01/12/2013    Procedure: LEFT SHOULDER ARTHROSCOPY WITH DEBRIDEMENT OPEN DISTAL CLAVICLE RESECTION ,acromioplastyAND ROTATOR CUFF REPAIR;  Surgeon: Yvette Rack., MD;  Location: [place];  Service: Orthopedics;  Laterality: Left;  . Left heart catheterization with coronary angiogram N/A 06/01/2011    Procedure: LEFT HEART CATHETERIZATION WITH CORONARY ANGIOGRAM;  Surgeon: Hillary Bow, MD;  Location: [facility] CATH LAB;  Service: Cardiovascular;  Laterality: N/A;  . Left heart catheterization with coronary angiogram N/A 05/31/2012    Procedure: LEFT HEART CATHETERIZATION WITH CORONARY ANGIOGRAM;  Surgeon: Peter M Martinique, MD;  Location: [facility] CATH LAB;  Service: Cardiovascular;  Laterality: N/A;  . Percutaneous coronary stent intervention (pci-s)  05/31/2012    Procedure: PERCUTANEOUS CORONARY STENT INTERVENTION (PCI-S);  Surgeon: Peter M Martinique, MD;  Location: [facility] CATH LAB;  Service: Cardiovascular;;  . Left heart catheterization with coronary angiogram N/A 08/01/2013    Procedure: LEFT HEART  CATHETERIZATION WITH CORONARY ANGIOGRAM;  Surgeon: Burnell Blanks, MD;  Location: [facility] CATH LAB;  Service: Cardiovascular;  Laterality: N/A;   Family History  Problem Relation Age of Onset  . Breast cancer Mother     deceased  . Cancer Mother   . Hyperlipidemia Mother   . Hypertension Mother   . COPD Father     deceased  . Cancer Father   . Hyperlipidemia Father   . Hypertension Father   . Hypertension Sister   . Cancer Brother   . Hypertension Brother   . Hypertension Daughter   .  Hypertension Son    History  Substance Use Topics  . Smoking status: Former Smoker -- 1.00 packs/day for 55 years    Types: Cigarettes    Quit date: 05/26/2011  . Smokeless tobacco: Never Used  . Alcohol Use: No    Review of Systems  Neurological: Positive for dizziness and headaches.  All other systems reviewed and are negative.     Allergies  Atenolol; Imdur; Lisinopril; and Topiramate  Home Medications   Prior to Admission medications   Medication Sig Start Date End Date Taking? Authorizing Provider  albuterol (PROVENTIL HFA;VENTOLIN HFA) 108 (90 BASE) MCG/ACT inhaler Inhale 2 puffs into the lungs 2 (two) times daily as needed for wheezing or shortness of breath.     Historical Provider, MD  amLODipine (NORVASC) 5 MG tablet Take 5 mg by mouth daily.    Historical Provider, MD  aspirin EC 81 MG tablet Take 81 mg by mouth daily.    Historical Provider, MD  hydrochlorothiazide (HYDRODIURIL) 25 MG tablet Take 25 mg by mouth daily. 02/15/14   Historical Provider, MD  Iron TABS Take 1 tablet by mouth daily.     Historical Provider, MD  losartan-hydrochlorothiazide (HYZAAR) 100-25 MG per tablet Take 1 tablet by mouth daily. 04/17/14   Burnell Blanks, MD  nitroGLYCERIN (NITROSTAT) 0.4 MG SL tablet Place 1 tablet (0.4 mg total) under the tongue every 5 (five) minutes as needed for chest pain. 06/01/12   Rhonda G Barrett, PA-C  omeprazole (PRILOSEC) 20 MG capsule Take 1 capsule (20 mg total) by mouth 2 (two) times daily. 06/29/12   Hillary Bow, MD  potassium chloride (K-DUR,KLOR-CON) 10 MEQ tablet take 1 tablet by mouth once daily 08/02/13   Burnell Blanks, MD  pravastatin (PRAVACHOL) 40 MG tablet Take 1 tablet (40 mg total) by mouth every evening. 03/04/14   Burnell Blanks, MD  traMADol (ULTRAM) 50 MG tablet Take 50-100 mg by mouth 3 (three) times daily as needed for pain. For pain. Maximum dose= 8 tablets per day 11/10/12   Evelene Croon Barrett, PA-C   BP 134/70 mmHg   Pulse 76  Temp(Src) 97.8 F (36.6 C) (Oral)  Resp 16  SpO2 99% Physical Exam  Constitutional: He is oriented to person, place, and time.  Chronically ill, NAD   HENT:  Head: Normocephalic.  Mouth/Throat: Oropharynx is clear and moist.  Eyes: Conjunctivae and EOM are normal. Pupils are equal, round, and reactive to light.  Neck: Normal range of motion. Neck supple.  Cardiovascular: Normal rate, regular rhythm and normal heart sounds.   Pulmonary/Chest: Effort normal and breath sounds normal. No respiratory distress. He has no wheezes. He has no rales.  Abdominal: Soft. Bowel sounds are normal. He exhibits no distension. There is no tenderness. There is no rebound.  Musculoskeletal: Normal range of motion. He exhibits no edema or tenderness.  Neurological: He is alert and  oriented to person, place, and time. No cranial nerve deficit. Coordination normal.  CN 2-12 intact. Nl strength throughout. Nl finger to nose. No pronator drift. Steady gait, not wide based   Skin: Skin is warm and dry.  Psychiatric: He has a normal mood and affect. His behavior is normal. Judgment and thought content normal.  Nursing note and vitals reviewed.   ED Course  Procedures (including critical care time) Labs Review Labs Reviewed  CBC WITH DIFFERENTIAL/PLATELET - Abnormal; Notable for the following:    Hemoglobin 12.1 (*)    MCV 69.4 (*)    MCH 21.5 (*)    RDW 17.8 (*)    Neutrophils Relative % 78 (*)    All other components within normal limits  COMPREHENSIVE METABOLIC PANEL - Abnormal; Notable for the following:    Glucose, Bld 151 (*)    Creatinine, Ser 1.41 (*)    GFR calc non Af Amer 48 (*)    GFR calc Af Amer 55 (*)    All other components within normal limits  I-STAT TROPOININ, ED    Imaging Review Ct Head Wo Contrast  05/04/2014   CLINICAL DATA:  Dizziness, headache, hypertension  EXAM: CT HEAD WITHOUT CONTRAST  TECHNIQUE: Contiguous axial images were obtained from the base of the  skull through the vertex without intravenous contrast.  COMPARISON:  06/11/2011  FINDINGS: No skull fracture is noted. Paranasal sinuses and mastoid air cells are unremarkable.  No intracranial hemorrhage, mass effect or midline shift. No acute cortical infarction. 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 Mild cerebral atrophy. Mild periventricular white matter decreased attenuation probable due to chronic small vessel ischemic changes. No mass lesion is noted on this unenhanced scan.  IMPRESSION: No acute intracranial abnormality. Mild cerebral atrophy. 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   Electronically Signed   By: Lahoma Crocker M.D.   On: 05/04/2014 16:27     EKG Interpretation   Date/Time:  Saturday May 04 2014 15:08:41 EST Ventricular Rate:  73 PR Interval:  192 QRS Duration: 104 QT Interval:  378 QTC Calculation: 416 R Axis:   48 Text Interpretation:  Normal sinus rhythm Incomplete right bundle branch  block Borderline ECG No significant change since last tracing Confirmed by  Mertie Haslem  MD, Orla Jolliff ([id number]) on 05/04/2014 3:18:40 PM      MDM   Final diagnoses:  Dizziness   Joel [name] is a 74 y.o. male here with dizziness. Not vertigo or near syncope. I think likely symptomatic hypertension. Will check labs, CT head. Nl neuro exam so I don't need MRI to r/o posterior stroke. He is maxed out on Hyzaar but can get extra norvasc. Will give migraine cocktail as well.   4:44 PM Cr slightly more elevated. Was 1.3 before now 1.4. CT head showed old temporal lobe stroke. BP improved with norvasc. I think symptomatic hypertension vs intermittent dizziness from previous temporal stroke. Will increase norvasc and recheck BMP in a week.    Wandra Arthurs, MD 05/04/14 [id number]

## 2014-06-04 ENCOUNTER — Encounter: Payer: PPO | Attending: Internal Medicine

## 2014-06-04 VITALS — Ht 70.0 in | Wt 255.9 lb

## 2014-06-04 DIAGNOSIS — Z713 Dietary counseling and surveillance: Secondary | ICD-10-CM | POA: Diagnosis present

## 2014-06-04 DIAGNOSIS — E119 Type 2 diabetes mellitus without complications: Secondary | ICD-10-CM

## 2014-06-05 NOTE — Progress Notes (Signed)

## 2014-06-06 ENCOUNTER — Telehealth: Payer: Self-pay | Admitting: Cardiovascular Disease

## 2014-06-06 NOTE — Telephone Encounter (Signed)
Pt has had CP on and off for the past few days.  Pt has not taken Nitro as pt state "pain has not been that bad". When asked if there is anything that make the pain better or worse, the pt stated he takes his Tramadol and that usually helps it.  Pt stated he wants to see Dr. Angelena Form before scheduled appointment on 3/8. Told pt if he felt he needed to be see we could set him up with one of our PA's and he was not interested.  Educated pt to take his nitro and to call 911 if needed.  Pt stated he just want an earlier appointment with dr. Angelena Form and hung up.

## 2014-06-06 NOTE — Telephone Encounter (Signed)
Pt c/o of Chest Pain: 1. Are you having CP right now? No  2. Are you experiencing any other symptoms (ex. SOB, nausea, vomiting, sweating)? No  3. How long have you been experiencing CP? 2-3 days  4. Is your CP continuous or coming and going? Coming and going  5. Have you taken Nitroglycerin? No, he hasnt taken any of that.. Please call

## 2014-06-11 ENCOUNTER — Encounter: Payer: PPO | Attending: Internal Medicine

## 2014-06-11 DIAGNOSIS — E119 Type 2 diabetes mellitus without complications: Secondary | ICD-10-CM | POA: Diagnosis not present

## 2014-06-11 DIAGNOSIS — Z713 Dietary counseling and surveillance: Secondary | ICD-10-CM | POA: Insufficient documentation

## 2014-06-11 NOTE — Progress Notes (Signed)

## 2014-06-18 ENCOUNTER — Ambulatory Visit (INDEPENDENT_AMBULATORY_CARE_PROVIDER_SITE_OTHER): Payer: PPO | Admitting: Cardiovascular Disease

## 2014-06-18 ENCOUNTER — Encounter: Payer: Self-pay | Admitting: Cardiovascular Disease

## 2014-06-18 VITALS — BP 119/70 | HR 64 | Ht 70.0 in | Wt 250.0 lb

## 2014-06-18 DIAGNOSIS — E785 Hyperlipidemia, unspecified: Secondary | ICD-10-CM

## 2014-06-18 DIAGNOSIS — I10 Essential (primary) hypertension: Secondary | ICD-10-CM

## 2014-06-18 DIAGNOSIS — E119 Type 2 diabetes mellitus without complications: Secondary | ICD-10-CM

## 2014-06-18 DIAGNOSIS — Z713 Dietary counseling and surveillance: Secondary | ICD-10-CM | POA: Diagnosis not present

## 2014-06-18 DIAGNOSIS — I2510 Atherosclerotic heart disease of native coronary artery without angina pectoris: Secondary | ICD-10-CM

## 2014-06-18 NOTE — Progress Notes (Signed)
History of Present Illness: 74 yo male with history of HTN, HLD, CAD, PAD, DM who is here today for cardiac follow up. He has been followed in the past by Dr. Lia Foyer. Cardiac cath February 2014 with moderate LAD and RCA disease, severe OM2 stenosis treated with bare metal stent. Admitted to [facility] 11/09/12 with chest pain. Ruled out for MI. Lexiscan stress myoview 11/16/12 without ischemia. Normal LVEF.  I saw him in the office 07/27/13 and he c/o exertional chest pain c/w unstable angina. Cardiac cath 08/01/13 with patent stent Circumflex and mild to moderate disease in the LAD and RCA. His PAD is followed in [facility] by Dr. Scot Dock. Seen in the ED by Dr. Ron Parker 12/15/13 with c/o chest pain. Negative troponin and discharged home. He was seen here 03/04/14 and had c/o chest pain after working on his car but quickly resolved.   He is here today for follow up. He has occasional mild, sharp chest pains that happen several days per week. These pains last for a few seconds. Seems to be worse with movement of left arm. Also improves with belching. No associated SOB, dizziness, diaphoresis.   Primary Care Physician:  Seward Carol  Last Lipid Profile: Followed in primary care.    Past Medical History  Diagnosis Date  . GERD (gastroesophageal reflux disease)   . HLD (hyperlipidemia)   . HTN (hypertension)   . Cough secondary to angiotensin converting enzyme inhibitor (ACE-I)   . Peripheral vascular disease   . CAD (coronary artery disease)     A. Negative MV 04/2010;  B. Inadequate Stress Echo 05/2011;  C. 05/29/11 Cath: - Med Rx;  D. 05/2012 Cath/PCI: LM nl, LAD 40p, LCX ectatic, OM1 125m, OM2 90p (3.5x16 Veriflex BMS), RCA dominant 40p/d, PDA 50p, EF 55-65%.  . Iron deficiency anemia   . DVT (deep venous thrombosis)   . Microcytic anemia   . COPD (chronic obstructive pulmonary disease)   . Angina   . Shortness of breath     "can come on at anytime" (11/09/2012)  . History of blood transfusion     "I've had  2; don't know what it was related to" (11/09/2012)  . Rotator cuff injury     "left arm; never repaired" (11/09/2012)  . Headache(784.0)     Hx: of  . Cancer     Hx: of prostate  . Diabetes mellitus without complication     Past Surgical History  Procedure Laterality Date  . Iliac artery aneurysm repair    . Circumcision    . Pr vein bypass graft,aorto-fem-pop Left 10/01/2009    left below knee popliteal artery to posterior tibial artery  . Cataract extraction w/ intraocular lens implant Right ~ 2008  . Coronary angioplasty with stent placement  2014    "1" (11/09/2012)  . Left heart cath  05-31-12  . Cardiovascular stress test  Aug. 2014  . Shoulder arthroscopy with open rotator cuff repair and distal clavicle acrominectomy Left 01/12/2013    Procedure: LEFT SHOULDER ARTHROSCOPY WITH DEBRIDEMENT OPEN DISTAL CLAVICLE RESECTION ,acromioplastyAND ROTATOR CUFF REPAIR;  Surgeon: Yvette Rack., MD;  Location: [place];  Service: Orthopedics;  Laterality: Left;  . Left heart catheterization with coronary angiogram N/A 06/01/2011    Procedure: LEFT HEART CATHETERIZATION WITH CORONARY ANGIOGRAM;  Surgeon: Hillary Bow, MD;  Location: [facility] CATH LAB;  Service: Cardiovascular;  Laterality: N/A;  . Left heart catheterization with coronary angiogram N/A 05/31/2012    Procedure: LEFT HEART  CATHETERIZATION WITH CORONARY ANGIOGRAM;  Surgeon: Peter M Martinique, MD;  Location: [facility] CATH LAB;  Service: Cardiovascular;  Laterality: N/A;  . Percutaneous coronary stent intervention (pci-s)  05/31/2012    Procedure: PERCUTANEOUS CORONARY STENT INTERVENTION (PCI-S);  Surgeon: Peter M Martinique, MD;  Location: [facility] C[facility] LAB;  Service: Cardiovascular;;  . Left heart catheterization with coronary angiogram N/A 08/01/2013    Procedure: LEFT HEART CATHETERIZATION WITH CORONARY ANGIOGRAM;  Surgeon: Burnell Blanks, MD;  Location: [facility] CATH LAB;  Service: Cardiovascular;  Laterality: N/A;    Current Outpatient Prescriptions    Medication Sig Dispense Refill  . amLODipine (NORVASC) 10 MG tablet Take 1 tablet (10 mg total) by mouth daily. 30 tablet 0  . aspirin EC 81 MG tablet Take 81 mg by mouth 2 (two) times daily.     . hydrochlorothiazide (HYDRODIURIL) 25 MG tablet Take 25 mg by mouth daily.  0  . Iron TABS Take 1 tablet by mouth daily.     Marland Kitchen losartan-hydrochlorothiazide (HYZAAR) 100-25 MG per tablet Take 1 tablet by mouth daily. 30 tablet 4  . metFORMIN (GLUCOPHAGE) 500 MG tablet Take 500 mg by mouth 2 (two) times daily.  0  . omeprazole (PRILOSEC) 20 MG capsule Take 1 capsule (20 mg total) by mouth 2 (two) times daily.    . potassium chloride (K-DUR,KLOR-CON) 10 MEQ tablet take 1 tablet by mouth once daily 30 tablet 1  . pravastatin (PRAVACHOL) 40 MG tablet Take 1 tablet (40 mg total) by mouth every evening. 30 tablet 11  . albuterol (PROVENTIL HFA;VENTOLIN HFA) 108 (90 BASE) MCG/ACT inhaler Inhale 2 puffs into the lungs 2 (two) times daily as needed for wheezing or shortness of breath.     . meclizine (ANTIVERT) 12.5 MG tablet Take 1 tablet (12.5 mg total) by mouth 3 (three) times daily as needed for dizziness. (Patient not taking: Reported on 06/18/2014) 20 tablet 0  . nitroGLYCERIN (NITROSTAT) 0.4 MG SL tablet Place 1 tablet (0.4 mg total) under the tongue every 5 (five) minutes as needed for chest pain. (Patient not taking: Reported on 06/18/2014) 25 tablet 3  . traMADol (ULTRAM) 50 MG tablet Take 50-100 mg by mouth 3 (three) times daily as needed for moderate pain. For pain. Maximum dose= 8 tablets per day     No current facility-administered medications for this visit.    Allergies  Allergen Reactions  . Atenolol     bradycardia  . Imdur [Isosorbide Mononitrate] Nausea Only and Other (See Comments)    Headache  . Lisinopril Other (See Comments)    Cough  . Topiramate     Shortness of breath and leg cramps    History   Social History  . Marital Status: Widowed    Spouse Name: N/A  . Number of  Children: N/A  . Years of Education: N/A   Occupational History  . Retired    Social History Main Topics  . Smoking status: Former Smoker -- 1.00 packs/day for 55 years    Types: Cigarettes    Quit date: 05/26/2011  . Smokeless tobacco: Never Used  . Alcohol Use: No  . Drug Use: No  . Sexual Activity: Not on file     Comment: 11/09/2012 "I don't have to answer questions about sex"   Other Topics Concern  . Not on file   Social History Narrative   Single. Retired. 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  Lives in [place] by himself.    Family History  Problem Relation Age  of Onset  . Breast cancer Mother     deceased  . Cancer Mother   . Hyperlipidemia Mother   . Hypertension Mother   . COPD Father     deceased  . Cancer Father   . Hyperlipidemia Father   . Hypertension Father   . Hypertension Sister   . Cancer Brother   . Hypertension Brother   . Hypertension Daughter   . Hypertension Son     Review of Systems:  As stated in the HPI and otherwise negative.   BP 119/70 mmHg  Pulse 64  Ht 5\' 10"  (1.778 m)  Wt 250 lb (113.399 kg)  BMI 35.87 kg/m2  Physical Examination: General: Well developed, well nourished, NAD HEENT: OP clear, mucus membranes moist SKIN: warm, dry. No rashes. Neuro: No focal deficits Musculoskeletal: Muscle strength 5/5 all ext Psychiatric: Mood and affect normal Neck: No JVD, no carotid bruits, no thyromegaly, no lymphadenopathy. Lungs:Clear bilaterally, no wheezes, rhonci, crackles Cardiovascular: Regular rate and rhythm. No murmurs, gallops or rubs. Abdomen:Soft. Bowel sounds present. Non-tender.  Extremities: No lower extremity edema. Pulses are 2 + in the bilateral DP/PT.  Cardiac cath 08/01/13: Left main: No obstructive disease.  Left Anterior Descending Artery: Large caliber vessel that courses to the apex. The proximal and mid vessel is ectatic. There is diffuse 30-40% stenosis in the mid vessel. There are mild luminal irregularities in the  distal vessel.  Circumflex Artery: Large caliber vessel with two obtuse marginal branches. The first OM branch is small in caliber and is chronically occluded in the mid vessel. The second OM branch is patent with patent stent, minimal restenosis. The distal segment of the OM branch has diffuse plaque.  Right Coronary Artery: Large caliber dominant vessel with proximal Shepherd's crook. The proximal vessel has diffuse 40% stenosis. The mid vessel is ectatic with 40% stenosis. The distal vessel has diffuse 50% stenosis. The PDA has 50% proximal stenosis followed by diffuse plaque.  Left Ventricular Angiogram: LVEF=65%.   EKG:   Assessment and Plan:   1. CAD: Stable. The chest pain he is having does not sound cardiac related. Most likely GERD vs musculoskeletal pain. He does not tolerate Imdur secondary to severe headache. Will continue medical management of CAD with ASA, amlodipine, Losartan, statin.   2. HTN: BP controlled today. No changes.   3. Hyperlipidemia: Continue statin. He has been on Pravastatin 40 mg daily and tolerating.

## 2014-06-18 NOTE — Progress Notes (Signed)
Patient was seen on 06/18/2014 for the third of a series of three diabetes self-management courses at the [facility]. The following learning objectives were met by the patient during this class:  . State the amount of activity recommended for healthy living . Describe activities suitable for individual needs . Identify ways to regularly incorporate activity into daily life . Identify barriers to activity and ways to over come these barriers  Identify diabetes medications being personally used and their primary action for lowering glucose and possible side effects . Describe role of stress on blood glucose and develop strategies to address psychosocial issues . Identify diabetes complications and ways to prevent them  Explain how to manage diabetes during illness . Evaluate success in meeting personal goal . Establish 2-3 goals that they will plan to diligently work on until they return for the  15-month follow-up visit  Goals:   I will count my carb choices at most meals and snacks  I will take my diabetes medications as scheduled  Your patient has identified these potential barriers to change:  None stated  Your patient has identified their diabetes self-care support plan as  None stated Plan:  Attend Core 4 in 4 months

## 2014-06-18 NOTE — Patient Instructions (Signed)
Your physician wants you to follow-up in:  6 months. You will receive a reminder letter in the mail two months in advance. If you don't receive a letter, please call our office to schedule the follow-up appointment.   

## 2014-07-01 ENCOUNTER — Encounter (HOSPITAL_COMMUNITY): Payer: Self-pay

## 2014-07-01 ENCOUNTER — Emergency Department (HOSPITAL_COMMUNITY)
Admission: EM | Admit: 2014-07-01 | Discharge: 2014-07-01 | Disposition: A | Discharge status: Home / Self Care | Payer: PPO | Attending: Emergency Medicine | Admitting: Emergency Medicine

## 2014-07-01 ENCOUNTER — Emergency Department (HOSPITAL_COMMUNITY): Payer: PPO

## 2014-07-01 DIAGNOSIS — R200 Anesthesia of skin: Secondary | ICD-10-CM | POA: Insufficient documentation

## 2014-07-01 DIAGNOSIS — Z87828 Personal history of other (healed) physical injury and trauma: Secondary | ICD-10-CM | POA: Insufficient documentation

## 2014-07-01 DIAGNOSIS — R079 Chest pain, unspecified: Secondary | ICD-10-CM | POA: Diagnosis not present

## 2014-07-01 DIAGNOSIS — Z862 Personal history of diseases of the blood and blood-forming organs and certain disorders involving the immune mechanism: Secondary | ICD-10-CM | POA: Diagnosis not present

## 2014-07-01 DIAGNOSIS — Z7982 Long term (current) use of aspirin: Secondary | ICD-10-CM | POA: Insufficient documentation

## 2014-07-01 DIAGNOSIS — Z86718 Personal history of other venous thrombosis and embolism: Secondary | ICD-10-CM | POA: Insufficient documentation

## 2014-07-01 DIAGNOSIS — M79602 Pain in left arm: Secondary | ICD-10-CM | POA: Insufficient documentation

## 2014-07-01 DIAGNOSIS — E119 Type 2 diabetes mellitus without complications: Secondary | ICD-10-CM | POA: Insufficient documentation

## 2014-07-01 DIAGNOSIS — I2510 Atherosclerotic heart disease of native coronary artery without angina pectoris: Secondary | ICD-10-CM | POA: Diagnosis not present

## 2014-07-01 DIAGNOSIS — Z9889 Other specified postprocedural states: Secondary | ICD-10-CM | POA: Diagnosis not present

## 2014-07-01 DIAGNOSIS — Z79899 Other long term (current) drug therapy: Secondary | ICD-10-CM | POA: Diagnosis not present

## 2014-07-01 DIAGNOSIS — I10 Essential (primary) hypertension: Secondary | ICD-10-CM | POA: Insufficient documentation

## 2014-07-01 DIAGNOSIS — J449 Chronic obstructive pulmonary disease, unspecified: Secondary | ICD-10-CM | POA: Insufficient documentation

## 2014-07-01 DIAGNOSIS — Z87891 Personal history of nicotine dependence: Secondary | ICD-10-CM | POA: Diagnosis not present

## 2014-07-01 DIAGNOSIS — Z859 Personal history of malignant neoplasm, unspecified: Secondary | ICD-10-CM | POA: Insufficient documentation

## 2014-07-01 DIAGNOSIS — K219 Gastro-esophageal reflux disease without esophagitis: Secondary | ICD-10-CM | POA: Insufficient documentation

## 2014-07-01 DIAGNOSIS — E785 Hyperlipidemia, unspecified: Secondary | ICD-10-CM | POA: Insufficient documentation

## 2014-07-01 DIAGNOSIS — Z9861 Coronary angioplasty status: Secondary | ICD-10-CM | POA: Insufficient documentation

## 2014-07-01 LAB — CBC
HCT: 36.4 % — ABNORMAL LOW (ref 39.0–52.0)
Hemoglobin: 11.3 g/dL — ABNORMAL LOW (ref 13.0–17.0)
MCH: 21.3 pg — ABNORMAL LOW (ref 26.0–34.0)
MCHC: 31 g/dL (ref 30.0–36.0)
MCV: 68.7 fL — ABNORMAL LOW (ref 78.0–100.0)
Platelets: 153 10*3/uL (ref 150–400)
RBC: 5.3 MIL/uL (ref 4.22–5.81)
RDW: 16.8 % — ABNORMAL HIGH (ref 11.5–15.5)
WBC: 3.7 10*3/uL — ABNORMAL LOW (ref 4.0–10.5)

## 2014-07-01 LAB — COMPREHENSIVE METABOLIC PANEL
ALT: 20 U/L (ref 0–53)
AST: 18 U/L (ref 0–37)
Albumin: 3.8 g/dL (ref 3.5–5.2)
Alkaline Phosphatase: 68 U/L (ref 39–117)
Anion gap: 7 (ref 5–15)
BUN: 7 mg/dL (ref 6–23)
CO2: 33 mmol/L — ABNORMAL HIGH (ref 19–32)
Calcium: 9.1 mg/dL (ref 8.4–10.5)
Chloride: 98 mmol/L (ref 96–112)
Creatinine, Ser: 1.31 mg/dL (ref 0.50–1.35)
GFR calc Af Amer: 60 mL/min — ABNORMAL LOW (ref >90)
GFR calc non Af Amer: 52 mL/min — ABNORMAL LOW (ref >90)
Glucose, Bld: 96 mg/dL (ref 70–99)
Potassium: 3.6 mmol/L (ref 3.5–5.1)
Sodium: 138 mmol/L (ref 135–145)
Total Bilirubin: 0.5 mg/dL (ref 0.3–1.2)
Total Protein: 6.4 g/dL (ref 6.0–8.3)

## 2014-07-01 LAB — I-STAT TROPONIN, ED: Troponin i, poc: 0 ng/mL (ref 0.00–0.08)

## 2014-07-01 LAB — TROPONIN I: Troponin I: <0.03 ng/mL (ref <0.031)

## 2014-07-01 MED ORDER — IBUPROFEN 200 MG PO TABS
600.0000 mg | ORAL_TABLET | Freq: Once | ORAL | Status: AC
  Administered 2014-07-01: 600 mg via ORAL
  Filled 2014-07-01 (×2): qty 1

## 2014-07-01 MED ORDER — OXYCODONE-ACETAMINOPHEN 5-325 MG PO TABS: 1.0000 | ORAL_TABLET | Freq: Once | ORAL | Status: DC

## 2014-07-01 MED ORDER — HYDROCODONE-ACETAMINOPHEN 5-325 MG PO TABS: 1.0000 | ORAL_TABLET | ORAL | Status: DC | PRN

## 2014-07-01 MED ORDER — IBUPROFEN 600 MG PO TABS: 600.0000 mg | ORAL_TABLET | Freq: Three times a day (TID) | ORAL | Status: DC | PRN

## 2014-07-01 NOTE — ED Provider Notes (Signed)
CSN: [id number]     Arrival date & time 07/01/14  0901 History   First MD Initiated Contact with Patient 07/01/14 0920     Chief Complaint  Patient presents with  . Arm Pain  . Numbness     The history is provided by the patient.   Patient presents emergency department with what was initially intermittent left arm numbness and tingling with some radiation up into his left anterior chest and left shoulder.  Over the past 12 hours his symptoms of been constant.  He does have a history of rotator cuff surgery but states this feels different.  He does have a history of coronary disease as well but also reports that this feels different.  He's tried Goody powder without any improvement in the symptoms.  Occasional shortness of breath but no shortness of breath at this time.  No unilateral leg swelling.  No pleuritic chest pain.   Past Medical History  Diagnosis Date  . GERD (gastroesophageal reflux disease)   . HLD (hyperlipidemia)   . HTN (hypertension)   . Cough secondary to angiotensin converting enzyme inhibitor (ACE-I)   . Peripheral vascular disease   . CAD (coronary artery disease)     A. Negative MV 04/2010;  B. Inadequate Stress Echo 05/2011;  C. 05/29/11 Cath: - Med Rx;  D. 05/2012 Cath/PCI: LM nl, LAD 40p, LCX ectatic, OM1 156m, OM2 90p (3.5x16 Veriflex BMS), RCA dominant 40p/d, PDA 50p, EF 55-65%.  . Iron deficiency anemia   . DVT (deep venous thrombosis)   . Microcytic anemia   . COPD (chronic obstructive pulmonary disease)   . Angina   . Shortness of breath     "can come on at anytime" (11/09/2012)  . History of blood transfusion     "I've had 2; don't know what it was related to" (11/09/2012)  . Rotator cuff injury     "left arm; never repaired" (11/09/2012)  . Headache(784.0)     Hx: of  . Cancer     Hx: of prostate  . Diabetes mellitus without complication    Past Surgical History  Procedure Laterality Date  . Iliac artery aneurysm repair    . Circumcision    . Pr  vein bypass graft,aorto-fem-pop Left 10/01/2009    left below knee popliteal artery to posterior tibial artery  . Cataract extraction w/ intraocular lens implant Right ~ 2008  . Coronary angioplasty with stent placement  2014    "1" (11/09/2012)  . Left heart cath  05-31-12  . Cardiovascular stress test  Aug. 2014  . Shoulder arthroscopy with open rotator cuff repair and distal clavicle acrominectomy Left 01/12/2013    Procedure: LEFT SHOULDER ARTHROSCOPY WITH DEBRIDEMENT OPEN DISTAL CLAVICLE RESECTION ,acromioplastyAND ROTATOR CUFF REPAIR;  Surgeon: Yvette Rack., MD;  Location: [place];  Service: Orthopedics;  Laterality: Left;  . Left heart catheterization with coronary angiogram N/A 06/01/2011    Procedure: LEFT HEART CATHETERIZATION WITH CORONARY ANGIOGRAM;  Surgeon: Hillary Bow, MD;  Location: [facility] CATH LAB;  Service: Cardiovascular;  Laterality: N/A;  . Left heart catheterization with coronary angiogram N/A 05/31/2012    Procedure: LEFT HEART CATHETERIZATION WITH CORONARY ANGIOGRAM;  Surgeon: Peter M Martinique, MD;  Location: [facility] CATH LAB;  Service: Cardiovascular;  Laterality: N/A;  . Percutaneous coronary stent intervention (pci-s)  05/31/2012    Procedure: PERCUTANEOUS CORONARY STENT INTERVENTION (PCI-S);  Surgeon: Peter M Martinique, MD;  Location: [facility] CATH LAB;  Service: Cardiovascular;;  . Left heart catheterization  with coronary angiogram N/A 08/01/2013    Procedure: LEFT HEART CATHETERIZATION WITH CORONARY ANGIOGRAM;  Surgeon: Burnell Blanks, MD;  Location: [facility] CATH LAB;  Service: Cardiovascular;  Laterality: N/A;   Family History  Problem Relation Age of Onset  . Breast cancer Mother     deceased  . Cancer Mother   . Hyperlipidemia Mother   . Hypertension Mother   . COPD Father     deceased  . Cancer Father   . Hyperlipidemia Father   . Hypertension Father   . Hypertension Sister   . Cancer Brother   . Hypertension Brother   . Hypertension Daughter   . Hypertension Son     History  Substance Use Topics  . Smoking status: Former Smoker -- 1.00 packs/day for 55 years    Types: Cigarettes    Quit date: 05/26/2011  . Smokeless tobacco: Never Used  . Alcohol Use: No    Review of Systems  All other systems reviewed and are negative.     Allergies  Atenolol; Imdur; Lisinopril; and Topiramate  Home Medications   Prior to Admission medications   Medication Sig Start Date End Date Taking? Authorizing Provider  albuterol (PROVENTIL HFA;VENTOLIN HFA) 108 (90 BASE) MCG/ACT inhaler Inhale 2 puffs into the lungs 2 (two) times daily as needed for wheezing or shortness of breath.     Historical Provider, MD  amLODipine (NORVASC) 10 MG tablet Take 1 tablet (10 mg total) by mouth daily. 05/04/14   Wandra Arthurs, MD  aspirin EC 81 MG tablet Take 81 mg by mouth 2 (two) times daily.     Historical Provider, MD  hydrochlorothiazide (HYDRODIURIL) 25 MG tablet Take 25 mg by mouth daily. 02/15/14   Historical Provider, MD  Iron TABS Take 1 tablet by mouth daily.     Historical Provider, MD  losartan-hydrochlorothiazide (HYZAAR) 100-25 MG per tablet Take 1 tablet by mouth daily. 04/17/14   Burnell Blanks, MD  meclizine (ANTIVERT) 12.5 MG tablet Take 1 tablet (12.5 mg total) by mouth 3 (three) times daily as needed for dizziness. Patient not taking: Reported on 06/18/2014 05/04/14   Wandra Arthurs, MD  metFORMIN (GLUCOPHAGE) 500 MG tablet Take 500 mg by mouth 2 (two) times daily. 06/13/14   Historical Provider, MD  nitroGLYCERIN (NITROSTAT) 0.4 MG SL tablet Place 1 tablet (0.4 mg total) under the tongue every 5 (five) minutes as needed for chest pain. Patient not taking: Reported on 06/18/2014 06/01/12   Evelene Croon Barrett, PA-C  omeprazole (PRILOSEC) 20 MG capsule Take 1 capsule (20 mg total) by mouth 2 (two) times daily. 06/29/12   Hillary Bow, MD  potassium chloride (K-DUR,KLOR-CON) 10 MEQ tablet take 1 tablet by mouth once daily 08/02/13   Burnell Blanks, MD   pravastatin (PRAVACHOL) 40 MG tablet Take 1 tablet (40 mg total) by mouth every evening. 03/04/14   Burnell Blanks, MD  traMADol (ULTRAM) 50 MG tablet Take 50-100 mg by mouth 3 (three) times daily as needed for moderate pain. For pain. Maximum dose= 8 tablets per day 11/10/12   Evelene Croon Barrett, PA-C   BP 138/73 mmHg  Pulse 60  Temp(Src) 97.7 F (36.5 C) (Oral)  Resp 16  SpO2 98% Physical Exam  Constitutional: He is oriented to person, place, and time. He appears well-developed and well-nourished.  HENT:  Head: Normocephalic and atraumatic.  Eyes: EOM are normal.  Neck: Normal range of motion.  Cardiovascular: Normal rate, regular rhythm, normal heart sounds  and intact distal pulses.   Pulmonary/Chest: Effort normal and breath sounds normal. No respiratory distress.  Abdominal: Soft. He exhibits no distension. There is no tenderness.  Musculoskeletal: Normal range of motion.  Full range of motion of left shoulder.  Neurological: He is alert and oriented to person, place, and time.  Skin: Skin is warm and dry.  Psychiatric: He has a normal mood and affect. Judgment normal.  Nursing note and vitals reviewed.   ED Course  Procedures (including critical care time) Labs Review Labs Reviewed  CBC - Abnormal; Notable for the following:    WBC 3.7 (*)    Hemoglobin 11.3 (*)    HCT 36.4 (*)    MCV 68.7 (*)    MCH 21.3 (*)    RDW 16.8 (*)    All other components within normal limits  COMPREHENSIVE METABOLIC PANEL - Abnormal; Notable for the following:    CO2 33 (*)    GFR calc non Af Amer 52 (*)    GFR calc Af Amer 60 (*)    All other components within normal limits  TROPONIN I  I-STAT TROPOININ, ED    Imaging Review Dg Chest 2 View  07/01/2014   CLINICAL DATA:  Upper extremity pain and numbness for 2 days.  EXAM: CHEST  2 VIEW  COMPARISON:  December 15, 2013  FINDINGS: There is no edema or consolidation. The heart size and pulmonary vascularity are normal. No  adenopathy. There is degenerative change in the thoracic spine.  IMPRESSION: No edema or consolidation.   Electronically Signed   By: Lowella Grip III M.D.   On: 07/01/2014 10:23  I personally reviewed the imaging tests through PACS system I reviewed available ER/hospitalization records through the EMR    EKG Interpretation   Date/Time:  Monday July 01 2014 09:19:48 EDT Ventricular Rate:  64 PR Interval:  57 QRS Duration: 90 QT Interval:  420 QTC Calculation: 433 R Axis:   59 Text Interpretation:  Sinus rhythm Short PR interval No significant change  was found Confirmed by Kushal Saunders  MD, Zigmond Trela ([id number]) on 07/01/2014 10:04:14 AM      MDM   Final diagnoses:  Left arm pain  Chest pain, unspecified chest pain type    Atypical chest and arm pain.  Some of this could represent cervical radiculopathy.  My suspicion for ACS is low.  EKG is without ischemic changes.  Troponin is negative 2.  No weakness of his left upper extremity.  Normal left radial pulse.  Denies neck pain.  Denies recent injury or trauma.  Discharge home with primary care follow-up.    Jola Schmidt, MD 07/01/14 [phone]

## 2014-07-01 NOTE — ED Notes (Signed)
Pt placed in gown and in bed. Pt monitored by pulse ox, bp cuff, and 12-lead. 

## 2014-07-01 NOTE — ED Notes (Signed)
Patient says that arm pain and numbness started Saturday. Headache occasionally. Relief of pain with BC powder.

## 2014-07-23 ENCOUNTER — Ambulatory Visit
Admission: RE | Admit: 2014-07-23 | Discharge: 2014-07-23 | Disposition: A | Discharge status: Home / Self Care | Payer: PPO | Source: Ambulatory Visit | Attending: Nurse Practitioner | Admitting: Nurse Practitioner

## 2014-07-23 ENCOUNTER — Other Ambulatory Visit: Payer: Self-pay | Admitting: Nurse Practitioner

## 2014-07-23 DIAGNOSIS — M25561 Pain in right knee: Secondary | ICD-10-CM

## 2014-09-10 ENCOUNTER — Observation Stay (HOSPITAL_COMMUNITY)
Admission: EM | Admit: 2014-09-10 | Discharge: 2014-09-10 | Disposition: A | Discharge status: Home / Self Care | Payer: PPO | Attending: Internal Medicine | Admitting: Internal Medicine

## 2014-09-10 ENCOUNTER — Emergency Department (HOSPITAL_COMMUNITY): Payer: PPO

## 2014-09-10 ENCOUNTER — Encounter (HOSPITAL_COMMUNITY): Payer: Self-pay

## 2014-09-10 DIAGNOSIS — Z7982 Long term (current) use of aspirin: Secondary | ICD-10-CM | POA: Diagnosis not present

## 2014-09-10 DIAGNOSIS — Z86718 Personal history of other venous thrombosis and embolism: Secondary | ICD-10-CM | POA: Insufficient documentation

## 2014-09-10 DIAGNOSIS — R079 Chest pain, unspecified: Secondary | ICD-10-CM | POA: Diagnosis present

## 2014-09-10 DIAGNOSIS — R0789 Other chest pain: Secondary | ICD-10-CM | POA: Diagnosis not present

## 2014-09-10 DIAGNOSIS — I739 Peripheral vascular disease, unspecified: Secondary | ICD-10-CM | POA: Diagnosis not present

## 2014-09-10 DIAGNOSIS — E785 Hyperlipidemia, unspecified: Secondary | ICD-10-CM | POA: Diagnosis not present

## 2014-09-10 DIAGNOSIS — Z87891 Personal history of nicotine dependence: Secondary | ICD-10-CM | POA: Insufficient documentation

## 2014-09-10 DIAGNOSIS — E119 Type 2 diabetes mellitus without complications: Secondary | ICD-10-CM | POA: Insufficient documentation

## 2014-09-10 DIAGNOSIS — J449 Chronic obstructive pulmonary disease, unspecified: Secondary | ICD-10-CM | POA: Insufficient documentation

## 2014-09-10 DIAGNOSIS — I10 Essential (primary) hypertension: Secondary | ICD-10-CM | POA: Insufficient documentation

## 2014-09-10 LAB — BASIC METABOLIC PANEL
Anion gap: 9 (ref 5–15)
BUN: 10 mg/dL (ref 6–20)
CO2: 26 mmol/L (ref 22–32)
Calcium: 8.8 mg/dL — ABNORMAL LOW (ref 8.9–10.3)
Chloride: 100 mmol/L — ABNORMAL LOW (ref 101–111)
Creatinine, Ser: 1.41 mg/dL — ABNORMAL HIGH (ref 0.61–1.24)
GFR calc Af Amer: 55 mL/min — ABNORMAL LOW (ref >60)
GFR calc non Af Amer: 48 mL/min — ABNORMAL LOW (ref >60)
Glucose, Bld: 103 mg/dL — ABNORMAL HIGH (ref 65–99)
Potassium: 3.6 mmol/L (ref 3.5–5.1)
Sodium: 135 mmol/L (ref 135–145)

## 2014-09-10 LAB — CBC
HCT: 35.8 % — ABNORMAL LOW (ref 39.0–52.0)
Hemoglobin: 11.4 g/dL — ABNORMAL LOW (ref 13.0–17.0)
MCH: 21.6 pg — ABNORMAL LOW (ref 26.0–34.0)
MCHC: 31.8 g/dL (ref 30.0–36.0)
MCV: 67.9 fL — ABNORMAL LOW (ref 78.0–100.0)
Platelets: 147 10*3/uL — ABNORMAL LOW (ref 150–400)
RBC: 5.27 MIL/uL (ref 4.22–5.81)
RDW: 17.4 % — ABNORMAL HIGH (ref 11.5–15.5)
WBC: 3.8 10*3/uL — ABNORMAL LOW (ref 4.0–10.5)

## 2014-09-10 LAB — I-STAT TROPONIN, ED
Troponin i, poc: 0 ng/mL (ref 0.00–0.08)
Troponin i, poc: 0.01 ng/mL (ref 0.00–0.08)

## 2014-09-10 MED ORDER — GI COCKTAIL ~~LOC~~
30.0000 mL | Freq: Once | ORAL | Status: AC
  Administered 2014-09-10: 30 mL via ORAL
  Filled 2014-09-10: qty 30

## 2014-09-10 MED ORDER — NITROGLYCERIN 0.4 MG SL SUBL
0.4000 mg | SUBLINGUAL_TABLET | SUBLINGUAL | Status: DC | PRN
Start: 2014-09-10 — End: 2014-09-10

## 2014-09-10 MED ORDER — ASPIRIN 325 MG PO TABS
325.0000 mg | ORAL_TABLET | ORAL | Status: AC
  Administered 2014-09-10: 325 mg via ORAL
  Filled 2014-09-10: qty 1

## 2014-09-10 NOTE — ED Notes (Signed)
Ordered pt heart healthy meal tray. Plan for recycle troponin, consult cardiology.

## 2014-09-10 NOTE — ED Provider Notes (Signed)
CSN: [id number]     Arrival date & time 09/10/14  0354 History   First MD Initiated Contact with Patient 09/10/14 [phone]     Chief Complaint  Patient presents with  . Chest Pain     (Consider location/radiation/quality/duration/timing/severity/associated sxs/prior Treatment) HPI Joel [name] is a 74 year old male with past medical history of GERD, hypertension, CAD, DVT, COPD, angina who presents the ER complaining of chest discomfort. Patient reports having an intermittent substernal chest pain since Friday. Patient reports onset of this pain mostly when he is sitting still, having some alleviation of his pain with walking, moving around. Patient reports yesterday he noticed some mild SOB at rest.  He states all of his symptoms have been at rest, and he has not noticed them at all when he is up and active.  Pt states he has had chest discomfort associated with his heart in the past, however this feels different.  Pt states he drove himself to the ER last night, and sat in his car for several hours to see if the pain went away.  Patient states when walking in to get a drink, he began "feeling woozy" and decided to check in for evaluation.   Past Medical History  Diagnosis Date  . GERD (gastroesophageal reflux disease)   . HLD (hyperlipidemia)   . HTN (hypertension)   . Cough secondary to angiotensin converting enzyme inhibitor (ACE-I)   . Peripheral vascular disease   . CAD (coronary artery disease)     A. Negative MV 04/2010;  B. Inadequate Stress Echo 05/2011;  C. 05/29/11 Cath: - Med Rx;  D. 05/2012 Cath/PCI: LM nl, LAD 40p, LCX ectatic, OM1 160m, OM2 90p (3.5x16 Veriflex BMS), RCA dominant 40p/d, PDA 50p, EF 55-65%.  . Iron deficiency anemia   . DVT (deep venous thrombosis)   . Microcytic anemia   . COPD (chronic obstructive pulmonary disease)   . Angina   . Shortness of breath     "can come on at anytime" (11/09/2012)  . History of blood transfusion     "I've had 2; don't know what it  was related to" (11/09/2012)  . Rotator cuff injury     "left arm; never repaired" (11/09/2012)  . Headache(784.0)     Hx: of  . Cancer     Hx: of prostate  . Diabetes mellitus without complication    Past Surgical History  Procedure Laterality Date  . Iliac artery aneurysm repair    . Circumcision    . Pr vein bypass graft,aorto-fem-pop Left 10/01/2009    left below knee popliteal artery to posterior tibial artery  . Cataract extraction w/ intraocular lens implant Right ~ 2008  . Coronary angioplasty with stent placement  2014    "1" (11/09/2012)  . Left heart cath  05-31-12  . Cardiovascular stress test  Aug. 2014  . Shoulder arthroscopy with open rotator cuff repair and distal clavicle acrominectomy Left 01/12/2013    Procedure: LEFT SHOULDER ARTHROSCOPY WITH DEBRIDEMENT OPEN DISTAL CLAVICLE RESECTION ,acromioplastyAND ROTATOR CUFF REPAIR;  Surgeon: Yvette Rack., MD;  Location: [facility];  Service: Orthopedics;  Laterality: Left;  . Left heart catheterization with coronary angiogram N/A 06/01/2011    Procedure: LEFT HEART CATHETERIZATION WITH CORONARY ANGIOGRAM;  Surgeon: Hillary Bow, MD;  Location: [facility] CATH LAB;  Service: Cardiovascular;  Laterality: N/A;  . Left heart catheterization with coronary angiogram N/A 05/31/2012    Procedure: LEFT HEART CATHETERIZATION WITH CORONARY ANGIOGRAM;  Surgeon: Peter M Martinique, MD;  Location: [facility] CATH LAB;  Service: Cardiovascular;  Laterality: N/A;  . Percutaneous coronary stent intervention (pci-s)  05/31/2012    Procedure: PERCUTANEOUS CORONARY STENT INTERVENTION (PCI-S);  Surgeon: Peter M Martinique, MD;  Location: [facility] CATH LAB;  Service: Cardiovascular;;  . Left heart catheterization with coronary angiogram N/A 08/01/2013    Procedure: LEFT HEART CATHETERIZATION WITH CORONARY ANGIOGRAM;  Surgeon: Burnell Blanks, MD;  Location: [facility] CATH LAB;  Service: Cardiovascular;  Laterality: N/A;   Family History  Problem Relation Age of Onset  . Breast  cancer Mother     deceased  . Cancer Mother   . Hyperlipidemia Mother   . Hypertension Mother   . COPD Father     deceased  . Cancer Father   . Hyperlipidemia Father   . Hypertension Father   . Hypertension Sister   . Cancer Brother   . Hypertension Brother   . Hypertension Daughter   . Hypertension Son    History  Substance Use Topics  . Smoking status: Former Smoker -- 1.00 packs/day for 55 years    Types: Cigarettes    Quit date: 05/26/2011  . Smokeless tobacco: Never Used  . Alcohol Use: No    Review of Systems  Constitutional: Negative for fever.  HENT: Negative for trouble swallowing.   Eyes: Negative for visual disturbance.  Respiratory: Positive for shortness of breath.   Cardiovascular: Positive for chest pain.  Gastrointestinal: Negative for nausea, vomiting and abdominal pain.  Genitourinary: Negative for dysuria.  Musculoskeletal: Negative for neck pain.  Skin: Negative for rash.  Neurological: Negative for dizziness, weakness and numbness.  Psychiatric/Behavioral: Negative.       Allergies  Atenolol; Imdur; Lisinopril; and Topiramate  Home Medications   Prior to Admission medications   Medication Sig Start Date End Date Taking? Authorizing Provider  albuterol (PROVENTIL HFA;VENTOLIN HFA) 108 (90 BASE) MCG/ACT inhaler Inhale 2 puffs into the lungs 2 (two) times daily as needed for wheezing or shortness of breath.    Yes Historical Provider, MD  amLODipine (NORVASC) 10 MG tablet Take 1 tablet (10 mg total) by mouth daily. 05/04/14  Yes Wandra Arthurs, MD  aspirin EC 81 MG tablet Take 81 mg by mouth daily.    Yes Historical Provider, MD  Iron TABS Take 1 tablet by mouth daily.    Yes Historical Provider, MD  losartan-hydrochlorothiazide (HYZAAR) 100-25 MG per tablet Take 1 tablet by mouth daily. 04/17/14  Yes Burnell Blanks, MD  metFORMIN (GLUCOPHAGE) 500 MG tablet Take 500 mg by mouth 2 (two) times daily. 06/13/14  Yes Historical Provider, MD   nitroGLYCERIN (NITROSTAT) 0.4 MG SL tablet Place 1 tablet (0.4 mg total) under the tongue every 5 (five) minutes as needed for chest pain. 06/01/12  Yes Rhonda G Barrett, PA-C  omeprazole (PRILOSEC) 20 MG capsule Take 1 capsule (20 mg total) by mouth 2 (two) times daily. 06/29/12  Yes Hillary Bow, MD  potassium chloride (K-DUR,KLOR-CON) 10 MEQ tablet take 1 tablet by mouth once daily 08/02/13  Yes Burnell Blanks, MD  traMADol (ULTRAM) 50 MG tablet Take 50-100 mg by mouth 3 (three) times daily as needed for moderate pain. For pain. Maximum dose= 8 tablets per day 11/10/12  Yes Rhonda G Barrett, PA-C   BP 130/83 mmHg  Pulse 64  Temp(Src) 97.2 F (36.2 C) (Oral)  Resp 15  Ht 5\' 10"  (1.778 m)  Wt 244 lb (110.678 kg)  BMI 35.01 kg/m2  SpO2 93% Physical Exam  Constitutional:  He is oriented to person, place, and time. He appears well-developed and well-nourished. No distress.  HENT:  Head: Normocephalic and atraumatic.  Mouth/Throat: Oropharynx is clear and moist. No oropharyngeal exudate.  Eyes: Right eye exhibits no discharge. Left eye exhibits no discharge. No scleral icterus.  Neck: Normal range of motion.  Cardiovascular: Normal rate, regular rhythm and normal heart sounds.   No murmur heard. Pulmonary/Chest: Effort normal and breath sounds normal. No respiratory distress.  Abdominal: Soft. There is no tenderness.  Musculoskeletal: Normal range of motion. He exhibits no edema or tenderness.  Neurological: He is alert and oriented to person, place, and time. No cranial nerve deficit. Coordination normal.  Skin: Skin is warm and dry. No rash noted. He is not diaphoretic.  Psychiatric: He has a normal mood and affect.    ED Course  Procedures (including critical care time) Labs Review Labs Reviewed  CBC - Abnormal; Notable for the following:    WBC 3.8 (*)    Hemoglobin 11.4 (*)    HCT 35.8 (*)    MCV 67.9 (*)    MCH 21.6 (*)    RDW 17.4 (*)    Platelets 147 (*)    All  other components within normal limits  BASIC METABOLIC PANEL - Abnormal; Notable for the following:    Chloride 100 (*)    Glucose, Bld 103 (*)    Creatinine, Ser 1.41 (*)    Calcium 8.8 (*)    GFR calc non Af Amer 48 (*)    GFR calc Af Amer 55 (*)    All other components within normal limits  I-STAT TROPOININ, ED  Randolm Idol, ED    Imaging Review Dg Chest Port 1 View  09/10/2014   CLINICAL DATA:  Chest pain, shortness of breath, dizziness since this morning.  EXAM: PORTABLE CHEST - 1 VIEW  COMPARISON:  07/01/2014  FINDINGS: The cardiomediastinal contours are normal. Pulmonary vasculature is normal. No consolidation, pleural effusion, or pneumothorax. No acute osseous abnormalities are seen.  IMPRESSION: No acute pulmonary process.   Electronically Signed   By: Jeb Levering M.D.   On: 09/10/2014 06:29     EKG Interpretation   Date/Time:  Tuesday Sep 10 2014 05:20:39 EDT Ventricular Rate:  51 PR Interval:  68 QRS Duration: 92 QT Interval:  463 QTC Calculation: 426 R Axis:   57 Text Interpretation:  Sinus rhythm Short PR interval RSR' in V1 or V2,  right VCD or RVH No significant change since last tracing Confirmed by  Kathrynn Humble, MD, Thelma Comp [phone]) on 09/10/2014 5:30:42 AM      MDM   Final diagnoses:  Chest discomfort  Atypical chest pain   Pt seen and discussed with Dr. Varney Biles, MD.    Pt is a 74 year old male with moderate ACS/occlusion, diagnosed by heart catheterization approximately one year ago presents to the ER complaining of atypical chest pain. Patient's pain is nonexertional, and actually has some alleviating aspect to it with what patient describes as getting up and moving around. Every episode pain is experienced over the past weekend was at rest. Patient also describes some shortness of breath at rest, however this was mild, and short-lived. No shortness of breath or dyspnea on exertion. Patient did have an episode of what he described as "feeling  woozy" while walking into the ER.  Patient asymptomatic throughout his stay here, up ad lib. through the emergency department without experiencing any symptoms. On multiple exams, patient denies having chest discomfort, dizziness, weakness.  Neuro exam is benign. Do not believe patient's chest discomfort is cardiac or pulmonary in nature due to presentation. No concern for ACS with 2 negative troponins greater than 6 hours from onset of symptoms and symptoms ongoing over the past 3 days. Patient did have mild drop in blood pressure with orthostatic vital signs which could explain some of patient's feelings of lightheadedness, however patient did not experience any headache, blurred vision, dizziness, weakness, lightheadedness, syncope with assessment of orthostatic vital signs. Spoke with patient about this, offered IV fluid therapy, patient states he preferred to rehydrate by mouth at home. Wells criteria 0.0, no concern for PE.  Patient well-appearing, hemodynamically stable, asymptomatic and in no acute distress, patient is stable for discharge at this point. Spoke with cardiology service who agrees to set up appointment as outpatient with patient later today. Strict return precautions discussed with patient, patient verbalizes understanding and agreement of this plan.  BP 130/83 mmHg  Pulse 64  Temp(Src) 97.2 F (36.2 C) (Oral)  Resp 15  Ht 5\' 10"  (1.778 m)  Wt 244 lb (110.678 kg)  BMI 35.01 kg/m2  SpO2 93%  Signed,  Dahlia Bailiff, PA-C 2:53 PM   Dahlia Bailiff, PA-C 09/10/14 1454  Varney Biles, MD 09/15/14 1556

## 2014-09-10 NOTE — ED Notes (Signed)
PA at bedside.

## 2014-09-10 NOTE — Discharge Instructions (Signed)
Follow-up with your cardiologist for your chest discomfort. They should call you today to set up an appointment, contact them this afternoon if you have not heard anything from them. Return to the ER with any severe chest pain, shortness of breath, symptoms worse with walking, sweating, nausea, vomiting, severe dizziness, headache or weakness. Follow-up with your primary care doctor regarding your symptoms of dizziness.   Chest Pain (Nonspecific) It is often hard to give a specific diagnosis for the cause of chest pain. There is always a chance that your pain could be related to something serious, such as a heart attack or a blood clot in the lungs. You need to follow up with your health care provider for further evaluation. CAUSES   Heartburn.  Pneumonia or bronchitis.  Anxiety or stress.  Inflammation around your heart (pericarditis) or lung (pleuritis or pleurisy).  A blood clot in the lung.  A collapsed lung (pneumothorax). It can develop suddenly on its own (spontaneous pneumothorax) or from trauma to the chest.  Shingles infection (herpes zoster virus). The chest wall is composed of bones, muscles, and cartilage. Any of these can be the source of the pain.  The bones can be bruised by injury.  The muscles or cartilage can be strained by coughing or overwork.  The cartilage can be affected by inflammation and become sore (costochondritis). DIAGNOSIS  Lab tests or other studies may be needed to find the cause of your pain. Your health care provider may have you take a test called an ambulatory electrocardiogram (ECG). An ECG records your heartbeat patterns over a 24-hour period. You may also have other tests, such as:  Transthoracic echocardiogram (TTE). During echocardiography, sound waves are used to evaluate how blood flows through your heart.  Transesophageal echocardiogram (TEE).  Cardiac monitoring. This allows your health care provider to monitor your heart rate and  rhythm in real time.  Holter monitor. This is a portable device that records your heartbeat and can help diagnose heart arrhythmias. It allows your health care provider to track your heart activity for several days, if needed.  Stress tests by exercise or by giving medicine that makes the heart beat faster. TREATMENT   Treatment depends on what may be causing your chest pain. Treatment may include:  Acid blockers for heartburn.  Anti-inflammatory medicine.  Pain medicine for inflammatory conditions.  Antibiotics if an infection is present.  You may be advised to change lifestyle habits. This includes stopping smoking and avoiding alcohol, caffeine, and chocolate.  You may be advised to keep your head raised (elevated) when sleeping. This reduces the chance of acid going backward from your stomach into your esophagus. Most of the time, nonspecific chest pain will improve within 2-3 days with rest and mild pain medicine.  HOME CARE INSTRUCTIONS   If antibiotics were prescribed, take them as directed. Finish them even if you start to feel better.  For the next few days, avoid physical activities that bring on chest pain. Continue physical activities as directed.  Do not use any tobacco products, including cigarettes, chewing tobacco, or electronic cigarettes.  Avoid drinking alcohol.  Only take medicine as directed by your health care provider.  Follow your health care provider's suggestions for further testing if your chest pain does not go away.  Keep any follow-up appointments you made. If you do not go to an appointment, you could develop lasting (chronic) problems with pain. If there is any problem keeping an appointment, call to reschedule. [place]  IF:   Your chest pain does not go away, even after treatment.  You have a rash with blisters on your chest.  You have a fever. SEEK IMMEDIATE MEDICAL CARE IF:   You have increased chest pain or pain that spreads to  your arm, neck, jaw, back, or abdomen.  You have shortness of breath.  You have an increasing cough, or you cough up blood.  You have severe back or abdominal pain.  You feel nauseous or vomit.  You have severe weakness.  You faint.  You have chills. This is an emergency. Do not wait to see if the pain will go away. Get medical help at once. Call your local emergency services (911 in [place] Do not drive yourself to the hospital. MAKE SURE YOU:   Understand these instructions.  Will watch your condition.  Will get help right away if you are not doing well or get worse. Document Released: 01/06/2005 Document Revised: 04/03/2013 Document Reviewed: 11/02/2007 [facility] Patient Information 2015 [place], [place]. This information is not intended to replace advice given to you by your health care provider. Make sure you discuss any questions you have with your health care provider.  Dizziness Dizziness is a common problem. It is a feeling of unsteadiness or light-headedness. You may feel like you are about to faint. Dizziness can lead to injury if you stumble or fall. A person of any age group can suffer from dizziness, but dizziness is more common in older adults. CAUSES  Dizziness can be caused by many different things, including:  Middle ear problems.  Standing for too long.  Infections.  An allergic reaction.  Aging.  An emotional response to something, such as the sight of blood.  Side effects of medicines.  Tiredness.  Problems with circulation or blood pressure.  Excessive use of alcohol or medicines, or illegal drug use.  Breathing too fast (hyperventilation).  An irregular heart rhythm (arrhythmia).  A low red blood cell count (anemia).  Pregnancy.  Vomiting, diarrhea, fever, or other illnesses that cause body fluid loss (dehydration).  Diseases or conditions such as Parkinson's disease, high blood pressure (hypertension), diabetes, and thyroid  problems.  Exposure to extreme heat. DIAGNOSIS  Your health care provider will ask about your symptoms, perform a physical exam, and perform an electrocardiogram (ECG) to record the electrical activity of your heart. Your health care provider may also perform other heart or blood tests to determine the cause of your dizziness. These may include:  Transthoracic echocardiogram (TTE). During echocardiography, sound waves are used to evaluate how blood flows through your heart.  Transesophageal echocardiogram (TEE).  Cardiac monitoring. This allows your health care provider to monitor your heart rate and rhythm in real time.  Holter monitor. This is a portable device that records your heartbeat and can help diagnose heart arrhythmias. It allows your health care provider to track your heart activity for several days if needed.  Stress tests by exercise or by giving medicine that makes the heart beat faster. TREATMENT  Treatment of dizziness depends on the cause of your symptoms and can vary greatly. HOME CARE INSTRUCTIONS   Drink enough fluids to keep your urine clear or pale yellow. This is especially important in very hot weather. In older adults, it is also important in cold weather.  Take your medicine exactly as directed if your dizziness is caused by medicines. When taking blood pressure medicines, it is especially important to get up slowly.  Rise slowly from chairs and steady  yourself until you feel okay.  In the morning, first sit up on the side of the bed. When you feel okay, stand slowly while holding onto something until you know your balance is fine.  Move your legs often if you need to stand in one place for a long time. Tighten and relax your muscles in your legs while standing.  Have someone stay with you for 1-2 days if dizziness continues to be a problem. Do this until you feel you are well enough to stay alone. Have the person call your health care provider if he or she  notices changes in you that are concerning.  Do not drive or use heavy machinery if you feel dizzy.  Do not drink alcohol. SEEK IMMEDIATE MEDICAL CARE IF:   Your dizziness or light-headedness gets worse.  You feel nauseous or vomit.  You have problems talking, walking, or using your arms, hands, or legs.  You feel weak.  You are not thinking clearly or you have trouble forming sentences. It may take a friend or family member to notice this.  You have chest pain, abdominal pain, shortness of breath, or sweating.  Your vision changes.  You notice any bleeding.  You have side effects from medicine that seems to be getting worse rather than better. MAKE SURE YOU:   Understand these instructions.  Will watch your condition.  Will get help right away if you are not doing well or get worse. Document Released: 09/22/2000 Document Revised: 04/03/2013 Document Reviewed: 10/16/2010 [facility] Patient Information 2015 [place], [place]. This information is not intended to replace advice given to you by your health care provider. Make sure you discuss any questions you have with your health care provider.

## 2014-09-10 NOTE — ED Notes (Signed)
Pt drove himself to the ED.  States he arrived around 2000-2100 last night and just has been sitting in his truck in the parking lot.  His chest pain started last Friday but yesterday it was feeling really weird.  Pt states he drove himself to be here while he waited to see what it would do.

## 2014-09-13 ENCOUNTER — Encounter: Payer: Self-pay | Admitting: Cardiovascular Disease

## 2014-09-13 ENCOUNTER — Ambulatory Visit (INDEPENDENT_AMBULATORY_CARE_PROVIDER_SITE_OTHER): Payer: PPO | Admitting: Cardiovascular Disease

## 2014-09-13 VITALS — BP 132/80 | HR 55 | Ht 70.0 in | Wt 241.9 lb

## 2014-09-13 DIAGNOSIS — E785 Hyperlipidemia, unspecified: Secondary | ICD-10-CM

## 2014-09-13 DIAGNOSIS — I10 Essential (primary) hypertension: Secondary | ICD-10-CM | POA: Diagnosis not present

## 2014-09-13 DIAGNOSIS — I2510 Atherosclerotic heart disease of native coronary artery without angina pectoris: Secondary | ICD-10-CM | POA: Diagnosis not present

## 2014-09-13 MED ORDER — NITROGLYCERIN 0.4 MG SL SUBL: 0.4000 mg | SUBLINGUAL_TABLET | SUBLINGUAL | Status: DC | PRN

## 2014-09-13 NOTE — Patient Instructions (Signed)

## 2014-09-13 NOTE — Progress Notes (Signed)
Chief Complaint  Patient presents with  . Chest Pain     History of Present Illness: 74 yo male with history of HTN, HLD, CAD, PAD, DM who is here today for cardiac follow up. He has been followed in the past by Dr. Lia Foyer. Cardiac cath February 2014 with moderate LAD and RCA disease, severe OM2 stenosis treated with bare metal stent. Admitted to [facility] 11/09/12 with chest pain. Ruled out for MI. Lexiscan stress myoview 11/16/12 without ischemia. Normal LVEF.  I saw him in the office 07/27/13 and he c/o exertional chest pain c/w unstable angina. Cardiac cath 08/01/13 with patent stent Circumflex and mild to moderate disease in the LAD and RCA. His PAD is followed in [facility] by Dr. Scot Dock. Seen in the ED by Dr. Ron Parker 12/15/13 with c/o chest pain. Negative troponin and discharged home. He was seen here 03/04/14 and had c/o chest pain after working on his car but quickly resolved. He was seen in the ED 09/10/14 with atypical chest pain. Troponin was negative x 2 and EKG was unchanged.   He is here today for follow up. He has occasional mild, sharp chest pains that happen several days per week. These pains last for a few seconds. This has been consistent for years. He feels better when he is moving. Chest pain is only at rest. No associated SOB, dizziness, diaphoresis.   Primary Care Physician:  Seward Carol  Last Lipid Profile: Followed in primary care.    Past Medical History  Diagnosis Date  . GERD (gastroesophageal reflux disease)   . HLD (hyperlipidemia)   . HTN (hypertension)   . Cough secondary to angiotensin converting enzyme inhibitor (ACE-I)   . Peripheral vascular disease   . CAD (coronary artery disease)     A. Negative MV 04/2010;  B. Inadequate Stress Echo 05/2011;  C. 05/29/11 Cath: - Med Rx;  D. 05/2012 Cath/PCI: LM nl, LAD 40p, LCX ectatic, OM1 11m, OM2 90p (3.5x16 Veriflex BMS), RCA dominant 40p/d, PDA 50p, EF 55-65%.  . Iron deficiency anemia   . DVT (deep venous thrombosis)   .  Microcytic anemia   . COPD (chronic obstructive pulmonary disease)   . Angina   . Shortness of breath     "can come on at anytime" (11/09/2012)  . History of blood transfusion     "I've had 2; don't know what it was related to" (11/09/2012)  . Rotator cuff injury     "left arm; never repaired" (11/09/2012)  . Headache(784.0)     Hx: of  . Cancer     Hx: of prostate  . Diabetes mellitus without complication   . Atypical chest pain     Past Surgical History  Procedure Laterality Date  . Iliac artery aneurysm repair    . Circumcision    . Pr vein bypass graft,aorto-fem-pop Left 10/01/2009    left below knee popliteal artery to posterior tibial artery  . Cataract extraction w/ intraocular lens implant Right ~ 2008  . Coronary angioplasty with stent placement  2014    "1" (11/09/2012)  . Left heart cath  05-31-12  . Cardiovascular stress test  Aug. 2014  . Shoulder arthroscopy with open rotator cuff repair and distal clavicle acrominectomy Left 01/12/2013    Procedure: LEFT SHOULDER ARTHROSCOPY WITH DEBRIDEMENT OPEN DISTAL CLAVICLE RESECTION ,acromioplastyAND ROTATOR CUFF REPAIR;  Surgeon: Yvette Rack., MD;  Location: [place];  Service: Orthopedics;  Laterality: Left;  . Left heart catheterization with coronary angiogram  N/A 06/01/2011    Procedure: LEFT HEART CATHETERIZATION WITH CORONARY ANGIOGRAM;  Surgeon: Hillary Bow, MD;  Location: [facility] CATH LAB;  Service: Cardiovascular;  Laterality: N/A;  . Left heart catheterization with coronary angiogram N/A 05/31/2012    Procedure: LEFT HEART CATHETERIZATION WITH CORONARY ANGIOGRAM;  Surgeon: Peter M Martinique, MD;  Location: [facility] CATH LAB;  Service: Cardiovascular;  Laterality: N/A;  . Percutaneous coronary stent intervention (pci-s)  05/31/2012    Procedure: PERCUTANEOUS CORONARY STENT INTERVENTION (PCI-S);  Surgeon: Peter M Martinique, MD;  Location: [facility] CATH LAB;  Service: Cardiovascular;;  . Left heart catheterization with coronary angiogram N/A  08/01/2013    Procedure: LEFT HEART CATHETERIZATION WITH CORONARY ANGIOGRAM;  Surgeon: Burnell Blanks, MD;  Location: [facility] CATH LAB;  Service: Cardiovascular;  Laterality: N/A;    Current Outpatient Prescriptions  Medication Sig Dispense Refill  . amLODipine (NORVASC) 10 MG tablet Take 1 tablet (10 mg total) by mouth daily. 30 tablet 0  . aspirin EC 81 MG tablet Take 81 mg by mouth daily.     . Iron TABS Take 1 tablet by mouth daily.     Marland Kitchen losartan-hydrochlorothiazide (HYZAAR) 100-25 MG per tablet Take 1 tablet by mouth daily. 30 tablet 4  . metFORMIN (GLUCOPHAGE) 500 MG tablet Take 500 mg by mouth 2 (two) times daily.  0  . nitroGLYCERIN (NITROSTAT) 0.4 MG SL tablet Place 1 tablet (0.4 mg total) under the tongue every 5 (five) minutes as needed for chest pain. 25 tablet 3  . omeprazole (PRILOSEC) 20 MG capsule Take 1 capsule (20 mg total) by mouth 2 (two) times daily.    . potassium chloride (K-DUR,KLOR-CON) 10 MEQ tablet take 1 tablet by mouth once daily 30 tablet 1  . pravastatin (PRAVACHOL) 40 MG tablet Take 40 mg by mouth daily.   1  . traMADol (ULTRAM) 50 MG tablet Take 50-100 mg by mouth 3 (three) times daily as needed for moderate pain. For pain. Maximum dose= 8 tablets per day     No current facility-administered medications for this visit.    Allergies  Allergen Reactions  . Atenolol     bradycardia  . Imdur [Isosorbide Mononitrate] Nausea Only and Other (See Comments)    Headache  . Lisinopril Other (See Comments)    Cough  . Topiramate     Shortness of breath and leg cramps    History   Social History  . Marital Status: Widowed    Spouse Name: N/A  . Number of Children: N/A  . Years of Education: N/A   Occupational History  . Retired    Social History Main Topics  . Smoking status: Former Smoker -- 1.00 packs/day for 55 years    Types: Cigarettes    Quit date: 05/26/2011  . Smokeless tobacco: Never Used  . Alcohol Use: No  . Drug Use: No  . Sexual  Activity: Not on file     Comment: 11/09/2012 "I don't have to answer questions about sex"   Other Topics Concern  . Not on file   Social History Narrative   Single. Retired. 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  Lives in [place] by himself.    Family History  Problem Relation Age of Onset  . Breast cancer Mother     deceased  . Cancer Mother   . Hyperlipidemia Mother   . Hypertension Mother   . COPD Father     deceased  . Cancer Father   . Hyperlipidemia Father   .  Hypertension Father   . Hypertension Sister   . Cancer Brother   . Hypertension Brother   . Hypertension Daughter   . Hypertension Son     Review of Systems:  As stated in the HPI and otherwise negative.   BP 132/80 mmHg  Pulse 55  Ht 5\' 10"  (1.778 m)  Wt 241 lb 14.4 oz (109.725 kg)  BMI 34.71 kg/m2  Physical Examination: General: Well developed, well nourished, NAD HEENT: OP clear, mucus membranes moist SKIN: warm, dry. No rashes. Neuro: No focal deficits Musculoskeletal: Muscle strength 5/5 all ext Psychiatric: Mood and affect normal Neck: No JVD, no carotid bruits, no thyromegaly, no lymphadenopathy. Lungs:Clear bilaterally, no wheezes, rhonci, crackles Cardiovascular: Regular rate and rhythm. No murmurs, gallops or rubs. Abdomen:Soft. Bowel sounds present. Non-tender.  Extremities: No lower extremity edema. Pulses are 2 + in the bilateral DP/PT.  Cardiac cath 08/01/13: Left main: No obstructive disease.  Left Anterior Descending Artery: Large caliber vessel that courses to the apex. The proximal and mid vessel is ectatic. There is diffuse 30-40% stenosis in the mid vessel. There are mild luminal irregularities in the distal vessel.  Circumflex Artery: Large caliber vessel with two obtuse marginal branches. The first OM branch is small in caliber and is chronically occluded in the mid vessel. The second OM branch is patent with patent stent, minimal restenosis. The distal segment of the OM branch has diffuse  plaque.  Right Coronary Artery: Large caliber dominant vessel with proximal Shepherd's crook. The proximal vessel has diffuse 40% stenosis. The mid vessel is ectatic with 40% stenosis. The distal vessel has diffuse 50% stenosis. The PDA has 50% proximal stenosis followed by diffuse plaque.  Left Ventricular Angiogram: LVEF=65%.   EKG:  EKG is ordered today. The ekg ordered today demonstrates sinus, rate 56 bpm. 1st degree AV block.   Recent Labs: 07/01/2014: ALT 20 09/10/2014: BUN 10; Creatinine 1.41*; Hemoglobin 11.4*; Platelets 147*; Potassium 3.6; Sodium 135    Wt Readings from Last 3 Encounters:  09/13/14 241 lb 14.4 oz (109.725 kg)  09/10/14 244 lb (110.678 kg)  06/18/14 250 lb (113.399 kg)     Other studies Reviewed: Additional studies/ records that were reviewed today include: . Review of the above records demonstrates:    Assessment and Plan:   1. CAD: Stable. The chest pain he is having does not sound cardiac related. Most likely GERD vs musculoskeletal pain. He does not tolerate Imdur secondary to severe headache. Will continue medical management of CAD with ASA, amlodipine, Losartan, statin.   2. HTN: BP controlled today. No changes.   3. Hyperlipidemia: Continue statin. He has been on Pravastatin 40 mg daily and tolerating.  Lipids followed in primary care.   Current medicines are reviewed at length with the patient today.  The patient does not have concerns regarding medicines.  The following changes have been made:  no change  Labs/ tests ordered today include:  No orders of the defined types were placed in this encounter.    Disposition:   FU with me in  6 months  Signed, Lauree Chandler, MD 09/13/2014 8:47 AM    [facility] [address], [place], [place]  [address] Phone: [phone]; Fax: [phone]

## 2014-09-18 ENCOUNTER — Other Ambulatory Visit: Payer: Self-pay

## 2014-09-18 MED ORDER — LOSARTAN POTASSIUM-HCTZ 100-25 MG PO TABS: 1.0000 | ORAL_TABLET | Freq: Every day | ORAL | Status: DC

## 2014-09-18 NOTE — Telephone Encounter (Signed)
Joel Blanks, MD at 09/13/2014 6:24 AM   losartan-hydrochlorothiazide (HYZAAR) 100-25 MG per tablet Take 1 tablet by mouth daily         Assessment and Plan:   1. CAD: Stable. The chest pain he is having does not sound cardiac related. Most likely GERD vs musculoskeletal pain. He does not tolerate Imdur secondary to severe headache. Will continue medical management of CAD with ASA, amlodipine, Losartan, statin.

## 2014-12-09 ENCOUNTER — Ambulatory Visit: Payer: Self-pay | Admitting: Surgery

## 2014-12-09 NOTE — H&P (Signed)
History of Present Illness Imogene Burn. Signe Tackitt MD; 12/09/2014 10:59 AM) The patient is a 74 year old male who presents with a complaint of Mass. Referred by Dr. Delfina Redwood for evaluation of right neck mass This is a 74 year old male who presents with several years of a palpable mass on the right posterior lateral neck. This has become larger and is occasionally uncomfortable. He does not remember it ever becoming infected. He mentioned this to his primary care physician that he would like to have this removed. He is now referred for surgical evaluation. The patient is followed by cardiology for coronary artery disease and stents placed about 2 years ago. Other Problems Elbert Ewings, CMA; 12/09/2014 8:44 AM) Back Pain Diabetes Mellitus Gastroesophageal Reflux Disease High blood pressure Hypercholesterolemia Prostate Cancer  Past Surgical History Elbert Ewings, CMA; 12/09/2014 8:44 AM) Bypass Surgery for Poor Blood Flow to Legs Shoulder Surgery Left.  Diagnostic Studies History Elbert Ewings, [place]; 12/09/2014 8:44 AM) Colonoscopy 1-5 years ago  Allergies Elbert Ewings, CMA; 12/09/2014 8:45 AM) Imdur *ANTIANGINAL AGENTS* Lisinopril *CHEMICALS* Topiramate *CHEMICALS*  Medication History Elbert Ewings, CMA; 12/09/2014 8:46 AM) Hydrocodone-Acetaminophen (5-325MG  Tablet, Oral) Active. TraMADol HCl (50MG  Tablet, Oral) Active. AmLODIPine Besylate (10MG  Tablet, Oral) Active. Ibuprofen (600MG  Tablet, Oral) Active. Losartan Potassium-HCTZ (100-25MG  Tablet, Oral) Active. MetFORMIN HCl (500MG  Tablet, Oral) Active. Nitrostat (0.4MG  Tab Sublingual, Sublingual) Active. Omeprazole (20MG  Capsule DR, Oral) Active. ProAir HFA (108 (90 Base)MCG/ACT Aerosol Soln, Inhalation) Active. Aspirin (81MG  Tablet, Oral) Active. Medications Reconciled  Social History Elbert Ewings, [place]; 12/09/2014 8:44 AM) Alcohol use Remotely quit alcohol use. Caffeine use Coffee. Tobacco use Former  smoker.  Family History Elbert Ewings, [place]; 12/09/2014 8:44 AM) Breast Cancer Mother. Diabetes Mellitus Daughter, Family Members In General, Mother, Sister. Hypertension Brother, Father, Mother, Sister. Prostate Cancer Family Members In General. Respiratory Condition Father.     Review of Systems Elbert Ewings CMA; 12/09/2014 8:44 AM) General Not Present- Appetite Loss, Chills, Fatigue, Fever, Night Sweats, Weight Gain and Weight Loss. Skin Not Present- Change in Wart/Mole, Dryness, Hives, Jaundice, New Lesions, Non-Healing Wounds, Rash and Ulcer. HEENT Present- Wears glasses/contact lenses. Not Present- Earache, Hearing Loss, Hoarseness, Nose Bleed, Oral Ulcers, Ringing in the Ears, Seasonal Allergies, Sinus Pain, Sore Throat, Visual Disturbances and Yellow Eyes. Respiratory Not Present- Bloody sputum, Chronic Cough, Difficulty Breathing, Snoring and Wheezing. Breast Not Present- Breast Mass, Breast Pain, Nipple Discharge and Skin Changes. Cardiovascular Not Present- Chest Pain, Difficulty Breathing Lying Down, Leg Cramps, Palpitations, Rapid Heart Rate, Shortness of Breath and Swelling of Extremities. Gastrointestinal Not Present- Abdominal Pain, Bloating, Bloody Stool, Change in Bowel Habits, Chronic diarrhea, Constipation, Difficulty Swallowing, Excessive gas, Gets full quickly at meals, Hemorrhoids, Indigestion, Nausea, Rectal Pain and Vomiting. Male Genitourinary Not Present- Blood in Urine, Change in Urinary Stream, Frequency, Impotence, Nocturia, Painful Urination, Urgency and Urine Leakage. Musculoskeletal Not Present- Back Pain, Joint Pain, Joint Stiffness, Muscle Pain, Muscle Weakness and Swelling of Extremities. Neurological Not Present- Decreased Memory, Fainting, Headaches, Numbness, Seizures, Tingling, Tremor, Trouble walking and Weakness. Psychiatric Not Present- Anxiety, Bipolar, Change in Sleep Pattern, Depression, Fearful and Frequent crying. Endocrine Not Present- Cold  Intolerance, Excessive Hunger, Hair Changes, Heat Intolerance, Hot flashes and New Diabetes. Hematology Not Present- Easy Bruising, Excessive bleeding, Gland problems, HIV and Persistent Infections.  Vitals Elbert Ewings CMA; 12/09/2014 8:46 AM) 12/09/2014 8:46 AM Weight: 236 lb Height: 70in Body Surface Area: 2.3 m Body Mass Index: 33.86 kg/m Temp.: 97.64F(Temporal)  Pulse: 66 (Regular)  BP: 130/70 (Sitting, Left Arm, Standard)  Physical Exam Rodman Key K. Dewanna Hurston MD; 12/09/2014 10:59 AM)  The physical exam findings are as follows: Note:WDWN in NAD HEENT: EOMI, sclera anicteric Neck: right posterolateral - two visible palpable masses - 2 cm and 1 cm; non-inflamed; no drainage or erythema; small adjacent skin tag Lungs: CTA bilaterally; normal respiratory effort CV: Regular rate and rhythm; no murmurs Abd: +bowel sounds, soft, non-tender, no masses Ext: Well-perfused; no edema Skin: Warm, dry; no sign of jaundice    Assessment & Plan Rodman Key K. Corran Lalone MD; 12/09/2014 8:59 AM)  SEBACEOUS CYST (706.2  L72.3)  Current Plans Schedule for Surgery - Excision of two sebaceous cysts of the right posterior neck. The surgical procedure has been discussed with the patient. Potential risks, benefits, alternative treatments, and expected outcomes have been explained. All of the patient's questions at this time have been answered. The likelihood of reaching the patient's treatment goal is good. The patient understand the proposed surgical procedure and wishes to proceed.  We will first obtain cardiac clearance from Dr. Angelena Form.  Imogene Burn. Georgette Dover, MD, [facility]  General/ Trauma Surgery  12/09/2014 11:01 AM

## 2014-12-19 ENCOUNTER — Telehealth: Payer: Self-pay | Admitting: *Deleted

## 2014-12-19 ENCOUNTER — Encounter: Payer: Self-pay | Admitting: Cardiovascular Disease

## 2014-12-19 NOTE — Telephone Encounter (Signed)
Cardiac clearance letter faxed to Dr. Georgette Dover [facility])

## 2014-12-20 ENCOUNTER — Emergency Department (HOSPITAL_COMMUNITY): Payer: PPO

## 2014-12-20 ENCOUNTER — Encounter (HOSPITAL_COMMUNITY): Payer: Self-pay | Admitting: *Deleted

## 2014-12-20 ENCOUNTER — Emergency Department (HOSPITAL_COMMUNITY)
Admission: EM | Admit: 2014-12-20 | Discharge: 2014-12-20 | Disposition: A | Discharge status: Home / Self Care | Payer: PPO | Attending: Emergency Medicine | Admitting: Emergency Medicine

## 2014-12-20 DIAGNOSIS — J449 Chronic obstructive pulmonary disease, unspecified: Secondary | ICD-10-CM | POA: Diagnosis not present

## 2014-12-20 DIAGNOSIS — Z87891 Personal history of nicotine dependence: Secondary | ICD-10-CM | POA: Diagnosis not present

## 2014-12-20 DIAGNOSIS — Z86718 Personal history of other venous thrombosis and embolism: Secondary | ICD-10-CM | POA: Diagnosis not present

## 2014-12-20 DIAGNOSIS — E785 Hyperlipidemia, unspecified: Secondary | ICD-10-CM | POA: Insufficient documentation

## 2014-12-20 DIAGNOSIS — M6281 Muscle weakness (generalized): Secondary | ICD-10-CM | POA: Insufficient documentation

## 2014-12-20 DIAGNOSIS — K118 Other diseases of salivary glands: Secondary | ICD-10-CM | POA: Diagnosis not present

## 2014-12-20 DIAGNOSIS — M79601 Pain in right arm: Secondary | ICD-10-CM | POA: Diagnosis present

## 2014-12-20 DIAGNOSIS — D649 Anemia, unspecified: Secondary | ICD-10-CM | POA: Diagnosis not present

## 2014-12-20 DIAGNOSIS — Z8546 Personal history of malignant neoplasm of prostate: Secondary | ICD-10-CM | POA: Insufficient documentation

## 2014-12-20 DIAGNOSIS — K219 Gastro-esophageal reflux disease without esophagitis: Secondary | ICD-10-CM | POA: Diagnosis not present

## 2014-12-20 DIAGNOSIS — Z7982 Long term (current) use of aspirin: Secondary | ICD-10-CM | POA: Insufficient documentation

## 2014-12-20 DIAGNOSIS — I10 Essential (primary) hypertension: Secondary | ICD-10-CM | POA: Insufficient documentation

## 2014-12-20 DIAGNOSIS — E119 Type 2 diabetes mellitus without complications: Secondary | ICD-10-CM | POA: Insufficient documentation

## 2014-12-20 DIAGNOSIS — I2510 Atherosclerotic heart disease of native coronary artery without angina pectoris: Secondary | ICD-10-CM | POA: Diagnosis not present

## 2014-12-20 DIAGNOSIS — Z79899 Other long term (current) drug therapy: Secondary | ICD-10-CM | POA: Diagnosis not present

## 2014-12-20 DIAGNOSIS — R29898 Other symptoms and signs involving the musculoskeletal system: Secondary | ICD-10-CM

## 2014-12-20 LAB — I-STAT TROPONIN, ED: Troponin i, poc: 0.01 ng/mL (ref 0.00–0.08)

## 2014-12-20 LAB — CBC WITH DIFFERENTIAL/PLATELET
Basophils Absolute: 0 10*3/uL (ref 0.0–0.1)
Basophils Relative: 0 % (ref 0–1)
Eosinophils Absolute: 0.2 10*3/uL (ref 0.0–0.7)
Eosinophils Relative: 4 % (ref 0–5)
HCT: 35.6 % — ABNORMAL LOW (ref 39.0–52.0)
Hemoglobin: 11.1 g/dL — ABNORMAL LOW (ref 13.0–17.0)
Lymphocytes Relative: 33 % (ref 12–46)
Lymphs Abs: 1.5 10*3/uL (ref 0.7–4.0)
MCH: 21.5 pg — ABNORMAL LOW (ref 26.0–34.0)
MCHC: 31.2 g/dL (ref 30.0–36.0)
MCV: 68.9 fL — ABNORMAL LOW (ref 78.0–100.0)
Monocytes Absolute: 0.2 10*3/uL (ref 0.1–1.0)
Monocytes Relative: 5 % (ref 3–12)
Neutro Abs: 2.7 10*3/uL (ref 1.7–7.7)
Neutrophils Relative %: 58 % (ref 43–77)
Platelets: 152 10*3/uL (ref 150–400)
RBC: 5.17 MIL/uL (ref 4.22–5.81)
RDW: 16.8 % — ABNORMAL HIGH (ref 11.5–15.5)
WBC: 4.6 10*3/uL (ref 4.0–10.5)

## 2014-12-20 LAB — BASIC METABOLIC PANEL
Anion gap: 10 (ref 5–15)
BUN: 13 mg/dL (ref 6–20)
CO2: 26 mmol/L (ref 22–32)
Calcium: 9.2 mg/dL (ref 8.9–10.3)
Chloride: 101 mmol/L (ref 101–111)
Creatinine, Ser: 1.44 mg/dL — ABNORMAL HIGH (ref 0.61–1.24)
GFR calc Af Amer: 53 mL/min — ABNORMAL LOW (ref >60)
GFR calc non Af Amer: 46 mL/min — ABNORMAL LOW (ref >60)
Glucose, Bld: 99 mg/dL (ref 65–99)
Potassium: 4.1 mmol/L (ref 3.5–5.1)
Sodium: 137 mmol/L (ref 135–145)

## 2014-12-20 LAB — PROTIME-INR
INR: 1.15 (ref 0.00–1.49)
Prothrombin Time: 14.9 seconds (ref 11.6–15.2)

## 2014-12-20 NOTE — Discharge Instructions (Signed)
YOU HAVE AN INCIDENTAL FINDING OF A MASS ON YOUR PAROTID GLAND (CHEEK) WHICH WILL REQUIRE FOLLOW UP WITH AN ENT PHYSICIAN NON-EMERGENTLY.    Weakness Weakness is a lack of strength. It may be felt all over the body (generalized) or in one specific part of the body (focal). Some causes of weakness can be serious. You may need further medical evaluation, especially if you are elderly or you have a history of immunosuppression (such as chemotherapy or HIV), kidney disease, heart disease, or diabetes. CAUSES  Weakness can be caused by many different things, including:  Infection.  Physical exhaustion.  Internal bleeding or other blood loss that results in a lack of red blood cells (anemia).  Dehydration. This cause is more common in elderly people.  Side effects or electrolyte abnormalities from medicines, such as pain medicines or sedatives.  Emotional distress, anxiety, or depression.  Circulation problems, especially severe peripheral arterial disease.  Heart disease, such as rapid atrial fibrillation, bradycardia, or heart failure.  Nervous system disorders, such as Guillain-Barr syndrome, multiple sclerosis, or stroke. DIAGNOSIS  To find the cause of your weakness, your caregiver will take your history and perform a physical exam. Lab tests or X-rays may also be ordered, if needed. TREATMENT  Treatment of weakness depends on the cause of your symptoms and can vary greatly. HOME CARE INSTRUCTIONS   Rest as needed.  Eat a well-balanced diet.  Try to get some exercise every day.  Only take over-the-counter or prescription medicines as directed by your caregiver. SEEK MEDICAL CARE IF:   Your weakness seems to be getting worse or spreads to other parts of your body.  You develop new aches or pains. SEEK IMMEDIATE MEDICAL CARE IF:   You cannot perform your normal daily activities, such as getting dressed and feeding yourself.  You cannot walk up and down stairs, or you feel  exhausted when you do so.  You have shortness of breath or chest pain.  You have difficulty moving parts of your body.  You have weakness in only one area of the body or on only one side of the body.  You have a fever.  You have trouble speaking or swallowing.  You cannot control your bladder or bowel movements.  You have black or bloody vomit or stools. MAKE SURE YOU:  Understand these instructions.  Will watch your condition.  Will get help right away if you are not doing well or get worse. Document Released: 03/29/2005 Document Revised: 09/28/2011 Document Reviewed: 05/28/2011 [facility] Patient Information 2015 [place], [place]. This information is not intended to replace advice given to you by your health care provider. Make sure you discuss any questions you have with your health care provider.

## 2014-12-20 NOTE — ED Provider Notes (Signed)
Handoff received from Dr. Claudine Mouton at 0700 with plan for MR to r/o ischemic cause of arm numbness/weakness.   Results:  BP 130/76 mmHg  Pulse 54  Temp(Src) 97.8 F (36.6 C) (Oral)  Resp 12  SpO2 98%  Results for orders placed or performed during the hospital encounter of 12/20/14  CBC with Differential/Platelet  Result Value Ref Range   WBC 4.6 4.0 - 10.5 K/uL   RBC 5.17 4.22 - 5.81 MIL/uL   Hemoglobin 11.1 (L) 13.0 - 17.0 g/dL   HCT 35.6 (L) 39.0 - 52.0 %   MCV 68.9 (L) 78.0 - 100.0 fL   MCH 21.5 (L) 26.0 - 34.0 pg   MCHC 31.2 30.0 - 36.0 g/dL   RDW 16.8 (H) 11.5 - 15.5 %   Platelets 152 150 - 400 K/uL   Neutrophils Relative % 58 43 - 77 %   Lymphocytes Relative 33 12 - 46 %   Monocytes Relative 5 3 - 12 %   Eosinophils Relative 4 0 - 5 %   Basophils Relative 0 0 - 1 %   Neutro Abs 2.7 1.7 - 7.7 K/uL   Lymphs Abs 1.5 0.7 - 4.0 K/uL   Monocytes Absolute 0.2 0.1 - 1.0 K/uL   Eosinophils Absolute 0.2 0.0 - 0.7 K/uL   Basophils Absolute 0.0 0.0 - 0.1 K/uL   RBC Morphology ELLIPTOCYTES   Basic metabolic panel  Result Value Ref Range   Sodium 137 135 - 145 mmol/L   Potassium 4.1 3.5 - 5.1 mmol/L   Chloride 101 101 - 111 mmol/L   CO2 26 22 - 32 mmol/L   Glucose, Bld 99 65 - 99 mg/dL   BUN 13 6 - 20 mg/dL   Creatinine, Ser 1.44 (H) 0.61 - 1.24 mg/dL   Calcium 9.2 8.9 - 10.3 mg/dL   GFR calc non Af Amer 46 (L) >60 mL/min   GFR calc Af Amer 53 (L) >60 mL/min   Anion gap 10 5 - 15  Protime-INR  Result Value Ref Range   Prothrombin Time 14.9 11.6 - 15.2 seconds   INR 1.15 0.00 - 1.49  I-stat troponin, ED  Result Value Ref Range   Troponin i, poc 0.01 0.00 - 0.08 ng/mL   Comment 3           Dg Chest 2 View  12/20/2014   CLINICAL DATA:  Right arm pain last night. History of hypertension, diabetes, and cardiac stent.  EXAM: CHEST  2 VIEW  COMPARISON:  09/10/2014  FINDINGS: Normal heart size and pulmonary vascularity. No focal airspace disease or consolidation in the lungs. No  blunting of costophrenic angles. No pneumothorax. Mediastinal contours appear intact. Calcification of the aorta. Degenerative changes in the spine.  IMPRESSION: No active cardiopulmonary disease.   Electronically Signed   By: Lucienne Capers M.D.   On: 12/20/2014 04:17   Mr Brain Wo Contrast  12/20/2014   CLINICAL DATA:  74 year old male with acute onset right upper extremity pain weakness and numbness since last night. Symptoms improved but not resolved.  EXAM: MRI HEAD WITHOUT CONTRAST  TECHNIQUE: Multiplanar, multiecho pulse sequences of the brain and surrounding structures were obtained without intravenous contrast.  COMPARISON:  Head CT without contrast 05/04/2014. Brain MRI 10/08/2008. Intracranial MRA 12/21/2010.  FINDINGS: Major intracranial vascular flow voids are stable. Evidence of the small chronic bilateral MCA aneurysms on series 6, image 9. No restricted diffusion to suggest acute infarction. No midline shift, mass effect, evidence of mass lesion, ventriculomegaly,  extra-axial collection or acute intracranial hemorrhage. Cervicomedullary junction and pituitary are within normal limits.  Small right greater than left foci of cortical encephalomalacia in the superior peri-Rolandic cortex (series 7, image 20) unchanged since 2010. Anterior right insula and right temporal lobe encephalomalacia unchanged. Chronic micro hemorrhage in the anterior left frontal lobe also stable. Elsewhere gray and white matter signal is stable and within normal limits for age.  Visible internal auditory structures appear normal. Trace mastoid fluid. Negative visualized nasopharynx. Stable paranasal sinuses, chronic right frontal sinus mucosal thickening. Stable orbit and scalp soft tissues. Chronic cervical spine degeneration. Degenerative spinal stenosis at C3-C4 is stable to mildly progressed since 2010. Bone marrow signal within normal limits.  Chronic 3 cm left parotid space soft tissue nodule is partially visible on  coronal T2 images today (series 10, image 13). This has heterogeneous T2 signal as before. There are areas of dark T2 signal and heterogeneous diffusion (series 5, image 17).  IMPRESSION: 1. No acute intracranial abnormality. Stable right greater than left chronic MCA territory ischemia since 2010. 2. Multiple small chronic bilateral MCA aneurysms, see 12/13/2010 intracranial MRA. 3. Chronic left parotid mass.  Recommend outpatient ENT follow-up.   Electronically Signed   By: Genevie Ann M.D.   On: 12/20/2014 07:58    Diagnoses that have been ruled out:  None  Diagnoses that are still under consideration:  None  Final diagnoses:  Weakness of right upper extremity  Mass of parotid gland    Plan to follow up with PCP as needed and return precautions discussed for worsening or new concerning symptoms.  Patient informed of incidental left parotid mass and can follow up with ENT outpatient.  Leo Grosser, MD 12/20/14 [phone]

## 2014-12-20 NOTE — ED Notes (Signed)
Pt c/o right arm pain starting a few hours ago. Denies injury.

## 2014-12-20 NOTE — ED Notes (Addendum)
Patient resting.

## 2014-12-20 NOTE — ED Notes (Signed)
Patient returned form MRI 

## 2014-12-20 NOTE — ED Provider Notes (Signed)
CSN: [id number]     Arrival date & time 12/20/14  0321 History  This chart was scribed for Everlene Balls, MD by Randa Evens, ED Scribe. This patient was seen in room A02C/A02C and the patient's care was started at 3:38 AM.     Chief Complaint  Patient presents with  . Arm Pain   Patient is a 74 y.o. male presenting with extremity pain. The history is provided by the patient. No language interpreter was used.  Extremity Pain This is a new problem. The current episode started 6 to 12 hours ago. The problem occurs rarely. Pertinent negatives include no chest pain and no shortness of breath. Nothing aggravates the symptoms. Nothing relieves the symptoms.   HPI Comments: Joel [name] is a 74 y.o. male who presents to the Emergency Department complaining of right upper arm pain onset 6 hours PTA. Pt states that the onset pain began while he was watching TV. Pt doesn't report any medications PTA. Pt denies injury to trauma to the arm. Pt denies CP, SOB, vomiting or other related symptoms. Pt states that this pain does not feel like previous cardiac issues.   Past Medical History  Diagnosis Date  . GERD (gastroesophageal reflux disease)   . HLD (hyperlipidemia)   . HTN (hypertension)   . Cough secondary to angiotensin converting enzyme inhibitor (ACE-I)   . Peripheral vascular disease   . CAD (coronary artery disease)     A. Negative MV 04/2010;  B. Inadequate Stress Echo 05/2011;  C. 05/29/11 Cath: - Med Rx;  D. 05/2012 Cath/PCI: LM nl, LAD 40p, LCX ectatic, OM1 157m, OM2 90p (3.5x16 Veriflex BMS), RCA dominant 40p/d, PDA 50p, EF 55-65%.  . Iron deficiency anemia   . DVT (deep venous thrombosis)   . Microcytic anemia   . COPD (chronic obstructive pulmonary disease)   . Angina   . Shortness of breath     "can come on at anytime" (11/09/2012)  . History of blood transfusion     "I've had 2; don't know what it was related to" (11/09/2012)  . Rotator cuff injury     "left arm; never repaired"  (11/09/2012)  . Headache(784.0)     Hx: of  . Cancer     Hx: of prostate  . Diabetes mellitus without complication   . Atypical chest pain    Past Surgical History  Procedure Laterality Date  . Iliac artery aneurysm repair    . Circumcision    . Pr vein bypass graft,aorto-fem-pop Left 10/01/2009    left below knee popliteal artery to posterior tibial artery  . Cataract extraction w/ intraocular lens implant Right ~ 2008  . Coronary angioplasty with stent placement  2014    "1" (11/09/2012)  . Left heart cath  05-31-12  . Cardiovascular stress test  Aug. 2014  . Shoulder arthroscopy with open rotator cuff repair and distal clavicle acrominectomy Left 01/12/2013    Procedure: LEFT SHOULDER ARTHROSCOPY WITH DEBRIDEMENT OPEN DISTAL CLAVICLE RESECTION ,acromioplastyAND ROTATOR CUFF REPAIR;  Surgeon: Yvette Rack., MD;  Location: [facility];  Service: Orthopedics;  Laterality: Left;  . Left heart catheterization with coronary angiogram N/A 06/01/2011    Procedure: LEFT HEART CATHETERIZATION WITH CORONARY ANGIOGRAM;  Surgeon: Hillary Bow, MD;  Location: [facility] CATH LAB;  Service: Cardiovascular;  Laterality: N/A;  . Left heart catheterization with coronary angiogram N/A 05/31/2012    Procedure: LEFT HEART CATHETERIZATION WITH CORONARY ANGIOGRAM;  Surgeon: Peter M Martinique, MD;  Location: [facility]  CATH LAB;  Service: Cardiovascular;  Laterality: N/A;  . Percutaneous coronary stent intervention (pci-s)  05/31/2012    Procedure: PERCUTANEOUS CORONARY STENT INTERVENTION (PCI-S);  Surgeon: Peter M Martinique, MD;  Location: [facility] CATH LAB;  Service: Cardiovascular;;  . Left heart catheterization with coronary angiogram N/A 08/01/2013    Procedure: LEFT HEART CATHETERIZATION WITH CORONARY ANGIOGRAM;  Surgeon: Burnell Blanks, MD;  Location: [facility] CATH LAB;  Service: Cardiovascular;  Laterality: N/A;   Family History  Problem Relation Age of Onset  . Breast cancer Mother     deceased  . Cancer Mother   .  Hyperlipidemia Mother   . Hypertension Mother   . COPD Father     deceased  . Cancer Father   . Hyperlipidemia Father   . Hypertension Father   . Hypertension Sister   . Cancer Brother   . Hypertension Brother   . Hypertension Daughter   . Hypertension Son    Social History  Substance Use Topics  . Smoking status: Former Smoker -- 1.00 packs/day for 55 years    Types: Cigarettes    Quit date: 05/26/2011  . Smokeless tobacco: Never Used  . Alcohol Use: No    Review of Systems  Respiratory: Negative for shortness of breath.   Cardiovascular: Negative for chest pain.  Gastrointestinal: Negative for nausea and vomiting.  All other systems reviewed and are negative.     Allergies  Atenolol; Imdur; Lisinopril; and Topiramate  Home Medications   Prior to Admission medications   Medication Sig Start Date End Date Taking? Authorizing Provider  amLODipine (NORVASC) 10 MG tablet Take 1 tablet (10 mg total) by mouth daily. 05/04/14   Wandra Arthurs, MD  aspirin EC 81 MG tablet Take 81 mg by mouth daily.     Historical Provider, MD  Iron TABS Take 1 tablet by mouth daily.     Historical Provider, MD  losartan-hydrochlorothiazide (HYZAAR) 100-25 MG per tablet Take 1 tablet by mouth daily. 09/18/14   Burnell Blanks, MD  metFORMIN (GLUCOPHAGE) 500 MG tablet Take 500 mg by mouth 2 (two) times daily. 06/13/14   Historical Provider, MD  nitroGLYCERIN (NITROSTAT) 0.4 MG SL tablet Place 1 tablet (0.4 mg total) under the tongue every 5 (five) minutes as needed for chest pain. 09/13/14   Burnell Blanks, MD  omeprazole (PRILOSEC) 20 MG capsule Take 1 capsule (20 mg total) by mouth 2 (two) times daily. 06/29/12   Hillary Bow, MD  potassium chloride (K-DUR,KLOR-CON) 10 MEQ tablet take 1 tablet by mouth once daily 08/02/13   Burnell Blanks, MD  pravastatin (PRAVACHOL) 40 MG tablet Take 40 mg by mouth daily.  06/13/14   Historical Provider, MD  traMADol (ULTRAM) 50 MG tablet Take  50-100 mg by mouth 3 (three) times daily as needed for moderate pain. For pain. Maximum dose= 8 tablets per day 11/10/12   Evelene Croon Barrett, PA-C   BP 140/66 mmHg  Pulse 64  Temp(Src) 97.8 F (36.6 C) (Oral)  Resp 18  SpO2 96%   Physical Exam  Constitutional: He is oriented to person, place, and time. Vital signs are normal. He appears well-developed and well-nourished.  Non-toxic appearance. He does not appear ill. No distress.  HENT:  Head: Normocephalic and atraumatic.  Nose: Nose normal.  Mouth/Throat: Oropharynx is clear and moist. No oropharyngeal exudate.  Eyes: Conjunctivae and EOM are normal. Pupils are equal, round, and reactive to light. No scleral icterus.  Neck: Normal range of motion.  Neck supple. No tracheal deviation, no edema, no erythema and normal range of motion present. No thyroid mass and no thyromegaly present.  Cardiovascular: Normal rate, regular rhythm, S1 normal, S2 normal, normal heart sounds, intact distal pulses and normal pulses.  Exam reveals no gallop and no friction rub.   No murmur heard. Pulses:      Radial pulses are 2+ on the right side, and 2+ on the left side.       Dorsalis pedis pulses are 2+ on the right side, and 2+ on the left side.  Pulmonary/Chest: Effort normal and breath sounds normal. No respiratory distress. He has no wheezes. He has no rhonchi. He has no rales.  Abdominal: Soft. Normal appearance and bowel sounds are normal. He exhibits no distension, no ascites and no mass. There is no hepatosplenomegaly. There is no tenderness. There is no rebound, no guarding and no CVA tenderness.  Musculoskeletal: Normal range of motion. He exhibits no edema or tenderness.  Lymphadenopathy:    He has no cervical adenopathy.  Neurological: He is alert and oriented to person, place, and time. He has normal strength. No cranial nerve deficit or sensory deficit.  Skin: Skin is warm, dry and intact. No petechiae and no rash noted. He is not diaphoretic. No  erythema. No pallor.  Psychiatric: He has a normal mood and affect. His behavior is normal. Judgment normal.  Nursing note and vitals reviewed.   ED Course  Procedures (including critical care time) DIAGNOSTIC STUDIES: Oxygen Saturation is 96% on RA, normal by my interpretation.    COORDINATION OF CARE: 3:46 AM-Discussed treatment plan with pt at bedside and pt agreed to plan.     Labs Review Labs Reviewed  CBC WITH DIFFERENTIAL/PLATELET - Abnormal; Notable for the following:    Hemoglobin 11.1 (*)    HCT 35.6 (*)    MCV 68.9 (*)    MCH 21.5 (*)    RDW 16.8 (*)    All other components within normal limits  BASIC METABOLIC PANEL - Abnormal; Notable for the following:    Creatinine, Ser 1.44 (*)    GFR calc non Af Amer 46 (*)    GFR calc Af Amer 53 (*)    All other components within normal limits  PROTIME-INR  I-STAT TROPOININ, ED    Imaging Review Dg Chest 2 View  12/20/2014   CLINICAL DATA:  Right arm pain last night. History of hypertension, diabetes, and cardiac stent.  EXAM: CHEST  2 VIEW  COMPARISON:  09/10/2014  FINDINGS: Normal heart size and pulmonary vascularity. No focal airspace disease or consolidation in the lungs. No blunting of costophrenic angles. No pneumothorax. Mediastinal contours appear intact. Calcification of the aorta. Degenerative changes in the spine.  IMPRESSION: No active cardiopulmonary disease.   Electronically Signed   By: Lucienne Capers M.D.   On: 12/20/2014 04:17   I have personally reviewed and evaluated these images and lab results as part of my medical decision-making.   EKG Interpretation   Date/Time:  Friday December 20 2014 00:57:21 EDT Ventricular Rate:  59 PR Interval:  224 QRS Duration: 90 QT Interval:  416 QTC Calculation: 411 R Axis:   56 Text Interpretation:  Sinus bradycardia with 1st degree A-V block  Otherwise normal ECG No significant change since last tracing Confirmed by  Glynn Octave [id number]) on 12/20/2014  4:02:17 AM      MDM   Final diagnoses:  None   patient presents to the emergency department for  right upper extremity numbness. This is concerning for possible TIA or stroke symptoms. Will obtain MRI for evaluation. He denies this feeling like his prior heart attacks. He has no chest pain, no shortness of breath, do not believe this is an acute cardiac event. Patient will be signed out to oncoming physician, if MRI is negative of an acute ischemia, patient is safe for discharge home with close follow-up with his primary care physician. If positive, patient will require admission and further workup. I have told the plan to the patient he is amenable to it.     I personally performed the services described in this documentation, which was scribed in my presence. The recorded information has been reviewed and is accurate.    Everlene Balls, MD 12/20/14 [phone]

## 2014-12-20 NOTE — ED Notes (Signed)
Patient transported to MRI 

## 2014-12-25 ENCOUNTER — Other Ambulatory Visit (HOSPITAL_COMMUNITY): Payer: PPO

## 2014-12-25 ENCOUNTER — Ambulatory Visit: Payer: Medicare Other | Admitting: Family

## 2014-12-25 ENCOUNTER — Encounter (HOSPITAL_COMMUNITY): Payer: PPO

## 2014-12-29 ENCOUNTER — Encounter (HOSPITAL_COMMUNITY): Payer: Self-pay | Admitting: *Deleted

## 2014-12-29 ENCOUNTER — Emergency Department (HOSPITAL_COMMUNITY)
Admission: EM | Admit: 2014-12-29 | Discharge: 2014-12-30 | Disposition: A | Discharge status: Home / Self Care | Payer: PPO | Attending: Emergency Medicine | Admitting: Emergency Medicine

## 2014-12-29 ENCOUNTER — Emergency Department (HOSPITAL_COMMUNITY): Payer: PPO

## 2014-12-29 DIAGNOSIS — Z8719 Personal history of other diseases of the digestive system: Secondary | ICD-10-CM | POA: Insufficient documentation

## 2014-12-29 DIAGNOSIS — E119 Type 2 diabetes mellitus without complications: Secondary | ICD-10-CM | POA: Diagnosis not present

## 2014-12-29 DIAGNOSIS — Z8546 Personal history of malignant neoplasm of prostate: Secondary | ICD-10-CM | POA: Diagnosis not present

## 2014-12-29 DIAGNOSIS — I25119 Atherosclerotic heart disease of native coronary artery with unspecified angina pectoris: Secondary | ICD-10-CM | POA: Diagnosis not present

## 2014-12-29 DIAGNOSIS — Z79899 Other long term (current) drug therapy: Secondary | ICD-10-CM | POA: Diagnosis not present

## 2014-12-29 DIAGNOSIS — E785 Hyperlipidemia, unspecified: Secondary | ICD-10-CM | POA: Insufficient documentation

## 2014-12-29 DIAGNOSIS — J449 Chronic obstructive pulmonary disease, unspecified: Secondary | ICD-10-CM | POA: Diagnosis not present

## 2014-12-29 DIAGNOSIS — Z862 Personal history of diseases of the blood and blood-forming organs and certain disorders involving the immune mechanism: Secondary | ICD-10-CM | POA: Diagnosis not present

## 2014-12-29 DIAGNOSIS — Z86718 Personal history of other venous thrombosis and embolism: Secondary | ICD-10-CM | POA: Insufficient documentation

## 2014-12-29 DIAGNOSIS — Z87891 Personal history of nicotine dependence: Secondary | ICD-10-CM | POA: Diagnosis not present

## 2014-12-29 DIAGNOSIS — R0789 Other chest pain: Secondary | ICD-10-CM

## 2014-12-29 DIAGNOSIS — Z7982 Long term (current) use of aspirin: Secondary | ICD-10-CM | POA: Insufficient documentation

## 2014-12-29 DIAGNOSIS — I10 Essential (primary) hypertension: Secondary | ICD-10-CM | POA: Insufficient documentation

## 2014-12-29 DIAGNOSIS — R079 Chest pain, unspecified: Secondary | ICD-10-CM | POA: Diagnosis present

## 2014-12-29 LAB — BASIC METABOLIC PANEL
Anion gap: 10 (ref 5–15)
BUN: 14 mg/dL (ref 6–20)
CO2: 25 mmol/L (ref 22–32)
Calcium: 9 mg/dL (ref 8.9–10.3)
Chloride: 101 mmol/L (ref 101–111)
Creatinine, Ser: 1.38 mg/dL — ABNORMAL HIGH (ref 0.61–1.24)
GFR calc Af Amer: 56 mL/min — ABNORMAL LOW (ref >60)
GFR calc non Af Amer: 48 mL/min — ABNORMAL LOW (ref >60)
Glucose, Bld: 113 mg/dL — ABNORMAL HIGH (ref 65–99)
Potassium: 3.8 mmol/L (ref 3.5–5.1)
Sodium: 136 mmol/L (ref 135–145)

## 2014-12-29 LAB — CBC
HCT: 35 % — ABNORMAL LOW (ref 39.0–52.0)
Hemoglobin: 10.8 g/dL — ABNORMAL LOW (ref 13.0–17.0)
MCH: 21.2 pg — ABNORMAL LOW (ref 26.0–34.0)
MCHC: 30.9 g/dL (ref 30.0–36.0)
MCV: 68.6 fL — ABNORMAL LOW (ref 78.0–100.0)
Platelets: 169 10*3/uL (ref 150–400)
RBC: 5.1 MIL/uL (ref 4.22–5.81)
RDW: 17.1 % — ABNORMAL HIGH (ref 11.5–15.5)
WBC: 5.4 10*3/uL (ref 4.0–10.5)

## 2014-12-29 LAB — I-STAT TROPONIN, ED
Troponin i, poc: 0.01 ng/mL (ref 0.00–0.08)
Troponin i, poc: 0 ng/mL (ref 0.00–0.08)

## 2014-12-29 MED ORDER — TRAMADOL HCL 50 MG PO TABS: 50.0000 mg | ORAL_TABLET | Freq: Four times a day (QID) | ORAL | Status: DC | PRN

## 2014-12-29 MED ORDER — ASPIRIN 325 MG PO TABS
325.0000 mg | ORAL_TABLET | Freq: Once | ORAL | Status: AC
  Administered 2014-12-29: 325 mg via ORAL
  Filled 2014-12-29 (×2): qty 1

## 2014-12-29 MED ORDER — TRAMADOL HCL 50 MG PO TABS
50.0000 mg | ORAL_TABLET | Freq: Once | ORAL | Status: AC
  Administered 2014-12-29: 50 mg via ORAL
  Filled 2014-12-29: qty 1

## 2014-12-29 NOTE — ED Provider Notes (Signed)
CSN: [id number]     Arrival date & time 12/29/14  1943 History   First MD Initiated Contact with Patient 12/29/14 2014     Chief Complaint  Patient presents with  . Chest Pain     (Consider location/radiation/quality/duration/timing/severity/associated sxs/prior Treatment) The history is provided by the patient.  Joel [name] is a 74 y.o. male hx of GERD, HL, HTN, CAD s/p 1 stent in 2014, DM, COPD, who presented with chest pain. Patient has left-sided chest pain for the last 3 days. He ran out of tramadol 3 days ago. States that his pain comes and goes and is not worse with exertion and is not pleuritic. Denies shortness of breath. Denies any leg swelling. He tried nitroglycerin with minimal relief. He had one cardiac stent in 2014 but his symptoms are different.    Past Medical History  Diagnosis Date  . GERD (gastroesophageal reflux disease)   . HLD (hyperlipidemia)   . HTN (hypertension)   . Cough secondary to angiotensin converting enzyme inhibitor (ACE-I)   . Peripheral vascular disease   . CAD (coronary artery disease)     A. Negative MV 04/2010;  B. Inadequate Stress Echo 05/2011;  C. 05/29/11 Cath: - Med Rx;  D. 05/2012 Cath/PCI: LM nl, LAD 40p, LCX ectatic, OM1 182m, OM2 90p (3.5x16 Veriflex BMS), RCA dominant 40p/d, PDA 50p, EF 55-65%.  . Iron deficiency anemia   . DVT (deep venous thrombosis)   . Microcytic anemia   . COPD (chronic obstructive pulmonary disease)   . Angina   . Shortness of breath     "can come on at anytime" (11/09/2012)  . History of blood transfusion     "I've had 2; don't know what it was related to" (11/09/2012)  . Rotator cuff injury     "left arm; never repaired" (11/09/2012)  . Headache(784.0)     Hx: of  . Cancer     Hx: of prostate  . Diabetes mellitus without complication   . Atypical chest pain    Past Surgical History  Procedure Laterality Date  . Iliac artery aneurysm repair    . Circumcision    . Pr vein bypass graft,aorto-fem-pop  Left 10/01/2009    left below knee popliteal artery to posterior tibial artery  . Cataract extraction w/ intraocular lens implant Right ~ 2008  . Coronary angioplasty with stent placement  2014    "1" (11/09/2012)  . Left heart cath  05-31-12  . Cardiovascular stress test  Aug. 2014  . Shoulder arthroscopy with open rotator cuff repair and distal clavicle acrominectomy Left 01/12/2013    Procedure: LEFT SHOULDER ARTHROSCOPY WITH DEBRIDEMENT OPEN DISTAL CLAVICLE RESECTION ,acromioplastyAND ROTATOR CUFF REPAIR;  Surgeon: Yvette Rack., MD;  Location: [facility];  Service: Orthopedics;  Laterality: Left;  . Left heart catheterization with coronary angiogram N/A 06/01/2011    Procedure: LEFT HEART CATHETERIZATION WITH CORONARY ANGIOGRAM;  Surgeon: Hillary Bow, MD;  Location: [facility] CATH LAB;  Service: Cardiovascular;  Laterality: N/A;  . Left heart catheterization with coronary angiogram N/A 05/31/2012    Procedure: LEFT HEART CATHETERIZATION WITH CORONARY ANGIOGRAM;  Surgeon: Peter M Martinique, MD;  Location: [facility] CATH LAB;  Service: Cardiovascular;  Laterality: N/A;  . Percutaneous coronary stent intervention (pci-s)  05/31/2012    Procedure: PERCUTANEOUS CORONARY STENT INTERVENTION (PCI-S);  Surgeon: Peter M Martinique, MD;  Location: [facility] CATH LAB;  Service: Cardiovascular;;  . Left heart catheterization with coronary angiogram N/A 08/01/2013    Procedure: LEFT  HEART CATHETERIZATION WITH CORONARY ANGIOGRAM;  Surgeon: Burnell Blanks, MD;  Location: [facility] CATH LAB;  Service: Cardiovascular;  Laterality: N/A;   Family History  Problem Relation Age of Onset  . Breast cancer Mother     deceased  . Cancer Mother   . Hyperlipidemia Mother   . Hypertension Mother   . COPD Father     deceased  . Cancer Father   . Hyperlipidemia Father   . Hypertension Father   . Hypertension Sister   . Cancer Brother   . Hypertension Brother   . Hypertension Daughter   . Hypertension Son    Social History  Substance Use  Topics  . Smoking status: Former Smoker -- 1.00 packs/day for 55 years    Types: Cigarettes    Quit date: 05/26/2011  . Smokeless tobacco: Never Used  . Alcohol Use: No    Review of Systems  Cardiovascular: Positive for chest pain.  All other systems reviewed and are negative.     Allergies  Atenolol; Imdur; Lisinopril; and Topiramate  Home Medications   Prior to Admission medications   Medication Sig Start Date End Date Taking? Authorizing Provider  amLODipine (NORVASC) 10 MG tablet Take 1 tablet (10 mg total) by mouth daily. 05/04/14   Wandra Arthurs, MD  aspirin EC 81 MG tablet Take 81 mg by mouth daily.     Historical Provider, MD  Iron TABS Take 1 tablet by mouth daily.     Historical Provider, MD  losartan-hydrochlorothiazide (HYZAAR) 100-25 MG per tablet Take 1 tablet by mouth daily. 09/18/14   Burnell Blanks, MD  metFORMIN (GLUCOPHAGE) 500 MG tablet Take 500 mg by mouth 2 (two) times daily. 06/13/14   Historical Provider, MD  nitroGLYCERIN (NITROSTAT) 0.4 MG SL tablet Place 1 tablet (0.4 mg total) under the tongue every 5 (five) minutes as needed for chest pain. 09/13/14   Burnell Blanks, MD  potassium chloride (K-DUR,KLOR-CON) 10 MEQ tablet take 1 tablet by mouth once daily 08/02/13   Burnell Blanks, MD  pravastatin (PRAVACHOL) 40 MG tablet Take 40 mg by mouth daily.  06/13/14   Historical Provider, MD  traMADol (ULTRAM) 50 MG tablet Take 50-100 mg by mouth 3 (three) times daily as needed for moderate pain. For pain. Maximum dose= 8 tablets per day 11/10/12   Evelene Croon Barrett, PA-C   BP 129/64 mmHg  Pulse 51  Temp(Src) 98.2 F (36.8 C) (Oral)  Resp 15  Ht 5\' 11"  (1.803 m)  Wt 236 lb (107.049 kg)  BMI 32.93 kg/m2  SpO2 98% Physical Exam  Constitutional: He is oriented to person, place, and time. He appears well-developed and well-nourished.  HENT:  Head: Normocephalic.  Mouth/Throat: Oropharynx is clear and moist.  Eyes: Conjunctivae are normal. Pupils  are equal, round, and reactive to light.  Neck: Normal range of motion. Neck supple.  Cardiovascular: Normal rate, regular rhythm and normal heart sounds.   Pulmonary/Chest: Effort normal and breath sounds normal. No respiratory distress. He has no wheezes. He has no rales. He exhibits no tenderness.  Abdominal: Soft. Bowel sounds are normal. He exhibits no distension. There is no tenderness. There is no rebound.  Musculoskeletal: Normal range of motion.  Neurological: He is alert and oriented to person, place, and time. No cranial nerve deficit. Coordination normal.  Skin: Skin is warm and dry.  Psychiatric: He has a normal mood and affect. His behavior is normal. Judgment and thought content normal.  Nursing note and vitals  reviewed.   ED Course  Procedures (including critical care time) Labs Review Labs Reviewed  BASIC METABOLIC PANEL - Abnormal; Notable for the following:    Glucose, Bld 113 (*)    Creatinine, Ser 1.38 (*)    GFR calc non Af Amer 48 (*)    GFR calc Af Amer 56 (*)    All other components within normal limits  CBC - Abnormal; Notable for the following:    Hemoglobin 10.8 (*)    HCT 35.0 (*)    MCV 68.6 (*)    MCH 21.2 (*)    RDW 17.1 (*)    All other components within normal limits  I-STAT TROPOININ, ED  Randolm Idol, ED    Imaging Review Dg Chest 2 View  12/29/2014   CLINICAL DATA:  Left-sided chest pain for 2 days. Initial encounter.  EXAM: CHEST  2 VIEW  COMPARISON:  12/20/2014 and 09/10/2014.  FINDINGS: Mildly suboptimal inspiration. The heart size and mediastinal contours are normal. The lungs are clear. There is no pleural effusion or pneumothorax. No acute osseous findings are identified. Thoracic spine degenerative changes noted.  IMPRESSION: No active cardiopulmonary process.   Electronically Signed   By: Richardean Sale M.D.   On: 12/29/2014 20:25   I have personally reviewed and evaluated these images and lab results as part of my medical  decision-making.   EKG Interpretation   Date/Time:  Sunday December 29 2014 19:48:43 EDT Ventricular Rate:  63 PR Interval:  234 QRS Duration: 94 QT Interval:  402 QTC Calculation: 411 R Axis:   51 Text Interpretation:  Sinus rhythm with 1st degree A-V block Otherwise  normal ECG No significant change since last tracing Confirmed by Malic Rosten  MD,  Deasiah Hagberg ([id number]) on 12/29/2014 8:20:39 PM      MDM   Final diagnoses:  None    Joel [name] is a 74 y.o. male here with chest pain. Has one stent but pain is atypical for ACS. Will give ASA, tramadol and get delta trop. I doubt PE or dissection.   11:53 PM Delta trop neg. Labs at baseline. CXR nl. Will dc home. Recommend stress test outpatient with PCP.    Wandra Arthurs, MD 12/29/14 [phone]

## 2014-12-29 NOTE — ED Notes (Signed)
Pt c/o left sided chest pain starting on Friday. States that this is recurrent pain. Pt states that he normally takes Tramadol for the pain but ran out and cannot get ahold of his Dr. Quintella Baton that he take Tramadol for the chest pain. States that he tried a couple NTG with no relief.

## 2014-12-29 NOTE — Discharge Instructions (Signed)
Take tramadol as needed for pain.   See your doctor and schedule for a stress test.   Take ASA 81 mg daily.  Return to ER if you have severe chest pain, shortness of breath.

## 2014-12-30 NOTE — ED Notes (Signed)
Patient is alert and orientedx4.  Patient was explained discharge instructions and they understood them with no questions.   

## 2014-12-31 ENCOUNTER — Ambulatory Visit: Payer: PPO | Admitting: Physician Assistant

## 2015-01-01 ENCOUNTER — Telehealth: Payer: Self-pay | Admitting: Cardiovascular Disease

## 2015-01-01 NOTE — Telephone Encounter (Signed)
Returned patients call.  Patient stated that he had been having CP since last Friday, 12-27-14. Denies sob,nausea. The pain comes and goes.  Isn't having pain today.  Patient said that he was evaluated in the ED on Sunday 12-29-14 for the chest pain with recommendation to see his PCP for a stress test.  Cancelled appt with Melina Copa on 12-31-14.  I recommended that he see his PCP and show him his ED discharge instructions concerning the recommendation to see PCP.  Patient was very disparaging of the ED and his PCP in our conversation.  Expressed understanding of my recommendation.

## 2015-01-01 NOTE — Telephone Encounter (Signed)
New message   Pt c/o of Chest Pain: STAT if CP now or developed within 24 hours  1. Are you having CP right now? no  2. Are you experiencing any other symptoms (ex. SOB, nausea, vomiting, sweating)? no  3. How long have you been experiencing CP? Since last friday  4. Is your CP continuous or coming and going?  Coming and going  5. Have you taken Nitroglycerin? Yes he tried it once ?

## 2015-02-20 ENCOUNTER — Encounter (HOSPITAL_COMMUNITY): Payer: Self-pay | Admitting: *Deleted

## 2015-02-20 ENCOUNTER — Emergency Department (HOSPITAL_COMMUNITY)
Admission: EM | Admit: 2015-02-20 | Discharge: 2015-02-20 | Disposition: A | Discharge status: Home / Self Care | Payer: PPO | Attending: Emergency Medicine | Admitting: Emergency Medicine

## 2015-02-20 ENCOUNTER — Emergency Department (HOSPITAL_COMMUNITY): Payer: PPO

## 2015-02-20 DIAGNOSIS — M79622 Pain in left upper arm: Secondary | ICD-10-CM | POA: Diagnosis not present

## 2015-02-20 DIAGNOSIS — Z8719 Personal history of other diseases of the digestive system: Secondary | ICD-10-CM | POA: Insufficient documentation

## 2015-02-20 DIAGNOSIS — J449 Chronic obstructive pulmonary disease, unspecified: Secondary | ICD-10-CM | POA: Insufficient documentation

## 2015-02-20 DIAGNOSIS — Z8781 Personal history of (healed) traumatic fracture: Secondary | ICD-10-CM | POA: Insufficient documentation

## 2015-02-20 DIAGNOSIS — R079 Chest pain, unspecified: Secondary | ICD-10-CM | POA: Diagnosis present

## 2015-02-20 DIAGNOSIS — I25119 Atherosclerotic heart disease of native coronary artery with unspecified angina pectoris: Secondary | ICD-10-CM | POA: Insufficient documentation

## 2015-02-20 DIAGNOSIS — Z7982 Long term (current) use of aspirin: Secondary | ICD-10-CM | POA: Diagnosis not present

## 2015-02-20 DIAGNOSIS — Z8546 Personal history of malignant neoplasm of prostate: Secondary | ICD-10-CM | POA: Insufficient documentation

## 2015-02-20 DIAGNOSIS — Z79899 Other long term (current) drug therapy: Secondary | ICD-10-CM | POA: Diagnosis not present

## 2015-02-20 DIAGNOSIS — E119 Type 2 diabetes mellitus without complications: Secondary | ICD-10-CM | POA: Insufficient documentation

## 2015-02-20 DIAGNOSIS — Z87891 Personal history of nicotine dependence: Secondary | ICD-10-CM | POA: Insufficient documentation

## 2015-02-20 DIAGNOSIS — Z86718 Personal history of other venous thrombosis and embolism: Secondary | ICD-10-CM | POA: Insufficient documentation

## 2015-02-20 DIAGNOSIS — I10 Essential (primary) hypertension: Secondary | ICD-10-CM | POA: Diagnosis not present

## 2015-02-20 DIAGNOSIS — E785 Hyperlipidemia, unspecified: Secondary | ICD-10-CM | POA: Diagnosis not present

## 2015-02-20 DIAGNOSIS — Z862 Personal history of diseases of the blood and blood-forming organs and certain disorders involving the immune mechanism: Secondary | ICD-10-CM | POA: Diagnosis not present

## 2015-02-20 LAB — CBC
HCT: 36 % — ABNORMAL LOW (ref 39.0–52.0)
Hemoglobin: 10.9 g/dL — ABNORMAL LOW (ref 13.0–17.0)
MCH: 20.9 pg — ABNORMAL LOW (ref 26.0–34.0)
MCHC: 30.3 g/dL (ref 30.0–36.0)
MCV: 69 fL — ABNORMAL LOW (ref 78.0–100.0)
Platelets: 185 10*3/uL (ref 150–400)
RBC: 5.22 MIL/uL (ref 4.22–5.81)
RDW: 17.3 % — ABNORMAL HIGH (ref 11.5–15.5)
WBC: 4.8 10*3/uL (ref 4.0–10.5)

## 2015-02-20 LAB — BASIC METABOLIC PANEL
Anion gap: 8 (ref 5–15)
BUN: 12 mg/dL (ref 6–20)
CO2: 29 mmol/L (ref 22–32)
Calcium: 9.6 mg/dL (ref 8.9–10.3)
Chloride: 104 mmol/L (ref 101–111)
Creatinine, Ser: 1.37 mg/dL — ABNORMAL HIGH (ref 0.61–1.24)
GFR calc Af Amer: 57 mL/min — ABNORMAL LOW (ref >60)
GFR calc non Af Amer: 49 mL/min — ABNORMAL LOW (ref >60)
Glucose, Bld: 83 mg/dL (ref 65–99)
Potassium: 4.4 mmol/L (ref 3.5–5.1)
Sodium: 141 mmol/L (ref 135–145)

## 2015-02-20 LAB — TROPONIN I
Troponin I: <0.03 ng/mL (ref <0.031)
Troponin I: <0.03 ng/mL (ref <0.031)

## 2015-02-20 NOTE — Discharge Instructions (Signed)
Nonspecific Chest Pain Call Dr. Lina Sar office tomorrow to let him know that you're here. He may want to see you in the office. It is often hard to find the cause of chest pain. There is always a chance that your pain could be related to something serious, such as a heart attack or a blood clot in your lungs. Chest pain can also be caused by conditions that are not life-threatening. If you have chest pain, it is very important to follow up with your doctor.  HOME CARE  If you were prescribed an antibiotic medicine, finish it all even if you start to feel better.  Avoid any activities that cause chest pain.  Do not use any tobacco products, including cigarettes, chewing tobacco, or electronic cigarettes. If you need help quitting, ask your doctor.  Do not drink alcohol.  Take medicines only as told by your doctor.  Keep all follow-up visits as told by your doctor. This is important. This includes any further testing if your chest pain does not go away.  Your doctor may tell you to keep your head raised (elevated) while you sleep.  Make lifestyle changes as told by your doctor. These may include:  Getting regular exercise. Ask your doctor to suggest some activities that are safe for you.  Eating a heart-healthy diet. Your doctor or a diet specialist (dietitian) can help you to learn healthy eating options.  Maintaining a healthy weight.  Managing diabetes, if necessary.  Reducing stress. GET HELP IF:  Your chest pain does not go away, even after treatment.  You have a rash with blisters on your chest.  You have a fever. GET HELP RIGHT AWAY IF:  Your chest pain is worse.  You have an increasing cough, or you cough up blood.  You have severe belly (abdominal) pain.  You feel extremely weak.  You pass out (faint).  You have chills.  You have sudden, unexplained chest discomfort.  You have sudden, unexplained discomfort in your arms, back, neck, or jaw.  You have  shortness of breath at any time.  You suddenly start to sweat, or your skin gets clammy.  You feel nauseous.  You vomit.  You suddenly feel light-headed or dizzy.  Your heart begins to beat quickly, or it feels like it is skipping beats. These symptoms may be an emergency. Do not wait to see if the symptoms will go away. Get medical help right away. Call your local emergency services (911 in the [place] Do not drive yourself to the hospital.   This information is not intended to replace advice given to you by your health care provider. Make sure you discuss any questions you have with your health care provider.   Document Released: 09/15/2007 Document Revised: 04/19/2014 Document Reviewed: 11/02/2013 Elsevier Interactive Patient Education [employer].

## 2015-02-20 NOTE — ED Notes (Signed)
Pt not in room.

## 2015-02-20 NOTE — ED Provider Notes (Signed)
CSN: [id number]     Arrival date & time 02/20/15  1432 History   First MD Initiated Contact with Patient 02/20/15 1509     Chief Complaint  Patient presents with  . Arm Pain     (Consider location/radiation/quality/duration/timing/severity/associated sxs/prior Treatment) HPI Patient developed left upper arm pain and left lateral chest pain while at rest 12:30 PM today lasted 1 hour, resolved spontaneously. It was preceded by "a funny smell" and shortness of breath which lasted for 1 second. He waited , was planning on going home and then again developed funny smell and shortness of breath lasting 1 second. He did not develop any further episodes of chest pain. However, on developing a funny smell and shortness of breath he decided to come to the emergency department to get "checked out. He is presently asymptomatic without treatment. Nothing makes symptoms better or worse. Past Medical History  Diagnosis Date  . GERD (gastroesophageal reflux disease)   . HLD (hyperlipidemia)   . HTN (hypertension)   . Cough secondary to angiotensin converting enzyme inhibitor (ACE-I)   . Peripheral vascular disease ([place])   . CAD (coronary artery disease)     A. Negative MV 04/2010;  B. Inadequate Stress Echo 05/2011;  C. 05/29/11 Cath: - Med Rx;  D. 05/2012 Cath/PCI: LM nl, LAD 40p, LCX ectatic, OM1 168m, OM2 90p (3.5x16 Veriflex BMS), RCA dominant 40p/d, PDA 50p, EF 55-65%.  . Iron deficiency anemia   . DVT (deep venous thrombosis) ([place])   . Microcytic anemia   . COPD (chronic obstructive pulmonary disease) ([place])   . Angina   . Shortness of breath     "can come on at anytime" (11/09/2012)  . History of blood transfusion     "I've had 2; don't know what it was related to" (11/09/2012)  . Rotator cuff injury     "left arm; never repaired" (11/09/2012)  . Headache(784.0)     Hx: of  . Cancer ([place])     Hx: of prostate  . Diabetes mellitus without complication ([place])   . Atypical chest pain    Past  Surgical History  Procedure Laterality Date  . Iliac artery aneurysm repair    . Circumcision    . Pr vein bypass graft,aorto-fem-pop Left 10/01/2009    left below knee popliteal artery to posterior tibial artery  . Cataract extraction w/ intraocular lens implant Right ~ 2008  . Coronary angioplasty with stent placement  2014    "1" (11/09/2012)  . Left heart cath  05-31-12  . Cardiovascular stress test  Aug. 2014  . Shoulder arthroscopy with open rotator cuff repair and distal clavicle acrominectomy Left 01/12/2013    Procedure: LEFT SHOULDER ARTHROSCOPY WITH DEBRIDEMENT OPEN DISTAL CLAVICLE RESECTION ,acromioplastyAND ROTATOR CUFF REPAIR;  Surgeon: Yvette Rack., MD;  Location: [facility];  Service: Orthopedics;  Laterality: Left;  . Left heart catheterization with coronary angiogram N/A 06/01/2011    Procedure: LEFT HEART CATHETERIZATION WITH CORONARY ANGIOGRAM;  Surgeon: Hillary Bow, MD;  Location: [facility] CATH LAB;  Service: Cardiovascular;  Laterality: N/A;  . Left heart catheterization with coronary angiogram N/A 05/31/2012    Procedure: LEFT HEART CATHETERIZATION WITH CORONARY ANGIOGRAM;  Surgeon: Peter M Martinique, MD;  Location: [facility] CATH LAB;  Service: Cardiovascular;  Laterality: N/A;  . Percutaneous coronary stent intervention (pci-s)  05/31/2012    Procedure: PERCUTANEOUS CORONARY STENT INTERVENTION (PCI-S);  Surgeon: Peter M Martinique, MD;  Location: [facility] CATH LAB;  Service: Cardiovascular;;  . Left heart  catheterization with coronary angiogram N/A 08/01/2013    Procedure: LEFT HEART CATHETERIZATION WITH CORONARY ANGIOGRAM;  Surgeon: Burnell Blanks, MD;  Location: [facility] C[facility] LAB;  Service: Cardiovascular;  Laterality: N/A;   Family History  Problem Relation Age of Onset  . Breast cancer Mother     deceased  . Cancer Mother   . Hyperlipidemia Mother   . Hypertension Mother   . COPD Father     deceased  . Cancer Father   . Hyperlipidemia Father   . Hypertension Father   . Hypertension  Sister   . Cancer Brother   . Hypertension Brother   . Hypertension Daughter   . Hypertension Son    Social History  Substance Use Topics  . Smoking status: Former Smoker -- 1.00 packs/day for 55 years    Types: Cigarettes    Quit date: 05/26/2011  . Smokeless tobacco: Never Used  . Alcohol Use: No    Review of Systems  Constitutional: Negative.   HENT: Negative.   Respiratory: Negative.   Cardiovascular: Positive for chest pain.  Gastrointestinal: Negative.   Musculoskeletal: Negative.   Skin: Negative.   Allergic/Immunologic: Positive for immunocompromised state.       Diabetic  Neurological: Negative.   Psychiatric/Behavioral: Negative.   All other systems reviewed and are negative.     Allergies  Atenolol; Imdur; Lisinopril; and Topiramate  Home Medications   Prior to Admission medications   Medication Sig Start Date End Date Taking? Authorizing Provider  amLODipine (NORVASC) 10 MG tablet Take 1 tablet (10 mg total) by mouth daily. 05/04/14   Wandra Arthurs, MD  aspirin EC 81 MG tablet Take 81 mg by mouth daily.     Historical Provider, MD  Iron TABS Take 1 tablet by mouth daily.     Historical Provider, MD  losartan-hydrochlorothiazide (HYZAAR) 100-25 MG per tablet Take 1 tablet by mouth daily. 09/18/14   Burnell Blanks, MD  metFORMIN (GLUCOPHAGE) 500 MG tablet Take 500 mg by mouth 2 (two) times daily. 06/13/14   Historical Provider, MD  nitroGLYCERIN (NITROSTAT) 0.4 MG SL tablet Place 1 tablet (0.4 mg total) under the tongue every 5 (five) minutes as needed for chest pain. 09/13/14   Burnell Blanks, MD  potassium chloride (K-DUR,KLOR-CON) 10 MEQ tablet take 1 tablet by mouth once daily 08/02/13   Burnell Blanks, MD  pravastatin (PRAVACHOL) 40 MG tablet Take 40 mg by mouth daily.  06/13/14   Historical Provider, MD  traMADol (ULTRAM) 50 MG tablet Take 1 tablet (50 mg total) by mouth every 6 (six) hours as needed. 12/29/14   Wandra Arthurs, MD   BP 134/74  mmHg  Pulse 56  Temp(Src) 97.8 F (36.6 C) (Oral)  Resp 16  SpO2 97% Physical Exam  Constitutional: He is oriented to person, place, and time. He appears well-developed and well-nourished.  HENT:  Head: Normocephalic and atraumatic.  Eyes: Conjunctivae are normal. Pupils are equal, round, and reactive to light.  Neck: Neck supple. No tracheal deviation present. No thyromegaly present.  Cardiovascular: Normal rate and regular rhythm.   No murmur heard. Pulmonary/Chest: Effort normal and breath sounds normal.  Abdominal: Soft. Bowel sounds are normal. He exhibits no distension. There is no tenderness.  Musculoskeletal: Normal range of motion. He exhibits no edema or tenderness.  All 4 extremities without redness swelling or tenderness neurovascularly intact  Neurological: He is alert and oriented to person, place, and time. Coordination normal.  Skin: Skin is warm and  dry. No rash noted.  Psychiatric: He has a normal mood and affect.  Nursing note and vitals reviewed.  Chest x-ray viewed by me ED Course  Procedures (including critical care time) Labs Review Labs Reviewed  BASIC METABOLIC PANEL  CBC    Imaging Review No results found. I have personally reviewed and evaluated these images and lab results as part of my medical decision-making. Chest x-ray viewed by me  EKG Interpretation   Date/Time:  Thursday February 20 2015 14:40:23 EST Ventricular Rate:  62 PR Interval:  230 QRS Duration: 94 QT Interval:  410 QTC Calculation: 416 R Axis:   54 Text Interpretation:  Sinus rhythm with 1st degree A-V block Otherwise  normal ECG No significant change since last tracing Confirmed by  Winfred Leeds  MD, Mikele Sifuentes [phone]) on 02/20/2015 3:13:12 PM       Results for orders placed or performed during the hospital encounter of 0000000  Basic metabolic panel  Result Value Ref Range   Sodium 141 135 - 145 mmol/L   Potassium 4.4 3.5 - 5.1 mmol/L   Chloride 104 101 - 111 mmol/L   CO2  29 22 - 32 mmol/L   Glucose, Bld 83 65 - 99 mg/dL   BUN 12 6 - 20 mg/dL   Creatinine, Ser 1.37 (H) 0.61 - 1.24 mg/dL   Calcium 9.6 8.9 - 10.3 mg/dL   GFR calc non Af Amer 49 (L) >60 mL/min   GFR calc Af Amer 57 (L) >60 mL/min   Anion gap 8 5 - 15  CBC  Result Value Ref Range   WBC 4.8 4.0 - 10.5 K/uL   RBC 5.22 4.22 - 5.81 MIL/uL   Hemoglobin 10.9 (L) 13.0 - 17.0 g/dL   HCT 36.0 (L) 39.0 - 52.0 %   MCV 69.0 (L) 78.0 - 100.0 fL   MCH 20.9 (L) 26.0 - 34.0 pg   MCHC 30.3 30.0 - 36.0 g/dL   RDW 17.3 (H) 11.5 - 15.5 %   Platelets 185 150 - 400 K/uL  Troponin I  Result Value Ref Range   Troponin I <0.03 <0.031 ng/mL  Troponin I  Result Value Ref Range   Troponin I <0.03 <0.031 ng/mL   Dg Chest 2 View  02/20/2015  CLINICAL DATA:  Chest pain radiating to left arm EXAM: CHEST  2 VIEW COMPARISON:  December 29, 2014 FINDINGS: There is no edema or consolidation. The heart size and pulmonary vascularity are normal. No adenopathy. There is atherosclerotic calcification in the aorta. There is degenerative change in the thoracic spine. No pneumothorax. IMPRESSION: No edema or consolidation. Electronically Signed   By: Lowella Grip III M.D.   On: 02/20/2015 15:40    8 PM patient remains asymptomatic MDM  Chest bilaterally highly atypical for acute coronary syndrome. 2 negative troponins. Heart score equals 3. Renal insufficiency is chronic Final diagnoses:  None   plan follow-up with Dr. Delfina Redwood Diagnosis #1 atypical chest pain #2 chronic renal insufficiency      Orlie Dakin, MD 02/20/15 2005

## 2015-02-20 NOTE — ED Notes (Signed)
Pt reports left arm pain. Pt reports has had rotator cuff surgery in the past. Pt also reports left chest "tingling".

## 2015-02-24 ENCOUNTER — Encounter: Payer: Self-pay | Admitting: Family

## 2015-02-25 ENCOUNTER — Encounter: Payer: Self-pay | Admitting: Family

## 2015-02-26 ENCOUNTER — Ambulatory Visit (INDEPENDENT_AMBULATORY_CARE_PROVIDER_SITE_OTHER): Payer: PPO | Admitting: Family

## 2015-02-26 ENCOUNTER — Ambulatory Visit (INDEPENDENT_AMBULATORY_CARE_PROVIDER_SITE_OTHER)
Admission: RE | Admit: 2015-02-26 | Discharge: 2015-02-26 | Disposition: A | Discharge status: Home / Self Care | Payer: PPO | Source: Ambulatory Visit | Attending: Vascular Surgery | Admitting: Vascular Surgery

## 2015-02-26 ENCOUNTER — Encounter: Payer: Self-pay | Admitting: Family

## 2015-02-26 ENCOUNTER — Other Ambulatory Visit: Payer: Self-pay | Admitting: Vascular Surgery

## 2015-02-26 ENCOUNTER — Ambulatory Visit (HOSPITAL_COMMUNITY)
Admission: RE | Admit: 2015-02-26 | Discharge: 2015-02-26 | Disposition: A | Discharge status: Home / Self Care | Payer: PPO | Source: Ambulatory Visit | Attending: Family | Admitting: Family

## 2015-02-26 VITALS — BP 115/64 | HR 69 | Temp 97.4°F | Resp 16 | Ht 70.0 in | Wt 235.0 lb

## 2015-02-26 DIAGNOSIS — Z48812 Encounter for surgical aftercare following surgery on the circulatory system: Secondary | ICD-10-CM | POA: Diagnosis not present

## 2015-02-26 DIAGNOSIS — I739 Peripheral vascular disease, unspecified: Secondary | ICD-10-CM | POA: Diagnosis not present

## 2015-02-26 DIAGNOSIS — Z87891 Personal history of nicotine dependence: Secondary | ICD-10-CM | POA: Diagnosis not present

## 2015-02-26 DIAGNOSIS — Z95828 Presence of other vascular implants and grafts: Secondary | ICD-10-CM

## 2015-02-26 DIAGNOSIS — I779 Disorder of arteries and arterioles, unspecified: Secondary | ICD-10-CM | POA: Diagnosis not present

## 2015-02-26 NOTE — Patient Instructions (Signed)
Peripheral Vascular Disease Peripheral vascular disease (PVD) is a disease of the blood vessels that are not part of your heart and brain. A simple term for PVD is poor circulation. In most cases, PVD narrows the blood vessels that carry blood from your heart to the rest of your body. This can result in a decreased supply of blood to your arms, legs, and internal organs, like your stomach or kidneys. However, it most often affects a person's lower legs and feet. There are two types of PVD.  Organic PVD. This is the more common type. It is caused by damage to the structure of blood vessels.  Functional PVD. This is caused by conditions that make blood vessels contract and tighten (spasm). Without treatment, PVD tends to get worse over time. PVD can also lead to acute ischemic limb. This is when an arm or limb suddenly has trouble getting enough blood. This is a medical emergency. CAUSES Each type of PVD has many different causes. The most common cause of PVD is buildup of a fatty material (plaque) inside of your arteries (atherosclerosis). Small amounts of plaque can break off from the walls of the blood vessels and become lodged in a smaller artery. This blocks blood flow and can cause acute ischemic limb. Other common causes of PVD include:  Blood clots that form inside of blood vessels.  Injuries to blood vessels.  Diseases that cause inflammation of blood vessels or cause blood vessel spasms.  Health behaviors and health history that increase your risk of developing PVD. RISK FACTORS  You may have a greater risk of PVD if you:  Have a family history of PVD.  Have certain medical conditions, including:  High cholesterol.  Diabetes.  High blood pressure (hypertension).  Coronary heart disease.  Past problems with blood clots.  Past injury, such as burns or a broken bone. These may have damaged blood vessels in your limbs.  Buerger disease. This is caused by inflamed blood  vessels in your hands and feet.  Some forms of arthritis.  Rare birth defects that affect the arteries in your legs.  Use tobacco.  Do not get enough exercise.  Are obese.  Are age 50 or older. SIGNS AND SYMPTOMS  PVD may cause many different symptoms. Your symptoms depend on what part of your body is not getting enough blood. Some common signs and symptoms include:  Cramps in your lower legs. This may be a symptom of poor leg circulation (claudication).  Pain and weakness in your legs while you are physically active that goes away when you rest (intermittent claudication).  Leg pain when at rest.  Leg numbness, tingling, or weakness.  Coldness in a leg or foot, especially when compared with the other leg.  Skin or hair changes. These can include:  Hair loss.  Shiny skin.  Pale or bluish skin.  Thick toenails.  Inability to get or maintain an erection (erectile dysfunction). People with PVD are more prone to developing ulcers and sores on their toes, feet, or legs. These may take longer than normal to heal. DIAGNOSIS Your health care provider may diagnose PVD from your signs and symptoms. The health care provider will also do a physical exam. You may have tests to find out what is causing your PVD and determine its severity. Tests may include:  Blood pressure recordings from your arms and legs and measurements of the strength of your pulses (pulse volume recordings).  Imaging studies using sound waves to take pictures of   the blood flow through your blood vessels (Doppler ultrasound).  Injecting a dye into your blood vessels before having imaging studies using:  X-rays (angiogram or arteriogram).  Computer-generated X-rays (CT angiogram).  A powerful electromagnetic field and a computer (magnetic resonance angiogram or MRA). TREATMENT Treatment for PVD depends on the cause of your condition and the severity of your symptoms. It also depends on your age. Underlying  causes need to be treated and controlled. These include long-lasting (chronic) conditions, such as diabetes, high cholesterol, and high blood pressure. You may need to first try making lifestyle changes and taking medicines. Surgery may be needed if these do not work. Lifestyle changes may include:  Quitting smoking.  Exercising regularly.  Following a low-fat, low-cholesterol diet. Medicines may include:  Blood thinners to prevent blood clots.  Medicines to improve blood flow.  Medicines to improve your blood cholesterol levels. Surgical procedures may include:  A procedure that uses an inflated balloon to open a blocked artery and improve blood flow (angioplasty).  A procedure to put in a tube (stent) to keep a blocked artery open (stent implant).  Surgery to reroute blood flow around a blocked artery (peripheral bypass surgery).  Surgery to remove dead tissue from an infected wound on the affected limb.  Amputation. This is surgical removal of the affected limb. This may be necessary in cases of acute ischemic limb that are not improved through medical or surgical treatments. HOME CARE INSTRUCTIONS  Take medicines only as directed by your health care provider.  Do not use any tobacco products, including cigarettes, chewing tobacco, or electronic cigarettes. If you need help quitting, ask your health care provider.  Lose weight if you are overweight, and maintain a healthy weight as directed by your health care provider.  Eat a diet that is low in fat and cholesterol. If you need help, ask your health care provider.  Exercise regularly. Ask your health care provider to suggest some good activities for you.  Use compression stockings or other mechanical devices as directed by your health care provider.  Take good care of your feet.  Wear comfortable shoes that fit well.  Check your feet often for any cuts or sores. SEEK MEDICAL CARE IF:  You have cramps in your legs  while walking.  You have leg pain when you are at rest.  You have coldness in a leg or foot.  Your skin changes.  You have erectile dysfunction.  You have cuts or sores on your feet that are not healing. SEEK IMMEDIATE MEDICAL CARE IF:  Your arm or leg turns cold and blue.  Your arms or legs become red, warm, swollen, painful, or numb.  You have chest pain or trouble breathing.  You suddenly have weakness in your face, arm, or leg.  You become very confused or lose the ability to speak.  You suddenly have a very bad headache or lose your vision.   This information is not intended to replace advice given to you by your health care provider. Make sure you discuss any questions you have with your health care provider.   Document Released: 05/06/2004 Document Revised: 04/19/2014 Document Reviewed: 09/06/2013 [employer] Interactive Patient Education 2016 [employer]  

## 2015-02-26 NOTE — Progress Notes (Signed)
[facility] [place] HISTORY AND PHYSICAL -PAD  History of Present Illness Joel [name] is a 74 y.o. male patient of Dr. Scot Dock who is s/p  left below knee popliteal to tibial artery bypass in June of 2011.  He comes in for yearly follow up visit.  He denies claudication or rest pain. He denies any history of nonhealing ulcers.  He does have a history of chronic back pain which is not new.  He quit smoking a long time ago. He walks a considerable amount of time in the course of his day.  The patient denies New Medical or Surgical History.  Pt Diabetic: Yes, metformin as his only glycemic control medication. Pt smoker: former smoker, quit about 2012, started at about age 24  Pt meds include: Statin :Yes Betablocker: No ASA: Yes Other anticoagulants/antiplatelets: no  Past Medical History  Diagnosis Date  . GERD (gastroesophageal reflux disease)   . HLD (hyperlipidemia)   . HTN (hypertension)   . Cough secondary to angiotensin converting enzyme inhibitor (ACE-I)   . Peripheral vascular disease ([place])   . CAD (coronary artery disease)     A. Negative MV 04/2010;  B. Inadequate Stress Echo 05/2011;  C. 05/29/11 Cath: - Med Rx;  D. 05/2012 Cath/PCI: LM nl, LAD 40p, LCX ectatic, OM1 175m, OM2 90p (3.5x16 Veriflex BMS), RCA dominant 40p/d, PDA 50p, EF 55-65%.  . Iron deficiency anemia   . DVT (deep venous thrombosis) ([place])   . Microcytic anemia   . COPD (chronic obstructive pulmonary disease) ([place])   . Angina   . Shortness of breath     "can come on at anytime" (11/09/2012)  . History of blood transfusion     "I've had 2; don't know what it was related to" (11/09/2012)  . Rotator cuff injury     "left arm; never repaired" (11/09/2012)  . Headache(784.0)     Hx: of  . Cancer ([place])     Hx: of prostate  . Diabetes mellitus without complication ([place])   . Atypical chest pain     Social History Social History  Substance Use Topics  . Smoking status: Former  Smoker -- 1.00 packs/day for 55 years    Types: Cigarettes    Quit date: 05/26/2011  . Smokeless tobacco: Never Used  . Alcohol Use: No    Family History Family History  Problem Relation Age of Onset  . Breast cancer Mother     deceased  . Cancer Mother   . Hyperlipidemia Mother   . Hypertension Mother   . COPD Father     deceased  . Cancer Father   . Hyperlipidemia Father   . Hypertension Father   . Hypertension Sister   . Cancer Brother   . Hypertension Brother   . Hypertension Daughter   . Hypertension Son     Past Surgical History  Procedure Laterality Date  . Iliac artery aneurysm repair    . Circumcision    . Pr vein bypass graft,aorto-fem-pop Left 10/01/2009    left below knee popliteal artery to posterior tibial artery  . Cataract extraction w/ intraocular lens implant Right ~ 2008  . Coronary angioplasty with stent placement  2014    "1" (11/09/2012)  . Left heart cath  05-31-12  . Cardiovascular stress test  Aug. 2014  . Shoulder arthroscopy with open rotator cuff repair and distal clavicle acrominectomy Left 01/12/2013    Procedure: LEFT SHOULDER ARTHROSCOPY WITH DEBRIDEMENT OPEN DISTAL CLAVICLE RESECTION ,acromioplastyAND  ROTATOR CUFF REPAIR;  Surgeon: Yvette Rack., MD;  Location: [facility];  Service: Orthopedics;  Laterality: Left;  . Left heart catheterization with coronary angiogram N/A 06/01/2011    Procedure: LEFT HEART CATHETERIZATION WITH CORONARY ANGIOGRAM;  Surgeon: Hillary Bow, MD;  Location: [facility] CATH LAB;  Service: Cardiovascular;  Laterality: N/A;  . Left heart catheterization with coronary angiogram N/A 05/31/2012    Procedure: LEFT HEART CATHETERIZATION WITH CORONARY ANGIOGRAM;  Surgeon: Peter M Martinique, MD;  Location: [facility] CATH LAB;  Service: Cardiovascular;  Laterality: N/A;  . Percutaneous coronary stent intervention (pci-s)  05/31/2012    Procedure: PERCUTANEOUS CORONARY STENT INTERVENTION (PCI-S);  Surgeon: Peter M Martinique, MD;  Location: [facility] CATH  LAB;  Service: Cardiovascular;;  . Left heart catheterization with coronary angiogram N/A 08/01/2013    Procedure: LEFT HEART CATHETERIZATION WITH CORONARY ANGIOGRAM;  Surgeon: Burnell Blanks, MD;  Location: [facility] CATH LAB;  Service: Cardiovascular;  Laterality: N/A;    Allergies  Allergen Reactions  . Atenolol     bradycardia  . Imdur [Isosorbide Mononitrate] Nausea Only and Other (See Comments)    Headache  . Lisinopril Other (See Comments)    Cough  . Topiramate     Shortness of breath and leg cramps  . Tramadol-Acetaminophen Other (See Comments)    intolerance    Current Outpatient Prescriptions  Medication Sig Dispense Refill  . albuterol (PROVENTIL HFA;VENTOLIN HFA) 108 (90 BASE) MCG/ACT inhaler Inhale 2 puffs into the lungs every 6 (six) hours as needed for wheezing or shortness of breath.    Marland Kitchen amLODipine (NORVASC) 10 MG tablet Take 1 tablet (10 mg total) by mouth daily. 30 tablet 0  . aspirin EC 81 MG tablet Take 81 mg by mouth daily.     . ferrous sulfate 325 (65 FE) MG tablet Take 325 mg by mouth daily with breakfast.    . losartan-hydrochlorothiazide (HYZAAR) 100-25 MG per tablet Take 1 tablet by mouth daily. 90 tablet 3  . metFORMIN (GLUCOPHAGE) 500 MG tablet Take 500 mg by mouth 2 (two) times daily.  0  . nitroGLYCERIN (NITROSTAT) 0.4 MG SL tablet Place 1 tablet (0.4 mg total) under the tongue every 5 (five) minutes as needed for chest pain. 25 tablet 6  . omeprazole (PRILOSEC) 20 MG capsule Take 20 mg by mouth 2 (two) times daily before a meal.    . potassium chloride (K-DUR,KLOR-CON) 10 MEQ tablet take 1 tablet by mouth once daily 30 tablet 1  . pravastatin (PRAVACHOL) 40 MG tablet Take 40 mg by mouth daily.   1  . traMADol (ULTRAM) 50 MG tablet Take 1 tablet (50 mg total) by mouth every 6 (six) hours as needed. (Patient taking differently: Take 50 mg by mouth every 6 (six) hours as needed for moderate pain. ) 15 tablet 0  . cyclobenzaprine (FLEXERIL) 10 MG tablet  take 1 tablet by mouth three times a day if needed  0  . methocarbamol (ROBAXIN) 750 MG tablet Take 750 mg by mouth every 8 (eight) hours.  0   No current facility-administered medications for this visit.    ROS: See HPI for pertinent positives and negatives.   Physical Examination  Filed Vitals:   02/26/15 1459  BP: 115/64  Pulse: 69  Temp: 97.4 F (36.3 C)  TempSrc: Oral  Resp: 16  Height: 5\' 10"  (1.778 m)  Weight: 235 lb (106.595 kg)  SpO2: 97%   Body mass index is 33.72 kg/(m^2).  General: A&O x 3,  WDWN. Gait: normal Eyes: PERRLA. Pulmonary: CTAB, no wheezes, rales or rhonchi. Cardiac: regular rhythm, no detected murmur.         Carotid Bruits Right Left   Negative Negative  Aorta is not palpable. Radial pulses: 2+ palpable and =                           VASCULAR EXAM: Extremities without ischemic changes, without Gangrene; without open wounds.                                                                                                          LE Pulses Right Left       FEMORAL  3+ palpable  3+ palpable        POPLITEAL  not palpable   not palpable       POSTERIOR TIBIAL  faintly palpable   1+ palpable        DORSALIS PEDIS      ANTERIOR TIBIAL faintly palpable  1+ palpable    Abdomen: soft, NT, no palpable masses. Skin: no rashes, no ulcers. Musculoskeletal: no muscle wasting or atrophy.  Neurologic: A&O X 3; Appropriate Affect ; SENSATION: normal; MOTOR FUNCTION:  moving all extremities equally, motor strength 5/5 throughout. Speech is fluent/normal.  CN 2-12 intact.    Non-Invasive Vascular Imaging: DATE: 02/26/2015 ABI: RIGHT: 0.94 (0.90, 12/19/13) Waveforms: biphasic PT, monophasic DP, TBI: 0.58;  LEFT:  1.3 (0.98), triphasic PT, biphasic DP, TBI: 0.66  DUPLEX SCAN OF BYPASS: No internal vessel narrowing within the bypass graft or anastomosis. Partially occlusive plaque and Doppler velocities suggest a greater than 50-75% stenosis of the  left mid-distal SFA. No significant change compared to 12/19/13 exam.    ASSESSMENT: LYAL SCULL is a 74 y.o. male who is s/p  left below knee popliteal to tibial artery bypass in June of 2011.  He has no claudication symptoms with walking, no signs of ischemia in his feet/legs. Today's left LE arterial duplex suggests no internal vessel narrowing within the bypass graft or anastomosis. Partially occlusive plaque and Doppler velocities suggest a greater than 50-75% stenosis of the left mid-distal SFA. No significant change compared to 12/19/13 exam.  The above was discussed with Dr. Scot Dock.   PLAN:  Graduated walking program. Based on the patient's vascular studies and examination, pt will return to clinic in 6 months with ABI's and left LE arterial duplex, see Dr. Dr. Scot Dock for discussion of results. Pt knows to notify us should he experience claudication symptoms in his left leg.  I discussed in depth with the patient the nature of atherosclerosis, and emphasized the importance of maximal medical management including strict control of blood pressure, blood glucose, and lipid levels, obtaining regular exercise, and continued cessation of smoking.  The patient is aware that without maximal medical management the underlying atherosclerotic disease process will progress, limiting the benefit of any interventions.  The patient was given information about PAD including signs, symptoms, treatment, what symptoms should prompt the patient to seek immediate  medical care, and risk reduction measures to take.  Clemon Chambers, RN, MSN, FNP-C [facility] Phone: [phone]  Clinic MD: Scot Dock  02/26/2015 3:37 PM

## 2015-04-11 ENCOUNTER — Encounter: Payer: PPO | Admitting: Cardiovascular Disease

## 2015-04-11 NOTE — Progress Notes (Signed)
No show

## 2015-04-16 ENCOUNTER — Ambulatory Visit (INDEPENDENT_AMBULATORY_CARE_PROVIDER_SITE_OTHER): Payer: PPO | Admitting: Cardiovascular Disease

## 2015-04-16 ENCOUNTER — Encounter: Payer: Self-pay | Admitting: Cardiovascular Disease

## 2015-04-16 VITALS — BP 128/62 | HR 71 | Ht 70.0 in | Wt 244.4 lb

## 2015-04-16 DIAGNOSIS — E785 Hyperlipidemia, unspecified: Secondary | ICD-10-CM

## 2015-04-16 DIAGNOSIS — I2510 Atherosclerotic heart disease of native coronary artery without angina pectoris: Secondary | ICD-10-CM

## 2015-04-16 MED ORDER — NITROGLYCERIN 0.4 MG SL SUBL: 0.4000 mg | SUBLINGUAL_TABLET | SUBLINGUAL | Status: DC | PRN

## 2015-04-16 MED ORDER — PRAVASTATIN SODIUM 40 MG PO TABS: 40.0000 mg | ORAL_TABLET | Freq: Every day | ORAL | Status: DC

## 2015-04-16 NOTE — Progress Notes (Signed)
Chief Complaint  Patient presents with  . Chest Pain     History of Present Illness: 75 yo male with history of HTN, HLD, CAD, PAD, DM who is here today for cardiac follow up. He has been followed in the past by Dr. Lia Foyer. Cardiac cath February 2014 with moderate LAD and RCA disease, severe OM2 stenosis treated with bare metal stent. [facility] with chest pain. Ruled out for MI. Lexiscan stress myoview 11/16/12 without ischemia. Normal LVEF.  I saw him in the office 07/27/13 and he c/o exertional chest pain c/w unstable angina. Cardiac cath 08/01/13 with patent stent Circumflex and mild to moderate disease in the LAD and RCA. His PAD is followed in [facility] by Dr. Scot Dock. Seen in the ED by Dr. Ron Parker 12/15/13 with c/o chest pain. Negative troponin and discharged home. He was seen here 03/04/14 and had c/o chest pain after working on his car but quickly resolved. He was seen in the ED 09/10/14 with atypical chest pain. Troponin was negative x 2 and EKG was unchanged.   He is here today for follow up. He has occasional mild, sharp chest pains that happen several days per week. These pains last for a few seconds. This has been consistent for years. He has no exertional pain or dyspnea. No SOB, dizziness, diaphoresis. He has some back pain. No LE edema.   Primary Care Physician:  Seward Carol  Last Lipid Profile: Followed in primary care.    Past Medical History  Diagnosis Date  . GERD (gastroesophageal reflux disease)   . HLD (hyperlipidemia)   . HTN (hypertension)   . Cough secondary to angiotensin converting enzyme inhibitor (ACE-I)   . Peripheral vascular disease ([place])   . CAD (coronary artery disease)     A. Negative MV 04/2010;  B. Inadequate Stress Echo 05/2011;  C. 05/29/11 Cath: - Med Rx;  D. 05/2012 Cath/PCI: LM nl, LAD 40p, LCX ectatic, OM1 152m, OM2 90p (3.5x16 Veriflex BMS), RCA dominant 40p/d, PDA 50p, EF 55-65%.  . Iron deficiency anemia   . DVT (deep venous thrombosis) ([place])    . Microcytic anemia   . COPD (chronic obstructive pulmonary disease) ([place])   . Angina   . Shortness of breath     "can come on at anytime" (11/09/2012)  . History of blood transfusion     "I've had 2; don't know what it was related to" (11/09/2012)  . Rotator cuff injury     "left arm; never repaired" (11/09/2012)  . Headache(784.0)     Hx: of  . Cancer ([place])     Hx: of prostate  . Diabetes mellitus without complication ([place])   . Atypical chest pain     Past Surgical History  Procedure Laterality Date  . Iliac artery aneurysm repair    . Circumcision    . Pr vein bypass graft,aorto-fem-pop Left 10/01/2009    left below knee popliteal artery to posterior tibial artery  . Cataract extraction w/ intraocular lens implant Right ~ 2008  . Coronary angioplasty with stent placement  2014    "1" (11/09/2012)  . Left heart cath  05-31-12  . Cardiovascular stress test  Aug. 2014  . Shoulder arthroscopy with open rotator cuff repair and distal clavicle acrominectomy Left 01/12/2013    Procedure: LEFT SHOULDER ARTHROSCOPY WITH DEBRIDEMENT OPEN DISTAL CLAVICLE RESECTION ,acromioplastyAND ROTATOR CUFF REPAIR;  Surgeon: Yvette Rack., MD;  Location: [facility];  Service: Orthopedics;  Laterality: Left;  .  Left heart catheterization with coronary angiogram N/A 06/01/2011    Procedure: LEFT HEART CATHETERIZATION WITH CORONARY ANGIOGRAM;  Surgeon: Hillary Bow, MD;  Location: [facility] CATH LAB;  Service: Cardiovascular;  Laterality: N/A;  . Left heart catheterization with coronary angiogram N/A 05/31/2012    Procedure: LEFT HEART CATHETERIZATION WITH CORONARY ANGIOGRAM;  Surgeon: Peter M Martinique, MD;  Location: [facility] CATH LAB;  Service: Cardiovascular;  Laterality: N/A;  . Percutaneous coronary stent intervention (pci-s)  05/31/2012    Procedure: PERCUTANEOUS CORONARY STENT INTERVENTION (PCI-S);  Surgeon: Peter M Martinique, MD;  Location: [facility] CATH LAB;  Service: Cardiovascular;;  . Left heart catheterization with  coronary angiogram N/A 08/01/2013    Procedure: LEFT HEART CATHETERIZATION WITH CORONARY ANGIOGRAM;  Surgeon: Burnell Blanks, MD;  Location: [facility] CATH LAB;  Service: Cardiovascular;  Laterality: N/A;    Current Outpatient Prescriptions  Medication Sig Dispense Refill  . albuterol (PROVENTIL HFA;VENTOLIN HFA) 108 (90 BASE) MCG/ACT inhaler Inhale 2 puffs into the lungs every 6 (six) hours as needed for wheezing or shortness of breath.    Marland Kitchen amLODipine (NORVASC) 10 MG tablet Take 1 tablet (10 mg total) by mouth daily. 30 tablet 0  . aspirin EC 81 MG tablet Take 81 mg by mouth daily.     . ferrous sulfate 325 (65 FE) MG tablet Take 325 mg by mouth daily with breakfast.    . losartan-hydrochlorothiazide (HYZAAR) 100-25 MG per tablet Take 1 tablet by mouth daily. 90 tablet 3  . metFORMIN (GLUCOPHAGE) 500 MG tablet Take 500 mg by mouth 2 (two) times daily.  0  . methocarbamol (ROBAXIN) 750 MG tablet Take 750 mg by mouth every 8 (eight) hours.  0  . nitroGLYCERIN (NITROSTAT) 0.4 MG SL tablet Place 1 tablet (0.4 mg total) under the tongue every 5 (five) minutes as needed for chest pain. 25 tablet 6  . omeprazole (PRILOSEC) 20 MG capsule Take 20 mg by mouth 2 (two) times daily before a meal.    . potassium chloride (K-DUR,KLOR-CON) 10 MEQ tablet take 1 tablet by mouth once daily 30 tablet 1  . pravastatin (PRAVACHOL) 40 MG tablet Take 1 tablet (40 mg total) by mouth daily. 30 tablet 11  . traMADol (ULTRAM) 50 MG tablet Take 50 mg by mouth every 6 (six) hours as needed for moderate pain.     No current facility-administered medications for this visit.    Allergies  Allergen Reactions  . Atenolol     bradycardia  . Imdur [Isosorbide Mononitrate] Nausea Only and Other (See Comments)    Headache  . Lisinopril Other (See Comments)    Cough  . Topiramate     Shortness of breath and leg cramps  . Tramadol-Acetaminophen Other (See Comments)    intolerance    Social History   Social History    . Marital Status: Widowed    Spouse Name: N/A  . Number of Children: N/A  . Years of Education: N/A   Occupational History  . Retired    Social History Main Topics  . Smoking status: Former Smoker -- 1.00 packs/day for 55 years    Types: Cigarettes    Quit date: 05/26/2011  . Smokeless tobacco: Never Used  . Alcohol Use: No  . Drug Use: No  . Sexual Activity: Not on file     Comment: 11/09/2012 "I don't have to answer questions about sex"   Other Topics Concern  . Not on file   Social History Narrative   Single. Retired.  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  Lives in [place] by himself.    Family History  Problem Relation Age of Onset  . Breast cancer Mother     deceased  . Cancer Mother   . Hyperlipidemia Mother   . Hypertension Mother   . COPD Father     deceased  . Cancer Father   . Hyperlipidemia Father   . Hypertension Father   . Hypertension Sister   . Cancer Brother   . Hypertension Brother   . Hypertension Daughter   . Hypertension Son     Review of Systems:  As stated in the HPI and otherwise negative.   BP 128/62 mmHg  Pulse 71  Ht 5\' 10"  (1.778 m)  Wt 244 lb 6.4 oz (110.859 kg)  BMI 35.07 kg/m2  Physical Examination: General: Well developed, well nourished, NAD HEENT: OP clear, mucus membranes moist SKIN: warm, dry. No rashes. Neuro: No focal deficits Musculoskeletal: Muscle strength 5/5 all ext Psychiatric: Mood and affect normal Neck: No JVD, no carotid bruits, no thyromegaly, no lymphadenopathy. Lungs:Clear bilaterally, no wheezes, rhonci, crackles Cardiovascular: Regular rate and rhythm. No murmurs, gallops or rubs. Abdomen:Soft. Bowel sounds present. Non-tender.  Extremities: No lower extremity edema. Pulses are 2 + in the bilateral DP/PT.  Cardiac cath 08/01/13: Left main: No obstructive disease.  Left Anterior Descending Artery: Large caliber vessel that courses to the apex. The proximal and mid vessel is ectatic. There is diffuse 30-40%  stenosis in the mid vessel. There are mild luminal irregularities in the distal vessel.  Circumflex Artery: Large caliber vessel with two obtuse marginal branches. The first OM branch is small in caliber and is chronically occluded in the mid vessel. The second OM branch is patent with patent stent, minimal restenosis. The distal segment of the OM branch has diffuse plaque.  Right Coronary Artery: Large caliber dominant vessel with proximal Shepherd's crook. The proximal vessel has diffuse 40% stenosis. The mid vessel is ectatic with 40% stenosis. The distal vessel has diffuse 50% stenosis. The PDA has 50% proximal stenosis followed by diffuse plaque.  Left Ventricular Angiogram: LVEF=65%.   EKG:  EKG is ordered today. The ekg ordered today demonstrates sinus, rate 56 bpm. 1st degree AV block.   Recent Labs: 07/01/2014: ALT 20 02/20/2015: BUN 12; Creatinine, Ser 1.37*; Hemoglobin 10.9*; Platelets 185; Potassium 4.4; Sodium 141    Wt Readings from Last 3 Encounters:  04/16/15 244 lb 6.4 oz (110.859 kg)  02/26/15 235 lb (106.595 kg)  12/29/14 236 lb (107.049 kg)     Other studies Reviewed: Additional studies/ records that were reviewed today include: . Review of the above records demonstrates:    Assessment and Plan:   1. CAD: Stable. He has chronic chest pain, better over last month. Most recent cath 2015 with stable CAD. The chest pain he is having does not sound cardiac related. Most likely GERD vs musculoskeletal pain. He does not tolerate Imdur secondary to severe headache. Will continue medical management of CAD with ASA, amlodipine, Losartan, statin.   2. HTN: BP controlled today. No changes.   3. Hyperlipidemia: Continue statin. He has been on Pravastatin 40 mg daily and tolerating.  Lipids followed in primary care.   Current medicines are reviewed at length with the patient today.  The patient does not have concerns regarding medicines.  The following changes have been made:   no change  Labs/ tests ordered today include:  No orders of the defined types were placed in this encounter.  Disposition:   FU with me in  6 months  Signed, Lauree Chandler, MD 04/16/2015 2:48 PM    [address], [place], [place]  [address] Phone: [phone]; Fax: [phone]

## 2015-04-16 NOTE — Patient Instructions (Signed)

## 2015-05-01 DIAGNOSIS — M549 Dorsalgia, unspecified: Secondary | ICD-10-CM | POA: Diagnosis not present

## 2015-06-10 DIAGNOSIS — R42 Dizziness and giddiness: Secondary | ICD-10-CM | POA: Diagnosis not present

## 2015-06-12 ENCOUNTER — Emergency Department (HOSPITAL_COMMUNITY)
Admission: EM | Admit: 2015-06-12 | Discharge: 2015-06-12 | Disposition: A | Discharge status: Home / Self Care | Payer: PPO | Attending: Emergency Medicine | Admitting: Emergency Medicine

## 2015-06-12 ENCOUNTER — Emergency Department (HOSPITAL_COMMUNITY): Payer: PPO

## 2015-06-12 ENCOUNTER — Encounter (HOSPITAL_COMMUNITY): Payer: Self-pay | Admitting: Nurse Practitioner

## 2015-06-12 DIAGNOSIS — Z7984 Long term (current) use of oral hypoglycemic drugs: Secondary | ICD-10-CM | POA: Insufficient documentation

## 2015-06-12 DIAGNOSIS — I10 Essential (primary) hypertension: Secondary | ICD-10-CM | POA: Diagnosis not present

## 2015-06-12 DIAGNOSIS — I25119 Atherosclerotic heart disease of native coronary artery with unspecified angina pectoris: Secondary | ICD-10-CM | POA: Insufficient documentation

## 2015-06-12 DIAGNOSIS — Z87828 Personal history of other (healed) physical injury and trauma: Secondary | ICD-10-CM | POA: Diagnosis not present

## 2015-06-12 DIAGNOSIS — R079 Chest pain, unspecified: Secondary | ICD-10-CM | POA: Diagnosis not present

## 2015-06-12 DIAGNOSIS — Z7982 Long term (current) use of aspirin: Secondary | ICD-10-CM | POA: Insufficient documentation

## 2015-06-12 DIAGNOSIS — J449 Chronic obstructive pulmonary disease, unspecified: Secondary | ICD-10-CM | POA: Diagnosis not present

## 2015-06-12 DIAGNOSIS — D509 Iron deficiency anemia, unspecified: Secondary | ICD-10-CM | POA: Diagnosis not present

## 2015-06-12 DIAGNOSIS — Z9889 Other specified postprocedural states: Secondary | ICD-10-CM | POA: Insufficient documentation

## 2015-06-12 DIAGNOSIS — Z8546 Personal history of malignant neoplasm of prostate: Secondary | ICD-10-CM | POA: Insufficient documentation

## 2015-06-12 DIAGNOSIS — R202 Paresthesia of skin: Secondary | ICD-10-CM | POA: Insufficient documentation

## 2015-06-12 DIAGNOSIS — E785 Hyperlipidemia, unspecified: Secondary | ICD-10-CM | POA: Insufficient documentation

## 2015-06-12 DIAGNOSIS — I440 Atrioventricular block, first degree: Secondary | ICD-10-CM | POA: Insufficient documentation

## 2015-06-12 DIAGNOSIS — Z79899 Other long term (current) drug therapy: Secondary | ICD-10-CM | POA: Insufficient documentation

## 2015-06-12 DIAGNOSIS — Z9861 Coronary angioplasty status: Secondary | ICD-10-CM | POA: Diagnosis not present

## 2015-06-12 DIAGNOSIS — K219 Gastro-esophageal reflux disease without esophagitis: Secondary | ICD-10-CM | POA: Insufficient documentation

## 2015-06-12 DIAGNOSIS — Z86718 Personal history of other venous thrombosis and embolism: Secondary | ICD-10-CM | POA: Diagnosis not present

## 2015-06-12 DIAGNOSIS — E119 Type 2 diabetes mellitus without complications: Secondary | ICD-10-CM | POA: Insufficient documentation

## 2015-06-12 DIAGNOSIS — Z87891 Personal history of nicotine dependence: Secondary | ICD-10-CM | POA: Insufficient documentation

## 2015-06-12 DIAGNOSIS — M79602 Pain in left arm: Secondary | ICD-10-CM | POA: Diagnosis not present

## 2015-06-12 LAB — BASIC METABOLIC PANEL
Anion gap: 11 (ref 5–15)
BUN: 11 mg/dL (ref 6–20)
CO2: 26 mmol/L (ref 22–32)
Calcium: 9.8 mg/dL (ref 8.9–10.3)
Chloride: 103 mmol/L (ref 101–111)
Creatinine, Ser: 1.28 mg/dL — ABNORMAL HIGH (ref 0.61–1.24)
GFR calc Af Amer: >60 mL/min (ref >60)
GFR calc non Af Amer: 53 mL/min — ABNORMAL LOW (ref >60)
Glucose, Bld: 85 mg/dL (ref 65–99)
Potassium: 3.7 mmol/L (ref 3.5–5.1)
Sodium: 140 mmol/L (ref 135–145)

## 2015-06-12 LAB — CBC
HCT: 39 % (ref 39.0–52.0)
Hemoglobin: 12 g/dL — ABNORMAL LOW (ref 13.0–17.0)
MCH: 21.1 pg — ABNORMAL LOW (ref 26.0–34.0)
MCHC: 30.8 g/dL (ref 30.0–36.0)
MCV: 68.7 fL — ABNORMAL LOW (ref 78.0–100.0)
Platelets: 187 10*3/uL (ref 150–400)
RBC: 5.68 MIL/uL (ref 4.22–5.81)
RDW: 18.2 % — ABNORMAL HIGH (ref 11.5–15.5)
WBC: 5.4 10*3/uL (ref 4.0–10.5)

## 2015-06-12 LAB — I-STAT TROPONIN, ED
Troponin i, poc: 0.01 ng/mL (ref 0.00–0.08)
Troponin i, poc: 0 ng/mL (ref 0.00–0.08)

## 2015-06-12 LAB — PROTIME-INR
INR: 1.01 (ref 0.00–1.49)
Prothrombin Time: 13.5 seconds (ref 11.6–15.2)

## 2015-06-12 MED ORDER — PANTOPRAZOLE SODIUM 20 MG PO TBEC
20.0000 mg | DELAYED_RELEASE_TABLET | Freq: Once | ORAL | Status: AC
  Administered 2015-06-12: 20 mg via ORAL
  Filled 2015-06-12: qty 1

## 2015-06-12 NOTE — ED Notes (Addendum)
Pt c/o L arm pain and numbness onset 0900 today, then radiating into his chest, and now into his R arm. He denies sob,nasuea, diaphoresis. The pain started while he was resting watching television. The pain has decreased some now, He took a nitro at 1000

## 2015-06-12 NOTE — Discharge Instructions (Signed)
1. Medications: usual home medications 2. Treatment: rest, drink plenty of fluids 3. Follow Up: please followup with your primary doctor for discussion of your diagnoses and further evaluation after today's visit; please return to the ER for new or worsening symptoms   Nonspecific Chest Pain  Chest pain can be caused by many different conditions. There is always a chance that your pain could be related to something serious, such as a heart attack or a blood clot in your lungs. Chest pain can also be caused by conditions that are not life-threatening. If you have chest pain, it is very important to follow up with your health care provider. CAUSES  Chest pain can be caused by:  Heartburn.  Pneumonia or bronchitis.  Anxiety or stress.  Inflammation around your heart (pericarditis) or lung (pleuritis or pleurisy).  A blood clot in your lung.  A collapsed lung (pneumothorax). It can develop suddenly on its own (spontaneous pneumothorax) or from trauma to the chest.  Shingles infection (varicella-zoster virus).  Heart attack.  Damage to the bones, muscles, and cartilage that make up your chest wall. This can include:  Bruised bones due to injury.  Strained muscles or cartilage due to frequent or repeated coughing or overwork.  Fracture to one or more ribs.  Sore cartilage due to inflammation (costochondritis). RISK FACTORS  Risk factors for chest pain may include:  Activities that increase your risk for trauma or injury to your chest.  Respiratory infections or conditions that cause frequent coughing.  Medical conditions or overeating that can cause heartburn.  Heart disease or family history of heart disease.  Conditions or health behaviors that increase your risk of developing a blood clot.  Having had chicken pox (varicella zoster). SIGNS AND SYMPTOMS Chest pain can feel like:  Burning or tingling on the surface of your chest or deep in your chest.  Crushing,  pressure, aching, or squeezing pain.  Dull or sharp pain that is worse when you move, cough, or take a deep breath.  Pain that is also felt in your back, neck, shoulder, or arm, or pain that spreads to any of these areas. Your chest pain may come and go, or it may stay constant. DIAGNOSIS Lab tests or other studies may be needed to find the cause of your pain. Your health care provider may have you take a test called an ambulatory ECG (electrocardiogram). An ECG records your heartbeat patterns at the time the test is performed. You may also have other tests, such as:  Transthoracic echocardiogram (TTE). During echocardiography, sound waves are used to create a picture of all of the heart structures and to look at how blood flows through your heart.  Transesophageal echocardiogram (TEE).This is a more advanced imaging test that obtains images from inside your body. It allows your health care provider to see your heart in finer detail.  Cardiac monitoring. This allows your health care provider to monitor your heart rate and rhythm in real time.  Holter monitor. This is a portable device that records your heartbeat and can help to diagnose abnormal heartbeats. It allows your health care provider to track your heart activity for several days, if needed.  Stress tests. These can be done through exercise or by taking medicine that makes your heart beat more quickly.  Blood tests.  Imaging tests. TREATMENT  Your treatment depends on what is causing your chest pain. Treatment may include:  Medicines. These may include:  Acid blockers for heartburn.  Anti-inflammatory medicine.  Pain medicine for inflammatory conditions.  Antibiotic medicine, if an infection is present.  Medicines to dissolve blood clots.  Medicines to treat coronary artery disease.  Supportive care for conditions that do not require medicines. This may include:  Resting.  Applying heat or cold packs to injured  areas.  Limiting activities until pain decreases. HOME CARE INSTRUCTIONS  If you were prescribed an antibiotic medicine, finish it all even if you start to feel better.  Avoid any activities that bring on chest pain.  Do not use any tobacco products, including cigarettes, chewing tobacco, or electronic cigarettes. If you need help quitting, ask your health care provider.  Do not drink alcohol.  Take medicines only as directed by your health care provider.  Keep all follow-up visits as directed by your health care provider. This is important. This includes any further testing if your chest pain does not go away.  If heartburn is the cause for your chest pain, you may be told to keep your head raised (elevated) while sleeping. This reduces the chance that acid will go from your stomach into your esophagus.  Make lifestyle changes as directed by your health care provider. These may include:  Getting regular exercise. Ask your health care provider to suggest some activities that are safe for you.  Eating a heart-healthy diet. A registered dietitian can help you to learn healthy eating options.  Maintaining a healthy weight.  Managing diabetes, if necessary.  Reducing stress. SEEK MEDICAL CARE IF:  Your chest pain does not go away after treatment.  You have a rash with blisters on your chest.  You have a fever. SEEK IMMEDIATE MEDICAL CARE IF:   Your chest pain is worse.  You have an increasing cough, or you cough up blood.  You have severe abdominal pain.  You have severe weakness.  You faint.  You have chills.  You have sudden, unexplained chest discomfort.  You have sudden, unexplained discomfort in your arms, back, neck, or jaw.  You have shortness of breath at any time.  You suddenly start to sweat, or your skin gets clammy.  You feel nauseous or you vomit.  You suddenly feel light-headed or dizzy.  Your heart begins to beat quickly, or it feels like it  is skipping beats. These symptoms may represent a serious problem that is an emergency. Do not wait to see if the symptoms will go away. Get medical help right away. Call your local emergency services (911 in the [place] Do not drive yourself to the hospital.   This information is not intended to replace advice given to you by your health care provider. Make sure you discuss any questions you have with your health care provider.   Document Released: 01/06/2005 Document Revised: 04/19/2014 Document Reviewed: 11/02/2013 Elsevier Interactive Patient Education [employer].

## 2015-06-12 NOTE — ED Provider Notes (Signed)
CSN: [id number]     Arrival date & time 06/12/15  1122 History   First MD Initiated Contact with Patient 06/12/15 1224     Chief Complaint  Patient presents with  . Chest Pain    HPI   Joel [name] is a 75 y.o. male with a PMH of HTN, HLD, DM, CAD, COPD who presents to the ED with left arm pain, which he states started around 9 AM this morning. He reports his symptoms are intermittent. He denies exacerbating or precipitating factors. He states he was sitting watching TV with the onset of his symptoms. He notes he tried nitroglycerin with no significant symptom relief. He also reports left-sided chest pain. He denies fever, chills, cough, congestion, shortness of breath, abdominal pain, nausea, vomiting, lightheadedness, dizziness. He reports a tingling sensation to his fourth and fifth left fingers. He denies numbness or paresthesia. He states his symptoms are now resolved. Of note, patient also states he ran out of his omeprazole and has had a dry cough, which he attributes to GERD.     Past Medical History  Diagnosis Date  . GERD (gastroesophageal reflux disease)   . HLD (hyperlipidemia)   . HTN (hypertension)   . Cough secondary to angiotensin converting enzyme inhibitor (ACE-I)   . Peripheral vascular disease ([place])   . CAD (coronary artery disease)     A. Negative MV 04/2010;  B. Inadequate Stress Echo 05/2011;  C. 05/29/11 Cath: - Med Rx;  D. 05/2012 Cath/PCI: LM nl, LAD 40p, LCX ectatic, OM1 151m, OM2 90p (3.5x16 Veriflex BMS), RCA dominant 40p/d, PDA 50p, EF 55-65%.  . Iron deficiency anemia   . DVT (deep venous thrombosis) ([place])   . Microcytic anemia   . COPD (chronic obstructive pulmonary disease) ([place])   . Angina   . Shortness of breath     "can come on at anytime" (11/09/2012)  . History of blood transfusion     "I've had 2; don't know what it was related to" (11/09/2012)  . Rotator cuff injury     "left arm; never repaired" (11/09/2012)  . Headache(784.0)     Hx: of  .  Cancer ([place])     Hx: of prostate  . Diabetes mellitus without complication ([place])   . Atypical chest pain    Past Surgical History  Procedure Laterality Date  . Iliac artery aneurysm repair    . Circumcision    . Pr vein bypass graft,aorto-fem-pop Left 10/01/2009    left below knee popliteal artery to posterior tibial artery  . Cataract extraction w/ intraocular lens implant Right ~ 2008  . Coronary angioplasty with stent placement  2014    "1" (11/09/2012)  . Left heart cath  05-31-12  . Cardiovascular stress test  Aug. 2014  . Shoulder arthroscopy with open rotator cuff repair and distal clavicle acrominectomy Left 01/12/2013    Procedure: LEFT SHOULDER ARTHROSCOPY WITH DEBRIDEMENT OPEN DISTAL CLAVICLE RESECTION ,acromioplastyAND ROTATOR CUFF REPAIR;  Surgeon: Yvette Rack., MD;  Location: [place];  Service: Orthopedics;  Laterality: Left;  . Left heart catheterization with coronary angiogram N/A 06/01/2011    Procedure: LEFT HEART CATHETERIZATION WITH CORONARY ANGIOGRAM;  Surgeon: Hillary Bow, MD;  Location: [facility] CATH LAB;  Service: Cardiovascular;  Laterality: N/A;  . Left heart catheterization with coronary angiogram N/A 05/31/2012    Procedure: LEFT HEART CATHETERIZATION WITH CORONARY ANGIOGRAM;  Surgeon: Peter M Martinique, MD;  Location: [facility] CATH LAB;  Service: Cardiovascular;  Laterality: N/A;  .  Percutaneous coronary stent intervention (pci-s)  05/31/2012    Procedure: PERCUTANEOUS CORONARY STENT INTERVENTION (PCI-S);  Surgeon: Peter M Martinique, MD;  Location: [facility] CATH LAB;  Service: Cardiovascular;;  . Left heart catheterization with coronary angiogram N/A 08/01/2013    Procedure: LEFT HEART CATHETERIZATION WITH CORONARY ANGIOGRAM;  Surgeon: Burnell Blanks, MD;  Location: [facility] CATH LAB;  Service: Cardiovascular;  Laterality: N/A;   Family History  Problem Relation Age of Onset  . Breast cancer Mother     deceased  . Cancer Mother   . Hyperlipidemia Mother   . Hypertension Mother    . COPD Father     deceased  . Cancer Father   . Hyperlipidemia Father   . Hypertension Father   . Hypertension Sister   . Cancer Brother   . Hypertension Brother   . Hypertension Daughter   . Hypertension Son    Social History  Substance Use Topics  . Smoking status: Former Smoker -- 1.00 packs/day for 55 years    Types: Cigarettes    Quit date: 05/26/2011  . Smokeless tobacco: Never Used  . Alcohol Use: No      Review of Systems  Constitutional: Negative for fever and chills.  HENT: Negative for congestion.   Respiratory: Negative for cough and shortness of breath.   Cardiovascular: Positive for chest pain.  Gastrointestinal: Negative for nausea, vomiting and abdominal pain.  Musculoskeletal: Positive for myalgias and arthralgias.  Neurological: Negative for weakness and numbness.  All other systems reviewed and are negative.     Allergies  Atenolol; Imdur; Lisinopril; Topiramate; and Tramadol-acetaminophen  Home Medications   Prior to Admission medications   Medication Sig Start Date End Date Taking? Authorizing Provider  albuterol (PROVENTIL HFA;VENTOLIN HFA) 108 (90 BASE) MCG/ACT inhaler Inhale 2 puffs into the lungs every 6 (six) hours as needed for wheezing or shortness of breath.    Historical Provider, MD  amLODipine (NORVASC) 10 MG tablet Take 1 tablet (10 mg total) by mouth daily. 05/04/14   Wandra Arthurs, MD  aspirin EC 81 MG tablet Take 81 mg by mouth daily.     Historical Provider, MD  ferrous sulfate 325 (65 FE) MG tablet Take 325 mg by mouth daily with breakfast.    Historical Provider, MD  losartan-hydrochlorothiazide (HYZAAR) 100-25 MG per tablet Take 1 tablet by mouth daily. 09/18/14   Burnell Blanks, MD  metFORMIN (GLUCOPHAGE) 500 MG tablet Take 500 mg by mouth 2 (two) times daily. 06/13/14   Historical Provider, MD  methocarbamol (ROBAXIN) 750 MG tablet Take 750 mg by mouth every 8 (eight) hours. 02/13/15   Historical Provider, MD  nitroGLYCERIN  (NITROSTAT) 0.4 MG SL tablet Place 1 tablet (0.4 mg total) under the tongue every 5 (five) minutes as needed for chest pain. 04/16/15   Burnell Blanks, MD  omeprazole (PRILOSEC) 20 MG capsule Take 20 mg by mouth 2 (two) times daily before a meal.    Historical Provider, MD  potassium chloride (K-DUR,KLOR-CON) 10 MEQ tablet take 1 tablet by mouth once daily 08/02/13   Burnell Blanks, MD  pravastatin (PRAVACHOL) 40 MG tablet Take 1 tablet (40 mg total) by mouth daily. 04/16/15   Burnell Blanks, MD  traMADol (ULTRAM) 50 MG tablet Take 50 mg by mouth every 6 (six) hours as needed for moderate pain.    Historical Provider, MD    BP 130/68 mmHg  Pulse 63  Temp(Src) 97.8 F (36.6 C) (Oral)  Resp 16  Ht  5\' 10"  (1.778 m)  Wt 108.41 kg  BMI 34.29 kg/m2  SpO2 98% Physical Exam  Constitutional: He is oriented to person, place, and time. He appears well-developed and well-nourished. No distress.  HENT:  Head: Normocephalic and atraumatic.  Right Ear: External ear normal.  Left Ear: External ear normal.  Nose: Nose normal.  Mouth/Throat: Uvula is midline, oropharynx is clear and moist and mucous membranes are normal.  Eyes: Conjunctivae, EOM and lids are normal. Pupils are equal, round, and reactive to light. Right eye exhibits no discharge. Left eye exhibits no discharge. No scleral icterus.  Neck: Normal range of motion. Neck supple.  Cardiovascular: Normal rate, regular rhythm, normal heart sounds, intact distal pulses and normal pulses.   Pulmonary/Chest: Effort normal and breath sounds normal. No respiratory distress. He has no wheezes. He has no rales.  Abdominal: Soft. Normal appearance and bowel sounds are normal. He exhibits no distension and no mass. There is no tenderness. There is no rigidity, no rebound and no guarding.  Musculoskeletal: Normal range of motion. He exhibits no edema or tenderness.  No TTP to left upper extremity. No erythema, edema, or heat of left  upper extremity. Full range of motion.  Neurological: He is alert and oriented to person, place, and time. He has normal strength. No sensory deficit.  Skin: Skin is warm, dry and intact. No rash noted. He is not diaphoretic. No erythema. No pallor.  Psychiatric: He has a normal mood and affect. His speech is normal and behavior is normal.  Nursing note and vitals reviewed.   ED Course  Procedures (including critical care time)  Labs Review Labs Reviewed  BASIC METABOLIC PANEL - Abnormal; Notable for the following:    Creatinine, Ser 1.28 (*)    GFR calc non Af Amer 53 (*)    All other components within normal limits  CBC - Abnormal; Notable for the following:    Hemoglobin 12.0 (*)    MCV 68.7 (*)    MCH 21.1 (*)    RDW 18.2 (*)    All other components within normal limits  PROTIME-INR  I-STAT TROPOININ, ED  I-STAT TROPOININ, ED    Imaging Review Dg Chest 2 View  06/12/2015  CLINICAL DATA:  LEFT arm pain today, history hypertension, hyperlipidemia, GERD, coronary artery disease, COPD, diabetes mellitus EXAM: CHEST  2 VIEW COMPARISON:  02/20/2015 FINDINGS: Normal heart size, mediastinal contours, and pulmonary vascularity. Atherosclerotic calcification at aortic arch. Central peribronchial thickening, chronic. No acute infiltrate, pleural effusion or pneumothorax. Eventration RIGHT diaphragm stable. Scattered degenerative disc disease changes thoracic spine. IMPRESSION: Chronic bronchitic changes without infiltrate. Electronically Signed   By: Lavonia Dana M.D.   On: 06/12/2015 12:22   I have personally reviewed and evaluated these images and lab results as part of my medical decision-making.   EKG Interpretation   Date/Time:  Thursday June 12 2015 11:31:34 EST Ventricular Rate:  67 PR Interval:  220 QRS Duration: 92 QT Interval:  388 QTC Calculation: 409 R Axis:   46 Text Interpretation:  Sinus rhythm with 1st degree A-V block Otherwise  normal ECG No significant change  since last tracing Confirmed by YAO  MD,  DAVID ([id number]) on 06/12/2015 12:16:25 PM      MDM   Final diagnoses:  Chest pain, unspecified chest pain type  Left arm pain    75 year old male presents with left arm pain and left-sided chest pain. Notes his symptoms are now resolved. Denies fever, chills, cough, congestion, shortness of  breath. Patient is afebrile. Vital signs stable. Heart regular rate and rhythm. Lungs clear to auscultation bilaterally. Abdomen soft, nontender, nondistended. Strength and sensation intact. Per record review, patient has been evaluated in the ED for similar symptoms. He was seen by Cardiology in January and noted to have chronic chest pain thought to be GERD vs MSK. Most recent cath was in 2015 and revealed stable CAD.  EKG sinus rhythm with first-degree AV block, heart rate 67. Troponin negative. Chest x-ray remarkable for chronic bronchitic changes without infiltrate. CBC negative for leukocytosis, hemoglobin 12. BMP remarkable for creatinine 1.28. Troponin negative.  Patient requesting dose of home PPI in the ED. Will give protonix. Patient discussed with and seen by Dr. Darl Householder.  Will obtain repeat troponin.  Repeat troponin negative. Patient is non-toxic and well-appearing, feel he is stable for discharge at this time. Patient to follow-up with PCP. Return precautions discussed. Patient verbalizes his understanding and is in agreement with plan.  BP 130/68 mmHg  Pulse 63  Temp(Src) 97.8 F (36.6 C) (Oral)  Resp 16  Ht 5\' 10"  (1.778 m)  Wt 108.41 kg  BMI 34.29 kg/m2  SpO2 98%    Marella Chimes, PA-C 06/12/15 1544  Wandra Arthurs, MD 06/13/15 1505

## 2015-07-31 DIAGNOSIS — R51 Headache: Secondary | ICD-10-CM | POA: Diagnosis not present

## 2015-08-06 ENCOUNTER — Emergency Department (HOSPITAL_COMMUNITY): Payer: PPO

## 2015-08-06 ENCOUNTER — Encounter (HOSPITAL_COMMUNITY): Payer: Self-pay | Admitting: Emergency Medicine

## 2015-08-06 ENCOUNTER — Emergency Department (HOSPITAL_COMMUNITY)
Admission: EM | Admit: 2015-08-06 | Discharge: 2015-08-06 | Disposition: A | Discharge status: Home / Self Care | Payer: PPO | Attending: Emergency Medicine | Admitting: Emergency Medicine

## 2015-08-06 DIAGNOSIS — I25119 Atherosclerotic heart disease of native coronary artery with unspecified angina pectoris: Secondary | ICD-10-CM | POA: Insufficient documentation

## 2015-08-06 DIAGNOSIS — D509 Iron deficiency anemia, unspecified: Secondary | ICD-10-CM | POA: Insufficient documentation

## 2015-08-06 DIAGNOSIS — Z79899 Other long term (current) drug therapy: Secondary | ICD-10-CM | POA: Insufficient documentation

## 2015-08-06 DIAGNOSIS — E785 Hyperlipidemia, unspecified: Secondary | ICD-10-CM | POA: Insufficient documentation

## 2015-08-06 DIAGNOSIS — R079 Chest pain, unspecified: Secondary | ICD-10-CM | POA: Diagnosis not present

## 2015-08-06 DIAGNOSIS — R0989 Other specified symptoms and signs involving the circulatory and respiratory systems: Secondary | ICD-10-CM | POA: Diagnosis not present

## 2015-08-06 DIAGNOSIS — K219 Gastro-esophageal reflux disease without esophagitis: Secondary | ICD-10-CM | POA: Diagnosis not present

## 2015-08-06 DIAGNOSIS — I10 Essential (primary) hypertension: Secondary | ICD-10-CM | POA: Insufficient documentation

## 2015-08-06 DIAGNOSIS — E119 Type 2 diabetes mellitus without complications: Secondary | ICD-10-CM | POA: Diagnosis not present

## 2015-08-06 DIAGNOSIS — J449 Chronic obstructive pulmonary disease, unspecified: Secondary | ICD-10-CM | POA: Insufficient documentation

## 2015-08-06 DIAGNOSIS — Z86718 Personal history of other venous thrombosis and embolism: Secondary | ICD-10-CM | POA: Insufficient documentation

## 2015-08-06 DIAGNOSIS — Z7982 Long term (current) use of aspirin: Secondary | ICD-10-CM | POA: Insufficient documentation

## 2015-08-06 DIAGNOSIS — M79602 Pain in left arm: Secondary | ICD-10-CM | POA: Diagnosis not present

## 2015-08-06 DIAGNOSIS — Z9861 Coronary angioplasty status: Secondary | ICD-10-CM | POA: Diagnosis not present

## 2015-08-06 DIAGNOSIS — Z8546 Personal history of malignant neoplasm of prostate: Secondary | ICD-10-CM | POA: Diagnosis not present

## 2015-08-06 DIAGNOSIS — Z9889 Other specified postprocedural states: Secondary | ICD-10-CM | POA: Insufficient documentation

## 2015-08-06 DIAGNOSIS — R0789 Other chest pain: Secondary | ICD-10-CM | POA: Diagnosis not present

## 2015-08-06 DIAGNOSIS — Z87891 Personal history of nicotine dependence: Secondary | ICD-10-CM | POA: Diagnosis not present

## 2015-08-06 LAB — I-STAT TROPONIN, ED: Troponin i, poc: 0 ng/mL (ref 0.00–0.08)

## 2015-08-06 LAB — CBC
HCT: 35.6 % — ABNORMAL LOW (ref 39.0–52.0)
Hemoglobin: 10.9 g/dL — ABNORMAL LOW (ref 13.0–17.0)
MCH: 21.5 pg — ABNORMAL LOW (ref 26.0–34.0)
MCHC: 30.6 g/dL (ref 30.0–36.0)
MCV: 70.2 fL — ABNORMAL LOW (ref 78.0–100.0)
Platelets: 147 10*3/uL — ABNORMAL LOW (ref 150–400)
RBC: 5.07 MIL/uL (ref 4.22–5.81)
RDW: 17.7 % — ABNORMAL HIGH (ref 11.5–15.5)
WBC: 6 10*3/uL (ref 4.0–10.5)

## 2015-08-06 LAB — DIFFERENTIAL
Basophils Absolute: 0 10*3/uL (ref 0.0–0.1)
Basophils Relative: 1 %
Eosinophils Absolute: 0.3 10*3/uL (ref 0.0–0.7)
Eosinophils Relative: 4 %
Lymphocytes Relative: 27 %
Lymphs Abs: 1.7 10*3/uL (ref 0.7–4.0)
Monocytes Absolute: 0.4 10*3/uL (ref 0.1–1.0)
Monocytes Relative: 7 %
Neutro Abs: 3.8 10*3/uL (ref 1.7–7.7)
Neutrophils Relative %: 61 %

## 2015-08-06 LAB — BASIC METABOLIC PANEL
Anion gap: 8 (ref 5–15)
BUN: 12 mg/dL (ref 6–20)
CO2: 29 mmol/L (ref 22–32)
Calcium: 9.4 mg/dL (ref 8.9–10.3)
Chloride: 102 mmol/L (ref 101–111)
Creatinine, Ser: 1.29 mg/dL — ABNORMAL HIGH (ref 0.61–1.24)
GFR calc Af Amer: >60 mL/min (ref >60)
GFR calc non Af Amer: 53 mL/min — ABNORMAL LOW (ref >60)
Glucose, Bld: 149 mg/dL — ABNORMAL HIGH (ref 65–99)
Potassium: 4 mmol/L (ref 3.5–5.1)
Sodium: 139 mmol/L (ref 135–145)

## 2015-08-06 LAB — TROPONIN I: Troponin I: <0.03 ng/mL (ref <0.031)

## 2015-08-06 MED ORDER — ACETAMINOPHEN 325 MG PO TABS
650.0000 mg | ORAL_TABLET | Freq: Once | ORAL | Status: AC
  Administered 2015-08-06: 650 mg via ORAL
  Filled 2015-08-06: qty 2

## 2015-08-06 MED ORDER — ASPIRIN 81 MG PO CHEW
243.0000 mg | CHEWABLE_TABLET | Freq: Once | ORAL | Status: AC
  Administered 2015-08-06: 243 mg via ORAL
  Filled 2015-08-06: qty 3

## 2015-08-06 NOTE — Consult Note (Signed)
CARDIOLOGY CONSULT   Patient ID: CHA PRESS MRN: [id number] DOB/AGE: 1940/10/10 75 y.o.  Admit date: 08/06/2015  Primary Physician   [name], MD Primary Cardiologist: Dr. Angelena Form Reason for Consultation: Chest pain  HPI: Ms. Stanbery is a 75 year old male with a past medical history of HTN, HLD, CAD, COPD, and DM.  He was sitting watching tv this morning and began to have left arm pain that radiated to his left axilla. He says he had some intermittent left sided chest pain but mostly what bothered him was his arm.  Arm was not numb or tingly. He denies any associated SOB, nausea or diaphoresis. He took 2 SL Nitro without relief. This episode lasted approximately one hour.He does note that he had a great amount of pain and gas with his pain. He then drove to the ED, he has had neck pain since arrival to the ED.   His last cath was in April 2015 (report below), he had patent stent to second OM branch, moderate disease in LAD and RCA.  Medical management was reccommended. Last Echo was in 2011, EF was 60-65%.   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 Denies any shortness of breath with activity. His troponin has been negative 1.  EKG shows normal sinus rhythm, not concerning for ischemia.   Past Medical History  Diagnosis Date  . GERD (gastroesophageal reflux disease)   . HLD (hyperlipidemia)   . HTN (hypertension)   . Cough secondary to angiotensin converting enzyme inhibitor (ACE-I)   . Peripheral vascular disease ([place])   . CAD (coronary artery disease)     A. Negative MV 04/2010;  B. Inadequate Stress Echo 05/2011;  C. 05/29/11 Cath: - Med Rx;  D. 05/2012 Cath/PCI: LM nl, LAD 40p, LCX ectatic, OM1 163m, OM2 90p (3.5x16 Veriflex BMS), RCA dominant 40p/d, PDA 50p, EF 55-65%.  . Iron deficiency anemia   . DVT (deep venous thrombosis) ([place])   . Microcytic anemia   . COPD (chronic obstructive pulmonary disease) ([place])   . Angina   . Shortness of breath    "can come on at anytime" (11/09/2012)  . History of blood transfusion     "I've had 2; don't know what it was related to" (11/09/2012)  . Rotator cuff injury     "left arm; never repaired" (11/09/2012)  . Headache(784.0)     Hx: of  . Cancer ([place])     Hx: of prostate  . Diabetes mellitus without complication ([place])   . Atypical chest pain      Past Surgical History  Procedure Laterality Date  . Iliac artery aneurysm repair    . Circumcision    . Pr vein bypass graft,aorto-fem-pop Left 10/01/2009    left below knee popliteal artery to posterior tibial artery  . Cataract extraction w/ intraocular lens implant Right ~ 2008  . Coronary angioplasty with stent placement  2014    "1" (11/09/2012)  . Left heart cath  05-31-12  . Cardiovascular stress test  Aug. 2014  . Shoulder arthroscopy with open rotator cuff repair and distal clavicle acrominectomy Left 01/12/2013    Procedure: LEFT SHOULDER ARTHROSCOPY WITH DEBRIDEMENT OPEN DISTAL CLAVICLE RESECTION ,acromioplastyAND ROTATOR CUFF REPAIR;  Surgeon: Yvette Rack., MD;  Location: [facility];  Service: Orthopedics;  Laterality: Left;  . Left heart catheterization with coronary angiogram N/A 06/01/2011    Procedure: LEFT HEART CATHETERIZATION WITH CORONARY ANGIOGRAM;  Surgeon: Hillary Bow, MD;  Location: [facility] CATH LAB;  Service: Cardiovascular;  Laterality: N/A;  . Left heart catheterization with coronary angiogram N/A 05/31/2012    Procedure: LEFT HEART CATHETERIZATION WITH CORONARY ANGIOGRAM;  Surgeon: Peter M Martinique, MD;  Location: [facility] CATH LAB;  Service: Cardiovascular;  Laterality: N/A;  . Percutaneous coronary stent intervention (pci-s)  05/31/2012    Procedure: PERCUTANEOUS CORONARY STENT INTERVENTION (PCI-S);  Surgeon: Peter M Martinique, MD;  Location: [facility] CATH LAB;  Service: Cardiovascular;;  . Left heart catheterization with coronary angiogram N/A 08/01/2013    Procedure: LEFT HEART CATHETERIZATION WITH CORONARY ANGIOGRAM;  Surgeon: Burnell Blanks, MD;  Location: [facility] CATH LAB;  Service: Cardiovascular;  Laterality: N/A;    Allergies  Allergen Reactions  . Atenolol     bradycardia  . Imdur [Isosorbide Mononitrate] Nausea Only and Other (See Comments)    Headache  . Lisinopril Other (See Comments)    Cough  . Topiramate     Shortness of breath and leg cramps  . Tramadol-Acetaminophen Other (See Comments)    intolerance    I have reviewed the patient's current medications     Prior to Admission medications   Medication Sig Start Date End Date Taking? Authorizing Provider  albuterol (PROVENTIL HFA;VENTOLIN HFA) 108 (90 BASE) MCG/ACT inhaler Inhale 2 puffs into the lungs every 6 (six) hours as needed for wheezing or shortness of breath.   Yes Historical Provider, MD  amLODipine (NORVASC) 10 MG tablet Take 1 tablet (10 mg total) by mouth daily. 05/04/14  Yes Wandra Arthurs, MD  aspirin EC 81 MG tablet Take 81 mg by mouth daily.    Yes Historical Provider, MD  ferrous sulfate 325 (65 FE) MG tablet Take 325 mg by mouth daily with breakfast.   Yes Historical Provider, MD  fexofenadine (ALLEGRA) 180 MG tablet Take 180 mg by mouth daily.   Yes Historical Provider, MD  losartan-hydrochlorothiazide (HYZAAR) 100-25 MG per tablet Take 1 tablet by mouth daily. 09/18/14  Yes Burnell Blanks, MD  nitroGLYCERIN (NITROSTAT) 0.4 MG SL tablet Place 1 tablet (0.4 mg total) under the tongue every 5 (five) minutes as needed for chest pain. 04/16/15  Yes Burnell Blanks, MD  omeprazole (PRILOSEC) 20 MG capsule Take 20 mg by mouth 2 (two) times daily before a meal.   Yes Historical Provider, MD  potassium chloride (K-DUR,KLOR-CON) 10 MEQ tablet take 1 tablet by mouth once daily 08/02/13  Yes Burnell Blanks, MD  pravastatin (PRAVACHOL) 40 MG tablet Take 1 tablet (40 mg total) by mouth daily. 04/16/15  Yes Burnell Blanks, MD  traMADol (ULTRAM) 50 MG tablet Take 50 mg by mouth every 6 (six) hours as needed for moderate pain.    Yes Historical Provider, MD     Social History   Social History  . Marital Status: Widowed    Spouse Name: N/A  . Number of Children: N/A  . Years of Education: N/A   Occupational History  . Retired    Social History Main Topics  . Smoking status: Former Smoker -- 1.00 packs/day for 55 years    Types: Cigarettes    Quit date: 05/26/2011  . Smokeless tobacco: Never Used  . Alcohol Use: No  . Drug Use: No  . Sexual Activity: Not on file     Comment: 11/09/2012 "I don't have to answer questions about sex"   Other Topics Concern  . Not on file   Social History Narrative   Single. Retired. Still works on cars  part-time.  Lives in [place] by himself.    Family Status  Relation Status Death Age  . Mother Deceased 82  . Father Deceased 65    smoker  . Sister Alive     good health  . Brother Deceased 21 month  . Brother Deceased 69  . Brother Alive     good health  . Brother Alive     good health  . Brother Alive     good health  . Sister Alive     good health  . Sister Alive     good health   Family History  Problem Relation Age of Onset  . Breast cancer Mother     deceased  . Cancer Mother   . Hyperlipidemia Mother   . Hypertension Mother   . COPD Father     deceased  . Cancer Father   . Hyperlipidemia Father   . Hypertension Father   . Hypertension Sister   . Cancer Brother   . Hypertension Brother   . Hypertension Daughter   . Hypertension Son      ROS:  Full 14 point review of systems complete and found to be negative unless listed above.  Physical Exam: Blood pressure 131/67, pulse 52, temperature 97.3 F (36.3 C), temperature source Oral, resp. rate 12, SpO2 98 %.  General: Well developed, well nourished, male in no acute distress Head: Eyes PERRLA, No xanthomas.   Normocephalic and atraumatic, oropharynx without edema or exudate. Dentition:  Good  Lungs: CTA Heart: HRRR S1 S2, no rub/gallop, No murmur. Pulses are 2+ extrem.   Neck: No carotid  bruits. No lymphadenopathy. No JVD. Abdomen: Bowel sounds present, abdomen soft and non-tender without masses or hernias noted. Msk:  No spine or cva tenderness. No weakness, no joint deformities or effusions. Extremities: No clubbing or cyanosis. No edema.  Neuro: Alert and oriented X 3. No focal deficits noted. Psych:  Good affect, responds appropriately Skin: No rashes or lesions noted.  Labs:   Lab Results  Component Value Date   WBC 6.0 08/06/2015   HGB 10.9* 08/06/2015   HCT 35.6* 08/06/2015   MCV 70.2* 08/06/2015   PLT 147* 08/06/2015    Recent Labs Lab 08/06/15 0907  NA 139  K 4.0  CL 102  CO2 29  BUN 12  CREATININE 1.29*  CALCIUM 9.4  GLUCOSE 149*    Recent Labs  08/06/15 0918  TROPIPOC 0.00    ECG: NSR with 1 degree AVB  Cath 07/2013 Angiographic Findings:  Left main: No obstructive disease.   Left Anterior Descending Artery: Large caliber vessel that courses to the apex. The proximal and mid vessel is ectatic. There is diffuse 30-40% stenosis in the mid vessel. There are mild luminal irregularities in the distal vessel.   Circumflex Artery: Large caliber vessel with two obtuse marginal branches. The first OM branch is small in caliber and is chronically occluded in the mid vessel. The second OM branch is patent with patent stent, minimal restenosis. The distal segment of the OM branch has diffuse plaque.   Right Coronary Artery: Large caliber dominant vessel with proximal Shepherd's crook. The proximal vessel has diffuse 40% stenosis. The mid vessel is ectatic with 40% stenosis. The distal vessel has diffuse 50% stenosis. The PDA has 50% proximal stenosis followed by diffuse plaque.   Left Ventricular Angiogram: LVEF=65%.   Impression: 1. Triple vessel CAD with patent stent in the second OM branch.  2. Moderate non-obstructive disease in the  LAD and RCA 3. Normal LV function  Recommendations: Continue medical management of CAD.            Radiology:  Dg Chest 2 View  08/06/2015  CLINICAL DATA:  Pt c/o left arm and left-sided chest pain and congestion that began this morning. Pt took 2 tablets of nitroglycerin but it did not help. Hx of HTN, CAD, angina, and DM. Pt is a former smoker. EXAM: CHEST - 2 VIEW COMPARISON:  06/12/2015 FINDINGS: Coarse perihilar interstitial markings as before. No new airspace infiltrate or overt edema. Heart size normal. Atheromatous aorta. No effusion.  No pneumothorax. Anterior vertebral endplate spurring at multiple levels in the mid and lower thoracic spine. IMPRESSION: Chronic perihilar interstitial changes.  No acute disease. Electronically Signed   By: Lucrezia Europe M.D.   On: 08/06/2015 09:23    ASSESSMENT AND PLAN:    Active Problems:   Chest pain, atypical  1. Atypical chest pain: Patient presented with left arm pain that began at rest. Pain lasted for about one hour and intermittently radiated to his left chest, but the pain was mostly centralized at his arm. He noted a great deal of belching and gas after the pain subsided. His troponin has been negative x1.  EKG not concerning for ischemia.  There is <1 mm elevation in leads V5 and V6, that is previously noted on EKG from 2016.   He states that his usual anginal pain is located in the center of his chest.  This pain today was different.  He has previously been intolerant of Imdur.  He is not on beta blocker as his HR is usually in the mid 50's. No ischemic evaluation is needed at this time.  He is pain free currently, I suspect given that his symptoms were associated with belching and gas that this is not concerning for cardiac origin.   Signed: Arbutus Leas, NP 08/06/2015 1:54 PM Pager [phone]  I have personally seen and examined this patient with Jettie Booze, NP. I agree with the assessment and plan as outlined above. He has had left arm pain but no chest pain. This has resolved. Not likely cardiac related. EKG unchanged.  Troponin negative. Exam is normal. I think he can go home today. He will call with changes.   Fran Neiswonger 08/06/2015 2:44 PM

## 2015-08-06 NOTE — ED Provider Notes (Signed)
CSN: [id number]     Arrival date & time 08/06/15  0902 History   First MD Initiated Contact with Patient 08/06/15 1047     Chief Complaint  Patient presents with  . Arm Pain  . Chest Pain     (Consider location/radiation/quality/duration/timing/severity/associated sxs/prior Treatment) HPI   Joel [name] is a 75 y.o. male, with a history of CAD, hypertension, DVT, DM, anemia, and prostate cancer, presenting to the ED with left arm pain and chest pain that began around 8:00 am this morning. Pt states he was sitting on the sofa when he began to have pain in his left arm. Shortly afterward, pt began to have left sided chest pain. Pt took two NTG with little relief. Pt rates the pain at 10/10, constant, with a vague description. Pt states, "I don't know how to describe it. It just hurt." Pt currently rates his pain at 3/10. Pt denies shortness of breath, N/V, cough, fever/chills, or any other complaints. Pt states he took all his medications this morning, including his 81 mg ASA.     Past Medical History  Diagnosis Date  . GERD (gastroesophageal reflux disease)   . HLD (hyperlipidemia)   . HTN (hypertension)   . Cough secondary to angiotensin converting enzyme inhibitor (ACE-I)   . Peripheral vascular disease ([place])   . CAD (coronary artery disease)     A. Negative MV 04/2010;  B. Inadequate Stress Echo 05/2011;  C. 05/29/11 Cath: - Med Rx;  D. 05/2012 Cath/PCI: LM nl, LAD 40p, LCX ectatic, OM1 158m, OM2 90p (3.5x16 Veriflex BMS), RCA dominant 40p/d, PDA 50p, EF 55-65%.  . Iron deficiency anemia   . DVT (deep venous thrombosis) ([place])   . Microcytic anemia   . COPD (chronic obstructive pulmonary disease) ([place])   . Angina   . Shortness of breath     "can come on at anytime" (11/09/2012)  . History of blood transfusion     "I've had 2; don't know what it was related to" (11/09/2012)  . Rotator cuff injury     "left arm; never repaired" (11/09/2012)  . Headache(784.0)     Hx: of  . Cancer  ([place])     Hx: of prostate  . Diabetes mellitus without complication ([place])   . Atypical chest pain    Past Surgical History  Procedure Laterality Date  . Iliac artery aneurysm repair    . Circumcision    . Pr vein bypass graft,aorto-fem-pop Left 10/01/2009    left below knee popliteal artery to posterior tibial artery  . Cataract extraction w/ intraocular lens implant Right ~ 2008  . Coronary angioplasty with stent placement  2014    "1" (11/09/2012)  . Left heart cath  05-31-12  . Cardiovascular stress test  Aug. 2014  . Shoulder arthroscopy with open rotator cuff repair and distal clavicle acrominectomy Left 01/12/2013    Procedure: LEFT SHOULDER ARTHROSCOPY WITH DEBRIDEMENT OPEN DISTAL CLAVICLE RESECTION ,acromioplastyAND ROTATOR CUFF REPAIR;  Surgeon: Yvette Rack., MD;  Location: [facility];  Service: Orthopedics;  Laterality: Left;  . Left heart catheterization with coronary angiogram N/A 06/01/2011    Procedure: LEFT HEART CATHETERIZATION WITH CORONARY ANGIOGRAM;  Surgeon: Hillary Bow, MD;  Location: [facility] CATH LAB;  Service: Cardiovascular;  Laterality: N/A;  . Left heart catheterization with coronary angiogram N/A 05/31/2012    Procedure: LEFT HEART CATHETERIZATION WITH CORONARY ANGIOGRAM;  Surgeon: Peter M Martinique, MD;  Location: [facility] CATH LAB;  Service: Cardiovascular;  Laterality: N/A;  .  Percutaneous coronary stent intervention (pci-s)  05/31/2012    Procedure: PERCUTANEOUS CORONARY STENT INTERVENTION (PCI-S);  Surgeon: Peter M Martinique, MD;  Location: [facility] CATH LAB;  Service: Cardiovascular;;  . Left heart catheterization with coronary angiogram N/A 08/01/2013    Procedure: LEFT HEART CATHETERIZATION WITH CORONARY ANGIOGRAM;  Surgeon: Burnell Blanks, MD;  Location: [facility] CATH LAB;  Service: Cardiovascular;  Laterality: N/A;   Family History  Problem Relation Age of Onset  . Breast cancer Mother     deceased  . Cancer Mother   . Hyperlipidemia Mother   . Hypertension Mother   .  COPD Father     deceased  . Cancer Father   . Hyperlipidemia Father   . Hypertension Father   . Hypertension Sister   . Cancer Brother   . Hypertension Brother   . Hypertension Daughter   . Hypertension Son    Social History  Substance Use Topics  . Smoking status: Former Smoker -- 1.00 packs/day for 55 years    Types: Cigarettes    Quit date: 05/26/2011  . Smokeless tobacco: Never Used  . Alcohol Use: No    Review of Systems  Constitutional: Negative for fever, chills and diaphoresis.  Respiratory: Negative for cough and shortness of breath.   Cardiovascular: Positive for chest pain.  Gastrointestinal: Negative for nausea and vomiting.  Neurological: Negative for dizziness, syncope, weakness, light-headedness and headaches.  All other systems reviewed and are negative.     Allergies  Atenolol; Imdur; Lisinopril; Topiramate; and Tramadol-acetaminophen  Home Medications   Prior to Admission medications   Medication Sig Start Date End Date Taking? Authorizing Provider  albuterol (PROVENTIL HFA;VENTOLIN HFA) 108 (90 BASE) MCG/ACT inhaler Inhale 2 puffs into the lungs every 6 (six) hours as needed for wheezing or shortness of breath.   Yes Historical Provider, MD  amLODipine (NORVASC) 10 MG tablet Take 1 tablet (10 mg total) by mouth daily. 05/04/14  Yes Wandra Arthurs, MD  aspirin EC 81 MG tablet Take 81 mg by mouth daily.    Yes Historical Provider, MD  ferrous sulfate 325 (65 FE) MG tablet Take 325 mg by mouth daily with breakfast.   Yes Historical Provider, MD  fexofenadine (ALLEGRA) 180 MG tablet Take 180 mg by mouth daily.   Yes Historical Provider, MD  losartan-hydrochlorothiazide (HYZAAR) 100-25 MG per tablet Take 1 tablet by mouth daily. 09/18/14  Yes Burnell Blanks, MD  nitroGLYCERIN (NITROSTAT) 0.4 MG SL tablet Place 1 tablet (0.4 mg total) under the tongue every 5 (five) minutes as needed for chest pain. 04/16/15  Yes Burnell Blanks, MD  omeprazole  (PRILOSEC) 20 MG capsule Take 20 mg by mouth 2 (two) times daily before a meal.   Yes Historical Provider, MD  potassium chloride (K-DUR,KLOR-CON) 10 MEQ tablet take 1 tablet by mouth once daily 08/02/13  Yes Burnell Blanks, MD  pravastatin (PRAVACHOL) 40 MG tablet Take 1 tablet (40 mg total) by mouth daily. 04/16/15  Yes Burnell Blanks, MD  traMADol (ULTRAM) 50 MG tablet Take 50 mg by mouth every 6 (six) hours as needed for moderate pain.   Yes Historical Provider, MD   BP 131/67 mmHg  Pulse 52  Temp(Src) 97.3 F (36.3 C) (Oral)  Resp 12  SpO2 98% Physical Exam  Constitutional: He appears well-developed and well-nourished. No distress.  HENT:  Head: Normocephalic and atraumatic.  Eyes: Conjunctivae are normal. Pupils are equal, round, and reactive to light.  Neck: Neck supple.  Cardiovascular:  Normal rate, regular rhythm, normal heart sounds and intact distal pulses.   Pulmonary/Chest: Effort normal and breath sounds normal. No respiratory distress. He exhibits no tenderness.  Abdominal: Soft. There is no tenderness. There is no guarding.  Musculoskeletal: He exhibits no edema or tenderness.  Lymphadenopathy:    He has no cervical adenopathy.  Neurological: He is alert.  Skin: Skin is warm and dry. He is not diaphoretic.  Psychiatric: He has a normal mood and affect. His behavior is normal.  Nursing note and vitals reviewed.   ED Course  Procedures (including critical care time) Labs Review Labs Reviewed  BASIC METABOLIC PANEL - Abnormal; Notable for the following:    Glucose, Bld 149 (*)    Creatinine, Ser 1.29 (*)    GFR calc non Af Amer 53 (*)    All other components within normal limits  CBC - Abnormal; Notable for the following:    Hemoglobin 10.9 (*)    HCT 35.6 (*)    MCV 70.2 (*)    MCH 21.5 (*)    RDW 17.7 (*)    Platelets 147 (*)    All other components within normal limits  DIFFERENTIAL  TROPONIN I  I-STAT TROPOININ, ED   BUN  Date Value Ref  Range Status  08/06/2015 12 6 - 20 mg/dL Final  06/12/2015 11 6 - 20 mg/dL Final  02/20/2015 12 6 - 20 mg/dL Final  12/29/2014 14 6 - 20 mg/dL Final   CREATININE, SER  Date Value Ref Range Status  08/06/2015 1.29* 0.61 - 1.24 mg/dL Final  06/12/2015 1.28* 0.61 - 1.24 mg/dL Final  02/20/2015 1.37* 0.61 - 1.24 mg/dL Final  12/29/2014 1.38* 0.61 - 1.24 mg/dL Final     Imaging Review Dg Chest 2 View  08/06/2015  CLINICAL DATA:  Pt c/o left arm and left-sided chest pain and congestion that began this morning. Pt took 2 tablets of nitroglycerin but it did not help. Hx of HTN, CAD, angina, and DM. Pt is a former smoker. EXAM: CHEST - 2 VIEW COMPARISON:  06/12/2015 FINDINGS: Coarse perihilar interstitial markings as before. No new airspace infiltrate or overt edema. Heart size normal. Atheromatous aorta. No effusion.  No pneumothorax. Anterior vertebral endplate spurring at multiple levels in the mid and lower thoracic spine. IMPRESSION: Chronic perihilar interstitial changes.  No acute disease. Electronically Signed   By: Lucrezia Europe M.D.   On: 08/06/2015 09:23   I have personally reviewed and evaluated these images and lab results as part of my medical decision-making.   EKG Interpretation   Date/Time:  Wednesday August 06 2015 09:08:59 EDT Ventricular Rate:  66 PR Interval:  226 QRS Duration: 92 QT Interval:  404 QTC Calculation: 423 R Axis:   48 Text Interpretation:  Sinus rhythm with 1st degree A-V block Otherwise  normal ECG No significant change since last tracing Artifact Abnormal ekg  Confirmed by Carmin Muskrat  MD [phone]) on 08/06/2015 9:10:41 AM Also  confirmed by Carmin Muskrat  MD ([place]), editor [place], Joelene Millin [phone])   on 08/06/2015 9:21:50 AM      Medications  aspirin chewable tablet 243 mg (243 mg Oral Given 08/06/15 1224)   Orders Placed This Encounter  Procedures  . DG Chest 2 View  . Basic metabolic panel  . CBC  . Differential  . Troponin I  . Inpatient  consult to Cardiology  . I-stat troponin, ED  . EKG 12-Lead  . ED EKG within 10 minutes  . Insert peripheral  IV    MDM   Final diagnoses:  Chest pain, unspecified chest pain type    Joel [name] presents with Chest pain and arm pain that began at rest at around 8 AM this morning.  Findings and plan of care discussed with Carmin Muskrat, MD. Dr. Vanita Panda personally evaluated and examined this patient.  Cardiac cath February 2014 with moderate LAD and RCA disease, severe OM2 stenosis treated with bare metal stent. Cardiac cath 08/01/13 with patent stent Circumflex and mild to moderate disease in the LAD and RCA. Suspicion for ACS, especially unstable angina. HEART score is 5, indicating moderate risk for a cardiac event. Wells criteria score is 1.5, indicating low risk for PE. No new EKG changes, troponin is negative.  11:42 AM Spoke with Trish from cardiology, who stated that the cardiologist would be down to see the patient shortly. Upon reevaluation, patient states that his pain is completely resolved. Patient has no additional complaints. 2:35 PM Jettie Booze, NP, Cardiology consultant under Dr. Angelena Form evaluated the patient and states that he is cleared to go home. No repeat troponin necessary. Patient to keep any upcoming appointments with cardiology and his PCP. This information was medicated with the patient, who agreed to the plan. Return precautions discussed. Patient voiced understanding of these instructions and is comfortable with discharge. Patient appears safe for discharge at this time.    Filed Vitals:   08/06/15 0909 08/06/15 1119  BP: 124/71 131/67  Pulse: 61 52  Temp: 97.3 F (36.3 C)   TempSrc: Oral   Resp: 18 12  SpO2: 100% 98%   Note: Upon initial attempt at patient contact, patient was not yet in the room.  Lorayne Bender, PA-C 08/06/15 [place], PA-C 08/06/15 1438  Carmin Muskrat, MD 08/06/15 [phone]

## 2015-08-06 NOTE — Discharge Instructions (Signed)
You have been seen today for chest pain. Your imaging and lab tests showed no abnormalities. Be sure to keep any previously scheduled appointments with your cardiologist. Follow up with PCP as needed. Return to ED should symptoms recur.

## 2015-08-06 NOTE — ED Notes (Signed)
Pt sts left arm pain while watching TV today that moved in left side of chest area; pt sts took 2 nitro without relief

## 2015-08-08 ENCOUNTER — Emergency Department (HOSPITAL_COMMUNITY)
Admission: EM | Admit: 2015-08-08 | Discharge: 2015-08-08 | Disposition: A | Discharge status: Home / Self Care | Payer: PPO | Attending: Emergency Medicine | Admitting: Emergency Medicine

## 2015-08-08 ENCOUNTER — Encounter (HOSPITAL_COMMUNITY): Payer: Self-pay | Admitting: Emergency Medicine

## 2015-08-08 DIAGNOSIS — I25119 Atherosclerotic heart disease of native coronary artery with unspecified angina pectoris: Secondary | ICD-10-CM | POA: Insufficient documentation

## 2015-08-08 DIAGNOSIS — Z79899 Other long term (current) drug therapy: Secondary | ICD-10-CM | POA: Diagnosis not present

## 2015-08-08 DIAGNOSIS — E785 Hyperlipidemia, unspecified: Secondary | ICD-10-CM | POA: Diagnosis not present

## 2015-08-08 DIAGNOSIS — Z9889 Other specified postprocedural states: Secondary | ICD-10-CM | POA: Diagnosis not present

## 2015-08-08 DIAGNOSIS — K219 Gastro-esophageal reflux disease without esophagitis: Secondary | ICD-10-CM | POA: Insufficient documentation

## 2015-08-08 DIAGNOSIS — D509 Iron deficiency anemia, unspecified: Secondary | ICD-10-CM | POA: Diagnosis not present

## 2015-08-08 DIAGNOSIS — Z7982 Long term (current) use of aspirin: Secondary | ICD-10-CM | POA: Diagnosis not present

## 2015-08-08 DIAGNOSIS — J441 Chronic obstructive pulmonary disease with (acute) exacerbation: Secondary | ICD-10-CM | POA: Diagnosis not present

## 2015-08-08 DIAGNOSIS — R0981 Nasal congestion: Secondary | ICD-10-CM | POA: Diagnosis not present

## 2015-08-08 DIAGNOSIS — Z7984 Long term (current) use of oral hypoglycemic drugs: Secondary | ICD-10-CM | POA: Insufficient documentation

## 2015-08-08 DIAGNOSIS — E119 Type 2 diabetes mellitus without complications: Secondary | ICD-10-CM | POA: Insufficient documentation

## 2015-08-08 DIAGNOSIS — I10 Essential (primary) hypertension: Secondary | ICD-10-CM | POA: Insufficient documentation

## 2015-08-08 DIAGNOSIS — R51 Headache: Secondary | ICD-10-CM

## 2015-08-08 DIAGNOSIS — Z87891 Personal history of nicotine dependence: Secondary | ICD-10-CM | POA: Diagnosis not present

## 2015-08-08 DIAGNOSIS — Z87828 Personal history of other (healed) physical injury and trauma: Secondary | ICD-10-CM | POA: Insufficient documentation

## 2015-08-08 DIAGNOSIS — Z8546 Personal history of malignant neoplasm of prostate: Secondary | ICD-10-CM | POA: Diagnosis not present

## 2015-08-08 DIAGNOSIS — Z86718 Personal history of other venous thrombosis and embolism: Secondary | ICD-10-CM | POA: Diagnosis not present

## 2015-08-08 DIAGNOSIS — J3489 Other specified disorders of nose and nasal sinuses: Secondary | ICD-10-CM | POA: Diagnosis not present

## 2015-08-08 DIAGNOSIS — R519 Headache, unspecified: Secondary | ICD-10-CM

## 2015-08-08 MED ORDER — GUAIFENESIN ER 600 MG PO TB12
1200.0000 mg | ORAL_TABLET | Freq: Two times a day (BID) | ORAL | Status: DC
  Administered 2015-08-08: 1200 mg via ORAL
  Filled 2015-08-08: qty 2

## 2015-08-08 MED ORDER — SALINE SPRAY 0.65 % NA SOLN: 2.0000 | NASAL | Status: DC | PRN

## 2015-08-08 MED ORDER — AMOXICILLIN-POT CLAVULANATE 875-125 MG PO TABS: 1.0000 | ORAL_TABLET | Freq: Two times a day (BID) | ORAL | Status: DC

## 2015-08-08 MED ORDER — ACETAMINOPHEN 500 MG PO TABS
1000.0000 mg | ORAL_TABLET | Freq: Once | ORAL | Status: AC
  Administered 2015-08-08: 1000 mg via ORAL
  Filled 2015-08-08: qty 2

## 2015-08-08 MED ORDER — FLUTICASONE PROPIONATE 50 MCG/ACT NA SUSP: 2.0000 | Freq: Every day | NASAL | Status: DC

## 2015-08-08 NOTE — ED Notes (Signed)
Pa  at bedside. 

## 2015-08-08 NOTE — ED Notes (Signed)
Pt states he has had a frontal headache off and on for the last week. Pt states about 1 hr ago while driving he became short of breath and felt his headache had worsen. Pt states at this time he doesn't have any shortness of breath but that it lasted until he drove himself to ED. Pt states he still has a 8/10 aching headache. Pt denies any blurry vision or dizziness. Pt is alert and ox4. Able to ambulate with steady gait.

## 2015-08-08 NOTE — Discharge Instructions (Signed)
You have been seen today for nasal congestion and headache. It is suspected that you are having a headache and your other symptoms from possible sinus infection. Drink plenty of fluids and get plenty of rest. Ibuprofen or Tylenol for pain or fever. Plain Mucinex may help relieve congestion. Follow up with PCP as needed should symptoms continue. Return to ED should symptoms worsen. Please take all of your antibiotics until finished!   You may develop abdominal discomfort or diarrhea from the antibiotic.  You may help offset this with probiotics which you can buy or get in yogurt. Do not eat or take the probiotics until 2 hours after your antibiotic.  The saline nasal spray may help relieve congestion, and may also be obtained over-the-counter. Generic saline nasal spray is adequate. Plain Mucinex may also help with congestion.

## 2015-08-08 NOTE — ED Notes (Signed)
MD at bedside. [place] 

## 2015-08-08 NOTE — ED Provider Notes (Signed)
CSN: [id number]     Arrival date & time 08/08/15  [id number] History   First MD Initiated Contact with Patient 08/08/15 1001     Chief Complaint  Patient presents with  . Shortness of Breath  . Headache     (Consider location/radiation/quality/duration/timing/severity/associated sxs/prior Treatment) HPI   Joel [name] is a 75 y.o. male, with a history of DM, prostate cancer, and hypertension, presenting to the ED with a recurrent headache that has been occurring daily for the last week. Pt also endorses nasal congestion that interferes with his breathing. Patients headache is bilateral frontal, 8/10, aching, nonradiating. Pt states he has had similar headaches in the past, but he usually doesn't get nasal congestion at the same time. Pt has been taking Allegra 180 mg daily for the last week, which was prescribed to him to help him with his congestion. Pt denies fever/chills, N/V, cough, shortness of breath, chest pain, visual disturbances, neuro deficits, or any other complaints.     Past Medical History  Diagnosis Date  . GERD (gastroesophageal reflux disease)   . HLD (hyperlipidemia)   . HTN (hypertension)   . Cough secondary to angiotensin converting enzyme inhibitor (ACE-I)   . Peripheral vascular disease ([place])   . CAD (coronary artery disease)     A. Negative MV 04/2010;  B. Inadequate Stress Echo 05/2011;  C. 05/29/11 Cath: - Med Rx;  D. 05/2012 Cath/PCI: LM nl, LAD 40p, LCX ectatic, OM1 138m, OM2 90p (3.5x16 Veriflex BMS), RCA dominant 40p/d, PDA 50p, EF 55-65%.  . Iron deficiency anemia   . DVT (deep venous thrombosis) ([place])   . Microcytic anemia   . COPD (chronic obstructive pulmonary disease) ([place])   . Angina   . Shortness of breath     "can come on at anytime" (11/09/2012)  . History of blood transfusion     "I've had 2; don't know what it was related to" (11/09/2012)  . Rotator cuff injury     "left arm; never repaired" (11/09/2012)  . Headache(784.0)     Hx: of  . Cancer  ([place])     Hx: of prostate  . Diabetes mellitus without complication ([place])   . Atypical chest pain    Past Surgical History  Procedure Laterality Date  . Iliac artery aneurysm repair    . Circumcision    . Pr vein bypass graft,aorto-fem-pop Left 10/01/2009    left below knee popliteal artery to posterior tibial artery  . Cataract extraction w/ intraocular lens implant Right ~ 2008  . Coronary angioplasty with stent placement  2014    "1" (11/09/2012)  . Left heart cath  05-31-12  . Cardiovascular stress test  Aug. 2014  . Shoulder arthroscopy with open rotator cuff repair and distal clavicle acrominectomy Left 01/12/2013    Procedure: LEFT SHOULDER ARTHROSCOPY WITH DEBRIDEMENT OPEN DISTAL CLAVICLE RESECTION ,acromioplastyAND ROTATOR CUFF REPAIR;  Surgeon: Yvette Rack., MD;  Location: [facility];  Service: Orthopedics;  Laterality: Left;  . Left heart catheterization with coronary angiogram N/A 06/01/2011    Procedure: LEFT HEART CATHETERIZATION WITH CORONARY ANGIOGRAM;  Surgeon: Hillary Bow, MD;  Location: [facility] CATH LAB;  Service: Cardiovascular;  Laterality: N/A;  . Left heart catheterization with coronary angiogram N/A 05/31/2012    Procedure: LEFT HEART CATHETERIZATION WITH CORONARY ANGIOGRAM;  Surgeon: Peter M Martinique, MD;  Location: [facility] CATH LAB;  Service: Cardiovascular;  Laterality: N/A;  . Percutaneous coronary stent intervention (pci-s)  05/31/2012    Procedure: PERCUTANEOUS CORONARY  STENT INTERVENTION (PCI-S);  Surgeon: Peter M Martinique, MD;  Location: [facility] CATH LAB;  Service: Cardiovascular;;  . Left heart catheterization with coronary angiogram N/A 08/01/2013    Procedure: LEFT HEART CATHETERIZATION WITH CORONARY ANGIOGRAM;  Surgeon: Burnell Blanks, MD;  Location: [facility] CATH LAB;  Service: Cardiovascular;  Laterality: N/A;   Family History  Problem Relation Age of Onset  . Breast cancer Mother     deceased  . Cancer Mother   . Hyperlipidemia Mother   . Hypertension Mother   .  COPD Father     deceased  . Cancer Father   . Hyperlipidemia Father   . Hypertension Father   . Hypertension Sister   . Cancer Brother   . Hypertension Brother   . Hypertension Daughter   . Hypertension Son    Social History  Substance Use Topics  . Smoking status: Former Smoker -- 1.00 packs/day for 55 years    Types: Cigarettes    Quit date: 05/26/2011  . Smokeless tobacco: Never Used  . Alcohol Use: No    Review of Systems  Constitutional: Negative for fever and chills.  Eyes: Negative for visual disturbance.  Respiratory: Negative for cough and shortness of breath.   Cardiovascular: Negative for chest pain.  Gastrointestinal: Negative for nausea and vomiting.  Genitourinary: Negative for dysuria.  Neurological: Positive for headaches. Negative for dizziness, syncope, weakness, light-headedness and numbness.  All other systems reviewed and are negative.     Allergies  Atenolol; Imdur; Lisinopril; Topiramate; and Tramadol-acetaminophen  Home Medications   Prior to Admission medications   Medication Sig Start Date End Date Taking? Authorizing Provider  albuterol (PROVENTIL HFA;VENTOLIN HFA) 108 (90 BASE) MCG/ACT inhaler Inhale 2 puffs into the lungs every 6 (six) hours as needed for wheezing or shortness of breath.   Yes Historical Provider, MD  amLODipine (NORVASC) 10 MG tablet Take 1 tablet (10 mg total) by mouth daily. 05/04/14  Yes Wandra Arthurs, MD  aspirin EC 81 MG tablet Take 81 mg by mouth daily.    Yes Historical Provider, MD  ferrous sulfate 325 (65 FE) MG tablet Take 325 mg by mouth daily with breakfast.   Yes Historical Provider, MD  fexofenadine (ALLEGRA) 180 MG tablet Take 180 mg by mouth daily.   Yes Historical Provider, MD  losartan-hydrochlorothiazide (HYZAAR) 100-25 MG per tablet Take 1 tablet by mouth daily. 09/18/14  Yes Burnell Blanks, MD  metFORMIN (GLUCOPHAGE) 500 MG tablet Take 500 mg by mouth 2 (two) times daily with a meal.   Yes Historical  Provider, MD  nitroGLYCERIN (NITROSTAT) 0.4 MG SL tablet Place 1 tablet (0.4 mg total) under the tongue every 5 (five) minutes as needed for chest pain. 04/16/15  Yes Burnell Blanks, MD  omeprazole (PRILOSEC) 20 MG capsule Take 20 mg by mouth 2 (two) times daily before a meal.   Yes Historical Provider, MD  potassium chloride (K-DUR,KLOR-CON) 10 MEQ tablet take 1 tablet by mouth once daily 08/02/13  Yes Burnell Blanks, MD  pravastatin (PRAVACHOL) 40 MG tablet Take 1 tablet (40 mg total) by mouth daily. 04/16/15  Yes Burnell Blanks, MD  traMADol (ULTRAM) 50 MG tablet Take 50 mg by mouth every 6 (six) hours as needed for moderate pain.   Yes Historical Provider, MD  amoxicillin-clavulanate (AUGMENTIN) 875-125 MG tablet Take 1 tablet by mouth every 12 (twelve) hours. 08/08/15   Shamyra Farias C Javonnie Illescas, PA-C  fluticasone (FLONASE) 50 MCG/ACT nasal spray Place 2 sprays into both  nostrils daily. 08/08/15   Nasirah Sachs C Emily Massar, PA-C  sodium chloride (OCEAN) 0.65 % SOLN nasal spray Place 2 sprays into both nostrils as needed for congestion. 08/08/15   Addilynne Olheiser C Ryane Canavan, PA-C   BP 139/70 mmHg  Pulse 54  Temp(Src) 98 F (36.7 C) (Oral)  Resp 13  Ht 5\' 9"  (1.753 m)  Wt 110.678 kg  BMI 36.02 kg/m2  SpO2 97% Physical Exam  Constitutional: He is oriented to person, place, and time. He appears well-developed and well-nourished. No distress.  HENT:  Head: Normocephalic and atraumatic.  Nose: Mucosal edema and rhinorrhea present. Right sinus exhibits no maxillary sinus tenderness and no frontal sinus tenderness. Left sinus exhibits no maxillary sinus tenderness and no frontal sinus tenderness.  Mouth/Throat: Oropharynx is clear and moist.  Eyes: Conjunctivae and EOM are normal. Pupils are equal, round, and reactive to light.  Neck: Normal range of motion. Neck supple.  Cardiovascular: Normal rate, regular rhythm, normal heart sounds and intact distal pulses.   Pulmonary/Chest: Effort normal and breath sounds  normal. No respiratory distress.  Abdominal: Soft. There is no tenderness. There is no guarding.  Musculoskeletal: He exhibits no edema or tenderness.  Lymphadenopathy:    He has no cervical adenopathy.  Neurological: He is alert and oriented to person, place, and time. He has normal reflexes.  No sensory deficits. Strength 5/5 in all extremities. No gait disturbance. Coordination intact. Cranial nerves III-XII grossly intact. No facial droop.   Skin: Skin is warm and dry. He is not diaphoretic.  Psychiatric: He has a normal mood and affect. His behavior is normal.  Nursing note and vitals reviewed.   ED Course  Procedures (including critical care time)   MDM   Final diagnoses:  Nasal congestion  Sinus headache    Joel [name] presents with nasal congestion and headache for the last week to week and a half.  Findings and plan of care discussed with Sherwood Gambler, MD. Dr. Regenia Skeeter personally evaluated and examined this patient.  In the triage note it mentions the patient had shortness of breath and that his headache was worse today than it has been in the past. Patient denied that this headache is worse. Patient clarified that the nasal congestion was making it difficult to breathe through his nose which is why he said that he had "difficulty breathing." Suspect this patient has a possible sinus infection causing a sinus headache. This was likely brought on initially by his nasal congestion. Patient has no red flag symptoms. Patient improved with Tylenol and Mucinex. Recommend saline nasal spray, Flonase, and Augmentin. Return precautions discussed. He should follow up with his PCP should symptoms continue. Patient voiced understanding of these instructions and is comfortable with discharge.  Filed Vitals:   08/08/15 0938 08/08/15 1015 08/08/15 1030 08/08/15 1100  BP: 151/71 126/66 137/68 139/70  Pulse: 62 66 59 54  Temp: 98 F (36.7 C)     TempSrc: Oral     Resp: 18 19 13 13    Height: 5\' 9"  (1.753 m)     Weight: 110.678 kg     SpO2: 97% 94% 97% 97%       Lorayne Bender, PA-C 08/08/15 [employer], MD 08/09/15 1559

## 2015-08-13 DIAGNOSIS — R51 Headache: Secondary | ICD-10-CM | POA: Diagnosis not present

## 2015-08-14 ENCOUNTER — Other Ambulatory Visit: Payer: Self-pay | Admitting: *Deleted

## 2015-08-14 DIAGNOSIS — I739 Peripheral vascular disease, unspecified: Secondary | ICD-10-CM

## 2015-08-14 DIAGNOSIS — Z48812 Encounter for surgical aftercare following surgery on the circulatory system: Secondary | ICD-10-CM

## 2015-08-22 ENCOUNTER — Encounter: Payer: Self-pay | Admitting: Vascular Surgery

## 2015-08-27 ENCOUNTER — Ambulatory Visit (INDEPENDENT_AMBULATORY_CARE_PROVIDER_SITE_OTHER): Payer: PPO | Admitting: Vascular Surgery

## 2015-08-27 ENCOUNTER — Ambulatory Visit (INDEPENDENT_AMBULATORY_CARE_PROVIDER_SITE_OTHER)
Admission: RE | Admit: 2015-08-27 | Discharge: 2015-08-27 | Disposition: A | Discharge status: Home / Self Care | Payer: PPO | Source: Ambulatory Visit | Attending: Vascular Surgery | Admitting: Vascular Surgery

## 2015-08-27 ENCOUNTER — Encounter: Payer: Self-pay | Admitting: Vascular Surgery

## 2015-08-27 ENCOUNTER — Ambulatory Visit (HOSPITAL_COMMUNITY)
Admission: RE | Admit: 2015-08-27 | Discharge: 2015-08-27 | Disposition: A | Discharge status: Home / Self Care | Payer: PPO | Source: Ambulatory Visit | Attending: Vascular Surgery | Admitting: Vascular Surgery

## 2015-08-27 VITALS — BP 117/66 | HR 56 | Temp 97.5°F | Resp 16 | Ht 70.0 in | Wt 240.6 lb

## 2015-08-27 DIAGNOSIS — Z48812 Encounter for surgical aftercare following surgery on the circulatory system: Secondary | ICD-10-CM | POA: Diagnosis not present

## 2015-08-27 DIAGNOSIS — I739 Peripheral vascular disease, unspecified: Secondary | ICD-10-CM

## 2015-08-27 DIAGNOSIS — Z95828 Presence of other vascular implants and grafts: Secondary | ICD-10-CM

## 2015-08-27 DIAGNOSIS — I779 Disorder of arteries and arterioles, unspecified: Secondary | ICD-10-CM | POA: Diagnosis not present

## 2015-08-27 NOTE — Progress Notes (Signed)
[facility]  Patient name: Joel [name] MRN: [id number] DOB: 1941-02-02 Sex: male  REASON FOR VISIT: Follow up of peripheral vascular disease.  HPI: LEMARCUS CHRISTINE is a 75 y.o. male who I last saw on 12/19/2013. He underwent a left below-knee popliteal to tibial artery bypass in June 2011. At the time of his last visit his bypass graft is working well and I set him up for a 1 year follow up visit. Since I saw him last, he denies any history of claudication, rest pain, or nonhealing ulcers.  He is on aspirin and is on a statin. He is not a smoker.  Past Medical History  Diagnosis Date  . GERD (gastroesophageal reflux disease)   . HLD (hyperlipidemia)   . HTN (hypertension)   . Cough secondary to angiotensin converting enzyme inhibitor (ACE-I)   . Peripheral vascular disease ([place])   . CAD (coronary artery disease)     A. Negative MV 04/2010;  B. Inadequate Stress Echo 05/2011;  C. 05/29/11 Cath: - Med Rx;  D. 05/2012 Cath/PCI: LM nl, LAD 40p, LCX ectatic, OM1 126m, OM2 90p (3.5x16 Veriflex BMS), RCA dominant 40p/d, PDA 50p, EF 55-65%.  . Iron deficiency anemia   . DVT (deep venous thrombosis) ([place])   . Microcytic anemia   . COPD (chronic obstructive pulmonary disease) ([place])   . Angina   . Shortness of breath     "can come on at anytime" (11/09/2012)  . History of blood transfusion     "I've had 2; don't know what it was related to" (11/09/2012)  . Rotator cuff injury     "left arm; never repaired" (11/09/2012)  . Headache(784.0)     Hx: of  . Cancer ([place])     Hx: of prostate  . Diabetes mellitus without complication ([place])   . Atypical chest pain     Family History  Problem Relation Age of Onset  . Breast cancer Mother     deceased  . Cancer Mother   . Hyperlipidemia Mother   . Hypertension Mother   . COPD Father     deceased  . Cancer Father   . Hyperlipidemia Father   . Hypertension Father   . Hypertension Sister   . Cancer Brother    . Hypertension Brother   . Hypertension Daughter   . Hypertension Son     SOCIAL HISTORY: Social History  Substance Use Topics  . Smoking status: Former Smoker -- 1.00 packs/day for 55 years    Types: Cigarettes    Quit date: 05/26/2011  . Smokeless tobacco: Never Used  . Alcohol Use: No    Allergies  Allergen Reactions  . Atenolol     bradycardia  . Imdur [Isosorbide Mononitrate] Nausea Only and Other (See Comments)    Headache  . Lisinopril Other (See Comments)    Cough  . Topiramate     Shortness of breath and leg cramps  . Tramadol-Acetaminophen Other (See Comments)    intolerance    Current Outpatient Prescriptions  Medication Sig Dispense Refill  . albuterol (PROVENTIL HFA;VENTOLIN HFA) 108 (90 BASE) MCG/ACT inhaler Inhale 2 puffs into the lungs every 6 (six) hours as needed for wheezing or shortness of breath.    Marland Kitchen amLODipine (NORVASC) 10 MG tablet Take 1 tablet (10 mg total) by mouth daily. 30 tablet 0  . aspirin EC 81 MG tablet Take 81 mg by mouth daily.     . ferrous sulfate 325 (65  FE) MG tablet Take 325 mg by mouth daily with breakfast.    . fexofenadine (ALLEGRA) 180 MG tablet Take 180 mg by mouth daily.    . fluticasone (FLONASE) 50 MCG/ACT nasal spray Place 2 sprays into both nostrils daily. 16 g 2  . losartan-hydrochlorothiazide (HYZAAR) 100-25 MG per tablet Take 1 tablet by mouth daily. 90 tablet 3  . nitroGLYCERIN (NITROSTAT) 0.4 MG SL tablet Place 1 tablet (0.4 mg total) under the tongue every 5 (five) minutes as needed for chest pain. 25 tablet 6  . omeprazole (PRILOSEC) 20 MG capsule Take 20 mg by mouth 2 (two) times daily before a meal.    . potassium chloride (K-DUR,KLOR-CON) 10 MEQ tablet take 1 tablet by mouth once daily 30 tablet 1  . pravastatin (PRAVACHOL) 40 MG tablet Take 1 tablet (40 mg total) by mouth daily. 30 tablet 11  . sodium chloride (OCEAN) 0.65 % SOLN nasal spray Place 2 sprays into both nostrils as needed for congestion. 50 mL 2  .  traMADol (ULTRAM) 50 MG tablet Take 50 mg by mouth every 6 (six) hours as needed for moderate pain.    . metFORMIN (GLUCOPHAGE) 500 MG tablet Take 500 mg by mouth 2 (two) times daily with a meal. Reported on 08/27/2015     No current facility-administered medications for this visit.    REVIEW OF SYSTEMS:  [X]  denotes positive finding, [ ]  denotes negative finding Cardiac  Comments:  Chest pain or chest pressure:    Shortness of breath upon exertion:    Short of breath when lying flat:    Irregular heart rhythm:        Vascular    Pain in calf, thigh, or hip brought on by ambulation:    Pain in feet at night that wakes you up from your sleep:     Blood clot in your veins:    Leg swelling:         Pulmonary    Oxygen at home:    Productive cough:     Wheezing:         Neurologic    Sudden weakness in arms or legs:     Sudden numbness in arms or legs:     Sudden onset of difficulty speaking or slurred speech:    Temporary loss of vision in one eye:     Problems with dizziness:         Gastrointestinal    Blood in stool:     Vomited blood:         Genitourinary    Burning when urinating:     Blood in urine:        Psychiatric    Major depression:         Hematologic    Bleeding problems:    Problems with blood clotting too easily:        Skin    Rashes or ulcers:        Constitutional    Fever or chills:      PHYSICAL EXAM: Filed Vitals:   08/27/15 1147  BP: 117/66  Pulse: 56  Temp: 97.5 F (36.4 C)  TempSrc: Oral  Resp: 16  Height: 5\' 10"  (1.778 m)  Weight: 240 lb 9.6 oz (109.135 kg)  SpO2: 98%    GENERAL: The patient is a well-nourished male, in no acute distress. The vital signs are documented above. CARDIAC: There is a regular rate and rhythm.  VASCULAR: I did not detect carotid  bruits. On the left side, which is the side of his bypass, he has a palpable femoral, popliteal, and posterior tibial pulse. On the right side, he has a palpable femoral  and popliteal pulse. I cannot palpate pedal pulses. Both feet are warm and well-perfused. He has no significant lower extremity swelling. PULMONARY: There is good air exchange bilaterally without wheezing or rales. ABDOMEN: Soft and non-tender with normal pitched bowel sounds.  MUSCULOSKELETAL: There are no major deformities or cyanosis. NEUROLOGIC: No focal weakness or paresthesias are detected. SKIN: There are no ulcers or rashes noted. PSYCHIATRIC: The patient has a normal affect.  DATA:   LOWER EXTREMITY ARTERIAL DUPLEX: I have independently interpreted his duplex of his bypass graft. This shows that the bypass graft is patent without any areas of significant stenosis noted.  LOWER EXTREMITY ARTERIAL DOPPLER: I have independently interpreted his lower extremity arterial Doppler study. This shows a biphasic right posterior tibial signal with a monophasic anterior tibial signal. ABI on the right is 71% which is down slightly compared to 93% a year ago.  On the left side, which is the side of the bypass, ABI is 100%. He has a triphasic posterior tibial signal and a biphasic dorsalis pedis signal.  MEDICAL ISSUES:  STATUS POST LEFT POPLITEAL TO POSTERIOR TIBIAL ARTERY BYPASS: his bypass graft which was done in 2011 is functioning nicely without any problems identified. We will continue to follow this on a yearly basis. Fortunately he is not a smoker. He is on aspirin and is on a statin.  He has had a slight drop in his ABI on the right although he is really asymptomatic. 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 I ordered follow up ABIs and one urinal seen back that time. He knows to call sooner if he has problems.    Deitra Mayo [facility] [place]: [phone]

## 2015-09-03 DIAGNOSIS — E119 Type 2 diabetes mellitus without complications: Secondary | ICD-10-CM | POA: Diagnosis not present

## 2015-09-03 DIAGNOSIS — R42 Dizziness and giddiness: Secondary | ICD-10-CM | POA: Diagnosis not present

## 2015-09-03 DIAGNOSIS — R110 Nausea: Secondary | ICD-10-CM | POA: Diagnosis not present

## 2015-09-11 DIAGNOSIS — R42 Dizziness and giddiness: Secondary | ICD-10-CM | POA: Diagnosis not present

## 2015-09-11 DIAGNOSIS — R110 Nausea: Secondary | ICD-10-CM | POA: Diagnosis not present

## 2015-09-13 ENCOUNTER — Other Ambulatory Visit: Payer: Self-pay | Admitting: Cardiovascular Disease

## 2015-09-17 DIAGNOSIS — Z049 Encounter for examination and observation for unspecified reason: Secondary | ICD-10-CM | POA: Diagnosis not present

## 2015-09-17 DIAGNOSIS — R51 Headache: Secondary | ICD-10-CM | POA: Diagnosis not present

## 2015-09-17 DIAGNOSIS — Z79899 Other long term (current) drug therapy: Secondary | ICD-10-CM | POA: Diagnosis not present

## 2015-09-20 ENCOUNTER — Other Ambulatory Visit: Payer: Self-pay | Admitting: Specialist

## 2015-09-20 DIAGNOSIS — I729 Aneurysm of unspecified site: Secondary | ICD-10-CM

## 2015-09-24 DIAGNOSIS — M542 Cervicalgia: Secondary | ICD-10-CM | POA: Diagnosis not present

## 2015-09-24 DIAGNOSIS — R51 Headache: Secondary | ICD-10-CM | POA: Diagnosis not present

## 2015-09-24 DIAGNOSIS — G518 Other disorders of facial nerve: Secondary | ICD-10-CM | POA: Diagnosis not present

## 2015-09-24 DIAGNOSIS — M791 Myalgia: Secondary | ICD-10-CM | POA: Diagnosis not present

## 2015-09-29 ENCOUNTER — Encounter: Payer: Self-pay | Admitting: Family Medicine

## 2015-09-29 ENCOUNTER — Ambulatory Visit (INDEPENDENT_AMBULATORY_CARE_PROVIDER_SITE_OTHER): Payer: PPO | Admitting: Family Medicine

## 2015-09-29 VITALS — BP 116/64 | HR 64 | Ht 69.5 in | Wt 241.8 lb

## 2015-09-29 DIAGNOSIS — Z7689 Persons encountering health services in other specified circumstances: Secondary | ICD-10-CM

## 2015-09-29 DIAGNOSIS — Z7189 Other specified counseling: Secondary | ICD-10-CM | POA: Diagnosis not present

## 2015-09-29 DIAGNOSIS — I10 Essential (primary) hypertension: Secondary | ICD-10-CM | POA: Diagnosis not present

## 2015-09-29 DIAGNOSIS — K219 Gastro-esophageal reflux disease without esophagitis: Secondary | ICD-10-CM | POA: Diagnosis not present

## 2015-09-29 NOTE — Progress Notes (Signed)
   Subjective:    [name], male    DOB: 08-Jan-1941, 75 y.o.   MRN: [id number]  HPI Chief Complaint  Patient presents with  . new pt    new pt get established. no concerns   He states he is here to meet me and is interested in switching his primary care to this office. He has been going to a different primary care office and states he does not get answers to his questions as quickly as he would like.  He also goes to the [facility] in [place] and states they recently did a prostate biopsy. He states no further intervention was recommended and he is currently taking tamsulosin.  States he has a cardiologist, vascular surgeon, and is being seen at the headache clinic for chronic headaches.  He denies concerns or complaints today. States his blood pressure is doing well and no issues with current medications.  Reports reflux was bad but is doing well by taking daily Omeprazole.  He was in the [employer] for 10 years. Lives alone now. States he is pretty active, does not like to sit still. Feels like his health overall is "pretty good".    His wife passed in 2006, they were separated. 5 children. 1 daughter lives in [place].   No longer smokes, drinks, denies drug use.   Denies fever, chills, sweats, fatigue, chest pain, dizziness, DOE, lower extremity edema.   Reviewed allergies, medications, past medical, surgical and social history.   Review of Systems Pertinent positives and negatives in the history of present illness.     Objective:   Physical Exam BP 116/64 mmHg  Pulse 64  Ht 5' 9.5" (1.765 m)  Wt 241 lb 12.8 oz (109.68 kg)  BMI 35.21 kg/m2  Alert and in no distress. Pharyngeal area is normal. Cardiac exam shows a regular rhythm without murmurs or gallops. Lungs are clear to auscultation.      Assessment & Plan:  Essential hypertension  Gastroesophageal reflux disease, esophagitis presence not specified  Encounter to establish care  He is interested in  establishing care in this office and having me as his PCP. We reviewed his past medical history, medications and expectations of the patient-provider relationship. He will let me know if he is going to establish care with this office and will sign a release of medical records since his previous PCP in not in our EMR. Discussed that he will continue seeing specialists such as cardiologists, headache clinic, and will go to the [facility] as needed or directed. Discussed that I will manage his primary care if he decides to switch practices.  His blood pressure is within goal range today and no further intervention needed. He reports reflux is well managed with omeprazole, continue this for now but will need to discuss using this with the least effective dose due to possible long term side effects.   Will follow up as needed and if he decides to establish with this office.  Spent a minimum of 30 minutes face to face with patient.

## 2015-10-02 ENCOUNTER — Telehealth: Payer: Self-pay

## 2015-10-02 ENCOUNTER — Ambulatory Visit
Admission: RE | Admit: 2015-10-02 | Discharge: 2015-10-02 | Disposition: A | Discharge status: Home / Self Care | Payer: PPO | Source: Ambulatory Visit | Attending: Specialist | Admitting: Specialist

## 2015-10-02 DIAGNOSIS — I6603 Occlusion and stenosis of bilateral middle cerebral arteries: Secondary | ICD-10-CM | POA: Diagnosis not present

## 2015-10-02 DIAGNOSIS — I729 Aneurysm of unspecified site: Secondary | ICD-10-CM

## 2015-10-02 NOTE — Telephone Encounter (Signed)
Pts records came in from [facility] [place]

## 2015-10-06 ENCOUNTER — Emergency Department (HOSPITAL_COMMUNITY): Payer: PPO

## 2015-10-06 ENCOUNTER — Encounter (HOSPITAL_COMMUNITY): Payer: Self-pay | Admitting: Nurse Practitioner

## 2015-10-06 ENCOUNTER — Emergency Department (HOSPITAL_COMMUNITY)
Admission: EM | Admit: 2015-10-06 | Discharge: 2015-10-06 | Disposition: A | Discharge status: Home / Self Care | Payer: PPO | Attending: Emergency Medicine | Admitting: Emergency Medicine

## 2015-10-06 ENCOUNTER — Other Ambulatory Visit: Payer: Self-pay

## 2015-10-06 DIAGNOSIS — E119 Type 2 diabetes mellitus without complications: Secondary | ICD-10-CM | POA: Insufficient documentation

## 2015-10-06 DIAGNOSIS — Z87891 Personal history of nicotine dependence: Secondary | ICD-10-CM | POA: Diagnosis not present

## 2015-10-06 DIAGNOSIS — I2510 Atherosclerotic heart disease of native coronary artery without angina pectoris: Secondary | ICD-10-CM | POA: Diagnosis not present

## 2015-10-06 DIAGNOSIS — Z7984 Long term (current) use of oral hypoglycemic drugs: Secondary | ICD-10-CM | POA: Diagnosis not present

## 2015-10-06 DIAGNOSIS — J449 Chronic obstructive pulmonary disease, unspecified: Secondary | ICD-10-CM | POA: Insufficient documentation

## 2015-10-06 DIAGNOSIS — I10 Essential (primary) hypertension: Secondary | ICD-10-CM | POA: Insufficient documentation

## 2015-10-06 DIAGNOSIS — R0789 Other chest pain: Secondary | ICD-10-CM | POA: Insufficient documentation

## 2015-10-06 DIAGNOSIS — R079 Chest pain, unspecified: Secondary | ICD-10-CM | POA: Diagnosis not present

## 2015-10-06 DIAGNOSIS — Z8546 Personal history of malignant neoplasm of prostate: Secondary | ICD-10-CM | POA: Insufficient documentation

## 2015-10-06 DIAGNOSIS — Z79899 Other long term (current) drug therapy: Secondary | ICD-10-CM | POA: Insufficient documentation

## 2015-10-06 DIAGNOSIS — Z7982 Long term (current) use of aspirin: Secondary | ICD-10-CM | POA: Diagnosis not present

## 2015-10-06 LAB — BASIC METABOLIC PANEL
Anion gap: 4 — ABNORMAL LOW (ref 5–15)
BUN: 9 mg/dL (ref 6–20)
CO2: 29 mmol/L (ref 22–32)
Calcium: 9.4 mg/dL (ref 8.9–10.3)
Chloride: 106 mmol/L (ref 101–111)
Creatinine, Ser: 1.36 mg/dL — ABNORMAL HIGH (ref 0.61–1.24)
GFR calc Af Amer: 57 mL/min — ABNORMAL LOW (ref >60)
GFR calc non Af Amer: 49 mL/min — ABNORMAL LOW (ref >60)
Glucose, Bld: 121 mg/dL — ABNORMAL HIGH (ref 65–99)
Potassium: 4 mmol/L (ref 3.5–5.1)
Sodium: 139 mmol/L (ref 135–145)

## 2015-10-06 LAB — CBC
HCT: 36.5 % — ABNORMAL LOW (ref 39.0–52.0)
Hemoglobin: 11.1 g/dL — ABNORMAL LOW (ref 13.0–17.0)
MCH: 21.8 pg — ABNORMAL LOW (ref 26.0–34.0)
MCHC: 30.4 g/dL (ref 30.0–36.0)
MCV: 71.7 fL — ABNORMAL LOW (ref 78.0–100.0)
Platelets: 159 10*3/uL (ref 150–400)
RBC: 5.09 MIL/uL (ref 4.22–5.81)
RDW: 16.9 % — ABNORMAL HIGH (ref 11.5–15.5)
WBC: 4.4 10*3/uL (ref 4.0–10.5)

## 2015-10-06 LAB — I-STAT TROPONIN, ED
Troponin i, poc: 0 ng/mL (ref 0.00–0.08)
Troponin i, poc: 0 ng/mL (ref 0.00–0.08)

## 2015-10-06 MED ORDER — ASPIRIN 81 MG PO CHEW
243.0000 mg | CHEWABLE_TABLET | Freq: Once | ORAL | Status: AC
  Administered 2015-10-06: 243 mg via ORAL
  Filled 2015-10-06: qty 3

## 2015-10-06 MED ORDER — NITROGLYCERIN 0.4 MG SL SUBL
0.4000 mg | SUBLINGUAL_TABLET | SUBLINGUAL | Status: DC | PRN
  Administered 2015-10-06: 0.4 mg via SUBLINGUAL
  Filled 2015-10-06: qty 1

## 2015-10-06 NOTE — ED Notes (Signed)
Patient able to ambulate independently  

## 2015-10-06 NOTE — ED Notes (Signed)
Pt from home has had left sided chest pain started on Sunday and has been intermittent. Today Chest pain has been persistent all day, when patient got into his truck aroun 3pm he had sharp 8/10 left sided chest pain. Patient took 2 nitroglycerin and a tramadol tablet with some chest pain relief. Patient sts has constant dull chest pain still. Patient has taken 1 baby ASA today.

## 2015-10-06 NOTE — Discharge Instructions (Signed)
Nonspecific Chest Pain  °Chest pain can be caused by many different conditions. There is always a chance that your pain could be related to something serious, such as a heart attack or a blood clot in your lungs. Chest pain can also be caused by conditions that are not life-threatening. If you have chest pain, it is very important to follow up with your health care provider. °CAUSES  °Chest pain can be caused by: °· Heartburn. °· Pneumonia or bronchitis. °· Anxiety or stress. °· Inflammation around your heart (pericarditis) or lung (pleuritis or pleurisy). °· A blood clot in your lung. °· A collapsed lung (pneumothorax). It can develop suddenly on its own (spontaneous pneumothorax) or from trauma to the chest. °· Shingles infection (varicella-zoster virus). °· Heart attack. °· Damage to the bones, muscles, and cartilage that make up your chest wall. This can include: °¨ Bruised bones due to injury. °¨ Strained muscles or cartilage due to frequent or repeated coughing or overwork. °¨ Fracture to one or more ribs. °¨ Sore cartilage due to inflammation (costochondritis). °RISK FACTORS  °Risk factors for chest pain may include: °· Activities that increase your risk for trauma or injury to your chest. °· Respiratory infections or conditions that cause frequent coughing. °· Medical conditions or overeating that can cause heartburn. °· Heart disease or family history of heart disease. °· Conditions or health behaviors that increase your risk of developing a blood clot. °· Having had chicken pox (varicella zoster). °SIGNS AND SYMPTOMS °Chest pain can feel like: °· Burning or tingling on the surface of your chest or deep in your chest. °· Crushing, pressure, aching, or squeezing pain. °· Dull or sharp pain that is worse when you move, cough, or take a deep breath. °· Pain that is also felt in your back, neck, shoulder, or arm, or pain that spreads to any of these areas. °Your chest pain may come and go, or it may stay  constant. °DIAGNOSIS °Lab tests or other studies may be needed to find the cause of your pain. Your health care provider may have you take a test called an ambulatory ECG (electrocardiogram). An ECG records your heartbeat patterns at the time the test is performed. You may also have other tests, such as: °· Transthoracic echocardiogram (TTE). During echocardiography, sound waves are used to create a picture of all of the heart structures and to look at how blood flows through your heart. °· Transesophageal echocardiogram (TEE). This is a more advanced imaging test that obtains images from inside your body. It allows your health care provider to see your heart in finer detail. °· Cardiac monitoring. This allows your health care provider to monitor your heart rate and rhythm in real time. °· Holter monitor. This is a portable device that records your heartbeat and can help to diagnose abnormal heartbeats. It allows your health care provider to track your heart activity for several days, if needed. °· Stress tests. These can be done through exercise or by taking medicine that makes your heart beat more quickly. °· Blood tests. °· Imaging tests. °TREATMENT  °Your treatment depends on what is causing your chest pain. Treatment may include: °· Medicines. These may include: °¨ Acid blockers for heartburn. °¨ Anti-inflammatory medicine. °¨ Pain medicine for inflammatory conditions. °¨ Antibiotic medicine, if an infection is present. °¨ Medicines to dissolve blood clots. °¨ Medicines to treat coronary artery disease. °· Supportive care for conditions that do not require medicines. This may include: °¨ Resting. °¨ Applying heat   or cold packs to injured areas. °¨ Limiting activities until pain decreases. °HOME CARE INSTRUCTIONS °· If you were prescribed an antibiotic medicine, finish it all even if you start to feel better. °· Avoid any activities that bring on chest pain. °· Do not use any tobacco products, including  cigarettes, chewing tobacco, or electronic cigarettes. If you need help quitting, ask your health care provider. °· Do not drink alcohol. °· Take medicines only as directed by your health care provider. °· Keep all follow-up visits as directed by your health care provider. This is important. This includes any further testing if your chest pain does not go away. °· If heartburn is the cause for your chest pain, you may be told to keep your head raised (elevated) while sleeping. This reduces the chance that acid will go from your stomach into your esophagus. °· Make lifestyle changes as directed by your health care provider. These may include: °¨ Getting regular exercise. Ask your health care provider to suggest some activities that are safe for you. °¨ Eating a heart-healthy diet. A registered dietitian can help you to learn healthy eating options. °¨ Maintaining a healthy weight. °¨ Managing diabetes, if necessary. °¨ Reducing stress. °SEEK MEDICAL CARE IF: °· Your chest pain does not go away after treatment. °· You have a rash with blisters on your chest. °· You have a fever. °SEEK IMMEDIATE MEDICAL CARE IF:  °· Your chest pain is worse. °· You have an increasing cough, or you cough up blood. °· You have severe abdominal pain. °· You have severe weakness. °· You faint. °· You have chills. °· You have sudden, unexplained chest discomfort. °· You have sudden, unexplained discomfort in your arms, back, neck, or jaw. °· You have shortness of breath at any time. °· You suddenly start to sweat, or your skin gets clammy. °· You feel nauseous or you vomit. °· You suddenly feel light-headed or dizzy. °· Your heart begins to beat quickly, or it feels like it is skipping beats. °These symptoms may represent a serious problem that is an emergency. Do not wait to see if the symptoms will go away. Get medical help right away. Call your local emergency services (911 in the [place] Do not drive yourself to the hospital. °  °This  information is not intended to replace advice given to you by your health care provider. Make sure you discuss any questions you have with your health care provider. °  °Document Released: 01/06/2005 Document Revised: 04/19/2014 Document Reviewed: 11/02/2013 °Elsevier Interactive Patient Education ©2016 [employer] ° °

## 2015-10-06 NOTE — ED Provider Notes (Signed)
CSN: [id number]     Arrival date & time 10/06/15  1619 History   First MD Initiated Contact with Patient 10/06/15 1948     Chief Complaint  Patient presents with  . Chest Pain     (Consider location/radiation/quality/duration/timing/severity/associated sxs/prior Treatment) Patient is a 75 y.o. male presenting with chest pain.  Chest Pain Pain location:  L chest Pain quality: dull and sharp   Pain radiates to:  Does not radiate Pain radiates to the back: no   Pain severity:  Moderate Onset quality:  Sudden Duration:  2 days Timing:  Constant Progression:  Waxing and waning Chronicity:  Recurrent Relieved by:  Nothing Worsened by:  Nothing tried Ineffective treatments:  None tried Associated symptoms: no abdominal pain, no fever, no headache, no palpitations, no shortness of breath and not vomiting    75 yo M With a chief complaint chest pain. Patient has had similar pains this before. He thinks they may be similar to the times a day was stented previously. Denies exertional symptoms. Denies shortness of breath. Denies diaphoresis or radiation of the pain. Patient has difficulty describing the pain.  Past Medical History  Diagnosis Date  . GERD (gastroesophageal reflux disease)   . HLD (hyperlipidemia)   . HTN (hypertension)   . Cough secondary to angiotensin converting enzyme inhibitor (ACE-I)   . Peripheral vascular disease ([place])   . CAD (coronary artery disease)     A. Negative MV 04/2010;  B. Inadequate Stress Echo 05/2011;  C. 05/29/11 Cath: - Med Rx;  D. 05/2012 Cath/PCI: LM nl, LAD 40p, LCX ectatic, OM1 177m, OM2 90p (3.5x16 Veriflex BMS), RCA dominant 40p/d, PDA 50p, EF 55-65%.  . Iron deficiency anemia   . DVT (deep venous thrombosis) ([place])   . Microcytic anemia   . COPD (chronic obstructive pulmonary disease) ([place])   . Angina   . Shortness of breath     "can come on at anytime" (11/09/2012)  . History of blood transfusion     "I've had 2; don't know what it was  related to" (11/09/2012)  . Rotator cuff injury     "left arm; never repaired" (11/09/2012)  . Headache(784.0)     Hx: of  . Cancer ([place])     Hx: of prostate  . Diabetes mellitus without complication ([place])   . Atypical chest pain    Past Surgical History  Procedure Laterality Date  . Iliac artery aneurysm repair    . Circumcision    . Pr vein bypass graft,aorto-fem-pop Left 10/01/2009    left below knee popliteal artery to posterior tibial artery  . Cataract extraction w/ intraocular lens implant Right ~ 2008  . Coronary angioplasty with stent placement  2014    "1" (11/09/2012)  . Left heart cath  05-31-12  . Cardiovascular stress test  Aug. 2014  . Shoulder arthroscopy with open rotator cuff repair and distal clavicle acrominectomy Left 01/12/2013    Procedure: LEFT SHOULDER ARTHROSCOPY WITH DEBRIDEMENT OPEN DISTAL CLAVICLE RESECTION ,acromioplastyAND ROTATOR CUFF REPAIR;  Surgeon: Yvette Rack., MD;  Location: [facility];  Service: Orthopedics;  Laterality: Left;  . Left heart catheterization with coronary angiogram N/A 06/01/2011    Procedure: LEFT HEART CATHETERIZATION WITH CORONARY ANGIOGRAM;  Surgeon: Hillary Bow, MD;  Location: [facility] CATH LAB;  Service: Cardiovascular;  Laterality: N/A;  . Left heart catheterization with coronary angiogram N/A 05/31/2012    Procedure: LEFT HEART CATHETERIZATION WITH CORONARY ANGIOGRAM;  Surgeon: Peter M Martinique, MD;  Location:  [facility] CATH LAB;  Service: Cardiovascular;  Laterality: N/A;  . Percutaneous coronary stent intervention (pci-s)  05/31/2012    Procedure: PERCUTANEOUS CORONARY STENT INTERVENTION (PCI-S);  Surgeon: Peter M Martinique, MD;  Location: [facility] CATH LAB;  Service: Cardiovascular;;  . Left heart catheterization with coronary angiogram N/A 08/01/2013    Procedure: LEFT HEART CATHETERIZATION WITH CORONARY ANGIOGRAM;  Surgeon: Burnell Blanks, MD;  Location: [facility] CATH LAB;  Service: Cardiovascular;  Laterality: N/A;   Family History  Problem  Relation Age of Onset  . Breast cancer Mother     deceased  . Cancer Mother   . Hyperlipidemia Mother   . Hypertension Mother   . COPD Father     deceased  . Cancer Father   . Hyperlipidemia Father   . Hypertension Father   . Hypertension Sister   . Cancer Brother   . Hypertension Brother   . Hypertension Daughter   . Hypertension Son    Social History  Substance Use Topics  . Smoking status: Former Smoker -- 1.00 packs/day for 55 years    Types: Cigarettes    Quit date: 05/26/2011  . Smokeless tobacco: Never Used  . Alcohol Use: No    Review of Systems  Constitutional: Negative for fever and chills.  HENT: Negative for congestion and facial swelling.   Eyes: Negative for discharge and visual disturbance.  Respiratory: Negative for shortness of breath.   Cardiovascular: Positive for chest pain. Negative for palpitations.  Gastrointestinal: Negative for vomiting, abdominal pain and diarrhea.  Musculoskeletal: Negative for myalgias and arthralgias.  Skin: Negative for color change and rash.  Neurological: Negative for tremors, syncope and headaches.  Psychiatric/Behavioral: Negative for confusion and dysphoric mood.      Allergies  Atenolol; Imdur; Lisinopril; Topiramate; and Tramadol-acetaminophen  Home Medications   Prior to Admission medications   Medication Sig Start Date End Date Taking? Authorizing Provider  albuterol (PROVENTIL HFA;VENTOLIN HFA) 108 (90 BASE) MCG/ACT inhaler Inhale 2 puffs into the lungs every 6 (six) hours as needed for wheezing or shortness of breath.   Yes Historical Provider, MD  amLODipine (NORVASC) 10 MG tablet Take 1 tablet (10 mg total) by mouth daily. 05/04/14  Yes Wandra Arthurs, MD  aspirin EC 81 MG tablet Take 81 mg by mouth daily.    Yes Historical Provider, MD  ferrous sulfate 325 (65 FE) MG tablet Take 325 mg by mouth daily with breakfast.   Yes Historical Provider, MD  gabapentin (NEURONTIN) 100 MG capsule Take 100 mg by mouth as  needed (for headaches in daytime).    Yes Historical Provider, MD  gabapentin (NEURONTIN) 300 MG capsule Take 300 mg by mouth at bedtime.   Yes Historical Provider, MD  losartan-hydrochlorothiazide (HYZAAR) 100-25 MG tablet Take 1 tablet by mouth daily. Please call and schedule a six month follow up appointment 09/15/15  Yes Burnell Blanks, MD  metFORMIN (GLUCOPHAGE) 500 MG tablet Take 500 mg by mouth 2 (two) times daily with a meal. Reported on 08/27/2015   Yes Historical Provider, MD  nitroGLYCERIN (NITROSTAT) 0.4 MG SL tablet Place 1 tablet (0.4 mg total) under the tongue every 5 (five) minutes as needed for chest pain. 04/16/15  Yes Burnell Blanks, MD  omeprazole (PRILOSEC) 20 MG capsule Take 20 mg by mouth 2 (two) times daily before a meal.   Yes Historical Provider, MD  pravastatin (PRAVACHOL) 40 MG tablet Take 1 tablet (40 mg total) by mouth daily. Patient taking differently: Take 40 mg by  mouth every evening.  04/16/15  Yes Burnell Blanks, MD  tamsulosin (FLOMAX) 0.4 MG CAPS capsule Take 0.4 mg by mouth daily after supper.    Yes Historical Provider, MD  traMADol (ULTRAM) 50 MG tablet Take 50 mg by mouth every 6 (six) hours as needed for moderate pain.   Yes Historical Provider, MD   BP 130/80 mmHg  Pulse 55  Temp(Src) 97.9 F (36.6 C) (Oral)  Resp 15  Ht 5\' 9"  (1.753 m)  Wt 241 lb (109.317 kg)  BMI 35.57 kg/m2  SpO2 100% Physical Exam  Constitutional: He is oriented to person, place, and time. He appears well-developed and well-nourished.  HENT:  Head: Normocephalic and atraumatic.  Eyes: EOM are normal. Pupils are equal, round, and reactive to light.  Neck: Normal range of motion. Neck supple. No JVD present.  Cardiovascular: Normal rate and regular rhythm.  Exam reveals no gallop and no friction rub.   No murmur heard. Pulmonary/Chest: No respiratory distress. He has no wheezes.  Abdominal: He exhibits no distension. There is no tenderness. There is no rebound  and no guarding.  Musculoskeletal: Normal range of motion.  Neurological: He is alert and oriented to person, place, and time.  Skin: No rash noted. No pallor.  Psychiatric: He has a normal mood and affect. His behavior is normal.  Nursing note and vitals reviewed.   ED Course  Procedures (including critical care time) Labs Review Labs Reviewed  BASIC METABOLIC PANEL - Abnormal; Notable for the following:    Glucose, Bld 121 (*)    Creatinine, Ser 1.36 (*)    GFR calc non Af Amer 49 (*)    GFR calc Af Amer 57 (*)    Anion gap 4 (*)    All other components within normal limits  CBC - Abnormal; Notable for the following:    Hemoglobin 11.1 (*)    HCT 36.5 (*)    MCV 71.7 (*)    MCH 21.8 (*)    RDW 16.9 (*)    All other components within normal limits  I-STAT TROPOININ, ED  Randolm Idol, ED    Imaging Review Dg Chest 2 View  10/06/2015  CLINICAL DATA:  LEFT-sided chest pain for 1 day.  Former smoker. EXAM: CHEST  2 VIEW COMPARISON:  08/06/2015 FINDINGS: The heart size and mediastinal contours are within normal limits. Chronic perihilar bronchitic changes. Both lungs are clear. Aortic arch calcification. The visualized skeletal structures are unremarkable. IMPRESSION: No active cardiopulmonary disease. Chronic bronchitic changes, stable from priors. Aortic atherosclerosis. Electronically Signed   By: Staci Righter M.D.   On: 10/06/2015 17:01   I have personally reviewed and evaluated these images and lab results as part of my medical decision-making.   EKG Interpretation   Date/Time:  Monday October 06 2015 16:27:09 EDT Ventricular Rate:  61 PR Interval:  224 QRS Duration: 90 QT Interval:  410 QTC Calculation: 412 R Axis:   53 Text Interpretation:  Sinus rhythm with 1st degree A-V block Otherwise  normal ECG No significant change since last tracing Confirmed by Naryah Clenney MD,  Quillian Quince [id number]) on 10/06/2015 10:56:24 PM      MDM   Final diagnoses:  Chest pain, unspecified  chest pain type    75 yo M with atypical chest pain. Initial troponin EKG chest x-ray are unremarkable. Discussed this with the on-call cardiologist, Dr. Aundra Dubin, recommended delta trop.    Delta negative, pain resolved.  D/c home.  10:56 PM:  I have discussed  the diagnosis/risks/treatment options with the patient and family and believe the pt to be eligible for discharge home to follow-up with PCP. We also discussed returning to the ED immediately if new or worsening sx occur. We discussed the sx which are most concerning (e.g., sudden worsening pain, fever, inability to tolerate by mouth) that necessitate immediate return. Medications administered to the patient during their visit and any new prescriptions provided to the patient are listed below.  Medications given during this visit Medications  nitroGLYCERIN (NITROSTAT) SL tablet 0.4 mg (0.4 mg Sublingual Given 10/06/15 2017)  aspirin chewable tablet 243 mg (243 mg Oral Given 10/06/15 2016)    Discharge Medication List as of 10/06/2015  9:01 PM      The patient appears reasonably screen and/or stabilized for discharge and I doubt any other medical condition or other [facility] requiring further screening, evaluation, or treatment in the ED at this time prior to discharge.      Deno Etienne, DO 10/06/15 2256

## 2015-10-16 ENCOUNTER — Encounter: Payer: Self-pay | Admitting: Family Medicine

## 2015-10-16 ENCOUNTER — Ambulatory Visit (INDEPENDENT_AMBULATORY_CARE_PROVIDER_SITE_OTHER): Payer: PPO | Admitting: Family Medicine

## 2015-10-16 VITALS — BP 122/68 | HR 68 | Temp 97.8°F | Wt 239.9 lb

## 2015-10-16 DIAGNOSIS — Z87891 Personal history of nicotine dependence: Secondary | ICD-10-CM | POA: Diagnosis not present

## 2015-10-16 DIAGNOSIS — J069 Acute upper respiratory infection, unspecified: Secondary | ICD-10-CM | POA: Diagnosis not present

## 2015-10-16 MED ORDER — TRAMADOL HCL 50 MG PO TABS: 50.0000 mg | ORAL_TABLET | Freq: Four times a day (QID) | ORAL | Status: DC | PRN

## 2015-10-16 NOTE — Progress Notes (Signed)
   Subjective:    [name], male    DOB: [birth year], 75 y.o.   MRN: [id number]  HPI Chief Complaint  Patient presents with  . cough    cough-coughing up clear mucous  . med    wants tramadol refill- gets it for pain in chest.   He is here with complaints of nasal congestion, watery eyes, rhinorrhea, cough productive of clear sputum for approximately 1 week.  Denies fever, chills, headache, earache, sore throat, chest pain, DOE, PND, orthopnea, abdominal pain, back pain, GI or GU symptoms.   No recent antibiotic use. Denies history of bronchitis or pneumonia. Denies asthma or COPD history. Former smoker.    He is requesting that I refill his Tramadol. He has been taking this PRN for atypical chest pain. Dr. Delfina Redwood his former PCP has been prescribing this for him in the past. He has a prescription bottle with him today. States he does not take this often, maybe 1-2 tablets per week. States he has been taking this for several months.   Review of the chart shows that he was evaluated in the emergency department for atypical chest pain approximately 2 weeks ago. Symptoms did not appear to be cardiac related and he has not had any issues since then. He was told to follow-up with his cardiologist however he has not made an appointment at this time.   Review of Systems Pertinent positives and negatives in the history of present illness.     Objective:   Physical Exam BP 122/68 mmHg  Pulse 68  Temp(Src) 97.8 F (36.6 C) (Oral)  Wt 239 lb 14.4 oz (108.818 kg)  Alert and in no distress. No sinus tenderness. Nares patent, erythematous and clear drainage bilaterally. Tympanic membranes and canals are normal. Pharyngeal area is mildly erythematous without exudate. Neck is supple without adenopathy or thyromegaly. Cardiac exam shows a regular sinus rhythm without murmurs or gallops. Lungs are clear to auscultation. Extremities without edema.      Assessment & Plan:  Upper  respiratory infection with cough and congestion  History of tobacco use disorder  Discussed that his symptoms appear to be related to a viral etiology. Recommend symptomatic treatment and giving it more time. Samples of Zyrtec #5 given and Rhinocort nasal spray. Recommend staying well hydrated. He will let me know if he is not improving in the next 3-4 days. Reviewed patient's medical record from previous PCP and he was receiving tramadol for costochondritis and atypical chest pain. I will refill this today. He takes this PRN.  Due to his recent ED visit for atypical chest pain, the patient was encouraged by the ED provider to schedule a follow-up appointment with his cardiologist however he did not do so. Gabriel Cirri, CMA, scheduled patient an appointment with his cardiologist's office on July 10 at 9:15 AM with physician assistant Richardson Dopp. Patient is aware he will be seen a PA.

## 2015-10-16 NOTE — Patient Instructions (Signed)
Stay well hydrated. Try the Zyrtec samples for nasal drainage and Rhinocort sample for nasal congestion/drainage.  Let me know if you are not improving in the next 3-4 days as a cold virus generally takes 7-10 days to run it's course.  If you develop fever or worsening cough or other symptoms then call and let me know.   Upper Respiratory Infection, Adult Most upper respiratory infections (URIs) are a viral infection of the air passages leading to the lungs. A URI affects the nose, throat, and upper air passages. The most common type of URI is nasopharyngitis and is typically referred to as "the common cold." URIs run their course and usually go away on their own. Most of the time, a URI does not require medical attention, but sometimes a bacterial infection in the upper airways can follow a viral infection. This is called a secondary infection. Sinus and middle ear infections are common types of secondary upper respiratory infections. Bacterial pneumonia can also complicate a URI. A URI can worsen asthma and chronic obstructive pulmonary disease (COPD). Sometimes, these complications can require emergency medical care and may be life threatening.  CAUSES Almost all URIs are caused by viruses. A virus is a type of germ and can spread from one person to another.  RISKS FACTORS You may be at risk for a URI if:   You smoke.   You have chronic heart or lung disease.  You have a weakened defense (immune) system.   You are very young or very old.   You have nasal allergies or asthma.  You work in crowded or poorly ventilated areas.  You work in health care facilities or schools. SIGNS AND SYMPTOMS  Symptoms typically develop 2-3 days after you come in contact with a cold virus. Most viral URIs last 7-10 days. However, viral URIs from the influenza virus (flu virus) can last 14-18 days and are typically more severe. Symptoms may include:   Runny or stuffy (congested) nose.   Sneezing.    Cough.   Sore throat.   Headache.   Fatigue.   Fever.   Loss of appetite.   Pain in your forehead, behind your eyes, and over your cheekbones (sinus pain).  Muscle aches.  DIAGNOSIS  Your health care provider may diagnose a URI by:  Physical exam.  Tests to check that your symptoms are not due to another condition such as:  Strep throat.  Sinusitis.  Pneumonia.  Asthma. TREATMENT  A URI goes away on its own with time. It cannot be cured with medicines, but medicines may be prescribed or recommended to relieve symptoms. Medicines may help:  Reduce your fever.  Reduce your cough.  Relieve nasal congestion. HOME CARE INSTRUCTIONS   Take medicines only as directed by your health care provider.   Gargle warm saltwater or take cough drops to comfort your throat as directed by your health care provider.  Use a warm mist humidifier or inhale steam from a shower to increase air moisture. This may make it easier to breathe.  Drink enough fluid to keep your urine clear or pale yellow.   Eat soups and other clear broths and maintain good nutrition.   Rest as needed.   Return to work when your temperature has returned to normal or as your health care provider advises. You may need to stay home longer to avoid infecting others. You can also use a face mask and careful hand washing to prevent spread of the virus.  Increase the  usage of your inhaler if you have asthma.   Do not use any tobacco products, including cigarettes, chewing tobacco, or electronic cigarettes. If you need help quitting, ask your health care provider. PREVENTION  The best way to protect yourself from getting a cold is to practice good hygiene.   Avoid oral or hand contact with people with cold symptoms.   Wash your hands often if contact occurs.  There is no clear evidence that vitamin C, vitamin E, echinacea, or exercise reduces the chance of developing a cold. However, it is  always recommended to get plenty of rest, exercise, and practice good nutrition.  SEEK MEDICAL CARE IF:   You are getting worse rather than better.   Your symptoms are not controlled by medicine.   You have chills.  You have worsening shortness of breath.  You have brown or red mucus.  You have yellow or brown nasal discharge.  You have pain in your face, especially when you bend forward.  You have a fever.  You have swollen neck glands.  You have pain while swallowing.  You have white areas in the back of your throat. SEEK IMMEDIATE MEDICAL CARE IF:   You have severe or persistent:  Headache.  Ear pain.  Sinus pain.  Chest pain.  You have chronic lung disease and any of the following:  Wheezing.  Prolonged cough.  Coughing up blood.  A change in your usual mucus.  You have a stiff neck.  You have changes in your:  Vision.  Hearing.  Thinking.  Mood. MAKE SURE YOU:   Understand these instructions.  Will watch your condition.  Will get help right away if you are not doing well or get worse.   This information is not intended to replace advice given to you by your health care provider. Make sure you discuss any questions you have with your health care provider.   Document Released: 09/22/2000 Document Revised: 08/13/2014 Document Reviewed: 07/04/2013 Elsevier Interactive Patient Education [employer].

## 2015-10-17 ENCOUNTER — Telehealth: Payer: Self-pay | Admitting: Family Medicine

## 2015-10-17 NOTE — Telephone Encounter (Signed)
Records received from Dr. Seward Carol.

## 2015-10-19 NOTE — Progress Notes (Signed)
Cardiology Office Note:    Date:  10/19/2015   ID:  KNOXX BAETZ, DOB Jun 09, 1940, MRN [id number]  PCP:  [name], NP  Cardiologist:  Dr. Lauree Chandler   Electrophysiologist:  n/a  Referring MD: Girtha Rm, NP   Chief Complaint  Patient presents with  . Hospitalization Follow-up    ED visit with chest pain    History of Present Illness:     Joel [name] is a 75 y.o. male with a hx of    Past Medical History  Diagnosis Date  . GERD (gastroesophageal reflux disease)   . HLD (hyperlipidemia)   . HTN (hypertension)   . Cough secondary to angiotensin converting enzyme inhibitor (ACE-I)   . Peripheral vascular disease ([place])   . CAD (coronary artery disease)     A. Negative MV 04/2010;  B. Inadequate Stress Echo 05/2011;  C. 05/29/11 Cath: - Med Rx;  D. 05/2012 Cath/PCI: LM nl, LAD 40p, LCX ectatic, OM1 123m, OM2 90p (3.5x16 Veriflex BMS), RCA dominant 40p/d, PDA 50p, EF 55-65%.  . Iron deficiency anemia   . DVT (deep venous thrombosis) ([place])   . Microcytic anemia   . COPD (chronic obstructive pulmonary disease) ([place])   . Angina   . Shortness of breath     "can come on at anytime" (11/09/2012)  . History of blood transfusion     "I've had 2; don't know what it was related to" (11/09/2012)  . Rotator cuff injury     "left arm; never repaired" (11/09/2012)  . Headache(784.0)     Hx: of  . Cancer ([place])     Hx: of prostate  . Diabetes mellitus without complication ([place])   . Atypical chest pain     Past Surgical History  Procedure Laterality Date  . Iliac artery aneurysm repair    . Circumcision    . Pr vein bypass graft,aorto-fem-pop Left 10/01/2009    left below knee popliteal artery to posterior tibial artery  . Cataract extraction w/ intraocular lens implant Right ~ 2008  . Coronary angioplasty with stent placement  2014    "1" (11/09/2012)  . Left heart cath  05-31-12  . Cardiovascular stress test  Aug. 2014  . Shoulder arthroscopy with open  rotator cuff repair and distal clavicle acrominectomy Left 01/12/2013    Procedure: LEFT SHOULDER ARTHROSCOPY WITH DEBRIDEMENT OPEN DISTAL CLAVICLE RESECTION ,acromioplastyAND ROTATOR CUFF REPAIR;  Surgeon: Yvette Rack., MD;  Location: [facility];  Service: Orthopedics;  Laterality: Left;  . Left heart catheterization with coronary angiogram N/A 06/01/2011    Procedure: LEFT HEART CATHETERIZATION WITH CORONARY ANGIOGRAM;  Surgeon: Hillary Bow, MD;  Location[facility] LAB;  Service: Cardiovascular;  Laterality: N/A;  . Left heart catheterization with coronary angiogram N/A 05/31/2012    Procedure: LEFT HEART CATHETERIZATION WITH CORONARY ANGIOGRAM;  Surgeon: Peter M Martinique, MD;  Location: [facility] CATH LAB;  Service: Cardiovascular;  Laterality: N/A;  . Percutaneous coronary stent intervention (pci-s)  05/31/2012    Procedure: PERCUTANEOUS CORONARY STENT INTERVENTION (PCI-S);  Surgeon: Peter M Martinique, MD;  Location: [facility] CATH LAB;  Service: Cardiovascular;;  . Left heart catheterization with coronary angiogram N/A 08/01/2013    Procedure: LEFT HEART CATHETERIZATION WITH CORONARY ANGIOGRAM;  Surgeon: Burnell Blanks, MD;  Location: [facility] CATH LAB;  Service: Cardiovascular;  Laterality: N/A;    Current Medications: Outpatient Prescriptions Prior to Visit  Medication Sig Dispense Refill  . albuterol (PROVENTIL HFA;VENTOLIN HFA) 108 (90 BASE) MCG/ACT inhaler  Inhale 2 puffs into the lungs every 6 (six) hours as needed for wheezing or shortness of breath.    Marland Kitchen amLODipine (NORVASC) 10 MG tablet Take 1 tablet (10 mg total) by mouth daily. 30 tablet 0  . aspirin EC 81 MG tablet Take 81 mg by mouth daily.     . ferrous sulfate 325 (65 FE) MG tablet Take 325 mg by mouth daily with breakfast.    . gabapentin (NEURONTIN) 100 MG capsule Take 100 mg by mouth as needed (for headaches in daytime).     . gabapentin (NEURONTIN) 300 MG capsule Take 300 mg by mouth at bedtime.    Marland Kitchen losartan-hydrochlorothiazide (HYZAAR)  100-25 MG tablet Take 1 tablet by mouth daily. Please call and schedule a six month follow up appointment 90 tablet 1  . metFORMIN (GLUCOPHAGE) 500 MG tablet Take 500 mg by mouth 2 (two) times daily with a meal. Reported on 08/27/2015    . nitroGLYCERIN (NITROSTAT) 0.4 MG SL tablet Place 1 tablet (0.4 mg total) under the tongue every 5 (five) minutes as needed for chest pain. 25 tablet 6  . omeprazole (PRILOSEC) 20 MG capsule Take 20 mg by mouth 2 (two) times daily before a meal.    . pravastatin (PRAVACHOL) 40 MG tablet Take 1 tablet (40 mg total) by mouth daily. (Patient taking differently: Take 40 mg by mouth every evening. ) 30 tablet 11  . tamsulosin (FLOMAX) 0.4 MG CAPS capsule Take 0.4 mg by mouth daily after supper.     . traMADol (ULTRAM) 50 MG tablet Take 1 tablet (50 mg total) by mouth every 6 (six) hours as needed for moderate pain. 30 tablet 0   No facility-administered medications prior to visit.      Allergies:   Atenolol; Imdur; Lisinopril; Topiramate; and Tramadol-acetaminophen   Social History   Social History  . Marital Status: Widowed    Spouse Name: N/A  . Number of Children: N/A  . Years of Education: N/A   Occupational History  . Retired    Social History Main Topics  . Smoking status: Former Smoker -- 1.00 packs/day for 55 years    Types: Cigarettes    Quit date: 05/26/2011  . Smokeless tobacco: Never Used  . Alcohol Use: No  . Drug Use: No  . Sexual Activity: Not on file     Comment: 11/09/2012 "I don't have to answer questions about sex"   Other Topics Concern  . Not on file   Social History Narrative   Single. Retired. 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  Lives in [place] by himself.     Family History:  The patient's family history includes Breast cancer in his mother; COPD in his father; Cancer in his brother, father, and mother; Hyperlipidemia in his father and mother; Hypertension in his brother, daughter, father, mother, sister, and son.   ROS:     Please see the history of present illness.    ROS All other systems reviewed and are negative.   Physical Exam:    VS:  There were no vitals taken for this visit.   Physical Exam  Wt Readings from Last 3 Encounters:  10/16/15 239 lb 14.4 oz (108.818 kg)  10/06/15 241 lb (109.317 kg)  09/29/15 241 lb 12.8 oz (109.68 kg)      Studies/Labs Reviewed:     EKG:  EKG is  ordered today.  The ekg ordered today demonstrates   Recent Labs: 10/06/2015: BUN 9; Creatinine, Ser 1.36*; Hemoglobin 11.1*;  Platelets 159; Potassium 4.0; Sodium 139   Recent Lipid Panel    Component Value Date/Time   CHOL 164 02/06/2013 0741   TRIG 122.0 02/06/2013 0741   HDL 34.30* 02/06/2013 0741   CHOLHDL 5 02/06/2013 0741   VLDL 24.4 02/06/2013 0741   LDLCALC 105* 02/06/2013 0741    Additional studies/ records that were reviewed today include:    LHC 08/01/13 Angiographic Findings: Left main: No obstructive disease.   Left Anterior Descending Artery: Large caliber vessel that courses to the apex. The proximal and mid vessel is ectatic. There is diffuse 30-40% stenosis in the mid vessel. There are mild luminal irregularities in the distal vessel.   Circumflex Artery: Large caliber vessel with two obtuse marginal branches. The first OM branch is small in caliber and is chronically occluded in the mid vessel. The second OM branch is patent with patent stent, minimal restenosis. The distal segment of the OM branch has diffuse plaque.   Right Coronary Artery: Large caliber dominant vessel with proximal Shepherd's crook. The proximal vessel has diffuse 40% stenosis. The mid vessel is ectatic with 40% stenosis. The distal vessel has diffuse 50% stenosis. The PDA has 50% proximal stenosis followed by diffuse plaque.  Left Ventricular Angiogram: LVEF=65%.  Impression: 1. Triple vessel CAD with patent stent in the second OM branch.   2. Moderate non-obstructive disease in the LAD and RCA 3. Normal LV  function Recommendations: Continue medical management of CAD.   Myoview 8/14 Overall Impression:  Normal stress nuclear study.  LV Ejection Fraction: 58%.  LV Wall Motion:  NL LV Function; NL Wall Motion   ASSESSMENT:     No diagnosis found.  PLAN:     In order of problems listed above:  1.    Medication Adjustments/Labs and Tests Ordered: Current medicines are reviewed at length with the patient today.  Concerns regarding medicines are outlined above.  Medication changes, Labs and Tests ordered today are outlined in the Patient Instructions noted below. There are no Patient Instructions on file for this visit. Signed, Richardson Dopp, PA-C  10/19/2015 6:03 AM    [facility] [address], [place], [place]  [address] Phone: [phone]; Fax: [phone]     This encounter was created in error - please disregard.

## 2015-10-20 ENCOUNTER — Encounter: Payer: PPO | Admitting: Physician Assistant

## 2015-10-23 DIAGNOSIS — M542 Cervicalgia: Secondary | ICD-10-CM | POA: Diagnosis not present

## 2015-10-23 DIAGNOSIS — R51 Headache: Secondary | ICD-10-CM | POA: Diagnosis not present

## 2015-10-23 DIAGNOSIS — M791 Myalgia: Secondary | ICD-10-CM | POA: Diagnosis not present

## 2015-10-23 DIAGNOSIS — G518 Other disorders of facial nerve: Secondary | ICD-10-CM | POA: Diagnosis not present

## 2015-11-11 ENCOUNTER — Encounter: Payer: PPO | Admitting: Physician Assistant

## 2015-11-11 ENCOUNTER — Other Ambulatory Visit: Payer: Self-pay | Admitting: *Deleted

## 2015-11-11 DIAGNOSIS — M542 Cervicalgia: Secondary | ICD-10-CM | POA: Diagnosis not present

## 2015-11-11 DIAGNOSIS — M791 Myalgia: Secondary | ICD-10-CM | POA: Diagnosis not present

## 2015-11-11 DIAGNOSIS — I739 Peripheral vascular disease, unspecified: Secondary | ICD-10-CM

## 2015-11-11 DIAGNOSIS — G518 Other disorders of facial nerve: Secondary | ICD-10-CM | POA: Diagnosis not present

## 2015-11-11 DIAGNOSIS — R51 Headache: Secondary | ICD-10-CM | POA: Diagnosis not present

## 2015-11-24 ENCOUNTER — Encounter: Payer: PPO | Admitting: Physician Assistant

## 2015-12-08 ENCOUNTER — Encounter (INDEPENDENT_AMBULATORY_CARE_PROVIDER_SITE_OTHER): Payer: Self-pay

## 2015-12-08 ENCOUNTER — Ambulatory Visit (INDEPENDENT_AMBULATORY_CARE_PROVIDER_SITE_OTHER): Payer: PPO | Admitting: Physician Assistant

## 2015-12-08 ENCOUNTER — Encounter: Payer: Self-pay | Admitting: Physician Assistant

## 2015-12-08 VITALS — BP 136/60 | HR 64 | Ht 71.0 in | Wt 238.6 lb

## 2015-12-08 DIAGNOSIS — I10 Essential (primary) hypertension: Secondary | ICD-10-CM | POA: Diagnosis not present

## 2015-12-08 DIAGNOSIS — E785 Hyperlipidemia, unspecified: Secondary | ICD-10-CM

## 2015-12-08 DIAGNOSIS — I2510 Atherosclerotic heart disease of native coronary artery without angina pectoris: Secondary | ICD-10-CM | POA: Diagnosis not present

## 2015-12-08 MED ORDER — NITROGLYCERIN 0.4 MG SL SUBL: 0.4000 mg | SUBLINGUAL_TABLET | SUBLINGUAL | 2 refills | Status: DC | PRN

## 2015-12-08 NOTE — Patient Instructions (Signed)
Medication Instructions:  Continue current medications  Labwork: None Ordered  Testing/Procedures: None Ordered  Follow-Up: Your physician wants you to follow-up in: 1 Year with Dr Angelena Form. You will receive a reminder letter in the mail two months in advance. If you don't receive a letter, please call our office to schedule the follow-up appointment.   Any Other Special Instructions Will Be Listed Below (If Applicable).   If you need a refill on your cardiac medications before your next appointment, please call your pharmacy.

## 2015-12-08 NOTE — Progress Notes (Signed)
Cardiology Office Note    Date:  12/08/2015   ID:  Joel [name], DOB [birth year], MRN [id number]  PCP:  [name], NP  Cardiologist: Dr. Angelena Form  Chief Complaint  Patient presents with  . Follow-up    History of Present Illness:  Joel [name] is a 75 y.o. male with history of HTN, HLD, CAD, PAD, DM.Cardiac cath February 2014 with moderate LAD and RCA disease, severe OM2 stenosis treated with bare metal stent. Admitted to [facility] 11/09/12 with chest pain. Ruled out for MI. Lexiscan stress myoview 11/16/12 without ischemia. Normal LVEF.  Cardiac cath 08/01/13 with patent stent Circumflex and mild to moderate disease in the LAD and RCA. His PAD is followed in [facility] by Dr. Scot Dock. Seen in the ED by Dr. Ron Parker 12/15/13 with c/o chest pain. Negative troponin and discharged home. He was seen here 03/04/14 and had c/o chest pain after working on his car but quickly resolved. He was seen in the ED 09/10/14 with atypical chest pain. Troponin was negative x 2 and EKG was unchanged.   Last saw Dr.McAlhany 04/2015 with chronic chest pain that did not sound cardiac. Most likely GERD versus musculoskeletal pain. Medical management recommended.  The patient was in the emergency room with chest pain 06/2015 07/2015 and most recently 09/2015 at which time troponin was negative and EKG and chest x-ray unremarkable.  Patient here for regular check up and post ER visit. Overall he is feeling well. He has had no further chest pain, palpitations, dyspnea, dyspnea on exertion, dizziness or presyncope. He states busy all day long working on cars. He does not exercise regularly other than this. He was recently seen at the headache clinic and is feeling much better since starting on gabapentin. Lipids were checked last week at the [facility].       Past Medical History:  Diagnosis Date  . Angina   . Atypical chest pain   . CAD (coronary artery disease)    A. Negative MV 04/2010;  B. Inadequate Stress Echo  05/2011;  C. 05/29/11 Cath: - Med Rx;  D. 05/2012 Cath/PCI: LM nl, LAD 40p, LCX ectatic, OM1 161m, OM2 90p (3.5x16 Veriflex BMS), RCA dominant 40p/d, PDA 50p, EF 55-65%.  . Cancer ([place])    Hx: of prostate  . COPD (chronic obstructive pulmonary disease) ([place])   . Cough secondary to angiotensin converting enzyme inhibitor (ACE-I)   . Diabetes mellitus without complication ([place])   . DVT (deep venous thrombosis) ([place])   . GERD (gastroesophageal reflux disease)   . Headache(784.0)    Hx: of  . History of blood transfusion    "I've had 2; don't know what it was related to" (11/09/2012)  . HLD (hyperlipidemia)   . HTN (hypertension)   . Iron deficiency anemia   . Microcytic anemia   . Peripheral vascular disease ([place])   . Rotator cuff injury    "left arm; never repaired" (11/09/2012)  . Shortness of breath    "can come on at anytime" (11/09/2012)    Past Surgical History:  Procedure Laterality Date  . CARDIOVASCULAR STRESS TEST  Aug. 2014  . CATARACT EXTRACTION W/ INTRAOCULAR LENS IMPLANT Right ~ 2008  . CIRCUMCISION    . CORONARY ANGIOPLASTY WITH STENT PLACEMENT  2014   "1" (11/09/2012)  . ILIAC ARTERY ANEURYSM REPAIR    . LEFT HEART CATH  05-31-12  . LEFT HEART CATHETERIZATION WITH CORONARY ANGIOGRAM N/A 06/01/2011   Procedure: LEFT HEART CATHETERIZATION WITH CORONARY ANGIOGRAM;  Surgeon: Hillary Bow, MD;  Location: [facility] CATH LAB;  Service: Cardiovascular;  Laterality: N/A;  . LEFT HEART CATHETERIZATION WITH CORONARY ANGIOGRAM N/A 05/31/2012   Procedure: LEFT HEART CATHETERIZATION WITH CORONARY ANGIOGRAM;  Surgeon: Peter M Martinique, MD;  Location: [facility] CATH LAB;  Service: Cardiovascular;  Laterality: N/A;  . LEFT HEART CATHETERIZATION WITH CORONARY ANGIOGRAM N/A 08/01/2013   Procedure: LEFT HEART CATHETERIZATION WITH CORONARY ANGIOGRAM;  Surgeon: Burnell Blanks, MD;  Location: [facility] CATH LAB;  Service: Cardiovascular;  Laterality: N/A;  . PERCUTANEOUS CORONARY STENT INTERVENTION (PCI-S)   05/31/2012   Procedure: PERCUTANEOUS CORONARY STENT INTERVENTION (PCI-S);  Surgeon: Peter M Martinique, MD;  Location: [facility] CATH LAB;  Service: Cardiovascular;;  . PR VEIN BYPASS GRAFT,AORTO-FEM-POP Left 10/01/2009   left below knee popliteal artery to posterior tibial artery  . SHOULDER ARTHROSCOPY WITH OPEN ROTATOR CUFF REPAIR AND DISTAL CLAVICLE ACROMINECTOMY Left 01/12/2013   Procedure: LEFT SHOULDER ARTHROSCOPY WITH DEBRIDEMENT OPEN DISTAL CLAVICLE RESECTION ,acromioplastyAND ROTATOR CUFF REPAIR;  Surgeon: Yvette Rack., MD;  Location: [place];  Service: Orthopedics;  Laterality: Left;    Current Medications: Outpatient Medications Prior to Visit  Medication Sig Dispense Refill  . albuterol (PROVENTIL HFA;VENTOLIN HFA) 108 (90 BASE) MCG/ACT inhaler Inhale 2 puffs into the lungs every 6 (six) hours as needed for wheezing or shortness of breath.    Marland Kitchen amLODipine (NORVASC) 10 MG tablet Take 1 tablet (10 mg total) by mouth daily. 30 tablet 0  . aspirin EC 81 MG tablet Take 81 mg by mouth daily.     . ferrous sulfate 325 (65 FE) MG tablet Take 325 mg by mouth daily with breakfast.    . gabapentin (NEURONTIN) 100 MG capsule Take 100 mg by mouth as needed (for headaches in daytime).     . gabapentin (NEURONTIN) 300 MG capsule Take 300 mg by mouth at bedtime.    Marland Kitchen losartan-hydrochlorothiazide (HYZAAR) 100-25 MG tablet Take 1 tablet by mouth daily. Please call and schedule a six month follow up appointment 90 tablet 1  . metFORMIN (GLUCOPHAGE) 500 MG tablet Take 500 mg by mouth 2 (two) times daily with a meal. Reported on 08/27/2015    . omeprazole (PRILOSEC) 20 MG capsule Take 20 mg by mouth 2 (two) times daily before a meal.    . pravastatin (PRAVACHOL) 40 MG tablet Take 40 mg by mouth every evening.    . tamsulosin (FLOMAX) 0.4 MG CAPS capsule Take 0.4 mg by mouth daily after supper.     . traMADol (ULTRAM) 50 MG tablet Take 1 tablet (50 mg total) by mouth every 6 (six) hours as needed for moderate pain.  30 tablet 0  . nitroGLYCERIN (NITROSTAT) 0.4 MG SL tablet Place 1 tablet (0.4 mg total) under the tongue every 5 (five) minutes as needed for chest pain. 25 tablet 6  . pravastatin (PRAVACHOL) 40 MG tablet Take 1 tablet (40 mg total) by mouth daily. (Patient taking differently: Take 40 mg by mouth every evening. ) 30 tablet 11   No facility-administered medications prior to visit.      Allergies:   Atenolol; Imdur [isosorbide mononitrate]; Lisinopril; Topiramate; and Tramadol-acetaminophen   Social History   Social History  . Marital status: Widowed    Spouse name: N/A  . Number of children: N/A  . Years of education: N/A   Occupational History  . Retired    Social History Main Topics  . Smoking status: Former Smoker    Packs/day: 1.00  Years: 55.00    Types: Cigarettes    Quit date: 05/26/2011  . Smokeless tobacco: Never Used  . Alcohol use No  . Drug use: No  . Sexual activity: Not Asked     Comment: 11/09/2012 "I don't have to answer questions about sex"   Other Topics Concern  . None   Social History Narrative   Single. Retired. 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  Lives in [place] by himself.     Family History:  The patient's   family history includes Breast cancer in his mother; COPD in his father; Cancer in his brother, father, and mother; Hyperlipidemia in his father and mother; Hypertension in his brother, daughter, father, mother, sister, and son.   ROS:   Please see the history of present illness.    Review of Systems  Constitution: Negative.  HENT: Positive for headaches.   Cardiovascular: Negative.   Respiratory: Positive for cough.   Endocrine: Negative.   Hematologic/Lymphatic: Negative.   Musculoskeletal: Positive for back pain.  Gastrointestinal: Negative.   Genitourinary: Negative.    All other systems reviewed and are negative.   PHYSICAL EXAM:   VS:  BP 136/60   Pulse 64   Ht 5\' 11"  (1.803 m)   Wt 238 lb 9.6 oz (108.2 kg)   SpO2 97%   BMI  33.28 kg/m   Physical Exam  GEN: Well nourished, well developed, in no acute distress  Neck: no JVD, carotid bruits, or masses Cardiac:RRR; positive S4 no murmurs, rubs  Respiratory:  clear to auscultation bilaterally, normal work of breathing GI: soft, nontender, nondistended, + BS Ext: without cyanosis, clubbing, or edema, Good distal pulses bilaterally MS: no deformity or atrophy  Skin: warm and dry, no rash Psych: euthymic mood, full affect  Wt Readings from Last 3 Encounters:  12/08/15 238 lb 9.6 oz (108.2 kg)  10/16/15 239 lb 14.4 oz (108.8 kg)  10/06/15 241 lb (109.3 kg)      Studies/Labs Reviewed:   EKG:  EKG is  ordered today.  The ekg ordered today demonstrates Sinus bradycardia with first-degree AV block, moderate LVH, no acute change  Recent Labs: 10/06/2015: BUN 9; Creatinine, Ser 1.36; Hemoglobin 11.1; Platelets 159; Potassium 4.0; Sodium 139   Lipid Panel    Component Value Date/Time   CHOL 164 02/06/2013 0741   TRIG 122.0 02/06/2013 0741   HDL 34.30 (L) 02/06/2013 0741   CHOLHDL 5 02/06/2013 0741   VLDL 24.4 02/06/2013 0741   LDLCALC 105 (H) 02/06/2013 0741    Additional studies/ records that were reviewed today include:     Cath 07/2013 Angiographic Findings:   Left main: No obstructive disease.     Left Anterior Descending Artery: Large caliber vessel that courses to the apex. The proximal and mid vessel is ectatic. There is diffuse 30-40% stenosis in the mid vessel. There are mild luminal irregularities in the distal vessel.     Circumflex Artery: Large caliber vessel with two obtuse marginal branches. The first OM branch is small in caliber and is chronically occluded in the mid vessel. The second OM branch is patent with patent stent, minimal restenosis. The distal segment of the OM branch has diffuse plaque.     Right Coronary Artery: Large caliber dominant vessel with proximal Shepherd's crook. The proximal vessel has diffuse 40% stenosis. The  mid vessel is ectatic with 40% stenosis. The distal vessel has diffuse 50% stenosis. The PDA has 50% proximal stenosis followed by diffuse plaque.    Left  Ventricular Angiogram: LVEF=65%.    Impression: 1. Triple vessel CAD with patent stent in the second OM branch.   2. Moderate non-obstructive disease in the LAD and RCA 3. Normal LV function   Recommendations: Continue medical management of CAD.                ASSESSMENT:    1. Essential hypertension   2. Coronary artery disease involving native coronary artery of native heart without angina pectoris   3. Hyperlipidemia      PLAN:  In order of problems listed above:  Essential hypertension well controlled on amlodipine and Hyzaar  CAD as listed above. Also has chronic atypical chest pain last ER visit 09/2015. No pain since then. Continue to follow. Follow-up with Dr.McAlhany in one year  Hyperlipidemia on Pravachol. [facility].    Medication Adjustments/Labs and Tests Ordered: Current medicines are reviewed at length with the patient today.  Concerns regarding medicines are outlined above.  Medication changes, Labs and Tests ordered today are listed in the Patient Instructions below. Patient Instructions  Medication Instructions:  Continue current medications  Labwork: None Ordered  Testing/Procedures: None Ordered  Follow-Up: Your physician wants you to follow-up in: 1 Year with Dr Angelena Form. You will receive a reminder letter in the mail two months in advance. If you don't receive a letter, please call our office to schedule the follow-up appointment.   Any Other Special Instructions Will Be Listed Below (If Applicable).   If you need a refill on your cardiac medications before your next appointment, please call your pharmacy.      Signed, Ermalinda Barrios, PA-C  12/08/2015 1:18 PM    [facility] [address], [place], [place]  [address] Phone: [phone]; Fax: [phone]

## 2015-12-18 ENCOUNTER — Ambulatory Visit (INDEPENDENT_AMBULATORY_CARE_PROVIDER_SITE_OTHER): Payer: PPO | Admitting: Family Medicine

## 2015-12-18 ENCOUNTER — Encounter: Payer: Self-pay | Admitting: Family Medicine

## 2015-12-18 VITALS — BP 110/58 | HR 67 | Temp 97.7°F | Resp 16 | Wt 238.4 lb

## 2015-12-18 DIAGNOSIS — R7303 Prediabetes: Secondary | ICD-10-CM

## 2015-12-18 DIAGNOSIS — E860 Dehydration: Secondary | ICD-10-CM

## 2015-12-18 DIAGNOSIS — R42 Dizziness and giddiness: Secondary | ICD-10-CM | POA: Diagnosis not present

## 2015-12-18 DIAGNOSIS — R531 Weakness: Secondary | ICD-10-CM | POA: Diagnosis not present

## 2015-12-18 LAB — CBC WITH DIFFERENTIAL/PLATELET
Basophils Absolute: 42 cells/uL (ref 0–200)
Basophils Relative: 1 %
Eosinophils Absolute: 336 cells/uL (ref 15–500)
Eosinophils Relative: 8 %
HCT: 35.5 % — ABNORMAL LOW (ref 38.5–50.0)
Hemoglobin: 11.4 g/dL — ABNORMAL LOW (ref 13.2–17.1)
Lymphocytes Relative: 28 %
Lymphs Abs: 1176 cells/uL (ref 850–3900)
MCH: 21.8 pg — ABNORMAL LOW (ref 27.0–33.0)
MCHC: 32.1 g/dL (ref 32.0–36.0)
MCV: 67.7 fL — ABNORMAL LOW (ref 80.0–100.0)
MPV: 8.8 fL (ref 7.5–12.5)
Monocytes Absolute: 336 cells/uL (ref 200–950)
Monocytes Relative: 8 %
Neutro Abs: 2310 cells/uL (ref 1500–7800)
Neutrophils Relative %: 55 %
Platelets: 155 10*3/uL (ref 140–400)
RBC: 5.24 MIL/uL (ref 4.20–5.80)
RDW: 16.9 % — ABNORMAL HIGH (ref 11.0–15.0)
WBC: 4.2 10*3/uL (ref 4.0–10.5)

## 2015-12-18 LAB — COMPREHENSIVE METABOLIC PANEL
ALT: 14 U/L (ref 9–46)
AST: 15 U/L (ref 10–35)
Albumin: 4.1 g/dL (ref 3.6–5.1)
Alkaline Phosphatase: 69 U/L (ref 40–115)
BUN: 14 mg/dL (ref 7–25)
CO2: 28 mmol/L (ref 20–31)
Calcium: 9.1 mg/dL (ref 8.6–10.3)
Chloride: 103 mmol/L (ref 98–110)
Creat: 1.2 mg/dL — ABNORMAL HIGH (ref 0.70–1.18)
Glucose, Bld: 79 mg/dL (ref 65–99)
Potassium: 4.1 mmol/L (ref 3.5–5.3)
Sodium: 139 mmol/L (ref 135–146)
Total Bilirubin: 0.3 mg/dL (ref 0.2–1.2)
Total Protein: 6.9 g/dL (ref 6.1–8.1)

## 2015-12-18 LAB — GLUCOSE, POCT (MANUAL RESULT ENTRY): POC Glucose: 126 mg/dl — AB (ref 70–99)

## 2015-12-18 LAB — TSH: TSH: 3.53 mIU/L (ref 0.40–4.50)

## 2015-12-18 NOTE — Progress Notes (Signed)
Subjective:    [name], male    DOB: 1940-09-04, 75 y.o.   MRN: [id number]  HPI Chief Complaint  Patient presents with  . Other    dizzy spells since sunday, weak, tired   He is here with complaints of dizziness for the past 3 days that was preceded by a 2 day history of nausea and vomiting. States GI symptoms have resolved. Describes dizziness as an unsteady sensation, occurs intermittently when changing positions and last for 2-3 minutes. States sitting down makes the dizziness goes away.  He reports having approximately 2 dizzy episodes daily.  He denies dizziness currently but he did have one episode this morning after getting out of bed. He has not fallen.   Associated symptoms include generalized weakness and fatigue since the GI illness. Reports he drinks coffee and diet Coke and rarely drinks water.   Denies fever, chills, headache, blurred or double vision, nasal congestion, rhinorrhea, tinnitus, sore throat, chest pain, palpitations, shortness of breath, abdominal pain, diarrhea, urinary symptoms, or LE edema. No numbness, tingling or weakness. No blood in stool.  He denies drinking alcohol.  He is seeing a headache specialist for chronic headache.   He was at his cardiologist on 12/08/2015 for follow-up for chronic chest pain. No changes to his medications were made at that time. He is scheduled to follow-up in one year.    Past Medical History:  Diagnosis Date  . Angina   . Atypical chest pain   . CAD (coronary artery disease)    A. Negative MV 04/2010;  B. Inadequate Stress Echo 05/2011;  C. 05/29/11 Cath: - Med Rx;  D. 05/2012 Cath/PCI: LM nl, LAD 40p, LCX ectatic, OM1 142m, OM2 90p (3.5x16 Veriflex BMS), RCA dominant 40p/d, PDA 50p, EF 55-65%.  . Cancer ([place])    Hx: of prostate  . COPD (chronic obstructive pulmonary disease) ([place])   . Cough secondary to angiotensin converting enzyme inhibitor (ACE-I)   . Diabetes mellitus without complication  ([place])   . DVT (deep venous thrombosis) ([place])   . GERD (gastroesophageal reflux disease)   . Headache(784.0)    Hx: of  . History of blood transfusion    "I've had 2; don't know what it was related to" (11/09/2012)  . HLD (hyperlipidemia)   . HTN (hypertension)   . Iron deficiency anemia   . Microcytic anemia   . Peripheral vascular disease ([place])   . Rotator cuff injury    "left arm; never repaired" (11/09/2012)  . Shortness of breath    "can come on at anytime" (11/09/2012)   Past Surgical History:  Procedure Laterality Date  . CARDIOVASCULAR STRESS TEST  Aug. 2014  . CATARACT EXTRACTION W/ INTRAOCULAR LENS IMPLANT Right ~ 2008  . CIRCUMCISION    . CORONARY ANGIOPLASTY WITH STENT PLACEMENT  2014   "1" (11/09/2012)  . ILIAC ARTERY ANEURYSM REPAIR    . LEFT HEART CATH  05-31-12  . LEFT HEART CATHETERIZATION WITH CORONARY ANGIOGRAM N/A 06/01/2011   Procedure: LEFT HEART CATHETERIZATION WITH CORONARY ANGIOGRAM;  Surgeon: Hillary Bow, MD;  Location: [facility] CATH LAB;  Service: Cardiovascular;  Laterality: N/A;  . LEFT HEART CATHETERIZATION WITH CORONARY ANGIOGRAM N/A 05/31/2012   Procedure: LEFT HEART CATHETERIZATION WITH CORONARY ANGIOGRAM;  Surgeon: Peter M Martinique, MD;  Location: [facility] C[facility] LAB;  Service: Cardiovascular;  Laterality: N/A;  . LEFT HEART CATHETERIZATION WITH CORONARY ANGIOGRAM N/A 08/01/2013   Procedure: LEFT HEART CATHETERIZATION WITH CORONARY ANGIOGRAM;  Surgeon: Harrell Gave  Santina Evans, MD;  Location: [place] CATH LAB;  Service: Cardiovascular;  Laterality: N/A;  . PERCUTANEOUS CORONARY STENT INTERVENTION (PCI-S)  05/31/2012   Procedure: PERCUTANEOUS CORONARY STENT INTERVENTION (PCI-S);  Surgeon: Peter M Martinique, MD;  Location: [facility] CATH LAB;  Service: Cardiovascular;;  . PR VEIN BYPASS GRAFT,AORTO-FEM-POP Left 10/01/2009   left below knee popliteal artery to posterior tibial artery  . SHOULDER ARTHROSCOPY WITH OPEN ROTATOR CUFF REPAIR AND DISTAL CLAVICLE ACROMINECTOMY Left 01/12/2013    Procedure: LEFT SHOULDER ARTHROSCOPY WITH DEBRIDEMENT OPEN DISTAL CLAVICLE RESECTION ,acromioplastyAND ROTATOR CUFF REPAIR;  Surgeon: Yvette Rack., MD;  Location: [place];  Service: Orthopedics;  Laterality: Left;   Current Outpatient Prescriptions on File Prior to Visit  Medication Sig Dispense Refill  . albuterol (PROVENTIL HFA;VENTOLIN HFA) 108 (90 BASE) MCG/ACT inhaler Inhale 2 puffs into the lungs every 6 (six) hours as needed for wheezing or shortness of breath.    Marland Kitchen amLODipine (NORVASC) 10 MG tablet Take 1 tablet (10 mg total) by mouth daily. 30 tablet 0  . aspirin EC 81 MG tablet Take 81 mg by mouth daily.     . ferrous sulfate 325 (65 FE) MG tablet Take 325 mg by mouth daily with breakfast.    . gabapentin (NEURONTIN) 100 MG capsule Take 100 mg by mouth as needed (for headaches in daytime).     . gabapentin (NEURONTIN) 300 MG capsule Take 300 mg by mouth at bedtime.    Marland Kitchen losartan-hydrochlorothiazide (HYZAAR) 100-25 MG tablet Take 1 tablet by mouth daily. Please call and schedule a six month follow up appointment 90 tablet 1  . metFORMIN (GLUCOPHAGE) 500 MG tablet Take 500 mg by mouth 2 (two) times daily with a meal. Reported on 08/27/2015    . nitroGLYCERIN (NITROSTAT) 0.4 MG SL tablet Place 1 tablet (0.4 mg total) under the tongue every 5 (five) minutes as needed for chest pain. 25 tablet 2  . omeprazole (PRILOSEC) 20 MG capsule Take 20 mg by mouth 2 (two) times daily before a meal.    . pravastatin (PRAVACHOL) 40 MG tablet Take 40 mg by mouth every evening.    . tamsulosin (FLOMAX) 0.4 MG CAPS capsule Take 0.4 mg by mouth daily after supper.     . traMADol (ULTRAM) 50 MG tablet Take 1 tablet (50 mg total) by mouth every 6 (six) hours as needed for moderate pain. 30 tablet 0   No current facility-administered medications on file prior to visit.    Reviewed allergies, medications, past medical, surgical, family, and social history.    Review of Systems Pertinent positives and  negatives in the history of present illness.     Objective:   Physical Exam  Constitutional: He is oriented to person, place, and time. He appears well-developed and well-nourished.  HENT:  Right Ear: Hearing, tympanic membrane and ear canal normal.  Left Ear: Hearing, tympanic membrane and ear canal normal.  Nose: Nose normal.  Mouth/Throat: Uvula is midline, oropharynx is clear and moist and mucous membranes are normal.  Eyes: Conjunctivae and EOM are normal. Pupils are equal, round, and reactive to light.  Neck: Normal range of motion. Neck supple. No JVD present. No thyromegaly present.  Cardiovascular: Normal rate, regular rhythm, normal heart sounds and normal pulses.   No LE edema  Pulmonary/Chest: Effort normal and breath sounds normal.  Abdominal: Soft. Normal appearance and bowel sounds are normal. There is no hepatosplenomegaly. There is no tenderness. There is no rigidity, no rebound, no guarding, no CVA  tenderness, no tenderness at McBurney's point and negative Murphy's sign.  Lymphadenopathy:    He has no cervical adenopathy.  Neurological: He is alert and oriented to person, place, and time. He has normal strength and normal reflexes. No cranial nerve deficit or sensory deficit. He displays a negative Romberg sign. Coordination and gait normal. GCS eye subscore is 4. GCS verbal subscore is 5. GCS motor subscore is 6.  Skin: Skin is warm and dry. No rash noted. He is not diaphoretic. No cyanosis. No pallor.  Psychiatric: He has a normal mood and affect. His speech is normal and behavior is normal. Judgment and thought content normal. Cognition and memory are normal.   BP (!) 110/58 (BP Location: Right Arm, Patient Position: Standing, Cuff Size: Large)   Pulse 67   Temp 97.7 F (36.5 C) (Oral)   Resp 16   Wt 238 lb 6.4 oz (108.1 kg)   SpO2 98%   BMI 33.25 kg/m   Random glucose finger stick: 126    Assessment & Plan:  Dizziness, nonspecific - Plan: CBC with  Differential/Platelet, Comprehensive metabolic panel, TSH, POCT glucose (manual entry), Hemoglobin A1c  Generalized weakness - Plan: CBC with Differential/Platelet, Comprehensive metabolic panel, TSH, POCT glucose (manual entry), Hemoglobin A1c  Dehydration - Plan: CBC with Differential/Platelet  Prediabetes - Plan: Hemoglobin A1c  Discussed that his symptoms appear to be related to possible dehydration as he is borderline postural. Suspect this is due to recent nausea and vomiting and lack of rehydration. I commend that he change position slowly, drink more water and gauge this by a light yellow color to his urine.  Labs ordered to check electrolytes.  Plan to check hemoglobin A1c since he is still taking metformin that was prescribed by his previous PCP for prediabetes. He does not check his blood sugars and does not have a meter to do so. His neurological exam is normal and no focal weakness detected. He will follow-up next week for blood pressure check.

## 2015-12-18 NOTE — Patient Instructions (Addendum)
Change positions slowly for the next few days. Drink some extra water but don't go overboard. Your urine should be a light yellow but not clear.  I think your dizziness is related to being dehydrated from your vomiting last weekend.  Come back for a nurse visit next Monday to have your blood pressure checked.    Dizziness Dizziness is a common problem. It is a feeling of unsteadiness or light-headedness. You may feel like you are about to faint. Dizziness can lead to injury if you stumble or fall. Anyone can become dizzy, but dizziness is more common in older adults. This condition can be caused by a number of things, including medicines, dehydration, or illness. HOME CARE INSTRUCTIONS Taking these steps may help with your condition: Eating and Drinking  Drink enough fluid to keep your urine clear or pale yellow. This helps to keep you from becoming dehydrated. Try to drink more clear fluids, such as water.  Do not drink alcohol.  Limit your caffeine intake if directed by your health care provider.  Limit your salt intake if directed by your health care provider. Activity  Avoid making quick movements.  Rise slowly from chairs and steady yourself until you feel okay.  In the morning, first sit up on the side of the bed. When you feel okay, stand slowly while you hold onto something until you know that your balance is fine.  Move your legs often if you need to stand in one place for a long time. Tighten and relax your muscles in your legs while you are standing.  Do not drive or operate heavy machinery if you feel dizzy.  Avoid bending down if you feel dizzy. Place items in your home so that they are easy for you to reach without leaning over. Lifestyle  Do not use any tobacco products, including cigarettes, chewing tobacco, or electronic cigarettes. If you need help quitting, ask your health care provider.  Try to reduce your stress level, such as with yoga or meditation. Talk with  your health care provider if you need help. General Instructions  Watch your dizziness for any changes.  Take medicines only as directed by your health care provider. Talk with your health care provider if you think that your dizziness is caused by a medicine that you are taking.  Tell a friend or a family member that you are feeling dizzy. If he or she notices any changes in your behavior, have this person call your health care provider.  Keep all follow-up visits as directed by your health care provider. This is important. SEEK MEDICAL CARE IF:  Your dizziness does not go away.  Your dizziness or light-headedness gets worse.  You feel nauseous.  You have reduced hearing.  You have new symptoms.  You are unsteady on your feet or you feel like the room is spinning. SEEK IMMEDIATE MEDICAL CARE IF:  You vomit or have diarrhea and are unable to eat or drink anything.  You have problems talking, walking, swallowing, or using your arms, hands, or legs.  You feel generally weak.  You are not thinking clearly or you have trouble forming sentences. It may take a friend or family member to notice this.  You have chest pain, abdominal pain, shortness of breath, or sweating.  Your vision changes.  You notice any bleeding.  You have a headache.  You have neck pain or a stiff neck.  You have a fever.   This information is not intended to replace  advice given to you by your health care provider. Make sure you discuss any questions you have with your health care provider.   Document Released: 09/22/2000 Document Revised: 08/13/2014 Document Reviewed: 03/25/2014 Elsevier Interactive Patient Education [employer].

## 2015-12-19 LAB — HEMOGLOBIN A1C
Hgb A1c MFr Bld: 6 % — ABNORMAL HIGH (ref <5.7)
Mean Plasma Glucose: 126 mg/dL

## 2015-12-22 ENCOUNTER — Other Ambulatory Visit: Payer: PPO

## 2015-12-22 DIAGNOSIS — R51 Headache: Secondary | ICD-10-CM | POA: Diagnosis not present

## 2015-12-22 DIAGNOSIS — M542 Cervicalgia: Secondary | ICD-10-CM | POA: Diagnosis not present

## 2015-12-22 DIAGNOSIS — M791 Myalgia: Secondary | ICD-10-CM | POA: Diagnosis not present

## 2015-12-22 DIAGNOSIS — G518 Other disorders of facial nerve: Secondary | ICD-10-CM | POA: Diagnosis not present

## 2015-12-22 NOTE — Progress Notes (Signed)
Patient came in today to have a B/P check 150 /80

## 2016-01-02 ENCOUNTER — Encounter: Payer: Self-pay | Admitting: Family Medicine

## 2016-01-02 ENCOUNTER — Ambulatory Visit (INDEPENDENT_AMBULATORY_CARE_PROVIDER_SITE_OTHER): Payer: PPO | Admitting: Family Medicine

## 2016-01-02 VITALS — BP 140/88 | HR 72 | Temp 98.1°F | Wt 238.0 lb

## 2016-01-02 DIAGNOSIS — J069 Acute upper respiratory infection, unspecified: Secondary | ICD-10-CM

## 2016-01-02 DIAGNOSIS — R05 Cough: Secondary | ICD-10-CM | POA: Diagnosis not present

## 2016-01-02 DIAGNOSIS — R059 Cough, unspecified: Secondary | ICD-10-CM

## 2016-01-02 MED ORDER — BENZONATATE 200 MG PO CAPS: 200.0000 mg | ORAL_CAPSULE | Freq: Two times a day (BID) | ORAL | 0 refills | Status: DC | PRN

## 2016-01-02 NOTE — Progress Notes (Signed)
Subjective:  Chief Complaint  Patient presents with  . Cough    coughing in the morning. coughing up clear     Joel [name] is a 75 y.o. male who presents for cough for past week. States this morning his cough got worse and now coughing up whitish clear mucous. Now he complains of nasal congestion, rhinorrhea, scratchy throat.  Denies history of allergies, bronchitis, pneumonia, asthma, copd.  Former smoker.  States he had a GI issue last week and vomited a couple of times. This resolved.  Denies fever, chills, headache, sinus pressure, ear pain, chest pain, palpitations, DOE, orthopnea, shortness of breath, abdominal pain, back pain, GI or GU symptoms.    Treatment to date: cough suppressants.  Denies sick contacts.  No other aggravating or relieving factors.  No other c/o.  ROS as in subjective.   Objective: Vitals:   01/02/16 1340  BP: 140/88  Pulse: 72  Temp: 98.1 F (36.7 C)    General appearance: Alert, WD/WN, no distress, mildly ill appearing                             Skin: warm, no rash                           Head: no sinus tenderness                            Eyes: conjunctiva normal, corneas clear, PERRLA                            Ears: pearly TMs, external ear canals normal                          Nose: septum midline, turbinates swollen, with erythema and clear discharge             Mouth/throat: MMM, tongue normal, mild pharyngeal erythema                           Neck: supple, no adenopathy, no thyromegaly, nontender                          Heart: RRR, normal S1, S2, no murmurs                         Lungs: CTA bilaterally, no wheezes, rales, or rhonchi     Assessment: Acute URI  Cough - Plan: benzonatate (TESSALON) 200 MG capsule    Plan: Discussed diagnosis and treatment of URI.  Suggested symptomatic OTC remedies. Stay well hydrated and try claritin or allegra for post nasal drainage. Salt water gargles for throat discomfort. Tessalon  sent to pharmacy.  Nasal saline spray for congestion. Sample of Dymista given to patient.  Tylenol or Ibuprofen OTC for fever and malaise.  Call/return in 2-3 days if symptoms aren't resolving.

## 2016-01-02 NOTE — Patient Instructions (Addendum)
Try the nasal spray one spray both nostrils twice daily.  You can try claritin or allegra or their generic versions for post nasal drainage.  Salt water gargles for the next 2-3 days. 1/2 teaspoon of salt in 6 ounces of water.  You can also try Mucinex DM or Robitussin for cough.  If that is not helping you can pick up the [facility] prescription. If you are not feeling better by Monday or Tuesday call and you may need to start on an antibiotic.  Stay well hydrated.   Upper Respiratory Infection, Adult Most upper respiratory infections (URIs) are a viral infection of the air passages leading to the lungs. A URI affects the nose, throat, and upper air passages. The most common type of URI is nasopharyngitis and is typically referred to as "the common cold." URIs run their course and usually go away on their own. Most of the time, a URI does not require medical attention, but sometimes a bacterial infection in the upper airways can follow a viral infection. This is called a secondary infection. Sinus and middle ear infections are common types of secondary upper respiratory infections. Bacterial pneumonia can also complicate a URI. A URI can worsen asthma and chronic obstructive pulmonary disease (COPD). Sometimes, these complications can require emergency medical care and may be life threatening.  CAUSES Almost all URIs are caused by viruses. A virus is a type of germ and can spread from one person to another.  RISKS FACTORS You may be at risk for a URI if:   You smoke.   You have chronic heart or lung disease.  You have a weakened defense (immune) system.   You are very young or very old.   You have nasal allergies or asthma.  You work in crowded or poorly ventilated areas.  You work in health care facilities or schools. SIGNS AND SYMPTOMS  Symptoms typically develop 2-3 days after you come in contact with a cold virus. Most viral URIs last 7-10 days. However, viral URIs from the  influenza virus (flu virus) can last 14-18 days and are typically more severe. Symptoms may include:   Runny or stuffy (congested) nose.   Sneezing.   Cough.   Sore throat.   Headache.   Fatigue.   Fever.   Loss of appetite.   Pain in your forehead, behind your eyes, and over your cheekbones (sinus pain).  Muscle aches.  DIAGNOSIS  Your health care provider may diagnose a URI by:  Physical exam.  Tests to check that your symptoms are not due to another condition such as:  Strep throat.  Sinusitis.  Pneumonia.  Asthma. TREATMENT  A URI goes away on its own with time. It cannot be cured with medicines, but medicines may be prescribed or recommended to relieve symptoms. Medicines may help:  Reduce your fever.  Reduce your cough.  Relieve nasal congestion. HOME CARE INSTRUCTIONS   Take medicines only as directed by your health care provider.   Gargle warm saltwater or take cough drops to comfort your throat as directed by your health care provider.  Use a warm mist humidifier or inhale steam from a shower to increase air moisture. This may make it easier to breathe.  Drink enough fluid to keep your urine clear or pale yellow.   Eat soups and other clear broths and maintain good nutrition.   Rest as needed.   Return to work when your temperature has returned to normal or as your health care provider  advises. You may need to stay home longer to avoid infecting others. You can also use a face mask and careful hand washing to prevent spread of the virus.  Increase the usage of your inhaler if you have asthma.   Do not use any tobacco products, including cigarettes, chewing tobacco, or electronic cigarettes. If you need help quitting, ask your health care provider. PREVENTION  The best way to protect yourself from getting a cold is to practice good hygiene.   Avoid oral or hand contact with people with cold symptoms.   Wash your hands often if  contact occurs.  There is no clear evidence that vitamin C, vitamin E, echinacea, or exercise reduces the chance of developing a cold. However, it is always recommended to get plenty of rest, exercise, and practice good nutrition.  SEEK MEDICAL CARE IF:   You are getting worse rather than better.   Your symptoms are not controlled by medicine.   You have chills.  You have worsening shortness of breath.  You have brown or red mucus.  You have yellow or brown nasal discharge.  You have pain in your face, especially when you bend forward.  You have a fever.  You have swollen neck glands.  You have pain while swallowing.  You have white areas in the back of your throat. SEEK IMMEDIATE MEDICAL CARE IF:   You have severe or persistent:  Headache.  Ear pain.  Sinus pain.  Chest pain.  You have chronic lung disease and any of the following:  Wheezing.  Prolonged cough.  Coughing up blood.  A change in your usual mucus.  You have a stiff neck.  You have changes in your:  Vision.  Hearing.  Thinking.  Mood. MAKE SURE YOU:   Understand these instructions.  Will watch your condition.  Will get help right away if you are not doing well or get worse.   This information is not intended to replace advice given to you by your health care provider. Make sure you discuss any questions you have with your health care provider.   Document Released: 09/22/2000 Document Revised: 08/13/2014 Document Reviewed: 07/04/2013 Elsevier Interactive Patient Education [employer].

## 2016-01-06 DIAGNOSIS — M542 Cervicalgia: Secondary | ICD-10-CM | POA: Diagnosis not present

## 2016-01-06 DIAGNOSIS — G518 Other disorders of facial nerve: Secondary | ICD-10-CM | POA: Diagnosis not present

## 2016-01-06 DIAGNOSIS — M791 Myalgia: Secondary | ICD-10-CM | POA: Diagnosis not present

## 2016-01-06 DIAGNOSIS — R51 Headache: Secondary | ICD-10-CM | POA: Diagnosis not present

## 2016-01-21 DIAGNOSIS — G518 Other disorders of facial nerve: Secondary | ICD-10-CM | POA: Diagnosis not present

## 2016-01-21 DIAGNOSIS — M791 Myalgia: Secondary | ICD-10-CM | POA: Diagnosis not present

## 2016-01-21 DIAGNOSIS — R51 Headache: Secondary | ICD-10-CM | POA: Diagnosis not present

## 2016-01-21 DIAGNOSIS — M542 Cervicalgia: Secondary | ICD-10-CM | POA: Diagnosis not present

## 2016-02-12 DIAGNOSIS — H11423 Conjunctival edema, bilateral: Secondary | ICD-10-CM | POA: Diagnosis not present

## 2016-02-12 DIAGNOSIS — Z961 Presence of intraocular lens: Secondary | ICD-10-CM | POA: Diagnosis not present

## 2016-02-12 DIAGNOSIS — H18413 Arcus senilis, bilateral: Secondary | ICD-10-CM | POA: Diagnosis not present

## 2016-02-12 DIAGNOSIS — H2512 Age-related nuclear cataract, left eye: Secondary | ICD-10-CM | POA: Diagnosis not present

## 2016-02-12 DIAGNOSIS — H04123 Dry eye syndrome of bilateral lacrimal glands: Secondary | ICD-10-CM | POA: Diagnosis not present

## 2016-02-12 DIAGNOSIS — Z9841 Cataract extraction status, right eye: Secondary | ICD-10-CM | POA: Diagnosis not present

## 2016-02-12 DIAGNOSIS — H5340 Unspecified visual field defects: Secondary | ICD-10-CM | POA: Diagnosis not present

## 2016-02-12 DIAGNOSIS — H5202 Hypermetropia, left eye: Secondary | ICD-10-CM | POA: Diagnosis not present

## 2016-02-12 DIAGNOSIS — H52223 Regular astigmatism, bilateral: Secondary | ICD-10-CM | POA: Diagnosis not present

## 2016-02-12 DIAGNOSIS — H11153 Pinguecula, bilateral: Secondary | ICD-10-CM | POA: Diagnosis not present

## 2016-02-12 DIAGNOSIS — H40023 Open angle with borderline findings, high risk, bilateral: Secondary | ICD-10-CM | POA: Diagnosis not present

## 2016-03-03 DIAGNOSIS — M542 Cervicalgia: Secondary | ICD-10-CM | POA: Diagnosis not present

## 2016-03-03 DIAGNOSIS — M791 Myalgia: Secondary | ICD-10-CM | POA: Diagnosis not present

## 2016-03-03 DIAGNOSIS — G518 Other disorders of facial nerve: Secondary | ICD-10-CM | POA: Diagnosis not present

## 2016-03-03 DIAGNOSIS — R51 Headache: Secondary | ICD-10-CM | POA: Diagnosis not present

## 2016-03-07 ENCOUNTER — Other Ambulatory Visit: Payer: Self-pay | Admitting: Cardiovascular Disease

## 2016-03-11 ENCOUNTER — Encounter: Payer: Self-pay | Admitting: Family Medicine

## 2016-03-11 ENCOUNTER — Ambulatory Visit (INDEPENDENT_AMBULATORY_CARE_PROVIDER_SITE_OTHER): Payer: PPO | Admitting: Family Medicine

## 2016-03-11 VITALS — BP 114/70 | HR 62 | Temp 97.7°F | Resp 12 | Wt 231.0 lb

## 2016-03-11 DIAGNOSIS — R197 Diarrhea, unspecified: Secondary | ICD-10-CM | POA: Diagnosis not present

## 2016-03-11 DIAGNOSIS — Z8719 Personal history of other diseases of the digestive system: Secondary | ICD-10-CM | POA: Diagnosis not present

## 2016-03-11 DIAGNOSIS — R140 Abdominal distension (gaseous): Secondary | ICD-10-CM

## 2016-03-11 DIAGNOSIS — R10812 Left upper quadrant abdominal tenderness: Secondary | ICD-10-CM

## 2016-03-11 LAB — COMPREHENSIVE METABOLIC PANEL
ALT: 11 U/L (ref 9–46)
AST: 13 U/L (ref 10–35)
Albumin: 4 g/dL (ref 3.6–5.1)
Alkaline Phosphatase: 62 U/L (ref 40–115)
BUN: 11 mg/dL (ref 7–25)
CO2: 27 mmol/L (ref 20–31)
Calcium: 9.1 mg/dL (ref 8.6–10.3)
Chloride: 105 mmol/L (ref 98–110)
Creat: 1.22 mg/dL — ABNORMAL HIGH (ref 0.70–1.18)
Glucose, Bld: 80 mg/dL (ref 65–99)
Potassium: 4.5 mmol/L (ref 3.5–5.3)
Sodium: 139 mmol/L (ref 135–146)
Total Bilirubin: 0.4 mg/dL (ref 0.2–1.2)
Total Protein: 6.7 g/dL (ref 6.1–8.1)

## 2016-03-11 LAB — CBC WITH DIFFERENTIAL/PLATELET
Basophils Absolute: 0 cells/uL (ref 0–200)
Basophils Relative: 0 %
Eosinophils Absolute: 282 cells/uL (ref 15–500)
Eosinophils Relative: 6 %
HCT: 34.1 % — ABNORMAL LOW (ref 38.5–50.0)
Hemoglobin: 10.6 g/dL — ABNORMAL LOW (ref 13.2–17.1)
Lymphocytes Relative: 26 %
Lymphs Abs: 1222 cells/uL (ref 850–3900)
MCH: 21.3 pg — ABNORMAL LOW (ref 27.0–33.0)
MCHC: 31.1 g/dL — ABNORMAL LOW (ref 32.0–36.0)
MCV: 68.5 fL — ABNORMAL LOW (ref 80.0–100.0)
MPV: 8.6 fL (ref 7.5–12.5)
Monocytes Absolute: 423 cells/uL (ref 200–950)
Monocytes Relative: 9 %
Neutro Abs: 2773 cells/uL (ref 1500–7800)
Neutrophils Relative %: 59 %
Platelets: 176 10*3/uL (ref 140–400)
RBC: 4.98 MIL/uL (ref 4.20–5.80)
RDW: 18.2 % — ABNORMAL HIGH (ref 11.0–15.0)
WBC: 4.7 10*3/uL (ref 4.0–10.5)

## 2016-03-11 LAB — POCT URINALYSIS DIPSTICK
Bilirubin, UA: NEGATIVE
Blood, UA: NEGATIVE
Glucose, UA: NEGATIVE
Ketones, UA: NEGATIVE
Leukocytes, UA: NEGATIVE
Nitrite, UA: NEGATIVE
Protein, UA: NEGATIVE
Spec Grav, UA: 1.015
Urobilinogen, UA: NEGATIVE
pH, UA: 7

## 2016-03-11 LAB — LIPASE: Lipase: 64 U/L — ABNORMAL HIGH (ref 7–60)

## 2016-03-11 LAB — AMYLASE: Amylase: 64 U/L (ref 0–105)

## 2016-03-11 NOTE — Patient Instructions (Addendum)
Make sure you are staying well hydrated, drink water and not only soda.  Eat a bland diet. See the information below for recommendations.  If you notice abdominal pain or start vomiting again or have a fever, call me or go to the emergency department if we are closed.  You do not appear infectious today.  If you are still having diarrhea in spite of changing your diet and taking the Immodium then you will need to call and return Monday for more testing.   Take Immodium A-D 1 tablet and then 1/2 tablet after each watery stool. Do not take more than 3 total tablets in a 24 hour period.     Bland Diet Introduction A bland diet consists of foods that do not have a lot of fat or fiber. Foods without fat or fiber are easier for the body to digest. They are also less likely to irritate your mouth, throat, stomach, and other parts of your gastrointestinal tract. A bland diet is sometimes called a BRAT diet. What is my plan? Your health care provider or dietitian may recommend specific changes to your diet to prevent and treat your symptoms, such as:  Eating small meals often.  Cooking food until it is soft enough to chew easily.  Chewing your food well.  Drinking fluids slowly.  Not eating foods that are very spicy, sour, or fatty.  Not eating citrus fruits, such as oranges and grapefruit. What do I need to know about this diet?  Eat a variety of foods from the bland diet food list.  Do not follow a bland diet longer than you have to.  Ask your health care provider whether you should take vitamins. What foods can I eat? Grains  Hot cereals, such as cream of wheat. Bread, crackers, or tortillas made from refined white flour. Rice. Vegetables  Canned or cooked vegetables. Mashed or boiled potatoes. Fruits  Bananas. Applesauce. Other types of cooked or canned fruit with the skin and seeds removed, such as canned peaches or pears. Meats and Other Protein Sources  Scrambled eggs. Creamy  peanut butter or other nut butters. Lean, well-cooked meats, such as chicken or fish. Tofu. Soups or broths. Dairy  Low-fat dairy products, such as milk, cottage cheese, or yogurt. Beverages  Water. Herbal tea. Apple juice. Sweets and Desserts  Pudding. Custard. Fruit gelatin. Ice cream. Fats and Oils  Mild salad dressings. Canola or olive oil. The items listed above may not be a complete list of allowed foods or beverages. Contact your dietitian for more options.  What foods are not recommended? Foods and ingredients that are often not recommended include:  Spicy foods, such as hot sauce or salsa.  Fried foods.  Sour foods, such as pickled or fermented foods.  Raw vegetables or fruits, especially citrus or berries.  Caffeinated drinks.  Alcohol.  Strongly flavored seasonings or condiments. The items listed above may not be a complete list of foods and beverages that are not allowed. Contact your dietitian for more information.  This information is not intended to replace advice given to you by your health care provider. Make sure you discuss any questions you have with your health care provider. Document Released: 07/21/2015 Document Revised: 09/04/2015 Document Reviewed: 04/10/2014  2017 Elsevier    Diarrhea, Adult Diarrhea is frequent loose and watery bowel movements. Diarrhea can make you feel weak and cause you to become dehydrated. Dehydration can make you tired and thirsty, cause you to have a dry mouth, and decrease  how often you urinate. Diarrhea typically lasts 2-3 days. However, it can last longer if it is a sign of something more serious. It is important to treat your diarrhea as told by your health care provider. Follow these instructions at home: Eating and drinking Follow these recommendations as told by your health care provider:  Take an oral rehydration solution (ORS). This is a drink that is sold at pharmacies and retail stores.  Drink clear fluids, such  as water, ice chips, diluted fruit juice, and low-calorie sports drinks.  Eat bland, easy-to-digest foods in small amounts as you are able. These foods include bananas, applesauce, rice, lean meats, toast, and crackers.  Avoid drinking fluids that contain a lot of sugar or caffeine, such as energy drinks, sports drinks, and soda.  Avoid alcohol.  Avoid spicy or fatty foods. General instructions  Drink enough fluid to keep your urine clear or pale yellow.  Wash your hands often. If soap and water are not available, use hand sanitizer.  Make sure that all people in your household wash their hands well and often.  Take over-the-counter and prescription medicines only as told by your health care provider.  Rest at home while you recover.  Watch your condition for any changes.  Take a warm bath to relieve any burning or pain from frequent diarrhea episodes.  Keep all follow-up visits as told by your health care provider. This is important. Contact a health care provider if:  You have a fever.  Your diarrhea gets worse.  You have new symptoms.  You cannot keep fluids down.  You feel light-headed or dizzy.  You have a headache  You have muscle cramps. Get help right away if:  You have chest pain.  You feel extremely weak or you faint.  You have bloody or black stools or stools that look like tar.  You have severe pain, cramping, or bloating in your abdomen.  You have trouble breathing or you are breathing very quickly.  Your heart is beating very quickly.  Your skin feels cold and clammy.  You feel confused.  You have signs of dehydration, such as:  Dark urine, very little urine, or no urine.  Cracked lips.  Dry mouth.  Sunken eyes.  Sleepiness.  Weakness. This information is not intended to replace advice given to you by your health care provider. Make sure you discuss any questions you have with your health care provider. Document Released:  03/19/2002 Document Revised: 08/07/2015 Document Reviewed: 12/03/2014 Elsevier Interactive Patient Education  2017 [employer].

## 2016-03-11 NOTE — Progress Notes (Signed)
Subjective:    [name], male    DOB: 12/29/1940, 75 y.o.   MRN: [id number]  HPI Chief Complaint  Patient presents with  . GI Problem    x 2 weeks stomach hurts diarreha    He is here with complaints of a 1 week history of diarrhea that occurs mainly after eating.  States he had vomiting initially 2 weeks ago but then that resolved and started having diarrhea. Reports having 2-3 stools per day that are watery and brownish. No blood or pus. Has not vomited in the past week. No abdominal pain or nausea. Reports normal appetite and has been eating frozen meals for the past 2 weeks due to having new dentures. States he has not been eating his usual diet.  Denies fever, chills, dizziness, weakness, fatigue, chest pain, palpitations, shortness of breath, orthopnea, urinary symptoms, LE edema.   He had a colonoscopy in 2008 by Dr. Deatra Ina.  History of diverticulosis. Due for next colonoscopy in 2018 per chart.    2 weeks ago his Gabapentin was increased. No recent antibiotics. No recent travel.   States he drinks 3 cups of coffee and diet coke all day. Does not drink water.   States he has tried milk of magnesia and pepto bismuth for diarrhea without relief.   Reviewed allergies, medications, past medical, surgical, and social history.    Review of Systems Pertinent positives and negatives in the history of present illness.     Objective:   Physical Exam  Constitutional: He is oriented to person, place, and time. He appears well-developed and well-nourished. He does not have a sickly appearance. No distress.  Cardiovascular: Normal rate, regular rhythm, normal heart sounds and normal pulses.   Pulmonary/Chest: Effort normal and breath sounds normal.  Abdominal: Soft. Normal appearance. Bowel sounds are increased. There is no hepatosplenomegaly. There is tenderness in the left upper quadrant. There is no rigidity, no rebound, no guarding, no CVA tenderness, no  tenderness at McBurney's point and negative Murphy's sign.  Neurological: He is alert and oriented to person, place, and time. He has normal strength. Coordination and gait normal.  Skin: Skin is warm and dry. No rash noted. He is not diaphoretic. No pallor.  Psychiatric: He has a normal mood and affect. His speech is normal and behavior is normal. Thought content normal.   BP 114/70   Pulse 62   Temp 97.7 F (36.5 C) (Oral)   Resp 12   Wt 231 lb (104.8 kg)   SpO2 97%   BMI 32.22 kg/m   Urinalysis dipstick: negative    Assessment & Plan:  Diarrhea, unspecified type - Plan: POCT Urinalysis Dipstick, CBC with Differential/Platelet, Comprehensive metabolic panel  Left upper quadrant abdominal tenderness without rebound tenderness - Plan: Lipase, Amylase  History of diverticulosis  Abdominal bloating  Discussed that he does not appear to be dehydrated and he does not have an infection. Suspect the recent change in his diet to all frozen meals may be related to his loose stools. Plan to have him eat a bland diet, stay well hydrated and try Immodium. Reviewed his colonoscopy report.  Discussed red flags of abdominal pain and when to return or seek emergency treatment.  He will call tomorrow and let me know how he is doing. If he continues having diarrhea in spite of conservative treatment then we will bring him back next Monday and do more testing including stool studies. Follow up pending labs as well.

## 2016-03-22 ENCOUNTER — Ambulatory Visit (INDEPENDENT_AMBULATORY_CARE_PROVIDER_SITE_OTHER): Payer: PPO | Admitting: Family Medicine

## 2016-03-22 ENCOUNTER — Encounter: Payer: Self-pay | Admitting: Family Medicine

## 2016-03-22 VITALS — BP 130/78 | HR 58 | Wt 234.0 lb

## 2016-03-22 DIAGNOSIS — D509 Iron deficiency anemia, unspecified: Secondary | ICD-10-CM

## 2016-03-22 DIAGNOSIS — I10 Essential (primary) hypertension: Secondary | ICD-10-CM

## 2016-03-22 DIAGNOSIS — R7303 Prediabetes: Secondary | ICD-10-CM | POA: Diagnosis not present

## 2016-03-22 LAB — POCT GLYCOSYLATED HEMOGLOBIN (HGB A1C): Hemoglobin A1C: 6.2

## 2016-03-22 NOTE — Patient Instructions (Addendum)
Keep taking daily iron.  Return the 3 stool cards at your convenience. Return in about 6 weeks for a lab visit to have your blood count checked.  Watch your sodium intake and sugar intake. Start getting 10 minutes of physical activity twice daily. Keep checking your blood pressures.  DASH Eating Plan DASH stands for "Dietary Approaches to Stop Hypertension." The DASH eating plan is a healthy eating plan that has been shown to reduce high blood pressure (hypertension). Additional health benefits may include reducing the risk of type 2 diabetes mellitus, heart disease, and stroke. The DASH eating plan may also help with weight loss. What do I need to know about the DASH eating plan? For the DASH eating plan, you will follow these general guidelines:  Choose foods with less than 150 milligrams of sodium per serving (as listed on the food label).  Use salt-free seasonings or herbs instead of table salt or sea salt.  Check with your health care provider or pharmacist before using salt substitutes.  Eat lower-sodium products. These are often labeled as "low-sodium" or "no salt added."  Eat fresh foods. Avoid eating a lot of canned foods.  Eat more vegetables, fruits, and low-fat dairy products.  Choose whole grains. Look for the word "whole" as the first word in the ingredient list.  Choose fish and skinless chicken or [place] more often than red meat. Limit fish, poultry, and meat to 6 oz (170 g) each day.  Limit sweets, desserts, sugars, and sugary drinks.  Choose heart-healthy fats.  Eat more home-cooked food and less restaurant, buffet, and fast food.  Limit fried foods.  Do not fry foods. Cook foods using methods such as baking, boiling, grilling, and broiling instead.  When eating at a restaurant, ask that your food be prepared with less salt, or no salt if possible. What foods can I eat? Seek help from a dietitian for individual calorie needs. Grains  Whole grain or whole  wheat bread. Brown rice. Whole grain or whole wheat pasta. Quinoa, bulgur, and whole grain cereals. Low-sodium cereals. Corn or whole wheat flour tortillas. Whole grain cornbread. Whole grain crackers. Low-sodium crackers. Vegetables  Fresh or frozen vegetables (raw, steamed, roasted, or grilled). Low-sodium or reduced-sodium tomato and vegetable juices. Low-sodium or reduced-sodium tomato sauce and paste. Low-sodium or reduced-sodium canned vegetables. Fruits  All fresh, canned (in natural juice), or frozen fruits. Meat and Other Protein Products  Ground beef (85% or leaner), grass-fed beef, or beef trimmed of fat. Skinless chicken or [place]. Ground chicken or [place]. Pork trimmed of fat. All fish and seafood. Eggs. Dried beans, peas, or lentils. Unsalted nuts and seeds. Unsalted canned beans. Dairy  Low-fat dairy products, such as skim or 1% milk, 2% or reduced-fat cheeses, low-fat ricotta or cottage cheese, or plain low-fat yogurt. Low-sodium or reduced-sodium cheeses. Fats and Oils  Tub margarines without trans fats. Light or reduced-fat mayonnaise and salad dressings (reduced sodium). Avocado. Safflower, olive, or canola oils. Natural peanut or almond butter. Other  Unsalted popcorn and pretzels. The items listed above may not be a complete list of recommended foods or beverages. Contact your dietitian for more options.  What foods are not recommended? Grains  White bread. White pasta. White rice. Refined cornbread. Bagels and croissants. Crackers that contain trans fat. Vegetables  Creamed or fried vegetables. Vegetables in a cheese sauce. Regular canned vegetables. Regular canned tomato sauce and paste. Regular tomato and vegetable juices. Fruits  Canned fruit in light or heavy syrup. Fruit juice.  Meat and Other Protein Products  Fatty cuts of meat. Ribs, chicken wings, bacon, sausage, bologna, salami, chitterlings, fatback, hot dogs, bratwurst, and packaged luncheon meats. Salted nuts  and seeds. Canned beans with salt. Dairy  Whole or 2% milk, cream, half-and-half, and cream cheese. Whole-fat or sweetened yogurt. Full-fat cheeses or blue cheese. Nondairy creamers and whipped toppings. Processed cheese, cheese spreads, or cheese curds. Condiments  Onion and garlic salt, seasoned salt, table salt, and sea salt. Canned and packaged gravies. Worcestershire sauce. Tartar sauce. Barbecue sauce. Teriyaki sauce. Soy sauce, including reduced sodium. Steak sauce. Fish sauce. Oyster sauce. Cocktail sauce. Horseradish. Ketchup and mustard. Meat flavorings and tenderizers. Bouillon cubes. Hot sauce. Tabasco sauce. Marinades. Taco seasonings. Relishes. Fats and Oils  Butter, stick margarine, lard, shortening, ghee, and bacon fat. Coconut, palm kernel, or palm oils. Regular salad dressings. Other  Pickles and olives. Salted popcorn and pretzels. The items listed above may not be a complete list of foods and beverages to avoid. Contact your dietitian for more information.  Where can I find more information? National Heart, Lung, and Blood Institute: travelstabloid.com This information is not intended to replace advice given to you by your health care provider. Make sure you discuss any questions you have with your health care provider. Document Released: 03/18/2011 Document Revised: 09/04/2015 Document Reviewed: 01/31/2013 Elsevier Interactive Patient Education  2017 [employer].

## 2016-03-22 NOTE — Progress Notes (Signed)
Subjective:    [name], male    DOB: 08-07-1940, 75 y.o.   MRN: [id number]  HPI Chief Complaint  Patient presents with  . follow-up    3 month follow-up   He is here for a follow up on HTN and pre-diabetes. His A1C in September was 6.0% and he was educated on healthy lifestyle modifications to prolong the development of diabetes. He is taking Metformin once daily.  He is not physically active.  He lives alone and has started cooking healthy meals as opposed to frozen dinners.    No complaints or concerns. He has his BP cuff from home with the readings for the past month. Almost all readings are <130/80.   He has a history of anemia. Started back taking iron 2 weeks ago. Due for colonoscopy next year per chart. He denies any blood in his stool.   Denies fever, chills, unexplained weight loss, fatigue, chest pain, palpitations, DOE, abdominal pain, N/V/D. Denies urinary frequency or polyuria.   States he also goes to the [facility] in [place].  He sees a urologist for prostate.   At his last visit on 03/11/2016 he was having issues with diarrhea. This has resolved.   He saw his cardiologist in August and is currently being treated for CAD. He will follow up with Dr. Angelena Form in August 2018.   Due for a CPE in June.   Past Medical History:  Diagnosis Date  . Angina   . Atypical chest pain   . CAD (coronary artery disease)    A. Negative MV 04/2010;  B. Inadequate Stress Echo 05/2011;  C. 05/29/11 Cath: - Med Rx;  D. 05/2012 Cath/PCI: LM nl, LAD 40p, LCX ectatic, OM1 169m, OM2 90p (3.5x16 Veriflex BMS), RCA dominant 40p/d, PDA 50p, EF 55-65%.  . Cancer ([place])    Hx: of prostate  . COPD (chronic obstructive pulmonary disease) ([place])   . Cough secondary to angiotensin converting enzyme inhibitor (ACE-I)   . Diabetes mellitus without complication ([place])   . DVT (deep venous thrombosis) ([place])   . GERD (gastroesophageal reflux disease)   . Headache(784.0)    Hx: of    . History of blood transfusion    "I've had 2; don't know what it was related to" (11/09/2012)  . HLD (hyperlipidemia)   . HTN (hypertension)   . Iron deficiency anemia   . Microcytic anemia   . Peripheral vascular disease ([place])   . Rotator cuff injury    "left arm; never repaired" (11/09/2012)  . Shortness of breath    "can come on at anytime" (11/09/2012)     Review of Systems Pertinent positives and negatives in the history of present illness.     Objective:   Physical Exam BP 130/78   Pulse (!) 58   Wt 234 lb (106.1 kg)   BMI 32.64 kg/m  Alert and in no distress.Pharyngeal area is normal. Neck is supple without adenopathy or thyromegaly. Cardiac exam shows a regular sinus rhythm without murmurs or gallops. Lungs are clear to auscultation. Abdomen soft, nontender, normal bowel sounds. Extremities without edema and normal pulses.  Hemoglobin A1c 6.2%     Assessment & Plan:  Essential hypertension  Prediabetes - Plan: POCT glycosylated hemoglobin (Hb A1C), Microalbumin / creatinine urine ratio  Iron deficiency anemia, unspecified iron deficiency anemia type - Plan: CBC with Differential/Platelet, POCT occult blood stool  Discussed that he is still in the prediabetes range. Counseled on healthy diet and exercise  for blood sugar and blood pressure management. Continue on metformin.  He did bring in his BP cuff and his readings have been within goal range. Discussed DASH diet and good medication adherence. Recommend low sodium intake.   He has started taking iron again as of 2 weeks ago. Plan to have him return stool cards to check for blood loss and have him return for repeat CBC in 6 weeks.

## 2016-03-23 LAB — MICROALBUMIN / CREATININE URINE RATIO
Creatinine, Urine: 25 mg/dL (ref 20–370)
Microalb, Ur: <0.2 mg/dL

## 2016-03-26 ENCOUNTER — Other Ambulatory Visit (INDEPENDENT_AMBULATORY_CARE_PROVIDER_SITE_OTHER): Payer: PPO

## 2016-03-26 DIAGNOSIS — Z1211 Encounter for screening for malignant neoplasm of colon: Secondary | ICD-10-CM

## 2016-03-29 LAB — HEMOCCULT GUIAC POC 1CARD (OFFICE)
Card #2 Fecal Occult Blod, POC: NEGATIVE
Card #3 Fecal Occult Blood, POC: NEGATIVE
Fecal Occult Blood, POC: NEGATIVE

## 2016-04-04 ENCOUNTER — Emergency Department (HOSPITAL_COMMUNITY)
Admission: EM | Admit: 2016-04-04 | Discharge: 2016-04-04 | Disposition: A | Discharge status: Home / Self Care | Payer: PPO | Attending: Emergency Medicine | Admitting: Emergency Medicine

## 2016-04-04 ENCOUNTER — Encounter (HOSPITAL_COMMUNITY): Payer: Self-pay | Admitting: *Deleted

## 2016-04-04 DIAGNOSIS — I2510 Atherosclerotic heart disease of native coronary artery without angina pectoris: Secondary | ICD-10-CM | POA: Diagnosis not present

## 2016-04-04 DIAGNOSIS — E119 Type 2 diabetes mellitus without complications: Secondary | ICD-10-CM | POA: Diagnosis not present

## 2016-04-04 DIAGNOSIS — J449 Chronic obstructive pulmonary disease, unspecified: Secondary | ICD-10-CM | POA: Insufficient documentation

## 2016-04-04 DIAGNOSIS — D649 Anemia, unspecified: Secondary | ICD-10-CM | POA: Diagnosis not present

## 2016-04-04 DIAGNOSIS — Z955 Presence of coronary angioplasty implant and graft: Secondary | ICD-10-CM | POA: Diagnosis not present

## 2016-04-04 DIAGNOSIS — M5441 Lumbago with sciatica, right side: Secondary | ICD-10-CM

## 2016-04-04 DIAGNOSIS — I10 Essential (primary) hypertension: Secondary | ICD-10-CM | POA: Diagnosis not present

## 2016-04-04 DIAGNOSIS — Z79899 Other long term (current) drug therapy: Secondary | ICD-10-CM | POA: Insufficient documentation

## 2016-04-04 DIAGNOSIS — Z7982 Long term (current) use of aspirin: Secondary | ICD-10-CM | POA: Diagnosis not present

## 2016-04-04 DIAGNOSIS — M545 Low back pain: Secondary | ICD-10-CM | POA: Diagnosis not present

## 2016-04-04 DIAGNOSIS — N289 Disorder of kidney and ureter, unspecified: Secondary | ICD-10-CM

## 2016-04-04 DIAGNOSIS — D509 Iron deficiency anemia, unspecified: Secondary | ICD-10-CM

## 2016-04-04 DIAGNOSIS — Z8546 Personal history of malignant neoplasm of prostate: Secondary | ICD-10-CM | POA: Diagnosis not present

## 2016-04-04 DIAGNOSIS — Z8673 Personal history of transient ischemic attack (TIA), and cerebral infarction without residual deficits: Secondary | ICD-10-CM | POA: Diagnosis not present

## 2016-04-04 DIAGNOSIS — Z7984 Long term (current) use of oral hypoglycemic drugs: Secondary | ICD-10-CM | POA: Diagnosis not present

## 2016-04-04 DIAGNOSIS — Z87891 Personal history of nicotine dependence: Secondary | ICD-10-CM | POA: Diagnosis not present

## 2016-04-04 LAB — COMPREHENSIVE METABOLIC PANEL
ALT: 18 U/L (ref 17–63)
AST: 19 U/L (ref 15–41)
Albumin: 3.8 g/dL (ref 3.5–5.0)
Alkaline Phosphatase: 69 U/L (ref 38–126)
Anion gap: 9 (ref 5–15)
BUN: 14 mg/dL (ref 6–20)
CO2: 23 mmol/L (ref 22–32)
Calcium: 9 mg/dL (ref 8.9–10.3)
Chloride: 105 mmol/L (ref 101–111)
Creatinine, Ser: 1.35 mg/dL — ABNORMAL HIGH (ref 0.61–1.24)
GFR calc Af Amer: 57 mL/min — ABNORMAL LOW (ref >60)
GFR calc non Af Amer: 49 mL/min — ABNORMAL LOW (ref >60)
Glucose, Bld: 115 mg/dL — ABNORMAL HIGH (ref 65–99)
Potassium: 3.7 mmol/L (ref 3.5–5.1)
Sodium: 137 mmol/L (ref 135–145)
Total Bilirubin: 0.3 mg/dL (ref 0.3–1.2)
Total Protein: 6.8 g/dL (ref 6.5–8.1)

## 2016-04-04 LAB — URINALYSIS, ROUTINE W REFLEX MICROSCOPIC
Bilirubin Urine: NEGATIVE
Glucose, UA: NEGATIVE mg/dL
Hgb urine dipstick: NEGATIVE
Ketones, ur: NEGATIVE mg/dL
Leukocytes, UA: NEGATIVE
Nitrite: NEGATIVE
Protein, ur: NEGATIVE mg/dL
Specific Gravity, Urine: 1.004 — ABNORMAL LOW (ref 1.005–1.030)
pH: 6 (ref 5.0–8.0)

## 2016-04-04 LAB — CBC
HCT: 34.1 % — ABNORMAL LOW (ref 39.0–52.0)
Hemoglobin: 10.9 g/dL — ABNORMAL LOW (ref 13.0–17.0)
MCH: 21.5 pg — ABNORMAL LOW (ref 26.0–34.0)
MCHC: 32 g/dL (ref 30.0–36.0)
MCV: 67.3 fL — ABNORMAL LOW (ref 78.0–100.0)
Platelets: 185 10*3/uL (ref 150–400)
RBC: 5.07 MIL/uL (ref 4.22–5.81)
RDW: 16.6 % — ABNORMAL HIGH (ref 11.5–15.5)
WBC: 4.7 10*3/uL (ref 4.0–10.5)

## 2016-04-04 LAB — LIPASE, BLOOD: Lipase: 36 U/L (ref 11–51)

## 2016-04-04 MED ORDER — OXYCODONE-ACETAMINOPHEN 5-325 MG PO TABS: 1.0000 | ORAL_TABLET | ORAL | 0 refills | Status: DC | PRN

## 2016-04-04 NOTE — Discharge Instructions (Signed)
The cause for your pain is not clear. If it recurs and is not relieved with oxycodone-acetaminophen, then return to the emergency department for additional testing.

## 2016-04-04 NOTE — ED Triage Notes (Signed)
The pt is c/o rt flank pain and down into his rt leg when he was attempting to go to bed 2330  The pain at present is not quiet as bad as it was  No urinary symptoms

## 2016-04-04 NOTE — ED Provider Notes (Signed)
[facility] DEPT Provider Note   CSN: [id number] Arrival date & time: 04/04/16  [id number]     History   Chief Complaint Chief Complaint  Patient presents with  . Flank Pain    HPI Joel [name] is a 75 y.o. male.  He was in bed when he is awakened by pain in the right lower back which radiated down his right leg. Pain is severe and he rated it at 10/10. Nothing made it better nothing made it worse. He denied any weakness, numbness, tingling. He denies any recent trauma or overuse. He came to the ED because of pain. While in the waiting room, pain resolved spontaneously and is now completely gone. He is treated for peripheral vascular disease but he cannot recall having had pain like this before.   The history is provided by the patient.  Flank Pain     Past Medical History:  Diagnosis Date  . Angina   . Atypical chest pain   . CAD (coronary artery disease)    A. Negative MV 04/2010;  B. Inadequate Stress Echo 05/2011;  C. 05/29/11 Cath: - Med Rx;  D. 05/2012 Cath/PCI: LM nl, LAD 40p, LCX ectatic, OM1 147m, OM2 90p (3.5x16 Veriflex BMS), RCA dominant 40p/d, PDA 50p, EF 55-65%.  . Cancer ([place])    Hx: of prostate  . COPD (chronic obstructive pulmonary disease) ([place])   . Cough secondary to angiotensin converting enzyme inhibitor (ACE-I)   . Diabetes mellitus without complication ([place])   . DVT (deep venous thrombosis) ([place])   . GERD (gastroesophageal reflux disease)   . Headache(784.0)    Hx: of  . History of blood transfusion    "I've had 2; don't know what it was related to" (11/09/2012)  . HLD (hyperlipidemia)   . HTN (hypertension)   . Iron deficiency anemia   . Microcytic anemia   . Peripheral vascular disease ([place])   . Rotator cuff injury    "left arm; never repaired" (11/09/2012)  . Shortness of breath    "can come on at anytime" (11/09/2012)    Patient Active Problem List   Diagnosis Date Noted  . Prediabetes 03/22/2016  . Chest pain 09/10/2014  . Pain in the  chest 12/15/2013  . PVD (peripheral vascular disease) with claudication ([place]) 11/15/2012  . Aftercare following surgery of the circulatory system, [place] 11/15/2012  . Chest pain, localized 11/10/2012  . Hypokalemia 11/10/2012  . Bradycardia 11/09/2012  . Unstable angina ([place]) 06/01/2012  . Thrombocytopenia ([place]) 06/01/2012  . Microcytic anemia   . Angina effort ([place]) 05/18/2012  . Atherosclerosis of native arteries of the extremities with intermittent claudication 06/16/2011  . Stable angina ([place]) 06/11/2011  . CAD (coronary artery disease)   . Chest pain, atypical 05/30/2011  . Iron deficiency anemia 10/03/2009  . PVD 10/03/2009  . DIVERTICULOSIS OF COLON 10/03/2009  . WEIGHT LOSS, ABNORMAL 10/03/2009  . CHEST PAIN UNSPECIFIED 09/13/2008  . Headache(784.0) 08/19/2008  . TIA 06/05/2008  . SHOULDER PAIN, LEFT 10/11/2007  . COUGH DUE TO ACE INHIBITORS 05/07/2007  . History of tobacco use disorder 04/25/2007  . HYPERLIPIDEMIA 04/21/2007  . Essential hypertension 04/21/2007  . GERD 04/21/2007    Past Surgical History:  Procedure Laterality Date  . CARDIOVASCULAR STRESS TEST  Aug. 2014  . CATARACT EXTRACTION W/ INTRAOCULAR LENS IMPLANT Right ~ 2008  . CIRCUMCISION    . CORONARY ANGIOPLASTY WITH STENT PLACEMENT  2014   "1" (11/09/2012)  . ILIAC ARTERY ANEURYSM REPAIR    .  LEFT HEART CATH  05-31-12  . LEFT HEART CATHETERIZATION WITH CORONARY ANGIOGRAM N/A 06/01/2011   Procedure: LEFT HEART CATHETERIZATION WITH CORONARY ANGIOGRAM;  Surgeon: Hillary Bow, MD;  Location: [facility] CATH LAB;  Service: Cardiovascular;  Laterality: N/A;  . LEFT HEART CATHETERIZATION WITH CORONARY ANGIOGRAM N/A 05/31/2012   Procedure: LEFT HEART CATHETERIZATION WITH CORONARY ANGIOGRAM;  Surgeon: Peter M Martinique, MD;  Location: [facility] CATH LAB;  Service: Cardiovascular;  Laterality: N/A;  . LEFT HEART CATHETERIZATION WITH CORONARY ANGIOGRAM N/A 08/01/2013   Procedure: LEFT HEART CATHETERIZATION WITH CORONARY ANGIOGRAM;   Surgeon: Burnell Blanks, MD;  Location: [facility] CATH LAB;  Service: Cardiovascular;  Laterality: N/A;  . PERCUTANEOUS CORONARY STENT INTERVENTION (PCI-S)  05/31/2012   Procedure: PERCUTANEOUS CORONARY STENT INTERVENTION (PCI-S);  Surgeon: Peter M Martinique, MD;  Location: [facility] CATH LAB;  Service: Cardiovascular;;  . PR VEIN BYPASS GRAFT,AORTO-FEM-POP Left 10/01/2009   left below knee popliteal artery to posterior tibial artery  . SHOULDER ARTHROSCOPY WITH OPEN ROTATOR CUFF REPAIR AND DISTAL CLAVICLE ACROMINECTOMY Left 01/12/2013   Procedure: LEFT SHOULDER ARTHROSCOPY WITH DEBRIDEMENT OPEN DISTAL CLAVICLE RESECTION ,acromioplastyAND ROTATOR CUFF REPAIR;  Surgeon: Yvette Rack., MD;  Location: [facility];  Service: Orthopedics;  Laterality: Left;       Home Medications    Prior to Admission medications   Medication Sig Start Date End Date Taking? Authorizing Provider  albuterol (PROVENTIL HFA;VENTOLIN HFA) 108 (90 BASE) MCG/ACT inhaler Inhale 2 puffs into the lungs every 6 (six) hours as needed for wheezing or shortness of breath.    Historical Provider, MD  amLODipine (NORVASC) 10 MG tablet Take 1 tablet (10 mg total) by mouth daily. 05/04/14   Drenda Freeze, MD  aspirin EC 81 MG tablet Take 81 mg by mouth daily.     Historical Provider, MD  ferrous sulfate 325 (65 FE) MG tablet Take 325 mg by mouth daily with breakfast.    Historical Provider, MD  gabapentin (NEURONTIN) 600 MG tablet Take 600 mg by mouth at bedtime.    Historical Provider, MD  losartan-hydrochlorothiazide Konrad Penta) 100-25 MG tablet take 1 tablet by mouth once daily 03/08/16   Burnell Blanks, MD  metFORMIN (GLUCOPHAGE) 500 MG tablet Take 500 mg by mouth 2 (two) times daily with a meal. Reported on 08/27/2015    Historical Provider, MD  nitroGLYCERIN (NITROSTAT) 0.4 MG SL tablet Place 1 tablet (0.4 mg total) under the tongue every 5 (five) minutes as needed for chest pain. 12/08/15   Imogene Burn, PA-C  omeprazole (PRILOSEC)  20 MG capsule Take 20 mg by mouth 2 (two) times daily before a meal.    Historical Provider, MD  oxyCODONE-acetaminophen (PERCOCET) 5-325 MG tablet Take 1 tablet by mouth every 4 (four) hours as needed for moderate pain. [id number]   Delora Fuel, MD  pravastatin (PRAVACHOL) 40 MG tablet Take 40 mg by mouth every evening.    Historical Provider, MD  tamsulosin (FLOMAX) 0.4 MG CAPS capsule Take 0.4 mg by mouth daily after supper.     Historical Provider, MD  traMADol (ULTRAM) 50 MG tablet Take 1 tablet (50 mg total) by mouth every 6 (six) hours as needed for moderate pain. 10/16/15   Girtha Rm, NP    Family History Family History  Problem Relation Age of Onset  . Breast cancer Mother     deceased  . Hyperlipidemia Mother   . Hypertension Mother   . COPD Father     deceased  . Cancer  Father   . Hyperlipidemia Father   . Hypertension Father   . Hypertension Sister   . Cancer Brother   . Hypertension Brother   . Hypertension Daughter   . Hypertension Son     Social History Social History  Substance Use Topics  . Smoking status: Former Smoker    Packs/day: 1.00    Years: 55.00    Types: Cigarettes    Quit date: 05/26/2011  . Smokeless tobacco: Never Used  . Alcohol use No     Allergies   Atenolol; Imdur [isosorbide mononitrate]; Lisinopril; Topiramate; and Tramadol-acetaminophen   Review of Systems Review of Systems  Genitourinary: Positive for flank pain.  All other systems reviewed and are negative.    Physical Exam Updated Vital Signs BP 141/75 (BP Location: Left Arm)   Pulse 77   Temp 98.6 F (37 C) (Oral)   Resp 18   SpO2 97%   Physical Exam  Nursing note and vitals reviewed.  75 year old male, resting comfortably and in no acute distress. Vital signs are Significant for borderline hypertension. Oxygen saturation is 97%, which is normal. Head is normocephalic and atraumatic. PERRLA, EOMI. Oropharynx is clear. Neck is nontender and supple without  adenopathy or JVD. Back is nontender and there is no CVA tenderness. Lungs are clear without rales, wheezes, or rhonchi. Chest is nontender. Heart has regular rate and rhythm without murmur. Abdomen is soft, flat, nontender without masses or hepatosplenomegaly and peristalsis is normoactive. Extremities have no cyanosis or edema, full range of motion is present. Skin is warm and dry without rash. Neurologic: Mental status is normal, cranial nerves are intact, there are no motor or sensory deficits.  ED Treatments / Results  Labs (all labs ordered are listed, but only abnormal results are displayed) Labs Reviewed  COMPREHENSIVE METABOLIC PANEL - Abnormal; Notable for the following:       Result Value   Glucose, Bld 115 (*)    Creatinine, Ser 1.35 (*)    GFR calc non Af Amer 49 (*)    GFR calc Af Amer 57 (*)    All other components within normal limits  CBC - Abnormal; Notable for the following:    Hemoglobin 10.9 (*)    HCT 34.1 (*)    MCV 67.3 (*)    MCH 21.5 (*)    RDW 16.6 (*)    All other components within normal limits  URINALYSIS, ROUTINE W REFLEX MICROSCOPIC - Abnormal; Notable for the following:    Color, Urine STRAW (*)    Specific Gravity, Urine 1.004 (*)    All other components within normal limits  LIPASE, BLOOD    Procedures Procedures (including critical care time)  Medications Ordered in ED Medications - No data to display   Initial Impression / Assessment and Plan / ED Course  I have reviewed the triage vital signs and the nursing notes.  Pertinent labs & imaging results that were available during my care of the patient were reviewed by me and considered in my medical decision making (see chart for details).  Clinical Course    Right lower back pain of uncertain cause. Pain is has spontaneously resolved and exam is unremarkable. Old records are reviewed and he is treated for peripheral vascular disease and does have history of aneurysms in bilateral  iliac arteries. No evidence of vascular compromise today. He has had grafts and stents to all of his aneurysms. Laboratory workup shows renal insufficiency and microcytic anemia which are unchanged  from baseline. He is discharged with prescription for a small number of oxycodone-acetaminophen tablets, but told to return for further evaluation if symptoms recur.  Final Clinical Impressions(s) / ED Diagnoses   Final diagnoses:  Acute right-sided low back pain with right-sided sciatica  Renal insufficiency  Microcytic anemia    New Prescriptions New Prescriptions   OXYCODONE-ACETAMINOPHEN (PERCOCET) 5-325 MG TABLET    Take 1 tablet by mouth every 4 (four) hours as needed for moderate pain.     Delora Fuel, MD [id number]

## 2016-04-07 ENCOUNTER — Ambulatory Visit (INDEPENDENT_AMBULATORY_CARE_PROVIDER_SITE_OTHER): Payer: PPO | Admitting: Family Medicine

## 2016-04-07 ENCOUNTER — Encounter: Payer: Self-pay | Admitting: Family Medicine

## 2016-04-07 VITALS — BP 108/64 | HR 64 | Wt 235.6 lb

## 2016-04-07 DIAGNOSIS — M545 Low back pain, unspecified: Secondary | ICD-10-CM

## 2016-04-07 DIAGNOSIS — R42 Dizziness and giddiness: Secondary | ICD-10-CM

## 2016-04-07 DIAGNOSIS — R110 Nausea: Secondary | ICD-10-CM

## 2016-04-07 LAB — POCT URINALYSIS DIPSTICK
Bilirubin, UA: NEGATIVE
Blood, UA: NEGATIVE
Glucose, UA: NEGATIVE
Ketones, UA: NEGATIVE
Leukocytes, UA: NEGATIVE
Nitrite, UA: NEGATIVE
Protein, UA: NEGATIVE
Spec Grav, UA: 1.01
Urobilinogen, UA: NEGATIVE
pH, UA: 7

## 2016-04-07 NOTE — Progress Notes (Signed)
Subjective:    [name], male    DOB: 1940-09-05, 75 y.o.   MRN: [id number]  HPI Chief Complaint  Patient presents with  . sick    sick- nausea, dizziness, whoozing- last 2 days. feels like he can throw up   He is here with complaints of right low back pain that is present only when laying flat for long periods. Pain feels sharp at times and aches otherwise. Non radiating. States sitting upright and walking relieves pain. States he went to the ED for this pain and was prescribed oxycodone. He took 2 doses of this last evening and states pain did not improve.  Also complains of a 1 day history of nausea, dizziness, urinary frequency yesterday but back to normal today.   Reports history of ow back pain that is chronic and unchanged. States this does not feel like his usual back pain. States he has Tramadol at home but did not think about taking this to see if it helped.  He has tried Ibuprofen without relief.   Denies fever, chills, headache, tinnitus, vision changes, slurred speech, confusion, trouble swallowing, numbness, tingling, weakness. No vomiting or diarrhea.  Denies chest pain, palpitations, shortness of breath, cough, orthopnea, LE edema.   Reviewed allergies, medications, past medical, surgical,  and social history.  Past Medical History:  Diagnosis Date  . Angina   . Atypical chest pain   . CAD (coronary artery disease)    A. Negative MV 04/2010;  B. Inadequate Stress Echo 05/2011;  C. 05/29/11 Cath: - Med Rx;  D. 05/2012 Cath/PCI: LM nl, LAD 40p, LCX ectatic, OM1 159m, OM2 90p (3.5x16 Veriflex BMS), RCA dominant 40p/d, PDA 50p, EF 55-65%.  . Cancer ([place])    Hx: of prostate  . COPD (chronic obstructive pulmonary disease) ([place])   . Cough secondary to angiotensin converting enzyme inhibitor (ACE-I)   . Diabetes mellitus without complication ([place])   . DVT (deep venous thrombosis) ([place])   . GERD (gastroesophageal reflux disease)   . Headache(784.0)    Hx:  of  . History of blood transfusion    "I've had 2; don't know what it was related to" (11/09/2012)  . HLD (hyperlipidemia)   . HTN (hypertension)   . Iron deficiency anemia   . Microcytic anemia   . Peripheral vascular disease ([place])   . Rotator cuff injury    "left arm; never repaired" (11/09/2012)  . Shortness of breath    "can come on at anytime" (11/09/2012)       Review of Systems Pertinent positives and negatives in the history of present illness.     Objective:   Physical Exam  Constitutional: He is oriented to person, place, and time. He appears well-developed and well-nourished. He does not have a sickly appearance. No distress.  HENT:  Right Ear: Hearing, tympanic membrane and ear canal normal.  Left Ear: Hearing, tympanic membrane and ear canal normal.  Nose: Nose normal.  Mouth/Throat: Uvula is midline, oropharynx is clear and moist and mucous membranes are normal.  Eyes: Conjunctivae and EOM are normal. Pupils are equal, round, and reactive to light.  Neck: Normal range of motion. Neck supple. No JVD present. No thyromegaly present.  Cardiovascular: Normal rate, regular rhythm and normal pulses.   Pulmonary/Chest: Effort normal and breath sounds normal.  Abdominal: Soft. Normal appearance and bowel sounds are normal. There is no tenderness. There is no rigidity, no rebound and no CVA tenderness.  Musculoskeletal:  Right hip: Normal.       Lumbar back: Normal. He exhibits no tenderness.  Unable to reproduce pain during exam.  Negative straight leg raise  Lymphadenopathy:    He has no cervical adenopathy.  Neurological: He is alert and oriented to person, place, and time. He has normal strength and normal reflexes. No cranial nerve deficit or sensory deficit. He displays a negative Romberg sign. Coordination and gait normal. GCS eye subscore is 4. GCS verbal subscore is 5. GCS motor subscore is 6.  Normal finger to nose  Skin: Skin is warm and dry. No rash  noted. No pallor.  Psychiatric: He has a normal mood and affect. His speech is normal and behavior is normal. Thought content normal. Cognition and memory are normal.   ROS BP 108/64 (BP Location: Right Arm, Patient Position: Standing, Cuff Size: Normal)   Pulse 64   Wt 235 lb 9.6 oz (106.9 kg)   BMI 32.86 kg/m       Assessment & Plan:  Acute right-sided low back pain without sciatica - Plan: Urinalysis Dipstick  Episode of dizziness  Nausea without vomiting  Discussed that his neurological exam is normal, orthostatic vitals are normal and the dizziness appears to have resolved. Recommend that he stay well hydrated, change positions slowly and stop taking Oxycodone.  Urinalysis dipstick is normal. Urinary frequency was one day and resolved.  Suspect narcotic pain medication may have caused mild nausea. He will stop taking this.  Recommend trying heat and topical analgesic for right low back pain. He may also try Tramadol that he has at home. Discussed using a pillow between his legs when laying on his side.  Plan to have him call me in 2 days, Friday, to discuss how he is doing. Consider repeat imaging to low back if no improvement. Reviewed labs and record from ED visit and also reviewed Lumbar MRI from 2015 which showed moderate spinal stenosis and arthritis to L3-4 and L4-5 Spent a minimum of 25 minutes with patient and at least 50% in counseling and coordination of care.

## 2016-04-07 NOTE — Patient Instructions (Signed)
Try the Tramadol for your low back pain. Sleep with a pillow between your knees when laying on your side. Use heat to the low back. Call me Friday and let me know how you are doing.  Stay well hydrated and change positions slowly. If the dizziness gets worse let me know .  Most likely the Oxycodone caused the nausea so do not take any more of this.

## 2016-04-13 ENCOUNTER — Telehealth: Payer: Self-pay

## 2016-04-13 MED ORDER — TRAMADOL HCL 50 MG PO TABS: 50.0000 mg | ORAL_TABLET | Freq: Four times a day (QID) | ORAL | 0 refills | Status: DC | PRN

## 2016-04-13 NOTE — Telephone Encounter (Signed)
Called in med to [facility]

## 2016-04-13 NOTE — Telephone Encounter (Signed)
Pt states he needs a refill of tramadol 50mg . Please call into to [facility]. [phone] Kitchen Victorino December

## 2016-04-13 NOTE — Telephone Encounter (Signed)
Ok to refill 

## 2016-04-26 ENCOUNTER — Encounter: Payer: Self-pay | Admitting: Gastroenterology

## 2016-04-30 ENCOUNTER — Encounter: Payer: Self-pay | Admitting: Family Medicine

## 2016-04-30 ENCOUNTER — Other Ambulatory Visit: Payer: Self-pay | Admitting: Family Medicine

## 2016-04-30 ENCOUNTER — Ambulatory Visit (INDEPENDENT_AMBULATORY_CARE_PROVIDER_SITE_OTHER): Payer: PPO | Admitting: Family Medicine

## 2016-04-30 VITALS — BP 120/80 | HR 76 | Temp 98.4°F | Resp 16 | Wt 222.0 lb

## 2016-04-30 DIAGNOSIS — Z87891 Personal history of nicotine dependence: Secondary | ICD-10-CM

## 2016-04-30 DIAGNOSIS — D649 Anemia, unspecified: Secondary | ICD-10-CM | POA: Diagnosis not present

## 2016-04-30 DIAGNOSIS — D509 Iron deficiency anemia, unspecified: Secondary | ICD-10-CM

## 2016-04-30 DIAGNOSIS — R05 Cough: Secondary | ICD-10-CM

## 2016-04-30 DIAGNOSIS — R059 Cough, unspecified: Secondary | ICD-10-CM

## 2016-04-30 LAB — CBC WITH DIFFERENTIAL/PLATELET
Basophils Absolute: 49 cells/uL (ref 0–200)
Basophils Relative: 1 %
Eosinophils Absolute: 343 cells/uL (ref 15–500)
Eosinophils Relative: 7 %
HCT: 33.3 % — ABNORMAL LOW (ref 38.5–50.0)
Hemoglobin: 10.3 g/dL — ABNORMAL LOW (ref 13.2–17.1)
Lymphocytes Relative: 21 %
Lymphs Abs: 1029 cells/uL (ref 850–3900)
MCH: 20.6 pg — ABNORMAL LOW (ref 27.0–33.0)
MCHC: 30.9 g/dL — ABNORMAL LOW (ref 32.0–36.0)
MCV: 66.7 fL — ABNORMAL LOW (ref 80.0–100.0)
MPV: 8.9 fL (ref 7.5–12.5)
Monocytes Absolute: 588 cells/uL (ref 200–950)
Monocytes Relative: 12 %
Neutro Abs: 2891 cells/uL (ref 1500–7800)
Neutrophils Relative %: 59 %
Platelets: 243 10*3/uL (ref 140–400)
RBC: 4.99 MIL/uL (ref 4.20–5.80)
RDW: 17.1 % — ABNORMAL HIGH (ref 11.0–15.0)
WBC: 4.9 10*3/uL (ref 4.0–10.5)

## 2016-04-30 MED ORDER — AZITHROMYCIN 250 MG PO TABS: ORAL_TABLET | ORAL | 0 refills | Status: DC

## 2016-04-30 MED ORDER — BENZONATATE 200 MG PO CAPS: 200.0000 mg | ORAL_CAPSULE | Freq: Two times a day (BID) | ORAL | 0 refills | Status: DC | PRN

## 2016-04-30 MED ORDER — FLUTICASONE PROPIONATE 50 MCG/ACT NA SUSP: 2.0000 | Freq: Every day | NASAL | 6 refills | Status: DC

## 2016-04-30 NOTE — Progress Notes (Signed)
Subjective:  Joel [name] is a 76 y.o. male who presents for a 1 week history of rhinorrhea, nasal congestion, and cough that is occasionally productive of greenish-yellowish sputum, also reports mild wheezing at night. States he occasionally gags due to severe cough. He has used his albuterol inhaler on a couple of occasions. Reports history of COPD and former tobacco abuse. No recent antibiotics.  Denies history of bronchitis or pneumonia.   Denies fever, chills, ear pain, sinus pain, chest pain, palpitations, shortness of breath, abdominal pain, N/V/D.   Treatment to date: cough suppressants and decongestants.  Denies sick contacts.  No other aggravating or relieving factors.  No other c/o.  ROS as in subjective.   Objective: Vitals:   04/30/16 1145  BP: 120/80  Pulse: 76  Resp: 16  Temp: 98.4 F (36.9 C)    General appearance: Alert, WD/WN, no distress, mildly ill appearing                             Skin: warm, no rash                           Head: no sinus tenderness                            Eyes: conjunctiva normal, corneas clear, PERRLA                            Ears: pearly TMs, external ear canals normal                          Nose: septum midline, turbinates swollen, with erythema and clear discharge             Mouth/throat: MMM, tongue normal, mild pharyngeal erythema                           Neck: supple, no adenopathy, no thyromegaly, nontender                          Heart: RRR, normal S1, S2, no murmurs                         Lungs: CTA bilaterally, no wheezes, rales, or rhonchi      Assessment: Cough  History of tobacco abuse  Iron deficiency anemia, unspecified iron deficiency anemia type - Plan: CBC with Differential/Platelet   Plan: Discussed diagnosis and treatment of URI and cough. Plan to treat with Z-pack and Tessalon. Will also prescribe flonase  to use for nasal congestion and drainage.    Suggested symptomatic OTC remedies.Nasal  saline spray for congestion.  Tylenol or Ibuprofen OTC for fever and malaise.  Call/return if he worsens or is not back to baseline after completing the antibiotic.    He is due for a repeat CBC due to anemia. Will have this done today.

## 2016-05-03 ENCOUNTER — Other Ambulatory Visit: Payer: PPO

## 2016-05-03 LAB — IRON,TIBC AND FERRITIN PANEL
%SAT: 11 % — ABNORMAL LOW (ref 15–60)
Ferritin: 294 ng/mL (ref 20–380)
Iron: 26 ug/dL — ABNORMAL LOW (ref 50–180)
TIBC: 242 ug/dL — ABNORMAL LOW (ref 250–425)

## 2016-05-05 ENCOUNTER — Other Ambulatory Visit: Payer: Self-pay | Admitting: Family Medicine

## 2016-05-05 DIAGNOSIS — D649 Anemia, unspecified: Secondary | ICD-10-CM

## 2016-05-11 ENCOUNTER — Emergency Department (HOSPITAL_COMMUNITY): Payer: PPO

## 2016-05-11 ENCOUNTER — Encounter (HOSPITAL_COMMUNITY): Payer: Self-pay | Admitting: Emergency Medicine

## 2016-05-11 ENCOUNTER — Emergency Department (HOSPITAL_COMMUNITY)
Admission: EM | Admit: 2016-05-11 | Discharge: 2016-05-11 | Disposition: A | Discharge status: Home / Self Care | Payer: PPO | Attending: Emergency Medicine | Admitting: Emergency Medicine

## 2016-05-11 DIAGNOSIS — Z87891 Personal history of nicotine dependence: Secondary | ICD-10-CM | POA: Insufficient documentation

## 2016-05-11 DIAGNOSIS — Z8546 Personal history of malignant neoplasm of prostate: Secondary | ICD-10-CM | POA: Insufficient documentation

## 2016-05-11 DIAGNOSIS — Z7982 Long term (current) use of aspirin: Secondary | ICD-10-CM | POA: Diagnosis not present

## 2016-05-11 DIAGNOSIS — Z7984 Long term (current) use of oral hypoglycemic drugs: Secondary | ICD-10-CM | POA: Diagnosis not present

## 2016-05-11 DIAGNOSIS — J449 Chronic obstructive pulmonary disease, unspecified: Secondary | ICD-10-CM | POA: Insufficient documentation

## 2016-05-11 DIAGNOSIS — R0789 Other chest pain: Secondary | ICD-10-CM | POA: Insufficient documentation

## 2016-05-11 DIAGNOSIS — I10 Essential (primary) hypertension: Secondary | ICD-10-CM | POA: Insufficient documentation

## 2016-05-11 DIAGNOSIS — Z79899 Other long term (current) drug therapy: Secondary | ICD-10-CM | POA: Diagnosis not present

## 2016-05-11 DIAGNOSIS — Z955 Presence of coronary angioplasty implant and graft: Secondary | ICD-10-CM | POA: Insufficient documentation

## 2016-05-11 DIAGNOSIS — E119 Type 2 diabetes mellitus without complications: Secondary | ICD-10-CM | POA: Diagnosis not present

## 2016-05-11 DIAGNOSIS — I2510 Atherosclerotic heart disease of native coronary artery without angina pectoris: Secondary | ICD-10-CM | POA: Diagnosis not present

## 2016-05-11 DIAGNOSIS — R079 Chest pain, unspecified: Secondary | ICD-10-CM | POA: Diagnosis not present

## 2016-05-11 LAB — BASIC METABOLIC PANEL
Anion gap: 8 (ref 5–15)
BUN: 11 mg/dL (ref 6–20)
CO2: 27 mmol/L (ref 22–32)
Calcium: 9.3 mg/dL (ref 8.9–10.3)
Chloride: 103 mmol/L (ref 101–111)
Creatinine, Ser: 1.25 mg/dL — ABNORMAL HIGH (ref 0.61–1.24)
GFR calc Af Amer: >60 mL/min (ref >60)
GFR calc non Af Amer: 54 mL/min — ABNORMAL LOW (ref >60)
Glucose, Bld: 95 mg/dL (ref 65–99)
Potassium: 4.1 mmol/L (ref 3.5–5.1)
Sodium: 138 mmol/L (ref 135–145)

## 2016-05-11 LAB — CBC
HCT: 32.6 % — ABNORMAL LOW (ref 39.0–52.0)
Hemoglobin: 9.9 g/dL — ABNORMAL LOW (ref 13.0–17.0)
MCH: 20.6 pg — ABNORMAL LOW (ref 26.0–34.0)
MCHC: 30.4 g/dL (ref 30.0–36.0)
MCV: 67.9 fL — ABNORMAL LOW (ref 78.0–100.0)
Platelets: 216 10*3/uL (ref 150–400)
RBC: 4.8 MIL/uL (ref 4.22–5.81)
RDW: 16.8 % — ABNORMAL HIGH (ref 11.5–15.5)
WBC: 5.1 10*3/uL (ref 4.0–10.5)

## 2016-05-11 LAB — I-STAT TROPONIN, ED: Troponin i, poc: 0 ng/mL (ref 0.00–0.08)

## 2016-05-11 LAB — TROPONIN I: Troponin I: <0.03 ng/mL (ref <0.03)

## 2016-05-11 NOTE — ED Provider Notes (Signed)
[facility] DEPT Provider Note   CSN: [id number] Arrival date & time: 05/11/16  1632     History   Chief Complaint Chief Complaint  Patient presents with  . Chest Pain    HPI Joel [name] is a 76 y.o. male.  Pt presents to the ED today with left sided chest twinges.  He does have a hx of CAD with stents, but does not remember if this feels similar.  The pt said that he took 1 nitro for it which did not help.  He then took some maalox.  The pt said that the pain went away, but now the twinges are back.  The pt denies sob.      Past Medical History:  Diagnosis Date  . Angina   . Atypical chest pain   . CAD (coronary artery disease)    A. Negative MV 04/2010;  B. Inadequate Stress Echo 05/2011;  C. 05/29/11 Cath: - Med Rx;  D. 05/2012 Cath/PCI: LM nl, LAD 40p, LCX ectatic, OM1 121m, OM2 90p (3.5x16 Veriflex BMS), RCA dominant 40p/d, PDA 50p, EF 55-65%.  . Cancer ([place])    Hx: of prostate  . COPD (chronic obstructive pulmonary disease) ([place])   . Cough secondary to angiotensin converting enzyme inhibitor (ACE-I)   . Diabetes mellitus without complication ([place])   . DVT (deep venous thrombosis) ([place])   . GERD (gastroesophageal reflux disease)   . Headache(784.0)    Hx: of  . History of blood transfusion    "I've had 2; don't know what it was related to" (11/09/2012)  . HLD (hyperlipidemia)   . HTN (hypertension)   . Iron deficiency anemia   . Microcytic anemia   . Peripheral vascular disease ([place])   . Rotator cuff injury    "left arm; never repaired" (11/09/2012)  . Shortness of breath    "can come on at anytime" (11/09/2012)    Patient Active Problem List   Diagnosis Date Noted  . Prediabetes 03/22/2016  . Chest pain 09/10/2014  . Pain in the chest 12/15/2013  . PVD (peripheral vascular disease) with claudication ([place]) 11/15/2012  . Aftercare following surgery of the circulatory system, [place] 11/15/2012  . Chest pain, localized 11/10/2012  . Hypokalemia 11/10/2012    . Bradycardia 11/09/2012  . Unstable angina ([place]) 06/01/2012  . Thrombocytopenia ([place]) 06/01/2012  . Microcytic anemia   . Angina effort ([place]) 05/18/2012  . Atherosclerosis of native arteries of the extremities with intermittent claudication 06/16/2011  . Stable angina ([place]) 06/11/2011  . CAD (coronary artery disease)   . Chest pain, atypical 05/30/2011  . Iron deficiency anemia 10/03/2009  . PVD 10/03/2009  . DIVERTICULOSIS OF COLON 10/03/2009  . WEIGHT LOSS, ABNORMAL 10/03/2009  . CHEST PAIN UNSPECIFIED 09/13/2008  . Headache(784.0) 08/19/2008  . TIA 06/05/2008  . SHOULDER PAIN, LEFT 10/11/2007  . COUGH DUE TO ACE INHIBITORS 05/07/2007  . History of tobacco use disorder 04/25/2007  . HYPERLIPIDEMIA 04/21/2007  . Essential hypertension 04/21/2007  . GERD 04/21/2007    Past Surgical History:  Procedure Laterality Date  . CARDIOVASCULAR STRESS TEST  Aug. 2014  . CATARACT EXTRACTION W/ INTRAOCULAR LENS IMPLANT Right ~ 2008  . CIRCUMCISION    . CORONARY ANGIOPLASTY WITH STENT PLACEMENT  2014   "1" (11/09/2012)  . ILIAC ARTERY ANEURYSM REPAIR    . LEFT HEART CATH  05-31-12  . LEFT HEART CATHETERIZATION WITH CORONARY ANGIOGRAM N/A 06/01/2011   Procedure: LEFT HEART CATHETERIZATION WITH CORONARY ANGIOGRAM;  Surgeon: Hillary Bow,  MD;  Location: [facility] CATH LAB;  Service: Cardiovascular;  Laterality: N/A;  . LEFT HEART CATHETERIZATION WITH CORONARY ANGIOGRAM N/A 05/31/2012   Procedure: LEFT HEART CATHETERIZATION WITH CORONARY ANGIOGRAM;  Surgeon: Peter M Martinique, MD;  [facility] LAB;  Service: Cardiovascular;  Laterality: N/A;  . LEFT HEART CATHETERIZATION WITH CORONARY ANGIOGRAM N/A 08/01/2013   Procedure: LEFT HEART CATHETERIZATION WITH CORONARY ANGIOGRAM;  Surgeon: Burnell Blanks, MD;  Location: [facility] CATH LAB;  Service: Cardiovascular;  Laterality: N/A;  . PERCUTANEOUS CORONARY STENT INTERVENTION (PCI-S)  05/31/2012   Procedure: PERCUTANEOUS CORONARY STENT INTERVENTION  (PCI-S);  Surgeon: Peter M Martinique, MD;  Location: [facility] CATH LAB;  Service: Cardiovascular;;  . PR VEIN BYPASS GRAFT,AORTO-FEM-POP Left 10/01/2009   left below knee popliteal artery to posterior tibial artery  . SHOULDER ARTHROSCOPY WITH OPEN ROTATOR CUFF REPAIR AND DISTAL CLAVICLE ACROMINECTOMY Left 01/12/2013   Procedure: LEFT SHOULDER ARTHROSCOPY WITH DEBRIDEMENT OPEN DISTAL CLAVICLE RESECTION ,acromioplastyAND ROTATOR CUFF REPAIR;  Surgeon: Yvette Rack., MD;  Location: [facility];  Service: Orthopedics;  Laterality: Left;       Home Medications    Prior to Admission medications   Medication Sig Start Date End Date Taking? Authorizing Provider  albuterol (PROVENTIL HFA;VENTOLIN HFA) 108 (90 BASE) MCG/ACT inhaler Inhale 2 puffs into the lungs every 6 (six) hours as needed for wheezing or shortness of breath.    Historical Provider, MD  amLODipine (NORVASC) 10 MG tablet Take 1 tablet (10 mg total) by mouth daily. 05/04/14   Drenda Freeze, MD  aspirin EC 81 MG tablet Take 81 mg by mouth daily.     Historical Provider, MD  azithromycin (ZITHROMAX Z-PAK) 250 MG tablet Take 2 tablets on day 1, then 1 tablet on days 2-5. 04/30/16   Girtha Rm, NP  benzonatate (TESSALON) 200 MG capsule Take 1 capsule (200 mg total) by mouth 2 (two) times daily as needed for cough. 04/30/16   Girtha Rm, NP  ferrous sulfate 325 (65 FE) MG tablet Take 325 mg by mouth daily with breakfast.    Historical Provider, MD  fluticasone (FLONASE) 50 MCG/ACT nasal spray Place 2 sprays into both nostrils daily. 04/30/16   Girtha Rm, NP  gabapentin (NEURONTIN) 600 MG tablet Take 600 mg by mouth at bedtime.    Historical Provider, MD  losartan-hydrochlorothiazide Konrad Penta) 100-25 MG tablet take 1 tablet by mouth once daily 03/08/16   Burnell Blanks, MD  meloxicam (MOBIC) 15 MG tablet Take 15 mg by mouth daily.    Historical Provider, MD  metFORMIN (GLUCOPHAGE) 500 MG tablet Take 500 mg by mouth 2 (two) times  daily with a meal. Reported on 08/27/2015    Historical Provider, MD  nitroGLYCERIN (NITROSTAT) 0.4 MG SL tablet Place 1 tablet (0.4 mg total) under the tongue every 5 (five) minutes as needed for chest pain. 12/08/15   Imogene Burn, PA-C  omeprazole (PRILOSEC) 20 MG capsule Take 20 mg by mouth 2 (two) times daily before a meal.    Historical Provider, MD  oxyCODONE-acetaminophen (PERCOCET) 5-325 MG tablet Take 1 tablet by mouth every 4 (four) hours as needed for moderate pain. Patient not taking: Reported on 04/30/2016 [id number]   Delora Fuel, MD  pravastatin (PRAVACHOL) 40 MG tablet Take 40 mg by mouth every evening.    Historical Provider, MD  tamsulosin (FLOMAX) 0.4 MG CAPS capsule Take 0.4 mg by mouth daily after supper.     Historical Provider, MD  traMADol Veatrice Bourbon) 50  MG tablet Take 1 tablet (50 mg total) by mouth every 6 (six) hours as needed for moderate pain. Patient not taking: Reported on 04/30/2016 04/13/16   Girtha Rm, NP    Family History Family History  Problem Relation Age of Onset  . Breast cancer Mother     deceased  . Hyperlipidemia Mother   . Hypertension Mother   . COPD Father     deceased  . Cancer Father   . Hyperlipidemia Father   . Hypertension Father   . Hypertension Sister   . Cancer Brother   . Hypertension Brother   . Hypertension Daughter   . Hypertension Son     Social History Social History  Substance Use Topics  . Smoking status: Former Smoker    Packs/day: 1.00    Years: 55.00    Types: Cigarettes    Quit date: 05/26/2011  . Smokeless tobacco: Never Used  . Alcohol use No     Allergies   Atenolol; Imdur [isosorbide mononitrate]; Lisinopril; Topiramate; and Tramadol-acetaminophen   Review of Systems Review of Systems  Cardiovascular: Positive for chest pain.  All other systems reviewed and are negative.    Physical Exam Updated Vital Signs BP 127/70   Pulse (!) 48   Temp 98 F (36.7 C) (Oral)   Resp 15   SpO2 97%    Physical Exam  Constitutional: He is oriented to person, place, and time. He appears well-developed and well-nourished.  HENT:  Head: Normocephalic and atraumatic.  Right Ear: External ear normal.  Left Ear: External ear normal.  Nose: Nose normal.  Mouth/Throat: Oropharynx is clear and moist.  Eyes: Conjunctivae and EOM are normal. Pupils are equal, round, and reactive to light.  Neck: Normal range of motion. Neck supple.  Cardiovascular: Normal rate, regular rhythm, normal heart sounds and intact distal pulses.   Pulmonary/Chest: Effort normal and breath sounds normal.  Abdominal: Soft. Bowel sounds are normal.  Musculoskeletal: Normal range of motion.  Neurological: He is alert and oriented to person, place, and time.  Skin: Skin is warm and dry. Capillary refill takes less than 2 seconds.  Psychiatric: He has a normal mood and affect. His behavior is normal. Judgment and thought content normal.  Nursing note and vitals reviewed.    ED Treatments / Results  Labs (all labs ordered are listed, but only abnormal results are displayed) Labs Reviewed  BASIC METABOLIC PANEL - Abnormal; Notable for the following:       Result Value   Creatinine, Ser 1.25 (*)    GFR calc non Af Amer 54 (*)    All other components within normal limits  CBC - Abnormal; Notable for the following:    Hemoglobin 9.9 (*)    HCT 32.6 (*)    MCV 67.9 (*)    MCH 20.6 (*)    RDW 16.8 (*)    All other components within normal limits  TROPONIN I  I-STAT TROPOININ, ED    EKG  EKG Interpretation  Date/Time:  Tuesday May 11 2016 16:37:55 EST Ventricular Rate:  59 PR Interval:  230 QRS Duration: 98 QT Interval:  414 QTC Calculation: 409 R Axis:   47 Text Interpretation:  Sinus bradycardia with 1st degree A-V block Minimal voltage criteria for LVH, may be normal variant Borderline ECG Confirmed by Gilford Raid MD, Areatha Kalata ([id number]) on 05/11/2016 6:40:22 PM       Radiology Dg Chest 2 View  Result  Date: 05/11/2016 CLINICAL DATA:  Acute onset of  sharp generalized chest pain. Headache. Initial encounter. EXAM: CHEST  2 VIEW COMPARISON:  Chest radiograph performed 10/06/2015 FINDINGS: The lungs are well-aerated. Mild peribronchial thickening is seen. There is no evidence of pleural effusion or pneumothorax. The heart is normal in size; the mediastinal contour is within normal limits. No acute osseous abnormalities are seen. IMPRESSION: Mild peribronchial thickening noted.  Lungs otherwise grossly clear. Electronically Signed   By: Garald Balding M.D.   On: 05/11/2016 17:21    Procedures Procedures (including critical care time)  Medications Ordered in ED Medications - No data to display   Initial Impression / Assessment and Plan / ED Course  I have reviewed the triage vital signs and the nursing notes.  Pertinent labs & imaging results that were available during my care of the patient were reviewed by me and considered in my medical decision making (see chart for details).    Pt has 2 negative troponins.  His cp is atypical.  He is stable for d/c.  He knows to f/u with Dr. Julianne Handler.  Final Clinical Impressions(s) / ED Diagnoses   Final diagnoses:  Atypical chest pain    New Prescriptions New Prescriptions   No medications on file     Isla Pence, MD 05/11/16 2126

## 2016-05-11 NOTE — ED Triage Notes (Signed)
Pt states "i was shopping and I started having chest pain, it felt like acid reflux. I took maalox at 3pm. I got home and I started having sharp pains in my left rib area. I took one nitro, waited five minutes, took another, and it didn't take away my pain, on the way here the pain lighted up a little but its still squeezing."

## 2016-05-12 ENCOUNTER — Encounter: Payer: Self-pay | Admitting: Physician Assistant

## 2016-05-12 NOTE — Progress Notes (Addendum)
Dana precharted but was sick. I saw patient

## 2016-05-13 ENCOUNTER — Telehealth: Payer: Self-pay | Admitting: Family Medicine

## 2016-05-13 ENCOUNTER — Ambulatory Visit (INDEPENDENT_AMBULATORY_CARE_PROVIDER_SITE_OTHER): Payer: PPO | Admitting: Physician Assistant

## 2016-05-13 ENCOUNTER — Encounter: Payer: Self-pay | Admitting: Physician Assistant

## 2016-05-13 VITALS — BP 132/58 | HR 68 | Ht 71.0 in | Wt 225.0 lb

## 2016-05-13 DIAGNOSIS — R0789 Other chest pain: Secondary | ICD-10-CM | POA: Diagnosis not present

## 2016-05-13 DIAGNOSIS — N182 Chronic kidney disease, stage 2 (mild): Secondary | ICD-10-CM | POA: Diagnosis not present

## 2016-05-13 DIAGNOSIS — I10 Essential (primary) hypertension: Secondary | ICD-10-CM

## 2016-05-13 DIAGNOSIS — I2510 Atherosclerotic heart disease of native coronary artery without angina pectoris: Secondary | ICD-10-CM

## 2016-05-13 DIAGNOSIS — Z9861 Coronary angioplasty status: Secondary | ICD-10-CM

## 2016-05-13 MED ORDER — AZITHROMYCIN 250 MG PO TABS: ORAL_TABLET | ORAL | 0 refills | Status: DC

## 2016-05-13 NOTE — Telephone Encounter (Signed)
Pt called and stated he would like another round of medication. He states he is not yet all clear of symptoms. He states he still has green stuff. [facility] uses [facility] on [address] and can be reached at [phone].

## 2016-05-13 NOTE — Progress Notes (Signed)
Cardiology Office Note    Date:  05/13/2016   ID:  Joel [name], DOB January 09, 1941, MRN [id number]  PCP:  [name], NP  Cardiologist: Dr. Angelena Form  No chief complaint on file.   History of Present Illness:  Joel [name] is a 76 y.o. male with history of HTN, HLD, CAD, PAD, DM.Cardiac cath February 2014 with moderate LAD and RCA disease, severe OM2 stenosis treated with bare metal stent. Admitted to [facility] 11/09/12 with chest pain. Ruled out for MI. Lexiscan stress myoview 11/16/12 without ischemia. Normal LVEF.  Cardiac cath 08/01/13 with patent stent Circumflex and mild to moderate disease in the LAD and RCA. His PAD is followed in [facility] by Dr. Scot Dock. Seen in the ED by Dr. Ron Parker 12/15/13 with c/o chest pain. Negative troponin and discharged home. He was seen here 03/04/14 and had c/o chest pain after working on his car but quickly resolved. He was seen in the ED 09/10/14 with atypical chest pain. Troponin was negative x 2 and EKG was unchanged.    Last saw Dr.McAlhany 04/2015 with chronic chest pain that did not sound cardiac. Most likely GERD versus musculoskeletal pain. Medical management recommended.   The patient was in the emergency room with chest pain 06/2015 07/2015 and most recently 09/2015 at which time troponin was negative and EKG and chest x-ray unremarkable.  About 3pm after shopping he developed sharp shooting chest pains and twinges.Went to the emergency room. EKG unchanged. Troponins were negative. Crt 1.25 Hgb 9.9 But he was just started on iron by his primary care for iron deficiency anemia. Patient denies any chest pressure, tightness, dyspnea, dyspnea on exertion. 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 He says he just has mild sharp shooting pains on occasion and wants to make sure his hearts okay. He does get dizziness on occasion after taking his medications and not eating. When he eats the dizziness resolves. He is on metformin.       Past Medical History:    Diagnosis Date  . Atypical chest pain   . CAD (coronary artery disease)    a. Multiple prior evaluations then 05/2012 s/p BMS to OM2. b. Nuc 11/2012 wnl. c. Cath 2015: patent stent, moderate LAD/RCA dz, treated medically.  . Cancer ([place])    Hx: of prostate  . CKD (chronic kidney disease), stage II   . COPD (chronic obstructive pulmonary disease) ([place])   . Cough secondary to angiotensin converting enzyme inhibitor (ACE-I)   . Diabetes mellitus without complication ([place])   . DVT (deep venous thrombosis) ([place])   . GERD (gastroesophageal reflux disease)   . Headache(784.0)    Hx: of  . History of blood transfusion    "I've had 2; don't know what it was related to" (11/09/2012)  . HLD (hyperlipidemia)   . HTN (hypertension)   . Iron deficiency anemia   . Microcytic anemia   . Peripheral vascular disease ([place])    a. L pop-tibial bypass 2011, followed by [facility].  . Rotator cuff injury    "left arm; never repaired" (11/09/2012)    Past Surgical History:  Procedure Laterality Date  . CARDIOVASCULAR STRESS TEST  Aug. 2014  . CATARACT EXTRACTION W/ INTRAOCULAR LENS IMPLANT Right ~ 2008  . CIRCUMCISION    . CORONARY ANGIOPLASTY WITH STENT PLACEMENT  2014   "1" (11/09/2012)  . ILIAC ARTERY ANEURYSM REPAIR    . LEFT HEART CATH  05-31-12  . LEFT HEART CATHETERIZATION WITH CORONARY ANGIOGRAM N/A 06/01/2011  Procedure: LEFT HEART CATHETERIZATION WITH CORONARY ANGIOGRAM;  Surgeon: Hillary Bow, MD;  Location: [facility] CATH LAB;  Service: Cardiovascular;  Laterality: N/A;  . LEFT HEART CATHETERIZATION WITH CORONARY ANGIOGRAM N/A 05/31/2012   Procedure: LEFT HEART CATHETERIZATION WITH CORONARY ANGIOGRAM;  Surgeon: Peter M Martinique, MD;  Location: [facility] CATH LAB;  Service: Cardiovascular;  Laterality: N/A;  . LEFT HEART CATHETERIZATION WITH CORONARY ANGIOGRAM N/A 08/01/2013   Procedure: LEFT HEART CATHETERIZATION WITH CORONARY ANGIOGRAM;  Surgeon: Burnell Blanks, MD;  Location: [facility] CATH LAB;  Service:  Cardiovascular;  Laterality: N/A;  . PERCUTANEOUS CORONARY STENT INTERVENTION (PCI-S)  05/31/2012   Procedure: PERCUTANEOUS CORONARY STENT INTERVENTION (PCI-S);  Surgeon: Peter M Martinique, MD;  Location: [facility] CATH LAB;  Service: Cardiovascular;;  . PR VEIN BYPASS GRAFT,AORTO-FEM-POP Left 10/01/2009   left below knee popliteal artery to posterior tibial artery  . SHOULDER ARTHROSCOPY WITH OPEN ROTATOR CUFF REPAIR AND DISTAL CLAVICLE ACROMINECTOMY Left 01/12/2013   Procedure: LEFT SHOULDER ARTHROSCOPY WITH DEBRIDEMENT OPEN DISTAL CLAVICLE RESECTION ,acromioplastyAND ROTATOR CUFF REPAIR;  Surgeon: Yvette Rack., MD;  Location: [place];  Service: Orthopedics;  Laterality: Left;    Current Medications: Outpatient Medications Prior to Visit  Medication Sig Dispense Refill  . albuterol (PROVENTIL HFA;VENTOLIN HFA) 108 (90 BASE) MCG/ACT inhaler Inhale 2 puffs into the lungs every 6 (six) hours as needed for wheezing or shortness of breath.    Marland Kitchen amLODipine (NORVASC) 10 MG tablet Take 1 tablet (10 mg total) by mouth daily. 30 tablet 0  . aspirin EC 81 MG tablet Take 81 mg by mouth daily.     . ferrous sulfate 325 (65 FE) MG tablet Take 325 mg by mouth daily with breakfast.    . gabapentin (NEURONTIN) 600 MG tablet Take 600 mg by mouth at bedtime.    Marland Kitchen losartan-hydrochlorothiazide (HYZAAR) 100-25 MG tablet take 1 tablet by mouth once daily 90 tablet 1  . metFORMIN (GLUCOPHAGE) 500 MG tablet Take 500 mg by mouth 2 (two) times daily with a meal. Reported on 08/27/2015    . nitroGLYCERIN (NITROSTAT) 0.4 MG SL tablet Place 1 tablet (0.4 mg total) under the tongue every 5 (five) minutes as needed for chest pain. 25 tablet 2  . omeprazole (PRILOSEC) 20 MG capsule Take 20 mg by mouth 2 (two) times daily before a meal.    . pravastatin (PRAVACHOL) 40 MG tablet Take 40 mg by mouth every evening.    . tamsulosin (FLOMAX) 0.4 MG CAPS capsule Take 0.4 mg by mouth daily after supper.     . traMADol (ULTRAM) 50 MG tablet Take  1 tablet (50 mg total) by mouth every 6 (six) hours as needed for moderate pain. 30 tablet 0  . azithromycin (ZITHROMAX Z-PAK) 250 MG tablet Take 2 tablets on day 1, then 1 tablet on days 2-5. (Patient not taking: Reported on 05/13/2016) 6 each 0  . benzonatate (TESSALON) 200 MG capsule Take 1 capsule (200 mg total) by mouth 2 (two) times daily as needed for cough. (Patient not taking: Reported on 05/13/2016) 20 capsule 0  . fluticasone (FLONASE) 50 MCG/ACT nasal spray Place 2 sprays into both nostrils daily. (Patient not taking: Reported on 05/13/2016) 16 g 6  . meloxicam (MOBIC) 15 MG tablet Take 15 mg by mouth daily.    Marland Kitchen oxyCODONE-acetaminophen (PERCOCET) 5-325 MG tablet Take 1 tablet by mouth every 4 (four) hours as needed for moderate pain. (Patient not taking: Reported on 04/30/2016) 6 tablet 0   No facility-administered medications  prior to visit.      Allergies:   Atenolol; Imdur [isosorbide mononitrate]; Lisinopril; Topiramate; and Tramadol-acetaminophen   Social History   Social History  . Marital status: Widowed    Spouse name: N/A  . Number of children: N/A  . Years of education: N/A   Occupational History  . Retired    Social History Main Topics  . Smoking status: Former Smoker    Packs/day: 1.00    Years: 55.00    Types: Cigarettes    Quit date: 05/26/2011  . Smokeless tobacco: Never Used  . Alcohol use No  . Drug use: No  . Sexual activity: Not Asked     Comment: 11/09/2012 "I don't have to answer questions about sex"   Other Topics Concern  . None   Social History Narrative   Single. Retired. 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  Lives in [place] by himself.     Family History:  The patient's family history includes Breast cancer in his mother; COPD in his father; Cancer in his brother and father; Hyperlipidemia in his father and mother; Hypertension in his brother, daughter, father, mother, sister, and son.   ROS:   Please see the history of present illness.    Review of  Systems  Constitution: Negative.  HENT: Negative.   Cardiovascular: Positive for chest pain.  Respiratory: Negative.   Endocrine: Negative.   Hematologic/Lymphatic: Negative.   Musculoskeletal: Positive for back pain.  Gastrointestinal: Negative.   Genitourinary: Negative.   Neurological: Positive for dizziness and headaches.   All other systems reviewed and are negative.   PHYSICAL EXAM:   VS:  BP (!) 132/58   Pulse 68   Ht 5\' 11"  (1.803 m)   Wt 225 lb (102.1 kg)   SpO2 95%   BMI 31.38 kg/m   Physical Exam  GEN: Well nourished, well developed, in no acute distress  Neck: no JVD, carotid bruits, or masses Cardiac:RRR; no murmurs, rubs, or gallops  Respiratory:  clear to auscultation bilaterally, normal work of breathing GI: soft, nontender, nondistended, + BS Ext: without cyanosis, clubbing, or edema, Good distal pulses bilaterally Psych: euthymic mood, full affect  Wt Readings from Last 3 Encounters:  05/13/16 225 lb (102.1 kg)  04/30/16 222 lb (100.7 kg)  04/07/16 235 lb 9.6 oz (106.9 kg)      Studies/Labs Reviewed:   EKG:  EKG is not ordered today.  EKG reviewed from the emergency room 05/11/16 sinus bradycardia 59 bpm with LVH, no acute change. Recent Labs: 12/18/2015: TSH 3.53 04/04/2016: ALT 18 05/11/2016: BUN 11; Creatinine, Ser 1.25; Hemoglobin 9.9; Platelets 216; Potassium 4.1; Sodium 138   Lipid Panel    Component Value Date/Time   CHOL 164 02/06/2013 0741   TRIG 122.0 02/06/2013 0741   HDL 34.30 (L) 02/06/2013 0741   CHOLHDL 5 02/06/2013 0741   VLDL 24.4 02/06/2013 0741   LDLCALC 105 (H) 02/06/2013 0741    Additional studies/ records that were reviewed today include:   Cath 07/2013 Angiographic Findings:   Left main: No obstructive disease.     Left Anterior Descending Artery: Large caliber vessel that courses to the apex. The proximal and mid vessel is ectatic. There is diffuse 30-40% stenosis in the mid vessel. There are mild luminal  irregularities in the distal vessel.     Circumflex Artery: Large caliber vessel with two obtuse marginal branches. The first OM branch is small in caliber and is chronically occluded in the mid vessel. The second OM branch  is patent with patent stent, minimal restenosis. The distal segment of the OM branch has diffuse plaque.     Right Coronary Artery: Large caliber dominant vessel with proximal Shepherd's crook. The proximal vessel has diffuse 40% stenosis. The mid vessel is ectatic with 40% stenosis. The distal vessel has diffuse 50% stenosis. The PDA has 50% proximal stenosis followed by diffuse plaque.    Left Ventricular Angiogram: LVEF=65%.    Impression: 1. Triple vessel CAD with patent stent in the second OM branch.   2. Moderate non-obstructive disease in the LAD and RCA 3. Normal LV function   Recommendations: Continue medical management of CAD.            ASSESSMENT:    1. Atypical chest pain   2. CAD S/P percutaneous coronary angioplasty   3. Essential hypertension   4. CKD (chronic kidney disease), stage II      PLAN:  In order of problems listed above:  Atypical chest pain with no EKG changes and negative troponins. Pain is resolved. Patient has had similar symptoms in the past. No further workup at this time. F/u with Dr. Angelena Form in 6 months.  CAD asked cath in 2015 patent stent to the circumflex with mild to moderate disease in the LAD and RCA.  Essential hypertension controlled  CK D stable creatinine 1.25 yesterday earlier in December was 1.35.   Medication Adjustments/Labs and Tests Ordered: Current medicines are reviewed at length with the patient today.  Concerns regarding medicines are outlined above.  Medication changes, Labs and Tests ordered today are listed in the Patient Instructions below. There are no Patient Instructions on file for this visit.   Signed, Ermalinda Barrios, PA-C  05/13/2016 1:31 PM    [facility] [address], [place], [place]  [address] Phone: [phone]; Fax: [phone]

## 2016-05-13 NOTE — Telephone Encounter (Signed)
Pt was notified.  

## 2016-05-13 NOTE — Patient Instructions (Signed)
Medication Instructions:  NONE ORDERED  Labwork: NONE ORDERED  Testing/Procedures: NONE ORDERED  Follow-Up: Your physician wants you to follow-up in: 6 MONTHS WITH DR. Angelena Form   You will receive a reminder letter in the mail two months in advance. If you don't receive a letter, please call our office to schedule the follow-up appointment.   Any Other Special Instructions Will Be Listed Below (If Applicable).     If you need a refill on your cardiac medications before your next appointment, please call your pharmacy.

## 2016-05-13 NOTE — Telephone Encounter (Signed)
Ok to give him another round of antibiotics, Z-pack. He will need to come back in if he is not back to normal after day 10 of starting the next round.

## 2016-05-19 ENCOUNTER — Telehealth: Payer: Self-pay | Admitting: Family Medicine

## 2016-05-19 MED ORDER — AMLODIPINE BESYLATE 10 MG PO TABS: 10.0000 mg | ORAL_TABLET | Freq: Every day | ORAL | 5 refills | Status: DC

## 2016-05-19 NOTE — Telephone Encounter (Signed)
Please take care of this.  

## 2016-05-19 NOTE — Telephone Encounter (Signed)
Sent in med to pharmacy 

## 2016-05-19 NOTE — Telephone Encounter (Signed)
Pt called and states that he needs a refill on his amlodipine pt uses [facility], [place] and pt can be reached at [phone] and would like  A call when it is called in please

## 2016-05-26 DIAGNOSIS — R51 Headache: Secondary | ICD-10-CM | POA: Diagnosis not present

## 2016-05-28 ENCOUNTER — Ambulatory Visit (INDEPENDENT_AMBULATORY_CARE_PROVIDER_SITE_OTHER): Payer: PPO | Admitting: Medical

## 2016-05-28 ENCOUNTER — Encounter: Payer: Self-pay | Admitting: Medical

## 2016-05-28 VITALS — BP 130/84 | HR 86 | Temp 99.6°F | Wt 220.4 lb

## 2016-05-28 DIAGNOSIS — J449 Chronic obstructive pulmonary disease, unspecified: Secondary | ICD-10-CM | POA: Diagnosis not present

## 2016-05-28 DIAGNOSIS — J111 Influenza due to unidentified influenza virus with other respiratory manifestations: Secondary | ICD-10-CM

## 2016-05-28 DIAGNOSIS — R6889 Other general symptoms and signs: Secondary | ICD-10-CM

## 2016-05-28 DIAGNOSIS — R05 Cough: Secondary | ICD-10-CM

## 2016-05-28 DIAGNOSIS — R059 Cough, unspecified: Secondary | ICD-10-CM

## 2016-05-28 MED ORDER — OSELTAMIVIR PHOSPHATE 75 MG PO CAPS: 75.0000 mg | ORAL_CAPSULE | Freq: Two times a day (BID) | ORAL | 0 refills | Status: DC

## 2016-05-28 NOTE — Progress Notes (Signed)
Subjective:  Joel [name] is a 76 y.o. male who presents for 1 day hx/o cough, went on all night long.  Then started getting clear nasal drainage.  Today has had some loose stool.   Has some body aches, sometimes warm feeling, but no chills.    Not sure about fever.  No nausea, no vomiting.  No SOB.   No sore throat, no ear pain, no headache.   No sick contacts.  Nonsmoker.   Has COPD, doesn't use inhaler often, but did use a few times last night.  Using some Tessalon Perles last night.  He thinks his typical weight is 230lb.   No leg swelling.   No hx/o CHF.  No other aggravating or relieving factors.  No other c/o.  The following portions of the patient's history were reviewed and updated as appropriate: allergies, current medications, past medical history, past social history and problem list.  ROS as in subjective   Past Medical History:  Diagnosis Date  . Atypical chest pain   . CAD (coronary artery disease)    a. Multiple prior evaluations then 05/2012 s/p BMS to OM2. b. Nuc 11/2012 wnl. c. Cath 2015: patent stent, moderate LAD/RCA dz, treated medically.  . Cancer ([place])    Hx: of prostate  . CKD (chronic kidney disease), stage II   . COPD (chronic obstructive pulmonary disease) ([place])   . Cough secondary to angiotensin converting enzyme inhibitor (ACE-I)   . Diabetes mellitus without complication ([place])   . DVT (deep venous thrombosis) ([place])   . GERD (gastroesophageal reflux disease)   . Headache(784.0)    Hx: of  . History of blood transfusion    "I've had 2; don't know what it was related to" (11/09/2012)  . HLD (hyperlipidemia)   . HTN (hypertension)   . Iron deficiency anemia   . Microcytic anemia   . Peripheral vascular disease ([place])    a. L pop-tibial bypass 2011, followed by [facility].  . Rotator cuff injury    "left arm; never repaired" (11/09/2012)   Current Outpatient Prescriptions on File Prior to Visit  Medication Sig Dispense Refill  . albuterol (PROVENTIL  HFA;VENTOLIN HFA) 108 (90 BASE) MCG/ACT inhaler Inhale 2 puffs into the lungs every 6 (six) hours as needed for wheezing or shortness of breath.    Marland Kitchen amLODipine (NORVASC) 10 MG tablet Take 1 tablet (10 mg total) by mouth daily. 30 tablet 5  . aspirin EC 81 MG tablet Take 81 mg by mouth daily.     . ferrous sulfate 325 (65 FE) MG tablet Take 325 mg by mouth daily with breakfast.    . gabapentin (NEURONTIN) 600 MG tablet Take 600 mg by mouth at bedtime.    Marland Kitchen losartan-hydrochlorothiazide (HYZAAR) 100-25 MG tablet take 1 tablet by mouth once daily 90 tablet 1  . metFORMIN (GLUCOPHAGE) 500 MG tablet Take 500 mg by mouth 2 (two) times daily with a meal. Reported on 08/27/2015    . nitroGLYCERIN (NITROSTAT) 0.4 MG SL tablet Place 1 tablet (0.4 mg total) under the tongue every 5 (five) minutes as needed for chest pain. 25 tablet 2  . omeprazole (PRILOSEC) 20 MG capsule Take 20 mg by mouth 2 (two) times daily before a meal.    . pravastatin (PRAVACHOL) 40 MG tablet Take 40 mg by mouth every evening.    . tamsulosin (FLOMAX) 0.4 MG CAPS capsule Take 0.4 mg by mouth daily after supper.     . traMADol (ULTRAM) 50 MG  tablet Take 1 tablet (50 mg total) by mouth every 6 (six) hours as needed for moderate pain. 30 tablet 0   No current facility-administered medications on file prior to visit.    ROS as in subjective    Objective: BP 130/84   Pulse 86   Temp 99.6 F (37.6 C)   Wt 220 lb 6.4 oz (100 kg)   SpO2 95%   BMI 30.74 kg/m   Wt Readings from Last 3 Encounters:  05/28/16 220 lb 6.4 oz (100 kg)  05/13/16 225 lb (102.1 kg)  04/30/16 222 lb (100.7 kg)    General: mildly Ill-appearing, well-developed, well-nourished Skin: warm, dry HEENT: Nose inflamed and congested, clear conjunctiva, TMs pearly, no sinus tenderness, pharynx with erythema, no exudates Neck: Supple, non tender, shotty cervical adenopathy Heart: Regular rate and rhythm, normal S1, S2, no murmurs Lungs: Clear to auscultation  bilaterally, no wheezes, rales, rhonchi Abdomen: Non tender non distended Extremities: Mild generalized tenderness   Assessment: Encounter Diagnoses  Name Primary?  . Flu-like symptoms Yes  . Influenza   . Cough   . Chronic obstructive pulmonary disease, unspecified COPD type ([place])      Plan: Prescription given for Tamiflu, discussed risks/benefits of medication.    Discussed diagnosis of influenza. Discussed supportive care including rest, hydration, OTC Tylenol or NSAID for fever, aches, and malaise.  Discussed period of contagion, self quarantine at home away from others to avoid spread of disease, discussed means of transmission, and possible complications including pneumonia.  If worse or not improving within the next 4-5 days, then call or return.  Patient voiced understanding of diagnosis, recommendations, and treatment plan.  After visit summary given.

## 2016-05-28 NOTE — Patient Instructions (Addendum)
Encounter Diagnoses  Name Primary?  . Flu-like symptoms Yes  . Influenza   . Cough   . Chronic obstructive pulmonary disease, unspecified COPD type ([place])    Recommendations:  REST  HYDRATE  With water and clear fluids  You can use Tylenol for fever, aches, chills  You can begin Tamiflu twice daily to help with flu like symptoms  Avoid getting dehydrated  If you feel dry and dehydrated, then HOLD your Losartan HCT blood pressure medication a few days until you are more hydrated  If you get much worse in the coming days, then go to the emergency dept   Thank you for giving me the opportunity to serve you today.   Your diagnosis today includes: Encounter Diagnoses  Name Primary?  . Flu-like symptoms Yes  . Influenza   . Cough   . Chronic obstructive pulmonary disease, unspecified COPD type ([place])     Medications prescribed today:   Specific home care recommendations today include:  Only take over-the-counter (OTC) or prescription medicines for pain, discomfort, or fever as directed by your caregiver.    Decongestant: You may use OTC Guaifenesin (Mucinex plain) for congestion.  You may use Pseudoephedrine (Sudafed) only if you don't have blood pressure problems or a diagnosis of hypertension.  Cough suppression: If you have cough from drainage, you may use over-the-counter Dextromethorphan (Delsym) as directed on the label  Sore throat remedies:  You may use salt water gargles, warm fluids such as coffee or hot tea, or honey/tea/lemon mixture to sooth sore throat pain.  You may use OTC sore throat remedies such as Cepacol lozenges or Chloraseptic spray for sore throat pain.  Runny nose and sneezing remedies: You may use OTC antihistamine such as Zyrtec or Benadryl, but caution as these can cause drowsiness.    Pain/fever relief: You may use over-the-counter Tylenol for pain or fever  Drink extra fluids. Fluids help thin the mucus so your sinuses can drain more easily.    Applying either moist heat or ice packs to the sinus areas may help relieve discomfort.  Use saline nasal sprays to help moisten your sinuses. The sprays can be found at your local drugstore.    Please call or return if worse or not improving in the next few days.    Medication costs:  If you get to the pharmacy and medication prescribed today was either too expensive, not covered by your insurance, or required prior authorization, then please call us back to let us know.  We often have no way to know if a medication is too expensive or not covered by your insurance.  Thanks for your cooperation.   No Follow-up on file.    I have included other useful information below for your review.   Influenza, Adult Influenza ("the flu") is a viral infection of the respiratory tract. It causes chills, fever, cough, headache, body aches, and sore throat. Influenza in general will make you feel sicker than when you have a common cold. Symptoms of the illness typically last a few days. Cough and fatigue may continue for as long as 7 to 10 days. Influenza is highly contagious. It spreads easily to others in the droplets from coughs and sneezes. People frequently become infected by touching something that was recently contaminated with the virus and then touch their mouth, nose or eyes. This infection is caused by a virus. Symptoms will not be reduced or improved by taking an antibiotic. Antibiotics are medications that kill bacteria, not  viruses. DIAGNOSIS  Diagnosis of influenza is often made based on the history and physical examination as well as the presence of influenza reports occurring in your community. Testing can be done if the diagnosis is not certain. TREATMENT  Since influenza is caused by a virus, antibiotics are not helpful. Your caregiver may prescribe antiviral medicines to shorten the illness and lessen the severity. Your caregiver may also recommend influenza vaccination and/or antiviral  medicines for your family members in order to prevent the spread of influenza to them. HOME CARE INSTRUCTIONS  DO NOT GIVE ASPIRIN TO PERSONS WITH INFLUENZA WHO ARE UNDER 57 YEARS OF AGE. This could lead to brain and liver damage (Reye's syndrome). Read the label on over-the-counter medicines.   Stay home from work or school if at all possible until most of your symptoms are gone.   Only take over-the-counter or prescription medicines for pain, discomfort, or fever as directed by your caregiver.   Use a cool mist humidifier to increase air moisture. This will make breathing easier.   Rest until your temperature is nearly normal: 98.6 F (37 C). This usually takes 3 to 4 days. Be sure you get plenty of rest.   Drink at least eight, eight-ounce glasses of fluids per day. Fluids include water, juice, broth, gelatin, or lemonade.   Cover your mouth and nose when coughing or sneezing and wash your hands often to prevent the spread of this virus to other persons.  PREVENTION  Annual influenza vaccination (flu shots) is the best way to avoid getting influenza. An annual flu shot is now routinely recommended for all adults in the [place] IF:   You develop shortness of breath while resting.   You have a deep cough with production of mucous or chest pain.   You develop nausea (feeling sick to your stomach), vomiting, or diarrhea.  SEEK IMMEDIATE MEDICAL CARE IF:   You have difficulty breathing, become short of breath, or your skin or nails turn bluish.   You develop severe neck pain or stiffness.   You develop a severe headache, facial pain, or earache.   You have a fever.   You develop nausea or vomiting that cannot be controlled.  Document Released: 03/26/2000 Document Revised: 12/09/2010 Document Reviewed: 01/29/2009 [facility] Patient Information 2012 [place].

## 2016-06-02 ENCOUNTER — Encounter: Payer: Self-pay | Admitting: Family Medicine

## 2016-06-02 ENCOUNTER — Ambulatory Visit
Admission: RE | Admit: 2016-06-02 | Discharge: 2016-06-02 | Disposition: A | Discharge status: Home / Self Care | Payer: PPO | Source: Ambulatory Visit | Attending: Family Medicine | Admitting: Family Medicine

## 2016-06-02 ENCOUNTER — Other Ambulatory Visit: Payer: Self-pay | Admitting: Family Medicine

## 2016-06-02 ENCOUNTER — Ambulatory Visit (INDEPENDENT_AMBULATORY_CARE_PROVIDER_SITE_OTHER): Payer: PPO | Admitting: Family Medicine

## 2016-06-02 VITALS — BP 120/70 | HR 60 | Temp 97.7°F | Resp 16 | Wt 215.4 lb

## 2016-06-02 DIAGNOSIS — R1010 Upper abdominal pain, unspecified: Secondary | ICD-10-CM | POA: Diagnosis not present

## 2016-06-02 DIAGNOSIS — R5383 Other fatigue: Secondary | ICD-10-CM | POA: Diagnosis not present

## 2016-06-02 DIAGNOSIS — R6881 Early satiety: Secondary | ICD-10-CM

## 2016-06-02 DIAGNOSIS — R634 Abnormal weight loss: Secondary | ICD-10-CM

## 2016-06-02 DIAGNOSIS — K219 Gastro-esophageal reflux disease without esophagitis: Secondary | ICD-10-CM

## 2016-06-02 DIAGNOSIS — R05 Cough: Secondary | ICD-10-CM

## 2016-06-02 DIAGNOSIS — R058 Other specified cough: Secondary | ICD-10-CM

## 2016-06-02 DIAGNOSIS — D509 Iron deficiency anemia, unspecified: Secondary | ICD-10-CM

## 2016-06-02 DIAGNOSIS — R079 Chest pain, unspecified: Secondary | ICD-10-CM | POA: Diagnosis not present

## 2016-06-02 LAB — CBC WITH DIFFERENTIAL/PLATELET
Basophils Absolute: 25 cells/uL (ref 0–200)
Basophils Relative: 1 %
Eosinophils Absolute: 75 cells/uL (ref 15–500)
Eosinophils Relative: 3 %
HCT: 34.3 % — ABNORMAL LOW (ref 38.5–50.0)
Hemoglobin: 10.9 g/dL — ABNORMAL LOW (ref 13.2–17.1)
Lymphocytes Relative: 36 %
Lymphs Abs: 900 cells/uL (ref 850–3900)
MCH: 21.1 pg — ABNORMAL LOW (ref 27.0–33.0)
MCHC: 31.8 g/dL — ABNORMAL LOW (ref 32.0–36.0)
MCV: 66.3 fL — ABNORMAL LOW (ref 80.0–100.0)
Monocytes Absolute: 325 cells/uL (ref 200–950)
Monocytes Relative: 13 %
Neutro Abs: 1175 cells/uL — ABNORMAL LOW (ref 1500–7800)
Neutrophils Relative %: 47 %
Platelets: 130 10*3/uL — ABNORMAL LOW (ref 140–400)
RBC: 5.17 MIL/uL (ref 4.20–5.80)
RDW: 18.4 % — ABNORMAL HIGH (ref 11.0–15.0)
WBC: 2.5 10*3/uL — ABNORMAL LOW (ref 4.0–10.5)

## 2016-06-02 LAB — COMPREHENSIVE METABOLIC PANEL
ALT: 16 U/L (ref 9–46)
AST: 16 U/L (ref 10–35)
Albumin: 3.9 g/dL (ref 3.6–5.1)
Alkaline Phosphatase: 68 U/L (ref 40–115)
BUN: 13 mg/dL (ref 7–25)
CO2: 30 mmol/L (ref 20–31)
Calcium: 9 mg/dL (ref 8.6–10.3)
Chloride: 99 mmol/L (ref 98–110)
Creat: 1.36 mg/dL — ABNORMAL HIGH (ref 0.70–1.18)
Glucose, Bld: 97 mg/dL (ref 65–99)
Potassium: 3.9 mmol/L (ref 3.5–5.3)
Sodium: 137 mmol/L (ref 135–146)
Total Bilirubin: 0.4 mg/dL (ref 0.2–1.2)
Total Protein: 7 g/dL (ref 6.1–8.1)

## 2016-06-02 LAB — IRON,TIBC AND FERRITIN PANEL
%SAT: 14 % — ABNORMAL LOW (ref 15–60)
Ferritin: 358 ng/mL (ref 20–380)
Iron: 40 ug/dL — ABNORMAL LOW (ref 50–180)
TIBC: 288 ug/dL (ref 250–425)

## 2016-06-02 LAB — LIPASE: Lipase: 74 U/L — ABNORMAL HIGH (ref 7–60)

## 2016-06-02 LAB — AMYLASE: Amylase: 79 U/L (ref 0–105)

## 2016-06-02 NOTE — Progress Notes (Signed)
Subjective:    [name], male    DOB: 20-Mar-1941, 76 y.o.   MRN: [id number]  HPI Chief Complaint  Patient presents with  . sick    had flu last week and still tired and run down. no appetite, had pain in shoulder pains, lower back and stomach hurts- with diarrhea   He is here with complaints of fatigue, mild rhinorrhea, chest tightness, occasional wheezing, productive cough, loss of appetite, and diarrhea with dark "green" stool. Denies blood or pus. Upper abdominal discomfort as well.   He was diagnosed with influenza last week and took Tamiflu. States his energy level and appetite have not improved.  Reports that his appetite has been poor since he had his bottom teeth pulled 3-4 months ago. States he eats only 1 meal per day and he gets full after only a couple of bites of food. He also complains of increased eructation. Denies difficulty swallowing.   He has a history of GERD and takes Omeprazole twice daily. States he had a barium swallow a few years ago for severe GERD. Reports having a colonoscopy within the past 5 years at the [facility].   History of anemia. Takes iron everyday and has been taking it twice daily for the past few weeks. States he had to have a blood transfusion in the past due to anemia.   Denies fever, chills, dizziness, body aches, ear pain, sore throat, shortness of breath, vomiting, back pain, urinary symptoms.    5 lbs weight loss in past week. Lost 10 lbs since May 13 2016 He denies smoking but did smoke for many years and quit in 2013. Denies alcohol use but did drink in his earlier years. No drug use.  He lives alone and works part time.   Denies feeling depressed.    Reviewed allergies, medications, past medical, surgical, and social history. Past Medical History:  Diagnosis Date  . Atypical chest pain   . CAD (coronary artery disease)    a. Multiple prior evaluations then 05/2012 s/p BMS to OM2. b. Nuc 11/2012 wnl. c. Cath 2015: patent  stent, moderate LAD/RCA dz, treated medically.  . Cancer ([place])    Hx: of prostate  . CKD (chronic kidney disease), stage II   . COPD (chronic obstructive pulmonary disease) ([place])   . Cough secondary to angiotensin converting enzyme inhibitor (ACE-I)   . Diabetes mellitus without complication ([place])   . Diverticulosis   . DVT (deep venous thrombosis) ([place])   . GERD (gastroesophageal reflux disease)   . Headache(784.0)    Hx: of  . History of blood transfusion    "I've had 2; don't know what it was related to" (11/09/2012)  . HLD (hyperlipidemia)   . HTN (hypertension)   . Iron deficiency anemia   . Microcytic anemia   . Peripheral vascular disease ([place])    a. L pop-tibial bypass 2011, followed by [facility].  . Rotator cuff injury    "left arm; never repaired" (11/09/2012)   Past Surgical History:  Procedure Laterality Date  . CARDIOVASCULAR STRESS TEST  Aug. 2014  . CATARACT EXTRACTION W/ INTRAOCULAR LENS IMPLANT Right ~ 2008  . CIRCUMCISION    . CORONARY ANGIOPLASTY WITH STENT PLACEMENT  2014   "1" (11/09/2012)  . ILIAC ARTERY ANEURYSM REPAIR    . LEFT HEART CATH  05-31-12  . LEFT HEART CATHETERIZATION WITH CORONARY ANGIOGRAM N/A 06/01/2011   Procedure: LEFT HEART CATHETERIZATION WITH CORONARY ANGIOGRAM;  Surgeon: Hillary Bow, MD;  Location: [facility] CATH LAB;  Service: Cardiovascular;  Laterality: N/A;  . LEFT HEART CATHETERIZATION WITH CORONARY ANGIOGRAM N/A 05/31/2012   Procedure: LEFT HEART CATHETERIZATION WITH CORONARY ANGIOGRAM;  Surgeon: Peter M Martinique, MD;  Location: [facility] CATH LAB;  Service: Cardiovascular;  Laterality: N/A;  . LEFT HEART CATHETERIZATION WITH CORONARY ANGIOGRAM N/A 08/01/2013   Procedure: LEFT HEART CATHETERIZATION WITH CORONARY ANGIOGRAM;  Surgeon: Burnell Blanks, MD;  Location: [facility] CATH LAB;  Service: Cardiovascular;  Laterality: N/A;  . PERCUTANEOUS CORONARY STENT INTERVENTION (PCI-S)  05/31/2012   Procedure: PERCUTANEOUS CORONARY STENT INTERVENTION (PCI-S);   Surgeon: Peter M Martinique, MD;  Location: [facility] CATH LAB;  Service: Cardiovascular;;  . PR VEIN BYPASS GRAFT,AORTO-FEM-POP Left 10/01/2009   left below knee popliteal artery to posterior tibial artery  . SHOULDER ARTHROSCOPY WITH OPEN ROTATOR CUFF REPAIR AND DISTAL CLAVICLE ACROMINECTOMY Left 01/12/2013   Procedure: LEFT SHOULDER ARTHROSCOPY WITH DEBRIDEMENT OPEN DISTAL CLAVICLE RESECTION ,acromioplastyAND ROTATOR CUFF REPAIR;  Surgeon: Yvette Rack., MD;  Location: [facility];  Service: Orthopedics;  Laterality: Left;    Current Outpatient Prescriptions:  .  amLODipine (NORVASC) 10 MG tablet, Take 1 tablet (10 mg total) by mouth daily., Disp: 30 tablet, Rfl: 5 .  aspirin EC 81 MG tablet, Take 81 mg by mouth daily. , Disp: , Rfl:  .  ferrous sulfate 325 (65 FE) MG tablet, Take 325 mg by mouth daily with breakfast., Disp: , Rfl:  .  gabapentin (NEURONTIN) 600 MG tablet, Take 600 mg by mouth at bedtime., Disp: , Rfl:  .  losartan-hydrochlorothiazide (HYZAAR) 100-25 MG tablet, take 1 tablet by mouth once daily, Disp: 90 tablet, Rfl: 1 .  metFORMIN (GLUCOPHAGE) 500 MG tablet, Take 500 mg by mouth 2 (two) times daily with a meal. Reported on 08/27/2015, Disp: , Rfl:  .  nitroGLYCERIN (NITROSTAT) 0.4 MG SL tablet, Place 1 tablet (0.4 mg total) under the tongue every 5 (five) minutes as needed for chest pain., Disp: 25 tablet, Rfl: 2 .  omeprazole (PRILOSEC) 20 MG capsule, Take 20 mg by mouth 2 (two) times daily before a meal., Disp: , Rfl:  .  oseltamivir (TAMIFLU) 75 MG capsule, Take 1 capsule (75 mg total) by mouth 2 (two) times daily., Disp: 10 capsule, Rfl: 0 .  pravastatin (PRAVACHOL) 40 MG tablet, Take 40 mg by mouth every evening., Disp: , Rfl:  .  tamsulosin (FLOMAX) 0.4 MG CAPS capsule, Take 0.4 mg by mouth daily after supper. , Disp: , Rfl:  .  traMADol (ULTRAM) 50 MG tablet, Take 1 tablet (50 mg total) by mouth every 6 (six) hours as needed for moderate pain., Disp: 30 tablet, Rfl: 0 .  PROAIR HFA 108  (90 Base) MCG/ACT inhaler, inhale 2 puffs every 4 hours, Disp: 8.5 g, Rfl: 1     Review of Systems Pertinent positives and negatives in the history of present illness.     Objective:   Physical Exam  Constitutional: He is oriented to person, place, and time. Vital signs are normal. He appears well-developed and well-nourished.  Non-toxic appearance. No distress.  HENT:  Nose: Nose normal. Right sinus exhibits no maxillary sinus tenderness and no frontal sinus tenderness. Left sinus exhibits no maxillary sinus tenderness and no frontal sinus tenderness.  Mouth/Throat: Uvula is midline and oropharynx is clear and moist. Mucous membranes are pale.  Mild pallor  Eyes: Conjunctivae and EOM are normal. Pupils are equal, round, and reactive to light. No scleral icterus.  Neck: Trachea normal and normal  range of motion. Neck supple. No thyromegaly present.  Cardiovascular: Normal rate, regular rhythm, normal heart sounds and normal pulses.  Exam reveals no gallop and no friction rub.   No murmur heard. Pulmonary/Chest: Effort normal. He has decreased breath sounds in the right lower field and the left lower field.  Abdominal: Soft. Normal appearance. He exhibits no ascites and no pulsatile midline mass. Bowel sounds are decreased. There is no hepatosplenomegaly. There is tenderness in the epigastric area. There is no rigidity, no rebound, no guarding, no CVA tenderness, no tenderness at McBurney's point and negative Murphy's sign.  Lymphadenopathy:    He has no cervical adenopathy.  Neurological: He is alert and oriented to person, place, and time. He has normal strength. No cranial nerve deficit or sensory deficit. Coordination and gait normal.  Skin: Skin is warm and dry. He is not diaphoretic. No cyanosis. No pallor.  Psychiatric: He has a normal mood and affect. His speech is normal and behavior is normal. Thought content normal. Cognition and memory are normal.   BP 120/70   Pulse 60    Temp 97.7 F (36.5 C) (Oral)   Resp 16   Wt 215 lb 6.4 oz (97.7 kg)   SpO2 98%   BMI 30.04 kg/m       Assessment & Plan:  Pain of upper abdomen - Plan: EKG 12-Lead, CBC with Differential/Platelet, Comprehensive metabolic panel, Amylase, Lipase  Gastroesophageal reflux disease, esophagitis presence not specified  Iron deficiency anemia, unspecified iron deficiency anemia type - Plan: Iron, TIBC and Ferritin Panel  Early satiety - Plan: CBC with Differential/Platelet, Comprehensive metabolic panel  Recent unexplained weight loss - Plan: CBC with Differential/Platelet, Comprehensive metabolic panel, DG Chest 2 View  Productive cough - Plan: DG Chest 2 View  Fatigue, unspecified type - Plan: Iron, TIBC and Ferritin Panel  Discussed that he has had a 10 lb weight loss in the past 3 weeks which is concerning. Plan to refer him to GI due to poor appetite, early satiety, eructation, upper abdominal discomfort and diarrhea. He will see [facility] tomorrow at 10am.  Fatigue may be related to IDA. Will recheck iron studies and address accordingly. Symptoms do not appear to be cardiac related.  ECG shows sinus bradycardia with 1st degree block, unremarkable otherwise.  He is hemodynamicaly stable and appears safe to let him go home.  Will send him for a chest XR due to recent influenza and now worsening productive cough.  Follow up pending XR and labs.  He is aware that if his symptoms worsen that he should go to the ED or call 911.

## 2016-06-02 NOTE — Patient Instructions (Signed)
You have an appointment at [facility] tomorrow at 10:00 am   [place].

## 2016-06-03 ENCOUNTER — Other Ambulatory Visit: Payer: Self-pay | Admitting: Family Medicine

## 2016-06-03 ENCOUNTER — Encounter: Payer: Self-pay | Admitting: Nurse Practitioner

## 2016-06-03 ENCOUNTER — Ambulatory Visit (INDEPENDENT_AMBULATORY_CARE_PROVIDER_SITE_OTHER): Payer: PPO | Admitting: Nurse Practitioner

## 2016-06-03 VITALS — BP 116/68 | HR 72 | Ht 69.0 in | Wt 214.1 lb

## 2016-06-03 DIAGNOSIS — M791 Myalgia: Secondary | ICD-10-CM | POA: Diagnosis not present

## 2016-06-03 DIAGNOSIS — K219 Gastro-esophageal reflux disease without esophagitis: Secondary | ICD-10-CM

## 2016-06-03 DIAGNOSIS — M7918 Myalgia, other site: Secondary | ICD-10-CM

## 2016-06-03 DIAGNOSIS — R634 Abnormal weight loss: Secondary | ICD-10-CM | POA: Diagnosis not present

## 2016-06-03 DIAGNOSIS — D61818 Other pancytopenia: Secondary | ICD-10-CM

## 2016-06-03 NOTE — Patient Instructions (Signed)
You have been scheduled for an endoscopy. Please follow written instructions given to you at your visit today. If you use inhalers (even only as needed), please bring them with you on the day of your procedure. Your physician has requested that you go to www.startemmi.com and enter the access code given to you at your visit today. This web site gives a general overview about your procedure. However, you should still follow specific instructions given to you by our office regarding your preparation for the procedure.  We have requested your records from the [facility] in [place] for colonoscopy.  If you are age 77 or older, your body mass index should be between 23-30. Your Body mass index is 31.62 kg/m. If this is out of the aforementioned range listed, please consider follow up with your Primary Care Provider.  If you are age 39 or younger, your body mass index should be between 19-25. Your Body mass index is 31.62 kg/m. If this is out of the aformentioned range listed, please consider follow up with your Primary Care Provider.

## 2016-06-03 NOTE — Progress Notes (Addendum)
Addendum 07/01/16:   note received from [facility]. Record of colonoscopy being done at that facility I will ask nurse to call patient and inquire. :      HPI: Patient is a 76 year old male with altered medical problems not limited to CAD, status post stent in 2014, CKD2,  COPD, and DM 2. Previously known to Dr. Deatra Ina. He had a screening colonoscopy in 2008 with findings of diverticulosis. Patient is referred by PCP, [name], NP for evaluation of weight loss. Recently had flulike symptoms, he has one more dose of Tamiflu. Patient feels his appetite was off some during this illness but he still doesn't have a desire to eat very much and gets full quicker when he does.  No nausea or vomiting.  No abdominal pain. No dysphagia, no medication changes.  Patient mentions that he has been having chest pain . Patient tells me that the chest pain started a few days ago but I see that he was seen in the emergency department late January for chest pain. No acute changes on EKG and negative troponins and he has since been seen in Cardiology office.  He describes chest pain as related to movements such as twisting. No shortness of breath and pain is not exertional.   Past Medical History:  Diagnosis Date  . Atypical chest pain   . CAD (coronary artery disease)    a. Multiple prior evaluations then 05/2012 s/p BMS to OM2. b. Nuc 11/2012 wnl. c. Cath 2015: patent stent, moderate LAD/RCA dz, treated medically.  . Cancer ([place])    Hx: of prostate  . CKD (chronic kidney disease), stage II   . COPD (chronic obstructive pulmonary disease) ([place])   . Cough secondary to angiotensin converting enzyme inhibitor (ACE-I)   . Diabetes mellitus without complication ([place])   . Diverticulosis   . DVT (deep venous thrombosis) ([place])   . GERD (gastroesophageal reflux disease)   . Headache(784.0)    Hx: of  . History of blood transfusion    "I've had 2; don't know what it was related to" (11/09/2012)    . HLD (hyperlipidemia)   . HTN (hypertension)   . Iron deficiency anemia   . Microcytic anemia   . Peripheral vascular disease ([place])    a. L pop-tibial bypass 2011, followed by [facility].  . Rotator cuff injury    "left arm; never repaired" (11/09/2012)     Past Surgical History:  Procedure Laterality Date  . CARDIOVASCULAR STRESS TEST  Aug. 2014  . CATARACT EXTRACTION W/ INTRAOCULAR LENS IMPLANT Right ~ 2008  . CIRCUMCISION    . CORONARY ANGIOPLASTY WITH STENT PLACEMENT  2014   "1" (11/09/2012)  . ILIAC ARTERY ANEURYSM REPAIR    . LEFT HEART CATH  05-31-12  . LEFT HEART CATHETERIZATION WITH CORONARY ANGIOGRAM N/A 06/01/2011   Procedure: LEFT HEART CATHETERIZATION WITH CORONARY ANGIOGRAM;  Surgeon: Hillary Bow, MD;  Location: [facility] CATH LAB;  Service: Cardiovascular;  Laterality: N/A;  . LEFT HEART CATHETERIZATION WITH CORONARY ANGIOGRAM N/A 05/31/2012   Procedure: LEFT HEART CATHETERIZATION WITH CORONARY ANGIOGRAM;  Surgeon: Peter M Martinique, MD;  Location: [facility] CATH LAB;  Service: Cardiovascular;  Laterality: N/A;  . LEFT HEART CATHETERIZATION WITH CORONARY ANGIOGRAM N/A 08/01/2013   Procedure: LEFT HEART CATHETERIZATION WITH CORONARY ANGIOGRAM;  Surgeon: Burnell Blanks, MD;  Location: [facility] CATH LAB;  Service: Cardiovascular;  Laterality: N/A;  . PERCUTANEOUS CORONARY STENT INTERVENTION (PCI-S)  05/31/2012   Procedure: PERCUTANEOUS CORONARY STENT INTERVENTION (  PCI-S);  Surgeon: Peter M Martinique, MD;  Location: [facility] C[facility] LAB;  Service: Cardiovascular;;  . PR VEIN BYPASS GRAFT,AORTO-FEM-POP Left 10/01/2009   left below knee popliteal artery to posterior tibial artery  . SHOULDER ARTHROSCOPY WITH OPEN ROTATOR CUFF REPAIR AND DISTAL CLAVICLE ACROMINECTOMY Left 01/12/2013   Procedure: LEFT SHOULDER ARTHROSCOPY WITH DEBRIDEMENT OPEN DISTAL CLAVICLE RESECTION ,acromioplastyAND ROTATOR CUFF REPAIR;  Surgeon: Yvette Rack., MD;  Location: [facility];  Service: Orthopedics;  Laterality: Left;   Family  History  Problem Relation Age of Onset  . Breast cancer Mother     deceased  . Hyperlipidemia Mother   . Hypertension Mother   . COPD Father     deceased  . Cancer Father   . Hyperlipidemia Father   . Hypertension Father   . Hypertension Sister   . Cancer Brother   . Hypertension Brother   . Hypertension Daughter   . Hypertension Son    Social History  Substance Use Topics  . Smoking status: Former Smoker    Packs/day: 1.00    Years: 55.00    Types: Cigarettes    Quit date: 05/26/2011  . Smokeless tobacco: Never Used  . Alcohol use No   Current Outpatient Prescriptions  Medication Sig Dispense Refill  . amLODipine (NORVASC) 10 MG tablet Take 1 tablet (10 mg total) by mouth daily. 30 tablet 5  . aspirin EC 81 MG tablet Take 81 mg by mouth daily.     . ferrous sulfate 325 (65 FE) MG tablet Take 325 mg by mouth daily with breakfast.    . gabapentin (NEURONTIN) 600 MG tablet Take 600 mg by mouth at bedtime.    Marland Kitchen losartan-hydrochlorothiazide (HYZAAR) 100-25 MG tablet take 1 tablet by mouth once daily 90 tablet 1  . metFORMIN (GLUCOPHAGE) 500 MG tablet Take 500 mg by mouth 2 (two) times daily with a meal. Reported on 08/27/2015    . nitroGLYCERIN (NITROSTAT) 0.4 MG SL tablet Place 1 tablet (0.4 mg total) under the tongue every 5 (five) minutes as needed for chest pain. 25 tablet 2  . omeprazole (PRILOSEC) 20 MG capsule Take 20 mg by mouth 2 (two) times daily before a meal.    . oseltamivir (TAMIFLU) 75 MG capsule Take 1 capsule (75 mg total) by mouth 2 (two) times daily. 10 capsule 0  . pravastatin (PRAVACHOL) 40 MG tablet Take 40 mg by mouth every evening.    Marland Kitchen PROAIR HFA 108 (90 Base) MCG/ACT inhaler inhale 2 puffs every 4 hours 8.5 g 1  . tamsulosin (FLOMAX) 0.4 MG CAPS capsule Take 0.4 mg by mouth daily after supper.     . traMADol (ULTRAM) 50 MG tablet Take 1 tablet (50 mg total) by mouth every 6 (six) hours as needed for moderate pain. 30 tablet 0   No current  facility-administered medications for this visit.    Allergies  Allergen Reactions  . Atenolol     bradycardia  . Imdur [Isosorbide Mononitrate] Nausea Only and Other (See Comments)    Headache  . Lisinopril Other (See Comments)    Cough  . Topiramate     Shortness of breath and leg cramps  . Tramadol-Acetaminophen Other (See Comments)    intolerance     Review of Systems: All systems reviewed and negative except where noted in HPI.    Physical Exam: BP 116/68   Pulse 72   Ht 5\' 9"  (1.753 m)   Wt 214 lb 2 oz (97.1 kg)  BMI 31.62 kg/m  Constitutional:  Well-developed, black male in no acute distress. Psychiatric: Normal mood and affect. Behavior is normal. HEENT: Normocephalic and atraumatic. Conjunctivae are normal. No scleral icterus. Neck supple.  Cardiovascular: Normal rate, regular rhythm.  Pulmonary/chest: Effort normal and breath sounds normal. No wheezing, rales or rhonchi. Abdominal: Soft, nondistended, nontender. Bowel sounds active throughout. There are no masses palpable. No hepatomegaly. Extremities: no edema Lymphadenopathy: No cervical adenopathy noted. Neurological: Alert and oriented to person place and time. Skin: Skin is warm and dry. No rashes noted.   ASSESSMENT AND PLAN:  27. 76 year old male with significant weight loss. Nearly 20 pound loss since December. Decreased appetite, describes early satiety.  -Patient will be scheduled for EGD. The risks and benefits of EGD were discussed and the patient agrees to proceed.  -If EGD negative, recommend CTscan. Minimally elevated Lipase.  2. Chest discomfort, non-exertional, definitely related to activity. Already followed by Cardiology for this, see HPI.  No acute findings on chest x-ray. Suspect musculoskeletal pain.  3. Colon cancer screening. He reports colonoscopy done at the [facility] in [place] last year. Requested records  Tye Savoy, NP  06/03/2016, 10:58 AM  Cc: Girtha Rm, NP  Agree  with Ms. Braeton Wolgamott's assessment and plan. Gatha Mayer, MD, [facility]

## 2016-06-03 NOTE — Progress Notes (Signed)
   Subjective:    [name], male    DOB: [birth year], 76 y.o.   MRN: [id number]  HPI    Review of Systems     Objective:   Physical Exam        Assessment & Plan:

## 2016-06-18 ENCOUNTER — Encounter: Payer: Self-pay | Admitting: Internal Medicine

## 2016-06-29 ENCOUNTER — Telehealth: Payer: Self-pay | Admitting: Internal Medicine

## 2016-06-29 NOTE — Telephone Encounter (Signed)
OK no charge ?

## 2016-06-30 ENCOUNTER — Encounter: Payer: PPO | Admitting: Internal Medicine

## 2016-07-21 ENCOUNTER — Ambulatory Visit (AMBULATORY_SURGERY_CENTER): Payer: PPO | Admitting: Internal Medicine

## 2016-07-21 ENCOUNTER — Encounter: Payer: Self-pay | Admitting: Internal Medicine

## 2016-07-21 VITALS — BP 112/64 | HR 62 | Temp 97.1°F | Resp 18 | Ht 69.0 in | Wt 214.0 lb

## 2016-07-21 DIAGNOSIS — K219 Gastro-esophageal reflux disease without esophagitis: Secondary | ICD-10-CM | POA: Diagnosis not present

## 2016-07-21 DIAGNOSIS — K2900 Acute gastritis without bleeding: Secondary | ICD-10-CM | POA: Diagnosis not present

## 2016-07-21 DIAGNOSIS — K3189 Other diseases of stomach and duodenum: Secondary | ICD-10-CM | POA: Diagnosis not present

## 2016-07-21 DIAGNOSIS — R634 Abnormal weight loss: Secondary | ICD-10-CM

## 2016-07-21 DIAGNOSIS — I2510 Atherosclerotic heart disease of native coronary artery without angina pectoris: Secondary | ICD-10-CM | POA: Diagnosis not present

## 2016-07-21 MED ORDER — SODIUM CHLORIDE 0.9 % IV SOLN: 500.0000 mL | INTRAVENOUS | Status: DC

## 2016-07-21 NOTE — Progress Notes (Signed)
A/ox3 pleased with MAC, report to Jane RN 

## 2016-07-21 NOTE — Progress Notes (Signed)
Pt has many questions re: the possible complications and doesn't want "any cutting" done.  I tried to answer his questions and reassure him we would be performing any surgeries, only possible bxs.  I gave him consent form to read over again.  Dr. Carlean Purl at bedside to answer questions before procedure.    When starting IV, he starts he does not want IV on top of the hand due to a bad experience.  IV started in right [facility] per pt request

## 2016-07-21 NOTE — Progress Notes (Signed)
Called to room to assist during endoscopic procedure.  Patient ID and intended procedure confirmed with present staff. Received instructions for my participation in the procedure from the performing physician.  

## 2016-07-21 NOTE — Op Note (Signed)
[facility] Patient Name: Joel [name] Procedure Date: 07/21/2016 10:19 AM MRN: [id number] Endoscopist: Gatha Mayer , MD Age: 76 Referring MD:  Date of Birth: 10/14/1940 Gender: Male Account #: [id number] Procedure:                Upper GI endoscopy Indications:              Weight loss Medicines:                Propofol per Anesthesia, Monitored Anesthesia Care Procedure:                Pre-Anesthesia Assessment:                           - Prior to the procedure, a History and Physical                            was performed, and patient medications and                            allergies were reviewed. The patient's tolerance of                            previous anesthesia was also reviewed. The risks                            and benefits of the procedure and the sedation                            options and risks were discussed with the patient.                            All questions were answered, and informed consent                            was obtained. Prior Anticoagulants: The patient                            last took aspirin on the day of the procedure. ASA                            Grade Assessment: III - A patient with severe                            systemic disease. After reviewing the risks and                            benefits, the patient was deemed in satisfactory                            condition to undergo the procedure.                           After obtaining informed consent, the endoscope was  passed under direct vision. Throughout the                            procedure, the patient's blood pressure, pulse, and                            oxygen saturations were monitored continuously. The                            Model GIF-[id number]) scope was introduced                            through the mouth, and advanced to the second part                            of duodenum. The upper GI  endoscopy was                            accomplished without difficulty. The patient                            tolerated the procedure well. Scope In: Scope Out: Findings:                 Patchy mucosal changes characterized by congestion,                            discoloration and altered texture were found in the                            prepyloric region of the stomach. Biopsies were                            taken with a cold forceps for histology.                            Verification of patient identification for the                            specimen was done. Estimated blood loss was minimal.                           A small hiatal hernia was present.                           A non-obstructing Schatzki ring (acquired) was                            found at the gastroesophageal junction.                           The exam was otherwise without abnormality.                           The cardia and gastric fundus were normal on  retroflexion. Complications:            No immediate complications. Estimated Blood Loss:     Estimated blood loss was minimal. Impression:               - Congested, discolored and texture changed mucosa                            in the prepyloric region of the stomach. Biopsied.                           - Small hiatal hernia.                           - Non-obstructing Schatzki ring.                           - The examination was otherwise normal. Recommendation:           - Patient has a contact number available for                            emergencies. The signs and symptoms of potential                            delayed complications were discussed with the                            patient. Return to normal activities tomorrow.                            Written discharge instructions were provided to the                            patient.                           - Resume previous diet.                            - Continue present medications.                           - Await pathology results. Gatha Mayer, MD 07/21/2016 12:54:00 PM This report has been signed electronically.

## 2016-07-21 NOTE — Patient Instructions (Addendum)
There were some areas in the stomach lining that look irritated and inflamed - I took biopsies to try to understand things.  I will call you with results.  I appreciate the opportunity to care for you. Gatha Mayer, MD, FACG  YOU HAD AN ENDOSCOPIC PROCEDURE TODAY AT [facility]:   Refer to the procedure report that was given to you for any specific questions about what was found during the examination.  If the procedure report does not answer your questions, please call your gastroenterologist to clarify.  If you requested that your care partner not be given the details of your procedure findings, then the procedure report has been included in a sealed envelope for you to review at your convenience later.  YOU SHOULD EXPECT: Some feelings of bloating in the abdomen. Passage of more gas than usual.  Walking can help get rid of the air that was put into your GI tract during the procedure and reduce the bloating. If you had a lower endoscopy (such as a colonoscopy or flexible sigmoidoscopy) you may notice spotting of blood in your stool or on the toilet paper. If you underwent a bowel prep for your procedure, you may not have a normal bowel movement for a few days.  Please Note:  You might notice some irritation and congestion in your nose or some drainage.  This is from the oxygen used during your procedure.  There is no need for concern and it should clear up in a day or so.  SYMPTOMS TO REPORT IMMEDIATELY:   Following lower endoscopy (colonoscopy or flexible sigmoidoscopy):  Excessive amounts of blood in the stool  Significant tenderness or worsening of abdominal pains  Swelling of the abdomen that is new, acute  Fever of 100F or higher   Following upper endoscopy (EGD)  Vomiting of blood or coffee ground material  New chest pain or pain under the shoulder blades  Painful or persistently difficult swallowing  New shortness of breath  Fever of 100F or  higher  Black, tarry-looking stools  For urgent or emergent issues, a gastroenterologist can be reached at any hour by calling [phone].   DIET:  We do recommend a small meal at first, but then you may proceed to your regular diet.  Drink plenty of fluids but you should avoid alcoholic beverages for 24 hours.  ACTIVITY:  You should plan to take it easy for the rest of today and you should NOT DRIVE or use heavy machinery until tomorrow (because of the sedation medicines used during the test).    FOLLOW UP: Our staff will call the number listed on your records the next business day following your procedure to check on you and address any questions or concerns that you may have regarding the information given to you following your procedure. If we do not reach you, we will leave a message.  However, if you are feeling well and you are not experiencing any problems, there is no need to return our call.  We will assume that you have returned to your regular daily activities without incident.  If any biopsies were taken you will be contacted by phone or by letter within the next 1-3 weeks.  Please call us at [phone] if you have not heard about the biopsies in 3 weeks.    SIGNATURES/CONFIDENTIALITY: You and/or your care partner have signed paperwork which will be entered into your electronic medical record.  These signatures attest to  the fact that that the information above on your After Visit Summary has been reviewed and is understood.  Full responsibility of the confidentiality of this discharge information lies with you and/or your care-partner.  Hiatal hernia information given.

## 2016-07-22 ENCOUNTER — Telehealth: Payer: Self-pay | Admitting: *Deleted

## 2016-07-22 NOTE — Telephone Encounter (Signed)
  Follow up Call-  Call back number 07/21/2016  Post procedure Call Back phone  # [phone]  Permission to leave phone message Yes  Some recent data might be hidden     Patient questions:  Do you have a fever, pain , or abdominal swelling? No. Pain Score  0 *  Have you tolerated food without any problems? Yes.    Have you been able to return to your normal activities? Yes.    Do you have any questions about your discharge instructions: Diet   No. Medications  No. Follow up visit  No.  Do you have questions or concerns about your Care? No.  Actions: * If pain score is 4 or above: No action needed, pain <4.

## 2016-07-30 ENCOUNTER — Ambulatory Visit (INDEPENDENT_AMBULATORY_CARE_PROVIDER_SITE_OTHER): Payer: PPO | Admitting: Family Medicine

## 2016-07-30 ENCOUNTER — Encounter: Payer: Self-pay | Admitting: Family Medicine

## 2016-07-30 VITALS — BP 120/70 | HR 62 | Temp 97.7°F | Wt 216.8 lb

## 2016-07-30 DIAGNOSIS — R197 Diarrhea, unspecified: Secondary | ICD-10-CM | POA: Diagnosis not present

## 2016-07-30 NOTE — Progress Notes (Signed)
   Subjective:    [name], male    DOB: 1940-11-22, 76 y.o.   MRN: [id number]  HPI Chief Complaint  Patient presents with  . stomach pain    stomach pain- started this past sunday, stomach is rolling, some diarrhea. feels like this has happen since procedure on 4/11   He is here with complaints of a 4-5 day history  2 episodes of  Appetite is better  He ate a banana this morning.  Toast. Macaroni and cheese and wings, broccoli   Diarrhea watery and brown stool 2 episodes.   At night he has rumbling. He gets up at 3 am to go to her bathroom.   Denies fever, chills,   States his brother died last week.    Review of Systems Pertinent positives and negatives in the history of present illness.     Objective:   Physical Exam  Constitutional: He is oriented to person, place, and time. He appears well-developed and well-nourished. No distress.  HENT:  Mouth/Throat: Oropharynx is clear and moist and mucous membranes are normal.  Eyes: Conjunctivae and lids are normal.  Neck: Normal range of motion. Neck supple.  Cardiovascular: Normal rate, regular rhythm, normal heart sounds and normal pulses.   Pulmonary/Chest: Effort normal and breath sounds normal.  Abdominal: Soft. Normal appearance. He exhibits no distension. Bowel sounds are increased. There is no hepatosplenomegaly. There is no tenderness. There is no rigidity, no rebound, no guarding, no CVA tenderness, no tenderness at McBurney's point and negative Murphy's sign.  Lymphadenopathy:    He has no cervical adenopathy.       Right: No supraclavicular adenopathy present.       Left: No supraclavicular adenopathy present.  Neurological: He is alert and oriented to person, place, and time. He has normal strength. No cranial nerve deficit or sensory deficit. Gait normal.  Skin: Skin is warm and dry. No rash noted. No pallor.  Psychiatric: He has a normal mood and affect. His speech is normal and behavior is  normal. Thought content normal.   BP 120/70   Pulse (!) 53   Temp 97.7 F (36.5 C) (Oral)   Wt 216 lb 12.8 oz (98.3 kg)   BMI 32.02 kg/m      Assessment & Plan:  Diarrhea, unspecified type  Discussed that he does not appear to be infectious or in any danger.  Will have him stop taking milk of magnesium and avoid any fried, spicy food and dairy products for the next 48 hours.  He will try taking Immodium and stay well hydrated. Asked him to call me Monday and let me know how he is doing. He is aware that if he gets worse over the weekend such as developing fever, vomiting or worsening pain then he may have to go to the emergency department but I do not expect that to happen.

## 2016-07-30 NOTE — Progress Notes (Signed)
Biopsies from stomach are ok - call from office and let him know  He needs a CT abd/pelvis with contrast if possible - has slight elevation of creatinine - we need to do BUN and creatinine and see if he can do w/ contrast - if not do without Reason: uninitentional weight loss, hx elevated lipase

## 2016-07-30 NOTE — Patient Instructions (Signed)
Do not take any more milk of magnesium, this causes diarrhea actually. Avoid any fried, spicy food and dairy products for the next 48 hours.   Try taking Immodium and staying well hydrated. Call me Monday and let me know how you are doing. If you get worse over the weekend such as develop fever, vomiting or worsening pain then you may have to go to the emergency department but I do not expect that to happen.    Bland Diet A bland diet consists of foods that do not have a lot of fat or fiber. Foods without fat or fiber are easier for the body to digest. They are also less likely to irritate your mouth, throat, stomach, and other parts of your gastrointestinal tract. A bland diet is sometimes called a BRAT diet. What is my plan? Your health care provider or dietitian may recommend specific changes to your diet to prevent and treat your symptoms, such as:  Eating small meals often.  Cooking food until it is soft enough to chew easily.  Chewing your food well.  Drinking fluids slowly.  Not eating foods that are very spicy, sour, or fatty.  Not eating citrus fruits, such as oranges and grapefruit. What do I need to know about this diet?  Eat a variety of foods from the bland diet food list.  Do not follow a bland diet longer than you have to.  Ask your health care provider whether you should take vitamins. What foods can I eat? Grains   Hot cereals, such as cream of wheat. Bread, crackers, or tortillas made from refined white flour. Rice. Vegetables  Canned or cooked vegetables. Mashed or boiled potatoes. Fruits  Bananas. Applesauce. Other types of cooked or canned fruit with the skin and seeds removed, such as canned peaches or pears. Meats and Other Protein Sources  Scrambled eggs. Creamy peanut butter or other nut butters. Lean, well-cooked meats, such as chicken or fish. Tofu. Soups or broths. Dairy  Low-fat dairy products, such as milk, cottage cheese, or yogurt. Beverages    Water. Herbal tea. Apple juice. Sweets and Desserts  Pudding. Custard. Fruit gelatin. Ice cream. Fats and Oils  Mild salad dressings. Canola or olive oil. The items listed above may not be a complete list of allowed foods or beverages. Contact your dietitian for more options.  What foods are not recommended? Foods and ingredients that are often not recommended include:  Spicy foods, such as hot sauce or salsa.  Fried foods.  Sour foods, such as pickled or fermented foods.  Raw vegetables or fruits, especially citrus or berries.  Caffeinated drinks.  Alcohol.  Strongly flavored seasonings or condiments. The items listed above may not be a complete list of foods and beverages that are not allowed. Contact your dietitian for more information.  This information is not intended to replace advice given to you by your health care provider. Make sure you discuss any questions you have with your health care provider. Document Released: 07/21/2015 Document Revised: 09/04/2015 Document Reviewed: 04/10/2014 Elsevier Interactive Patient Education  2017 [employer].

## 2016-08-02 ENCOUNTER — Telehealth: Payer: Self-pay | Admitting: Internal Medicine

## 2016-08-02 NOTE — Progress Notes (Signed)
OK I understand  Please ask him to see his PCP in f/u  I will cc her also

## 2016-08-02 NOTE — Telephone Encounter (Signed)
Thanks for the update. I'm happy to hear this.

## 2016-08-02 NOTE — Telephone Encounter (Signed)
Pt called to give a report stating that he is doing well now and no more diarrhea or no more stomach rolling. He is back to normal and can eat food again

## 2016-08-11 ENCOUNTER — Ambulatory Visit (INDEPENDENT_AMBULATORY_CARE_PROVIDER_SITE_OTHER): Payer: PPO | Admitting: Podiatry

## 2016-08-11 ENCOUNTER — Encounter: Payer: Self-pay | Admitting: Podiatry

## 2016-08-11 DIAGNOSIS — L608 Other nail disorders: Secondary | ICD-10-CM

## 2016-08-11 DIAGNOSIS — M79609 Pain in unspecified limb: Secondary | ICD-10-CM

## 2016-08-11 DIAGNOSIS — B351 Tinea unguium: Secondary | ICD-10-CM

## 2016-08-11 DIAGNOSIS — E0842 Diabetes mellitus due to underlying condition with diabetic polyneuropathy: Secondary | ICD-10-CM

## 2016-08-11 DIAGNOSIS — L603 Nail dystrophy: Secondary | ICD-10-CM | POA: Diagnosis not present

## 2016-08-13 NOTE — Progress Notes (Signed)
   SUBJECTIVE Patient with a history of diabetes mellitus presents to office today complaining of elongated, thickened left third toenail. Pain while ambulating in shoes. Patient is unable to trim the nail on his own.   OBJECTIVE General Patient is awake, alert, and oriented x 3 and in no acute distress. Derm Skin is dry and supple bilateral. Negative open lesions or macerations. Remaining integument unremarkable. Left third toenail is tender, long, thickened and dystrophic with subungual debris, consistent with onychomycosis. No signs of infection noted. Vasc  DP and PT pedal pulses palpable bilaterally. Temperature gradient within normal limits.  Neuro Epicritic and protective threshold sensation diminished bilaterally.  Musculoskeletal Exam No symptomatic pedal deformities noted bilateral. Muscular strength within normal limits.  ASSESSMENT 1. Diabetes Mellitus w/ peripheral neuropathy 2. Onychomycosis of left third toe nail due to dermatophyte   PLAN OF CARE 1. Patient evaluated today. 2. Instructed to maintain good pedal hygiene and foot care. Stressed importance of controlling blood sugar.  3. Mechanical debridement of left third toenail performed using a nail nipper. Filed with dremel without incident.  4. Return to clinic when necessary.     Edrick Kins, DPM [facility]  Dr. Edrick Kins, [address]                                        [place], [place] [address]                Office [phone]  Fax [phone]

## 2016-08-18 ENCOUNTER — Telehealth: Payer: Self-pay | Admitting: Family Medicine

## 2016-08-18 MED ORDER — TRAMADOL HCL 50 MG PO TABS: 50.0000 mg | ORAL_TABLET | Freq: Four times a day (QID) | ORAL | 0 refills | Status: DC | PRN

## 2016-08-18 NOTE — Telephone Encounter (Signed)
Pt called for refills of Tramadol. Please send to [facility] on [address]. Pt can be reached at [phone].

## 2016-08-18 NOTE — Telephone Encounter (Signed)
Called in med to pharmacy  

## 2016-08-18 NOTE — Telephone Encounter (Signed)
This has already been called into pharmacy

## 2016-08-18 NOTE — Telephone Encounter (Signed)
Ok to refill 

## 2016-08-18 NOTE — Telephone Encounter (Signed)
Did you take care of this? 

## 2016-08-18 NOTE — Telephone Encounter (Signed)
Pt called and is requesting a refill on his tramadol pt uses [facility], [place] pt can be reached at [phone]

## 2016-08-24 DIAGNOSIS — R51 Headache: Secondary | ICD-10-CM | POA: Diagnosis not present

## 2016-08-29 ENCOUNTER — Other Ambulatory Visit: Payer: Self-pay | Admitting: Cardiovascular Disease

## 2016-09-01 ENCOUNTER — Encounter (HOSPITAL_COMMUNITY): Payer: PPO

## 2016-09-01 ENCOUNTER — Other Ambulatory Visit (HOSPITAL_COMMUNITY): Payer: PPO

## 2016-09-01 ENCOUNTER — Ambulatory Visit: Payer: PPO | Admitting: Vascular Surgery

## 2016-09-10 ENCOUNTER — Encounter: Payer: Self-pay | Admitting: Family Medicine

## 2016-09-10 ENCOUNTER — Ambulatory Visit (INDEPENDENT_AMBULATORY_CARE_PROVIDER_SITE_OTHER): Payer: PPO | Admitting: Family Medicine

## 2016-09-10 ENCOUNTER — Other Ambulatory Visit: Payer: Self-pay | Admitting: Family Medicine

## 2016-09-10 VITALS — BP 120/62 | HR 48 | Temp 97.6°F | Wt 219.6 lb

## 2016-09-10 DIAGNOSIS — I10 Essential (primary) hypertension: Secondary | ICD-10-CM

## 2016-09-10 DIAGNOSIS — R350 Frequency of micturition: Secondary | ICD-10-CM

## 2016-09-10 DIAGNOSIS — R7303 Prediabetes: Secondary | ICD-10-CM | POA: Diagnosis not present

## 2016-09-10 DIAGNOSIS — R42 Dizziness and giddiness: Secondary | ICD-10-CM | POA: Diagnosis not present

## 2016-09-10 LAB — COMPREHENSIVE METABOLIC PANEL
ALT: 11 U/L (ref 9–46)
AST: 12 U/L (ref 10–35)
Albumin: 4 g/dL (ref 3.6–5.1)
Alkaline Phosphatase: 63 U/L (ref 40–115)
BUN: 14 mg/dL (ref 7–25)
CO2: 27 mmol/L (ref 20–31)
Calcium: 9.4 mg/dL (ref 8.6–10.3)
Chloride: 104 mmol/L (ref 98–110)
Creat: 1.23 mg/dL — ABNORMAL HIGH (ref 0.70–1.18)
Glucose, Bld: 85 mg/dL (ref 65–99)
Potassium: 4.4 mmol/L (ref 3.5–5.3)
Sodium: 139 mmol/L (ref 135–146)
Total Bilirubin: 0.3 mg/dL (ref 0.2–1.2)
Total Protein: 6.7 g/dL (ref 6.1–8.1)

## 2016-09-10 LAB — CBC WITH DIFFERENTIAL/PLATELET
Basophils Absolute: 35 cells/uL (ref 0–200)
Basophils Relative: 1 %
Eosinophils Absolute: 245 cells/uL (ref 15–500)
Eosinophils Relative: 7 %
HCT: 34.7 % — ABNORMAL LOW (ref 38.5–50.0)
Hemoglobin: 10.7 g/dL — ABNORMAL LOW (ref 13.2–17.1)
Lymphocytes Relative: 22 %
Lymphs Abs: 770 cells/uL — ABNORMAL LOW (ref 850–3900)
MCH: 21.2 pg — ABNORMAL LOW (ref 27.0–33.0)
MCHC: 30.8 g/dL — ABNORMAL LOW (ref 32.0–36.0)
MCV: 68.8 fL — ABNORMAL LOW (ref 80.0–100.0)
MPV: 8.8 fL (ref 7.5–12.5)
Monocytes Absolute: 385 cells/uL (ref 200–950)
Monocytes Relative: 11 %
Neutro Abs: 2065 cells/uL (ref 1500–7800)
Neutrophils Relative %: 59 %
Platelets: 162 10*3/uL (ref 140–400)
RBC: 5.04 MIL/uL (ref 4.20–5.80)
RDW: 18.5 % — ABNORMAL HIGH (ref 11.0–15.0)
WBC: 3.5 10*3/uL — ABNORMAL LOW (ref 4.0–10.5)

## 2016-09-10 LAB — POCT URINALYSIS DIPSTICK
Bilirubin, UA: NEGATIVE
Blood, UA: NEGATIVE
Glucose, UA: NEGATIVE
Ketones, UA: NEGATIVE
Leukocytes, UA: NEGATIVE
Nitrite, UA: NEGATIVE
Protein, UA: NEGATIVE
Spec Grav, UA: 1.015 (ref 1.010–1.025)
Urobilinogen, UA: NEGATIVE E.U./dL — AB
pH, UA: 6 (ref 5.0–8.0)

## 2016-09-10 LAB — GLUCOSE, POCT (MANUAL RESULT ENTRY): POC Glucose: 93 mg/dl (ref 70–99)

## 2016-09-10 LAB — POCT GLYCOSYLATED HEMOGLOBIN (HGB A1C): Hemoglobin A1C: 6

## 2016-09-10 NOTE — Progress Notes (Signed)
Subjective:    [name], male    DOB: 1940-06-20, 76 y.o.   MRN: [id number]  HPI Chief Complaint  Patient presents with  . blood pressure    blood pressure going from 120-157 / 62-90 since monday. feeling tired and lazy   He is here with complaints of his BP fluctuating at home and feeling tired for the past 3 days. States he had an episode of dizziness  when he stood up quickly yesterday. It lasted for only a few seconds and resolved spontaneously. Denies dizziness today. States he actually feels back to his norm today.  BP at home 130/69 today. 140/74 this morning, 152/67 1 pm yesterday, 122/69, 120/64, 147/67.  Also complains of urinary frequency for the past 2-3 days. No increased thirst or unexplained weight loss. He is taking Flomax for BPH. States he is due to start treatment soon for his prostate cancer.   States he has not been eating a lot of salt. He does reports a diet high in sugar over the past week. Has been eating at least navel oranges daily and ice cream. He also has not been taking his Metformin for the past 3-4 months. Questions whether his blood sugar may be elevated. His A1c has been in the prediabetes range and he does not check his blood sugars at home.  States he has also been eating salads and homemade beef stew.   Denies fever, chills, body aches, URI symptoms, vision changes, chest pain, palpitations, shortness of breath, orthopnea, cough, abdominal pain, N/V/D. No LE edema.   Reviewed allergies, medications, past medical, surgical, and social history.   Review of Systems Pertinent positives and negatives in the history of present illness.     Objective:   Physical Exam  Constitutional: He is oriented to person, place, and time. He appears well-developed and well-nourished. He does not have a sickly appearance. No distress.  HENT:  Mouth/Throat: Uvula is midline, oropharynx is clear and moist and mucous membranes are normal.  Eyes:  Conjunctivae, EOM and lids are normal. Pupils are equal, round, and reactive to light.  Neck: Normal range of motion. Neck supple. No JVD present.  Cardiovascular: Normal rate, normal heart sounds and normal pulses.   No LE edema  Pulmonary/Chest: Effort normal and breath sounds normal.  Lymphadenopathy:    He has no cervical adenopathy.  Neurological: He is alert and oriented to person, place, and time. He has normal strength and normal reflexes. He displays no tremor. No cranial nerve deficit or sensory deficit. He displays a negative Romberg sign. Coordination and gait normal.  Normal finger to nose  Skin: Skin is warm and dry. No rash noted. No pallor.  Psychiatric: He has a normal mood and affect. His speech is normal and behavior is normal. Thought content normal. Cognition and memory are normal.   BP 120/62   Pulse (!) 48   Temp 97.6 F (36.4 C) (Oral)   Wt 219 lb 9.6 oz (99.6 kg)   BMI 32.43 kg/m       Assessment & Plan:  Essential hypertension - Plan: CBC with Differential/Platelet, Comprehensive metabolic panel  Prediabetes - Plan: POCT glycosylated hemoglobin (Hb A1C), POCT glucose (manual entry), CBC with Differential/Platelet, Comprehensive metabolic panel  Urinary frequency - Plan: POCT urinalysis dipstick, POCT glycosylated hemoglobin (Hb A1C), POCT glucose (manual entry)  Episode of dizziness - Plan: Orthostatic vital signs, POCT glycosylated hemoglobin (Hb A1C), POCT glucose (manual entry), CBC with Differential/Platelet, Comprehensive metabolic panel  Neuro exam is normal.  He is not orthostatic.  PCOT glucose 93 Hemoglobin A1c- 6.0% UA- spec grav 1.015 and neg BP is within goal range and consistent with his usual readings.  Blood sugars appear to be doing fine even though he has not been taking Metformin. He would like to stop taking this and I am ok with this for now but he is aware that if his blood sugars start to increase he may need to go back on the  medication.  Since he is feeling back to his baseline today I recommend watchful waiting and paying closer attention to the amount of sugary foods and carbohydrates and making sure he is eating protein in order to have fewer fluctuations in his blood sugars. He will continue to keep check his BP at home and report back any low or high readings. Recommend he change positions slowly.  He will follow up as needed.

## 2016-09-13 ENCOUNTER — Other Ambulatory Visit: Payer: Self-pay | Admitting: Family Medicine

## 2016-09-13 DIAGNOSIS — D581 Hereditary elliptocytosis: Secondary | ICD-10-CM | POA: Insufficient documentation

## 2016-09-13 DIAGNOSIS — D509 Iron deficiency anemia, unspecified: Secondary | ICD-10-CM

## 2016-09-13 LAB — IRON,TIBC AND FERRITIN PANEL
%SAT: 19 % (ref 15–60)
Ferritin: 222 ng/mL (ref 20–380)
Iron: 52 ug/dL (ref 50–180)
TIBC: 273 ug/dL (ref 250–425)

## 2016-09-21 ENCOUNTER — Encounter: Payer: Self-pay | Admitting: Hematology

## 2016-09-21 ENCOUNTER — Encounter: Payer: Self-pay | Admitting: Internal Medicine

## 2016-09-21 DIAGNOSIS — H40023 Open angle with borderline findings, high risk, bilateral: Secondary | ICD-10-CM | POA: Diagnosis not present

## 2016-09-21 DIAGNOSIS — Z9841 Cataract extraction status, right eye: Secondary | ICD-10-CM | POA: Diagnosis not present

## 2016-09-21 DIAGNOSIS — H2512 Age-related nuclear cataract, left eye: Secondary | ICD-10-CM | POA: Diagnosis not present

## 2016-09-21 DIAGNOSIS — H11153 Pinguecula, bilateral: Secondary | ICD-10-CM | POA: Diagnosis not present

## 2016-09-21 DIAGNOSIS — H18413 Arcus senilis, bilateral: Secondary | ICD-10-CM | POA: Diagnosis not present

## 2016-09-21 DIAGNOSIS — H11423 Conjunctival edema, bilateral: Secondary | ICD-10-CM | POA: Diagnosis not present

## 2016-09-21 DIAGNOSIS — Z961 Presence of intraocular lens: Secondary | ICD-10-CM | POA: Diagnosis not present

## 2016-09-21 DIAGNOSIS — H25012 Cortical age-related cataract, left eye: Secondary | ICD-10-CM | POA: Diagnosis not present

## 2016-09-21 DIAGNOSIS — H5340 Unspecified visual field defects: Secondary | ICD-10-CM | POA: Diagnosis not present

## 2016-09-21 DIAGNOSIS — H40003 Preglaucoma, unspecified, bilateral: Secondary | ICD-10-CM | POA: Diagnosis not present

## 2016-09-21 DIAGNOSIS — H04123 Dry eye syndrome of bilateral lacrimal glands: Secondary | ICD-10-CM | POA: Diagnosis not present

## 2016-09-21 DIAGNOSIS — H35033 Hypertensive retinopathy, bilateral: Secondary | ICD-10-CM | POA: Diagnosis not present

## 2016-09-21 LAB — HM DIABETES EYE EXAM

## 2016-09-22 ENCOUNTER — Encounter: Payer: Self-pay | Admitting: Family Medicine

## 2016-09-30 ENCOUNTER — Ambulatory Visit (HOSPITAL_BASED_OUTPATIENT_CLINIC_OR_DEPARTMENT_OTHER): Payer: PPO

## 2016-09-30 ENCOUNTER — Telehealth: Payer: Self-pay | Admitting: Hematology

## 2016-09-30 ENCOUNTER — Encounter: Payer: Self-pay | Admitting: Hematology

## 2016-09-30 ENCOUNTER — Ambulatory Visit (HOSPITAL_BASED_OUTPATIENT_CLINIC_OR_DEPARTMENT_OTHER): Payer: PPO | Admitting: Hematology

## 2016-09-30 ENCOUNTER — Telehealth: Payer: Self-pay | Admitting: *Deleted

## 2016-09-30 VITALS — BP 133/52 | HR 64 | Temp 98.1°F | Resp 20 | Ht 69.0 in | Wt 219.7 lb

## 2016-09-30 DIAGNOSIS — D509 Iron deficiency anemia, unspecified: Secondary | ICD-10-CM | POA: Diagnosis not present

## 2016-09-30 DIAGNOSIS — I10 Essential (primary) hypertension: Secondary | ICD-10-CM

## 2016-09-30 DIAGNOSIS — N183 Chronic kidney disease, stage 3 (moderate): Secondary | ICD-10-CM

## 2016-09-30 LAB — COMPREHENSIVE METABOLIC PANEL
ALT: 15 U/L (ref 0–55)
AST: 15 U/L (ref 5–34)
Albumin: 3.9 g/dL (ref 3.5–5.0)
Alkaline Phosphatase: 68 U/L (ref 40–150)
Anion Gap: 9 mEq/L (ref 3–11)
BUN: 17.8 mg/dL (ref 7.0–26.0)
CO2: 27 mEq/L (ref 22–29)
Calcium: 9.7 mg/dL (ref 8.4–10.4)
Chloride: 105 mEq/L (ref 98–109)
Creatinine: 1.5 mg/dL — ABNORMAL HIGH (ref 0.7–1.3)
EGFR: 51 mL/min/{1.73_m2} — ABNORMAL LOW (ref >90)
Glucose: 85 mg/dl (ref 70–140)
Potassium: 4.3 mEq/L (ref 3.5–5.1)
Sodium: 142 mEq/L (ref 136–145)
Total Bilirubin: 0.26 mg/dL (ref 0.20–1.20)
Total Protein: 7.1 g/dL (ref 6.4–8.3)

## 2016-09-30 LAB — CBC & DIFF AND RETIC
BASO%: 0.5 % (ref 0.0–2.0)
Basophils Absolute: 0 10*3/uL (ref 0.0–0.1)
EOS%: 6.7 % (ref 0.0–7.0)
Eosinophils Absolute: 0.3 10*3/uL (ref 0.0–0.5)
HCT: 34.7 % — ABNORMAL LOW (ref 38.4–49.9)
HGB: 10.8 g/dL — ABNORMAL LOW (ref 13.0–17.1)
Immature Retic Fract: 7.1 % (ref 3.00–10.60)
LYMPH%: 18.3 % (ref 14.0–49.0)
MCH: 21.7 pg — ABNORMAL LOW (ref 27.2–33.4)
MCHC: 31.1 g/dL — ABNORMAL LOW (ref 32.0–36.0)
MCV: 69.8 fL — ABNORMAL LOW (ref 79.3–98.0)
MONO#: 0.5 10*3/uL (ref 0.1–0.9)
MONO%: 11.6 % (ref 0.0–14.0)
NEUT#: 2.6 10*3/uL (ref 1.5–6.5)
NEUT%: 62.9 % (ref 39.0–75.0)
Platelets: 134 10*3/uL — ABNORMAL LOW (ref 140–400)
RBC: 4.97 10*6/uL (ref 4.20–5.82)
RDW: 17.3 % — ABNORMAL HIGH (ref 11.0–14.6)
Retic %: 1.26 % (ref 0.80–1.80)
Retic Ct Abs: 62.62 10*3/uL (ref 34.80–93.90)
WBC: 4.2 10*3/uL (ref 4.0–10.3)
lymph#: 0.8 10*3/uL — ABNORMAL LOW (ref 0.9–3.3)
nRBC: 0 % (ref 0–0)

## 2016-09-30 LAB — CHCC SMEAR

## 2016-09-30 NOTE — Patient Instructions (Signed)
Thank you for choosing [facility] to provide your oncology and hematology care.  To afford each patient quality time with our providers, please arrive 30 minutes before your scheduled appointment time.  If you arrive late for your appointment, you may be asked to reschedule.  We strive to give you quality time with our providers, and arriving late affects you and other patients whose appointments are after yours.   If you are a no show for multiple scheduled visits, you may be dismissed from the clinic at the providers discretion.    Again, thank you for choosing [facility], our hope is that these requests will decrease the amount of time that you wait before being seen by our physicians.  ______________________________________________________________________  Should you have questions after your visit to the [facility], please contact our office at [phone] between the hours of 8:30 and 4:30 p.m.    Voicemails left after 4:30p.m will not be returned until the following business day.    For prescription refill requests, please have your pharmacy contact us directly.  Please also try to allow 48 hours for prescription requests.    Please contact the scheduling department for questions regarding scheduling.  For scheduling of procedures such as PET scans, CT scans, MRI, Ultrasound, etc please contact central scheduling at [phone].    Resources For Cancer Patients and Caregivers:   Oncolink.org:  A wonderful resource for patients and healthcare providers for information regarding your disease, ways to tract your treatment, what to expect, etc.     American Cancer Society:  800-227-2345  Can help patients locate various types of support and financial assistance  Cancer Care: 1-800-813-HOPE (4673) Provides financial assistance, online support groups, medication/co-pay assistance.    [place] DSS:  336-641-3447 Where to apply for food  stamps, Medicaid, and utility assistance  Medicare Rights Center: 800-333-4114 Helps people with Medicare understand their rights and benefits, navigate the Medicare system, and secure the quality healthcare they deserve  SCAT: 336-333-6589 [place] [employer]'s shared-ride transportation service for eligible riders who have a disability that prevents them from riding the fixed route bus.    For additional information on assistance programs please contact our social worker:   Grier Hock/Abigail Elmore:  [phone]            

## 2016-09-30 NOTE — Progress Notes (Signed)
Marland Kitchen    HEMATOLOGY/ONCOLOGY CONSULTATION NOTE  Date of Service: 09/30/2016  Patient Care Team: Girtha Rm, NP-C as PCP - General (Family Medicine) Burnell Blanks, MD as Consulting Physician (Cardiology) Clinic, [facility]  CHIEF COMPLAINTS/PURPOSE OF CONSULTATION:  Anemia  HISTORY OF PRESENTING ILLNESS:   Joel [name] is a wonderful 76 y.o. male who has been referred to [place] by Dr .Raenette Rover, Laurian Brim, NP-C  for evaluation and management of microcytic anemia.  Patient has a history of multiple medical comorbidities including hypertension, dyslipidemia, coronary artery disease status post PCI, chronic kidney disease stage III, COPD, DVT and previous history of prostate cancer.  He was recently seen by his primary care physician and had a CBC which showed a hemoglobin of 10.7 with an MCV of 68 WBC count of 3.5k with a platelet count of 162k. RBC's 5,040,000/mcL suggesting relative polycythemia. Patient on review of labs has had a history of mild chronic anemia for several years on and off. He is noted to have microcytic MCV even with hemoglobin more than 12 on previous occasions. This would suggest possible thalassemia trait especially in the setting of relative polycythemia. Patient had iron labs done on 09/10/2016 which showed a ferritin of 222 with an iron saturation of 19%. Patient reports he is going on ferrous sulfate 1 tablet by mouth daily for months. Patient reports that he follows up at the [place] and [facility] and is currently on Lupron for his prostate cancer as per his urologist.  No overt weight loss. No focal bone pains. No other acute new focal symptoms.   MEDICAL HISTORY:  Past Medical History:  Diagnosis Date  . Atypical chest pain   . CAD (coronary artery disease)    a. Multiple prior evaluations then 05/2012 s/p BMS to OM2. b. Nuc 11/2012 wnl. c. Cath 2015: patent stent, moderate LAD/RCA dz, treated medically.  . Cancer ([place])    Hx: of prostate  .  Cataract   . CKD (chronic kidney disease), stage II   . COPD (chronic obstructive pulmonary disease) ([place])   . Cough secondary to angiotensin converting enzyme inhibitor (ACE-I)   . Diabetes mellitus without complication ([place])   . Diverticulosis   . DVT (deep venous thrombosis) ([place])   . GERD (gastroesophageal reflux disease)   . Headache(784.0)    Hx: of  . History of blood transfusion    "I've had 2; don't know what it was related to" (11/09/2012)  . HLD (hyperlipidemia)   . HTN (hypertension)   . Iron deficiency anemia   . Microcytic anemia   . Peripheral vascular disease ([place])    a. L pop-tibial bypass 2011, followed by [facility].  . Rotator cuff injury    "left arm; never repaired" (11/09/2012)    SURGICAL HISTORY: Past Surgical History:  Procedure Laterality Date  . CARDIOVASCULAR STRESS TEST  Aug. 2014  . CATARACT EXTRACTION W/ INTRAOCULAR LENS IMPLANT Right ~ 2008  . CIRCUMCISION    . COLONOSCOPY    . CORONARY ANGIOPLASTY WITH STENT PLACEMENT  2014   "1" (11/09/2012)  . ILIAC ARTERY ANEURYSM REPAIR    . LEFT HEART CATH  05-31-12  . LEFT HEART CATHETERIZATION WITH CORONARY ANGIOGRAM N/A 06/01/2011   Procedure: LEFT HEART CATHETERIZATION WITH CORONARY ANGIOGRAM;  Surgeon: Hillary Bow, MD;  Location: [facility] CATH LAB;  Service: Cardiovascular;  Laterality: N/A;  . LEFT HEART CATHETERIZATION WITH CORONARY ANGIOGRAM N/A 05/31/2012   Procedure: LEFT HEART CATHETERIZATION WITH CORONARY ANGIOGRAM;  Surgeon: Collier Salina  M Martinique, MD;  Location: [facility] CATH LAB;  Service: Cardiovascular;  Laterality: N/A;  . LEFT HEART CATHETERIZATION WITH CORONARY ANGIOGRAM N/A 08/01/2013   Procedure: LEFT HEART CATHETERIZATION WITH CORONARY ANGIOGRAM;  Surgeon: Burnell Blanks, MD;  Location[facility] CATH LAB;  Service: Cardiovascular;  Laterality: N/A;  . PERCUTANEOUS CORONARY STENT INTERVENTION (PCI-S)  05/31/2012   Procedure: PERCUTANEOUS CORONARY STENT INTERVENTION (PCI-S);  Surgeon: Peter M Martinique, MD;   Location: [facility] CATH LAB;  Service: Cardiovascular;;  . PR VEIN BYPASS GRAFT,AORTO-FEM-POP Left 10/01/2009   left below knee popliteal artery to posterior tibial artery  . SHOULDER ARTHROSCOPY WITH OPEN ROTATOR CUFF REPAIR AND DISTAL CLAVICLE ACROMINECTOMY Left 01/12/2013   Procedure: LEFT SHOULDER ARTHROSCOPY WITH DEBRIDEMENT OPEN DISTAL CLAVICLE RESECTION ,acromioplastyAND ROTATOR CUFF REPAIR;  Surgeon: Yvette Rack., MD;  Location: [place];  Service: Orthopedics;  Laterality: Left;    SOCIAL HISTORY: Social History   Social History  . Marital status: Widowed    Spouse name: N/A  . Number of children: N/A  . Years of education: N/A   Occupational History  . Retired    Social History Main Topics  . Smoking status: Former Smoker    Packs/day: 1.00    Years: 55.00    Types: Cigarettes    Quit date: 05/26/2011  . Smokeless tobacco: Never Used  . Alcohol use No  . Drug use: No  . Sexual activity: Not on file     Comment: 11/09/2012 "I don't have to answer questions about sex"   Other Topics Concern  . Not on file   Social History Narrative   Single. Retired. 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  Lives in [place] by himself.    FAMILY HISTORY: Family History  Problem Relation Age of Onset  . Breast cancer Mother        deceased  . Hyperlipidemia Mother   . Hypertension Mother   . COPD Father        deceased  . Cancer Father   . Hyperlipidemia Father   . Hypertension Father   . Hypertension Sister   . Cancer Brother   . Hypertension Brother   . Hypertension Daughter   . Hypertension Son   . Colon cancer Neg Hx        pt is unclear about family hx    ALLERGIES:  is allergic to atenolol; imdur [isosorbide mononitrate]; lisinopril; topiramate; and tramadol-acetaminophen.  MEDICATIONS:  Current Outpatient Prescriptions  Medication Sig Dispense Refill  . amLODipine (NORVASC) 10 MG tablet Take 1 tablet (10 mg total) by mouth daily. 30 tablet 5  . aspirin EC 81 MG tablet Take 81  mg by mouth daily.     . ferrous sulfate 325 (65 FE) MG tablet Take 325 mg by mouth daily with breakfast.    . gabapentin (NEURONTIN) 100 MG capsule TAKE 1 CAPSULE BY MOUTH AS NEEDED FOR HEADACHE PAIN. MAY REPEAT AFTER 2 HOURS IF NEEDED  0  . gabapentin (NEURONTIN) 600 MG tablet Take 600 mg by mouth at bedtime.    Marland Kitchen losartan-hydrochlorothiazide (HYZAAR) 100-25 MG tablet take 1 tablet by mouth once daily 90 tablet 2  . metFORMIN (GLUCOPHAGE) 500 MG tablet Take 500 mg by mouth 2 (two) times daily with a meal. Reported on 08/27/2015    . nitroGLYCERIN (NITROSTAT) 0.4 MG SL tablet Place 1 tablet (0.4 mg total) under the tongue every 5 (five) minutes as needed for chest pain. 25 tablet 2  . omeprazole (PRILOSEC) 20 MG capsule Take  20 mg by mouth 2 (two) times daily before a meal.    . pravastatin (PRAVACHOL) 40 MG tablet Take 40 mg by mouth every evening.    Marland Kitchen PROAIR HFA 108 (90 Base) MCG/ACT inhaler inhale 2 puffs every 4 hours 8.5 g 1  . tamsulosin (FLOMAX) 0.4 MG CAPS capsule Take 0.4 mg by mouth daily after supper.     . traMADol (ULTRAM) 50 MG tablet Take 1 tablet (50 mg total) by mouth every 6 (six) hours as needed for moderate pain. 30 tablet 0   Current Facility-Administered Medications  Medication Dose Route Frequency Provider Last Rate Last Dose  . 0.9 %  sodium chloride infusion  500 mL Intravenous Continuous Gatha Mayer, MD        REVIEW OF SYSTEMS:    10 Point review of Systems was done is negative except as noted above.  PHYSICAL EXAMINATION: ECOG PERFORMANCE STATUS: 1 - Symptomatic but completely ambulatory  . Vitals:   09/30/16 1504  BP: (!) 133/52  Pulse: 64  Resp: 20  Temp: 98.1 F (36.7 C)   Filed Weights   09/30/16 1504  Weight: 219 lb 11.2 oz (99.7 kg)   .Body mass index is 32.44 kg/m.  GENERAL:alert, in no acute distress and comfortable SKIN: no acute rashes, no significant lesions EYES: conjunctiva are pink and non-injected, sclera  anicteric OROPHARYNX: MMM, no exudates, no oropharyngeal erythema or ulceration NECK: supple, no JVD LYMPH:  no palpable lymphadenopathy in the cervical, axillary or inguinal regions LUNGS: clear to auscultation b/l with normal respiratory effort HEART: regular rate & rhythm ABDOMEN:  normoactive bowel sounds , non tender, not distended. Extremity: no pedal edema PSYCH: alert & oriented x 3 with fluent speech NEURO: no focal motor/sensory deficits  LABORATORY DATA:  I have reviewed the data as listed  . CBC Latest Ref Rng & Units 09/10/2016 06/02/2016 05/11/2016  WBC 4.0 - 10.5 K/uL 3.5(L) 2.5(L) 5.1  Hemoglobin 13.2 - 17.1 g/dL 10.7(L) 10.9(L) 9.9(L)  Hematocrit 38.5 - 50.0 % 34.7(L) 34.3(L) 32.6(L)  Platelets 140 - 400 K/uL 162 130(L) 216   . CBC    Component Value Date/Time   WBC 3.5 (L) 09/10/2016 1311   RBC 5.04 09/10/2016 1311   HGB 10.7 (L) 09/10/2016 1311   HCT 34.7 (L) 09/10/2016 1311   PLT 162 09/10/2016 1311   MCV 68.8 (L) 09/10/2016 1311   MCH 21.2 (L) 09/10/2016 1311   MCHC 30.8 (L) 09/10/2016 1311   RDW 18.5 (H) 09/10/2016 1311   LYMPHSABS 770 (L) 09/10/2016 1311   MONOABS 385 09/10/2016 1311   EOSABS 245 09/10/2016 1311   BASOSABS 35 09/10/2016 1311    CMP Latest Ref Rng & Units 09/10/2016 06/02/2016 05/11/2016  Glucose 65 - 99 mg/dL 85 97 95  BUN 7 - 25 mg/dL 14 13 11   Creatinine 0.70 - 1.18 mg/dL 1.23(H) 1.36(H) 1.25(H)  Sodium 135 - 146 mmol/L 139 137 138  Potassium 3.5 - 5.3 mmol/L 4.4 3.9 4.1  Chloride 98 - 110 mmol/L 104 99 103  CO2 20 - 31 mmol/L 27 30 27   Calcium 8.6 - 10.3 mg/dL 9.4 9.0 9.3  Total Protein 6.1 - 8.1 g/dL 6.7 7.0 -  Total Bilirubin 0.2 - 1.2 mg/dL 0.3 0.4 -  Alkaline Phos 40 - 115 U/L 63 68 -  AST 10 - 35 U/L 12 16 -  ALT 9 - 46 U/L 11 16 -   . Lab Results  Component Value Date   IRON 52  09/10/2016   TIBC 273 09/10/2016   IRONPCTSAT 19 09/10/2016   (Iron and TIBC)  Lab Results  Component Value Date   FERRITIN 222  09/10/2016     RADIOGRAPHIC STUDIES: I have personally reviewed the radiological images as listed and agreed with the findings in the report. No results found.  ASSESSMENT & PLAN:   76 year old male with  #1 Mild Microcytic Anemia with relative polycythemia . Based on iron profile and ferritin level of 222 and iron saturation of 19% the patient does not appear to be overtly iron deficient at this time . I suspect he likely has thalassemia trait that explains his RBC microcytosis out of proportion to the anemia and relative polycythemia . Patient notes no overt bleeding . He is on Lupron for his prostate cancer as per his urologist . This could contribution to to a 1-2 g drop in hemoglobin as well. Plan -reasonable to continue Po iron for maintenance as per PCP to maintain ferritin >100 and Iron sat >20% -will send out additional workup for anemia as noted below including a hemoglobinopathy evaluation.  . Orders Placed This Encounter  Procedures  . CBC & Diff and Retic    Standing Status:   Future    Number of Occurrences:   1    Standing Expiration Date:   09/30/2017  . Comprehensive metabolic panel    Standing Status:   Future    Number of Occurrences:   1    Standing Expiration Date:   09/30/2017  . Smear    Standing Status:   Future    Number of Occurrences:   1    Standing Expiration Date:   09/30/2017  . Lactate dehydrogenase    Standing Status:   Future    Number of Occurrences:   1    Standing Expiration Date:   09/30/2017  . Haptoglobin    Standing Status:   Future    Number of Occurrences:   1    Standing Expiration Date:   09/30/2017  . Hemoglobinopathy evaluation    Standing Status:   Future    Number of Occurrences:   1    Standing Expiration Date:   09/30/2017  . Sedimentation rate    Standing Status:   Future    Number of Occurrences:   1    Standing Expiration Date:   09/30/2017  . Folate RBC    Standing Status:   Future    Number of Occurrences:   1     Standing Expiration Date:   09/30/2017   Labs today RTC with Dr Irene Limbo in 4 weeks  All of the patients questions were answered with apparent satisfaction. The patient knows to call the clinic with any problems, questions or concerns.  I spent 35 minutes counseling the patient face to face. The total time spent in the appointment was 45 minutes and more than 50% was on counseling and direct patient cares.    Sullivan Lone MD [place] AAHIVMS [facility] Hematology/Oncology Physician [facility]  (Office):       [phone] (Work cell):  [phone] (Fax):           [phone]  09/30/2016 3:18 PM

## 2016-09-30 NOTE — Telephone Encounter (Signed)
Fax request sent to medical records at [facility] in [place] for release of office notes/imaging/labs/etc.  Faxed to [phone].  Signed pt authorization for medical records release attached.

## 2016-09-30 NOTE — Telephone Encounter (Signed)
Scheduled appt per 6/21 los - Gave patient AVS and calender per los.  

## 2016-10-01 LAB — HEMOGLOBINOPATHY EVALUATION
HGB C: 0 %
HGB S: 0 %
HGB VARIANT: 0 %
Hemoglobin A2 Quantitation: 1.5 % — ABNORMAL LOW (ref 1.8–3.2)
Hemoglobin F Quantitation: 0 % (ref 0.0–2.0)
Hgb A: 98.5 % (ref 96.4–98.8)

## 2016-10-01 LAB — LACTATE DEHYDROGENASE: LDH: 225 U/L (ref 125–245)

## 2016-10-01 LAB — FOLATE RBC
Folate, Hemolysate: 368.2 ng/mL
Folate, RBC: 1099 ng/mL (ref >498)
Hematocrit: 33.5 % — ABNORMAL LOW (ref 37.5–51.0)

## 2016-10-01 LAB — SEDIMENTATION RATE: Sedimentation Rate-Westergren: 25 mm/hr (ref 0–30)

## 2016-10-01 LAB — HAPTOGLOBIN: Haptoglobin: 174 mg/dL (ref 34–200)

## 2016-10-11 ENCOUNTER — Encounter: Payer: Self-pay | Admitting: Vascular Surgery

## 2016-10-11 DIAGNOSIS — Z961 Presence of intraocular lens: Secondary | ICD-10-CM | POA: Diagnosis not present

## 2016-10-11 DIAGNOSIS — D2311 Other benign neoplasm of skin of right eyelid, including canthus: Secondary | ICD-10-CM | POA: Diagnosis not present

## 2016-10-11 DIAGNOSIS — H25812 Combined forms of age-related cataract, left eye: Secondary | ICD-10-CM | POA: Diagnosis not present

## 2016-10-20 ENCOUNTER — Ambulatory Visit (HOSPITAL_COMMUNITY)
Admission: RE | Admit: 2016-10-20 | Discharge: 2016-10-20 | Disposition: A | Discharge status: Home / Self Care | Payer: PPO | Source: Ambulatory Visit | Attending: Vascular Surgery | Admitting: Vascular Surgery

## 2016-10-20 ENCOUNTER — Ambulatory Visit (INDEPENDENT_AMBULATORY_CARE_PROVIDER_SITE_OTHER)
Admission: RE | Admit: 2016-10-20 | Discharge: 2016-10-20 | Disposition: A | Discharge status: Home / Self Care | Payer: PPO | Source: Ambulatory Visit | Attending: Vascular Surgery | Admitting: Vascular Surgery

## 2016-10-20 ENCOUNTER — Encounter: Payer: Self-pay | Admitting: Vascular Surgery

## 2016-10-20 ENCOUNTER — Ambulatory Visit (INDEPENDENT_AMBULATORY_CARE_PROVIDER_SITE_OTHER): Payer: PPO | Admitting: Vascular Surgery

## 2016-10-20 VITALS — BP 132/69 | HR 53 | Temp 97.2°F | Resp 20 | Ht 69.0 in | Wt 219.0 lb

## 2016-10-20 DIAGNOSIS — I779 Disorder of arteries and arterioles, unspecified: Secondary | ICD-10-CM

## 2016-10-20 DIAGNOSIS — I739 Peripheral vascular disease, unspecified: Secondary | ICD-10-CM

## 2016-10-20 DIAGNOSIS — Z9862 Peripheral vascular angioplasty status: Secondary | ICD-10-CM | POA: Diagnosis not present

## 2016-10-20 NOTE — Progress Notes (Signed)
Patient name: Joel [name] MRN: [id number] DOB: [birth year] Sex: male  REASON FOR VISIT:    Follow up of left lower extremity bypass.  HPI:   Joel [name] is a pleasant 76 y.o. male who I last saw on 08/27/2015. He underwent a left below-knee popliteal to tibial artery bypass in 2011. At the time of his last visit his bypass graft was working well and he was set up for a 1 year follow up visit.  Since I saw him last, I do not get any clear-cut history of claudication or rest pain. He does experience some pain in his legs with walking but does have some chronic back pain issues also. He is not a smoker. He denies any history of nonhealing wounds.  Past Medical History:  Diagnosis Date  . Atypical chest pain   . CAD (coronary artery disease)    a. Multiple prior evaluations then 05/2012 s/p BMS to OM2. b. Nuc 11/2012 wnl. c. Cath 2015: patent stent, moderate LAD/RCA dz, treated medically.  . Cancer ([place])    Hx: of prostate  . Cataract   . CKD (chronic kidney disease), stage II   . COPD (chronic obstructive pulmonary disease) ([place])   . Cough secondary to angiotensin converting enzyme inhibitor (ACE-I)   . Diabetes mellitus without complication ([place])   . Diverticulosis   . DVT (deep venous thrombosis) ([place])   . GERD (gastroesophageal reflux disease)   . Headache(784.0)    Hx: of  . History of blood transfusion    "I've had 2; don't know what it was related to" (11/09/2012)  . HLD (hyperlipidemia)   . HTN (hypertension)   . Iron deficiency anemia   . Microcytic anemia   . Peripheral vascular disease ([place])    a. L pop-tibial bypass 2011, followed by [facility].  . Rotator cuff injury    "left arm; never repaired" (11/09/2012)    Family History  Problem Relation Age of Onset  . Breast cancer Mother        deceased  . Hyperlipidemia Mother   . Hypertension Mother   . COPD Father        deceased  . Cancer Father   . Hyperlipidemia Father   . Hypertension Father   .  Hypertension Sister   . Cancer Brother   . Hypertension Brother   . Hypertension Daughter   . Hypertension Son   . Colon cancer Neg Hx        pt is unclear about family hx    SOCIAL HISTORY: Social History  Substance Use Topics  . Smoking status: Former Smoker    Packs/day: 1.00    Years: 55.00    Types: Cigarettes    Quit date: 05/26/2011  . Smokeless tobacco: Never Used  . Alcohol use No    Allergies  Allergen Reactions  . Atenolol     bradycardia  . Imdur [Isosorbide Mononitrate] Nausea Only and Other (See Comments)    Headache  . Lisinopril Other (See Comments)    Cough  . Topiramate     Shortness of breath and leg cramps  . Tramadol-Acetaminophen Other (See Comments)    intolerance    Current Outpatient Prescriptions  Medication Sig Dispense Refill  . amLODipine (NORVASC) 10 MG tablet Take 1 tablet (10 mg total) by mouth daily. 30 tablet 5  . aspirin EC 81 MG tablet Take 81 mg by mouth daily.     . ferrous sulfate 325 (65 FE) MG tablet Take  325 mg by mouth daily with breakfast.    . gabapentin (NEURONTIN) 100 MG capsule TAKE 1 CAPSULE BY MOUTH AS NEEDED FOR HEADACHE PAIN. MAY REPEAT AFTER 2 HOURS IF NEEDED  0  . gabapentin (NEURONTIN) 600 MG tablet Take 600 mg by mouth at bedtime.    Marland Kitchen losartan-hydrochlorothiazide (HYZAAR) 100-25 MG tablet take 1 tablet by mouth once daily 90 tablet 2  . metFORMIN (GLUCOPHAGE) 500 MG tablet Take 500 mg by mouth 2 (two) times daily with a meal. Reported on 08/27/2015    . nitroGLYCERIN (NITROSTAT) 0.4 MG SL tablet Place 1 tablet (0.4 mg total) under the tongue every 5 (five) minutes as needed for chest pain. 25 tablet 2  . omeprazole (PRILOSEC) 20 MG capsule Take 20 mg by mouth 2 (two) times daily before a meal.    . pravastatin (PRAVACHOL) 40 MG tablet Take 40 mg by mouth every evening.    Marland Kitchen PROAIR HFA 108 (90 Base) MCG/ACT inhaler inhale 2 puffs every 4 hours 8.5 g 1  . tamsulosin (FLOMAX) 0.4 MG CAPS capsule Take 0.4 mg by mouth  daily after supper.     . traMADol (ULTRAM) 50 MG tablet Take 1 tablet (50 mg total) by mouth every 6 (six) hours as needed for moderate pain. 30 tablet 0   Current Facility-Administered Medications  Medication Dose Route Frequency Provider Last Rate Last Dose  . 0.9 %  sodium chloride infusion  500 mL Intravenous Continuous Gatha Mayer, MD        REVIEW OF SYSTEMS:  [X]  denotes positive finding, [ ]  denotes negative finding Cardiac  Comments:  Chest pain or chest pressure:    Shortness of breath upon exertion:    Short of breath when lying flat:    Irregular heart rhythm:        Vascular    Pain in calf, thigh, or hip brought on by ambulation:    Pain in feet at night that wakes you up from your sleep:     Blood clot in your veins:    Leg swelling:         Pulmonary    Oxygen at home:    Productive cough:     Wheezing:         Neurologic    Sudden weakness in arms or legs:     Sudden numbness in arms or legs:     Sudden onset of difficulty speaking or slurred speech:    Temporary loss of vision in one eye:     Problems with dizziness:         Gastrointestinal    Blood in stool:     Vomited blood:         Genitourinary    Burning when urinating:     Blood in urine:        Psychiatric    Major depression:         Hematologic    Bleeding problems:    Problems with blood clotting too easily:        Skin    Rashes or ulcers:        Constitutional    Fever or chills:     PHYSICAL EXAM:   Vitals:   10/20/16 1136  BP: 132/69  Pulse: (!) 53  Resp: 20  Temp: (!) 97.2 F (36.2 C)  TempSrc: Oral  SpO2: 96%  Weight: 219 lb (99.3 kg)  Height: 5\' 9"  (1.753 m)    GENERAL: The patient  is a well-nourished male, in no acute distress. The vital signs are documented above. CARDIAC: There is a regular rate and rhythm.  VASCULAR: I do not detect carotid bruits. On the left side, the side with his bypass graft, he has a palpable femoral pulse and palpable  posterior tibial pulse. On the right side, he has a palpable femoral pulse. I cannot palpate pedal pulses however both feet are warm and well-perfused. He has no significant lower extremity swelling. PULMONARY: There is good air exchange bilaterally without wheezing or rales. ABDOMEN: Soft and non-tender with normal pitched bowel sounds. I do not palpate an abdominal aortic aneurysm. MUSCULOSKELETAL: There are no major deformities or cyanosis. NEUROLOGIC: No focal weakness or paresthesias are detected. SKIN: There are no ulcers or rashes noted. PSYCHIATRIC: The patient has a normal affect.  DATA:    Arterial Doppler study: I have inability interpreted his arterial Doppler study.  On the right side there is a monophasic dorsalis pedis and posterior tibial signal with an ABI of 79%. Toe pressure is 60 mmHg.  On the left side there is a monophasic dorsalis pedis and posterior tibial signal. ABI on the left is 100% with a toe pressure of 90 mmHg.  GRAFT DUPLEX: I have independently interpreted his graft duplex scan today. He has biphasic Doppler signals throughout the graft with no areas of stenosis noted.  MEDICAL ISSUES:   STATUS POST LEFT POPLITEAL TO POSTERIOR TIBIAL ARTERY BYPASS: His bypass graft is functioning well and he has a normal ABI on the left. He is not a smoker. I have encouraged him to stay as active as possible. ABI on the right is 79% with minimal symptoms. I ordered a follow duplex scan and ABIs in one year now see him back at that time. He knows to call sooner if he has problems.  Deitra Mayo [facility] [phone]

## 2016-10-31 DIAGNOSIS — H2512 Age-related nuclear cataract, left eye: Secondary | ICD-10-CM | POA: Diagnosis not present

## 2016-11-02 ENCOUNTER — Other Ambulatory Visit: Payer: PPO

## 2016-11-02 ENCOUNTER — Ambulatory Visit: Payer: PPO | Admitting: Hematology

## 2016-11-04 DIAGNOSIS — H268 Other specified cataract: Secondary | ICD-10-CM | POA: Diagnosis not present

## 2016-11-04 DIAGNOSIS — H2512 Age-related nuclear cataract, left eye: Secondary | ICD-10-CM | POA: Diagnosis not present

## 2016-11-08 NOTE — Addendum Note (Signed)
Addended by: Lianne Cure A on: 11/08/2016 03:31 PM   Modules accepted: Orders

## 2016-11-11 ENCOUNTER — Other Ambulatory Visit: Payer: Self-pay

## 2016-11-12 ENCOUNTER — Other Ambulatory Visit (HOSPITAL_BASED_OUTPATIENT_CLINIC_OR_DEPARTMENT_OTHER): Payer: PPO

## 2016-11-12 ENCOUNTER — Other Ambulatory Visit: Payer: Self-pay

## 2016-11-12 ENCOUNTER — Ambulatory Visit (HOSPITAL_BASED_OUTPATIENT_CLINIC_OR_DEPARTMENT_OTHER): Payer: PPO | Admitting: Hematology

## 2016-11-12 VITALS — BP 123/58 | Temp 97.6°F | Resp 18 | Ht 69.0 in | Wt 220.3 lb

## 2016-11-12 DIAGNOSIS — D509 Iron deficiency anemia, unspecified: Secondary | ICD-10-CM | POA: Diagnosis not present

## 2016-11-12 DIAGNOSIS — N183 Chronic kidney disease, stage 3 (moderate): Secondary | ICD-10-CM

## 2016-11-12 DIAGNOSIS — D563 Thalassemia minor: Secondary | ICD-10-CM

## 2016-11-12 LAB — COMPREHENSIVE METABOLIC PANEL
ALT: 13 U/L (ref 0–55)
AST: 15 U/L (ref 5–34)
Albumin: 3.7 g/dL (ref 3.5–5.0)
Alkaline Phosphatase: 73 U/L (ref 40–150)
Anion Gap: 8 mEq/L (ref 3–11)
BUN: 14.3 mg/dL (ref 7.0–26.0)
CO2: 26 mEq/L (ref 22–29)
Calcium: 9.6 mg/dL (ref 8.4–10.4)
Chloride: 108 mEq/L (ref 98–109)
Creatinine: 1.3 mg/dL (ref 0.7–1.3)
EGFR: 60 mL/min/{1.73_m2} — ABNORMAL LOW (ref >90)
Glucose: 110 mg/dl (ref 70–140)
Potassium: 4.1 mEq/L (ref 3.5–5.1)
Sodium: 142 mEq/L (ref 136–145)
Total Bilirubin: 0.33 mg/dL (ref 0.20–1.20)
Total Protein: 7 g/dL (ref 6.4–8.3)

## 2016-11-12 LAB — CBC WITH DIFFERENTIAL/PLATELET
BASO%: 1.4 % (ref 0.0–2.0)
Basophils Absolute: 0.1 10*3/uL (ref 0.0–0.1)
EOS%: 7.5 % — ABNORMAL HIGH (ref 0.0–7.0)
Eosinophils Absolute: 0.3 10*3/uL (ref 0.0–0.5)
HCT: 35.7 % — ABNORMAL LOW (ref 38.4–49.9)
HGB: 11.3 g/dL — ABNORMAL LOW (ref 13.0–17.1)
LYMPH%: 19.7 % (ref 14.0–49.0)
MCH: 21.8 pg — ABNORMAL LOW (ref 27.2–33.4)
MCHC: 31.5 g/dL — ABNORMAL LOW (ref 32.0–36.0)
MCV: 69.2 fL — ABNORMAL LOW (ref 79.3–98.0)
MONO#: 0.4 10*3/uL (ref 0.1–0.9)
MONO%: 10.7 % (ref 0.0–14.0)
NEUT#: 2.4 10*3/uL (ref 1.5–6.5)
NEUT%: 60.7 % (ref 39.0–75.0)
Platelets: 153 10*3/uL (ref 140–400)
RBC: 5.17 10*6/uL (ref 4.20–5.82)
RDW: 17.6 % — ABNORMAL HIGH (ref 11.0–14.6)
WBC: 4 10*3/uL (ref 4.0–10.3)
lymph#: 0.8 10*3/uL — ABNORMAL LOW (ref 0.9–3.3)

## 2016-11-15 NOTE — Progress Notes (Signed)
Marland Kitchen    HEMATOLOGY/ONCOLOGY CLINIC NOTE  Date of Service: .11/12/2016  Patient Care Team: Girtha Rm, NP-C as PCP - General (Family Medicine) Burnell Blanks, MD as Consulting Physician (Cardiology) Clinic, [facility]  CHIEF COMPLAINTS/PURPOSE OF CONSULTATION:  Anemia  HISTORY OF PRESENTING ILLNESS:   Joel [name] is a wonderful 76 y.o. male who has been referred to [place] by Dr .Raenette Rover, Laurian Brim, NP-C  for evaluation and management of microcytic anemia.  Patient has a history of multiple medical comorbidities including hypertension, dyslipidemia, coronary artery disease status post PCI, chronic kidney disease stage III, COPD, DVT and previous history of prostate cancer.  He was recently seen by his primary care physician and had a CBC which showed a hemoglobin of 10.7 with an MCV of 68 WBC count of 3.5k with a platelet count of 162k. RBC's 5,040,000/mcL suggesting relative polycythemia. Patient on review of labs has had a history of mild chronic anemia for several years on and off. He is noted to have microcytic MCV even with hemoglobin more than 12 on previous occasions. This would suggest possible thalassemia trait especially in the setting of relative polycythemia. Patient had iron labs done on 09/10/2016 which showed a ferritin of 222 with an iron saturation of 19%. Patient reports he is going on ferrous sulfate 1 tablet by mouth daily for months. Patient reports that he follows up at the [facility] and salves very and is currently on Lupron for his prostate cancer as per his urologist.  No overt weight loss. No focal bone pains. No other acute new focal symptoms.  INTERVAL HISTORY  Patient is here for follow-up discussed the results of his lab workup. He notes no acute new concerns. His hemoglobin is improved at 11.3. We discussed that his labs show he likely has alpha thalassemia trait   MEDICAL HISTORY:  Past Medical History:  Diagnosis Date  . Atypical chest pain    . CAD (coronary artery disease)    a. Multiple prior evaluations then 05/2012 s/p BMS to OM2. b. Nuc 11/2012 wnl. c. Cath 2015: patent stent, moderate LAD/RCA dz, treated medically.  . Cancer ([place])    Hx: of prostate  . Cataract   . CKD (chronic kidney disease), stage II   . COPD (chronic obstructive pulmonary disease) ([place])   . Cough secondary to angiotensin converting enzyme inhibitor (ACE-I)   . Diabetes mellitus without complication ([place])   . Diverticulosis   . DVT (deep venous thrombosis) ([place])   . GERD (gastroesophageal reflux disease)   . Headache(784.0)    Hx: of  . History of blood transfusion    "I've had 2; don't know what it was related to" (11/09/2012)  . HLD (hyperlipidemia)   . HTN (hypertension)   . Iron deficiency anemia   . Microcytic anemia   . Peripheral vascular disease ([place])    a. L pop-tibial bypass 2011, followed by [facility].  . Rotator cuff injury    "left arm; never repaired" (11/09/2012)    SURGICAL HISTORY: Past Surgical History:  Procedure Laterality Date  . CARDIOVASCULAR STRESS TEST  Aug. 2014  . CATARACT EXTRACTION W/ INTRAOCULAR LENS IMPLANT Right ~ 2008  . CIRCUMCISION    . COLONOSCOPY    . CORONARY ANGIOPLASTY WITH STENT PLACEMENT  2014   "1" (11/09/2012)  . ILIAC ARTERY ANEURYSM REPAIR    . LEFT HEART CATH  05-31-12  . LEFT HEART CATHETERIZATION WITH CORONARY ANGIOGRAM N/A 06/01/2011   Procedure: LEFT HEART CATHETERIZATION WITH CORONARY  ANGIOGRAM;  Surgeon: Hillary Bow, MD;  Location: [facility] CATH LAB;  Service: Cardiovascular;  Laterality: N/A;  . LEFT HEART CATHETERIZATION WITH CORONARY ANGIOGRAM N/A 05/31/2012   Procedure: LEFT HEART CATHETERIZATION WITH CORONARY ANGIOGRAM;  Surgeon: Peter M Martinique, MD;  Location: [facility] CATH LAB;  Service: Cardiovascular;  Laterality: N/A;  . LEFT HEART CATHETERIZATION WITH CORONARY ANGIOGRAM N/A 08/01/2013   Procedure: LEFT HEART CATHETERIZATION WITH CORONARY ANGIOGRAM;  Surgeon: Burnell Blanks, MD;   Location: [facility] CATH LAB;  Service: Cardiovascular;  Laterality: N/A;  . PERCUTANEOUS CORONARY STENT INTERVENTION (PCI-S)  05/31/2012   Procedure: PERCUTANEOUS CORONARY STENT INTERVENTION (PCI-S);  Surgeon: Peter M Martinique, MD;  Location: [facility] CATH LAB;  Service: Cardiovascular;;  . PR VEIN BYPASS GRAFT,AORTO-FEM-POP Left 10/01/2009   left below knee popliteal artery to posterior tibial artery  . SHOULDER ARTHROSCOPY WITH OPEN ROTATOR CUFF REPAIR AND DISTAL CLAVICLE ACROMINECTOMY Left 01/12/2013   Procedure: LEFT SHOULDER ARTHROSCOPY WITH DEBRIDEMENT OPEN DISTAL CLAVICLE RESECTION ,acromioplastyAND ROTATOR CUFF REPAIR;  Surgeon: Yvette Rack., MD;  Location: [facility];  Service: Orthopedics;  Laterality: Left;    SOCIAL HISTORY: Social History   Social History  . Marital status: Widowed    Spouse name: N/A  . Number of children: N/A  . Years of education: N/A   Occupational History  . Retired    Social History Main Topics  . Smoking status: Former Smoker    Packs/day: 1.00    Years: 55.00    Types: Cigarettes    Quit date: 05/26/2011  . Smokeless tobacco: Never Used  . Alcohol use No  . Drug use: No  . Sexual activity: Not on file     Comment: 11/09/2012 "I don't have to answer questions about sex"   Other Topics Concern  . Not on file   Social History Narrative   Single. Retired. 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  Lives in [place] by himself.    FAMILY HISTORY: Family History  Problem Relation Age of Onset  . Breast cancer Mother        deceased  . Hyperlipidemia Mother   . Hypertension Mother   . COPD Father        deceased  . Cancer Father   . Hyperlipidemia Father   . Hypertension Father   . Hypertension Sister   . Cancer Brother   . Hypertension Brother   . Hypertension Daughter   . Hypertension Son   . Colon cancer Neg Hx        pt is unclear about family hx    ALLERGIES:  is allergic to atenolol; imdur [isosorbide mononitrate]; lisinopril; topiramate; and  tramadol-acetaminophen.  MEDICATIONS:  Current Outpatient Prescriptions  Medication Sig Dispense Refill  . amLODipine (NORVASC) 10 MG tablet Take 1 tablet (10 mg total) by mouth daily. 30 tablet 5  . aspirin EC 81 MG tablet Take 81 mg by mouth daily.     . ferrous sulfate 325 (65 FE) MG tablet Take 325 mg by mouth daily with breakfast.    . gabapentin (NEURONTIN) 100 MG capsule TAKE 1 CAPSULE BY MOUTH AS NEEDED FOR HEADACHE PAIN. MAY REPEAT AFTER 2 HOURS IF NEEDED  0  . gabapentin (NEURONTIN) 600 MG tablet Take 600 mg by mouth at bedtime.    Marland Kitchen losartan-hydrochlorothiazide (HYZAAR) 100-25 MG tablet take 1 tablet by mouth once daily 90 tablet 2  . metFORMIN (GLUCOPHAGE) 500 MG tablet Take 500 mg by mouth 2 (two) times daily with a meal. Reported on  08/27/2015    . nitroGLYCERIN (NITROSTAT) 0.4 MG SL tablet Place 1 tablet (0.4 mg total) under the tongue every 5 (five) minutes as needed for chest pain. 25 tablet 2  . omeprazole (PRILOSEC) 20 MG capsule Take 20 mg by mouth 2 (two) times daily before a meal.    . pravastatin (PRAVACHOL) 40 MG tablet Take 40 mg by mouth every evening.    Marland Kitchen PROAIR HFA 108 (90 Base) MCG/ACT inhaler inhale 2 puffs every 4 hours 8.5 g 1  . tamsulosin (FLOMAX) 0.4 MG CAPS capsule Take 0.4 mg by mouth daily after supper.     . traMADol (ULTRAM) 50 MG tablet Take 1 tablet (50 mg total) by mouth every 6 (six) hours as needed for moderate pain. 30 tablet 0   Current Facility-Administered Medications  Medication Dose Route Frequency Provider Last Rate Last Dose  . 0.9 %  sodium chloride infusion  500 mL Intravenous Continuous Gatha Mayer, MD        REVIEW OF SYSTEMS:    10 Point review of Systems was done is negative except as noted above.  PHYSICAL EXAMINATION: ECOG PERFORMANCE STATUS: 1 - Symptomatic but completely ambulatory  . Vitals:   11/12/16 1026  BP: (!) 123/58  Resp: 18  Temp: 97.6 F (36.4 C)   Filed Weights   11/12/16 1026  Weight: 220 lb 4.8  oz (99.9 kg)   .Body mass index is 32.53 kg/m.  GENERAL:alert, in no acute distress and comfortable SKIN: no acute rashes, no significant lesions EYES: conjunctiva are pink and non-injected, sclera anicteric OROPHARYNX: MMM, no exudates, no oropharyngeal erythema or ulceration NECK: supple, no JVD LYMPH:  no palpable lymphadenopathy in the cervical, axillary or inguinal regions LUNGS: clear to auscultation b/l with normal respiratory effort HEART: regular rate & rhythm ABDOMEN:  normoactive bowel sounds , non tender, not distended. Extremity: no pedal edema PSYCH: alert & oriented x 3 with fluent speech NEURO: no focal motor/sensory deficits  LABORATORY DATA:  I have reviewed the data as listed  . CBC Latest Ref Rng & Units 11/12/2016 09/30/2016 09/30/2016  WBC 4.0 - 10.3 10e3/uL 4.0 4.2 -  Hemoglobin 13.0 - 17.1 g/dL 11.3(L) 10.8(L) -  Hematocrit 38.4 - 49.9 % 35.7(L) 34.7(L) 33.5(L)  Platelets 140 - 400 10e3/uL 153 134(L) -   . CBC    Component Value Date/Time   WBC 4.0 11/12/2016 0912   WBC 3.5 (L) 09/10/2016 1311   RBC 5.17 11/12/2016 0912   RBC 5.04 09/10/2016 1311   HGB 11.3 (L) 11/12/2016 0912   HCT 35.7 (L) 11/12/2016 0912   PLT 153 11/12/2016 0912   MCV 69.2 (L) 11/12/2016 0912   MCH 21.8 (L) 11/12/2016 0912   MCH 21.2 (L) 09/10/2016 1311   MCHC 31.5 (L) 11/12/2016 0912   MCHC 30.8 (L) 09/10/2016 1311   RDW 17.6 (H) 11/12/2016 0912   LYMPHSABS 0.8 (L) 11/12/2016 0912   MONOABS 0.4 11/12/2016 0912   EOSABS 0.3 11/12/2016 0912   BASOSABS 0.1 11/12/2016 0912    CMP Latest Ref Rng & Units 11/12/2016 09/30/2016 09/10/2016  Glucose 70 - 140 mg/dl 110 85 85  BUN 7.0 - 26.0 mg/dL 14.3 17.8 14  Creatinine 0.7 - 1.3 mg/dL 1.3 1.5(H) 1.23(H)  Sodium 136 - 145 mEq/L 142 142 139  Potassium 3.5 - 5.1 mEq/L 4.1 4.3 4.4  Chloride 98 - 110 mmol/L - - 104  CO2 22 - 29 mEq/L 26 27 27   Calcium 8.4 - 10.4 mg/dL 9.6  9.7 9.4  Total Protein 6.4 - 8.3 g/dL 7.0 7.1 6.7  Total  Bilirubin 0.20 - 1.20 mg/dL 0.33 0.26 0.3  Alkaline Phos 40 - 150 U/L 73 68 63  AST 5 - 34 U/L 15 15 12   ALT 0 - 55 U/L 13 15 11    . Lab Results  Component Value Date   IRON 52 09/10/2016   TIBC 273 09/10/2016   IRONPCTSAT 19 09/10/2016   (Iron and TIBC)  Lab Results  Component Value Date   FERRITIN 222 09/10/2016       Component     Latest Ref Rng & Units 09/30/2016  Folate, Hemolysate     Not Estab. ng/mL 368.2  HCT     37.5 - 51.0 % 33.5 (L)  Folate, RBC     >498 ng/mL 1,099  LDH     125 - 245 U/L 225  Haptoglobin     34 - 200 mg/dL 174  Sed Rate     0 - 30 mm/hr 25   RADIOGRAPHIC STUDIES: I have personally reviewed the radiological images as listed and agreed with the findings in the report. No results found.  ASSESSMENT & PLAN:   76 year old male with  #1 Mild Microcytic Anemia with relative polycythemia . Based on iron profile and ferritin level of 222 and iron saturation of 19% the patient does not appear to be overtly iron deficient at this time . Hemoglobin electrophoresis was consistent with alpha thalassemia trait. LDH and haptoglobin suggest against overt hemolysis. RBC folate levels within normal limits. Patient notes no overt bleeding . He is on Lupron for his prostate cancer as per his urologist . This could contribution to to a 1-2 g drop in hemoglobin as well. Plan - Po iron for maintenance as per PCP to maintain ferritin >100 and Iron sat >20% -I discussed the lab workup findings in detail with the patient and answered all his questions to his apparent satisfaction. -We discussed that his RBC microcytosis is out of proportion to his iron levels and that is no evidence of chronic inflammation based on normal sedimentation rate. His hemoglobin electrophoresis shows reduced hemoglobin A2 levels which in the absence of overt iron deficiency suggests alpha thalassemia trait. -We discussed that this should not affect his hemoglobin levels to much do  his red cells will likely all does be smaller. -He is unlikely to have overt hemolysis though it might be reasonable to take a daily vitamin B complex to support slightly reduced RBC lifespan's. -Continue follow-up with primary care physician. -No additional hematologic workup recommended at this time. -Continue follow-up with urology for continued monitoring and management of prostate cancer. . No orders of the defined types were placed in this encounter.   RTC with Dr Irene Limbo if any new questions or concerns arise  All of the patients questions were answered with apparent satisfaction. The patient knows to call the clinic with any problems, questions or concerns.  I spent 20 minutes counseling the patient face to face. The total time spent in the appointment was 25 minutes and more than 50% was on counseling and direct patient cares.    Joel Lone MD [place] AAHIVMS [facility] Hematology/Oncology Physician [facility]  (Office):       [phone] (Work cell):  [phone] (Fax):           [phone]  11/15/2016 10:52 AM

## 2016-11-24 ENCOUNTER — Encounter: Payer: Self-pay | Admitting: Family Medicine

## 2016-11-24 ENCOUNTER — Ambulatory Visit (INDEPENDENT_AMBULATORY_CARE_PROVIDER_SITE_OTHER): Payer: PPO | Admitting: Family Medicine

## 2016-11-24 VITALS — BP 128/80 | HR 80 | Temp 97.9°F | Wt 224.4 lb

## 2016-11-24 DIAGNOSIS — R600 Localized edema: Secondary | ICD-10-CM

## 2016-11-24 NOTE — Patient Instructions (Signed)
Stop the cyclobenzaprine. Start being more active, elevate your legs when you are sitting down.  Cut back on salt.  You can try compression stockings if the above recommendations are not helping.   If you get worse call or return.

## 2016-11-24 NOTE — Progress Notes (Signed)
   Subjective:    Patient ID: JOHNEL YIELDING, male    DOB: 09-14-1940, 76 y.o.   MRN: [id number]  HPI Chief Complaint  Patient presents with  . foot swelling    foot swelling. noticied it today.    Complains of bilateral foot swelling since 11 am today.   States he went to the [facility] on Monday with right hip pain and he was prescribed Voltaren gel and cyclobenzaprine.  States the medication has made him very sleepy and has not been as active and has been sitting around.  States he has been eating more salt than usual.   Denies fever, chills, dizziness, chest pain, palpitations, orthopnea, cough, abdominal pain, N/V/D.    Reviewed allergies, medications, past medical, surgical,  and social history.  Review of Systems Pertinent positives and negatives in the history of present illness.     Objective:   Physical Exam  Constitutional: He is oriented to person, place, and time. He appears well-developed and well-nourished. No distress.  HENT:  Mouth/Throat: Oropharynx is clear and moist.  Cardiovascular: Normal rate, regular rhythm, normal heart sounds and intact distal pulses.   Pulmonary/Chest: Effort normal and breath sounds normal.  Musculoskeletal:       Right foot: There is swelling. There is normal range of motion, no tenderness and normal capillary refill.       Left foot: There is swelling. There is normal range of motion, no tenderness and normal capillary refill.  Normal color, warmth and sensation. Pulses palpable.   Neurological: He is alert and oriented to person, place, and time.  Skin: Skin is warm and dry.   BP 128/80   Pulse 80   Temp 97.9 F (36.6 C) (Oral)   Wt 224 lb 6.4 oz (101.8 kg)   BMI 33.14 kg/m      Assessment & Plan:  Pedal edema  Discussed that his edema is mild, no cardiopulmonary symptoms.  Discussed cutting back on salt, stopping the muscle relaxant and increase activity level. He will elevate legs when sitting and may try compression  stockings.  Follow up if this worsens.

## 2016-11-30 ENCOUNTER — Telehealth: Payer: Self-pay | Admitting: Family Medicine

## 2016-11-30 NOTE — Telephone Encounter (Signed)
[facility] pt can be reached at [phone]

## 2016-12-03 ENCOUNTER — Encounter: Payer: Self-pay | Admitting: Family Medicine

## 2016-12-23 DIAGNOSIS — D2311 Other benign neoplasm of skin of right eyelid, including canthus: Secondary | ICD-10-CM | POA: Diagnosis not present

## 2017-01-28 ENCOUNTER — Telehealth: Payer: Self-pay | Admitting: Family Medicine

## 2017-01-28 NOTE — Telephone Encounter (Signed)
Called into [facility]

## 2017-01-28 NOTE — Telephone Encounter (Signed)
Call out #[phone]

## 2017-01-28 NOTE — Telephone Encounter (Signed)
Pt called requesting a a refill on his tramadol pt uses [id number] [place], [place] informed pt that vickie was not in the office today please call and let him know if this is called in pt can be reached at [phone]

## 2017-02-08 ENCOUNTER — Ambulatory Visit (INDEPENDENT_AMBULATORY_CARE_PROVIDER_SITE_OTHER): Payer: PPO | Admitting: Family Medicine

## 2017-02-08 ENCOUNTER — Encounter: Payer: Self-pay | Admitting: Family Medicine

## 2017-02-08 VITALS — BP 140/80 | HR 71 | Temp 97.9°F | Wt 236.2 lb

## 2017-02-08 DIAGNOSIS — R05 Cough: Secondary | ICD-10-CM

## 2017-02-08 DIAGNOSIS — Z23 Encounter for immunization: Secondary | ICD-10-CM

## 2017-02-08 DIAGNOSIS — R059 Cough, unspecified: Secondary | ICD-10-CM

## 2017-02-08 DIAGNOSIS — R5383 Other fatigue: Secondary | ICD-10-CM

## 2017-02-08 NOTE — Patient Instructions (Addendum)
Dr. Angelena Form office at [facility] [place] [phone]

## 2017-02-08 NOTE — Progress Notes (Signed)
   Subjective:    [name], male    DOB: Dec 16, 1940, 76 y.o.   MRN: [id number]  HPI Chief Complaint  Patient presents with  . multiple issues    sunday feeling tired, woozy, dizziness, alittle cough thought he was catching a cold. but today he is feeling fine. taking injections for prostate cancer so not sure if these symptoms are causing it as well cause they started after treatment   States 2 days ago he was having issues feeling hot and cold, feeling extreme fatigue and sleepiness. States he feels a lot better today. No other concerns today.  He would like to get a flu shot.   Started once monthly injections last month for prostate cancer. He is being treated at the [facility] in [place] by Dr. Joylene Igo.   Denies fever,  dizziness, chest pain, palpitations, shortness of breath, abdominal pain, N/V/D, urinary symptoms, LE edema.   States he thinks he is supposed to follow up with his cardiologist but does not have an appointment.   Reviewed allergies, medications, past medical, surgical, and social history.    Review of Systems Pertinent positives and negatives in the history of present illness.     Objective:   Physical Exam BP 140/80   Pulse 71   Temp 97.9 F (36.6 C) (Oral)   Wt 236 lb 3.2 oz (107.1 kg)   BMI 34.88 kg/m   Alert and in no distress.  Pharyngeal area is normal. Neck is supple without adenopathy or thyromegaly. Cardiac exam shows a regular sinus rhythm without murmurs or gallops. Lungs are clear to auscultation. Abdomen soft, non distended, non tender.  Extremities without edema.      Assessment & Plan:  Fatigue, unspecified type  Cough  Needs flu shot - Plan: Flu vaccine HIGH DOSE PF  States his symptoms have resolved except for an occasional mild dry cough. He is feeling more energetic today. Starting another new medication for prostate cancer called Zytiga. He is being followed closely by Dr. Carolyne Fiscal at the [facility].  He will  call to schedule with his cardiologist as he thinks he is overdue for a follow up.  Flu shot given.  He knows he can call or return if he has any worsening or new symptoms and I advised him to also report symptoms to his oncologist in case symptoms are related to treatment.

## 2017-02-25 ENCOUNTER — Ambulatory Visit: Payer: PPO | Admitting: Family Medicine

## 2017-02-25 ENCOUNTER — Encounter: Payer: Self-pay | Admitting: Family Medicine

## 2017-02-25 VITALS — BP 120/68 | HR 71 | Resp 16 | Wt 239.2 lb

## 2017-02-25 DIAGNOSIS — R42 Dizziness and giddiness: Secondary | ICD-10-CM

## 2017-02-25 DIAGNOSIS — R0981 Nasal congestion: Secondary | ICD-10-CM | POA: Diagnosis not present

## 2017-02-25 DIAGNOSIS — K219 Gastro-esophageal reflux disease without esophagitis: Secondary | ICD-10-CM

## 2017-02-25 NOTE — Progress Notes (Signed)
   Subjective:    [name], male    DOB: Feb 18, 1941, 76 y.o.   MRN: [id number]  HPI Chief Complaint  Patient presents with  . dizzy, sob    since yesterday morning   Complains of intermittent episodes of dizziness since yesterday. Feels unsteady if he bends over or gets up fast. Dizziness is mild per patient and lasts 5 minutes or so.  Also reports intermittent shortness of breath, mainly with bending over to tie his shoes. States he has nasal congestion but feels fine otherwise. Continues having hot flashes due to treatment for prostate cancer.    No fever, chills, cough, chest pain, palpitations, LE edema.  Denies numbness, tingling, weakness.  He no double or blurry vision, no tinnitus.  Also reports persistent GERD and takes daily PPI for this.  Reviewed allergies, medications, past medical, surgical, and social history.   Review of Systems Pertinent positives and negatives in the history of present illness.     Objective:   Physical Exam BP 120/68 (BP Location: Right Arm, Patient Position: Standing, Cuff Size: Large)   Pulse 71   Resp 16   Wt 239 lb 3.2 oz (108.5 kg)   SpO2 95%   BMI 35.32 kg/m  Alert and in no distress. No sinus tenderness. Nares patent with erythema, edema, no discharge.  Tympanic membranes and canals are normal. Pharyngeal area is normal. Neck is supple without adenopathy or thyromegaly. Cardiac exam shows a regular rhythm without murmurs or gallops. Lungs are clear to auscultation. Abdomen soft, non distended, non tender, normal BS. Extremities without edema.  PERRLA, EOMs intact, CN II-IX intact, no facial asymmetry, normal DTRs, negative Romberg. Skin warm and dry, no pallor or rash.       Assessment & Plan:  Dizziness  Gastroesophageal reflux disease, esophagitis presence not specified  Nasal congestion  He is not postural.  Neuro exam is normal.  Discussed that his symptoms are intermittent and vague without any obvious  etiology. Reassured him that his symptoms do not appear to be serious but recommend if his symptoms return, worsen or if he has any new symptoms that he should be seen again and if over the weekend he will go to the ED. he will continue on omeprazole for GERD and avoid food triggers. We will do watchful waiting and have him call Monday to let me know how he is doing.

## 2017-03-01 ENCOUNTER — Ambulatory Visit: Payer: PPO | Admitting: Cardiology

## 2017-03-01 ENCOUNTER — Encounter: Payer: Self-pay | Admitting: Cardiology

## 2017-03-01 DIAGNOSIS — R079 Chest pain, unspecified: Secondary | ICD-10-CM | POA: Diagnosis not present

## 2017-03-01 NOTE — Patient Instructions (Signed)
Medication Instructions:   Your physician recommends that you continue on your current medications as directed. Please refer to the Current Medication list given to you today.   If you need a refill on your cardiac medications before your next appointment, please call your pharmacy.  Labwork: NONE ORDERED  TODAY    Testing/Procedures: Your physician has requested that you have a lexiscan myoview. For further information please visit [email or web address]. Please follow instruction sheet, as given.   Follow-Up:  Your physician wants you to follow-up in:  IN 6  MONTHS WITH DR MCALHANY You will receive a reminder letter in the mail two months in advance. If you don't receive a letter, please call our office to schedule the follow-up appointment.      Any Other Special Instructions Will Be Listed Below (If Applicable).                                                                                                                                                   

## 2017-03-01 NOTE — Progress Notes (Signed)
03/01/2017 Joel [name]   [birth year]  [id number]  Primary Physician Girtha Rm, NP-C Primary Cardiologist: Dr. Angelena Form    Reason for Visit/CC: F/u for CAD  HPI:  Joel [name] is a 76 y.o. male  [place], saw active compact in [place] x2 in 1968 and again in 1969. Followed at the [facility]. Notes some agent orange exposure. He has known CAD, HTN, HLD and DM. In 2014, he had a cath and was found to have severe Lcx disease and underwent PCI + stenting. Also with moderate disease in the LAD and RCA, treated medically. He underwent repeat cath in 2015 that showed patient LCx stent and stable moderate LAD and RCA disease. He also has PVD, s/p left below the knee popleteal tibial artery bypass by Dr. Scot Dock. He follows him yearly. Dr. Angelena Form is his primary cardiologist.   He is back to clinic today for 6 month f/u. He notes recent left sided chest pain last week. Occurred off and on. Felt like an ache. Eventually went away. Felt somewhat similar to previous angina in 2014 but not as severe. Also somewhat atypical in that symptoms were not necessarily worse with exertion. He had no other associated symptoms. He denies any recurrent pain this week. He is CP free currently. VSS. Labs from [place] were faxed to our office. LDL 76 mg/dL. DM is well controlled. Hgb A1c is 6.5.   Pt also notes recent diagnosis of prostate CA, being treated by the [place]. He is getting injections. No radiation or surgery at this point.   Current Meds  Medication Sig  . abiraterone Acetate (ZYTIGA) 250 MG tablet Take 1,000 mg by mouth daily. Take on an empty stomach 1 hour before or 2 hours after a meal  . albuterol (PROVENTIL HFA;VENTOLIN HFA) 108 (90 Base) MCG/ACT inhaler Inhale 2 puffs every 4 (four) hours as needed into the lungs for wheezing or shortness of breath.  Marland Kitchen amLODipine (NORVASC) 10 MG tablet Take 1 tablet (10 mg total) by mouth daily.  Marland Kitchen aspirin EC 81 MG tablet Take 81 mg by mouth daily.   .  ferrous sulfate 325 (65 FE) MG tablet Take 325 mg by mouth daily with breakfast.  . gabapentin (NEURONTIN) 100 MG capsule TAKE 1 CAPSULE BY MOUTH AS NEEDED FOR HEADACHE PAIN. MAY REPEAT AFTER 2 HOURS IF NEEDED  . gabapentin (NEURONTIN) 600 MG tablet Take 600 mg by mouth at bedtime.  Marland Kitchen losartan-hydrochlorothiazide (HYZAAR) 100-25 MG tablet take 1 tablet by mouth once daily  . metFORMIN (GLUCOPHAGE) 500 MG tablet Take 500 mg by mouth 2 (two) times daily with a meal. Reported on 08/27/2015  . nitroGLYCERIN (NITROSTAT) 0.4 MG SL tablet Place 1 tablet (0.4 mg total) under the tongue every 5 (five) minutes as needed for chest pain.  Marland Kitchen omeprazole (PRILOSEC) 20 MG capsule Take 20 mg by mouth 2 (two) times daily before a meal.  . predniSONE (DELTASONE) 5 MG tablet Take 5 mg daily by mouth.  . tamsulosin (FLOMAX) 0.4 MG CAPS capsule Take 0.4 mg at bedtime by mouth.  . traMADol (ULTRAM) 50 MG tablet Take 1 tablet (50 mg total) by mouth every 6 (six) hours as needed for moderate pain.   Current Facility-Administered Medications for the 03/01/17 encounter (Office Visit) with Consuelo Pandy, PA-C  Medication  . 0.9 %  sodium chloride infusion   Allergies  Allergen Reactions  . Atenolol     bradycardia  . Imdur [Isosorbide Mononitrate] Nausea Only and Other (See  Comments)    Headache  . Lisinopril Other (See Comments)    Cough  . Topiramate     Shortness of breath and leg cramps  . Tramadol-Acetaminophen Other (See Comments)    intolerance   Past Medical History:  Diagnosis Date  . Atypical chest pain   . CAD (coronary artery disease)    a. Multiple prior evaluations then 05/2012 s/p BMS to OM2. b. Nuc 11/2012 wnl. c. Cath 2015: patent stent, moderate LAD/RCA dz, treated medically.  . Cancer ([place])    Hx: of prostate  . Cataract   . CKD (chronic kidney disease), stage II   . COPD (chronic obstructive pulmonary disease) ([place])   . Cough secondary to angiotensin converting enzyme inhibitor  (ACE-I)   . Diabetes mellitus without complication ([place])   . Diverticulosis   . DVT (deep venous thrombosis) ([place])   . GERD (gastroesophageal reflux disease)   . Headache(784.0)    Hx: of  . History of blood transfusion    "I've had 2; don't know what it was related to" (11/09/2012)  . HLD (hyperlipidemia)   . HTN (hypertension)   . Iron deficiency anemia   . Microcytic anemia   . Peripheral vascular disease ([place])    a. L pop-tibial bypass 2011, followed by [facility].  . Rotator cuff injury    "left arm; never repaired" (11/09/2012)   Family History  Problem Relation Age of Onset  . Breast cancer Mother        deceased  . Hyperlipidemia Mother   . Hypertension Mother   . COPD Father        deceased  . Cancer Father   . Hyperlipidemia Father   . Hypertension Father   . Hypertension Sister   . Cancer Brother   . Hypertension Brother   . Hypertension Daughter   . Hypertension Son   . Colon cancer Neg Hx        pt is unclear about family hx   Past Surgical History:  Procedure Laterality Date  . CARDIOVASCULAR STRESS TEST  Aug. 2014  . CATARACT EXTRACTION W/ INTRAOCULAR LENS IMPLANT Right ~ 2008  . CIRCUMCISION    . COLONOSCOPY    . CORONARY ANGIOPLASTY WITH STENT PLACEMENT  2014   "1" (11/09/2012)  . ILIAC ARTERY ANEURYSM REPAIR    . LEFT HEART CATH  05-31-12  . LEFT HEART CATHETERIZATION WITH CORONARY ANGIOGRAM N/A 08/01/2013   Performed by Burnell Blanks, MD at [facility] CATH LAB  . LEFT HEART CATHETERIZATION WITH CORONARY ANGIOGRAM N/A 05/31/2012   Performed by Martinique, Peter M, MD at [facility] CATH LAB  . LEFT HEART CATHETERIZATION WITH CORONARY ANGIOGRAM N/A 06/01/2011   Performed by Hillary Bow, MD at [facility] CATH LAB  . LEFT SHOULDER ARTHROSCOPY WITH DEBRIDEMENT OPEN DISTAL CLAVICLE RESECTION ,acromioplastyAND ROTATOR CUFF REPAIR Left 01/12/2013   Performed by Yvette Rack., MD at [facility] OR  . PERCUTANEOUS CORONARY STENT INTERVENTION (PCI-S)  05/31/2012   Performed by Martinique,  Peter M, MD at [facility] CATH LAB  . PR VEIN BYPASS GRAFT,AORTO-FEM-POP Left 10/01/2009   left below knee popliteal artery to posterior tibial artery   Social History   Socioeconomic History  . Marital status: Widowed    Spouse name: Not on file  . Number of children: Not on file  . Years of education: Not on file  . Highest education level: Not on file  Social Needs  . Financial resource strain: Not on file  . Food  insecurity - worry: Not on file  . Food insecurity - inability: Not on file  . Transportation needs - medical: Not on file  . Transportation needs - non-medical: Not on file  Occupational History  . Occupation: Retired  Tobacco Use  . Smoking status: Former Smoker    Packs/day: 1.00    Years: 55.00    Pack years: 55.00    Types: Cigarettes    Last attempt to quit: 05/26/2011    Years since quitting: 5.7  . Smokeless tobacco: Never Used  Substance and Sexual Activity  . Alcohol use: No  . Drug use: No  . Sexual activity: Not on file    Comment: 11/09/2012 "I don't have to answer questions about sex"  Other Topics Concern  . 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  Lives in [place] by himself.     Review of Systems: General: negative for chills, fever, night sweats or weight changes.  Cardiovascular: negative for chest pain, dyspnea on exertion, edema, orthopnea, palpitations, paroxysmal nocturnal dyspnea or shortness of breath Dermatological: negative for rash Respiratory: negative for cough or wheezing Urologic: negative for hematuria Abdominal: negative for nausea, vomiting, diarrhea, bright red blood per rectum, melena, or hematemesis Neurologic: negative for visual changes, syncope, or dizziness All other systems reviewed and are otherwise negative except as noted above.   Physical Exam:  Blood pressure (!) 142/74, pulse 63, height 5\' 11"  (1.803 m), weight 235 lb (106.6 kg), SpO2 98 %.  General appearance: alert,  cooperative and no distress Neck: no carotid bruit and no JVD Lungs: clear to auscultation bilaterally Heart: regular rate and rhythm, S1, S2 normal, no murmur, click, rub or gallop Extremities: extremities normal, atraumatic, no cyanosis or edema Pulses: 2+ and symmetric Skin: Skin color, texture, turgor normal. No rashes or lesions Neurologic: Grossly normal  EKG not performed  -- personally reviewed   ASSESSMENT AND PLAN:   1. Chest Pain: mixed typical and atypical features. He has known CAD with last cath in 2015 showing moderate LAD and RCA disease and patent LCx stent. Currently CP free. He has other cardiac risk factors including DM, HTN and HLD. Will arrange lexiscan NST to r/o ischemia.   2. CAD: per above.   3. [facility] Recent lipid panel showed LDL at 76 mb/dL. He is on statin therapy.   4. DM: well controlled. Recent Hgb A1c was 6.5.  5. HTN: controlled on current regimen.   6. Prostate CA: undergoing treatment at the [place], injections.   7. PVD: h/o left below the knee popliteal tibial artery bypass by Dr. Scot Dock in 2011. Stable w/o claudication. Sees Dr. Scot Dock yearly.     Follow-Up: if stress test is abnormal, we will arrange f/u to discuss cath. However if low risk, he can f/u in 6 months w/ Dr. Angelena Form.   Joel [name], MHS [facility] 03/01/2017 9:11 AM

## 2017-03-03 ENCOUNTER — Emergency Department (HOSPITAL_COMMUNITY)
Admission: EM | Admit: 2017-03-03 | Discharge: 2017-03-03 | Disposition: A | Discharge status: Home / Self Care | Payer: PPO | Attending: Emergency Medicine | Admitting: Emergency Medicine

## 2017-03-03 ENCOUNTER — Encounter (HOSPITAL_COMMUNITY): Payer: Self-pay | Admitting: Emergency Medicine

## 2017-03-03 ENCOUNTER — Other Ambulatory Visit: Payer: Self-pay

## 2017-03-03 ENCOUNTER — Emergency Department (HOSPITAL_COMMUNITY): Payer: PPO

## 2017-03-03 DIAGNOSIS — I2510 Atherosclerotic heart disease of native coronary artery without angina pectoris: Secondary | ICD-10-CM | POA: Insufficient documentation

## 2017-03-03 DIAGNOSIS — Z7982 Long term (current) use of aspirin: Secondary | ICD-10-CM | POA: Diagnosis not present

## 2017-03-03 DIAGNOSIS — R0602 Shortness of breath: Secondary | ICD-10-CM | POA: Insufficient documentation

## 2017-03-03 DIAGNOSIS — Z8546 Personal history of malignant neoplasm of prostate: Secondary | ICD-10-CM | POA: Diagnosis not present

## 2017-03-03 DIAGNOSIS — Z7984 Long term (current) use of oral hypoglycemic drugs: Secondary | ICD-10-CM | POA: Insufficient documentation

## 2017-03-03 DIAGNOSIS — E1122 Type 2 diabetes mellitus with diabetic chronic kidney disease: Secondary | ICD-10-CM | POA: Diagnosis not present

## 2017-03-03 DIAGNOSIS — I129 Hypertensive chronic kidney disease with stage 1 through stage 4 chronic kidney disease, or unspecified chronic kidney disease: Secondary | ICD-10-CM | POA: Diagnosis not present

## 2017-03-03 DIAGNOSIS — J449 Chronic obstructive pulmonary disease, unspecified: Secondary | ICD-10-CM | POA: Diagnosis not present

## 2017-03-03 DIAGNOSIS — Z8673 Personal history of transient ischemic attack (TIA), and cerebral infarction without residual deficits: Secondary | ICD-10-CM | POA: Diagnosis not present

## 2017-03-03 DIAGNOSIS — Z87891 Personal history of nicotine dependence: Secondary | ICD-10-CM | POA: Insufficient documentation

## 2017-03-03 DIAGNOSIS — N182 Chronic kidney disease, stage 2 (mild): Secondary | ICD-10-CM | POA: Insufficient documentation

## 2017-03-03 DIAGNOSIS — Z79899 Other long term (current) drug therapy: Secondary | ICD-10-CM | POA: Diagnosis not present

## 2017-03-03 LAB — BASIC METABOLIC PANEL
Anion gap: 7 (ref 5–15)
BUN: 16 mg/dL (ref 6–20)
CO2: 29 mmol/L (ref 22–32)
Calcium: 9.4 mg/dL (ref 8.9–10.3)
Chloride: 103 mmol/L (ref 101–111)
Creatinine, Ser: 1.3 mg/dL — ABNORMAL HIGH (ref 0.61–1.24)
GFR calc Af Amer: 59 mL/min — ABNORMAL LOW (ref >60)
GFR calc non Af Amer: 51 mL/min — ABNORMAL LOW (ref >60)
Glucose, Bld: 87 mg/dL (ref 65–99)
Potassium: 3.8 mmol/L (ref 3.5–5.1)
Sodium: 139 mmol/L (ref 135–145)

## 2017-03-03 LAB — CBC
HCT: 35.7 % — ABNORMAL LOW (ref 39.0–52.0)
Hemoglobin: 11 g/dL — ABNORMAL LOW (ref 13.0–17.0)
MCH: 21.7 pg — ABNORMAL LOW (ref 26.0–34.0)
MCHC: 30.8 g/dL (ref 30.0–36.0)
MCV: 70.4 fL — ABNORMAL LOW (ref 78.0–100.0)
Platelets: 134 10*3/uL — ABNORMAL LOW (ref 150–400)
RBC: 5.07 MIL/uL (ref 4.22–5.81)
RDW: 16.9 % — ABNORMAL HIGH (ref 11.5–15.5)
WBC: 5.3 10*3/uL (ref 4.0–10.5)

## 2017-03-03 LAB — I-STAT TROPONIN, ED: Troponin i, poc: 0 ng/mL (ref 0.00–0.08)

## 2017-03-03 MED ORDER — IPRATROPIUM-ALBUTEROL 0.5-2.5 (3) MG/3ML IN SOLN
3.0000 mL | Freq: Once | RESPIRATORY_TRACT | Status: AC
  Administered 2017-03-03: 3 mL via RESPIRATORY_TRACT
  Filled 2017-03-03: qty 3

## 2017-03-03 NOTE — Discharge Instructions (Signed)
Return to the emergency department if you develop chest pain, worsening breathing, high fevers, or other new and concerning symptoms.

## 2017-03-03 NOTE — ED Notes (Signed)
Pt requesting another breathing tx before being dc'd. Will speak with md. Pt lungs clear bilat. O2 sats 98% RA. No sob. VSS. Md aware.

## 2017-03-03 NOTE — ED Notes (Signed)
Breathing tx complete. States that he thinks it may have helped a little. Will continue to monitor.

## 2017-03-03 NOTE — ED Triage Notes (Signed)
Patient reports SOB onset this morning , denies cough , no chest pain , he took 1 NTG and Tramadol for his LUQ pain yesterday with relief.

## 2017-03-03 NOTE — ED Provider Notes (Signed)
[place] EMERGENCY DEPARTMENT Provider Note   CSN: [id number] Arrival date & time: 03/03/17  0155     History   Chief Complaint Chief Complaint  Patient presents with  . Shortness of Breath    HPI Joel [name] is a 76 y.o. male.  Patient is a 76 year old male with history of COPD, chronic renal insufficiency, hypertension presenting for evaluation of shortness of breath.  This started approximately 1 AM.  He denies any chest pain, fevers, chills, or productive cough.  He used his inhaler prior to coming here and is actually feeling better.   The history is provided by the patient.  Shortness of Breath  This is a new problem. The average episode lasts 2 hours. The problem occurs continuously.The current episode started 1 to 2 hours ago. The problem has been rapidly improving. Pertinent negatives include no fever, no cough, no sputum production, no orthopnea, no leg pain and no leg swelling.    Past Medical History:  Diagnosis Date  . Atypical chest pain   . CAD (coronary artery disease)    a. Multiple prior evaluations then 05/2012 s/p BMS to OM2. b. Nuc 11/2012 wnl. c. Cath 2015: patent stent, moderate LAD/RCA dz, treated medically.  . Cancer ([place])    Hx: of prostate  . Cataract   . CKD (chronic kidney disease), stage II   . COPD (chronic obstructive pulmonary disease) ([place])   . Cough secondary to angiotensin converting enzyme inhibitor (ACE-I)   . Diabetes mellitus without complication ([place])   . Diverticulosis   . DVT (deep venous thrombosis) ([place])   . GERD (gastroesophageal reflux disease)   . Headache(784.0)    Hx: of  . History of blood transfusion    "I've had 2; don't know what it was related to" (11/09/2012)  . HLD (hyperlipidemia)   . HTN (hypertension)   . Iron deficiency anemia   . Microcytic anemia   . Peripheral vascular disease ([place])    a. L pop-tibial bypass 2011, followed by [facility].  . Rotator cuff injury    "left arm; never  repaired" (11/09/2012)    Patient Active Problem List   Diagnosis Date Noted  . Elliptocytosis on peripheral blood smear ([place]) 09/13/2016  . Prediabetes 03/22/2016  . PVD (peripheral vascular disease) with claudication ([place]) 11/15/2012  . Chest pain, localized 11/10/2012  . Hypokalemia 11/10/2012  . Bradycardia 11/09/2012  . Thrombocytopenia ([place]) 06/01/2012  . Microcytic anemia   . Atherosclerosis of native arteries of the extremities with intermittent claudication 06/16/2011  . Stable angina ([place]) 06/11/2011  . CAD (coronary artery disease)   . Chest pain, atypical 05/30/2011  . Iron deficiency anemia 10/03/2009  . DIVERTICULOSIS OF COLON 10/03/2009  . WEIGHT LOSS, ABNORMAL 10/03/2009  . CHEST PAIN UNSPECIFIED 09/13/2008  . Headache(784.0) 08/19/2008  . TIA 06/05/2008  . SHOULDER PAIN, LEFT 10/11/2007  . COUGH DUE TO ACE INHIBITORS 05/07/2007  . History of tobacco use disorder 04/25/2007  . HYPERLIPIDEMIA 04/21/2007  . Essential hypertension 04/21/2007  . GERD 04/21/2007    Past Surgical History:  Procedure Laterality Date  . CARDIOVASCULAR STRESS TEST  Aug. 2014  . CATARACT EXTRACTION W/ INTRAOCULAR LENS IMPLANT Right ~ 2008  . CIRCUMCISION    . COLONOSCOPY    . CORONARY ANGIOPLASTY WITH STENT PLACEMENT  2014   "1" (11/09/2012)  . ILIAC ARTERY ANEURYSM REPAIR    . LEFT HEART CATH  05-31-12  . LEFT HEART CATHETERIZATION WITH CORONARY ANGIOGRAM N/A 06/01/2011  Procedure: LEFT HEART CATHETERIZATION WITH CORONARY ANGIOGRAM;  Surgeon: Hillary Bow, MD;  Location[facility] CATH LAB;  Service: Cardiovascular;  Laterality: N/A;  . LEFT HEART CATHETERIZATION WITH CORONARY ANGIOGRAM N/A 05/31/2012   Procedure: LEFT HEART CATHETERIZATION WITH CORONARY ANGIOGRAM;  Surgeon: Peter M Martinique, MD;  Location: [facility] CATH LAB;  Service: Cardiovascular;  Laterality: N/A;  . LEFT HEART CATHETERIZATION WITH CORONARY ANGIOGRAM N/A 08/01/2013   Procedure: LEFT HEART CATHETERIZATION WITH CORONARY  ANGIOGRAM;  Surgeon: Burnell Blanks, MD;  Location: [facility] CATH LAB;  Service: Cardiovascular;  Laterality: N/A;  . PERCUTANEOUS CORONARY STENT INTERVENTION (PCI-S)  05/31/2012   Procedure: PERCUTANEOUS CORONARY STENT INTERVENTION (PCI-S);  Surgeon: Peter M Martinique, MD;  Location: [facility] CATH LAB;  Service: Cardiovascular;;  . PR VEIN BYPASS GRAFT,AORTO-FEM-POP Left 10/01/2009   left below knee popliteal artery to posterior tibial artery  . SHOULDER ARTHROSCOPY WITH OPEN ROTATOR CUFF REPAIR AND DISTAL CLAVICLE ACROMINECTOMY Left 01/12/2013   Procedure: LEFT SHOULDER ARTHROSCOPY WITH DEBRIDEMENT OPEN DISTAL CLAVICLE RESECTION ,acromioplastyAND ROTATOR CUFF REPAIR;  Surgeon: Yvette Rack., MD;  Location: [place];  Service: Orthopedics;  Laterality: Left;       Home Medications    Prior to Admission medications   Medication Sig Start Date End Date Taking? Authorizing Provider  abiraterone Acetate (ZYTIGA) 250 MG tablet Take 1,000 mg by mouth daily. Take on an empty stomach 1 hour before or 2 hours after a meal   Yes [provider]  albuterol (PROVENTIL HFA;VENTOLIN HFA) 108 (90 Base) MCG/ACT inhaler Inhale 2 puffs every 4 (four) hours as needed into the lungs for wheezing or shortness of breath.   Yes [provider]  amLODipine (NORVASC) 10 MG tablet Take 1 tablet (10 mg total) by mouth daily. 05/19/16  Yes Henson, Vickie L, NP-C  aspirin EC 81 MG tablet Take 81 mg by mouth daily.    Yes [provider]  ferrous sulfate 325 (65 FE) MG tablet Take 325 mg by mouth daily with breakfast.   Yes [provider]  gabapentin (NEURONTIN) 100 MG capsule TAKE 1 CAPSULE BY MOUTH AS NEEDED FOR HEADACHE PAIN. MAY REPEAT AFTER 2 HOURS IF NEEDED 08/24/16  Yes [provider]  gabapentin (NEURONTIN) 600 MG tablet Take 600 mg by mouth at bedtime.   Yes [provider]  losartan-hydrochlorothiazide (HYZAAR) 100-25 MG tablet take 1 tablet by mouth once daily 08/30/16   Yes Burnell Blanks, MD  metFORMIN (GLUCOPHAGE) 500 MG tablet Take 500 mg by mouth 2 (two) times daily with a meal. Reported on 08/27/2015   Yes [provider]  nitroGLYCERIN (NITROSTAT) 0.4 MG SL tablet Place 1 tablet (0.4 mg total) under the tongue every 5 (five) minutes as needed for chest pain. 12/08/15  Yes Imogene Burn, PA-C  omeprazole (PRILOSEC) 20 MG capsule Take 20 mg by mouth 2 (two) times daily before a meal.   Yes [provider]  predniSONE (DELTASONE) 5 MG tablet Take 5 mg daily by mouth.   Yes [provider]  tamsulosin (FLOMAX) 0.4 MG CAPS capsule Take 0.4 mg at bedtime by mouth.   Yes [provider]  traMADol (ULTRAM) 50 MG tablet Take 1 tablet (50 mg total) by mouth every 6 (six) hours as needed for moderate pain. 08/18/16  Yes Henson, Vickie L, NP-C    Family History Family History  Problem Relation Age of Onset  . Breast cancer Mother        deceased  . Hyperlipidemia  Mother   . Hypertension Mother   . COPD Father        deceased  . Cancer Father   . Hyperlipidemia Father   . Hypertension Father   . Hypertension Sister   . Cancer Brother   . Hypertension Brother   . Hypertension Daughter   . Hypertension Son   . Colon cancer Neg Hx        pt is unclear about family hx    Social History Social History   Tobacco Use  . Smoking status: Former Smoker    Packs/day: 0.00    Types: Cigarettes    Last attempt to quit: 05/26/2011    Years since quitting: 5.7  . Smokeless tobacco: Never Used  Substance Use Topics  . Alcohol use: No  . Drug use: No     Allergies   Atenolol; Imdur [isosorbide mononitrate]; Lisinopril; Topiramate; and Tramadol-acetaminophen   Review of Systems Review of Systems  Constitutional: Negative for fever.  Respiratory: Positive for shortness of breath. Negative for cough and sputum production.   Cardiovascular: Negative for orthopnea and leg swelling.  All other systems reviewed and  are negative.    Physical Exam Updated Vital Signs BP 122/71   Pulse (!) 56   Temp (!) 97.4 F (36.3 C) (Oral)   Resp 14   Ht 5\' 11"  (1.803 m)   Wt 106.6 kg (235 lb)   SpO2 97%   BMI 32.78 kg/m   Physical Exam  Constitutional: He is oriented to person, place, and time. He appears well-developed and well-nourished. No distress.  HENT:  Head: Normocephalic and atraumatic.  Mouth/Throat: Oropharynx is clear and moist.  Neck: Normal range of motion. Neck supple.  Cardiovascular: Normal rate and regular rhythm. Exam reveals no friction rub.  No murmur heard. Pulmonary/Chest: Effort normal. No respiratory distress. He has wheezes. He has no rales.  There are slight expiratory wheezes bilaterally.  Abdominal: Soft. Bowel sounds are normal. He exhibits no distension. There is no tenderness.  Musculoskeletal: Normal range of motion. He exhibits no edema.  Neurological: He is alert and oriented to person, place, and time. Coordination normal.  Skin: Skin is warm and dry. He is not diaphoretic.  Nursing note and vitals reviewed.    ED Treatments / Results  Labs (all labs ordered are listed, but only abnormal results are displayed) Labs Reviewed  BASIC METABOLIC PANEL - Abnormal; Notable for the following components:      Result Value   Creatinine, Ser 1.30 (*)    GFR calc non Af Amer 51 (*)    GFR calc Af Amer 59 (*)    All other components within normal limits  CBC - Abnormal; Notable for the following components:   Hemoglobin 11.0 (*)    HCT 35.7 (*)    MCV 70.4 (*)    MCH 21.7 (*)    RDW 16.9 (*)    Platelets 134 (*)    All other components within normal limits  I-STAT TROPONIN, ED    EKG  EKG Interpretation  Date/Time:  Thursday March 03 2017 02:01:59 EST Ventricular Rate:  64 PR Interval:  234 QRS Duration: 92 QT Interval:  410 QTC Calculation: 422 R Axis:   54 Text Interpretation:  Sinus rhythm with 1st degree A-V block Otherwise normal ECG Confirmed by  Veryl Speak [phone]) on 03/03/2017 3:48:28 AM       Radiology Dg Chest 2 View  Result Date: 03/03/2017 CLINICAL DATA:  Shortness of breath for  1 hour. EXAM: CHEST  2 VIEW COMPARISON:  06/02/2016 FINDINGS: The cardiomediastinal contours are normal. Atherosclerosis of the aortic arch. Pulmonary vasculature is normal. No consolidation, pleural effusion, or pneumothorax. No acute osseous abnormalities are seen. IMPRESSION: No acute pulmonary process. Electronically Signed   By: Jeb Levering M.D.   On: 03/03/2017 02:34    Procedures Procedures (including critical care time)  Medications Ordered in ED Medications  ipratropium-albuterol (DUONEB) 0.5-2.5 (3) MG/3ML nebulizer solution 3 mL (3 mLs Nebulization Given 03/03/17 0427)  ipratropium-albuterol (DUONEB) 0.5-2.5 (3) MG/3ML nebulizer solution 3 mL (3 mLs Nebulization Given 03/03/17 0604)     Initial Impression / Assessment and Plan / ED Course  I have reviewed the triage vital signs and the nursing notes.  Pertinent labs & imaging results that were available during my care of the patient were reviewed by me and considered in my medical decision making (see chart for details).  Patient presenting with shortness of breath that woke him from sleep at approximately 1 AM.  His workup reveals no abnormality in his chest x-ray and laboratory studies are reassuring.  I see nothing that appears cardiac.  He is feeling better after a nebulizer treatment in the ER and I feel is appropriate for discharge.  To return as needed if he worsens.  Final Clinical Impressions(s) / ED Diagnoses   Final diagnoses:  Shortness of breath    ED Discharge Orders    None       Veryl Speak, MD 03/03/17 [phone]

## 2017-03-04 ENCOUNTER — Other Ambulatory Visit: Payer: Self-pay | Admitting: Medical

## 2017-03-07 ENCOUNTER — Telehealth (HOSPITAL_COMMUNITY): Payer: Self-pay | Admitting: *Deleted

## 2017-03-07 NOTE — Telephone Encounter (Signed)
Patient given detailed instructions per Myocardial Perfusion Study Information Sheet for the test on 03/10/17 at 0715. Patient notified to arrive 15 minutes early and that it is imperative to arrive on time for appointment to keep from having the test rescheduled.  If you need to cancel or reschedule your appointment, please call the office within 24 hours of your appointment. . Patient verbalized understanding.Joel [name] W    

## 2017-03-07 NOTE — Telephone Encounter (Signed)
Can pt have a refill on meds  

## 2017-03-07 NOTE — Telephone Encounter (Signed)
Ok

## 2017-03-10 ENCOUNTER — Ambulatory Visit (HOSPITAL_COMMUNITY): Payer: PPO | Attending: Internal Medicine

## 2017-03-10 DIAGNOSIS — R079 Chest pain, unspecified: Secondary | ICD-10-CM | POA: Insufficient documentation

## 2017-03-10 LAB — MYOCARDIAL PERFUSION IMAGING
LV dias vol: 126 mL (ref 62–150)
LV sys vol: 56 mL
Peak HR: 78 {beats}/min
RATE: 0.3
Rest HR: 51 {beats}/min
SDS: 8
SRS: 0
SSS: 8
TID: 0.95

## 2017-03-10 MED ORDER — TECHNETIUM TC 99M TETROFOSMIN IV KIT
32.4000 | PACK | Freq: Once | INTRAVENOUS | Status: AC | PRN
  Administered 2017-03-10: 32.4 via INTRAVENOUS
  Filled 2017-03-10: qty 33

## 2017-03-10 MED ORDER — TECHNETIUM TC 99M TETROFOSMIN IV KIT
10.5000 | PACK | Freq: Once | INTRAVENOUS | Status: AC | PRN
  Administered 2017-03-10: 10.5 via INTRAVENOUS
  Filled 2017-03-10: qty 11

## 2017-03-10 MED ORDER — REGADENOSON 0.4 MG/5ML IV SOLN
0.4000 mg | Freq: Once | INTRAVENOUS | Status: AC
  Administered 2017-03-10: 0.4 mg via INTRAVENOUS

## 2017-03-22 ENCOUNTER — Ambulatory Visit: Payer: PPO | Admitting: Physician Assistant

## 2017-04-01 DIAGNOSIS — H18413 Arcus senilis, bilateral: Secondary | ICD-10-CM | POA: Diagnosis not present

## 2017-04-01 DIAGNOSIS — H04123 Dry eye syndrome of bilateral lacrimal glands: Secondary | ICD-10-CM | POA: Diagnosis not present

## 2017-04-01 DIAGNOSIS — H11423 Conjunctival edema, bilateral: Secondary | ICD-10-CM | POA: Diagnosis not present

## 2017-04-01 DIAGNOSIS — Z961 Presence of intraocular lens: Secondary | ICD-10-CM | POA: Diagnosis not present

## 2017-04-01 DIAGNOSIS — H11153 Pinguecula, bilateral: Secondary | ICD-10-CM | POA: Diagnosis not present

## 2017-04-01 DIAGNOSIS — Z9849 Cataract extraction status, unspecified eye: Secondary | ICD-10-CM | POA: Diagnosis not present

## 2017-04-01 DIAGNOSIS — H401111 Primary open-angle glaucoma, right eye, mild stage: Secondary | ICD-10-CM | POA: Diagnosis not present

## 2017-04-01 DIAGNOSIS — H47231 Glaucomatous optic atrophy, right eye: Secondary | ICD-10-CM | POA: Diagnosis not present

## 2017-04-12 DIAGNOSIS — D126 Benign neoplasm of colon, unspecified: Secondary | ICD-10-CM | POA: Insufficient documentation

## 2017-04-19 ENCOUNTER — Ambulatory Visit: Payer: PPO | Admitting: Physician Assistant

## 2017-04-19 ENCOUNTER — Encounter: Payer: Self-pay | Admitting: Physician Assistant

## 2017-04-19 VITALS — BP 122/60 | HR 58 | Ht 69.5 in | Wt 238.4 lb

## 2017-04-19 DIAGNOSIS — I2510 Atherosclerotic heart disease of native coronary artery without angina pectoris: Secondary | ICD-10-CM

## 2017-04-19 DIAGNOSIS — I10 Essential (primary) hypertension: Secondary | ICD-10-CM | POA: Diagnosis not present

## 2017-04-19 DIAGNOSIS — R0789 Other chest pain: Secondary | ICD-10-CM | POA: Diagnosis not present

## 2017-04-19 NOTE — Patient Instructions (Addendum)
Medication Instructions:  Your pharmacy should have notified you if your medications have been recalled. If you would like to make sure, call your pharmacy to verify.  Labwork: None  Testing/Procedures: None  Follow-Up: You have an appointment scheduled with Dr. Angelena Form 08/01/2017 at 10:20AM. Please arrive 15 minutes early for this appointment.  Any Other Special Instructions Will Be Listed Below (If Applicable).     If you need a refill on your cardiac medications before your next appointment, please call your pharmacy.

## 2017-04-19 NOTE — Progress Notes (Signed)
Cardiology Office Note    Date:  04/19/2017   ID:  [name], DOB 15-Nov-1940, MRN [id number]  PCP:  Girtha Rm, NP-C  Cardiologist: Dr. Angelena Form  No chief complaint on file.   History of Present Illness:  Joel [name] is a 77 y.o. male with history of HTN, HLD, CAD, PAD, DM.Cardiac cath February 2014 with moderate LAD and RCA disease, severe OM2 stenosis treated with bare metal stent. Admitted to [facility] 11/09/12 with chest pain. Ruled out for MI. Lexiscan stress myoview 11/16/12 without ischemia. Normal LVEF.  Cardiac cath 08/01/13 with patent stent Circumflex and mild to moderate disease in the LAD and RCA. His PAD is followed in [facility] by Dr. Scot Dock. Seen in the ED by Dr. Ron Parker 12/15/13 with c/o chest pain. Negative troponin and discharged home. He was seen here 03/04/14 and had c/o chest pain after working on his car but quickly resolved. He was seen in the ED 09/10/14 with atypical chest pain. Troponin was negative x 2 and EKG was unchanged.    Last saw Dr.McAlhany 04/2015 with chronic chest pain that did not sound cardiac. Most likely GERD versus musculoskeletal pain. Medical management recommended.   The patient was in the emergency room with chest pain 06/2015 07/2015 and most recently 09/2015 at which time troponin was negative and EKG and chest x-ray unremarkable.  Patient saw Ellen Henri, PA-C 03/01/17 and he was complaining of more chest pain.  Nuclear stress test 03/10/17 showed EF 55% with moderate inferior defect that partially improves consistent with ischemia and possible soft tissue attenuation.  Small region of anterior anterior septal ischemia.  Read as an intermediate study.  Patient was in the ER 03/03/17 with shortness of breath that woke him from sleep and improved with nebulizer.  Chest x-ray was reassuring.  Patient comes in today for follow-up.  He says he is doing about the same.  He has an occasional tickle in his chest that is relieved with tramadol.  He is  asking if we can prescribe this for him but I told him to ask primary care.  He is also concerned about the losartan recall although he has not been notified by his pharmacy that his was recalled.  No chest tightness or pressure.  He does have some wheezing that has worsened recently and he is using his inhaler more often.   Past Medical History:  Diagnosis Date  . Atypical chest pain   . CAD (coronary artery disease)    a. Multiple prior evaluations then 05/2012 s/p BMS to OM2. b. Nuc 11/2012 wnl. c. Cath 2015: patent stent, moderate LAD/RCA dz, treated medically.  . Cancer ([place])    Hx: of prostate  . Cataract   . CKD (chronic kidney disease), stage II   . COPD (chronic obstructive pulmonary disease) ([place])   . Cough secondary to angiotensin converting enzyme inhibitor (ACE-I)   . Diabetes mellitus without complication ([place])   . Diverticulosis   . DVT (deep venous thrombosis) ([place])   . GERD (gastroesophageal reflux disease)   . Headache(784.0)    Hx: of  . History of blood transfusion    "I've had 2; don't know what it was related to" (11/09/2012)  . HLD (hyperlipidemia)   . HTN (hypertension)   . Iron deficiency anemia   . Microcytic anemia   . Peripheral vascular disease ([place])    a. L pop-tibial bypass 2011, followed by [facility].  . Rotator cuff injury    "left arm;  never repaired" (11/09/2012)    Past Surgical History:  Procedure Laterality Date  . CARDIOVASCULAR STRESS TEST  Aug. 2014  . CATARACT EXTRACTION W/ INTRAOCULAR LENS IMPLANT Right ~ 2008  . CIRCUMCISION    . COLONOSCOPY    . CORONARY ANGIOPLASTY WITH STENT PLACEMENT  2014   "1" (11/09/2012)  . ILIAC ARTERY ANEURYSM REPAIR    . LEFT HEART CATH  05-31-12  . LEFT HEART CATHETERIZATION WITH CORONARY ANGIOGRAM N/A 06/01/2011   Procedure: LEFT HEART CATHETERIZATION WITH CORONARY ANGIOGRAM;  Surgeon: Hillary Bow, MD;  Location: [facility] CATH LAB;  Service: Cardiovascular;  Laterality: N/A;  . LEFT HEART CATHETERIZATION WITH  CORONARY ANGIOGRAM N/A 05/31/2012   Procedure: LEFT HEART CATHETERIZATION WITH CORONARY ANGIOGRAM;  Surgeon: Peter M Martinique, MD;  Location: [facility] CATH LAB;  Service: Cardiovascular;  Laterality: N/A;  . LEFT HEART CATHETERIZATION WITH CORONARY ANGIOGRAM N/A 08/01/2013   Procedure: LEFT HEART CATHETERIZATION WITH CORONARY ANGIOGRAM;  Surgeon: Burnell Blanks, MD;  Location: [facility] CATH LAB;  Service: Cardiovascular;  Laterality: N/A;  . PERCUTANEOUS CORONARY STENT INTERVENTION (PCI-S)  05/31/2012   Procedure: PERCUTANEOUS CORONARY STENT INTERVENTION (PCI-S);  Surgeon: Peter M Martinique, MD;  Location: [facility] CATH LAB;  Service: Cardiovascular;;  . PR VEIN BYPASS GRAFT,AORTO-FEM-POP Left 10/01/2009   left below knee popliteal artery to posterior tibial artery  . SHOULDER ARTHROSCOPY WITH OPEN ROTATOR CUFF REPAIR AND DISTAL CLAVICLE ACROMINECTOMY Left 01/12/2013   Procedure: LEFT SHOULDER ARTHROSCOPY WITH DEBRIDEMENT OPEN DISTAL CLAVICLE RESECTION ,acromioplastyAND ROTATOR CUFF REPAIR;  Surgeon: Yvette Rack., MD;  Location: [facility];  Service: Orthopedics;  Laterality: Left;    Current Medications: Current Meds  Medication Sig  . abiraterone Acetate (ZYTIGA) 250 MG tablet Take 1,000 mg by mouth daily. Take on an empty stomach 1 hour before or 2 hours after a meal  . albuterol (PROVENTIL HFA;VENTOLIN HFA) 108 (90 Base) MCG/ACT inhaler Inhale 2 puffs every 4 (four) hours as needed into the lungs for wheezing or shortness of breath.  Marland Kitchen amLODipine (NORVASC) 10 MG tablet Take 1 tablet (10 mg total) by mouth daily.  Marland Kitchen aspirin EC 81 MG tablet Take 81 mg by mouth daily.   . ferrous sulfate 325 (65 FE) MG tablet Take 325 mg by mouth daily with breakfast.  . gabapentin (NEURONTIN) 100 MG capsule TAKE 1 CAPSULE BY MOUTH AS NEEDED FOR HEADACHE PAIN. MAY REPEAT AFTER 2 HOURS IF NEEDED  . gabapentin (NEURONTIN) 600 MG tablet Take 600 mg by mouth at bedtime.  Marland Kitchen losartan-hydrochlorothiazide (HYZAAR) 100-25 MG tablet take 1  tablet by mouth once daily  . metFORMIN (GLUCOPHAGE) 500 MG tablet Take 500 mg by mouth 2 (two) times daily with a meal. Reported on 08/27/2015  . nitroGLYCERIN (NITROSTAT) 0.4 MG SL tablet Place 1 tablet (0.4 mg total) under the tongue every 5 (five) minutes as needed for chest pain.  Marland Kitchen omeprazole (PRILOSEC) 20 MG capsule Take 20 mg by mouth 2 (two) times daily before a meal.  . predniSONE (DELTASONE) 5 MG tablet Take 5 mg daily by mouth.  . tamsulosin (FLOMAX) 0.4 MG CAPS capsule Take 0.4 mg at bedtime by mouth.  . traMADol (ULTRAM) 50 MG tablet take 1 tablet by mouth every 6 hours if needed FOR PAIN   Current Facility-Administered Medications for the 04/19/17 encounter (Office Visit) with Imogene Burn, PA-C  Medication  . 0.9 %  sodium chloride infusion     Allergies:   Atenolol; Imdur [isosorbide mononitrate]; Lisinopril; Topiramate; and Tramadol-acetaminophen  Social History   Socioeconomic History  . Marital status: Widowed    Spouse name: None  . Number of children: None  . Years of education: None  . Highest education level: None  Social Needs  . Financial resource strain: None  . Food insecurity - worry: None  . Food insecurity - inability: None  . Transportation needs - medical: None  . Transportation needs - non-medical: None  Occupational History  . Occupation: Retired  Tobacco Use  . Smoking status: Former Smoker    Packs/day: 0.00    Types: Cigarettes    Last attempt to quit: 05/26/2011    Years since quitting: 5.9  . Smokeless tobacco: Never Used  Substance and Sexual Activity  . Alcohol use: No  . Drug use: No  . Sexual activity: None  Other Topics Concern  . None  Social History Narrative   Single. Retired. 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  Lives in [place] by himself.     Family History:  The patient's family history includes Breast cancer in his mother; COPD in his father; Cancer in his brother and father; Hyperlipidemia in his father and mother;  Hypertension in his brother, daughter, father, mother, sister, and son.   ROS:   Please see the history of present illness.    Review of Systems  Constitution: Negative.  HENT: Negative.   Cardiovascular: Positive for chest pain and dyspnea on exertion.  Respiratory: Positive for wheezing.   Endocrine: Negative.   Hematologic/Lymphatic: Negative.   Musculoskeletal: Positive for back pain.  Gastrointestinal: Negative.   Genitourinary: Negative.   Neurological: Negative.    All other systems reviewed and are negative.   PHYSICAL EXAM:   VS:  BP 122/60   Pulse (!) 58   Ht 5' 9.5" (1.765 m)   Wt 238 lb 6.4 oz (108.1 kg)   BMI 34.70 kg/m   Physical Exam  GEN: Well nourished, well developed, in no acute distress  Neck: no JVD, carotid bruits, or masses Cardiac:RRR; positive S4, no murmur Respiratory:  clear to auscultation bilaterally, normal work of breathing GI: soft, nontender, nondistended, + BS Ext: without cyanosis, clubbing, or edema, Good distal pulses bilaterally Neuro:  Alert and Oriented x 3 Psych: euthymic mood, full affect  Wt Readings from Last 3 Encounters:  04/19/17 238 lb 6.4 oz (108.1 kg)  03/10/17 235 lb (106.6 kg)  03/03/17 235 lb (106.6 kg)      Studies/Labs Reviewed:   EKG:  EKG is not ordered today.    Recent Labs: 11/12/2016: ALT 13 03/03/2017: BUN 16; Creatinine, Ser 1.30; Hemoglobin 11.0; Platelets 134; Potassium 3.8; Sodium 139   Lipid Panel    Component Value Date/Time   CHOL 164 02/06/2013 0741   TRIG 122.0 02/06/2013 0741   HDL 34.30 (L) 02/06/2013 0741   CHOLHDL 5 02/06/2013 0741   VLDL 24.4 02/06/2013 0741   LDLCALC 105 (H) 02/06/2013 0741    Additional studies/ records that were reviewed today include:  Nuclear stress test 11/29/18Study Highlights    Nuclear stress EF: 55%.  Moderate sided inferior defect that partially improves consistent with ischemia and possible soft tissue attenuation. Small region of  anterior/anteroseptal ischemia. (base)  This is an intermediate risk study.       Cath 07/2013 Angiographic Findings:   Left main: No obstructive disease.     Left Anterior Descending Artery: Large caliber vessel that courses to the apex. The proximal and mid vessel is ectatic. There is diffuse 30-40% stenosis in the  mid vessel. There are mild luminal irregularities in the distal vessel.     Circumflex Artery: Large caliber vessel with two obtuse marginal branches. The first OM branch is small in caliber and is chronically occluded in the mid vessel. The second OM branch is patent with patent stent, minimal restenosis. The distal segment of the OM branch has diffuse plaque.     Right Coronary Artery: Large caliber dominant vessel with proximal Shepherd's crook. The proximal vessel has diffuse 40% stenosis. The mid vessel is ectatic with 40% stenosis. The distal vessel has diffuse 50% stenosis. The PDA has 50% proximal stenosis followed by diffuse plaque.    Left Ventricular Angiogram: LVEF=65%.    Impression: 1. Triple vessel CAD with patent stent in the second OM branch.   2. Moderate non-obstructive disease in the LAD and RCA 3. Normal LV function   Recommendations: Continue medical management of CAD.                 ASSESSMENT:    1. Chest pain, atypical   2. Coronary artery disease involving native coronary artery of native heart without angina pectoris   3. Essential hypertension      PLAN:  In order of problems listed above:  Pain described as a tickle and relieved with tramadol atypical.  Does have an intermediate risk Myoview 03/10/17 as described above.  Small area of anterior/anterior septal ischemia at the base.  He is not having any current angina.  Could add Imdur in the future if he develops anginal symptoms.  Follow-up with Dr. Angelena Form.  CAD last cath in 2015 with moderate LAD and RCA disease and patent circumflex stent.  No further chest  pain.  Hypertension well-controlled asked him to contact his pharmacy concerning recalls on his losartan.  [facility], doesn't appear to be on a statin-will verify with patient.  Medication Adjustments/Labs and Tests Ordered: Current medicines are reviewed at length with the patient today.  Concerns regarding medicines are outlined above.  Medication changes, Labs and Tests ordered today are listed in the Patient Instructions below. Patient Instructions  Medication Instructions:  Your pharmacy should have notified you if your medications have been recalled. If you would like to make sure, call your pharmacy to verify.  Labwork: None  Testing/Procedures: None  Follow-Up: You have an appointment scheduled with Dr. Angelena Form 08/01/2017 at 10:20AM. Please arrive 15 minutes early for this appointment.  Any Other Special Instructions Will Be Listed Below (If Applicable).     If you need a refill on your cardiac medications before your next appointment, please call your pharmacy.      Sumner Boast, PA-C  04/19/2017 10:25 AM    [facility] [place], [place],   [address] Phone: [phone]; Fax: [phone]

## 2017-04-20 ENCOUNTER — Other Ambulatory Visit: Payer: Self-pay

## 2017-04-20 ENCOUNTER — Encounter (HOSPITAL_COMMUNITY): Payer: Self-pay

## 2017-04-20 ENCOUNTER — Emergency Department (HOSPITAL_COMMUNITY): Payer: PPO

## 2017-04-20 DIAGNOSIS — E785 Hyperlipidemia, unspecified: Secondary | ICD-10-CM | POA: Insufficient documentation

## 2017-04-20 DIAGNOSIS — R0602 Shortness of breath: Secondary | ICD-10-CM | POA: Diagnosis not present

## 2017-04-20 DIAGNOSIS — Z7982 Long term (current) use of aspirin: Secondary | ICD-10-CM | POA: Insufficient documentation

## 2017-04-20 DIAGNOSIS — Z8673 Personal history of transient ischemic attack (TIA), and cerebral infarction without residual deficits: Secondary | ICD-10-CM | POA: Diagnosis not present

## 2017-04-20 DIAGNOSIS — J441 Chronic obstructive pulmonary disease with (acute) exacerbation: Secondary | ICD-10-CM | POA: Insufficient documentation

## 2017-04-20 DIAGNOSIS — N182 Chronic kidney disease, stage 2 (mild): Secondary | ICD-10-CM | POA: Diagnosis not present

## 2017-04-20 DIAGNOSIS — Z87891 Personal history of nicotine dependence: Secondary | ICD-10-CM | POA: Diagnosis not present

## 2017-04-20 DIAGNOSIS — I129 Hypertensive chronic kidney disease with stage 1 through stage 4 chronic kidney disease, or unspecified chronic kidney disease: Secondary | ICD-10-CM | POA: Insufficient documentation

## 2017-04-20 DIAGNOSIS — Z86718 Personal history of other venous thrombosis and embolism: Secondary | ICD-10-CM | POA: Diagnosis not present

## 2017-04-20 DIAGNOSIS — E119 Type 2 diabetes mellitus without complications: Secondary | ICD-10-CM | POA: Diagnosis not present

## 2017-04-20 DIAGNOSIS — I2510 Atherosclerotic heart disease of native coronary artery without angina pectoris: Secondary | ICD-10-CM | POA: Diagnosis not present

## 2017-04-20 DIAGNOSIS — Z79899 Other long term (current) drug therapy: Secondary | ICD-10-CM | POA: Diagnosis not present

## 2017-04-20 LAB — BASIC METABOLIC PANEL
Anion gap: 9 (ref 5–15)
BUN: 10 mg/dL (ref 6–20)
CO2: 28 mmol/L (ref 22–32)
Calcium: 9.2 mg/dL (ref 8.9–10.3)
Chloride: 101 mmol/L (ref 101–111)
Creatinine, Ser: 1.17 mg/dL (ref 0.61–1.24)
GFR calc Af Amer: >60 mL/min (ref >60)
GFR calc non Af Amer: 58 mL/min — ABNORMAL LOW (ref >60)
Glucose, Bld: 96 mg/dL (ref 65–99)
Potassium: 3.3 mmol/L — ABNORMAL LOW (ref 3.5–5.1)
Sodium: 138 mmol/L (ref 135–145)

## 2017-04-20 LAB — I-STAT TROPONIN, ED: Troponin i, poc: 0 ng/mL (ref 0.00–0.08)

## 2017-04-20 LAB — CBC
HCT: 34.5 % — ABNORMAL LOW (ref 39.0–52.0)
Hemoglobin: 10.6 g/dL — ABNORMAL LOW (ref 13.0–17.0)
MCH: 21.9 pg — ABNORMAL LOW (ref 26.0–34.0)
MCHC: 30.7 g/dL (ref 30.0–36.0)
MCV: 71.4 fL — ABNORMAL LOW (ref 78.0–100.0)
Platelets: 164 10*3/uL (ref 150–400)
RBC: 4.83 MIL/uL (ref 4.22–5.81)
RDW: 16.8 % — ABNORMAL HIGH (ref 11.5–15.5)
WBC: 4.7 10*3/uL (ref 4.0–10.5)

## 2017-04-20 NOTE — ED Triage Notes (Signed)
Pt states that he has felt SOB since this AM. Hx of COPD, no relieved by inhaler. Reports having CP earlier that was relieved by nitro. NAD

## 2017-04-21 ENCOUNTER — Encounter (HOSPITAL_COMMUNITY): Payer: Self-pay | Admitting: Emergency Medicine

## 2017-04-21 ENCOUNTER — Emergency Department (HOSPITAL_COMMUNITY)
Admission: EM | Admit: 2017-04-21 | Discharge: 2017-04-21 | Disposition: A | Discharge status: Home / Self Care | Payer: PPO | Attending: Emergency Medicine | Admitting: Emergency Medicine

## 2017-04-21 DIAGNOSIS — J441 Chronic obstructive pulmonary disease with (acute) exacerbation: Secondary | ICD-10-CM

## 2017-04-21 LAB — I-STAT TROPONIN, ED: Troponin i, poc: 0.01 ng/mL (ref 0.00–0.08)

## 2017-04-21 MED ORDER — DOXYCYCLINE HYCLATE 100 MG PO CAPS: 100.0000 mg | ORAL_CAPSULE | Freq: Two times a day (BID) | ORAL | 0 refills | Status: DC

## 2017-04-21 MED ORDER — IPRATROPIUM-ALBUTEROL 0.5-2.5 (3) MG/3ML IN SOLN
3.0000 mL | Freq: Once | RESPIRATORY_TRACT | Status: AC
  Administered 2017-04-21: 3 mL via RESPIRATORY_TRACT
  Filled 2017-04-21: qty 3

## 2017-04-21 MED ORDER — IPRATROPIUM-ALBUTEROL 20-100 MCG/ACT IN AERS: 1.0000 | INHALATION_SPRAY | Freq: Four times a day (QID) | RESPIRATORY_TRACT | 0 refills | Status: DC

## 2017-04-21 MED ORDER — PREDNISONE 20 MG PO TABS: ORAL_TABLET | ORAL | 0 refills | Status: DC

## 2017-04-21 MED ORDER — DEXAMETHASONE SODIUM PHOSPHATE 10 MG/ML IJ SOLN
10.0000 mg | Freq: Once | INTRAMUSCULAR | Status: AC
  Administered 2017-04-21: 10 mg via INTRAMUSCULAR
  Filled 2017-04-21: qty 1

## 2017-04-21 NOTE — ED Provider Notes (Signed)
[facility] EMERGENCY DEPARTMENT Provider Note   CSN: [id number] Arrival date & time: 04/20/17  2111     History   Chief Complaint Chief Complaint  Patient presents with  . Shortness of Breath    HPI Joel [name] is a 77 y.o. male.  The history is provided by the patient.  Shortness of Breath  This is a recurrent problem. The problem occurs continuously.The current episode started 6 to 12 hours ago. The problem has not changed since onset.Associated symptoms include wheezing. Pertinent negatives include no coryza, no rhinorrhea, no neck pain, no cough, no sputum production, no PND, no orthopnea, no chest pain, no syncope, no vomiting, no abdominal pain, no rash, no leg pain, no leg swelling and no claudication. It is unknown what precipitated the problem. Risk factors: none. He has tried beta-agonist inhalers for the symptoms. The treatment provided no relief. He has had prior hospitalizations. He has had prior ED visits. He has had prior ICU admissions. Associated medical issues include COPD.    Past Medical History:  Diagnosis Date  . Atypical chest pain   . CAD (coronary artery disease)    a. Multiple prior evaluations then 05/2012 s/p BMS to OM2. b. Nuc 11/2012 wnl. c. Cath 2015: patent stent, moderate LAD/RCA dz, treated medically.  . Cancer ([place])    Hx: of prostate  . Cataract   . CKD (chronic kidney disease), stage II   . COPD (chronic obstructive pulmonary disease) ([place])   . Cough secondary to angiotensin converting enzyme inhibitor (ACE-I)   . Diabetes mellitus without complication ([place])   . Diverticulosis   . DVT (deep venous thrombosis) ([place])   . GERD (gastroesophageal reflux disease)   . Headache(784.0)    Hx: of  . History of blood transfusion    "I've had 2; don't know what it was related to" (11/09/2012)  . HLD (hyperlipidemia)   . HTN (hypertension)   . Iron deficiency anemia   . Microcytic anemia   . Peripheral vascular disease ([place])     a. L pop-tibial bypass 2011, followed by [facility].  . Rotator cuff injury    "left arm; never repaired" (11/09/2012)    Patient Active Problem List   Diagnosis Date Noted  . Elliptocytosis on peripheral blood smear ([place]) 09/13/2016  . Prediabetes 03/22/2016  . PVD (peripheral vascular disease) with claudication ([place]) 11/15/2012  . Chest pain, localized 11/10/2012  . Hypokalemia 11/10/2012  . Bradycardia 11/09/2012  . Thrombocytopenia ([place]) 06/01/2012  . Microcytic anemia   . Atherosclerosis of native arteries of the extremities with intermittent claudication 06/16/2011  . Stable angina ([place]) 06/11/2011  . CAD (coronary artery disease)   . Chest pain, atypical 05/30/2011  . Iron deficiency anemia 10/03/2009  . DIVERTICULOSIS OF COLON 10/03/2009  . WEIGHT LOSS, ABNORMAL 10/03/2009  . CHEST PAIN UNSPECIFIED 09/13/2008  . Headache(784.0) 08/19/2008  . TIA 06/05/2008  . SHOULDER PAIN, LEFT 10/11/2007  . COUGH DUE TO ACE INHIBITORS 05/07/2007  . History of tobacco use disorder 04/25/2007  . Hyperlipidemia 04/21/2007  . Essential hypertension 04/21/2007  . GERD 04/21/2007    Past Surgical History:  Procedure Laterality Date  . CARDIOVASCULAR STRESS TEST  Aug. 2014  . CATARACT EXTRACTION W/ INTRAOCULAR LENS IMPLANT Right ~ 2008  . CIRCUMCISION    . COLONOSCOPY    . CORONARY ANGIOPLASTY WITH STENT PLACEMENT  2014   "1" (11/09/2012)  . ILIAC ARTERY ANEURYSM REPAIR    . LEFT HEART CATH  05-31-12  .  LEFT HEART CATHETERIZATION WITH CORONARY ANGIOGRAM N/A 06/01/2011   Procedure: LEFT HEART CATHETERIZATION WITH CORONARY ANGIOGRAM;  Surgeon: Hillary Bow, MD;  Location: [facility] CATH LAB;  Service: Cardiovascular;  Laterality: N/A;  . LEFT HEART CATHETERIZATION WITH CORONARY ANGIOGRAM N/A 05/31/2012   Procedure: LEFT HEART CATHETERIZATION WITH CORONARY ANGIOGRAM;  Surgeon: Peter M Martinique, MD;  Location: [facility] CATH LAB;  Service: Cardiovascular;  Laterality: N/A;  . LEFT HEART CATHETERIZATION WITH  CORONARY ANGIOGRAM N/A 08/01/2013   Procedure: LEFT HEART CATHETERIZATION WITH CORONARY ANGIOGRAM;  Surgeon: Burnell Blanks, MD;  Location: [facility] CATH LAB;  Service: Cardiovascular;  Laterality: N/A;  . PERCUTANEOUS CORONARY STENT INTERVENTION (PCI-S)  05/31/2012   Procedure: PERCUTANEOUS CORONARY STENT INTERVENTION (PCI-S);  Surgeon: Peter M Martinique, MD;  Location: [facility] CATH LAB;  Service: Cardiovascular;;  . PR VEIN BYPASS GRAFT,AORTO-FEM-POP Left 10/01/2009   left below knee popliteal artery to posterior tibial artery  . SHOULDER ARTHROSCOPY WITH OPEN ROTATOR CUFF REPAIR AND DISTAL CLAVICLE ACROMINECTOMY Left 01/12/2013   Procedure: LEFT SHOULDER ARTHROSCOPY WITH DEBRIDEMENT OPEN DISTAL CLAVICLE RESECTION ,acromioplastyAND ROTATOR CUFF REPAIR;  Surgeon: Yvette Rack., MD;  Location: [place];  Service: Orthopedics;  Laterality: Left;       Home Medications    Prior to Admission medications   Medication Sig Start Date End Date Taking? Authorizing Provider  abiraterone Acetate (ZYTIGA) 250 MG tablet Take 1,000 mg by mouth daily. Take on an empty stomach 1 hour before or 2 hours after a meal    [provider]  albuterol (PROVENTIL HFA;VENTOLIN HFA) 108 (90 Base) MCG/ACT inhaler Inhale 2 puffs every 4 (four) hours as needed into the lungs for wheezing or shortness of breath.    [provider]  amLODipine (NORVASC) 10 MG tablet Take 1 tablet (10 mg total) by mouth daily. 05/19/16   Henson, Vickie L, NP-C  aspirin EC 81 MG tablet Take 81 mg by mouth daily.     [provider]  ferrous sulfate 325 (65 FE) MG tablet Take 325 mg by mouth daily with breakfast.    [provider]  gabapentin (NEURONTIN) 100 MG capsule TAKE 1 CAPSULE BY MOUTH AS NEEDED FOR HEADACHE PAIN. MAY REPEAT AFTER 2 HOURS IF NEEDED 08/24/16   [provider]  gabapentin (NEURONTIN) 600 MG tablet Take 600 mg by mouth at bedtime.    [provider]  losartan-hydrochlorothiazide  Konrad Penta) 100-25 MG tablet take 1 tablet by mouth once daily 08/30/16   Burnell Blanks, MD  metFORMIN (GLUCOPHAGE) 500 MG tablet Take 500 mg by mouth 2 (two) times daily with a meal. Reported on 08/27/2015    [provider]  nitroGLYCERIN (NITROSTAT) 0.4 MG SL tablet Place 1 tablet (0.4 mg total) under the tongue every 5 (five) minutes as needed for chest pain. 12/08/15   Imogene Burn, PA-C  omeprazole (PRILOSEC) 20 MG capsule Take 20 mg by mouth 2 (two) times daily before a meal.    [provider]  predniSONE (DELTASONE) 5 MG tablet Take 5 mg daily by mouth.    [provider]  tamsulosin (FLOMAX) 0.4 MG CAPS capsule Take 0.4 mg at bedtime by mouth.    [provider]  traMADol (ULTRAM) 50 MG tablet take 1 tablet by mouth every 6 hours if needed FOR PAIN 03/07/17   Tysinger, Camelia Eng, PA-C    Family History Family History  Problem Relation Age of Onset  . Breast cancer Mother  deceased  . Hyperlipidemia Mother   . Hypertension Mother   . COPD Father        deceased  . Cancer Father   . Hyperlipidemia Father   . Hypertension Father   . Hypertension Sister   . Cancer Brother   . Hypertension Brother   . Hypertension Daughter   . Hypertension Son   . Colon cancer Neg Hx        pt is unclear about family hx    Social History Social History   Tobacco Use  . Smoking status: Former Smoker    Packs/day: 0.00    Types: Cigarettes    Last attempt to quit: 05/26/2011    Years since quitting: 5.9  . Smokeless tobacco: Never Used  Substance Use Topics  . Alcohol use: No  . Drug use: No     Allergies   Atenolol; Imdur [isosorbide mononitrate]; Lisinopril; Topiramate; and Tramadol-acetaminophen   Review of Systems Review of Systems  HENT: Negative for rhinorrhea.   Respiratory: Positive for shortness of breath and wheezing. Negative for cough and sputum production.   Cardiovascular: Negative for chest pain, orthopnea,  claudication, leg swelling, syncope and PND.  Gastrointestinal: Negative for abdominal pain and vomiting.  Musculoskeletal: Negative for neck pain.  Skin: Negative for rash.  All other systems reviewed and are negative.    Physical Exam Updated Vital Signs BP 131/68   Pulse 66   Temp 98.2 F (36.8 C) (Oral)   Resp 15   SpO2 100%   Physical Exam  Constitutional: He is oriented to person, place, and time. He appears well-developed and well-nourished. No distress.  HENT:  Head: Normocephalic and atraumatic.  Nose: Nose normal.  Mouth/Throat: Oropharynx is clear and moist. No oropharyngeal exudate.  Eyes: Conjunctivae are normal. Pupils are equal, round, and reactive to light.  Neck: Normal range of motion. Neck supple. No JVD present.  Cardiovascular: Normal rate, regular rhythm, normal heart sounds and intact distal pulses.  Pulmonary/Chest: Effort normal. No stridor. No respiratory distress. He has wheezes. He has no rales.  Abdominal: Soft. Bowel sounds are normal. He exhibits no mass. There is no tenderness. There is no rebound and no guarding.  Musculoskeletal: Normal range of motion. He exhibits no edema or tenderness.  Lymphadenopathy:    He has no cervical adenopathy.  Neurological: He is alert and oriented to person, place, and time. He displays normal reflexes.  Skin: Skin is warm and dry. Capillary refill takes less than 2 seconds.  Psychiatric: He has a normal mood and affect.     ED Treatments / Results  Labs (all labs ordered are listed, but only abnormal results are displayed)  Results for orders placed or performed during the hospital encounter of 67/61/95  Basic metabolic panel  Result Value Ref Range   Sodium 138 135 - 145 mmol/L   Potassium 3.3 (L) 3.5 - 5.1 mmol/L   Chloride 101 101 - 111 mmol/L   CO2 28 22 - 32 mmol/L   Glucose, Bld 96 65 - 99 mg/dL   BUN 10 6 - 20 mg/dL   Creatinine, Ser 1.17 0.61 - 1.24 mg/dL   Calcium 9.2 8.9 - 10.3 mg/dL   GFR  calc non Af Amer 58 (L) >60 mL/min   GFR calc Af Amer >60 >60 mL/min   Anion gap 9 5 - 15  CBC  Result Value Ref Range   WBC 4.7 4.0 - 10.5 K/uL   RBC 4.83 4.22 - 5.81 MIL/uL  Hemoglobin 10.6 (L) 13.0 - 17.0 g/dL   HCT 34.5 (L) 39.0 - 52.0 %   MCV 71.4 (L) 78.0 - 100.0 fL   MCH 21.9 (L) 26.0 - 34.0 pg   MCHC 30.7 30.0 - 36.0 g/dL   RDW 16.8 (H) 11.5 - 15.5 %   Platelets 164 150 - 400 K/uL  I-stat troponin, ED  Result Value Ref Range   Troponin i, poc 0.00 0.00 - 0.08 ng/mL   Comment 3           Dg Chest 2 View  Result Date: 04/20/2017 CLINICAL DATA:  Shortness of breath.  COPD. EXAM: CHEST  2 VIEW COMPARISON:  03/03/2017 FINDINGS: Cardiomediastinal silhouette is normal. Mediastinal contours appear intact. Calcific atherosclerotic disease of the aorta and tortuosity. There is no evidence of focal airspace consolidation, pleural effusion or pneumothorax. Low lung volumes. Osseous structures are without acute abnormality. Soft tissues are grossly normal. IMPRESSION: No active cardiopulmonary disease. Low lung volumes. Electronically Signed   By: Fidela Salisbury M.D.   On: 04/20/2017 22:09    EKG  EKG Interpretation  Date/Time:  Wednesday April 20 2017 21:27:39 EST Ventricular Rate:  64 PR Interval:  230 QRS Duration: 100 QT Interval:  422 QTC Calculation: 435 R Axis:   24 Text Interpretation:  Sinus rhythm with 1st degree A-V block Otherwise normal ECG When compared with ECG of 03/03/2017, No significant change was found Confirmed by Delora Fuel ([id number]) on 04/20/2017 11:14:16 PM       Radiology Dg Chest 2 View  Result Date: 04/20/2017 CLINICAL DATA:  Shortness of breath.  COPD. EXAM: CHEST  2 VIEW COMPARISON:  03/03/2017 FINDINGS: Cardiomediastinal silhouette is normal. Mediastinal contours appear intact. Calcific atherosclerotic disease of the aorta and tortuosity. There is no evidence of focal airspace consolidation, pleural effusion or pneumothorax. Low lung volumes.  Osseous structures are without acute abnormality. Soft tissues are grossly normal. IMPRESSION: No active cardiopulmonary disease. Low lung volumes. Electronically Signed   By: Fidela Salisbury M.D.   On: 04/20/2017 22:09    Procedures Procedures (including critical care time)  Medications Ordered in ED  Medications  dexamethasone (DECADRON) injection 10 mg (not administered)  ipratropium-albuterol (DUONEB) 0.5-2.5 (3) MG/3ML nebulizer solution 3 mL (3 mLs Nebulization Given 04/21/17 0333)    Final Clinical Impressions(s) / ED Diagnoses  Symptoms consistent with COPD exacerbation will treat with albuterol doxycycline and steroids.  Follow up with your PMD.    Return for worsening pain, fevers > 100.4 unrelieved by medication, shortness of breath, intractable vomiting, or diarrhea, abdominal pain, Inability to tolerate liquids or food, cough, altered mental status or any concerns. No signs of systemic illness or infection. The patient is nontoxic-appearing on exam and vital signs are within normal limits.    I have reviewed the triage vital signs and the nursing notes. Pertinent labs &imaging results that were available during my care of the patient were reviewed by me and considered in my medical decision making (see chart for details).  After history, exam, and medical workup I feel the patient has been appropriately medically screened and is safe for discharge home. Pertinent diagnoses were discussed with the patient. Patient was given return precautions.       Joel Benninger, MD 04/21/17 0600

## 2017-04-21 NOTE — ED Notes (Signed)
Pt departed in NAD, refused use of wheelchair.  

## 2017-05-17 ENCOUNTER — Telehealth: Payer: Self-pay | Admitting: Family Medicine

## 2017-05-17 MED ORDER — AMLODIPINE BESYLATE 10 MG PO TABS: 10.0000 mg | ORAL_TABLET | Freq: Every day | ORAL | 2 refills | Status: DC

## 2017-05-17 NOTE — Telephone Encounter (Signed)
Pt called and is requesting a refill on his amlodipine pt uses [id number], [place]

## 2017-05-17 NOTE — Telephone Encounter (Signed)
done

## 2017-05-19 DIAGNOSIS — R51 Headache: Secondary | ICD-10-CM | POA: Diagnosis not present

## 2017-05-23 DIAGNOSIS — Z961 Presence of intraocular lens: Secondary | ICD-10-CM | POA: Diagnosis not present

## 2017-05-23 DIAGNOSIS — H11423 Conjunctival edema, bilateral: Secondary | ICD-10-CM | POA: Diagnosis not present

## 2017-05-23 DIAGNOSIS — H401111 Primary open-angle glaucoma, right eye, mild stage: Secondary | ICD-10-CM | POA: Diagnosis not present

## 2017-05-23 DIAGNOSIS — H11153 Pinguecula, bilateral: Secondary | ICD-10-CM | POA: Diagnosis not present

## 2017-05-23 DIAGNOSIS — H04123 Dry eye syndrome of bilateral lacrimal glands: Secondary | ICD-10-CM | POA: Diagnosis not present

## 2017-05-23 DIAGNOSIS — H2513 Age-related nuclear cataract, bilateral: Secondary | ICD-10-CM | POA: Diagnosis not present

## 2017-05-23 DIAGNOSIS — Z9849 Cataract extraction status, unspecified eye: Secondary | ICD-10-CM | POA: Diagnosis not present

## 2017-05-25 ENCOUNTER — Other Ambulatory Visit: Payer: Self-pay

## 2017-05-25 MED ORDER — LOSARTAN POTASSIUM-HCTZ 100-25 MG PO TABS: 1.0000 | ORAL_TABLET | Freq: Every day | ORAL | 2 refills | Status: DC

## 2017-05-30 ENCOUNTER — Ambulatory Visit: Payer: PPO | Admitting: Podiatry

## 2017-05-30 ENCOUNTER — Emergency Department (HOSPITAL_COMMUNITY): Payer: PPO

## 2017-05-30 ENCOUNTER — Encounter (HOSPITAL_COMMUNITY): Payer: Self-pay | Admitting: Emergency Medicine

## 2017-05-30 ENCOUNTER — Emergency Department (HOSPITAL_COMMUNITY)
Admission: EM | Admit: 2017-05-30 | Discharge: 2017-05-30 | Disposition: A | Discharge status: Home / Self Care | Payer: PPO | Attending: Emergency Medicine | Admitting: Emergency Medicine

## 2017-05-30 DIAGNOSIS — Z7984 Long term (current) use of oral hypoglycemic drugs: Secondary | ICD-10-CM | POA: Diagnosis not present

## 2017-05-30 DIAGNOSIS — I2510 Atherosclerotic heart disease of native coronary artery without angina pectoris: Secondary | ICD-10-CM | POA: Insufficient documentation

## 2017-05-30 DIAGNOSIS — L989 Disorder of the skin and subcutaneous tissue, unspecified: Secondary | ICD-10-CM | POA: Diagnosis not present

## 2017-05-30 DIAGNOSIS — B351 Tinea unguium: Secondary | ICD-10-CM

## 2017-05-30 DIAGNOSIS — R079 Chest pain, unspecified: Secondary | ICD-10-CM | POA: Diagnosis not present

## 2017-05-30 DIAGNOSIS — E119 Type 2 diabetes mellitus without complications: Secondary | ICD-10-CM | POA: Insufficient documentation

## 2017-05-30 DIAGNOSIS — M79609 Pain in unspecified limb: Secondary | ICD-10-CM

## 2017-05-30 DIAGNOSIS — J449 Chronic obstructive pulmonary disease, unspecified: Secondary | ICD-10-CM | POA: Insufficient documentation

## 2017-05-30 DIAGNOSIS — Z79899 Other long term (current) drug therapy: Secondary | ICD-10-CM | POA: Diagnosis not present

## 2017-05-30 DIAGNOSIS — N182 Chronic kidney disease, stage 2 (mild): Secondary | ICD-10-CM | POA: Insufficient documentation

## 2017-05-30 DIAGNOSIS — R072 Precordial pain: Secondary | ICD-10-CM | POA: Diagnosis not present

## 2017-05-30 DIAGNOSIS — Z87891 Personal history of nicotine dependence: Secondary | ICD-10-CM | POA: Diagnosis not present

## 2017-05-30 DIAGNOSIS — Z7982 Long term (current) use of aspirin: Secondary | ICD-10-CM | POA: Insufficient documentation

## 2017-05-30 DIAGNOSIS — I1310 Hypertensive heart and chronic kidney disease without heart failure, with stage 1 through stage 4 chronic kidney disease, or unspecified chronic kidney disease: Secondary | ICD-10-CM | POA: Insufficient documentation

## 2017-05-30 LAB — CBC
HCT: 34.3 % — ABNORMAL LOW (ref 39.0–52.0)
Hemoglobin: 10.6 g/dL — ABNORMAL LOW (ref 13.0–17.0)
MCH: 22.4 pg — ABNORMAL LOW (ref 26.0–34.0)
MCHC: 30.9 g/dL (ref 30.0–36.0)
MCV: 72.4 fL — ABNORMAL LOW (ref 78.0–100.0)
Platelets: 149 10*3/uL — ABNORMAL LOW (ref 150–400)
RBC: 4.74 MIL/uL (ref 4.22–5.81)
RDW: 16.6 % — ABNORMAL HIGH (ref 11.5–15.5)
WBC: 4.4 10*3/uL (ref 4.0–10.5)

## 2017-05-30 LAB — I-STAT TROPONIN, ED
Troponin i, poc: 0 ng/mL (ref 0.00–0.08)
Troponin i, poc: 0 ng/mL (ref 0.00–0.08)

## 2017-05-30 LAB — BASIC METABOLIC PANEL
Anion gap: 12 (ref 5–15)
BUN: 12 mg/dL (ref 6–20)
CO2: 27 mmol/L (ref 22–32)
Calcium: 9.3 mg/dL (ref 8.9–10.3)
Chloride: 100 mmol/L — ABNORMAL LOW (ref 101–111)
Creatinine, Ser: 1.28 mg/dL — ABNORMAL HIGH (ref 0.61–1.24)
GFR calc Af Amer: >60 mL/min (ref >60)
GFR calc non Af Amer: 52 mL/min — ABNORMAL LOW (ref >60)
Glucose, Bld: 122 mg/dL — ABNORMAL HIGH (ref 65–99)
Potassium: 3.2 mmol/L — ABNORMAL LOW (ref 3.5–5.1)
Sodium: 139 mmol/L (ref 135–145)

## 2017-05-30 MED ORDER — ISOSORBIDE MONONITRATE ER 30 MG PO TB24: 30.0000 mg | ORAL_TABLET | Freq: Every day | ORAL | 0 refills | Status: DC

## 2017-05-30 MED ORDER — TRAMADOL HCL 50 MG PO TABS
50.0000 mg | ORAL_TABLET | Freq: Once | ORAL | Status: AC
  Administered 2017-05-30: 50 mg via ORAL
  Filled 2017-05-30: qty 1

## 2017-05-30 NOTE — ED Notes (Addendum)
Pt ambulated to rm C20 from waiting room-- c/o shortness of breath and chest pain that started yesterday-- took a tramadol and NTG with relief at home, woke up at 2am with pain again-- not hurting at present.

## 2017-05-30 NOTE — ED Notes (Signed)
Patient verbalizes understanding of discharge instructions. Opportunity for questioning and answers were provided. Armband removed by staff, pt discharged from ED ambulatory.   

## 2017-05-30 NOTE — Patient Instructions (Signed)
Place 1/4 cup of epsom salts in a quart of warm tap water.  Submerge your foot or feet in the solution and soak for 20 minutes.  This soak should be done twice a day.  Next, remove your foot or feet from solution, blot dry the affected area. Apply ointment and cover if instructed by your doctor.   IF YOUR SKIN BECOMES IRRITATED WHILE USING THESE INSTRUCTIONS, IT IS OKAY TO SWITCH TO  WHITE VINEGAR AND WATER.  As another alternative soak, you may use antibacterial soap and water.  Monitor for any signs/symptoms of infection. Call the office immediately if any occur or go directly to the emergency room. Call with any questions/concerns.  Ingrown Toenail An ingrown toenail occurs when the corner or sides of your toenail grow into the surrounding skin. The big toe is most commonly affected, but it can happen to any of your toes. If your ingrown toenail is not treated, you will be at risk for infection. What are the causes? This condition may be caused by:  Wearing shoes that are too small or tight.  Injury or trauma, such as stubbing your toe or having your toe stepped on.  Improper cutting or care of your toenails.  Being born with (congenital) nail or foot abnormalities, such as having a nail that is too big for your toe.  What increases the risk? Risk factors for an ingrown toenail include:  Age. Your nails tend to thicken as you get older, so ingrown nails are more common in older people.  Diabetes.  Cutting your toenails incorrectly.  Blood circulation problems.  What are the signs or symptoms? Symptoms may include:  Pain, soreness, or tenderness.  Redness.  Swelling.  Hardening of the skin surrounding the toe.  Your ingrown toenail may be infected if there is fluid, pus, or drainage. How is this diagnosed? An ingrown toenail may be diagnosed by medical history and physical exam. If your toenail is infected, your health care provider may test a sample of the  drainage. How is this treated? Treatment depends on the severity of your ingrown toenail. Some ingrown toenails may be treated at home. More severe or infected ingrown toenails may require surgery to remove all or part of the nail. Infected ingrown toenails may also be treated with antibiotic medicines. Follow these instructions at home:  If you were prescribed an antibiotic medicine, finish all of it even if you start to feel better.  Soak your foot in warm soapy water for 20 minutes, 3 times per day or as directed by your health care provider.  Carefully lift the edge of the nail away from the sore skin by wedging a small piece of cotton under the corner of the nail. This may help with the pain. Be careful not to cause more injury to the area.  Wear shoes that fit well. If your ingrown toenail is causing you pain, try wearing sandals, if possible.  Trim your toenails regularly and carefully. Do not cut them in a curved shape. Cut your toenails straight across. This prevents injury to the skin at the corners of the toenail.  Keep your feet clean and dry.  If you are having trouble walking and are given crutches by your health care provider, use them as directed.  Do not pick at your toenail or try to remove it yourself.  Take medicines only as directed by your health care provider.  Keep all follow-up visits as directed by your health care provider. This   is important. Contact a health care provider if:  Your symptoms do not improve with treatment. Get help right away if:  You have red streaks that start at your foot and go up your leg.  You have a fever.  You have increased redness, swelling, or pain.  You have fluid, blood, or pus coming from your toenail. This information is not intended to replace advice given to you by your health care provider. Make sure you discuss any questions you have with your health care provider. Document Released: 03/26/2000 Document Revised:  08/29/2015 Document Reviewed: 02/20/2014 Elsevier Interactive Patient Education  2018 [employer]  

## 2017-05-30 NOTE — Discharge Instructions (Signed)

## 2017-05-30 NOTE — ED Triage Notes (Signed)
Pt arrived POV with c/o L sided non radiating CP onset after church yesterday, pt states pain continued intermittent, pt states pain woke him about 2am with shob, denies diaphoresis or nausea. Pt took SL nitro with some relief, pt took BP meds and ASA 81mg .

## 2017-05-30 NOTE — ED Provider Notes (Signed)
Emergency Department Provider Note   I have reviewed the triage vital signs and the nursing notes.   HISTORY  Chief Complaint Chest Pain   HPI Joel [name] is a 77 y.o. male with PMH of CAD with PCI in 2015 and intermediate nuclear stress test on 02/2017 followed by Dr. Angelena Form to the emergency department for evaluation of chest pain.  Symptoms began yesterday and have been intermittent since that time.  He is followed closely by cardiology as an outpatient and has been compliant with his medications.  When symptoms began yesterday he took tramadol along with nitroglycerin and his pain resolved.  The symptoms returned throughout the evening and have been ongoing with no exacerbating or alleviating factors.  Denies any dyspnea.  No diaphoresis.  No fevers, chills, productive cough.   Past Medical History:  Diagnosis Date  . Atypical chest pain   . CAD (coronary artery disease)    a. Multiple prior evaluations then 05/2012 s/p BMS to OM2. b. Nuc 11/2012 wnl. c. Cath 2015: patent stent, moderate LAD/RCA dz, treated medically.  . Cancer ([place])    Hx: of prostate  . Cataract   . CKD (chronic kidney disease), stage II   . COPD (chronic obstructive pulmonary disease) ([place])   . Cough secondary to angiotensin converting enzyme inhibitor (ACE-I)   . Diabetes mellitus without complication ([place])   . Diverticulosis   . DVT (deep venous thrombosis) ([place])   . GERD (gastroesophageal reflux disease)   . Headache(784.0)    Hx: of  . History of blood transfusion    "I've had 2; don't know what it was related to" (11/09/2012)  . HLD (hyperlipidemia)   . HTN (hypertension)   . Iron deficiency anemia   . Microcytic anemia   . Peripheral vascular disease ([place])    a. L pop-tibial bypass 2011, followed by [facility].  . Rotator cuff injury    "left arm; never repaired" (11/09/2012)    Patient Active Problem List   Diagnosis Date Noted  . Elliptocytosis on peripheral blood smear ([place]) 09/13/2016    . Prediabetes 03/22/2016  . PVD (peripheral vascular disease) with claudication ([place]) 11/15/2012  . Chest pain, localized 11/10/2012  . Hypokalemia 11/10/2012  . Bradycardia 11/09/2012  . Thrombocytopenia ([place]) 06/01/2012  . Microcytic anemia   . Atherosclerosis of native arteries of the extremities with intermittent claudication 06/16/2011  . Stable angina ([place]) 06/11/2011  . CAD (coronary artery disease)   . Chest pain, atypical 05/30/2011  . Iron deficiency anemia 10/03/2009  . DIVERTICULOSIS OF COLON 10/03/2009  . WEIGHT LOSS, ABNORMAL 10/03/2009  . CHEST PAIN UNSPECIFIED 09/13/2008  . Headache(784.0) 08/19/2008  . TIA 06/05/2008  . SHOULDER PAIN, LEFT 10/11/2007  . COUGH DUE TO ACE INHIBITORS 05/07/2007  . History of tobacco use disorder 04/25/2007  . Hyperlipidemia 04/21/2007  . Essential hypertension 04/21/2007  . GERD 04/21/2007    Past Surgical History:  Procedure Laterality Date  . CARDIOVASCULAR STRESS TEST  Aug. 2014  . CATARACT EXTRACTION W/ INTRAOCULAR LENS IMPLANT Right ~ 2008  . CIRCUMCISION    . COLONOSCOPY    . CORONARY ANGIOPLASTY WITH STENT PLACEMENT  2014   "1" (11/09/2012)  . ILIAC ARTERY ANEURYSM REPAIR    . LEFT HEART CATH  05-31-12  . LEFT HEART CATHETERIZATION WITH CORONARY ANGIOGRAM N/A 06/01/2011   Procedure: LEFT HEART CATHETERIZATION WITH CORONARY ANGIOGRAM;  Surgeon: Hillary Bow, MD;  Location[facility] CATH LAB;  Service: Cardiovascular;  Laterality: N/A;  . LEFT  HEART CATHETERIZATION WITH CORONARY ANGIOGRAM N/A 05/31/2012   Procedure: LEFT HEART CATHETERIZATION WITH CORONARY ANGIOGRAM;  Surgeon: Peter M Martinique, MD;  Location: [facility] CATH LAB;  Service: Cardiovascular;  Laterality: N/A;  . LEFT HEART CATHETERIZATION WITH CORONARY ANGIOGRAM N/A 08/01/2013   Procedure: LEFT HEART CATHETERIZATION WITH CORONARY ANGIOGRAM;  Surgeon: Burnell Blanks, MD;  Location: [facility] CATH LAB;  Service: Cardiovascular;  Laterality: N/A;  . PERCUTANEOUS CORONARY STENT  INTERVENTION (PCI-S)  05/31/2012   Procedure: PERCUTANEOUS CORONARY STENT INTERVENTION (PCI-S);  Surgeon: Peter M Martinique, MD;  Location: [facility] CATH LAB;  Service: Cardiovascular;;  . PR VEIN BYPASS GRAFT,AORTO-FEM-POP Left 10/01/2009   left below knee popliteal artery to posterior tibial artery  . SHOULDER ARTHROSCOPY WITH OPEN ROTATOR CUFF REPAIR AND DISTAL CLAVICLE ACROMINECTOMY Left 01/12/2013   Procedure: LEFT SHOULDER ARTHROSCOPY WITH DEBRIDEMENT OPEN DISTAL CLAVICLE RESECTION ,acromioplastyAND ROTATOR CUFF REPAIR;  Surgeon: Yvette Rack., MD;  Location: [place];  Service: Orthopedics;  Laterality: Left;    Current Outpatient Rx  . Order #: [id number] Class: Historical Med  . Order #: [id number] Class: Historical Med  . Order #: [id number] Class: Normal  . Order #: [id number] Class: Historical Med  . Order #: [id number] Class: Print  . Order #: [id number] Class: Historical Med  . Order #: [id number] Class: Historical Med  . Order #: [id number] Class: Historical Med  . Order #: [id number] Class: Print  . Order #: [id number] Class: Print  . Order #: [id number] Class: Normal  . Order #: [id number] Class: Historical Med  . Order #: [id number] Class: Normal  . Order #: [id number] Class: Historical Med  . Order #: [id number] Class: Print  . Order #: [id number] Class: Historical Med  . Order #: [id number] Class: Historical Med  . Order #: [id number] Class: Print    Allergies Atenolol; Imdur [isosorbide mononitrate]; Lisinopril; Topiramate; and Tramadol-acetaminophen  Family History  Problem Relation Age of Onset  . Breast cancer Mother        deceased  . Hyperlipidemia Mother   . Hypertension Mother   . COPD Father        deceased  . Cancer Father   . Hyperlipidemia Father   . Hypertension Father   . Hypertension Sister   . Cancer Brother   . Hypertension Brother   . Hypertension Daughter   . Hypertension Son   . Colon cancer Neg Hx        pt is unclear about family hx    Social History Social History    Tobacco Use  . Smoking status: Former Smoker    Packs/day: 0.00    Types: Cigarettes    Last attempt to quit: 05/26/2011    Years since quitting: 6.0  . Smokeless tobacco: Never Used  Substance Use Topics  . Alcohol use: No  . Drug use: No    Review of Systems  Constitutional: No fever/chills Eyes: No visual changes. ENT: No sore throat. Cardiovascular: Positive chest pain. Respiratory: Denies shortness of breath. Gastrointestinal: No abdominal pain.  No nausea, no vomiting.  No diarrhea.  No constipation. Genitourinary: Negative for dysuria. Musculoskeletal: Negative for back pain. Skin: Negative for rash. Neurological: Negative for headaches, focal weakness or numbness.  10-point ROS otherwise negative.  ____________________________________________   PHYSICAL EXAM:  VITAL SIGNS: ED Triage Vitals  Enc Vitals Group     BP 05/30/17 0321 (!) 144/72     Pulse Rate 05/30/17 0321 72     Resp 05/30/17 0321 18     Temp 05/30/17 0321 (!) 97.5 F (36.4 C)     Temp Source 05/30/17 0321  Oral     SpO2 05/30/17 0321 99 %     Weight 05/30/17 0322 226 lb (102.5 kg)     Height 05/30/17 0322 5\' 10"  (1.778 m)     Pain Score 05/30/17 0322 6   Constitutional: Alert and oriented. Well appearing and in no acute distress. Eyes: Conjunctivae are normal.  Head: Atraumatic. Nose: No congestion/rhinnorhea. Mouth/Throat: Mucous membranes are moist.  Neck: No stridor.  Cardiovascular: Normal rate, regular rhythm. Good peripheral circulation. Grossly normal heart sounds.   Respiratory: Normal respiratory effort.  No retractions. Lungs CTAB. Gastrointestinal: Soft and nontender. No distention.  Musculoskeletal: No lower extremity tenderness nor edema. No gross deformities of extremities. Neurologic:  Normal speech and language. No gross focal neurologic deficits are appreciated.  Skin:  Skin is warm, dry and intact. No rash noted.  ____________________________________________    LABS (all labs ordered are listed, but only abnormal results are displayed)  Labs Reviewed  BASIC METABOLIC PANEL - Abnormal; Notable for the following components:      Result Value   Potassium 3.2 (*)    Chloride 100 (*)    Glucose, Bld 122 (*)    Creatinine, Ser 1.28 (*)    GFR calc non Af Amer 52 (*)    All other components within normal limits  CBC - Abnormal; Notable for the following components:   Hemoglobin 10.6 (*)    HCT 34.3 (*)    MCV 72.4 (*)    MCH 22.4 (*)    RDW 16.6 (*)    Platelets 149 (*)    All other components within normal limits  I-STAT TROPONIN, ED  I-STAT TROPONIN, ED   ____________________________________________  EKG   EKG Interpretation  Date/Time:  Monday May 30 2017 03:11:39 EST Ventricular Rate:  75 PR Interval:  242 QRS Duration: 96 QT Interval:  402 QTC Calculation: 448 R Axis:   29 Text Interpretation:  Sinus rhythm with 1st degree A-V block Otherwise normal ECG No STEMI.  Confirmed by Nanda Quinton [id number]) on 05/30/2017 9:55:18 AM       ____________________________________________  RADIOLOGY  Dg Chest 2 View  Result Date: 05/30/2017 CLINICAL DATA:  Chest pain EXAM: CHEST  2 VIEW COMPARISON:  Chest radiograph 04/20/2017 FINDINGS: Bibasilar atelectasis. Lungs otherwise clear. No pulmonary edema. No pleural effusion or pneumothorax. Unchanged cardiomediastinal contours. IMPRESSION: Bibasilar atelectasis. Electronically Signed   By: Ulyses Jarred M.D.   On: 05/30/2017 03:53    ____________________________________________   PROCEDURES  Procedure(s) performed:   Procedures  None ____________________________________________   INITIAL IMPRESSION / ASSESSMENT AND PLAN / ED COURSE  Pertinent labs & imaging results that were available during my care of the patient were reviewed by me and considered in my medical decision making (see chart for details).  Patient presents to the emergency department for evaluation of chest  pain.  It has been intermittent over the past 24 hours.  In review of his most recent cardiology note he has known coronary artery disease with abnormal stress test and November 2018.  At that time they felt his chest pain was very atypical and improved with tramadol.  Seems to be a similar presentation here today.  He has 2- troponins after waiting to be evaluated in the ED. EKG not changed significantly.  Reviewed labs and x-rays with no significant abnormalities.  Note from January mentions possibly starting Imdur.  Given his significant CAD risk recent abnormal stress test I will discuss the case with the cardiologist on-call to decide if  additional testing is required on an emergent basis or if he can follow-up with the cardiologist as an outpatient, possibly starting Imdur at discharge.   10:50 AM Spoke with Dr. Cathie Olden by phone regarding the case. Agrees with plan for Imdur 30 mg daily and close Cardiology follow up.   At this time, I do not feel there is any life-threatening condition present. I have reviewed and discussed all results (EKG, imaging, lab, urine as appropriate), exam findings with patient. I have reviewed nursing notes and appropriate previous records.  I feel the patient is safe to be discharged home without further emergent workup. Discussed usual and customary return precautions. Patient and family (if present) verbalize understanding and are comfortable with this plan.  Patient will follow-up with their primary care provider. If they do not have a primary care provider, information for follow-up has been provided to them. All questions have been answered.  ____________________________________________  FINAL CLINICAL IMPRESSION(S) / ED DIAGNOSES  Final diagnoses:  Precordial chest pain    NEW OUTPATIENT MEDICATIONS STARTED DURING THIS VISIT:  New Prescriptions   ISOSORBIDE MONONITRATE (IMDUR) 30 MG 24 HR TABLET    Take 1 tablet (30 mg total) by mouth daily.    Note:   This document was prepared using Dragon voice recognition software and may include unintentional dictation errors.  Nanda Quinton, MD Emergency Medicine    Anayi Bricco, [facility], MD 05/30/17 [phone]

## 2017-05-30 NOTE — ED Notes (Signed)
Pt requesting to receive tramadol before he is discharged home. MD aware, orders received

## 2017-06-01 NOTE — Progress Notes (Signed)
   SUBJECTIVE Patient with a history of diabetes mellitus presents to office today complaining of an elongated, thickened left third toenail that causes pain while ambulating in shoes. Patient is unable to trim the nail on his own. He also reports a painful callus lesion to the same toe. He has not done anything to treat they symptoms. Applying pressure to the area increases the pain. Patient is here for further evaluation and treatment.   Past Medical History:  Diagnosis Date  . Atypical chest pain   . CAD (coronary artery disease)    a. Multiple prior evaluations then 05/2012 s/p BMS to OM2. b. Nuc 11/2012 wnl. c. Cath 2015: patent stent, moderate LAD/RCA dz, treated medically.  . Cancer ([place])    Hx: of prostate  . Cataract   . CKD (chronic kidney disease), stage II   . COPD (chronic obstructive pulmonary disease) ([place])   . Cough secondary to angiotensin converting enzyme inhibitor (ACE-I)   . Diabetes mellitus without complication ([place])   . Diverticulosis   . DVT (deep venous thrombosis) ([place])   . GERD (gastroesophageal reflux disease)   . Headache(784.0)    Hx: of  . History of blood transfusion    "I've had 2; don't know what it was related to" (11/09/2012)  . HLD (hyperlipidemia)   . HTN (hypertension)   . Iron deficiency anemia   . Microcytic anemia   . Peripheral vascular disease ([place])    a. L pop-tibial bypass 2011, followed by [facility].  . Rotator cuff injury    "left arm; never repaired" (11/09/2012)     OBJECTIVE General Patient is awake, alert, and oriented x 3 and in no acute distress. Derm Skin is dry and supple bilateral. Hyperkeratotic lesion present on the left third toe. Pain on palpation with a central nucleated core noted. Left third toenail is tender, long, thickened and dystrophic with subungual debris, consistent with onychomycosis. No signs of infection noted. Vasc  DP and PT pedal pulses palpable bilaterally. Temperature gradient within normal limits.  Neuro  Epicritic and protective threshold sensation diminished bilaterally.  Musculoskeletal Exam No symptomatic pedal deformities noted bilateral. Muscular strength within normal limits.  ASSESSMENT 1. Diabetes Mellitus w/ peripheral neuropathy 2. Onychomycosis of left third toe nail due to dermatophyte  3. Pre-ulcerative callus lesion left third toe  PLAN OF CARE 1. Patient evaluated today. 2. Instructed to maintain good pedal hygiene and foot care. Stressed importance of controlling blood sugar.  3. Mechanical debridement of left third toenail performed using a nail nipper. Filed with dremel without incident.  4. Excisional debridement of keratoic lesion using a chisel blade was performed without incident. Light dressing applied. 5. Recommended good shoe gear.  6. Return to clinic as needed.     Edrick Kins, DPM [facility]  Dr. Edrick Kins, [address]                                        [place], [place] [address]                Office [phone]  Fax [phone]

## 2017-06-02 ENCOUNTER — Encounter: Payer: Self-pay | Admitting: Family Medicine

## 2017-06-02 ENCOUNTER — Ambulatory Visit (INDEPENDENT_AMBULATORY_CARE_PROVIDER_SITE_OTHER): Payer: PPO | Admitting: Family Medicine

## 2017-06-02 VITALS — BP 118/62 | HR 78 | Wt 238.0 lb

## 2017-06-02 DIAGNOSIS — Z09 Encounter for follow-up examination after completed treatment for conditions other than malignant neoplasm: Secondary | ICD-10-CM

## 2017-06-02 DIAGNOSIS — R0789 Other chest pain: Secondary | ICD-10-CM | POA: Diagnosis not present

## 2017-06-02 NOTE — Progress Notes (Signed)
   Subjective:    [name], male    DOB: 16-May-1940, 77 y.o.   MRN: [id number]  HPI Chief Complaint  Patient presents with  . ER Follow-up    Er Follow-up on chest pain   He is here to follow up on recent ED visit for chest pain. The pain was left sided, non exertional and had occurred 2 previous times in the past several months. He was not found to have an acute cardiac event. Denies chest pain or any symptoms since being discharged home from the ED. States he feels back to his usual state of health.  He has been closely followed by his cardiologist with a recent stress test.  He has a cardiology appointment on Monday.   Denies fever, chills, dizziness, chest pain, palpitations, shortness of breath, abdominal pain, N/V/D, LE edema.   He is also a patient at the [place].  States he was told his prostate cancer is improving and he is optimistic about this.   No other concerns today.   Reviewed allergies, medications, past medical, surgical, family, and social history.   Review of Systems Pertinent positives and negatives in the history of present illness.     Objective:   Physical Exam BP 118/62   Pulse 78   Wt 238 lb (108 kg)   BMI 34.15 kg/m   Alert and in no distress. Tympanic membranes and canals are normal. Pharyngeal area is normal. Neck is supple without adenopathy or thyromegaly. Cardiac exam shows a regular sinus rhythm without murmurs or gallops. Lungs are clear to auscultation.      Assessment & Plan:  Follow up  Intermittent left-sided chest pain  Reviewed ED notes, labs and imaging as well as his recent stress test which showed "intermediate risk".  Apparently a cardiac etiology was ruled out for recent episode of chest pain.  Discussed other possible differentials including GERD, MSK. No GERD symptoms. No chest pain since ED discharge to home.  He is doing fine on the new medication Imdur started by the EDP.  He will follow up with his  cardiologist on Monday and return to see me in the next 2 months for a medicare wellness visit and fasting CPE.

## 2017-06-04 ENCOUNTER — Other Ambulatory Visit: Payer: Self-pay | Admitting: Cardiovascular Disease

## 2017-06-06 ENCOUNTER — Ambulatory Visit: Payer: PPO | Admitting: Physician Assistant

## 2017-06-06 ENCOUNTER — Encounter: Payer: Self-pay | Admitting: Physician Assistant

## 2017-06-06 VITALS — BP 130/60 | HR 69 | Ht 70.0 in | Wt 240.0 lb

## 2017-06-06 DIAGNOSIS — R7303 Prediabetes: Secondary | ICD-10-CM | POA: Diagnosis not present

## 2017-06-06 DIAGNOSIS — I10 Essential (primary) hypertension: Secondary | ICD-10-CM | POA: Diagnosis not present

## 2017-06-06 DIAGNOSIS — I2510 Atherosclerotic heart disease of native coronary artery without angina pectoris: Secondary | ICD-10-CM | POA: Diagnosis not present

## 2017-06-06 DIAGNOSIS — E782 Mixed hyperlipidemia: Secondary | ICD-10-CM

## 2017-06-06 MED ORDER — LOSARTAN POTASSIUM-HCTZ 100-25 MG PO TABS: 1.0000 | ORAL_TABLET | Freq: Every day | ORAL | 11 refills | Status: DC

## 2017-06-06 NOTE — Patient Instructions (Addendum)
Medication Instructions:  Your physician recommends that you continue on your current medications as directed. Please refer to the Current Medication list given to you today.   Labwork: 2 WEEKS:  06/20/17 BMET  Testing/Procedures: None ordered  Follow-Up: Your physician recommends that you schedule a follow-up appointment in: [place]   Any Other Special Instructions Will Be Listed Below (If Applicable).     If you need a refill on your cardiac medications before your next appointment, please call your pharmacy.

## 2017-06-06 NOTE — Progress Notes (Signed)
Cardiology Office Note    Date:  06/06/2017   ID:  [name], [name] 1940-05-06, MRN [id number]  PCP:  Girtha Rm, NP-C  Cardiologist: Lauree Chandler, MD  Chief Complaint  Patient presents with  . Follow-up    History of Present Illness:  Joel [name] is a 77 y.o. male with history of HTN, HLD, CAD, PAD, DM.Cardiac cath February 2014 with moderate LAD and RCA disease, severe OM2 stenosis treated with bare metal stent. Admitted to [facility] 11/09/12 with chest pain. Ruled out for MI. Lexiscan stress myoview 11/16/12 without ischemia. Normal LVEF.  Cardiac cath 08/01/13 with patent stent Circumflex and mild to moderate disease in the LAD and RCA. His PAD is followed in [facility] by Dr. Scot Dock. Seen in the ED by Dr. Ron Parker 12/15/13 with c/o chest pain. Negative troponin and discharged home. He was seen here 03/04/14 and had c/o chest pain after working on his car but quickly resolved. He was seen in the ED 09/10/14 with atypical chest pain. Troponin was negative x 2 and EKG was unchanged.    Last saw Dr.McAlhany 04/2015 with chronic chest pain that did not sound cardiac. Most likely GERD versus musculoskeletal pain. Medical management recommended.   The patient was in the emergency room with chest pain 06/2015 07/2015 and most recently 09/2015 at which time troponin was negative and EKG and chest x-ray unremarkable.   Patient saw Ellen Henri, PA-C 03/01/17 and he was complaining of more chest pain.  Nuclear stress test 03/10/17 showed EF 55% with moderate inferior defect that partially improves consistent with ischemia and possible soft tissue attenuation.  Small region of anterior anterior septal ischemia.  Read as an intermediate study.  I saw the patient 04/19/17 and overall he was doing well.  In the ED 05/30/17 with recurrent chest pain.  Potassium 3.2 troponins negative EKG without acute change.  Imdur 30 mg daily added. Patient's potassium has been low on 2 labs at 3.2 and 3.3.  Overall doing well.  Only has tingling chest pain at times.  No chest tightness or pressure.  Works on cars regularly and stays active.  Has been diagnosed with prostate cancer recently and is on Zytiga.    Past Medical History:  Diagnosis Date  . Atypical chest pain   . CAD (coronary artery disease)    a. Multiple prior evaluations then 05/2012 s/p BMS to OM2. b. Nuc 11/2012 wnl. c. Cath 2015: patent stent, moderate LAD/RCA dz, treated medically.  . Cancer ([place])    Hx: of prostate  . Cataract   . CKD (chronic kidney disease), stage II   . COPD (chronic obstructive pulmonary disease) ([place])   . Cough secondary to angiotensin converting enzyme inhibitor (ACE-I)   . Diabetes mellitus without complication ([place])   . Diverticulosis   . DVT (deep venous thrombosis) ([place])   . GERD (gastroesophageal reflux disease)   . Headache(784.0)    Hx: of  . History of blood transfusion    "I've had 2; don't know what it was related to" (11/09/2012)  . HLD (hyperlipidemia)   . HTN (hypertension)   . Iron deficiency anemia   . Microcytic anemia   . Peripheral vascular disease ([place])    a. L pop-tibial bypass 2011, followed by [facility].  . Rotator cuff injury    "left arm; never repaired" (11/09/2012)    Past Surgical History:  Procedure Laterality Date  . CARDIOVASCULAR STRESS TEST  Aug. 2014  . CATARACT EXTRACTION W/ INTRAOCULAR  LENS IMPLANT Right ~ 2008  . CIRCUMCISION    . COLONOSCOPY    . CORONARY ANGIOPLASTY WITH STENT PLACEMENT  2014   "1" (11/09/2012)  . ILIAC ARTERY ANEURYSM REPAIR    . LEFT HEART CATH  05-31-12  . LEFT HEART CATHETERIZATION WITH CORONARY ANGIOGRAM N/A 06/01/2011   Procedure: LEFT HEART CATHETERIZATION WITH CORONARY ANGIOGRAM;  Surgeon: Hillary Bow, MD;  Location: [facility] CATH LAB;  Service: Cardiovascular;  Laterality: N/A;  . LEFT HEART CATHETERIZATION WITH CORONARY ANGIOGRAM N/A 05/31/2012   Procedure: LEFT HEART CATHETERIZATION WITH CORONARY ANGIOGRAM;  Surgeon: Peter M  Martinique, MD;  Location: [facility] CATH LAB;  Service: Cardiovascular;  Laterality: N/A;  . LEFT HEART CATHETERIZATION WITH CORONARY ANGIOGRAM N/A 08/01/2013   Procedure: LEFT HEART CATHETERIZATION WITH CORONARY ANGIOGRAM;  Surgeon: Burnell Blanks, MD;  Location: [facility] CATH LAB;  Service: Cardiovascular;  Laterality: N/A;  . PERCUTANEOUS CORONARY STENT INTERVENTION (PCI-S)  05/31/2012   Procedure: PERCUTANEOUS CORONARY STENT INTERVENTION (PCI-S);  Surgeon: Peter M Martinique, MD;  Location: [facility] CATH LAB;  Service: Cardiovascular;;  . PR VEIN BYPASS GRAFT,AORTO-FEM-POP Left 10/01/2009   left below knee popliteal artery to posterior tibial artery  . SHOULDER ARTHROSCOPY WITH OPEN ROTATOR CUFF REPAIR AND DISTAL CLAVICLE ACROMINECTOMY Left 01/12/2013   Procedure: LEFT SHOULDER ARTHROSCOPY WITH DEBRIDEMENT OPEN DISTAL CLAVICLE RESECTION ,acromioplastyAND ROTATOR CUFF REPAIR;  Surgeon: Yvette Rack., MD;  Location: [place];  Service: Orthopedics;  Laterality: Left;    Current Medications: Current Meds  Medication Sig  . abiraterone Acetate (ZYTIGA) 250 MG tablet Take 1,000 mg by mouth daily. Take on an empty stomach 1 hour before or 2 hours after a meal  . albuterol (PROVENTIL HFA;VENTOLIN HFA) 108 (90 Base) MCG/ACT inhaler Inhale 2 puffs every 4 (four) hours as needed into the lungs for wheezing or shortness of breath.  Marland Kitchen amLODipine (NORVASC) 10 MG tablet Take 1 tablet (10 mg total) by mouth daily.  Marland Kitchen aspirin EC 81 MG tablet Take 81 mg by mouth daily.   . ferrous sulfate 325 (65 FE) MG tablet Take 325 mg by mouth daily with breakfast.  . gabapentin (NEURONTIN) 100 MG capsule TAKE 1 CAPSULE BY MOUTH AS NEEDED FOR HEADACHE PAIN. MAY REPEAT AFTER 2 HOURS IF NEEDED  . gabapentin (NEURONTIN) 600 MG tablet Take 600 mg by mouth at bedtime.  . Ipratropium-Albuterol (COMBIVENT RESPIMAT) 20-100 MCG/ACT AERS respimat Inhale 1 puff into the lungs every 6 (six) hours.  . isosorbide mononitrate (IMDUR) 30 MG 24 hr tablet Take  1 tablet (30 mg total) by mouth daily.  Marland Kitchen losartan-hydrochlorothiazide (HYZAAR) 100-25 MG tablet Take 1 tablet by mouth daily.  . metFORMIN (GLUCOPHAGE) 500 MG tablet Take 500 mg by mouth 2 (two) times daily with a meal. Reported on 08/27/2015  . nitroGLYCERIN (NITROSTAT) 0.4 MG SL tablet Place 1 tablet (0.4 mg total) under the tongue every 5 (five) minutes as needed for chest pain.  Marland Kitchen omeprazole (PRILOSEC) 20 MG capsule Take 20 mg by mouth 2 (two) times daily before a meal.  . predniSONE (DELTASONE) 20 MG tablet 3 tabs po day one, then 2 po daily x 4 days  . tamsulosin (FLOMAX) 0.4 MG CAPS capsule Take 0.4 mg at bedtime by mouth.  . traMADol (ULTRAM) 50 MG tablet take 1 tablet by mouth every 6 hours if needed FOR PAIN  . [DISCONTINUED] losartan-hydrochlorothiazide (HYZAAR) 100-25 MG tablet Take 1 tablet by mouth daily.   Current Facility-Administered Medications for the 06/06/17 encounter (Office Visit) with  Imogene Burn, PA-C  Medication  . 0.9 %  sodium chloride infusion     Allergies:   Atenolol; Imdur [isosorbide mononitrate]; Lisinopril; Topiramate; and Tramadol-acetaminophen   Social History   Socioeconomic History  . Marital status: Widowed    Spouse name: None  . Number of children: None  . Years of education: None  . Highest education level: None  Social Needs  . Financial resource strain: None  . Food insecurity - worry: None  . Food insecurity - inability: None  . Transportation needs - medical: None  . Transportation needs - non-medical: None  Occupational History  . Occupation: Retired  Tobacco Use  . Smoking status: Former Smoker    Packs/day: 0.00    Types: Cigarettes    Last attempt to quit: 05/26/2011    Years since quitting: 6.0  . Smokeless tobacco: Never Used  Substance and Sexual Activity  . Alcohol use: No  . Drug use: No  . Sexual activity: None  Other Topics Concern  . None  Social History Narrative   Single. Retired. Still works on Conservation officer, historic buildings.  Lives in [place] by himself.     Family History:  The patient's family history includes Breast cancer in his mother; COPD in his father; Cancer in his brother and father; Hyperlipidemia in his father and mother; Hypertension in his brother, daughter, father, mother, sister, and son.   ROS:   Please see the history of present illness.    Review of Systems  Constitution: Negative.  HENT: Negative.   Cardiovascular: Positive for chest pain.  Respiratory: Negative.   Endocrine: Negative.  Negative for cold intolerance.  Hematologic/Lymphatic: Negative.   Musculoskeletal: Negative.   Gastrointestinal: Negative.   Genitourinary: Positive for frequency and hesitancy.  Neurological: Negative.    All other systems reviewed and are negative.   PHYSICAL EXAM:   VS:  BP 130/60   Pulse 69   Ht '5\' 10"'$  (1.778 m)   Wt 240 lb (108.9 kg)   SpO2 94%   BMI 34.44 kg/m   Physical Exam  GEN: Well nourished, well developed, in no acute distress  Neck: no JVD, carotid bruits, or masses Cardiac:RRR; no murmurs, rubs, or gallops  Respiratory:  clear to auscultation bilaterally, normal work of breathing GI: soft, nontender, nondistended, + BS Ext: without cyanosis, clubbing, or edema, Good distal pulses bilaterally Neuro:  Alert and Oriented x 3,  Psych: euthymic mood, full affect  Wt Readings from Last 3 Encounters:  06/06/17 240 lb (108.9 kg)  06/02/17 238 lb (108 kg)  05/30/17 226 lb (102.5 kg)      Studies/Labs Reviewed:   EKG:  EKG is not ordered today.   Recent Labs: 11/12/2016: ALT 13 05/30/2017: BUN 12; Creatinine, Ser 1.28; Hemoglobin 10.6; Platelets 149; Potassium 3.2; Sodium 139   Lipid Panel    Component Value Date/Time   CHOL 164 02/06/2013 0741   TRIG 122.0 02/06/2013 0741   HDL 34.30 (L) 02/06/2013 0741   CHOLHDL 5 02/06/2013 0741   VLDL 24.4 02/06/2013 0741   LDLCALC 105 (H) 02/06/2013 0741    Additional studies/ records that were reviewed today include:    Nuclear stress test 11/29/18Study Highlights     Nuclear stress EF: 55%.  Moderate sided inferior defect that partially improves consistent with ischemia and possible soft tissue attenuation. Small region of anterior/anteroseptal ischemia. (base)  This is an intermediate risk study.          Cath 07/2013 Angiographic Findings:  Left main: No obstructive disease.     Left Anterior Descending Artery: Large caliber vessel that courses to the apex. The proximal and mid vessel is ectatic. There is diffuse 30-40% stenosis in the mid vessel. There are mild luminal irregularities in the distal vessel.     Circumflex Artery: Large caliber vessel with two obtuse marginal branches. The first OM branch is small in caliber and is chronically occluded in the mid vessel. The second OM branch is patent with patent stent, minimal restenosis. The distal segment of the OM branch has diffuse plaque.     Right Coronary Artery: Large caliber dominant vessel with proximal Shepherd's crook. The proximal vessel has diffuse 40% stenosis. The mid vessel is ectatic with 40% stenosis. The distal vessel has diffuse 50% stenosis. The PDA has 50% proximal stenosis followed by diffuse plaque.    Left Ventricular Angiogram: LVEF=65%.    Impression: 1. Triple vessel CAD with patent stent in the second OM branch.   2. Moderate non-obstructive disease in the LAD and RCA 3. Normal LV function   Recommendations: Continue medical management of CAD.                  ASSESSMENT:    1. Coronary artery disease involving native coronary artery of native heart without angina pectoris   2. Essential hypertension   3. Mixed hyperlipidemia   4. Prediabetes      PLAN:  In order of problems listed above:  CAD last cath 2015 showed moderate LAD and RCA disease and patent circumflex stent.  Recurrent ER visits with chest pain enzymes negative EKG is without change.  Lexiscan Myoview 02/2017 moderate sized inferior  defect partially improves consistent with ischemia and possible soft tissue attenuation felt to be intermediate risk.  EF 55%.  Symptoms improved with Imdur.  Patient's chest pain has been atypical.  Seen in June.  Hypertension well-controlled potassium was low when he was in the ER in January and February.  Patient would like to try a high potassium diet rather than taking another pill.  We have given him a diet to follow and will repeat a be met in 2 weeks.  [facility]  Diabetes mellitus well controlled on metformin    Medication Adjustments/Labs and Tests Ordered: Current medicines are reviewed at length with the patient today.  Concerns regarding medicines are outlined above.  Medication changes, Labs and Tests ordered today are listed in the Patient Instructions below. Patient Instructions  Medication Instructions:  Your physician recommends that you continue on your current medications as directed. Please refer to the Current Medication list given to you today.   Labwork: 2 WEEKS:  06/20/17 BMET  Testing/Procedures: None ordered  Follow-Up: Your physician recommends that you schedule a follow-up appointment in: [place]   Any Other Special Instructions Will Be Listed Below (If Applicable).     If you need a refill on your cardiac medications before your next appointment, please call your pharmacy.      Signed, Ermalinda Barrios, PA-C  06/06/2017 1:31 PM    [facility] [address], [place], [place]  [address] Phone: [phone]; Fax: [phone]

## 2017-06-13 DIAGNOSIS — Z9849 Cataract extraction status, unspecified eye: Secondary | ICD-10-CM | POA: Diagnosis not present

## 2017-06-13 DIAGNOSIS — H04123 Dry eye syndrome of bilateral lacrimal glands: Secondary | ICD-10-CM | POA: Diagnosis not present

## 2017-06-13 DIAGNOSIS — H18413 Arcus senilis, bilateral: Secondary | ICD-10-CM | POA: Diagnosis not present

## 2017-06-13 DIAGNOSIS — H11423 Conjunctival edema, bilateral: Secondary | ICD-10-CM | POA: Diagnosis not present

## 2017-06-13 DIAGNOSIS — Z961 Presence of intraocular lens: Secondary | ICD-10-CM | POA: Diagnosis not present

## 2017-06-13 DIAGNOSIS — H11153 Pinguecula, bilateral: Secondary | ICD-10-CM | POA: Diagnosis not present

## 2017-06-13 DIAGNOSIS — H401211 Low-tension glaucoma, right eye, mild stage: Secondary | ICD-10-CM | POA: Diagnosis not present

## 2017-06-13 DIAGNOSIS — H47231 Glaucomatous optic atrophy, right eye: Secondary | ICD-10-CM | POA: Diagnosis not present

## 2017-06-20 ENCOUNTER — Other Ambulatory Visit: Payer: PPO | Admitting: *Deleted

## 2017-06-20 DIAGNOSIS — I10 Essential (primary) hypertension: Secondary | ICD-10-CM | POA: Diagnosis not present

## 2017-06-21 LAB — BASIC METABOLIC PANEL
BUN/Creatinine Ratio: 11 (ref 10–24)
BUN: 12 mg/dL (ref 8–27)
CO2: 25 mmol/L (ref 20–29)
Calcium: 9.7 mg/dL (ref 8.6–10.2)
Chloride: 102 mmol/L (ref 96–106)
Creatinine, Ser: 1.1 mg/dL (ref 0.76–1.27)
GFR calc Af Amer: 74 mL/min/{1.73_m2} (ref >59)
GFR calc non Af Amer: 64 mL/min/{1.73_m2} (ref >59)
Glucose: 112 mg/dL — ABNORMAL HIGH (ref 65–99)
Potassium: 3.9 mmol/L (ref 3.5–5.2)
Sodium: 142 mmol/L (ref 134–144)

## 2017-06-21 NOTE — Progress Notes (Signed)
Pt has been made aware of normal result and verbalized understanding.  jw 06/21/17

## 2017-06-28 ENCOUNTER — Other Ambulatory Visit: Payer: Self-pay | Admitting: Family Medicine

## 2017-06-28 ENCOUNTER — Telehealth: Payer: Self-pay | Admitting: Family Medicine

## 2017-06-28 MED ORDER — TRAMADOL HCL 50 MG PO TABS: ORAL_TABLET | ORAL | 0 refills | Status: DC

## 2017-06-28 NOTE — Telephone Encounter (Signed)
Pt called and is requesting a refill on his tramadol pt uses [facility] [place], [address] pt can be reached at [phone]

## 2017-07-01 LAB — HM COLONOSCOPY

## 2017-07-14 ENCOUNTER — Emergency Department (HOSPITAL_COMMUNITY)
Admission: EM | Admit: 2017-07-14 | Discharge: 2017-07-15 | Disposition: A | Discharge status: Home / Self Care | Payer: PPO | Attending: Emergency Medicine | Admitting: Emergency Medicine

## 2017-07-14 ENCOUNTER — Emergency Department (HOSPITAL_COMMUNITY): Payer: PPO

## 2017-07-14 ENCOUNTER — Encounter (HOSPITAL_COMMUNITY): Payer: Self-pay | Admitting: Emergency Medicine

## 2017-07-14 DIAGNOSIS — Z79899 Other long term (current) drug therapy: Secondary | ICD-10-CM | POA: Insufficient documentation

## 2017-07-14 DIAGNOSIS — R0602 Shortness of breath: Secondary | ICD-10-CM | POA: Diagnosis not present

## 2017-07-14 DIAGNOSIS — R072 Precordial pain: Secondary | ICD-10-CM

## 2017-07-14 DIAGNOSIS — Z7982 Long term (current) use of aspirin: Secondary | ICD-10-CM | POA: Insufficient documentation

## 2017-07-14 DIAGNOSIS — Z955 Presence of coronary angioplasty implant and graft: Secondary | ICD-10-CM | POA: Diagnosis not present

## 2017-07-14 DIAGNOSIS — R079 Chest pain, unspecified: Secondary | ICD-10-CM | POA: Diagnosis not present

## 2017-07-14 DIAGNOSIS — Z87891 Personal history of nicotine dependence: Secondary | ICD-10-CM | POA: Insufficient documentation

## 2017-07-14 DIAGNOSIS — I70213 Atherosclerosis of native arteries of extremities with intermittent claudication, bilateral legs: Secondary | ICD-10-CM | POA: Insufficient documentation

## 2017-07-14 DIAGNOSIS — I10 Essential (primary) hypertension: Secondary | ICD-10-CM | POA: Insufficient documentation

## 2017-07-14 DIAGNOSIS — I2510 Atherosclerotic heart disease of native coronary artery without angina pectoris: Secondary | ICD-10-CM | POA: Diagnosis not present

## 2017-07-14 LAB — CBC
HCT: 34.7 % — ABNORMAL LOW (ref 39.0–52.0)
Hemoglobin: 10.7 g/dL — ABNORMAL LOW (ref 13.0–17.0)
MCH: 22.2 pg — ABNORMAL LOW (ref 26.0–34.0)
MCHC: 30.8 g/dL (ref 30.0–36.0)
MCV: 72 fL — ABNORMAL LOW (ref 78.0–100.0)
Platelets: 164 10*3/uL (ref 150–400)
RBC: 4.82 MIL/uL (ref 4.22–5.81)
RDW: 15.7 % — ABNORMAL HIGH (ref 11.5–15.5)
WBC: 6 10*3/uL (ref 4.0–10.5)

## 2017-07-14 LAB — BASIC METABOLIC PANEL
Anion gap: 12 (ref 5–15)
BUN: 14 mg/dL (ref 6–20)
CO2: 24 mmol/L (ref 22–32)
Calcium: 9.7 mg/dL (ref 8.9–10.3)
Chloride: 100 mmol/L — ABNORMAL LOW (ref 101–111)
Creatinine, Ser: 1.21 mg/dL (ref 0.61–1.24)
GFR calc Af Amer: >60 mL/min (ref >60)
GFR calc non Af Amer: 56 mL/min — ABNORMAL LOW (ref >60)
Glucose, Bld: 91 mg/dL (ref 65–99)
Potassium: 3.6 mmol/L (ref 3.5–5.1)
Sodium: 136 mmol/L (ref 135–145)

## 2017-07-14 LAB — I-STAT TROPONIN, ED: Troponin i, poc: 0 ng/mL (ref 0.00–0.08)

## 2017-07-14 NOTE — ED Triage Notes (Signed)
Pt reports L sided CP X1 day, intermittent, tight in nature. Pt took 2TNG with relief. Denies SOB, N/V, dizziness, lightheadedness.

## 2017-07-14 NOTE — ED Provider Notes (Signed)
Patient placed in Quick Look pathway, seen and evaluated   Chief Complaint: Chest pain  HPI:   Chest pain started yesterday about 330-4pm.  He through it would go away and it got better after rest but when he woke up it was back and then around 530pm took 1 pill NTG which helped some but didn't take it away.  Occasionally radiates to left arm.  Takes one 81mg  aspirin every morning.    ROS: No N/V/D or cough, No SOB (one)  Physical Exam:   Gen: No distress  Neuro: Awake and Alert  Skin: Warm    Focused Exam:Heart RRR, No murmurs.    Initiation of care has begun. The patient has been counseled on the process, plan, and necessity for staying for the completion/evaluation, and the remainder of the medical screening examination    Ollen Gross 07/14/17 [place], [place], MD 07/16/17 2358

## 2017-07-15 DIAGNOSIS — R072 Precordial pain: Secondary | ICD-10-CM | POA: Diagnosis not present

## 2017-07-15 LAB — I-STAT TROPONIN, ED: Troponin i, poc: 0 ng/mL (ref 0.00–0.08)

## 2017-07-15 LAB — BRAIN NATRIURETIC PEPTIDE: B Natriuretic Peptide: 12.4 pg/mL (ref 0.0–100.0)

## 2017-07-15 MED ORDER — IBUPROFEN 400 MG PO TABS
400.0000 mg | ORAL_TABLET | Freq: Once | ORAL | Status: AC | PRN
Start: 2017-07-15 — End: 2017-07-15
  Administered 2017-07-15: 400 mg via ORAL
  Filled 2017-07-15: qty 1

## 2017-07-15 MED ORDER — LOSARTAN POTASSIUM 50 MG PO TABS
100.0000 mg | ORAL_TABLET | Freq: Every day | ORAL | Status: DC
  Administered 2017-07-15: 100 mg via ORAL
  Filled 2017-07-15 (×2): qty 2

## 2017-07-15 MED ORDER — AMLODIPINE BESYLATE 5 MG PO TABS
10.0000 mg | ORAL_TABLET | Freq: Once | ORAL | Status: AC
  Administered 2017-07-15: 10 mg via ORAL
  Filled 2017-07-15: qty 2

## 2017-07-15 MED ORDER — ACETAMINOPHEN 325 MG PO TABS
650.0000 mg | ORAL_TABLET | Freq: Once | ORAL | Status: AC
  Administered 2017-07-15: 650 mg via ORAL
  Filled 2017-07-15: qty 2

## 2017-07-15 MED ORDER — PANTOPRAZOLE SODIUM 40 MG PO TBEC
40.0000 mg | DELAYED_RELEASE_TABLET | Freq: Every day | ORAL | Status: DC
  Administered 2017-07-15: 40 mg via ORAL
  Filled 2017-07-15 (×2): qty 1

## 2017-07-15 MED ORDER — ISOSORBIDE MONONITRATE ER 30 MG PO TB24: 30.0000 mg | ORAL_TABLET | Freq: Every day | ORAL | 1 refills | Status: DC

## 2017-07-15 MED ORDER — ASPIRIN 81 MG PO CHEW
324.0000 mg | CHEWABLE_TABLET | Freq: Once | ORAL | Status: AC
  Administered 2017-07-15: 324 mg via ORAL
  Filled 2017-07-15: qty 4

## 2017-07-15 MED ORDER — HYDROCHLOROTHIAZIDE 25 MG PO TABS
25.0000 mg | ORAL_TABLET | Freq: Every day | ORAL | Status: DC
  Administered 2017-07-15: 25 mg via ORAL
  Filled 2017-07-15 (×2): qty 1

## 2017-07-15 NOTE — ED Notes (Signed)
Pt called for room.  Not located in waiting room.  Pt just came up to desk and stated he was in bathroom.

## 2017-07-15 NOTE — ED Notes (Signed)
Spoke with EDP currently waiting for cardiology disposition.

## 2017-07-15 NOTE — Consult Note (Addendum)
Cardiology Consultation:   [name]; [id number]; [birth year]   Admit date: 07/14/2017 Date of Consult: 07/15/2017  Primary Care Provider: Girtha Rm, NP-C Primary Cardiologist: Lauree Chandler, MD  Patient Profile:   Joel [name] is a 77 y.o. male with a hx of CAD status post bare-metal stent to OM 2, chronic chest pain syndrome, hypertension, hyperlipidemia, chronic kidney disease, peripheral vascular disease followed by Dr. Scot Dock and COPD who is being seen today for the evaluation of chest pain at the request of Dr. Sherry Ruffing.   History of multiple ER visit for recurrent chest pain.  Enzymes negative EKG without acute changes and sent home.  Last cardiac cath 08/01/13 with patent stent Circumflex and mild to moderate disease in the LAD and RCA.  Last lexiscan Myoview 02/2017 moderate sized inferior defect partially improves consistent with ischemia and possible soft tissue attenuation felt to be intermediate risk.  EF 55%. Symptoms improved with Imdur.   Last seen in clinic by APP February 25th 2019.  History of Present Illness:   Joel [name] presented for evaluation of left-sided chest pain.  His symptoms started Wednesday evening.  Took sublingual nitroglycerin and eventually fall asleep.  He describes his pain as dull achiness underneath left axilla.  Thursday morning he had a recurrent chest pain and took tramadol with improvement however his pain reoccurred.  He took another sublingual nitroglycerin in the evening with minimal improvement.  Due to ongoing symptoms he came to ER.  His pain eventually subsided while waiting in triage.  Troponin has been negative so far.  Electrolyte and kidney function are normal.  CBC concerning for iron deficiency anemia.  EKG without acute changes.  He is says he takes his medication.  Denies tobacco abuse.  Denies palpitation, orthopnea, PND, syncope, lower extremity edema, melena or blood in his stool or urine.   Says his symptom is similar when he had a stenting in 2014.  Past Medical History:  Diagnosis Date  . Atypical chest pain   . CAD (coronary artery disease)    a. Multiple prior evaluations then 05/2012 s/p BMS to OM2. b. Nuc 11/2012 wnl. c. Cath 2015: patent stent, moderate LAD/RCA dz, treated medically.  . Cancer ([place])    Hx: of prostate  . Cataract   . CKD (chronic kidney disease), stage II   . COPD (chronic obstructive pulmonary disease) ([place])   . Cough secondary to angiotensin converting enzyme inhibitor (ACE-I)   . Diabetes mellitus without complication ([place])   . Diverticulosis   . DVT (deep venous thrombosis) ([place])   . GERD (gastroesophageal reflux disease)   . Headache(784.0)    Hx: of  . History of blood transfusion    "I've had 2; don't know what it was related to" (11/09/2012)  . HLD (hyperlipidemia)   . HTN (hypertension)   . Iron deficiency anemia   . Microcytic anemia   . Peripheral vascular disease ([place])    a. L pop-tibial bypass 2011, followed by [facility].  . Rotator cuff injury    "left arm; never repaired" (11/09/2012)    Past Surgical History:  Procedure Laterality Date  . CARDIOVASCULAR STRESS TEST  Aug. 2014  . CATARACT EXTRACTION W/ INTRAOCULAR LENS IMPLANT Right ~ 2008  . CIRCUMCISION    . COLONOSCOPY    . CORONARY ANGIOPLASTY WITH STENT PLACEMENT  2014   "1" (11/09/2012)  . ILIAC ARTERY ANEURYSM REPAIR    . LEFT HEART CATH  05-31-12  . LEFT HEART CATHETERIZATION  WITH CORONARY ANGIOGRAM N/A 06/01/2011   Procedure: LEFT HEART CATHETERIZATION WITH CORONARY ANGIOGRAM;  Surgeon: Hillary Bow, MD;  Location: [facility] CATH LAB;  Service: Cardiovascular;  Laterality: N/A;  . LEFT HEART CATHETERIZATION WITH CORONARY ANGIOGRAM N/A 05/31/2012   Procedure: LEFT HEART CATHETERIZATION WITH CORONARY ANGIOGRAM;  Surgeon: Peter M Martinique, MD;  Location: [facility] CATH LAB;  Service: Cardiovascular;  Laterality: N/A;  . LEFT HEART CATHETERIZATION WITH CORONARY ANGIOGRAM N/A 08/01/2013    Procedure: LEFT HEART CATHETERIZATION WITH CORONARY ANGIOGRAM;  Surgeon: Burnell Blanks, MD;  Location: [facility] CATH LAB;  Service: Cardiovascular;  Laterality: N/A;  . PERCUTANEOUS CORONARY STENT INTERVENTION (PCI-S)  05/31/2012   Procedure: PERCUTANEOUS CORONARY STENT INTERVENTION (PCI-S);  Surgeon: Peter M Martinique, MD;  Location: [facility] CATH LAB;  Service: Cardiovascular;;  . PR VEIN BYPASS GRAFT,AORTO-FEM-POP Left 10/01/2009   left below knee popliteal artery to posterior tibial artery  . SHOULDER ARTHROSCOPY WITH OPEN ROTATOR CUFF REPAIR AND DISTAL CLAVICLE ACROMINECTOMY Left 01/12/2013   Procedure: LEFT SHOULDER ARTHROSCOPY WITH DEBRIDEMENT OPEN DISTAL CLAVICLE RESECTION ,acromioplastyAND ROTATOR CUFF REPAIR;  Surgeon: Yvette Rack., MD;  Location: [facility];  Service: Orthopedics;  Laterality: Left;      Inpatient Medications: Scheduled Meds: . hydrochlorothiazide  25 mg Oral Daily  . losartan  100 mg Oral Daily  . pantoprazole  40 mg Oral Daily   Continuous Infusions:  PRN Meds:   Allergies:    Allergies  Allergen Reactions  . Atenolol     bradycardia  . Imdur [Isosorbide Mononitrate] Nausea Only and Other (See Comments)    Headache  . Lisinopril Other (See Comments)    Cough  . Topiramate     Shortness of breath and leg cramps  . Tramadol-Acetaminophen Other (See Comments)    intolerance    Social History:   Social History   Socioeconomic History  . Marital status: Widowed    Spouse name: Not on file  . Number of children: Not on file  . Years of education: Not on file  . Highest education level: Not on file  Occupational History  . Occupation: Retired  Scientific laboratory technician  . Financial resource strain: Not on file  . Food insecurity:    Worry: Not on file    Inability: Not on file  . Transportation needs:    Medical: Not on file    Non-medical: Not on file  Tobacco Use  . Smoking status: Former Smoker    Packs/day: 0.00    Types: Cigarettes    Last attempt to  quit: 05/26/2011    Years since quitting: 6.1  . Smokeless tobacco: Never Used  Substance and Sexual Activity  . Alcohol use: No  . Drug use: No  . Sexual activity: Not on file  Lifestyle  . Physical activity:    Days per week: Not on file    Minutes per session: Not on file  . Stress: Not on file  Relationships  . Social connections:    Talks on phone: Not on file    Gets together: Not on file    Attends religious service: Not on file    Active member of club or organization: Not on file    Attends meetings of clubs or organizations: Not on file    Relationship status: Not on file  . Intimate partner violence:    Fear of current or ex partner: Not on file    Emotionally abused: Not on file    Physically abused: Not on  file    Forced sexual activity: Not on file  Other Topics Concern  . 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  Lives in [place] by himself.    Family History:    Family History  Problem Relation Age of Onset  . Breast cancer Mother        deceased  . Hyperlipidemia Mother   . Hypertension Mother   . COPD Father        deceased  . Cancer Father   . Hyperlipidemia Father   . Hypertension Father   . Hypertension Sister   . Cancer Brother   . Hypertension Brother   . Hypertension Daughter   . Hypertension Son   . Colon cancer Neg Hx        pt is unclear about family hx     ROS:  Please see the history of present illness.  All other ROS reviewed and negative.     Physical Exam/Data:   Vitals:   07/15/17 1330 07/15/17 1400 07/15/17 1415 07/15/17 1430  BP: (!) 123/58 (!) 146/89 110/86 137/79  Pulse: 80 67 67 71  Resp: 16 16 13 16   Temp:      TempSrc:      SpO2: 97% 100% 96% 99%  Weight:      Height:       No intake or output data in the 24 hours ending 07/15/17 1446 Filed Weights   07/14/17 1939  Weight: 220 lb (99.8 kg)   Body mass index is 31.57 kg/m.  General:  Well nourished, well  developed, in no acute distress HEENT: normal Lymph: no adenopathy Neck: no JVD Endocrine:  No thryomegaly Vascular: No carotid bruits; FA pulses 2+ bilaterally without bruits  Cardiac:  normal S1, S2; RRR; no murmur  Lungs:  clear to auscultation bilaterally, no wheezing, rhonchi or rales  Abd: soft, nontender, no hepatomegaly  Ext: no edema Musculoskeletal:  No deformities, BUE and BLE strength normal and equal Skin: warm and dry  Neuro:  CNs 2-12 intact, no focal abnormalities noted Psych:  Normal affect   EKG:  The EKG was personally reviewed and demonstrates: Sinus rhythm at controlled ventricular rate with first-degree AV block   Relevant CV Studies: Nuclear stress test 11/29/18Study Highlights    Nuclear stress EF: 55%.  Moderate sided inferior defect that partially improves consistent with ischemia and possible soft tissue attenuation. Small region of anterior/anteroseptal ischemia. (base)  This is an intermediate risk study.      Cath 07/2013 Angiographic Findings:  Left main: No obstructive disease.   Left Anterior Descending Artery: Large caliber vessel that courses to the apex. The proximal and mid vessel is ectatic. There is diffuse 30-40% stenosis in the mid vessel. There are mild luminal irregularities in the distal vessel.   Circumflex Artery: Large caliber vessel with two obtuse marginal branches. The first OM branch is small in caliber and is chronically occluded in the mid vessel. The second OM branch is patent with patent stent, minimal restenosis. The distal segment of the OM branch has diffuse plaque.   Right Coronary Artery: Large caliber dominant vessel with proximal Shepherd's crook. The proximal vessel has diffuse 40% stenosis. The mid vessel is ectatic with 40% stenosis. The distal vessel has diffuse 50% stenosis. The PDA has 50% proximal stenosis followed by diffuse plaque.   Left Ventricular Angiogram: LVEF=65%.   Impression: 1.  Triple vessel CAD with patent stent in the second  OM branch.  2. Moderate non-obstructive disease in the LAD and RCA 3. Normal LV function  Recommendations: Continue medical management of CAD.     Laboratory Data:  Chemistry Recent Labs  Lab 07/14/17 1947  NA 136  K 3.6  CL 100*  CO2 24  GLUCOSE 91  BUN 14  CREATININE 1.21  CALCIUM 9.7  GFRNONAA 56*  GFRAA >60  ANIONGAP 12    No results for input(s): PROT, ALBUMIN, AST, ALT, ALKPHOS, BILITOT in the last 168 hours. Hematology Recent Labs  Lab 07/14/17 1947  WBC 6.0  RBC 4.82  HGB 10.7*  HCT 34.7*  MCV 72.0*  MCH 22.2*  MCHC 30.8  RDW 15.7*  PLT 164   Cardiac EnzymesNo results for input(s): TROPONINI in the last 168 hours.  Recent Labs  Lab 07/14/17 1955 07/15/17 0028  TROPIPOC 0.00 0.00    BNP Recent Labs  Lab 07/15/17 0925  BNP 12.4    DDimer No results for input(s): DDIMER in the last 168 hours.  Radiology/Studies:  Dg Chest 2 View  Result Date: 07/14/2017 CLINICAL DATA:  77 y/o M; left-sided chest pain and shortness of breath for 1 day. EXAM: CHEST - 2 VIEW COMPARISON:  05/30/2017 chest radiograph. FINDINGS: Stable normal cardiac silhouette given projection and technique. Aortic atherosclerosis with calcification. Clear lungs. No pleural effusion or pneumothorax. No acute osseous abnormality is evident. IMPRESSION: No acute pulmonary process identified. Electronically Signed   By: Kristine Garbe M.D.   On: 07/14/2017 20:27    Assessment and Plan:   1. Chest pain -Seems atypical for angina.  Has ruled out.  Troponin has been negative.  BNP normal.  Symptoms somewhat improved with tramadol as well as nitroglycerin. - Last lexiscan Myoview 02/2017 moderate sized inferior defect partially improves consistent with ischemia and possible soft tissue attenuation felt to be intermediate risk.  EF 55%.  - He is not taking his imdur 30mg  qd. Will restart this. He is ok to go home after  ambulation. He has follow up with Dr. Angelena Form in few weeks.   2. HTN - BP stable. Continue home regimen.   3. HLD - He is not on any statin. Follow[facility] at [facility].   Encouraged to bring all medications.   For questions or updates, please contact [place] Please consult [email or web address] for contact info under Cardiology/STEMI.   Jarrett Soho, [place]  07/15/2017 2:46 PM   I have personally seen and examined this patient. I agree with the assessment and plan as outlined above.  He is well known to me. He has CAD and prior stenting of the Circ system.  His chest pain today is atypical. Troponin negative. EKG is normal. No ischemic changes.  My exam:  General: Well developed, well nourished, NAD  HEENT: OP clear, mucus membranes moist  SKIN: warm, dry. No rashes. Neuro: No focal deficits  Musculoskeletal: Muscle strength 5/5 all ext  Psychiatric: Mood and affect normal  Neck: No JVD, no carotid bruits, no thyromegaly, no lymphadenopathy.  Lungs:Clear bilaterally, no wheezes, rhonci, crackles Cardiovascular: Regular rate and rhythm. No murmurs, gallops or rubs. Abdomen:Soft. Bowel sounds present. Non-tender.  Extremities: No lower extremity edema. Pulses are 2 + in the bilateral DP/PT. Plan: His pain is atypical and resolved. NO evidence of ischemia.  OK to discharge home. We will start Imdur 30 mg daily (he reports not taking this at home) He has follow up with me in 2 weeks.   Lauree Chandler 07/15/2017 3:31 PM

## 2017-07-15 NOTE — ED Notes (Signed)
Cardiology provider at bedside

## 2017-07-15 NOTE — ED Triage Notes (Signed)
Cards at bedside to eval

## 2017-07-15 NOTE — ED Notes (Signed)
Declined W/C at D/C and was escorted to lobby by RN. 

## 2017-07-15 NOTE — Discharge Instructions (Signed)
Please follow-up with your cardiologist as directed for further management of your chest pain.  If you have any new or worsened symptoms, please return to the nearest emergency department.

## 2017-07-15 NOTE — ED Provider Notes (Signed)
[facility] EMERGENCY DEPARTMENT Provider Note   CSN: [id number] Arrival date & time: 07/14/17  1929     History   Chief Complaint Chief Complaint  Patient presents with  . Chest Pain    HPI Joel [name] is a 77 y.o. male.  The history is provided by the patient and medical records. No language interpreter was used.  Chest Pain   This is a new problem. The current episode started 2 days ago. The problem occurs daily. The problem has been resolved. The pain is present in the substernal region and lateral region. The pain is at a severity of 8/10. The pain is moderate. The quality of the pain is described as heavy and dull. The pain radiates to the left arm. Associated symptoms include shortness of breath. Pertinent negatives include no abdominal pain, no back pain, no cough, no diaphoresis, no exertional chest pressure, no fever, no headaches, no irregular heartbeat, no leg pain, no lower extremity edema, no malaise/fatigue, no nausea, no near-syncope, no palpitations and no sputum production. He has tried nitroglycerin for the symptoms. The treatment provided moderate relief. Risk factors include male gender.  His past medical history is significant for CAD and MI.    Past Medical History:  Diagnosis Date  . Atypical chest pain   . CAD (coronary artery disease)    a. Multiple prior evaluations then 05/2012 s/p BMS to OM2. b. Nuc 11/2012 wnl. c. Cath 2015: patent stent, moderate LAD/RCA dz, treated medically.  . Cancer ([place])    Hx: of prostate  . Cataract   . CKD (chronic kidney disease), stage II   . COPD (chronic obstructive pulmonary disease) ([place])   . Cough secondary to angiotensin converting enzyme inhibitor (ACE-I)   . Diabetes mellitus without complication ([place])   . Diverticulosis   . DVT (deep venous thrombosis) ([place])   . GERD (gastroesophageal reflux disease)   . Headache(784.0)    Hx: of  . History of blood transfusion    "I've had 2; don't know  what it was related to" (11/09/2012)  . HLD (hyperlipidemia)   . HTN (hypertension)   . Iron deficiency anemia   . Microcytic anemia   . Peripheral vascular disease ([place])    a. L pop-tibial bypass 2011, followed by [facility].  . Rotator cuff injury    "left arm; never repaired" (11/09/2012)    Patient Active Problem List   Diagnosis Date Noted  . Elliptocytosis on peripheral blood smear ([place]) 09/13/2016  . Prediabetes 03/22/2016  . PVD (peripheral vascular disease) with claudication ([place]) 11/15/2012  . Chest pain, localized 11/10/2012  . Hypokalemia 11/10/2012  . Bradycardia 11/09/2012  . Thrombocytopenia ([place]) 06/01/2012  . Microcytic anemia   . Atherosclerosis of native arteries of the extremities with intermittent claudication 06/16/2011  . Stable angina ([place]) 06/11/2011  . CAD (coronary artery disease)   . Chest pain, atypical 05/30/2011  . Iron deficiency anemia 10/03/2009  . DIVERTICULOSIS OF COLON 10/03/2009  . WEIGHT LOSS, ABNORMAL 10/03/2009  . CHEST PAIN UNSPECIFIED 09/13/2008  . Headache(784.0) 08/19/2008  . TIA 06/05/2008  . SHOULDER PAIN, LEFT 10/11/2007  . COUGH DUE TO ACE INHIBITORS 05/07/2007  . History of tobacco use disorder 04/25/2007  . Hyperlipidemia 04/21/2007  . Essential hypertension 04/21/2007  . GERD 04/21/2007    Past Surgical History:  Procedure Laterality Date  . CARDIOVASCULAR STRESS TEST  Aug. 2014  . CATARACT EXTRACTION W/ INTRAOCULAR LENS IMPLANT Right ~ 2008  . CIRCUMCISION    .  COLONOSCOPY    . CORONARY ANGIOPLASTY WITH STENT PLACEMENT  2014   "1" (11/09/2012)  . ILIAC ARTERY ANEURYSM REPAIR    . LEFT HEART CATH  05-31-12  . LEFT HEART CATHETERIZATION WITH CORONARY ANGIOGRAM N/A 06/01/2011   Procedure: LEFT HEART CATHETERIZATION WITH CORONARY ANGIOGRAM;  Surgeon: Hillary Bow, MD;  Location[facility] CATH LAB;  Service: Cardiovascular;  Laterality: N/A;  . LEFT HEART CATHETERIZATION WITH CORONARY ANGIOGRAM N/A 05/31/2012   Procedure: LEFT HEART  CATHETERIZATION WITH CORONARY ANGIOGRAM;  Surgeon: Peter M Martinique, MD;  Location: [facility] CATH LAB;  Service: Cardiovascular;  Laterality: N/A;  . LEFT HEART CATHETERIZATION WITH CORONARY ANGIOGRAM N/A 08/01/2013   Procedure: LEFT HEART CATHETERIZATION WITH CORONARY ANGIOGRAM;  Surgeon: Burnell Blanks, MD;  Location: [facility] CATH LAB;  Service: Cardiovascular;  Laterality: N/A;  . PERCUTANEOUS CORONARY STENT INTERVENTION (PCI-S)  05/31/2012   Procedure: PERCUTANEOUS CORONARY STENT INTERVENTION (PCI-S);  Surgeon: Peter M Martinique, MD;  Location: [facility] CATH LAB;  Service: Cardiovascular;;  . PR VEIN BYPASS GRAFT,AORTO-FEM-POP Left 10/01/2009   left below knee popliteal artery to posterior tibial artery  . SHOULDER ARTHROSCOPY WITH OPEN ROTATOR CUFF REPAIR AND DISTAL CLAVICLE ACROMINECTOMY Left 01/12/2013   Procedure: LEFT SHOULDER ARTHROSCOPY WITH DEBRIDEMENT OPEN DISTAL CLAVICLE RESECTION ,acromioplastyAND ROTATOR CUFF REPAIR;  Surgeon: Yvette Rack., MD;  Location: [facility];  Service: Orthopedics;  Laterality: Left;        Home Medications    Prior to Admission medications   Medication Sig Start Date End Date Taking? Authorizing Provider  abiraterone Acetate (ZYTIGA) 250 MG tablet Take 1,000 mg by mouth daily. Take on an empty stomach 1 hour before or 2 hours after a meal    [provider]  albuterol (PROVENTIL HFA;VENTOLIN HFA) 108 (90 Base) MCG/ACT inhaler Inhale 2 puffs every 4 (four) hours as needed into the lungs for wheezing or shortness of breath.    [provider]  amLODipine (NORVASC) 10 MG tablet Take 1 tablet (10 mg total) by mouth daily. 05/17/17   Henson, Vickie L, NP-C  aspirin EC 81 MG tablet Take 81 mg by mouth daily.     [provider]  ferrous sulfate 325 (65 FE) MG tablet Take 325 mg by mouth daily with breakfast.    [provider]  gabapentin (NEURONTIN) 100 MG capsule TAKE 1 CAPSULE BY MOUTH AS NEEDED FOR HEADACHE PAIN. MAY REPEAT AFTER 2 HOURS IF  NEEDED 08/24/16   [provider]  gabapentin (NEURONTIN) 600 MG tablet Take 600 mg by mouth at bedtime.    [provider]  Ipratropium-Albuterol (COMBIVENT RESPIMAT) 20-100 MCG/ACT AERS respimat Inhale 1 puff into the lungs every 6 (six) hours. 04/21/17   Palumbo, April, MD  isosorbide mononitrate (IMDUR) 30 MG 24 hr tablet Take 1 tablet (30 mg total) by mouth daily. 05/30/17 06/29/17  Long, Wonda Olds, MD  losartan-hydrochlorothiazide (HYZAAR) 100-25 MG tablet Take 1 tablet by mouth daily. 06/06/17   Imogene Burn, PA-C  metFORMIN (GLUCOPHAGE) 500 MG tablet Take 500 mg by mouth 2 (two) times daily with a meal. Reported on 08/27/2015    [provider]  nitroGLYCERIN (NITROSTAT) 0.4 MG SL tablet Place 1 tablet (0.4 mg total) under the tongue every 5 (five) minutes as needed for chest pain. 12/08/15   Imogene Burn, PA-C  omeprazole (PRILOSEC) 20 MG capsule Take 20 mg by mouth 2 (two) times daily before a meal.    [provider]  predniSONE (DELTASONE) 20 MG  tablet 3 tabs po day one, then 2 po daily x 4 days 04/21/17   Palumbo, April, MD  tamsulosin (FLOMAX) 0.4 MG CAPS capsule Take 0.4 mg at bedtime by mouth.    [provider]  traMADol (ULTRAM) 50 MG tablet take 1 tablet by mouth every 8 hours if needed FOR PAIN 06/28/17   Girtha Rm, NP-C    Family History Family History  Problem Relation Age of Onset  . Breast cancer Mother        deceased  . Hyperlipidemia Mother   . Hypertension Mother   . COPD Father        deceased  . Cancer Father   . Hyperlipidemia Father   . Hypertension Father   . Hypertension Sister   . Cancer Brother   . Hypertension Brother   . Hypertension Daughter   . Hypertension Son   . Colon cancer Neg Hx        pt is unclear about family hx    Social History Social History   Tobacco Use  . Smoking status: Former Smoker    Packs/day: 0.00    Types: Cigarettes    Last attempt to quit: 05/26/2011    Years  since quitting: 6.1  . Smokeless tobacco: Never Used  Substance Use Topics  . Alcohol use: No  . Drug use: No     Allergies   Atenolol; Imdur [isosorbide mononitrate]; Lisinopril; Topiramate; and Tramadol-acetaminophen   Review of Systems Review of Systems  Constitutional: Negative for chills, diaphoresis, fatigue, fever and malaise/fatigue.  HENT: Negative for congestion and rhinorrhea.   Respiratory: Positive for shortness of breath. Negative for cough, sputum production, choking, chest tightness and wheezing.   Cardiovascular: Positive for chest pain. Negative for palpitations, leg swelling and near-syncope.  Gastrointestinal: Negative for abdominal pain, constipation, diarrhea and nausea.  Musculoskeletal: Negative for back pain, neck pain and neck stiffness.  Skin: Negative for rash and wound.  Neurological: Positive for light-headedness. Negative for headaches.  Psychiatric/Behavioral: Negative for agitation.  All other systems reviewed and are negative.    Physical Exam Updated Vital Signs BP 131/71   Pulse (!) 58   Temp 98.1 F (36.7 C) (Oral)   Resp 16   Ht 5\' 10"  (1.778 m)   Wt 99.8 kg (220 lb)   SpO2 98%   BMI 31.57 kg/m   Physical Exam  Constitutional: He is oriented to person, place, and time. He appears well-developed and well-nourished.  Non-toxic appearance. He does not appear ill. No distress.  HENT:  Head: Normocephalic.  Mouth/Throat: Oropharynx is clear and moist. No oropharyngeal exudate.  Eyes: Pupils are equal, round, and reactive to light.  Neck: Normal range of motion.  Cardiovascular: Normal rate, regular rhythm and normal pulses.  No extrasystoles are present.  No murmur heard.  No systolic murmur is present.  No diastolic murmur is present. Pulmonary/Chest: Effort normal. He has no decreased breath sounds. He has no wheezes. He has no rhonchi. He has rales (in the bases) in the right lower field and the left lower field.  Abdominal:  Soft. He exhibits no distension. There is no tenderness.  Musculoskeletal: Normal range of motion. He exhibits no edema or tenderness.       Right lower leg: He exhibits no tenderness and no edema.       Left lower leg: He exhibits no tenderness and no edema.  Lymphadenopathy:    He has no cervical adenopathy.  Neurological: He is alert and  oriented to person, place, and time.  Skin: Capillary refill takes less than 2 seconds. He is not diaphoretic. No erythema. No pallor.  Psychiatric: He has a normal mood and affect. His behavior is normal. His mood appears not anxious. He is not agitated.  Nursing note and vitals reviewed.    ED Treatments / Results  Labs (all labs ordered are listed, but only abnormal results are displayed) Labs Reviewed  BASIC METABOLIC PANEL - Abnormal; Notable for the following components:      Result Value   Chloride 100 (*)    GFR calc non Af Amer 56 (*)    All other components within normal limits  CBC - Abnormal; Notable for the following components:   Hemoglobin 10.7 (*)    HCT 34.7 (*)    MCV 72.0 (*)    MCH 22.2 (*)    RDW 15.7 (*)    All other components within normal limits  BRAIN NATRIURETIC PEPTIDE  I-STAT TROPONIN, ED  I-STAT TROPONIN, ED    EKG EKG Interpretation  Date/Time:  Thursday July 14 2017 19:33:03 EDT Ventricular Rate:  67 PR Interval:  218 QRS Duration: 96 QT Interval:  406 QTC Calculation: 429 R Axis:   63 Text Interpretation:  Sinus rhythm with 1st degree A-V block Otherwise normal ECG When compared to prior, no significant changes seen.  No STEMI Confirmed by Antony Blackbird [phone]) on 07/15/2017 7:15:13 AM   Radiology Dg Chest 2 View  Result Date: 07/14/2017 CLINICAL DATA:  77 y/o M; left-sided chest pain and shortness of breath for 1 day. EXAM: CHEST - 2 VIEW COMPARISON:  05/30/2017 chest radiograph. FINDINGS: Stable normal cardiac silhouette given projection and technique. Aortic atherosclerosis with calcification.  Clear lungs. No pleural effusion or pneumothorax. No acute osseous abnormality is evident. IMPRESSION: No acute pulmonary process identified. Electronically Signed   By: Kristine Garbe M.D.   On: 07/14/2017 20:27    Procedures Procedures (including critical care time)  Medications Ordered in ED Medications  pantoprazole (PROTONIX) EC tablet 40 mg (40 mg Oral Not Given 07/15/17 1539)  losartan (COZAAR) tablet 100 mg (100 mg Oral Not Given 07/15/17 1539)  hydrochlorothiazide (HYDRODIURIL) tablet 25 mg (0 mg Oral Hold 07/15/17 1539)  ibuprofen (ADVIL,MOTRIN) tablet 400 mg (400 mg Oral Given 07/15/17 0028)  acetaminophen (TYLENOL) tablet 650 mg (650 mg Oral Given 07/15/17 0028)  aspirin chewable tablet 324 mg (324 mg Oral Given 07/15/17 1032)  amLODipine (NORVASC) tablet 10 mg (10 mg Oral Given 07/15/17 1032)     Initial Impression / Assessment and Plan / ED Course  I have reviewed the triage vital signs and the nursing notes.  Pertinent labs & imaging results that were available during my care of the patient were reviewed by me and considered in my medical decision making (see chart for details).     Joel [name] is a 77 y.o. male with a past medical history significant for CAD status post PCI, COPD, DVT, prior prostate cancer, peripheral vascular disease, hypertension, upper lipidemia, CKD, diabetes, and prior anemia who presents with chest pain.  Patient reports that for the last 2 days he has had intermittent chest pain.  He describes it as a pressure in his left central chest that radiates into his left arm.  He reports it improved with 2 nitroglycerin that he took yesterday.  Initially was in and out of 10 severity but is now a 0 out of 10.  He reports associated shortness of breath on  the pain was severe.  He denies nausea vomiting or diaphoresis at this time.  He reports it is not exertional and is not pleuritic.  He does describe that this feels similar to prior MI.  He denies recent  trauma, fevers, chills, congestion, or cough.  He denies any new leg pain or leg swelling.  On exam, lungs have mild crackles in the bases.  No murmurs appreciated.  Chest is nontender and his discomfort is not reproducible.  Abdomen is nontender.  Legs are nonedematous.  Patient has symmetric pulses in all extremities.  Patient is alert and oriented.  Patient is not diaphoretic and is resting comfortably.  Next  Initial EKG appears unchanged from prior.  No STEMI seen.  Next  Laboratory testing showed negative troponin x2.  Mild anemia similar to prior.  BMP reassuring.  Chest x-ray showed no acute cardia pulmonary process.  Chart review shows that patient had recurrent chest pain last month and followed up with cardiology after negative troponins.  They referred to a Myoview scan in November of last year with a moderate sized inferior defect likely consistent with prior ischemia.  EF was 55 at that time.  Given the patient's pain feeling similar to prior MI, cardiology be called for recommendations. PT is currently chest pain free.   Cardiology came to see the patient and felt he was safe for discharge home with his reassuring workup and remaining chest pain-free.  They will see him in 2 weeks and prescribed him Imdur to take.  Patient understood return precautions and help better.  Patient agreed with discharge.  Patient is understanding of the plan of care.    Final Clinical Impressions(s) / ED Diagnoses   Final diagnoses:  Precordial pain    ED Discharge Orders        Ordered    isosorbide mononitrate (IMDUR) 30 MG 24 hr tablet  Daily     07/15/17 1516      Clinical Impression: 1. Precordial pain     Disposition: Discharge  Condition: Good  I have discussed the results, Dx and Tx plan with the pt(& family if present). He/she/they expressed understanding and agree(s) with the plan. Discharge instructions discussed at great length. Strict return precautions discussed and pt  &/or family have verbalized understanding of the instructions. No further questions at time of discharge.    Discharge Medication List as of 07/15/2017  4:06 PM      Follow Up: Burnell Blanks, MD [place] [address] [place]  [id number] [phone]  Go on 08/01/2017 @10 :20am for follow up  [place] [address] [id number] [place]       Erion Hermans, Gwenyth Allegra, MD 07/15/17 [id number]

## 2017-07-18 ENCOUNTER — Other Ambulatory Visit: Payer: Self-pay | Admitting: *Deleted

## 2017-07-18 ENCOUNTER — Telehealth: Payer: Self-pay | Admitting: Cardiovascular Disease

## 2017-07-18 MED ORDER — ISOSORBIDE MONONITRATE ER 30 MG PO TB24: 30.0000 mg | ORAL_TABLET | Freq: Every day | ORAL | 9 refills | Status: DC

## 2017-07-18 NOTE — Telephone Encounter (Signed)
Joel [name] is calling because

## 2017-07-25 DIAGNOSIS — R51 Headache: Secondary | ICD-10-CM | POA: Diagnosis not present

## 2017-07-31 NOTE — Progress Notes (Signed)
Chief Complaint  Patient presents with  . Follow-up    CAD     History of Present Illness: 77 yo male with history of HTN, HLD, CAD, PAD, DM who is here today for cardiac follow up. He has been followed in the past by Dr. Lia Foyer. Cardiac cath February 2014  severe OM2 stenosis treated with bare metal stent. Admitted to [facility] 11/09/12 with chest pain. Ruled out for MI. Lexiscan stress myoview 11/16/12 without ischemia. Normal LVEF.  I saw him in the office 07/27/13 and he c/o exertional chest pain c/w unstable angina. Cardiac cath 08/01/13 with patent stent Circumflex and mild to moderate disease in the LAD and RCA. His PAD is followed in [facility] by Dr. Scot Dock. Seen in the ED 12/15/13 with c/o chest pain. Negative troponin and discharged home. He was seen here 03/04/14 and had c/o chest pain after working on his car but quickly resolved. He was seen in the ED 09/10/14 with atypical chest pain. Troponin was negative x 2 and EKG was unchanged. Multiple ED visits with chest pain in 2017. Nuclear stress test November 2018 with  EF 55% with moderate inferior defect that partially improves consistent with ischemia and possible soft tissue attenuation. Small region of anterior anterior septal ischemia. Diagnosed with prostate cancer in 2019. I saw him in the ED 07/15/17 and he had atypical chest pain with negative troponin and no EKG changes.   He is here today for follow up. The patient denies any chest pain, dyspnea, palpitations, lower extremity edema, orthopnea, PND, dizziness, near syncope or syncope. He stopped Pravastatin because he didn't think he needed it.     Primary Care Physician:  Girtha Rm, NP-C  Past Medical History:  Diagnosis Date  . Atypical chest pain   . CAD (coronary artery disease)    a. Multiple prior evaluations then 05/2012 s/p BMS to OM2. b. Nuc 11/2012 wnl. c. Cath 2015: patent stent, moderate LAD/RCA dz, treated medically.  . Cancer ([place])    Hx: of prostate  . Cataract   .  CKD (chronic kidney disease), stage II   . COPD (chronic obstructive pulmonary disease) ([place])   . Cough secondary to angiotensin converting enzyme inhibitor (ACE-I)   . Diabetes mellitus without complication ([place])   . Diverticulosis   . DVT (deep venous thrombosis) ([place])   . GERD (gastroesophageal reflux disease)   . Headache(784.0)    Hx: of  . History of blood transfusion    "I've had 2; don't know what it was related to" (11/09/2012)  . HLD (hyperlipidemia)   . HTN (hypertension)   . Iron deficiency anemia   . Microcytic anemia   . Peripheral vascular disease ([place])    a. L pop-tibial bypass 2011, followed by [facility].  . Rotator cuff injury    "left arm; never repaired" (11/09/2012)    Past Surgical History:  Procedure Laterality Date  . CARDIOVASCULAR STRESS TEST  Aug. 2014  . CATARACT EXTRACTION W/ INTRAOCULAR LENS IMPLANT Right ~ 2008  . CIRCUMCISION    . COLONOSCOPY    . CORONARY ANGIOPLASTY WITH STENT PLACEMENT  2014   "1" (11/09/2012)  . ILIAC ARTERY ANEURYSM REPAIR    . LEFT HEART CATH  05-31-12  . LEFT HEART CATHETERIZATION WITH CORONARY ANGIOGRAM N/A 06/01/2011   Procedure: LEFT HEART CATHETERIZATION WITH CORONARY ANGIOGRAM;  Surgeon: Hillary Bow, MD;  Location: [facility] CATH LAB;  Service: Cardiovascular;  Laterality: N/A;  . LEFT HEART CATHETERIZATION WITH CORONARY ANGIOGRAM  N/A 05/31/2012   Procedure: LEFT HEART CATHETERIZATION WITH CORONARY ANGIOGRAM;  Surgeon: Peter M Martinique, MD;  Location: [facility] CATH LAB;  Service: Cardiovascular;  Laterality: N/A;  . LEFT HEART CATHETERIZATION WITH CORONARY ANGIOGRAM N/A 08/01/2013   Procedure: LEFT HEART CATHETERIZATION WITH CORONARY ANGIOGRAM;  Surgeon: Burnell Blanks, MD;  Location: [facility] CATH LAB;  Service: Cardiovascular;  Laterality: N/A;  . PERCUTANEOUS CORONARY STENT INTERVENTION (PCI-S)  05/31/2012   Procedure: PERCUTANEOUS CORONARY STENT INTERVENTION (PCI-S);  Surgeon: Peter M Martinique, MD;  Location: [facility] CATH LAB;  Service:  Cardiovascular;;  . PR VEIN BYPASS GRAFT,AORTO-FEM-POP Left 10/01/2009   left below knee popliteal artery to posterior tibial artery  . SHOULDER ARTHROSCOPY WITH OPEN ROTATOR CUFF REPAIR AND DISTAL CLAVICLE ACROMINECTOMY Left 01/12/2013   Procedure: LEFT SHOULDER ARTHROSCOPY WITH DEBRIDEMENT OPEN DISTAL CLAVICLE RESECTION ,acromioplastyAND ROTATOR CUFF REPAIR;  Surgeon: Yvette Rack., MD;  Location: [facility];  Service: Orthopedics;  Laterality: Left;    Current Outpatient Medications  Medication Sig Dispense Refill  . abiraterone Acetate (ZYTIGA) 250 MG tablet Take 1,000 mg by mouth daily. Take on an empty stomach 1 hour before or 2 hours after a meal    . albuterol (PROVENTIL HFA;VENTOLIN HFA) 108 (90 Base) MCG/ACT inhaler Inhale 2 puffs every 4 (four) hours as needed into the lungs for wheezing or shortness of breath.    Marland Kitchen amLODipine (NORVASC) 10 MG tablet Take 1 tablet (10 mg total) by mouth daily. 30 tablet 2  . aspirin EC 81 MG tablet Take 81 mg by mouth daily.     . ferrous sulfate 325 (65 FE) MG tablet Take 325 mg by mouth daily with breakfast.    . gabapentin (NEURONTIN) 100 MG capsule TAKE 1 CAPSULE BY MOUTH AS NEEDED FOR HEADACHE PAIN. MAY REPEAT AFTER 2 HOURS IF NEEDED  0  . gabapentin (NEURONTIN) 600 MG tablet Take 600 mg by mouth at bedtime.    . Ipratropium-Albuterol (COMBIVENT RESPIMAT) 20-100 MCG/ACT AERS respimat Inhale 1 puff into the lungs every 6 (six) hours. 1 Inhaler 0  . isosorbide mononitrate (IMDUR) 30 MG 24 hr tablet Take 1 tablet (30 mg total) by mouth daily. 30 tablet 9  . losartan-hydrochlorothiazide (HYZAAR) 100-25 MG tablet Take 1 tablet by mouth daily. 30 tablet 11  . metFORMIN (GLUCOPHAGE) 500 MG tablet Take 500 mg by mouth 2 (two) times daily with a meal. Reported on 08/27/2015    . nitroGLYCERIN (NITROSTAT) 0.4 MG SL tablet Place 1 tablet (0.4 mg total) under the tongue every 5 (five) minutes as needed for chest pain. 25 tablet 2  . omeprazole (PRILOSEC) 20 MG  capsule Take 20 mg by mouth 2 (two) times daily before a meal.    . predniSONE (DELTASONE) 20 MG tablet 3 tabs po day one, then 2 po daily x 4 days 11 tablet 0  . tamsulosin (FLOMAX) 0.4 MG CAPS capsule Take 0.4 mg at bedtime by mouth.    . traMADol (ULTRAM) 50 MG tablet take 1 tablet by mouth every 8 hours if needed FOR PAIN 15 tablet 0  . pravastatin (PRAVACHOL) 40 MG tablet Take 1 tablet (40 mg total) by mouth every evening. 30 tablet 11   Current Facility-Administered Medications  Medication Dose Route Frequency Provider Last Rate Last Dose  . 0.9 %  sodium chloride infusion  500 mL Intravenous Continuous Gatha Mayer, MD        Allergies  Allergen Reactions  . Atenolol     bradycardia  .  Imdur [Isosorbide Mononitrate] Nausea Only and Other (See Comments)    Headache  . Lisinopril Other (See Comments)    Cough  . Topiramate     Shortness of breath and leg cramps  . Tramadol-Acetaminophen Other (See Comments)    intolerance    Social History   Socioeconomic History  . Marital status: Widowed    Spouse name: Not on file  . Number of children: Not on file  . Years of education: Not on file  . Highest education level: Not on file  Occupational History  . Occupation: Retired  Scientific laboratory technician  . Financial resource strain: Not on file  . Food insecurity:    Worry: Not on file    Inability: Not on file  . Transportation needs:    Medical: Not on file    Non-medical: Not on file  Tobacco Use  . Smoking status: Former Smoker    Packs/day: 0.00    Types: Cigarettes    Last attempt to quit: 05/26/2011    Years since quitting: 6.1  . Smokeless tobacco: Never Used  Substance and Sexual Activity  . Alcohol use: No  . Drug use: No  . Sexual activity: Not on file  Lifestyle  . Physical activity:    Days per week: Not on file    Minutes per session: Not on file  . Stress: Not on file  Relationships  . Social connections:    Talks on phone: Not on file    Gets together:  Not on file    Attends religious service: Not on file    Active member of club or organization: Not on file    Attends meetings of clubs or organizations: Not on file    Relationship status: Not on file  . Intimate partner violence:    Fear of current or ex partner: Not on file    Emotionally abused: Not on file    Physically abused: Not on file    Forced sexual activity: Not on file  Other Topics Concern  . 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  Lives in [place] by himself.    Family History  Problem Relation Age of Onset  . Breast cancer Mother        deceased  . Hyperlipidemia Mother   . Hypertension Mother   . COPD Father        deceased  . Cancer Father   . Hyperlipidemia Father   . Hypertension Father   . Hypertension Sister   . Cancer Brother   . Hypertension Brother   . Hypertension Daughter   . Hypertension Son   . Colon cancer Neg Hx        pt is unclear about family hx    Review of Systems:  As stated in the HPI and otherwise negative.   BP 138/78   Pulse 82   Ht 5\' 10"  (1.778 m)   Wt 233 lb (105.7 kg)   SpO2 98%   BMI 33.43 kg/m   Physical Examination:  General: Well developed, well nourished, NAD  HEENT: OP clear, mucus membranes moist  SKIN: warm, dry. No rashes. Neuro: No focal deficits  Musculoskeletal: Muscle strength 5/5 all ext  Psychiatric: Mood and affect normal  Neck: No JVD, no carotid bruits, no thyromegaly, no lymphadenopathy.  Lungs:Clear bilaterally, no wheezes, rhonci, crackles Cardiovascular: Regular rate and rhythm. No murmurs, gallops or rubs. Abdomen:Soft. Bowel sounds present. Non-tender.  Extremities:  No lower extremity edema. Pulses are 2 + in the bilateral DP/PT.  Cardiac cath 08/01/13: Left main: No obstructive disease.  Left Anterior Descending Artery: Large caliber vessel that courses to the apex. The proximal and mid vessel is ectatic. There is diffuse 30-40% stenosis in  the mid vessel. There are mild luminal irregularities in the distal vessel.  Circumflex Artery: Large caliber vessel with two obtuse marginal branches. The first OM branch is small in caliber and is chronically occluded in the mid vessel. The second OM branch is patent with patent stent, minimal restenosis. The distal segment of the OM branch has diffuse plaque.  Right Coronary Artery: Large caliber dominant vessel with proximal Shepherd's crook. The proximal vessel has diffuse 40% stenosis. The mid vessel is ectatic with 40% stenosis. The distal vessel has diffuse 50% stenosis. The PDA has 50% proximal stenosis followed by diffuse plaque.  Left Ventricular Angiogram: LVEF=65%.   EKG:  EKG is not ordered today. The ekg ordered today demonstrates   Recent Labs: 11/12/2016: ALT 13 07/14/2017: BUN 14; Creatinine, Ser 1.21; Hemoglobin 10.7; Platelets 164; Potassium 3.6; Sodium 136 07/15/2017: B Natriuretic Peptide 12.4    Wt Readings from Last 3 Encounters:  08/01/17 233 lb (105.7 kg)  07/14/17 220 lb (99.8 kg)  06/06/17 240 lb (108.9 kg)     Other studies Reviewed: Additional studies/ records that were reviewed today include: . Review of the above records demonstrates:    Assessment and Plan:   1. CAD with stable angina: No change in chronic chest pain. Most recent cath 2015 with stable CAD. Nuclear stress test in November 2018 with no large areas of ischemia. Will continue ASA, Norvasc and Imdur.    2. HTN: BP controlled. No changes.   3. Hyperlipidemia: Lipids followed in primary care.  He has been off of Pravastatin. Will restart Pravastatin today and check lipids and LFTs in 12 weeks.   Current medicines are reviewed at length with the patient today.  The patient does not have concerns regarding medicines.  The following changes have been made:  no change  Labs/ tests ordered today include:   Orders Placed This Encounter  Procedures  . Lipid Profile  . Hepatic function panel     Disposition:   FU with me in  6 months  Signed, Lauree Chandler, MD 08/01/2017 10:35 AM    [facility] [place], [place], [place]  [address] Phone: [phone]; Fax: [phone]

## 2017-08-01 ENCOUNTER — Encounter: Payer: Self-pay | Admitting: Cardiovascular Disease

## 2017-08-01 ENCOUNTER — Ambulatory Visit: Payer: PPO | Admitting: Cardiovascular Disease

## 2017-08-01 VITALS — BP 138/78 | HR 82 | Ht 70.0 in | Wt 233.0 lb

## 2017-08-01 DIAGNOSIS — I2510 Atherosclerotic heart disease of native coronary artery without angina pectoris: Secondary | ICD-10-CM

## 2017-08-01 DIAGNOSIS — E7800 Pure hypercholesterolemia, unspecified: Secondary | ICD-10-CM

## 2017-08-01 DIAGNOSIS — I10 Essential (primary) hypertension: Secondary | ICD-10-CM | POA: Diagnosis not present

## 2017-08-01 MED ORDER — PRAVASTATIN SODIUM 40 MG PO TABS: 40.0000 mg | ORAL_TABLET | Freq: Every evening | ORAL | 11 refills | Status: DC

## 2017-08-01 NOTE — Patient Instructions (Addendum)
Medication Instructions:  Your physician has recommended you make the following change in your medication:  Resume pravastatin 40 mg by mouth daily.    Labwork: Your physician recommends that you return for lab work in 13 weeks.  This will be fasting. Lipid and liver profiles. --Scheduled for July 15,2019.  The lab opens at 7:30 AM   Testing/Procedures: none  Follow-Up: Your physician recommends that you schedule a follow-up appointment in: 6 months. Please call our office in June to schedule this appointment    Any Other Special Instructions Will Be Listed Below (If Applicable).     If you need a refill on your cardiac medications before your next appointment, please call your pharmacy.

## 2017-08-06 ENCOUNTER — Encounter (HOSPITAL_COMMUNITY): Payer: Self-pay | Admitting: Family Medicine

## 2017-08-06 ENCOUNTER — Other Ambulatory Visit: Payer: Self-pay

## 2017-08-06 ENCOUNTER — Ambulatory Visit (HOSPITAL_COMMUNITY)
Admission: EM | Admit: 2017-08-06 | Discharge: 2017-08-06 | Disposition: A | Discharge status: Home / Self Care | Payer: PPO | Attending: Family Medicine | Admitting: Family Medicine

## 2017-08-06 DIAGNOSIS — B9789 Other viral agents as the cause of diseases classified elsewhere: Secondary | ICD-10-CM | POA: Diagnosis not present

## 2017-08-06 DIAGNOSIS — J069 Acute upper respiratory infection, unspecified: Secondary | ICD-10-CM | POA: Diagnosis not present

## 2017-08-06 MED ORDER — BENZONATATE 100 MG PO CAPS: 100.0000 mg | ORAL_CAPSULE | Freq: Three times a day (TID) | ORAL | 0 refills | Status: DC | PRN

## 2017-08-06 MED ORDER — PREDNISONE 20 MG PO TABS: ORAL_TABLET | ORAL | 0 refills | Status: DC

## 2017-08-06 NOTE — ED Provider Notes (Signed)
[facility]   [id number] 08/06/17 Arrival Time: 1812   SUBJECTIVE:  Joel [name] is a 77 y.o. male who presents to the urgent care with complaint of Cough and nasal congestion and drainage starting yesterday.  No fever or h/o asthma.  Notes rhinorrhea and some eye watering and itchiness.  No nausea or vomiting, no sore throat or ear pain.   Past Medical History:  Diagnosis Date  . Atypical chest pain   . CAD (coronary artery disease)    a. Multiple prior evaluations then 05/2012 s/p BMS to OM2. b. Nuc 11/2012 wnl. c. Cath 2015: patent stent, moderate LAD/RCA dz, treated medically.  . Cancer ([place])    Hx: of prostate  . Cataract   . CKD (chronic kidney disease), stage II   . COPD (chronic obstructive pulmonary disease) ([place])   . Cough secondary to angiotensin converting enzyme inhibitor (ACE-I)   . Diabetes mellitus without complication ([place])   . Diverticulosis   . DVT (deep venous thrombosis) ([place])   . GERD (gastroesophageal reflux disease)   . Headache(784.0)    Hx: of  . History of blood transfusion    "I've had 2; don't know what it was related to" (11/09/2012)  . HLD (hyperlipidemia)   . HTN (hypertension)   . Iron deficiency anemia   . Microcytic anemia   . Peripheral vascular disease ([place])    a. L pop-tibial bypass 2011, followed by [facility].  . Rotator cuff injury    "left arm; never repaired" (11/09/2012)   Family History  Problem Relation Age of Onset  . Breast cancer Mother        deceased  . Hyperlipidemia Mother   . Hypertension Mother   . COPD Father        deceased  . Cancer Father   . Hyperlipidemia Father   . Hypertension Father   . Hypertension Sister   . Cancer Brother   . Hypertension Brother   . Hypertension Daughter   . Hypertension Son   . Colon cancer Neg Hx        pt is unclear about family hx   Social History   Socioeconomic History  . Marital status: Widowed    Spouse name: Not on file  . Number of children: Not on  file  . Years of education: Not on file  . Highest education level: Not on file  Occupational History  . Occupation: Retired  Scientific laboratory technician  . Financial resource strain: Not on file  . Food insecurity:    Worry: Not on file    Inability: Not on file  . Transportation needs:    Medical: Not on file    Non-medical: Not on file  Tobacco Use  . Smoking status: Former Smoker    Packs/day: 0.00    Types: Cigarettes    Last attempt to quit: 05/26/2011    Years since quitting: 6.2  . Smokeless tobacco: Never Used  Substance and Sexual Activity  . Alcohol use: No  . Drug use: No  . Sexual activity: Not on file  Lifestyle  . Physical activity:    Days per week: Not on file    Minutes per session: Not on file  . Stress: Not on file  Relationships  . Social connections:    Talks on phone: Not on file    Gets together: Not on file    Attends religious service: Not on file    Active member of club or organization: Not on  file    Attends meetings of clubs or organizations: Not on file    Relationship status: Not on file  . Intimate partner violence:    Fear of current or ex partner: Not on file    Emotionally abused: Not on file    Physically abused: Not on file    Forced sexual activity: Not on file  Other Topics Concern  . 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   Current Facility-Administered Medications for the 08/06/17 encounter [facility] Encounter)  Medication  . 0.9 %  sodium chloride infusion   No outpatient medications have been marked as taking for the 08/06/17 encounter [facility] Encounter).   Allergies  Allergen Reactions  . Atenolol     bradycardia  . Imdur [Isosorbide Mononitrate] Nausea Only and Other (See Comments)    Headache  . Lisinopril Other (See Comments)    Cough  . Topiramate     Shortness of breath and leg cramps  . Tramadol-Acetaminophen Other (See Comments)    intolerance        ROS: As per HPI, remainder of ROS negative.   OBJECTIVE:   Vitals:   08/06/17 1826  BP: 140/71  Pulse: 89  Temp: 98.2 F (36.8 C)  TempSrc: Oral  SpO2: 98%     General appearance: alert; no distress Eyes: PERRL; EOMI; conjunctiva watery eyes HENT: normocephalic; atraumatic; TMs normal, canal normal, external ears normal without trauma; nasal mucosa normal; oral mucosa normal Neck: supple Lungs: expiratory wheezes on auscultation bilaterally Heart: regular rate and rhythm Back: no CVA tenderness Extremities: no cyanosis or edema; symmetrical with no gross deformities Skin: warm and dry Neurologic: normal gait; grossly normal Psychological: alert and cooperative; normal mood and affect      Labs:  Results for orders placed or performed during the hospital encounter of 91/63/84  Basic metabolic panel  Result Value Ref Range   Sodium 136 135 - 145 mmol/L   Potassium 3.6 3.5 - 5.1 mmol/L   Chloride 100 (L) 101 - 111 mmol/L   CO2 24 22 - 32 mmol/L   Glucose, Bld 91 65 - 99 mg/dL   BUN 14 6 - 20 mg/dL   Creatinine, Ser 1.21 0.61 - 1.24 mg/dL   Calcium 9.7 8.9 - 10.3 mg/dL   GFR calc non Af Amer 56 (L) >60 mL/min   GFR calc Af Amer >60 >60 mL/min   Anion gap 12 5 - 15  CBC  Result Value Ref Range   WBC 6.0 4.0 - 10.5 K/uL   RBC 4.82 4.22 - 5.81 MIL/uL   Hemoglobin 10.7 (L) 13.0 - 17.0 g/dL   HCT 34.7 (L) 39.0 - 52.0 %   MCV 72.0 (L) 78.0 - 100.0 fL   MCH 22.2 (L) 26.0 - 34.0 pg   MCHC 30.8 30.0 - 36.0 g/dL   RDW 15.7 (H) 11.5 - 15.5 %   Platelets 164 150 - 400 K/uL  Brain natriuretic peptide  Result Value Ref Range   B Natriuretic Peptide 12.4 0.0 - 100.0 pg/mL  I-stat troponin, ED  Result Value Ref Range   Troponin i, poc 0.00 0.00 - 0.08 ng/mL   Comment 3          I-stat troponin, ED  Result Value Ref Range   Troponin i, poc 0.00 0.00 - 0.08 ng/mL   Comment 3  Labs Reviewed - No data to display  No results  found.     ASSESSMENT & PLAN:  1. Viral URI with cough     Meds ordered this encounter  Medications  . predniSONE (DELTASONE) 20 MG tablet    Sig: Two daily with food    Dispense:  10 tablet    Refill:  0  . benzonatate (TESSALON) 100 MG capsule    Sig: Take 1-2 capsules (100-200 mg total) by mouth 3 (three) times daily as needed for cough.    Dispense:  20 capsule    Refill:  0    Reviewed expectations re: course of current medical issues. Questions answered. Outlined signs and symptoms indicating need for more acute intervention. Patient verbalized understanding. After Visit Summary given.    Procedures:      Robyn Haber, MD 08/06/17 [phone]

## 2017-08-06 NOTE — ED Triage Notes (Signed)
Cough and nasal congestion and drainage off and on but got worse yesterday

## 2017-08-10 ENCOUNTER — Other Ambulatory Visit: Payer: Self-pay | Admitting: Family Medicine

## 2017-08-23 NOTE — Progress Notes (Signed)
Joel [name] is a 77 y.o. male who presents for annual wellness visit and follow-up on chronic medical conditions.  He has the following concerns:  States he had an MRI yesterday for a 2 week history of right low back pain. He does not have the results. Reports low back pain has improved significantly.   History of prostate cancer. States his oncologist with the [facility] told him that his back pain is not related his prostate cancer. He is relieved. He was told they want to do  He is on oral medication for prostate cancer.   He is followed closely by his cardiologist.    Immunization History  Administered Date(s) Administered  . Influenza, High Dose Seasonal PF 02/08/2017   [facility] Last [facility]: [facility] Dentist: at the [place]. He is going to get his dentures replaced.  Ophtho: January 2019.  Exercise:not the last 2 weeks due to back pain  Other doctors caring for patient include: Dr. Dinah Beers- Cancer Dr. At [facility]- was seeing Dr. Irene Limbo at [facility] [place] [facility] Dr. Laurell Roof- Primary provider at [facility] Dr. Bobette Mo- Vascular Cardiology- Dr. Angelena Form   Depression screen:  See questionnaire below.     Depression screen [facility] 2/9 08/24/2017 06/05/2014  Decreased Interest 0 0  Down, Depressed, Hopeless 0 0  PHQ - 2 Score 0 0  Some recent data might be hidden    Fall Screen: See Questionaire below.   Fall Risk  08/24/2017 06/05/2014  Falls in the past year? No No    ADL screen:  See questionnaire below.  Functional Status Survey: Is the patient deaf or have difficulty hearing?: No Does the patient have difficulty seeing, even when wearing glasses/contacts?: No Does the patient have difficulty concentrating, remembering, or making decisions?: No Does the patient have difficulty walking or climbing stairs?: No Does the patient have difficulty dressing or bathing?: No Does the patient have difficulty doing errands alone such as visiting a doctor's office or shopping?: No   End of  Life Discussion:  Patient does not have a living will and medical power of attorney but his daughter Meredith Mody is who he plans to have as his documented HCPOA. MOST form filled out.    Review of Systems  Constitutional: -fever, -chills, -sweats, -unexpected weight change, -anorexia, -fatigue Allergy: -sneezing, -itching, -congestion Dermatology: denies changing moles, rash, lumps, new worrisome lesions ENT: -runny nose, -ear pain, -sore throat, -hoarseness, -sinus pain, -teeth pain, -tinnitus, -hearing loss, -epistaxis Cardiology:  -chest pain, -palpitations, -edema, -orthopnea, -paroxysmal nocturnal dyspnea Respiratory: -cough, -shortness of breath, -dyspnea on exertion, -wheezing, -hemoptysis Gastroenterology: -abdominal pain, -nausea, -vomiting, -diarrhea, -constipation, -blood in stool, -changes in bowel movement, -dysphagia Hematology: -bleeding or bruising problems Musculoskeletal: -arthralgias, -myalgias, -joint swelling, + low back pain, -neck pain, -cramping, -gait changes Ophthalmology: -vision changes, -eye redness, -itching, -discharge Urology: -dysuria, -difficulty urinating, -hematuria, -urinary frequency, -urgency, incontinence Neurology: -headache, -weakness, -tingling, -numbness, -speech abnormality, -memory loss, -falls, -dizziness Psychology:  -depressed mood, -agitation, -sleep problems   PHYSICAL EXAM:  BP (!) 120/58   Pulse 71   Ht 5' 9.5" (1.765 m)   Wt 234 lb 6.4 oz (106.3 kg)   SpO2 97%   BMI 34.12 kg/m   General Appearance: Alert, cooperative, no distress, appears stated age Head: Normocephalic, without obvious abnormality, atraumatic Eyes: PERRL, conjunctiva/corneas clear, EOM's intact, fundi benign Ears: Normal TM's and external ear canals Nose: Nares normal, mucosa normal, no drainage or sinus tenderness Throat: Lips, mucosa, and tongue normal; dentures  in place and gums normal Neck: Supple, no lymphadenopathy, thyroid:no  enlargement/tenderness/nodules; no carotid bruit or JVD Back: Spine nontender, no curvature, ROM normal, no CVA tenderness Lungs: Clear to auscultation bilaterally without wheezes, rales or ronchi; respirations unlabored Chest Wall: No tenderness or deformity Heart: Regular rate and rhythm, S1 and S2 normal, no murmur, rub or gallop Breast Exam: No chest wall tenderness, masses or gynecomastia Abdomen: Soft, non-tender, nondistended, normoactive bowel sounds, no masses, no hepatosplenomegaly Genitalia: declines. Urologist visit recently  Rectal: declines. Up to date colonoscopy and urology visit Extremities: No clubbing, cyanosis or edema Pulses: 2+ and symmetric all extremities Skin: Skin color, texture, turgor normal, no rashes or lesions Lymph nodes: Cervical, supraclavicular, and axillary nodes normal Neurologic: CNII-XII intact, normal strength, sensation and gait; reflexes 2+ and symmetric throughout   Psych: Normal mood, affect, hygiene and grooming  ASSESSMENT/PLAN: Medicare annual wellness visit, subsequent - Plan: POCT Urinalysis DIP (Proadvantage Device)  Essential hypertension - Plan: CBC with Differential/Platelet, Comprehensive metabolic panel  Coronary artery disease involving native coronary artery of native heart without angina pectoris  Pure hypercholesterolemia - Plan: Lipid panel  Prediabetes - Plan: HgB A1c  Routine general medical examination at a health care facility  Prostate cancer [facility])  Reports doing well with all medications, good compliance and no concerns or issues.  No Falls, concerns with mood, ADLs or memory.  Hgb A1c is 6.4% and still in pre-diabetic range.  Counseling on healthy diet and exercise.  Will check fasting lipids. His cardiologist started him back on pravastatin and he is scheduled to follow up for fasting lipids in July. He would like to have his fasting cholesterol checked today as well. Discussed that this is too soon to tell if the  statin has made a difference. He would like to check anyway.  Prostate cancer being followed by his urologist and oncologist.  HTN controlled. He is followed by cardiologist.  Up to date with health maintenance.  Discussed advance directives and MOST form filled out in the office today.  Immunizations appear to be up to date. Not on file. He will attempt to get records from [place] in [place].    Discussed PSA screening (risks/benefits), recommended at least 30 minutes of aerobic activity at least 5 days/week; proper sunscreen use reviewed; healthy diet and alcohol recommendations (less than or equal to 2 drinks/day) reviewed; regular seatbelt use; changing batteries in smoke detectors. Immunization recommendations discussed.  Colonoscopy recommendations reviewed.   Medicare Attestation I have personally reviewed: The patient's medical and social history Their use of alcohol, tobacco or illicit drugs Their current medications and supplements The patient's functional ability including ADLs,fall risks, home safety risks, cognitive, and hearing and visual impairment Diet and physical activities Evidence for depression or mood disorders  The patient's weight, height, and BMI have been recorded in the chart.  I have made referrals, counseling, and provided education to the patient based on review of the above and I have provided the patient with a written personalized care plan for preventive services.     [name], NP-C   08/24/2017

## 2017-08-24 ENCOUNTER — Ambulatory Visit (INDEPENDENT_AMBULATORY_CARE_PROVIDER_SITE_OTHER): Payer: PPO | Admitting: Family Medicine

## 2017-08-24 ENCOUNTER — Encounter: Payer: Self-pay | Admitting: Family Medicine

## 2017-08-24 VITALS — BP 120/58 | HR 71 | Ht 69.5 in | Wt 234.4 lb

## 2017-08-24 DIAGNOSIS — I10 Essential (primary) hypertension: Secondary | ICD-10-CM | POA: Diagnosis not present

## 2017-08-24 DIAGNOSIS — I2510 Atherosclerotic heart disease of native coronary artery without angina pectoris: Secondary | ICD-10-CM | POA: Diagnosis not present

## 2017-08-24 DIAGNOSIS — R7303 Prediabetes: Secondary | ICD-10-CM

## 2017-08-24 DIAGNOSIS — Z0000 Encounter for general adult medical examination without abnormal findings: Secondary | ICD-10-CM | POA: Diagnosis not present

## 2017-08-24 DIAGNOSIS — E7800 Pure hypercholesterolemia, unspecified: Secondary | ICD-10-CM | POA: Diagnosis not present

## 2017-08-24 DIAGNOSIS — C61 Malignant neoplasm of prostate: Secondary | ICD-10-CM

## 2017-08-24 LAB — POCT URINALYSIS DIP (PROADVANTAGE DEVICE)
Bilirubin, UA: NEGATIVE
Blood, UA: NEGATIVE
Glucose, UA: NEGATIVE mg/dL
Ketones, POC UA: NEGATIVE mg/dL
Leukocytes, UA: NEGATIVE
Nitrite, UA: NEGATIVE
Protein Ur, POC: NEGATIVE mg/dL
Specific Gravity, Urine: 1.015
Urobilinogen, Ur: NEGATIVE
pH, UA: 7.5 (ref 5.0–8.0)

## 2017-08-24 LAB — POCT GLYCOSYLATED HEMOGLOBIN (HGB A1C): Hemoglobin A1C: 6.4

## 2017-08-24 NOTE — Patient Instructions (Signed)
If you can, bring in your immunizations and lab results from the [place].

## 2017-08-25 LAB — COMPREHENSIVE METABOLIC PANEL
ALT: 19 IU/L (ref 0–44)
AST: 13 IU/L (ref 0–40)
Albumin/Globulin Ratio: 1.8 (ref 1.2–2.2)
Albumin: 4.5 g/dL (ref 3.5–4.8)
Alkaline Phosphatase: 66 IU/L (ref 39–117)
BUN/Creatinine Ratio: 13 (ref 10–24)
BUN: 14 mg/dL (ref 8–27)
Bilirubin Total: 0.3 mg/dL (ref 0.0–1.2)
CO2: 26 mmol/L (ref 20–29)
Calcium: 9.6 mg/dL (ref 8.6–10.2)
Chloride: 102 mmol/L (ref 96–106)
Creatinine, Ser: 1.12 mg/dL (ref 0.76–1.27)
GFR calc Af Amer: 73 mL/min/{1.73_m2} (ref >59)
GFR calc non Af Amer: 63 mL/min/{1.73_m2} (ref >59)
Globulin, Total: 2.5 g/dL (ref 1.5–4.5)
Glucose: 104 mg/dL — ABNORMAL HIGH (ref 65–99)
Potassium: 4.1 mmol/L (ref 3.5–5.2)
Sodium: 143 mmol/L (ref 134–144)
Total Protein: 7 g/dL (ref 6.0–8.5)

## 2017-08-25 LAB — CBC WITH DIFFERENTIAL/PLATELET
Basophils Absolute: 0 10*3/uL (ref 0.0–0.2)
Basos: 1 %
EOS (ABSOLUTE): 0.2 10*3/uL (ref 0.0–0.4)
Eos: 4 %
Hematocrit: 36.1 % — ABNORMAL LOW (ref 37.5–51.0)
Hemoglobin: 11.3 g/dL — ABNORMAL LOW (ref 13.0–17.7)
Immature Grans (Abs): 0 10*3/uL (ref 0.0–0.1)
Immature Granulocytes: 0 %
Lymphocytes Absolute: 1.1 10*3/uL (ref 0.7–3.1)
Lymphs: 21 %
MCH: 22.3 pg — ABNORMAL LOW (ref 26.6–33.0)
MCHC: 31.3 g/dL — ABNORMAL LOW (ref 31.5–35.7)
MCV: 71 fL — ABNORMAL LOW (ref 79–97)
Monocytes Absolute: 0.3 10*3/uL (ref 0.1–0.9)
Monocytes: 6 %
Neutrophils Absolute: 3.4 10*3/uL (ref 1.4–7.0)
Neutrophils: 68 %
Platelets: 153 10*3/uL (ref 150–379)
RBC: 5.07 x10E6/uL (ref 4.14–5.80)
RDW: 16.8 % — ABNORMAL HIGH (ref 12.3–15.4)
WBC: 5.1 10*3/uL (ref 3.4–10.8)

## 2017-08-25 LAB — LIPID PANEL
Chol/HDL Ratio: 3.4 ratio (ref 0.0–5.0)
Cholesterol, Total: 185 mg/dL (ref 100–199)
HDL: 54 mg/dL (ref >39)
LDL Calculated: 109 mg/dL — ABNORMAL HIGH (ref 0–99)
Triglycerides: 112 mg/dL (ref 0–149)
VLDL Cholesterol Cal: 22 mg/dL (ref 5–40)

## 2017-08-26 ENCOUNTER — Other Ambulatory Visit: Payer: Self-pay

## 2017-08-26 MED ORDER — TRAMADOL HCL 50 MG PO TABS: ORAL_TABLET | ORAL | 0 refills | Status: DC

## 2017-08-26 NOTE — Telephone Encounter (Signed)
Received a voicemail that patient is wanting a refill on Tramadol 50 mg until he goes to the [place] next week for his back pain.   [facility] [employer] officer, [employer] and [employer].

## 2017-08-26 NOTE — Telephone Encounter (Signed)
Ok to refill 

## 2017-09-07 ENCOUNTER — Telehealth: Payer: Self-pay | Admitting: Family Medicine

## 2017-09-07 ENCOUNTER — Encounter: Payer: Self-pay | Admitting: *Deleted

## 2017-09-07 NOTE — Telephone Encounter (Signed)
Received requested colonoscopy from [facility]. Sending back for review.

## 2017-09-13 DIAGNOSIS — Z9849 Cataract extraction status, unspecified eye: Secondary | ICD-10-CM | POA: Diagnosis not present

## 2017-09-13 DIAGNOSIS — H04123 Dry eye syndrome of bilateral lacrimal glands: Secondary | ICD-10-CM | POA: Diagnosis not present

## 2017-09-13 DIAGNOSIS — H47231 Glaucomatous optic atrophy, right eye: Secondary | ICD-10-CM | POA: Diagnosis not present

## 2017-09-13 DIAGNOSIS — H40022 Open angle with borderline findings, high risk, left eye: Secondary | ICD-10-CM | POA: Diagnosis not present

## 2017-09-13 DIAGNOSIS — H02825 Cysts of left lower eyelid: Secondary | ICD-10-CM | POA: Diagnosis not present

## 2017-09-13 DIAGNOSIS — H401211 Low-tension glaucoma, right eye, mild stage: Secondary | ICD-10-CM | POA: Diagnosis not present

## 2017-09-13 DIAGNOSIS — Z961 Presence of intraocular lens: Secondary | ICD-10-CM | POA: Diagnosis not present

## 2017-09-13 DIAGNOSIS — H02821 Cysts of right upper eyelid: Secondary | ICD-10-CM | POA: Diagnosis not present

## 2017-09-13 DIAGNOSIS — H02824 Cysts of left upper eyelid: Secondary | ICD-10-CM | POA: Diagnosis not present

## 2017-09-13 DIAGNOSIS — H18413 Arcus senilis, bilateral: Secondary | ICD-10-CM | POA: Diagnosis not present

## 2017-09-13 DIAGNOSIS — H11153 Pinguecula, bilateral: Secondary | ICD-10-CM | POA: Diagnosis not present

## 2017-10-21 ENCOUNTER — Telehealth: Payer: Self-pay | Admitting: Genetics

## 2017-10-21 ENCOUNTER — Encounter: Payer: Self-pay | Admitting: Genetics

## 2017-10-21 NOTE — Telephone Encounter (Signed)
A genetic counseling appointment has been scheduled for the pt to see Jerene Bears on 7/18 at 11am. Pt aware to arrive 30 minutes early. Letter mailed.

## 2017-10-24 ENCOUNTER — Telehealth: Payer: Self-pay | Admitting: Licensed Clinical Social Worker

## 2017-10-24 ENCOUNTER — Other Ambulatory Visit: Payer: PPO | Admitting: *Deleted

## 2017-10-24 DIAGNOSIS — E7800 Pure hypercholesterolemia, unspecified: Secondary | ICD-10-CM

## 2017-10-24 LAB — HEPATIC FUNCTION PANEL
ALT: 13 IU/L (ref 0–44)
AST: 11 IU/L (ref 0–40)
Albumin: 4.1 g/dL (ref 3.5–4.8)
Alkaline Phosphatase: 66 IU/L (ref 39–117)
Bilirubin Total: 0.2 mg/dL (ref 0.0–1.2)
Bilirubin, Direct: 0.07 mg/dL (ref 0.00–0.40)
Total Protein: 6.5 g/dL (ref 6.0–8.5)

## 2017-10-24 LAB — LIPID PANEL
Chol/HDL Ratio: 3.7 ratio (ref 0.0–5.0)
Cholesterol, Total: 174 mg/dL (ref 100–199)
HDL: 47 mg/dL (ref >39)
LDL Calculated: 100 mg/dL — ABNORMAL HIGH (ref 0–99)
Triglycerides: 135 mg/dL (ref 0–149)
VLDL Cholesterol Cal: 27 mg/dL (ref 5–40)

## 2017-10-24 NOTE — Telephone Encounter (Signed)
Pt's genetic counseling appointment has been rescheduled for the pt to see Epimenio Foot on 7/18 at 1pm. Pt has been made aware and agreed to the new appt date and time.

## 2017-10-25 ENCOUNTER — Telehealth: Payer: Self-pay | Admitting: Cardiovascular Disease

## 2017-10-25 NOTE — Telephone Encounter (Signed)
New Message: ° ° ° ° ° °Pt is returning a call for lab results. °

## 2017-10-25 NOTE — Telephone Encounter (Signed)
Spoke with patient and informed him of lab results/recommendations.  He verbalized understanding.

## 2017-10-26 ENCOUNTER — Ambulatory Visit: Payer: PPO | Admitting: Vascular Surgery

## 2017-10-26 ENCOUNTER — Encounter: Payer: Self-pay | Admitting: Vascular Surgery

## 2017-10-26 ENCOUNTER — Other Ambulatory Visit: Payer: Self-pay

## 2017-10-26 ENCOUNTER — Ambulatory Visit (INDEPENDENT_AMBULATORY_CARE_PROVIDER_SITE_OTHER)
Admission: RE | Admit: 2017-10-26 | Discharge: 2017-10-26 | Disposition: A | Discharge status: Home / Self Care | Payer: PPO | Source: Ambulatory Visit | Attending: Vascular Surgery | Admitting: Vascular Surgery

## 2017-10-26 ENCOUNTER — Ambulatory Visit (HOSPITAL_COMMUNITY)
Admission: RE | Admit: 2017-10-26 | Discharge: 2017-10-26 | Disposition: A | Discharge status: Home / Self Care | Payer: PPO | Source: Ambulatory Visit | Attending: Vascular Surgery | Admitting: Vascular Surgery

## 2017-10-26 VITALS — BP 131/70 | HR 54 | Temp 96.9°F | Resp 18 | Ht 70.0 in | Wt 236.0 lb

## 2017-10-26 DIAGNOSIS — Z9862 Peripheral vascular angioplasty status: Secondary | ICD-10-CM

## 2017-10-26 DIAGNOSIS — I739 Peripheral vascular disease, unspecified: Secondary | ICD-10-CM | POA: Diagnosis not present

## 2017-10-26 DIAGNOSIS — I779 Disorder of arteries and arterioles, unspecified: Secondary | ICD-10-CM | POA: Diagnosis not present

## 2017-10-26 DIAGNOSIS — Z87891 Personal history of nicotine dependence: Secondary | ICD-10-CM | POA: Insufficient documentation

## 2017-10-26 DIAGNOSIS — R9389 Abnormal findings on diagnostic imaging of other specified body structures: Secondary | ICD-10-CM | POA: Insufficient documentation

## 2017-10-26 DIAGNOSIS — I70202 Unspecified atherosclerosis of native arteries of extremities, left leg: Secondary | ICD-10-CM | POA: Diagnosis not present

## 2017-10-26 DIAGNOSIS — R0989 Other specified symptoms and signs involving the circulatory and respiratory systems: Secondary | ICD-10-CM | POA: Diagnosis present

## 2017-10-26 NOTE — Progress Notes (Signed)
Patient name: Joel [name] MRN: [id number] DOB: March 25, 1941 Sex: male  REASON FOR VISIT:   Follow-up of peripheral vascular disease.  HPI:   Joel [name] is a pleasant 77 y.o. male who I last saw on 10/20/2016.  He is undergone a previous left below-knee popliteal to tibial artery bypass in 2011.  When I last saw him, his bypass graft was patent with biphasic signals.  ABI on the left was 100%.  ABI of the right was 79%.  Comes in for a one-year follow-up visit.  Since I saw him last, he denies any claudication, rest pain, or nonhealing ulcers.  He has recently developed some low back pain with pain on the lateral aspect of his right thigh.  This pain occurs with ambulation but also with standing.  He is being worked up for degenerative disc disease of his back.  He is not a smoker.  Past Medical History:  Diagnosis Date  . Atypical chest pain   . CAD (coronary artery disease)    a. Multiple prior evaluations then 05/2012 s/p BMS to OM2. b. Nuc 11/2012 wnl. c. Cath 2015: patent stent, moderate LAD/RCA dz, treated medically.  . Cancer ([place])    Hx: of prostate  . Cataract   . CKD (chronic kidney disease), stage II   . COPD (chronic obstructive pulmonary disease) ([place])   . Cough secondary to angiotensin converting enzyme inhibitor (ACE-I)   . Diabetes mellitus without complication ([place])   . Diverticulosis   . DVT (deep venous thrombosis) ([place])   . GERD (gastroesophageal reflux disease)   . Headache(784.0)    Hx: of  . History of blood transfusion    "I've had 2; don't know what it was related to" (11/09/2012)  . HLD (hyperlipidemia)   . HTN (hypertension)   . Iron deficiency anemia   . Microcytic anemia   . Peripheral vascular disease ([place])    a. L pop-tibial bypass 2011, followed by [facility].  . Rotator cuff injury    "left arm; never repaired" (11/09/2012)    Family History  Problem Relation Age of Onset  . Breast cancer Mother        deceased  . Hyperlipidemia Mother     . Hypertension Mother   . COPD Father        deceased  . Cancer Father   . Hyperlipidemia Father   . Hypertension Father   . Hypertension Sister   . Cancer Brother   . Hypertension Brother   . Hypertension Daughter   . Hypertension Son   . Colon cancer Neg Hx        pt is unclear about family hx    SOCIAL HISTORY: He quit smoking in 2013. Social History   Tobacco Use  . Smoking status: Former Smoker    Packs/day: 0.00    Types: Cigarettes    Last attempt to quit: 05/26/2011    Years since quitting: 6.4  . Smokeless tobacco: Never Used  Substance Use Topics  . Alcohol use: No    Allergies  Allergen Reactions  . Atenolol     bradycardia  . Lisinopril Other (See Comments)    Cough  . Topiramate     Shortness of breath and leg cramps  . Tramadol-Acetaminophen Other (See Comments)    intolerance    Current Outpatient Medications  Medication Sig Dispense Refill  . abiraterone Acetate (ZYTIGA) 250 MG tablet Take 1,000 mg by mouth daily. Take on an empty stomach 1 hour  before or 2 hours after a meal    . albuterol (PROVENTIL HFA;VENTOLIN HFA) 108 (90 Base) MCG/ACT inhaler Inhale 2 puffs every 4 (four) hours as needed into the lungs for wheezing or shortness of breath.    Marland Kitchen amLODipine (NORVASC) 10 MG tablet TAKE 1 TABLET BY MOUTH ONCE DAILY 30 tablet 2  . aspirin EC 81 MG tablet Take 81 mg by mouth daily.     . brimonidine (ALPHAGAN) 0.2 % ophthalmic solution INT 1 GTT REY BID  0  . ferrous sulfate 325 (65 FE) MG tablet Take 325 mg by mouth daily with breakfast.    . gabapentin (NEURONTIN) 600 MG tablet Take 600 mg by mouth at bedtime.    . isosorbide mononitrate (IMDUR) 30 MG 24 hr tablet Take 30 mg by mouth daily.    Marland Kitchen losartan-hydrochlorothiazide (HYZAAR) 100-25 MG tablet Take 1 tablet by mouth daily. 30 tablet 11  . metFORMIN (GLUCOPHAGE) 500 MG tablet Take 500 mg by mouth 2 (two) times daily with a meal. Reported on 08/27/2015    . nitroGLYCERIN (NITROSTAT) 0.4 MG SL  tablet Place 1 tablet (0.4 mg total) under the tongue every 5 (five) minutes as needed for chest pain. 25 tablet 2  . omeprazole (PRILOSEC) 20 MG capsule Take 20 mg by mouth 2 (two) times daily before a meal.    . pravastatin (PRAVACHOL) 40 MG tablet Take 1 tablet (40 mg total) by mouth every evening. 30 tablet 11  . prednisoLONE 5 MG TABS tablet Take 5 mg by mouth daily.    . tamsulosin (FLOMAX) 0.4 MG CAPS capsule Take 0.4 mg at bedtime by mouth.    . traMADol (ULTRAM) 50 MG tablet take 1 tablet by mouth every 8 hours if needed FOR PAIN 30 tablet 0  . gabapentin (NEURONTIN) 100 MG capsule TAKE 1 CAPSULE BY MOUTH AS NEEDED FOR HEADACHE PAIN. MAY REPEAT AFTER 2 HOURS IF NEEDED  0   Current Facility-Administered Medications  Medication Dose Route Frequency Provider Last Rate Last Dose  . 0.9 %  sodium chloride infusion  500 mL Intravenous Continuous Gatha Mayer, MD        REVIEW OF SYSTEMS:  [X]  denotes positive finding, [ ]  denotes negative finding Cardiac  Comments:  Chest pain or chest pressure:    Shortness of breath upon exertion:    Short of breath when lying flat:    Irregular heart rhythm:        Vascular    Pain in calf, thigh, or hip brought on by ambulation: x   Pain in feet at night that wakes you up from your sleep:     Blood clot in your veins:    Leg swelling:         Pulmonary    Oxygen at home:    Productive cough:     Wheezing:         Neurologic    Sudden weakness in arms or legs:     Sudden numbness in arms or legs:     Sudden onset of difficulty speaking or slurred speech:    Temporary loss of vision in one eye:     Problems with dizziness:         Gastrointestinal    Blood in stool:     Vomited blood:         Genitourinary    Burning when urinating:     Blood in urine:        Psychiatric  Major depression:         Hematologic    Bleeding problems:    Problems with blood clotting too easily:        Skin    Rashes or ulcers:          Constitutional    Fever or chills:     PHYSICAL EXAM:   Vitals:   10/26/17 1052  BP: 131/70  Pulse: (!) 54  Resp: 18  Temp: (!) 96.9 F (36.1 C)  TempSrc: Oral  SpO2: 100%  Weight: 236 lb (107 kg)  Height: 5\' 10"  (1.778 m)    GENERAL: The patient is a well-nourished male, in no acute distress. The vital signs are documented above. CARDIAC: There is a regular rate and rhythm.  VASCULAR: I do not detect carotid bruits. He has palpable femoral pulses. On the left side he has a palpable posterior tibial pulse. I cannot palpate pedal pulses on the right side. Both feet are warm and well-perfused. He has no significant lower extremity swelling. PULMONARY: There is good air exchange bilaterally without wheezing or rales. ABDOMEN: Soft and non-tender with normal pitched bowel sounds.  MUSCULOSKELETAL: There are no major deformities or cyanosis. NEUROLOGIC: No focal weakness or paresthesias are detected. SKIN: There are no ulcers or rashes noted. PSYCHIATRIC: The patient has a normal affect.  DATA:    ARTERIAL DOPPLER STUDY: I have independently interpreted his arterial Doppler study today.  On the right side ABI is 84% with a toe pressure of 93 mmHg.  On the left side there is a triphasic posterior tibial and dorsalis pedis signal with an ABI of 100%.  Toe pressure on the left is 120 mmHg.  DUPLEX: I have independently interpreted the duplex of his bypass graft.  His left popliteal to posterior tibial artery bypass graft is patent with no evidence of stenosis.  There is mild stenosis in the left superficial femoral artery proximal to the bypass.  MEDICAL ISSUES:   PERIPHERAL VASCULAR DISEASE: The patient's left popliteal to posterior tibial artery bypass graft is widely patent.  There is some mild disease in the superficial femoral artery proximally but he has triphasic Doppler signals in the left foot with an ABI of 100%.  Patient is asymptomatic with no claudication or rest pain.   He does have degenerative disc disease in his back with referred pain in the right thigh related to this.  I have ordered a follow-up duplex scan in 1 year and ABIs at that time also.  He knows to call sooner if he has problems.  Deitra Mayo [facility] [phone]

## 2017-10-27 ENCOUNTER — Encounter: Payer: Self-pay | Admitting: Licensed Clinical Social Worker

## 2017-10-27 ENCOUNTER — Encounter: Payer: PPO | Admitting: Genetics

## 2017-10-27 ENCOUNTER — Inpatient Hospital Stay: Payer: PPO | Attending: Genetic Counselor | Admitting: Licensed Clinical Social Worker

## 2017-10-27 ENCOUNTER — Inpatient Hospital Stay: Payer: PPO

## 2017-10-27 ENCOUNTER — Other Ambulatory Visit: Payer: PPO

## 2017-10-27 DIAGNOSIS — Z1379 Encounter for other screening for genetic and chromosomal anomalies: Secondary | ICD-10-CM

## 2017-10-27 DIAGNOSIS — Z8546 Personal history of malignant neoplasm of prostate: Secondary | ICD-10-CM | POA: Insufficient documentation

## 2017-10-27 DIAGNOSIS — Z803 Family history of malignant neoplasm of breast: Secondary | ICD-10-CM

## 2017-10-27 NOTE — Progress Notes (Signed)
REFERRING PROVIDER: [address], [place], [place] [address]  PRIMARY PROVIDER:  Girtha Rm, NP-C  PRIMARY REASON FOR VISIT:  1. Family history of breast cancer in first degree relative   2. Personal history of prostate cancer      HISTORY OF PRESENT ILLNESS:   Joel [name], a 77 y.o. male, was seen for a [facility] cancer genetics consultation at the request of Dr. Carolyne Fiscal due to a personal and family history of cancer.  Joel [name] presents to clinic today to discuss the possibility of a hereditary predisposition to cancer, genetic testing, and to further clarify his future cancer risks, as well as potential cancer risks for family members.   In 2017, at the age of 53, Joel [name] was diagnosed with metastatic prostate cancer, Gleason score 8. This was treated with Degarelix.   MEDICAL HISTORY:  Colonoscopy: yes; normal. Endoscopy: yes; normal.  Alcohol: Stopped drinking in 1995. Cigarettes: Former smoker, quit about 7 years ago.  No radiation exposure.   Past Medical History:  Diagnosis Date  . Atypical chest pain   . CAD (coronary artery disease)    a. Multiple prior evaluations then 05/2012 s/p BMS to OM2. b. Nuc 11/2012 wnl. c. Cath 2015: patent stent, moderate LAD/RCA dz, treated medically.  . Cancer ([place])    Hx: of prostate  . Cataract   . CKD (chronic kidney disease), stage II   . COPD (chronic obstructive pulmonary disease) ([place])   . Cough secondary to angiotensin converting enzyme inhibitor (ACE-I)   . Diabetes mellitus without complication ([place])   . Diverticulosis   . DVT (deep venous thrombosis) ([place])   . Family history of breast cancer in first degree relative   . GERD (gastroesophageal reflux disease)   . Headache(784.0)    Hx: of  . History of blood transfusion    "I've had 2; don't know what it was related to" (11/09/2012)  . HLD (hyperlipidemia)   . HTN (hypertension)   . Iron deficiency anemia   . Microcytic anemia   .  Peripheral vascular disease ([place])    a. L pop-tibial bypass 2011, followed by [facility].  . Personal history of prostate cancer   . Rotator cuff injury    "left arm; never repaired" (11/09/2012)    Past Surgical History:  Procedure Laterality Date  . CARDIOVASCULAR STRESS TEST  Aug. 2014  . CATARACT EXTRACTION W/ INTRAOCULAR LENS IMPLANT Right ~ 2008  . CIRCUMCISION    . COLONOSCOPY    . CORONARY ANGIOPLASTY WITH STENT PLACEMENT  2014   "1" (11/09/2012)  . ILIAC ARTERY ANEURYSM REPAIR    . LEFT HEART CATH  05-31-12  . LEFT HEART CATHETERIZATION WITH CORONARY ANGIOGRAM N/A 06/01/2011   Procedure: LEFT HEART CATHETERIZATION WITH CORONARY ANGIOGRAM;  Surgeon: Hillary Bow, MD;  Location: [facility] CATH LAB;  Service: Cardiovascular;  Laterality: N/A;  . LEFT HEART CATHETERIZATION WITH CORONARY ANGIOGRAM N/A 05/31/2012   Procedure: LEFT HEART CATHETERIZATION WITH CORONARY ANGIOGRAM;  Surgeon: Peter M Martinique, MD;  Location: [facility] CATH LAB;  Service: Cardiovascular;  Laterality: N/A;  . LEFT HEART CATHETERIZATION WITH CORONARY ANGIOGRAM N/A 08/01/2013   Procedure: LEFT HEART CATHETERIZATION WITH CORONARY ANGIOGRAM;  Surgeon: Burnell Blanks, MD;  Location: [facility] CATH LAB;  Service: Cardiovascular;  Laterality: N/A;  . PERCUTANEOUS CORONARY STENT INTERVENTION (PCI-S)  05/31/2012   Procedure: PERCUTANEOUS CORONARY STENT INTERVENTION (PCI-S);  Surgeon: Peter M Martinique, MD;  Location: [facility] CATH LAB;  Service: Cardiovascular;;  .  PR VEIN BYPASS GRAFT,AORTO-FEM-POP Left 10/01/2009   left below knee popliteal artery to posterior tibial artery  . SHOULDER ARTHROSCOPY WITH OPEN ROTATOR CUFF REPAIR AND DISTAL CLAVICLE ACROMINECTOMY Left 01/12/2013   Procedure: LEFT SHOULDER ARTHROSCOPY WITH DEBRIDEMENT OPEN DISTAL CLAVICLE RESECTION ,acromioplastyAND ROTATOR CUFF REPAIR;  Surgeon: Yvette Rack., MD;  Location: [place];  Service: Orthopedics;  Laterality: Left;    Social History   Socioeconomic History  . Marital  status: Widowed    Spouse name: Not on file  . Number of children: Not on file  . Years of education: Not on file  . Highest education level: Not on file  Occupational History  . Occupation: Retired  Scientific laboratory technician  . Financial resource strain: Not on file  . Food insecurity:    Worry: Not on file    Inability: Not on file  . Transportation needs:    Medical: Not on file    Non-medical: Not on file  Tobacco Use  . Smoking status: Former Smoker    Packs/day: 0.00    Types: Cigarettes    Last attempt to quit: 05/26/2011    Years since quitting: 6.4  . Smokeless tobacco: Never Used  Substance and Sexual Activity  . Alcohol use: No  . Drug use: No  . Sexual activity: Not on file  Lifestyle  . Physical activity:    Days per week: Not on file    Minutes per session: Not on file  . Stress: Not on file  Relationships  . Social connections:    Talks on phone: Not on file    Gets together: Not on file    Attends religious service: Not on file    Active member of club or organization: Not on file    Attends meetings of clubs or organizations: Not on file    Relationship status: Not on file  Other Topics Concern  . 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  Lives in [place] by himself.     FAMILY HISTORY:  We obtained a detailed, 4-generation family history.  Significant diagnoses are listed below: Family History  Problem Relation Age of Onset  . Breast cancer Mother        dx 60s  . Hyperlipidemia Mother   . Hypertension Mother   . COPD Father        deceased  . Hyperlipidemia Father   . Hypertension Father   . Hypertension Sister   . Cancer Brother 36       Unknown cancer, possibly stomach  . Hypertension Brother   . Cancer Brother 11       Unknown, possibly lung  . Hypertension Daughter   . Hypertension Son   . Colon cancer Neg Hx        pt is unclear about family hx   Joel [name] has 7 children. One son is in [place]  and he has never had contact with him. Another daughter is in [place]. He also has one son, 60 and a daughter 51 that have the same mother and two sons and a daughter in their late 30's/early 3's that have the same mother. No history of cancer in his children or his grandchildren. Of note, Joel [name] reported that his children do not know he has prostate cancer.   Joel [name] has 4 brothers and 3 sisters. One of his brothers was diagnosed with cancer and passed away in his 41's. Mr. Michie  is unsure what type but believes it could have been stomach cancer. Another brother recently died from cancer at 4 which he believes was lung cancer but is not sure. Another brother died at 69 months and his last brother died at 93 from an unknown cause. A sister also had cancer, she is currently 54. He does not know the type. No cancer history in his nieces and nephews.   JoelCollins's father died of emphysema in his 15's. He had 7 siblings, none with cancer. No cancer history in his paternal cousins. Joel [name] does not have information about his paternal grandparents.    Joel [name] mother was diagnosed in her 29's with breast cancer and died in her 46's. She had 5 brothers and 5 sisters, no cancer history. There is also no cancer in his maternal cousins. His maternal grandparents both died of "old age" in their 44's.   Joel [name] is unaware of previous family history of genetic testing for hereditary cancer risks. Patient's maternal ancestors are of African American descent, and paternal ancestors are of African American descent. There is no reported Ashkenazi Jewish ancestry. There is no known consanguinity.  GENETIC COUNSELING ASSESSMENT: Joel [name] is a 77 y.o. male with a personal and family history which is somewhat suggestive of a Hereditary Cancer Predisposition Syndrome. We, therefore, discussed and recommended the following at today's visit.   DISCUSSION: We reviewed the  characteristics, features and inheritance patterns of hereditary cancer syndromes. We also discussed genetic testing, including the appropriate family members to test, the process of testing, insurance coverage and turn-around-time for results. We discussed the implications of a negative, positive and/or variant of uncertain significant result. We recommended Joel [name] pursue genetic testing for the [facility] Multi-Cancer 84 gene panel.   We discussed that only 5-10% of cancers are associated with a hereditary cancer predisposition syndrome.  One of the most common hereditary cancer syndromes is called Hereditary Breast and Ovarian Cancer (HBOC) syndrome, and it is also associated with prostate cancer. This syndrome is caused by mutations in the BRCA1 and BRCA2 genes.  This syndrome increases an individual's lifetime risk to develop breast, ovarian, pancreatic, and other types of cancer.  There are also many other cancer predisposition syndromes caused by mutations in several other genes.  We discussed that if he is found to have a mutation in one of these genes, it may impact future medical management recommendations such as increased cancer screenings.  A positive result could also have implications for the patient's family members.  A Negative result would mean we were unable to identify a hereditary component to his cancer, but does not rule out the possibility of a hereditary basis for his prostate cancer.  There could be mutations that are undetectable by current technology, or in genes not yet tested or identified to increase cancer risk.    We discussed the potential to find a Variant of Uncertain Significance or VUS.  These are variants that have not yet been identified as pathogenic or benign, and it is unknown if this variant is associated with increased cancer risk or if this is a normal finding.  Most VUS's are reclassified to benign or likely benign.   It should not be used to make medical  management decisions. With time, we suspect the lab will determine the significance of any VUS's identified if any.   Based on Mr. Bagnall personal and family history of cancer, he meets medical criteria for genetic testing. [name] is currently offering  patients who meet certain criteria with prostate cancer free testing for their Multi-Cancer 84 gene panel. Joel [name] meets these criteria and was offered this option.   PLAN:  Despite our recommendation, Mr. Kincaid did not wish to pursue genetic testing at today's visit. Mr. Andres expressed that his children do not know he has prostate cancer and that it would be difficult to try to tell them if his test came back positive. I offered to write a family letter he could send out if he came back positive. He wished to have some time to think about testing and told me to call him back at the end of the month to check in. We understand this decision, and remain available to coordinate genetic testing at any time in the future. We; therefore, recommend Mr. Agcaoili continue to follow the cancer screening guidelines given by his primary healthcare provider.  Lastly, we encouraged Mr. Tapanes to remain in contact with cancer genetics annually so that we can continuously update the family history and inform him of any changes in cancer genetics and testing that may be of benefit for this family.   Mr.  Ake questions were answered to his satisfaction today. Our contact information was provided should additional questions or concerns arise. Thank you for the referral and allowing [place] to share in the care of your patient.   Epimenio Foot, MS Genetic Counselor [email or web address] Phone: [phone]  The patient was seen for a total of 40 minutes in face-to-face genetic counseling.

## 2017-11-01 ENCOUNTER — Ambulatory Visit (INDEPENDENT_AMBULATORY_CARE_PROVIDER_SITE_OTHER): Payer: PPO | Admitting: Medical

## 2017-11-01 VITALS — BP 120/70 | HR 73 | Temp 97.8°F | Resp 16 | Ht 70.0 in | Wt 238.8 lb

## 2017-11-01 DIAGNOSIS — C61 Malignant neoplasm of prostate: Secondary | ICD-10-CM | POA: Diagnosis not present

## 2017-11-01 DIAGNOSIS — M549 Dorsalgia, unspecified: Secondary | ICD-10-CM

## 2017-11-01 DIAGNOSIS — M5410 Radiculopathy, site unspecified: Secondary | ICD-10-CM

## 2017-11-01 MED ORDER — TRAMADOL HCL 50 MG PO TABS: ORAL_TABLET | ORAL | 0 refills | Status: DC

## 2017-11-01 MED ORDER — GABAPENTIN 600 MG PO TABS: 600.0000 mg | ORAL_TABLET | Freq: Two times a day (BID) | ORAL | 0 refills | Status: DC

## 2017-11-01 NOTE — Progress Notes (Signed)
Subjective: Chief Complaint  Patient presents with  . back pain    back pain    Here for 3 week hx/o back pain, having some radiating pain down legs, mostly right.   Went to the [facility] hospital for this, but they were too slow to get to him.  When he finally was seen, was given topical ointment and Gabapentin to take BID.   He says Its hard to get appt with his doctor at the [place].  The Gabapentin isn't helping.   [facility]   Had xray or possibly MRI in past few months for back pain and to see if prostate cancer went to bone.   Is under prostate cancer treatment currently.    [facility] [place] [facility] few months for prostate cancer.  Is active but no particular stretching or exercise regularly.  No recent fever, no weight loss, no numbness, no abdominal pain, no urinary or bowel changes.    Past Medical History:  Diagnosis Date  . Atypical chest pain   . CAD (coronary artery disease)    a. Multiple prior evaluations then 05/2012 s/p BMS to OM2. b. Nuc 11/2012 wnl. c. Cath 2015: patent stent, moderate LAD/RCA dz, treated medically.  . Cancer ([place])    Hx: of prostate  . Cataract   . CKD (chronic kidney disease), stage II   . COPD (chronic obstructive pulmonary disease) ([place])   . Cough secondary to angiotensin converting enzyme inhibitor (ACE-I)   . Diabetes mellitus without complication ([place])   . Diverticulosis   . DVT (deep venous thrombosis) ([place])   . Family history of breast cancer in first degree relative   . GERD (gastroesophageal reflux disease)   . Headache(784.0)    Hx: of  . History of blood transfusion    "I've had 2; don't know what it was related to" (11/09/2012)  . HLD (hyperlipidemia)   . HTN (hypertension)   . Iron deficiency anemia   . Microcytic anemia   . Peripheral vascular disease ([place])    a. L pop-tibial bypass 2011, followed by [facility].  . Personal history of prostate cancer   . Rotator cuff injury    "left arm; never repaired" (11/09/2012)    Current Outpatient Medications on File Prior to Visit  Medication Sig Dispense Refill  . abiraterone Acetate (ZYTIGA) 250 MG tablet Take 1,000 mg by mouth daily. Take on an empty stomach 1 hour before or 2 hours after a meal    . albuterol (PROVENTIL HFA;VENTOLIN HFA) 108 (90 Base) MCG/ACT inhaler Inhale 2 puffs every 4 (four) hours as needed into the lungs for wheezing or shortness of breath.    Marland Kitchen amLODipine (NORVASC) 10 MG tablet TAKE 1 TABLET BY MOUTH ONCE DAILY 30 tablet 2  . aspirin EC 81 MG tablet Take 81 mg by mouth daily.     . brimonidine (ALPHAGAN) 0.2 % ophthalmic solution INT 1 GTT REY BID  0  . ferrous sulfate 325 (65 FE) MG tablet Take 325 mg by mouth daily with breakfast.    . isosorbide mononitrate (IMDUR) 30 MG 24 hr tablet Take 30 mg by mouth daily.    Marland Kitchen losartan-hydrochlorothiazide (HYZAAR) 100-25 MG tablet Take 1 tablet by mouth daily. 30 tablet 11  . metFORMIN (GLUCOPHAGE) 500 MG tablet Take 500 mg by mouth 2 (two) times daily with a meal. Reported on 08/27/2015    . nitroGLYCERIN (NITROSTAT) 0.4 MG SL tablet Place 1 tablet (0.4 mg total) under  the tongue every 5 (five) minutes as needed for chest pain. 25 tablet 2  . omeprazole (PRILOSEC) 20 MG capsule Take 20 mg by mouth 2 (two) times daily before a meal.    . pravastatin (PRAVACHOL) 40 MG tablet Take 1 tablet (40 mg total) by mouth every evening. 30 tablet 11  . predniSONE (DELTASONE) 5 MG tablet Take 5 mg by mouth daily with breakfast.    . tamsulosin (FLOMAX) 0.4 MG CAPS capsule Take 0.4 mg at bedtime by mouth.     Current Facility-Administered Medications on File Prior to Visit  Medication Dose Route Frequency Provider Last Rate Last Dose  . 0.9 %  sodium chloride infusion  500 mL Intravenous Continuous Gatha Mayer, MD       ROS as in subjective   Objective: BP 120/70   Pulse 73   Temp 97.8 F (36.6 C) (Oral)   Resp 16   Ht 5\' 10"  (1.778 m)   Wt 238 lb 12.8 oz (108.3 kg)   SpO2 98%   BMI 34.26 kg/m    Wt Readings from Last 3 Encounters:  11/01/17 238 lb 12.8 oz (108.3 kg)  10/26/17 236 lb (107 kg)  08/24/17 234 lb 6.4 oz (106.3 kg)     Gen: wd, wn, nad Skin: unremarkable Back mild tenderness upper lumbar midline, otherwise nontender, mild pain with range of motion which is mostly full Legs nontender, hips with decreased range of motion worse on the right the hip nontender Legs nontender, negative straight leg raise otherwise relatively normal range of motion No lower extremity edema Legs neurovascularly intact Abdomen: Nontender no obvious mass no rebound or guarding   Assessment: Encounter Diagnoses  Name Primary?  . Back pain, unspecified back location, unspecified back pain laterality, unspecified chronicity Yes  . Radicular pain of lower extremity   . Prostate cancer [facility])      Plan: Continue gabapentin per the [facility], gave short-term refill of tramadol.   We will request imaging records from the [facility].  Offered physical therapy referral but he declines as he owes them money.  Discussed some home exercise program and stretches to do  Follow-up pending records from the [facility] was seen today for back pain.  Diagnoses and all orders for this visit:  Back pain, unspecified back location, unspecified back pain laterality, unspecified chronicity  Radicular pain of lower extremity  Prostate cancer ([place])  Other orders -     gabapentin (NEURONTIN) 600 MG tablet; Take 1 tablet (600 mg total) by mouth 2 (two) times daily. -     traMADol (ULTRAM) 50 MG tablet; take 1 tablet by mouth every 8 hours if needed FOR PAIN

## 2017-11-07 ENCOUNTER — Other Ambulatory Visit: Payer: Self-pay | Admitting: Family Medicine

## 2017-11-09 ENCOUNTER — Telehealth: Payer: Self-pay | Admitting: Licensed Clinical Social Worker

## 2017-11-09 NOTE — Telephone Encounter (Signed)
Called Joel [name] today as he requested to find out his decision regarding genetic testing. He will call back tomorrow at 9:30.

## 2017-11-15 ENCOUNTER — Ambulatory Visit
Admission: RE | Admit: 2017-11-15 | Discharge: 2017-11-15 | Disposition: A | Discharge status: Home / Self Care | Payer: PPO | Source: Ambulatory Visit | Attending: Family Medicine | Admitting: Family Medicine

## 2017-11-15 ENCOUNTER — Ambulatory Visit (INDEPENDENT_AMBULATORY_CARE_PROVIDER_SITE_OTHER): Payer: PPO | Admitting: Family Medicine

## 2017-11-15 ENCOUNTER — Encounter: Payer: Self-pay | Admitting: Family Medicine

## 2017-11-15 VITALS — BP 124/70 | HR 69 | Temp 97.6°F | Wt 233.8 lb

## 2017-11-15 DIAGNOSIS — K5900 Constipation, unspecified: Secondary | ICD-10-CM

## 2017-11-15 DIAGNOSIS — R195 Other fecal abnormalities: Secondary | ICD-10-CM

## 2017-11-15 LAB — HEMOCCULT GUIAC POC 1CARD (OFFICE)

## 2017-11-15 NOTE — Progress Notes (Signed)
   Subjective:    [name], male    DOB: [birth year], 77 y.o.   MRN: [id number]  HPI Complains of a 4-day history of constipation.  He states that normally he has 1 BM per day.  He has not had any change in his physical activities or eating habits.  He did have a colonoscopy done earlier this year which showed 2 small polyps.   Review of Systems     Objective:   Physical Exam Alert and in no distress.  Rectal exam showed no stool in the vault and the stool was guaiac positive.       Assessment & Plan:  Constipation, unspecified constipation type - Plan: DG Abd 1 View, POCT occult blood stool  Stool guaiac positive I will wait to see what the KUB shows.

## 2017-11-16 ENCOUNTER — Telehealth: Payer: Self-pay

## 2017-11-16 NOTE — Telephone Encounter (Signed)
Pt called to ask you a question about the [place]. Pt says you would know what he was talking about . Pt was advised that you were seeing pt  And he says he would like you to call him back some time today. [place]

## 2017-11-16 NOTE — Telephone Encounter (Signed)
Please call him and find out his question so I can help him. Let him know my schedule is packed this afternoon but I want to address his concerns.

## 2017-11-16 NOTE — Telephone Encounter (Signed)
Pt called just wanting to know if we had the notes from the [place]

## 2017-12-07 ENCOUNTER — Encounter: Payer: Self-pay | Admitting: Neurology

## 2017-12-07 ENCOUNTER — Ambulatory Visit (INDEPENDENT_AMBULATORY_CARE_PROVIDER_SITE_OTHER): Payer: PPO | Admitting: Family Medicine

## 2017-12-07 ENCOUNTER — Encounter: Payer: Self-pay | Admitting: Family Medicine

## 2017-12-07 VITALS — BP 130/68 | HR 79 | Temp 97.7°F | Resp 16 | Wt 241.8 lb

## 2017-12-07 DIAGNOSIS — R51 Headache: Secondary | ICD-10-CM | POA: Diagnosis not present

## 2017-12-07 DIAGNOSIS — G8929 Other chronic pain: Secondary | ICD-10-CM

## 2017-12-07 DIAGNOSIS — R519 Headache, unspecified: Secondary | ICD-10-CM

## 2017-12-07 NOTE — Patient Instructions (Addendum)
Try taking Tylenol and staying well hydrated. If you have any new or worsening symptoms such as weakness, dizziness or vision changes then you should be seen right away.  Avoid taking aspirin, BC powder, ibuprofen or advil due to your GI history   You will receive a call from [facility] Neurology.    General Headache Without Cause A headache is pain or discomfort felt around the head or neck area. There are many causes and types of headaches. In some cases, the cause may not be found. Follow these instructions at home: Managing pain  Take over-the-counter and prescription medicines only as told by your doctor.  Lie down in a dark, quiet room when you have a headache.  If directed, apply ice to the head and neck area: ? Put ice in a plastic bag. ? Place a towel between your skin and the bag. ? Leave the ice on for 20 minutes, 2-3 times per day.  Use a heating pad or hot shower to apply heat to the head and neck area as told by your doctor.  Keep lights dim if bright lights bother you or make your headaches worse. Eating and drinking  Eat meals on a regular schedule.  Lessen how much alcohol you drink.  Lessen how much caffeine you drink, or stop drinking caffeine. General instructions  Keep all follow-up visits as told by your doctor. This is important.  Keep a journal to find out if certain things bring on headaches. For example, write down: ? What you eat and drink. ? How much sleep you get. ? Any change to your diet or medicines.  Relax by getting a massage or doing other relaxing activities.  Lessen stress.  Sit up straight. Do not tighten (tense) your muscles.  Do not use tobacco products. This includes cigarettes, chewing tobacco, or e-cigarettes. If you need help quitting, ask your doctor.  Exercise regularly as told by your doctor.  Get enough sleep. This often means 7-9 hours of sleep. Contact a doctor if:  Your symptoms are not helped by medicine.  You have  a headache that feels different than the other headaches.  You feel sick to your stomach (nauseous) or you throw up (vomit).  You have a fever. Get help right away if:  Your headache becomes really bad.  You keep throwing up.  You have a stiff neck.  You have trouble seeing.  You have trouble speaking.  You have pain in the eye or ear.  Your muscles are weak or you lose muscle control.  You lose your balance or have trouble walking.  You feel like you will pass out (faint) or you pass out.  You have confusion. This information is not intended to replace advice given to you by your health care provider. Make sure you discuss any questions you have with your health care provider. Document Released: 01/06/2008 Document Revised: 09/04/2015 Document Reviewed: 07/22/2014 Elsevier Interactive Patient Education  [employer].

## 2017-12-07 NOTE — Progress Notes (Signed)
   Subjective:    [name], male    DOB: [birth year], 77 y.o.   MRN: [id number]  HPI Chief Complaint  Patient presents with  . headache    headache coming and going for 3 days.    Here with complaints of a 3 day history of bilateral headache that has been intermittent but constant today. Pain is dull, non pulsatile. States this headache is similar to headaches in the past.  No aura. Nothing makes his pain worse. BC powder and motrin has improved pain but then it returns. Denies numbness, tingling or weakness.  No URI symptoms.   Denies fever, chills, dizziness, vision changes, tinnitus, chest pain, palpitations, shortness of breath, abdominal pain, N/V/D.  States he has been seeing a headache specialist. They were giving him injections and had him taking gabapentin. He started taking the gabapentin again yesterday for headache as they recommended in the past. States his insurance is no longer covering the specialist he was seeing and he was told he had to get a referral from me for a new headache specialist   Reviewed allergies, medications, past medical, surgical, family, and social history.   Review of Systems Pertinent positives and negatives in the history of present illness.     Objective:   Physical Exam BP 130/68   Pulse 79   Temp 97.7 F (36.5 C) (Oral)   Resp 16   Wt 241 lb 12.8 oz (109.7 kg)   SpO2 99%   BMI 34.69 kg/m   Alert and in no distress. Tympanic membranes and canals are normal. Pharyngeal area is normal. Neck is supple without adenopathy or thyromegaly. Cardiac exam shows a regular sinus rhythm without murmurs or gallops. Lungs are clear to auscultation. PERRLA, EOMs intact. CNs intact. DTRs symmetric and normal. Skin is warm and dry, no pallor. Speech and mood normal.       Assessment & Plan:  Chronic nonintractable headache, unspecified headache type - Plan: Ambulatory referral to Neurology  No acute distress. Exam unremarkable.    Advised him to avoid NSAIDs due to history of gastritis and GI bleeding. He will try Tylenol and continue on gabapentin as recommended by his previous headache specialist.  Refer to [facility] Neurology. He can return here if headache persists or symptoms worsen while awaiting appointment with neurology.

## 2017-12-11 ENCOUNTER — Emergency Department (HOSPITAL_COMMUNITY): Payer: PPO

## 2017-12-11 ENCOUNTER — Emergency Department (HOSPITAL_COMMUNITY)
Admission: EM | Admit: 2017-12-11 | Discharge: 2017-12-11 | Disposition: A | Discharge status: Home / Self Care | Payer: PPO | Attending: Emergency Medicine | Admitting: Emergency Medicine

## 2017-12-11 ENCOUNTER — Encounter (HOSPITAL_COMMUNITY): Payer: Self-pay | Admitting: Emergency Medicine

## 2017-12-11 ENCOUNTER — Other Ambulatory Visit: Payer: Self-pay

## 2017-12-11 DIAGNOSIS — R51 Headache: Secondary | ICD-10-CM | POA: Diagnosis not present

## 2017-12-11 DIAGNOSIS — I129 Hypertensive chronic kidney disease with stage 1 through stage 4 chronic kidney disease, or unspecified chronic kidney disease: Secondary | ICD-10-CM | POA: Insufficient documentation

## 2017-12-11 DIAGNOSIS — Z87891 Personal history of nicotine dependence: Secondary | ICD-10-CM | POA: Diagnosis not present

## 2017-12-11 DIAGNOSIS — G44209 Tension-type headache, unspecified, not intractable: Secondary | ICD-10-CM | POA: Diagnosis not present

## 2017-12-11 DIAGNOSIS — I2510 Atherosclerotic heart disease of native coronary artery without angina pectoris: Secondary | ICD-10-CM | POA: Diagnosis not present

## 2017-12-11 DIAGNOSIS — J449 Chronic obstructive pulmonary disease, unspecified: Secondary | ICD-10-CM | POA: Diagnosis not present

## 2017-12-11 DIAGNOSIS — E119 Type 2 diabetes mellitus without complications: Secondary | ICD-10-CM | POA: Diagnosis not present

## 2017-12-11 DIAGNOSIS — N182 Chronic kidney disease, stage 2 (mild): Secondary | ICD-10-CM | POA: Diagnosis not present

## 2017-12-11 MED ORDER — METOCLOPRAMIDE HCL 10 MG PO TABS
10.0000 mg | ORAL_TABLET | Freq: Once | ORAL | Status: AC
  Administered 2017-12-11: 10 mg via ORAL
  Filled 2017-12-11: qty 1

## 2017-12-11 MED ORDER — METOCLOPRAMIDE HCL 10 MG PO TABS: 10.0000 mg | ORAL_TABLET | Freq: Three times a day (TID) | ORAL | 0 refills | Status: DC | PRN

## 2017-12-11 MED ORDER — KETOROLAC TROMETHAMINE 30 MG/ML IJ SOLN
15.0000 mg | Freq: Once | INTRAMUSCULAR | Status: AC
  Administered 2017-12-11: 15 mg via INTRAMUSCULAR
  Filled 2017-12-11: qty 1

## 2017-12-11 NOTE — ED Notes (Signed)
Patient transported to CT 

## 2017-12-11 NOTE — Discharge Instructions (Signed)
You have been seen in the Emergency Department (ED) for a headache.  Please use Tylenol as needed for symptoms, but only as written on the box. You may take the Reglan along with Tylenol for headache.  As we have discussed, please follow up with your primary care doctor as soon as possible regarding todays Emergency Department (ED) visit and your headache symptoms.    Call your doctor or return to the ED if you have a worsening headache, sudden and severe headache, confusion, slurred speech, facial droop, weakness or numbness in any arm or leg, extreme fatigue, vision problems, or other symptoms that concern you.

## 2017-12-11 NOTE — ED Triage Notes (Addendum)
Pt states he has had intermittent headaches that change location for a few weeks. [facility] and [facility] and they cannot [facility] him until October. Pain was in back of neck, now is in front of head. No neurological deficits. Pt occasionally has ringing in ears too. Pt also has nasal congestion. No visual disturbances. No recent head trauma or injuries.

## 2017-12-11 NOTE — ED Provider Notes (Signed)
Emergency Department Provider Note   I have reviewed the triage vital signs and the nursing notes.   HISTORY  Chief Complaint Headache   HPI Joel [name] is a 77 y.o. male CAD, COPD, DM, HLD, and HTN presents to the emergency department for evaluation of intermittent headache over the past 2 to 3 weeks.  The patient describes pain which is worse with getting up and moving around.  He denies any sudden onset, maximal intensity headache symptoms.  No vision changes.  No weakness or numbness.  He states the headache is refractory to Tylenol.  He is able to sleep and the headache tends to go away but then returns upon waking.  He had seen a neurologist in the past and was getting injections which improved his symptoms but his insurance changed and is no longer able to see this person.  He denies any photophobia.  No fevers or chills. No head injury.   Past Medical History:  Diagnosis Date  . Atypical chest pain   . CAD (coronary artery disease)    a. Multiple prior evaluations then 05/2012 s/p BMS to OM2. b. Nuc 11/2012 wnl. c. Cath 2015: patent stent, moderate LAD/RCA dz, treated medically.  . Cancer ([place])    Hx: of prostate  . Cataract   . CKD (chronic kidney disease), stage II   . COPD (chronic obstructive pulmonary disease) ([place])   . Cough secondary to angiotensin converting enzyme inhibitor (ACE-I)   . Diabetes mellitus without complication ([place])   . Diverticulosis   . DVT (deep venous thrombosis) ([place])   . Family history of breast cancer in first degree relative   . GERD (gastroesophageal reflux disease)   . Headache(784.0)    Hx: of  . History of blood transfusion    "I've had 2; don't know what it was related to" (11/09/2012)  . HLD (hyperlipidemia)   . HTN (hypertension)   . Iron deficiency anemia   . Microcytic anemia   . Peripheral vascular disease ([place])    a. L pop-tibial bypass 2011, followed by [facility].  . Personal history of prostate cancer   . Rotator cuff  injury    "left arm; never repaired" (11/09/2012)    Patient Active Problem List   Diagnosis Date Noted  . Chronic nonintractable headache 12/07/2017  . Back pain 11/01/2017  . Radicular pain of lower extremity 11/01/2017  . Prostate cancer ([place]) 11/01/2017  . Family history of breast cancer in first degree relative   . Personal history of prostate cancer   . Elliptocytosis on peripheral blood smear ([place]) 09/13/2016  . Prediabetes 03/22/2016  . PVD (peripheral vascular disease) with claudication ([place]) 11/15/2012  . Chest pain, localized 11/10/2012  . Hypokalemia 11/10/2012  . Bradycardia 11/09/2012  . Thrombocytopenia ([place]) 06/01/2012  . Microcytic anemia   . Atherosclerosis of native arteries of the extremities with intermittent claudication 06/16/2011  . Stable angina ([place]) 06/11/2011  . CAD (coronary artery disease)   . Chest pain, atypical 05/30/2011  . Iron deficiency anemia 10/03/2009  . DIVERTICULOSIS OF COLON 10/03/2009  . WEIGHT LOSS, ABNORMAL 10/03/2009  . CHEST PAIN UNSPECIFIED 09/13/2008  . Headache(784.0) 08/19/2008  . TIA 06/05/2008  . SHOULDER PAIN, LEFT 10/11/2007  . COUGH DUE TO ACE INHIBITORS 05/07/2007  . History of tobacco use disorder 04/25/2007  . Hyperlipidemia 04/21/2007  . Essential hypertension 04/21/2007  . GERD 04/21/2007    Past Surgical History:  Procedure Laterality Date  . CARDIOVASCULAR STRESS TEST  Aug. 2014  .  CATARACT EXTRACTION W/ INTRAOCULAR LENS IMPLANT Right ~ 2008  . CIRCUMCISION    . COLONOSCOPY    . CORONARY ANGIOPLASTY WITH STENT PLACEMENT  2014   "1" (11/09/2012)  . ILIAC ARTERY ANEURYSM REPAIR    . LEFT HEART CATH  05-31-12  . LEFT HEART CATHETERIZATION WITH CORONARY ANGIOGRAM N/A 06/01/2011   Procedure: LEFT HEART CATHETERIZATION WITH CORONARY ANGIOGRAM;  Surgeon: Hillary Bow, MD;  Location[facility] CATH LAB;  Service: Cardiovascular;  Laterality: N/A;  . LEFT HEART CATHETERIZATION WITH CORONARY ANGIOGRAM N/A 05/31/2012    Procedure: LEFT HEART CATHETERIZATION WITH CORONARY ANGIOGRAM;  Surgeon: Peter M Martinique, MD;  Location: [facility] CATH LAB;  Service: Cardiovascular;  Laterality: N/A;  . LEFT HEART CATHETERIZATION WITH CORONARY ANGIOGRAM N/A 08/01/2013   Procedure: LEFT HEART CATHETERIZATION WITH CORONARY ANGIOGRAM;  Surgeon: Burnell Blanks, MD;  Location: [facility] CATH LAB;  Service: Cardiovascular;  Laterality: N/A;  . PERCUTANEOUS CORONARY STENT INTERVENTION (PCI-S)  05/31/2012   Procedure: PERCUTANEOUS CORONARY STENT INTERVENTION (PCI-S);  Surgeon: Peter M Martinique, MD;  Location: [facility] CATH LAB;  Service: Cardiovascular;;  . PR VEIN BYPASS GRAFT,AORTO-FEM-POP Left 10/01/2009   left below knee popliteal artery to posterior tibial artery  . SHOULDER ARTHROSCOPY WITH OPEN ROTATOR CUFF REPAIR AND DISTAL CLAVICLE ACROMINECTOMY Left 01/12/2013   Procedure: LEFT SHOULDER ARTHROSCOPY WITH DEBRIDEMENT OPEN DISTAL CLAVICLE RESECTION ,acromioplastyAND ROTATOR CUFF REPAIR;  Surgeon: Yvette Rack., MD;  Location: [place];  Service: Orthopedics;  Laterality: Left;    Allergies Topiramate; Atenolol; Lisinopril; and Tramadol-acetaminophen  Family History  Problem Relation Age of Onset  . Breast cancer Mother        dx 25s  . Hyperlipidemia Mother   . Hypertension Mother   . COPD Father        deceased  . Hyperlipidemia Father   . Hypertension Father   . Hypertension Sister   . Cancer Brother 71       Unknown cancer, possibly stomach  . Hypertension Brother   . Cancer Brother 37       Unknown, possibly lung  . Hypertension Daughter   . Hypertension Son   . Colon cancer Neg Hx        pt is unclear about family hx    Social History Social History   Tobacco Use  . Smoking status: Former Smoker    Packs/day: 0.00    Types: Cigarettes    Last attempt to quit: 05/26/2011    Years since quitting: 6.5  . Smokeless tobacco: Never Used  Substance Use Topics  . Alcohol use: No  . Drug use: No    Review of  Systems  Constitutional: No fever/chills Eyes: No visual changes. ENT: No sore throat. Cardiovascular: Denies chest pain. Respiratory: Denies shortness of breath. Gastrointestinal: No abdominal pain.  No nausea, no vomiting.  No diarrhea.  No constipation. Genitourinary: Negative for dysuria. Musculoskeletal: Negative for back pain. Skin: Negative for rash. Neurological: Negative for focal weakness or numbness. Positive HA.   10-point ROS otherwise negative.  ____________________________________________   PHYSICAL EXAM:  VITAL SIGNS: ED Triage Vitals  Enc Vitals Group     BP 12/11/17 1435 140/77     Pulse Rate 12/11/17 1435 67     Resp 12/11/17 1435 14     Temp 12/11/17 1435 97.7 F (36.5 C)     Temp Source 12/11/17 1435 Oral     SpO2 12/11/17 1435 99 %     Weight 12/11/17 1436 241 lb (109.3 kg)  Height 12/11/17 1436 5\' 10"  (1.778 m)     Pain Score 12/11/17 1438 8   Constitutional: Alert and oriented. Well appearing and in no acute distress. Eyes: Conjunctivae are normal. PERRL. EOMI. Head: Atraumatic. Nose: No congestion/rhinnorhea. Mouth/Throat: Mucous membranes are moist.  Oropharynx non-erythematous. Neck: No stridor.   Cardiovascular: Normal rate, regular rhythm. Good peripheral circulation. Grossly normal heart sounds.   Respiratory: Normal respiratory effort.  No retractions. Lungs CTAB. Gastrointestinal: Soft and nontender. No distention.  Musculoskeletal: No lower extremity tenderness nor edema. No gross deformities of extremities. Neurologic:  Normal speech and language. No gross focal neurologic deficits are appreciated. CN exam 2-12 negative.  Skin:  Skin is warm, dry and intact. No rash noted.  ____________________________________________  RADIOLOGY  Ct Head Wo Contrast  Result Date: 12/11/2017 CLINICAL DATA:  Patient with intermittent headaches. EXAM: CT HEAD WITHOUT CONTRAST TECHNIQUE: Contiguous axial images were obtained from the base of the  skull through the vertex without intravenous contrast. COMPARISON:  Brain CT 05/04/2014 FINDINGS: Brain: Ventricles and sulci are appropriate for patient's age. Periventricular and subcortical white matter hypodensity compatible with chronic microvascular ischemic changes. Re-demonstrated old infarcts within the right frontal and temporal lobes. No evidence for acute cortically based infarct, intracranial hemorrhage, mass lesion or mass-effect. Vascular: Internal carotid arterial vascular calcifications. Skull: Intact. Sinuses/Orbits: Mucosal thickening involving the ethmoid air cells and frontal sinus. Mastoid air cells unremarkable. Other: None. IMPRESSION: No acute intracranial process. 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 Electronically Signed   By: Lovey Newcomer M.D.   On: 12/11/2017 17:31    ____________________________________________   PROCEDURES  Procedure(s) performed:   Procedures  None  ____________________________________________   INITIAL IMPRESSION / ASSESSMENT AND PLAN / ED COURSE  Pertinent labs & imaging results that were available during my care of the patient were reviewed by me and considered in my medical decision making (see chart for details).  Patient with remote history of similar headaches presents to the emergency department with intermittent headache symptoms over the past 2 to 3 weeks.  He has been unable to see a neurologist soon and so presents to the emergency department.  He has a normal neurological exam.  Treatment low suspicion for subarachnoid hemorrhage.  The patient does have a history of prostate cancer and given the return and persistence of his headache I plan to obtain a CT scan of the head.  Will treat headache symptoms in the emergency department and reassess.  05:40 PM CT head is negative for acute findings.  I do see the area of old infarct.  Patient's headache improved with medication given in the emergency department.  Advised that he  take Tylenol at home as needed for headache and will prescribe Reglan to also take for headache symptoms and/or nausea.  Advised he can drink small amount of caffeine with headaches to try and improve symptoms.  He will call the neurologist and schedule the soonest available appointment for continued follow-up.  At this time, I do not feel there is any life-threatening condition present. I have reviewed and discussed all results (EKG, imaging, lab, urine as appropriate), exam findings with patient. I have reviewed nursing notes and appropriate previous records.  I feel the patient is safe to be discharged home without further emergent workup. Discussed usual and customary return precautions. Patient and family (if present) verbalize understanding and are comfortable with this plan.  Patient will follow-up with their primary care provider. If they do not have a primary care provider,  information for follow-up has been provided to them. All questions have been answered.  ____________________________________________  FINAL CLINICAL IMPRESSION(S) / ED DIAGNOSES  Final diagnoses:  Tension headache     MEDICATIONS GIVEN DURING THIS VISIT:  Medications  ketorolac (TORADOL) 30 MG/ML injection 15 mg (15 mg Intramuscular Given 12/11/17 1611)  metoCLOPramide (REGLAN) tablet 10 mg (10 mg Oral Given 12/11/17 1611)     NEW OUTPATIENT MEDICATIONS STARTED DURING THIS VISIT:  New Prescriptions   METOCLOPRAMIDE (REGLAN) 10 MG TABLET    Take 1 tablet (10 mg total) by mouth every 8 (eight) hours as needed for nausea (and headache symptoms).    Note:  This document was prepared using Dragon voice recognition software and may include unintentional dictation errors.  Nanda Quinton, MD Emergency Medicine    Garin Mata, [facility], MD 12/11/17 [phone]

## 2017-12-23 ENCOUNTER — Other Ambulatory Visit: Payer: Self-pay

## 2017-12-23 ENCOUNTER — Emergency Department (HOSPITAL_COMMUNITY)
Admission: EM | Admit: 2017-12-23 | Discharge: 2017-12-24 | Disposition: A | Discharge status: Home / Self Care | Payer: PPO | Attending: Emergency Medicine | Admitting: Emergency Medicine

## 2017-12-23 ENCOUNTER — Encounter (HOSPITAL_COMMUNITY): Payer: Self-pay | Admitting: Emergency Medicine

## 2017-12-23 DIAGNOSIS — Z79899 Other long term (current) drug therapy: Secondary | ICD-10-CM | POA: Insufficient documentation

## 2017-12-23 DIAGNOSIS — N182 Chronic kidney disease, stage 2 (mild): Secondary | ICD-10-CM | POA: Insufficient documentation

## 2017-12-23 DIAGNOSIS — Z87891 Personal history of nicotine dependence: Secondary | ICD-10-CM | POA: Insufficient documentation

## 2017-12-23 DIAGNOSIS — J449 Chronic obstructive pulmonary disease, unspecified: Secondary | ICD-10-CM | POA: Insufficient documentation

## 2017-12-23 DIAGNOSIS — Z955 Presence of coronary angioplasty implant and graft: Secondary | ICD-10-CM | POA: Insufficient documentation

## 2017-12-23 DIAGNOSIS — I129 Hypertensive chronic kidney disease with stage 1 through stage 4 chronic kidney disease, or unspecified chronic kidney disease: Secondary | ICD-10-CM | POA: Insufficient documentation

## 2017-12-23 DIAGNOSIS — Z7982 Long term (current) use of aspirin: Secondary | ICD-10-CM | POA: Diagnosis not present

## 2017-12-23 DIAGNOSIS — G43901 Migraine, unspecified, not intractable, with status migrainosus: Secondary | ICD-10-CM | POA: Insufficient documentation

## 2017-12-23 DIAGNOSIS — I2510 Atherosclerotic heart disease of native coronary artery without angina pectoris: Secondary | ICD-10-CM | POA: Diagnosis not present

## 2017-12-23 DIAGNOSIS — R51 Headache: Secondary | ICD-10-CM | POA: Diagnosis present

## 2017-12-23 NOTE — ED Triage Notes (Signed)
Pt reports headache starting 8 pm. Pt reports being seen 2 weeks ago for same, reports the headache never fully went away. Pt denies any change in vision, weakness, photosensitivity, near-syncope. Pt states sometimes his upper lip feels funny. No drift noted to any extremity, no facial droop, no slurred speech.

## 2017-12-24 MED ORDER — KETOROLAC TROMETHAMINE 30 MG/ML IJ SOLN
15.0000 mg | Freq: Once | INTRAMUSCULAR | Status: AC
  Administered 2017-12-24: 15 mg via INTRAVENOUS
  Filled 2017-12-24: qty 1

## 2017-12-24 MED ORDER — SODIUM CHLORIDE 0.9 % IV BOLUS
500.0000 mL | Freq: Once | INTRAVENOUS | Status: AC
  Administered 2017-12-24: 500 mL via INTRAVENOUS

## 2017-12-24 MED ORDER — PROCHLORPERAZINE MALEATE 10 MG PO TABS: 10.0000 mg | ORAL_TABLET | Freq: Two times a day (BID) | ORAL | 0 refills | Status: DC | PRN

## 2017-12-24 MED ORDER — DEXAMETHASONE SODIUM PHOSPHATE 10 MG/ML IJ SOLN
10.0000 mg | Freq: Once | INTRAMUSCULAR | Status: AC
  Administered 2017-12-24: 10 mg via INTRAVENOUS
  Filled 2017-12-24: qty 1

## 2017-12-24 MED ORDER — PROCHLORPERAZINE EDISYLATE 10 MG/2ML IJ SOLN
10.0000 mg | Freq: Once | INTRAMUSCULAR | Status: AC
  Administered 2017-12-24: 10 mg via INTRAVENOUS
  Filled 2017-12-24: qty 2

## 2017-12-24 NOTE — ED Provider Notes (Signed)
[facility] EMERGENCY DEPARTMENT Provider Note   CSN: [id number] Arrival date & time: 12/23/17  2213     History   Chief Complaint Chief Complaint  Patient presents with  . Migraine    HPI Joel [name] is a 77 y.o. male.  Patient presents to the emergency department for evaluation of headache.  Patient has a long history of headaches and migraines, previously controlled with Botox injections by his neurologist.  He reports that the insurance company stopped covering his neurologist and he has not been able to see anybody.  He has an appointment for a new neurologist in October, but continues to have headaches.  When he was here 2 weeks ago, he received medication that eased off the headache, but he has been having waxing and waning headaches since.  He has been taking Tylenol and the prescribed Reglan.  Headache improved but then comes back.  Tonight the headache worsened so he presented for treatment.  He denies any visual change, denies jaw claudication.     Past Medical History:  Diagnosis Date  . Atypical chest pain   . CAD (coronary artery disease)    a. Multiple prior evaluations then 05/2012 s/p BMS to OM2. b. Nuc 11/2012 wnl. c. Cath 2015: patent stent, moderate LAD/RCA dz, treated medically.  . Cancer ([place])    Hx: of prostate  . Cataract   . CKD (chronic kidney disease), stage II   . COPD (chronic obstructive pulmonary disease) ([place])   . Cough secondary to angiotensin converting enzyme inhibitor (ACE-I)   . Diabetes mellitus without complication ([place])   . Diverticulosis   . DVT (deep venous thrombosis) ([place])   . Family history of breast cancer in first degree relative   . GERD (gastroesophageal reflux disease)   . Headache(784.0)    Hx: of  . History of blood transfusion    "I've had 2; don't know what it was related to" (11/09/2012)  . HLD (hyperlipidemia)   . HTN (hypertension)   . Iron deficiency anemia   . Microcytic anemia   .  Peripheral vascular disease ([place])    a. L pop-tibial bypass 2011, followed by [facility].  . Personal history of prostate cancer   . Rotator cuff injury    "left arm; never repaired" (11/09/2012)    Patient Active Problem List   Diagnosis Date Noted  . Chronic nonintractable headache 12/07/2017  . Back pain 11/01/2017  . Radicular pain of lower extremity 11/01/2017  . Prostate cancer ([place]) 11/01/2017  . Family history of breast cancer in first degree relative   . Personal history of prostate cancer   . Elliptocytosis on peripheral blood smear ([place]) 09/13/2016  . Prediabetes 03/22/2016  . PVD (peripheral vascular disease) with claudication ([place]) 11/15/2012  . Chest pain, localized 11/10/2012  . Hypokalemia 11/10/2012  . Bradycardia 11/09/2012  . Thrombocytopenia ([place]) 06/01/2012  . Microcytic anemia   . Atherosclerosis of native arteries of the extremities with intermittent claudication 06/16/2011  . Stable angina ([place]) 06/11/2011  . CAD (coronary artery disease)   . Chest pain, atypical 05/30/2011  . Iron deficiency anemia 10/03/2009  . DIVERTICULOSIS OF COLON 10/03/2009  . WEIGHT LOSS, ABNORMAL 10/03/2009  . CHEST PAIN UNSPECIFIED 09/13/2008  . Headache(784.0) 08/19/2008  . TIA 06/05/2008  . SHOULDER PAIN, LEFT 10/11/2007  . COUGH DUE TO ACE INHIBITORS 05/07/2007  . History of tobacco use disorder 04/25/2007  . Hyperlipidemia 04/21/2007  . Essential hypertension 04/21/2007  . GERD 04/21/2007  Past Surgical History:  Procedure Laterality Date  . CARDIOVASCULAR STRESS TEST  Aug. 2014  . CATARACT EXTRACTION W/ INTRAOCULAR LENS IMPLANT Right ~ 2008  . CIRCUMCISION    . COLONOSCOPY    . CORONARY ANGIOPLASTY WITH STENT PLACEMENT  2014   "1" (11/09/2012)  . ILIAC ARTERY ANEURYSM REPAIR    . LEFT HEART CATH  05-31-12  . LEFT HEART CATHETERIZATION WITH CORONARY ANGIOGRAM N/A 06/01/2011   Procedure: LEFT HEART CATHETERIZATION WITH CORONARY ANGIOGRAM;  Surgeon: Hillary Bow, MD;   Location: [facility] CATH LAB;  Service: Cardiovascular;  Laterality: N/A;  . LEFT HEART CATHETERIZATION WITH CORONARY ANGIOGRAM N/A 05/31/2012   Procedure: LEFT HEART CATHETERIZATION WITH CORONARY ANGIOGRAM;  Surgeon: Peter M Martinique, MD;  Location: [facility] CATH LAB;  Service: Cardiovascular;  Laterality: N/A;  . LEFT HEART CATHETERIZATION WITH CORONARY ANGIOGRAM N/A 08/01/2013   Procedure: LEFT HEART CATHETERIZATION WITH CORONARY ANGIOGRAM;  Surgeon: Burnell Blanks, MD;  Location: [facility] CATH LAB;  Service: Cardiovascular;  Laterality: N/A;  . PERCUTANEOUS CORONARY STENT INTERVENTION (PCI-S)  05/31/2012   Procedure: PERCUTANEOUS CORONARY STENT INTERVENTION (PCI-S);  Surgeon: Peter M Martinique, MD;  Location: [facility] CATH LAB;  Service: Cardiovascular;;  . PR VEIN BYPASS GRAFT,AORTO-FEM-POP Left 10/01/2009   left below knee popliteal artery to posterior tibial artery  . SHOULDER ARTHROSCOPY WITH OPEN ROTATOR CUFF REPAIR AND DISTAL CLAVICLE ACROMINECTOMY Left 01/12/2013   Procedure: LEFT SHOULDER ARTHROSCOPY WITH DEBRIDEMENT OPEN DISTAL CLAVICLE RESECTION ,acromioplastyAND ROTATOR CUFF REPAIR;  Surgeon: Yvette Rack., MD;  Location: [facility];  Service: Orthopedics;  Laterality: Left;        Home Medications    Prior to Admission medications   Medication Sig Start Date End Date Taking? Authorizing Provider  abiraterone Acetate (ZYTIGA) 250 MG tablet Take 1,000 mg by mouth daily before breakfast. Take on an empty stomach 1 hour before or 2 hours after a meal    Yes [provider]  albuterol (PROVENTIL HFA;VENTOLIN HFA) 108 (90 Base) MCG/ACT inhaler Inhale 2 puffs every 4 (four) hours as needed into the lungs for wheezing or shortness of breath.   Yes [provider]  amLODipine (NORVASC) 10 MG tablet TAKE 1 TABLET BY MOUTH ONCE DAILY Patient taking differently: Take 10 mg by mouth daily.  11/07/17  Yes Henson, Vickie L, NP-C  aspirin EC 81 MG tablet Take 81 mg by mouth daily.    Yes [provider]  brimonidine (ALPHAGAN) 0.2 % ophthalmic solution Place 1 drop into the right eye 2 (two) times daily.  09/13/17  Yes [provider]  diclofenac sodium (VOLTAREN) 1 % GEL Apply 2 g topically daily as needed (pain).   Yes [provider]  ferrous sulfate 325 (65 FE) MG tablet Take 325 mg by mouth every Monday, Wednesday, and Friday.    Yes [provider]  gabapentin (NEURONTIN) 100 MG capsule Take 100 mg by mouth daily as needed (headache).   Yes [provider]  gabapentin (NEURONTIN) 600 MG tablet Take 1 tablet (600 mg total) by mouth 2 (two) times daily. 11/01/17  Yes Tysinger, Camelia Eng, PA-C  isosorbide mononitrate (IMDUR) 30 MG 24 hr tablet Take 30 mg by mouth at bedtime.    Yes [provider]  losartan-hydrochlorothiazide (HYZAAR) 100-25 MG tablet Take 1 tablet by mouth daily. 06/06/17  Yes Imogene Burn, PA-C  metFORMIN (GLUCOPHAGE) 500 MG tablet Take 500 mg by mouth 2 (two) times daily with a meal. Reported on 08/27/2015  Yes [provider]  metoCLOPramide (REGLAN) 10 MG tablet Take 1 tablet (10 mg total) by mouth every 8 (eight) hours as needed for nausea (and headache symptoms). 12/11/17  Yes Long, Wonda Olds, MD  Multiple Vitamin (MULTIVITAMIN WITH MINERALS) TABS tablet Take 1 tablet by mouth See admin instructions. Take one tablet by mouth every Tuesday, Thursday, Saturday morning   Yes [provider]  nitroGLYCERIN (NITROSTAT) 0.4 MG SL tablet Place 1 tablet (0.4 mg total) under the tongue every 5 (five) minutes as needed for chest pain. 12/08/15  Yes Imogene Burn, PA-C  omeprazole (PRILOSEC) 20 MG capsule Take 20 mg by mouth 2 (two) times daily before a meal.   Yes [provider]  pravastatin (PRAVACHOL) 40 MG tablet Take 1 tablet (40 mg total) by mouth every evening. Patient taking differently: Take 40 mg by mouth at bedtime.  08/01/17  Yes Burnell Blanks, MD  predniSONE (DELTASONE) 5 MG  tablet Take 5 mg by mouth daily before breakfast.    Yes [provider]  tamsulosin (FLOMAX) 0.4 MG CAPS capsule Take 0.4 mg at bedtime by mouth.   Yes [provider]  traMADol (ULTRAM) 50 MG tablet take 1 tablet by mouth every 8 hours if needed FOR PAIN Patient taking differently: Take 50 mg by mouth every 8 (eight) hours as needed (pain).  11/01/17  Yes Tysinger, Camelia Eng, PA-C  prochlorperazine (COMPAZINE) 10 MG tablet Take 1 tablet (10 mg total) by mouth 2 (two) times daily as needed (headache). 12/24/17   Orpah Greek, MD    Family History Family History  Problem Relation Age of Onset  . Breast cancer Mother        dx 45s  . Hyperlipidemia Mother   . Hypertension Mother   . COPD Father        deceased  . Hyperlipidemia Father   . Hypertension Father   . Hypertension Sister   . Cancer Brother 44       Unknown cancer, possibly stomach  . Hypertension Brother   . Cancer Brother 41       Unknown, possibly lung  . Hypertension Daughter   . Hypertension Son   . Colon cancer Neg Hx        pt is unclear about family hx    Social History Social History   Tobacco Use  . Smoking status: Former Smoker    Packs/day: 0.00    Types: Cigarettes    Last attempt to quit: 05/26/2011    Years since quitting: 6.5  . Smokeless tobacco: Never Used  Substance Use Topics  . Alcohol use: No  . Drug use: No     Allergies   Topiramate; Atenolol; Lisinopril; and Tramadol-acetaminophen   Review of Systems Review of Systems  Neurological: Positive for headaches.  All other systems reviewed and are negative.    Physical Exam Updated Vital Signs BP (!) 163/73   Pulse 70   Temp 98 F (36.7 C) (Oral)   Resp 16   Ht 5\' 10"  (1.778 m)   Wt 109.3 kg   SpO2 (!) 84%   BMI 34.58 kg/m   Physical Exam  Constitutional: He is oriented to person, place, and time. He appears well-developed and well-nourished. No distress.  HENT:  Head: Normocephalic and  atraumatic.  Right Ear: Hearing normal.  Left Ear: Hearing normal.  Nose: Nose normal.  Mouth/Throat: Oropharynx is clear and moist and mucous membranes are normal.  Eyes: Pupils are equal,  round, and reactive to light. Conjunctivae and EOM are normal.  Neck: Normal range of motion. Neck supple.  Cardiovascular: Regular rhythm, S1 normal and S2 normal. Exam reveals no gallop and no friction rub.  No murmur heard. Pulmonary/Chest: Effort normal and breath sounds normal. No respiratory distress. He exhibits no tenderness.  Abdominal: Soft. Normal appearance and bowel sounds are normal. There is no hepatosplenomegaly. There is no tenderness. There is no rebound, no guarding, no tenderness at McBurney's point and negative Murphy's sign. No hernia.  Musculoskeletal: Normal range of motion.  Neurological: He is alert and oriented to person, place, and time. He has normal strength. No cranial nerve deficit or sensory deficit. Coordination normal. GCS eye subscore is 4. GCS verbal subscore is 5. GCS motor subscore is 6.  Skin: Skin is warm, dry and intact. No rash noted. No cyanosis.  Psychiatric: He has a normal mood and affect. His speech is normal and behavior is normal. Thought content normal.  Nursing note and vitals reviewed.    ED Treatments / Results  Labs (all labs ordered are listed, but only abnormal results are displayed) Labs Reviewed - No data to display  EKG None  Radiology No results found.  Procedures Procedures (including critical care time)  Medications Ordered in ED Medications  sodium chloride 0.9 % bolus 500 mL (0 mLs Intravenous Stopped 12/24/17 0424)  dexamethasone (DECADRON) injection 10 mg (10 mg Intravenous Given 12/24/17 0250)  ketorolac (TORADOL) 30 MG/ML injection 15 mg (15 mg Intravenous Given 12/24/17 0250)  prochlorperazine (COMPAZINE) injection 10 mg (10 mg Intravenous Given 12/24/17 0251)     Initial Impression / Assessment and Plan / ED Course  I  have reviewed the triage vital signs and the nursing notes.  Pertinent labs & imaging results that were available during my care of the patient were reviewed by me and considered in my medical decision making (see chart for details).     Patient presents to the emergency department for evaluation of headache.  Patient is a chronic headache suffer.  He has previously been treated by neurology with various treatments including Botox injections.  He reports that that helped, but his insurance no longer covers his neurologist.  He has been trying to get into a new neurologist, but does not have an appointment until next month.  He is on the waiting list as well.  Patient presents with headache that gradually worsened tonight.  It is similar to the headaches he has had in the past.  No sudden onset of pain, no neurologic features that would be concerning for a new intracranial process.  This is consistent with his chronic headaches.  He has not had any vision change or jaw claudication, no suspicion for temporal arteritis.  Patient administered Toradol, Decadron, Compazine and has resolution of his headache.  This is consistent with migraine.  Final Clinical Impressions(s) / ED Diagnoses   Final diagnoses:  Migraine with status migrainosus, not intractable, unspecified migraine type    ED Discharge Orders         Ordered    prochlorperazine (COMPAZINE) 10 MG tablet  2 times daily PRN     12/24/17 0639           Orpah Greek, MD 12/24/17 [phone]

## 2017-12-26 ENCOUNTER — Telehealth: Payer: Self-pay | Admitting: Family Medicine

## 2017-12-26 NOTE — Telephone Encounter (Signed)
Received requested records from [facility] not recent [facility]. Sending back for review.

## 2018-01-05 ENCOUNTER — Encounter: Payer: Self-pay | Admitting: Family Medicine

## 2018-01-11 ENCOUNTER — Encounter: Payer: Self-pay | Admitting: Internal Medicine

## 2018-01-18 DIAGNOSIS — H11153 Pinguecula, bilateral: Secondary | ICD-10-CM | POA: Diagnosis not present

## 2018-01-18 DIAGNOSIS — Z9849 Cataract extraction status, unspecified eye: Secondary | ICD-10-CM | POA: Diagnosis not present

## 2018-01-18 DIAGNOSIS — H47231 Glaucomatous optic atrophy, right eye: Secondary | ICD-10-CM | POA: Diagnosis not present

## 2018-01-18 DIAGNOSIS — H5202 Hypermetropia, left eye: Secondary | ICD-10-CM | POA: Diagnosis not present

## 2018-01-18 DIAGNOSIS — H401231 Low-tension glaucoma, bilateral, mild stage: Secondary | ICD-10-CM | POA: Diagnosis not present

## 2018-01-18 DIAGNOSIS — H11823 Conjunctivochalasis, bilateral: Secondary | ICD-10-CM | POA: Diagnosis not present

## 2018-01-18 DIAGNOSIS — H0102A Squamous blepharitis right eye, upper and lower eyelids: Secondary | ICD-10-CM | POA: Diagnosis not present

## 2018-01-18 DIAGNOSIS — H52223 Regular astigmatism, bilateral: Secondary | ICD-10-CM | POA: Diagnosis not present

## 2018-01-18 DIAGNOSIS — H04123 Dry eye syndrome of bilateral lacrimal glands: Secondary | ICD-10-CM | POA: Diagnosis not present

## 2018-01-18 DIAGNOSIS — H18413 Arcus senilis, bilateral: Secondary | ICD-10-CM | POA: Diagnosis not present

## 2018-01-18 DIAGNOSIS — H0102B Squamous blepharitis left eye, upper and lower eyelids: Secondary | ICD-10-CM | POA: Diagnosis not present

## 2018-01-18 DIAGNOSIS — Z961 Presence of intraocular lens: Secondary | ICD-10-CM | POA: Diagnosis not present

## 2018-01-20 ENCOUNTER — Ambulatory Visit (INDEPENDENT_AMBULATORY_CARE_PROVIDER_SITE_OTHER): Payer: PPO | Admitting: Medical

## 2018-01-20 ENCOUNTER — Encounter: Payer: Self-pay | Admitting: Medical

## 2018-01-20 VITALS — Temp 97.7°F | Resp 16 | Ht 71.0 in | Wt 242.6 lb

## 2018-01-20 DIAGNOSIS — Z23 Encounter for immunization: Secondary | ICD-10-CM | POA: Insufficient documentation

## 2018-01-20 DIAGNOSIS — R7303 Prediabetes: Secondary | ICD-10-CM

## 2018-01-20 DIAGNOSIS — R42 Dizziness and giddiness: Secondary | ICD-10-CM

## 2018-01-20 DIAGNOSIS — R51 Headache: Secondary | ICD-10-CM

## 2018-01-20 DIAGNOSIS — R0689 Other abnormalities of breathing: Secondary | ICD-10-CM | POA: Diagnosis not present

## 2018-01-20 DIAGNOSIS — R5381 Other malaise: Secondary | ICD-10-CM | POA: Diagnosis not present

## 2018-01-20 DIAGNOSIS — R519 Headache, unspecified: Secondary | ICD-10-CM

## 2018-01-20 DIAGNOSIS — G8929 Other chronic pain: Secondary | ICD-10-CM

## 2018-01-20 LAB — POCT GLYCOSYLATED HEMOGLOBIN (HGB A1C): Hemoglobin A1C: 6.5 % — AB (ref 4.0–5.6)

## 2018-01-20 MED ORDER — LOSARTAN POTASSIUM-HCTZ 100-25 MG PO TABS: 1.0000 | ORAL_TABLET | Freq: Every day | ORAL | 5 refills | Status: DC

## 2018-01-20 NOTE — Patient Instructions (Addendum)
Recommendations  Try to drink at least 64 ounces of water throughout the day  Drink enough water so that your urine is clear  When you are feeling dizzy check your blood sugars to make sure you are not running lower than 70  Also if you feel dizzy if you check your blood pressure and it is less than 110/70, you may be having low blood pressure readings  Begin back on the losartan HCT blood pressure pill  Check your blood pressures 2 or 3 days/week for the next few weeks to make sure he is running within a reasonable range such as 120-130 top number, 70-80 bottom number  If your blood pressures are running less than 105/65, then this is too low for you.  We would need to back off the medicine in that case  Over the next week or 2 if your symptoms do not improve, or if you experience shortness of breath, chest pain, leg swelling, numbness or tingling or any other new or worse symptoms then call or return

## 2018-01-20 NOTE — Progress Notes (Signed)
Subjective: Chief Complaint  Patient presents with  . dizzy    woozy, dizzy tired X today   Here for not feeling well.  Normally sees Joel Dingwall, NP here.  He is [name] with numerous health issues including coronary artery disease, diabetes, prostate cancer, chart noted history of COPD, high blood pressure here today for not feeling well  Has been tired some in general, but this morning felt woozy and tired.  Has had some dizziness, dizziness for minutes.  Denies vertigo sensation, not lightheaded.   No NVD.  Drinks some water, but not a lot.  No recent alcohol.  No paresthesias, no extremity weakness.   No CP, no SOB, no edema, no DOE.   No fever.   Has headache problems, was seeing Dr. Domingo [name] for years, but insurance changed and he couldn't see headache clinic any more.  Is awaiting consult with neurology.  Been having headaches recently.  Quit smoking years ago.  He was taking losartan HCT but there was apparently recall at the pharmacy and he has been out of this.  he wonders what that is causing the symptoms.   No other aggravating or relieving factors. No other complaint.   Past Medical History:  Diagnosis Date  . Atypical chest pain   . CAD (coronary artery disease)    a. Multiple prior evaluations then 05/2012 s/p BMS to OM2. b. Nuc 11/2012 wnl. c. Cath 2015: patent stent, moderate LAD/RCA dz, treated medically.  . Cancer ([place])    Hx: of prostate  . Cataract   . CKD (chronic kidney disease), stage II   . COPD (chronic obstructive pulmonary disease) ([place])   . Cough secondary to angiotensin converting enzyme inhibitor (ACE-I)   . Diabetes mellitus without complication ([place])   . Diverticulosis   . DVT (deep venous thrombosis) ([place])   . Family history of breast cancer in first degree relative   . GERD (gastroesophageal reflux disease)   . Headache(784.0)    Hx: of  . History of blood transfusion    "I've had 2; don't know what it was related to"  (11/09/2012)  . HLD (hyperlipidemia)   . HTN (hypertension)   . Iron deficiency anemia   . Microcytic anemia   . Peripheral vascular disease ([place])    a. L pop-tibial bypass 2011, followed by [facility].  . Personal history of prostate cancer   . Rotator cuff injury    "left arm; never repaired" (11/09/2012)   Current Outpatient Medications on File Prior to Visit  Medication Sig Dispense Refill  . abiraterone Acetate (ZYTIGA) 250 MG tablet Take 1,000 mg by mouth daily before breakfast. Take on an empty stomach 1 hour before or 2 hours after a meal     . albuterol (PROVENTIL HFA;VENTOLIN HFA) 108 (90 Base) MCG/ACT inhaler Inhale 2 puffs every 4 (four) hours as needed into the lungs for wheezing or shortness of breath.    Marland Kitchen aspirin EC 81 MG tablet Take 81 mg by mouth daily.     . brimonidine (ALPHAGAN) 0.2 % ophthalmic solution Place 1 drop into the right eye 2 (two) times daily.   0  . diclofenac sodium (VOLTAREN) 1 % GEL Apply 2 g topically daily as needed (pain).    . ferrous sulfate 325 (65 FE) MG tablet Take 325 mg by mouth every Monday, Wednesday, and Friday.     . gabapentin (NEURONTIN) 100 MG capsule Take 100 mg by mouth daily as needed (headache).    Marland Kitchen  gabapentin (NEURONTIN) 600 MG tablet Take 1 tablet (600 mg total) by mouth 2 (two) times daily. 60 tablet 0  . isosorbide mononitrate (IMDUR) 30 MG 24 hr tablet Take 30 mg by mouth at bedtime.     . metFORMIN (GLUCOPHAGE) 500 MG tablet Take 500 mg by mouth 2 (two) times daily with a meal. Reported on 08/27/2015    . metoCLOPramide (REGLAN) 10 MG tablet Take 1 tablet (10 mg total) by mouth every 8 (eight) hours as needed for nausea (and headache symptoms). 30 tablet 0  . Multiple Vitamin (MULTIVITAMIN WITH MINERALS) TABS tablet Take 1 tablet by mouth See admin instructions. Take one tablet by mouth every Tuesday, Thursday, Saturday morning    . nitroGLYCERIN (NITROSTAT) 0.4 MG SL tablet Place 1 tablet (0.4 mg total) under the tongue every 5 (five)  minutes as needed for chest pain. 25 tablet 2  . omeprazole (PRILOSEC) 20 MG capsule Take 20 mg by mouth 2 (two) times daily before a meal.    . pravastatin (PRAVACHOL) 40 MG tablet Take 1 tablet (40 mg total) by mouth every evening. (Patient taking differently: Take 40 mg by mouth at bedtime. ) 30 tablet 11  . tamsulosin (FLOMAX) 0.4 MG CAPS capsule Take 0.4 mg at bedtime by mouth.    . traMADol (ULTRAM) 50 MG tablet take 1 tablet by mouth every 8 hours if needed FOR PAIN (Patient taking differently: Take 50 mg by mouth every 8 (eight) hours as needed (pain). ) 30 tablet 0  . amLODipine (NORVASC) 10 MG tablet TAKE 1 TABLET BY MOUTH ONCE DAILY (Patient not taking: No sig reported) 90 tablet 1  . predniSONE (DELTASONE) 5 MG tablet Take 5 mg by mouth daily before breakfast.     . prochlorperazine (COMPAZINE) 10 MG tablet Take 1 tablet (10 mg total) by mouth 2 (two) times daily as needed (headache). (Patient not taking: Reported on 01/20/2018) 10 tablet 0   Current Facility-Administered Medications on File Prior to Visit  Medication Dose Route Frequency Provider Last Rate Last Dose  . 0.9 %  sodium chloride infusion  500 mL Intravenous Continuous Gatha Mayer, MD       ROS as in subjective    Objective: Temp 97.7 F (36.5 C) (Oral)   Resp 16   Ht 5\' 11"  (1.803 m)   Wt 242 lb 9.6 oz (110 kg)   SpO2 97%   BMI 33.84 kg/m   Wt Readings from Last 3 Encounters:  01/20/18 242 lb 9.6 oz (110 kg)  12/23/17 241 lb (109.3 kg)  12/11/17 241 lb (109.3 kg)   BP Readings from Last 3 Encounters:  12/24/17 (!) 148/78  12/11/17 (!) 149/83  12/07/17 130/68    General appearance: alert, no distress, WD/WN, AA male HEENT: normocephalic, sclerae anicteric, PERRLA, EOMi, nares patent, no discharge or erythema, pharynx normal Oral cavity: MMM, no lesions Neck: supple, no lymphadenopathy, no thyromegaly, no masses, no bruits Heart: RRR, normal S1, S2, no murmurs Lungs: CTA bilaterally, no wheezes,  rhonchi, or rales Extremities: no edema, no cyanosis, no clubbing Pulses: 2+ symmetric, upper and lower extremities, normal cap refill Neurological: She was slightly unsteady with heel-to-toe but otherwise neuro exam normal, alert, oriented x 3, CN2-12 intact, strength normal upper extremities and lower extremities, sensation normal throughout, DTRs 2+ throughout, no cerebellar signs, gait normal Psychiatric: normal affect, behavior normal, pleasant      Assessment: Encounter Diagnoses  Name Primary?  . Dizziness Yes  . Chronic nonintractable headache, unspecified  headache type   . Malaise   . Decreased breath sounds   . Needs flu shot   . Prediabetes      Plan: Because of his symptoms unclear.  He is only had symptoms last several hours.  Started today.  He has been out of his losartan HCT, and recently had to stop treatment through [facility] as his insurance was not accepted there anymore.  These could be factors in his symptoms.  I reviewed recent labs in the chart from May, July and September.  He has been stable with his anemia, liver and kidney tests have been normal.  PFT abnormal, moderate airway obstruction.  He denies any dyspnea, shortness of breath, dyspnea on exertion.  Quit smoking years ago.  For now we will hold off on preventative inhaler, but we will consider this going forward if symptoms are not improving.  Discussed the recommendations below.  If worse in the meantime call return or call 911 if symptoms of stroke or heart attack as discussed  Patient Instructions  Recommendations  Try to drink at least 64 ounces of water throughout the day  Drink enough water so that your urine is clear  When you are feeling dizzy check your blood sugars to make sure you are not running lower than 70  Also if you feel dizzy if you check your blood pressure and it is less than 110/70, you may be having low blood pressure readings  Begin back on the  losartan HCT blood pressure pill  Check your blood pressures 2 or 3 days/week for the next few weeks to make sure he is running within a reasonable range such as 120-130 top number, 70-80 bottom number  If your blood pressures are running less than 105/65, then this is too low for you.  We would need to back off the medicine in that case  Over the next week or 2 if your symptoms do not improve, or if you experience shortness of breath, chest pain, leg swelling, numbness or tingling or any other new or worse symptoms then call or return   Counseled on the influenza virus vaccine.  Vaccine information sheet given.   High dose Influenza vaccine given after consent obtained.   Benny was seen today for dizzy.  Diagnoses and all orders for this visit:  Dizziness -     HgB A1c  Chronic nonintractable headache, unspecified headache type  Malaise -     HgB A1c  Decreased breath sounds -     Spirometry with Graph  Needs flu shot -     Flu vaccine HIGH DOSE PF (Fluzone High Dose)  Prediabetes  Other orders -     losartan-hydrochlorothiazide (HYZAAR) 100-25 MG tablet; Take 1 tablet by mouth daily.

## 2018-01-28 ENCOUNTER — Emergency Department (HOSPITAL_COMMUNITY)
Admission: EM | Admit: 2018-01-28 | Discharge: 2018-01-28 | Disposition: A | Discharge status: Home / Self Care | Payer: PPO | Attending: Emergency Medicine | Admitting: Emergency Medicine

## 2018-01-28 ENCOUNTER — Emergency Department (HOSPITAL_COMMUNITY): Payer: PPO

## 2018-01-28 ENCOUNTER — Encounter (HOSPITAL_COMMUNITY): Payer: Self-pay | Admitting: Emergency Medicine

## 2018-01-28 DIAGNOSIS — R0989 Other specified symptoms and signs involving the circulatory and respiratory systems: Secondary | ICD-10-CM | POA: Diagnosis not present

## 2018-01-28 DIAGNOSIS — R0789 Other chest pain: Secondary | ICD-10-CM | POA: Diagnosis not present

## 2018-01-28 DIAGNOSIS — Z79899 Other long term (current) drug therapy: Secondary | ICD-10-CM | POA: Insufficient documentation

## 2018-01-28 DIAGNOSIS — I129 Hypertensive chronic kidney disease with stage 1 through stage 4 chronic kidney disease, or unspecified chronic kidney disease: Secondary | ICD-10-CM | POA: Insufficient documentation

## 2018-01-28 DIAGNOSIS — N182 Chronic kidney disease, stage 2 (mild): Secondary | ICD-10-CM | POA: Diagnosis not present

## 2018-01-28 DIAGNOSIS — Z87891 Personal history of nicotine dependence: Secondary | ICD-10-CM | POA: Diagnosis not present

## 2018-01-28 DIAGNOSIS — E119 Type 2 diabetes mellitus without complications: Secondary | ICD-10-CM | POA: Diagnosis not present

## 2018-01-28 DIAGNOSIS — Z7982 Long term (current) use of aspirin: Secondary | ICD-10-CM | POA: Diagnosis not present

## 2018-01-28 DIAGNOSIS — R079 Chest pain, unspecified: Secondary | ICD-10-CM

## 2018-01-28 DIAGNOSIS — J449 Chronic obstructive pulmonary disease, unspecified: Secondary | ICD-10-CM | POA: Insufficient documentation

## 2018-01-28 DIAGNOSIS — Z7984 Long term (current) use of oral hypoglycemic drugs: Secondary | ICD-10-CM | POA: Insufficient documentation

## 2018-01-28 LAB — BASIC METABOLIC PANEL
Anion gap: 8 (ref 5–15)
BUN: 11 mg/dL (ref 8–23)
CO2: 24 mmol/L (ref 22–32)
Calcium: 9.1 mg/dL (ref 8.9–10.3)
Chloride: 104 mmol/L (ref 98–111)
Creatinine, Ser: 1.28 mg/dL — ABNORMAL HIGH (ref 0.61–1.24)
GFR calc Af Amer: >60 mL/min (ref >60)
GFR calc non Af Amer: 52 mL/min — ABNORMAL LOW (ref >60)
Glucose, Bld: 126 mg/dL — ABNORMAL HIGH (ref 70–99)
Potassium: 4 mmol/L (ref 3.5–5.1)
Sodium: 136 mmol/L (ref 135–145)

## 2018-01-28 LAB — CBC
HCT: 31.2 % — ABNORMAL LOW (ref 39.0–52.0)
Hemoglobin: 9.3 g/dL — ABNORMAL LOW (ref 13.0–17.0)
MCH: 21.7 pg — ABNORMAL LOW (ref 26.0–34.0)
MCHC: 29.8 g/dL — ABNORMAL LOW (ref 30.0–36.0)
MCV: 72.7 fL — ABNORMAL LOW (ref 80.0–100.0)
Platelets: 193 10*3/uL (ref 150–400)
RBC: 4.29 MIL/uL (ref 4.22–5.81)
RDW: 16.2 % — ABNORMAL HIGH (ref 11.5–15.5)
WBC: 5.7 10*3/uL (ref 4.0–10.5)
nRBC: 0 % (ref 0.0–0.2)

## 2018-01-28 LAB — I-STAT TROPONIN, ED
Troponin i, poc: 0 ng/mL (ref 0.00–0.08)
Troponin i, poc: 0 ng/mL (ref 0.00–0.08)

## 2018-01-28 MED ORDER — ALBUTEROL SULFATE HFA 108 (90 BASE) MCG/ACT IN AERS: 2.0000 | INHALATION_SPRAY | RESPIRATORY_TRACT | 2 refills | Status: DC | PRN

## 2018-01-28 NOTE — ED Triage Notes (Signed)
Pt to ER for evaluation of 2 days left chest aching and tightness with shortness of breath. Reports pain radiates into left arm. Pt reports intermittent dizziness as well.

## 2018-01-28 NOTE — Discharge Instructions (Addendum)
[name],  It was a pleasure taking care of you here in the Ed. All the lab work we did came back normal. As we already discussed with the cardiologist, we will like for you to follow up with them. Please call on Monday and schedule an appointment.   However, if you begin experiencing worsening chest pain, trouble breathing, nausea, vomiting, headaches, dizziness or feel like you are going to faint please report back to the emergency department.  ~Take Care Dr. Eileen Stanford

## 2018-01-28 NOTE — ED Notes (Signed)
Results reviewed, no changes in acuity at this time 

## 2018-01-28 NOTE — Consult Note (Addendum)
Cardiology Consult    Patient ID: [name] MRN: [id number], DOB/AGE: 1940/04/19   Admit date: 01/28/2018 Date of Consult: 01/28/2018  Primary Physician: Joel Rm, NP-C Primary Cardiologist: Joel Chandler, MD Requesting Provider: Nathanial Rancher, MD  Patient Profile    Joel [name] is a 77 y.o. male with a history of HTN, HLD, CAD, PAD, DM, who is being seen today for the evaluation of chest pain at the request of Dr. Eileen [name].  Past Medical History   Past Medical History:  Diagnosis Date  . Atypical chest pain   . CAD (coronary artery disease)    a. Multiple prior evaluations then 05/2012 s/p BMS to OM2. b. Nuc 11/2012 wnl. c. Cath 2015: patent stent, moderate LAD/RCA dz, treated medically.  . Cancer ([place])    Hx: of prostate  . Cataract   . CKD (chronic kidney disease), stage II   . COPD (chronic obstructive pulmonary disease) ([place])   . Cough secondary to angiotensin converting enzyme inhibitor (ACE-I)   . Diabetes mellitus without complication ([place])   . Diverticulosis   . DVT (deep venous thrombosis) ([place])   . Family history of breast cancer in first degree relative   . GERD (gastroesophageal reflux disease)   . Headache(784.0)    Hx: of  . History of blood transfusion    "I've had 2; don't know what it was related to" (11/09/2012)  . HLD (hyperlipidemia)   . HTN (hypertension)   . Iron deficiency anemia   . Microcytic anemia   . Peripheral vascular disease ([place])    a. L pop-tibial bypass 2011, followed by [facility].  . Personal history of prostate cancer   . Rotator cuff injury    "left arm; never repaired" (11/09/2012)    Past Surgical History:  Procedure Laterality Date  . CARDIOVASCULAR STRESS TEST  Aug. 2014  . CATARACT EXTRACTION W/ INTRAOCULAR LENS IMPLANT Right ~ 2008  . CIRCUMCISION    . COLONOSCOPY    . CORONARY ANGIOPLASTY WITH STENT PLACEMENT  2014   "1" (11/09/2012)  . ILIAC ARTERY ANEURYSM REPAIR    . LEFT HEART CATH  05-31-12  .  LEFT HEART CATHETERIZATION WITH CORONARY ANGIOGRAM N/A 06/01/2011   Procedure: LEFT HEART CATHETERIZATION WITH CORONARY ANGIOGRAM;  Surgeon: Hillary Bow, MD;  Location: [facility] CATH LAB;  Service: Cardiovascular;  Laterality: N/A;  . LEFT HEART CATHETERIZATION WITH CORONARY ANGIOGRAM N/A 05/31/2012   Procedure: LEFT HEART CATHETERIZATION WITH CORONARY ANGIOGRAM;  Surgeon: Peter M Martinique, MD;  Location[facility] CATH LAB;  Service: Cardiovascular;  Laterality: N/A;  . LEFT HEART CATHETERIZATION WITH CORONARY ANGIOGRAM N/A 08/01/2013   Procedure: LEFT HEART CATHETERIZATION WITH CORONARY ANGIOGRAM;  Surgeon: Burnell Blanks, MD;  Location[facility] CATH LAB;  Service: Cardiovascular;  Laterality: N/A;  . PERCUTANEOUS CORONARY STENT INTERVENTION (PCI-S)  05/31/2012   Procedure: PERCUTANEOUS CORONARY STENT INTERVENTION (PCI-S);  Surgeon: Peter M Martinique, MD;  Location: [facility] CATH LAB;  Service: Cardiovascular;;  . PR VEIN BYPASS GRAFT,AORTO-FEM-POP Left 10/01/2009   left below knee popliteal artery to posterior tibial artery  . SHOULDER ARTHROSCOPY WITH OPEN ROTATOR CUFF REPAIR AND DISTAL CLAVICLE ACROMINECTOMY Left 01/12/2013   Procedure: LEFT SHOULDER ARTHROSCOPY WITH DEBRIDEMENT OPEN DISTAL CLAVICLE RESECTION ,acromioplastyAND ROTATOR CUFF REPAIR;  Surgeon: Yvette Rack., MD;  Location: [place];  Service: Orthopedics;  Laterality: Left;     Allergies  Allergies  Allergen Reactions  . Topiramate Shortness Of Breath and Other (See Comments)     leg  cramps  . Atenolol Other (See Comments)    bradycardia  . Lisinopril Cough  . Tramadol-Acetaminophen Cough    Tolerates plain Tramadol    History of Present Illness    Joel [name] is a 1 yom with CAD, HTN, HL, DMII who presents to the ED due to chest pain.  The patient has been followed by Joel [name] for hist history of CAD. He underwent BMS placement to OM2 in 2014. Nuclear stress test in 2014 in the setting of recurrent chest pain did not show any  ischemia. Repeat cath in 2015 showed patent stent, mild to moderate disease in the LAD and RCA which was medically managed. He was seen several times in 2015-2017 due to recurrent chest pain, without elevated in troponin. Nuclear stress test November 2018 with EF 55% with moderate inferior defect that partially improves consistent with ischemia and possible soft tissue attenuation. Small region of anterior anterior septal ischemia. He again presented to the ED in 07/2017, was seen by Joel [name] and given atypical chest pain, without elevation in troponin or ECG changes, he was discharged home. Followed up in clinic several weeks later and was doing well.    The patient reports he continued to do well but yesterday after waking up noted tingling in his chest. This continued all day, was not worsened by activity. Resolved in the evening upon going to sleep. Did take a SLNG which the patient believes may have helped. Today, the patient reports several episodes of shortness of breath, which he reports were improved by use of his inhaler. No chest pain at all today. Denies orthopnea, PND, LEE, dizziness, syncope.   Home Medications      Current Facility-Administered Medications on File Prior to Encounter  Medication Dose Route Frequency Provider Last Rate Last Dose  . 0.9 %  sodium chloride infusion  500 mL Intravenous Continuous Gatha Mayer, MD       Current Outpatient Medications on File Prior to Encounter  Medication Sig Dispense Refill  . abiraterone Acetate (ZYTIGA) 250 MG tablet Take 1,000 mg by mouth daily before breakfast. Take on an empty stomach 1 hour before or 2 hours after a meal     . amLODipine (NORVASC) 10 MG tablet TAKE 1 TABLET BY MOUTH ONCE DAILY (Patient not taking: No sig reported) 90 tablet 1  . aspirin EC 81 MG tablet Take 81 mg by mouth daily.     . brimonidine (ALPHAGAN) 0.2 % ophthalmic solution Place 1 drop into the right eye 2 (two) times daily.   0  . diclofenac sodium  (VOLTAREN) 1 % GEL Apply 2 g topically daily as needed (pain).    . ferrous sulfate 325 (65 FE) MG tablet Take 325 mg by mouth every Monday, Wednesday, and Friday.     . gabapentin (NEURONTIN) 100 MG capsule Take 100 mg by mouth daily as needed (headache).    . gabapentin (NEURONTIN) 600 MG tablet Take 1 tablet (600 mg total) by mouth 2 (two) times daily. 60 tablet 0  . isosorbide mononitrate (IMDUR) 30 MG 24 hr tablet Take 30 mg by mouth at bedtime.     Marland Kitchen losartan-hydrochlorothiazide (HYZAAR) 100-25 MG tablet Take 1 tablet by mouth daily. 30 tablet 5  . metFORMIN (GLUCOPHAGE) 500 MG tablet Take 500 mg by mouth 2 (two) times daily with a meal. Reported on 08/27/2015    . metoCLOPramide (REGLAN) 10 MG tablet Take 1 tablet (10 mg total) by mouth every 8 (eight) hours as  needed for nausea (and headache symptoms). 30 tablet 0  . Multiple Vitamin (MULTIVITAMIN WITH MINERALS) TABS tablet Take 1 tablet by mouth See admin instructions. Take one tablet by mouth every Tuesday, Thursday, Saturday morning    . nitroGLYCERIN (NITROSTAT) 0.4 MG SL tablet Place 1 tablet (0.4 mg total) under the tongue every 5 (five) minutes as needed for chest pain. 25 tablet 2  . omeprazole (PRILOSEC) 20 MG capsule Take 20 mg by mouth 2 (two) times daily before a meal.    . pravastatin (PRAVACHOL) 40 MG tablet Take 1 tablet (40 mg total) by mouth every evening. (Patient taking differently: Take 40 mg by mouth at bedtime. ) 30 tablet 11  . predniSONE (DELTASONE) 5 MG tablet Take 5 mg by mouth daily before breakfast.     . prochlorperazine (COMPAZINE) 10 MG tablet Take 1 tablet (10 mg total) by mouth 2 (two) times daily as needed (headache). (Patient not taking: Reported on 01/20/2018) 10 tablet 0  . tamsulosin (FLOMAX) 0.4 MG CAPS capsule Take 0.4 mg at bedtime by mouth.    . traMADol (ULTRAM) 50 MG tablet take 1 tablet by mouth every 8 hours if needed FOR PAIN (Patient taking differently: Take 50 mg by mouth every 8 (eight) hours as  needed (pain). ) 30 tablet 0     Family History    Family History  Problem Relation Age of Onset  . Breast cancer Mother        dx 37s  . Hyperlipidemia Mother   . Hypertension Mother   . COPD Father        deceased  . Hyperlipidemia Father   . Hypertension Father   . Hypertension Sister   . Cancer Brother 59       Unknown cancer, possibly stomach  . Hypertension Brother   . Cancer Brother 79       Unknown, possibly lung  . Hypertension Daughter   . Hypertension Son   . Colon cancer Neg Hx        pt is unclear about family hx   He indicated that his mother is deceased. He indicated that his father is deceased. He indicated that all of his three sisters are alive. He indicated that all of his four brothers are deceased. He indicated that the status of his daughter is unknown. He indicated that the status of his son is unknown. He indicated that the status of his neg hx is unknown.   Social History    Social History   Socioeconomic History  . Marital status: Widowed    Spouse name: Not on file  . Number of children: Not on file  . Years of education: Not on file  . Highest education level: Not on file  Occupational History  . Occupation: Retired  Scientific laboratory technician  . Financial resource strain: Not on file  . Food insecurity:    Worry: Not on file    Inability: Not on file  . Transportation needs:    Medical: Not on file    Non-medical: Not on file  Tobacco Use  . Smoking status: Former Smoker    Packs/day: 0.00    Types: Cigarettes    Last attempt to quit: 05/26/2011    Years since quitting: 6.6  . Smokeless tobacco: Never Used  Substance and Sexual Activity  . Alcohol use: No  . Drug use: No  . Sexual activity: Not on file  Lifestyle  . Physical activity:    Days per week:  Not on file    Minutes per session: Not on file  . Stress: Not on file  Relationships  . Social connections:    Talks on phone: Not on file    Gets together: Not on file    Attends  religious service: Not on file    Active member of club or organization: Not on file    Attends meetings of clubs or organizations: Not on file    Relationship status: Not on file  . Intimate partner violence:    Fear of current or ex partner: Not on file    Emotionally abused: Not on file    Physically abused: Not on file    Forced sexual activity: Not on file  Other Topics Concern  . 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  Lives in [place] by himself.     Review of Systems    General:  No chills, fever, night sweats or weight changes.  Cardiovascular:  No edema, orthopnea, palpitations, paroxysmal nocturnal dyspnea. Dermatological: No rash, lesions/masses Respiratory: No cough, dyspnea Urologic: No hematuria, dysuria Abdominal:   No nausea, vomiting, diarrhea, bright red blood per rectum, melena, or hematemesis Neurologic:  No visual changes, wkns, changes in mental status. All other systems reviewed and are otherwise negative except as noted above.  Physical Exam    Blood pressure (!) 142/67, pulse (!) 55, temperature 97.6 F (36.4 C), temperature source Oral, resp. rate 18, SpO2 97 %.  General: Pleasant, NAD Psych: Normal affect. Neuro: Alert and oriented X 3. Moves all extremities spontaneously. HEENT: Normal  Neck: Supple without bruits or JVD. Lungs:  Poor movement of air throughout but without wheezing, rhonchi or rales.  Heart: RRR no s3, s4, or murmurs. Abdomen: Soft, non-tender, non-distended, BS + x 4.  Extremities: No clubbing, cyanosis or edema. DP/PT/Radials 2+ and equal bilaterally.  Labs    Troponin [facility] of Care Test) Recent Labs    01/28/18 2010  TROPIPOC 0.00   No results for input(s): CKTOTAL, CKMB, TROPONINI in the last 72 hours. Lab Results  Component Value Date   WBC 5.7 01/28/2018   HGB 9.3 (L) 01/28/2018   HCT 31.2 (L) 01/28/2018   MCV 72.7 (L) 01/28/2018   PLT 193 01/28/2018    Recent  Labs  Lab 01/28/18 1512  NA 136  K 4.0  CL 104  CO2 24  BUN 11  CREATININE 1.28*  CALCIUM 9.1  GLUCOSE 126*   Lab Results  Component Value Date   CHOL 174 10/24/2017   HDL 47 10/24/2017   LDLCALC 100 (H) 10/24/2017   TRIG 135 10/24/2017   Lab Results  Component Value Date   DDIMER 1.74 (H) 09/15/2011     Radiology Studies    Dg Chest 2 View  Result Date: 01/28/2018 CLINICAL DATA:  Left chest and arm pain since yesterday. EXAM: CHEST - 2 VIEW COMPARISON:  PA and lateral chest 07/14/2017 05/11/2016. FINDINGS: The lungs are clear. Heart size is normal. Aortic atherosclerosis noted. No pneumothorax or pleural effusion. No acute or focal bony abnormality. IMPRESSION: No acute disease. Atherosclerosis. Electronically Signed   By: Inge Rise M.D.   On: 01/28/2018 16:18    ECG & Cardiac Imaging    ECG Sinus rhythm with PVC. No acute ischemic changes. No change from prior ECG - personally reviewed.  Cath 07/2013 Angiographic Findings:  Left main: No obstructive disease.   Left Anterior Descending Artery: Large caliber vessel  that courses to the apex. The proximal and mid vessel is ectatic. There is diffuse 30-40% stenosis in the mid vessel. There are mild luminal irregularities in the distal vessel.   Circumflex Artery: Large caliber vessel with two obtuse marginal branches. The first OM branch is small in caliber and is chronically occluded in the mid vessel. The second OM branch is patent with patent stent, minimal restenosis. The distal segment of the OM branch has diffuse plaque.   Right Coronary Artery: Large caliber dominant vessel with proximal Shepherd's crook. The proximal vessel has diffuse 40% stenosis. The mid vessel is ectatic with 40% stenosis. The distal vessel has diffuse 50% stenosis. The PDA has 50% proximal stenosis followed by diffuse plaque.   Left Ventricular Angiogram: LVEF=65%.   Impression: 1. Triple vessel CAD with patent stent in the  second OM branch.  2. Moderate non-obstructive disease in the LAD and RCA 3. Normal LV function  Assessment & Plan    Joel [name] is a 15 yom with CAD, HTN, HL, DMII who presents to the ED due to chest pain. His symptoms yesterday were generally atypical but with possible improvement with SLNG. He has had no further chest pain today and ECG is unchanged from his prior. Troponin is undetectable x2. Shortness of breath more consistent with known COPD, with improvement with use of inhaler. Discussed options with the patient including overnight observation with nuclear stress test in the AM versus discharge to home with close clinic follow-up.  Patient did not wish to stay overnight for further observation. This is reasonable given generally atypical symptoms as well as reassuring ECG and undetectable troponin x 2. No chest pain for >12 hours at this point. His blood pressure is stable.  The patient noted understanding and will return to the ED if any symptoms recur. We will arrange clinic follow-up for the patient with Joel [name].  Signed, Bryna Colander, MD 01/28/2018, 8:57 PM  For questions or updates, please contact   Please consult [email or web address] for contact info under Cardiology/STEMI.

## 2018-01-28 NOTE — ED Provider Notes (Signed)
[facility] EMERGENCY DEPARTMENT Provider Note   CSN: [id number] Arrival date & time: 01/28/18  1445     History   Chief Complaint Chief Complaint  Patient presents with  . Chest Pain  . Shortness of Breath    HPI Joel [name] is a 76 y.o. male with medical history significant for coronary artery disease status post PCI 2010, diabetes mellitus, hypertension, chronic stable angina, peripheral arterial disease, prostate cancer, bradycardia, microcytic anemia, COPD presenting for evaluation of chest pain.  He was in his usual state of health until yesterday when he experienced sudden onset left-sided chest and left arm pain.  He rates the pain at 5/10.  He denies any elevating or worsening symptoms.  He states that the pain lasted all day but had improvement after taking 1 pill of nitroglycerin.  He denies palpitation, nausea, vomiting, diaphoresis, headaches or lightheadedness.  This morning, he began experiencing intermittent shortness of breath that was relieved with his inhaler.  He denies orthopnea, paroxysmal nocturnal dyspnea, melena, cough pain or any recent immobilization.    Past Medical History:  Diagnosis Date  . Atypical chest pain   . CAD (coronary artery disease)    a. Multiple prior evaluations then 05/2012 s/p BMS to OM2. b. Nuc 11/2012 wnl. c. Cath 2015: patent stent, moderate LAD/RCA dz, treated medically.  . Cancer ([place])    Hx: of prostate  . Cataract   . CKD (chronic kidney disease), stage II   . COPD (chronic obstructive pulmonary disease) ([place])   . Cough secondary to angiotensin converting enzyme inhibitor (ACE-I)   . Diabetes mellitus without complication ([place])   . Diverticulosis   . DVT (deep venous thrombosis) ([place])   . Family history of breast cancer in first degree relative   . GERD (gastroesophageal reflux disease)   . Headache(784.0)    Hx: of  . History of blood transfusion    "I've had 2; don't know what it was related to"  (11/09/2012)  . HLD (hyperlipidemia)   . HTN (hypertension)   . Iron deficiency anemia   . Microcytic anemia   . Peripheral vascular disease ([place])    a. L pop-tibial bypass 2011, followed by [facility].  . Personal history of prostate cancer   . Rotator cuff injury    "left arm; never repaired" (11/09/2012)    Patient Active Problem List   Diagnosis Date Noted  . Dizziness 01/20/2018  . Malaise 01/20/2018  . Decreased breath sounds 01/20/2018  . Needs flu shot 01/20/2018  . Chronic nonintractable headache 12/07/2017  . Back pain 11/01/2017  . Radicular pain of lower extremity 11/01/2017  . Prostate cancer ([place]) 11/01/2017  . Family history of breast cancer in first degree relative   . Personal history of prostate cancer   . Elliptocytosis on peripheral blood smear ([place]) 09/13/2016  . Prediabetes 03/22/2016  . PVD (peripheral vascular disease) with claudication ([place]) 11/15/2012  . Chest pain, localized 11/10/2012  . Hypokalemia 11/10/2012  . Bradycardia 11/09/2012  . Thrombocytopenia ([name]) 06/01/2012  . Microcytic anemia   . Atherosclerosis of native arteries of the extremities with intermittent claudication 06/16/2011  . Stable angina ([place]) 06/11/2011  . CAD (coronary artery disease)   . Chest pain, atypical 05/30/2011  . Iron deficiency anemia 10/03/2009  . DIVERTICULOSIS OF COLON 10/03/2009  . WEIGHT LOSS, ABNORMAL 10/03/2009  . CHEST PAIN UNSPECIFIED 09/13/2008  . Headache(784.0) 08/19/2008  . TIA 06/05/2008  . SHOULDER PAIN, LEFT 10/11/2007  . COUGH DUE TO  ACE INHIBITORS 05/07/2007  . History of tobacco use disorder 04/25/2007  . Hyperlipidemia 04/21/2007  . Essential hypertension 04/21/2007  . GERD 04/21/2007    Past Surgical History:  Procedure Laterality Date  . CARDIOVASCULAR STRESS TEST  Aug. 2014  . CATARACT EXTRACTION W/ INTRAOCULAR LENS IMPLANT Right ~ 2008  . CIRCUMCISION    . COLONOSCOPY    . CORONARY ANGIOPLASTY WITH STENT PLACEMENT  2014   "1"  (11/09/2012)  . ILIAC ARTERY ANEURYSM REPAIR    . LEFT HEART CATH  05-31-12  . LEFT HEART CATHETERIZATION WITH CORONARY ANGIOGRAM N/A 06/01/2011   Procedure: LEFT HEART CATHETERIZATION WITH CORONARY ANGIOGRAM;  Surgeon: Hillary Bow, MD;  Location: [facility] CATH LAB;  Service: Cardiovascular;  Laterality: N/A;  . LEFT HEART CATHETERIZATION WITH CORONARY ANGIOGRAM N/A 05/31/2012   Procedure: LEFT HEART CATHETERIZATION WITH CORONARY ANGIOGRAM;  Surgeon: Peter M Martinique, MD;  Location: [facility] CATH LAB;  Service: Cardiovascular;  Laterality: N/A;  . LEFT HEART CATHETERIZATION WITH CORONARY ANGIOGRAM N/A 08/01/2013   Procedure: LEFT HEART CATHETERIZATION WITH CORONARY ANGIOGRAM;  Surgeon: Burnell Blanks, MD;  Location: [facility] CATH LAB;  Service: Cardiovascular;  Laterality: N/A;  . PERCUTANEOUS CORONARY STENT INTERVENTION (PCI-S)  05/31/2012   Procedure: PERCUTANEOUS CORONARY STENT INTERVENTION (PCI-S);  Surgeon: Peter M Martinique, MD;  Location: [facility] CATH LAB;  Service: Cardiovascular;;  . PR VEIN BYPASS GRAFT,AORTO-FEM-POP Left 10/01/2009   left below knee popliteal artery to posterior tibial artery  . SHOULDER ARTHROSCOPY WITH OPEN ROTATOR CUFF REPAIR AND DISTAL CLAVICLE ACROMINECTOMY Left 01/12/2013   Procedure: LEFT SHOULDER ARTHROSCOPY WITH DEBRIDEMENT OPEN DISTAL CLAVICLE RESECTION ,acromioplastyAND ROTATOR CUFF REPAIR;  Surgeon: Yvette Rack., MD;  Location: [facility];  Service: Orthopedics;  Laterality: Left;        Home Medications    Prior to Admission medications   Medication Sig Start Date End Date Taking? Authorizing Provider  abiraterone Acetate (ZYTIGA) 250 MG tablet Take 1,000 mg by mouth daily before breakfast. Take on an empty stomach 1 hour before or 2 hours after a meal     [provider]  albuterol (PROVENTIL HFA;VENTOLIN HFA) 108 (90 Base) MCG/ACT inhaler Inhale 2 puffs into the lungs every 4 (four) hours as needed for wheezing or shortness of breath. 01/28/18   Anoop Hemmer, Caprice Kluver, MD    amLODipine (NORVASC) 10 MG tablet TAKE 1 TABLET BY MOUTH ONCE DAILY Patient not taking: No sig reported 11/07/17   [name] L, NP-C  aspirin EC 81 MG tablet Take 81 mg by mouth daily.     [provider]  brimonidine (ALPHAGAN) 0.2 % ophthalmic solution Place 1 drop into the right eye 2 (two) times daily.  09/13/17   [provider]  diclofenac sodium (VOLTAREN) 1 % GEL Apply 2 g topically daily as needed (pain).    [provider]  ferrous sulfate 325 (65 FE) MG tablet Take 325 mg by mouth every Monday, Wednesday, and Friday.     [provider]  gabapentin (NEURONTIN) 100 MG capsule Take 100 mg by mouth daily as needed (headache).    [provider]  gabapentin (NEURONTIN) 600 MG tablet Take 1 tablet (600 mg total) by mouth 2 (two) times daily. 11/01/17   Tysinger, Camelia Eng, PA-C  isosorbide mononitrate (IMDUR) 30 MG 24 hr tablet Take 30 mg by mouth at bedtime.     [provider]  losartan-hydrochlorothiazide (HYZAAR) 100-25 MG tablet Take 1 tablet by mouth daily. 01/20/18   Chana Bode  S, PA-C  metFORMIN (GLUCOPHAGE) 500 MG tablet Take 500 mg by mouth 2 (two) times daily with a meal. Reported on 08/27/2015    [provider]  metoCLOPramide (REGLAN) 10 MG tablet Take 1 tablet (10 mg total) by mouth every 8 (eight) hours as needed for nausea (and headache symptoms). 12/11/17   Long, Wonda Olds, MD  Multiple Vitamin (MULTIVITAMIN WITH MINERALS) TABS tablet Take 1 tablet by mouth See admin instructions. Take one tablet by mouth every Tuesday, Thursday, Saturday morning    [provider]  nitroGLYCERIN (NITROSTAT) 0.4 MG SL tablet Place 1 tablet (0.4 mg total) under the tongue every 5 (five) minutes as needed for chest pain. 12/08/15   Imogene Burn, PA-C  omeprazole (PRILOSEC) 20 MG capsule Take 20 mg by mouth 2 (two) times daily before a meal.    [provider]  pravastatin (PRAVACHOL) 40 MG tablet Take 1 tablet  (40 mg total) by mouth every evening. Patient taking differently: Take 40 mg by mouth at bedtime.  08/01/17   Burnell Blanks, MD  predniSONE (DELTASONE) 5 MG tablet Take 5 mg by mouth daily before breakfast.     [provider]  prochlorperazine (COMPAZINE) 10 MG tablet Take 1 tablet (10 mg total) by mouth 2 (two) times daily as needed (headache). Patient not taking: Reported on 01/20/2018 12/24/17   Orpah Greek, MD  tamsulosin (FLOMAX) 0.4 MG CAPS capsule Take 0.4 mg at bedtime by mouth.    [provider]  traMADol (ULTRAM) 50 MG tablet take 1 tablet by mouth every 8 hours if needed FOR PAIN Patient taking differently: Take 50 mg by mouth every 8 (eight) hours as needed (pain).  11/01/17   Tysinger, Camelia Eng, PA-C    Family History Family History  Problem Relation Age of Onset  . Breast cancer Mother        dx 65s  . Hyperlipidemia Mother   . Hypertension Mother   . COPD Father        deceased  . Hyperlipidemia Father   . Hypertension Father   . Hypertension Sister   . Cancer Brother 53       Unknown cancer, possibly stomach  . Hypertension Brother   . Cancer Brother 37       Unknown, possibly lung  . Hypertension Daughter   . Hypertension Son   . Colon cancer Neg Hx        pt is unclear about family hx    Social History Social History   Tobacco Use  . Smoking status: Former Smoker    Packs/day: 0.00    Types: Cigarettes    Last attempt to quit: 05/26/2011    Years since quitting: 6.6  . Smokeless tobacco: Never Used  Substance Use Topics  . Alcohol use: No  . Drug use: No     Allergies   Topiramate; Atenolol; Lisinopril; and Tramadol-acetaminophen   Review of Systems Review of Systems  Constitutional: Negative.   HENT: Negative.   Respiratory: Positive for chest tightness and shortness of breath.   Cardiovascular: Positive for chest pain. Negative for palpitations and leg swelling.  Gastrointestinal: Negative.     Genitourinary: Negative.   Musculoskeletal: Negative.   Skin: Negative.   Neurological: Negative.   Psychiatric/Behavioral: Negative.      Physical Exam Updated Vital Signs BP (!) 142/67   Pulse (!) 55   Temp 97.6 F (36.4 C) (Oral)   Resp 18   SpO2 97%  Physical Exam  Constitutional: He appears well-developed and well-nourished.  HENT:  Head: Normocephalic and atraumatic.  Cardiovascular: Normal rate, regular rhythm and normal pulses. Exam reveals no S3 and no S4.  Pulmonary/Chest: Effort normal and breath sounds normal. He has no decreased breath sounds. He has no wheezes. He has no rhonchi. He has no rales.  Abdominal: Soft. Bowel sounds are normal.  Neurological: He is alert.  Skin: Skin is warm.  Psychiatric: He has a normal mood and affect. His behavior is normal.     ED Treatments / Results  Labs (all labs ordered are listed, but only abnormal results are displayed) Labs Reviewed  BASIC METABOLIC PANEL - Abnormal; Notable for the following components:      Result Value   Glucose, Bld 126 (*)    Creatinine, Ser 1.28 (*)    GFR calc non Af Amer 52 (*)    All other components within normal limits  CBC - Abnormal; Notable for the following components:   Hemoglobin 9.3 (*)    HCT 31.2 (*)    MCV 72.7 (*)    MCH 21.7 (*)    MCHC 29.8 (*)    RDW 16.2 (*)    All other components within normal limits  I-STAT TROPONIN, ED  I-STAT TROPONIN, ED  I-STAT TROPONIN, ED    EKG EKG Interpretation  Date/Time:  Saturday January 28 2018 15:11:30 EDT Ventricular Rate:  65 PR Interval:  244 QRS Duration: 90 QT Interval:  420 QTC Calculation: 436 R Axis:   45 Text Interpretation:  Sinus rhythm with 1st degree A-V block with occasional Premature ventricular complexes Otherwise normal ECG No significant change since last tracing Confirmed by Blanchie Dessert [phone]) on 01/28/2018 6:21:43 PM   Radiology Dg Chest 2 View  Result Date: 01/28/2018 CLINICAL DATA:   Left chest and arm pain since yesterday. EXAM: CHEST - 2 VIEW COMPARISON:  PA and lateral chest 07/14/2017 05/11/2016. FINDINGS: The lungs are clear. Heart size is normal. Aortic atherosclerosis noted. No pneumothorax or pleural effusion. No acute or focal bony abnormality. IMPRESSION: No acute disease. Atherosclerosis. Electronically Signed   By: Inge Rise M.D.   On: 01/28/2018 16:18    Procedures Procedures (including critical care time)  Medications Ordered in ED Medications - No data to display   Initial Impression / Assessment and Plan / ED Course  I have reviewed the triage vital signs and the nursing notes.  Pertinent labs & imaging results that were available during my care of the patient were reviewed by me and considered in my medical decision making (see chart for details).   77 year old African-American gentleman with coronary artery disease status post PCI 2010, diabetes mellitus, hypertension, chronic stable angina, peripheral arterial disease, prostate cancer, bradycardia, microcytic anemia, COPD presenting for evaluation of chest pain and intermittent shortness of breath.  Differential diagnosis includes ACS, pulmonary embolism (less likely as patient does not endorse calf tenderness, swelling, pain immobile pleuritic chest pain, hypoxia), aortic dissection, pneumonia, COPD, stable angina.   On arrival vitals were unremarkable, EKG showed sinus rhythm with first-degree AV block with no new ST or T wave abnormalities, chest x-ray reviewed no acute cardiopulmonary disease, troponin were negative x2, CBC showed hemoglobin of 9.3 (previously 11.35 months ago).   She was evaluated by cardiology and was offered an overnight admission with inpatient stress test however patient refused and reported that he wanted to go home and will like to follow-up with cardiology outpatient.  Final Clinical Impressions(s) / ED  Diagnoses   Final diagnoses:  Atypical chest pain    ED  Discharge Orders         Ordered    albuterol (PROVENTIL HFA;VENTOLIN HFA) 108 (90 Base) MCG/ACT inhaler  Every 4 hours PRN,   Status:  Discontinued     01/28/18 2054    albuterol (PROVENTIL HFA;VENTOLIN HFA) 108 (90 Base) MCG/ACT inhaler  Every 4 hours PRN     01/28/18 2104           Jean Rosenthal, MD 01/28/18 2136    Blanchie Dessert, MD 01/31/18 1400

## 2018-01-30 ENCOUNTER — Telehealth: Payer: Self-pay | Admitting: Cardiovascular Disease

## 2018-01-30 NOTE — Progress Notes (Signed)
NEUROLOGY CONSULTATION NOTE  Joel [name] MRN: [id number] DOB: 1940/12/30  Referring provider: [name], NP-C Primary care provider: [name], NP-C  Reason for consult:  headache  HISTORY OF PRESENT ILLNESS: Joel [name] is a 77 year old right-handed male with CAD, CKD stage 2, COPD, type 2 diabetes, and history of DVT and prostate cancer who presents for headache.  History supplemented by referring provider's note.  Onset:  Many years.  They started in early September but started to improve 2 weeks ago. Location:  holocephalic Quality:  Non-throbbing Intensity:  Now mild to moderate.  Previously severe Aura:  none Prodrome:  none Postdrome:  none Associated symptoms:  None.  He denies associated nausea, vomiting, photophobia, phonophobia, osmophobia, visual disturbance, autonomic symptoms, unilateral numbness or weakness. 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 10/02/15:  MRA Head wo:  3 bilateral MCA aneurysms (1 at right MCA trifurcation and 2 at left MCA), stable compared to prior imaging since 2012.  Current NSAIDS:  ASA 81mg  daily Current analgesics:  Tylenol, tramadol Current triptans:  none Current ergotamine:  none Current anti-emetic:  none Current muscle relaxants:  none Current anti-anxiolytic:  none Current sleep aide:  none Current Antihypertensive medications:  Amlodipine, Hyzaar Current Antidepressant medications:  none Current Anticonvulsant medications:  none Current anti-CGRP:  none Current Vitamins/Herbal/Supplements:   Ferrous sulfate Current Antihistamines/Decongestants:  none Other therapy:  none Other medication:  none  Past NSAIDS:  ibuprofen Past analgesics:  no Past abortive triptans:  none Past abortive ergotamine:  none Past muscle relaxants:  Flexeril Past anti-emetic:  Compazine 10mg , Reglan 10mg  Past antihypertensive medications:  Atenolol 50mg  Past antidepressant medications:  none Past anticonvulsant medications:  Gabapentin 600mg  twice daily and 100mg  PRN for headache Past anti-CGRP:  none Past vitamins/Herbal/Supplements:  none Past antihistamines/decongestants:  none Other past therapies:  Botox/trigger point injections  Caffeine:  3 cups of coffee daily Diet:  3 7-8 oz sodas daily Exercise:  no Depression:  no; Anxiety:  no Other pain:  Sometimes left shoulder pain Sleep hygiene:  okay  PAST MEDICAL HISTORY: Past Medical History:  Diagnosis Date  . Atypical chest pain   . CAD (coronary artery disease)    a. Multiple prior evaluations then 05/2012 s/p BMS to OM2. b. Nuc 11/2012 wnl. c. Cath 2015: patent stent, moderate LAD/RCA dz, treated medically.  . Cancer ([place])    Hx: of prostate  . Cataract   . CKD (chronic kidney disease), stage II   . COPD (chronic obstructive pulmonary disease) ([place])   . Cough secondary to angiotensin converting enzyme inhibitor (ACE-I)   . Diabetes mellitus without complication ([place])   . Diverticulosis   . DVT (deep venous thrombosis) ([place])   . Family history of breast cancer in first degree relative   . GERD (gastroesophageal reflux disease)   . Headache(784.0)    Hx: of  . History of blood transfusion    "I've had 2; don't know what it was related to" (11/09/2012)  . HLD (hyperlipidemia)   . HTN (hypertension)   . Iron deficiency anemia   . Microcytic anemia   .  Peripheral vascular disease ([place])    a. L pop-tibial bypass 2011, followed by [facility].  . Personal history of prostate cancer   . Rotator cuff injury    "left arm; never repaired"  (11/09/2012)    PAST SURGICAL HISTORY: Past Surgical History:  Procedure Laterality Date  . CARDIOVASCULAR STRESS TEST  Aug. 2014  . CATARACT EXTRACTION W/ INTRAOCULAR LENS IMPLANT Right ~ 2008  . CIRCUMCISION    . COLONOSCOPY    . CORONARY ANGIOPLASTY WITH STENT PLACEMENT  2014   "1" (11/09/2012)  . ILIAC ARTERY ANEURYSM REPAIR    . LEFT HEART CATH  05-31-12  . LEFT HEART CATHETERIZATION WITH CORONARY ANGIOGRAM N/A 06/01/2011   Procedure: LEFT HEART CATHETERIZATION WITH CORONARY ANGIOGRAM;  Surgeon: Hillary Bow, MD;  Location: [facility] CATH LAB;  Service: Cardiovascular;  Laterality: N/A;  . LEFT HEART CATHETERIZATION WITH CORONARY ANGIOGRAM N/A 05/31/2012   Procedure: LEFT HEART CATHETERIZATION WITH CORONARY ANGIOGRAM;  Surgeon: Peter M Martinique, MD;  Location: [facility] CATH LAB;  Service: Cardiovascular;  Laterality: N/A;  . LEFT HEART CATHETERIZATION WITH CORONARY ANGIOGRAM N/A 08/01/2013   Procedure: LEFT HEART CATHETERIZATION WITH CORONARY ANGIOGRAM;  Surgeon: Burnell Blanks, MD;  Location: [facility] CATH LAB;  Service: Cardiovascular;  Laterality: N/A;  . PERCUTANEOUS CORONARY STENT INTERVENTION (PCI-S)  05/31/2012   Procedure: PERCUTANEOUS CORONARY STENT INTERVENTION (PCI-S);  Surgeon: Peter M Martinique, MD;  Location: [facility] CATH LAB;  Service: Cardiovascular;;  . PR VEIN BYPASS GRAFT,AORTO-FEM-POP Left 10/01/2009   left below knee popliteal artery to posterior tibial artery  . SHOULDER ARTHROSCOPY WITH OPEN ROTATOR CUFF REPAIR AND DISTAL CLAVICLE ACROMINECTOMY Left 01/12/2013   Procedure: LEFT SHOULDER ARTHROSCOPY WITH DEBRIDEMENT OPEN DISTAL CLAVICLE RESECTION ,acromioplastyAND ROTATOR CUFF REPAIR;  Surgeon: Yvette Rack., MD;  Location: [facility];  Service: Orthopedics;  Laterality: Left;    MEDICATIONS: Current Outpatient Medications on File Prior to Visit  Medication Sig Dispense Refill  . abiraterone Acetate (ZYTIGA) 250 MG tablet Take 1,000 mg by mouth daily before breakfast. Take on an empty  stomach 1 hour before or 2 hours after a meal     . albuterol (PROVENTIL HFA;VENTOLIN HFA) 108 (90 Base) MCG/ACT inhaler Inhale 2 puffs into the lungs every 4 (four) hours as needed for wheezing or shortness of breath. 1 Inhaler 2  . amLODipine (NORVASC) 10 MG tablet TAKE 1 TABLET BY MOUTH ONCE DAILY (Patient not taking: No sig reported) 90 tablet 1  . aspirin EC 81 MG tablet Take 81 mg by mouth daily.     . brimonidine (ALPHAGAN) 0.2 % ophthalmic solution Place 1 drop into the right eye 2 (two) times daily.   0  . diclofenac sodium (VOLTAREN) 1 % GEL Apply 2 g topically daily as needed (pain).    . ferrous sulfate 325 (65 FE) MG tablet Take 325 mg by mouth every Monday, Wednesday, and Friday.     . gabapentin (NEURONTIN) 100 MG capsule Take 100 mg by mouth daily as needed (headache).    . gabapentin (NEURONTIN) 600 MG tablet Take 1 tablet (600 mg total) by mouth 2 (two) times daily. 60 tablet 0  . isosorbide mononitrate (IMDUR) 30 MG 24 hr tablet Take 30 mg by mouth at bedtime.     Marland Kitchen losartan-hydrochlorothiazide (HYZAAR) 100-25 MG tablet Take 1 tablet by mouth daily. 30 tablet 5  . metFORMIN (GLUCOPHAGE) 500 MG tablet Take 500 mg by mouth 2 (two) times daily with a meal. Reported on 08/27/2015    . metoCLOPramide (  REGLAN) 10 MG tablet Take 1 tablet (10 mg total) by mouth every 8 (eight) hours as needed for nausea (and headache symptoms). 30 tablet 0  . Multiple Vitamin (MULTIVITAMIN WITH MINERALS) TABS tablet Take 1 tablet by mouth See admin instructions. Take one tablet by mouth every Tuesday, Thursday, Saturday morning    . nitroGLYCERIN (NITROSTAT) 0.4 MG SL tablet Place 1 tablet (0.4 mg total) under the tongue every 5 (five) minutes as needed for chest pain. 25 tablet 2  . omeprazole (PRILOSEC) 20 MG capsule Take 20 mg by mouth 2 (two) times daily before a meal.    . pravastatin (PRAVACHOL) 40 MG tablet Take 1 tablet (40 mg total) by mouth every evening. (Patient taking differently: Take 40 mg by  mouth at bedtime. ) 30 tablet 11  . predniSONE (DELTASONE) 5 MG tablet Take 5 mg by mouth daily before breakfast.     . prochlorperazine (COMPAZINE) 10 MG tablet Take 1 tablet (10 mg total) by mouth 2 (two) times daily as needed (headache). (Patient not taking: Reported on 01/20/2018) 10 tablet 0  . tamsulosin (FLOMAX) 0.4 MG CAPS capsule Take 0.4 mg at bedtime by mouth.    . traMADol (ULTRAM) 50 MG tablet take 1 tablet by mouth every 8 hours if needed FOR PAIN (Patient taking differently: Take 50 mg by mouth every 8 (eight) hours as needed (pain). ) 30 tablet 0   Current Facility-Administered Medications on File Prior to Visit  Medication Dose Route Frequency Provider Last Rate Last Dose  . 0.9 %  sodium chloride infusion  500 mL Intravenous Continuous Gatha Mayer, MD        ALLERGIES: Allergies  Allergen Reactions  . Topiramate Shortness Of Breath and Other (See Comments)     leg cramps  . Atenolol Other (See Comments)    bradycardia  . Lisinopril Cough  . Tramadol-Acetaminophen Cough    Tolerates plain Tramadol    FAMILY HISTORY: Family History  Problem Relation Age of Onset  . Breast cancer Mother        dx 26s  . Hyperlipidemia Mother   . Hypertension Mother   . COPD Father        deceased  . Hyperlipidemia Father   . Hypertension Father   . Hypertension Sister   . Cancer Brother 65       Unknown cancer, possibly stomach  . Hypertension Brother   . Cancer Brother 8       Unknown, possibly lung  . Hypertension Daughter   . Hypertension Son   . Colon cancer Neg Hx        pt is unclear about family hx    SOCIAL HISTORY: Social History   Socioeconomic History  . Marital status: Widowed    Spouse name: Not on file  . Number of children: Not on file  . Years of education: Not on file  . Highest education level: Not on file  Occupational History  . Occupation: Retired  Scientific laboratory technician  . Financial resource strain: Not on file  . Food insecurity:    Worry:  Not on file    Inability: Not on file  . Transportation needs:    Medical: Not on file    Non-medical: Not on file  Tobacco Use  . Smoking status: Former Smoker    Packs/day: 0.00    Types: Cigarettes    Last attempt to quit: 05/26/2011    Years since quitting: 6.6  . Smokeless tobacco: Never Used  Substance and Sexual Activity  . Alcohol use: No  . Drug use: No  . Sexual activity: Not on file  Lifestyle  . Physical activity:    Days per week: Not on file    Minutes per session: Not on file  . Stress: Not on file  Relationships  . Social connections:    Talks on phone: Not on file    Gets together: Not on file    Attends religious service: Not on file    Active member of club or organization: Not on file    Attends meetings of clubs or organizations: Not on file    Relationship status: Not on file  . Intimate partner violence:    Fear of current or ex partner: Not on file    Emotionally abused: Not on file    Physically abused: Not on file    Forced sexual activity: Not on file  Other Topics Concern  . 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  Lives in [place] by himself.    REVIEW OF SYSTEMS: Constitutional: No fevers, chills, or sweats, no generalized fatigue, change in appetite Eyes: No visual changes, double vision, eye pain Ear, nose and throat: No hearing loss, ear pain, nasal congestion, sore throat Cardiovascular: No chest pain, palpitations Respiratory:  No shortness of breath at rest or with exertion, wheezes GastrointestinaI: No nausea, vomiting, diarrhea, abdominal pain, fecal incontinence Genitourinary:  No dysuria, urinary retention or frequency Musculoskeletal:  No neck pain, back pain Integumentary: No rash, pruritus, skin lesions Neurological: as above Psychiatric: No depression, insomnia, anxiety Endocrine: No palpitations, fatigue, diaphoresis, mood swings, change in appetite, change in weight, increased  thirst Hematologic/Lymphatic:  No purpura, petechiae. Allergic/Immunologic: no itchy/runny eyes, nasal congestion, recent allergic reactions, rashes  PHYSICAL EXAM: Blood pressure (!) 130/58, pulse 71, height 5\' 10"  (1.778 m), weight 242 lb (109.8 kg), SpO2 98 %. General: No acute distress.  Patient appears well-groomed.  Head:  Normocephalic/atraumatic Eyes:  fundi examined but not visualized Neck: supple, no paraspinal tenderness, full range of motion Back: No paraspinal tenderness Heart: regular rate and rhythm Lungs: Clear to auscultation bilaterally. Vascular: No carotid bruits. Neurological Exam: Mental status: alert and oriented to person, place, and time, recent and remote memory intact, fund of knowledge intact, attention and concentration intact, speech fluent and not dysarthric, language intact. Cranial nerves: CN I: not tested CN II: pupils equal, round and reactive to light, visual fields intact CN III, IV, VI:  full range of motion, no nystagmus, no ptosis CN V: facial sensation intact CN VII: upper and lower face symmetric CN VIII: hearing intact CN IX, X: gag intact, uvula midline CN XI: sternocleidomastoid and trapezius muscles intact CN XII: tongue midline Bulk & Tone: normal, no fasciculations. Motor:  5/5 throughout  Sensation: temperature and vibration sensation intact. Deep Tendon Reflexes:  2+ throughout, toes downgoing.  Finger to nose testing:  Without dysmetria.  Heel to shin:  Without dysmetria.  Gait:  Normal station and stride.  Able to turn and tandem walk. Romberg negative.  IMPRESSION: Chronic tension-type headache, not intractable  PLAN: 1.  Start sertraline 25mg  daily for 1 week, then increase to 50mg  daily.  We can increase dose to 75mg  daily in one month if needed. 2.  Limit use of pain relievers (Tylenol) to no more than 2 days out of week to prevent risk of rebound or medication-overuse headache. 3.  Keep headache diary 4.  Exercise,  caffeine cessation, hydration 5.  Follow up in 4 months.   Thank you for allowing me to take part in the care of this patient.  Metta Clines, DO  CC:  [name], NP-C

## 2018-01-30 NOTE — Telephone Encounter (Signed)
Rescheduled patient for evaluation with Dr. Angelena Form 10/23. He was grateful for call and agrees with treatment plan.

## 2018-01-30 NOTE — Telephone Encounter (Signed)
New message   Per Patient states that he needs a f/u appt after being released from the hospital within the next few days. Dr. Angelena Form does not have anything available until November 11, which he is already scheduled. Ellen Henri has an available appt on 02/09/2018. He states that he does not want to wait this long. He wants to see if Dr. Angelena Form can work him into to his schedule. Please call to discuss.

## 2018-01-31 ENCOUNTER — Ambulatory Visit: Payer: PPO | Admitting: Neurology

## 2018-01-31 ENCOUNTER — Encounter: Payer: Self-pay | Admitting: Neurology

## 2018-01-31 ENCOUNTER — Other Ambulatory Visit: Payer: Self-pay | Admitting: Neurology

## 2018-01-31 ENCOUNTER — Encounter

## 2018-01-31 VITALS — BP 130/58 | HR 71 | Ht 70.0 in | Wt 242.0 lb

## 2018-01-31 DIAGNOSIS — G44229 Chronic tension-type headache, not intractable: Secondary | ICD-10-CM

## 2018-01-31 MED ORDER — SERTRALINE HCL 25 MG PO TABS: ORAL_TABLET | ORAL | 0 refills | Status: DC

## 2018-01-31 NOTE — Patient Instructions (Signed)
1.  Start sertraline 25mg .  Take 1 tablet daily for 7 days, then increase to 2 tablets daily.  If headaches not improved in 5 weeks, then contact me and we can increase dose. 2.  Limit use of pain relievers (Tylenol) to no more than 2 days out of week to prevent risk of rebound or medication-overuse headache. 3.  Keep headache diary 4.  Follow up in 4 months.

## 2018-02-01 ENCOUNTER — Ambulatory Visit: Payer: PPO | Admitting: Cardiovascular Disease

## 2018-02-01 ENCOUNTER — Encounter: Payer: Self-pay | Admitting: Cardiovascular Disease

## 2018-02-01 VITALS — BP 122/62 | HR 70 | Ht 70.0 in | Wt 241.0 lb

## 2018-02-01 DIAGNOSIS — I10 Essential (primary) hypertension: Secondary | ICD-10-CM | POA: Diagnosis not present

## 2018-02-01 DIAGNOSIS — I25118 Atherosclerotic heart disease of native coronary artery with other forms of angina pectoris: Secondary | ICD-10-CM

## 2018-02-01 DIAGNOSIS — E7800 Pure hypercholesterolemia, unspecified: Secondary | ICD-10-CM | POA: Diagnosis not present

## 2018-02-01 NOTE — Progress Notes (Signed)
Chief Complaint  Patient presents with  . Follow-up    CAD     History of Present Illness: 77 yo male with history of HTN, HLD, CAD, PAD, DM who is here today for cardiac follow up. He has been followed in the past by Dr. Lia Foyer. Cardiac cath February 2014  severe OM2 stenosis treated with bare metal stent. Admitted to [facility] 11/09/12 with chest pain. Ruled out for MI. Lexiscan stress myoview 11/16/12 without ischemia. Normal LVEF.  I saw him in the office 07/27/13 and he c/o exertional chest pain c/w unstable angina. Cardiac cath 08/01/13 with patent stent Circumflex and mild to moderate disease in the LAD and RCA. His PAD is followed in [facility] by Dr. Scot Dock. He was seen here 03/04/14 and had c/o chest pain after working on his car but quickly resolved. He was seen in the ED 09/10/14 with atypical chest pain. Troponin was negative x 2 and EKG was unchanged. Multiple ED visits with chest pain in 2017. Nuclear stress test November 2018 with  EF 55% with moderate inferior defect that partially improves consistent with ischemia and possible soft tissue attenuation. Small region of anterior anterior septal ischemia. Diagnosed with prostate cancer in 2019. I saw him in the ED 07/15/17 and he had atypical chest pain with negative troponin and no EKG changes. He was seen in my office 08/01/17 and doing well. Most recently seen in the ED 01/28/18 with atypical chest pain, negative troponin.   He is here today for follow up. The patient denies any chest pain over the past 3 days. He thinks he strained a muscle helping his daughter move last week. No dyspnea, palpitations, lower extremity edema, orthopnea, PND, dizziness, near syncope or syncope.   Primary Care Physician:  Girtha Rm, NP-C  Past Medical History:  Diagnosis Date  . Atypical chest pain   . CAD (coronary artery disease)    a. Multiple prior evaluations then 05/2012 s/p BMS to OM2. b. Nuc 11/2012 wnl. c. Cath 2015: patent stent, moderate LAD/RCA  dz, treated medically.  . Cancer ([place])    Hx: of prostate  . Cataract   . CKD (chronic kidney disease), stage II   . COPD (chronic obstructive pulmonary disease) ([place])   . Cough secondary to angiotensin converting enzyme inhibitor (ACE-I)   . Diabetes mellitus without complication ([place])   . Diverticulosis   . DVT (deep venous thrombosis) ([place])   . Family history of breast cancer in first degree relative   . GERD (gastroesophageal reflux disease)   . Headache(784.0)    Hx: of  . History of blood transfusion    "I've had 2; don't know what it was related to" (11/09/2012)  . HLD (hyperlipidemia)   . HTN (hypertension)   . Iron deficiency anemia   . Microcytic anemia   . Peripheral vascular disease ([place])    a. L pop-tibial bypass 2011, followed by [facility].  . Personal history of prostate cancer   . Rotator cuff injury    "left arm; never repaired" (11/09/2012)    Past Surgical History:  Procedure Laterality Date  . CARDIOVASCULAR STRESS TEST  Aug. 2014  . CATARACT EXTRACTION W/ INTRAOCULAR LENS IMPLANT Right ~ 2008  . CIRCUMCISION    . COLONOSCOPY    . CORONARY ANGIOPLASTY WITH STENT PLACEMENT  2014   "1" (11/09/2012)  . ILIAC ARTERY ANEURYSM REPAIR    . LEFT HEART CATH  05-31-12  . LEFT HEART CATHETERIZATION WITH CORONARY ANGIOGRAM N/A  06/01/2011   Procedure: LEFT HEART CATHETERIZATION WITH CORONARY ANGIOGRAM;  Surgeon: Hillary Bow, MD;  Location: [facility] CATH LAB;  Service: Cardiovascular;  Laterality: N/A;  . LEFT HEART CATHETERIZATION WITH CORONARY ANGIOGRAM N/A 05/31/2012   Procedure: LEFT HEART CATHETERIZATION WITH CORONARY ANGIOGRAM;  Surgeon: Peter M Martinique, MD;  Location: [facility] CATH LAB;  Service: Cardiovascular;  Laterality: N/A;  . LEFT HEART CATHETERIZATION WITH CORONARY ANGIOGRAM N/A 08/01/2013   Procedure: LEFT HEART CATHETERIZATION WITH CORONARY ANGIOGRAM;  Surgeon: Burnell Blanks, MD;  Location: [facility] CATH LAB;  Service: Cardiovascular;  Laterality: N/A;  . PERCUTANEOUS  CORONARY STENT INTERVENTION (PCI-S)  05/31/2012   Procedure: PERCUTANEOUS CORONARY STENT INTERVENTION (PCI-S);  Surgeon: Peter M Martinique, MD;  Location: [facility] CATH LAB;  Service: Cardiovascular;;  . PR VEIN BYPASS GRAFT,AORTO-FEM-POP Left 10/01/2009   left below knee popliteal artery to posterior tibial artery  . SHOULDER ARTHROSCOPY WITH OPEN ROTATOR CUFF REPAIR AND DISTAL CLAVICLE ACROMINECTOMY Left 01/12/2013   Procedure: LEFT SHOULDER ARTHROSCOPY WITH DEBRIDEMENT OPEN DISTAL CLAVICLE RESECTION ,acromioplastyAND ROTATOR CUFF REPAIR;  Surgeon: Yvette Rack., MD;  Location: [facility];  Service: Orthopedics;  Laterality: Left;    Current Outpatient Medications  Medication Sig Dispense Refill  . abiraterone Acetate (ZYTIGA) 250 MG tablet Take 1,000 mg by mouth daily before breakfast. Take on an empty stomach 1 hour before or 2 hours after a meal     . albuterol (PROVENTIL HFA;VENTOLIN HFA) 108 (90 Base) MCG/ACT inhaler Inhale 2 puffs into the lungs every 4 (four) hours as needed for wheezing or shortness of breath. 1 Inhaler 2  . amLODipine (NORVASC) 10 MG tablet TAKE 1 TABLET BY MOUTH ONCE DAILY 90 tablet 1  . aspirin EC 81 MG tablet Take 81 mg by mouth daily.     . brimonidine (ALPHAGAN) 0.2 % ophthalmic solution Place 1 drop into the right eye 2 (two) times daily.   0  . diclofenac sodium (VOLTAREN) 1 % GEL Apply 2 g topically daily as needed (pain).    . ferrous sulfate 325 (65 FE) MG tablet Take 325 mg by mouth every Monday, Wednesday, and Friday.     . isosorbide mononitrate (IMDUR) 30 MG 24 hr tablet Take 30 mg by mouth at bedtime.     Marland Kitchen losartan-hydrochlorothiazide (HYZAAR) 100-25 MG tablet Take 1 tablet by mouth daily. 30 tablet 5  . metFORMIN (GLUCOPHAGE) 500 MG tablet Take 500 mg by mouth 2 (two) times daily with a meal. Reported on 08/27/2015    . Multiple Vitamin (MULTIVITAMIN WITH MINERALS) TABS tablet Take 1 tablet by mouth See admin instructions. Take one tablet by mouth every Tuesday,  Thursday, Saturday morning    . nitroGLYCERIN (NITROSTAT) 0.4 MG SL tablet Place 1 tablet (0.4 mg total) under the tongue every 5 (five) minutes as needed for chest pain. 25 tablet 2  . omeprazole (PRILOSEC) 20 MG capsule Take 20 mg by mouth 2 (two) times daily before a meal.    . pravastatin (PRAVACHOL) 40 MG tablet Take 40 mg by mouth at bedtime.    . predniSONE (DELTASONE) 5 MG tablet Take 5 mg by mouth daily before breakfast.     . sertraline (ZOLOFT) 25 MG tablet TAKE 1 TABLET BY MOUTH DAILY FOR 7 DAYS. INCREASE TO 2 TABLETS DAILY 180 tablet 0  . tamsulosin (FLOMAX) 0.4 MG CAPS capsule Take 0.4 mg at bedtime by mouth.    . traMADol (ULTRAM) 50 MG tablet Take by mouth every 8 (eight) hours as needed  for severe pain.     Current Facility-Administered Medications  Medication Dose Route Frequency Provider Last Rate Last Dose  . 0.9 %  sodium chloride infusion  500 mL Intravenous Continuous Gatha Mayer, MD        Allergies  Allergen Reactions  . Topiramate Shortness Of Breath and Other (See Comments)     leg cramps  . Atenolol Other (See Comments)    bradycardia  . Lisinopril Cough  . Tramadol-Acetaminophen Cough    Tolerates plain Tramadol    Social History   Socioeconomic History  . Marital status: Widowed    Spouse name: Not on file  . Number of children: Not on file  . Years of education: Not on file  . Highest education level: 12th grade  Occupational History  . Occupation: Retired  Scientific laboratory technician  . Financial resource strain: Not on file  . Food insecurity:    Worry: Not on file    Inability: Not on file  . Transportation needs:    Medical: Not on file    Non-medical: Not on file  Tobacco Use  . Smoking status: Former Smoker    Packs/day: 0.00    Types: Cigarettes    Last attempt to quit: 05/26/2011    Years since quitting: 6.6  . Smokeless tobacco: Never Used  Substance and Sexual Activity  . Alcohol use: No  . Drug use: No  . Sexual activity: Not on file    Lifestyle  . Physical activity:    Days per week: Not on file    Minutes per session: Not on file  . Stress: Not on file  Relationships  . Social connections:    Talks on phone: Not on file    Gets together: Not on file    Attends religious service: Not on file    Active member of club or organization: Not on file    Attends meetings of clubs or organizations: Not on file    Relationship status: Not on file  . Intimate partner violence:    Fear of current or ex partner: Not on file    Emotionally abused: Not on file    Physically abused: Not on file    Forced sexual activity: Not on file  Other Topics Concern  . 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  Lives in [place] by himself.      Patient writes with his right hand, though uses his left hand for every thing else. He lives alone in a one level home. He drinks 3 cups of coffee a day and 3 7-8 oz sodas a day. He does not exercise.    Family History  Problem Relation Age of Onset  . Breast cancer Mother        dx 73s  . Hyperlipidemia Mother   . Hypertension Mother   . COPD Father        deceased  . Hyperlipidemia Father   . Hypertension Father   . Hypertension Sister   . Cancer Brother 79       Unknown cancer, possibly stomach  . Hypertension Brother   . Cancer Brother 67       Unknown, possibly lung  . Hypertension Daughter   . Hypertension Son   . Colon cancer Neg Hx        pt is unclear about family hx    Review of Systems:  As stated in the HPI and  otherwise negative.   BP 122/62   Pulse 70   Ht 5\' 10"  (1.778 m)   Wt 241 lb (109.3 kg)   SpO2 97%   BMI 34.58 kg/m   Physical Examination:  General: Well developed, well nourished, NAD  HEENT: OP clear, mucus membranes moist  SKIN: warm, dry. No rashes. Neuro: No focal deficits  Musculoskeletal: Muscle strength 5/5 all ext  Psychiatric: Mood and affect normal  Neck: No JVD, no carotid bruits, no  thyromegaly, no lymphadenopathy.  Lungs:Clear bilaterally, no wheezes, rhonci, crackles Cardiovascular: Regular rate and rhythm. No murmurs, gallops or rubs. Abdomen:Soft. Bowel sounds present. Non-tender.  Extremities: No lower extremity edema. Pulses are 2 + in the bilateral DP/PT.   Cardiac cath 08/01/13: Left main: No obstructive disease.  Left Anterior Descending Artery: Large caliber vessel that courses to the apex. The proximal and mid vessel is ectatic. There is diffuse 30-40% stenosis in the mid vessel. There are mild luminal irregularities in the distal vessel.  Circumflex Artery: Large caliber vessel with two obtuse marginal branches. The first OM branch is small in caliber and is chronically occluded in the mid vessel. The second OM branch is patent with patent stent, minimal restenosis. The distal segment of the OM branch has diffuse plaque.  Right Coronary Artery: Large caliber dominant vessel with proximal Shepherd's crook. The proximal vessel has diffuse 40% stenosis. The mid vessel is ectatic with 40% stenosis. The distal vessel has diffuse 50% stenosis. The PDA has 50% proximal stenosis followed by diffuse plaque.  Left Ventricular Angiogram: LVEF=65%.   EKG:  EKG is not ordered today. The ekg ordered today demonstrates   Recent Labs: 07/15/2017: B Natriuretic Peptide 12.4 10/24/2017: ALT 13 01/28/2018: BUN 11; Creatinine, Ser 1.28; Hemoglobin 9.3; Platelets 193; Potassium 4.0; Sodium 136    Wt Readings from Last 3 Encounters:  02/01/18 241 lb (109.3 kg)  01/31/18 242 lb (109.8 kg)  01/20/18 242 lb 9.6 oz (110 kg)     Other studies Reviewed: Additional studies/ records that were reviewed today include: . Review of the above records demonstrates:    Assessment and Plan:   1. CAD with stable angina: He has chronic chest pain. Most recent episode on 01/28/18 and resolved quickly. He was seen in the ED and no objective of ischemia. No chest pain since then. He thinks he  strained a muscle helping his daughter move. The pain has not recurred. Most recent cath 2015 with stable CAD. Nuclear stress test in November 2018 with no large areas of ischemia. Continue ASA, statin, Imdur and Norvasc.   2. HTN: BP is controlled. No changes  3. Hyperlipidemia: Lipids followed in primary care.  Continue statin.   Current medicines are reviewed at length with the patient today.  The patient does not have concerns regarding medicines.  The following changes have been made:  no change  Labs/ tests ordered today include:   No orders of the defined types were placed in this encounter.   Disposition:   FU with me in  6 months  Signed, Lauree Chandler, MD 02/01/2018 12:31 PM    [address], [place], [place]  [address] Phone: [phone]; Fax: [phone]

## 2018-02-01 NOTE — Patient Instructions (Signed)

## 2018-02-11 ENCOUNTER — Ambulatory Visit (HOSPITAL_COMMUNITY)
Admission: EM | Admit: 2018-02-11 | Discharge: 2018-02-11 | Disposition: A | Discharge status: Home / Self Care | Payer: PPO | Attending: Family Medicine | Admitting: Family Medicine

## 2018-02-11 ENCOUNTER — Encounter (HOSPITAL_COMMUNITY): Payer: Self-pay | Admitting: Emergency Medicine

## 2018-02-11 ENCOUNTER — Other Ambulatory Visit: Payer: Self-pay

## 2018-02-11 DIAGNOSIS — R05 Cough: Secondary | ICD-10-CM | POA: Diagnosis not present

## 2018-02-11 DIAGNOSIS — K529 Noninfective gastroenteritis and colitis, unspecified: Secondary | ICD-10-CM

## 2018-02-11 DIAGNOSIS — R059 Cough, unspecified: Secondary | ICD-10-CM

## 2018-02-11 MED ORDER — ONDANSETRON HCL 4 MG PO TABS: 4.0000 mg | ORAL_TABLET | Freq: Three times a day (TID) | ORAL | 0 refills | Status: DC | PRN

## 2018-02-11 MED ORDER — BENZONATATE 100 MG PO CAPS: 100.0000 mg | ORAL_CAPSULE | Freq: Three times a day (TID) | ORAL | 0 refills | Status: DC | PRN

## 2018-02-11 NOTE — ED Triage Notes (Signed)
Vomiting started last night and stopped around 3:30am today.  Patient is holding down liquids  Patient has a cough that concerns him

## 2018-02-11 NOTE — Discharge Instructions (Signed)
If no improvement within 48 hours, please follow-up with your Primary Care provider

## 2018-02-11 NOTE — ED Provider Notes (Signed)
[place]    CSN: [id number] Arrival date & time: 02/11/18  1629     History   Chief Complaint Chief Complaint  Patient presents with  . Emesis  . Cough    HPI Joel [name] is a 77 y.o. male.   HPI Patient complains of less than 24 hours of one episode of nausea with vomiting.  He has had at least 2 episodes of watery diarrhea.  He denies abdominal pain or tarry stools. Denies eating any new foods or any household sick contacts.  He is under treatment of oncology for prostate cancer. He reports a chronic cough which is not new nonproductive and although when the cough occurs, it is cyclic. Denies any associated URI symptoms. Suffers from COPD and is followed by speciality. He has attempted relief of cough with Robitussin without moderate improvement. No shortness of breath, chest pain, decreased activity, or weakness. . Past Medical History:  Diagnosis Date  . Atypical chest pain   . CAD (coronary artery disease)    a. Multiple prior evaluations then 05/2012 s/p BMS to OM2. b. Nuc 11/2012 wnl. c. Cath 2015: patent stent, moderate LAD/RCA dz, treated medically.  . Cancer ([place])    Hx: of prostate  . Cataract   . CKD (chronic kidney disease), stage II   . COPD (chronic obstructive pulmonary disease) ([place])   . Cough secondary to angiotensin converting enzyme inhibitor (ACE-I)   . Diabetes mellitus without complication ([place])   . Diverticulosis   . DVT (deep venous thrombosis) ([place])   . Family history of breast cancer in first degree relative   . GERD (gastroesophageal reflux disease)   . Headache(784.0)    Hx: of  . History of blood transfusion    "I've had 2; don't know what it was related to" (11/09/2012)  . HLD (hyperlipidemia)   . HTN (hypertension)   . Iron deficiency anemia   . Microcytic anemia   . Peripheral vascular disease ([place])    a. L pop-tibial bypass 2011, followed by [facility].  . Personal history of prostate cancer   . Rotator cuff injury    "left arm; never repaired" (11/09/2012)    Patient Active Problem List   Diagnosis Date Noted  . Dizziness 01/20/2018  . Malaise 01/20/2018  . Decreased breath sounds 01/20/2018  . Needs flu shot 01/20/2018  . Chronic nonintractable headache 12/07/2017  . Back pain 11/01/2017  . Radicular pain of lower extremity 11/01/2017  . Prostate cancer ([name]) 11/01/2017  . Family history of breast cancer in first degree relative   . Personal history of prostate cancer   . Elliptocytosis on peripheral blood smear ([place]) 09/13/2016  . Prediabetes 03/22/2016  . PVD (peripheral vascular disease) with claudication ([place]) 11/15/2012  . Chest pain, localized 11/10/2012  . Hypokalemia 11/10/2012  . Bradycardia 11/09/2012  . Thrombocytopenia ([place]) 06/01/2012  . Microcytic anemia   . Atherosclerosis of native arteries of the extremities with intermittent claudication 06/16/2011  . Stable angina ([place]) 06/11/2011  . CAD (coronary artery disease)   . Chest pain, atypical 05/30/2011  . Iron deficiency anemia 10/03/2009  . DIVERTICULOSIS OF COLON 10/03/2009  . WEIGHT LOSS, ABNORMAL 10/03/2009  . CHEST PAIN UNSPECIFIED 09/13/2008  . Headache(784.0) 08/19/2008  . TIA 06/05/2008  . SHOULDER PAIN, LEFT 10/11/2007  . COUGH DUE TO ACE INHIBITORS 05/07/2007  . History of tobacco use disorder 04/25/2007  . Hyperlipidemia 04/21/2007  . Essential hypertension 04/21/2007  . GERD 04/21/2007    Past Surgical  History:  Procedure Laterality Date  . CARDIOVASCULAR STRESS TEST  Aug. 2014  . CATARACT EXTRACTION W/ INTRAOCULAR LENS IMPLANT Right ~ 2008  . CIRCUMCISION    . COLONOSCOPY    . CORONARY ANGIOPLASTY WITH STENT PLACEMENT  2014   "1" (11/09/2012)  . ILIAC ARTERY ANEURYSM REPAIR    . LEFT HEART CATH  05-31-12  . LEFT HEART CATHETERIZATION WITH CORONARY ANGIOGRAM N/A 06/01/2011   Procedure: LEFT HEART CATHETERIZATION WITH CORONARY ANGIOGRAM;  Surgeon: Hillary Bow, MD;  Location: [facility] CATH LAB;  Service:  Cardiovascular;  Laterality: N/A;  . LEFT HEART CATHETERIZATION WITH CORONARY ANGIOGRAM N/A 05/31/2012   Procedure: LEFT HEART CATHETERIZATION WITH CORONARY ANGIOGRAM;  Surgeon: Peter M Martinique, MD;  Location: [facility] CATH LAB;  Service: Cardiovascular;  Laterality: N/A;  . LEFT HEART CATHETERIZATION WITH CORONARY ANGIOGRAM N/A 08/01/2013   Procedure: LEFT HEART CATHETERIZATION WITH CORONARY ANGIOGRAM;  Surgeon: Burnell Blanks, MD;  Location: [facility] CATH LAB;  Service: Cardiovascular;  Laterality: N/A;  . PERCUTANEOUS CORONARY STENT INTERVENTION (PCI-S)  05/31/2012   Procedure: PERCUTANEOUS CORONARY STENT INTERVENTION (PCI-S);  Surgeon: Peter M Martinique, MD;  Location: [facility] CATH LAB;  Service: Cardiovascular;;  . PR VEIN BYPASS GRAFT,AORTO-FEM-POP Left 10/01/2009   left below knee popliteal artery to posterior tibial artery  . SHOULDER ARTHROSCOPY WITH OPEN ROTATOR CUFF REPAIR AND DISTAL CLAVICLE ACROMINECTOMY Left 01/12/2013   Procedure: LEFT SHOULDER ARTHROSCOPY WITH DEBRIDEMENT OPEN DISTAL CLAVICLE RESECTION ,acromioplastyAND ROTATOR CUFF REPAIR;  Surgeon: Yvette Rack., MD;  Location: [facility];  Service: Orthopedics;  Laterality: Left;       Home Medications    Prior to Admission medications   Medication Sig Start Date End Date Taking? Authorizing Provider  abiraterone Acetate (ZYTIGA) 250 MG tablet Take 1,000 mg by mouth daily before breakfast. Take on an empty stomach 1 hour before or 2 hours after a meal     [provider]  albuterol (PROVENTIL HFA;VENTOLIN HFA) 108 (90 Base) MCG/ACT inhaler Inhale 2 puffs into the lungs every 4 (four) hours as needed for wheezing or shortness of breath. 01/28/18   Agyei, Caprice Kluver, MD  amLODipine (NORVASC) 10 MG tablet TAKE 1 TABLET BY MOUTH ONCE DAILY 11/07/17   Henson, Vickie L, NP-C  aspirin EC 81 MG tablet Take 81 mg by mouth daily.     [provider]  brimonidine (ALPHAGAN) 0.2 % ophthalmic solution Place 1 drop into the right eye 2 (two)  times daily.  09/13/17   [provider]  diclofenac sodium (VOLTAREN) 1 % GEL Apply 2 g topically daily as needed (pain).    [provider]  ferrous sulfate 325 (65 FE) MG tablet Take 325 mg by mouth every Monday, Wednesday, and Friday.     [provider]  isosorbide mononitrate (IMDUR) 30 MG 24 hr tablet Take 30 mg by mouth at bedtime.     [provider]  losartan-hydrochlorothiazide (HYZAAR) 100-25 MG tablet Take 1 tablet by mouth daily. 01/20/18   Tysinger, Camelia Eng, PA-C  metFORMIN (GLUCOPHAGE) 500 MG tablet Take 500 mg by mouth 2 (two) times daily with a meal. Reported on 08/27/2015    [provider]  Multiple Vitamin (MULTIVITAMIN WITH MINERALS) TABS tablet Take 1 tablet by mouth See admin instructions. Take one tablet by mouth every Tuesday, Thursday, Saturday morning    [provider]  nitroGLYCERIN (NITROSTAT) 0.4 MG SL tablet Place 1 tablet (0.4 mg total) under the tongue every 5 (five) minutes as needed  for chest pain. 12/08/15   Imogene Burn, PA-C  omeprazole (PRILOSEC) 20 MG capsule Take 20 mg by mouth 2 (two) times daily before a meal.    [provider]  pravastatin (PRAVACHOL) 40 MG tablet Take 40 mg by mouth at bedtime.    [provider]  predniSONE (DELTASONE) 5 MG tablet Take 5 mg by mouth daily before breakfast.     [provider]  sertraline (ZOLOFT) 25 MG tablet TAKE 1 TABLET BY MOUTH DAILY FOR 7 DAYS. INCREASE TO 2 TABLETS DAILY 01/31/18   Pieter Partridge, DO  tamsulosin (FLOMAX) 0.4 MG CAPS capsule Take 0.4 mg at bedtime by mouth.    [provider]  traMADol (ULTRAM) 50 MG tablet Take by mouth every 8 (eight) hours as needed for severe pain.    [provider]    Family History Family History  Problem Relation Age of Onset  . Breast cancer Mother        dx 74s  . Hyperlipidemia Mother   . Hypertension Mother   . COPD Father        deceased  . Hyperlipidemia Father    . Hypertension Father   . Hypertension Sister   . Cancer Brother 13       Unknown cancer, possibly stomach  . Hypertension Brother   . Cancer Brother 41       Unknown, possibly lung  . Hypertension Daughter   . Hypertension Son   . Colon cancer Neg Hx        pt is unclear about family hx    Social History Social History   Tobacco Use  . Smoking status: Former Smoker    Packs/day: 0.00    Types: Cigarettes    Last attempt to quit: 05/26/2011    Years since quitting: 6.7  . Smokeless tobacco: Never Used  Substance Use Topics  . Alcohol use: No  . Drug use: No     Allergies   Topiramate; Atenolol; Lisinopril; and Tramadol-acetaminophen   Review of Systems Review of Systems Pertinent negatives listed in HPI  Physical Exam Triage Vital Signs ED Triage Vitals  Enc Vitals Group     BP 02/11/18 1718 (!) 144/66     Pulse Rate 02/11/18 1718 80     Resp 02/11/18 1718 18     Temp 02/11/18 1718 98.7 F (37.1 C)     Temp Source 02/11/18 1718 Oral     SpO2 02/11/18 1718 97 %     Weight --      Height --      Head Circumference --      Peak Flow --      Pain Score 02/11/18 1715 2     Pain Loc --      Pain Edu? --      Excl. in [place]? --    No data found.  Updated Vital Signs BP (!) 144/66 (BP Location: Left Arm) Comment: has not taken any medicines today  Pulse 80   Temp 98.7 F (37.1 C) (Oral)   Resp 18   SpO2 97%   Visual Acuity Right Eye Distance:   Left Eye Distance:   Bilateral Distance:    Right Eye Near:   Left Eye Near:    Bilateral Near:     Physical Exam Physical Exam: Constitutional: Patient appears well-developed and well-nourished. No distress. CVS: RRR, S1/S2 +, no murmurs, no gallops, no carotid bruit.  Pulmonary: Effort and breath sounds normal,  no stridor, rhonchi, wheezes, rales.  Abdominal: Soft. BS +, no distension, tenderness, rebound or guarding.  Musculoskeletal: Normal range of motion. No edema and no tenderness.  Neuro: Alert.  Normal reflexes, muscle tone coordination. No cranial nerve deficit. Skin: Skin is warm and dry. No rash noted. Not diaphoretic. No erythema. No pallor. Psychiatric: Normal mood and affect. Behavior, judgment, thought content normal. UC Treatments / Results  Labs (all labs ordered are listed, but only abnormal results are displayed) Labs Reviewed - No data to display  EKG None  Radiology No results found.  Procedures Procedures (including critical care time)  Medications Ordered in UC Medications - No data to display  Initial Impression / Assessment and Plan / UC Course  I have reviewed the triage vital signs and the nursing notes.  Pertinent labs & imaging results that were available during my care of the patient were reviewed by me and considered in my medical decision making (see chart for details).   Patient presents today, well-appearing, without distress with a complaint of nausea with vomiting occurring over 12 hours ago. He is tolerating fluids and eating small amounts. Illness likely related to acute gastroenteritis. Will provide antiemetics in the event nausea reoccurs.  Cough is likely secondary to chronic lung disease. Will trial benzonatate 100-200 mg to manage cough. Recommended follow-up with PCP if cough continues or doesn't improve.An After Visit Summary was printed and given to the patient. Precautions discussed.Red flags discussed. Questions invited and answered. They voiced understanding and agreement.   Final Clinical Impressions(s) / UC Diagnoses   Final diagnoses:  Cough  Gastroenteritis     Discharge Instructions     If no improvement within 48 hours, please follow-up with your Primary Care provider     ED Prescriptions    Medication Sig Dispense Auth. Provider   benzonatate (TESSALON) 100 MG capsule Take 1-2 capsules (100-200 mg total) by mouth 3 (three) times daily as needed for cough. 60 capsule Scot Jun, FNP   ondansetron (ZOFRAN)  4 MG tablet Take 1 tablet (4 mg total) by mouth every 8 (eight) hours as needed for nausea or vomiting. 12 tablet Scot Jun, FNP     Controlled Substance Prescriptions [place] Controlled Substance Registry consulted? Not Applicable   Scot Jun, FNP 02/14/18 2027

## 2018-02-13 ENCOUNTER — Other Ambulatory Visit: Payer: Self-pay

## 2018-02-13 ENCOUNTER — Encounter (HOSPITAL_COMMUNITY): Payer: Self-pay | Admitting: Emergency Medicine

## 2018-02-13 ENCOUNTER — Emergency Department (HOSPITAL_COMMUNITY)
Admission: EM | Admit: 2018-02-13 | Discharge: 2018-02-13 | Disposition: A | Discharge status: Home / Self Care | Payer: PPO | Attending: Emergency Medicine | Admitting: Emergency Medicine

## 2018-02-13 DIAGNOSIS — N182 Chronic kidney disease, stage 2 (mild): Secondary | ICD-10-CM | POA: Diagnosis not present

## 2018-02-13 DIAGNOSIS — R51 Headache: Secondary | ICD-10-CM | POA: Diagnosis not present

## 2018-02-13 DIAGNOSIS — Z7982 Long term (current) use of aspirin: Secondary | ICD-10-CM | POA: Insufficient documentation

## 2018-02-13 DIAGNOSIS — Z87891 Personal history of nicotine dependence: Secondary | ICD-10-CM | POA: Diagnosis not present

## 2018-02-13 DIAGNOSIS — Z7984 Long term (current) use of oral hypoglycemic drugs: Secondary | ICD-10-CM | POA: Diagnosis not present

## 2018-02-13 DIAGNOSIS — R7303 Prediabetes: Secondary | ICD-10-CM | POA: Insufficient documentation

## 2018-02-13 DIAGNOSIS — J449 Chronic obstructive pulmonary disease, unspecified: Secondary | ICD-10-CM | POA: Diagnosis not present

## 2018-02-13 DIAGNOSIS — H9312 Tinnitus, left ear: Secondary | ICD-10-CM | POA: Diagnosis not present

## 2018-02-13 DIAGNOSIS — I129 Hypertensive chronic kidney disease with stage 1 through stage 4 chronic kidney disease, or unspecified chronic kidney disease: Secondary | ICD-10-CM | POA: Diagnosis not present

## 2018-02-13 DIAGNOSIS — Z8546 Personal history of malignant neoplasm of prostate: Secondary | ICD-10-CM | POA: Insufficient documentation

## 2018-02-13 DIAGNOSIS — Z79899 Other long term (current) drug therapy: Secondary | ICD-10-CM | POA: Diagnosis not present

## 2018-02-13 DIAGNOSIS — I440 Atrioventricular block, first degree: Secondary | ICD-10-CM | POA: Diagnosis not present

## 2018-02-13 DIAGNOSIS — I2510 Atherosclerotic heart disease of native coronary artery without angina pectoris: Secondary | ICD-10-CM | POA: Insufficient documentation

## 2018-02-13 LAB — SALICYLATE LEVEL: Salicylate Lvl: <7 mg/dL (ref 2.8–30.0)

## 2018-02-13 LAB — CBC WITH DIFFERENTIAL/PLATELET
Abs Immature Granulocytes: 0.02 10*3/uL (ref 0.00–0.07)
Basophils Absolute: 0 10*3/uL (ref 0.0–0.1)
Basophils Relative: 0 %
Eosinophils Absolute: 0.3 10*3/uL (ref 0.0–0.5)
Eosinophils Relative: 5 %
HCT: 33 % — ABNORMAL LOW (ref 39.0–52.0)
Hemoglobin: 9.7 g/dL — ABNORMAL LOW (ref 13.0–17.0)
Immature Granulocytes: 0 %
Lymphocytes Relative: 18 %
Lymphs Abs: 1 10*3/uL (ref 0.7–4.0)
MCH: 21.5 pg — ABNORMAL LOW (ref 26.0–34.0)
MCHC: 29.4 g/dL — ABNORMAL LOW (ref 30.0–36.0)
MCV: 73.2 fL — ABNORMAL LOW (ref 80.0–100.0)
Monocytes Absolute: 0.4 10*3/uL (ref 0.1–1.0)
Monocytes Relative: 8 %
Neutro Abs: 3.6 10*3/uL (ref 1.7–7.7)
Neutrophils Relative %: 69 %
Platelets: 150 10*3/uL (ref 150–400)
RBC: 4.51 MIL/uL (ref 4.22–5.81)
RDW: 15.9 % — ABNORMAL HIGH (ref 11.5–15.5)
WBC: 5.2 10*3/uL (ref 4.0–10.5)
nRBC: 0 % (ref 0.0–0.2)

## 2018-02-13 LAB — BASIC METABOLIC PANEL
Anion gap: 7 (ref 5–15)
BUN: 9 mg/dL (ref 8–23)
CO2: 30 mmol/L (ref 22–32)
Calcium: 9.3 mg/dL (ref 8.9–10.3)
Chloride: 103 mmol/L (ref 98–111)
Creatinine, Ser: 1.13 mg/dL (ref 0.61–1.24)
GFR calc Af Amer: >60 mL/min (ref >60)
GFR calc non Af Amer: >60 mL/min (ref >60)
Glucose, Bld: 117 mg/dL — ABNORMAL HIGH (ref 70–99)
Potassium: 3.4 mmol/L — ABNORMAL LOW (ref 3.5–5.1)
Sodium: 140 mmol/L (ref 135–145)

## 2018-02-13 NOTE — ED Provider Notes (Signed)
[facility] EMERGENCY DEPARTMENT Provider Note   CSN: [id number] Arrival date & time: 02/13/18  0245     History   Chief Complaint Chief Complaint  Patient presents with  . ringing in ear    HPI Joel [name] is a 77 y.o. male.  HPI   Joel [name] is a 77 y.o. male, with a history of CKD, COPD, DVT, GERD, DM, HTN, and anemia, presenting to the ED with "hearing the heartbeat" in his left ear beginning this morning around 2 AM.  Lasted for approximately 2 minutes, resolved, and has not recurred. He does endorse headache that is chronic and recurrent for him, for which he is under the care of a neurologist, no change from previous headaches. When he awoke, he took his blood pressure and noted that it was 180 "over something." He denies use of aspirin-containing products. Denies changes in hearing, changes in vision, extremity weakness, numbness, chest pain, shortness of breath, fever, neck pain, neuro deficits, or any other complaints.   Past Medical History:  Diagnosis Date  . Atypical chest pain   . CAD (coronary artery disease)    a. Multiple prior evaluations then 05/2012 s/p BMS to OM2. b. Nuc 11/2012 wnl. c. Cath 2015: patent stent, moderate LAD/RCA dz, treated medically.  . Cancer ([place])    Hx: of prostate  . Cataract   . CKD (chronic kidney disease), stage II   . COPD (chronic obstructive pulmonary disease) ([place])   . Cough secondary to angiotensin converting enzyme inhibitor (ACE-I)   . Diabetes mellitus without complication ([place])   . Diverticulosis   . DVT (deep venous thrombosis) ([place])   . Family history of breast cancer in first degree relative   . GERD (gastroesophageal reflux disease)   . Headache(784.0)    Hx: of  . History of blood transfusion    "I've had 2; don't know what it was related to" (11/09/2012)  . HLD (hyperlipidemia)   . HTN (hypertension)   . Iron deficiency anemia   . Microcytic anemia   . Peripheral vascular  disease ([place])    a. L pop-tibial bypass 2011, followed by [facility].  . Personal history of prostate cancer   . Rotator cuff injury    "left arm; never repaired" (11/09/2012)    Patient Active Problem List   Diagnosis Date Noted  . Dizziness 01/20/2018  . Malaise 01/20/2018  . Decreased breath sounds 01/20/2018  . Needs flu shot 01/20/2018  . Chronic nonintractable headache 12/07/2017  . Back pain 11/01/2017  . Radicular pain of lower extremity 11/01/2017  . Prostate cancer ([place]) 11/01/2017  . Family history of breast cancer in first degree relative   . Personal history of prostate cancer   . Elliptocytosis on peripheral blood smear ([place]) 09/13/2016  . Prediabetes 03/22/2016  . PVD (peripheral vascular disease) with claudication ([place]) 11/15/2012  . Chest pain, localized 11/10/2012  . Hypokalemia 11/10/2012  . Bradycardia 11/09/2012  . Thrombocytopenia ([place]) 06/01/2012  . Microcytic anemia   . Atherosclerosis of native arteries of the extremities with intermittent claudication 06/16/2011  . Stable angina ([place]) 06/11/2011  . CAD (coronary artery disease)   . Chest pain, atypical 05/30/2011  . Iron deficiency anemia 10/03/2009  . DIVERTICULOSIS OF COLON 10/03/2009  . WEIGHT LOSS, ABNORMAL 10/03/2009  . CHEST PAIN UNSPECIFIED 09/13/2008  . Headache(784.0) 08/19/2008  . TIA 06/05/2008  . SHOULDER PAIN, LEFT 10/11/2007  . COUGH DUE TO ACE INHIBITORS 05/07/2007  . History of tobacco  use disorder 04/25/2007  . Hyperlipidemia 04/21/2007  . Essential hypertension 04/21/2007  . GERD 04/21/2007    Past Surgical History:  Procedure Laterality Date  . CARDIOVASCULAR STRESS TEST  Aug. 2014  . CATARACT EXTRACTION W/ INTRAOCULAR LENS IMPLANT Right ~ 2008  . CIRCUMCISION    . COLONOSCOPY    . CORONARY ANGIOPLASTY WITH STENT PLACEMENT  2014   "1" (11/09/2012)  . ILIAC ARTERY ANEURYSM REPAIR    . LEFT HEART CATH  05-31-12  . LEFT HEART CATHETERIZATION WITH CORONARY ANGIOGRAM N/A 06/01/2011     Procedure: LEFT HEART CATHETERIZATION WITH CORONARY ANGIOGRAM;  Surgeon: Hillary Bow, MD;  Location: [facility] CATH LAB;  Service: Cardiovascular;  Laterality: N/A;  . LEFT HEART CATHETERIZATION WITH CORONARY ANGIOGRAM N/A 05/31/2012   Procedure: LEFT HEART CATHETERIZATION WITH CORONARY ANGIOGRAM;  Surgeon: Peter M Martinique, MD;  Location: [facility] CATH LAB;  Service: Cardiovascular;  Laterality: N/A;  . LEFT HEART CATHETERIZATION WITH CORONARY ANGIOGRAM N/A 08/01/2013   Procedure: LEFT HEART CATHETERIZATION WITH CORONARY ANGIOGRAM;  Surgeon: Burnell Blanks, MD;  Location: [facility] CATH LAB;  Service: Cardiovascular;  Laterality: N/A;  . PERCUTANEOUS CORONARY STENT INTERVENTION (PCI-S)  05/31/2012   Procedure: PERCUTANEOUS CORONARY STENT INTERVENTION (PCI-S);  Surgeon: Peter M Martinique, MD;  Location: [facility] CATH LAB;  Service: Cardiovascular;;  . PR VEIN BYPASS GRAFT,AORTO-FEM-POP Left 10/01/2009   left below knee popliteal artery to posterior tibial artery  . SHOULDER ARTHROSCOPY WITH OPEN ROTATOR CUFF REPAIR AND DISTAL CLAVICLE ACROMINECTOMY Left 01/12/2013   Procedure: LEFT SHOULDER ARTHROSCOPY WITH DEBRIDEMENT OPEN DISTAL CLAVICLE RESECTION ,acromioplastyAND ROTATOR CUFF REPAIR;  Surgeon: Yvette Rack., MD;  Location: [place] [facility];  Service: Orthopedics;  Laterality: Left;        Home Medications    Prior to Admission medications   Medication Sig Start Date End Date Taking? Authorizing Provider  abiraterone Acetate (ZYTIGA) 250 MG tablet Take 1,000 mg by mouth daily before breakfast. Take on an empty stomach 1 hour before or 2 hours after a meal    Yes [provider]  albuterol (PROVENTIL HFA;VENTOLIN HFA) 108 (90 Base) MCG/ACT inhaler Inhale 2 puffs into the lungs every 4 (four) hours as needed for wheezing or shortness of breath. 01/28/18  Yes Agyei, Caprice Kluver, MD  amLODipine (NORVASC) 10 MG tablet TAKE 1 TABLET BY MOUTH ONCE DAILY Patient taking differently: Take 10 mg by mouth daily.  11/07/17  Yes  Henson, Vickie L, NP-C  aspirin EC 81 MG tablet Take 81 mg by mouth daily.    Yes [provider]  benzonatate (TESSALON) 100 MG capsule Take 1-2 capsules (100-200 mg total) by mouth 3 (three) times daily as needed for cough. 02/11/18  Yes Scot Jun, FNP  brimonidine (ALPHAGAN) 0.2 % ophthalmic solution Place 1 drop into the right eye 2 (two) times daily.  09/13/17  Yes [provider]  ferrous sulfate 325 (65 FE) MG tablet Take 325 mg by mouth daily with breakfast.    Yes [provider]  isosorbide mononitrate (IMDUR) 30 MG 24 hr tablet Take 30 mg by mouth at bedtime.    Yes [provider]  losartan-hydrochlorothiazide (HYZAAR) 100-25 MG tablet Take 1 tablet by mouth daily. 01/20/18  Yes Tysinger, Camelia Eng, PA-C  metFORMIN (GLUCOPHAGE) 500 MG tablet Take 500 mg by mouth 2 (two) times daily with a meal. Reported on 08/27/2015   Yes [provider]  nitroGLYCERIN (NITROSTAT) 0.4 MG SL tablet Place 1 tablet (0.4 mg total) under the  tongue every 5 (five) minutes as needed for chest pain. 12/08/15  Yes Imogene Burn, PA-C  omeprazole (PRILOSEC) 20 MG capsule Take 20 mg by mouth 2 (two) times daily before a meal.   Yes [provider]  ondansetron (ZOFRAN) 4 MG tablet Take 1 tablet (4 mg total) by mouth every 8 (eight) hours as needed for nausea or vomiting. 02/11/18  Yes Scot Jun, FNP  pravastatin (PRAVACHOL) 40 MG tablet Take 40 mg by mouth at bedtime.   Yes [provider]  predniSONE (DELTASONE) 5 MG tablet Take 5 mg by mouth daily before breakfast.    Yes [provider]  sertraline (ZOLOFT) 25 MG tablet TAKE 1 TABLET BY MOUTH DAILY FOR 7 DAYS. INCREASE TO 2 TABLETS DAILY Patient taking differently: Take 50 mg by mouth daily.  01/31/18  Yes Tomi Likens, Adam R, DO  tamsulosin (FLOMAX) 0.4 MG CAPS capsule Take 0.4 mg at bedtime by mouth.   Yes [provider]  traMADol (ULTRAM) 50 MG tablet Take by mouth every  8 (eight) hours as needed for severe pain.   Yes [provider]    Family History Family History  Problem Relation Age of Onset  . Breast cancer Mother        dx 19s  . Hyperlipidemia Mother   . Hypertension Mother   . COPD Father        deceased  . Hyperlipidemia Father   . Hypertension Father   . Hypertension Sister   . Cancer Brother 63       Unknown cancer, possibly stomach  . Hypertension Brother   . Cancer Brother 74       Unknown, possibly lung  . Hypertension Daughter   . Hypertension Son   . Colon cancer Neg Hx        pt is unclear about family hx    Social History Social History   Tobacco Use  . Smoking status: Former Smoker    Packs/day: 0.00    Types: Cigarettes    Last attempt to quit: 05/26/2011    Years since quitting: 6.7  . Smokeless tobacco: Never Used  Substance Use Topics  . Alcohol use: No  . Drug use: No     Allergies   Topiramate; Atenolol; Lisinopril; and Tramadol-acetaminophen   Review of Systems Review of Systems  Constitutional: Negative for chills, diaphoresis and fever.  HENT: Negative for hearing loss.        Hearing heartbeat in the left ear, resolved prior to arrival.  Eyes: Negative for visual disturbance.  Respiratory: Negative for shortness of breath.   Cardiovascular: Negative for chest pain.  Gastrointestinal: Negative for abdominal pain, diarrhea, nausea and vomiting.  Musculoskeletal: Negative for neck pain.  Neurological: Positive for headaches (not acute). Negative for dizziness, syncope, facial asymmetry, speech difficulty, weakness, light-headedness and numbness.  All other systems reviewed and are negative.    Physical Exam Updated Vital Signs BP (!) 150/78 (BP Location: Right Arm)   Pulse 86   Temp 98.3 F (36.8 C) (Oral)   Resp 18   SpO2 97%   Physical Exam  Constitutional: He appears well-developed and well-nourished. No distress.  HENT:  Head: Normocephalic and atraumatic.  Right Ear:  Hearing, tympanic membrane, external ear and ear canal normal.  Left Ear: Hearing, tympanic membrane, external ear and ear canal normal.  Patient states he can hear my fingers lightly rubbing together equally on both sides.  Eyes: Conjunctivae are normal.  Neck: Neck  supple.  Cardiovascular: Normal rate, regular rhythm, normal heart sounds and intact distal pulses.  Pulmonary/Chest: Effort normal and breath sounds normal. No respiratory distress.  Abdominal: Soft. There is no tenderness. There is no guarding.  Musculoskeletal: He exhibits no edema.  Lymphadenopathy:    He has no cervical adenopathy.  Neurological: He is alert.  Sensation grossly intact to light touch in the extremities. Strength 5/5 in all extremities. No gait disturbance. Coordination intact. Cranial nerves III-XII grossly intact. No facial droop.   Skin: Skin is warm and dry. He is not diaphoretic.  Psychiatric: He has a normal mood and affect. His behavior is normal.  Nursing note and vitals reviewed.    ED Treatments / Results  Labs (all labs ordered are listed, but only abnormal results are displayed) Labs Reviewed  BASIC METABOLIC PANEL - Abnormal; Notable for the following components:      Result Value   Potassium 3.4 (*)    Glucose, Bld 117 (*)    All other components within normal limits  CBC WITH DIFFERENTIAL/PLATELET - Abnormal; Notable for the following components:   Hemoglobin 9.7 (*)    HCT 33.0 (*)    MCV 73.2 (*)    MCH 21.5 (*)    MCHC 29.4 (*)    RDW 15.9 (*)    All other components within normal limits  SALICYLATE LEVEL   Hemoglobin  Date Value Ref Range Status  02/13/2018 9.7 (L) 13.0 - 17.0 g/dL Final  01/28/2018 9.3 (L) 13.0 - 17.0 g/dL Final  08/24/2017 11.3 (L) 13.0 - 17.7 g/dL Final  07/14/2017 10.7 (L) 13.0 - 17.0 g/dL Final  05/30/2017 10.6 (L) 13.0 - 17.0 g/dL Final   HGB  Date Value Ref Range Status  11/12/2016 11.3 (L) 13.0 - 17.1 g/dL Final  09/30/2016 10.8 (L) 13.0 -  17.1 g/dL Final    EKG Performed Nov 4 at 0513 Vent. rate 75 BPM PR interval 246 ms QRS duration 92 ms QT/QTc 382/426 ms P-R-T axes 71 45 53  Sinus rhythm with 1st degree A-V block Nonspecific T wave abnormality Abnormal ECG No significant change since last tracing Confirmed by Pryor Curia [phone]) on 02/13/2018 5:12:59 AM  Radiology No results found.  Procedures Procedures (including critical care time)  Medications Ordered in ED Medications - No data to display   Initial Impression / Assessment and Plan / ED Course  I have reviewed the triage vital signs and the nursing notes.  Pertinent labs & imaging results that were available during my care of the patient were reviewed by me and considered in my medical decision making (see chart for details).     Patient presents with an episode of tinnitus that resolved and did not recur.  He also noted an intermittent headache that is not unusual for him.  No focal neuro deficits.  PCP versus ENT follow-up.  Return precautions discussed.  Patient voices understanding of these instructions, accepts the plan, and is comfortable with discharge.  Findings and plan of care discussed with Delora Fuel, MD. Dr. Roxanne Mins personally evaluated and examined this patient.  Vitals:   02/13/18 0645 02/13/18 0700 02/13/18 0800 02/13/18 0845  BP: 117/67 126/72 137/68 (!) 141/74  Pulse: 63 60 (!) 52 62  Resp: 12 14 16 20   Temp:      TempSrc:      SpO2: 96% 99% 94% 96%     Final Clinical Impressions(s) / ED Diagnoses   Final diagnoses:  Tinnitus of left ear  ED Discharge Orders    None       Layla Maw 15/72/62 0355    Delora Fuel, MD 97/41/63 [phone]

## 2018-02-13 NOTE — Discharge Instructions (Signed)
Please follow-up with your primary care provider and the ear nose and throat specialist.

## 2018-02-13 NOTE — ED Triage Notes (Signed)
Woke up this morning hearing heartbeat in L ear.  BP at home 180/-.  Then he started having ringing in bilateral ears.  Also reports "slight headache".  No neuro deficits noted on triage exam.  States he takes BP medication as instructed but missed dose on Saturday due to vomiting.  Seen at [facility] on Saturday.

## 2018-02-15 ENCOUNTER — Other Ambulatory Visit: Payer: Self-pay | Admitting: Family Medicine

## 2018-02-15 MED ORDER — TRAMADOL HCL 50 MG PO TABS: 50.0000 mg | ORAL_TABLET | Freq: Three times a day (TID) | ORAL | 0 refills | Status: DC | PRN

## 2018-02-15 NOTE — Telephone Encounter (Signed)
Ok to refill. He takes these sparingly typically.

## 2018-02-15 NOTE — Telephone Encounter (Signed)
Pt was notified. Please send this in

## 2018-02-15 NOTE — Telephone Encounter (Signed)
  Patient called wants refill on his tramadol  I could not locate where we had filled it but he has a bottle that shows it was filled 11/01/17 by Audelia Acton  Patient would like return call   [facility]

## 2018-02-17 DIAGNOSIS — H93A9 Pulsatile tinnitus, unspecified ear: Secondary | ICD-10-CM | POA: Insufficient documentation

## 2018-02-20 ENCOUNTER — Ambulatory Visit: Payer: PPO | Admitting: Cardiovascular Disease

## 2018-02-27 ENCOUNTER — Ambulatory Visit (INDEPENDENT_AMBULATORY_CARE_PROVIDER_SITE_OTHER): Payer: PPO | Admitting: Family Medicine

## 2018-02-27 ENCOUNTER — Encounter: Payer: Self-pay | Admitting: Family Medicine

## 2018-02-27 VITALS — BP 122/64 | HR 68 | Temp 97.8°F | Resp 16 | Wt 241.6 lb

## 2018-02-27 DIAGNOSIS — R51 Headache: Secondary | ICD-10-CM | POA: Diagnosis not present

## 2018-02-27 DIAGNOSIS — R05 Cough: Secondary | ICD-10-CM | POA: Diagnosis not present

## 2018-02-27 DIAGNOSIS — I10 Essential (primary) hypertension: Secondary | ICD-10-CM

## 2018-02-27 DIAGNOSIS — T7840XA Allergy, unspecified, initial encounter: Secondary | ICD-10-CM

## 2018-02-27 DIAGNOSIS — R059 Cough, unspecified: Secondary | ICD-10-CM

## 2018-02-27 DIAGNOSIS — R519 Headache, unspecified: Secondary | ICD-10-CM

## 2018-02-27 DIAGNOSIS — G8929 Other chronic pain: Secondary | ICD-10-CM

## 2018-02-27 MED ORDER — LORATADINE 10 MG PO TABS: 10.0000 mg | ORAL_TABLET | Freq: Every day | ORAL | 2 refills | Status: DC

## 2018-02-27 NOTE — Progress Notes (Signed)
   Subjective:    [name], male    DOB: 09-09-1940, 77 y.o.   MRN: [id number]  HPI Chief Complaint  Patient presents with  . follow-up    follow-up not sure why hes here but he is having a coughing spell.    He is here to follow up on chronic health conditions including chronic headaches and constipation.   Complains of a cough for the past month. Cough is dry mostly and he has 1-2 episodes per day. Cough is not keeping him awake at night. He also reports associated sneezing and rhinorrhea. No ear pain, sinus pressure, sore throat, shortness of breath, wheezing.   Denies fever, chills, dizziness, chest pain, palpitations, abdominal pain, N/V/D.   Apparently he had constipation and a positive guaiac in August and a negative KUB. He no longer is having issues with this. Is not taking medication for constipation. No blood in his stool.   He saw Dr. Tomi Likens for chronic headaches.  States he is taking Gabapentin 600 mg at bedtime and no longer having headaches. This was prescribed by the [facility] for him. States Dr. Domingo Cocking in the past has prescribed this for him.    BP is in goal range. Stable on medications and no concerns.   Reviewed allergies, medications, past medical, surgical, family, and social history.   Review of Systems Pertinent positives and negatives in the history of present illness.     Objective:   Physical Exam BP 122/64   Pulse 68   Temp 97.8 F (36.6 C) (Oral)   Resp 16   Wt 241 lb 9.6 oz (109.6 kg)   SpO2 98%   BMI 34.67 kg/m   Alert and in no distress. No sinus tenderness. Nares with clear drainage, erythema, mild edema. Tympanic membranes and canals are normal. Pharyngeal area is normal. Neck is supple without adenopathy or thyromegaly. Cardiac exam shows a regular sinus rhythm without murmurs or gallops. Lungs are clear to auscultation. Extremities without edema. Skin is warm and dry.       Assessment & Plan:  Cough  Allergic state,  initial encounter - Plan: loratadine (CLARITIN) 10 MG tablet  Essential hypertension  Chronic nonintractable headache, unspecified headache type  Discussed that his symptoms appear to be related to allergies. He will try Claritin and let me know if this is not helping.  No longer having issues with constipation and no blood in stool.  He started taking gabapentin for headaches, this was prescribed by the [place] and Dr. Domingo Cocking in the past for headaches. He will call and let Dr. Tomi Likens know he is taking this. No longer having headaches since starting back on gabapentin.  BP is in goal range. No changes to medications.  Follow up in April for Hgb A1c or sooner if needed.

## 2018-02-27 NOTE — Patient Instructions (Addendum)
Call Dr. Georgie Chard office and let him know that you started taking Gabapentin for your headaches and it is helping.   Dr. Georgie Chard number is [phone]  Try the Claritin for your runny nose and cough. Let's see if this helps.

## 2018-04-05 ENCOUNTER — Other Ambulatory Visit: Payer: Self-pay | Admitting: Neurology

## 2018-04-26 ENCOUNTER — Telehealth: Payer: Self-pay | Admitting: Cardiovascular Disease

## 2018-04-26 ENCOUNTER — Encounter: Payer: Self-pay | Admitting: Cardiology

## 2018-04-26 NOTE — Telephone Encounter (Signed)
Called to check on patient.  He states he called to schedule an appointment but thinks he needs to be seen prior to recall date because he's having some chest discomfort. He would not elaborate, he states "it's nothing too bad, I just think it needs to be checked out."  Scheduled patient for visit tomorrow morning with B. Simmons. He understands to seek emergency medical assistance if symptoms worsen prior to appointment.

## 2018-04-26 NOTE — Telephone Encounter (Signed)
° ° °  Patient called to schedule appt (received recall letter), states he does not want to wait until May for 36mo f/u because he is having some discomfort in chest. Scheduler attempted to ask smart phrase questions. Patient declined to answer and told scheduler "dont make a big deal about this issue, just give him an appointment soon"

## 2018-04-27 ENCOUNTER — Encounter (INDEPENDENT_AMBULATORY_CARE_PROVIDER_SITE_OTHER): Payer: Self-pay

## 2018-04-27 ENCOUNTER — Ambulatory Visit: Payer: Medicare HMO | Admitting: Cardiology

## 2018-04-27 ENCOUNTER — Encounter: Payer: Self-pay | Admitting: Cardiology

## 2018-04-27 VITALS — BP 118/66 | HR 72 | Ht 70.0 in | Wt 251.1 lb

## 2018-04-27 DIAGNOSIS — I208 Other forms of angina pectoris: Secondary | ICD-10-CM

## 2018-04-27 DIAGNOSIS — I25119 Atherosclerotic heart disease of native coronary artery with unspecified angina pectoris: Secondary | ICD-10-CM | POA: Diagnosis not present

## 2018-04-27 DIAGNOSIS — E1151 Type 2 diabetes mellitus with diabetic peripheral angiopathy without gangrene: Secondary | ICD-10-CM | POA: Diagnosis not present

## 2018-04-27 DIAGNOSIS — I739 Peripheral vascular disease, unspecified: Secondary | ICD-10-CM | POA: Diagnosis not present

## 2018-04-27 MED ORDER — ATORVASTATIN CALCIUM 40 MG PO TABS: 40.0000 mg | ORAL_TABLET | Freq: Every day | ORAL | 3 refills | Status: DC

## 2018-04-27 MED ORDER — NITROGLYCERIN 0.4 MG SL SUBL: 0.4000 mg | SUBLINGUAL_TABLET | SUBLINGUAL | 2 refills | Status: DC | PRN

## 2018-04-27 NOTE — Patient Instructions (Signed)
Medication Instructions:  1.) STOP: Pravastatin   2.) START: Atorvastatin (Lipitor) 40 mg daily   If you need a refill on your cardiac medications before your next appointment, please call your pharmacy.   Lab work: None  If you have labs (blood work) drawn today and your tests are completely normal, you will receive your results only by: Marland Kitchen MyChart Message (if you have MyChart) OR . A paper copy in the mail If you have any lab test that is abnormal or we need to change your treatment, we will call you to review the results.  Testing/Procedures: None  Follow-Up: At [facility], you and your health needs are our priority.  As part of our continuing mission to provide you with exceptional heart care, we have created designated Provider Care Teams.  These Care Teams include your primary Cardiologist (physician) and Advanced Practice Providers (APPs -  Physician Assistants and Nurse Practitioners) who all work together to provide you with the care you need, when you need it. You will need a follow up appointment in 6 months.  Please call our office 2 months in advance to schedule this appointment.  You may see Lauree Chandler, MD or one of the following Advanced Practice Providers on your designated Care Team:   [place], PA-C Melina Copa, PA-C . Ermalinda Barrios, PA-C   Any Other Special Instructions Will Be Listed Below (If Applicable).

## 2018-04-27 NOTE — Progress Notes (Signed)
04/27/2018 Joel [name]   [birth year]  [id number]  Primary Physician Girtha Rm, NP-C Primary Cardiologist: Dr. Angelena Form  Electrophysiologist: none   Reason for Visit/CC: Chest Pain   HPI:  Joel [name] is a 78 y.o. male who is being seen today for the evaluation of chest pain. He has known CAD and has undergo PCI in the past but also a long history of atypical CP and frequent ED visits for CP. To summarize, Cardiac cath February 2014 showed  severe OM2 stenosis treated with bare metal stent. Admitted to [facility] 11/09/12 with chest pain. Ruled out for MI. Lexiscan stress myoview 11/16/12 without ischemia. Normal LVEF.  Dr. Angelena Form saw him in the office 07/27/13 and he c/o exertional chest pain c/w unstable angina. Cardiac cath 08/01/13 with patent stent Circumflex and mild to moderate disease in the LAD and RCA. He was seen here 03/04/14 and had c/o chest pain after working on his car but quickly resolved. He was seen in the ED 09/10/14 with atypical chest pain. Troponin was negative x 2 and EKG was unchanged. Multiple ED visits with chest pain in 2017. Nuclear stress test November 2018 with EF 55% with moderate inferior defect that partially improves consistent with ischemia and possible soft tissue attenuation. Small region of anterior anterior septal ischemia.  He was seen again in the ED 07/15/17 and he had atypical chest pain with negative troponin and no EKG changes.  Most recently seen in the ED 01/28/18 with atypical chest pain, negative troponin.    He also has a h/o PVD, HTN, HLD and DM. His PAD is followed in [facility] by Dr. Scot Dock. Also diagnosed with prostate cancer in 2019.   Pt was recently called to arrange 6 month f/u but pt requested to be seen sooner given recurrent CP. His CP is very atypical. He reports occasional left sided chest pain, dull ache, occurring at rest and resolves spontaneously w/o intervention/ nitro. Pain last 3-4 min. Occurs infrequently. Pt states "not often".  Maybe 1-2 times a month. He denies any exertional symptoms. No CP or dyspnea w/ activity. This pain is also different from his angina he had in 2014, which was severe substernal pressure associated w/ dyspnea.   EKG today shows NSR w/ 1st degree AV block and nonspecific ST and T wave abnormalties. BP is 118/66. His last lipid panel in July showed elevated LDL at 100 mg/dL. He is on Pravastatin.    Cardiac Studies  Cardiac cath 08/01/13: Left main: No obstructive disease.  Left Anterior Descending Artery: Large caliber vessel that courses to the apex. The proximal and mid vessel is ectatic. There is diffuse 30-40% stenosis in the mid vessel. There are mild luminal irregularities in the distal vessel.  Circumflex Artery: Large caliber vessel with two obtuse marginal branches. The first OM branch is small in caliber and is chronically occluded in the mid vessel. The second OM branch is patent with patent stent, minimal restenosis. The distal segment of the OM branch has diffuse plaque.  Right Coronary Artery: Large caliber dominant vessel with proximal Shepherd's crook. The proximal vessel has diffuse 40% stenosis. The mid vessel is ectatic with 40% stenosis. The distal vessel has diffuse 50% stenosis. The PDA has 50% proximal stenosis followed by diffuse plaque.  Left Ventricular Angiogram: LVEF=65%.  Stress Test 03/10/17 Study Highlights    Nuclear stress EF: 55%.  Moderate sided inferior defect that partially improves consistent with ischemia and possible soft tissue attenuation. Small region of anterior/anteroseptal  ischemia. (base)  This is an intermediate risk study.      No outpatient medications have been marked as taking for the 04/27/18 encounter (Office Visit) with Consuelo Pandy, PA-C.   Current Facility-Administered Medications for the 04/27/18 encounter (Office Visit) with Consuelo Pandy, PA-C  Medication  . 0.9 %  sodium chloride infusion   Allergies  Allergen  Reactions  . Topiramate Shortness Of Breath and Other (See Comments)     leg cramps  . Atenolol Other (See Comments)    bradycardia  . Lisinopril Cough  . Tramadol-Acetaminophen Cough    Tolerates plain Tramadol   Past Medical History:  Diagnosis Date  . Atypical chest pain   . CAD (coronary artery disease)    a. Multiple prior evaluations then 05/2012 s/p BMS to OM2. b. Nuc 11/2012 wnl. c. Cath 2015: patent stent, moderate LAD/RCA dz, treated medically.  . Cancer ([place])    Hx: of prostate  . Cataract   . CKD (chronic kidney disease), stage II   . COPD (chronic obstructive pulmonary disease) ([place])   . Cough secondary to angiotensin converting enzyme inhibitor (ACE-I)   . Diabetes mellitus without complication ([place])   . Diverticulosis   . DVT (deep venous thrombosis) ([place])   . Family history of breast cancer in first degree relative   . GERD (gastroesophageal reflux disease)   . Headache(784.0)    Hx: of  . History of blood transfusion    "I've had 2; don't know what it was related to" (11/09/2012)  . HLD (hyperlipidemia)   . HTN (hypertension)   . Iron deficiency anemia   . Microcytic anemia   . Peripheral vascular disease ([place])    a. L pop-tibial bypass 2011, followed by [facility].  . Personal history of prostate cancer   . Rotator cuff injury    "left arm; never repaired" (11/09/2012)   Family History  Problem Relation Age of Onset  . Breast cancer Mother        dx 49s  . Hyperlipidemia Mother   . Hypertension Mother   . COPD Father        deceased  . Hyperlipidemia Father   . Hypertension Father   . Hypertension Sister   . Cancer Brother 2       Unknown cancer, possibly stomach  . Hypertension Brother   . Cancer Brother 56       Unknown, possibly lung  . Hypertension Daughter   . Hypertension Son   . Colon cancer Neg Hx        pt is unclear about family hx   Past Surgical History:  Procedure Laterality Date  . CARDIOVASCULAR STRESS TEST  Aug. 2014  . CATARACT  EXTRACTION W/ INTRAOCULAR LENS IMPLANT Right ~ 2008  . CIRCUMCISION    . COLONOSCOPY    . CORONARY ANGIOPLASTY WITH STENT PLACEMENT  2014   "1" (11/09/2012)  . ILIAC ARTERY ANEURYSM REPAIR    . LEFT HEART CATH  05-31-12  . LEFT HEART CATHETERIZATION WITH CORONARY ANGIOGRAM N/A 06/01/2011   Procedure: LEFT HEART CATHETERIZATION WITH CORONARY ANGIOGRAM;  Surgeon: Hillary Bow, MD;  Location: [facility] CATH LAB;  Service: Cardiovascular;  Laterality: N/A;  . LEFT HEART CATHETERIZATION WITH CORONARY ANGIOGRAM N/A 05/31/2012   Procedure: LEFT HEART CATHETERIZATION WITH CORONARY ANGIOGRAM;  Surgeon: Peter M Martinique, MD;  Location[facility] CATH LAB;  Service: Cardiovascular;  Laterality: N/A;  . LEFT HEART CATHETERIZATION WITH CORONARY ANGIOGRAM N/A 08/01/2013   Procedure: LEFT HEART  CATHETERIZATION WITH CORONARY ANGIOGRAM;  Surgeon: Burnell Blanks, MD;  Location[facility] CATH LAB;  Service: Cardiovascular;  Laterality: N/A;  . PERCUTANEOUS CORONARY STENT INTERVENTION (PCI-S)  05/31/2012   Procedure: PERCUTANEOUS CORONARY STENT INTERVENTION (PCI-S);  Surgeon: Peter M Martinique, MD;  Location: [facility] CATH LAB;  Service: Cardiovascular;;  . PR VEIN BYPASS GRAFT,AORTO-FEM-POP Left 10/01/2009   left below knee popliteal artery to posterior tibial artery  . SHOULDER ARTHROSCOPY WITH OPEN ROTATOR CUFF REPAIR AND DISTAL CLAVICLE ACROMINECTOMY Left 01/12/2013   Procedure: LEFT SHOULDER ARTHROSCOPY WITH DEBRIDEMENT OPEN DISTAL CLAVICLE RESECTION ,acromioplastyAND ROTATOR CUFF REPAIR;  Surgeon: Yvette Rack., MD;  Location: [place];  Service: Orthopedics;  Laterality: Left;   Social History   Socioeconomic History  . Marital status: Widowed    Spouse name: Not on file  . Number of children: Not on file  . Years of education: Not on file  . Highest education level: 12th grade  Occupational History  . Occupation: Retired  Scientific laboratory technician  . Financial resource strain: Not on file  . Food insecurity:    Worry: Not on file     Inability: Not on file  . Transportation needs:    Medical: Not on file    Non-medical: Not on file  Tobacco Use  . Smoking status: Former Smoker    Packs/day: 0.00    Types: Cigarettes    Last attempt to quit: 05/26/2011    Years since quitting: 6.9  . Smokeless tobacco: Never Used  Substance and Sexual Activity  . Alcohol use: No  . Drug use: No  . Sexual activity: Not on file  Lifestyle  . Physical activity:    Days per week: Not on file    Minutes per session: Not on file  . Stress: Not on file  Relationships  . Social connections:    Talks on phone: Not on file    Gets together: Not on file    Attends religious service: Not on file    Active member of club or organization: Not on file    Attends meetings of clubs or organizations: Not on file    Relationship status: Not on file  . Intimate partner violence:    Fear of current or ex partner: Not on file    Emotionally abused: Not on file    Physically abused: Not on file    Forced sexual activity: Not on file  Other Topics Concern  . 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  Lives in [place] by himself.      Patient writes with his right hand, though uses his left hand for every thing else. He lives alone in a one level home. He drinks 3 cups of coffee a day and 3 7-8 oz sodas a day. He does not exercise.     Review of Systems: General: negative for chills, fever, night sweats or weight changes.  Cardiovascular: negative for chest pain, dyspnea on exertion, edema, orthopnea, palpitations, paroxysmal nocturnal dyspnea or shortness of breath Dermatological: negative for rash Respiratory: negative for cough or wheezing Urologic: negative for hematuria Abdominal: negative for nausea, vomiting, diarrhea, bright red blood per rectum, melena, or hematemesis Neurologic: negative for visual changes, syncope, or dizziness All other systems reviewed and are otherwise negative except  as noted above.   Physical Exam:  Height 5\' 10"  (1.778 m), weight 251 lb 1.9 oz (113.9 kg).  General appearance: alert, cooperative and no  distress Neck: no carotid bruit and no JVD Lungs: clear to auscultation bilaterally Heart: regular rate and rhythm, S1, S2 normal, no murmur, click, rub or gallop Extremities: extremities normal, atraumatic, no cyanosis or edema Pulses: 2+ and symmetric Skin: Skin color, texture, turgor normal. No rashes or lesions Neurologic: Grossly normal  EKG EKG today shows NSR w/ 1st degree AV block and nonspecific ST and T wave abnormalties. BP is 118/66. -- personally reviewed   ASSESSMENT AND PLAN:   1. CAD:  Cardiac cath February 2014 showed  severe OM2 stenosis treated with bare metal stent. Last cardiac cath 08/01/13 with patent stent in the Circumflex and mild to moderate disease in the LAD and RCA. Nuclear stress test in November 2018 with no large areas of ischemia. No recent CP that is c/w w/ ischemia. EKG NSR and unchanged from previous. We will continue current medical regimen, but will change statin as outlined below. Continue ASA, statin, nitrate, CCB, ARB.  2. HLD: Cholesterol is not at recommended goal w/ Pravastatin. LDL 100. Given CAD, we will stop Pravastatin and start Lipitor 40 mg. LDL goal < 70 mg/dL. Repeat FLP and HFTs in 8 weeks.   3. HTN: controlled on current regimen. Recent BMP in Nov 2019 showed stable renal function and K on Losartan/HCTZ.   4. DM: on Metformin. Management per PCP.   5. Prostate Cancer: followed at the [facility] clinic and being managed medically.   6. PVD: followed by vascular.   Follow-Up w/ Dr. Angelena Form in 6 months.   Eleyna Brugh Ladoris Gene, MHS [facility] 04/27/2018 10:03 AM

## 2018-05-05 ENCOUNTER — Other Ambulatory Visit: Payer: Self-pay | Admitting: Family Medicine

## 2018-05-05 ENCOUNTER — Other Ambulatory Visit: Payer: Self-pay | Admitting: Cardiovascular Disease

## 2018-05-08 NOTE — Telephone Encounter (Signed)
Ok to refill. Thanks, chris

## 2018-05-15 DIAGNOSIS — H401230 Low-tension glaucoma, bilateral, stage unspecified: Secondary | ICD-10-CM | POA: Diagnosis not present

## 2018-05-22 ENCOUNTER — Other Ambulatory Visit: Payer: Self-pay | Admitting: Family Medicine

## 2018-05-22 DIAGNOSIS — T7840XA Allergy, unspecified, initial encounter: Secondary | ICD-10-CM

## 2018-05-24 ENCOUNTER — Ambulatory Visit: Payer: PPO | Admitting: Physician Assistant

## 2018-06-06 ENCOUNTER — Ambulatory Visit: Payer: PPO | Admitting: Neurology

## 2018-07-04 ENCOUNTER — Other Ambulatory Visit: Payer: Self-pay

## 2018-07-04 ENCOUNTER — Encounter: Payer: Self-pay | Admitting: Family Medicine

## 2018-07-04 ENCOUNTER — Ambulatory Visit (INDEPENDENT_AMBULATORY_CARE_PROVIDER_SITE_OTHER): Payer: Medicare HMO | Admitting: Family Medicine

## 2018-07-04 VITALS — BP 130/80 | HR 62 | Resp 16 | Wt 254.0 lb

## 2018-07-04 DIAGNOSIS — J302 Other seasonal allergic rhinitis: Secondary | ICD-10-CM | POA: Diagnosis not present

## 2018-07-04 DIAGNOSIS — J449 Chronic obstructive pulmonary disease, unspecified: Secondary | ICD-10-CM | POA: Diagnosis not present

## 2018-07-04 DIAGNOSIS — Z79899 Other long term (current) drug therapy: Secondary | ICD-10-CM

## 2018-07-04 MED ORDER — ALBUTEROL SULFATE HFA 108 (90 BASE) MCG/ACT IN AERS: 2.0000 | INHALATION_SPRAY | Freq: Four times a day (QID) | RESPIRATORY_TRACT | 1 refills | Status: DC | PRN

## 2018-07-04 NOTE — Progress Notes (Signed)
   Subjective:    [name], male    DOB: 1940/11/01, 78 y.o.   MRN: [id number]  HPI Chief Complaint  Patient presents with  . discuss inhaler    discuss inhaler. previous insurance needed proair HFA  but  insurance company changed and now insurance needs to change to albuterol sulfate HFA 90 mcg.    He is in his usual state of health and here requesting that I send in a new prescription for albuterol inhaler.  States his insurance sent him a note that pro-air is not covered and that he should be taking albuterol.  States he was diagnosed in the past with mild COPD but is unclear who diagnosed him with this.  States he uses the albuterol inhaler this time of year due to seasonal allergies.  States he typically uses it once or twice a week if he is feeling congested or tight.  He is not currently taking allergy medication.  States he does not want to take this.  Denies fever, chills, night sweats, fatigue, dizziness, URI symptoms, chest pain, palpitations, shortness of breath, cough, abdominal pain, nausea, vomiting, diarrhea.   Review of Systems Pertinent positives and negatives in the history of present illness.     Objective:   Physical Exam BP 130/80   Pulse 62   Wt 254 lb (115.2 kg)   BMI 36.45 kg/m   Alert and oriented and in no acute distress.  Respirations unlabored and normal.  Skin is warm and dry.  Speech, mood and thought process are all normal.  Normal gait.      Assessment & Plan:  Medication management - Plan: albuterol (PROVENTIL HFA;VENTOLIN HFA) 108 (90 Base) MCG/ACT inhaler  Chronic obstructive pulmonary disease, unspecified COPD type ([place])  Seasonal allergies  He is a 78 year old male who is in his usual state of health and here today requesting a new prescription for the brand albuterol inhaler as opposed to pro-air.  Discussed that I will send in a new prescription for him.  He is not needing this very often.  He apparently has seasonal  allergies but is not interested in starting on allergy medication.  Currently asymptomatic and just wanted to make sure the albuterol is on hand if he needs it. Unclear as to who diagnosed him with COPD in the past.  He will follow-up if he is needing this more than twice per week.

## 2018-07-21 ENCOUNTER — Other Ambulatory Visit: Payer: Self-pay | Admitting: Medical

## 2018-07-24 ENCOUNTER — Other Ambulatory Visit: Payer: Self-pay | Admitting: Medical

## 2018-07-24 ENCOUNTER — Other Ambulatory Visit: Payer: Self-pay

## 2018-07-24 ENCOUNTER — Emergency Department (HOSPITAL_COMMUNITY): Payer: Medicare HMO

## 2018-07-24 ENCOUNTER — Emergency Department (HOSPITAL_COMMUNITY)
Admission: EM | Admit: 2018-07-24 | Discharge: 2018-07-24 | Disposition: A | Discharge status: Home / Self Care | Payer: Medicare HMO | Attending: Emergency Medicine | Admitting: Emergency Medicine

## 2018-07-24 DIAGNOSIS — J449 Chronic obstructive pulmonary disease, unspecified: Secondary | ICD-10-CM | POA: Diagnosis not present

## 2018-07-24 DIAGNOSIS — M25512 Pain in left shoulder: Secondary | ICD-10-CM | POA: Diagnosis not present

## 2018-07-24 DIAGNOSIS — Z87891 Personal history of nicotine dependence: Secondary | ICD-10-CM | POA: Insufficient documentation

## 2018-07-24 DIAGNOSIS — Z7982 Long term (current) use of aspirin: Secondary | ICD-10-CM | POA: Insufficient documentation

## 2018-07-24 DIAGNOSIS — Z79899 Other long term (current) drug therapy: Secondary | ICD-10-CM | POA: Insufficient documentation

## 2018-07-24 DIAGNOSIS — N182 Chronic kidney disease, stage 2 (mild): Secondary | ICD-10-CM | POA: Insufficient documentation

## 2018-07-24 DIAGNOSIS — R079 Chest pain, unspecified: Secondary | ICD-10-CM | POA: Diagnosis not present

## 2018-07-24 DIAGNOSIS — I129 Hypertensive chronic kidney disease with stage 1 through stage 4 chronic kidney disease, or unspecified chronic kidney disease: Secondary | ICD-10-CM | POA: Diagnosis not present

## 2018-07-24 LAB — COMPREHENSIVE METABOLIC PANEL
ALT: 17 U/L (ref 0–44)
AST: 19 U/L (ref 15–41)
Albumin: 3.9 g/dL (ref 3.5–5.0)
Alkaline Phosphatase: 81 U/L (ref 38–126)
Anion gap: 13 (ref 5–15)
BUN: 16 mg/dL (ref 8–23)
CO2: 25 mmol/L (ref 22–32)
Calcium: 9.4 mg/dL (ref 8.9–10.3)
Chloride: 103 mmol/L (ref 98–111)
Creatinine, Ser: 1.34 mg/dL — ABNORMAL HIGH (ref 0.61–1.24)
GFR calc Af Amer: 58 mL/min — ABNORMAL LOW (ref >60)
GFR calc non Af Amer: 50 mL/min — ABNORMAL LOW (ref >60)
Glucose, Bld: 123 mg/dL — ABNORMAL HIGH (ref 70–99)
Potassium: 3.6 mmol/L (ref 3.5–5.1)
Sodium: 141 mmol/L (ref 135–145)
Total Bilirubin: 0.4 mg/dL (ref 0.3–1.2)
Total Protein: 6.4 g/dL — ABNORMAL LOW (ref 6.5–8.1)

## 2018-07-24 LAB — CBC WITH DIFFERENTIAL/PLATELET
Abs Immature Granulocytes: 0.04 10*3/uL (ref 0.00–0.07)
Basophils Absolute: 0 10*3/uL (ref 0.0–0.1)
Basophils Relative: 1 %
Eosinophils Absolute: 0.4 10*3/uL (ref 0.0–0.5)
Eosinophils Relative: 6 %
HCT: 33.1 % — ABNORMAL LOW (ref 39.0–52.0)
Hemoglobin: 9.6 g/dL — ABNORMAL LOW (ref 13.0–17.0)
Immature Granulocytes: 1 %
Lymphocytes Relative: 20 %
Lymphs Abs: 1.4 10*3/uL (ref 0.7–4.0)
MCH: 20.8 pg — ABNORMAL LOW (ref 26.0–34.0)
MCHC: 29 g/dL — ABNORMAL LOW (ref 30.0–36.0)
MCV: 71.6 fL — ABNORMAL LOW (ref 80.0–100.0)
Monocytes Absolute: 0.5 10*3/uL (ref 0.1–1.0)
Monocytes Relative: 7 %
Neutro Abs: 4.5 10*3/uL (ref 1.7–7.7)
Neutrophils Relative %: 65 %
Platelets: 171 10*3/uL (ref 150–400)
RBC: 4.62 MIL/uL (ref 4.22–5.81)
RDW: 16.9 % — ABNORMAL HIGH (ref 11.5–15.5)
WBC: 6.8 10*3/uL (ref 4.0–10.5)
nRBC: 0 % (ref 0.0–0.2)

## 2018-07-24 LAB — TROPONIN I
Troponin I: <0.03 ng/mL (ref <0.03)
Troponin I: <0.03 ng/mL (ref <0.03)

## 2018-07-24 LAB — LIPASE, BLOOD: Lipase: 43 U/L (ref 11–51)

## 2018-07-24 MED ORDER — ASPIRIN 81 MG PO CHEW
324.0000 mg | CHEWABLE_TABLET | Freq: Once | ORAL | Status: AC
  Administered 2018-07-24: 324 mg via ORAL
  Filled 2018-07-24: qty 4

## 2018-07-24 MED ORDER — NITROGLYCERIN 0.4 MG SL SUBL: 0.4000 mg | SUBLINGUAL_TABLET | SUBLINGUAL | Status: DC | PRN

## 2018-07-24 NOTE — ED Provider Notes (Signed)
[facility] EMERGENCY DEPARTMENT Provider Note   CSN: [id number] Arrival date & time: 07/24/18  0035    History   Chief Complaint Chief Complaint  Patient presents with  . Shoulder Pain  . Chest Pain    HPI Joel [name] is a 78 y.o. male.     Patient presents to the emergency department for evaluation of chest pain.  Patient reports that symptoms began at 10:30 PM.  He was sitting and resting when pain began.  Patient reports pain in the left side of his chest, goes under the breast and then radiates up into the left shoulder area.  He denies nausea, vomiting, diaphoresis, shortness of breath associated with the symptoms.  He did take nitroglycerin at home with some improvement of the pain but not complete resolution.     Past Medical History:  Diagnosis Date  . Atypical chest pain   . CAD (coronary artery disease)    a. Multiple prior evaluations then 05/2012 s/p BMS to OM2. b. Nuc 11/2012 wnl. c. Cath 2015: patent stent, moderate LAD/RCA dz, treated medically.  . Cancer ([place])    Hx: of prostate  . Cataract   . CKD (chronic kidney disease), stage II   . COPD (chronic obstructive pulmonary disease) ([place])   . Cough secondary to angiotensin converting enzyme inhibitor (ACE-I)   . Diabetes mellitus without complication ([place])   . Diverticulosis   . DVT (deep venous thrombosis) ([place])   . Family history of breast cancer in first degree relative   . GERD (gastroesophageal reflux disease)   . Headache(784.0)    Hx: of  . History of blood transfusion    "I've had 2; don't know what it was related to" (11/09/2012)  . HLD (hyperlipidemia)   . HTN (hypertension)   . Iron deficiency anemia   . Microcytic anemia   . Peripheral vascular disease ([place])    a. L pop-tibial bypass 2011, followed by [facility].  . Personal history of prostate cancer   . Rotator cuff injury    "left arm; never repaired" (11/09/2012)    Patient Active Problem List   Diagnosis Date Noted   . Seasonal allergies 07/04/2018  . Chronic obstructive pulmonary disease ([place]) 07/04/2018  . Dizziness 01/20/2018  . Malaise 01/20/2018  . Decreased breath sounds 01/20/2018  . Needs flu shot 01/20/2018  . Chronic nonintractable headache 12/07/2017  . Back pain 11/01/2017  . Radicular pain of lower extremity 11/01/2017  . Prostate cancer ([place]) 11/01/2017  . Family history of breast cancer in first degree relative   . Personal history of prostate cancer   . Elliptocytosis on peripheral blood smear ([place]) 09/13/2016  . Prediabetes 03/22/2016  . PVD (peripheral vascular disease) with claudication ([place]) 11/15/2012  . Chest pain, localized 11/10/2012  . Hypokalemia 11/10/2012  . Bradycardia 11/09/2012  . Thrombocytopenia ([place]) 06/01/2012  . Microcytic anemia   . Atherosclerosis of native arteries of the extremities with intermittent claudication 06/16/2011  . Stable angina ([place]) 06/11/2011  . CAD (coronary artery disease)   . Chest pain, atypical 05/30/2011  . Iron deficiency anemia 10/03/2009  . DIVERTICULOSIS OF COLON 10/03/2009  . WEIGHT LOSS, ABNORMAL 10/03/2009  . CHEST PAIN UNSPECIFIED 09/13/2008  . Headache(784.0) 08/19/2008  . TIA 06/05/2008  . SHOULDER PAIN, LEFT 10/11/2007  . COUGH DUE TO ACE INHIBITORS 05/07/2007  . History of tobacco use disorder 04/25/2007  . Hyperlipidemia 04/21/2007  . Essential hypertension 04/21/2007  . GERD 04/21/2007    Past Surgical  History:  Procedure Laterality Date  . CARDIOVASCULAR STRESS TEST  Aug. 2014  . CATARACT EXTRACTION W/ INTRAOCULAR LENS IMPLANT Right ~ 2008  . CIRCUMCISION    . COLONOSCOPY    . CORONARY ANGIOPLASTY WITH STENT PLACEMENT  2014   "1" (11/09/2012)  . ILIAC ARTERY ANEURYSM REPAIR    . LEFT HEART CATH  05-31-12  . LEFT HEART CATHETERIZATION WITH CORONARY ANGIOGRAM N/A 06/01/2011   Procedure: LEFT HEART CATHETERIZATION WITH CORONARY ANGIOGRAM;  Surgeon: Hillary Bow, MD;  Location: [facility] CATH LAB;  Service:  Cardiovascular;  Laterality: N/A;  . LEFT HEART CATHETERIZATION WITH CORONARY ANGIOGRAM N/A 05/31/2012   Procedure: LEFT HEART CATHETERIZATION WITH CORONARY ANGIOGRAM;  Surgeon: Peter M Martinique, MD;  Location: [facility] CATH LAB;  Service: Cardiovascular;  Laterality: N/A;  . LEFT HEART CATHETERIZATION WITH CORONARY ANGIOGRAM N/A 08/01/2013   Procedure: LEFT HEART CATHETERIZATION WITH CORONARY ANGIOGRAM;  Surgeon: Burnell Blanks, MD;  Location: [facility] CATH LAB;  Service: Cardiovascular;  Laterality: N/A;  . PERCUTANEOUS CORONARY STENT INTERVENTION (PCI-S)  05/31/2012   Procedure: PERCUTANEOUS CORONARY STENT INTERVENTION (PCI-S);  Surgeon: Peter M Martinique, MD;  Location: [facility] CATH LAB;  Service: Cardiovascular;;  . PR VEIN BYPASS GRAFT,AORTO-FEM-POP Left 10/01/2009   left below knee popliteal artery to posterior tibial artery  . SHOULDER ARTHROSCOPY WITH OPEN ROTATOR CUFF REPAIR AND DISTAL CLAVICLE ACROMINECTOMY Left 01/12/2013   Procedure: LEFT SHOULDER ARTHROSCOPY WITH DEBRIDEMENT OPEN DISTAL CLAVICLE RESECTION ,acromioplastyAND ROTATOR CUFF REPAIR;  Surgeon: Yvette Rack., MD;  Location: [facility];  Service: Orthopedics;  Laterality: Left;        Home Medications    Prior to Admission medications   Medication Sig Start Date End Date Taking? Authorizing Provider  abiraterone Acetate (ZYTIGA) 250 MG tablet Take 1,000 mg by mouth daily before breakfast. Take on an empty stomach 1 hour before or 2 hours after a meal     [provider]  albuterol (PROVENTIL HFA;VENTOLIN HFA) 108 (90 Base) MCG/ACT inhaler Inhale 2 puffs into the lungs every 6 (six) hours as needed for wheezing or shortness of breath. 07/04/18   Henson, Vickie L, NP-C  amLODipine (NORVASC) 10 MG tablet TAKE 1 TABLET BY MOUTH ONCE DAILY 05/05/18   Henson, Vickie L, NP-C  aspirin EC 81 MG tablet Take 81 mg by mouth daily.     [provider]  atorvastatin (LIPITOR) 40 MG tablet Take 1 tablet (40 mg total) by mouth daily. 04/27/18  07/26/18  Lyda Jester M, PA-C  brimonidine (ALPHAGAN) 0.2 % ophthalmic solution Place 1 drop into the right eye 2 (two) times daily.  09/13/17   [provider]  ferrous sulfate 325 (65 FE) MG tablet Take 325 mg by mouth daily with breakfast.     [provider]  gabapentin (NEURONTIN) 600 MG tablet Take 600 mg by mouth daily.    [provider]  isosorbide mononitrate (IMDUR) 30 MG 24 hr tablet TAKE 1 TABLET(30 MG) BY MOUTH DAILY 05/08/18   Burnell Blanks, MD  loratadine (CLARITIN) 10 MG tablet TAKE 1 TABLET(10 MG) BY MOUTH DAILY 05/22/18   Henson, Vickie L, NP-C  losartan-hydrochlorothiazide (HYZAAR) 100-25 MG tablet Take 1 tablet by mouth daily. 01/20/18   Tysinger, Camelia Eng, PA-C  metFORMIN (GLUCOPHAGE) 500 MG tablet Take 500 mg by mouth 2 (two) times daily with a meal. Reported on 08/27/2015    [provider]  nitroGLYCERIN (NITROSTAT) 0.4 MG SL tablet Place 1 tablet (0.4 mg total) under the tongue  every 5 (five) minutes as needed for chest pain. 04/27/18   Lyda Jester M, PA-C  omeprazole (PRILOSEC) 20 MG capsule Take 20 mg by mouth 2 (two) times daily before a meal.    [provider]  predniSONE (DELTASONE) 5 MG tablet Take 5 mg by mouth daily before breakfast.     [provider]  tamsulosin (FLOMAX) 0.4 MG CAPS capsule Take 0.4 mg at bedtime by mouth.    [provider]  traMADol (ULTRAM) 50 MG tablet Take 1 tablet (50 mg total) by mouth every 8 (eight) hours as needed for severe pain. 02/15/18   Girtha Rm, NP-C    Family History Family History  Problem Relation Age of Onset  . Breast cancer Mother        dx 7s  . Hyperlipidemia Mother   . Hypertension Mother   . COPD Father        deceased  . Hyperlipidemia Father   . Hypertension Father   . Hypertension Sister   . Cancer Brother 29       Unknown cancer, possibly stomach  . Hypertension Brother   . Cancer Brother 70       Unknown, possibly  lung  . Hypertension Daughter   . Hypertension Son   . Colon cancer Neg Hx        pt is unclear about family hx    Social History Social History   Tobacco Use  . Smoking status: Former Smoker    Packs/day: 0.00    Types: Cigarettes    Last attempt to quit: 05/26/2011    Years since quitting: 7.1  . Smokeless tobacco: Never Used  Substance Use Topics  . Alcohol use: No  . Drug use: No     Allergies   Topiramate; Atenolol; Lisinopril; and Tramadol-acetaminophen   Review of Systems Review of Systems  Cardiovascular: Positive for chest pain.  All other systems reviewed and are negative.    Physical Exam Updated Vital Signs BP (!) 110/39   Pulse 69   Temp 98 F (36.7 C) (Oral)   Resp 19   SpO2 95%   Physical Exam Vitals signs and nursing note reviewed.  Constitutional:      General: He is not in acute distress.    Appearance: Normal appearance. He is well-developed.  HENT:     Head: Normocephalic and atraumatic.     Right Ear: Hearing normal.     Left Ear: Hearing normal.     Nose: Nose normal.  Eyes:     Conjunctiva/sclera: Conjunctivae normal.     Pupils: Pupils are equal, round, and reactive to light.  Neck:     Musculoskeletal: Normal range of motion and neck supple.  Cardiovascular:     Rate and Rhythm: Regular rhythm.     Heart sounds: S1 normal and S2 normal. No murmur. No friction rub. No gallop.   Pulmonary:     Effort: Pulmonary effort is normal. No respiratory distress.     Breath sounds: Normal breath sounds.  Chest:     Chest wall: No tenderness.  Abdominal:     General: Bowel sounds are normal.     Palpations: Abdomen is soft.     Tenderness: There is no abdominal tenderness. There is no guarding or rebound. Negative signs include Murphy's sign and McBurney's sign.     Hernia: No hernia is present.  Musculoskeletal: Normal range of motion.  Skin:    General: Skin is warm and dry.  Findings: No rash.  Neurological:     Mental  Status: He is alert and oriented to person, place, and time.     GCS: GCS eye subscore is 4. GCS verbal subscore is 5. GCS motor subscore is 6.     Cranial Nerves: No cranial nerve deficit.     Sensory: No sensory deficit.     Coordination: Coordination normal.  Psychiatric:        Speech: Speech normal.        Behavior: Behavior normal.        Thought Content: Thought content normal.      ED Treatments / Results  Labs (all labs ordered are listed, but only abnormal results are displayed) Labs Reviewed  CBC WITH DIFFERENTIAL/PLATELET - Abnormal; Notable for the following components:      Result Value   Hemoglobin 9.6 (*)    HCT 33.1 (*)    MCV 71.6 (*)    MCH 20.8 (*)    MCHC 29.0 (*)    RDW 16.9 (*)    All other components within normal limits  COMPREHENSIVE METABOLIC PANEL - Abnormal; Notable for the following components:   Glucose, Bld 123 (*)    Creatinine, Ser 1.34 (*)    Total Protein 6.4 (*)    GFR calc non Af Amer 50 (*)    GFR calc Af Amer 58 (*)    All other components within normal limits  LIPASE, BLOOD  TROPONIN I  TROPONIN I    EKG EKG Interpretation  Date/Time:  Monday July 24 2018 00:43:36 EDT Ventricular Rate:  72 PR Interval:    QRS Duration: 88 QT Interval:  412 QTC Calculation: 451 R Axis:   48 Text Interpretation:  Sinus or ectopic atrial rhythm Prolonged PR interval RSR' in V1 or V2, right VCD or RVH No significant change since last tracing Confirmed by Orpah Greek [phone]) on 07/24/2018 2:13:09 AM   Radiology Dg Chest 2 View  Result Date: 07/24/2018 CLINICAL DATA:  Left-sided chest pain for several hours EXAM: CHEST - 2 VIEW COMPARISON:  01/28/2018 FINDINGS: Cardiac shadow is stable. Aortic calcifications are again seen. The lungs are well aerated bilaterally. No focal infiltrate or sizable effusion is seen. Degenerative changes of the thoracic spine are noted. IMPRESSION: No acute abnormality noted. Electronically Signed   By:  Inez Catalina M.D.   On: 07/24/2018 01:58    Procedures Procedures (including critical care time)  Medications Ordered in ED Medications  nitroGLYCERIN (NITROSTAT) SL tablet 0.4 mg (has no administration in time range)  aspirin chewable tablet 324 mg (324 mg Oral Given 07/24/18 0126)     Initial Impression / Assessment and Plan / ED Course  I have reviewed the triage vital signs and the nursing notes.  Pertinent labs & imaging results that were available during my care of the patient were reviewed by me and considered in my medical decision making (see chart for details).        Patient presents to the emergency department for evaluation of chest pain.  Patient had onset of left-sided chest pain at rest.  Pain radiated to the left shoulder.  No associated nausea, diaphoresis, shortness of breath.  He has had improvement after nitroglycerin.  He is currently pain-free.  EKG unchanged from previous at arrival.  Troponin negative.  I had a long conversation with the patient about possible admission.  Patient does have a cardiac history and I felt that the symptoms could possibly be related to  cardiac etiology.  I recommended admission, but did have a discussion with him about the risks of admission with the current coronavirus pandemic.  Patient did not wish to be admitted because of this.  He understands that I cannot completely rule out MI and that his pain could be from a cardiac cause.  If he goes home he is at risk for having a heart attack.  After lengthy discussion he decided that he would like to be discharged.  He therefore had a second troponin at the 3-hour mark which was still negative, which is reassuring.  He continues to be pain-free.  He will be discharged, follow-up with his cardiologist, return if symptoms return.  Final Clinical Impressions(s) / ED Diagnoses   Final diagnoses:  Chest pain, unspecified type    ED Discharge Orders    None       Orpah Greek,  MD 07/24/18 [phone]

## 2018-07-24 NOTE — ED Triage Notes (Signed)
Pt reports L side chest pain radiating to L arm and L shoulder pain starting at 10:30 pm while resting. Pt denies SHOB, N/V, dizziness.

## 2018-07-26 ENCOUNTER — Other Ambulatory Visit: Payer: Self-pay

## 2018-07-26 ENCOUNTER — Ambulatory Visit (INDEPENDENT_AMBULATORY_CARE_PROVIDER_SITE_OTHER): Payer: Medicare HMO | Admitting: Family Medicine

## 2018-07-26 ENCOUNTER — Encounter: Payer: Self-pay | Admitting: Family Medicine

## 2018-07-26 VITALS — BP 148/75 | HR 70 | Resp 16 | Wt 254.0 lb

## 2018-07-26 DIAGNOSIS — I10 Essential (primary) hypertension: Secondary | ICD-10-CM

## 2018-07-26 DIAGNOSIS — M7989 Other specified soft tissue disorders: Secondary | ICD-10-CM

## 2018-07-26 DIAGNOSIS — I739 Peripheral vascular disease, unspecified: Secondary | ICD-10-CM | POA: Diagnosis not present

## 2018-07-26 NOTE — Progress Notes (Addendum)
   Subjective:    [name], male    DOB: Jul 31, 1940, 78 y.o.   MRN: [id number]  HPI Chief Complaint  Patient presents with  . left foot swelling    left foot swelling. been going for a while and woke up this morning with swelling, no pain.    Documentation for virtual telephone encounter. No video access per patient.   The patient was located at home. 2 patient identifiers used.  The provider was located at home.  The patient did consent to this visit and is aware of possible charges through their insurance for this visit.  The other persons participating in this telemedicine service were none.  Complains of intermittent left foot swelling for the past couple of weeks. The swelling lasts only an hour or two after waking up then completely resolves without intervention. This happened after sleeping in his recliner last night and his legs were hanging down for several hours.  States only the top of his mid foot was mildly swollen and has gone back to normal already. This has been the case on 3 occasions including this morning. Denies injury. Denies ankle or toes being swollen.  No leg or calf swelling.  No redness or warmth. No pain.  States he can get his shoe on fine.   States this has happened on two other times in the morning only and goes away after a couple of hours.   Vein specialist Dr. Scot Dock. History of PVD with claudication. He denies claudication symptoms today. Reports 3rd toe on his left foot which is the toe he had surgery on per patient. Planning to contact Dr. Scot Dock for follow up appointment.   Denies fever, chills, dizziness, chest pain, palpitations, shortness of breath, orthopnea, PND, cough,  abdominal pain, N/V/D, urinary symptoms.   Denies leg or calf swelling, tenderness, redness or pain with ambulation.   No history of DVT.   Reviewed allergies, medications, past medical, surgical, family, and social history.   Review of Systems  Pertinent positives and negatives in the history of present illness.     Objective:   Physical Exam BP (!) 148/75   Pulse 70   Resp 16   Wt 254 lb (115.2 kg)   BMI 36.45 kg/m   Alert and oriented and in no acute distress. Speaking in complete sentences without any difficulty. Normal mood and thought process.  Denies significant weight gain.       Assessment & Plan:  Swelling of left foot  Essential hypertension  PVD (peripheral vascular disease) with claudication ([place])  Discussed limitations of telephone encounter.  Discussed that intermittent left foot swelling that goes away within an hour or two does not sound like anything worrisome and no sign of fluid overload. Most likely this is related to sleeping in his recliner and not medication or cardiac related. No sign of DVT. Advised to keep a close eye on BP, edema and report back any new or worsening symptoms. He plans to call and schedule follow up with Dr. Scot Dock but no sign of claudication symptoms.  BP is elevated. Low sodium diet encouraged and continue monitoring BP at home.   Time spent on call was 14 minutes and in review of previous records 2 minutes total.  This virtual service is not related to other E/M service within previous 7 days.  99441 (5-28min) 99442 (11-1min) 99443 (21-37min)

## 2018-07-27 ENCOUNTER — Ambulatory Visit: Payer: Medicare HMO | Admitting: Family Medicine

## 2018-07-27 NOTE — Addendum Note (Signed)
Addended by: Girtha Rm on: 07/27/2018 11:02 AM   Modules accepted: Level of Service

## 2018-07-28 ENCOUNTER — Other Ambulatory Visit: Payer: Self-pay | Admitting: Cardiovascular Disease

## 2018-07-31 ENCOUNTER — Telehealth: Payer: Self-pay | Admitting: Cardiovascular Disease

## 2018-07-31 ENCOUNTER — Ambulatory Visit (INDEPENDENT_AMBULATORY_CARE_PROVIDER_SITE_OTHER): Payer: Medicare HMO | Admitting: Family Medicine

## 2018-07-31 ENCOUNTER — Other Ambulatory Visit: Payer: Self-pay

## 2018-07-31 ENCOUNTER — Encounter: Payer: Self-pay | Admitting: Cardiovascular Disease

## 2018-07-31 ENCOUNTER — Telehealth (INDEPENDENT_AMBULATORY_CARE_PROVIDER_SITE_OTHER): Payer: Medicare HMO | Admitting: Cardiovascular Disease

## 2018-07-31 ENCOUNTER — Other Ambulatory Visit: Payer: Self-pay | Admitting: Family Medicine

## 2018-07-31 ENCOUNTER — Encounter: Payer: Self-pay | Admitting: Family Medicine

## 2018-07-31 VITALS — BP 153/75 | HR 74 | Ht 70.0 in | Wt 254.0 lb

## 2018-07-31 VITALS — BP 153/74 | HR 75 | Wt 254.0 lb

## 2018-07-31 DIAGNOSIS — R7303 Prediabetes: Secondary | ICD-10-CM | POA: Diagnosis not present

## 2018-07-31 DIAGNOSIS — J302 Other seasonal allergic rhinitis: Secondary | ICD-10-CM

## 2018-07-31 DIAGNOSIS — I10 Essential (primary) hypertension: Secondary | ICD-10-CM

## 2018-07-31 DIAGNOSIS — I25118 Atherosclerotic heart disease of native coronary artery with other forms of angina pectoris: Secondary | ICD-10-CM

## 2018-07-31 DIAGNOSIS — M7989 Other specified soft tissue disorders: Secondary | ICD-10-CM

## 2018-07-31 DIAGNOSIS — E7800 Pure hypercholesterolemia, unspecified: Secondary | ICD-10-CM | POA: Diagnosis not present

## 2018-07-31 DIAGNOSIS — I208 Other forms of angina pectoris: Secondary | ICD-10-CM

## 2018-07-31 DIAGNOSIS — R0981 Nasal congestion: Secondary | ICD-10-CM

## 2018-07-31 MED ORDER — LORATADINE 10 MG PO TABS: ORAL_TABLET | ORAL | 2 refills | Status: DC

## 2018-07-31 MED ORDER — FLUTICASONE PROPIONATE 50 MCG/ACT NA SUSP: 2.0000 | Freq: Every day | NASAL | 1 refills | Status: DC

## 2018-07-31 NOTE — Telephone Encounter (Signed)
Virtual Visit Pre-Appointment Phone Call   TELEPHONE CALL NOTE  Joel [name] has been deemed a candidate for a follow-up tele-health visit to limit community exposure during the Covid-19 pandemic. I spoke with the patient via phone to ensure availability of phone/video source, confirm preferred email & phone number, and discuss instructions and expectations.  I reminded CREEDON DANIELSKI to be prepared with any vital sign and/or heart rhythm information that could potentially be obtained via home monitoring, at the time of his visit. I reminded JAKALEB PAYER to expect a phone call at the time of his visit if his visit.  Patient agrees to consent.  Cleon Gustin, RN 07/31/2018 1:09 PM    FULL LENGTH CONSENT FOR TELE-HEALTH VISIT   I hereby voluntarily request, consent and authorize [facility] and its employed or contracted physicians, physician assistants, nurse practitioners or other licensed health care professionals (the Practitioner), to provide me with telemedicine health care services (the "Services") as deemed necessary by the treating Practitioner. I acknowledge and consent to receive the Services by the Practitioner via telemedicine. I understand that the telemedicine visit will involve communicating with the Practitioner through live audiovisual communication technology and the disclosure of certain medical information by electronic transmission. I acknowledge that I have been given the opportunity to request an in-person assessment or other available alternative prior to the telemedicine visit and am voluntarily participating in the telemedicine visit.  I understand that I have the right to withhold or withdraw my consent to the use of telemedicine in the course of my care at any time, without affecting my right to future care or treatment, and that the Practitioner or I may terminate the telemedicine visit at any time. I understand that I have the right to inspect  all information obtained and/or recorded in the course of the telemedicine visit and may receive copies of available information for a reasonable fee.  I understand that some of the potential risks of receiving the Services via telemedicine include:  Marland Kitchen Delay or interruption in medical evaluation due to technological equipment failure or disruption; . Information transmitted may not be sufficient (e.g. poor resolution of images) to allow for appropriate medical decision making by the Practitioner; and/or  . In rare instances, security protocols could fail, causing a breach of personal health information.  Furthermore, I acknowledge that it is my responsibility to provide information about my medical history, conditions and care that is complete and accurate to the best of my ability. I acknowledge that Practitioner's advice, recommendations, and/or decision may be based on factors not within their control, such as incomplete or inaccurate data provided by me or distortions of diagnostic images or specimens that may result from electronic transmissions. I understand that the practice of medicine is not an exact science and that Practitioner makes no warranties or guarantees regarding treatment outcomes. I acknowledge that I will receive a copy of this consent concurrently upon execution via email to the email address I last provided but may also request a printed copy by calling the office of [facility].    I understand that my insurance will be billed for this visit.   I have read or had this consent read to me. . I understand the contents of this consent, which adequately explains the benefits and risks of the Services being provided via telemedicine.  . I have been provided ample opportunity to ask questions regarding this consent and the Services and have had my questions  answered to my satisfaction. . I give my informed consent for the services to be provided through the use of telemedicine in my  medical care  By participating in this telemedicine visit I agree to the above.

## 2018-07-31 NOTE — Telephone Encounter (Signed)
Dr Angelena Form to review Will not see pt today ./cy

## 2018-07-31 NOTE — Patient Instructions (Signed)
Medication Instructions:  Your physician recommends that you continue on your current medications as directed. Please refer to the Current Medication list given to you today.  Please call the office back if you are not taking atorvastatin 40 mg daily  If you need a refill on your cardiac medications before your next appointment, please call your pharmacy.   Lab work: None Ordered  If you have labs (blood work) drawn today and your tests are completely normal, you will receive your results only by: Marland Kitchen MyChart Message (if you have MyChart) OR . A paper copy in the mail If you have any lab test that is abnormal or we need to change your treatment, we will call you to review the results.  Testing/Procedures: None ordered  Follow-Up: At [facility], you and your health needs are our priority.  As part of our continuing mission to provide you with exceptional heart care, we have created designated Provider Care Teams.  These Care Teams include your primary Cardiologist (physician) and Advanced Practice Providers (APPs -  Physician Assistants and Nurse Practitioners) who all work together to provide you with the care you need, when you need it. . You will need a follow up appointment in 6 months.  Please call our office 2 months in advance to schedule this appointment.  You may see Darlina Guys, MD or one of the following Advanced Practice Providers on your designated Care Team:   . Lyda Jester, PA-C . Dayna Dunn, PA-C . Ermalinda Barrios, PA-C  Any Other Special Instructions Will Be Listed Below (If Applicable).

## 2018-07-31 NOTE — Telephone Encounter (Signed)
[name] has chronic chest pain. I will be glad to see him today with a virtual visit if he is agreeable to this. Gerald Stabs

## 2018-07-31 NOTE — Telephone Encounter (Signed)
New Message:    Gabriel Cirri from [place] office [phone]) called. She wants pt to be seen or Virtual Visit  forr Chest Pain, SOB, and High Blood Pressure. He had a Virtual Visit today(see in [facility]). Pt was also seen in [place] on 07-24-18 for Chest Pain(see in Epic).

## 2018-07-31 NOTE — Progress Notes (Signed)
Virtual Visit via Video Note   This visit type was conducted due to national recommendations for restrictions regarding the COVID-19 Pandemic (e.g. social distancing) in an effort to limit this patient's exposure and mitigate transmission in our community.  Due to his co-morbid illnesses, this patient is at least at moderate risk for complications without adequate follow up.  This format is felt to be most appropriate for this patient at this time.  All issues noted in this document were discussed and addressed.  A limited physical exam was performed with this format.  Please refer to the patient's chart for his consent to telehealth for [facility].   Evaluation Performed:  Follow-up visit  Date:  07/31/2018   ID:  Joel [name], Joel [name] [birth year], MRN [id number]  Patient Location: Home Provider Location: Office  PCP:  Girtha Rm, NP-C  Cardiologist:  Lauree Chandler, MD  Electrophysiologist:  None   Chief Complaint:  Follow up- chest pain  History of Present Illness:    Joel [name] is a 78 y.o. male with history of HTN, HLD, CAD, PAD and DM who is here today for cardiac follow up. He has been followed in the past by Dr. Lia Foyer. Cardiac cath February 2014 with severe OM2 stenosis treated with a bare metal stent. Admitted to [facility] 11/09/12 with chest pain. Ruled out for MI. Lexiscan stress myoview 11/16/12 without ischemia. Normal LVEF.  I saw him in the office 07/27/13 and he c/o exertional chest pain c/w unstable angina. Cardiac cath 08/01/13 with patent stent Circumflex and mild to moderate disease in the LAD and RCA. His PAD is followed in [facility] by Dr. Scot Dock. He was seen here 03/04/14 and had c/o chest pain after working on his car but quickly resolved. He was seen in the ED 09/10/14 with atypical chest pain. Troponin was negative x 2 and EKG was unchanged. Multiple ED visits with chest pain in 2017. Nuclear stress test November 2018 with EF 55% with moderate inferior  defect that partially improves consistent with ischemia and possible soft tissue attenuation. Small region of anterior anterior septal ischemia. Diagnosed with prostate cancer in 2019. I saw him in the ED 07/15/17 and he had atypical chest pain with negative troponin and no EKG changes. He was seen in my office 08/01/17 and doing well. He was seen again in the ED 01/28/18 with atypical chest pain, negative troponin. He was most recently seen in the ED 07/24/18 with chest pain. Troponin was negative and his EKG had no changes. He was sent home.   He was seen in his primary care office this am and we were asked to see him today by that office. He tells me today that he feels great. He had left shoulder pain last week. Denies chest pain. Dyspnea at baseline. No weight change or LE edema. Has been very active.   The patient does not have symptoms concerning for COVID-19 infection (fever, chills, cough, or new shortness of breath).    Past Medical History:  Diagnosis Date   Atypical chest pain    CAD (coronary artery disease)    a. Multiple prior evaluations then 05/2012 s/p BMS to OM2. b. Nuc 11/2012 wnl. c. Cath 2015: patent stent, moderate LAD/RCA dz, treated medically.   Cancer ([place])    Hx: of prostate   Cataract    CKD (chronic kidney disease), stage II    COPD (chronic obstructive pulmonary disease) (HCC)    Cough secondary to angiotensin  converting enzyme inhibitor (ACE-I)    Diabetes mellitus without complication (HCC)    Diverticulosis    DVT (deep venous thrombosis) ([place])    Family history of breast cancer in first degree relative    GERD (gastroesophageal reflux disease)    Headache(784.0)    Hx: of   History of blood transfusion    "I've had 2; don't know what it was related to" (11/09/2012)   HLD (hyperlipidemia)    HTN (hypertension)    Iron deficiency anemia    Microcytic anemia    Peripheral vascular disease ([place])    a. L pop-tibial bypass 2011, followed by  [facility].   Personal history of prostate cancer    Rotator cuff injury    "left arm; never repaired" (11/09/2012)   Past Surgical History:  Procedure Laterality Date   CARDIOVASCULAR STRESS TEST  Aug. 2014   CATARACT EXTRACTION W/ INTRAOCULAR LENS IMPLANT Right ~ 2008   CIRCUMCISION     COLONOSCOPY     CORONARY ANGIOPLASTY WITH STENT PLACEMENT  2014   "1" (11/09/2012)   ILIAC ARTERY ANEURYSM REPAIR     LEFT HEART CATH  05-31-12   LEFT HEART CATHETERIZATION WITH CORONARY ANGIOGRAM N/A 06/01/2011   Procedure: LEFT HEART CATHETERIZATION WITH CORONARY ANGIOGRAM;  Surgeon: Hillary Bow, MD;  Location: [facility] CATH LAB;  Service: Cardiovascular;  Laterality: N/A;   LEFT HEART CATHETERIZATION WITH CORONARY ANGIOGRAM N/A 05/31/2012   Procedure: LEFT HEART CATHETERIZATION WITH CORONARY ANGIOGRAM;  Surgeon: Peter M Martinique, MD;  Location: [facility] CATH LAB;  Service: Cardiovascular;  Laterality: N/A;   LEFT HEART CATHETERIZATION WITH CORONARY ANGIOGRAM N/A 08/01/2013   Procedure: LEFT HEART CATHETERIZATION WITH CORONARY ANGIOGRAM;  Surgeon: Burnell Blanks, MD;  Location: [facility] CATH LAB;  Service: Cardiovascular;  Laterality: N/A;   PERCUTANEOUS CORONARY STENT INTERVENTION (PCI-S)  05/31/2012   Procedure: PERCUTANEOUS CORONARY STENT INTERVENTION (PCI-S);  Surgeon: Peter M Martinique, MD;  Location: [facility] CATH LAB;  Service: Cardiovascular;;   PR VEIN BYPASS GRAFT,AORTO-FEM-POP Left 10/01/2009   left below knee popliteal artery to posterior tibial artery   SHOULDER ARTHROSCOPY WITH OPEN ROTATOR CUFF REPAIR AND DISTAL CLAVICLE ACROMINECTOMY Left 01/12/2013   Procedure: LEFT SHOULDER ARTHROSCOPY WITH DEBRIDEMENT OPEN DISTAL CLAVICLE RESECTION ,acromioplastyAND ROTATOR CUFF REPAIR;  Surgeon: Yvette Rack., MD;  Location: [place];  Service: Orthopedics;  Laterality: Left;     Current Meds  Medication Sig   abiraterone Acetate (ZYTIGA) 250 MG tablet Take 1,000 mg by mouth daily before breakfast. Take on an  empty stomach 1 hour before or 2 hours after a meal    albuterol (PROVENTIL HFA;VENTOLIN HFA) 108 (90 Base) MCG/ACT inhaler Inhale 2 puffs into the lungs every 6 (six) hours as needed for wheezing or shortness of breath.   amLODipine (NORVASC) 10 MG tablet TAKE 1 TABLET BY MOUTH ONCE DAILY   aspirin EC 81 MG tablet Take 81 mg by mouth daily.    atorvastatin (LIPITOR) 40 MG tablet Take 1 tablet (40 mg total) by mouth daily.   brimonidine (ALPHAGAN) 0.2 % ophthalmic solution Place 1 drop into the right eye 2 (two) times daily.    diclofenac sodium (VOLTAREN) 1 % GEL Apply 2 g topically 4 (four) times daily.   ferrous sulfate 325 (65 FE) MG tablet Take 325 mg by mouth daily with breakfast.    fluticasone (FLONASE) 50 MCG/ACT nasal spray SHAKE LIQUID AND USE 2 SPRAYS IN EACH NOSTRIL DAILY   gabapentin (NEURONTIN) 600 MG tablet Take 600 mg  by mouth daily.   isosorbide mononitrate (IMDUR) 30 MG 24 hr tablet TAKE 1 TABLET(30 MG) BY MOUTH DAILY   loratadine (CLARITIN) 10 MG tablet TAKE 1 TABLET(10 MG) BY MOUTH DAILY   losartan-hydrochlorothiazide (HYZAAR) 100-25 MG tablet TAKE 1 TABLET BY MOUTH DAILY   nitroGLYCERIN (NITROSTAT) 0.4 MG SL tablet Place 1 tablet (0.4 mg total) under the tongue every 5 (five) minutes as needed for chest pain.   omeprazole (PRILOSEC) 20 MG capsule Take 20 mg by mouth 2 (two) times daily before a meal.   predniSONE (DELTASONE) 5 MG tablet Take 5 mg by mouth daily before breakfast.    tamsulosin (FLOMAX) 0.4 MG CAPS capsule Take 0.4 mg at bedtime by mouth.   traMADol (ULTRAM) 50 MG tablet Take 1 tablet (50 mg total) by mouth every 8 (eight) hours as needed for severe pain.   Current Facility-Administered Medications for the 07/31/18 encounter (Telemedicine) with Burnell Blanks, MD  Medication   0.9 %  sodium chloride infusion     Allergies:   Topiramate; Atenolol; Lisinopril; and Tramadol-acetaminophen   Social History   Tobacco Use   Smoking  status: Former Smoker    Packs/day: 0.00    Types: Cigarettes    Last attempt to quit: 05/26/2011    Years since quitting: 7.1   Smokeless tobacco: Never Used  Substance Use Topics   Alcohol use: No   Drug use: No     Family Hx: The patient's family history includes Breast cancer in his mother; COPD in his father; Cancer (age of onset: 24) in his brother; Cancer (age of onset: 30) in his brother; Hyperlipidemia in his father and mother; Hypertension in his brother, daughter, father, mother, sister, and son. There is no history of Colon cancer.  ROS:   Please see the history of present illness.    All other systems reviewed and are negative.   Prior CV studies:   The following studies were reviewed today:   Labs/Other Tests and Data Reviewed:    EKG:  No ECG reviewed.  Recent Labs: 07/23/2018: ALT 17; BUN 16; Creatinine, Ser 1.34; Hemoglobin 9.6; Platelets 171; Potassium 3.6; Sodium 141   Recent Lipid Panel Lab Results  Component Value Date/Time   CHOL 174 10/24/2017 08:22 AM   TRIG 135 10/24/2017 08:22 AM   HDL 47 10/24/2017 08:22 AM   CHOLHDL 3.7 10/24/2017 08:22 AM   CHOLHDL 5 02/06/2013 07:41 AM   LDLCALC 100 (H) 10/24/2017 08:22 AM    Wt Readings from Last 3 Encounters:  07/31/18 254 lb (115.2 kg)  07/31/18 254 lb (115.2 kg)  07/26/18 254 lb (115.2 kg)     Objective:    Vital Signs:  BP (!) 153/75    Pulse 74    Ht 5\' 10"  (1.778 m)    Wt 254 lb (115.2 kg)    BMI 36.45 kg/m    No exam, telephone visit (converted to phone after technical difficulties with patient's webcam on phone)  ASSESSMENT & PLAN:    1. CAD with stable angina: He has chronic chest pain. Recent ED visit last week with left shoulder pain. He says this has totally resolved over the past 6 days. No recurrence. No chest pain over last week. Most recent cath 2015 with stable CAD. Nuclear stress test in November 2018 with no large areas of ischemia. No cardiac workup at this time. Will continue  ASA, statin and Imdur.   2. HTN: BP has been well controlled at home. This  is managed in primary care. He saw them today. He will follow BP at report to them.   3. Hyperlipidemia: Lipids followed in primary care.  Will continue statin. He is not sure if he has atorvastatin in his med list. Will check when he gets home.     COVID-19 Education: The signs and symptoms of COVID-19 were discussed with the patient and how to seek care for testing (follow up with PCP or arrange E-visit).  The importance of social distancing was discussed today.  Time:   Today, I have spent 15 minutes with the patient with telehealth technology discussing the above problems.     Medication Adjustments/Labs and Tests Ordered: Current medicines are reviewed at length with the patient today.  Concerns regarding medicines are outlined above.   Tests Ordered: No orders of the defined types were placed in this encounter.   Medication Changes: No orders of the defined types were placed in this encounter.   Disposition:  Follow up in 6 month(s)  Signed, Lauree Chandler, MD  07/31/2018 1:55 PM    [place]

## 2018-07-31 NOTE — Progress Notes (Signed)
Subjective:   Documentation for virtual telephone encounter.  Interactive audio and video telecommunications were attempted between this provider and patient, however due to patient not having access to video capability, we continued and completed visit with audio only.  The patient was located at home. 2 patient identifiers used.  The provider was located in the office. The patient did consent to this visit and is aware of possible charges through their insurance for this visit.  The other persons participating in this telemedicine service were none.     [name], male    DOB: 01/11/1941, 78 y.o.   MRN: [id number]  HPI Chief Complaint  Patient presents with  . follow-up    follow-up-bp. yesterday you started having Chest pain and SOB,  cough is much better, did cough alittle yesterday. bp has been running high. unable to check bs at home.    We are following up on several chronic health conditions including allergies, cough, hypertension, intermittent left foot swelling and chest pain.  Cough- reports history of COPD. This was diagnosed at his PCP prior to coming here per patient. Uses Albuterol rarely.  States yesterday he was outside and started having nasal congestion, postnasal drainage and a cough.  States he coughed up some yellowish sputum.  He did use his albuterol and this seemed to help.  He has not needed it since.. Does not need this regularly.  No longer having a productive cough but he is having postnasal drainage this morning.  He has underlying allergies and has been taking Claritin as needed.  States he only has 3 tablets left and needs a refill.  Chest pain- yesterday he had central chest pain that was intermittent. 2 episodes this morning already. Reports it felt like chest tightness, lasted 5 minutes or so and goes away.  Non exertional, no shortness of breath.  The pain does not radiate.  States this pain is not as severe as when he went to the  ED on 07/24/18.  At his ED visit for chest pain, he was advised that he should be admitted for rule out. He declined admission due to Covid-19 pandemic. Advised to follow up with cardiology if symptoms returned.  Sees Dr. Angelena Form for stable angina and last there in January. Due to follow up in July. Reports he has left a message to schedule but has not received a call back as of today.   HTN- BP has been in the 150s/70s. Reports good compliance with medications. No increased sodium in diet.   Intermittent foot swelling- no longer having issues. No LE edema.   Diabetes- last Hgb A1c 6.5% in October. Prediabetes prior for years.  Taking Metformin. Does not check BS at home.   Denies fever, chills, dizziness, palpitations, shortness of breath, orthopnea, abdominal pain, N/V/D, urinary symptoms.   Reviewed allergies, medications, past medical, surgical, family, and social history.     Review of Systems Pertinent positives and negatives in the history of present illness.     Objective:   Physical Exam BP (!) 153/74   Pulse 75   Wt 254 lb (115.2 kg)   BMI 36.45 kg/m   Alert and oriented and in no acute distress. Speaking in coherent complete sentences. Normal speech, mood and thought process.       Assessment & Plan:  Seasonal allergies - Plan: fluticasone (FLONASE) 50 MCG/ACT nasal spray, loratadine (CLARITIN) 10 MG tablet  Essential hypertension  Stable angina (HCC)  Nasal congestion - Plan:  fluticasone (FLONASE) 50 MCG/ACT nasal spray  Swelling of left foot  Prediabetes  Advised to take Claritin daily for now and start using Flonase to see if this helps with allergy symptoms and cough.  HTN- BP has been elevated somewhat at home recently. Encouraged low sodium diet.  Chronic chest pain- reviewed recent note from cardiology and ED visit. Pain has been brief and intermittent without any exertional or new symptoms. Has not used nitroglycerin. Was advised in the ED to  follow up with cardiology and return for recurring chest pain. He is aware that if his pain returns and becomes more severe or persistent to call 911 or go to the ED.  My CMA, Gabriel Cirri, will attempt to call and schedule patient's visit with Dr. Reatha Armour office.  No longer having LE edema.  He will need follow up on diabetes and AWV in May or June.   Time spent on call was 19 minutes and in review of previous records 5 minutes total.  This virtual service is not related to other E/M service within previous 7 days.

## 2018-08-09 DIAGNOSIS — R51 Headache: Secondary | ICD-10-CM | POA: Diagnosis not present

## 2018-08-09 DIAGNOSIS — M791 Myalgia, unspecified site: Secondary | ICD-10-CM | POA: Diagnosis not present

## 2018-08-09 DIAGNOSIS — G518 Other disorders of facial nerve: Secondary | ICD-10-CM | POA: Diagnosis not present

## 2018-08-09 DIAGNOSIS — M542 Cervicalgia: Secondary | ICD-10-CM | POA: Diagnosis not present

## 2018-08-10 ENCOUNTER — Other Ambulatory Visit: Payer: Self-pay | Admitting: Family Medicine

## 2018-08-14 DIAGNOSIS — H01026 Squamous blepharitis left eye, unspecified eyelid: Secondary | ICD-10-CM | POA: Diagnosis not present

## 2018-08-14 DIAGNOSIS — H01023 Squamous blepharitis right eye, unspecified eyelid: Secondary | ICD-10-CM | POA: Diagnosis not present

## 2018-08-14 DIAGNOSIS — H401230 Low-tension glaucoma, bilateral, stage unspecified: Secondary | ICD-10-CM | POA: Diagnosis not present

## 2018-08-14 DIAGNOSIS — H04123 Dry eye syndrome of bilateral lacrimal glands: Secondary | ICD-10-CM | POA: Diagnosis not present

## 2018-08-14 DIAGNOSIS — H18413 Arcus senilis, bilateral: Secondary | ICD-10-CM | POA: Diagnosis not present

## 2018-08-14 DIAGNOSIS — H11823 Conjunctivochalasis, bilateral: Secondary | ICD-10-CM | POA: Diagnosis not present

## 2018-08-14 DIAGNOSIS — H11153 Pinguecula, bilateral: Secondary | ICD-10-CM | POA: Diagnosis not present

## 2018-08-14 DIAGNOSIS — H47231 Glaucomatous optic atrophy, right eye: Secondary | ICD-10-CM | POA: Diagnosis not present

## 2018-08-19 ENCOUNTER — Ambulatory Visit (HOSPITAL_COMMUNITY)
Admission: EM | Admit: 2018-08-19 | Discharge: 2018-08-19 | Disposition: A | Discharge status: Home / Self Care | Payer: Medicare HMO | Attending: Family Medicine | Admitting: Family Medicine

## 2018-08-19 ENCOUNTER — Other Ambulatory Visit: Payer: Self-pay

## 2018-08-19 DIAGNOSIS — R51 Headache: Secondary | ICD-10-CM | POA: Diagnosis not present

## 2018-08-19 DIAGNOSIS — R519 Headache, unspecified: Secondary | ICD-10-CM

## 2018-08-19 MED ORDER — KETOROLAC TROMETHAMINE 60 MG/2ML IM SOLN
30.0000 mg | Freq: Once | INTRAMUSCULAR | Status: AC
  Administered 2018-08-19: 30 mg via INTRAMUSCULAR

## 2018-08-19 MED ORDER — KETOROLAC TROMETHAMINE 30 MG/ML IJ SOLN
INTRAMUSCULAR | Status: AC
  Filled 2018-08-19: qty 1

## 2018-08-19 NOTE — ED Provider Notes (Signed)
[place]    CSN: [id number] Arrival date & time: 08/19/18  1009  History    Chief Complaint  Patient presents with  . Headache     Joel [name] is a 78 y.o. male here for evaluation of an acute headache.  Duration: 1 day Laterality: bilateral Quality: achy, tingly Severity: 5/10 Associated symptoms: none Therapies tried: Goody Hx of migraines: no, but he does see headache specialist. Other neurologic s/s's? No  ROS:  Neuro: +HA MSK: denies neck pain  Past Medical History:  Diagnosis Date  . Atypical chest pain   . CAD (coronary artery disease)    a. Multiple prior evaluations then 05/2012 s/p BMS to OM2. b. Nuc 11/2012 wnl. c. Cath 2015: patent stent, moderate LAD/RCA dz, treated medically.  . Cancer ([place])    Hx: of prostate  . Cataract   . CKD (chronic kidney disease), stage II   . COPD (chronic obstructive pulmonary disease) ([place])   . Cough secondary to angiotensin converting enzyme inhibitor (ACE-I)   . Diabetes mellitus without complication ([place])   . Diverticulosis   . DVT (deep venous thrombosis) ([place])   . Family history of breast cancer in first degree relative   . GERD (gastroesophageal reflux disease)   . Headache(784.0)    Hx: of  . History of blood transfusion    "I've had 2; don't know what it was related to" (11/09/2012)  . HLD (hyperlipidemia)   . HTN (hypertension)   . Iron deficiency anemia   . Microcytic anemia   . Peripheral vascular disease ([place])    a. L pop-tibial bypass 2011, followed by [facility].  . Personal history of prostate cancer   . Rotator cuff injury    "left arm; never repaired" (11/09/2012)   Allergies as of 08/19/2018      Reactions   Topiramate Shortness Of Breath, Other (See Comments)    leg cramps   Atenolol Other (See Comments)   bradycardia   Lisinopril Cough   Tramadol-acetaminophen Cough   Tolerates plain Tramadol           BP (!) 133/54 (BP Location: Right Arm)   Pulse 69   Temp 98.1 F (36.7  C) (Oral)   Resp 16   SpO2 99%  Gen: awake, alert, appearing stated age Eyes: PERRLA, EOMi, no injection Heart: RRR Lungs: CTAB, no accessory muscle use Abd: BS+, soft, NT, ND Neuro: CN 2-12 grossly intact, fluent and goal-oriented speech, DTR's equal and symmetric in UE's and LE's MSK: 5/5 strength throughout, no TTP over cervical paraspinal musculature or occipital triangle region Psych: Age appropriate judgment and insight, normal affect and mood   Final Clinical Impressions(s) / UC Diagnoses   Final diagnoses:  Acute nonintractable headache, unspecified headache type   Pt drove, will give Toradol as abortive. Neck stretches/exercises. F/u with headache specialist this coming business week. If worsening, go to ER.    Discharge Instructions     OK to take Tylenol 1000 mg (2 extra strength tabs) or 975 mg (3 regular strength tabs) every 6 hours as needed.  Heat (pad or rice pillow in microwave) over affected area, 10-15 minutes twice daily.   EXERCISES RANGE OF MOTION (ROM) AND STRETCHING EXERCISES  These exercises may help you when beginning to rehabilitate your issue. In order to successfully resolve your symptoms, you must improve your posture. These exercises are designed to help reduce the forward-head and rounded-shoulder posture which contributes to this condition. Your symptoms may resolve with  or without further involvement from your physician, physical therapist or athletic trainer. While completing these exercises, remember:   Restoring tissue flexibility helps normal motion to return to the joints. This allows healthier, less painful movement and activity.  An effective stretch should be held for at least 20 seconds, although you may need to begin with shorter hold times for comfort.  A stretch should never be painful. You should only feel a gentle lengthening or release in the stretched tissue.  Do not do any stretch or exercise that you cannot tolerate.   STRETCH- Axial Extensors  Lie on your back on the floor. You may bend your knees for comfort. Place a rolled-up hand towel or dish towel, about 2 inches in diameter, under the part of your head that makes contact with the floor.  Gently tuck your chin, as if trying to make a "double chin," until you feel a gentle stretch at the base of your head.  Hold 15-20 seconds. Repeat 2-3 times. Complete this exercise 1 time per day.   STRETCH - Axial Extension   Stand or sit on a firm surface. Assume a good posture: chest up, shoulders drawn back, abdominal muscles slightly tense, knees unlocked (if standing) and feet hip width apart.  Slowly retract your chin so your head slides back and your chin slightly lowers. Continue to look straight ahead.  You should feel a gentle stretch in the back of your head. Be certain not to feel an aggressive stretch since this can cause headaches later.  Hold for 15-20 seconds. Repeat 2-3 times. Complete this exercise 1 time per day.  STRETCH - Cervical Side Bend   Stand or sit on a firm surface. Assume a good posture: chest up, shoulders drawn back, abdominal muscles slightly tense, knees unlocked (if standing) and feet hip width apart.  Without letting your nose or shoulders move, slowly tip your right / left ear to your shoulder until your feel a gentle stretch in the muscles on the opposite side of your neck.  Hold 15-20 seconds. Repeat 2-3 times. Complete this exercise 1-2 times per day.  STRETCH - Cervical Rotators   Stand or sit on a firm surface. Assume a good posture: chest up, shoulders drawn back, abdominal muscles slightly tense, knees unlocked (if standing) and feet hip width apart.  Keeping your eyes level with the ground, slowly turn your head until you feel a gentle stretch along the back and opposite side of your neck.  Hold 15-20 seconds. Repeat 2-3 times. Complete this exercise 1-2 times per day.  RANGE OF MOTION - Neck Circles    Stand or sit on a firm surface. Assume a good posture: chest up, shoulders drawn back, abdominal muscles slightly tense, knees unlocked (if standing) and feet hip width apart.  Gently roll your head down and around from the back of one shoulder to the back of the other. The motion should never be forced or painful.  Repeat the motion 10-20 times, or until you feel the neck muscles relax and loosen. Repeat 2-3 times. Complete the exercise 1-2 times per day. STRENGTHENING EXERCISES - Cervical Strain and Sprain These exercises may help you when beginning to rehabilitate your injury. They may resolve your symptoms with or without further involvement from your physician, physical therapist, or athletic trainer. While completing these exercises, remember:   Muscles can gain both the endurance and the strength needed for everyday activities through controlled exercises.  Complete these exercises as instructed by your physician,  physical therapist, or athletic trainer. Progress the resistance and repetitions only as guided.  You may experience muscle soreness or fatigue, but the pain or discomfort you are trying to eliminate should never worsen during these exercises. If this pain does worsen, stop and make certain you are following the directions exactly. If the pain is still present after adjustments, discontinue the exercise until you can discuss the trouble with your clinician.  STRENGTH - Cervical Flexors, Isometric  Face a wall, standing about 6 inches away. Place a small pillow, a ball about 6-8 inches in diameter, or a folded towel between your forehead and the wall.  Slightly tuck your chin and gently push your forehead into the soft object. Push only with mild to moderate intensity, building up tension gradually. Keep your jaw and forehead relaxed.  Hold 10 to 20 seconds. Keep your breathing relaxed.  Release the tension slowly. Relax your neck muscles completely before you start the next  repetition. Repeat 2-3 times. Complete this exercise 1 time per day.  STRENGTH- Cervical Lateral Flexors, Isometric   Stand about 6 inches away from a wall. Place a small pillow, a ball about 6-8 inches in diameter, or a folded towel between the side of your head and the wall.  Slightly tuck your chin and gently tilt your head into the soft object. Push only with mild to moderate intensity, building up tension gradually. Keep your jaw and forehead relaxed.  Hold 10 to 20 seconds. Keep your breathing relaxed.  Release the tension slowly. Relax your neck muscles completely before you start the next repetition. Repeat 2-3 times. Complete this exercise 1 time per day.  STRENGTH - Cervical Extensors, Isometric   Stand about 6 inches away from a wall. Place a small pillow, a ball about 6-8 inches in diameter, or a folded towel between the back of your head and the wall.  Slightly tuck your chin and gently tilt your head back into the soft object. Push only with mild to moderate intensity, building up tension gradually. Keep your jaw and forehead relaxed.  Hold 10 to 20 seconds. Keep your breathing relaxed.  Release the tension slowly. Relax your neck muscles completely before you start the next repetition. Repeat 2-3 times. Complete this exercise 1 time per day.  POSTURE AND BODY MECHANICS CONSIDERATIONS Keeping correct posture when sitting, standing or completing your activities will reduce the stress put on different body tissues, allowing injured tissues a chance to heal and limiting painful experiences. The following are general guidelines for improved posture. Your physician or physical therapist will provide you with any instructions specific to your needs. While reading these guidelines, remember:  The exercises prescribed by your provider will help you have the flexibility and strength to maintain correct postures.  The correct posture provides the optimal environment for your joints  to work. All of your joints have less wear and tear when properly supported by a spine with good posture. This means you will experience a healthier, less painful body.  Correct posture must be practiced with all of your activities, especially prolonged sitting and standing. Correct posture is as important when doing repetitive low-stress activities (typing) as it is when doing a single heavy-load activity (lifting).  PROLONGED STANDING WHILE SLIGHTLY LEANING FORWARD When completing a task that requires you to lean forward while standing in one place for a long time, place either foot up on a stationary 2- to 4-inch high object to help maintain the best posture. When both feet  are on the ground, the low back tends to lose its slight inward curve. If this curve flattens (or becomes too large), then the back and your other joints will experience too much stress, fatigue more quickly, and can cause pain.   RESTING POSITIONS Consider which positions are most painful for you when choosing a resting position. If you have pain with flexion-based activities (sitting, bending, stooping, squatting), choose a position that allows you to rest in a less flexed posture. You would want to avoid curling into a fetal position on your side. If your pain worsens with extension-based activities (prolonged standing, working overhead), avoid resting in an extended position such as sleeping on your stomach. Most people will find more comfort when they rest with their spine in a more neutral position, neither too rounded nor too arched. Lying on a non-sagging bed on your side with a pillow between your knees, or on your back with a pillow under your knees will often provide some relief. Keep in mind, being in any one position for a prolonged period of time, no matter how correct your posture, can still lead to stiffness.  WALKING Walk with an upright posture. Your ears, shoulders, and hips should all line up. OFFICE WORK When  working at a desk, create an environment that supports good, upright posture. Without extra support, muscles fatigue and lead to excessive strain on joints and other tissues.  CHAIR:  A chair should be able to slide under your desk when your back makes contact with the back of the chair. This allows you to work closely.  The chair's height should allow your eyes to be level with the upper part of your monitor and your hands to be slightly lower than your elbows.  Body position: ? Your feet should make contact with the floor. If this is not possible, use a foot rest. ? Keep your ears over your shoulders. This will reduce stress on your neck and low back.     ED Prescriptions    None     Controlled Substance Prescriptions [place] Controlled Substance Registry consulted? Not Applicable   Shelda Pal, DO 08/19/18 1145

## 2018-08-19 NOTE — ED Triage Notes (Signed)
Per pt he started having a headache yesterday that came on quick and then went away. Today he is having a slight tingle in the top of his head. This morning it was bad and eased up. Pt said slight diarrhea this morning. No fevers no congestion no chest no SOB

## 2018-08-19 NOTE — Discharge Instructions (Signed)
OK to take Tylenol 1000 mg (2 extra strength tabs) or 975 mg (3 regular strength tabs) every 6 hours as needed.  Heat (pad or rice pillow in microwave) over affected area, 10-15 minutes twice daily.   EXERCISES RANGE OF MOTION (ROM) AND STRETCHING EXERCISES  These exercises may help you when beginning to rehabilitate your issue. In order to successfully resolve your symptoms, you must improve your posture. These exercises are designed to help reduce the forward-head and rounded-shoulder posture which contributes to this condition. Your symptoms may resolve with or without further involvement from your physician, physical therapist or athletic trainer. While completing these exercises, remember:  Restoring tissue flexibility helps normal motion to return to the joints. This allows healthier, less painful movement and activity. An effective stretch should be held for at least 20 seconds, although you may need to begin with shorter hold times for comfort. A stretch should never be painful. You should only feel a gentle lengthening or release in the stretched tissue. Do not do any stretch or exercise that you cannot tolerate.  STRETCH- Axial Extensors Lie on your back on the floor. You may bend your knees for comfort. Place a rolled-up hand towel or dish towel, about 2 inches in diameter, under the part of your head that makes contact with the floor. Gently tuck your chin, as if trying to make a "double chin," until you feel a gentle stretch at the base of your head. Hold 15-20 seconds. Repeat 2-3 times. Complete this exercise 1 time per day.   STRETCH - Axial Extension  Stand or sit on a firm surface. Assume a good posture: chest up, shoulders drawn back, abdominal muscles slightly tense, knees unlocked (if standing) and feet hip width apart. Slowly retract your chin so your head slides back and your chin slightly lowers. Continue to look straight ahead. You should feel a gentle stretch in the back  of your head. Be certain not to feel an aggressive stretch since this can cause headaches later. Hold for 15-20 seconds. Repeat 2-3 times. Complete this exercise 1 time per day.  STRETCH - Cervical Side Bend  Stand or sit on a firm surface. Assume a good posture: chest up, shoulders drawn back, abdominal muscles slightly tense, knees unlocked (if standing) and feet hip width apart. Without letting your nose or shoulders move, slowly tip your right / left ear to your shoulder until your feel a gentle stretch in the muscles on the opposite side of your neck. Hold 15-20 seconds. Repeat 2-3 times. Complete this exercise 1-2 times per day.  STRETCH - Cervical Rotators  Stand or sit on a firm surface. Assume a good posture: chest up, shoulders drawn back, abdominal muscles slightly tense, knees unlocked (if standing) and feet hip width apart. Keeping your eyes level with the ground, slowly turn your head until you feel a gentle stretch along the back and opposite side of your neck. Hold 15-20 seconds. Repeat 2-3 times. Complete this exercise 1-2 times per day.  RANGE OF MOTION - Neck Circles  Stand or sit on a firm surface. Assume a good posture: chest up, shoulders drawn back, abdominal muscles slightly tense, knees unlocked (if standing) and feet hip width apart. Gently roll your head down and around from the back of one shoulder to the back of the other. The motion should never be forced or painful. Repeat the motion 10-20 times, or until you feel the neck muscles relax and loosen. Repeat 2-3 times. Complete the exercise  1-2 times per day. STRENGTHENING EXERCISES - Cervical Strain and Sprain These exercises may help you when beginning to rehabilitate your injury. They may resolve your symptoms with or without further involvement from your physician, physical therapist, or athletic trainer. While completing these exercises, remember:  Muscles can gain both the endurance and the strength needed for  everyday activities through controlled exercises. Complete these exercises as instructed by your physician, physical therapist, or athletic trainer. Progress the resistance and repetitions only as guided. You may experience muscle soreness or fatigue, but the pain or discomfort you are trying to eliminate should never worsen during these exercises. If this pain does worsen, stop and make certain you are following the directions exactly. If the pain is still present after adjustments, discontinue the exercise until you can discuss the trouble with your clinician.  STRENGTH - Cervical Flexors, Isometric Face a wall, standing about 6 inches away. Place a small pillow, a ball about 6-8 inches in diameter, or a folded towel between your forehead and the wall. Slightly tuck your chin and gently push your forehead into the soft object. Push only with mild to moderate intensity, building up tension gradually. Keep your jaw and forehead relaxed. Hold 10 to 20 seconds. Keep your breathing relaxed. Release the tension slowly. Relax your neck muscles completely before you start the next repetition. Repeat 2-3 times. Complete this exercise 1 time per day.  STRENGTH- Cervical Lateral Flexors, Isometric  Stand about 6 inches away from a wall. Place a small pillow, a ball about 6-8 inches in diameter, or a folded towel between the side of your head and the wall. Slightly tuck your chin and gently tilt your head into the soft object. Push only with mild to moderate intensity, building up tension gradually. Keep your jaw and forehead relaxed. Hold 10 to 20 seconds. Keep your breathing relaxed. Release the tension slowly. Relax your neck muscles completely before you start the next repetition. Repeat 2-3 times. Complete this exercise 1 time per day.  STRENGTH - Cervical Extensors, Isometric  Stand about 6 inches away from a wall. Place a small pillow, a ball about 6-8 inches in diameter, or a folded towel between  the back of your head and the wall. Slightly tuck your chin and gently tilt your head back into the soft object. Push only with mild to moderate intensity, building up tension gradually. Keep your jaw and forehead relaxed. Hold 10 to 20 seconds. Keep your breathing relaxed. Release the tension slowly. Relax your neck muscles completely before you start the next repetition. Repeat 2-3 times. Complete this exercise 1 time per day.  POSTURE AND BODY MECHANICS CONSIDERATIONS Keeping correct posture when sitting, standing or completing your activities will reduce the stress put on different body tissues, allowing injured tissues a chance to heal and limiting painful experiences. The following are general guidelines for improved posture. Your physician or physical therapist will provide you with any instructions specific to your needs. While reading these guidelines, remember: The exercises prescribed by your provider will help you have the flexibility and strength to maintain correct postures. The correct posture provides the optimal environment for your joints to work. All of your joints have less wear and tear when properly supported by a spine with good posture. This means you will experience a healthier, less painful body. Correct posture must be practiced with all of your activities, especially prolonged sitting and standing. Correct posture is as important when doing repetitive low-stress activities (typing) as it is  when doing a single heavy-load activity (lifting).  PROLONGED STANDING WHILE SLIGHTLY LEANING FORWARD When completing a task that requires you to lean forward while standing in one place for a long time, place either foot up on a stationary 2- to 4-inch high object to help maintain the best posture. When both feet are on the ground, the low back tends to lose its slight inward curve. If this curve flattens (or becomes too large), then the back and your other joints will experience too much  stress, fatigue more quickly, and can cause pain.   RESTING POSITIONS Consider which positions are most painful for you when choosing a resting position. If you have pain with flexion-based activities (sitting, bending, stooping, squatting), choose a position that allows you to rest in a less flexed posture. You would want to avoid curling into a fetal position on your side. If your pain worsens with extension-based activities (prolonged standing, working overhead), avoid resting in an extended position such as sleeping on your stomach. Most people will find more comfort when they rest with their spine in a more neutral position, neither too rounded nor too arched. Lying on a non-sagging bed on your side with a pillow between your knees, or on your back with a pillow under your knees will often provide some relief. Keep in mind, being in any one position for a prolonged period of time, no matter how correct your posture, can still lead to stiffness.  WALKING Walk with an upright posture. Your ears, shoulders, and hips should all line up. OFFICE WORK When working at a desk, create an environment that supports good, upright posture. Without extra support, muscles fatigue and lead to excessive strain on joints and other tissues.  CHAIR: A chair should be able to slide under your desk when your back makes contact with the back of the chair. This allows you to work closely. The chair's height should allow your eyes to be level with the upper part of your monitor and your hands to be slightly lower than your elbows. Body position: Your feet should make contact with the floor. If this is not possible, use a foot rest. Keep your ears over your shoulders. This will reduce stress on your neck and low back.

## 2018-08-21 DIAGNOSIS — R51 Headache: Secondary | ICD-10-CM | POA: Diagnosis not present

## 2018-08-21 DIAGNOSIS — M542 Cervicalgia: Secondary | ICD-10-CM | POA: Diagnosis not present

## 2018-08-21 DIAGNOSIS — M791 Myalgia, unspecified site: Secondary | ICD-10-CM | POA: Diagnosis not present

## 2018-08-21 DIAGNOSIS — G518 Other disorders of facial nerve: Secondary | ICD-10-CM | POA: Diagnosis not present

## 2018-08-28 ENCOUNTER — Emergency Department (HOSPITAL_COMMUNITY): Payer: Medicare HMO

## 2018-08-28 ENCOUNTER — Emergency Department (HOSPITAL_COMMUNITY)
Admission: EM | Admit: 2018-08-28 | Discharge: 2018-08-29 | Disposition: A | Discharge status: Home / Self Care | Payer: Medicare HMO | Attending: Emergency Medicine | Admitting: Emergency Medicine

## 2018-08-28 ENCOUNTER — Other Ambulatory Visit: Payer: Self-pay

## 2018-08-28 DIAGNOSIS — R51 Headache: Secondary | ICD-10-CM | POA: Diagnosis not present

## 2018-08-28 DIAGNOSIS — I129 Hypertensive chronic kidney disease with stage 1 through stage 4 chronic kidney disease, or unspecified chronic kidney disease: Secondary | ICD-10-CM | POA: Diagnosis not present

## 2018-08-28 DIAGNOSIS — Z7984 Long term (current) use of oral hypoglycemic drugs: Secondary | ICD-10-CM | POA: Insufficient documentation

## 2018-08-28 DIAGNOSIS — Z7982 Long term (current) use of aspirin: Secondary | ICD-10-CM | POA: Diagnosis not present

## 2018-08-28 DIAGNOSIS — J449 Chronic obstructive pulmonary disease, unspecified: Secondary | ICD-10-CM | POA: Insufficient documentation

## 2018-08-28 DIAGNOSIS — Z20828 Contact with and (suspected) exposure to other viral communicable diseases: Secondary | ICD-10-CM | POA: Diagnosis not present

## 2018-08-28 DIAGNOSIS — Z1159 Encounter for screening for other viral diseases: Secondary | ICD-10-CM | POA: Diagnosis not present

## 2018-08-28 DIAGNOSIS — Z86718 Personal history of other venous thrombosis and embolism: Secondary | ICD-10-CM | POA: Insufficient documentation

## 2018-08-28 DIAGNOSIS — G9389 Other specified disorders of brain: Secondary | ICD-10-CM | POA: Insufficient documentation

## 2018-08-28 DIAGNOSIS — Z79899 Other long term (current) drug therapy: Secondary | ICD-10-CM | POA: Insufficient documentation

## 2018-08-28 DIAGNOSIS — Z8546 Personal history of malignant neoplasm of prostate: Secondary | ICD-10-CM | POA: Diagnosis not present

## 2018-08-28 DIAGNOSIS — Z87891 Personal history of nicotine dependence: Secondary | ICD-10-CM | POA: Insufficient documentation

## 2018-08-28 DIAGNOSIS — G4489 Other headache syndrome: Secondary | ICD-10-CM | POA: Insufficient documentation

## 2018-08-28 DIAGNOSIS — E1122 Type 2 diabetes mellitus with diabetic chronic kidney disease: Secondary | ICD-10-CM | POA: Diagnosis not present

## 2018-08-28 DIAGNOSIS — I2510 Atherosclerotic heart disease of native coronary artery without angina pectoris: Secondary | ICD-10-CM | POA: Insufficient documentation

## 2018-08-28 DIAGNOSIS — I671 Cerebral aneurysm, nonruptured: Secondary | ICD-10-CM | POA: Diagnosis not present

## 2018-08-28 DIAGNOSIS — N182 Chronic kidney disease, stage 2 (mild): Secondary | ICD-10-CM | POA: Diagnosis not present

## 2018-08-28 LAB — DIFFERENTIAL
Abs Immature Granulocytes: 0.05 10*3/uL (ref 0.00–0.07)
Basophils Absolute: 0 10*3/uL (ref 0.0–0.1)
Basophils Relative: 1 %
Eosinophils Absolute: 0.3 10*3/uL (ref 0.0–0.5)
Eosinophils Relative: 4 %
Immature Granulocytes: 1 %
Lymphocytes Relative: 16 %
Lymphs Abs: 1 10*3/uL (ref 0.7–4.0)
Monocytes Absolute: 0.5 10*3/uL (ref 0.1–1.0)
Monocytes Relative: 7 %
Neutro Abs: 4.4 10*3/uL (ref 1.7–7.7)
Neutrophils Relative %: 71 %

## 2018-08-28 LAB — CBC
HCT: 34.2 % — ABNORMAL LOW (ref 39.0–52.0)
Hemoglobin: 10.2 g/dL — ABNORMAL LOW (ref 13.0–17.0)
MCH: 21.3 pg — ABNORMAL LOW (ref 26.0–34.0)
MCHC: 29.8 g/dL — ABNORMAL LOW (ref 30.0–36.0)
MCV: 71.4 fL — ABNORMAL LOW (ref 80.0–100.0)
Platelets: 189 10*3/uL (ref 150–400)
RBC: 4.79 MIL/uL (ref 4.22–5.81)
RDW: 17.2 % — ABNORMAL HIGH (ref 11.5–15.5)
WBC: 6.2 10*3/uL (ref 4.0–10.5)
nRBC: 0 % (ref 0.0–0.2)

## 2018-08-28 LAB — COMPREHENSIVE METABOLIC PANEL
ALT: 17 U/L (ref 0–44)
AST: 15 U/L (ref 15–41)
Albumin: 3.8 g/dL (ref 3.5–5.0)
Alkaline Phosphatase: 77 U/L (ref 38–126)
Anion gap: 11 (ref 5–15)
BUN: 16 mg/dL (ref 8–23)
CO2: 24 mmol/L (ref 22–32)
Calcium: 9.3 mg/dL (ref 8.9–10.3)
Chloride: 105 mmol/L (ref 98–111)
Creatinine, Ser: 1.2 mg/dL (ref 0.61–1.24)
GFR calc Af Amer: >60 mL/min (ref >60)
GFR calc non Af Amer: 58 mL/min — ABNORMAL LOW (ref >60)
Glucose, Bld: 141 mg/dL — ABNORMAL HIGH (ref 70–99)
Potassium: 4 mmol/L (ref 3.5–5.1)
Sodium: 140 mmol/L (ref 135–145)
Total Bilirubin: 0.3 mg/dL (ref 0.3–1.2)
Total Protein: 6.8 g/dL (ref 6.5–8.1)

## 2018-08-28 LAB — PROTIME-INR
INR: 1 (ref 0.8–1.2)
Prothrombin Time: 12.8 seconds (ref 11.4–15.2)

## 2018-08-28 LAB — C-REACTIVE PROTEIN: CRP: 2.4 mg/dL — ABNORMAL HIGH (ref <1.0)

## 2018-08-28 LAB — APTT: aPTT: 35 seconds (ref 24–36)

## 2018-08-28 MED ORDER — METOCLOPRAMIDE HCL 5 MG/ML IJ SOLN
10.0000 mg | Freq: Once | INTRAMUSCULAR | Status: AC
  Administered 2018-08-28: 22:00:00 10 mg via INTRAVENOUS
  Filled 2018-08-28: qty 2

## 2018-08-28 NOTE — ED Triage Notes (Signed)
Pt reports continued intermittent headaches that has been going on for 3 weeks. Pt seen at [facility] and they gave him medications that only last for a few days but the pain comes back. Pt a.o

## 2018-08-28 NOTE — ED Notes (Signed)
Patient transported to MRI 

## 2018-08-29 DIAGNOSIS — I671 Cerebral aneurysm, nonruptured: Secondary | ICD-10-CM | POA: Diagnosis not present

## 2018-08-29 DIAGNOSIS — R51 Headache: Secondary | ICD-10-CM | POA: Diagnosis not present

## 2018-08-29 LAB — SEDIMENTATION RATE: Sed Rate: 33 mm/hr — ABNORMAL HIGH (ref 0–16)

## 2018-08-29 LAB — SARS CORONAVIRUS 2 BY RT PCR (HOSPITAL ORDER, PERFORMED IN ~~LOC~~ HOSPITAL LAB): SARS Coronavirus 2: NEGATIVE

## 2018-08-29 NOTE — ED Provider Notes (Addendum)
[facility] EMERGENCY DEPARTMENT Provider Note   CSN: [id number] Arrival date & time: 08/28/18  1658    History   Chief Complaint Chief Complaint  Patient presents with  . Headache    HPI Joel [name] is a 78 y.o. male.     HPI  78 year old male comes in with chief complaint of headache. He has history of CAD, prostate cancer that is being treated with oral chemo, diabetes, prior history of DVT not on any blood thinners.  He states for the last 1 month he has had intermittent episodes of headaches.  The headaches when they arrived are located on the frontal aspect and top of his head.  He describes the headaches as constant and dull.  There is no specific evoking, aggravating or relieving factors.  There is no associated neurologic symptoms such as focal numbness, weakness, slurred speech, vision changes and he also denies any jaw claudication.  He reports that when he gets the headaches they last for several hours.  He has gone to the urgent care on 2 occasions and to a headache specialist on one occasion.  Each times he was given some medication through the IV or an injection to his neck -and his symptoms improved transiently.  Past Medical History:  Diagnosis Date  . Atypical chest pain   . CAD (coronary artery disease)    a. Multiple prior evaluations then 05/2012 s/p BMS to OM2. b. Nuc 11/2012 wnl. c. Cath 2015: patent stent, moderate LAD/RCA dz, treated medically.  . Cancer ([place])    Hx: of prostate  . Cataract   . CKD (chronic kidney disease), stage II   . COPD (chronic obstructive pulmonary disease) ([place])   . Cough secondary to angiotensin converting enzyme inhibitor (ACE-I)   . Diabetes mellitus without complication ([place])   . Diverticulosis   . DVT (deep venous thrombosis) ([place])   . Family history of breast cancer in first degree relative   . GERD (gastroesophageal reflux disease)   . Headache(784.0)    Hx: of  . History of blood transfusion     "I've had 2; don't know what it was related to" (11/09/2012)  . HLD (hyperlipidemia)   . HTN (hypertension)   . Iron deficiency anemia   . Microcytic anemia   . Peripheral vascular disease ([place])    a. L pop-tibial bypass 2011, followed by [facility].  . Personal history of prostate cancer   . Rotator cuff injury    "left arm; never repaired" (11/09/2012)    Patient Active Problem List   Diagnosis Date Noted  . Seasonal allergies 07/04/2018  . Chronic obstructive pulmonary disease ([place]) 07/04/2018  . Dizziness 01/20/2018  . Malaise 01/20/2018  . Decreased breath sounds 01/20/2018  . Needs flu shot 01/20/2018  . Chronic nonintractable headache 12/07/2017  . Back pain 11/01/2017  . Radicular pain of lower extremity 11/01/2017  . Prostate cancer ([place]) 11/01/2017  . Family history of breast cancer in first degree relative   . Personal history of prostate cancer   . Elliptocytosis on peripheral blood smear ([place]) 09/13/2016  . Prediabetes 03/22/2016  . PVD (peripheral vascular disease) with claudication ([place]) 11/15/2012  . Chest pain, localized 11/10/2012  . Hypokalemia 11/10/2012  . Bradycardia 11/09/2012  . Thrombocytopenia ([place]) 06/01/2012  . Microcytic anemia   . Atherosclerosis of native arteries of the extremities with intermittent claudication 06/16/2011  . Stable angina ([place]) 06/11/2011  . CAD (coronary artery disease)   . Chest pain, atypical  05/30/2011  . Iron deficiency anemia 10/03/2009  . DIVERTICULOSIS OF COLON 10/03/2009  . WEIGHT LOSS, ABNORMAL 10/03/2009  . CHEST PAIN UNSPECIFIED 09/13/2008  . Headache(784.0) 08/19/2008  . TIA 06/05/2008  . SHOULDER PAIN, LEFT 10/11/2007  . COUGH DUE TO ACE INHIBITORS 05/07/2007  . History of tobacco use disorder 04/25/2007  . Hyperlipidemia 04/21/2007  . Essential hypertension 04/21/2007  . GERD 04/21/2007    Past Surgical History:  Procedure Laterality Date  . CARDIOVASCULAR STRESS TEST  Aug. 2014  . CATARACT EXTRACTION  W/ INTRAOCULAR LENS IMPLANT Right ~ 2008  . CIRCUMCISION    . COLONOSCOPY    . CORONARY ANGIOPLASTY WITH STENT PLACEMENT  2014   "1" (11/09/2012)  . ILIAC ARTERY ANEURYSM REPAIR    . LEFT HEART CATH  05-31-12  . LEFT HEART CATHETERIZATION WITH CORONARY ANGIOGRAM N/A 06/01/2011   Procedure: LEFT HEART CATHETERIZATION WITH CORONARY ANGIOGRAM;  Surgeon: Hillary Bow, MD;  Location: [facility] CATH LAB;  Service: Cardiovascular;  Laterality: N/A;  . LEFT HEART CATHETERIZATION WITH CORONARY ANGIOGRAM N/A 05/31/2012   Procedure: LEFT HEART CATHETERIZATION WITH CORONARY ANGIOGRAM;  Surgeon: Peter M Martinique, MD;  Location: [facility] CATH LAB;  Service: Cardiovascular;  Laterality: N/A;  . LEFT HEART CATHETERIZATION WITH CORONARY ANGIOGRAM N/A 08/01/2013   Procedure: LEFT HEART CATHETERIZATION WITH CORONARY ANGIOGRAM;  Surgeon: Burnell Blanks, MD;  Location[facility] CATH LAB;  Service: Cardiovascular;  Laterality: N/A;  . PERCUTANEOUS CORONARY STENT INTERVENTION (PCI-S)  05/31/2012   Procedure: PERCUTANEOUS CORONARY STENT INTERVENTION (PCI-S);  Surgeon: Peter M Martinique, MD;  Location: [facility] CATH LAB;  Service: Cardiovascular;;  . PR VEIN BYPASS GRAFT,AORTO-FEM-POP Left 10/01/2009   left below knee popliteal artery to posterior tibial artery  . SHOULDER ARTHROSCOPY WITH OPEN ROTATOR CUFF REPAIR AND DISTAL CLAVICLE ACROMINECTOMY Left 01/12/2013   Procedure: LEFT SHOULDER ARTHROSCOPY WITH DEBRIDEMENT OPEN DISTAL CLAVICLE RESECTION ,acromioplastyAND ROTATOR CUFF REPAIR;  Surgeon: Yvette Rack., MD;  Location: [facility];  Service: Orthopedics;  Laterality: Left;        Home Medications    Prior to Admission medications   Medication Sig Start Date End Date Taking? Authorizing Provider  abiraterone Acetate (ZYTIGA) 250 MG tablet Take 1,000 mg by mouth daily before breakfast. Take on an empty stomach 1 hour before or 2 hours after a meal     [provider]  albuterol (PROVENTIL HFA;VENTOLIN HFA) 108 (90 Base) MCG/ACT  inhaler Inhale 2 puffs into the lungs every 6 (six) hours as needed for wheezing or shortness of breath. 07/04/18   Henson, Vickie L, NP-C  amLODipine (NORVASC) 10 MG tablet TAKE 1 TABLET BY MOUTH EVERY DAY 08/10/18   Henson, Vickie L, NP-C  aspirin EC 81 MG tablet Take 81 mg by mouth daily.     [provider]  atorvastatin (LIPITOR) 40 MG tablet Take 1 tablet (40 mg total) by mouth daily. 04/27/18 07/31/18  Lyda Jester M, PA-C  brimonidine (ALPHAGAN) 0.2 % ophthalmic solution Place 1 drop into the right eye 2 (two) times daily.  09/13/17   [provider]  diclofenac sodium (VOLTAREN) 1 % GEL Apply 2 g topically 4 (four) times daily.    [provider]  ferrous sulfate 325 (65 FE) MG tablet Take 325 mg by mouth daily with breakfast.     [provider]  fluticasone (FLONASE) 50 MCG/ACT nasal spray SHAKE LIQUID AND USE 2 SPRAYS IN EACH NOSTRIL DAILY 07/31/18   Henson, Vickie L, NP-C  gabapentin (NEURONTIN) 600 MG  tablet Take 600 mg by mouth daily.    [provider]  isosorbide mononitrate (IMDUR) 30 MG 24 hr tablet TAKE 1 TABLET(30 MG) BY MOUTH DAILY 05/08/18   Burnell Blanks, MD  loratadine (CLARITIN) 10 MG tablet TAKE 1 TABLET(10 MG) BY MOUTH DAILY 07/31/18   Henson, Vickie L, NP-C  losartan-hydrochlorothiazide (HYZAAR) 100-25 MG tablet TAKE 1 TABLET BY MOUTH DAILY 07/24/18   Henson, Vickie L, NP-C  metFORMIN (GLUCOPHAGE) 500 MG tablet Take 500 mg by mouth 2 (two) times daily with a meal. Reported on 08/27/2015    [provider]  nitroGLYCERIN (NITROSTAT) 0.4 MG SL tablet Place 1 tablet (0.4 mg total) under the tongue every 5 (five) minutes as needed for chest pain. 04/27/18   Lyda Jester M, PA-C  omeprazole (PRILOSEC) 20 MG capsule Take 20 mg by mouth 2 (two) times daily before a meal.    [provider]  predniSONE (DELTASONE) 5 MG tablet Take 5 mg by mouth daily before breakfast.     [provider]  tamsulosin  (FLOMAX) 0.4 MG CAPS capsule Take 0.4 mg at bedtime by mouth.    [provider]  traMADol (ULTRAM) 50 MG tablet Take 1 tablet (50 mg total) by mouth every 8 (eight) hours as needed for severe pain. 02/15/18   Girtha Rm, NP-C    Family History Family History  Problem Relation Age of Onset  . Breast cancer Mother        dx 66s  . Hyperlipidemia Mother   . Hypertension Mother   . COPD Father        deceased  . Hyperlipidemia Father   . Hypertension Father   . Hypertension Sister   . Cancer Brother 69       Unknown cancer, possibly stomach  . Hypertension Brother   . Cancer Brother 48       Unknown, possibly lung  . Hypertension Daughter   . Hypertension Son   . Colon cancer Neg Hx        pt is unclear about family hx    Social History Social History   Tobacco Use  . Smoking status: Former Smoker    Packs/day: 0.00    Types: Cigarettes    Last attempt to quit: 05/26/2011    Years since quitting: 7.2  . Smokeless tobacco: Never Used  Substance Use Topics  . Alcohol use: No  . Drug use: No     Allergies   Topiramate; Atenolol; Lisinopril; and Tramadol-acetaminophen   Review of Systems Review of Systems  Constitutional: Positive for activity change.  Neurological: Positive for headaches.  All other systems reviewed and are negative.    Physical Exam Updated Vital Signs BP (!) 145/69   Pulse (!) 59   Temp 98.6 F (37 C) (Oral)   Resp 16   SpO2 97%   Physical Exam Vitals signs and nursing note reviewed.  Constitutional:      Appearance: He is well-developed.  HENT:     Head: Normocephalic and atraumatic.  Eyes:     Pupils: Pupils are equal, round, and reactive to light.  Neck:     Musculoskeletal: Normal range of motion and neck supple.     Vascular: No JVD.  Cardiovascular:     Rate and Rhythm: Normal rate and regular rhythm.  Pulmonary:     Effort: Pulmonary effort is normal. No respiratory distress.     Breath sounds: Normal  breath sounds.  Abdominal:  General: Bowel sounds are normal.     Palpations: Abdomen is soft.     Tenderness: There is no abdominal tenderness. There is no guarding or rebound.  Skin:    General: Skin is warm and dry.  Neurological:     Mental Status: He is alert and oriented to person, place, and time. Mental status is at baseline.     GCS: GCS eye subscore is 4. GCS verbal subscore is 5. GCS motor subscore is 6.     Cranial Nerves: No cranial nerve deficit.     Sensory: No sensory deficit.     Motor: No weakness.     Coordination: Coordination normal.     Comments: NIHSS - 0 No objective sensory deficits, Motor strength upper and lower extremity 4+ and equal Normal cerebellar exam      ED Treatments / Results  Labs (all labs ordered are listed, but only abnormal results are displayed) Labs Reviewed  CBC - Abnormal; Notable for the following components:      Result Value   Hemoglobin 10.2 (*)    HCT 34.2 (*)    MCV 71.4 (*)    MCH 21.3 (*)    MCHC 29.8 (*)    RDW 17.2 (*)    All other components within normal limits  COMPREHENSIVE METABOLIC PANEL - Abnormal; Notable for the following components:   Glucose, Bld 141 (*)    GFR calc non Af Amer 58 (*)    All other components within normal limits  C-REACTIVE PROTEIN - Abnormal; Notable for the following components:   CRP 2.4 (*)    All other components within normal limits  SARS CORONAVIRUS 2 (HOSPITAL ORDER, [facility])  PROTIME-INR  APTT  DIFFERENTIAL  SEDIMENTATION RATE    EKG None  Radiology Ct Head Wo Contrast  Result Date: 08/28/2018 CLINICAL DATA:  Intermittent headache for the past 3 weeks. EXAM: CT HEAD WITHOUT CONTRAST TECHNIQUE: Contiguous axial images were obtained from the base of the skull through the vertex without intravenous contrast. COMPARISON:  CT head dated December 11, 2017. FINDINGS: Brain: No evidence of acute infarction, hemorrhage, hydrocephalus, extra-axial  collection or mass lesion/mass effect. 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 Vascular: Atherosclerotic vascular calcification of the carotid siphons. No hyperdense vessel. Skull: Normal. Negative for fracture or focal lesion. Sinuses/Orbits: No acute finding. Similar-appearing mucosal thickening involving the bilateral ethmoid air cells and right frontal sinus. Other: None. IMPRESSION: 1.  No acute intracranial abnormality. 2. 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 Electronically Signed   By: Titus Dubin M.D.   On: 08/28/2018 19:15    Procedures Procedures (including critical care time)  Medications Ordered in ED Medications  metoCLOPramide (REGLAN) injection 10 mg (10 mg Intravenous Given 08/28/18 2228)     Initial Impression / Assessment and Plan / ED Course  I have reviewed the triage vital signs and the nursing notes.  Pertinent labs & imaging results that were available during my care of the patient were reviewed by me and considered in my medical decision making (see chart for details).        DDX includes: Primary headaches - including migrainous headaches, cluster headaches, tension headaches. ICH Cavernous sinus thrombosis Encephalitis Sinusitis Tumor Vascular headaches AV malformation Muscular headaches  A/P: Pt comes in with cc of headaches. Headaches are 7/10.  There is no associated neck pain, neurologic symptoms.  The headaches have been coming and going, with no specific evoking, aggravating  or relieving factors.  Based on exam it does not appear that patient has any dissection/infection/intracranial bleed.  CT scan of the brain does not show any tumors.  The other possibility includes occult tumor that is missed on the CAT scan along with cerebral venous thrombosis.  Based on history, it does not seem like he has a large aneurysm.   Plan is to get MRI brain and MRV to rule out any tumors, occult bleed, thrombosis. Dr. Dina Rich  to follow-up on the study.  Clinically we do not think he has temporal arteritis.  Sed rate and CRP ordered.  Final Clinical Impressions(s) / ED Diagnoses   Final diagnoses:  Other headache syndrome    ED Discharge Orders    None       Varney Biles, MD 08/29/18 0040    Varney Biles, MD 08/29/18 [phone]

## 2018-08-29 NOTE — ED Notes (Signed)
Patient verbalizes understanding of discharge instructions. Opportunity for questioning and answers were provided. Armband removed by staff, pt discharged from ED ambulatory and drove self home

## 2018-08-29 NOTE — ED Provider Notes (Signed)
Patient signed out pending MRI.  MRI reviewed.  No acute changes.  Chronic changes noted.  Patient was updated.  Plan for discharge with outpatient follow-up with his headache specialist.  Patient is agreeable to plan.  Patient discharged per Dr. Andree Elk discharge instructions.   Merryl Hacker, MD 08/29/18 0130

## 2018-08-29 NOTE — Discharge Instructions (Signed)
We saw you in the ER for headaches. All the labs and imaging are normal. We are not sure what is causing your headaches, however, there appears to be no evidence of infection, bleeds or tumors based on our exam and results.  Please take TYLENOL round the clock for the next 3 days.  See the headache specialist as planned.

## 2018-08-30 DIAGNOSIS — M549 Dorsalgia, unspecified: Secondary | ICD-10-CM | POA: Diagnosis not present

## 2018-08-30 DIAGNOSIS — M4716 Other spondylosis with myelopathy, lumbar region: Secondary | ICD-10-CM | POA: Diagnosis not present

## 2018-08-30 DIAGNOSIS — M545 Low back pain: Secondary | ICD-10-CM | POA: Diagnosis not present

## 2018-09-06 ENCOUNTER — Other Ambulatory Visit: Payer: Self-pay | Admitting: Orthopaedic Surgery

## 2018-09-06 DIAGNOSIS — M545 Low back pain, unspecified: Secondary | ICD-10-CM

## 2018-09-06 DIAGNOSIS — R51 Headache: Secondary | ICD-10-CM | POA: Diagnosis not present

## 2018-09-06 DIAGNOSIS — G518 Other disorders of facial nerve: Secondary | ICD-10-CM | POA: Diagnosis not present

## 2018-09-06 DIAGNOSIS — M791 Myalgia, unspecified site: Secondary | ICD-10-CM | POA: Diagnosis not present

## 2018-09-06 DIAGNOSIS — M542 Cervicalgia: Secondary | ICD-10-CM | POA: Diagnosis not present

## 2018-09-07 ENCOUNTER — Telehealth: Payer: Self-pay | Admitting: *Deleted

## 2018-09-07 ENCOUNTER — Other Ambulatory Visit: Payer: Self-pay | Admitting: Cardiovascular Disease

## 2018-09-07 DIAGNOSIS — E7800 Pure hypercholesterolemia, unspecified: Secondary | ICD-10-CM

## 2018-09-07 MED ORDER — ATORVASTATIN CALCIUM 40 MG PO TABS: 40.0000 mg | ORAL_TABLET | Freq: Every day | ORAL | 3 refills | Status: DC

## 2018-09-07 NOTE — Telephone Encounter (Signed)
Pt's medication was sent to pt's pharmacy as requested. Confirmation received.  °

## 2018-09-07 NOTE — Telephone Encounter (Signed)
Patient left Rx request on refill line for Pravastatin. Per epic note Atorvastatin. Per pharmacist, [place], [place] [phone], patient has a refill ready for Atorvastatin 40 mg. Pravastatin 40 mg was originally written 07/2017. Please advise preference for which medication for patient. Thank you.

## 2018-09-07 NOTE — Telephone Encounter (Signed)
Pt should be taking atorvastatin. This was started at office visit with B.Rosita Fire, [place] on January 16,2020.  It was listed as an active medication at 07/31/18 telemedicine visit with Dr. Angelena Form.  Pt was to have lab work checked 8 weeks after starting Atorvastatin.  Refill for atorvastatin has been sent to pharmacy today.  I spoke with pt who reports he did not start Atorvastatin and has been taking Pravastatin.  I gave him instructions to stop Pravastatin and start Atorvastatin 40 mg daily.  He will come in for fasting lipid and liver profiles on July 23,2020.

## 2018-09-08 ENCOUNTER — Encounter (HOSPITAL_COMMUNITY): Payer: Self-pay | Admitting: Emergency Medicine

## 2018-09-08 ENCOUNTER — Other Ambulatory Visit: Payer: Self-pay

## 2018-09-08 ENCOUNTER — Emergency Department (HOSPITAL_COMMUNITY)
Admission: EM | Admit: 2018-09-08 | Discharge: 2018-09-08 | Disposition: A | Discharge status: Home / Self Care | Payer: Medicare HMO | Attending: Emergency Medicine | Admitting: Emergency Medicine

## 2018-09-08 ENCOUNTER — Emergency Department (HOSPITAL_COMMUNITY): Payer: Medicare HMO

## 2018-09-08 DIAGNOSIS — I129 Hypertensive chronic kidney disease with stage 1 through stage 4 chronic kidney disease, or unspecified chronic kidney disease: Secondary | ICD-10-CM | POA: Diagnosis not present

## 2018-09-08 DIAGNOSIS — Z7982 Long term (current) use of aspirin: Secondary | ICD-10-CM | POA: Insufficient documentation

## 2018-09-08 DIAGNOSIS — R079 Chest pain, unspecified: Secondary | ICD-10-CM | POA: Diagnosis not present

## 2018-09-08 DIAGNOSIS — Z87891 Personal history of nicotine dependence: Secondary | ICD-10-CM | POA: Diagnosis not present

## 2018-09-08 DIAGNOSIS — Z8546 Personal history of malignant neoplasm of prostate: Secondary | ICD-10-CM | POA: Diagnosis not present

## 2018-09-08 DIAGNOSIS — Z79899 Other long term (current) drug therapy: Secondary | ICD-10-CM | POA: Diagnosis not present

## 2018-09-08 DIAGNOSIS — I2510 Atherosclerotic heart disease of native coronary artery without angina pectoris: Secondary | ICD-10-CM | POA: Diagnosis not present

## 2018-09-08 DIAGNOSIS — N182 Chronic kidney disease, stage 2 (mild): Secondary | ICD-10-CM | POA: Diagnosis not present

## 2018-09-08 DIAGNOSIS — E1122 Type 2 diabetes mellitus with diabetic chronic kidney disease: Secondary | ICD-10-CM | POA: Diagnosis not present

## 2018-09-08 DIAGNOSIS — R0789 Other chest pain: Secondary | ICD-10-CM | POA: Diagnosis not present

## 2018-09-08 DIAGNOSIS — J449 Chronic obstructive pulmonary disease, unspecified: Secondary | ICD-10-CM | POA: Insufficient documentation

## 2018-09-08 DIAGNOSIS — Z86718 Personal history of other venous thrombosis and embolism: Secondary | ICD-10-CM | POA: Insufficient documentation

## 2018-09-08 LAB — CBC
HCT: 33.5 % — ABNORMAL LOW (ref 39.0–52.0)
Hemoglobin: 9.9 g/dL — ABNORMAL LOW (ref 13.0–17.0)
MCH: 21.2 pg — ABNORMAL LOW (ref 26.0–34.0)
MCHC: 29.6 g/dL — ABNORMAL LOW (ref 30.0–36.0)
MCV: 71.7 fL — ABNORMAL LOW (ref 80.0–100.0)
Platelets: 198 10*3/uL (ref 150–400)
RBC: 4.67 MIL/uL (ref 4.22–5.81)
RDW: 17.3 % — ABNORMAL HIGH (ref 11.5–15.5)
WBC: 7.7 10*3/uL (ref 4.0–10.5)
nRBC: 0 % (ref 0.0–0.2)

## 2018-09-08 LAB — BASIC METABOLIC PANEL
Anion gap: 12 (ref 5–15)
BUN: 15 mg/dL (ref 8–23)
CO2: 23 mmol/L (ref 22–32)
Calcium: 9.6 mg/dL (ref 8.9–10.3)
Chloride: 106 mmol/L (ref 98–111)
Creatinine, Ser: 1.32 mg/dL — ABNORMAL HIGH (ref 0.61–1.24)
GFR calc Af Amer: 59 mL/min — ABNORMAL LOW (ref >60)
GFR calc non Af Amer: 51 mL/min — ABNORMAL LOW (ref >60)
Glucose, Bld: 119 mg/dL — ABNORMAL HIGH (ref 70–99)
Potassium: 3.8 mmol/L (ref 3.5–5.1)
Sodium: 141 mmol/L (ref 135–145)

## 2018-09-08 LAB — TROPONIN I
Troponin I: <0.03 ng/mL (ref <0.03)
Troponin I: <0.03 ng/mL (ref <0.03)

## 2018-09-08 MED ORDER — METOCLOPRAMIDE HCL 5 MG/ML IJ SOLN
10.0000 mg | Freq: Once | INTRAMUSCULAR | Status: AC
  Administered 2018-09-08: 20:00:00 10 mg via INTRAMUSCULAR
  Filled 2018-09-08: qty 2

## 2018-09-08 MED ORDER — METOCLOPRAMIDE HCL 5 MG/ML IJ SOLN: 10.0000 mg | Freq: Once | INTRAMUSCULAR | Status: DC

## 2018-09-08 MED ORDER — SODIUM CHLORIDE 0.9% FLUSH: 3.0000 mL | Freq: Once | INTRAVENOUS | Status: DC

## 2018-09-08 NOTE — Discharge Instructions (Addendum)
Read instructions below for reasons to return to the Emergency Department.   It is recommended that your follow up with your cardiologist  in regards to today's visit. We recommend you call your cardiologist first thing Monday morning and schedule an appointment to be seen within the week.  Tests performed today include: An EKG of your heart A chest x-ray Cardiac enzymes - a blood test for heart muscle damage Blood counts and electrolytes  Chest Pain (Nonspecific)  HOME CARE INSTRUCTIONS  -For the next few days, avoid physical activities that bring on chest pain. Continue physical activities as directed.  -Do not smoke cigarettes or drink alcohol until your symptoms are gone. If you do smoke, it is time to quit. You may receive instructions and counseling on how to stop smoking. Only take over-the-counter or prescription medicine for pain, discomfort, or fever as directed by your caregiver.   SEEK MEDICAL CARE IF:  You think you are having problems from the medicine you are taking. Read your medicine instructions carefully.  Your chest pain does not go away, even after treatment.  You develop a rash with blisters on your chest.   SEEK IMMEDIATE MEDICAL CARE IF:  You have increased chest pain or pain that spreads to your arm, neck, jaw, back, or belly (abdomen).  You develop shortness of breath, an increasing cough, or you are coughing up blood.  You have severe back or abdominal pain, feel sick to your stomach (nauseous) or throw up (vomit).  You develop severe weakness, fainting, or chills.  You have an oral temperature above 102 F (38.9 C), not controlled by medicine.   THIS IS AN EMERGENCY. Do not wait to see if the pain will go away. Get medical help at once. Call 911. Do not drive yourself to the hospital.

## 2018-09-08 NOTE — ED Triage Notes (Signed)
Pt here for eval of center to left sided chest pain that has been constant since 3pm. Pt states every now and then the pain "takes his breath away" but denies feeling short of breath. Pt reports he is belching more than normal. Hx of stent placement.

## 2018-09-08 NOTE — ED Provider Notes (Signed)
[facility] EMERGENCY DEPARTMENT Provider Note   CSN: [id number] Arrival date & time: 09/08/18  1705    History   Chief Complaint Chief Complaint  Patient presents with  . Chest Pain    HPI Joel [name] is a 78 y.o. male with history of CAD, CKD, COPD, htn diabetes presenting to emergency department today with chief complaint chest pain.  Onset was acute, starting 3 hours prior to arrival.  Patient states he was driving in his car when the chest pain started.  The pain is located in the left side of his chest and he describes it as tightening.  The pain does not radiate.  He describes the pain is intermittent. He took sublingual nitro however is unsure if he took it correctly as he did not feel the typical associated symptoms when it dissolved.  Also states the pain sometimes takes his breath away however he denies dyspnea. Also denies radiation to left/right arm, jaw or back, nausea, or diaphoresis, abdominal pain, nausea, vomiting.   Pt's cardiologist is Dr. Angelena Form. History provided by patient with additional history obtained from chart review.     Past Medical History:  Diagnosis Date  . Atypical chest pain   . CAD (coronary artery disease)    a. Multiple prior evaluations then 05/2012 s/p BMS to OM2. b. Nuc 11/2012 wnl. c. Cath 2015: patent stent, moderate LAD/RCA dz, treated medically.  . Cancer ([place])    Hx: of prostate  . Cataract   . CKD (chronic kidney disease), stage II   . COPD (chronic obstructive pulmonary disease) ([place])   . Cough secondary to angiotensin converting enzyme inhibitor (ACE-I)   . Diabetes mellitus without complication ([place])   . Diverticulosis   . DVT (deep venous thrombosis) ([place])   . Family history of breast cancer in first degree relative   . GERD (gastroesophageal reflux disease)   . Headache(784.0)    Hx: of  . History of blood transfusion    "I've had 2; don't know what it was related to" (11/09/2012)  . HLD  (hyperlipidemia)   . HTN (hypertension)   . Iron deficiency anemia   . Microcytic anemia   . Peripheral vascular disease ([place])    a. L pop-tibial bypass 2011, followed by [facility].  . Personal history of prostate cancer   . Rotator cuff injury    "left arm; never repaired" (11/09/2012)    Patient Active Problem List   Diagnosis Date Noted  . Seasonal allergies 07/04/2018  . Chronic obstructive pulmonary disease ([place]) 07/04/2018  . Dizziness 01/20/2018  . Malaise 01/20/2018  . Decreased breath sounds 01/20/2018  . Needs flu shot 01/20/2018  . Chronic nonintractable headache 12/07/2017  . Back pain 11/01/2017  . Radicular pain of lower extremity 11/01/2017  . Prostate cancer ([place]) 11/01/2017  . Family history of breast cancer in first degree relative   . Personal history of prostate cancer   . Elliptocytosis on peripheral blood smear ([place]) 09/13/2016  . Prediabetes 03/22/2016  . PVD (peripheral vascular disease) with claudication ([place]) 11/15/2012  . Chest pain, localized 11/10/2012  . Hypokalemia 11/10/2012  . Bradycardia 11/09/2012  . Thrombocytopenia ([place]) 06/01/2012  . Microcytic anemia   . Atherosclerosis of native arteries of the extremities with intermittent claudication 06/16/2011  . Stable angina ([place]) 06/11/2011  . CAD (coronary artery disease)   . Chest pain, atypical 05/30/2011  . Iron deficiency anemia 10/03/2009  . DIVERTICULOSIS OF COLON 10/03/2009  . WEIGHT LOSS, ABNORMAL 10/03/2009  .  CHEST PAIN UNSPECIFIED 09/13/2008  . Headache(784.0) 08/19/2008  . TIA 06/05/2008  . SHOULDER PAIN, LEFT 10/11/2007  . COUGH DUE TO ACE INHIBITORS 05/07/2007  . History of tobacco use disorder 04/25/2007  . Hyperlipidemia 04/21/2007  . Essential hypertension 04/21/2007  . GERD 04/21/2007    Past Surgical History:  Procedure Laterality Date  . CARDIOVASCULAR STRESS TEST  Aug. 2014  . CATARACT EXTRACTION W/ INTRAOCULAR LENS IMPLANT Right ~ 2008  . CIRCUMCISION    .  COLONOSCOPY    . CORONARY ANGIOPLASTY WITH STENT PLACEMENT  2014   "1" (11/09/2012)  . ILIAC ARTERY ANEURYSM REPAIR    . LEFT HEART CATH  05-31-12  . LEFT HEART CATHETERIZATION WITH CORONARY ANGIOGRAM N/A 06/01/2011   Procedure: LEFT HEART CATHETERIZATION WITH CORONARY ANGIOGRAM;  Surgeon: Hillary Bow, MD;  Location: [facility] CATH LAB;  Service: Cardiovascular;  Laterality: N/A;  . LEFT HEART CATHETERIZATION WITH CORONARY ANGIOGRAM N/A 05/31/2012   Procedure: LEFT HEART CATHETERIZATION WITH CORONARY ANGIOGRAM;  Surgeon: Peter M Martinique, MD;  Location: [facility] CATH LAB;  Service: Cardiovascular;  Laterality: N/A;  . LEFT HEART CATHETERIZATION WITH CORONARY ANGIOGRAM N/A 08/01/2013   Procedure: LEFT HEART CATHETERIZATION WITH CORONARY ANGIOGRAM;  Surgeon: Burnell Blanks, MD;  Location[facility] CATH LAB;  Service: Cardiovascular;  Laterality: N/A;  . PERCUTANEOUS CORONARY STENT INTERVENTION (PCI-S)  05/31/2012   Procedure: PERCUTANEOUS CORONARY STENT INTERVENTION (PCI-S);  Surgeon: Peter M Martinique, MD;  Location: [facility] CATH LAB;  Service: Cardiovascular;;  . PR VEIN BYPASS GRAFT,AORTO-FEM-POP Left 10/01/2009   left below knee popliteal artery to posterior tibial artery  . SHOULDER ARTHROSCOPY WITH OPEN ROTATOR CUFF REPAIR AND DISTAL CLAVICLE ACROMINECTOMY Left 01/12/2013   Procedure: LEFT SHOULDER ARTHROSCOPY WITH DEBRIDEMENT OPEN DISTAL CLAVICLE RESECTION ,acromioplastyAND ROTATOR CUFF REPAIR;  Surgeon: Yvette Rack., MD;  Location: [place];  Service: Orthopedics;  Laterality: Left;        Home Medications    Prior to Admission medications   Medication Sig Start Date End Date Taking? Authorizing Provider  amLODipine (NORVASC) 10 MG tablet TAKE 1 TABLET BY MOUTH EVERY DAY Patient taking differently: Take 10 mg by mouth daily.  08/10/18  Yes Henson, Vickie L, NP-C  aspirin EC 81 MG tablet Take 81 mg by mouth daily.    Yes [provider]  atorvastatin (LIPITOR) 40 MG tablet Take 1 tablet (40 mg  total) by mouth daily. Patient taking differently: Take 40 mg by mouth at bedtime.  09/07/18  Yes Burnell Blanks, MD  ferrous sulfate 325 (65 FE) MG tablet Take 325 mg by mouth daily with breakfast.    Yes [provider]  losartan-hydrochlorothiazide (HYZAAR) 100-25 MG tablet TAKE 1 TABLET BY MOUTH DAILY 07/24/18  Yes Henson, Vickie L, NP-C  omeprazole (PRILOSEC) 20 MG capsule Take 20 mg by mouth 2 (two) times daily before a meal.   Yes [provider]  traMADol (ULTRAM) 50 MG tablet Take 1 tablet (50 mg total) by mouth every 8 (eight) hours as needed for severe pain. 02/15/18  Yes Henson, Vickie L, NP-C  abiraterone Acetate (ZYTIGA) 250 MG tablet Take 1,000 mg by mouth daily before breakfast. Take on an empty stomach 1 hour before or 2 hours after a meal     [provider]  albuterol (PROVENTIL HFA;VENTOLIN HFA) 108 (90 Base) MCG/ACT inhaler Inhale 2 puffs into the lungs every 6 (six) hours as needed for wheezing or shortness of breath. 07/04/18   Henson, Vickie L, NP-C  brimonidine (  ALPHAGAN) 0.2 % ophthalmic solution Place 1 drop into the right eye 2 (two) times daily.  09/13/17   [provider]  diclofenac sodium (VOLTAREN) 1 % GEL Apply 2 g topically 4 (four) times daily.    [provider]  fluticasone (FLONASE) 50 MCG/ACT nasal spray SHAKE LIQUID AND USE 2 SPRAYS IN EACH NOSTRIL DAILY 07/31/18   Henson, Vickie L, NP-C  gabapentin (NEURONTIN) 600 MG tablet Take 600 mg by mouth daily.    [provider]  isosorbide mononitrate (IMDUR) 30 MG 24 hr tablet TAKE 1 TABLET(30 MG) BY MOUTH DAILY 05/08/18   Burnell Blanks, MD  loratadine (CLARITIN) 10 MG tablet TAKE 1 TABLET(10 MG) BY MOUTH DAILY 07/31/18   Henson, Vickie L, NP-C  nitroGLYCERIN (NITROSTAT) 0.4 MG SL tablet Place 1 tablet (0.4 mg total) under the tongue every 5 (five) minutes as needed for chest pain. 04/27/18   Lyda Jester M, PA-C  predniSONE (DELTASONE) 5 MG tablet  Take 5 mg by mouth daily before breakfast.     [provider]  tamsulosin (FLOMAX) 0.4 MG CAPS capsule Take 0.4 mg at bedtime by mouth.    [provider]    Family History Family History  Problem Relation Age of Onset  . Breast cancer Mother        dx 81s  . Hyperlipidemia Mother   . Hypertension Mother   . COPD Father        deceased  . Hyperlipidemia Father   . Hypertension Father   . Hypertension Sister   . Cancer Brother 36       Unknown cancer, possibly stomach  . Hypertension Brother   . Cancer Brother 31       Unknown, possibly lung  . Hypertension Daughter   . Hypertension Son   . Colon cancer Neg Hx        pt is unclear about family hx    Social History Social History   Tobacco Use  . Smoking status: Former Smoker    Packs/day: 0.00    Types: Cigarettes    Last attempt to quit: 05/26/2011    Years since quitting: 7.2  . Smokeless tobacco: Never Used  Substance Use Topics  . Alcohol use: No  . Drug use: No     Allergies   Topiramate; Atenolol; Lisinopril; and Tramadol-acetaminophen   Review of Systems Review of Systems  Constitutional: Negative for chills and fever.  HENT: Negative for congestion, rhinorrhea, sinus pressure and sore throat.   Eyes: Negative for pain and redness.  Respiratory: Negative for cough, shortness of breath and wheezing.   Cardiovascular: Positive for chest pain. Negative for palpitations.  Gastrointestinal: Negative for abdominal pain, constipation, diarrhea, nausea and vomiting.  Genitourinary: Negative for dysuria.  Musculoskeletal: Negative for arthralgias, back pain, myalgias and neck pain.  Skin: Negative for rash and wound.  Neurological: Negative for dizziness, syncope, weakness, numbness and headaches.  Psychiatric/Behavioral: Negative for confusion.     Physical Exam Updated Vital Signs BP 123/78   Pulse 60   Temp 98.4 F (36.9 C) (Oral)   Resp (!) 22   SpO2 97%   Physical Exam  Vitals signs and nursing note reviewed.  Constitutional:      General: He is not in acute distress.    Appearance: He is not ill-appearing.  HENT:     Head: Normocephalic and atraumatic.     Right Ear: Tympanic membrane and external ear normal.     Left Ear:  Tympanic membrane and external ear normal.     Nose: Nose normal.     Mouth/Throat:     Mouth: Mucous membranes are moist.     Pharynx: Oropharynx is clear.  Eyes:     General: No scleral icterus.       Right eye: No discharge.        Left eye: No discharge.     Extraocular Movements: Extraocular movements intact.     Conjunctiva/sclera: Conjunctivae normal.     Pupils: Pupils are equal, round, and reactive to light.  Neck:     Musculoskeletal: Normal range of motion.     Vascular: No JVD.  Cardiovascular:     Rate and Rhythm: Normal rate and regular rhythm.     Pulses: Normal pulses.          Radial pulses are 2+ on the right side and 2+ on the left side.     Heart sounds: Normal heart sounds.  Pulmonary:     Comments: Lungs clear to auscultation in all fields. Symmetric chest rise. No wheezing, rales, or rhonchi. Abdominal:     Comments: Abdomen is soft, non-distended, and non-tender in all quadrants. No rigidity, no guarding. No peritoneal signs.  Musculoskeletal: Normal range of motion.  Skin:    General: Skin is warm and dry.     Capillary Refill: Capillary refill takes less than 2 seconds.  Neurological:     Mental Status: He is oriented to person, place, and time.     GCS: GCS eye subscore is 4. GCS verbal subscore is 5. GCS motor subscore is 6.     Comments: Fluent speech, no facial droop.  Psychiatric:        Behavior: Behavior normal.      ED Treatments / Results  Labs (all labs ordered are listed, but only abnormal results are displayed) Labs Reviewed  BASIC METABOLIC PANEL - Abnormal; Notable for the following components:      Result Value   Glucose, Bld 119 (*)    Creatinine, Ser 1.32 (*)    GFR  calc non Af Amer 51 (*)    GFR calc Af Amer 59 (*)    All other components within normal limits  CBC - Abnormal; Notable for the following components:   Hemoglobin 9.9 (*)    HCT 33.5 (*)    MCV 71.7 (*)    MCH 21.2 (*)    MCHC 29.6 (*)    RDW 17.3 (*)    All other components within normal limits  TROPONIN I  TROPONIN I    EKG EKG Interpretation  Date/Time:  Friday Sep 08 2018 17:07:35 EDT Ventricular Rate:  64 PR Interval:  248 QRS Duration: 72 QT Interval:  414 QTC Calculation: 427 R Axis:   50 Text Interpretation:  Sinus rhythm with 1st degree A-V block Confirmed by Lajean Saver [phone]) on 09/08/2018 7:30:09 PM   Radiology Dg Chest 2 View  Result Date: 09/08/2018 CLINICAL DATA:  Left-sided chest pain today. History of hypertension and diabetes. EXAM: CHEST - 2 VIEW COMPARISON:  07/24/2018 FINDINGS: Heart size within normal limits. There is aortic atherosclerosis. The pulmonary vascularity is normal. Lungs are clear. No effusions. Ordinary degenerative changes affect the spine. IMPRESSION: No active disease.  Aortic atherosclerosis. Electronically Signed   By: Nelson Chimes M.D.   On: 09/08/2018 18:52    Procedures Procedures (including critical care time)  Medications Ordered in ED Medications  sodium chloride flush (NS) 0.9 % injection 3  mL (3 mLs Intravenous Not Given 09/08/18 1919)  metoCLOPramide (REGLAN) injection 10 mg (10 mg Intramuscular Given 09/08/18 2024)     Initial Impression / Assessment and Plan / ED Course  I have reviewed the triage vital signs and the nursing notes.  Pertinent labs & imaging results that were available during my care of the patient were reviewed by me and considered in my medical decision making (see chart for details).  78 yo male presents with CP. Patient nontoxic appearing, in no apparent distress, vitals without significant abnormality. Benign physical exam. Evaluation initiated with labs, EKG, and CXR. Patient on cardiac  monitor. On reassessment pt reports mild headache, similar to past. Full neuro exam performed and is without focal deficit. IM Reglan given and headace resolved.  Work-up in the ER unremarkable. Labs reviewed, no leukocytosis, or significant electrolyte abnormality. Microcytic anemia appears unchanged from previous labs.CXR viewed by me is without infiltrate, effusion, pneumothorax, or fracture/dislocation. EKG also viewed by me is without obvious ischemia, and is unchanged from prior. delta troponin negative, doubt ACS. Patient has appeared hemodynamically stable throughout ER visit and appears safe for discharge with close cardiology follow up within the next week. I discussed results, treatment plan, importance fof follow-up, and strict return precautions with the patient including return of cp. Provided opportunity for questions, patient confirmed understanding and is in agreement with plan. Case has been discussed with and seen by Dr. Ashok Cordia who agrees with the above plan to discharge.   This note was prepared using Dragon voice recognition software and may include unintentional dictation errors due to the inherent limitations of voice recognition software.    Final Clinical Impressions(s) / ED Diagnoses   Final diagnoses:  Chest pain, unspecified type    ED Discharge Orders    None       Flint Melter 09/08/18 2130    Lajean Saver, MD 09/09/18 [phone]

## 2018-09-10 ENCOUNTER — Other Ambulatory Visit: Payer: Self-pay | Admitting: Family Medicine

## 2018-09-10 NOTE — Progress Notes (Signed)
Joel [name] is a 78 y.o. male who presents for annual wellness visit, CPE and follow-up on chronic medical conditions.  He has the following concerns:  States he has intermittent chest pain and went to the ED for this last week. He is supposed to follow up with his cardiologist this week.  States nitroglycerin did not help. Last episode was yesterday morning.  Denies chest pain today.  No palpitations, DOE, orthopnea, PND, LE edema.   Last Hgb A1c 6.5% and is due for a recheck.   Requests refill of Tramadol for pain. He takes this sparingly. Last refilled for him 6 months ago.   Followed at the [facility] for prostate cancer. Gets injections every 6 months.   History of smoking. Quit in 2013. Does not want lung cancer screening. States he was told years ago that he had COPD. Uses albuterol once every other week. No recent coughing, wheezing or shortness of breath.   Seeing a specialist for back pain. Has MRI scheduled in July.    Immunization History  Administered Date(s) Administered  . Influenza, High Dose Seasonal PF 02/08/2017, 01/20/2018   Last colonoscopy: 2019  Last PSA: managed at the [facility] Dentist: years ago since visit  [facility]ho[facility]  Exercise: active but no regular exercise   Diet consists of hot dogs and chicken. Does not eat breakfast.   Other doctors caring for patient include: Dr. Bertell [facility] Dr. Laurell Roof- [facility] Dr. Bobette Mo- Vascular Cardiology- Dr. Angelena Form Dr. Domingo Cocking- headache specialist. Getting injections.  Spine specialist on [place] for back pain     Depression screen:  See questionnaire below.     Depression screen [facility] 2/9 09/11/2018 08/24/2017 06/05/2014  Decreased Interest 0 0 0  Down, Depressed, Hopeless 0 0 0  PHQ - 2 Score 0 0 0  Some recent data might be hidden    Fall Screen: See Questionaire below.   Fall Risk  09/11/2018 01/31/2018 08/24/2017 06/05/2014  Falls in the past year? 0 No No No  Number falls in past yr: 0 -  - -  Injury with Fall? 0 - - -    ADL screen:  See questionnaire below.  Functional Status Survey: Is the patient deaf or have difficulty hearing?: No Does the patient have difficulty seeing, even when wearing glasses/contacts?: No Does the patient have difficulty concentrating, remembering, or making decisions?: No Does the patient have difficulty walking or climbing stairs?: Yes(doesn't go up stairs due to back pain) Does the patient have difficulty dressing or bathing?: No Does the patient have difficulty doing errands alone such as visiting a doctor's office or shopping?: No   End of Life Discussion:  Patient does not have a living will and medical power of attorney. daughter Meredith Mody is who he plans to have as his documented HCPOA. MOST form filled out.    Review of Systems  Constitutional: -fever, -chills, -sweats, -unexpected weight change, -anorexia, -fatigue Allergy: -sneezing, -itching, -congestion Dermatology: denies changing moles, rash, lumps, new worrisome lesions ENT: -runny nose, -ear pain, -sore throat, -hoarseness, -sinus pain, -teeth pain, -tinnitus, -hearing loss, -epistaxis Cardiology:  +chest pain, -palpitations, -edema, -orthopnea, -paroxysmal nocturnal dyspnea Respiratory: -cough, -shortness of breath, -dyspnea on exertion, -wheezing, -hemoptysis Gastroenterology: -abdominal pain, -nausea, -vomiting, -diarrhea, -constipation, -blood in stool, -changes in bowel movement, -dysphagia Hematology: -bleeding or bruising problems Musculoskeletal: -arthralgias, -myalgias, -joint swelling, +back pain, -neck pain, -cramping, -gait changes Ophthalmology: -vision changes, -eye redness, -itching, -discharge Urology: -dysuria, -difficulty urinating, -hematuria, -urinary frequency, -  urgency, incontinence Neurology: + intermittent headache, -weakness, -tingling, -numbness, -speech abnormality, -memory loss, -falls, -dizziness Psychology:  -depressed mood, -agitation,  -sleep problems   PHYSICAL EXAM:  BP 120/60   Pulse 68   Temp 97.6 F (36.4 C) (Oral)   Ht 5\' 9"  (1.753 m)   Wt 254 lb (115.2 kg)   BMI 37.51 kg/m   General Appearance: Alert, cooperative, no distress, appears stated age Head: Normocephalic, without obvious abnormality, atraumatic Eyes: PERRL, conjunctiva/corneas clear, EOM's intact, fundi benign Ears: Normal TM's and external ear canals Nose: deferred due to current pandemic and he is wearing a mask Throat: deferred  Neck: Supple, no lymphadenopathy, thyroid:no enlargement/tenderness/nodules Back: Spine nontender, no curvature, ROM normal, no CVA tenderness Lungs: Clear to auscultation bilaterally without wheezes, rales or ronchi; respirations unlabored Chest Wall: No tenderness or deformity Heart: Regular rate and rhythm, S1 and S2 normal, no murmur, rub or gallop Breast Exam: No chest wall tenderness, masses or gynecomastia Abdomen: Soft, non-tender, nondistended, normoactive bowel sounds, no masses, no hepatosplenomegaly Genitalia: declines  Extremities: No clubbing, cyanosis or edema Pulses: 2+ and symmetric all extremities Skin: Skin color, texture, turgor normal, no rashes or lesions Lymph nodes: Cervical, supraclavicular, and axillary nodes normal Neurologic: CNII-XII intact, normal strength, sensation and gait; reflexes 2+ and symmetric throughout   Psych: Normal mood, affect, hygiene and grooming  ASSESSMENT/PLAN: Medicare annual wellness visit, subsequent  Essential hypertension  Prediabetes - Plan: HgB A1c  Routine general medical examination at a health care facility  Intermittent left-sided chest pain  Prostate cancer ([place])  Immunization counseling  Advance directive discussed with patient  Chronic nonintractable headache, unspecified headache type  Chronic low back pain, unspecified back pain laterality, unspecified whether sciatica present  Chronic obstructive pulmonary disease, unspecified COPD  type ([place])  He is doing well today. Feeling at his baseline.  He is under the care of multiple providers and also has a primary care provider at the [place].  Hgb A1c is 6.5% and unchanged since 7 months ago.  BP is in goal range. No changes to medication regimen needed.  Has appt for follow up with his cardiologist this week. States he is supposed to have his cholesterol and other labs checked by them.  Statin was changed by Dr. Angelena Form Followed at the [facility] for prostate cancer  Dr. Domingo Cocking managing his headaches  COPD- unknown who diagnosed him with this. Controlled. Using albuterol infrequently.  Tramadol refilled and he is aware of potential side effects and that he should use this sparingly which he does. Will check CSRS. Plan to have him discuss this with his back specialist and Dr. Julianne Handler. He is using this for various pains.  MOST form filled out. He has living will and HCPOA paperwork at home that he will bring in at his convenience.  Counseling on immunizations. He declines pneumonia vaccine today. May return for this.  Advised to get records of Tdap and to get this updated if due.  Follow up in 6 months or sooner if needed.    Discussed PSA screening (risks/benefits), recommended at least 30 minutes of aerobic activity at least 5 days/week; proper sunscreen use reviewed; healthy diet and alcohol recommendations (less than or equal to 2 drinks/day) reviewed; regular seatbelt use; changing batteries in smoke detectors. Immunization recommendations discussed.  Colonoscopy recommendations reviewed.   Medicare Attestation I have personally reviewed: The patient's medical and social history Their use of alcohol, tobacco or illicit drugs Their current medications and supplements The patient's functional ability including  ADLs,fall risks, home safety risks, cognitive, and hearing and visual impairment Diet and physical activities Evidence for depression or mood disorders  The patient's  weight, height, and BMI have been recorded in the chart.  I have made referrals, counseling, and provided education to the patient based on review of the above and I have provided the patient with a written personalized care plan for preventive services.     [name], NP-C   09/11/2018

## 2018-09-11 ENCOUNTER — Encounter: Payer: Self-pay | Admitting: Family Medicine

## 2018-09-11 ENCOUNTER — Ambulatory Visit (INDEPENDENT_AMBULATORY_CARE_PROVIDER_SITE_OTHER): Payer: Medicare HMO | Admitting: Family Medicine

## 2018-09-11 ENCOUNTER — Telehealth: Payer: Self-pay | Admitting: Cardiovascular Disease

## 2018-09-11 ENCOUNTER — Other Ambulatory Visit: Payer: Self-pay

## 2018-09-11 VITALS — BP 120/60 | HR 68 | Temp 97.6°F | Ht 69.0 in | Wt 254.0 lb

## 2018-09-11 DIAGNOSIS — I10 Essential (primary) hypertension: Secondary | ICD-10-CM | POA: Diagnosis not present

## 2018-09-11 DIAGNOSIS — C61 Malignant neoplasm of prostate: Secondary | ICD-10-CM

## 2018-09-11 DIAGNOSIS — Z7189 Other specified counseling: Secondary | ICD-10-CM

## 2018-09-11 DIAGNOSIS — R51 Headache: Secondary | ICD-10-CM

## 2018-09-11 DIAGNOSIS — R519 Headache, unspecified: Secondary | ICD-10-CM

## 2018-09-11 DIAGNOSIS — G8929 Other chronic pain: Secondary | ICD-10-CM

## 2018-09-11 DIAGNOSIS — R7303 Prediabetes: Secondary | ICD-10-CM | POA: Diagnosis not present

## 2018-09-11 DIAGNOSIS — M545 Low back pain, unspecified: Secondary | ICD-10-CM

## 2018-09-11 DIAGNOSIS — Z0000 Encounter for general adult medical examination without abnormal findings: Secondary | ICD-10-CM

## 2018-09-11 DIAGNOSIS — R0789 Other chest pain: Secondary | ICD-10-CM

## 2018-09-11 DIAGNOSIS — J449 Chronic obstructive pulmonary disease, unspecified: Secondary | ICD-10-CM | POA: Diagnosis not present

## 2018-09-11 DIAGNOSIS — Z7185 Encounter for immunization safety counseling: Secondary | ICD-10-CM

## 2018-09-11 LAB — POCT GLYCOSYLATED HEMOGLOBIN (HGB A1C): Hemoglobin A1C: 6.5 % — AB (ref 4.0–5.6)

## 2018-09-11 NOTE — Telephone Encounter (Signed)
I reviewed the ED notes. A virtual visit will probably be ok. Are there any spots on the schedules of my care team APPs?

## 2018-09-11 NOTE — Telephone Encounter (Signed)
Joel [name], please check controll substance for this patient

## 2018-09-11 NOTE — Telephone Encounter (Signed)
New Message   Pt c/o of Chest Pain: STAT if CP now or developed within 24 hours  1. Are you having CP right now? No, patient feels slightly abnormal  2. Are you experiencing any other symptoms (ex. SOB, nausea, vomiting, sweating)? No  3. How long have you been experiencing CP? Friday patient was driving and felt the chest pain.   4. Is your CP continuous or coming and going? Coming and going   5. Have you taken Nitroglycerin? Yes taken Friday.  ?

## 2018-09-11 NOTE — Telephone Encounter (Signed)
I scheduled pt for video visit with Joel Barrios, PA on June 3,2020 at 2:00 Consent obtained on June 1,2020.  Virtual Visit Pre-Appointment Phone Call  "(Name), I am calling you today to discuss your upcoming appointment. We are currently trying to limit exposure to the virus that causes COVID-19 by seeing patients at home rather than in the office."  1. "What is the BEST phone number to call the day of the visit?" - include this in appointment notes [phone]  2. "Do you have or have access to (through a family member/friend) a smartphone with video capability that we can use for your visit?" yes a. If yes - list this number in appt notes as "cell" (if different from BEST phone #) and list the appointment type as a VIDEO visit in appointment notes yes b. If no - list the appointment type as a PHONE visit in appointment notes  3. Confirm consent - "In the setting of the current Covid19 crisis, you are scheduled for a (phone or video) visit with your provider on (date) at (time).  Just as we do with many in-office visits, in order for you to participate in this visit, we must obtain consent.  If you'd like, I can send this to your mychart (if signed up) or email for you to review.  Otherwise, I can obtain your verbal consent now.  All virtual visits are billed to your insurance company just like a normal visit would be.  By agreeing to a virtual visit, we'd like you to understand that the technology does not allow for your provider to perform an examination, and thus may limit your provider's ability to fully assess your condition. If your provider identifies any concerns that need to be evaluated in person, we will make arrangements to do so.  Finally, though the technology is pretty good, we cannot assure that it will always work on either your or our end, and in the setting of a video visit, we may have to convert it to a phone-only visit.  In either situation, we cannot ensure that we have a  secure connection.  Are you willing to proceed?" STAFF: Did the patient verbally acknowledge consent to telehealth visit? Document YES/NO here: yes  4. Advise patient to be prepared - "Two hours prior to your appointment, go ahead and check your blood pressure, pulse, oxygen saturation, and your weight (if you have the equipment to check those) and write them all down. When your visit starts, your provider will ask you for this information. If you have an Apple Watch or Kardia device, please plan to have heart rate information ready on the day of your appointment. Please have a pen and paper handy nearby the day of the visit as well."  5. Give patient instructions for MyChart download to smartphone OR Doximity/Doxy.me as below if video visit (depending on what platform provider is using)  6. Inform patient they will receive a phone call 15 minutes prior to their appointment time (may be from unknown caller ID) so they should be prepared to answer    Joel [name] has been deemed a candidate for a follow-up tele-health visit to limit community exposure during the Covid-19 pandemic. I spoke with the patient via phone to ensure availability of phone/video source, confirm preferred email & phone number, and discuss instructions and expectations.  I reminded Joel [name] to be prepared with any vital sign and/or heart rhythm information that could potentially  be obtained via home monitoring, at the time of his visit. I reminded Joel [name] to expect a phone call prior to his visit.  Joel Liverpool, RN 09/11/2018 1:18 PM   INSTRUCTIONS FOR DOWNLOADING THE MYCHART APP TO SMARTPHONE  - The patient must first make sure to have activated MyChart and know their login information - If Apple, go to [employer] and type in MyChart in the search bar and download the app. If Android, ask patient to go to [employer] and type in [place] in the search bar and download the  app. The app is free but as with any other app downloads, their phone may require them to verify saved payment information or Apple/Android password.  - The patient will need to then log into the app with their MyChart username and password, and select [facility] as their healthcare provider to link the account. When it is time for your visit, go to the MyChart app, find appointments, and click Begin Video Visit. Be sure to Select Allow for your device to access the Microphone and Camera for your visit. You will then be connected, and your provider will be with you shortly.  **If they have any issues connecting, or need assistance please contact MyChart service desk [phone])**  **If using a computer, in order to ensure the best quality for their visit they will need to use either of the following Internet Browsers: [employer], or Google Chrome**  IF USING DOXIMITY or DOXY.ME - The patient will receive a link just prior to their visit by text.     FULL LENGTH CONSENT FOR TELE-HEALTH VISIT   I hereby voluntarily request, consent and authorize [facility] and its employed or contracted physicians, physician assistants, nurse practitioners or other licensed health care professionals (the Practitioner), to provide me with telemedicine health care services (the "Services") as deemed necessary by the treating Practitioner. I acknowledge and consent to receive the Services by the Practitioner via telemedicine. I understand that the telemedicine visit will involve communicating with the Practitioner through live audiovisual communication technology and the disclosure of certain medical information by electronic transmission. I acknowledge that I have been given the opportunity to request an in-person assessment or other available alternative prior to the telemedicine visit and am voluntarily participating in the telemedicine visit.  I understand that I have the right to withhold or  withdraw my consent to the use of telemedicine in the course of my care at any time, without affecting my right to future care or treatment, and that the Practitioner or I may terminate the telemedicine visit at any time. I understand that I have the right to inspect all information obtained and/or recorded in the course of the telemedicine visit and may receive copies of available information for a reasonable fee.  I understand that some of the potential risks of receiving the Services via telemedicine include:  Marland Kitchen Delay or interruption in medical evaluation due to technological equipment failure or disruption; . Information transmitted may not be sufficient (e.g. poor resolution of images) to allow for appropriate medical decision making by the Practitioner; and/or  . In rare instances, security protocols could fail, causing a breach of personal health information.  Furthermore, I acknowledge that it is my responsibility to provide information about my medical history, conditions and care that is complete and accurate to the best of my ability. I acknowledge that Practitioner's advice, recommendations, and/or decision may be based on factors not within their control,  such as incomplete or inaccurate data provided by me or distortions of diagnostic images or specimens that may result from electronic transmissions. I understand that the practice of medicine is not an exact science and that Practitioner makes no warranties or guarantees regarding treatment outcomes. I acknowledge that I will receive a copy of this consent concurrently upon execution via email to the email address I last provided but may also request a printed copy by calling the office of [facility].    I understand that my insurance will be billed for this visit.   I have read or had this consent read to me. . I understand the contents of this consent, which adequately explains the benefits and risks of the Services being provided via  telemedicine.  . I have been provided ample opportunity to ask questions regarding this consent and the Services and have had my questions answered to my satisfaction. . I give my informed consent for the services to be provided through the use of telemedicine in my medical care  By participating in this telemedicine visit I agree to the above.

## 2018-09-11 NOTE — Telephone Encounter (Signed)
Is this okay to refill? Pt coming in today

## 2018-09-11 NOTE — Patient Instructions (Addendum)
Call and let us know if you decide you would like to have the pneumonia vaccine as we discussed.  Please check and see when your last Tdap (Tetanus, diptheria and pertussis) vaccine was. If it has been more than 10 years then you should get this vaccine.  If you decide to get a vaccine to help prevent shingles you can check with your insurance and get this at your pharmacy. It is called Shingrix and is a 2 shot series.   Your BP today is 120/60 and this is normal.   Your blood sugars are in a good range with a Hemoglobin A1c of 6.5%   Make sure you are eating a healthy diet and limit bread, potatoes, rice, pasta and sweets.  Stay active.   Follow up with your specialists as scheduled.       Preventive Care 49 Years and Older, Male Preventive care refers to lifestyle choices and visits with your health care provider that can promote health and wellness. What does preventive care include?   A yearly physical exam. This is also called an annual well check.  Dental exams once or twice a year.  Routine eye exams. Ask your health care provider how often you should have your eyes checked.  Personal lifestyle choices, including: ? Daily care of your teeth and gums. ? Regular physical activity. ? Eating a healthy diet. ? Avoiding tobacco and drug use. ? Limiting alcohol use. ? Practicing safe sex. ? Taking low doses of aspirin every day. ? Taking vitamin and mineral supplements as recommended by your health care provider. What happens during an annual well check? The services and screenings done by your health care provider during your annual well check will depend on your age, overall health, lifestyle risk factors, and family history of disease. Counseling Your health care provider may ask you questions about your:  Alcohol use.  Tobacco use.  Drug use.  Emotional well-being.  Home and relationship well-being.  Sexual activity.  Eating habits.  History of  falls.  Memory and ability to understand (cognition).  Work and work Statistician. Screening You may have the following tests or measurements:  Height, weight, and BMI.  Blood pressure.  Lipid and cholesterol levels. These may be checked every 5 years, or more frequently if you are over 18 years old.  Skin check.  Lung cancer screening. You may have this screening every year starting at age 19 if you have a 30-pack-year history of smoking and currently smoke or have quit within the past 15 years.  Colorectal cancer screening. All adults should have this screening starting at age 55 and continuing until age 66. You will have tests every 1-10 years, depending on your results and the type of screening test. People at increased risk should start screening at an earlier age. Screening tests may include: ? Guaiac-based fecal occult blood testing. ? Fecal immunochemical test (FIT). ? Stool DNA test. ? Virtual colonoscopy. ? Sigmoidoscopy. During this test, a flexible tube with a tiny camera (sigmoidoscope) is used to examine your rectum and lower colon. The sigmoidoscope is inserted through your anus into your rectum and lower colon. ? Colonoscopy. During this test, a long, thin, flexible tube with a tiny camera (colonoscope) is used to examine your entire colon and rectum.  Prostate cancer screening. Recommendations will vary depending on your family history and other risks.  Hepatitis C blood test.  Hepatitis B blood test.  Sexually transmitted disease (STD) testing.  Diabetes screening. This is  done by checking your blood sugar (glucose) after you have not eaten for a while (fasting). You may have this done every 1-3 years.  Abdominal aortic aneurysm (AAA) screening. You may need this if you are a current or former smoker.  Osteoporosis. You may be screened starting at age 77 if you are at high risk. Talk with your health care provider about your test results, treatment options, and  if necessary, the need for more tests. Vaccines Your health care provider may recommend certain vaccines, such as:  Influenza vaccine. This is recommended every year.  Tetanus, diphtheria, and acellular pertussis (Tdap, Td) vaccine. You may need a Td booster every 10 years.  Varicella vaccine. You may need this if you have not been vaccinated.  Zoster vaccine. You may need this after age 48.  Measles, mumps, and rubella (MMR) vaccine. You may need at least one dose of MMR if you were born in 1957 or later. You may also need a second dose.  Pneumococcal 13-valent conjugate (PCV13) vaccine. One dose is recommended after age 36.  Pneumococcal polysaccharide (PPSV23) vaccine. One dose is recommended after age 27.  Meningococcal vaccine. You may need this if you have certain conditions.  Hepatitis A vaccine. You may need this if you have certain conditions or if you travel or work in places where you may be exposed to hepatitis A.  Hepatitis B vaccine. You may need this if you have certain conditions or if you travel or work in places where you may be exposed to hepatitis B.  Haemophilus influenzae type b (Hib) vaccine. You may need this if you have certain risk factors. Talk to your health care provider about which screenings and vaccines you need and how often you need them. This information is not intended to replace advice given to you by your health care provider. Make sure you discuss any questions you have with your health care provider. Document Released: 04/25/2015 Document Revised: 05/19/2017 Document Reviewed: 01/28/2015 Elsevier Interactive Patient Education  2019 [employer].

## 2018-09-11 NOTE — Telephone Encounter (Signed)
Ok to refill. It has been 6 months and he is using this sparingly.  Please ask Beverlee Nims to check him in the controlled substance reporting system.

## 2018-09-11 NOTE — Telephone Encounter (Signed)
I spoke with Joel [name]. He is requesting visit. He reports chest pain on Friday. Was seen in ED.  Instructed to follow up with cardiology. Joel [name] reports episode of chest pain on Saturday and Sunday.  Did not take NTG. Went away on it's own.  No belching associated with these episodes.  He reports he is currently in NP's office having a physical. Will review with Dr. Angelena Form if virtual or in office visit indicated.

## 2018-09-13 ENCOUNTER — Telehealth (INDEPENDENT_AMBULATORY_CARE_PROVIDER_SITE_OTHER): Payer: Medicare HMO | Admitting: Physician Assistant

## 2018-09-13 ENCOUNTER — Telehealth: Payer: Self-pay

## 2018-09-13 ENCOUNTER — Encounter: Payer: Self-pay | Admitting: Physician Assistant

## 2018-09-13 ENCOUNTER — Other Ambulatory Visit: Payer: Self-pay

## 2018-09-13 VITALS — BP 118/61 | HR 70 | Ht 68.5 in | Wt 254.0 lb

## 2018-09-13 DIAGNOSIS — I2510 Atherosclerotic heart disease of native coronary artery without angina pectoris: Secondary | ICD-10-CM | POA: Diagnosis not present

## 2018-09-13 DIAGNOSIS — R0789 Other chest pain: Secondary | ICD-10-CM | POA: Diagnosis not present

## 2018-09-13 DIAGNOSIS — I10 Essential (primary) hypertension: Secondary | ICD-10-CM | POA: Diagnosis not present

## 2018-09-13 DIAGNOSIS — E785 Hyperlipidemia, unspecified: Secondary | ICD-10-CM | POA: Diagnosis not present

## 2018-09-13 DIAGNOSIS — R079 Chest pain, unspecified: Secondary | ICD-10-CM

## 2018-09-13 NOTE — Telephone Encounter (Signed)
   Patient scheduled for fasting labs on 09/15/18. Patient answered to no to all questions below and will wear his mask when he comes for his appt.  COVID-19 Pre-Screening Questions:  . In the past 7 to 10 days have you had a cough,  shortness of breath, headache, congestion, fever (100 or greater) body aches, chills, sore throat, or sudden loss of taste or sense of smell? NO . Have you been around anyone with known Covid 19? NO . Have you been around anyone who is awaiting Covid 19 test results in the past 7 to 10 days? NO . Have you been around anyone who has been exposed to Covid 19, or has mentioned symptoms of Covid 19 within the past 7 to 10 days? NO

## 2018-09-13 NOTE — Patient Instructions (Signed)
Medication Instructions:  Your physician recommends that you continue on your current medications as directed. Please refer to the Current Medication list given to you today.  If you need a refill on your cardiac medications before your next appointment, please call your pharmacy.   Lab work: Your physician recommends that you return for a FASTING lipid profile and liver function panel on 09/15/18  If you have labs (blood work) drawn today and your tests are completely normal, you will receive your results only by: Marland Kitchen MyChart Message (if you have MyChart) OR . A paper copy in the mail If you have any lab test that is abnormal or we need to change your treatment, we will call you to review the results.  Testing/Procedures: None ordered  Follow-Up: At [facility], you and your health needs are our priority.  As part of our continuing mission to provide you with exceptional heart care, we have created designated Provider Care Teams.  These Care Teams include your primary Cardiologist (physician) and Advanced Practice Providers (APPs -  Physician Assistants and Nurse Practitioners) who all work together to provide you with the care you need, when you need it. . You will need a follow up appointment in 6 months.  Please call our office 2 months in advance to schedule this appointment.  You may see Darlina Guys, MD or one of the following Advanced Practice Providers on your designated Care Team:   . Lyda Jester, PA-C . Dayna Dunn, PA-C . Ermalinda Barrios, PA-C  Any Other Special Instructions Will Be Listed Below (If Applicable).

## 2018-09-13 NOTE — Progress Notes (Signed)
Virtual Visit via Video Note   This visit type was conducted due to national recommendations for restrictions regarding the COVID-19 Pandemic (e.g. social distancing) in an effort to limit this patient's exposure and mitigate transmission in our community.  Due to his co-morbid illnesses, this patient is at least at moderate risk for complications without adequate follow up.  This format is felt to be most appropriate for this patient at this time.  All issues noted in this document were discussed and addressed.  A limited physical exam was performed with this format.  Please refer to the patient's chart for his consent to telehealth for [facility].   Date:  09/13/2018   ID:  Joel [name], DOB Aug 22, 1940, MRN [id number]  Patient Location: Home Provider Location: Home  PCP:  Girtha Rm, NP-C  Cardiologist:  Lauree Chandler, MD  Electrophysiologist:  None   Evaluation Performed:  Follow-Up Visit  Chief Complaint:   Hospital f/u  History of Present Illness:    Joel [name] is a 78 y.o. male with history of hypertension, HLD, PAD, DM, CAD dating back to 05/2012 with BMS to OM2 and moderate LAD and RCA disease.negative lexiscan 10/2012.  Cardiac cath 2015 moderate disease in the LAD and RCA.  He has been to the ED several times with chest pain be atypical felt mostly related to GERD or MS pain. NST 11/2018showed EF 55% with moderate inferior defect that partially improves consistent with ischemia and possible soft tissue attenuation.  Small region of anterior anterior septal ischemia.  Read as an intermediate study.  Patient was in ED 07/24/18 with chest pain, troponins negative EKG unchanged. Saw Dr. Angelena Form 4/20/20and no further pain or w/u recommended. Patient back in ED with chest pain 08/19/18 relieved with belching.  Complained of tingling chest pain that would come and go. No chest tightness or shortness. Unrelieved with 3 NTG so went to ED. Worked on his car  yesterday without problem. Has chronic back pain and has an appt. Next month.Can't walk far with back problems   The patient does not have symptoms concerning for COVID-19 infection (fever, chills, cough, or new shortness of breath).    Past Medical History:  Diagnosis Date   Atypical chest pain    CAD (coronary artery disease)    a. Multiple prior evaluations then 05/2012 s/p BMS to OM2. b. Nuc 11/2012 wnl. c. Cath 2015: patent stent, moderate LAD/RCA dz, treated medically.   Cancer ([place])    Hx: of prostate   Cataract    CKD (chronic kidney disease), stage II    COPD (chronic obstructive pulmonary disease) (HCC)    Cough secondary to angiotensin converting enzyme inhibitor (ACE-I)    Diabetes mellitus without complication (HCC)    Diverticulosis    DVT (deep venous thrombosis) ([place])    Family history of breast cancer in first degree relative    GERD (gastroesophageal reflux disease)    Headache(784.0)    Hx: of   History of blood transfusion    "I've had 2; don't know what it was related to" (11/09/2012)   HLD (hyperlipidemia)    HTN (hypertension)    Iron deficiency anemia    Microcytic anemia    Peripheral vascular disease ([place])    a. L pop-tibial bypass 2011, followed by [facility].   Personal history of prostate cancer    Rotator cuff injury    "left arm; never repaired" (11/09/2012)   Past Surgical History:  Procedure Laterality Date  CARDIOVASCULAR STRESS TEST  Aug. 2014   CATARACT EXTRACTION W/ INTRAOCULAR LENS IMPLANT Right ~ 2008   CIRCUMCISION     COLONOSCOPY     CORONARY ANGIOPLASTY WITH STENT PLACEMENT  2014   "1" (11/09/2012)   ILIAC ARTERY ANEURYSM REPAIR     LEFT HEART CATH  05-31-12   LEFT HEART CATHETERIZATION WITH CORONARY ANGIOGRAM N/A 06/01/2011   Procedure: LEFT HEART CATHETERIZATION WITH CORONARY ANGIOGRAM;  Surgeon: Hillary Bow, MD;  Location: [facility] CATH LAB;  Service: Cardiovascular;  Laterality: N/A;   LEFT HEART  CATHETERIZATION WITH CORONARY ANGIOGRAM N/A 05/31/2012   Procedure: LEFT HEART CATHETERIZATION WITH CORONARY ANGIOGRAM;  Surgeon: Peter M Martinique, MD;  Location: [facility] CATH LAB;  Service: Cardiovascular;  Laterality: N/A;   LEFT HEART CATHETERIZATION WITH CORONARY ANGIOGRAM N/A 08/01/2013   Procedure: LEFT HEART CATHETERIZATION WITH CORONARY ANGIOGRAM;  Surgeon: Burnell Blanks, MD;  Location: [facility] CATH LAB;  Service: Cardiovascular;  Laterality: N/A;   PERCUTANEOUS CORONARY STENT INTERVENTION (PCI-S)  05/31/2012   Procedure: PERCUTANEOUS CORONARY STENT INTERVENTION (PCI-S);  Surgeon: Peter M Martinique, MD;  Location: [facility] CATH LAB;  Service: Cardiovascular;;   PR VEIN BYPASS GRAFT,AORTO-FEM-POP Left 10/01/2009   left below knee popliteal artery to posterior tibial artery   SHOULDER ARTHROSCOPY WITH OPEN ROTATOR CUFF REPAIR AND DISTAL CLAVICLE ACROMINECTOMY Left 01/12/2013   Procedure: LEFT SHOULDER ARTHROSCOPY WITH DEBRIDEMENT OPEN DISTAL CLAVICLE RESECTION ,acromioplastyAND ROTATOR CUFF REPAIR;  Surgeon: Yvette Rack., MD;  Location: [place];  Service: Orthopedics;  Laterality: Left;     Current Meds  Medication Sig   abiraterone Acetate (ZYTIGA) 250 MG tablet Take 1,000 mg by mouth daily before breakfast. Take on an empty stomach 1 hour before or 2 hours after a meal    albuterol (PROVENTIL HFA;VENTOLIN HFA) 108 (90 Base) MCG/ACT inhaler Inhale 2 puffs into the lungs every 6 (six) hours as needed for wheezing or shortness of breath.   amLODipine (NORVASC) 10 MG tablet Take 10 mg by mouth daily.   aspirin EC 81 MG tablet Take 81 mg by mouth daily.    atorvastatin (LIPITOR) 40 MG tablet Take 40 mg by mouth daily.   brimonidine (ALPHAGAN) 0.2 % ophthalmic solution Place 1 drop into the right eye 2 (two) times daily.    diclofenac sodium (VOLTAREN) 1 % GEL Apply 2 g topically 4 (four) times daily as needed (back pain).    ferrous sulfate 325 (65 FE) MG tablet Take 325 mg by mouth once a week.     fluticasone (FLONASE) 50 MCG/ACT nasal spray Place 2 sprays into both nostrils daily as needed for allergies or rhinitis.   gabapentin (NEURONTIN) 600 MG tablet Take 600 mg by mouth 2 (two) times daily.    isosorbide mononitrate (IMDUR) 30 MG 24 hr tablet Take 30 mg by mouth daily.   Lidocaine 5 % CREA Apply 1 application topically at bedtime as needed (back pain).   loratadine (CLARITIN) 10 MG tablet Take 10 mg by mouth daily.   losartan-hydrochlorothiazide (HYZAAR) 100-25 MG tablet TAKE 1 TABLET BY MOUTH DAILY   nitroGLYCERIN (NITROSTAT) 0.4 MG SL tablet Place 1 tablet (0.4 mg total) under the tongue every 5 (five) minutes as needed for chest pain.   omeprazole (PRILOSEC) 20 MG capsule Take 20 mg by mouth 2 (two) times daily before a meal.   predniSONE (DELTASONE) 5 MG tablet Take 5 mg by mouth daily before breakfast.    traMADol (ULTRAM) 50 MG tablet TAKE 1 TABLET(50 MG) BY  MOUTH EVERY 8 HOURS AS NEEDED FOR SEVERE PAIN     Allergies:   Topiramate; Atenolol; Lisinopril; and Tramadol-acetaminophen   Social History   Tobacco Use   Smoking status: Former Smoker    Packs/day: 0.50    Years: 52.00    Pack years: 26.00    Types: Cigarettes    Last attempt to quit: 05/26/2011    Years since quitting: 7.3   Smokeless tobacco: Never Used  Substance Use Topics   Alcohol use: No   Drug use: No     Family Hx: The patient's family history includes Breast cancer in his mother; COPD in his father; Cancer (age of onset: 24) in his brother; Cancer (age of onset: 61) in his brother; Hyperlipidemia in his father and mother; Hypertension in his brother, daughter, father, mother, sister, and son. There is no history of Colon cancer.  ROS:   Please see the history of present illness.     Review of Systems  Musculoskeletal: Positive for arthritis, back pain and joint pain.   All other systems reviewed and are negative.   Prior CV studies:   The following studies were reviewed  today: Nuclear stress test 11/29/18Study Highlights     Nuclear stress EF: 55%.  Moderate sided inferior defect that partially improves consistent with ischemia and possible soft tissue attenuation. Small region of anterior/anteroseptal ischemia. (base)  This is an intermediate risk study.          Cath 07/2013 Angiographic Findings:   Left main: No obstructive disease.     Left Anterior Descending Artery: Large caliber vessel that courses to the apex. The proximal and mid vessel is ectatic. There is diffuse 30-40% stenosis in the mid vessel. There are mild luminal irregularities in the distal vessel.     Circumflex Artery: Large caliber vessel with two obtuse marginal branches. The first OM branch is small in caliber and is chronically occluded in the mid vessel. The second OM branch is patent with patent stent, minimal restenosis. The distal segment of the OM branch has diffuse plaque.     Right Coronary Artery: Large caliber dominant vessel with proximal Shepherd's crook. The proximal vessel has diffuse 40% stenosis. The mid vessel is ectatic with 40% stenosis. The distal vessel has diffuse 50% stenosis. The PDA has 50% proximal stenosis followed by diffuse plaque.    Left Ventricular Angiogram: LVEF=65%.    Impression: 1. Triple vessel CAD with patent stent in the second OM branch.   2. Moderate non-obstructive disease in the LAD and RCA 3. Normal LV function   Recommendations: Continue medical management of CAD.               Labs/Other Tests and Data Reviewed:    EKG:  An ECG dated 09/08/18 was personally reviewed today and demonstrated:  NSR with first degree AV block  Recent Labs: 08/28/2018: ALT 17 09/08/2018: BUN 15; Creatinine, Ser 1.32; Hemoglobin 9.9; Platelets 198; Potassium 3.8; Sodium 141   Recent Lipid Panel Lab Results  Component Value Date/Time   CHOL 174 10/24/2017 08:22 AM   TRIG 135 10/24/2017 08:22 AM   HDL 47 10/24/2017 08:22 AM   CHOLHDL 3.7  10/24/2017 08:22 AM   CHOLHDL 5 02/06/2013 07:41 AM   LDLCALC 100 (H) 10/24/2017 08:22 AM    Wt Readings from Last 3 Encounters:  09/13/18 254 lb (115.2 kg)  09/11/18 254 lb (115.2 kg)  07/31/18 254 lb (115.2 kg)     Objective:    Vital  Signs:  BP 118/61    Pulse 70    Ht 5' 8.5" (1.74 m)    Wt 254 lb (115.2 kg)    BMI 38.06 kg/m    VITAL SIGNS:  reviewed GEN:  no acute distress RESPIRATORY:  normal respiratory effort, symmetric expansion CARDIOVASCULAR:  no peripheral edema  ASSESSMENT & PLAN:    1.  CAD S/P BMS OM2 with moderate LAD and RCA disease 2014, cath 2015 patent stent no change. Moderate risk lexiscan 2018 but no large areas of ischemia. 2. Chest pain with multiple ER visits over the years most recently 07/2018 & 08/2018 felt to be atypical and secondary to GERD or MS. Troponins negative and EKG unchanged 3. Essential HTN 4. HLD LDL 100 10/2017 on lipitor 40 mg daily-will need to get [facility]  COVID-19 Education: The signs and symptoms of COVID-19 were discussed with the patient and how to seek care for testing (follow up with PCP or arrange E-visit).  The importance of social distancing was discussed today.  Time:   Today, I have spent 16 face to face   minutes with the patient with telehealth technology discussing the above problems.  10 min reviewing his ER visits/chart and assisting with video visit   Medication Adjustments/Labs and Tests Ordered: Current medicines are reviewed at length with the patient today.  Concerns regarding medicines are outlined above.   Tests Ordered: Orders Placed This Encounter  Procedures   Hepatic function panel   Lipid panel    Medication Changes: No orders of the defined types were placed in this encounter.   Disposition:  Follow up in 6 month(s) Dr. Angelena Form  Signed, Ermalinda Barrios, PA-C  09/13/2018 2:35 PM    [place]

## 2018-09-14 ENCOUNTER — Telehealth: Payer: Self-pay | Admitting: *Deleted

## 2018-09-14 NOTE — Telephone Encounter (Signed)
    COVID-19 Pre-Screening Questions:  . In the past 7 to 10 days have you had a cough,  shortness of breath, headache, congestion, fever (100 or greater) body aches, chills, sore throat, or sudden loss of taste or sense of smell? . Have you been around anyone with known Covid 19. . Have you been around anyone who is awaiting Covid 19 test results in the past 7 to 10 days? . Have you been around anyone who has been exposed to Covid 19, or has mentioned symptoms of Covid 19 within the past 7 to 10 days?  If you have any concerns/questions about symptoms patients report during screening (either on the phone or at threshold). Contact the provider seeing the patient or DOD for further guidance.  If neither are available contact a member of the leadership team.           Contacted patient via telephone call got answering service. Instructed to return call before lab appt.KB

## 2018-09-15 ENCOUNTER — Other Ambulatory Visit: Payer: Medicare HMO

## 2018-09-15 ENCOUNTER — Emergency Department (HOSPITAL_COMMUNITY): Payer: Medicare HMO

## 2018-09-15 ENCOUNTER — Other Ambulatory Visit: Payer: Self-pay

## 2018-09-15 ENCOUNTER — Encounter (HOSPITAL_COMMUNITY): Payer: Self-pay

## 2018-09-15 ENCOUNTER — Emergency Department (HOSPITAL_COMMUNITY)
Admission: EM | Admit: 2018-09-15 | Discharge: 2018-09-15 | Disposition: A | Discharge status: Home / Self Care | Payer: Medicare HMO | Attending: Emergency Medicine | Admitting: Emergency Medicine

## 2018-09-15 DIAGNOSIS — Z7982 Long term (current) use of aspirin: Secondary | ICD-10-CM | POA: Insufficient documentation

## 2018-09-15 DIAGNOSIS — Z955 Presence of coronary angioplasty implant and graft: Secondary | ICD-10-CM | POA: Diagnosis not present

## 2018-09-15 DIAGNOSIS — Z79899 Other long term (current) drug therapy: Secondary | ICD-10-CM | POA: Diagnosis not present

## 2018-09-15 DIAGNOSIS — I700 Atherosclerosis of aorta: Secondary | ICD-10-CM | POA: Diagnosis not present

## 2018-09-15 DIAGNOSIS — I712 Thoracic aortic aneurysm, without rupture: Secondary | ICD-10-CM | POA: Diagnosis not present

## 2018-09-15 DIAGNOSIS — R072 Precordial pain: Secondary | ICD-10-CM | POA: Diagnosis not present

## 2018-09-15 DIAGNOSIS — R0789 Other chest pain: Secondary | ICD-10-CM

## 2018-09-15 DIAGNOSIS — Z20828 Contact with and (suspected) exposure to other viral communicable diseases: Secondary | ICD-10-CM | POA: Insufficient documentation

## 2018-09-15 DIAGNOSIS — I129 Hypertensive chronic kidney disease with stage 1 through stage 4 chronic kidney disease, or unspecified chronic kidney disease: Secondary | ICD-10-CM | POA: Insufficient documentation

## 2018-09-15 DIAGNOSIS — I2510 Atherosclerotic heart disease of native coronary artery without angina pectoris: Secondary | ICD-10-CM | POA: Diagnosis not present

## 2018-09-15 DIAGNOSIS — Z87891 Personal history of nicotine dependence: Secondary | ICD-10-CM | POA: Insufficient documentation

## 2018-09-15 DIAGNOSIS — Z8546 Personal history of malignant neoplasm of prostate: Secondary | ICD-10-CM | POA: Diagnosis not present

## 2018-09-15 DIAGNOSIS — R079 Chest pain, unspecified: Secondary | ICD-10-CM | POA: Diagnosis not present

## 2018-09-15 DIAGNOSIS — E1122 Type 2 diabetes mellitus with diabetic chronic kidney disease: Secondary | ICD-10-CM | POA: Diagnosis not present

## 2018-09-15 DIAGNOSIS — N182 Chronic kidney disease, stage 2 (mild): Secondary | ICD-10-CM | POA: Diagnosis not present

## 2018-09-15 DIAGNOSIS — J449 Chronic obstructive pulmonary disease, unspecified: Secondary | ICD-10-CM | POA: Diagnosis not present

## 2018-09-15 LAB — TROPONIN I
Troponin I: <0.03 ng/mL (ref <0.03)
Troponin I: <0.03 ng/mL (ref <0.03)

## 2018-09-15 LAB — CBC WITH DIFFERENTIAL/PLATELET
Abs Immature Granulocytes: 0.05 10*3/uL (ref 0.00–0.07)
Basophils Absolute: 0 10*3/uL (ref 0.0–0.1)
Basophils Relative: 1 %
Eosinophils Absolute: 0.3 10*3/uL (ref 0.0–0.5)
Eosinophils Relative: 6 %
HCT: 33.7 % — ABNORMAL LOW (ref 39.0–52.0)
Hemoglobin: 10.1 g/dL — ABNORMAL LOW (ref 13.0–17.0)
Immature Granulocytes: 1 %
Lymphocytes Relative: 22 %
Lymphs Abs: 1 10*3/uL (ref 0.7–4.0)
MCH: 21.1 pg — ABNORMAL LOW (ref 26.0–34.0)
MCHC: 30 g/dL (ref 30.0–36.0)
MCV: 70.5 fL — ABNORMAL LOW (ref 80.0–100.0)
Monocytes Absolute: 0.4 10*3/uL (ref 0.1–1.0)
Monocytes Relative: 9 %
Neutro Abs: 2.8 10*3/uL (ref 1.7–7.7)
Neutrophils Relative %: 61 %
Platelets: 185 10*3/uL (ref 150–400)
RBC: 4.78 MIL/uL (ref 4.22–5.81)
RDW: 17.3 % — ABNORMAL HIGH (ref 11.5–15.5)
WBC: 4.6 10*3/uL (ref 4.0–10.5)
nRBC: 0 % (ref 0.0–0.2)

## 2018-09-15 LAB — COMPREHENSIVE METABOLIC PANEL
ALT: 15 U/L (ref 0–44)
AST: 15 U/L (ref 15–41)
Albumin: 3.7 g/dL (ref 3.5–5.0)
Alkaline Phosphatase: 73 U/L (ref 38–126)
Anion gap: 13 (ref 5–15)
BUN: 15 mg/dL (ref 8–23)
CO2: 25 mmol/L (ref 22–32)
Calcium: 9.2 mg/dL (ref 8.9–10.3)
Chloride: 105 mmol/L (ref 98–111)
Creatinine, Ser: 1.21 mg/dL (ref 0.61–1.24)
GFR calc Af Amer: >60 mL/min (ref >60)
GFR calc non Af Amer: 57 mL/min — ABNORMAL LOW (ref >60)
Glucose, Bld: 145 mg/dL — ABNORMAL HIGH (ref 70–99)
Potassium: 3.4 mmol/L — ABNORMAL LOW (ref 3.5–5.1)
Sodium: 143 mmol/L (ref 135–145)
Total Bilirubin: 0.4 mg/dL (ref 0.3–1.2)
Total Protein: 6.8 g/dL (ref 6.5–8.1)

## 2018-09-15 LAB — D-DIMER, QUANTITATIVE: D-Dimer, Quant: 1.84 ug/mL-FEU — ABNORMAL HIGH (ref 0.00–0.50)

## 2018-09-15 LAB — LIPASE, BLOOD: Lipase: 40 U/L (ref 11–51)

## 2018-09-15 LAB — SARS CORONAVIRUS 2: SARS Coronavirus 2: NOT DETECTED

## 2018-09-15 MED ORDER — IOHEXOL 350 MG/ML SOLN
80.0000 mL | Freq: Once | INTRAVENOUS | Status: AC | PRN
  Administered 2018-09-15: 80 mL via INTRAVENOUS

## 2018-09-15 MED ORDER — ALUM & MAG HYDROXIDE-SIMETH 200-200-20 MG/5ML PO SUSP
15.0000 mL | Freq: Once | ORAL | Status: AC
  Administered 2018-09-15: 15 mL via ORAL
  Filled 2018-09-15: qty 30

## 2018-09-15 MED ORDER — POTASSIUM CHLORIDE CRYS ER 20 MEQ PO TBCR
40.0000 meq | EXTENDED_RELEASE_TABLET | Freq: Once | ORAL | Status: AC
  Administered 2018-09-15: 40 meq via ORAL
  Filled 2018-09-15: qty 2

## 2018-09-15 NOTE — ED Notes (Signed)
ED Provider at bedside. 

## 2018-09-15 NOTE — ED Notes (Signed)
Patient verbalizes understanding of discharge instructions. Opportunity for questioning and answers were provided. Armband removed by staff, pt discharged from ED.  

## 2018-09-15 NOTE — Discharge Instructions (Addendum)
It was our pleasure to provide your ER care today - we hope that you feel better.  Follow up with your cardiologist in the coming week - call office Monday AM to arrange appointment time.  Your ct scan was read as showing no acute process - incidental note was made of the following (please follow up with your doctor regarding these results): 1. The descending thoracic aorta is minimally enlarged measuring 3.1x 3.1 cm. Normal caliber of the ascending aorta. There is moderate mixed calcific atherosclerosis of the aorta, with irregular atherosclerosis of the lower thoracic aorta. No evidence of dissection, penetrating ulceration, or other acute aortic pathology. 2.  Coronary artery disease. 3. Multiple low-attenuation lesions of the liver included in the upper abdomen, some of which are fluid attenuation cysts or hemangiomas, other incompletely characterized.   From today's labs, your potassium level is slightly low (3.4) - eat plenty of fruits and vegetables, and follow up with primary care doctor.   Return to ER right away if worse, new symptoms, high fevers, recurrent or persistent chest pain, difficulty breathing, other concern.

## 2018-09-15 NOTE — ED Notes (Signed)
Pt returned from X-ray.  

## 2018-09-15 NOTE — ED Notes (Signed)
Patient transported to CT 

## 2018-09-15 NOTE — Consult Note (Signed)
ED Consult    Reason for Consult: Chest pain with concern for PACU Referring Physician: Dr. Tyrone Nine MRN #:  [id number]  History of Present Illness: This is a 78 y.o. male history of chest pain 2 weeks ago that was initially worked up he was sent home.  Now returns with the same.  Chest pain is anterior and sharp in nature.  Has not had any left arm pain.  No radiation.  No associated symptoms.  Denies any back pain.  No history of vascular invention has had coronary stents in the past.  Risk factors for vascular complications include diabetes and hypertension.  Past Medical History:  Diagnosis Date  . Atypical chest pain   . CAD (coronary artery disease)    a. Multiple prior evaluations then 05/2012 s/p BMS to OM2. b. Nuc 11/2012 wnl. c. Cath 2015: patent stent, moderate LAD/RCA dz, treated medically.  . Cancer ([place])    Hx: of prostate  . Cataract   . CKD (chronic kidney disease), stage II   . COPD (chronic obstructive pulmonary disease) ([place])   . Cough secondary to angiotensin converting enzyme inhibitor (ACE-I)   . Diabetes mellitus without complication ([place])   . Diverticulosis   . DVT (deep venous thrombosis) ([place])   . Family history of breast cancer in first degree relative   . GERD (gastroesophageal reflux disease)   . Headache(784.0)    Hx: of  . History of blood transfusion    "I've had 2; don't know what it was related to" (11/09/2012)  . HLD (hyperlipidemia)   . HTN (hypertension)   . Iron deficiency anemia   . Microcytic anemia   . Peripheral vascular disease ([place])    a. L pop-tibial bypass 2011, followed by [facility].  . Personal history of prostate cancer   . Rotator cuff injury    "left arm; never repaired" (11/09/2012)    Past Surgical History:  Procedure Laterality Date  . CARDIOVASCULAR STRESS TEST  Aug. 2014  . CATARACT EXTRACTION W/ INTRAOCULAR LENS IMPLANT Right ~ 2008  . CIRCUMCISION    . COLONOSCOPY    . CORONARY ANGIOPLASTY WITH STENT PLACEMENT  2014   "1"  (11/09/2012)  . ILIAC ARTERY ANEURYSM REPAIR    . LEFT HEART CATH  05-31-12  . LEFT HEART CATHETERIZATION WITH CORONARY ANGIOGRAM N/A 06/01/2011   Procedure: LEFT HEART CATHETERIZATION WITH CORONARY ANGIOGRAM;  Surgeon: Hillary Bow, MD;  Location: [facility] CATH LAB;  Service: Cardiovascular;  Laterality: N/A;  . LEFT HEART CATHETERIZATION WITH CORONARY ANGIOGRAM N/A 05/31/2012   Procedure: LEFT HEART CATHETERIZATION WITH CORONARY ANGIOGRAM;  Surgeon: Peter M Martinique, MD;  Location[facility] CATH LAB;  Service: Cardiovascular;  Laterality: N/A;  . LEFT HEART CATHETERIZATION WITH CORONARY ANGIOGRAM N/A 08/01/2013   Procedure: LEFT HEART CATHETERIZATION WITH CORONARY ANGIOGRAM;  Surgeon: Burnell Blanks, MD;  Location: [facility] CATH LAB;  Service: Cardiovascular;  Laterality: N/A;  . PERCUTANEOUS CORONARY STENT INTERVENTION (PCI-S)  05/31/2012   Procedure: PERCUTANEOUS CORONARY STENT INTERVENTION (PCI-S);  Surgeon: Peter M Martinique, MD;  Location: [facility] CATH LAB;  Service: Cardiovascular;;  . PR VEIN BYPASS GRAFT,AORTO-FEM-POP Left 10/01/2009   left below knee popliteal artery to posterior tibial artery  . SHOULDER ARTHROSCOPY WITH OPEN ROTATOR CUFF REPAIR AND DISTAL CLAVICLE ACROMINECTOMY Left 01/12/2013   Procedure: LEFT SHOULDER ARTHROSCOPY WITH DEBRIDEMENT OPEN DISTAL CLAVICLE RESECTION ,acromioplastyAND ROTATOR CUFF REPAIR;  Surgeon: Yvette Rack., MD;  Location: [place];  Service: Orthopedics;  Laterality: Left;    Allergies  Allergen Reactions  . Topiramate Shortness Of Breath and Other (See Comments)     leg cramps  . Atenolol Other (See Comments)    bradycardia  . Lisinopril Cough  . Tramadol-Acetaminophen Cough    Tolerates plain Tramadol    Prior to Admission medications   Medication Sig Start Date End Date Taking? Authorizing Provider  abiraterone Acetate (ZYTIGA) 250 MG tablet Take 1,000 mg by mouth daily before breakfast. Take on an empty stomach 1 hour before or 2 hours after a meal    Yes  [provider]  albuterol (PROVENTIL HFA;VENTOLIN HFA) 108 (90 Base) MCG/ACT inhaler Inhale 2 puffs into the lungs every 6 (six) hours as needed for wheezing or shortness of breath. 07/04/18  Yes Henson, Vickie L, NP-C  amLODipine (NORVASC) 10 MG tablet Take 10 mg by mouth daily.   Yes [provider]  aspirin EC 81 MG tablet Take 81 mg by mouth daily.    Yes [provider]  atorvastatin (LIPITOR) 40 MG tablet Take 40 mg by mouth daily.   Yes [provider]  brimonidine (ALPHAGAN) 0.2 % ophthalmic solution Place 1 drop into the right eye 2 (two) times daily.  09/13/17  Yes [provider]  diclofenac sodium (VOLTAREN) 1 % GEL Apply 2 g topically 4 (four) times daily as needed (back pain).    Yes [provider]  ferrous sulfate 325 (65 FE) MG tablet Take 325 mg by mouth once a week.    Yes [provider]  fluticasone (FLONASE) 50 MCG/ACT nasal spray Place 2 sprays into both nostrils daily as needed for allergies or rhinitis.   Yes [provider]  gabapentin (NEURONTIN) 600 MG tablet Take 600 mg by mouth 2 (two) times daily.    Yes [provider]  isosorbide mononitrate (IMDUR) 30 MG 24 hr tablet Take 30 mg by mouth daily.   Yes [provider]  Lidocaine 5 % CREA Apply 1 application topically at bedtime as needed (back pain).   Yes [provider]  loratadine (CLARITIN) 10 MG tablet Take 10 mg by mouth daily.   Yes [provider]  losartan-hydrochlorothiazide (HYZAAR) 100-25 MG tablet TAKE 1 TABLET BY MOUTH DAILY 07/24/18  Yes Henson, Vickie L, NP-C  nitroGLYCERIN (NITROSTAT) 0.4 MG SL tablet Place 1 tablet (0.4 mg total) under the tongue every 5 (five) minutes as needed for chest pain. 04/27/18  Yes Rosita Fire, Brittainy M, PA-C  omeprazole (PRILOSEC) 20 MG capsule Take 20 mg by mouth 2 (two) times daily before a meal.   Yes [provider]  predniSONE (DELTASONE) 5 MG tablet Take 5 mg  by mouth daily before breakfast.    Yes [provider]  traMADol (ULTRAM) 50 MG tablet TAKE 1 TABLET(50 MG) BY MOUTH EVERY 8 HOURS AS NEEDED FOR SEVERE PAIN Patient taking differently: Take 50 mg by mouth every 8 (eight) hours as needed for severe pain.  09/11/18  Yes Henson, Laurian Brim, NP-C    Social History   Socioeconomic History  . Marital status: Widowed    Spouse name: Not on file  . Number of children: Not on file  . Years of education: Not on file  . Highest education level: 12th grade  Occupational History  . Occupation: Retired  Scientific laboratory technician  . Financial resource strain: Not on file  . Food insecurity:    Worry: Not on file    Inability: Not on file  . Transportation needs:    Medical:  Not on file    Non-medical: Not on file  Tobacco Use  . Smoking status: Former Smoker    Packs/day: 0.50    Years: 52.00    Pack years: 26.00    Types: Cigarettes    Last attempt to quit: 05/26/2011    Years since quitting: 7.3  . Smokeless tobacco: Never Used  Substance and Sexual Activity  . Alcohol use: No  . Drug use: No  . Sexual activity: Not on file  Lifestyle  . Physical activity:    Days per week: Not on file    Minutes per session: Not on file  . Stress: Not on file  Relationships  . Social connections:    Talks on phone: Not on file    Gets together: Not on file    Attends religious service: Not on file    Active member of club or organization: Not on file    Attends meetings of clubs or organizations: Not on file    Relationship status: Not on file  . Intimate partner violence:    Fear of current or ex partner: Not on file    Emotionally abused: Not on file    Physically abused: Not on file    Forced sexual activity: Not on file  Other Topics Concern  . 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  Lives in [place] by himself.      Patient writes with his right hand, though uses his left hand for every thing  else. He lives alone in a one level home. He drinks 3 cups of coffee a day and 3 7-8 oz sodas a day. He does not exercise.     Family History  Problem Relation Age of Onset  . Breast cancer Mother        dx 68s  . Hyperlipidemia Mother   . Hypertension Mother   . COPD Father        deceased  . Hyperlipidemia Father   . Hypertension Father   . Hypertension Sister   . Cancer Brother 32       Unknown cancer, possibly stomach  . Hypertension Brother   . Cancer Brother 3       Unknown, possibly lung  . Hypertension Daughter   . Hypertension Son   . Colon cancer Neg Hx        pt is unclear about family hx    ROS:  Cardiovascular: [x]  chest pain/pressure []  palpitations []  SOB lying flat []  DOE []  pain in legs while walking []  pain in legs at rest []  pain in legs at night []  non-healing ulcers []  hx of DVT []  swelling in legs  Pulmonary: []  productive cough []  asthma/wheezing []  home O2  Neurologic: []  weakness in []  arms []  legs []  numbness in []  arms []  legs []  hx of CVA []  mini stroke [] difficulty speaking or slurred speech []  temporary loss of vision in one eye []  dizziness  Hematologic: []  hx of cancer []  bleeding problems []  problems with blood clotting easily  Endocrine:   []  diabetes []  thyroid disease  GI []  vomiting blood []  blood in stool  GU: []  CKD/renal failure []  HD--[]  M/W/F or []  T/T/S []  burning with urination []  blood in urine  Psychiatric: []  anxiety []  depression  Musculoskeletal: []  arthritis []  joint pain  Integumentary: []  rashes []  ulcers  Constitutional: []  fever []  chills   Physical Examination  Vitals:  09/15/18 1200 09/15/18 1503  BP: 107/72 131/70  Pulse: 72 65  Resp: 17 16  Temp:    SpO2: 97% 99%   Body mass index is 38.06 kg/m.  General:  WDWN in NAD HENT: WNL, normocephalic Pulmonary: normal non-labored breathing Cardiac: Palpable bilateral radial, common femoral, popliteal pulses Abdomen:   soft, NT/ND, no masses Extremities: Well perfused, no edema Musculoskeletal: no muscle wasting or atrophy  Neurologic: A&O X 3; Appropriate Affect ; SENSATION: normal; MOTOR FUNCTION:  moving all extremities equally. Speech is fluent/normal   CBC    Component Value Date/Time   WBC 4.6 09/15/2018 0742   RBC 4.78 09/15/2018 0742   HGB 10.1 (L) 09/15/2018 0742   HGB 11.3 (L) 08/24/2017 1052   HGB 11.3 (L) 11/12/2016 0912   HCT 33.7 (L) 09/15/2018 0742   HCT 36.1 (L) 08/24/2017 1052   HCT 35.7 (L) 11/12/2016 0912   PLT 185 09/15/2018 0742   PLT 153 08/24/2017 1052   MCV 70.5 (L) 09/15/2018 0742   MCV 71 (L) 08/24/2017 1052   MCV 69.2 (L) 11/12/2016 0912   MCH 21.1 (L) 09/15/2018 0742   MCHC 30.0 09/15/2018 0742   RDW 17.3 (H) 09/15/2018 0742   RDW 16.8 (H) 08/24/2017 1052   RDW 17.6 (H) 11/12/2016 0912   LYMPHSABS 1.0 09/15/2018 0742   LYMPHSABS 1.1 08/24/2017 1052   LYMPHSABS 0.8 (L) 11/12/2016 0912   MONOABS 0.4 09/15/2018 0742   MONOABS 0.4 11/12/2016 0912   EOSABS 0.3 09/15/2018 0742   EOSABS 0.2 08/24/2017 1052   BASOSABS 0.0 09/15/2018 0742   BASOSABS 0.0 08/24/2017 1052   BASOSABS 0.1 11/12/2016 0912    BMET    Component Value Date/Time   NA 143 09/15/2018 0742   NA 143 08/24/2017 1052   NA 142 11/12/2016 0912   K 3.4 (L) 09/15/2018 0742   K 4.1 11/12/2016 0912   CL 105 09/15/2018 0742   CO2 25 09/15/2018 0742   CO2 26 11/12/2016 0912   GLUCOSE 145 (H) 09/15/2018 0742   GLUCOSE 110 11/12/2016 0912   BUN 15 09/15/2018 0742   BUN 14 08/24/2017 1052   BUN 14.3 11/12/2016 0912   CREATININE 1.21 09/15/2018 0742   CREATININE 1.3 11/12/2016 0912   CALCIUM 9.2 09/15/2018 0742   CALCIUM 9.6 11/12/2016 0912   GFRNONAA 57 (L) 09/15/2018 0742   GFRAA >60 09/15/2018 0742    COAGS: Lab Results  Component Value Date   INR 1.0 08/28/2018   INR 1.01 06/12/2015   INR 1.15 12/20/2014     Non-Invasive Vascular Imaging:   IMPRESSION: 1. The descending thoracic  aorta is minimally enlarged measuring 3.1 x 3.1 cm. Normal caliber of the ascending aorta. There is moderate mixed calcific atherosclerosis of the aorta, with irregular atherosclerosis of the lower thoracic aorta. No evidence of dissection, penetrating ulceration, or other acute aortic pathology.  2.  Coronary artery disease.  3. Multiple low-attenuation lesions of the liver included in the upper abdomen, some of which are fluid attenuation cysts or hemangiomas, other incompletely characterized.    ASSESSMENT/PLAN: This is a 78 y.o. male here for evaluation of chest pain.  Initial CT demonstrated possible PAU repeat CT scan is not impressive for any significant vascular disease.  Does not need vascular invention or specific follow-up at this time.  Elizjah Noblet C. Donzetta Matters, MD [facility] Office: [phone] Pager: [phone]

## 2018-09-15 NOTE — ED Notes (Signed)
Patient transported to X-ray 

## 2018-09-15 NOTE — ED Triage Notes (Signed)
Pt arrives POV from home c/o chest pain that started last night. Pt states pain is on the left side of his chest and is a 8/10; pt states he's had similar pain 2 weeks ago; pt has hx of stents; states he did not take nitro for today's pain.

## 2018-09-15 NOTE — ED Provider Notes (Signed)
Signed out by Dr Tyrone Nine at 1500, that he has spoken with cardiology, and plan is to d/c to home if/when the repeat troponin returns negative.  Delta troponin is normal. Patients vital signs are normal. Pt currently denies chest pain or discomfort, no sob, afebrile. Pt appears stable for d/c.   Rec outpt f/u.  Return precautions provided.     Lajean Saver, MD 09/15/18 1539

## 2018-09-15 NOTE — ED Provider Notes (Signed)
[facility] EMERGENCY DEPARTMENT Provider Note   CSN: [id number] Arrival date & time: 09/15/18  0705    History   Chief Complaint Chief Complaint  Patient presents with  . Chest Pain    HPI Joel [name] is a 78 y.o. male.     78 yo M with a chief complaints of chest pain.  This is left-sided and sharp.  He states it feels like it did about 2 weeks ago when he came to the emergency department for evaluation.  At that time he had 2- troponins his symptoms also improved significantly after belching.  The patient has had no belching with this episode.  Said it started last night about 10 PM.  Described it sharpest at times.  Denies shortness of breath denies diaphoresis denies nausea or vomiting.  Denies radiation of the pain.  Denies abdominal pain denies cough congestion or fever.  Denies history of PE or DVT.  Denies recent hospitalization unilateral lower extremity edema.  He does have active prostate cancer that is getting treated with oral chemotherapy.  The history is provided by the patient.  Chest Pain  Pain location:  L chest Pain quality: aching and sharp   Pain radiates to:  Does not radiate Pain severity:  Moderate Onset quality:  Gradual Duration:  12 hours Timing:  Constant Progression:  Unchanged Chronicity:  New Relieved by:  Nothing Worsened by:  Nothing Ineffective treatments:  None tried Associated symptoms: no abdominal pain, no fever, no headache, no palpitations, no shortness of breath and no vomiting     Past Medical History:  Diagnosis Date  . Atypical chest pain   . CAD (coronary artery disease)    a. Multiple prior evaluations then 05/2012 s/p BMS to OM2. b. Nuc 11/2012 wnl. c. Cath 2015: patent stent, moderate LAD/RCA dz, treated medically.  . Cancer ([place])    Hx: of prostate  . Cataract   . CKD (chronic kidney disease), stage II   . COPD (chronic obstructive pulmonary disease) ([place])   . Cough secondary to angiotensin  converting enzyme inhibitor (ACE-I)   . Diabetes mellitus without complication ([place])   . Diverticulosis   . DVT (deep venous thrombosis) ([place])   . Family history of breast cancer in first degree relative   . GERD (gastroesophageal reflux disease)   . Headache(784.0)    Hx: of  . History of blood transfusion    "I've had 2; don't know what it was related to" (11/09/2012)  . HLD (hyperlipidemia)   . HTN (hypertension)   . Iron deficiency anemia   . Microcytic anemia   . Peripheral vascular disease ([place])    a. L pop-tibial bypass 2011, followed by [facility].  . Personal history of prostate cancer   . Rotator cuff injury    "left arm; never repaired" (11/09/2012)    Patient Active Problem List   Diagnosis Date Noted  . Chronic low back pain 09/11/2018  . COPD (chronic obstructive pulmonary disease) ([place])   . Seasonal allergies 07/04/2018  . Chronic obstructive pulmonary disease ([place]) 07/04/2018  . Dizziness 01/20/2018  . Malaise 01/20/2018  . Decreased breath sounds 01/20/2018  . Needs flu shot 01/20/2018  . Chronic nonintractable headache 12/07/2017  . Back pain 11/01/2017  . Radicular pain of lower extremity 11/01/2017  . Prostate cancer ([place]) 11/01/2017  . Family history of breast cancer in first degree relative   . Personal history of prostate cancer   . Elliptocytosis on peripheral blood smear (  [place]) 09/13/2016  . Prediabetes 03/22/2016  . PVD (peripheral vascular disease) with claudication ([place]) 11/15/2012  . Chest pain, localized 11/10/2012  . Hypokalemia 11/10/2012  . Bradycardia 11/09/2012  . Thrombocytopenia ([place]) 06/01/2012  . Microcytic anemia   . Atherosclerosis of native arteries of the extremities with intermittent claudication 06/16/2011  . Stable angina ([place]) 06/11/2011  . CAD (coronary artery disease)   . Chest pain, atypical 05/30/2011  . Iron deficiency anemia 10/03/2009  . DIVERTICULOSIS OF COLON 10/03/2009  . WEIGHT LOSS, ABNORMAL 10/03/2009  . CHEST PAIN  UNSPECIFIED 09/13/2008  . Headache(784.0) 08/19/2008  . TIA 06/05/2008  . SHOULDER PAIN, LEFT 10/11/2007  . COUGH DUE TO ACE INHIBITORS 05/07/2007  . History of tobacco use disorder 04/25/2007  . Hyperlipidemia 04/21/2007  . Essential hypertension 04/21/2007  . GERD 04/21/2007    Past Surgical History:  Procedure Laterality Date  . CARDIOVASCULAR STRESS TEST  Aug. 2014  . CATARACT EXTRACTION W/ INTRAOCULAR LENS IMPLANT Right ~ 2008  . CIRCUMCISION    . COLONOSCOPY    . CORONARY ANGIOPLASTY WITH STENT PLACEMENT  2014   "1" (11/09/2012)  . ILIAC ARTERY ANEURYSM REPAIR    . LEFT HEART CATH  05-31-12  . LEFT HEART CATHETERIZATION WITH CORONARY ANGIOGRAM N/A 06/01/2011   Procedure: LEFT HEART CATHETERIZATION WITH CORONARY ANGIOGRAM;  Surgeon: Hillary Bow, MD;  Location[facility] C[facility] LAB;  Service: Cardiovascular;  Laterality: N/A;  . LEFT HEART CATHETERIZATION WITH CORONARY ANGIOGRAM N/A 05/31/2012   Procedure: LEFT HEART CATHETERIZATION WITH CORONARY ANGIOGRAM;  Surgeon: Peter M Martinique, MD;  Location: [facility] CATH LAB;  Service: Cardiovascular;  Laterality: N/A;  . LEFT HEART CATHETERIZATION WITH CORONARY ANGIOGRAM N/A 08/01/2013   Procedure: LEFT HEART CATHETERIZATION WITH CORONARY ANGIOGRAM;  Surgeon: Burnell Blanks, MD;  Location: [facility] CATH LAB;  Service: Cardiovascular;  Laterality: N/A;  . PERCUTANEOUS CORONARY STENT INTERVENTION (PCI-S)  05/31/2012   Procedure: PERCUTANEOUS CORONARY STENT INTERVENTION (PCI-S);  Surgeon: Peter M Martinique, MD;  Location: [facility] CATH LAB;  Service: Cardiovascular;;  . PR VEIN BYPASS GRAFT,AORTO-FEM-POP Left 10/01/2009   left below knee popliteal artery to posterior tibial artery  . SHOULDER ARTHROSCOPY WITH OPEN ROTATOR CUFF REPAIR AND DISTAL CLAVICLE ACROMINECTOMY Left 01/12/2013   Procedure: LEFT SHOULDER ARTHROSCOPY WITH DEBRIDEMENT OPEN DISTAL CLAVICLE RESECTION ,acromioplastyAND ROTATOR CUFF REPAIR;  Surgeon: Yvette Rack., MD;  Location: [facility];  Service:  Orthopedics;  Laterality: Left;        Home Medications    Prior to Admission medications   Medication Sig Start Date End Date Taking? Authorizing Provider  abiraterone Acetate (ZYTIGA) 250 MG tablet Take 1,000 mg by mouth daily before breakfast. Take on an empty stomach 1 hour before or 2 hours after a meal    Yes [provider]  albuterol (PROVENTIL HFA;VENTOLIN HFA) 108 (90 Base) MCG/ACT inhaler Inhale 2 puffs into the lungs every 6 (six) hours as needed for wheezing or shortness of breath. 07/04/18  Yes Henson, Vickie L, NP-C  amLODipine (NORVASC) 10 MG tablet Take 10 mg by mouth daily.   Yes [provider]  aspirin EC 81 MG tablet Take 81 mg by mouth daily.    Yes [provider]  atorvastatin (LIPITOR) 40 MG tablet Take 40 mg by mouth daily.   Yes [provider]  brimonidine (ALPHAGAN) 0.2 % ophthalmic solution Place 1 drop into the right eye 2 (two) times daily.  09/13/17  Yes [provider]  diclofenac sodium (VOLTAREN) 1 % GEL Apply  2 g topically 4 (four) times daily as needed (back pain).    Yes [provider]  ferrous sulfate 325 (65 FE) MG tablet Take 325 mg by mouth once a week.    Yes [provider]  fluticasone (FLONASE) 50 MCG/ACT nasal spray Place 2 sprays into both nostrils daily as needed for allergies or rhinitis.   Yes [provider]  gabapentin (NEURONTIN) 600 MG tablet Take 600 mg by mouth 2 (two) times daily.    Yes [provider]  isosorbide mononitrate (IMDUR) 30 MG 24 hr tablet Take 30 mg by mouth daily.   Yes [provider]  Lidocaine 5 % CREA Apply 1 application topically at bedtime as needed (back pain).   Yes [provider]  loratadine (CLARITIN) 10 MG tablet Take 10 mg by mouth daily.   Yes [provider]  losartan-hydrochlorothiazide (HYZAAR) 100-25 MG tablet TAKE 1 TABLET BY MOUTH DAILY 07/24/18  Yes Henson, Vickie L, NP-C  nitroGLYCERIN  (NITROSTAT) 0.4 MG SL tablet Place 1 tablet (0.4 mg total) under the tongue every 5 (five) minutes as needed for chest pain. 04/27/18  Yes Rosita Fire, Brittainy M, PA-C  omeprazole (PRILOSEC) 20 MG capsule Take 20 mg by mouth 2 (two) times daily before a meal.   Yes [provider]  predniSONE (DELTASONE) 5 MG tablet Take 5 mg by mouth daily before breakfast.    Yes [provider]  traMADol (ULTRAM) 50 MG tablet TAKE 1 TABLET(50 MG) BY MOUTH EVERY 8 HOURS AS NEEDED FOR SEVERE PAIN Patient taking differently: Take 50 mg by mouth every 8 (eight) hours as needed for severe pain.  09/11/18  Yes Henson, Vickie L, NP-C    Family History Family History  Problem Relation Age of Onset  . Breast cancer Mother        dx 19s  . Hyperlipidemia Mother   . Hypertension Mother   . COPD Father        deceased  . Hyperlipidemia Father   . Hypertension Father   . Hypertension Sister   . Cancer Brother 52       Unknown cancer, possibly stomach  . Hypertension Brother   . Cancer Brother 23       Unknown, possibly lung  . Hypertension Daughter   . Hypertension Son   . Colon cancer Neg Hx        pt is unclear about family hx    Social History Social History   Tobacco Use  . Smoking status: Former Smoker    Packs/day: 0.50    Years: 52.00    Pack years: 26.00    Types: Cigarettes    Last attempt to quit: 05/26/2011    Years since quitting: 7.3  . Smokeless tobacco: Never Used  Substance Use Topics  . Alcohol use: No  . Drug use: No     Allergies   Topiramate; Atenolol; Lisinopril; and Tramadol-acetaminophen   Review of Systems Review of Systems  Constitutional: Negative for chills and fever.  HENT: Negative for congestion and facial swelling.   Eyes: Negative for discharge and visual disturbance.  Respiratory: Negative for shortness of breath.   Cardiovascular: Positive for chest pain. Negative for palpitations.  Gastrointestinal: Negative for abdominal pain, diarrhea  and vomiting.  Musculoskeletal: Negative for arthralgias and myalgias.  Skin: Negative for color change and rash.  Neurological: Negative for tremors, syncope and headaches.  Psychiatric/Behavioral: Negative for confusion and dysphoric mood.     Physical Exam Updated  Vital Signs BP 107/72   Pulse 72   Temp 98.3 F (36.8 C) (Oral)   Resp 17   Ht 5' 8.5" (1.74 m)   Wt 115.2 kg   SpO2 97%   BMI 38.06 kg/m   Physical Exam Vitals signs and nursing note reviewed.  Constitutional:      Appearance: He is well-developed. He is obese.  HENT:     Head: Normocephalic and atraumatic.  Eyes:     Pupils: Pupils are equal, round, and reactive to light.  Neck:     Musculoskeletal: Normal range of motion and neck supple.     Vascular: No JVD.  Cardiovascular:     Rate and Rhythm: Normal rate and regular rhythm.     Heart sounds: No murmur. No friction rub. No gallop.   Pulmonary:     Effort: No respiratory distress.     Breath sounds: No wheezing.  Abdominal:     General: There is no distension.     Tenderness: There is no guarding or rebound.  Musculoskeletal: Normal range of motion.  Skin:    Coloration: Skin is not pale.     Findings: No rash.  Neurological:     Mental Status: He is alert and oriented to person, place, and time.  Psychiatric:        Behavior: Behavior normal.      ED Treatments / Results  Labs (all labs ordered are listed, but only abnormal results are displayed) Labs Reviewed  CBC WITH DIFFERENTIAL/PLATELET - Abnormal; Notable for the following components:      Result Value   Hemoglobin 10.1 (*)    HCT 33.7 (*)    MCV 70.5 (*)    MCH 21.1 (*)    RDW 17.3 (*)    All other components within normal limits  COMPREHENSIVE METABOLIC PANEL - Abnormal; Notable for the following components:   Potassium 3.4 (*)    Glucose, Bld 145 (*)    GFR calc non Af Amer 57 (*)    All other components within normal limits  D-DIMER, QUANTITATIVE (NOT AT [facility]) -  Abnormal; Notable for the following components:   D-Dimer, Quant 1.84 (*)    All other components within normal limits  SARS CORONAVIRUS 2  LIPASE, BLOOD  TROPONIN I  TROPONIN I    EKG EKG Interpretation  Date/Time:  Friday September 15 2018 07:14:06 EDT Ventricular Rate:  77 PR Interval:    QRS Duration: 101 QT Interval:  382 QTC Calculation: 433 R Axis:   60 Text Interpretation:  Normal sinus rhythm Posterior infarct, old Otherwise no significant change Confirmed by Deno Etienne [phone]) on 09/15/2018 7:15:08 AM   Radiology Dg Chest 2 View  Result Date: 09/15/2018 CLINICAL DATA:  Two episodes of chest pain in the past 2 weeks. EXAM: CHEST - 2 VIEW COMPARISON:  09/08/2018. FINDINGS: Normal sized heart. Tortuous and partially calcified thoracic aorta. Clear lungs. Thoracic spine degenerative changes. Small diaphragmatic eventrations. IMPRESSION: No acute abnormality. Electronically Signed   By: Claudie Revering M.D.   On: 09/15/2018 08:18   Ct Angio Chest Pe W And/or Wo Contrast  Result Date: 09/15/2018 CLINICAL DATA:  Intermediate probability for pulmonary embolism. Chest pain. EXAM: CT ANGIOGRAPHY CHEST WITH CONTRAST TECHNIQUE: Multidetector CT imaging of the chest was performed using the standard protocol during bolus administration of intravenous contrast. Multiplanar CT image reconstructions and MIPs were obtained to evaluate the vascular anatomy. CONTRAST:  9mL OMNIPAQUE IOHEXOL 350 MG/ML SOLN COMPARISON:  09/15/2011 FINDINGS:  Cardiovascular: When accounting for areas of motion artifact and streak artifact there is no convincing pulmonary embolism. Normal heart size. No pericardial effusion. Aortic and coronary atherosclerosis. Mediastinum/Nodes: Serpiginous structures in the lower posterior mediastinum are primarily attributed to veins. There are also generous sized lymph nodes at the aortic hiatus. The esophagus is not appear thickened. No gross mass lesion is seen. Small sliding hiatal hernia.  There are be further workup, as recommended below. Lungs/Pleura: Mild generalized airway thickening. There is no edema, consolidation, effusion, or pneumothorax. Upper Abdomen: Cystic densities in the right liver. Musculoskeletal: Generalized spondylosis. No fracture or erosion to explain the mild lower and posterior mediastinal stranding. These results were called by telephone at the time of interpretation on 09/15/2018 at 10:47 am to Dr. Deno Etienne , who verbally acknowledged these results. Review of the MIP images confirms the above findings. IMPRESSION: 1. Indeterminate 3 x 1.6 cm nodule in the posterior mediastinum with broad contact along the aorta. This could be a penetrating ulcer or adenopathy (there are other generous sized lymph nodes in this area). Recommend follow up CTA of the aorta. 2. Negative for pulmonary embolism. Electronically Signed   By: Monte Fantasia M.D.   On: 09/15/2018 10:48   Ct Angio Chest Aorta W/cm &/or Wo/cm  Result Date: 09/15/2018 CLINICAL DATA:  Aorta evaluation, connective tissue disease, PAU suspected EXAM: CT ANGIOGRAPHY CHEST WITH CONTRAST TECHNIQUE: Multidetector CT imaging of the chest was performed using the standard protocol during bolus administration of intravenous contrast. Multiplanar CT image reconstructions and MIPs were obtained to evaluate the vascular anatomy. CONTRAST:  21mL OMNIPAQUE IOHEXOL 350 MG/ML SOLN COMPARISON:  None. FINDINGS: Cardiovascular: Preferential opacification of the thoracic aorta. The descending thoracic aorta is minimally enlarged measuring 3.1 x 3.1 cm. Normal caliber of the ascending aorta. There is moderate mixed calcific atherosclerosis of the aorta, with irregular atherosclerosis of the lower thoracic aorta. No evidence of dissection, penetrating ulceration, or other acute aortic pathology. Cardiomegaly. Three-vessel coronary artery calcifications. No pericardial effusion. Mediastinum/Nodes: No enlarged mediastinal, hilar, or axillary  lymph nodes. Thyroid gland, trachea, and esophagus demonstrate no significant findings. Lungs/Pleura: Lungs are clear. No pleural effusion or pneumothorax. Upper Abdomen: No acute abnormality. Multiple low-attenuation lesions of the liver, some of which are fluid attenuation cysts or hemangiomas, other incompletely characterized. Musculoskeletal: No chest wall abnormality. No acute or significant osseous findings. Review of the MIP images confirms the above findings. IMPRESSION: 1. The descending thoracic aorta is minimally enlarged measuring 3.1 x 3.1 cm. Normal caliber of the ascending aorta. There is moderate mixed calcific atherosclerosis of the aorta, with irregular atherosclerosis of the lower thoracic aorta. No evidence of dissection, penetrating ulceration, or other acute aortic pathology. 2.  Coronary artery disease. 3. Multiple low-attenuation lesions of the liver included in the upper abdomen, some of which are fluid attenuation cysts or hemangiomas, other incompletely characterized. Electronically Signed   By: Eddie Candle M.D.   On: 09/15/2018 12:45    Procedures Procedures (including critical care time)  Medications Ordered in ED Medications  alum & mag hydroxide-simeth (MAALOX/MYLANTA) 200-200-20 MG/5ML suspension 15 mL (15 mLs Oral Given 09/15/18 0814)  iohexol (OMNIPAQUE) 350 MG/ML injection 80 mL (80 mLs Intravenous Contrast Given 09/15/18 1009)  iohexol (OMNIPAQUE) 350 MG/ML injection 80 mL (80 mLs Intravenous Contrast Given 09/15/18 1236)     Initial Impression / Assessment and Plan / ED Course  I have reviewed the triage vital signs and the nursing notes.  Pertinent labs & imaging results that  were available during my care of the patient were reviewed by me and considered in my medical decision making (see chart for details).        78 yo M with a chief complaint of left-sided chest pain.  Described as sharp.  Feels that was the same as it was couple weeks ago.  Started about 10  PM last night.  Not exertional no shortness of breath associated with it.  Saw his cardiologist via video visit yesterday.  Will obtain a laboratory work-up.  Chest x-ray.  His d-dimer was elevated.  CT angiogram of the chest without pulmonary embolism however there was some concern for possible penetrating ulcer of the aorta.  Will discuss this with vascular surgery.  Initial troponin is negative EKG with no obvious concerning change.  The patient was evaluated by vascular surgery and they recommended to CT scan of the aorta.  This was negative for acute pathology.  They felt that he was cleared from the possibility of a penetrating aortic ulcer.  I discussed the case with Dr. Angelena Form, cardiology he sees the patient personally and he felt that with atypical symptoms and a normal EKG he would be best benefited kept out of the hospital if the troponin was negative.  Patient care was signed out to Dr. Ashok Cordia please see his note for further details care.  The patients results and plan were reviewed and discussed.   Any x-rays performed were independently reviewed by myself.   Differential diagnosis were considered with the presenting HPI.  Medications  alum & mag hydroxide-simeth (MAALOX/MYLANTA) 200-200-20 MG/5ML suspension 15 mL (15 mLs Oral Given 09/15/18 0814)  iohexol (OMNIPAQUE) 350 MG/ML injection 80 mL (80 mLs Intravenous Contrast Given 09/15/18 1009)  iohexol (OMNIPAQUE) 350 MG/ML injection 80 mL (80 mLs Intravenous Contrast Given 09/15/18 1236)    Vitals:   09/15/18 0945 09/15/18 1127 09/15/18 1145 09/15/18 1200  BP: 123/72 124/64 100/85 107/72  Pulse: 64 63 64 72  Resp: 12 14 11 17   Temp:      TempSrc:      SpO2: 97% 96% 96% 97%  Weight:      Height:        Final diagnoses:  Atypical chest pain    Admission/ observation were discussed with the admitting physician, patient and/or family and they are comfortable with the plan.   Final Clinical Impressions(s) / ED Diagnoses    Final diagnoses:  Atypical chest pain    ED Discharge Orders    None       Deno Etienne, DO 09/15/18 1512

## 2018-09-18 ENCOUNTER — Telehealth: Payer: Self-pay | Admitting: Cardiovascular Disease

## 2018-09-18 NOTE — Telephone Encounter (Signed)
Left message for patient to call back  

## 2018-09-18 NOTE — Telephone Encounter (Signed)
Follow up: ° ° ° °Patient returning your call back.  °

## 2018-09-18 NOTE — Telephone Encounter (Signed)
New Message     Patient had virtual visit with Joel [name] 6/3 and then went to ER on 6/8 and was told to make an appt with his cardiologist.  Being as the patient just had a virtual visit we want to know should he have another one or should he come into the office.

## 2018-09-19 NOTE — Telephone Encounter (Signed)
Patient calling back. He states that he has continued to have intermittent episodes of chest pain that goes up into his shoulder. He states that he was in the ER recently and they told him to reach out to cardiology. Patient requesting an in office visit only. Appointment scheduled with Ermalinda Barrios, PA on 6/10. Patient answers NO to all COVID screening questions below and will wear his mask to his appt.       COVID-19 Pre-Screening Questions:  . In the past 7 to 10 days have you had a cough,  shortness of breath, headache, congestion, fever (100 or greater) body aches, chills, sore throat, or sudden loss of taste or sense of smell? NO . Have you been around anyone with known Covid 19? NO . Have you been around anyone who is awaiting Covid 19 test results in the past 7 to 10 days? NO Have you been around anyone who has been exposed to Covid 19, or has mentioned symptoms of Covid 19 within the past 7 to 10 days? NO

## 2018-09-20 ENCOUNTER — Ambulatory Visit: Payer: Medicare HMO | Admitting: Physician Assistant

## 2018-09-20 ENCOUNTER — Other Ambulatory Visit: Payer: Self-pay

## 2018-09-20 ENCOUNTER — Encounter: Payer: Self-pay | Admitting: Physician Assistant

## 2018-09-20 VITALS — BP 140/70 | HR 74 | Ht 68.5 in | Wt 254.8 lb

## 2018-09-20 DIAGNOSIS — I25119 Atherosclerotic heart disease of native coronary artery with unspecified angina pectoris: Secondary | ICD-10-CM | POA: Diagnosis not present

## 2018-09-20 DIAGNOSIS — R0789 Other chest pain: Secondary | ICD-10-CM

## 2018-09-20 DIAGNOSIS — E7800 Pure hypercholesterolemia, unspecified: Secondary | ICD-10-CM | POA: Diagnosis not present

## 2018-09-20 DIAGNOSIS — I10 Essential (primary) hypertension: Secondary | ICD-10-CM | POA: Diagnosis not present

## 2018-09-20 DIAGNOSIS — I2510 Atherosclerotic heart disease of native coronary artery without angina pectoris: Secondary | ICD-10-CM | POA: Diagnosis not present

## 2018-09-20 NOTE — Progress Notes (Signed)
Cardiology Office Note    Date:  09/20/2018   ID:  Joel [name], DOB 1940/10/01, MRN [id number]  PCP:  Girtha Rm, NP-C  Cardiologist: Lauree Chandler, MD EPS: None  Chief Complaint  Patient presents with  . Hospitalization Follow-up    History of Present Illness:  Joel [name] is a 78 y.o. male with history of hypertension, HLD, PAD, DM, CAD dating back to 05/2012 with BMS to OM2 and moderate LAD and RCA disease.negative lexiscan 10/2012.  Cardiac cath 2015 moderate disease in the LAD and RCA.  He has been to the ED several times with chest pain be atypical felt mostly related to GERD or MS pain. NST 11/2018showed EF 55% with moderate inferior defect that partially improves consistent with ischemia and possible soft tissue attenuation.  Small region of anterior anterior septal ischemia.  Read as an intermediate study.   Patient was in ED 07/24/18 with chest pain, troponins negative EKG unchanged. Saw Dr. Angelena Form 4/20/20and no further pain or w/u recommended. Patient back in ED with chest pain 08/19/18 relieved with belching.   Patient back in the ER 09/15/2018 with sharp left-sided chest pain similar to a few weeks ago but none relieved with belching.  CT angiogram without pulmonary embolus however there was concern for possible penetrating ulcer of the aorta.  Vascular surgery recommended CT scan of the aorta which was negative for acute pathology and they did not feel there was penetrating aortic ulcer.  Case was discussed with Dr. Angelena Form who felt he was stable to be discharged with negative troponins.  Patient says he occasionally feels a tickle in his left chest. 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 Denies chest tightness, pressure, dizziness or presyncope.   Past Medical History:  Diagnosis Date  . Atypical chest pain   . CAD (coronary artery disease)    a. Multiple prior evaluations then 05/2012 s/p BMS to OM2. b.  Nuc 11/2012 wnl. c. Cath 2015: patent stent, moderate LAD/RCA dz, treated medically.  . Cancer ([place])    Hx: of prostate  . Cataract   . CKD (chronic kidney disease), stage II   . COPD (chronic obstructive pulmonary disease) ([place])   . Cough secondary to angiotensin converting enzyme inhibitor (ACE-I)   . Diabetes mellitus without complication ([place])   . Diverticulosis   . DVT (deep venous thrombosis) ([place])   . Family history of breast cancer in first degree relative   . GERD (gastroesophageal reflux disease)   . Headache(784.0)    Hx: of  . History of blood transfusion    "I've had 2; don't know what it was related to" (11/09/2012)  . HLD (hyperlipidemia)   . HTN (hypertension)   . Iron deficiency anemia   . Microcytic anemia   . Peripheral vascular disease ([place])    a. L pop-tibial bypass 2011, followed by [facility].  . Personal history of prostate cancer   . Rotator cuff injury    "left arm; never repaired" (11/09/2012)    Past Surgical History:  Procedure Laterality Date  . CARDIOVASCULAR STRESS TEST  Aug. 2014  . CATARACT EXTRACTION W/ INTRAOCULAR LENS IMPLANT Right ~ 2008  . CIRCUMCISION    . COLONOSCOPY    . CORONARY ANGIOPLASTY WITH STENT PLACEMENT  2014   "1" (11/09/2012)  . ILIAC ARTERY ANEURYSM REPAIR    . LEFT HEART CATH  05-31-12  . LEFT HEART CATHETERIZATION WITH CORONARY ANGIOGRAM  N/A 06/01/2011   Procedure: LEFT HEART CATHETERIZATION WITH CORONARY ANGIOGRAM;  Surgeon: Hillary Bow, MD;  Location: [facility] CATH LAB;  Service: Cardiovascular;  Laterality: N/A;  . LEFT HEART CATHETERIZATION WITH CORONARY ANGIOGRAM N/A 05/31/2012   Procedure: LEFT HEART CATHETERIZATION WITH CORONARY ANGIOGRAM;  Surgeon: Peter M Martinique, MD;  Location[facility] CATH LAB;  Service: Cardiovascular;  Laterality: N/A;  . LEFT HEART CATHETERIZATION WITH CORONARY ANGIOGRAM N/A 08/01/2013   Procedure: LEFT HEART CATHETERIZATION WITH CORONARY ANGIOGRAM;  Surgeon: Burnell Blanks, MD;  Location: [facility] CATH LAB;   Service: Cardiovascular;  Laterality: N/A;  . PERCUTANEOUS CORONARY STENT INTERVENTION (PCI-S)  05/31/2012   Procedure: PERCUTANEOUS CORONARY STENT INTERVENTION (PCI-S);  Surgeon: Peter M Martinique, MD;  Location: [facility] CATH LAB;  Service: Cardiovascular;;  . PR VEIN BYPASS GRAFT,AORTO-FEM-POP Left 10/01/2009   left below knee popliteal artery to posterior tibial artery  . SHOULDER ARTHROSCOPY WITH OPEN ROTATOR CUFF REPAIR AND DISTAL CLAVICLE ACROMINECTOMY Left 01/12/2013   Procedure: LEFT SHOULDER ARTHROSCOPY WITH DEBRIDEMENT OPEN DISTAL CLAVICLE RESECTION ,acromioplastyAND ROTATOR CUFF REPAIR;  Surgeon: Yvette Rack., MD;  Location: [place];  Service: Orthopedics;  Laterality: Left;    Current Medications: Current Meds  Medication Sig  . abiraterone Acetate (ZYTIGA) 250 MG tablet Take 1,000 mg by mouth daily before breakfast. Take on an empty stomach 1 hour before or 2 hours after a meal   . albuterol (PROVENTIL HFA;VENTOLIN HFA) 108 (90 Base) MCG/ACT inhaler Inhale 2 puffs into the lungs every 6 (six) hours as needed for wheezing or shortness of breath.  Marland Kitchen amLODipine (NORVASC) 10 MG tablet Take 10 mg by mouth daily.  Marland Kitchen aspirin EC 81 MG tablet Take 81 mg by mouth daily.   Marland Kitchen atorvastatin (LIPITOR) 40 MG tablet Take 40 mg by mouth daily.  . brimonidine (ALPHAGAN) 0.2 % ophthalmic solution Place 1 drop into the right eye 2 (two) times daily.   . diclofenac sodium (VOLTAREN) 1 % GEL Apply 2 g topically 4 (four) times daily as needed (back pain).   . ferrous sulfate 325 (65 FE) MG tablet Take 325 mg by mouth once a week.   . fluticasone (FLONASE) 50 MCG/ACT nasal spray Place 2 sprays into both nostrils daily as needed for allergies or rhinitis.  Marland Kitchen gabapentin (NEURONTIN) 600 MG tablet Take 600 mg by mouth 2 (two) times daily.   . isosorbide mononitrate (IMDUR) 30 MG 24 hr tablet Take 30 mg by mouth daily.  . Lidocaine 5 % CREA Apply 1 application topically at bedtime as needed (back pain).  Marland Kitchen loratadine  (CLARITIN) 10 MG tablet Take 10 mg by mouth daily.  Marland Kitchen losartan-hydrochlorothiazide (HYZAAR) 100-25 MG tablet TAKE 1 TABLET BY MOUTH DAILY  . nitroGLYCERIN (NITROSTAT) 0.4 MG SL tablet Place 1 tablet (0.4 mg total) under the tongue every 5 (five) minutes as needed for chest pain.  Marland Kitchen omeprazole (PRILOSEC) 20 MG capsule Take 20 mg by mouth 2 (two) times daily before a meal.  . predniSONE (DELTASONE) 5 MG tablet Take 5 mg by mouth daily before breakfast.   . traMADol (ULTRAM) 50 MG tablet TAKE 1 TABLET(50 MG) BY MOUTH EVERY 8 HOURS AS NEEDED FOR SEVERE PAIN (Patient taking differently: Take 50 mg by mouth every 8 (eight) hours as needed for severe pain. )     Allergies:   Topiramate; Atenolol; Lisinopril; and Tramadol-acetaminophen   Social History   Socioeconomic History  . Marital status: Widowed    Spouse name: Not on file  .  Number of children: Not on file  . Years of education: Not on file  . Highest education level: 12th grade  Occupational History  . Occupation: Retired  Scientific laboratory technician  . Financial resource strain: Not on file  . Food insecurity:    Worry: Not on file    Inability: Not on file  . Transportation needs:    Medical: Not on file    Non-medical: Not on file  Tobacco Use  . Smoking status: Former Smoker    Packs/day: 0.50    Years: 52.00    Pack years: 26.00    Types: Cigarettes    Last attempt to quit: 05/26/2011    Years since quitting: 7.3  . Smokeless tobacco: Never Used  Substance and Sexual Activity  . Alcohol use: No  . Drug use: No  . Sexual activity: Not on file  Lifestyle  . Physical activity:    Days per week: Not on file    Minutes per session: Not on file  . Stress: Not on file  Relationships  . Social connections:    Talks on phone: Not on file    Gets together: Not on file    Attends religious service: Not on file    Active member of club or organization: Not on file    Attends meetings of clubs or organizations: Not on file     Relationship status: Not on file  Other Topics Concern  . 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  Lives in [place] by himself.      Patient writes with his right hand, though uses his left hand for every thing else. He lives alone in a one level home. He drinks 3 cups of coffee a day and 3 7-8 oz sodas a day. He does not exercise.     Family History:  The patient's family history includes Breast cancer in his mother; COPD in his father; Cancer (age of onset: 53) in his brother; Cancer (age of onset: 43) in his brother; Hyperlipidemia in his father and mother; Hypertension in his brother, daughter, father, mother, sister, and son.   ROS:   Please see the history of present illness.    Review of Systems  Musculoskeletal: Positive for arthritis, back pain, joint pain, muscle weakness and myalgias.   All other systems reviewed and are negative.   PHYSICAL EXAM:   VS:  BP 140/70   Pulse 74   Ht 5' 8.5" (1.74 m)   Wt 254 lb 12.8 oz (115.6 kg)   SpO2 95%   BMI 38.18 kg/m   Physical Exam  GEN: Obese in no acute distress  Neck: no JVD, carotid bruits, or masses Cardiac:RRR; no murmurs, rubs, or gallops  Respiratory:  clear to auscultation bilaterally, normal work of breathing GI: soft, nontender, nondistended, + BS Ext: without cyanosis, clubbing, or edema, Good distal pulses bilaterally Neuro:  Alert and Oriented x 3 Psych: euthymic mood, full affect  Wt Readings from Last 3 Encounters:  09/20/18 254 lb 12.8 oz (115.6 kg)  09/15/18 254 lb (115.2 kg)  09/13/18 254 lb (115.2 kg)      Studies/Labs Reviewed:   EKG:  EKG is not ordered today.  The ekg reviewed from 09/18/2018 normal sinus rhythm no acute change Recent Labs: 09/15/2018: ALT 15; BUN 15; Creatinine, Ser 1.21; Hemoglobin 10.1; Platelets 185; Potassium 3.4; Sodium 143   Lipid Panel    Component Value Date/Time   CHOL  174 10/24/2017 0822   TRIG 135 10/24/2017 0822   HDL 47  10/24/2017 0822   CHOLHDL 3.7 10/24/2017 0822   CHOLHDL 5 02/06/2013 0741   VLDL 24.4 02/06/2013 0741   LDLCALC 100 (H) 10/24/2017 7322    Additional studies/ records that were reviewed today include:   CT of the aorta 6/5/2020FINDINGS: Cardiovascular: Preferential opacification of the thoracic aorta. The descending thoracic aorta is minimally enlarged measuring 3.1 x 3.1 cm. Normal caliber of the ascending aorta. There is moderate mixed calcific atherosclerosis of the aorta, with irregular atherosclerosis of the lower thoracic aorta. No evidence of dissection, penetrating ulceration, or other acute aortic pathology. Cardiomegaly. Three-vessel coronary artery calcifications. No pericardial effusion.   Mediastinum/Nodes: No enlarged mediastinal, hilar, or axillary lymph nodes. Thyroid gland, trachea, and esophagus demonstrate no significant findings.   Lungs/Pleura: Lungs are clear. No pleural effusion or pneumothorax.   Upper Abdomen: No acute abnormality. Multiple low-attenuation lesions of the liver, some of which are fluid attenuation cysts or hemangiomas, other incompletely characterized.   Musculoskeletal: No chest wall abnormality. No acute or significant osseous findings.   Review of the MIP images confirms the above findings.   IMPRESSION: 1. The descending thoracic aorta is minimally enlarged measuring 3.1 x 3.1 cm. Normal caliber of the ascending aorta. There is moderate mixed calcific atherosclerosis of the aorta, with irregular atherosclerosis of the lower thoracic aorta. No evidence of dissection, penetrating ulceration, or other acute aortic pathology.   2.  Coronary artery disease.   3. Multiple low-attenuation lesions of the liver included in the upper abdomen, some of which are fluid attenuation cysts or hemangiomas, other incompletely characterized.     Electronically Signed   By: Eddie Candle M.D.   On: 09/15/2018 12:45  CT of the chest  6/5/2020IMPRESSION: 1. Indeterminate 3 x 1.6 cm nodule in the posterior mediastinum with broad contact along the aorta. This could be a penetrating ulcer or adenopathy (there are other generous sized lymph nodes in this area). Recommend follow up CTA of the aorta. 2. Negative for pulmonary embolism.        Nuclear stress test 11/29/18Study Highlights     Nuclear stress EF: 55%.  Moderate sided inferior defect that partially improves consistent with ischemia and possible soft tissue attenuation. Small region of anterior/anteroseptal ischemia. (base)  This is an intermediate risk study.          Cath 07/2013 Angiographic Findings:   Left main: No obstructive disease.     Left Anterior Descending Artery: Large caliber vessel that courses to the apex. The proximal and mid vessel is ectatic. There is diffuse 30-40% stenosis in the mid vessel. There are mild luminal irregularities in the distal vessel.     Circumflex Artery: Large caliber vessel with two obtuse marginal branches. The first OM branch is small in caliber and is chronically occluded in the mid vessel. The second OM branch is patent with patent stent, minimal restenosis. The distal segment of the OM branch has diffuse plaque.     Right Coronary Artery: Large caliber dominant vessel with proximal Shepherd's crook. The proximal vessel has diffuse 40% stenosis. The mid vessel is ectatic with 40% stenosis. The distal vessel has diffuse 50% stenosis. The PDA has 50% proximal stenosis followed by diffuse plaque.    Left Ventricular Angiogram: LVEF=65%.    Impression: 1. Triple vessel CAD with patent stent in the second OM branch.   2. Moderate non-obstructive disease in the LAD and RCA 3. Normal LV  function   Recommendations: Continue medical management of CAD.                   ASSESSMENT:    1. Chest pain, atypical   2. Coronary artery disease involving native coronary artery of native heart without angina  pectoris   3. Essential hypertension   4. Pure hypercholesterolemia      PLAN:  In order of problems listed above:  Chest pain atypical described as tickle or sharp shooting pains usually not associated with exertion but occurs at rest.  Multiple ER visits resulting in negative troponins and EKGs without changes.  He did have a moderate risk Myoview in 2018.  I did offer repeat stress test but he would like to hold off for a month and see how symptoms do.  CAD with chronic chest pain and multiple emergency room visits.  No recurrence since his last ER visit.  Nuclear stress test 02/2017 no large areas of ischemia.  Offered repeat stress test but patient will call us if he wants to proceed.  Follow-up with Dr. Angelena Form in 3 months  Essential hypertension blood pressure well controlled  Hyperlipidemia on Lipitor LDL was 100 last July followed by PCP should have repeat    Medication Adjustments/Labs and Tests Ordered: Current medicines are reviewed at length with the patient today.  Concerns regarding medicines are outlined above.  Medication changes, Labs and Tests ordered today are listed in the Patient Instructions below. Patient Instructions  Medication Instructions:  Your physician recommends that you continue on your current medications as directed. Please refer to the Current Medication list given to you today.  If you need a refill on your cardiac medications before your next appointment, please call your pharmacy.   Lab work: None Ordered  If you have labs (blood work) drawn today and your tests are completely normal, you will receive your results only by: Marland Kitchen MyChart Message (if you have MyChart) OR . A paper copy in the mail If you have any lab test that is abnormal or we need to change your treatment, we will call you to review the results.  Testing/Procedures: None ordered  Follow-Up: . Follow up with Dr. Angelena Form via Virtual Visit in 3 months. Dr. Camillia Herter nurse will  call you to arrange.  Any Other Special Instructions Will Be Listed Below (If Applicable).       Sumner Boast, PA-C  09/20/2018 11:42 AM    [facility] [address], [place], [place]  [address] Phone: [phone]; Fax: [phone]

## 2018-09-20 NOTE — Patient Instructions (Addendum)
Medication Instructions:  Your physician recommends that you continue on your current medications as directed. Please refer to the Current Medication list given to you today.  If you need a refill on your cardiac medications before your next appointment, please call your pharmacy.   Lab work: None Ordered  If you have labs (blood work) drawn today and your tests are completely normal, you will receive your results only by: Marland Kitchen MyChart Message (if you have MyChart) OR . A paper copy in the mail If you have any lab test that is abnormal or we need to change your treatment, we will call you to review the results.  Testing/Procedures: None ordered  Follow-Up: . Follow up with Dr. Angelena Form via Virtual Visit in 3 months. Dr. Camillia Herter nurse will call you to arrange.  Any Other Special Instructions Will Be Listed Below (If Applicable).

## 2018-09-26 DIAGNOSIS — M791 Myalgia, unspecified site: Secondary | ICD-10-CM | POA: Diagnosis not present

## 2018-09-26 DIAGNOSIS — M542 Cervicalgia: Secondary | ICD-10-CM | POA: Diagnosis not present

## 2018-09-26 DIAGNOSIS — G518 Other disorders of facial nerve: Secondary | ICD-10-CM | POA: Diagnosis not present

## 2018-09-26 DIAGNOSIS — R51 Headache: Secondary | ICD-10-CM | POA: Diagnosis not present

## 2018-10-10 DIAGNOSIS — G518 Other disorders of facial nerve: Secondary | ICD-10-CM | POA: Diagnosis not present

## 2018-10-10 DIAGNOSIS — M791 Myalgia, unspecified site: Secondary | ICD-10-CM | POA: Diagnosis not present

## 2018-10-10 DIAGNOSIS — R51 Headache: Secondary | ICD-10-CM | POA: Diagnosis not present

## 2018-10-10 DIAGNOSIS — M542 Cervicalgia: Secondary | ICD-10-CM | POA: Diagnosis not present

## 2018-10-11 ENCOUNTER — Ambulatory Visit
Admission: RE | Admit: 2018-10-11 | Discharge: 2018-10-11 | Disposition: A | Discharge status: Home / Self Care | Payer: Medicare HMO | Source: Ambulatory Visit | Attending: Orthopaedic Surgery | Admitting: Orthopaedic Surgery

## 2018-10-11 ENCOUNTER — Other Ambulatory Visit: Payer: Medicare HMO

## 2018-10-11 DIAGNOSIS — M48061 Spinal stenosis, lumbar region without neurogenic claudication: Secondary | ICD-10-CM | POA: Diagnosis not present

## 2018-10-11 DIAGNOSIS — M545 Low back pain, unspecified: Secondary | ICD-10-CM

## 2018-10-17 ENCOUNTER — Telehealth: Payer: Self-pay | Admitting: Family Medicine

## 2018-10-17 NOTE — Telephone Encounter (Signed)
Received requested records from [facility] 

## 2018-10-18 ENCOUNTER — Other Ambulatory Visit: Payer: Self-pay | Admitting: *Deleted

## 2018-10-18 ENCOUNTER — Telehealth: Payer: Self-pay | Admitting: Internal Medicine

## 2018-10-18 DIAGNOSIS — M4716 Other spondylosis with myelopathy, lumbar region: Secondary | ICD-10-CM | POA: Diagnosis not present

## 2018-10-18 DIAGNOSIS — M5136 Other intervertebral disc degeneration, lumbar region: Secondary | ICD-10-CM | POA: Diagnosis not present

## 2018-10-18 DIAGNOSIS — M7918 Myalgia, other site: Secondary | ICD-10-CM | POA: Diagnosis not present

## 2018-10-18 DIAGNOSIS — M48062 Spinal stenosis, lumbar region with neurogenic claudication: Secondary | ICD-10-CM | POA: Diagnosis not present

## 2018-10-18 DIAGNOSIS — R6889 Other general symptoms and signs: Secondary | ICD-10-CM | POA: Diagnosis not present

## 2018-10-18 DIAGNOSIS — M4316 Spondylolisthesis, lumbar region: Secondary | ICD-10-CM | POA: Diagnosis not present

## 2018-10-18 DIAGNOSIS — Z20822 Contact with and (suspected) exposure to covid-19: Secondary | ICD-10-CM

## 2018-10-18 NOTE — Telephone Encounter (Signed)
Pt went for covid testing today. Not having any symptoms but wanting to get tested anyways. Pt has a surgery clearance from spine and scoliosis but I have advised pt we have to wait until results come back before beinging him in for clearance. Advised pt to call us when he gets the results and we can go from there.

## 2018-10-18 NOTE — Progress Notes (Signed)
l °

## 2018-10-23 ENCOUNTER — Other Ambulatory Visit: Payer: Self-pay | Admitting: Family Medicine

## 2018-10-24 LAB — NOVEL CORONAVIRUS, NAA: SARS-CoV-2, NAA: NOT DETECTED

## 2018-10-31 DIAGNOSIS — M7918 Myalgia, other site: Secondary | ICD-10-CM | POA: Diagnosis not present

## 2018-10-31 DIAGNOSIS — M4716 Other spondylosis with myelopathy, lumbar region: Secondary | ICD-10-CM | POA: Diagnosis not present

## 2018-11-02 ENCOUNTER — Other Ambulatory Visit: Payer: Medicare HMO

## 2018-11-09 ENCOUNTER — Other Ambulatory Visit: Payer: Self-pay | Admitting: Family Medicine

## 2018-11-18 ENCOUNTER — Emergency Department (HOSPITAL_COMMUNITY)
Admission: EM | Admit: 2018-11-18 | Discharge: 2018-11-18 | Disposition: A | Discharge status: Home / Self Care | Payer: Medicare HMO | Attending: Emergency Medicine | Admitting: Emergency Medicine

## 2018-11-18 ENCOUNTER — Other Ambulatory Visit: Payer: Self-pay

## 2018-11-18 ENCOUNTER — Encounter (HOSPITAL_COMMUNITY): Payer: Self-pay | Admitting: Emergency Medicine

## 2018-11-18 ENCOUNTER — Emergency Department (HOSPITAL_COMMUNITY): Payer: Medicare HMO

## 2018-11-18 DIAGNOSIS — J449 Chronic obstructive pulmonary disease, unspecified: Secondary | ICD-10-CM | POA: Insufficient documentation

## 2018-11-18 DIAGNOSIS — Z8546 Personal history of malignant neoplasm of prostate: Secondary | ICD-10-CM | POA: Diagnosis not present

## 2018-11-18 DIAGNOSIS — Z86718 Personal history of other venous thrombosis and embolism: Secondary | ICD-10-CM | POA: Insufficient documentation

## 2018-11-18 DIAGNOSIS — E1122 Type 2 diabetes mellitus with diabetic chronic kidney disease: Secondary | ICD-10-CM | POA: Insufficient documentation

## 2018-11-18 DIAGNOSIS — Z888 Allergy status to other drugs, medicaments and biological substances status: Secondary | ICD-10-CM | POA: Diagnosis not present

## 2018-11-18 DIAGNOSIS — I2510 Atherosclerotic heart disease of native coronary artery without angina pectoris: Secondary | ICD-10-CM | POA: Diagnosis not present

## 2018-11-18 DIAGNOSIS — E785 Hyperlipidemia, unspecified: Secondary | ICD-10-CM | POA: Insufficient documentation

## 2018-11-18 DIAGNOSIS — Z7982 Long term (current) use of aspirin: Secondary | ICD-10-CM | POA: Insufficient documentation

## 2018-11-18 DIAGNOSIS — N182 Chronic kidney disease, stage 2 (mild): Secondary | ICD-10-CM | POA: Insufficient documentation

## 2018-11-18 DIAGNOSIS — R0789 Other chest pain: Secondary | ICD-10-CM | POA: Insufficient documentation

## 2018-11-18 DIAGNOSIS — Z87891 Personal history of nicotine dependence: Secondary | ICD-10-CM | POA: Diagnosis not present

## 2018-11-18 DIAGNOSIS — Z79899 Other long term (current) drug therapy: Secondary | ICD-10-CM | POA: Insufficient documentation

## 2018-11-18 DIAGNOSIS — R079 Chest pain, unspecified: Secondary | ICD-10-CM

## 2018-11-18 DIAGNOSIS — M25512 Pain in left shoulder: Secondary | ICD-10-CM | POA: Diagnosis not present

## 2018-11-18 DIAGNOSIS — Z9861 Coronary angioplasty status: Secondary | ICD-10-CM | POA: Insufficient documentation

## 2018-11-18 DIAGNOSIS — I129 Hypertensive chronic kidney disease with stage 1 through stage 4 chronic kidney disease, or unspecified chronic kidney disease: Secondary | ICD-10-CM | POA: Insufficient documentation

## 2018-11-18 LAB — TROPONIN I (HIGH SENSITIVITY): Troponin I (High Sensitivity): 8 ng/L (ref <18)

## 2018-11-18 MED ORDER — METHOCARBAMOL 500 MG PO TABS: 500.0000 mg | ORAL_TABLET | Freq: Three times a day (TID) | ORAL | 0 refills | Status: DC | PRN

## 2018-11-18 NOTE — ED Triage Notes (Signed)
Pt here w hx of left rotator cuff pain, back injury pending a back surgery. Pt states in grocery store he was reaching for a basket and began having sharp left shoulder pain to shoulder and radiating to the left side/axilla. Pt denies any centralized or lateral chest pain. Pt has hx of stents. States when the pain comes back it "takes his breath away but he does not think it is his heart."

## 2018-11-18 NOTE — ED Provider Notes (Signed)
[facility] EMERGENCY DEPARTMENT Provider Note   CSN: [id number] Arrival date & time: 11/18/18  1412    History   Chief Complaint Chief Complaint  Patient presents with  . Shoulder Pain    HPI Joel [name] is a 78 y.o. male.     HPI Patient presents with sharp left-sided chest pain.  Began earlier today.  Not short of breath.  Worse with movements.  Worse with palpation.  States does not feel like his previous heart problems.  States he has some back problems with any surgery for previous as injections or previously.  States it is in his left lower lateral chest wall.  Worse when he moves his arm in certain positions.  States it was worse when he reached for something at the grocery store today.  No fall.  States he had a previous rotator cuff problem with this feels different.  No swelling in his legs.  History states DVT but patient states he does not know that he had any previously. Past Medical History:  Diagnosis Date  . Atypical chest pain   . CAD (coronary artery disease)    a. Multiple prior evaluations then 05/2012 s/p BMS to OM2. b. Nuc 11/2012 wnl. c. Cath 2015: patent stent, moderate LAD/RCA dz, treated medically.  . Cancer ([place])    Hx: of prostate  . Cataract   . CKD (chronic kidney disease), stage II   . COPD (chronic obstructive pulmonary disease) ([place])   . Cough secondary to angiotensin converting enzyme inhibitor (ACE-I)   . Diabetes mellitus without complication ([place])   . Diverticulosis   . DVT (deep venous thrombosis) ([place])   . Family history of breast cancer in first degree relative   . GERD (gastroesophageal reflux disease)   . Headache(784.0)    Hx: of  . History of blood transfusion    "I've had 2; don't know what it was related to" (11/09/2012)  . HLD (hyperlipidemia)   . HTN (hypertension)   . Iron deficiency anemia   . Microcytic anemia   . Peripheral vascular disease ([place])    a. L pop-tibial bypass 2011, followed by [facility].  .  Personal history of prostate cancer   . Rotator cuff injury    "left arm; never repaired" (11/09/2012)    Patient Active Problem List   Diagnosis Date Noted  . Chronic low back pain 09/11/2018  . COPD (chronic obstructive pulmonary disease) ([place])   . Seasonal allergies 07/04/2018  . Chronic obstructive pulmonary disease ([place]) 07/04/2018  . Dizziness 01/20/2018  . Malaise 01/20/2018  . Decreased breath sounds 01/20/2018  . Needs flu shot 01/20/2018  . Chronic nonintractable headache 12/07/2017  . Back pain 11/01/2017  . Radicular pain of lower extremity 11/01/2017  . Prostate cancer ([place]) 11/01/2017  . Family history of breast cancer in first degree relative   . Personal history of prostate cancer   . Elliptocytosis on peripheral blood smear ([place]) 09/13/2016  . Prediabetes 03/22/2016  . PVD (peripheral vascular disease) with claudication ([place]) 11/15/2012  . Chest pain, localized 11/10/2012  . Hypokalemia 11/10/2012  . Bradycardia 11/09/2012  . Thrombocytopenia ([place]) 06/01/2012  . Microcytic anemia   . Atherosclerosis of native arteries of the extremities with intermittent claudication 06/16/2011  . Stable angina ([place]) 06/11/2011  . CAD (coronary artery disease)   . Chest pain, atypical 05/30/2011  . Iron deficiency anemia 10/03/2009  . DIVERTICULOSIS OF COLON 10/03/2009  . WEIGHT LOSS, ABNORMAL 10/03/2009  . CHEST PAIN  UNSPECIFIED 09/13/2008  . Headache(784.0) 08/19/2008  . TIA 06/05/2008  . SHOULDER PAIN, LEFT 10/11/2007  . COUGH DUE TO ACE INHIBITORS 05/07/2007  . History of tobacco use disorder 04/25/2007  . Hyperlipidemia 04/21/2007  . Essential hypertension 04/21/2007  . GERD 04/21/2007    Past Surgical History:  Procedure Laterality Date  . CARDIOVASCULAR STRESS TEST  Aug. 2014  . CATARACT EXTRACTION W/ INTRAOCULAR LENS IMPLANT Right ~ 2008  . CIRCUMCISION    . COLONOSCOPY    . CORONARY ANGIOPLASTY WITH STENT PLACEMENT  2014   "1" (11/09/2012)  . ILIAC ARTERY  ANEURYSM REPAIR    . LEFT HEART CATH  05-31-12  . LEFT HEART CATHETERIZATION WITH CORONARY ANGIOGRAM N/A 06/01/2011   Procedure: LEFT HEART CATHETERIZATION WITH CORONARY ANGIOGRAM;  Surgeon: Hillary Bow, MD;  Location: [facility] CATH LAB;  Service: Cardiovascular;  Laterality: N/A;  . LEFT HEART CATHETERIZATION WITH CORONARY ANGIOGRAM N/A 05/31/2012   Procedure: LEFT HEART CATHETERIZATION WITH CORONARY ANGIOGRAM;  Surgeon: Peter M Martinique, MD;  Location[facility] CATH LAB;  Service: Cardiovascular;  Laterality: N/A;  . LEFT HEART CATHETERIZATION WITH CORONARY ANGIOGRAM N/A 08/01/2013   Procedure: LEFT HEART CATHETERIZATION WITH CORONARY ANGIOGRAM;  Surgeon: Burnell Blanks, MD;  Location: [facility] CATH LAB;  Service: Cardiovascular;  Laterality: N/A;  . PERCUTANEOUS CORONARY STENT INTERVENTION (PCI-S)  05/31/2012   Procedure: PERCUTANEOUS CORONARY STENT INTERVENTION (PCI-S);  Surgeon: Peter M Martinique, MD;  Location: [facility] CATH LAB;  Service: Cardiovascular;;  . PR VEIN BYPASS GRAFT,AORTO-FEM-POP Left 10/01/2009   left below knee popliteal artery to posterior tibial artery  . SHOULDER ARTHROSCOPY WITH OPEN ROTATOR CUFF REPAIR AND DISTAL CLAVICLE ACROMINECTOMY Left 01/12/2013   Procedure: LEFT SHOULDER ARTHROSCOPY WITH DEBRIDEMENT OPEN DISTAL CLAVICLE RESECTION ,acromioplastyAND ROTATOR CUFF REPAIR;  Surgeon: Yvette Rack., MD;  Location: [facility];  Service: Orthopedics;  Laterality: Left;        Home Medications    Prior to Admission medications   Medication Sig Start Date End Date Taking? Authorizing Provider  abiraterone Acetate (ZYTIGA) 250 MG tablet Take 1,000 mg by mouth daily before breakfast. Take on an empty stomach 1 hour before or 2 hours after a meal     [provider]  albuterol (PROVENTIL HFA;VENTOLIN HFA) 108 (90 Base) MCG/ACT inhaler Inhale 2 puffs into the lungs every 6 (six) hours as needed for wheezing or shortness of breath. 07/04/18   Henson, Vickie L, NP-C  amLODipine (NORVASC) 10  MG tablet TAKE 1 TABLET BY MOUTH EVERY DAY 11/09/18   Henson, Vickie L, NP-C  aspirin EC 81 MG tablet Take 81 mg by mouth daily.     [provider]  atorvastatin (LIPITOR) 40 MG tablet Take 40 mg by mouth daily.    [provider]  brimonidine (ALPHAGAN) 0.2 % ophthalmic solution Place 1 drop into the right eye 2 (two) times daily.  09/13/17   [provider]  diclofenac sodium (VOLTAREN) 1 % GEL Apply 2 g topically 4 (four) times daily as needed (back pain).     [provider]  ferrous sulfate 325 (65 FE) MG tablet Take 325 mg by mouth once a week.     [provider]  fluticasone (FLONASE) 50 MCG/ACT nasal spray SHAKE LIQUID AND USE 2 SPRAYS IN EACH NOSTRIL DAILY 10/23/18   Henson, Vickie L, NP-C  gabapentin (NEURONTIN) 600 MG tablet Take 600 mg by mouth 2 (two) times daily.     [provider]  isosorbide mononitrate (IMDUR) 30  MG 24 hr tablet Take 30 mg by mouth daily.    [provider]  Lidocaine 5 % CREA Apply 1 application topically at bedtime as needed (back pain).    [provider]  loratadine (CLARITIN) 10 MG tablet Take 10 mg by mouth daily.    [provider]  losartan-hydrochlorothiazide (HYZAAR) 100-25 MG tablet TAKE 1 TABLET BY MOUTH DAILY 07/24/18   Henson, Vickie L, NP-C  methocarbamol (ROBAXIN) 500 MG tablet Take 1 tablet (500 mg total) by mouth every 8 (eight) hours as needed for muscle spasms. 11/18/18   Davonna Belling, MD  nitroGLYCERIN (NITROSTAT) 0.4 MG SL tablet Place 1 tablet (0.4 mg total) under the tongue every 5 (five) minutes as needed for chest pain. 04/27/18   Lyda Jester M, PA-C  omeprazole (PRILOSEC) 20 MG capsule Take 20 mg by mouth 2 (two) times daily before a meal.    [provider]  predniSONE (DELTASONE) 5 MG tablet Take 5 mg by mouth daily before breakfast.     [provider]  traMADol (ULTRAM) 50 MG tablet TAKE 1 TABLET(50 MG) BY MOUTH EVERY 8 HOURS AS  NEEDED FOR SEVERE PAIN Patient taking differently: Take 50 mg by mouth every 8 (eight) hours as needed for severe pain.  09/11/18   Girtha Rm, NP-C    Family History Family History  Problem Relation Age of Onset  . Breast cancer Mother        dx 42s  . Hyperlipidemia Mother   . Hypertension Mother   . COPD Father        deceased  . Hyperlipidemia Father   . Hypertension Father   . Hypertension Sister   . Cancer Brother 72       Unknown cancer, possibly stomach  . Hypertension Brother   . Cancer Brother 57       Unknown, possibly lung  . Hypertension Daughter   . Hypertension Son   . Colon cancer Neg Hx        pt is unclear about family hx    Social History Social History   Tobacco Use  . Smoking status: Former Smoker    Packs/day: 0.50    Years: 52.00    Pack years: 26.00    Types: Cigarettes    Quit date: 05/26/2011    Years since quitting: 7.4  . Smokeless tobacco: Never Used  Substance Use Topics  . Alcohol use: No  . Drug use: No     Allergies   Topiramate, Atenolol, Lisinopril, and Tramadol-acetaminophen   Review of Systems Review of Systems  Constitutional: Negative for appetite change.  Respiratory: Negative for cough and shortness of breath.   Cardiovascular: Positive for chest pain.  Gastrointestinal: Negative for abdominal pain.  Genitourinary: Negative for flank pain.  Musculoskeletal: Negative for arthralgias.  Skin: Negative for rash.  Neurological: Negative for weakness.     Physical Exam Updated Vital Signs BP 113/70   Pulse 66   Temp 98.1 F (36.7 C) (Oral)   Resp 18   SpO2 97%   Physical Exam Vitals signs and nursing note reviewed.  HENT:     Head: Atraumatic.  Neck:     Musculoskeletal: Neck supple.  Cardiovascular:     Rate and Rhythm: Regular rhythm.  Pulmonary:     Comments: Reproducible pain with certain movements on left lateral chest wall.  No rash.  Lungs are clear. Abdominal:     Palpations: There is no  mass.  Tenderness: There is no abdominal tenderness.  Musculoskeletal:     Right lower leg: No edema.     Left lower leg: No edema.  Skin:    General: Skin is warm.     Capillary Refill: Capillary refill takes less than 2 seconds.  Neurological:     Mental Status: He is alert and oriented to person, place, and time.      ED Treatments / Results  Labs (all labs ordered are listed, but only abnormal results are displayed) Labs Reviewed  TROPONIN I (HIGH SENSITIVITY)    EKG EKG Interpretation  Date/Time:  Saturday November 18 2018 14:27:52 EDT Ventricular Rate:  65 PR Interval:  224 QRS Duration: 86 QT Interval:  414 QTC Calculation: 430 R Axis:   32 Text Interpretation:  Sinus rhythm with 1st degree A-V block with Premature supraventricular complexes Otherwise normal ECG Confirmed by Davonna Belling [phone]) on 11/18/2018 3:33:01 PM   Radiology Dg Chest 2 View  Result Date: 11/18/2018 CLINICAL DATA:  Posterior left shoulder pain today. History of rotator cuff surgery. EXAM: CHEST - 2 VIEW COMPARISON:  CT and radiographs 09/15/2018. FINDINGS: The heart size and mediastinal contours are normal. The lungs are clear. There is no pleural effusion or pneumothorax. No acute osseous findings are identified. There are stable degenerative changes within the spine. Telemetry leads overlie the chest. IMPRESSION: Stable chest.  No active cardiopulmonary process. Electronically Signed   By: Richardean Sale M.D.   On: 11/18/2018 16:35    Procedures Procedures (including critical care time)  Medications Ordered in ED Medications - No data to display   Initial Impression / Assessment and Plan / ED Course  I have reviewed the triage vital signs and the nursing notes.  Pertinent labs & imaging results that were available during my care of the patient were reviewed by me and considered in my medical decision making (see chart for details).        Patient left-sided chest pain.  I  think most likely chest wall pain.  Worse with certain movement.  Lungs are clear x-ray reassuring.  Troponin negative.  EKG reassuring.  Doubt pulmonary embolism.  Will discharge home with symptomatic treatment.  Final Clinical Impressions(s) / ED Diagnoses   Final diagnoses:  Nonspecific chest pain    ED Discharge Orders         Ordered    methocarbamol (ROBAXIN) 500 MG tablet  Every 8 hours PRN     11/18/18 1654           Davonna Belling, MD 11/18/18 1658

## 2018-11-18 NOTE — ED Notes (Signed)
Patient verbalizes understanding of discharge instructions. Opportunity for questioning and answers were provided. Armband removed by staff, pt discharged from ED.  

## 2018-11-23 ENCOUNTER — Encounter: Payer: Self-pay | Admitting: Family Medicine

## 2018-11-23 ENCOUNTER — Other Ambulatory Visit: Payer: Self-pay

## 2018-11-23 ENCOUNTER — Ambulatory Visit (INDEPENDENT_AMBULATORY_CARE_PROVIDER_SITE_OTHER): Payer: Medicare HMO | Admitting: Family Medicine

## 2018-11-23 VITALS — Ht 69.0 in | Wt 232.0 lb

## 2018-11-23 DIAGNOSIS — Z01818 Encounter for other preprocedural examination: Secondary | ICD-10-CM

## 2018-11-23 DIAGNOSIS — G8929 Other chronic pain: Secondary | ICD-10-CM

## 2018-11-23 DIAGNOSIS — R7303 Prediabetes: Secondary | ICD-10-CM | POA: Diagnosis not present

## 2018-11-23 DIAGNOSIS — M545 Low back pain, unspecified: Secondary | ICD-10-CM

## 2018-11-23 DIAGNOSIS — I10 Essential (primary) hypertension: Secondary | ICD-10-CM | POA: Diagnosis not present

## 2018-11-23 MED ORDER — GLUCOSE BLOOD VI STRP: ORAL_STRIP | 12 refills | Status: DC

## 2018-11-23 MED ORDER — ACCU-CHEK FASTCLIX LANCETS MISC: 12 refills | Status: AC

## 2018-11-23 NOTE — Progress Notes (Signed)
   Subjective:   Documentation for virtual audio and video telecommunications through [place] encounter:  The patient was located at home. 2 patient identifiers used.  The provider was located in the office. The patient did consent to this visit and is aware of possible charges through their insurance for this visit.  The other persons participating in this telemedicine service were none.    [name], male    DOB: [birth year], 78 y.o.   MRN: [id number]  HPI  Chief Complaint  Patient presents with  . surgical clearance   States he is having lumbar surgery later this month and needs surgical clearance from me.  Recent check up with cardiologist. Per surgical clearance form, cardiologist has cleared him for surgery.   Other doctors caring for patient include: Dr. Bertell [facility] Dr. Laurell Roof- Primary provider at [facility] Dr. Bobette Mo- Vascular Cardiology- Dr. Angelena Form Dr. Domingo Cocking- headache specialist.  Margorie John and Scoliosis specialists   History of borderline diabetes with Hgb A1c 6.5%. which has been his highest reading.  HTN- BP at home 120s/60-70s.   Followed by [facility] for prostate cancer.   Denies fever, chills, dizziness, chest pain, palpitations, shortness of breath, abdominal pain, N/V/D, urinary symptoms, LE edema.  No polyuria or polydipsia.   He would like to start checking his blood sugars.   States he is due for a full work up at the hospital prior to surgery. Does not want to come in here for labs.   Reviewed allergies, medications, past medical, surgical, family, and social history.   Review of Systems Denies fever, chills, dizziness, chest pain, palpitations, shortness of breath, abdominal pain, N/V/D, urinary symptoms, LE edema.      Objective:   Physical Exam Ht 5\' 9"  (1.753 m)   Wt 232 lb (105.2 kg)   BMI 34.26 kg/m   Alert and oriented and in no acute distress.  Respirations unlabored.  Normal speech, mood and thought process.       Assessment & Plan:  Preoperative clearance - Plan: Discussed that based on today's history and his most recent labs, I see no reason as to why he would not do well undergoing surgery. Discussed that as long as his cardiologist agrees, he has my ok for surgery.   Prediabetes - Plan: he will come by for a nurse visit and have his blood sugar checked as well as be given a meter and testing supplies so that he may start checking his BS at home.   Chronic low back pain, unspecified back pain laterality, unspecified whether sciatica present - Plan: managed by back surgeon   Essential hypertension - Plan: has not checked BP lately. We will check his BP when he comes for nurse visit either tomorrow or Monday.    Time spent on call was 24  minutes and in review of previous records 4 minutes total.  This virtual service is not related to other E/M service within previous 7 days.

## 2018-11-24 DIAGNOSIS — M48062 Spinal stenosis, lumbar region with neurogenic claudication: Secondary | ICD-10-CM | POA: Diagnosis not present

## 2018-11-24 DIAGNOSIS — M545 Low back pain: Secondary | ICD-10-CM | POA: Diagnosis not present

## 2018-11-24 DIAGNOSIS — Z4689 Encounter for fitting and adjustment of other specified devices: Secondary | ICD-10-CM | POA: Diagnosis not present

## 2018-11-24 DIAGNOSIS — Z6836 Body mass index (BMI) 36.0-36.9, adult: Secondary | ICD-10-CM | POA: Diagnosis not present

## 2018-11-24 DIAGNOSIS — M4716 Other spondylosis with myelopathy, lumbar region: Secondary | ICD-10-CM | POA: Diagnosis not present

## 2018-11-27 ENCOUNTER — Other Ambulatory Visit: Payer: Self-pay

## 2018-11-27 ENCOUNTER — Telehealth: Payer: Self-pay

## 2018-11-27 ENCOUNTER — Other Ambulatory Visit: Payer: Medicare HMO

## 2018-11-27 NOTE — Progress Notes (Signed)
Pt B/P was 120/76. Pt checked his glucose in office and was given his meter and a log to check at home. Was advised to check fasting and 2 hours post meal. KH

## 2018-11-27 NOTE — Telephone Encounter (Signed)
Pt B/P was 120/76. Pt checked his glucose in office and was given his meter and a log to check at home. Was advised to check fasting and 2 hours post meal. KH

## 2018-11-28 DIAGNOSIS — Z1159 Encounter for screening for other viral diseases: Secondary | ICD-10-CM | POA: Diagnosis not present

## 2018-11-28 DIAGNOSIS — M4716 Other spondylosis with myelopathy, lumbar region: Secondary | ICD-10-CM | POA: Diagnosis not present

## 2018-11-28 DIAGNOSIS — Z20828 Contact with and (suspected) exposure to other viral communicable diseases: Secondary | ICD-10-CM | POA: Diagnosis not present

## 2018-11-28 DIAGNOSIS — Z01812 Encounter for preprocedural laboratory examination: Secondary | ICD-10-CM | POA: Diagnosis not present

## 2018-12-01 DIAGNOSIS — Z0184 Encounter for antibody response examination: Secondary | ICD-10-CM | POA: Diagnosis not present

## 2018-12-01 DIAGNOSIS — Z01812 Encounter for preprocedural laboratory examination: Secondary | ICD-10-CM | POA: Diagnosis not present

## 2018-12-01 DIAGNOSIS — M4716 Other spondylosis with myelopathy, lumbar region: Secondary | ICD-10-CM | POA: Diagnosis not present

## 2018-12-01 DIAGNOSIS — R799 Abnormal finding of blood chemistry, unspecified: Secondary | ICD-10-CM | POA: Diagnosis not present

## 2018-12-01 DIAGNOSIS — Z0183 Encounter for blood typing: Secondary | ICD-10-CM | POA: Diagnosis not present

## 2018-12-05 DIAGNOSIS — K5900 Constipation, unspecified: Secondary | ICD-10-CM | POA: Diagnosis not present

## 2018-12-05 DIAGNOSIS — R5381 Other malaise: Secondary | ICD-10-CM | POA: Diagnosis not present

## 2018-12-05 DIAGNOSIS — Z981 Arthrodesis status: Secondary | ICD-10-CM | POA: Diagnosis not present

## 2018-12-05 DIAGNOSIS — R2681 Unsteadiness on feet: Secondary | ICD-10-CM | POA: Diagnosis not present

## 2018-12-05 DIAGNOSIS — M4716 Other spondylosis with myelopathy, lumbar region: Secondary | ICD-10-CM | POA: Diagnosis not present

## 2018-12-05 DIAGNOSIS — R918 Other nonspecific abnormal finding of lung field: Secondary | ICD-10-CM | POA: Diagnosis not present

## 2018-12-05 DIAGNOSIS — M961 Postlaminectomy syndrome, not elsewhere classified: Secondary | ICD-10-CM | POA: Diagnosis not present

## 2018-12-05 DIAGNOSIS — R739 Hyperglycemia, unspecified: Secondary | ICD-10-CM | POA: Diagnosis not present

## 2018-12-05 DIAGNOSIS — N189 Chronic kidney disease, unspecified: Secondary | ICD-10-CM | POA: Diagnosis not present

## 2018-12-05 DIAGNOSIS — M5489 Other dorsalgia: Secondary | ICD-10-CM | POA: Diagnosis not present

## 2018-12-05 DIAGNOSIS — Z7401 Bed confinement status: Secondary | ICD-10-CM | POA: Diagnosis not present

## 2018-12-05 DIAGNOSIS — E1165 Type 2 diabetes mellitus with hyperglycemia: Secondary | ICD-10-CM | POA: Diagnosis not present

## 2018-12-05 DIAGNOSIS — M5106 Intervertebral disc disorders with myelopathy, lumbar region: Secondary | ICD-10-CM | POA: Diagnosis not present

## 2018-12-05 DIAGNOSIS — E871 Hypo-osmolality and hyponatremia: Secondary | ICD-10-CM | POA: Diagnosis not present

## 2018-12-05 DIAGNOSIS — M2550 Pain in unspecified joint: Secondary | ICD-10-CM | POA: Diagnosis not present

## 2018-12-05 DIAGNOSIS — G9340 Encephalopathy, unspecified: Secondary | ICD-10-CM | POA: Diagnosis not present

## 2018-12-05 DIAGNOSIS — R0902 Hypoxemia: Secondary | ICD-10-CM | POA: Diagnosis not present

## 2018-12-05 DIAGNOSIS — G931 Anoxic brain damage, not elsewhere classified: Secondary | ICD-10-CM | POA: Diagnosis not present

## 2018-12-05 DIAGNOSIS — R58 Hemorrhage, not elsewhere classified: Secondary | ICD-10-CM | POA: Diagnosis not present

## 2018-12-05 DIAGNOSIS — Z789 Other specified health status: Secondary | ICD-10-CM | POA: Diagnosis not present

## 2018-12-05 DIAGNOSIS — M4326 Fusion of spine, lumbar region: Secondary | ICD-10-CM | POA: Diagnosis not present

## 2018-12-05 DIAGNOSIS — R41841 Cognitive communication deficit: Secondary | ICD-10-CM | POA: Diagnosis not present

## 2018-12-05 DIAGNOSIS — E662 Morbid (severe) obesity with alveolar hypoventilation: Secondary | ICD-10-CM | POA: Diagnosis not present

## 2018-12-05 DIAGNOSIS — G9349 Other encephalopathy: Secondary | ICD-10-CM | POA: Diagnosis not present

## 2018-12-05 DIAGNOSIS — M6281 Muscle weakness (generalized): Secondary | ICD-10-CM | POA: Diagnosis not present

## 2018-12-05 DIAGNOSIS — M48062 Spinal stenosis, lumbar region with neurogenic claudication: Secondary | ICD-10-CM | POA: Diagnosis not present

## 2018-12-05 DIAGNOSIS — I129 Hypertensive chronic kidney disease with stage 1 through stage 4 chronic kidney disease, or unspecified chronic kidney disease: Secondary | ICD-10-CM | POA: Diagnosis not present

## 2018-12-05 DIAGNOSIS — R2689 Other abnormalities of gait and mobility: Secondary | ICD-10-CM | POA: Diagnosis not present

## 2018-12-05 DIAGNOSIS — M47816 Spondylosis without myelopathy or radiculopathy, lumbar region: Secondary | ICD-10-CM | POA: Diagnosis not present

## 2018-12-05 DIAGNOSIS — E1122 Type 2 diabetes mellitus with diabetic chronic kidney disease: Secondary | ICD-10-CM | POA: Diagnosis not present

## 2018-12-05 DIAGNOSIS — M4316 Spondylolisthesis, lumbar region: Secondary | ICD-10-CM | POA: Diagnosis not present

## 2018-12-05 DIAGNOSIS — R278 Other lack of coordination: Secondary | ICD-10-CM | POA: Diagnosis not present

## 2018-12-05 DIAGNOSIS — M5136 Other intervertebral disc degeneration, lumbar region: Secondary | ICD-10-CM | POA: Diagnosis not present

## 2018-12-11 DIAGNOSIS — G931 Anoxic brain damage, not elsewhere classified: Secondary | ICD-10-CM | POA: Insufficient documentation

## 2018-12-11 DIAGNOSIS — E662 Morbid (severe) obesity with alveolar hypoventilation: Secondary | ICD-10-CM | POA: Insufficient documentation

## 2018-12-11 DIAGNOSIS — E1165 Type 2 diabetes mellitus with hyperglycemia: Secondary | ICD-10-CM | POA: Insufficient documentation

## 2018-12-15 DIAGNOSIS — F1021 Alcohol dependence, in remission: Secondary | ICD-10-CM | POA: Diagnosis not present

## 2018-12-15 DIAGNOSIS — E662 Morbid (severe) obesity with alveolar hypoventilation: Secondary | ICD-10-CM | POA: Diagnosis not present

## 2018-12-15 DIAGNOSIS — G4733 Obstructive sleep apnea (adult) (pediatric): Secondary | ICD-10-CM | POA: Diagnosis not present

## 2018-12-15 DIAGNOSIS — M5489 Other dorsalgia: Secondary | ICD-10-CM | POA: Diagnosis not present

## 2018-12-15 DIAGNOSIS — C61 Malignant neoplasm of prostate: Secondary | ICD-10-CM | POA: Diagnosis not present

## 2018-12-15 DIAGNOSIS — I7389 Other specified peripheral vascular diseases: Secondary | ICD-10-CM | POA: Diagnosis not present

## 2018-12-15 DIAGNOSIS — M4716 Other spondylosis with myelopathy, lumbar region: Secondary | ICD-10-CM | POA: Diagnosis not present

## 2018-12-15 DIAGNOSIS — M2550 Pain in unspecified joint: Secondary | ICD-10-CM | POA: Diagnosis not present

## 2018-12-15 DIAGNOSIS — G9589 Other specified diseases of spinal cord: Secondary | ICD-10-CM | POA: Diagnosis not present

## 2018-12-15 DIAGNOSIS — M6281 Muscle weakness (generalized): Secondary | ICD-10-CM | POA: Diagnosis not present

## 2018-12-15 DIAGNOSIS — R2681 Unsteadiness on feet: Secondary | ICD-10-CM | POA: Diagnosis not present

## 2018-12-15 DIAGNOSIS — R58 Hemorrhage, not elsewhere classified: Secondary | ICD-10-CM | POA: Diagnosis not present

## 2018-12-15 DIAGNOSIS — M961 Postlaminectomy syndrome, not elsewhere classified: Secondary | ICD-10-CM | POA: Diagnosis not present

## 2018-12-15 DIAGNOSIS — I2510 Atherosclerotic heart disease of native coronary artery without angina pectoris: Secondary | ICD-10-CM | POA: Diagnosis not present

## 2018-12-15 DIAGNOSIS — K219 Gastro-esophageal reflux disease without esophagitis: Secondary | ICD-10-CM | POA: Diagnosis not present

## 2018-12-15 DIAGNOSIS — R5381 Other malaise: Secondary | ICD-10-CM | POA: Diagnosis not present

## 2018-12-15 DIAGNOSIS — R278 Other lack of coordination: Secondary | ICD-10-CM | POA: Diagnosis not present

## 2018-12-15 DIAGNOSIS — R05 Cough: Secondary | ICD-10-CM | POA: Diagnosis not present

## 2018-12-15 DIAGNOSIS — R2689 Other abnormalities of gait and mobility: Secondary | ICD-10-CM | POA: Diagnosis not present

## 2018-12-15 DIAGNOSIS — T7840XA Allergy, unspecified, initial encounter: Secondary | ICD-10-CM | POA: Diagnosis not present

## 2018-12-15 DIAGNOSIS — Z7401 Bed confinement status: Secondary | ICD-10-CM | POA: Diagnosis not present

## 2018-12-15 DIAGNOSIS — E871 Hypo-osmolality and hyponatremia: Secondary | ICD-10-CM | POA: Diagnosis not present

## 2018-12-15 DIAGNOSIS — E8881 Metabolic syndrome: Secondary | ICD-10-CM | POA: Diagnosis not present

## 2018-12-15 DIAGNOSIS — Z789 Other specified health status: Secondary | ICD-10-CM | POA: Diagnosis not present

## 2018-12-15 DIAGNOSIS — I10 Essential (primary) hypertension: Secondary | ICD-10-CM | POA: Diagnosis not present

## 2018-12-15 DIAGNOSIS — L298 Other pruritus: Secondary | ICD-10-CM | POA: Diagnosis not present

## 2018-12-15 DIAGNOSIS — R41841 Cognitive communication deficit: Secondary | ICD-10-CM | POA: Diagnosis not present

## 2018-12-15 DIAGNOSIS — E1165 Type 2 diabetes mellitus with hyperglycemia: Secondary | ICD-10-CM | POA: Diagnosis not present

## 2018-12-19 DIAGNOSIS — E871 Hypo-osmolality and hyponatremia: Secondary | ICD-10-CM | POA: Diagnosis not present

## 2018-12-19 DIAGNOSIS — G4733 Obstructive sleep apnea (adult) (pediatric): Secondary | ICD-10-CM | POA: Diagnosis not present

## 2018-12-19 DIAGNOSIS — F1021 Alcohol dependence, in remission: Secondary | ICD-10-CM | POA: Diagnosis not present

## 2018-12-19 DIAGNOSIS — I2510 Atherosclerotic heart disease of native coronary artery without angina pectoris: Secondary | ICD-10-CM | POA: Diagnosis not present

## 2018-12-19 DIAGNOSIS — C61 Malignant neoplasm of prostate: Secondary | ICD-10-CM | POA: Diagnosis not present

## 2018-12-19 DIAGNOSIS — I7389 Other specified peripheral vascular diseases: Secondary | ICD-10-CM | POA: Diagnosis not present

## 2018-12-19 DIAGNOSIS — I10 Essential (primary) hypertension: Secondary | ICD-10-CM | POA: Diagnosis not present

## 2018-12-19 DIAGNOSIS — E8881 Metabolic syndrome: Secondary | ICD-10-CM | POA: Diagnosis not present

## 2018-12-19 DIAGNOSIS — G9589 Other specified diseases of spinal cord: Secondary | ICD-10-CM | POA: Diagnosis not present

## 2018-12-21 DIAGNOSIS — E871 Hypo-osmolality and hyponatremia: Secondary | ICD-10-CM | POA: Diagnosis not present

## 2018-12-21 DIAGNOSIS — G9589 Other specified diseases of spinal cord: Secondary | ICD-10-CM | POA: Diagnosis not present

## 2018-12-21 DIAGNOSIS — I10 Essential (primary) hypertension: Secondary | ICD-10-CM | POA: Diagnosis not present

## 2018-12-21 DIAGNOSIS — K219 Gastro-esophageal reflux disease without esophagitis: Secondary | ICD-10-CM | POA: Diagnosis not present

## 2018-12-26 DIAGNOSIS — E871 Hypo-osmolality and hyponatremia: Secondary | ICD-10-CM | POA: Diagnosis not present

## 2018-12-26 DIAGNOSIS — R05 Cough: Secondary | ICD-10-CM | POA: Diagnosis not present

## 2018-12-26 DIAGNOSIS — K219 Gastro-esophageal reflux disease without esophagitis: Secondary | ICD-10-CM | POA: Diagnosis not present

## 2018-12-26 DIAGNOSIS — L298 Other pruritus: Secondary | ICD-10-CM | POA: Diagnosis not present

## 2018-12-26 DIAGNOSIS — T7840XA Allergy, unspecified, initial encounter: Secondary | ICD-10-CM | POA: Diagnosis not present

## 2018-12-28 DIAGNOSIS — I10 Essential (primary) hypertension: Secondary | ICD-10-CM | POA: Diagnosis not present

## 2018-12-28 DIAGNOSIS — F1021 Alcohol dependence, in remission: Secondary | ICD-10-CM | POA: Diagnosis not present

## 2018-12-28 DIAGNOSIS — G9589 Other specified diseases of spinal cord: Secondary | ICD-10-CM | POA: Diagnosis not present

## 2018-12-28 DIAGNOSIS — I2510 Atherosclerotic heart disease of native coronary artery without angina pectoris: Secondary | ICD-10-CM | POA: Diagnosis not present

## 2018-12-28 DIAGNOSIS — L298 Other pruritus: Secondary | ICD-10-CM | POA: Diagnosis not present

## 2018-12-28 DIAGNOSIS — E871 Hypo-osmolality and hyponatremia: Secondary | ICD-10-CM | POA: Diagnosis not present

## 2018-12-28 DIAGNOSIS — I7389 Other specified peripheral vascular diseases: Secondary | ICD-10-CM | POA: Diagnosis not present

## 2018-12-28 DIAGNOSIS — K219 Gastro-esophageal reflux disease without esophagitis: Secondary | ICD-10-CM | POA: Diagnosis not present

## 2019-01-03 ENCOUNTER — Telehealth: Payer: Self-pay | Admitting: Internal Medicine

## 2019-01-03 NOTE — Telephone Encounter (Signed)
Pt was notified.  

## 2019-01-03 NOTE — Telephone Encounter (Signed)
Pt states he had surgery on August 25th. But he has not had an appetite since and wants to know what he can do

## 2019-01-03 NOTE — Telephone Encounter (Signed)
Try eating small frequent meals or snacks. If he is losing weight, have him schedule to see me. He can add an Ensure or Boost once daily to help with calorie and nutrient intake if needed.

## 2019-01-05 ENCOUNTER — Emergency Department (HOSPITAL_COMMUNITY): Payer: Medicare HMO

## 2019-01-05 ENCOUNTER — Emergency Department (HOSPITAL_COMMUNITY)
Admission: EM | Admit: 2019-01-05 | Discharge: 2019-01-06 | Disposition: A | Discharge status: Home / Self Care | Payer: Medicare HMO | Attending: Emergency Medicine | Admitting: Emergency Medicine

## 2019-01-05 ENCOUNTER — Other Ambulatory Visit: Payer: Self-pay

## 2019-01-05 DIAGNOSIS — M545 Low back pain: Secondary | ICD-10-CM | POA: Diagnosis not present

## 2019-01-05 DIAGNOSIS — I2510 Atherosclerotic heart disease of native coronary artery without angina pectoris: Secondary | ICD-10-CM | POA: Diagnosis not present

## 2019-01-05 DIAGNOSIS — Z87891 Personal history of nicotine dependence: Secondary | ICD-10-CM | POA: Insufficient documentation

## 2019-01-05 DIAGNOSIS — E119 Type 2 diabetes mellitus without complications: Secondary | ICD-10-CM | POA: Insufficient documentation

## 2019-01-05 DIAGNOSIS — Z8546 Personal history of malignant neoplasm of prostate: Secondary | ICD-10-CM | POA: Diagnosis not present

## 2019-01-05 DIAGNOSIS — N182 Chronic kidney disease, stage 2 (mild): Secondary | ICD-10-CM | POA: Insufficient documentation

## 2019-01-05 DIAGNOSIS — I129 Hypertensive chronic kidney disease with stage 1 through stage 4 chronic kidney disease, or unspecified chronic kidney disease: Secondary | ICD-10-CM | POA: Insufficient documentation

## 2019-01-05 DIAGNOSIS — R0600 Dyspnea, unspecified: Secondary | ICD-10-CM | POA: Diagnosis not present

## 2019-01-05 DIAGNOSIS — Z79899 Other long term (current) drug therapy: Secondary | ICD-10-CM | POA: Diagnosis not present

## 2019-01-05 DIAGNOSIS — J449 Chronic obstructive pulmonary disease, unspecified: Secondary | ICD-10-CM | POA: Insufficient documentation

## 2019-01-05 DIAGNOSIS — D500 Iron deficiency anemia secondary to blood loss (chronic): Secondary | ICD-10-CM

## 2019-01-05 DIAGNOSIS — R0602 Shortness of breath: Secondary | ICD-10-CM | POA: Diagnosis not present

## 2019-01-05 DIAGNOSIS — Z7982 Long term (current) use of aspirin: Secondary | ICD-10-CM | POA: Insufficient documentation

## 2019-01-05 LAB — BASIC METABOLIC PANEL
Anion gap: 9 (ref 5–15)
BUN: 11 mg/dL (ref 8–23)
CO2: 29 mmol/L (ref 22–32)
Calcium: 9.4 mg/dL (ref 8.9–10.3)
Chloride: 98 mmol/L (ref 98–111)
Creatinine, Ser: 1.07 mg/dL (ref 0.61–1.24)
GFR calc Af Amer: >60 mL/min (ref >60)
GFR calc non Af Amer: >60 mL/min (ref >60)
Glucose, Bld: 129 mg/dL — ABNORMAL HIGH (ref 70–99)
Potassium: 3.7 mmol/L (ref 3.5–5.1)
Sodium: 136 mmol/L (ref 135–145)

## 2019-01-05 LAB — CBC
HCT: 29.5 % — ABNORMAL LOW (ref 39.0–52.0)
Hemoglobin: 9 g/dL — ABNORMAL LOW (ref 13.0–17.0)
MCH: 21.6 pg — ABNORMAL LOW (ref 26.0–34.0)
MCHC: 30.5 g/dL (ref 30.0–36.0)
MCV: 70.9 fL — ABNORMAL LOW (ref 80.0–100.0)
Platelets: 207 10*3/uL (ref 150–400)
RBC: 4.16 MIL/uL — ABNORMAL LOW (ref 4.22–5.81)
RDW: 18 % — ABNORMAL HIGH (ref 11.5–15.5)
WBC: 6.1 10*3/uL (ref 4.0–10.5)
nRBC: 0 % (ref 0.0–0.2)

## 2019-01-05 LAB — TROPONIN I (HIGH SENSITIVITY): Troponin I (High Sensitivity): 7 ng/L (ref <18)

## 2019-01-05 NOTE — ED Triage Notes (Signed)
Patient c/o SOB that began this evening along with frequent belching. Also c/o CP without N/V.

## 2019-01-06 LAB — POC OCCULT BLOOD, ED: Fecal Occult Bld: POSITIVE — AB

## 2019-01-06 LAB — TROPONIN I (HIGH SENSITIVITY): Troponin I (High Sensitivity): 7 ng/L (ref <18)

## 2019-01-06 MED ORDER — HYDROCODONE-ACETAMINOPHEN 5-325 MG PO TABS
1.0000 | ORAL_TABLET | Freq: Once | ORAL | Status: AC
  Administered 2019-01-06: 1 via ORAL
  Filled 2019-01-06: qty 1

## 2019-01-06 NOTE — ED Notes (Signed)
Pt states he is staying until 6am and then leaving because he has been here all night

## 2019-01-06 NOTE — ED Provider Notes (Signed)
[facility] EMERGENCY DEPARTMENT Provider Note   CSN: [id number] Arrival date & time: 01/05/19  2138     History   Chief Complaint Chief Complaint  Patient presents with  . Shortness of Breath    HPI Joel [name] is a 78 y.o. male.     The history is provided by the patient.  Shortness of Breath Severity:  Moderate Onset quality:  Sudden Timing:  Constant Progression:  Resolved Chronicity:  New Relieved by:  None tried Worsened by:  Nothing Associated symptoms: no chest pain, no cough, no fever and no vomiting   Patient with history of CAD, COPD presents with shortness of breath.  He reports approximately 10 hours prior to evaluation he began having some shortness of breath at rest.  It has since resolved.  It was not exertional.  No chest pain.  No fevers or cough.  No hemoptysis.  He had otherwise been well.  He underwent lumbar fusion last month without complication.  He has been active since then without difficulty.  No dyspnea on exertion. Patient reports he is a non-smoker Past Medical History:  Diagnosis Date  . Atypical chest pain   . CAD (coronary artery disease)    a. Multiple prior evaluations then 05/2012 s/p BMS to OM2. b. Nuc 11/2012 wnl. c. Cath 2015: patent stent, moderate LAD/RCA dz, treated medically.  . Cancer ([place])    Hx: of prostate  . Cataract   . CKD (chronic kidney disease), stage II   . COPD (chronic obstructive pulmonary disease) ([place])   . Cough secondary to angiotensin converting enzyme inhibitor (ACE-I)   . Diabetes mellitus without complication ([place])   . Diverticulosis   . DVT (deep venous thrombosis) ([place])   . Family history of breast cancer in first degree relative   . GERD (gastroesophageal reflux disease)   . Headache(784.0)    Hx: of  . History of blood transfusion    "I've had 2; don't know what it was related to" (11/09/2012)  . HLD (hyperlipidemia)   . HTN (hypertension)   . Iron deficiency anemia   .  Microcytic anemia   . Peripheral vascular disease ([place])    a. L pop-tibial bypass 2011, followed by [facility].  . Personal history of prostate cancer   . Rotator cuff injury    "left arm; never repaired" (11/09/2012)    Patient Active Problem List   Diagnosis Date Noted  . Chronic low back pain 09/11/2018  . COPD (chronic obstructive pulmonary disease) ([place])   . Seasonal allergies 07/04/2018  . Chronic obstructive pulmonary disease ([place]) 07/04/2018  . Dizziness 01/20/2018  . Malaise 01/20/2018  . Decreased breath sounds 01/20/2018  . Needs flu shot 01/20/2018  . Chronic nonintractable headache 12/07/2017  . Back pain 11/01/2017  . Radicular pain of lower extremity 11/01/2017  . Prostate cancer ([place]) 11/01/2017  . Family history of breast cancer in first degree relative   . Personal history of prostate cancer   . Elliptocytosis on peripheral blood smear ([place]) 09/13/2016  . Prediabetes 03/22/2016  . PVD (peripheral vascular disease) with claudication ([place]) 11/15/2012  . Chest pain, localized 11/10/2012  . Hypokalemia 11/10/2012  . Bradycardia 11/09/2012  . Thrombocytopenia ([place]) 06/01/2012  . Microcytic anemia   . Atherosclerosis of native arteries of the extremities with intermittent claudication 06/16/2011  . Stable angina ([place]) 06/11/2011  . CAD (coronary artery disease)   . Chest pain, atypical 05/30/2011  . Iron deficiency anemia 10/03/2009  . DIVERTICULOSIS  OF COLON 10/03/2009  . WEIGHT LOSS, ABNORMAL 10/03/2009  . CHEST PAIN UNSPECIFIED 09/13/2008  . Headache(784.0) 08/19/2008  . TIA 06/05/2008  . SHOULDER PAIN, LEFT 10/11/2007  . COUGH DUE TO ACE INHIBITORS 05/07/2007  . History of tobacco use disorder 04/25/2007  . Hyperlipidemia 04/21/2007  . Essential hypertension 04/21/2007  . GERD 04/21/2007    Past Surgical History:  Procedure Laterality Date  . CARDIOVASCULAR STRESS TEST  Aug. 2014  . CATARACT EXTRACTION W/ INTRAOCULAR LENS IMPLANT Right ~ 2008  .  CIRCUMCISION    . COLONOSCOPY    . CORONARY ANGIOPLASTY WITH STENT PLACEMENT  2014   "1" (11/09/2012)  . ILIAC ARTERY ANEURYSM REPAIR    . LEFT HEART CATH  05-31-12  . LEFT HEART CATHETERIZATION WITH CORONARY ANGIOGRAM N/A 06/01/2011   Procedure: LEFT HEART CATHETERIZATION WITH CORONARY ANGIOGRAM;  Surgeon: Hillary Bow, MD;  Location: [facility] CATH LAB;  Service: Cardiovascular;  Laterality: N/A;  . LEFT HEART CATHETERIZATION WITH CORONARY ANGIOGRAM N/A 05/31/2012   Procedure: LEFT HEART CATHETERIZATION WITH CORONARY ANGIOGRAM;  Surgeon: Peter M Martinique, MD;  Location: [facility] CATH LAB;  Service: Cardiovascular;  Laterality: N/A;  . LEFT HEART CATHETERIZATION WITH CORONARY ANGIOGRAM N/A 08/01/2013   Procedure: LEFT HEART CATHETERIZATION WITH CORONARY ANGIOGRAM;  Surgeon: Burnell Blanks, MD;  Location: [facility] CATH LAB;  Service: Cardiovascular;  Laterality: N/A;  . PERCUTANEOUS CORONARY STENT INTERVENTION (PCI-S)  05/31/2012   Procedure: PERCUTANEOUS CORONARY STENT INTERVENTION (PCI-S);  Surgeon: Peter M Martinique, MD;  Location: [facility] CATH LAB;  Service: Cardiovascular;;  . PR VEIN BYPASS GRAFT,AORTO-FEM-POP Left 10/01/2009   left below knee popliteal artery to posterior tibial artery  . SHOULDER ARTHROSCOPY WITH OPEN ROTATOR CUFF REPAIR AND DISTAL CLAVICLE ACROMINECTOMY Left 01/12/2013   Procedure: LEFT SHOULDER ARTHROSCOPY WITH DEBRIDEMENT OPEN DISTAL CLAVICLE RESECTION ,acromioplastyAND ROTATOR CUFF REPAIR;  Surgeon: Yvette Rack., MD;  Location: [place];  Service: Orthopedics;  Laterality: Left;        Home Medications    Prior to Admission medications   Medication Sig Start Date End Date Taking? Authorizing Provider  abiraterone Acetate (ZYTIGA) 250 MG tablet Take 1,000 mg by mouth daily before breakfast. Take on an empty stomach 1 hour before or 2 hours after a meal     [provider]  Accu-Chek FastClix Lancets MISC Test once daily 11/23/18   Henson, Vickie L, NP-C  albuterol (PROVENTIL  HFA;VENTOLIN HFA) 108 (90 Base) MCG/ACT inhaler Inhale 2 puffs into the lungs every 6 (six) hours as needed for wheezing or shortness of breath. 07/04/18   Henson, Vickie L, NP-C  amLODipine (NORVASC) 10 MG tablet TAKE 1 TABLET BY MOUTH EVERY DAY 11/09/18   Henson, Vickie L, NP-C  aspirin EC 81 MG tablet Take 81 mg by mouth daily.     [provider]  atorvastatin (LIPITOR) 40 MG tablet Take 40 mg by mouth daily.    [provider]  brimonidine (ALPHAGAN) 0.2 % ophthalmic solution Place 1 drop into the right eye 2 (two) times daily.  09/13/17   [provider]  diclofenac sodium (VOLTAREN) 1 % GEL Apply 2 g topically 4 (four) times daily as needed (back pain).     [provider]  ferrous sulfate 325 (65 FE) MG tablet Take 325 mg by mouth once a week.     [provider]  fluticasone (FLONASE) 50 MCG/ACT nasal spray SHAKE LIQUID AND USE 2 SPRAYS IN [facility] NOSTRIL DAILY 10/23/18   Henson, Vickie L,  NP-C  gabapentin (NEURONTIN) 600 MG tablet Take 600 mg by mouth 2 (two) times daily.     [provider]  glucose blood test strip Once daily Accu-Chek Guide 11/23/18   Henson, Vickie L, NP-C  isosorbide mononitrate (IMDUR) 30 MG 24 hr tablet Take 30 mg by mouth daily.    [provider]  Lidocaine 5 % CREA Apply 1 application topically at bedtime as needed (back pain).    [provider]  loratadine (CLARITIN) 10 MG tablet Take 10 mg by mouth daily.    [provider]  losartan-hydrochlorothiazide (HYZAAR) 100-25 MG tablet TAKE 1 TABLET BY MOUTH DAILY 07/24/18   Henson, Vickie L, NP-C  methocarbamol (ROBAXIN) 500 MG tablet Take 1 tablet (500 mg total) by mouth every 8 (eight) hours as needed for muscle spasms. Patient not taking: Reported on 11/23/2018 11/18/18   Davonna Belling, MD  nitroGLYCERIN (NITROSTAT) 0.4 MG SL tablet Place 1 tablet (0.4 mg total) under the tongue every 5 (five) minutes as needed for chest pain. 04/27/18    Lyda Jester M, PA-C  omeprazole (PRILOSEC) 20 MG capsule Take 20 mg by mouth 2 (two) times daily before a meal.    [provider]  predniSONE (DELTASONE) 5 MG tablet Take 5 mg by mouth daily before breakfast.     [provider]  traMADol (ULTRAM) 50 MG tablet TAKE 1 TABLET(50 MG) BY MOUTH EVERY 8 HOURS AS NEEDED FOR SEVERE PAIN Patient taking differently: Take 50 mg by mouth every 8 (eight) hours as needed for severe pain.  09/11/18   Girtha Rm, NP-C    Family History Family History  Problem Relation Age of Onset  . Breast cancer Mother        dx 42s  . Hyperlipidemia Mother   . Hypertension Mother   . COPD Father        deceased  . Hyperlipidemia Father   . Hypertension Father   . Hypertension Sister   . Cancer Brother 46       Unknown cancer, possibly stomach  . Hypertension Brother   . Cancer Brother 85       Unknown, possibly lung  . Hypertension Daughter   . Hypertension Son   . Colon cancer Neg Hx        pt is unclear about family hx    Social History Social History   Tobacco Use  . Smoking status: Former Smoker    Packs/day: 0.50    Years: 52.00    Pack years: 26.00    Types: Cigarettes    Quit date: 05/26/2011    Years since quitting: 7.6  . Smokeless tobacco: Never Used  Substance Use Topics  . Alcohol use: No  . Drug use: No     Allergies   Topiramate, Atenolol, Lisinopril, and Tramadol-acetaminophen   Review of Systems Review of Systems  Constitutional: Negative for fever.  Respiratory: Positive for shortness of breath. Negative for cough.   Cardiovascular: Negative for chest pain and leg swelling.  Gastrointestinal: Negative for blood in stool and vomiting.       Denies bloody stool, reports recent dark stool  All other systems reviewed and are negative.    Physical Exam Updated Vital Signs BP (!) 162/72 (BP Location: Right Arm) Comment: Simultaneous filing. User may not have seen previous data.  Pulse 67    Temp 97.9 F (36.6 C) (Oral)   Resp 14   Ht 1.702 m (5\' 7" )   Wt  108 kg   SpO2 96%   BMI 37.28 kg/m   Physical Exam  CONSTITUTIONAL: Well developed/well nourished HEAD: Normocephalic/atraumatic EYES: EOMI/PERRL ENMT: Mucous membranes moist NECK: supple no meningeal signs, no JVD SPINE/BACK:entire spine nontender CV: S1/S2 noted, no murmurs/rubs/gallops noted LUNGS: Lungs are clear to auscultation bilaterally, no apparent distress ABDOMEN: soft, nontender, no rebound or guarding, bowel sounds noted throughout abdomen GU:no cva tenderness NEURO: Pt is awake/alert/appropriate, moves all extremitiesx4.  No facial droop.   EXTREMITIES: pulses normal/equal, full ROM, no lower extreme edema or tenderness SKIN: warm, color normal PSYCH: no abnormalities of mood noted, alert and oriented to situation  ED Treatments / Results  Labs (all labs ordered are listed, but only abnormal results are displayed) Labs Reviewed  BASIC METABOLIC PANEL - Abnormal; Notable for the following components:      Result Value   Glucose, Bld 129 (*)    All other components within normal limits  CBC - Abnormal; Notable for the following components:   RBC 4.16 (*)    Hemoglobin 9.0 (*)    HCT 29.5 (*)    MCV 70.9 (*)    MCH 21.6 (*)    RDW 18.0 (*)    All other components within normal limits  POC OCCULT BLOOD, ED - Abnormal; Notable for the following components:   Fecal Occult Bld POSITIVE (*)    All other components within normal limits  TROPONIN I (HIGH SENSITIVITY)  TROPONIN I (HIGH SENSITIVITY)    EKG ED ECG REPORT   Date: 01/05/2019 2158  Rate: 62  Rhythm: normal sinus rhythm  QRS Axis: normal  Intervals: normal  ST/T Wave abnormalities: normal  Conduction Disutrbances:none  Narrative Interpretation: PVC noted  Old EKG Reviewed: unchanged  I have personally reviewed the EKG tracing and agree with the computerized printout as noted.   EKG Interpretation  Date/Time:  Saturday  January 06 2019 05:41:36 EDT Ventricular Rate:  59 PR Interval:    QRS Duration: 88 QT Interval:  429 QTC Calculation: 425 R Axis:   33 Text Interpretation:  Sinus rhythm Ventricular premature complex Prolonged PR interval RSR' in V1 or V2, right VCD or RVH Interpretation limited secondary to artifact Confirmed by Ripley Fraise [phone]) on 01/06/2019 5:45:11 AM        Radiology Dg Chest 2 View  Result Date: 01/05/2019 CLINICAL DATA:  Shortness of breath EXAM: CHEST - 2 VIEW COMPARISON:  12/08/2018 FINDINGS: Heart and mediastinal contours are within normal limits. No focal opacities or effusions. No acute bony abnormality. IMPRESSION: No active cardiopulmonary disease. Electronically Signed   By: Rolm Baptise M.D.   On: 01/05/2019 22:20    Procedures Procedures    Medications Ordered in ED Medications  HYDROcodone-acetaminophen (NORCO/VICODIN) 5-325 MG per tablet 1 tablet (has no administration in time range)     Initial Impression / Assessment and Plan / ED Course  I have reviewed the triage vital signs and the nursing notes.  Pertinent labs & imaging results that were available during my care of the patient were reviewed by me and considered in my medical decision making (see chart for details).       6:47 AM Patient presented with shortness of breath prior to arrival that has since resolved.  He had no hypoxia and walked around the ER in no distress.  He had no chest pain (nursing documented chest pain, but he denied on my exam) He was overall well-appearing. Low suspicion for ACS/PE. Chest x-ray was reviewed and is negative  On review of systems, he did mention recent dark stools.  His stool here in the ER was brown, but Hemoccult positive.  He did have a small drop in his hemoglobin to 9.  It is unclear how acute this is.  He does not currently show signs of an acute GI bleed. He has had colonoscopies, EGD in 2018 by Dr. Carlean Purl. Patient prefers to be discharged.  He  will start iron supplementation.  He has PCP follow-up on September 30.  I advised that he would likely need a repeat CBC at that date, and may need further GI consultation. He feels comfortable this plan, we discussed strict ER return precautions.  Final Clinical Impressions(s) / ED Diagnoses   Final diagnoses:  Dyspnea, unspecified type  Iron deficiency anemia due to chronic blood loss    ED Discharge Orders    None       Ripley Fraise, MD 01/06/19 [phone]

## 2019-01-06 NOTE — Discharge Instructions (Addendum)
Be sure to start taking an iron pill from the pharmacy every day.  Please follow-up at your next appointment. If you start having any more black or bloody stools you will need to return to the hospital

## 2019-01-06 NOTE — ED Notes (Signed)
Discharge instructions discussed with pt. Pt verbalized understanding. Pt stable and ambulatory. No signature pad available. 

## 2019-01-10 ENCOUNTER — Ambulatory Visit (INDEPENDENT_AMBULATORY_CARE_PROVIDER_SITE_OTHER): Payer: Medicare HMO | Admitting: Family Medicine

## 2019-01-10 ENCOUNTER — Encounter: Payer: Self-pay | Admitting: Family Medicine

## 2019-01-10 ENCOUNTER — Other Ambulatory Visit: Payer: Self-pay

## 2019-01-10 VITALS — BP 120/80 | HR 72 | Temp 98.4°F | Wt 237.2 lb

## 2019-01-10 DIAGNOSIS — R7303 Prediabetes: Secondary | ICD-10-CM

## 2019-01-10 DIAGNOSIS — R400 Somnolence: Secondary | ICD-10-CM | POA: Insufficient documentation

## 2019-01-10 DIAGNOSIS — G4719 Other hypersomnia: Secondary | ICD-10-CM

## 2019-01-10 DIAGNOSIS — Z9189 Other specified personal risk factors, not elsewhere classified: Secondary | ICD-10-CM

## 2019-01-10 DIAGNOSIS — Z23 Encounter for immunization: Secondary | ICD-10-CM

## 2019-01-10 DIAGNOSIS — D649 Anemia, unspecified: Secondary | ICD-10-CM | POA: Diagnosis not present

## 2019-01-10 LAB — CBC WITH DIFFERENTIAL/PLATELET
Basophils Absolute: 0 10*3/uL (ref 0.0–0.2)
Basos: 1 %
EOS (ABSOLUTE): 0.2 10*3/uL (ref 0.0–0.4)
Eos: 3 %
Hematocrit: 27.5 % — ABNORMAL LOW (ref 37.5–51.0)
Hemoglobin: 8.3 g/dL — ABNORMAL LOW (ref 13.0–17.7)
Immature Grans (Abs): 0 10*3/uL (ref 0.0–0.1)
Immature Granulocytes: 1 %
Lymphocytes Absolute: 0.6 10*3/uL — ABNORMAL LOW (ref 0.7–3.1)
Lymphs: 11 %
MCH: 21.1 pg — ABNORMAL LOW (ref 26.6–33.0)
MCHC: 30.2 g/dL — ABNORMAL LOW (ref 31.5–35.7)
MCV: 70 fL — ABNORMAL LOW (ref 79–97)
Monocytes Absolute: 0.3 10*3/uL (ref 0.1–0.9)
Monocytes: 6 %
Neutrophils Absolute: 4.5 10*3/uL (ref 1.4–7.0)
Neutrophils: 78 %
Platelets: 198 10*3/uL (ref 150–450)
RBC: 3.94 x10E6/uL — ABNORMAL LOW (ref 4.14–5.80)
RDW: 17.1 % — ABNORMAL HIGH (ref 11.6–15.4)
WBC: 5.6 10*3/uL (ref 3.4–10.8)

## 2019-01-10 LAB — COMPREHENSIVE METABOLIC PANEL
ALT: 14 IU/L (ref 0–44)
AST: 9 IU/L (ref 0–40)
Albumin/Globulin Ratio: 1.7 (ref 1.2–2.2)
Albumin: 3.8 g/dL (ref 3.7–4.7)
Alkaline Phosphatase: 104 IU/L (ref 39–117)
BUN/Creatinine Ratio: 11 (ref 10–24)
BUN: 12 mg/dL (ref 8–27)
Bilirubin Total: 0.2 mg/dL (ref 0.0–1.2)
CO2: 26 mmol/L (ref 20–29)
Calcium: 9.3 mg/dL (ref 8.6–10.2)
Chloride: 101 mmol/L (ref 96–106)
Creatinine, Ser: 1.13 mg/dL (ref 0.76–1.27)
GFR calc Af Amer: 71 mL/min/{1.73_m2} (ref >59)
GFR calc non Af Amer: 61 mL/min/{1.73_m2} (ref >59)
Globulin, Total: 2.2 g/dL (ref 1.5–4.5)
Glucose: 150 mg/dL — ABNORMAL HIGH (ref 65–99)
Potassium: 3.8 mmol/L (ref 3.5–5.2)
Sodium: 142 mmol/L (ref 134–144)
Total Protein: 6 g/dL (ref 6.0–8.5)

## 2019-01-10 LAB — POCT GLYCOSYLATED HEMOGLOBIN (HGB A1C): Hemoglobin A1C: 6.5 % — AB (ref 4.0–5.6)

## 2019-01-10 NOTE — Progress Notes (Signed)
   Subjective:    [name], male    DOB: 1941-01-15, 78 y.o.   MRN: [id number]  HPI Chief Complaint  Patient presents with  . follow-up from surgery    follow-up from surgery. conspitated alot, eating is coming back.    Here today to follow up on chronic health conditions.  Recent back surgery on 12/05/2018 by Dr. Patrice Paradise. States he is doing fine. He had his follow up with the surgeon.   States he was having issues with his appetite right after surgery. States appetite is coming back and he is gaining the weight back that he lost.   Recent visit to the ED for dyspnea and his Hgb was trending downward. He also had a positive hemoccult in the ED. He denies seeing any blood in his stool or on toilet tissue.  He started on iron as recommended and is now having issues with constipation.  History of anemia and has seen hematology.  Last colonoscopy: March 2019 at [facility]. States he already called them to schedule a visit.   History of borderline diabetes with Hgb A1c 6.5. he is due for a repeat A1c.   States he was told by his surgeon that he should be screened for sleep apnea. His oxygen level dropped while in the OR. States he can go to sleep very easily when he sits down during the day.   Denies fever, chills, fatigue, headache, dizziness, chest pain, palpitations, shortness of breath, abdominal pain, N/V, urinary symptoms, LE edema.   Reviewed allergies, medications, past medical, surgical, family, and social history.    Review of Systems Pertinent positives and negatives in the history of present illness.     Objective:   Physical Exam BP 120/80   Pulse 72   Temp 98.4 F (36.9 C)   Wt 237 lb 3.2 oz (107.6 kg)   BMI 37.15 kg/m   Alert and oriented and in no distress.  Cardiac exam shows a regular rhythm without murmurs or gallops. Lungs are clear to auscultation. Extremities without edema. Skin is warm and dry, no pallor.       Assessment &  Plan:  Anemia, unspecified type - Plan: CBC with Differential/Platelet, Comprehensive metabolic panel, Iron, TIBC and Ferritin Panel -history of anemia. +hemoccult in the ED last week. He has already contacted his GI. Continue on oral iron and add a stool softener.  Recheck CBC and iron studies. Follow up pending results.   Prediabetes - Plan: HgB A1c, Comprehensive metabolic panel -Hgb 123456 is consistent at 6.5% as it has been over the past year or longer.   Needs flu shot - Plan: Flu Vaccine QUAD High Dose(Fluad)  At risk for sleep apnea- he was told by his back surgeon that his oxygen level dropped and due to his excessive daytime sleepiness, I suspect he may have OSA. Home sleep test ordered today.   Excessive daytime sleepiness- needs HST to rule out OSA.

## 2019-01-10 NOTE — Patient Instructions (Addendum)
Continue taking the oral iron daily. Add a stool softener with it to help with constipation.   Call and check with your insurance regarding whether you want me to order a sleep study or you want to go through the [place] for this.  Let them know you need a sleep study due to being high risk for sleep apnea. I am happy to order this if you do not want to go through the [place] for it. Let me know.   Also, ask about your vaccine history and when you had the pneumonia vaccines. You should have had a Prevnar 13 and a pneumovax 23.   I am checking your blood counts and iron studies. You may need to see your GI doctor or go back to see the hematologist.   We will be in touch.

## 2019-01-11 ENCOUNTER — Other Ambulatory Visit: Payer: Self-pay | Admitting: Family Medicine

## 2019-01-11 DIAGNOSIS — D649 Anemia, unspecified: Secondary | ICD-10-CM

## 2019-01-11 DIAGNOSIS — R195 Other fecal abnormalities: Secondary | ICD-10-CM

## 2019-01-11 LAB — IRON,TIBC AND FERRITIN PANEL
Ferritin: 252 ng/mL (ref 30–400)
Iron Saturation: 19 % (ref 15–55)
Iron: 45 ug/dL (ref 38–169)
Total Iron Binding Capacity: 240 ug/dL — ABNORMAL LOW (ref 250–450)
UIBC: 195 ug/dL (ref 111–343)

## 2019-01-15 DIAGNOSIS — H11153 Pinguecula, bilateral: Secondary | ICD-10-CM | POA: Diagnosis not present

## 2019-01-15 DIAGNOSIS — H18413 Arcus senilis, bilateral: Secondary | ICD-10-CM | POA: Diagnosis not present

## 2019-01-15 DIAGNOSIS — H47231 Glaucomatous optic atrophy, right eye: Secondary | ICD-10-CM | POA: Diagnosis not present

## 2019-01-15 DIAGNOSIS — H401230 Low-tension glaucoma, bilateral, stage unspecified: Secondary | ICD-10-CM | POA: Diagnosis not present

## 2019-01-15 DIAGNOSIS — H04123 Dry eye syndrome of bilateral lacrimal glands: Secondary | ICD-10-CM | POA: Diagnosis not present

## 2019-01-15 DIAGNOSIS — H01026 Squamous blepharitis left eye, unspecified eyelid: Secondary | ICD-10-CM | POA: Diagnosis not present

## 2019-01-15 DIAGNOSIS — H01023 Squamous blepharitis right eye, unspecified eyelid: Secondary | ICD-10-CM | POA: Diagnosis not present

## 2019-01-15 DIAGNOSIS — H11823 Conjunctivochalasis, bilateral: Secondary | ICD-10-CM | POA: Diagnosis not present

## 2019-01-16 ENCOUNTER — Other Ambulatory Visit (INDEPENDENT_AMBULATORY_CARE_PROVIDER_SITE_OTHER): Payer: Medicare HMO

## 2019-01-16 ENCOUNTER — Encounter: Payer: Self-pay | Admitting: Nurse Practitioner

## 2019-01-16 ENCOUNTER — Ambulatory Visit: Payer: Medicare HMO | Admitting: Nurse Practitioner

## 2019-01-16 ENCOUNTER — Other Ambulatory Visit: Payer: Self-pay

## 2019-01-16 VITALS — BP 112/62 | HR 64 | Temp 98.0°F | Ht 69.0 in | Wt 234.1 lb

## 2019-01-16 DIAGNOSIS — R195 Other fecal abnormalities: Secondary | ICD-10-CM | POA: Diagnosis not present

## 2019-01-16 DIAGNOSIS — D509 Iron deficiency anemia, unspecified: Secondary | ICD-10-CM

## 2019-01-16 DIAGNOSIS — K5900 Constipation, unspecified: Secondary | ICD-10-CM

## 2019-01-16 LAB — HEMOGLOBIN AND HEMATOCRIT, BLOOD
HCT: 28.5 % — ABNORMAL LOW (ref 39.0–52.0)
Hemoglobin: 8.7 g/dL — ABNORMAL LOW (ref 13.0–17.0)

## 2019-01-16 NOTE — Progress Notes (Signed)
Chief Complaint:    Anemia  IMPRESSION and PLAN:    44.  78 year old male with chronic microcytic anemia evaluated by the [facility] in March 2019.  EGD reportedly normal.  A couple of polyps were removed at time of colonoscopy, path report not found in epic. Now FOBT positive with slight worsening of anemia from baseline of ~ 10 in June >>> 8.3 late September between which time he had back surgery ( late August). Though MCV low, iron studies not conclusive for iron deficiency anemia. It should also be noted that patient stopped iron supplements several months ago, restarting them only 3 weeks ago.  -Recheck H&H today -will try and obtain polyp path results from the [place] from colonoscopy March 2019 -If hgb not improving then consider small bowel capsule study -already on PPI for GERD.   2. Chronic constipation.  He tried MiraLAX a few times but not on a regular basis.  Prefers something and non-liquid form for constipation. -FiberCon capsules as directed. -Admits to inadequate hydration.  Encouraged 6 to 8 glasses of water daily -If constipation persists following above measures then he will add daily MiraLAX  3. GERD, asymptomatic on BID PPI  HPI:     Patient is a 78 year old male with pmh significant for prostate cancer, CAD s/p stent in 2014, CKD 2, COPD and DM2.  We last saw him in February 2018 for evaluation of weight loss and chest pain. EGD with biopsies following that visit was remarkable only for acute gastritis. We recommended CT AP for further evaluation but he chose to hold off.   Patient has now been sent here by PCP Joel [name] for evaluation of anemia and positive FOBT (in ED).  Has chronic microcytic anemia with baseline hemoglobin ~ 10. Seen in ED 9/25 for SOB, hgb was 9,  he was heme positive on exam.  Patient had a post ED visit follow-up with his PCP on 01/10/2019, repeat hemoglobin at that time was 8.3 but ferritin was 252 and TIBC was low. No overt GI blood loss.   He was on iron,  stopped it about 6 months ago the restarted it approximately 3 weeks ago.  It should also be noted that patient had back surgery in late August.  Denies NSAID use other than daily baby aspirin.  Prednisone on home med list but has not taken it in the recent past. He is on twice daily PPI for GERD  Patient was worked up for iron deficiency anemia by the [facility] late March 2019.  EGD was reportedly normal.  He had a colonoscopy with adequate bowel prep.  A 7 to 8 mm polyp was removed from the ICV and a 2 mm ascending colon polyp was removed.  Path reports not in the computer but the actual procedure reports are scanned in under "procedures".   Joel [name] has no GI complaints other than constipation.  He has tried MiraLAX a few times but prefers to take something for constipation in a non-liquid form and did not really think the MiraLAX helped much anyway though he never took it on a regular basis.   Review of systems:     No chest pain, no fevers, no urinary sx   Past Medical History:  Diagnosis Date  . Atypical chest pain   . CAD (coronary artery disease)    a. Multiple prior evaluations then 05/2012 s/p BMS to OM2. b. Nuc 11/2012 wnl. c. Cath 2015: patent stent, moderate  LAD/RCA dz, treated medically.  . Cancer (Joel [name])    Hx: of prostate  . Cataract   . CKD (chronic kidney disease), stage II   . COPD (chronic obstructive pulmonary disease) ([place])   . Cough secondary to angiotensin converting enzyme inhibitor (ACE-I)   . Diabetes mellitus without complication ([place])   . Diverticulosis   . DVT (deep venous thrombosis) ([place])   . Family history of breast cancer in first degree relative   . GERD (gastroesophageal reflux disease)   . Headache(784.0)    Hx: of  . History of blood transfusion    "I've had 2; don't know what it was related to" (11/09/2012)  . HLD (hyperlipidemia)   . HTN (hypertension)   . Iron deficiency anemia   . Microcytic anemia   . Peripheral vascular  disease ([place])    a. L pop-tibial bypass 2011, followed by [facility].  . Personal history of prostate cancer   . Rotator cuff injury    "left arm; never repaired" (11/09/2012)    Patient's surgical history, family medical history, social history, medications and allergies were all reviewed in Epic   Serum creatinine: 1.13 mg/dL 01/10/19 1056 Estimated creatinine clearance: 63.7 mL/min  Current Outpatient Medications  Medication Sig Dispense Refill  . Accu-Chek FastClix Lancets MISC Test once daily 102 each 12  . albuterol (PROVENTIL HFA;VENTOLIN HFA) 108 (90 Base) MCG/ACT inhaler Inhale 2 puffs into the lungs every 6 (six) hours as needed for wheezing or shortness of breath. 1 Inhaler 1  . amLODipine (NORVASC) 10 MG tablet TAKE 1 TABLET BY MOUTH EVERY DAY 90 tablet 0  . aspirin EC 81 MG tablet Take 81 mg by mouth daily.     . brimonidine (ALPHAGAN) 0.2 % ophthalmic solution Place 1 drop into the right eye 2 (two) times daily.   0  . ferrous sulfate 325 (65 FE) MG tablet Take 325 mg by mouth once a week.     . gabapentin (NEURONTIN) 600 MG tablet Take 600 mg by mouth 2 (two) times daily.     [place] Kitchen glucose blood test strip Once daily Accu-Chek Guide 100 each 12  . isosorbide mononitrate (IMDUR) 30 MG 24 hr tablet Take 30 mg by mouth daily.    Mar[place] Kitchen loratadine (CLARITIN) 10 MG tablet Take 10 mg by mouth daily.    Marland Kitchen losartan-hydrochlorothiazide (HYZAAR) 100-25 MG tablet TAKE 1 TABLET BY MOUTH DAILY 90 tablet 1  . omeprazole (PRILOSEC) 20 MG capsule Take 20 mg by mouth 2 (two) times daily before a meal.    . traMADol (ULTRAM) 50 MG tablet TAKE 1 TABLET(50 MG) BY MOUTH EVERY 8 HOURS AS NEEDED FOR SEVERE PAIN (Patient taking differently: Take 50 mg by mouth every 8 (eight) hours as needed for severe pain. ) 30 tablet 0  . atorvastatin (LIPITOR) 40 MG tablet Take 40 mg by mouth daily.    . methocarbamol (ROBAXIN) 500 MG tablet Take 1 tablet (500 mg total) by mouth every 8 (eight) hours as needed for muscle  spasms. (Patient not taking: Reported on 11/23/2018) 8 tablet 0   No current facility-administered medications for this visit.     Physical Exam:     BP 112/62 (BP Location: Left Arm, Patient Position: Sitting, Cuff Size: Normal)   Pulse 64   Temp 98 F (36.7 C) (Oral)   Ht 5\' 9"  (1.753 m) Comment: measured today 1 month post op back surgery  Wt 234 lb 1 oz (106.2 kg)   BMI 34.56 kg/m  GENERAL:  Pleasant male in NAD PSYCH: : Cooperative, normal affect EENT:  conjunctiva pink, mucous membranes moist, neck supple without masses CARDIAC:  RRR, no peripheral edema PULM: Normal respiratory effort, lungs CTA bilaterally, no wheezing ABDOMEN:  Nondistended, soft, nontender. No obvious masses, no hepatomegaly,  normal bowel sounds SKIN:  turgor, no lesions seen Musculoskeletal:  Normal muscle tone, normal strength NEURO: Alert and oriented x 3, no focal neurologic deficits  I spent 25 minutes of face-to-face time with the patient. Greater than 50% of the time was spent counseling and coordinating care. Questions answered  Joel [name] , [name] 01/16/2019, 9:41 AM   Cc:  Joel [name]

## 2019-01-16 NOTE — Patient Instructions (Addendum)
If you are age 79 or older, your body mass index should be between 23-30. Your Body mass index is 34.56 kg/m. If this is out of the aforementioned range listed, please consider follow up with your Primary Care Provider.  If you are age 55 or younger, your body mass index should be between 19-25. Your Body mass index is 34.56 kg/m. If this is out of the aformentioned range listed, please consider follow up with your Primary Care Provider.   Your provider has requested that you go to the basement level for lab work before leaving today. Press "B" on the elevator. The lab is located at the first door on the left as you exit the elevator.  Continue Prilosec.  FiberCon Daily.  Increase water to 8 8 ounce glasses daily.  If not better after 7-10 days add Miralax daily.  We are requesting Colonoscopy and path report from the [place].  Follow up with me on February 07, 2019 at 9 am.  Thank you for choosing me and [facility].   Tye Savoy, NP

## 2019-01-17 ENCOUNTER — Other Ambulatory Visit: Payer: Self-pay

## 2019-01-17 ENCOUNTER — Emergency Department (HOSPITAL_COMMUNITY)
Admission: EM | Admit: 2019-01-17 | Discharge: 2019-01-17 | Disposition: A | Discharge status: Home / Self Care | Payer: Medicare HMO | Attending: Emergency Medicine | Admitting: Emergency Medicine

## 2019-01-17 ENCOUNTER — Encounter (HOSPITAL_COMMUNITY): Payer: Self-pay

## 2019-01-17 ENCOUNTER — Ambulatory Visit (HOSPITAL_COMMUNITY)
Admission: EM | Admit: 2019-01-17 | Discharge: 2019-01-17 | Disposition: A | Discharge status: Home / Self Care | Payer: Medicare HMO | Source: Home / Self Care

## 2019-01-17 ENCOUNTER — Encounter: Payer: Self-pay | Admitting: Nurse Practitioner

## 2019-01-17 DIAGNOSIS — K209 Esophagitis, unspecified without bleeding: Secondary | ICD-10-CM

## 2019-01-17 DIAGNOSIS — I2510 Atherosclerotic heart disease of native coronary artery without angina pectoris: Secondary | ICD-10-CM | POA: Insufficient documentation

## 2019-01-17 DIAGNOSIS — R070 Pain in throat: Secondary | ICD-10-CM | POA: Diagnosis present

## 2019-01-17 DIAGNOSIS — I129 Hypertensive chronic kidney disease with stage 1 through stage 4 chronic kidney disease, or unspecified chronic kidney disease: Secondary | ICD-10-CM | POA: Diagnosis not present

## 2019-01-17 DIAGNOSIS — Z87891 Personal history of nicotine dependence: Secondary | ICD-10-CM | POA: Insufficient documentation

## 2019-01-17 DIAGNOSIS — Z79899 Other long term (current) drug therapy: Secondary | ICD-10-CM | POA: Diagnosis not present

## 2019-01-17 DIAGNOSIS — N182 Chronic kidney disease, stage 2 (mild): Secondary | ICD-10-CM | POA: Diagnosis not present

## 2019-01-17 DIAGNOSIS — J449 Chronic obstructive pulmonary disease, unspecified: Secondary | ICD-10-CM | POA: Insufficient documentation

## 2019-01-17 DIAGNOSIS — Z7982 Long term (current) use of aspirin: Secondary | ICD-10-CM | POA: Insufficient documentation

## 2019-01-17 MED ORDER — HYOSCYAMINE SULFATE 0.125 MG SL SUBL
0.2500 mg | SUBLINGUAL_TABLET | Freq: Once | SUBLINGUAL | Status: AC
  Administered 2019-01-17: 0.25 mg via SUBLINGUAL
  Filled 2019-01-17: qty 2

## 2019-01-17 NOTE — Discharge Instructions (Addendum)
Use Maalox or Mylanta as needed for discomfort Your symptoms should resolve in the next few days

## 2019-01-17 NOTE — ED Triage Notes (Signed)
Patient here for difficulty swallowing after taking a fiber supplement x 1 last night. Patient was directed to take same by GI MD yesterday. Patient with no trouble handling secretions and eating and drinking normally, NAD

## 2019-01-17 NOTE — ED Notes (Signed)
Patient verbalized understanding of dc instructions, vss, ambulatory with nad.   

## 2019-01-17 NOTE — ED Provider Notes (Signed)
[place] EMERGENCY DEPARTMENT Provider Note   CSN: [id number] Arrival date & time: 01/17/19  1801     History   Chief Complaint No chief complaint on file.   HPI PEYTEN ARNETTE is a 78 y.o. male.     78 year old male presents with pain with swallowing after taking a large pill.  Denies any emesis.  No fever or chills.  Is able to swallow his secretions as well as eat solids and liquids.  No treatment use prior to arrival.  Denies any history of esophageal stricture.     Past Medical History:  Diagnosis Date  . Atypical chest pain   . CAD (coronary artery disease)    a. Multiple prior evaluations then 05/2012 s/p BMS to OM2. b. Nuc 11/2012 wnl. c. Cath 2015: patent stent, moderate LAD/RCA dz, treated medically.  . Cancer ([place])    Hx: of prostate  . Cataract   . CKD (chronic kidney disease), stage II   . COPD (chronic obstructive pulmonary disease) ([place])   . Cough secondary to angiotensin converting enzyme inhibitor (ACE-I)   . Diabetes mellitus without complication ([place])   . Diverticulosis   . DVT (deep venous thrombosis) ([place])   . Family history of breast cancer in first degree relative   . GERD (gastroesophageal reflux disease)   . Headache(784.0)    Hx: of  . History of blood transfusion    "I've had 2; don't know what it was related to" (11/09/2012)  . HLD (hyperlipidemia)   . HTN (hypertension)   . Iron deficiency anemia   . Microcytic anemia   . Peripheral vascular disease ([place])    a. L pop-tibial bypass 2011, followed by [facility].  . Personal history of prostate cancer   . Rotator cuff injury    "left arm; never repaired" (11/09/2012)    Patient Active Problem List   Diagnosis Date Noted  . At risk for sleep apnea 01/10/2019  . Excessive daytime sleepiness 01/10/2019  . Chronic low back pain 09/11/2018  . COPD (chronic obstructive pulmonary disease) ([place])   . Seasonal allergies 07/04/2018  . Chronic obstructive pulmonary disease ([place])  07/04/2018  . Dizziness 01/20/2018  . Malaise 01/20/2018  . Decreased breath sounds 01/20/2018  . Needs flu shot 01/20/2018  . Chronic nonintractable headache 12/07/2017  . Back pain 11/01/2017  . Radicular pain of lower extremity 11/01/2017  . Prostate cancer ([place]) 11/01/2017  . Family history of breast cancer in first degree relative   . Personal history of prostate cancer   . Elliptocytosis on peripheral blood smear ([place]) 09/13/2016  . Prediabetes 03/22/2016  . PVD (peripheral vascular disease) with claudication ([place]) 11/15/2012  . Chest pain, localized 11/10/2012  . Hypokalemia 11/10/2012  . Bradycardia 11/09/2012  . Thrombocytopenia ([place]) 06/01/2012  . Microcytic anemia   . Atherosclerosis of native arteries of the extremities with intermittent claudication 06/16/2011  . Stable angina ([place]) 06/11/2011  . CAD (coronary artery disease)   . Chest pain, atypical 05/30/2011  . Iron deficiency anemia 10/03/2009  . DIVERTICULOSIS OF COLON 10/03/2009  . WEIGHT LOSS, ABNORMAL 10/03/2009  . CHEST PAIN UNSPECIFIED 09/13/2008  . Headache(784.0) 08/19/2008  . TIA 06/05/2008  . SHOULDER PAIN, LEFT 10/11/2007  . COUGH DUE TO ACE INHIBITORS 05/07/2007  . History of tobacco use disorder 04/25/2007  . Hyperlipidemia 04/21/2007  . Essential hypertension 04/21/2007  . GERD 04/21/2007    Past Surgical History:  Procedure Laterality Date  . CARDIOVASCULAR STRESS TEST  Aug. 2014  .  CATARACT EXTRACTION W/ INTRAOCULAR LENS IMPLANT Right ~ 2008  . CIRCUMCISION    . COLONOSCOPY    . CORONARY ANGIOPLASTY WITH STENT PLACEMENT  2014   "1" (11/09/2012)  . ILIAC ARTERY ANEURYSM REPAIR    . LEFT HEART CATH  05-31-12  . LEFT HEART CATHETERIZATION WITH CORONARY ANGIOGRAM N/A 06/01/2011   Procedure: LEFT HEART CATHETERIZATION WITH CORONARY ANGIOGRAM;  Surgeon: Hillary Bow, MD;  Location: [facility] CATH LAB;  Service: Cardiovascular;  Laterality: N/A;  . LEFT HEART CATHETERIZATION WITH CORONARY ANGIOGRAM  N/A 05/31/2012   Procedure: LEFT HEART CATHETERIZATION WITH CORONARY ANGIOGRAM;  Surgeon: Peter M Martinique, MD;  Location: [facility] CATH LAB;  Service: Cardiovascular;  Laterality: N/A;  . LEFT HEART CATHETERIZATION WITH CORONARY ANGIOGRAM N/A 08/01/2013   Procedure: LEFT HEART CATHETERIZATION WITH CORONARY ANGIOGRAM;  Surgeon: Burnell Blanks, MD;  Location: [facility] CATH LAB;  Service: Cardiovascular;  Laterality: N/A;  . PERCUTANEOUS CORONARY STENT INTERVENTION (PCI-S)  05/31/2012   Procedure: PERCUTANEOUS CORONARY STENT INTERVENTION (PCI-S);  Surgeon: Peter M Martinique, MD;  Location: [facility] CATH LAB;  Service: Cardiovascular;;  . PR VEIN BYPASS GRAFT,AORTO-FEM-POP Left 10/01/2009   left below knee popliteal artery to posterior tibial artery  . SHOULDER ARTHROSCOPY WITH OPEN ROTATOR CUFF REPAIR AND DISTAL CLAVICLE ACROMINECTOMY Left 01/12/2013   Procedure: LEFT SHOULDER ARTHROSCOPY WITH DEBRIDEMENT OPEN DISTAL CLAVICLE RESECTION ,acromioplastyAND ROTATOR CUFF REPAIR;  Surgeon: Yvette Rack., MD;  Location: [facility];  Service: Orthopedics;  Laterality: Left;        Home Medications    Prior to Admission medications   Medication Sig Start Date End Date Taking? Authorizing Provider  Accu-Chek FastClix Lancets MISC Test once daily 11/23/18   Henson, Vickie L, NP-C  albuterol (PROVENTIL HFA;VENTOLIN HFA) 108 (90 Base) MCG/ACT inhaler Inhale 2 puffs into the lungs every 6 (six) hours as needed for wheezing or shortness of breath. 07/04/18   Henson, Vickie L, NP-C  amLODipine (NORVASC) 10 MG tablet TAKE 1 TABLET BY MOUTH EVERY DAY 11/09/18   Henson, Vickie L, NP-C  aspirin EC 81 MG tablet Take 81 mg by mouth daily.     [provider]  atorvastatin (LIPITOR) 40 MG tablet Take 40 mg by mouth daily.    [provider]  brimonidine (ALPHAGAN) 0.2 % ophthalmic solution Place 1 drop into the right eye 2 (two) times daily.  09/13/17   [provider]  ferrous sulfate 325 (65 FE) MG tablet Take 325  mg by mouth once a week.     [provider]  gabapentin (NEURONTIN) 600 MG tablet Take 600 mg by mouth 2 (two) times daily.     [provider]  glucose blood test strip Once daily Accu-Chek Guide 11/23/18   Henson, Vickie L, NP-C  isosorbide mononitrate (IMDUR) 30 MG 24 hr tablet Take 30 mg by mouth daily.    [provider]  loratadine (CLARITIN) 10 MG tablet Take 10 mg by mouth daily.    [provider]  losartan-hydrochlorothiazide (HYZAAR) 100-25 MG tablet TAKE 1 TABLET BY MOUTH DAILY 07/24/18   Henson, Vickie L, NP-C  methocarbamol (ROBAXIN) 500 MG tablet Take 1 tablet (500 mg total) by mouth every 8 (eight) hours as needed for muscle spasms. Patient not taking: Reported on 11/23/2018 11/18/18   Davonna Belling, MD  omeprazole (PRILOSEC) 20 MG capsule Take 20 mg by mouth 2 (two) times daily before a meal.    [provider]  predniSONE (DELTASONE) 5 MG tablet Take 5  mg by mouth daily before breakfast.     [provider]  traMADol (ULTRAM) 50 MG tablet TAKE 1 TABLET(50 MG) BY MOUTH EVERY 8 HOURS AS NEEDED FOR SEVERE PAIN Patient taking differently: Take 50 mg by mouth every 8 (eight) hours as needed for severe pain.  09/11/18   Girtha Rm, NP-C    Family History Family History  Problem Relation Age of Onset  . Breast cancer Mother        dx 72s  . Hyperlipidemia Mother   . Hypertension Mother   . COPD Father        deceased  . Hyperlipidemia Father   . Hypertension Father   . Hypertension Sister   . Cancer Brother 27       Unknown cancer, possibly stomach  . Hypertension Brother   . Cancer Brother 82       Unknown, possibly lung  . Hypertension Daughter   . Hypertension Son   . Colon cancer Neg Hx        pt is unclear about family hx    Social History Social History   Tobacco Use  . Smoking status: Former Smoker    Packs/day: 0.50    Years: 52.00    Pack years: 26.00    Types: Cigarettes    Quit date:  05/26/2011    Years since quitting: 7.6  . Smokeless tobacco: Never Used  Substance Use Topics  . Alcohol use: No  . Drug use: No     Allergies   Topiramate, Atenolol, Lisinopril, and Tramadol-acetaminophen   Review of Systems Review of Systems  All other systems reviewed and are negative.    Physical Exam Updated Vital Signs BP (!) 131/53 (BP Location: Right Arm)   Pulse 64   Temp 98.2 F (36.8 C) (Oral)   Resp 18   SpO2 96%   Physical Exam Vitals signs and nursing note reviewed.  Constitutional:      General: He is not in acute distress.    Appearance: Normal appearance. He is well-developed. He is not toxic-appearing.  HENT:     Head: Normocephalic and atraumatic.  Eyes:     General: Lids are normal.     Conjunctiva/sclera: Conjunctivae normal.     Pupils: Pupils are equal, round, and reactive to light.  Neck:     Musculoskeletal: Normal range of motion and neck supple.     Thyroid: No thyroid mass.     Trachea: No tracheal deviation.  Cardiovascular:     Rate and Rhythm: Normal rate and regular rhythm.     Heart sounds: Normal heart sounds. No murmur. No gallop.   Pulmonary:     Effort: Pulmonary effort is normal. No respiratory distress.     Breath sounds: Normal breath sounds. No stridor. No decreased breath sounds, wheezing, rhonchi or rales.  Abdominal:     General: Bowel sounds are normal. There is no distension.     Palpations: Abdomen is soft.     Tenderness: There is no abdominal tenderness. There is no rebound.  Musculoskeletal: Normal range of motion.        General: No tenderness.  Skin:    General: Skin is warm and dry.     Findings: No abrasion or rash.  Neurological:     Mental Status: He is alert and oriented to person, place, and time.     GCS: GCS eye subscore is 4. GCS verbal subscore is 5. GCS motor subscore is 6.  Cranial Nerves: No cranial nerve deficit.     Sensory: No sensory deficit.  Psychiatric:        Speech: Speech  normal.        Behavior: Behavior normal.      ED Treatments / Results  Labs (all labs ordered are listed, but only abnormal results are displayed) Labs Reviewed - No data to display  EKG None  Radiology No results found.  Procedures Procedures (including critical care time)  Medications Ordered in ED Medications  hyoscyamine (LEVSIN SL) SL tablet 0.25 mg (has no administration in time range)     Initial Impression / Assessment and Plan / ED Course  I have reviewed the triage vital signs and the nursing notes.  Pertinent labs & imaging results that were available during my care of the patient were reviewed by me and considered in my medical decision making (see chart for details).        Patient given GI cocktail.  Likely esophagitis.  Will discharge home  Final Clinical Impressions(s) / ED Diagnoses   Final diagnoses:  None    ED Discharge Orders    None       Lacretia Leigh, MD 01/17/19 2035

## 2019-01-18 ENCOUNTER — Other Ambulatory Visit: Payer: Self-pay | Admitting: Internal Medicine

## 2019-01-18 DIAGNOSIS — Z9189 Other specified personal risk factors, not elsewhere classified: Secondary | ICD-10-CM

## 2019-01-18 DIAGNOSIS — G4719 Other hypersomnia: Secondary | ICD-10-CM

## 2019-01-19 DIAGNOSIS — M542 Cervicalgia: Secondary | ICD-10-CM | POA: Diagnosis not present

## 2019-01-19 DIAGNOSIS — M791 Myalgia, unspecified site: Secondary | ICD-10-CM | POA: Diagnosis not present

## 2019-01-19 DIAGNOSIS — R519 Headache, unspecified: Secondary | ICD-10-CM | POA: Diagnosis not present

## 2019-01-19 DIAGNOSIS — G518 Other disorders of facial nerve: Secondary | ICD-10-CM | POA: Diagnosis not present

## 2019-01-26 DIAGNOSIS — Z20828 Contact with and (suspected) exposure to other viral communicable diseases: Secondary | ICD-10-CM | POA: Diagnosis not present

## 2019-01-30 DIAGNOSIS — H11823 Conjunctivochalasis, bilateral: Secondary | ICD-10-CM | POA: Diagnosis not present

## 2019-01-30 DIAGNOSIS — H401230 Low-tension glaucoma, bilateral, stage unspecified: Secondary | ICD-10-CM | POA: Diagnosis not present

## 2019-01-30 DIAGNOSIS — H04123 Dry eye syndrome of bilateral lacrimal glands: Secondary | ICD-10-CM | POA: Diagnosis not present

## 2019-01-30 DIAGNOSIS — H11153 Pinguecula, bilateral: Secondary | ICD-10-CM | POA: Diagnosis not present

## 2019-01-30 DIAGNOSIS — H0102A Squamous blepharitis right eye, upper and lower eyelids: Secondary | ICD-10-CM | POA: Diagnosis not present

## 2019-01-30 DIAGNOSIS — H18413 Arcus senilis, bilateral: Secondary | ICD-10-CM | POA: Diagnosis not present

## 2019-01-30 DIAGNOSIS — H47231 Glaucomatous optic atrophy, right eye: Secondary | ICD-10-CM | POA: Diagnosis not present

## 2019-01-30 DIAGNOSIS — H0102B Squamous blepharitis left eye, upper and lower eyelids: Secondary | ICD-10-CM | POA: Diagnosis not present

## 2019-01-30 DIAGNOSIS — H43811 Vitreous degeneration, right eye: Secondary | ICD-10-CM | POA: Diagnosis not present

## 2019-02-07 ENCOUNTER — Ambulatory Visit (INDEPENDENT_AMBULATORY_CARE_PROVIDER_SITE_OTHER): Payer: Medicare HMO | Admitting: Nurse Practitioner

## 2019-02-07 ENCOUNTER — Telehealth: Payer: Self-pay | Admitting: Internal Medicine

## 2019-02-07 ENCOUNTER — Other Ambulatory Visit (INDEPENDENT_AMBULATORY_CARE_PROVIDER_SITE_OTHER): Payer: Medicare HMO

## 2019-02-07 ENCOUNTER — Other Ambulatory Visit: Payer: Self-pay

## 2019-02-07 VITALS — BP 122/60 | HR 68 | Temp 97.2°F | Ht 69.0 in | Wt 233.4 lb

## 2019-02-07 DIAGNOSIS — D509 Iron deficiency anemia, unspecified: Secondary | ICD-10-CM

## 2019-02-07 DIAGNOSIS — K5909 Other constipation: Secondary | ICD-10-CM | POA: Diagnosis not present

## 2019-02-07 LAB — CBC
HCT: 29.7 % — ABNORMAL LOW (ref 39.0–52.0)
Hemoglobin: 9 g/dL — ABNORMAL LOW (ref 13.0–17.0)
MCHC: 30.5 g/dL (ref 30.0–36.0)
MCV: 66.7 fl — ABNORMAL LOW (ref 78.0–100.0)
Platelets: 222 10*3/uL (ref 150.0–400.0)
RBC: 4.45 Mil/uL (ref 4.22–5.81)
RDW: 18.3 % — ABNORMAL HIGH (ref 11.5–15.5)
WBC: 5.8 10*3/uL (ref 4.0–10.5)

## 2019-02-07 NOTE — Progress Notes (Addendum)
Chief Complaint:  Follow up     IMPRESSION and PLAN:    1. 78 yo male with microcytic anemia without evidence for iron deficiency (even while off iron for a while). He had a drop in hgb which persisted following back surgery but numbers slowing improving back on iron ( 8.3 >> 8.7 >> 9.0). He was FOBT + last month but no overt GI bleeding.  -evaluated at [facility] in March 2019 for iron deficiency anemia. EGD reportedly normal. Colonoscopy  normal except for a 7-8 mm flat polyp and a small ascending colon polyp. Colonoscopy report scanned in Epic but no polyp path available. We sent request for path when I saw him earlier this month.  -At this point will re-request colon path.  -continue oral iron -Repeat CBC today, if hgb not improving then consider EGD and / or VCE   ADDENDUM Received ileocecal valve colon polyp and proximal ascending colon polyp pathology from [place].  Polyps were from colonoscopy done 07/07/2017.  Polyps were tubular adenomas.  Of note.  I never did get the EGD report but a duodenal biopsy is included with: Pathology report.  Duodenal mucosa showed villous blunting and Brunner's gland hyperplasia.  No significant increase in the intraepithelial lymphocytes observed in the duodenal biopsies.  Overall the findings were felt to be nonspecific. Based on new polyp surveillance guidelines patient would be due for polyp surveillance colonoscopy in 7 to 10 years but I will forward this to his primary GI, Dr. Carlean Purl for final decision.   2. Chronic constipation. Takes daily Miralax and stool softener - Add daily Metamucil.      HPI:     Patient is a 78 year old male with pmh significant for prostate cancer, CAD s/p stent in 2014, CKD 2, COPD, DM2 and iron deficiency anemia evaluated by [place] in 2019. I saw him earlier this month for constipation and anemia, he is here for follow up.  Fibercon was expensive so got substitute from fiber capsule.Swallowed the capsule okay but  then afterward had problems swallowing anything else. Seen in ED for this. Felt to have esophagitis, given GI cocktail and sent home. Now taking daily Miralax, stool softener.   Patient's baseline hgb is ~ 10. After back surgery in August, hgb was 9.0 on 01/05/19.  Seen in ED for SOB on 01/10/19 and hgb down further to 8.7 and stools were heme positive. Labs not c/w IDA even though patient has stopped iron supplements several months prior. When I saw him in clinic 01/16/19 he had been back on iron for 3 weeks. Plan was for repeat H+H and to try and to obtain polyp path report from colonoscopy done at [facility] in 2019. EGD at the time was reportedly normal. Repeat Hgb was stable at 8.7.      Review of systems:     No chest pain, no SOB, no fevers, no urinary sx   Past Medical History:  Diagnosis Date  . Atypical chest pain   . CAD (coronary artery disease)    a. Multiple prior evaluations then 05/2012 s/p BMS to OM2. b. Nuc 11/2012 wnl. c. Cath 2015: patent stent, moderate LAD/RCA dz, treated medically.  . Cancer ([place])    Hx: of prostate  . Cataract   . CKD (chronic kidney disease), stage II   . COPD (chronic obstructive pulmonary disease) ([place])   . Cough secondary to angiotensin converting enzyme inhibitor (ACE-I)   . Diabetes mellitus without complication ([place])   .  Diverticulosis   . DVT (deep venous thrombosis) ([place])   . Family history of breast cancer in first degree relative   . GERD (gastroesophageal reflux disease)   . Headache(784.0)    Hx: of  . History of blood transfusion    "I've had 2; don't know what it was related to" (11/09/2012)  . HLD (hyperlipidemia)   . HTN (hypertension)   . Iron deficiency anemia   . Microcytic anemia   . Peripheral vascular disease ([place])    a. L pop-tibial bypass 2011, followed by [facility].  . Personal history of prostate cancer   . Rotator cuff injury    "left arm; never repaired" (11/09/2012)    Patient's surgical history, family medical history, social  history, medications and allergies were all reviewed in Epic     Current Outpatient Medications  Medication Sig Dispense Refill  . Accu-Chek FastClix Lancets MISC Test once daily 102 each 12  . albuterol (PROVENTIL HFA;VENTOLIN HFA) 108 (90 Base) MCG/ACT inhaler Inhale 2 puffs into the lungs every 6 (six) hours as needed for wheezing or shortness of breath. 1 Inhaler 1  . amLODipine (NORVASC) 10 MG tablet TAKE 1 TABLET BY MOUTH EVERY DAY 90 tablet 0  . aspirin EC 81 MG tablet Take 81 mg by mouth daily.     . brimonidine (ALPHAGAN) 0.2 % ophthalmic solution Place 1 drop into the right eye 2 (two) times daily.   0  . ferrous sulfate 325 (65 FE) MG tablet Take 325 mg by mouth once a week.     . gabapentin (NEURONTIN) 600 MG tablet Take 600 mg by mouth 2 (two) times daily.     . isosorbide mononitrate (IMDUR) 30 MG 24 hr tablet Take 30 mg by mouth daily.    Marland Kitchen loratadine (CLARITIN) 10 MG tablet Take 10 mg by mouth daily.    Marland Kitchen losartan-hydrochlorothiazide (HYZAAR) 100-25 MG tablet TAKE 1 TABLET BY MOUTH DAILY 90 tablet 1  . omeprazole (PRILOSEC) 20 MG capsule Take 20 mg by mouth 2 (two) times daily before a meal.    . Polyethylene Glycol 3350 (MIRALAX PO) Take by mouth.    . predniSONE (DELTASONE) 5 MG tablet Take 5 mg by mouth daily before breakfast.     . traMADol (ULTRAM) 50 MG tablet TAKE 1 TABLET(50 MG) BY MOUTH EVERY 8 HOURS AS NEEDED FOR SEVERE PAIN (Patient taking differently: Take 50 mg by mouth every 8 (eight) hours as needed for severe pain. ) 30 tablet 0   No current facility-administered medications for this visit.     Physical Exam:     BP 122/60   Pulse 68   Temp (!) 97.2 F (36.2 C)   Ht 5\' 9"  (1.753 m)   Wt 233 lb 6.4 oz (105.9 kg)   BMI 34.47 kg/m   GENERAL:  Pleasant male in NAD PSYCH: : Cooperative, normal affect EENT:  conjunctiva pink, mucous membranes moist, neck supple without masses CARDIAC:  RRR, soft murmur heard, no peripheral edema PULM: Normal  respiratory effort, lungs CTA bilaterally, no wheezing ABDOMEN:  Nondistended, soft, nontender. No obvious masses, no hepatomegaly,  normal bowel sounds SKIN:  turgor, no lesions seen Musculoskeletal:  Normal muscle tone, normal strength NEURO: Alert and oriented x 3, no focal neurologic deficits   Tye Savoy , NP 02/07/2019, 9:21 AM

## 2019-02-07 NOTE — Telephone Encounter (Signed)
PER [employer]- PT'S HOME SLEEP TEST DOES NOT REQUIRE A PRIOR AUTH  Cpt [id number]   Reference Number [id number]

## 2019-02-07 NOTE — Patient Instructions (Addendum)
If you are age 79 or older, your body mass index should be between 23-30. Your Body mass index is 34.47 kg/m. If this is out of the aforementioned range listed, please consider follow up with your Primary Care Provider.  If you are age 56 or younger, your body mass index should be between 19-25. Your Body mass index is 34.47 kg/m. If this is out of the aformentioned range listed, please consider follow up with your Primary Care Provider.   Your provider has requested that you go to the basement level for lab work before leaving today. Press "B" on the elevator. The lab is located at the first door on the left as you exit the elevator.  Continue Iron.  Start Metamucil daily. (this is over-the-counter)  Continue Miralax daily.  Stool Softener.  We are requesting path report from [facility] hospital.  Thank you for choosing me and [facility].   Tye Savoy, NP

## 2019-02-09 ENCOUNTER — Other Ambulatory Visit: Payer: Self-pay | Admitting: *Deleted

## 2019-02-09 ENCOUNTER — Encounter: Payer: Self-pay | Admitting: Nurse Practitioner

## 2019-02-09 DIAGNOSIS — R519 Headache, unspecified: Secondary | ICD-10-CM | POA: Diagnosis not present

## 2019-02-09 DIAGNOSIS — D509 Iron deficiency anemia, unspecified: Secondary | ICD-10-CM

## 2019-02-09 DIAGNOSIS — G518 Other disorders of facial nerve: Secondary | ICD-10-CM | POA: Diagnosis not present

## 2019-02-09 DIAGNOSIS — M791 Myalgia, unspecified site: Secondary | ICD-10-CM | POA: Diagnosis not present

## 2019-02-09 DIAGNOSIS — M542 Cervicalgia: Secondary | ICD-10-CM | POA: Diagnosis not present

## 2019-02-23 ENCOUNTER — Telehealth: Payer: Self-pay

## 2019-02-23 NOTE — Telephone Encounter (Signed)
Called patient to remind him it is time to have the hgb and hct rechecked.

## 2019-02-26 ENCOUNTER — Other Ambulatory Visit: Payer: Self-pay | Admitting: Family Medicine

## 2019-02-27 ENCOUNTER — Emergency Department (HOSPITAL_COMMUNITY)
Admission: EM | Admit: 2019-02-27 | Discharge: 2019-02-27 | Disposition: A | Discharge status: Home / Self Care | Payer: Medicare HMO | Attending: Emergency Medicine | Admitting: Emergency Medicine

## 2019-02-27 ENCOUNTER — Emergency Department (HOSPITAL_COMMUNITY): Payer: Medicare HMO

## 2019-02-27 ENCOUNTER — Other Ambulatory Visit (INDEPENDENT_AMBULATORY_CARE_PROVIDER_SITE_OTHER): Payer: Medicare HMO

## 2019-02-27 DIAGNOSIS — I129 Hypertensive chronic kidney disease with stage 1 through stage 4 chronic kidney disease, or unspecified chronic kidney disease: Secondary | ICD-10-CM | POA: Insufficient documentation

## 2019-02-27 DIAGNOSIS — Z87891 Personal history of nicotine dependence: Secondary | ICD-10-CM | POA: Diagnosis not present

## 2019-02-27 DIAGNOSIS — M542 Cervicalgia: Secondary | ICD-10-CM | POA: Insufficient documentation

## 2019-02-27 DIAGNOSIS — Z79899 Other long term (current) drug therapy: Secondary | ICD-10-CM | POA: Diagnosis not present

## 2019-02-27 DIAGNOSIS — R519 Headache, unspecified: Secondary | ICD-10-CM | POA: Diagnosis not present

## 2019-02-27 DIAGNOSIS — J449 Chronic obstructive pulmonary disease, unspecified: Secondary | ICD-10-CM | POA: Diagnosis not present

## 2019-02-27 DIAGNOSIS — D509 Iron deficiency anemia, unspecified: Secondary | ICD-10-CM | POA: Diagnosis not present

## 2019-02-27 DIAGNOSIS — E1122 Type 2 diabetes mellitus with diabetic chronic kidney disease: Secondary | ICD-10-CM | POA: Diagnosis not present

## 2019-02-27 DIAGNOSIS — Z7982 Long term (current) use of aspirin: Secondary | ICD-10-CM | POA: Diagnosis not present

## 2019-02-27 DIAGNOSIS — I2510 Atherosclerotic heart disease of native coronary artery without angina pectoris: Secondary | ICD-10-CM | POA: Insufficient documentation

## 2019-02-27 DIAGNOSIS — N182 Chronic kidney disease, stage 2 (mild): Secondary | ICD-10-CM | POA: Diagnosis not present

## 2019-02-27 LAB — PROTIME-INR
INR: 1 (ref 0.8–1.2)
Prothrombin Time: 13 seconds (ref 11.4–15.2)

## 2019-02-27 LAB — DIFFERENTIAL
Abs Immature Granulocytes: 0 10*3/uL (ref 0.00–0.07)
Basophils Absolute: 0.1 10*3/uL (ref 0.0–0.1)
Basophils Relative: 1 %
Eosinophils Absolute: 0.2 10*3/uL (ref 0.0–0.5)
Eosinophils Relative: 3 %
Lymphocytes Relative: 22 %
Lymphs Abs: 1.2 10*3/uL (ref 0.7–4.0)
Monocytes Absolute: 0.2 10*3/uL (ref 0.1–1.0)
Monocytes Relative: 3 %
Neutro Abs: 3.9 10*3/uL (ref 1.7–7.7)
Neutrophils Relative %: 71 %
nRBC: 0 /100 WBC

## 2019-02-27 LAB — CBC
HCT: 30.4 % — ABNORMAL LOW (ref 39.0–52.0)
Hemoglobin: 9.1 g/dL — ABNORMAL LOW (ref 13.0–17.0)
MCH: 20.9 pg — ABNORMAL LOW (ref 26.0–34.0)
MCHC: 29.9 g/dL — ABNORMAL LOW (ref 30.0–36.0)
MCV: 69.7 fL — ABNORMAL LOW (ref 80.0–100.0)
Platelets: 209 10*3/uL (ref 150–400)
RBC: 4.36 MIL/uL (ref 4.22–5.81)
RDW: 17.9 % — ABNORMAL HIGH (ref 11.5–15.5)
WBC: 5.5 10*3/uL (ref 4.0–10.5)
nRBC: 0 % (ref 0.0–0.2)

## 2019-02-27 LAB — COMPREHENSIVE METABOLIC PANEL
ALT: 13 U/L (ref 0–44)
AST: 13 U/L — ABNORMAL LOW (ref 15–41)
Albumin: 3.6 g/dL (ref 3.5–5.0)
Alkaline Phosphatase: 84 U/L (ref 38–126)
Anion gap: 12 (ref 5–15)
BUN: 13 mg/dL (ref 8–23)
CO2: 23 mmol/L (ref 22–32)
Calcium: 9 mg/dL (ref 8.9–10.3)
Chloride: 99 mmol/L (ref 98–111)
Creatinine, Ser: 1.12 mg/dL (ref 0.61–1.24)
GFR calc Af Amer: >60 mL/min (ref >60)
GFR calc non Af Amer: >60 mL/min (ref >60)
Glucose, Bld: 127 mg/dL — ABNORMAL HIGH (ref 70–99)
Potassium: 3.6 mmol/L (ref 3.5–5.1)
Sodium: 134 mmol/L — ABNORMAL LOW (ref 135–145)
Total Bilirubin: 0.4 mg/dL (ref 0.3–1.2)
Total Protein: 6.8 g/dL (ref 6.5–8.1)

## 2019-02-27 LAB — I-STAT CHEM 8, ED
BUN: 14 mg/dL (ref 8–23)
Calcium, Ion: 1.21 mmol/L (ref 1.15–1.40)
Chloride: 101 mmol/L (ref 98–111)
Creatinine, Ser: 1.1 mg/dL (ref 0.61–1.24)
Glucose, Bld: 123 mg/dL — ABNORMAL HIGH (ref 70–99)
HCT: 30 % — ABNORMAL LOW (ref 39.0–52.0)
Hemoglobin: 10.2 g/dL — ABNORMAL LOW (ref 13.0–17.0)
Potassium: 3.7 mmol/L (ref 3.5–5.1)
Sodium: 138 mmol/L (ref 135–145)
TCO2: 26 mmol/L (ref 22–32)

## 2019-02-27 LAB — CBG MONITORING, ED: Glucose-Capillary: 92 mg/dL (ref 70–99)

## 2019-02-27 LAB — APTT: aPTT: 32 seconds (ref 24–36)

## 2019-02-27 LAB — HEMATOCRIT: HCT: 29.3 % — ABNORMAL LOW (ref 39.0–52.0)

## 2019-02-27 LAB — HEMOGLOBIN: Hemoglobin: 9.1 g/dL — ABNORMAL LOW (ref 13.0–17.0)

## 2019-02-27 MED ORDER — SODIUM CHLORIDE 0.9% FLUSH
3.0000 mL | Freq: Once | INTRAVENOUS | Status: DC
Start: 2019-02-27 — End: 2019-02-27

## 2019-02-27 MED ORDER — METOCLOPRAMIDE HCL 5 MG/ML IJ SOLN
10.0000 mg | Freq: Once | INTRAMUSCULAR | Status: AC
  Administered 2019-02-27: 10 mg via INTRAMUSCULAR
  Filled 2019-02-27: qty 2

## 2019-02-27 MED ORDER — DIPHENHYDRAMINE HCL 50 MG/ML IJ SOLN
12.5000 mg | Freq: Once | INTRAMUSCULAR | Status: AC
  Administered 2019-02-27: 12.5 mg via INTRAMUSCULAR
  Filled 2019-02-27: qty 1

## 2019-02-27 MED ORDER — ACETAMINOPHEN 325 MG PO TABS
650.0000 mg | ORAL_TABLET | Freq: Once | ORAL | Status: AC
  Administered 2019-02-27: 650 mg via ORAL
  Filled 2019-02-27: qty 2

## 2019-02-27 NOTE — Discharge Instructions (Signed)
Please read and follow all provided instructions.  Your diagnoses today include:  1. Neck pain   2. Acute nonintractable headache, unspecified headache type     Tests performed today include:  CT of your head which was normal and did not show any serious cause of your headache  Blood counts and electrolytes  Vital signs. See below for your results today.   Medications:  In the Emergency Department you received:  Reglan - antinausea/headache medication  Benadryl - antihistamine to counteract potential side effects of reglan  Take any prescribed medications only as directed.  Additional information:  Follow any educational materials contained in this packet.  You are having a headache. No specific cause was found today for your headache. It may have been a migraine or other cause of headache. Stress, anxiety, fatigue, and depression are common triggers for headaches.   Your headache today does not appear to be life-threatening or require hospitalization, but often the exact cause of headaches is not determined in the emergency department. Therefore, follow-up with your doctor is very important to find out what may have caused your headache and whether or not you need any further diagnostic testing or treatment.   Sometimes headaches can appear benign (not harmful), but then more serious symptoms can develop which should prompt an immediate re-evaluation by your doctor or the emergency department.  BE VERY CAREFUL not to take multiple medicines containing Tylenol (also called acetaminophen). Doing so can lead to an overdose which can damage your liver and cause liver failure and possibly death.   Follow-up instructions: Please follow-up with your primary care provider in the next 3 days for further evaluation of your symptoms.   Return instructions:   Please return to the Emergency Department if you experience worsening symptoms.  Return if the medications do not resolve your  headache, if it recurs, or if you have multiple episodes of vomiting or cannot keep down fluids.  Return if you have a change from the usual headache.  RETURN IMMEDIATELY IF you:  Develop a sudden, severe headache  Develop confusion or become poorly responsive or faint  Develop a fever above 100.49F or problem breathing  Have a change in speech, vision, swallowing, or understanding  Develop new weakness, numbness, tingling, incoordination in your arms or legs  Have a seizure  Please return if you have any other emergent concerns.  Additional Information:  Your vital signs today were: BP 109/65    Pulse 64    Temp (!) 97.5 F (36.4 C) (Oral)    Resp 14    SpO2 95%  If your blood pressure (BP) was elevated above 135/85 this visit, please have this repeated by your doctor within one month. --------------

## 2019-02-27 NOTE — ED Notes (Signed)
CBG Results of 92 reported to Door, Therapist, sports.

## 2019-02-27 NOTE — ED Notes (Signed)
Patient verbalizes understanding of discharge instructions. Opportunity for questioning and answers were provided. Armband removed by staff, pt discharged from ED. Pt. ambulatory and discharged home.  

## 2019-02-27 NOTE — ED Provider Notes (Signed)
[facility] EMERGENCY DEPARTMENT Provider Note   CSN: [id number] Arrival date & time: 02/27/19  1426     History   Chief Complaint Chief Complaint  Patient presents with  . Headache  . Numbness    HPI Joel [name] is a 78 y.o. male.     Patient with history of chronic headaches for which he takes gabapentin, old infarcts noted on MRI in 08/2018, MCA aneurysms noted on MRA in 2017, chronic kidney disease, COPD, prostate cancer, coronary artery disease, peripheral vascular disease, previous lower back surgery --presents to the emergency department with complaints of neck pain which is now resolved and a persistent headache.  Patient states that around 11:00 this morning he developed 2 short-lived episodes of severe sharp stabbing pain in the right side of his neck.  This was associated with twisting his neck.  With this he did not have any vision changes or vision loss, slurred speech, trouble talking or walking, weakness in the arms of the legs.  After the neck pain resolved he developed a generalized headache which has slowly been improving.     Past Medical History:  Diagnosis Date  . Atypical chest pain   . CAD (coronary artery disease)    a. Multiple prior evaluations then 05/2012 s/p BMS to OM2. b. Nuc 11/2012 wnl. c. Cath 2015: patent stent, moderate LAD/RCA dz, treated medically.  . Cancer ([place])    Hx: of prostate  . Cataract   . CKD (chronic kidney disease), stage II   . COPD (chronic obstructive pulmonary disease) ([place])   . Cough secondary to angiotensin converting enzyme inhibitor (ACE-I)   . Diabetes mellitus without complication ([place])   . Diverticulosis   . DVT (deep venous thrombosis) ([place])   . Family history of breast cancer in first degree relative   . GERD (gastroesophageal reflux disease)   . Headache(784.0)    Hx: of  . History of blood transfusion    "I've had 2; don't know what it was related to" (11/09/2012)  . HLD (hyperlipidemia)    . HTN (hypertension)   . Iron deficiency anemia   . Microcytic anemia   . Peripheral vascular disease ([place])    a. L pop-tibial bypass 2011, followed by [facility].  . Personal history of prostate cancer   . Rotator cuff injury    "left arm; never repaired" (11/09/2012)    Patient Active Problem List   Diagnosis Date Noted  . At risk for sleep apnea 01/10/2019  . Excessive daytime sleepiness 01/10/2019  . Chronic low back pain 09/11/2018  . COPD (chronic obstructive pulmonary disease) ([place])   . Seasonal allergies 07/04/2018  . Chronic obstructive pulmonary disease ([place]) 07/04/2018  . Dizziness 01/20/2018  . Malaise 01/20/2018  . Decreased breath sounds 01/20/2018  . Needs flu shot 01/20/2018  . Chronic nonintractable headache 12/07/2017  . Back pain 11/01/2017  . Radicular pain of lower extremity 11/01/2017  . Prostate cancer ([place]) 11/01/2017  . Family history of breast cancer in first degree relative   . Personal history of prostate cancer   . Elliptocytosis on peripheral blood smear ([place]) 09/13/2016  . Prediabetes 03/22/2016  . PVD (peripheral vascular disease) with claudication ([place]) 11/15/2012  . Chest pain, localized 11/10/2012  . Hypokalemia 11/10/2012  . Bradycardia 11/09/2012  . Thrombocytopenia ([place]) 06/01/2012  . Microcytic anemia   . Atherosclerosis of native arteries of the extremities with intermittent claudication 06/16/2011  . Stable angina ([place]) 06/11/2011  . CAD (coronary artery  disease)   . Chest pain, atypical 05/30/2011  . Iron deficiency anemia 10/03/2009  . DIVERTICULOSIS OF COLON 10/03/2009  . WEIGHT LOSS, ABNORMAL 10/03/2009  . CHEST PAIN UNSPECIFIED 09/13/2008  . Headache(784.0) 08/19/2008  . TIA 06/05/2008  . SHOULDER PAIN, LEFT 10/11/2007  . COUGH DUE TO ACE INHIBITORS 05/07/2007  . History of tobacco use disorder 04/25/2007  . Hyperlipidemia 04/21/2007  . Essential hypertension 04/21/2007  . GERD 04/21/2007    Past Surgical History:   Procedure Laterality Date  . CARDIOVASCULAR STRESS TEST  Aug. 2014  . CATARACT EXTRACTION W/ INTRAOCULAR LENS IMPLANT Right ~ 2008  . CIRCUMCISION    . COLONOSCOPY    . CORONARY ANGIOPLASTY WITH STENT PLACEMENT  2014   "1" (11/09/2012)  . ILIAC ARTERY ANEURYSM REPAIR    . LEFT HEART CATH  05-31-12  . LEFT HEART CATHETERIZATION WITH CORONARY ANGIOGRAM N/A 06/01/2011   Procedure: LEFT HEART CATHETERIZATION WITH CORONARY ANGIOGRAM;  Surgeon: Hillary Bow, MD;  Location: [facility] CATH LAB;  Service: Cardiovascular;  Laterality: N/A;  . LEFT HEART CATHETERIZATION WITH CORONARY ANGIOGRAM N/A 05/31/2012   Procedure: LEFT HEART CATHETERIZATION WITH CORONARY ANGIOGRAM;  Surgeon: Peter M Martinique, MD;  Location: [facility] CATH LAB;  Service: Cardiovascular;  Laterality: N/A;  . LEFT HEART CATHETERIZATION WITH CORONARY ANGIOGRAM N/A 08/01/2013   Procedure: LEFT HEART CATHETERIZATION WITH CORONARY ANGIOGRAM;  Surgeon: Burnell Blanks, MD;  Location: [facility] CATH LAB;  Service: Cardiovascular;  Laterality: N/A;  . PERCUTANEOUS CORONARY STENT INTERVENTION (PCI-S)  05/31/2012   Procedure: PERCUTANEOUS CORONARY STENT INTERVENTION (PCI-S);  Surgeon: Peter M Martinique, MD;  Location: [facility] CATH LAB;  Service: Cardiovascular;;  . PR VEIN BYPASS GRAFT,AORTO-FEM-POP Left 10/01/2009   left below knee popliteal artery to posterior tibial artery  . SHOULDER ARTHROSCOPY WITH OPEN ROTATOR CUFF REPAIR AND DISTAL CLAVICLE ACROMINECTOMY Left 01/12/2013   Procedure: LEFT SHOULDER ARTHROSCOPY WITH DEBRIDEMENT OPEN DISTAL CLAVICLE RESECTION ,acromioplastyAND ROTATOR CUFF REPAIR;  Surgeon: Yvette Rack., MD;  Location: [facility];  Service: Orthopedics;  Laterality: Left;        Home Medications    Prior to Admission medications   Medication Sig Start Date End Date Taking? Authorizing Provider  abiraterone acetate (ZYTIGA) 250 MG tablet Take 1,000 mg by mouth 4 (four) times daily. Take on an empty stomach 1 hour before or 2 hours after a meal     [provider]  Accu-Chek FastClix Lancets MISC Test once daily 11/23/18   Henson, Vickie L, NP-C  albuterol (PROVENTIL HFA;VENTOLIN HFA) 108 (90 Base) MCG/ACT inhaler Inhale 2 puffs into the lungs every 6 (six) hours as needed for wheezing or shortness of breath. 07/04/18   Henson, Vickie L, NP-C  amLODipine (NORVASC) 10 MG tablet TAKE 1 TABLET BY MOUTH EVERY DAY 02/26/19   Henson, Vickie L, NP-C  aspirin EC 81 MG tablet Take 81 mg by mouth daily.     [provider]  brimonidine (ALPHAGAN) 0.2 % ophthalmic solution Place 1 drop into the right eye 2 (two) times daily.  09/13/17   [provider]  ferrous sulfate 325 (65 FE) MG tablet Take 325 mg by mouth once a week.     [provider]  gabapentin (NEURONTIN) 600 MG tablet Take 600 mg by mouth 2 (two) times daily.     [provider]  isosorbide mononitrate (IMDUR) 30 MG 24 hr tablet Take 30 mg by mouth daily.    [provider]  loratadine (CLARITIN) 10 MG tablet Take  10 mg by mouth daily.    [provider]  losartan-hydrochlorothiazide (HYZAAR) 100-25 MG tablet TAKE 1 TABLET BY MOUTH DAILY 02/26/19   Henson, Vickie L, NP-C  omeprazole (PRILOSEC) 20 MG capsule Take 20 mg by mouth 2 (two) times daily before a meal.    [provider]  Polyethylene Glycol 3350 (MIRALAX PO) Take by mouth.    [provider]  predniSONE (DELTASONE) 5 MG tablet Take 5 mg by mouth daily before breakfast.     [provider]  traMADol (ULTRAM) 50 MG tablet TAKE 1 TABLET(50 MG) BY MOUTH EVERY 8 HOURS AS NEEDED FOR SEVERE PAIN Patient taking differently: Take 50 mg by mouth every 8 (eight) hours as needed for severe pain.  09/11/18   Girtha Rm, NP-C    Family History Family History  Problem Relation Age of Onset  . Breast cancer Mother        dx 11s  . Hyperlipidemia Mother   . Hypertension Mother   . COPD Father        deceased  . Hyperlipidemia Father   .  Hypertension Father   . Hypertension Sister   . Cancer Brother 75       Unknown cancer, possibly stomach  . Hypertension Brother   . Cancer Brother 70       Unknown, possibly lung  . Hypertension Daughter   . Hypertension Son   . Colon cancer Neg Hx        pt is unclear about family hx    Social History Social History   Tobacco Use  . Smoking status: Former Smoker    Packs/day: 0.50    Years: 52.00    Pack years: 26.00    Types: Cigarettes    Quit date: 05/26/2011    Years since quitting: 7.7  . Smokeless tobacco: Never Used  Substance Use Topics  . Alcohol use: No  . Drug use: No     Allergies   Topiramate, Atenolol, Lisinopril, and Tramadol-acetaminophen   Review of Systems Review of Systems  Constitutional: Negative for fever.  HENT: Negative for congestion, dental problem, rhinorrhea and sinus pressure.   Eyes: Negative for photophobia, discharge, redness and visual disturbance.  Respiratory: Negative for shortness of breath.   Cardiovascular: Negative for chest pain.  Gastrointestinal: Negative for nausea and vomiting.  Musculoskeletal: Positive for neck pain. Negative for gait problem and neck stiffness.  Skin: Negative for rash.  Neurological: Positive for headaches. Negative for syncope, speech difficulty, weakness, light-headedness and numbness.  Psychiatric/Behavioral: Negative for confusion.     Physical Exam Updated Vital Signs BP 124/74   Pulse 63   Temp (!) 97.5 F (36.4 C) (Oral)   Resp 13   SpO2 99%   Physical Exam Vitals signs and nursing note reviewed.  Constitutional:      Appearance: He is well-developed.  HENT:     Head: Normocephalic and atraumatic.     Right Ear: Tympanic membrane, ear canal and external ear normal.     Left Ear: Tympanic membrane, ear canal and external ear normal.     Nose: Nose normal.     Mouth/Throat:     Pharynx: Uvula midline.  Eyes:     General: Lids are normal.     Conjunctiva/sclera: Conjunctivae  normal.     Pupils: Pupils are equal, round, and reactive to light.  Neck:     Musculoskeletal: Normal range of motion and neck supple.  Cardiovascular:  Rate and Rhythm: Normal rate and regular rhythm.  Pulmonary:     Effort: Pulmonary effort is normal.     Breath sounds: Normal breath sounds.  Abdominal:     Palpations: Abdomen is soft.     Tenderness: There is no abdominal tenderness.  Musculoskeletal: Normal range of motion.     Cervical back: He exhibits normal range of motion, no tenderness and no bony tenderness.     Comments: Healed surgical scars over the L-spine.  Skin:    General: Skin is warm and dry.  Neurological:     Mental Status: He is alert and oriented to person, place, and time.     GCS: GCS eye subscore is 4. GCS verbal subscore is 5. GCS motor subscore is 6.     Cranial Nerves: No cranial nerve deficit.     Sensory: No sensory deficit.     Motor: No abnormal muscle tone.     Coordination: Coordination normal.     Gait: Gait normal.     Deep Tendon Reflexes: Reflexes are normal and symmetric.      ED Treatments / Results  Labs (all labs ordered are listed, but only abnormal results are displayed) Labs Reviewed  CBC - Abnormal; Notable for the following components:      Result Value   Hemoglobin 9.1 (*)    HCT 30.4 (*)    MCV 69.7 (*)    MCH 20.9 (*)    MCHC 29.9 (*)    RDW 17.9 (*)    All other components within normal limits  COMPREHENSIVE METABOLIC PANEL - Abnormal; Notable for the following components:   Sodium 134 (*)    Glucose, Bld 127 (*)    AST 13 (*)    All other components within normal limits  I-STAT CHEM 8, ED - Abnormal; Notable for the following components:   Glucose, Bld 123 (*)    Hemoglobin 10.2 (*)    HCT 30.0 (*)    All other components within normal limits  PROTIME-INR  APTT  DIFFERENTIAL  CBG MONITORING, ED    EKG None  Radiology Ct Head Wo Contrast  Result Date: 02/27/2019 CLINICAL DATA:  headache, severe  EXAM: CT HEAD WITHOUT CONTRAST TECHNIQUE: Contiguous axial images were obtained from the base of the skull through the vertex without intravenous contrast. COMPARISON:  May 18,2020 FINDINGS: Brain: No evidence of acute territorial infarction, hemorrhage, hydrocephalus,extra-axial collection or mass lesion/mass effect. There is dilatation the ventricles and sulci consistent with age-related atrophy. Low-attenuation changes in the deep white matter consistent with small vessel ischemia. Vascular: No hyperdense vessel or unexpected calcification. Skull: The skull is intact. No fracture or focal lesion identified. Sinuses/Orbits: Mild ethmoid air cell mucosal thickening. The orbits and globes intact. Other: None IMPRESSION: No acute intracranial abnormality. Findings consistent with age related atrophy and chronic small vessel ischemia Electronically Signed   By: Prudencio Pair M.D.   On: 02/27/2019 15:31    Procedures Procedures (including critical care time)  Medications Ordered in ED Medications  sodium chloride flush (NS) 0.9 % injection 3 mL (has no administration in time range)  acetaminophen (TYLENOL) tablet 650 mg (650 mg Oral Given 02/27/19 1957)  metoCLOPramide (REGLAN) injection 10 mg (10 mg Intramuscular Given 02/27/19 2023)  diphenhydrAMINE (BENADRYL) injection 12.5 mg (12.5 mg Intramuscular Given 02/27/19 2023)     Initial Impression / Assessment and Plan / ED Course  I have reviewed the triage vital signs and the nursing notes.  Pertinent labs &  imaging results that were available during my care of the patient were reviewed by me and considered in my medical decision making (see chart for details).        Patient seen and examined. Work-up reviewed. Discussed with Dr. Darl Householder who will see.    Vital signs reviewed and are as follows: BP 124/74   Pulse 63   Temp (!) 97.5 F (36.4 C) (Oral)   Resp 13   SpO2 99%   8:40 PM patient discussed with and seen by Dr. Darl Householder who ordered the  patient IM Reglan and Benadryl which he has gotten.  Patient is improved.  Requesting to get dressed and to go home.  We will discharged home at this time.  Encouraged close follow-up with his doctor to ensure improvement in symptoms.  Patient counseled to return if they have weakness in their arms or legs, slurred speech, trouble walking or talking, confusion, trouble with their balance, or if they have any other concerns. Patient verbalizes understanding and agrees with plan.     Final Clinical Impressions(s) / ED Diagnoses   Final diagnoses:  Neck pain  Acute nonintractable headache, unspecified headache type   Patient with episodes of right-sided neck pain earlier today followed by headache.  He is someone that gets frequent headaches and has follow-up with a headache specialist.  He is on gabapentin for these.  He does have a history of CVA aneurysms which were just being monitored.  Patient does not have any meningismus at the current time.  He has had no confusion or strokelike symptoms.  Symptoms are improved with treatment here.  Low concern for sentinel bleeding, meningitis, vertebral or carotid artery dissection.  Patient counseled on signs and symptoms to return.  Encourage close follow-up with his doctors.  ED Discharge Orders    None       Carlisle Cater, Hershal Coria 02/27/19 2042    Drenda Freeze, MD 02/27/19 2055

## 2019-03-01 DIAGNOSIS — M4716 Other spondylosis with myelopathy, lumbar region: Secondary | ICD-10-CM | POA: Diagnosis not present

## 2019-03-02 DIAGNOSIS — M542 Cervicalgia: Secondary | ICD-10-CM | POA: Diagnosis not present

## 2019-03-02 DIAGNOSIS — R519 Headache, unspecified: Secondary | ICD-10-CM | POA: Diagnosis not present

## 2019-03-02 DIAGNOSIS — M791 Myalgia, unspecified site: Secondary | ICD-10-CM | POA: Diagnosis not present

## 2019-03-02 DIAGNOSIS — G518 Other disorders of facial nerve: Secondary | ICD-10-CM | POA: Diagnosis not present

## 2019-03-14 ENCOUNTER — Telehealth: Payer: Self-pay | Admitting: Family Medicine

## 2019-03-14 NOTE — Telephone Encounter (Signed)
Pt needs Tramadol from [facility] on [address]

## 2019-03-14 NOTE — Telephone Encounter (Signed)
Pt.has been scheduled for tomorrow for a virtual apt. To discuss tramadol and why and how he is taking it.

## 2019-03-14 NOTE — Telephone Encounter (Signed)
See if we can we do a virtual visit to discuss what he is currently taking Tramadol for and how often?

## 2019-03-15 ENCOUNTER — Other Ambulatory Visit: Payer: Self-pay

## 2019-03-15 ENCOUNTER — Ambulatory Visit (INDEPENDENT_AMBULATORY_CARE_PROVIDER_SITE_OTHER): Payer: Medicare HMO | Admitting: Family Medicine

## 2019-03-15 ENCOUNTER — Other Ambulatory Visit: Payer: Self-pay | Admitting: Family Medicine

## 2019-03-15 ENCOUNTER — Encounter: Payer: Self-pay | Admitting: Family Medicine

## 2019-03-15 VITALS — Wt 243.0 lb

## 2019-03-15 DIAGNOSIS — J449 Chronic obstructive pulmonary disease, unspecified: Secondary | ICD-10-CM

## 2019-03-15 DIAGNOSIS — Z79899 Other long term (current) drug therapy: Secondary | ICD-10-CM | POA: Diagnosis not present

## 2019-03-15 DIAGNOSIS — M545 Low back pain, unspecified: Secondary | ICD-10-CM

## 2019-03-15 DIAGNOSIS — G8929 Other chronic pain: Secondary | ICD-10-CM

## 2019-03-15 MED ORDER — TRAMADOL HCL 50 MG PO TABS: ORAL_TABLET | ORAL | 0 refills | Status: DC

## 2019-03-15 MED ORDER — ALBUTEROL SULFATE HFA 108 (90 BASE) MCG/ACT IN AERS: 2.0000 | INHALATION_SPRAY | Freq: Four times a day (QID) | RESPIRATORY_TRACT | 0 refills | Status: DC | PRN

## 2019-03-15 NOTE — Telephone Encounter (Signed)
Needs 90 day supply on this. Will refill. Ok er vickie

## 2019-03-15 NOTE — Telephone Encounter (Signed)
I sent in a refill already. Decline that one please

## 2019-03-15 NOTE — Telephone Encounter (Signed)
Is this ok to refill?  

## 2019-03-15 NOTE — Progress Notes (Signed)
   Subjective:  Documentation for virtual audio and video telecommunications through [place] encounter:  The patient was located at home. 2 patient identifiers used.  The provider was located in the office. The patient did consent to this visit and is aware of possible charges through their insurance for this visit.  The other persons participating in this telemedicine service were none.    [name], male    DOB: [birth year], 78 y.o.   MRN: [id number]  HPI Chief Complaint  Patient presents with  . follow-up    follow-up on why taking tramdal    Requests refill of Tramadol for pain. Last prescription for this from me was in June 2020.  States he has been taking it for various aches and pains.   He also is requesting a refill of albuterol. Dose not need this often. History of COPD and no recent flare ups.   Denies fever, chills, dizziness, chest pain, palpitations, shortness of breath, abdominal pain, N/V/D, urinary symptoms, LE edema.     Review of Systems Pertinent positives and negatives in the history of present illness.     Objective:   Physical Exam Wt 243 lb (110.2 kg)   BMI 35.88 kg/m   Alert and oriented and in no acute distress. Normal speech, mood and thought process.       Assessment & Plan:  Chronic low back pain, unspecified back pain laterality, unspecified whether sciatica present - Plan: traMADol (ULTRAM) 50 MG tablet  Chronic obstructive pulmonary disease, unspecified COPD type ([place]) - Plan: DISCONTINUED: albuterol (VENTOLIN HFA) 108 (90 Base) MCG/ACT inhaler  Medication management - Plan: DISCONTINUED: albuterol (VENTOLIN HFA) 108 (90 Base) MCG/ACT inhaler  He appears to be in good spirits and in his usual state of health. Has been taking Tramadol recently for chronic aches and pain. He does not abuse this and has been taking it prn for years. Reviewed controlled substance reporting system and no aberrancies.  COPD controlled.  Refill albuterol per request. He will let me know if he is needing this more often.   Time spent on call was 12 minutes and in review of previous records 15 minutes total.  This virtual service is not related to other E/M service within previous 7 days.

## 2019-03-16 ENCOUNTER — Telehealth: Payer: Self-pay | Admitting: Nurse Practitioner

## 2019-03-16 NOTE — Telephone Encounter (Signed)
Spoke with the patient. He normally has constipation which he treats and controls with Miralax. Past week he has had increase of bowel movements and a sense of urgency and tenesmus. Denies recent antibiotics, nausea or fever. No abdominal pain. Held Miralax today. 3 light brown soft unformed stools today. Advised to hold the Miralax until he has formed bowel movements, no dairy, no fried or high fat foods, no spicy foods. Do you want any labs or further recommendations?

## 2019-03-19 NOTE — Telephone Encounter (Signed)
I saw him for constipation.  If he was having loose stool because of interventions we made for the constipation then yes I agree holding the MiraLAX.  He could certainly reduce the dose by one half every day or even just take every other day.  If continues to have loose stool then I need to see him in the office.  Also let him know that we did receive records from GI work-up last year, we will let him know if there is anything else that needs to be done regarding anemia.  Thanks

## 2019-03-19 NOTE — Telephone Encounter (Signed)
Patient is advised. States he is presently using Metamucil. Stools are more comfortable to him now.

## 2019-03-20 ENCOUNTER — Encounter: Payer: Self-pay | Admitting: Family Medicine

## 2019-03-23 DIAGNOSIS — M791 Myalgia, unspecified site: Secondary | ICD-10-CM | POA: Diagnosis not present

## 2019-03-23 DIAGNOSIS — R519 Headache, unspecified: Secondary | ICD-10-CM | POA: Diagnosis not present

## 2019-03-23 DIAGNOSIS — M542 Cervicalgia: Secondary | ICD-10-CM | POA: Diagnosis not present

## 2019-03-23 DIAGNOSIS — G518 Other disorders of facial nerve: Secondary | ICD-10-CM | POA: Diagnosis not present

## 2019-03-26 ENCOUNTER — Encounter: Payer: Self-pay | Admitting: Internal Medicine

## 2019-03-29 ENCOUNTER — Other Ambulatory Visit: Payer: Self-pay

## 2019-03-29 ENCOUNTER — Ambulatory Visit (HOSPITAL_BASED_OUTPATIENT_CLINIC_OR_DEPARTMENT_OTHER): Payer: Medicare HMO | Attending: Family Medicine | Admitting: Internal Medicine

## 2019-03-29 DIAGNOSIS — G4719 Other hypersomnia: Secondary | ICD-10-CM

## 2019-03-29 DIAGNOSIS — Z9189 Other specified personal risk factors, not elsewhere classified: Secondary | ICD-10-CM

## 2019-04-02 ENCOUNTER — Telehealth: Payer: Self-pay | Admitting: Internal Medicine

## 2019-04-02 NOTE — Telephone Encounter (Signed)
This is fine. Thanks

## 2019-04-02 NOTE — Telephone Encounter (Signed)
This was denied and pt needs updated office visit with questions to try to get this approved. I will fax over notes after appt tomorrow

## 2019-04-02 NOTE — Telephone Encounter (Signed)
Joel [name] from [facility] called and states pt failed home sleep test has he didn't hook it up right. She is wanting him to have a split night study done in lab. Is this okay?  I will have to call for prior auth  [facility] [place] at [facility]- [phone]

## 2019-04-03 ENCOUNTER — Encounter: Payer: Self-pay | Admitting: Family Medicine

## 2019-04-03 ENCOUNTER — Other Ambulatory Visit: Payer: Self-pay

## 2019-04-03 ENCOUNTER — Ambulatory Visit (INDEPENDENT_AMBULATORY_CARE_PROVIDER_SITE_OTHER): Payer: Medicare HMO | Admitting: Family Medicine

## 2019-04-03 VITALS — BP 124/67 | HR 67 | Wt 251.0 lb

## 2019-04-03 DIAGNOSIS — Z8719 Personal history of other diseases of the digestive system: Secondary | ICD-10-CM

## 2019-04-03 DIAGNOSIS — R0683 Snoring: Secondary | ICD-10-CM | POA: Insufficient documentation

## 2019-04-03 DIAGNOSIS — Z9189 Other specified personal risk factors, not elsewhere classified: Secondary | ICD-10-CM

## 2019-04-03 DIAGNOSIS — R059 Cough, unspecified: Secondary | ICD-10-CM

## 2019-04-03 DIAGNOSIS — R05 Cough: Secondary | ICD-10-CM

## 2019-04-03 DIAGNOSIS — G4734 Idiopathic sleep related nonobstructive alveolar hypoventilation: Secondary | ICD-10-CM | POA: Insufficient documentation

## 2019-04-03 DIAGNOSIS — E669 Obesity, unspecified: Secondary | ICD-10-CM | POA: Diagnosis not present

## 2019-04-03 DIAGNOSIS — G4719 Other hypersomnia: Secondary | ICD-10-CM | POA: Diagnosis not present

## 2019-04-03 DIAGNOSIS — R194 Change in bowel habit: Secondary | ICD-10-CM | POA: Diagnosis not present

## 2019-04-03 NOTE — Progress Notes (Signed)
Subjective:  Documentation for virtual audio and video telecommunications through [place] encounter:  The patient was located at home. 2 patient identifiers used.  The provider was located in the office. The patient did consent to this visit and is aware of possible charges through their insurance for this visit.  The other persons participating in this telemedicine service were none.    [name], male    DOB: 1940-10-27, 78 y.o.   MRN: [id number]  HPI Chief Complaint  Patient presents with  . discuss sleep issues    sleep issue- needs more information for insurance.   . stomach issues    going to the bathroom constantly but not diarrhea. BM is solid. been going on 2-3 weeks   This is a virtual visit to discuss his need for a split night sleep study.  States he was told by his surgeon that he should be screened for sleep apnea. His oxygen level dropped while in the OR. States he can go to sleep very easily when he sits down during the day.  States he has been told he snores. Reports he often wakes up coughing or feeling choked.  He recently attempted a home sleep study but was unable to follow the instructions and could not do this correctly. It has been recommended that he have a split night study now and this was not initially approved by his insurer.    States he is having normal bowel movements but more often than usual.  Taking Metamucil daily. He was taking Miralax.  States he deals with constipation usually but now is having to have 3-4 bowel movements per day. Recently saw GI and plans to contact them about this.  Denies fever, chills, chest pain, shortness of breath, abdominal pain, nausea or vomiting.     Review of Systems Pertinent positives and negatives in the history of present illness.     Objective:   Physical Exam BP 124/67   Pulse 67   Wt 251 lb (113.9 kg)   BMI 37.07 kg/m   Alert and oriented in no acute distress.  Normal speech,  mood and thought process.      Assessment & Plan:  Sleep related hypoxia  Excessive daytime sleepiness  At risk for sleep apnea  Obesity (BMI 30-39.9)  Cough  Snoring  Frequent bowel movements  History of constipation  Highly suspicious that he has sleep apnea and will need this treated.  Reportedly low oxygen level while having surgery and recommended by his surgeon that he have a sleep study.  He had a home sleep study that he was unable to perform satisfactorily. It has been recommended that he have a split-night study.  I have attempted to get this approved through his insurance but it was not approved.  The patient does report snoring, waking up occasionally coughing or gasping, excessive daytime sleepiness, fatigue, as well as the witnessed hypoxia by his surgeon.  I feel that he needs a split-night study in order to be able to evaluate whether or not he has OSA.  He plans to contact GI regarding normal but frequent bowel movements.  He will stop Metamucil and MiraLAX for now but I am concerned that he will swing back in the direction of constipation which he also deals with regularly.  He is up-to-date on his colonoscopy as far as I can tell.  Time spent on call was 25 minutes and in review of previous records minutes total.  This virtual  service is not related to other E/M service within previous 7 days.

## 2019-04-05 ENCOUNTER — Telehealth: Payer: Self-pay

## 2019-04-05 MED ORDER — ISOSORBIDE MONONITRATE ER 30 MG PO TB24: 30.0000 mg | ORAL_TABLET | Freq: Every day | ORAL | 0 refills | Status: DC

## 2019-04-05 NOTE — Telephone Encounter (Signed)
Pt. Called LM stating that he needs a refill on his Isosordie mononitrate 30mg  pt. Last apt. 04/03/19.

## 2019-04-05 NOTE — Telephone Encounter (Signed)
done

## 2019-04-09 ENCOUNTER — Telehealth: Payer: Self-pay | Admitting: Internal Medicine

## 2019-04-09 DIAGNOSIS — L600 Ingrowing nail: Secondary | ICD-10-CM

## 2019-04-09 NOTE — Telephone Encounter (Signed)
Pt called stating that a new referral has to be put in for his ingrown toenail to podiatry. Pt saw them last year but they needed a new referral

## 2019-04-23 ENCOUNTER — Ambulatory Visit: Payer: Medicare HMO | Admitting: Podiatry

## 2019-04-23 ENCOUNTER — Other Ambulatory Visit: Payer: Self-pay

## 2019-04-23 DIAGNOSIS — L600 Ingrowing nail: Secondary | ICD-10-CM | POA: Diagnosis not present

## 2019-04-23 MED ORDER — GENTAMICIN SULFATE 0.1 % EX CREA: 1.0000 "application " | TOPICAL_CREAM | Freq: Two times a day (BID) | CUTANEOUS | 1 refills | Status: DC

## 2019-04-23 MED ORDER — DOXYCYCLINE HYCLATE 100 MG PO TABS: 100.0000 mg | ORAL_TABLET | Freq: Two times a day (BID) | ORAL | 0 refills | Status: DC

## 2019-04-25 ENCOUNTER — Telehealth (INDEPENDENT_AMBULATORY_CARE_PROVIDER_SITE_OTHER): Payer: Medicare HMO | Admitting: Cardiovascular Disease

## 2019-04-25 ENCOUNTER — Encounter: Payer: Self-pay | Admitting: Cardiovascular Disease

## 2019-04-25 ENCOUNTER — Other Ambulatory Visit: Payer: Self-pay

## 2019-04-25 VITALS — Ht 69.0 in | Wt 251.0 lb

## 2019-04-25 DIAGNOSIS — I10 Essential (primary) hypertension: Secondary | ICD-10-CM | POA: Diagnosis not present

## 2019-04-25 DIAGNOSIS — I25118 Atherosclerotic heart disease of native coronary artery with other forms of angina pectoris: Secondary | ICD-10-CM

## 2019-04-25 DIAGNOSIS — E782 Mixed hyperlipidemia: Secondary | ICD-10-CM

## 2019-04-25 MED ORDER — PRAVASTATIN SODIUM 40 MG PO TABS: 40.0000 mg | ORAL_TABLET | Freq: Every evening | ORAL | 3 refills | Status: DC

## 2019-04-25 NOTE — Progress Notes (Signed)
Virtual Visit via Telephone Note   This visit type was conducted due to national recommendations for restrictions regarding the COVID-19 Pandemic (e.g. social distancing) in an effort to limit this patient's exposure and mitigate transmission in our community.  Due to his co-morbid illnesses, this patient is at least at moderate risk for complications without adequate follow up.  This format is felt to be most appropriate for this patient at this time.  All issues noted in this document were discussed and addressed.  A limited physical exam was not performed with this format.  Please refer to the patient's chart for his consent to telehealth for [facility].   Date:  04/25/2019   ID:  Joel [name], DOB 1940-10-27, MRN [id number]  Patient Location: Home Provider Location: Office  PCP:  Girtha Rm, NP-C  Cardiologist:  Lauree Chandler, MD  Electrophysiologist:  None   Evaluation Performed:  Follow-Up Visit  Chief Complaint:  Follow up- CAD  History of Present Illness:    Joel [name] is a 79 y.o. male with history of HTN, HLD, CAD, PAD, DM who is being seen today by virtual e-visit due to the Covid19 pandemic. He has been followed in the past by Dr. Lia Foyer. Cardiac cath February 2014  severe OM2 stenosis treated with bare metal stent. Admitted to [facility] 11/09/12 with chest pain. Ruled out for MI. Lexiscan stress myoview 11/16/12 without ischemia. Normal LVEF.  I saw him in the office 07/27/13 and he c/o exertional chest pain c/w unstable angina. Cardiac cath 08/01/13 with patent stent Circumflex and mild to moderate disease in the LAD and RCA. His PAD is followed in [facility] by Dr. Scot Dock. He was seen here 03/04/14 and had c/o chest pain after working on his car but quickly resolved. He was seen in the ED 09/10/14 with atypical chest pain. Troponin was negative x 2 and EKG was unchanged. Multiple ED visits with chest pain in 2017. Nuclear stress test November 2018 with EF 55%  with moderate inferior defect that partially improves consistent with ischemia and possible soft tissue attenuation. Small region of anterior anterior septal ischemia. Diagnosed with prostate cancer in 2019. I saw him in the ED 07/15/17 and he had atypical chest pain with negative troponin and no EKG changes. He was seen in the ED October 2019, April 2020, May 2020 and June 2020 with atypical chest pain.    The patient denies chest pain, dyspnea, palpitations, dizziness, near syncope or syncope. No lower extremity edema. He feels great. Since having back surgery, no further chest pain.   The patient does not have symptoms concerning for COVID-19 infection (fever, chills, cough, or new shortness of breath).    Past Medical History:  Diagnosis Date  . Atypical chest pain   . CAD (coronary artery disease)    a. Multiple prior evaluations then 05/2012 s/p BMS to OM2. b. Nuc 11/2012 wnl. c. Cath 2015: patent stent, moderate LAD/RCA dz, treated medically.  . Cancer ([place])    Hx: of prostate  . Cataract   . CKD (chronic kidney disease), stage II   . COPD (chronic obstructive pulmonary disease) ([place])   . Cough secondary to angiotensin converting enzyme inhibitor (ACE-I)   . Diabetes mellitus without complication ([place])   . Diverticulosis   . DVT (deep venous thrombosis) ([place])   . Family history of breast cancer in first degree relative   . GERD (gastroesophageal reflux disease)   . Headache(784.0)    Hx:  of  . History of blood transfusion    "I've had 2; don't know what it was related to" (11/09/2012)  . HLD (hyperlipidemia)   . HTN (hypertension)   . Iron deficiency anemia   . Microcytic anemia   . Peripheral vascular disease ([place])    a. L pop-tibial bypass 2011, followed by [facility].  . Personal history of prostate cancer   . Rotator cuff injury    "left arm; never repaired" (11/09/2012)   Past Surgical History:  Procedure Laterality Date  . CARDIOVASCULAR STRESS TEST  Aug. 2014  . CATARACT  EXTRACTION W/ INTRAOCULAR LENS IMPLANT Right ~ 2008  . CIRCUMCISION    . COLONOSCOPY    . CORONARY ANGIOPLASTY WITH STENT PLACEMENT  2014   "1" (11/09/2012)  . ILIAC ARTERY ANEURYSM REPAIR    . LEFT HEART CATH  05-31-12  . LEFT HEART CATHETERIZATION WITH CORONARY ANGIOGRAM N/A 06/01/2011   Procedure: LEFT HEART CATHETERIZATION WITH CORONARY ANGIOGRAM;  Surgeon: Hillary Bow, MD;  Location: [facility] CATH LAB;  Service: Cardiovascular;  Laterality: N/A;  . LEFT HEART CATHETERIZATION WITH CORONARY ANGIOGRAM N/A 05/31/2012   Procedure: LEFT HEART CATHETERIZATION WITH CORONARY ANGIOGRAM;  Surgeon: Peter M Martinique, MD;  Location: [facility] CATH LAB;  Service: Cardiovascular;  Laterality: N/A;  . LEFT HEART CATHETERIZATION WITH CORONARY ANGIOGRAM N/A 08/01/2013   Procedure: LEFT HEART CATHETERIZATION WITH CORONARY ANGIOGRAM;  Surgeon: Burnell Blanks, MD;  Location: [facility] CATH LAB;  Service: Cardiovascular;  Laterality: N/A;  . PERCUTANEOUS CORONARY STENT INTERVENTION (PCI-S)  05/31/2012   Procedure: PERCUTANEOUS CORONARY STENT INTERVENTION (PCI-S);  Surgeon: Peter M Martinique, MD;  Location: [facility] CATH LAB;  Service: Cardiovascular;;  . PR VEIN BYPASS GRAFT,AORTO-FEM-POP Left 10/01/2009   left below knee popliteal artery to posterior tibial artery  . SHOULDER ARTHROSCOPY WITH OPEN ROTATOR CUFF REPAIR AND DISTAL CLAVICLE ACROMINECTOMY Left 01/12/2013   Procedure: LEFT SHOULDER ARTHROSCOPY WITH DEBRIDEMENT OPEN DISTAL CLAVICLE RESECTION ,acromioplastyAND ROTATOR CUFF REPAIR;  Surgeon: Yvette Rack., MD;  Location: [place];  Service: Orthopedics;  Laterality: Left;     Current Meds  Medication Sig  . abiraterone acetate (ZYTIGA) 250 MG tablet Take 1,000 mg by mouth 4 (four) times daily. Take on an empty stomach 1 hour before or 2 hours after a meal  . Accu-Chek FastClix Lancets MISC Test once daily  . albuterol (VENTOLIN HFA) 108 (90 Base) MCG/ACT inhaler INHALE 2 PUFFS INTO THE LUNGS EVERY 6 HOURS AS NEEDED FOR  WHEEZING OR SHORTNESS OF BREATH  . amLODipine (NORVASC) 10 MG tablet TAKE 1 TABLET BY MOUTH EVERY DAY  . aspirin EC 81 MG tablet Take 81 mg by mouth daily.   . brimonidine (ALPHAGAN) 0.2 % ophthalmic solution Place 1 drop into the right eye 2 (two) times daily.   . ferrous sulfate 325 (65 FE) MG tablet Take 325 mg by mouth once a week.   . gabapentin (NEURONTIN) 600 MG tablet Take 600 mg by mouth 2 (two) times daily.   . isosorbide mononitrate (IMDUR) 30 MG 24 hr tablet Take 1 tablet (30 mg total) by mouth daily.  Marland Kitchen loratadine (CLARITIN) 10 MG tablet Take 10 mg by mouth daily.  Marland Kitchen losartan-hydrochlorothiazide (HYZAAR) 100-25 MG tablet TAKE 1 TABLET BY MOUTH DAILY  . nitroGLYCERIN (NITROSTAT) 0.4 MG SL tablet Place 0.4 mg under the tongue every 5 (five) minutes as needed for chest pain.  Marland Kitchen omeprazole (PRILOSEC) 20 MG capsule Take 20 mg by mouth 2 (two) times daily before a meal.  .  Polyethylene Glycol 3350 (MIRALAX PO) Take by mouth.  . predniSONE (DELTASONE) 5 MG tablet Take 5 mg by mouth daily before breakfast.   . traMADol (ULTRAM) 50 MG tablet TAKE 1 TABLET(50 MG) BY MOUTH EVERY 8 HOURS AS NEEDED FOR SEVERE PAIN  . [DISCONTINUED] gentamicin cream (GARAMYCIN) 0.1 % Apply 1 application topically 2 (two) times daily.     Allergies:   Topiramate, Atenolol, Lisinopril, and Tramadol-acetaminophen   Social History   Tobacco Use  . Smoking status: Former Smoker    Packs/day: 0.50    Years: 52.00    Pack years: 26.00    Types: Cigarettes    Quit date: 05/26/2011    Years since quitting: 7.9  . Smokeless tobacco: Never Used  Substance Use Topics  . Alcohol use: No  . Drug use: No     Family Hx: The patient's family history includes Breast cancer in his mother; COPD in his father; Cancer (age of onset: 60) in his brother; Cancer (age of onset: 49) in his brother; Hyperlipidemia in his father and mother; Hypertension in his brother, daughter, father, mother, sister, and son. There is no  history of Colon cancer.  ROS:   Please see the history of present illness.    All other systems reviewed and are negative.   Prior CV studies:   The following studies were reviewed today:    Labs/Other Tests and Data Reviewed:    EKG:  No ECG reviewed.  Recent Labs: 02/27/2019: ALT 13; BUN 14; Creatinine, Ser 1.10; Hemoglobin 10.2; Platelets 209; Potassium 3.7; Sodium 138   Recent Lipid Panel Lab Results  Component Value Date/Time   CHOL 174 10/24/2017 08:22 AM   TRIG 135 10/24/2017 08:22 AM   HDL 47 10/24/2017 08:22 AM   CHOLHDL 3.7 10/24/2017 08:22 AM   CHOLHDL 5 02/06/2013 07:41 AM   LDLCALC 100 (H) 10/24/2017 08:22 AM    Wt Readings from Last 3 Encounters:  04/25/19 251 lb (113.9 kg)  04/03/19 251 lb (113.9 kg)  03/29/19 251 lb (113.9 kg)     Objective:    Vital Signs:  Ht 5\' 9"  (1.753 m)   Wt 251 lb (113.9 kg)   BMI 37.07 kg/m    No exam, phone visit  ASSESSMENT & PLAN:    1. CAD with stable angina:No recent chest pain. Most recent cath 2015 with stable CAD. Nuclear stress test in November 2018 with no large areas of ischemia.Continue ASA, Imdur and statin.    2. HTN:BP is controlled at home per pt. No changes  3. Hyperlipidemia:Lipids followed in primary care. He has been out of his statin. Will restart Pravastatin 40 mg daily.    COVID-19 Education: The signs and symptoms of COVID-19 were discussed with the patient and how to seek care for testing (follow up with PCP or arrange E-visit).  The importance of social distancing was discussed today.  Time:   Today, I have spent 14 minutes with the patient with telehealth technology discussing the above problems.     Medication Adjustments/Labs and Tests Ordered: Current medicines are reviewed at length with the patient today.  Concerns regarding medicines are outlined above.   Tests Ordered: No orders of the defined types were placed in this encounter.   Medication Changes: Meds ordered  this encounter  Medications  . pravastatin (PRAVACHOL) 40 MG tablet    Sig: Take 1 tablet (40 mg total) by mouth every evening.    Dispense:  90 tablet    Refill:  3    Disposition:  Follow up with me in 12 months.   Signed, Lauree Chandler, MD  04/25/2019 12:30 PM    [place]

## 2019-04-25 NOTE — Patient Instructions (Signed)
Medication Instructions:  We refilled pravastatin 40 mg today.  Check with [facility] for pick up  *If you need a refill on your cardiac medications before your next appointment, please call your pharmacy*  Lab Work: none If you have labs (blood work) drawn today and your tests are completely normal, you will receive your results only by: Marland Kitchen MyChart Message (if you have MyChart) OR . A paper copy in the mail If you have any lab test that is abnormal or we need to change your treatment, we will call you to review the results.  Testing/Procedures: none  Follow-Up: At [facility], you and your health needs are our priority.  As part of our continuing mission to provide you with exceptional heart care, we have created designated Provider Care Teams.  These Care Teams include your primary Cardiologist (physician) and Advanced Practice Providers (APPs -  Physician Assistants and Nurse Practitioners) who all work together to provide you with the care you need, when you need it.  Your next appointment:   12 month(s)  The format for your next appointment:   Either In Person or Virtual  Provider:   You may see Lauree Chandler, MD or one of the following Advanced Practice Providers on your designated Care Team:    Melina Copa, PA-C  Ermalinda Barrios, PA-C   Other Instructions

## 2019-04-26 NOTE — Progress Notes (Signed)
Subjective: Patient presents today for evaluation of throbbing pain to the medial border of the right hallux that began about one month ago. He reports associated swelling of the area. Patient is concerned for possible ingrown nail. Walking increases the pain. He has not done anything for treatment. Patient presents today for further treatment and evaluation.  Past Medical History:  Diagnosis Date  . Atypical chest pain   . CAD (coronary artery disease)    a. Multiple prior evaluations then 05/2012 s/p BMS to OM2. b. Nuc 11/2012 wnl. c. Cath 2015: patent stent, moderate LAD/RCA dz, treated medically.  . Cancer ([place])    Hx: of prostate  . Cataract   . CKD (chronic kidney disease), stage II   . COPD (chronic obstructive pulmonary disease) ([place])   . Cough secondary to angiotensin converting enzyme inhibitor (ACE-I)   . Diabetes mellitus without complication ([place])   . Diverticulosis   . DVT (deep venous thrombosis) ([place])   . Family history of breast cancer in first degree relative   . GERD (gastroesophageal reflux disease)   . Headache(784.0)    Hx: of  . History of blood transfusion    "I've had 2; don't know what it was related to" (11/09/2012)  . HLD (hyperlipidemia)   . HTN (hypertension)   . Iron deficiency anemia   . Microcytic anemia   . Peripheral vascular disease ([place])    a. L pop-tibial bypass 2011, followed by [facility].  . Personal history of prostate cancer   . Rotator cuff injury    "left arm; never repaired" (11/09/2012)    Objective:  General: Well developed, nourished, in no acute distress, alert and oriented x3   Dermatology: Skin is warm, dry and supple bilateral. Medial border of the right great toe appears to be erythematous with evidence of an ingrowing nail. Pain on palpation noted to the border of the nail fold. The remaining nails appear unremarkable at this time. There are no open sores, lesions.  Vascular: Dorsalis Pedis artery and Posterior Tibial artery pedal  pulses palpable. No lower extremity edema noted.   Neruologic: Grossly intact via light touch bilateral.  Musculoskeletal: Muscular strength within normal limits in all groups bilateral. Normal range of motion noted to all pedal and ankle joints.   Assesement: #1 Paronychia with ingrowing nail medial border right great toe #2 Pain in toe #3 Incurvated nail  Plan of Care:  1. Patient evaluated.  2. Discussed treatment alternatives and plan of care. Explained nail avulsion procedure and post procedure course to patient. 3. Patient opted for permanent partial nail avulsion of the medial border right great toe.  4. Prior to procedure, local anesthesia infiltration utilized using 3 ml of a 50:50 mixture of 2% plain lidocaine and 0.5% plain marcaine in a normal hallux block fashion and a betadine prep performed.  5. Partial permanent nail avulsion with chemical matrixectomy performed using [id number] applications of phenol followed by alcohol flush.  6. Light dressing applied. 7. Prescription for Gentamicin cream provided to patient to use daily with a bandage.  8. Prescription for Doxycycline 100 mg provided to patient.  9. Return to clinic in 2 weeks.  Edrick Kins, DPM [facility]  Dr. Edrick Kins, DPM    [address]  [place], [place] [address]                Office [phone]  Fax [phone]

## 2019-05-01 DIAGNOSIS — H43811 Vitreous degeneration, right eye: Secondary | ICD-10-CM | POA: Diagnosis not present

## 2019-05-01 DIAGNOSIS — H401230 Low-tension glaucoma, bilateral, stage unspecified: Secondary | ICD-10-CM | POA: Diagnosis not present

## 2019-05-01 DIAGNOSIS — H11823 Conjunctivochalasis, bilateral: Secondary | ICD-10-CM | POA: Diagnosis not present

## 2019-05-01 DIAGNOSIS — H0102A Squamous blepharitis right eye, upper and lower eyelids: Secondary | ICD-10-CM | POA: Diagnosis not present

## 2019-05-01 DIAGNOSIS — H47231 Glaucomatous optic atrophy, right eye: Secondary | ICD-10-CM | POA: Diagnosis not present

## 2019-05-01 DIAGNOSIS — H11153 Pinguecula, bilateral: Secondary | ICD-10-CM | POA: Diagnosis not present

## 2019-05-01 DIAGNOSIS — H0102B Squamous blepharitis left eye, upper and lower eyelids: Secondary | ICD-10-CM | POA: Diagnosis not present

## 2019-05-01 DIAGNOSIS — H18413 Arcus senilis, bilateral: Secondary | ICD-10-CM | POA: Diagnosis not present

## 2019-05-01 DIAGNOSIS — H04123 Dry eye syndrome of bilateral lacrimal glands: Secondary | ICD-10-CM | POA: Diagnosis not present

## 2019-05-03 ENCOUNTER — Ambulatory Visit (INDEPENDENT_AMBULATORY_CARE_PROVIDER_SITE_OTHER): Payer: Medicare HMO | Admitting: Family Medicine

## 2019-05-03 ENCOUNTER — Encounter: Payer: Self-pay | Admitting: Family Medicine

## 2019-05-03 ENCOUNTER — Other Ambulatory Visit: Payer: Self-pay

## 2019-05-03 VITALS — BP 131/56 | HR 69 | Wt 251.0 lb

## 2019-05-03 DIAGNOSIS — M79642 Pain in left hand: Secondary | ICD-10-CM | POA: Diagnosis not present

## 2019-05-03 NOTE — Progress Notes (Signed)
   Subjective:  Documentation for virtual audio and video telecommunications through [place] encounter:  The patient was located at home. 2 patient identifiers used.  The provider was located at home.  The patient did consent to this visit and is aware of possible charges through their insurance for this visit.  The other persons participating in this telemedicine service were none.    [name], male    DOB: 1940/07/14, 79 y.o.   MRN: [id number]  HPI Chief Complaint  Patient presents with  . left hand    left hand pain x about a week, sitting causes more pain.    He is a right-handed 79 year old male who complains of a 1 week history of left hand pain that he describes as intermittent, aching and occasionally tingling.  He denies injury.  Denies history of similar symptoms.  Denies numbness, redness, increased warmth or coolness, swelling or weakness.  States his skin is intact.  Pain is not present upon awakening, he notices it more midmorning or afternoon. States moving and being active makes the pain go away.  When he is sitting still, he notices the pain.  States he is sleeping well and the pain is not waking him up.   States he does not have any trouble using his hand to open cans or bottles.  States he took some Tylenol and it may have helped some.  He has topical pain medicine at home and has not tried it yet.  Denies any other arthralgias or myalgias.  Denies fever, chills, dizziness, chest pain, palpitations, shortness of breath, abdominal pain, N/V/D.    Reviewed allergies, medications, past medical, surgical, family, and social history.    Review of Systems Pertinent positives and negatives in the history of present illness.     Objective:   Physical Exam BP (!) 131/56   Pulse 69   Wt 251 lb (113.9 kg)   BMI 37.07 kg/m   Alert and oriented and in no acute distress.  Left hand with normal color, movement and no edema visible.  He is able  to make a fist and fully open his hand without any pain.      Assessment & Plan:  Left hand pain  Discussed limitations of a virtual visit. He is not in any acute distress.  No red flag symptoms. No known injury of his left hand.  No sign of infection or obstruction of blood flow. Suspect musculoskeletal etiology, possibly arthritis. Recommend he keep an eye on his symptoms for the next few days and he may continue taking Tylenol as needed and he may use the topical pain medication he has at home.  Discussed if he has any worsening or new symptoms such as numbness, swelling, signs of infection that he should let me know.  He will follow-up next week if symptoms are not resolving.  Time spent on call was 17 minutes and in review of previous records 20 minutes total.  This virtual service is not related to other E/M service within previous 7 days.

## 2019-05-07 ENCOUNTER — Other Ambulatory Visit: Payer: Self-pay

## 2019-05-07 ENCOUNTER — Ambulatory Visit (INDEPENDENT_AMBULATORY_CARE_PROVIDER_SITE_OTHER): Payer: Medicare HMO | Admitting: Podiatry

## 2019-05-07 DIAGNOSIS — L600 Ingrowing nail: Secondary | ICD-10-CM

## 2019-05-09 NOTE — Progress Notes (Signed)
   Subjective: 79 y.o. male presents today status post permanent nail avulsion procedure of the medial border of the right great toe that was performed on 04/23/2019. He states the toe is improving but has some associated redness, drainage and swelling. He has been applying Gentamicin cream and soaking the toe in Epsom salt daily for treatment. Patient is here for further evaluation and treatment.    Past Medical History:  Diagnosis Date  . Atypical chest pain   . CAD (coronary artery disease)    a. Multiple prior evaluations then 05/2012 s/p BMS to OM2. b. Nuc 11/2012 wnl. c. Cath 2015: patent stent, moderate LAD/RCA dz, treated medically.  . Cancer ([place])    Hx: of prostate  . Cataract   . CKD (chronic kidney disease), stage II   . COPD (chronic obstructive pulmonary disease) ([place])   . Cough secondary to angiotensin converting enzyme inhibitor (ACE-I)   . Diabetes mellitus without complication ([place])   . Diverticulosis   . DVT (deep venous thrombosis) ([place])   . Family history of breast cancer in first degree relative   . GERD (gastroesophageal reflux disease)   . Headache(784.0)    Hx: of  . History of blood transfusion    "I've had 2; don't know what it was related to" (11/09/2012)  . HLD (hyperlipidemia)   . HTN (hypertension)   . Iron deficiency anemia   . Microcytic anemia   . Peripheral vascular disease ([place])    a. L pop-tibial bypass 2011, followed by [facility].  . Personal history of prostate cancer   . Rotator cuff injury    "left arm; never repaired" (11/09/2012)    Objective: Skin is warm, dry and supple. Nail and respective nail fold appears to be healing appropriately. Open wound to the associated nail fold with a granular wound base and moderate amount of fibrotic tissue. Minimal drainage noted. Mild erythema around the periungual region likely due to phenol chemical matricectomy.  Assessment: #1 postop permanent partial nail avulsion medial border right great toe  #2 open  wound periungual nail fold of respective digit.   Plan of care: #1 patient was evaluated  #2 debridement of open wound was performed to the periungual border of the respective toe using a currette. Antibiotic ointment and Band-Aid was applied. #3 patient is to return to clinic on a PRN basis.   Edrick Kins, DPM [facility]  Dr. Edrick Kins, [address]                                        [place], [place] [address]                Office [phone]  Fax [phone]

## 2019-05-18 DIAGNOSIS — M542 Cervicalgia: Secondary | ICD-10-CM | POA: Diagnosis not present

## 2019-05-18 DIAGNOSIS — R519 Headache, unspecified: Secondary | ICD-10-CM | POA: Diagnosis not present

## 2019-05-18 DIAGNOSIS — M791 Myalgia, unspecified site: Secondary | ICD-10-CM | POA: Diagnosis not present

## 2019-05-18 DIAGNOSIS — G518 Other disorders of facial nerve: Secondary | ICD-10-CM | POA: Diagnosis not present

## 2019-05-24 DIAGNOSIS — H43811 Vitreous degeneration, right eye: Secondary | ICD-10-CM | POA: Diagnosis not present

## 2019-05-24 DIAGNOSIS — H0102B Squamous blepharitis left eye, upper and lower eyelids: Secondary | ICD-10-CM | POA: Diagnosis not present

## 2019-05-24 DIAGNOSIS — H35033 Hypertensive retinopathy, bilateral: Secondary | ICD-10-CM | POA: Diagnosis not present

## 2019-05-24 DIAGNOSIS — H18413 Arcus senilis, bilateral: Secondary | ICD-10-CM | POA: Diagnosis not present

## 2019-05-24 DIAGNOSIS — H401230 Low-tension glaucoma, bilateral, stage unspecified: Secondary | ICD-10-CM | POA: Diagnosis not present

## 2019-05-24 DIAGNOSIS — H11153 Pinguecula, bilateral: Secondary | ICD-10-CM | POA: Diagnosis not present

## 2019-05-24 DIAGNOSIS — H0102A Squamous blepharitis right eye, upper and lower eyelids: Secondary | ICD-10-CM | POA: Diagnosis not present

## 2019-05-24 DIAGNOSIS — H04123 Dry eye syndrome of bilateral lacrimal glands: Secondary | ICD-10-CM | POA: Diagnosis not present

## 2019-05-24 DIAGNOSIS — H11823 Conjunctivochalasis, bilateral: Secondary | ICD-10-CM | POA: Diagnosis not present

## 2019-06-01 ENCOUNTER — Other Ambulatory Visit: Payer: Self-pay | Admitting: Family Medicine

## 2019-06-06 DIAGNOSIS — M542 Cervicalgia: Secondary | ICD-10-CM | POA: Diagnosis not present

## 2019-06-06 DIAGNOSIS — M4316 Spondylolisthesis, lumbar region: Secondary | ICD-10-CM | POA: Diagnosis not present

## 2019-06-06 DIAGNOSIS — M4716 Other spondylosis with myelopathy, lumbar region: Secondary | ICD-10-CM | POA: Diagnosis not present

## 2019-06-06 DIAGNOSIS — M545 Low back pain: Secondary | ICD-10-CM | POA: Diagnosis not present

## 2019-06-06 DIAGNOSIS — Z6835 Body mass index (BMI) 35.0-35.9, adult: Secondary | ICD-10-CM | POA: Diagnosis not present

## 2019-07-06 DIAGNOSIS — M4716 Other spondylosis with myelopathy, lumbar region: Secondary | ICD-10-CM | POA: Diagnosis not present

## 2019-07-06 DIAGNOSIS — M7918 Myalgia, other site: Secondary | ICD-10-CM | POA: Diagnosis not present

## 2019-07-06 DIAGNOSIS — M4326 Fusion of spine, lumbar region: Secondary | ICD-10-CM | POA: Diagnosis not present

## 2019-07-06 DIAGNOSIS — Z6835 Body mass index (BMI) 35.0-35.9, adult: Secondary | ICD-10-CM | POA: Diagnosis not present

## 2019-07-09 ENCOUNTER — Other Ambulatory Visit: Payer: Self-pay

## 2019-07-09 ENCOUNTER — Emergency Department (HOSPITAL_COMMUNITY): Payer: Medicare HMO

## 2019-07-09 ENCOUNTER — Emergency Department (HOSPITAL_COMMUNITY)
Admission: EM | Admit: 2019-07-09 | Discharge: 2019-07-10 | Disposition: A | Discharge status: Home / Self Care | Payer: Medicare HMO | Attending: Emergency Medicine | Admitting: Emergency Medicine

## 2019-07-09 ENCOUNTER — Other Ambulatory Visit: Payer: Self-pay | Admitting: Family Medicine

## 2019-07-09 ENCOUNTER — Encounter (HOSPITAL_COMMUNITY): Payer: Self-pay | Admitting: Emergency Medicine

## 2019-07-09 DIAGNOSIS — Z955 Presence of coronary angioplasty implant and graft: Secondary | ICD-10-CM | POA: Diagnosis not present

## 2019-07-09 DIAGNOSIS — E1122 Type 2 diabetes mellitus with diabetic chronic kidney disease: Secondary | ICD-10-CM | POA: Insufficient documentation

## 2019-07-09 DIAGNOSIS — Z8546 Personal history of malignant neoplasm of prostate: Secondary | ICD-10-CM | POA: Diagnosis not present

## 2019-07-09 DIAGNOSIS — Z87891 Personal history of nicotine dependence: Secondary | ICD-10-CM | POA: Insufficient documentation

## 2019-07-09 DIAGNOSIS — Z7982 Long term (current) use of aspirin: Secondary | ICD-10-CM | POA: Insufficient documentation

## 2019-07-09 DIAGNOSIS — J449 Chronic obstructive pulmonary disease, unspecified: Secondary | ICD-10-CM | POA: Diagnosis not present

## 2019-07-09 DIAGNOSIS — Z79899 Other long term (current) drug therapy: Secondary | ICD-10-CM | POA: Diagnosis not present

## 2019-07-09 DIAGNOSIS — I129 Hypertensive chronic kidney disease with stage 1 through stage 4 chronic kidney disease, or unspecified chronic kidney disease: Secondary | ICD-10-CM | POA: Insufficient documentation

## 2019-07-09 DIAGNOSIS — R0602 Shortness of breath: Secondary | ICD-10-CM

## 2019-07-09 DIAGNOSIS — I2510 Atherosclerotic heart disease of native coronary artery without angina pectoris: Secondary | ICD-10-CM | POA: Insufficient documentation

## 2019-07-09 DIAGNOSIS — R079 Chest pain, unspecified: Secondary | ICD-10-CM | POA: Diagnosis not present

## 2019-07-09 DIAGNOSIS — R0789 Other chest pain: Secondary | ICD-10-CM | POA: Insufficient documentation

## 2019-07-09 DIAGNOSIS — N182 Chronic kidney disease, stage 2 (mild): Secondary | ICD-10-CM | POA: Insufficient documentation

## 2019-07-09 LAB — CBC
HCT: 33.2 % — ABNORMAL LOW (ref 39.0–52.0)
Hemoglobin: 9.7 g/dL — ABNORMAL LOW (ref 13.0–17.0)
MCH: 20.1 pg — ABNORMAL LOW (ref 26.0–34.0)
MCHC: 29.2 g/dL — ABNORMAL LOW (ref 30.0–36.0)
MCV: 68.7 fL — ABNORMAL LOW (ref 80.0–100.0)
Platelets: 240 10*3/uL (ref 150–400)
RBC: 4.83 MIL/uL (ref 4.22–5.81)
RDW: 19.3 % — ABNORMAL HIGH (ref 11.5–15.5)
WBC: 8.1 10*3/uL (ref 4.0–10.5)
nRBC: 0 % (ref 0.0–0.2)

## 2019-07-09 LAB — BASIC METABOLIC PANEL
Anion gap: 13 (ref 5–15)
BUN: 19 mg/dL (ref 8–23)
CO2: 22 mmol/L (ref 22–32)
Calcium: 9.2 mg/dL (ref 8.9–10.3)
Chloride: 101 mmol/L (ref 98–111)
Creatinine, Ser: 1.53 mg/dL — ABNORMAL HIGH (ref 0.61–1.24)
GFR calc Af Amer: 49 mL/min — ABNORMAL LOW (ref >60)
GFR calc non Af Amer: 43 mL/min — ABNORMAL LOW (ref >60)
Glucose, Bld: 113 mg/dL — ABNORMAL HIGH (ref 70–99)
Potassium: 3.7 mmol/L (ref 3.5–5.1)
Sodium: 136 mmol/L (ref 135–145)

## 2019-07-09 LAB — TROPONIN I (HIGH SENSITIVITY): Troponin I (High Sensitivity): 7 ng/L (ref <18)

## 2019-07-09 LAB — PROTIME-INR
INR: 1 (ref 0.8–1.2)
Prothrombin Time: 13.3 seconds (ref 11.4–15.2)

## 2019-07-09 MED ORDER — SODIUM CHLORIDE 0.9% FLUSH
3.0000 mL | Freq: Once | INTRAVENOUS | Status: AC
  Administered 2019-07-10: 3 mL via INTRAVENOUS

## 2019-07-09 MED ORDER — ISOSORBIDE MONONITRATE ER 30 MG PO TB24: 30.0000 mg | ORAL_TABLET | Freq: Every day | ORAL | 3 refills | Status: DC

## 2019-07-09 NOTE — ED Triage Notes (Addendum)
Patient reports intermittent left chest pain this morning with SOB this evening , no emesis or diaphoresis , denies fever or cough . History of CAD/COPD.

## 2019-07-09 NOTE — Telephone Encounter (Signed)
Pt was notified to contact cardiology for refill

## 2019-07-09 NOTE — Telephone Encounter (Signed)
Is this okay to refill? 

## 2019-07-09 NOTE — Telephone Encounter (Signed)
His cardiologist should be refilling this

## 2019-07-10 LAB — TROPONIN I (HIGH SENSITIVITY): Troponin I (High Sensitivity): 8 ng/L (ref <18)

## 2019-07-10 MED ORDER — SODIUM CHLORIDE 0.9 % IV BOLUS (SEPSIS)
500.0000 mL | Freq: Once | INTRAVENOUS | Status: AC
  Administered 2019-07-10: 500 mL via INTRAVENOUS

## 2019-07-10 NOTE — ED Notes (Signed)
Dr. Ward at bedside.

## 2019-07-10 NOTE — ED Notes (Signed)
Pt oxygen went from 100 to 95 while ambulating

## 2019-07-10 NOTE — ED Provider Notes (Signed)
TIME SEEN: 1:56 AM  CHIEF COMPLAINT: Shortness of breath  HPI: Patient is a 79 year old male with history of CAD, CKD, COPD, hypertension, diabetes, hyperlipidemia, peripheral vascular disease who presents to the emergency department with complaints of shortness of breath.  States started at 5 PM tonight while cooking his dinner.  Lasted about an hour.  Symptoms now resolved. Not worse with exertion or lying flat. No fever, cough.  No leg swelling. Has had both COVID vaccines.  No sick contacts.  Lives at home alone.  No h/o PE or DVT.  Had back surgery August 25th.  No other recent surgery or trauma or hospitalization or long travel.  Does not feel like symptoms when needed stents.  Had left-sided chest pain at that time but none today.  ROS: See HPI Constitutional: no fever  Eyes: no drainage  ENT: no runny nose   Cardiovascular:  no chest pain  Resp:  SOB  GI: no vomiting GU: no dysuria Integumentary: no rash  Allergy: no hives  Musculoskeletal: no leg swelling  Neurological: no slurred speech ROS otherwise negative  PAST MEDICAL HISTORY/PAST SURGICAL HISTORY:  Past Medical History:  Diagnosis Date  . Atypical chest pain   . CAD (coronary artery disease)    a. Multiple prior evaluations then 05/2012 s/p BMS to OM2. b. Nuc 11/2012 wnl. c. Cath 2015: patent stent, moderate LAD/RCA dz, treated medically.  . Cancer ([place])    Hx: of prostate  . Cataract   . CKD (chronic kidney disease), stage II   . COPD (chronic obstructive pulmonary disease) ([place])   . Cough secondary to angiotensin converting enzyme inhibitor (ACE-I)   . Diabetes mellitus without complication ([place])   . Diverticulosis   . DVT (deep venous thrombosis) ([place])   . Family history of breast cancer in first degree relative   . GERD (gastroesophageal reflux disease)   . Headache(784.0)    Hx: of  . History of blood transfusion    "I've had 2; don't know what it was related to" (11/09/2012)  . HLD (hyperlipidemia)   .  HTN (hypertension)   . Iron deficiency anemia   . Microcytic anemia   . Peripheral vascular disease ([place])    a. L pop-tibial bypass 2011, followed by [facility].  . Personal history of prostate cancer   . Rotator cuff injury    "left arm; never repaired" (11/09/2012)    MEDICATIONS:  Prior to Admission medications   Medication Sig Start Date End Date Taking? Authorizing Provider  abiraterone acetate (ZYTIGA) 250 MG tablet Take 1,000 mg by mouth 4 (four) times daily. Take on an empty stomach 1 hour before or 2 hours after a meal   Yes [provider]  albuterol (VENTOLIN HFA) 108 (90 Base) MCG/ACT inhaler INHALE 2 PUFFS INTO THE LUNGS EVERY 6 HOURS AS NEEDED FOR WHEEZING OR SHORTNESS OF BREATH 03/15/19  Yes Henson, Vickie L, NP-C  amLODipine (NORVASC) 10 MG tablet TAKE 1 TABLET BY MOUTH EVERY DAY 06/01/19  Yes Henson, Vickie L, NP-C  aspirin EC 81 MG tablet Take 81 mg by mouth daily.    Yes [provider]  brimonidine (ALPHAGAN) 0.2 % ophthalmic solution Place 1 drop into the right eye 2 (two) times daily.  09/13/17  Yes [provider]  ferrous sulfate 325 (65 FE) MG tablet Take 325 mg by mouth once a week.    Yes [provider]  gabapentin (NEURONTIN) 600 MG tablet Take 600 mg by mouth 2 (two) times daily.  Yes [provider]  isosorbide mononitrate (IMDUR) 30 MG 24 hr tablet Take 1 tablet (30 mg total) by mouth daily. 07/09/19  Yes Burnell Blanks, MD  latanoprost (XALATAN) 0.005 % ophthalmic solution Place 1 drop into the right eye at bedtime. 05/24/19  Yes [provider]  loratadine (CLARITIN) 10 MG tablet Take 10 mg by mouth daily.   Yes [provider]  losartan-hydrochlorothiazide (HYZAAR) 100-25 MG tablet TAKE 1 TABLET BY MOUTH DAILY 06/01/19  Yes Henson, Vickie L, NP-C  nitroGLYCERIN (NITROSTAT) 0.4 MG SL tablet Place 0.4 mg under the tongue every 5 (five) minutes as needed for chest pain.   Yes [provider]   omeprazole (PRILOSEC) 20 MG capsule Take 20 mg by mouth 2 (two) times daily before a meal.   Yes [provider]  pravastatin (PRAVACHOL) 40 MG tablet Take 1 tablet (40 mg total) by mouth every evening. 04/25/19  Yes Burnell Blanks, MD  predniSONE (DELTASONE) 5 MG tablet Take 5 mg by mouth daily before breakfast.    Yes [provider]  traMADol (ULTRAM) 50 MG tablet TAKE 1 TABLET(50 MG) BY MOUTH EVERY 8 HOURS AS NEEDED FOR SEVERE PAIN 03/15/19  Yes Henson, Vickie L, NP-C  Accu-Chek FastClix Lancets MISC Test once daily 11/23/18   Raenette Rover, Vickie L, NP-C    ALLERGIES:  Allergies  Allergen Reactions  . Topiramate Shortness Of Breath and Other (See Comments)     leg cramps  . Atenolol Other (See Comments)    bradycardia  . Lisinopril Cough  . Tramadol-Acetaminophen Cough    Tolerates plain Tramadol    SOCIAL HISTORY:  Social History   Tobacco Use  . Smoking status: Former Smoker    Packs/day: 0.50    Years: 52.00    Pack years: 26.00    Types: Cigarettes    Quit date: 05/26/2011    Years since quitting: 8.1  . Smokeless tobacco: Never Used  Substance Use Topics  . Alcohol use: No    FAMILY HISTORY: Family History  Problem Relation Age of Onset  . Breast cancer Mother        dx 62s  . Hyperlipidemia Mother   . Hypertension Mother   . COPD Father        deceased  . Hyperlipidemia Father   . Hypertension Father   . Hypertension Sister   . Cancer Brother 17       Unknown cancer, possibly stomach  . Hypertension Brother   . Cancer Brother 61       Unknown, possibly lung  . Hypertension Daughter   . Hypertension Son   . Colon cancer Neg Hx        pt is unclear about family hx    EXAM: BP (!) 139/56   Pulse 61   Temp 98.3 F (36.8 C)   Resp 17   Ht 5\' 9"  (1.753 m)   Wt 120 kg   SpO2 97%   BMI 39.07 kg/m  CONSTITUTIONAL: Alert and oriented and responds appropriately to questions. Well-appearing; well-nourished elderly, afebrile,  extremely pleasant, in no distress HEAD: Normocephalic EYES: Conjunctivae clear, pupils appear equal, EOM appear intact ENT: normal nose; moist mucous membranes NECK: Supple, normal ROM CARD: RRR; S1 and S2 appreciated; no murmurs, no clicks, no rubs, no gallops RESP: Normal chest excursion without splinting or tachypnea; breath sounds clear and equal bilaterally; no wheezes, no rhonchi, no rales, no hypoxia or respiratory distress, speaking full sentences ABD/GI: Normal bowel sounds;  non-distended; soft, non-tender, no rebound, no guarding, no peritoneal signs, no hepatosplenomegaly BACK:  The back appears normal EXT: Normal ROM in all joints; no deformity noted, no edema; no cyanosis, no calf tenderness or calf swelling SKIN: Normal color for age and race; warm; no rash on exposed skin NEURO: Moves all extremities equally PSYCH: The patient's mood and manner are appropriate.   MEDICAL DECISION MAKING: Patient here with shortness of breath that has now resolved.  Low suspicion that this is his anginal equivalent as he reports that with his previous stents he had chest pain.  No chest pain or chest discomfort at all today.  Chest x-ray clear.  Lungs are clear with good aeration.  No wheezing.  Doubt COPD exacerbation currently.  Labs show creatinine of 1.53 which appears to be slightly elevated from 2020.  Will give gentle IV hydration and have recommended close follow-up with his primary care doctor.  I do not feel this warrants admission.  Electrolytes within normal limits.  He is anemic but this also appears to be chronic.  Hemoglobin today 9.7.  He has had 2 normal high-sensitivity troponins.  Doubt PE.  Doubt dissection.  No signs of pneumonia, pneumothorax or volume overload.  Will ambulate with pulse oximeter.  If he does well and continues to be asymptomatic, anticipate discharge home with outpatient follow-up.  He is comfortable with this plan.  ED PROGRESS: Sats 95 to 100% with ambulation.   Patient still asymptomatic.  Will discharge home with outpatient follow-up.   At this time, I do not feel there is any life-threatening condition present. I have reviewed, interpreted and discussed all results (EKG, imaging, lab, urine as appropriate) and exam findings with patient/family. I have reviewed nursing notes and appropriate previous records.  I feel the patient is safe to be discharged home without further emergent workup and can continue workup as an outpatient as needed. Discussed usual and customary return precautions. Patient/family verbalize understanding and are comfortable with this plan.  Outpatient follow-up has been provided as needed. All questions have been answered.     EKG Interpretation  Date/Time:  Tuesday July 10 2019 01:25:25 EDT Ventricular Rate:  61 PR Interval:  236 QRS Duration: 107 QT Interval:  421 QTC Calculation: 424 R Axis:   42 Text Interpretation: Sinus rhythm Multiple ventricular premature complexes Prolonged PR interval RSR' in V1 or V2, right VCD or RVH No significant change since last tracing Confirmed by Pryor Curia [phone]) on 07/10/2019 2:16:14 AM         [name] was evaluated in Emergency Department on 07/10/2019 for the symptoms described in the history of present illness. He was evaluated in the context of the global COVID-19 pandemic, which necessitated consideration that the patient might be at risk for infection with the SARS-CoV-2 virus that causes COVID-19. Institutional protocols and algorithms that pertain to the evaluation of patients at risk for COVID-19 are in a state of rapid change based on information released by regulatory bodies including the CDC and federal and state organizations. These policies and algorithms were followed during the patient's care in the ED.  Patient was seen wearing N95, face shield, gloves.    Angelissa Supan, Delice Bison, DO 07/10/19 [phone]

## 2019-07-10 NOTE — Discharge Instructions (Addendum)
Your cardiac labs, chest x-ray, EKG today were reassuring.  Your oxygen levels were normal at rest and with walking.  Your lungs were clear.  I recommend close follow-up with your primary care physician.

## 2019-07-10 NOTE — ED Notes (Signed)
Patient verbalizes understanding of discharge instructions. Opportunity for questioning and answers were provided. All questions answered completely. PIV removed, catheter intact. Site dressed with gauze and tape. Armband removed by staff, pt discharged from ED. Wheeled from ED via wheelchair

## 2019-07-12 ENCOUNTER — Telehealth: Payer: Self-pay | Admitting: Internal Medicine

## 2019-07-12 NOTE — Telephone Encounter (Signed)
Since his cough and symptoms have improved today then I recommend that we give it more time. I do not want him taking medication if he does not need it. It is reassuring that he was checked out at the ER also.

## 2019-07-12 NOTE — Telephone Encounter (Signed)
Pt called and states that he went to ER Monday for SOB and they couldn't find anything. Yesterday he had cough all day and all night but got up today and no cough and no SOB or no other symptoms. Pt wanted to know if he could get tested or can you prescribe him something

## 2019-07-12 NOTE — Telephone Encounter (Signed)
Pt was notified and was ok with giving it more time. I advised if more symptoms then he may want to get tested

## 2019-07-18 ENCOUNTER — Other Ambulatory Visit: Payer: Self-pay | Admitting: Cardiology

## 2019-07-18 ENCOUNTER — Telehealth: Payer: Self-pay | Admitting: Cardiovascular Disease

## 2019-07-18 NOTE — Telephone Encounter (Signed)
I spoke to the patient who called because he has been experiencing intermittent CP since Sunday.  He has taken NTG and it relieves it for awhile, but then returns.    He denies any other symptoms and was told that if symptoms worsen, he should go to the ED.  I scheduled an appointment for him on 4/8 with Dr Angelena Form.  He verbalized understanding.

## 2019-07-18 NOTE — Telephone Encounter (Signed)
New message    Pt c/o of Chest Pain: STAT if CP now or developed within 24 hours  1. Are you having CP right now? no  2. Are you experiencing any other symptoms (ex. SOB, nausea, vomiting, sweating)? no  3. How long have you been experiencing CP?  Since the weekend----sunday  4. Is your CP continuous or coming and going? Comes and go  5. Have you taken Nitroglycerin? yes ?

## 2019-07-18 NOTE — Telephone Encounter (Signed)
Rx request sent to pharmacy.  

## 2019-07-19 ENCOUNTER — Encounter: Payer: Self-pay | Admitting: *Deleted

## 2019-07-19 ENCOUNTER — Other Ambulatory Visit: Payer: Self-pay

## 2019-07-19 ENCOUNTER — Encounter: Payer: Self-pay | Admitting: Cardiovascular Disease

## 2019-07-19 ENCOUNTER — Ambulatory Visit: Payer: Medicare HMO | Admitting: Cardiovascular Disease

## 2019-07-19 VITALS — BP 126/64 | HR 61 | Ht 69.0 in | Wt 241.8 lb

## 2019-07-19 DIAGNOSIS — Z01810 Encounter for preprocedural cardiovascular examination: Secondary | ICD-10-CM

## 2019-07-19 DIAGNOSIS — I10 Essential (primary) hypertension: Secondary | ICD-10-CM

## 2019-07-19 DIAGNOSIS — Z01812 Encounter for preprocedural laboratory examination: Secondary | ICD-10-CM | POA: Diagnosis not present

## 2019-07-19 DIAGNOSIS — E782 Mixed hyperlipidemia: Secondary | ICD-10-CM | POA: Diagnosis not present

## 2019-07-19 DIAGNOSIS — I25110 Atherosclerotic heart disease of native coronary artery with unstable angina pectoris: Secondary | ICD-10-CM

## 2019-07-19 NOTE — Patient Instructions (Signed)
Medication Instructions:  No changes today *If you need a refill on your cardiac medications before your next appointment, please call your pharmacy*   Lab Work: On Friday April 16 - BMET, CBC   Testing/Procedures: Your physician has requested that you have a cardiac catheterization. Cardiac catheterization is used to diagnose and/or treat various heart conditions. Doctors may recommend this procedure for a number of different reasons. The most common reason is to evaluate chest pain. Chest pain can be a symptom of coronary artery disease (CAD), and cardiac catheterization can show whether plaque is narrowing or blocking your heart's arteries. This procedure is also used to evaluate the valves, as well as measure the blood flow and oxygen levels in different parts of your heart. For further information please visit [email or web address]. Please follow instruction sheet, as given.   Follow-Up: At [facility], you and your health needs are our priority.  As part of our continuing mission to provide you with exceptional heart care, we have created designated Provider Care Teams.  These Care Teams include your primary Cardiologist (physician) and Advanced Practice Providers (APPs -  Physician Assistants and Nurse Practitioners) who all work together to provide you with the care you need, when you need it.  We recommend signing up for the patient portal called "MyChart".  Sign up information is provided on this After Visit Summary.  MyChart is used to connect with patients for Virtual Visits (Telemedicine).  Patients are able to view lab/test results, encounter notes, upcoming appointments, etc.  Non-urgent messages can be sent to your provider as well.   To learn more about what you can do with MyChart, go to [email or web address].    Your next appointment:   4 week(s)  The format for your next appointment:   In Person  Provider:   You may see one of the following Advanced Practice Providers  on your designated Care Team:    Melina Copa, PA-C  Ermalinda Barrios, PA-C    Other Instructions

## 2019-07-19 NOTE — H&P (View-Only) (Signed)
Chief Complaint  Patient presents with  . Follow-up    CAD     History of Present Illness: 79 yo male with history of HTN, HLD, CAD, PAD, DM who is here today for cardiac follow up. He has been followed in the past by Dr. Lia Foyer. Cardiac cath February 2014  severe OM2 stenosis treated with bare metal stent. Admitted to [facility] 11/09/12 with chest pain. Ruled out for MI. Lexiscan stress myoview 11/16/12 without ischemia. Normal LVEF.  I saw him in the office 07/27/13 and he c/o exertional chest pain c/w unstable angina. Cardiac cath 08/01/13 with patent stent Circumflex and mild to moderate disease in the LAD and RCA. His PAD is followed in [facility] by Dr. Scot Dock. He was seen here 03/04/14 and had c/o chest pain after working on his car but quickly resolved. He was seen in the ED 09/10/14 with atypical chest pain. Troponin was negative x 2 and EKG was unchanged. Multiple ED visits with chest pain in 2017. Nuclear stress test November 2018 with  EF 55% with moderate inferior defect that partially improves consistent with ischemia and possible soft tissue attenuation. Small region of anterior anterior septal ischemia. Diagnosed with prostate cancer in 2019. I saw him in the ED 07/15/17 and he had atypical chest pain with negative troponin and no EKG changes. He was seen in my office 08/01/17 and doing well. Most recently seen in the ED 01/28/18 with atypical chest pain, negative troponin. He was seen in the ED October 2019, April 2020, May 2020 and June 2020 with atypical chest pain.He was seen by telemedicine visit January 2021 and was doing well.   He is here today for follow up. The patient denies any dyspnea, palpitations, lower extremity edema, orthopnea, PND, dizziness, near syncope or syncope. He has had recent episodes of chest pain that responds to NTG. The pain is exertional. Associated dyspnea.   Primary Care Physician:  Girtha Rm, NP-C  Past Medical History:  Diagnosis Date  . Atypical chest  pain   . CAD (coronary artery disease)    a. Multiple prior evaluations then 05/2012 s/p BMS to OM2. b. Nuc 11/2012 wnl. c. Cath 2015: patent stent, moderate LAD/RCA dz, treated medically.  . Cancer ([place])    Hx: of prostate  . Cataract   . CKD (chronic kidney disease), stage II   . COPD (chronic obstructive pulmonary disease) ([place])   . Cough secondary to angiotensin converting enzyme inhibitor (ACE-I)   . Diabetes mellitus without complication ([place])   . Diverticulosis   . DVT (deep venous thrombosis) ([place])   . Family history of breast cancer in first degree relative   . GERD (gastroesophageal reflux disease)   . Headache(784.0)    Hx: of  . History of blood transfusion    "I've had 2; don't know what it was related to" (11/09/2012)  . HLD (hyperlipidemia)   . HTN (hypertension)   . Iron deficiency anemia   . Microcytic anemia   . Peripheral vascular disease ([place])    a. L pop-tibial bypass 2011, followed by [facility].  . Personal history of prostate cancer   . Rotator cuff injury    "left arm; never repaired" (11/09/2012)    Past Surgical History:  Procedure Laterality Date  . CARDIOVASCULAR STRESS TEST  Aug. 2014  . CATARACT EXTRACTION W/ INTRAOCULAR LENS IMPLANT Right ~ 2008  . CIRCUMCISION    . COLONOSCOPY    . CORONARY ANGIOPLASTY WITH STENT PLACEMENT  2014   "  1" (11/09/2012)  . ILIAC ARTERY ANEURYSM REPAIR    . LEFT HEART CATH  05-31-12  . LEFT HEART CATHETERIZATION WITH CORONARY ANGIOGRAM N/A 06/01/2011   Procedure: LEFT HEART CATHETERIZATION WITH CORONARY ANGIOGRAM;  Surgeon: Hillary Bow, MD;  Location: [facility] CATH LAB;  Service: Cardiovascular;  Laterality: N/A;  . LEFT HEART CATHETERIZATION WITH CORONARY ANGIOGRAM N/A 05/31/2012   Procedure: LEFT HEART CATHETERIZATION WITH CORONARY ANGIOGRAM;  Surgeon: Peter M Martinique, MD;  Location: [facility] CATH LAB;  Service: Cardiovascular;  Laterality: N/A;  . LEFT HEART CATHETERIZATION WITH CORONARY ANGIOGRAM N/A 08/01/2013   Procedure: LEFT  HEART CATHETERIZATION WITH CORONARY ANGIOGRAM;  Surgeon: Burnell Blanks, MD;  Location: [facility] CATH LAB;  Service: Cardiovascular;  Laterality: N/A;  . PERCUTANEOUS CORONARY STENT INTERVENTION (PCI-S)  05/31/2012   Procedure: PERCUTANEOUS CORONARY STENT INTERVENTION (PCI-S);  Surgeon: Peter M Martinique, MD;  Location: [facility] CATH LAB;  Service: Cardiovascular;;  . PR VEIN BYPASS GRAFT,AORTO-FEM-POP Left 10/01/2009   left below knee popliteal artery to posterior tibial artery  . SHOULDER ARTHROSCOPY WITH OPEN ROTATOR CUFF REPAIR AND DISTAL CLAVICLE ACROMINECTOMY Left 01/12/2013   Procedure: LEFT SHOULDER ARTHROSCOPY WITH DEBRIDEMENT OPEN DISTAL CLAVICLE RESECTION ,acromioplastyAND ROTATOR CUFF REPAIR;  Surgeon: Yvette Rack., MD;  Location: [facility];  Service: Orthopedics;  Laterality: Left;    Current Outpatient Medications  Medication Sig Dispense Refill  . abiraterone acetate (ZYTIGA) 250 MG tablet Take 1,000 mg by mouth 4 (four) times daily. Take on an empty stomach 1 hour before or 2 hours after a meal    . Accu-Chek FastClix Lancets MISC Test once daily 102 each 12  . albuterol (VENTOLIN HFA) 108 (90 Base) MCG/ACT inhaler INHALE 2 PUFFS INTO THE LUNGS EVERY 6 HOURS AS NEEDED FOR WHEEZING OR SHORTNESS OF BREATH 54 g 0  . amLODipine (NORVASC) 10 MG tablet TAKE 1 TABLET BY MOUTH EVERY DAY 90 tablet 0  . aspirin EC 81 MG tablet Take 81 mg by mouth daily.     . brimonidine (ALPHAGAN) 0.2 % ophthalmic solution Place 1 drop into the right eye 2 (two) times daily.   0  . ferrous sulfate 325 (65 FE) MG tablet Take 325 mg by mouth once a week.     . gabapentin (NEURONTIN) 600 MG tablet Take 600 mg by mouth 2 (two) times daily.     . isosorbide mononitrate (IMDUR) 30 MG 24 hr tablet Take 1 tablet (30 mg total) by mouth daily. 90 tablet 3  . latanoprost (XALATAN) 0.005 % ophthalmic solution Place 1 drop into the right eye at bedtime.    Marland Kitchen loratadine (CLARITIN) 10 MG tablet Take 10 mg by mouth daily.    Marland Kitchen  losartan-hydrochlorothiazide (HYZAAR) 100-25 MG tablet TAKE 1 TABLET BY MOUTH DAILY 90 tablet 0  . nitroGLYCERIN (NITROSTAT) 0.4 MG SL tablet PLACE 1 TABLET UNDER THE TONGUE EVERY 5 MINUTES AS NEEDED FOR CHEST PAIN 25 tablet 4  . omeprazole (PRILOSEC) 20 MG capsule Take 20 mg by mouth 2 (two) times daily before a meal.    . pravastatin (PRAVACHOL) 40 MG tablet Take 1 tablet (40 mg total) by mouth every evening. 90 tablet 3  . predniSONE (DELTASONE) 5 MG tablet Take 5 mg by mouth daily before breakfast.     . traMADol (ULTRAM) 50 MG tablet TAKE 1 TABLET(50 MG) BY MOUTH EVERY 8 HOURS AS NEEDED FOR SEVERE PAIN 30 tablet 0   No current facility-administered medications for this visit.    Allergies  Allergen  Reactions  . Topiramate Shortness Of Breath and Other (See Comments)     leg cramps  . Atenolol Other (See Comments)    bradycardia  . Lisinopril Cough  . Tramadol-Acetaminophen Cough    Tolerates plain Tramadol    Social History   Socioeconomic History  . Marital status: Widowed    Spouse name: Not on file  . Number of children: Not on file  . Years of education: Not on file  . Highest education level: 12th grade  Occupational History  . Occupation: Retired  Tobacco Use  . Smoking status: Former Smoker    Packs/day: 0.50    Years: 52.00    Pack years: 26.00    Types: Cigarettes    Quit date: 05/26/2011    Years since quitting: 8.1  . Smokeless tobacco: Never Used  Substance and Sexual Activity  . Alcohol use: No  . Drug use: No  . Sexual activity: Not on file  Other Topics Concern  . 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  Lives in [place] by himself.      Patient writes with his right hand, though uses his left hand for every thing else. He lives alone in a one level home. He drinks 3 cups of coffee a day and 3 7-8 oz sodas a day. He does not exercise.   Social Determinants of Health   Financial Resource Strain:   .  Difficulty of Paying Living Expenses:   Food Insecurity:   . Worried About Charity fundraiser in the Last Year:   . Arboriculturist in the Last Year:   Transportation Needs:   . Film/video editor (Medical):   Marland Kitchen Lack of Transportation (Non-Medical):   Physical Activity:   . Days of Exercise per Week:   . Minutes of Exercise per Session:   Stress:   . Feeling of Stress :   Social Connections:   . Frequency of Communication with Friends and Family:   . Frequency of Social Gatherings with Friends and Family:   . Attends Religious Services:   . Active Member of Clubs or Organizations:   . Attends Archivist Meetings:   Marland Kitchen Marital Status:   Intimate Partner Violence:   . Fear of Current or Ex-Partner:   . Emotionally Abused:   Marland Kitchen Physically Abused:   . Sexually Abused:     Family History  Problem Relation Age of Onset  . Breast cancer Mother        dx 6s  . Hyperlipidemia Mother   . Hypertension Mother   . COPD Father        deceased  . Hyperlipidemia Father   . Hypertension Father   . Hypertension Sister   . Cancer Brother 67       Unknown cancer, possibly stomach  . Hypertension Brother   . Cancer Brother 58       Unknown, possibly lung  . Hypertension Daughter   . Hypertension Son   . Colon cancer Neg Hx        pt is unclear about family hx    Review of Systems:  As stated in the HPI and otherwise negative.   BP 126/64   Pulse 61   Ht 5\' 9"  (1.753 m)   Wt 241 lb 12.8 oz (109.7 kg)   SpO2 96%   BMI 35.71 kg/m   Physical Examination:  General: Well developed, well nourished,  NAD  HEENT: OP clear, mucus membranes moist  SKIN: warm, dry. No rashes. Neuro: No focal deficits  Musculoskeletal: Muscle strength 5/5 all ext  Psychiatric: Mood and affect normal  Neck: No JVD, no carotid bruits, no thyromegaly, no lymphadenopathy.  Lungs:Clear bilaterally, no wheezes, rhonci, crackles Cardiovascular: Regular rate and rhythm. No murmurs, gallops or  rubs. Abdomen:Soft. Bowel sounds present. Non-tender.  Extremities: No lower extremity edema. Pulses are 2 + in the bilateral DP/PT.  Cardiac cath 08/01/13: Left main: No obstructive disease.  Left Anterior Descending Artery: Large caliber vessel that courses to the apex. The proximal and mid vessel is ectatic. There is diffuse 30-40% stenosis in the mid vessel. There are mild luminal irregularities in the distal vessel.  Circumflex Artery: Large caliber vessel with two obtuse marginal branches. The first OM branch is small in caliber and is chronically occluded in the mid vessel. The second OM branch is patent with patent stent, minimal restenosis. The distal segment of the OM branch has diffuse plaque.  Right Coronary Artery: Large caliber dominant vessel with proximal Shepherd's crook. The proximal vessel has diffuse 40% stenosis. The mid vessel is ectatic with 40% stenosis. The distal vessel has diffuse 50% stenosis. The PDA has 50% proximal stenosis followed by diffuse plaque.  Left Ventricular Angiogram: LVEF=65%.   EKG:  EKG is ordered today. The ekg ordered today demonstrates NSR, rate 61 bpm.   Recent Labs: 02/27/2019: ALT 13 07/09/2019: BUN 19; Creatinine, Ser 1.53; Hemoglobin 9.7; Platelets 240; Potassium 3.7; Sodium 136    Wt Readings from Last 3 Encounters:  07/19/19 241 lb 12.8 oz (109.7 kg)  07/09/19 264 lb 8.8 oz (120 kg)  05/03/19 251 lb (113.9 kg)     Other studies Reviewed: Additional studies/ records that were reviewed today include: . Review of the above records demonstrates:    Assessment and Plan:   1. CAD with unstable angina:Most recent cath 2015 with stable CAD. Nuclear stress test in November 2018 with no large areas of ischemia.He is having chest pain that is concerning for unstable angina. Will plan cardiac cath at [facility] 08/01/19 at 11:30 am.  I have reviewed the risks, indications, and alternatives to cardiac catheterization, possible angioplasty, and  stenting with the patient. Risks include but are not limited to bleeding, infection, vascular injury, stroke, myocardial infection, arrhythmia, kidney injury, radiation-related injury in the case of prolonged fluoroscopy use, emergency cardiac surgery, and death. The patient understands the risks of serious complication is 1-2 in [id number] with diagnostic cardiac cath and 1-2% or less with angioplasty/stenting.  - Continue ASA, statin and Imdur.   2. HTN:BP is controlled. Continue current therapy  3. Hyperlipidemia:Lipids followed in primary care.Continue statin.   Current medicines are reviewed at length with the patient today.  The patient does not have concerns regarding medicines.  The following changes have been made:  no change  Labs/ tests ordered today include:   Orders Placed This Encounter  Procedures  . Basic metabolic panel  . CBC  . EKG 12-Lead    Disposition:   FU after cath in several weeks with me or care team APP  Signed, Lauree Chandler, MD 07/19/2019 2:53 PM    [address], [place], [place]  [address] Phone: [phone]; Fax: [phone]

## 2019-07-19 NOTE — Progress Notes (Signed)
Chief Complaint  Patient presents with  . Follow-up    CAD     History of Present Illness: 79 yo male with history of HTN, HLD, CAD, PAD, DM who is here today for cardiac follow up. He has been followed in the past by Dr. Lia Foyer. Cardiac cath February 2014  severe OM2 stenosis treated with bare metal stent. Admitted to [facility] 11/09/12 with chest pain. Ruled out for MI. Lexiscan stress myoview 11/16/12 without ischemia. Normal LVEF.  I saw him in the office 07/27/13 and he c/o exertional chest pain c/w unstable angina. Cardiac cath 08/01/13 with patent stent Circumflex and mild to moderate disease in the LAD and RCA. His PAD is followed in [facility] by Dr. Scot Dock. He was seen here 03/04/14 and had c/o chest pain after working on his car but quickly resolved. He was seen in the ED 09/10/14 with atypical chest pain. Troponin was negative x 2 and EKG was unchanged. Multiple ED visits with chest pain in 2017. Nuclear stress test November 2018 with  EF 55% with moderate inferior defect that partially improves consistent with ischemia and possible soft tissue attenuation. Small region of anterior anterior septal ischemia. Diagnosed with prostate cancer in 2019. I saw him in the ED 07/15/17 and he had atypical chest pain with negative troponin and no EKG changes. He was seen in my office 08/01/17 and doing well. Most recently seen in the ED 01/28/18 with atypical chest pain, negative troponin. He was seen in the ED October 2019, April 2020, May 2020 and June 2020 with atypical chest pain.He was seen by telemedicine visit January 2021 and was doing well.   He is here today for follow up. The patient denies any dyspnea, palpitations, lower extremity edema, orthopnea, PND, dizziness, near syncope or syncope. He has had recent episodes of chest pain that responds to NTG. The pain is exertional. Associated dyspnea.   Primary Care Physician:  Girtha Rm, NP-C  Past Medical History:  Diagnosis Date  . Atypical chest  pain   . CAD (coronary artery disease)    a. Multiple prior evaluations then 05/2012 s/p BMS to OM2. b. Nuc 11/2012 wnl. c. Cath 2015: patent stent, moderate LAD/RCA dz, treated medically.  . Cancer ([place])    Hx: of prostate  . Cataract   . CKD (chronic kidney disease), stage II   . COPD (chronic obstructive pulmonary disease) ([place])   . Cough secondary to angiotensin converting enzyme inhibitor (ACE-I)   . Diabetes mellitus without complication ([place])   . Diverticulosis   . DVT (deep venous thrombosis) ([place])   . Family history of breast cancer in first degree relative   . GERD (gastroesophageal reflux disease)   . Headache(784.0)    Hx: of  . History of blood transfusion    "I've had 2; don't know what it was related to" (11/09/2012)  . HLD (hyperlipidemia)   . HTN (hypertension)   . Iron deficiency anemia   . Microcytic anemia   . Peripheral vascular disease ([place])    a. L pop-tibial bypass 2011, followed by [facility].  . Personal history of prostate cancer   . Rotator cuff injury    "left arm; never repaired" (11/09/2012)    Past Surgical History:  Procedure Laterality Date  . CARDIOVASCULAR STRESS TEST  Aug. 2014  . CATARACT EXTRACTION W/ INTRAOCULAR LENS IMPLANT Right ~ 2008  . CIRCUMCISION    . COLONOSCOPY    . CORONARY ANGIOPLASTY WITH STENT PLACEMENT  2014   "  1" (11/09/2012)  . ILIAC ARTERY ANEURYSM REPAIR    . LEFT HEART CATH  05-31-12  . LEFT HEART CATHETERIZATION WITH CORONARY ANGIOGRAM N/A 06/01/2011   Procedure: LEFT HEART CATHETERIZATION WITH CORONARY ANGIOGRAM;  Surgeon: Hillary Bow, MD;  Location: [facility] CATH LAB;  Service: Cardiovascular;  Laterality: N/A;  . LEFT HEART CATHETERIZATION WITH CORONARY ANGIOGRAM N/A 05/31/2012   Procedure: LEFT HEART CATHETERIZATION WITH CORONARY ANGIOGRAM;  Surgeon: Peter M Martinique, MD;  Location: [facility] CATH LAB;  Service: Cardiovascular;  Laterality: N/A;  . LEFT HEART CATHETERIZATION WITH CORONARY ANGIOGRAM N/A 08/01/2013   Procedure: LEFT  HEART CATHETERIZATION WITH CORONARY ANGIOGRAM;  Surgeon: Burnell Blanks, MD;  Location: [facility] CATH LAB;  Service: Cardiovascular;  Laterality: N/A;  . PERCUTANEOUS CORONARY STENT INTERVENTION (PCI-S)  05/31/2012   Procedure: PERCUTANEOUS CORONARY STENT INTERVENTION (PCI-S);  Surgeon: Peter M Martinique, MD;  Location: [facility] CATH LAB;  Service: Cardiovascular;;  . PR VEIN BYPASS GRAFT,AORTO-FEM-POP Left 10/01/2009   left below knee popliteal artery to posterior tibial artery  . SHOULDER ARTHROSCOPY WITH OPEN ROTATOR CUFF REPAIR AND DISTAL CLAVICLE ACROMINECTOMY Left 01/12/2013   Procedure: LEFT SHOULDER ARTHROSCOPY WITH DEBRIDEMENT OPEN DISTAL CLAVICLE RESECTION ,acromioplastyAND ROTATOR CUFF REPAIR;  Surgeon: Yvette Rack., MD;  Location: [facility];  Service: Orthopedics;  Laterality: Left;    Current Outpatient Medications  Medication Sig Dispense Refill  . abiraterone acetate (ZYTIGA) 250 MG tablet Take 1,000 mg by mouth 4 (four) times daily. Take on an empty stomach 1 hour before or 2 hours after a meal    . Accu-Chek FastClix Lancets MISC Test once daily 102 each 12  . albuterol (VENTOLIN HFA) 108 (90 Base) MCG/ACT inhaler INHALE 2 PUFFS INTO THE LUNGS EVERY 6 HOURS AS NEEDED FOR WHEEZING OR SHORTNESS OF BREATH 54 g 0  . amLODipine (NORVASC) 10 MG tablet TAKE 1 TABLET BY MOUTH EVERY DAY 90 tablet 0  . aspirin EC 81 MG tablet Take 81 mg by mouth daily.     . brimonidine (ALPHAGAN) 0.2 % ophthalmic solution Place 1 drop into the right eye 2 (two) times daily.   0  . ferrous sulfate 325 (65 FE) MG tablet Take 325 mg by mouth once a week.     . gabapentin (NEURONTIN) 600 MG tablet Take 600 mg by mouth 2 (two) times daily.     . isosorbide mononitrate (IMDUR) 30 MG 24 hr tablet Take 1 tablet (30 mg total) by mouth daily. 90 tablet 3  . latanoprost (XALATAN) 0.005 % ophthalmic solution Place 1 drop into the right eye at bedtime.    Marland Kitchen loratadine (CLARITIN) 10 MG tablet Take 10 mg by mouth daily.    Marland Kitchen  losartan-hydrochlorothiazide (HYZAAR) 100-25 MG tablet TAKE 1 TABLET BY MOUTH DAILY 90 tablet 0  . nitroGLYCERIN (NITROSTAT) 0.4 MG SL tablet PLACE 1 TABLET UNDER THE TONGUE EVERY 5 MINUTES AS NEEDED FOR CHEST PAIN 25 tablet 4  . omeprazole (PRILOSEC) 20 MG capsule Take 20 mg by mouth 2 (two) times daily before a meal.    . pravastatin (PRAVACHOL) 40 MG tablet Take 1 tablet (40 mg total) by mouth every evening. 90 tablet 3  . predniSONE (DELTASONE) 5 MG tablet Take 5 mg by mouth daily before breakfast.     . traMADol (ULTRAM) 50 MG tablet TAKE 1 TABLET(50 MG) BY MOUTH EVERY 8 HOURS AS NEEDED FOR SEVERE PAIN 30 tablet 0   No current facility-administered medications for this visit.    Allergies  Allergen  Reactions  . Topiramate Shortness Of Breath and Other (See Comments)     leg cramps  . Atenolol Other (See Comments)    bradycardia  . Lisinopril Cough  . Tramadol-Acetaminophen Cough    Tolerates plain Tramadol    Social History   Socioeconomic History  . Marital status: Widowed    Spouse name: Not on file  . Number of children: Not on file  . Years of education: Not on file  . Highest education level: 12th grade  Occupational History  . Occupation: Retired  Tobacco Use  . Smoking status: Former Smoker    Packs/day: 0.50    Years: 52.00    Pack years: 26.00    Types: Cigarettes    Quit date: 05/26/2011    Years since quitting: 8.1  . Smokeless tobacco: Never Used  Substance and Sexual Activity  . Alcohol use: No  . Drug use: No  . Sexual activity: Not on file  Other Topics Concern  . 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  Lives in [place] by himself.      Patient writes with his right hand, though uses his left hand for every thing else. He lives alone in a one level home. He drinks 3 cups of coffee a day and 3 7-8 oz sodas a day. He does not exercise.   Social Determinants of Health   Financial Resource Strain:   .  Difficulty of Paying Living Expenses:   Food Insecurity:   . Worried About Charity fundraiser in the Last Year:   . Arboriculturist in the Last Year:   Transportation Needs:   . Film/video editor (Medical):   Marland Kitchen Lack of Transportation (Non-Medical):   Physical Activity:   . Days of Exercise per Week:   . Minutes of Exercise per Session:   Stress:   . Feeling of Stress :   Social Connections:   . Frequency of Communication with Friends and Family:   . Frequency of Social Gatherings with Friends and Family:   . Attends Religious Services:   . Active Member of Clubs or Organizations:   . Attends Archivist Meetings:   Marland Kitchen Marital Status:   Intimate Partner Violence:   . Fear of Current or Ex-Partner:   . Emotionally Abused:   Marland Kitchen Physically Abused:   . Sexually Abused:     Family History  Problem Relation Age of Onset  . Breast cancer Mother        dx 59s  . Hyperlipidemia Mother   . Hypertension Mother   . COPD Father        deceased  . Hyperlipidemia Father   . Hypertension Father   . Hypertension Sister   . Cancer Brother 80       Unknown cancer, possibly stomach  . Hypertension Brother   . Cancer Brother 26       Unknown, possibly lung  . Hypertension Daughter   . Hypertension Son   . Colon cancer Neg Hx        pt is unclear about family hx    Review of Systems:  As stated in the HPI and otherwise negative.   BP 126/64   Pulse 61   Ht 5\' 9"  (1.753 m)   Wt 241 lb 12.8 oz (109.7 kg)   SpO2 96%   BMI 35.71 kg/m   Physical Examination:  General: Well developed, well nourished,  NAD  HEENT: OP clear, mucus membranes moist  SKIN: warm, dry. No rashes. Neuro: No focal deficits  Musculoskeletal: Muscle strength 5/5 all ext  Psychiatric: Mood and affect normal  Neck: No JVD, no carotid bruits, no thyromegaly, no lymphadenopathy.  Lungs:Clear bilaterally, no wheezes, rhonci, crackles Cardiovascular: Regular rate and rhythm. No murmurs, gallops or  rubs. Abdomen:Soft. Bowel sounds present. Non-tender.  Extremities: No lower extremity edema. Pulses are 2 + in the bilateral DP/PT.  Cardiac cath 08/01/13: Left main: No obstructive disease.  Left Anterior Descending Artery: Large caliber vessel that courses to the apex. The proximal and mid vessel is ectatic. There is diffuse 30-40% stenosis in the mid vessel. There are mild luminal irregularities in the distal vessel.  Circumflex Artery: Large caliber vessel with two obtuse marginal branches. The first OM branch is small in caliber and is chronically occluded in the mid vessel. The second OM branch is patent with patent stent, minimal restenosis. The distal segment of the OM branch has diffuse plaque.  Right Coronary Artery: Large caliber dominant vessel with proximal Shepherd's crook. The proximal vessel has diffuse 40% stenosis. The mid vessel is ectatic with 40% stenosis. The distal vessel has diffuse 50% stenosis. The PDA has 50% proximal stenosis followed by diffuse plaque.  Left Ventricular Angiogram: LVEF=65%.   EKG:  EKG is ordered today. The ekg ordered today demonstrates NSR, rate 61 bpm.   Recent Labs: 02/27/2019: ALT 13 07/09/2019: BUN 19; Creatinine, Ser 1.53; Hemoglobin 9.7; Platelets 240; Potassium 3.7; Sodium 136    Wt Readings from Last 3 Encounters:  07/19/19 241 lb 12.8 oz (109.7 kg)  07/09/19 264 lb 8.8 oz (120 kg)  05/03/19 251 lb (113.9 kg)     Other studies Reviewed: Additional studies/ records that were reviewed today include: . Review of the above records demonstrates:    Assessment and Plan:   1. CAD with unstable angina:Most recent cath 2015 with stable CAD. Nuclear stress test in November 2018 with no large areas of ischemia.He is having chest pain that is concerning for unstable angina. Will plan cardiac cath at [facility] 08/01/19 at 11:30 am.  I have reviewed the risks, indications, and alternatives to cardiac catheterization, possible angioplasty, and  stenting with the patient. Risks include but are not limited to bleeding, infection, vascular injury, stroke, myocardial infection, arrhythmia, kidney injury, radiation-related injury in the case of prolonged fluoroscopy use, emergency cardiac surgery, and death. The patient understands the risks of serious complication is 1-2 in [id number] with diagnostic cardiac cath and 1-2% or less with angioplasty/stenting.  - Continue ASA, statin and Imdur.   2. HTN:BP is controlled. Continue current therapy  3. Hyperlipidemia:Lipids followed in primary care.Continue statin.   Current medicines are reviewed at length with the patient today.  The patient does not have concerns regarding medicines.  The following changes have been made:  no change  Labs/ tests ordered today include:   Orders Placed This Encounter  Procedures  . Basic metabolic panel  . CBC  . EKG 12-Lead    Disposition:   FU after cath in several weeks with me or care team APP  Signed, Lauree Chandler, MD 07/19/2019 2:53 PM    [address], [place], [place]  [address] Phone: [phone]; Fax: [phone]

## 2019-07-23 ENCOUNTER — Telehealth: Payer: Self-pay | Admitting: Cardiovascular Disease

## 2019-07-23 NOTE — Telephone Encounter (Signed)
Patient would like to speak with Dr. Camillia Herter nurse to confirm dates for an upcoming procedure

## 2019-07-23 NOTE — Telephone Encounter (Signed)
Spoke with patient. He is aware of dates.  Lab 4/16, covid screen 4/19, cath 4/21.

## 2019-07-27 ENCOUNTER — Other Ambulatory Visit: Payer: Medicare HMO | Admitting: *Deleted

## 2019-07-27 ENCOUNTER — Other Ambulatory Visit: Payer: Self-pay

## 2019-07-27 DIAGNOSIS — I25110 Atherosclerotic heart disease of native coronary artery with unstable angina pectoris: Secondary | ICD-10-CM

## 2019-07-27 DIAGNOSIS — Z01812 Encounter for preprocedural laboratory examination: Secondary | ICD-10-CM | POA: Diagnosis not present

## 2019-07-27 DIAGNOSIS — E782 Mixed hyperlipidemia: Secondary | ICD-10-CM

## 2019-07-27 DIAGNOSIS — I10 Essential (primary) hypertension: Secondary | ICD-10-CM

## 2019-07-27 LAB — BASIC METABOLIC PANEL
BUN/Creatinine Ratio: 13 (ref 10–24)
BUN: 16 mg/dL (ref 8–27)
CO2: 26 mmol/L (ref 20–29)
Calcium: 9.5 mg/dL (ref 8.6–10.2)
Chloride: 99 mmol/L (ref 96–106)
Creatinine, Ser: 1.21 mg/dL (ref 0.76–1.27)
GFR calc Af Amer: 65 mL/min/{1.73_m2} (ref >59)
GFR calc non Af Amer: 57 mL/min/{1.73_m2} — ABNORMAL LOW (ref >59)
Glucose: 146 mg/dL — ABNORMAL HIGH (ref 65–99)
Potassium: 4 mmol/L (ref 3.5–5.2)
Sodium: 136 mmol/L (ref 134–144)

## 2019-07-27 LAB — CBC
Hematocrit: 32.9 % — ABNORMAL LOW (ref 37.5–51.0)
Hemoglobin: 10.3 g/dL — ABNORMAL LOW (ref 13.0–17.7)
MCH: 20.4 pg — ABNORMAL LOW (ref 26.6–33.0)
MCHC: 31.3 g/dL — ABNORMAL LOW (ref 31.5–35.7)
MCV: 65 fL — ABNORMAL LOW (ref 79–97)
Platelets: 214 10*3/uL (ref 150–450)
RBC: 5.04 x10E6/uL (ref 4.14–5.80)
RDW: 20.7 % — ABNORMAL HIGH (ref 11.6–15.4)
WBC: 7.4 10*3/uL (ref 3.4–10.8)

## 2019-07-30 ENCOUNTER — Other Ambulatory Visit (HOSPITAL_COMMUNITY)
Admission: RE | Admit: 2019-07-30 | Discharge: 2019-07-30 | Disposition: A | Discharge status: Home / Self Care | Payer: Medicare HMO | Source: Ambulatory Visit | Attending: Cardiovascular Disease | Admitting: Cardiovascular Disease

## 2019-07-30 ENCOUNTER — Telehealth: Payer: Self-pay | Admitting: *Deleted

## 2019-07-30 DIAGNOSIS — Z20822 Contact with and (suspected) exposure to covid-19: Secondary | ICD-10-CM | POA: Insufficient documentation

## 2019-07-30 DIAGNOSIS — Z01812 Encounter for preprocedural laboratory examination: Secondary | ICD-10-CM | POA: Diagnosis not present

## 2019-07-30 LAB — SARS CORONAVIRUS 2 (TAT 6-24 HRS): SARS Coronavirus 2: NEGATIVE

## 2019-07-30 NOTE — Telephone Encounter (Signed)
Pt contacted pre-catheterization scheduled at [facility] for: Wednesday August 01, 2019 11:30 AM Verified arrival time and place: [facility]) at: 9:30 AM   No solid food after midnight prior to cath, clear liquids until 5 AM day of procedure.  Hold: Losartan-HCT-AM of procedure.  Except hold medications AM meds can be  taken pre-cath with sip of water including: ASA 81 mg   Confirmed patient has responsible adult to drive home post procedure and observe 24 hours after arriving home: yes  Currently, due to Covid-19 pandemic, only one person will be allowed with patient. Must be the same person for patient's entire stay and will be required to wear a mask. They will be asked to wait in the waiting room for the duration of the patient's stay.  Patients are required to wear a mask when they enter the hospital.      COVID-19 Pre-Screening Questions:  . In the past 7 to 10 days have you had a cough,  shortness of breath, headache, congestion, fever (100 or greater) body aches, chills, sore throat, or sudden loss of taste or sense of smell? no . Have you been around anyone with known Covid 19 in the past 7 to 10 days? no . Have you been around anyone who is awaiting Covid 19 test results in the past 7 to 10 days? no . Have you been around anyone who  has mentioned symptoms of Covid 19 within the past 7 to 10 days? No  Reviewed procedure/mask/visitor instructions, COVID-19 screening questions with patient.

## 2019-08-01 ENCOUNTER — Ambulatory Visit (HOSPITAL_COMMUNITY)
Admission: RE | Admit: 2019-08-01 | Discharge: 2019-08-01 | Disposition: A | Discharge status: Home / Self Care | Payer: Medicare HMO | Attending: Cardiovascular Disease | Admitting: Cardiovascular Disease

## 2019-08-01 ENCOUNTER — Other Ambulatory Visit: Payer: Self-pay

## 2019-08-01 ENCOUNTER — Encounter (HOSPITAL_COMMUNITY)
Admission: RE | Disposition: A | Discharge status: Home / Self Care | Payer: Self-pay | Source: Home / Self Care | Attending: Cardiovascular Disease

## 2019-08-01 DIAGNOSIS — E1151 Type 2 diabetes mellitus with diabetic peripheral angiopathy without gangrene: Secondary | ICD-10-CM | POA: Insufficient documentation

## 2019-08-01 DIAGNOSIS — I739 Peripheral vascular disease, unspecified: Secondary | ICD-10-CM | POA: Insufficient documentation

## 2019-08-01 DIAGNOSIS — E1136 Type 2 diabetes mellitus with diabetic cataract: Secondary | ICD-10-CM | POA: Insufficient documentation

## 2019-08-01 DIAGNOSIS — D509 Iron deficiency anemia, unspecified: Secondary | ICD-10-CM | POA: Diagnosis not present

## 2019-08-01 DIAGNOSIS — Z8249 Family history of ischemic heart disease and other diseases of the circulatory system: Secondary | ICD-10-CM | POA: Diagnosis not present

## 2019-08-01 DIAGNOSIS — Z955 Presence of coronary angioplasty implant and graft: Secondary | ICD-10-CM | POA: Diagnosis not present

## 2019-08-01 DIAGNOSIS — R079 Chest pain, unspecified: Secondary | ICD-10-CM

## 2019-08-01 DIAGNOSIS — Z79899 Other long term (current) drug therapy: Secondary | ICD-10-CM | POA: Diagnosis not present

## 2019-08-01 DIAGNOSIS — E1122 Type 2 diabetes mellitus with diabetic chronic kidney disease: Secondary | ICD-10-CM | POA: Diagnosis not present

## 2019-08-01 DIAGNOSIS — Z86718 Personal history of other venous thrombosis and embolism: Secondary | ICD-10-CM | POA: Diagnosis not present

## 2019-08-01 DIAGNOSIS — I2510 Atherosclerotic heart disease of native coronary artery without angina pectoris: Secondary | ICD-10-CM

## 2019-08-01 DIAGNOSIS — I129 Hypertensive chronic kidney disease with stage 1 through stage 4 chronic kidney disease, or unspecified chronic kidney disease: Secondary | ICD-10-CM | POA: Diagnosis not present

## 2019-08-01 DIAGNOSIS — K219 Gastro-esophageal reflux disease without esophagitis: Secondary | ICD-10-CM | POA: Insufficient documentation

## 2019-08-01 DIAGNOSIS — E785 Hyperlipidemia, unspecified: Secondary | ICD-10-CM | POA: Diagnosis not present

## 2019-08-01 DIAGNOSIS — Z87891 Personal history of nicotine dependence: Secondary | ICD-10-CM | POA: Diagnosis not present

## 2019-08-01 DIAGNOSIS — Z885 Allergy status to narcotic agent status: Secondary | ICD-10-CM | POA: Insufficient documentation

## 2019-08-01 DIAGNOSIS — J449 Chronic obstructive pulmonary disease, unspecified: Secondary | ICD-10-CM | POA: Insufficient documentation

## 2019-08-01 DIAGNOSIS — N182 Chronic kidney disease, stage 2 (mild): Secondary | ICD-10-CM | POA: Insufficient documentation

## 2019-08-01 DIAGNOSIS — Z888 Allergy status to other drugs, medicaments and biological substances status: Secondary | ICD-10-CM | POA: Diagnosis not present

## 2019-08-01 DIAGNOSIS — Z7982 Long term (current) use of aspirin: Secondary | ICD-10-CM | POA: Diagnosis not present

## 2019-08-01 DIAGNOSIS — Z7952 Long term (current) use of systemic steroids: Secondary | ICD-10-CM | POA: Diagnosis not present

## 2019-08-01 DIAGNOSIS — I25110 Atherosclerotic heart disease of native coronary artery with unstable angina pectoris: Secondary | ICD-10-CM

## 2019-08-01 HISTORY — PX: LEFT HEART CATH AND CORONARY ANGIOGRAPHY: CATH118249

## 2019-08-01 LAB — GLUCOSE, CAPILLARY: Glucose-Capillary: 126 mg/dL — ABNORMAL HIGH (ref 70–99)

## 2019-08-01 SURGERY — LEFT HEART CATH AND CORONARY ANGIOGRAPHY
Anesthesia: LOCAL

## 2019-08-01 MED ORDER — SODIUM CHLORIDE 0.9% FLUSH: 3.0000 mL | INTRAVENOUS | Status: DC | PRN

## 2019-08-01 MED ORDER — SODIUM CHLORIDE 0.9% FLUSH: 3.0000 mL | Freq: Two times a day (BID) | INTRAVENOUS | Status: DC

## 2019-08-01 MED ORDER — LABETALOL HCL 5 MG/ML IV SOLN: 10.0000 mg | INTRAVENOUS | Status: DC | PRN

## 2019-08-01 MED ORDER — HEPARIN SODIUM (PORCINE) 1000 UNIT/ML IJ SOLN
INTRAMUSCULAR | Status: DC | PRN
  Administered 2019-08-01: 6000 [IU] via INTRAVENOUS

## 2019-08-01 MED ORDER — ONDANSETRON HCL 4 MG/2ML IJ SOLN: 4.0000 mg | Freq: Four times a day (QID) | INTRAMUSCULAR | Status: DC | PRN

## 2019-08-01 MED ORDER — LIDOCAINE HCL (PF) 1 % IJ SOLN
INTRAMUSCULAR | Status: AC
  Filled 2019-08-01: qty 30

## 2019-08-01 MED ORDER — ASPIRIN 81 MG PO CHEW: 81.0000 mg | CHEWABLE_TABLET | ORAL | Status: DC

## 2019-08-01 MED ORDER — VERAPAMIL HCL 2.5 MG/ML IV SOLN
INTRAVENOUS | Status: AC
  Filled 2019-08-01: qty 2

## 2019-08-01 MED ORDER — SODIUM CHLORIDE 0.9 % IV SOLN: 250.0000 mL | INTRAVENOUS | Status: DC | PRN

## 2019-08-01 MED ORDER — SODIUM CHLORIDE 0.9 % IV SOLN: INTRAVENOUS | Status: DC

## 2019-08-01 MED ORDER — HEPARIN (PORCINE) IN NACL 1000-0.9 UT/500ML-% IV SOLN
INTRAVENOUS | Status: DC | PRN
  Administered 2019-08-01 (×2): 500 mL

## 2019-08-01 MED ORDER — SODIUM CHLORIDE 0.9 % WEIGHT BASED INFUSION
3.0000 mL/kg/h | INTRAVENOUS | Status: AC
  Administered 2019-08-01: 3 mL/kg/h via INTRAVENOUS

## 2019-08-01 MED ORDER — HYDRALAZINE HCL 20 MG/ML IJ SOLN: 10.0000 mg | INTRAMUSCULAR | Status: DC | PRN

## 2019-08-01 MED ORDER — HEPARIN SODIUM (PORCINE) 1000 UNIT/ML IJ SOLN
INTRAMUSCULAR | Status: AC
  Filled 2019-08-01: qty 1

## 2019-08-01 MED ORDER — VERAPAMIL HCL 2.5 MG/ML IV SOLN
INTRAVENOUS | Status: DC | PRN
  Administered 2019-08-01: 10 mL via INTRA_ARTERIAL

## 2019-08-01 MED ORDER — MIDAZOLAM HCL 2 MG/2ML IJ SOLN
INTRAMUSCULAR | Status: DC | PRN
  Administered 2019-08-01: 2 mg via INTRAVENOUS

## 2019-08-01 MED ORDER — LIDOCAINE HCL (PF) 1 % IJ SOLN
INTRAMUSCULAR | Status: DC | PRN
  Administered 2019-08-01: 2 mL

## 2019-08-01 MED ORDER — FENTANYL CITRATE (PF) 100 MCG/2ML IJ SOLN
INTRAMUSCULAR | Status: AC
  Filled 2019-08-01: qty 2

## 2019-08-01 MED ORDER — MIDAZOLAM HCL 2 MG/2ML IJ SOLN
INTRAMUSCULAR | Status: AC
  Filled 2019-08-01: qty 2

## 2019-08-01 MED ORDER — IOHEXOL 350 MG/ML SOLN
INTRAVENOUS | Status: DC | PRN
  Administered 2019-08-01: 90 mL

## 2019-08-01 MED ORDER — HEPARIN (PORCINE) IN NACL 1000-0.9 UT/500ML-% IV SOLN
INTRAVENOUS | Status: AC
  Filled 2019-08-01: qty 1000

## 2019-08-01 MED ORDER — SODIUM CHLORIDE 0.9 % WEIGHT BASED INFUSION
1.0000 mL/kg/h | INTRAVENOUS | Status: DC
  Administered 2019-08-01: 1 mL/kg/h via INTRAVENOUS

## 2019-08-01 MED ORDER — FENTANYL CITRATE (PF) 100 MCG/2ML IJ SOLN
INTRAMUSCULAR | Status: DC | PRN
  Administered 2019-08-01: 50 ug via INTRAVENOUS

## 2019-08-01 SURGICAL SUPPLY — 11 items
CATH 5FR JL3.5 JR4 ANG PIG MP (CATHETERS) ×2 IMPLANT
CATH LAUNCHER 5F EBU4.0 (CATHETERS) ×2 IMPLANT
GLIDESHEATH SLEND SS 6F .021 (SHEATH) ×2 IMPLANT
GUIDEWIRE INQWIRE 1.5J.035X260 (WIRE) ×1 IMPLANT
INQWIRE 1.5J .035X260CM (WIRE) ×2
KIT HEART LEFT (KITS) ×2 IMPLANT
PACK CARDIAC CATHETERIZATION (CUSTOM PROCEDURE TRAY) ×2 IMPLANT
SYR MEDRAD MARK 7 150ML (SYRINGE) ×2 IMPLANT
TRANSDUCER W/STOPCOCK (MISCELLANEOUS) ×2 IMPLANT
TUBING CIL FLEX 10 FLL-RA (TUBING) ×2 IMPLANT
WIRE HI TORQ VERSACORE-J 145CM (WIRE) ×2 IMPLANT

## 2019-08-01 NOTE — Interval H&P Note (Signed)
History and Physical Interval Note:  08/01/2019 12:11 PM  Joel [name]  has presented today for surgery, with the diagnosis of Chest pain.  The various methods of treatment have been discussed with the patient and family. After consideration of risks, benefits and other options for treatment, the patient has consented to  Procedure(s): LEFT HEART CATH AND CORONARY ANGIOGRAPHY (N/A) as a surgical intervention.  The patient's history has been reviewed, patient examined, no change in status, stable for surgery.  I have reviewed the patient's chart and labs.  Questions were answered to the patient's satisfaction.    Cath Lab Visit (complete for each Cath Lab visit)  Clinical Evaluation Leading to the Procedure:   ACS: No.  Non-ACS:    Anginal Classification: CCS III  Anti-ischemic medical therapy: Maximal Therapy (2 or more classes of medications)  Non-Invasive Test Results: No non-invasive testing performed  Prior CABG: No previous CABG        Lauree Chandler

## 2019-08-01 NOTE — Discharge Instructions (Signed)
Drink plenty of fluid for 48 hours and keep wrist elevated at heart level for 24 hours  Radial Site Care   This sheet gives you information about how to care for yourself after your procedure. Your health care provider may also give you more specific instructions. If you have problems or questions, contact your health care provider. What can I expect after the procedure? After the procedure, it is common to have:  Bruising and tenderness at the catheter insertion area. Follow these instructions at home: Medicines  Take over-the-counter and prescription medicines only as told by your health care provider. Insertion site care 1. Follow instructions from your health care provider about how to take care of your insertion site. Make sure you: ? Wash your hands with soap and water before you change your bandage (dressing). If soap and water are not available, use hand sanitizer. ? remove your dressing as told by your health care provider. In 24 hours 2. Check your insertion site every day for signs of infection. Check for: ? Redness, swelling, or pain. ? Fluid or blood. ? Pus or a bad smell. ? Warmth. 3. Do not take baths, swim, or use a hot tub until your health care provider approves. 4. You may shower 24-48 hours after the procedure, or as directed by your health care provider. ? Remove the dressing and gently wash the site with plain soap and water. ? Pat the area dry with a clean towel. ? Do not rub the site. That could cause bleeding. 5. Do not apply powder or lotion to the site. Activity   1. For 24 hours after the procedure, or as directed by your health care provider: ? Do not flex or bend the affected arm. ? Do not push or pull heavy objects with the affected arm. ? Do not drive yourself home from the hospital or clinic. You may drive 24 hours after the procedure unless your health care provider tells you not to. ? Do not operate machinery or power tools. 2. Do not lift  anything that is heavier than 10 lb (4.5 kg), or the limit that you are told, until your health care provider says that it is safe. For 4 days 3. Ask your health care provider when it is okay to: ? Return to work or school. ? Resume usual physical activities or sports. ? Resume sexual activity. General instructions  If the catheter site starts to bleed, raise your arm and put firm pressure on the site. If the bleeding does not stop, get help right away. This is a medical emergency.  If you went home on the same day as your procedure, a responsible adult should be with you for the first 24 hours after you arrive home.  Keep all follow-up visits as told by your health care provider. This is important. Contact a health care provider if:  You have a fever.  You have redness, swelling, or yellow drainage around your insertion site. Get help right away if:  You have unusual pain at the radial site.  The catheter insertion area swells very fast.  The insertion area is bleeding, and the bleeding does not stop when you hold steady pressure on the area.  Your arm or hand becomes pale, cool, tingly, or numb. These symptoms may represent a serious problem that is an emergency. Do not wait to see if the symptoms will go away. Get medical help right away. Call your local emergency services (911 in the [place] Do not   drive yourself to the hospital. Summary  After the procedure, it is common to have bruising and tenderness at the site.  Follow instructions from your health care provider about how to take care of your radial site wound. Check the wound every day for signs of infection.  Do not lift anything that is heavier than 10 lb (4.5 kg), or the limit that you are told, until your health care provider says that it is safe. This information is not intended to replace advice given to you by your health care provider. Make sure you discuss any questions you have with your health care  provider. Document Revised: 05/04/2017 Document Reviewed: 05/04/2017 Elsevier Patient Education  2020 Elsevier Inc.  

## 2019-08-01 NOTE — Progress Notes (Signed)
Patient and daughter was given discharge instructions. Both verbalized understanding. 

## 2019-08-03 ENCOUNTER — Other Ambulatory Visit: Payer: Self-pay | Admitting: Internal Medicine

## 2019-08-03 DIAGNOSIS — G8929 Other chronic pain: Secondary | ICD-10-CM

## 2019-08-03 DIAGNOSIS — M545 Low back pain, unspecified: Secondary | ICD-10-CM

## 2019-08-03 MED ORDER — TRAMADOL HCL 50 MG PO TABS: ORAL_TABLET | ORAL | 0 refills | Status: DC

## 2019-08-03 NOTE — Telephone Encounter (Signed)
I am ok with this since his cardiologist said his chest pain was not cardiac related and his recent heart cath was clean.

## 2019-08-03 NOTE — Telephone Encounter (Signed)
Pt still having some chest pain and needs a refill on tramadol

## 2019-08-03 NOTE — Telephone Encounter (Signed)
ok 

## 2019-08-13 ENCOUNTER — Encounter: Payer: Self-pay | Admitting: Family Medicine

## 2019-08-13 ENCOUNTER — Other Ambulatory Visit: Payer: Self-pay

## 2019-08-13 ENCOUNTER — Telehealth (INDEPENDENT_AMBULATORY_CARE_PROVIDER_SITE_OTHER): Payer: Medicare HMO | Admitting: Family Medicine

## 2019-08-13 VITALS — BP 116/63 | HR 68 | Wt 242.0 lb

## 2019-08-13 DIAGNOSIS — R059 Cough, unspecified: Secondary | ICD-10-CM

## 2019-08-13 DIAGNOSIS — J3489 Other specified disorders of nose and nasal sinuses: Secondary | ICD-10-CM | POA: Diagnosis not present

## 2019-08-13 DIAGNOSIS — R05 Cough: Secondary | ICD-10-CM

## 2019-08-13 NOTE — Progress Notes (Signed)
   Subjective:  Documentation for virtual audio and video telecommunications through [place] encounter:  The patient was located at home. 2 patient identifiers used.  The provider was located in the office. The patient did consent to this visit and is aware of possible charges through their insurance for this visit.  The other persons participating in this telemedicine service were none. Time spent on call was 12 minutes and in review of previous records 15 minutes total.  This virtual service is not related to other E/M service within previous 7 days.   [name], male    DOB: July 13, 1940, 79 y.o.   MRN: [id number]  HPI Chief Complaint  Patient presents with  . coughing    coughing comes and goes- white/gray stuff, running nose, and tired. about a week ago   Complains of a one week history of resolving cough which is occasionally productive of clear and gray sputum, rhinorrhea. He was having fatigue but states his energy level is back to baseline.  No fever, chills, body aches, headache, dizziness, chest pain, palpitations, shortness of breath, abdominal pain, N/V/D, urinary symptoms, LE edema.  No loss of taste or smell.   States he has been drinking energy drinks.   States he had a procedure done in his back and his back is hurting him again.   States he received the Covid vaccines, Stacy, at the [place]. His doses 05/16/2019, 06/13/2019.     Review of Systems Pertinent positives and negatives in the history of present illness.     Objective:   Physical Exam BP 116/63   Pulse 68   Wt 242 lb (109.8 kg)   BMI 35.74 kg/m   Alert and oriented and in no distress. Respirations unlabored. No cough. Normal speech, mood and thought process.       Assessment & Plan:  Cough  Rhinorrhea  Symptoms are basically resolved. Discussed symptomatic treatment. Avoid energy drinks.  He received his Covid vaccines at the [place].  Follow up if worsening or not back to  baseline.

## 2019-08-31 DIAGNOSIS — H35033 Hypertensive retinopathy, bilateral: Secondary | ICD-10-CM | POA: Diagnosis not present

## 2019-08-31 DIAGNOSIS — H04123 Dry eye syndrome of bilateral lacrimal glands: Secondary | ICD-10-CM | POA: Diagnosis not present

## 2019-08-31 DIAGNOSIS — H0102A Squamous blepharitis right eye, upper and lower eyelids: Secondary | ICD-10-CM | POA: Diagnosis not present

## 2019-08-31 DIAGNOSIS — H47231 Glaucomatous optic atrophy, right eye: Secondary | ICD-10-CM | POA: Diagnosis not present

## 2019-08-31 DIAGNOSIS — H0102B Squamous blepharitis left eye, upper and lower eyelids: Secondary | ICD-10-CM | POA: Diagnosis not present

## 2019-08-31 DIAGNOSIS — H11153 Pinguecula, bilateral: Secondary | ICD-10-CM | POA: Diagnosis not present

## 2019-08-31 DIAGNOSIS — H43811 Vitreous degeneration, right eye: Secondary | ICD-10-CM | POA: Diagnosis not present

## 2019-08-31 DIAGNOSIS — H18413 Arcus senilis, bilateral: Secondary | ICD-10-CM | POA: Diagnosis not present

## 2019-08-31 DIAGNOSIS — H401230 Low-tension glaucoma, bilateral, stage unspecified: Secondary | ICD-10-CM | POA: Diagnosis not present

## 2019-09-03 ENCOUNTER — Other Ambulatory Visit: Payer: Self-pay | Admitting: Family Medicine

## 2019-09-05 DIAGNOSIS — M4326 Fusion of spine, lumbar region: Secondary | ICD-10-CM | POA: Diagnosis not present

## 2019-09-05 DIAGNOSIS — M545 Low back pain: Secondary | ICD-10-CM | POA: Diagnosis not present

## 2019-09-05 DIAGNOSIS — M4716 Other spondylosis with myelopathy, lumbar region: Secondary | ICD-10-CM | POA: Diagnosis not present

## 2019-09-05 DIAGNOSIS — M7918 Myalgia, other site: Secondary | ICD-10-CM | POA: Diagnosis not present

## 2019-09-06 ENCOUNTER — Other Ambulatory Visit: Payer: Self-pay | Admitting: Family Medicine

## 2019-09-06 DIAGNOSIS — C61 Malignant neoplasm of prostate: Secondary | ICD-10-CM | POA: Insufficient documentation

## 2019-09-07 ENCOUNTER — Telehealth: Payer: Self-pay | Admitting: *Deleted

## 2019-09-07 DIAGNOSIS — D3162 Benign neoplasm of unspecified site of left orbit: Secondary | ICD-10-CM | POA: Diagnosis not present

## 2019-09-07 DIAGNOSIS — H05222 Edema of left orbit: Secondary | ICD-10-CM | POA: Diagnosis not present

## 2019-09-07 DIAGNOSIS — E119 Type 2 diabetes mellitus without complications: Secondary | ICD-10-CM

## 2019-09-07 DIAGNOSIS — H04012 Acute dacryoadenitis, left lacrimal gland: Secondary | ICD-10-CM | POA: Diagnosis not present

## 2019-09-07 DIAGNOSIS — H04162 Lacrimal gland dislocation, left lacrimal gland: Secondary | ICD-10-CM | POA: Diagnosis not present

## 2019-09-07 DIAGNOSIS — H02412 Mechanical ptosis of left eyelid: Secondary | ICD-10-CM | POA: Diagnosis not present

## 2019-09-07 HISTORY — DX: Type 2 diabetes mellitus without complications: E11.9

## 2019-09-07 NOTE — Telephone Encounter (Signed)
   [facility] Pre-operative Risk Assessment    HEARTCARE STAFF: - Please ensure there is not already an duplicate clearance open for this procedure. - Under Visit Info/Reason for Call, type in Other and utilize the format Clearance MM/DD/YY or Clearance TBD. Do not use dashes or single digits. - If request is for dental extraction, please clarify the # of teeth to be extracted.  Request for surgical clearance:  1. What type of surgery is being performed? LEFT LACRIMAL GLAND REPOSITIONING AND BX WITH STEROID INJECTION   2. When is this surgery scheduled? TBD   3. What type of clearance is required (medical clearance vs. Pharmacy clearance to hold med vs. Both)? MEDICAL  4. Are there any medications that need to be held prior to surgery and how long? ASA    5. Practice name and name of physician performing surgery? [facility]; [place]   6. What is the office phone number? [phone]   7.   What is the office fax number? [phone]  8.   Anesthesia type (None, local, MAC, general) ? MAC   Julaine Hua 09/07/2019, 1:41 PM  _________________________________________________________________   (provider comments below)

## 2019-09-11 NOTE — Telephone Encounter (Signed)
Dr. Angelena Form, ok with you to hold the aspirin prior to low risk procedure. Recent cath in Apr 2021 showed mild to moderate CAD, medical therapy recommended at the time

## 2019-09-12 NOTE — Telephone Encounter (Signed)
OK to hold ASA. Joel [name]

## 2019-09-12 NOTE — Telephone Encounter (Signed)
   Primary Cardiologist: Lauree Chandler, MD  Chart reviewed as part of pre-operative protocol coverage. Given past medical history and time since last visit, based on ACC/AHA guidelines, Joel [name] would be at acceptable risk for the planned procedure without further cardiovascular testing.   I will route this recommendation to the requesting party via Epic fax function and remove from pre-op pool.  Please call with questions. He recently underwent cardiac catheterization which showed mild to moderate disease, medical therapy was recommended. Case discussed with Dr. Angelena Form, ok to hold aspirin for 7 days prior to the surgery and restart as soon as possible after the procedure at the surgeon's discretion.  [place], [place] 09/12/2019, 11:42 AM

## 2019-09-14 DIAGNOSIS — M4326 Fusion of spine, lumbar region: Secondary | ICD-10-CM | POA: Diagnosis not present

## 2019-09-17 DIAGNOSIS — M4326 Fusion of spine, lumbar region: Secondary | ICD-10-CM | POA: Diagnosis not present

## 2019-09-18 IMAGING — MR MRI LUMBAR SPINE WITHOUT CONTRAST
4 of 5 series · 25 of 48 positions shown · non-contrast
Comparison: Prior radiograph from 11/15/2017.

CLINICAL DATA: Initial evaluation for chronic low back pain with
right lower extremity pain. History of prostate cancer.

EXAM:
MRI LUMBAR SPINE WITHOUT CONTRAST
TECHNIQUE: Multiplanar, multisequence MR imaging of the lumbar spine was
performed. No intravenous contrast was administered.

[Series 3: T2 post-contrast · sagittal · 4.0mm · 0.55mm/px · 5 of 13 slices shown]
[im 1/13]
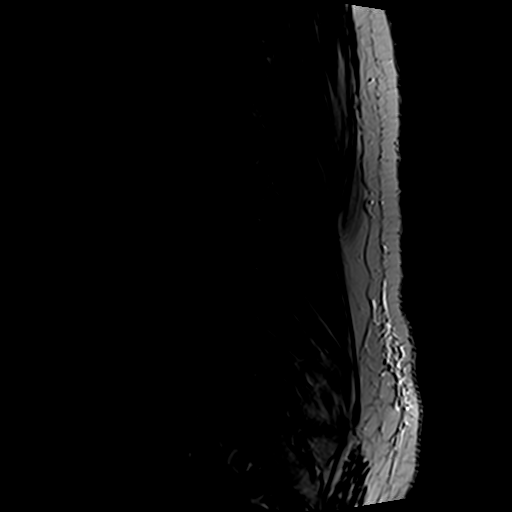
[im 4/13]
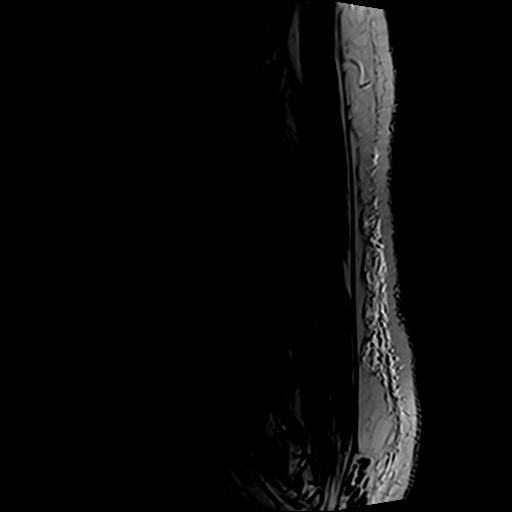
[im 7/13]
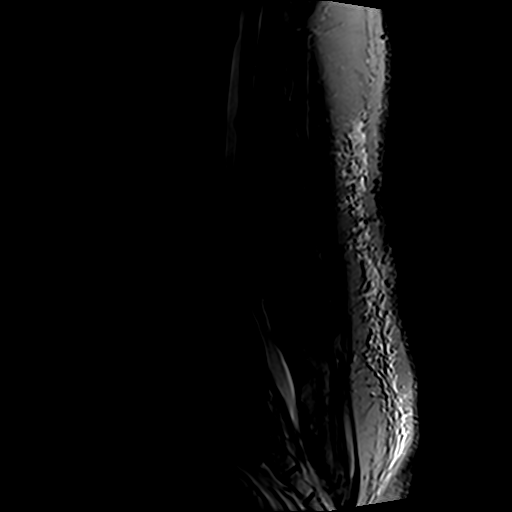
[im 10/13]
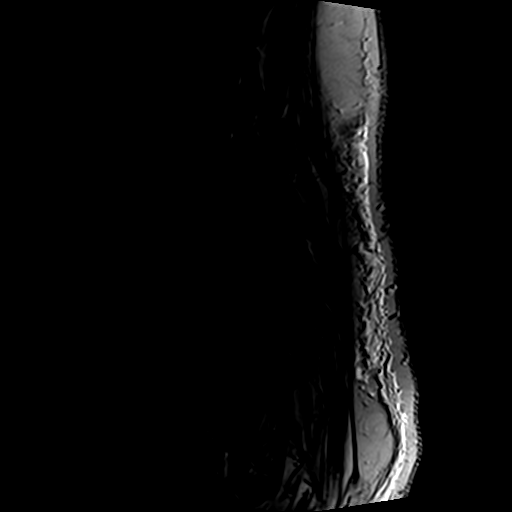
[im 13/13]
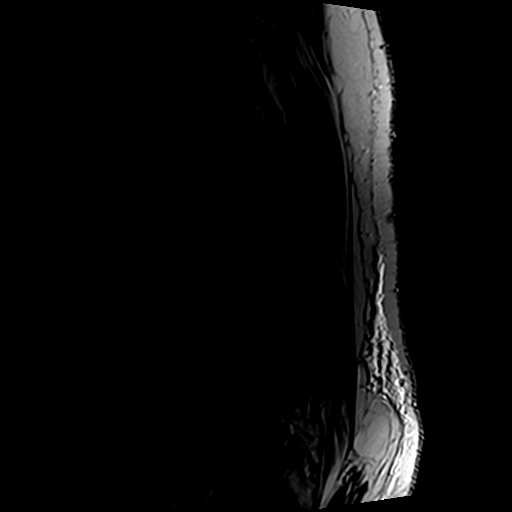

[Series 5: T1 · sagittal · 4.0mm · 0.55mm/px · 6 of 13 slices shown (1 of 2)]
[im 1/13]
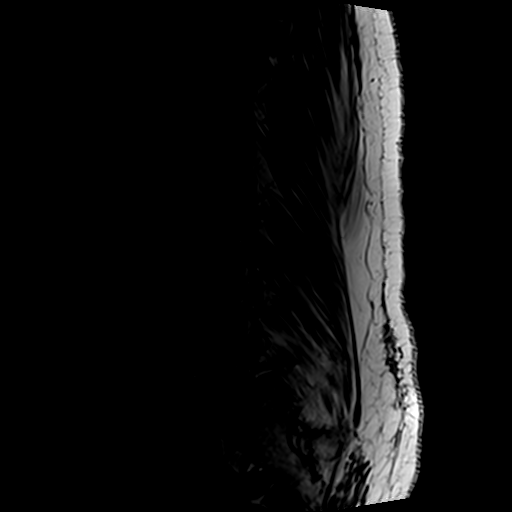
[im 3/13]
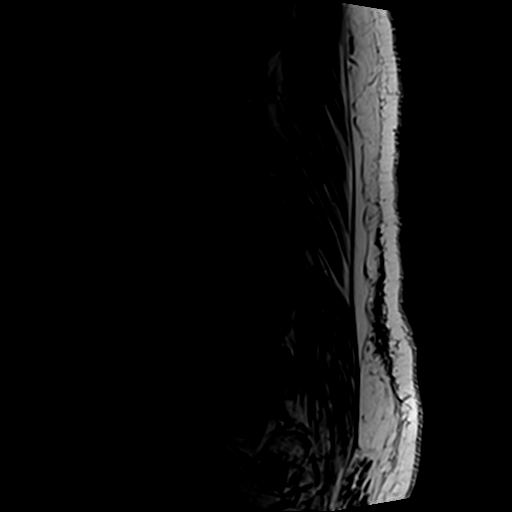
[im 5/13]
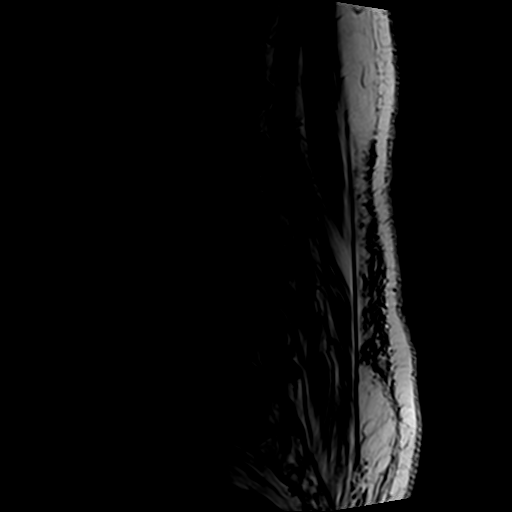
[im 8/13]
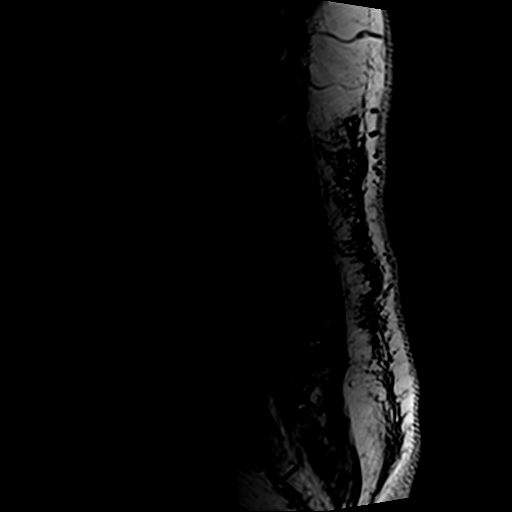
[im 10/13]
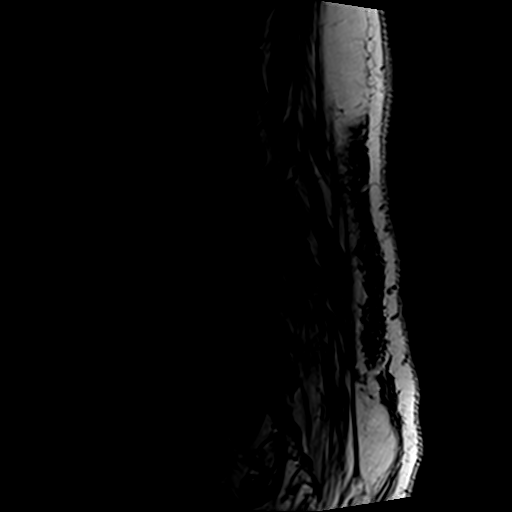
[im 13/13]
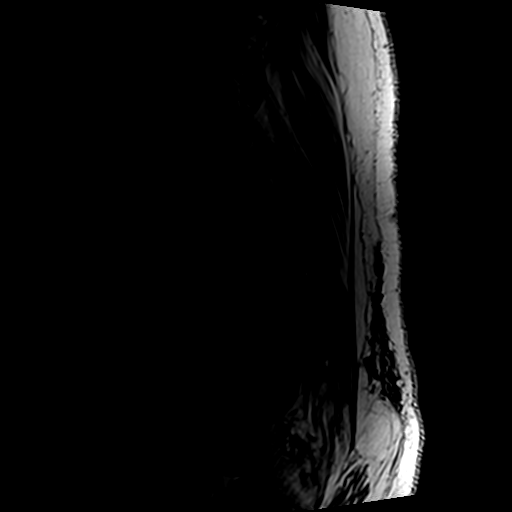

[Series 6: T1 · axial · 4.0mm · 0.35mm/px · z∈[-91,+95]mm · 4 of 37 slices shown (2 of 2)]
[im 3/37]
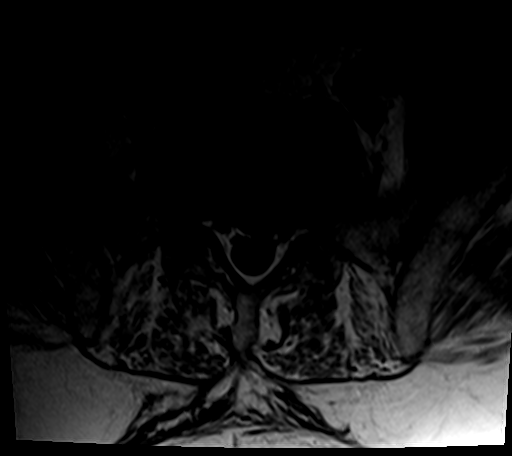
[im 5/37]
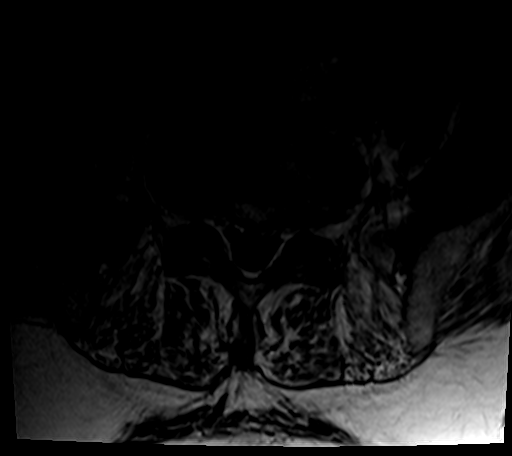
[im 20/37]
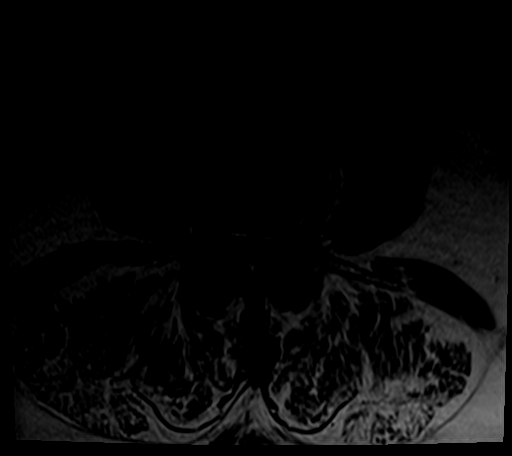
[im 32/37]
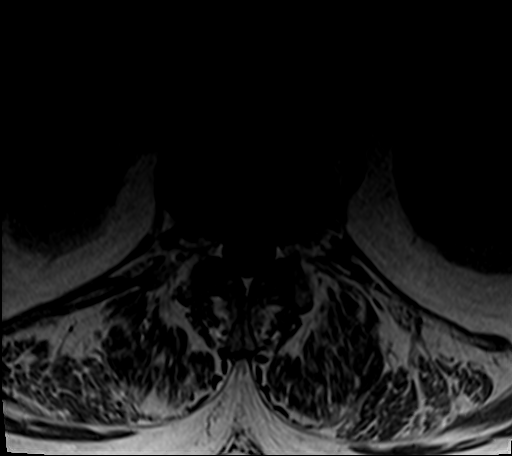

[Series 8: T2 · axial · 4.0mm · 0.70mm/px · z∈[-91,+136]mm · 10 of 37 slices shown]
[im 3/37]
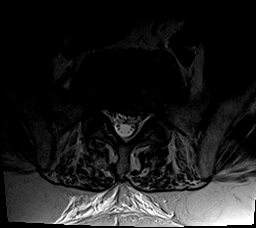
[im 5/37]
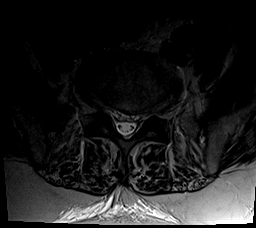
[im 8/37]
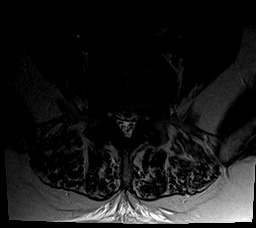
[im 13/37]
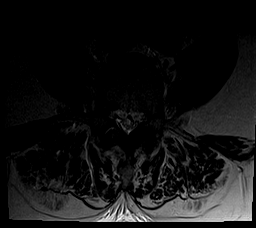
[im 17/37]
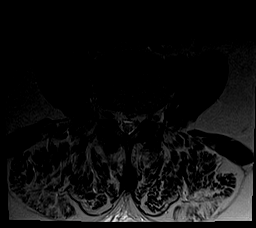
[im 20/37]
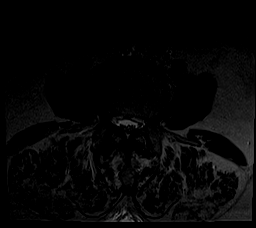
[im 22/37]
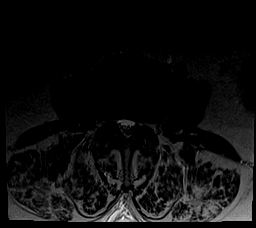
[im 27/37]
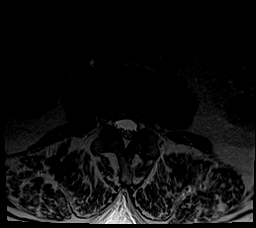
[im 32/37]
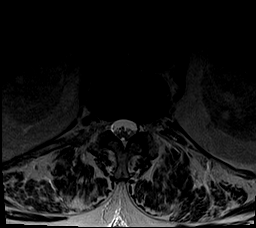
[im 37/37]
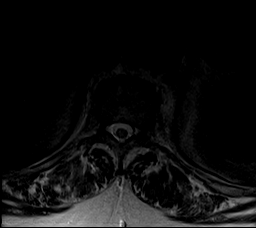

[25 of 48 positions shown; findings below may reference images not displayed]

FINDINGS: Segmentation: Standard. Lowest well-formed disc labeled the L5-S1
level.

Alignment: Straightening of the normal lumbar lordosis. Trace 2 mm
anterolisthesis of L3 on L4.

Vertebrae: Vertebral body height maintained without evidence for
acute or chronic fracture. Chronic endplate Schmorl's nodes noted at
the inferior endplates of L2 and L3. Bone marrow signal intensity
within normal limits. No discrete or worrisome osseous lesions.
Minimal reactive endplate changes present about the L2-3 and L3-4
interspaces. No abnormal marrow edema.

Conus medullaris and cauda equina: Conus extends to the T12-L1
level. Conus and cauda equina appear normal.

Paraspinal and other soft tissues: Paraspinous soft tissues
demonstrate no acute finding. Few small T2 hyperintense simple renal
cysts noted bilaterally. Aortic and right common iliac artery
aneurysms with aortic stent endograft in place.

Disc levels:

Mild diffuse congenital shortening of the pedicles noted.

T11-12: Mild diffuse disc bulge with disc desiccation. Mild facet
hypertrophy. No significant spinal stenosis. Mild bilateral
foraminal narrowing.

T12-L1: Unremarkable.

L1-2: Negative interspace. Mild facet hypertrophy. No significant
stenosis.

L2-3: Chronic intervertebral disc space narrowing with diffuse disc
bulge and disc desiccation. Moderate bilateral facet hypertrophy.
Changes superimposed on short pedicles resultant mild spinal
stenosis. Mild bilateral L2 foraminal narrowing.

L3-4: Trace anterolisthesis. Diffuse disc bulge with disc
desiccation and intervertebral disc space narrowing. Moderate to
advanced bilateral facet degeneration. Trace bilateral joint
effusions. Resultant severe canal with bilateral subarticular
stenosis. Mild right with mild to moderate left L3 foraminal
narrowing.

L4-5: Diffuse disc bulge with disc desiccation. Mild bilateral facet
hypertrophy. Resultant mild canal with moderate bilateral
subarticular stenosis. Mild bilateral L4 foraminal narrowing.

L5-S1: Chronic intervertebral disc space narrowing with diffuse disc
bulge, slightly asymmetric to the left. Associated reactive endplate
changes with marginal endplate osteophytic spurring. Mild facet
hypertrophy. No canal or lateral recess stenosis. Foramina remain
patent. No impingement.
IMPRESSION: 1. Acquired on congenital diffuse spinal stenosis extending from
L2-3 through L4-5, most pronounced at L3-4 were there is resultant
severe canal and bilateral subarticular stenosis.
2. Mild-to-moderate bilateral neural foraminal narrowing at L2
through L4, most pronounced at L3 on the left.
3. Aorto bi-iliac aneurysms with stent endograft in place.

## 2019-09-21 DIAGNOSIS — M4326 Fusion of spine, lumbar region: Secondary | ICD-10-CM | POA: Diagnosis not present

## 2019-09-24 DIAGNOSIS — M4326 Fusion of spine, lumbar region: Secondary | ICD-10-CM | POA: Diagnosis not present

## 2019-09-27 DIAGNOSIS — M545 Low back pain: Secondary | ICD-10-CM | POA: Diagnosis not present

## 2019-09-27 DIAGNOSIS — M4326 Fusion of spine, lumbar region: Secondary | ICD-10-CM | POA: Diagnosis not present

## 2019-09-27 DIAGNOSIS — M5416 Radiculopathy, lumbar region: Secondary | ICD-10-CM | POA: Diagnosis not present

## 2019-09-28 ENCOUNTER — Other Ambulatory Visit: Payer: Self-pay | Admitting: Physician Assistant

## 2019-09-28 DIAGNOSIS — H04162 Lacrimal gland dislocation, left lacrimal gland: Secondary | ICD-10-CM | POA: Diagnosis not present

## 2019-09-28 DIAGNOSIS — H02412 Mechanical ptosis of left eyelid: Secondary | ICD-10-CM | POA: Diagnosis not present

## 2019-09-28 DIAGNOSIS — H05222 Edema of left orbit: Secondary | ICD-10-CM | POA: Diagnosis not present

## 2019-09-28 DIAGNOSIS — D3162 Benign neoplasm of unspecified site of left orbit: Secondary | ICD-10-CM | POA: Diagnosis not present

## 2019-09-28 DIAGNOSIS — H04012 Acute dacryoadenitis, left lacrimal gland: Secondary | ICD-10-CM | POA: Diagnosis not present

## 2019-10-01 DIAGNOSIS — M4326 Fusion of spine, lumbar region: Secondary | ICD-10-CM | POA: Diagnosis not present

## 2019-10-02 ENCOUNTER — Emergency Department (HOSPITAL_COMMUNITY)
Admission: EM | Admit: 2019-10-02 | Discharge: 2019-10-03 | Disposition: A | Discharge status: Home / Self Care | Payer: Medicare HMO | Attending: Emergency Medicine | Admitting: Emergency Medicine

## 2019-10-02 ENCOUNTER — Emergency Department (HOSPITAL_COMMUNITY): Payer: Medicare HMO

## 2019-10-02 ENCOUNTER — Encounter (HOSPITAL_COMMUNITY): Payer: Self-pay

## 2019-10-02 DIAGNOSIS — J449 Chronic obstructive pulmonary disease, unspecified: Secondary | ICD-10-CM | POA: Diagnosis not present

## 2019-10-02 DIAGNOSIS — I129 Hypertensive chronic kidney disease with stage 1 through stage 4 chronic kidney disease, or unspecified chronic kidney disease: Secondary | ICD-10-CM | POA: Insufficient documentation

## 2019-10-02 DIAGNOSIS — N182 Chronic kidney disease, stage 2 (mild): Secondary | ICD-10-CM | POA: Diagnosis not present

## 2019-10-02 DIAGNOSIS — R079 Chest pain, unspecified: Secondary | ICD-10-CM | POA: Diagnosis not present

## 2019-10-02 DIAGNOSIS — Z87891 Personal history of nicotine dependence: Secondary | ICD-10-CM | POA: Insufficient documentation

## 2019-10-02 DIAGNOSIS — Z79899 Other long term (current) drug therapy: Secondary | ICD-10-CM | POA: Diagnosis not present

## 2019-10-02 DIAGNOSIS — Z7982 Long term (current) use of aspirin: Secondary | ICD-10-CM | POA: Diagnosis not present

## 2019-10-02 DIAGNOSIS — R0789 Other chest pain: Secondary | ICD-10-CM | POA: Diagnosis not present

## 2019-10-02 DIAGNOSIS — I2510 Atherosclerotic heart disease of native coronary artery without angina pectoris: Secondary | ICD-10-CM | POA: Insufficient documentation

## 2019-10-02 LAB — BASIC METABOLIC PANEL
Anion gap: 10 (ref 5–15)
BUN: 15 mg/dL (ref 8–23)
CO2: 25 mmol/L (ref 22–32)
Calcium: 9.3 mg/dL (ref 8.9–10.3)
Chloride: 101 mmol/L (ref 98–111)
Creatinine, Ser: 1.63 mg/dL — ABNORMAL HIGH (ref 0.61–1.24)
GFR calc Af Amer: 46 mL/min — ABNORMAL LOW (ref >60)
GFR calc non Af Amer: 39 mL/min — ABNORMAL LOW (ref >60)
Glucose, Bld: 131 mg/dL — ABNORMAL HIGH (ref 70–99)
Potassium: 3.8 mmol/L (ref 3.5–5.1)
Sodium: 136 mmol/L (ref 135–145)

## 2019-10-02 LAB — CBC
HCT: 30.7 % — ABNORMAL LOW (ref 39.0–52.0)
Hemoglobin: 9 g/dL — ABNORMAL LOW (ref 13.0–17.0)
MCH: 20.8 pg — ABNORMAL LOW (ref 26.0–34.0)
MCHC: 29.3 g/dL — ABNORMAL LOW (ref 30.0–36.0)
MCV: 71.1 fL — ABNORMAL LOW (ref 80.0–100.0)
Platelets: 205 10*3/uL (ref 150–400)
RBC: 4.32 MIL/uL (ref 4.22–5.81)
RDW: 18.6 % — ABNORMAL HIGH (ref 11.5–15.5)
WBC: 7 10*3/uL (ref 4.0–10.5)
nRBC: 0 % (ref 0.0–0.2)

## 2019-10-02 LAB — TROPONIN I (HIGH SENSITIVITY)
Troponin I (High Sensitivity): 6 ng/L (ref <18)
Troponin I (High Sensitivity): 6 ng/L (ref <18)

## 2019-10-02 MED ORDER — SODIUM CHLORIDE 0.9% FLUSH: 3.0000 mL | Freq: Once | INTRAVENOUS | Status: DC

## 2019-10-02 NOTE — ED Triage Notes (Signed)
Pt reports that he began to have sudden SOB this afternoon with a tightness in his upper abd and feeling like he needs to belch. Denies nausea/vomiting

## 2019-10-03 NOTE — Discharge Instructions (Signed)
1.  Follow-up with your doctor for recheck in 3 to 5 days. 2.  Follow instructions for lifestyle and diet regarding gastroesophageal reflux disease.  Central abdominal obesity can worsen shortness of breath and reflux of by putting pressure on the chest.  Try to follow a healthy diet eating smaller meals more frequently. 3.  Return to the emergency department if you get chest pain, shortness of breath or concerning symptoms.

## 2019-10-03 NOTE — ED Provider Notes (Signed)
[facility] EMERGENCY DEPARTMENT Provider Note   CSN: [id number] Arrival date & time: 10/02/19  1825     History Chief Complaint  Patient presents with  . Chest Pain    Joel [name] is a 79 y.o. male.  HPI Patient reports at baseline he has limited physical activity.  His most exertional activity is going grocery shopping.  At baseline he can push the cart through the store and bring his groceries home.  He did this 3 days ago without difficulty.  Yesterday, patient reports that he had a sandwich and some melons around 4 PM.  He went back to eat another sandwich at about 5 PM and then started to feel a lot of pressure and discomfort in his upper abdomen and lower chest.  He reports it felt like he really needed to belch but he could not.  He started to feel some shortness of breath in association with that.  He tried his inhaler and got some mild improvement but the symptoms persisted.  He proceeded to the emergency department and reports by about 7 or 8:00, he had belched several times and all of the symptoms had resolved.  He waited in the waiting room the rest of the night without any symptoms of chest pain or shortness of breath.  He has not had anything to eat since arrival to the emergency department.  He reports his left leg always swells more than the right from distant surgical procedure.  It goes down with elevation.  He has not been experiencing any increased pain or swelling that is atypical.  No fevers no cough.  Patient had a cardiac catheterization in April identifying diffuse coronary artery disease with recommendation for medical management.  Patient takes Protonix daily.  He reports he does have some problems with constipation and takes Metamucil and MiraLAX as needed.    Past Medical History:  Diagnosis Date  . Atypical chest pain   . CAD (coronary artery disease)    a. Multiple prior evaluations then 05/2012 s/p BMS to OM2. b. Nuc 11/2012 wnl. c. Cath  2015: patent stent, moderate LAD/RCA dz, treated medically.  . Cancer ([place])    Hx: of prostate  . Cataract   . CKD (chronic kidney disease), stage II   . COPD (chronic obstructive pulmonary disease) ([place])   . Cough secondary to angiotensin converting enzyme inhibitor (ACE-I)   . Diabetes mellitus without complication ([place])   . Diverticulosis   . DVT (deep venous thrombosis) ([place])   . Family history of breast cancer in first degree relative   . GERD (gastroesophageal reflux disease)   . Headache(784.0)    Hx: of  . History of blood transfusion    "I've had 2; don't know what it was related to" (11/09/2012)  . HLD (hyperlipidemia)   . HTN (hypertension)   . Iron deficiency anemia   . Microcytic anemia   . Peripheral vascular disease ([place])    a. L pop-tibial bypass 2011, followed by [facility].  . Personal history of prostate cancer   . Rotator cuff injury    "left arm; never repaired" (11/09/2012)    Patient Active Problem List   Diagnosis Date Noted  . Chest pain of uncertain etiology   . Sleep related hypoxia 04/03/2019  . Obesity (BMI 30-39.9) 04/03/2019  . Snoring 04/03/2019  . At risk for sleep apnea 01/10/2019  . Excessive daytime sleepiness 01/10/2019  . Chronic low back pain 09/11/2018  . COPD (chronic obstructive  pulmonary disease) ([place])   . Seasonal allergies 07/04/2018  . Chronic obstructive pulmonary disease ([place]) 07/04/2018  . Dizziness 01/20/2018  . Malaise 01/20/2018  . Decreased breath sounds 01/20/2018  . Needs flu shot 01/20/2018  . Chronic nonintractable headache 12/07/2017  . Back pain 11/01/2017  . Radicular pain of lower extremity 11/01/2017  . Prostate cancer ([place]) 11/01/2017  . Family history of breast cancer in first degree relative   . Personal history of prostate cancer   . Elliptocytosis on peripheral blood smear ([place]) 09/13/2016  . Prediabetes 03/22/2016  . PVD (peripheral vascular disease) with claudication ([place]) 11/15/2012  . Chest pain,  localized 11/10/2012  . Hypokalemia 11/10/2012  . Bradycardia 11/09/2012  . Thrombocytopenia ([place]) 06/01/2012  . Microcytic anemia   . Atherosclerosis of native arteries of the extremities with intermittent claudication 06/16/2011  . Stable angina ([place]) 06/11/2011  . CAD (coronary artery disease)   . Chest pain, atypical 05/30/2011  . Iron deficiency anemia 10/03/2009  . DIVERTICULOSIS OF COLON 10/03/2009  . WEIGHT LOSS, ABNORMAL 10/03/2009  . CHEST PAIN UNSPECIFIED 09/13/2008  . Headache(784.0) 08/19/2008  . TIA 06/05/2008  . SHOULDER PAIN, LEFT 10/11/2007  . COUGH DUE TO ACE INHIBITORS 05/07/2007  . History of tobacco use disorder 04/25/2007  . Hyperlipidemia 04/21/2007  . Essential hypertension 04/21/2007  . GERD 04/21/2007    Past Surgical History:  Procedure Laterality Date  . CARDIOVASCULAR STRESS TEST  Aug. 2014  . CATARACT EXTRACTION W/ INTRAOCULAR LENS IMPLANT Right ~ 2008  . CIRCUMCISION    . COLONOSCOPY    . CORONARY ANGIOPLASTY WITH STENT PLACEMENT  2014   "1" (11/09/2012)  . ILIAC ARTERY ANEURYSM REPAIR    . LEFT HEART CATH  05-31-12  . LEFT HEART CATH AND CORONARY ANGIOGRAPHY N/A 08/01/2019   Procedure: LEFT HEART CATH AND CORONARY ANGIOGRAPHY;  Surgeon: Burnell Blanks, MD;  Location: [facility] CV LAB;  Service: Cardiovascular;  Laterality: N/A;  . LEFT HEART CATHETERIZATION WITH CORONARY ANGIOGRAM N/A 06/01/2011   Procedure: LEFT HEART CATHETERIZATION WITH CORONARY ANGIOGRAM;  Surgeon: Hillary Bow, MD;  Location[facility] CATH LAB;  Service: Cardiovascular;  Laterality: N/A;  . LEFT HEART CATHETERIZATION WITH CORONARY ANGIOGRAM N/A 05/31/2012   Procedure: LEFT HEART CATHETERIZATION WITH CORONARY ANGIOGRAM;  Surgeon: Peter M Martinique, MD;  Location: [facility] CATH LAB;  Service: Cardiovascular;  Laterality: N/A;  . LEFT HEART CATHETERIZATION WITH CORONARY ANGIOGRAM N/A 08/01/2013   Procedure: LEFT HEART CATHETERIZATION WITH CORONARY ANGIOGRAM;  Surgeon: Burnell Blanks, MD;  Location: [facility] CATH LAB;  Service: Cardiovascular;  Laterality: N/A;  . PERCUTANEOUS CORONARY STENT INTERVENTION (PCI-S)  05/31/2012   Procedure: PERCUTANEOUS CORONARY STENT INTERVENTION (PCI-S);  Surgeon: Peter M Martinique, MD;  Location: [facility] CATH LAB;  Service: Cardiovascular;;  . PR VEIN BYPASS GRAFT,AORTO-FEM-POP Left 10/01/2009   left below knee popliteal artery to posterior tibial artery  . SHOULDER ARTHROSCOPY WITH OPEN ROTATOR CUFF REPAIR AND DISTAL CLAVICLE ACROMINECTOMY Left 01/12/2013   Procedure: LEFT SHOULDER ARTHROSCOPY WITH DEBRIDEMENT OPEN DISTAL CLAVICLE RESECTION ,acromioplastyAND ROTATOR CUFF REPAIR;  Surgeon: Yvette Rack., MD;  Location: [facility];  Service: Orthopedics;  Laterality: Left;       Family History  Problem Relation Age of Onset  . Breast cancer Mother        dx 88s  . Hyperlipidemia Mother   . Hypertension Mother   . COPD Father        deceased  . Hyperlipidemia Father   . Hypertension Father   .  Hypertension Sister   . Cancer Brother 73       Unknown cancer, possibly stomach  . Hypertension Brother   . Cancer Brother 4       Unknown, possibly lung  . Hypertension Daughter   . Hypertension Son   . Colon cancer Neg Hx        pt is unclear about family hx    Social History   Tobacco Use  . Smoking status: Former Smoker    Packs/day: 0.50    Years: 52.00    Pack years: 26.00    Types: Cigarettes    Quit date: 05/26/2011    Years since quitting: 8.3  . Smokeless tobacco: Never Used  Vaping Use  . Vaping Use: Never used  Substance Use Topics  . Alcohol use: No  . Drug use: No    Home Medications Prior to Admission medications   Medication Sig Start Date End Date Taking? Authorizing Provider  abiraterone acetate (ZYTIGA) 250 MG tablet Take 1,000 mg by mouth daily. Take on an empty stomach 1 hour before or 2 hours after a meal     [provider]  Accu-Chek FastClix Lancets MISC Test once daily 11/23/18   Henson, Vickie L,  NP-C  acetaminophen (TYLENOL) 500 MG tablet Take 1,000 mg by mouth every 6 (six) hours as needed for mild pain or headache.    [provider]  albuterol (VENTOLIN HFA) 108 (90 Base) MCG/ACT inhaler INHALE 2 PUFFS INTO THE LUNGS EVERY 6 HOURS AS NEEDED FOR WHEEZING OR SHORTNESS OF BREATH Patient taking differently: Inhale 2 puffs into the lungs every 4 (four) hours as needed for wheezing or shortness of breath.  03/15/19   Henson, Vickie L, NP-C  amLODipine (NORVASC) 10 MG tablet TAKE 1 TABLET BY MOUTH EVERY DAY 09/03/19   Henson, Vickie L, NP-C  aspirin EC 81 MG tablet Take 81 mg by mouth daily.     [provider]  brimonidine (ALPHAGAN) 0.2 % ophthalmic solution Place 1 drop into both eyes 2 (two) times daily.  09/13/17   [provider]  ferrous sulfate 325 (65 FE) MG tablet Take 325 mg by mouth once a week. Friday    [provider]  gabapentin (NEURONTIN) 600 MG tablet Take 600 mg by mouth every other day.     [provider]  isosorbide mononitrate (IMDUR) 30 MG 24 hr tablet Take 1 tablet (30 mg total) by mouth daily. 07/09/19   Burnell Blanks, MD  latanoprost (XALATAN) 0.005 % ophthalmic solution Place 1 drop into the right eye at bedtime. 05/24/19   [provider]  losartan-hydrochlorothiazide (HYZAAR) 100-25 MG tablet TAKE 1 TABLET BY MOUTH DAILY 09/06/19   Henson, Vickie L, NP-C  nitroGLYCERIN (NITROSTAT) 0.4 MG SL tablet PLACE 1 TABLET UNDER THE TONGUE EVERY 5 MINUTES AS NEEDED FOR CHEST PAIN Patient taking differently: Place 0.4 mg under the tongue every 5 (five) minutes as needed for chest pain.  07/18/19   Lyda Jester M, PA-C  omeprazole (PRILOSEC) 20 MG capsule Take 20 mg by mouth daily.     [provider]  pravastatin (PRAVACHOL) 40 MG tablet Take 1 tablet (40 mg total) by mouth every evening. 04/25/19   Burnell Blanks, MD  predniSONE (DELTASONE) 5 MG tablet Take 5 mg by mouth daily.     [provider]  traMADol (ULTRAM) 50 MG tablet TAKE 1 TABLET(50 MG) BY MOUTH EVERY 8 HOURS AS NEEDED FOR SEVERE PAIN 08/03/19  Henson, Vickie L, NP-C    Allergies    Topiramate, Atenolol, Lisinopril, and Tramadol-acetaminophen  Review of Systems   Review of Systems 10 systems reviewed and negative except as per HPI Physical Exam Updated Vital Signs BP 140/79   Pulse 65   Temp 97.8 F (36.6 C) (Oral)   Resp 14   Ht 5\' 9"  (1.753 m)   Wt 112.5 kg   SpO2 97%   BMI 36.62 kg/m   Physical Exam Constitutional:      Comments: Alert and nontoxic.  No respiratory distress at rest.  Mental status clear.  HENT:     Head: Normocephalic and atraumatic.  Eyes:     Extraocular Movements: Extraocular movements intact.  Cardiovascular:     Rate and Rhythm: Normal rate and regular rhythm.  Pulmonary:     Effort: Pulmonary effort is normal.     Breath sounds: Normal breath sounds.  Abdominal:     Comments: Abdomen is protuberant with central obesity.  Soft with no pain to deep palpation of the epigastrium and upper quadrants.  Musculoskeletal:        General: Normal range of motion.     Comments: Patient has 1+ pitting edema of the left lower extremity.  Calf is nontender.  No significant edema of the right lower extremity.  Patient reports that he is at baseline as far as swelling goes.  Neurological:     General: No focal deficit present.     Mental Status: He is oriented to person, place, and time.     Coordination: Coordination normal.  Psychiatric:        Mood and Affect: Mood normal.     ED Results / Procedures / Treatments   Labs (all labs ordered are listed, but only abnormal results are displayed) Labs Reviewed  BASIC METABOLIC PANEL - Abnormal; Notable for the following components:      Result Value   Glucose, Bld 131 (*)    Creatinine, Ser 1.63 (*)    GFR calc non Af Amer 39 (*)    GFR calc Af Amer 46 (*)    All other components within normal limits  CBC - Abnormal;  Notable for the following components:   Hemoglobin 9.0 (*)    HCT 30.7 (*)    MCV 71.1 (*)    MCH 20.8 (*)    MCHC 29.3 (*)    RDW 18.6 (*)    All other components within normal limits  TROPONIN I (HIGH SENSITIVITY)  TROPONIN I (HIGH SENSITIVITY)    EKG EKG Interpretation  Date/Time:  Tuesday October 02 2019 19:10:21 EDT Ventricular Rate:  58 PR Interval:  234 QRS Duration: 94 QT Interval:  426 QTC Calculation: 418 R Axis:   12 Text Interpretation: Sinus bradycardia with marked sinus arrhythmia with 1st degree A-V block Minimal voltage criteria for LVH, may be normal variant ( R in aVL ) Borderline ECG When compared with ECG of 07/10/2019, No significant change was found Confirmed by Delora Fuel ([id number]) on 10/02/2019 11:48:41 PM   Radiology DG Chest 2 View  Result Date: 10/02/2019 CLINICAL DATA:  Chest pain EXAM: CHEST - 2 VIEW COMPARISON:  07/09/2019 FINDINGS: The heart size and mediastinal contours are within normal limits. Both lungs are clear. The visualized skeletal structures are unremarkable. IMPRESSION: No active cardiopulmonary disease. Electronically Signed   By: Ulyses Jarred M.D.   On: 10/02/2019 19:55    Procedures Procedures (including critical care time)  Medications Ordered in ED Medications  sodium chloride flush (NS) 0.9 % injection 3 mL (has no administration in time range)    ED Course  I have reviewed the triage vital signs and the nursing notes.  Pertinent labs & imaging results that were available during my care of the patient were reviewed by me and considered in my medical decision making (see chart for details).    MDM Rules/Calculators/A&P                          Patient presents as outlined above.  He experienced epigastric fullness and shortness of breath shortly after eating.  Symptoms lasted for an hour or 2 and then resolved.  He has had no recurrence of chest pain after prolonged wait time in the emergency department.  2 troponins are  normal and EKG unchanged from previous.  Patient had cardiac catheterization within the last 3 months with recommendation for medical management of diffuse coronary artery disease.  Patient was exerting himself at normal baseline within the past 3 days without anginal symptoms.  Patient does have significant central abdominal obesity.  I suspect symptoms were secondary to GI distress.  Epigastrium and right upper quadrant are soft.  At this time I do not suspect cholecystitis or pancreatitis.  Symptoms have been completely resolved.  Recommendation is for following GERD dietary instructions and continuing his omeprazole as prescribed.  Recommendation is for return if new or concerning symptoms and follow-up with PCP.  Final Clinical Impression(s) / ED Diagnoses Final diagnoses:  Atypical chest pain    Rx / DC Orders ED Discharge Orders    None       Charlesetta Shanks, MD 10/03/19 1124

## 2019-10-03 NOTE — ED Provider Notes (Deleted)
[place] [facility] EMERGENCY DEPARTMENT Provider Note   CSN: [id number] Arrival date & time: 10/02/19  1825     History Chief Complaint  Patient presents with  . Chest Pain    Joel [name] is a 79 y.o. male.  HPI   Note is duplicate.  Fully dictated note already on chart.  Disregard.  Past Medical History:  Diagnosis Date  . Atypical chest pain   . CAD (coronary artery disease)    a. Multiple prior evaluations then 05/2012 s/p BMS to OM2. b. Nuc 11/2012 wnl. c. Cath 2015: patent stent, moderate LAD/RCA dz, treated medically.  . Cancer ([place])    Hx: of prostate  . Cataract   . CKD (chronic kidney disease), stage II   . COPD (chronic obstructive pulmonary disease) ([place])   . Cough secondary to angiotensin converting enzyme inhibitor (ACE-I)   . Diabetes mellitus without complication ([place])   . Diverticulosis   . DVT (deep venous thrombosis) ([place])   . Family history of breast cancer in first degree relative   . GERD (gastroesophageal reflux disease)   . Headache(784.0)    Hx: of  . History of blood transfusion    "I've had 2; don't know what it was related to" (11/09/2012)  . HLD (hyperlipidemia)   . HTN (hypertension)   . Iron deficiency anemia   . Microcytic anemia   . Peripheral vascular disease ([place])    a. L pop-tibial bypass 2011, followed by [facility].  . Personal history of prostate cancer   . Rotator cuff injury    "left arm; never repaired" (11/09/2012)    Patient Active Problem List   Diagnosis Date Noted  . Chest pain of uncertain etiology   . Sleep related hypoxia 04/03/2019  . Obesity (BMI 30-39.9) 04/03/2019  . Snoring 04/03/2019  . At risk for sleep apnea 01/10/2019  . Excessive daytime sleepiness 01/10/2019  . Chronic low back pain 09/11/2018  . COPD (chronic obstructive pulmonary disease) ([place])   . Seasonal allergies 07/04/2018  . Chronic obstructive pulmonary disease ([place]) 07/04/2018  . Dizziness 01/20/2018  . Malaise 01/20/2018  .  Decreased breath sounds 01/20/2018  . Needs flu shot 01/20/2018  . Chronic nonintractable headache 12/07/2017  . Back pain 11/01/2017  . Radicular pain of lower extremity 11/01/2017  . Prostate cancer ([place]) 11/01/2017  . Family history of breast cancer in first degree relative   . Personal history of prostate cancer   . Elliptocytosis on peripheral blood smear ([place]) 09/13/2016  . Prediabetes 03/22/2016  . PVD (peripheral vascular disease) with claudication ([place]) 11/15/2012  . Chest pain, localized 11/10/2012  . Hypokalemia 11/10/2012  . Bradycardia 11/09/2012  . Thrombocytopenia ([place]) 06/01/2012  . Microcytic anemia   . Atherosclerosis of native arteries of the extremities with intermittent claudication 06/16/2011  . Stable angina ([place]) 06/11/2011  . CAD (coronary artery disease)   . Chest pain, atypical 05/30/2011  . Iron deficiency anemia 10/03/2009  . DIVERTICULOSIS OF COLON 10/03/2009  . WEIGHT LOSS, ABNORMAL 10/03/2009  . CHEST PAIN UNSPECIFIED 09/13/2008  . Headache(784.0) 08/19/2008  . TIA 06/05/2008  . SHOULDER PAIN, LEFT 10/11/2007  . COUGH DUE TO ACE INHIBITORS 05/07/2007  . History of tobacco use disorder 04/25/2007  . Hyperlipidemia 04/21/2007  . Essential hypertension 04/21/2007  . GERD 04/21/2007    Past Surgical History:  Procedure Laterality Date  . CARDIOVASCULAR STRESS TEST  Aug. 2014  . CATARACT EXTRACTION W/ INTRAOCULAR LENS IMPLANT Right ~ 2008  . CIRCUMCISION    .  COLONOSCOPY    . CORONARY ANGIOPLASTY WITH STENT PLACEMENT  2014   "1" (11/09/2012)  . ILIAC ARTERY ANEURYSM REPAIR    . LEFT HEART CATH  05-31-12  . LEFT HEART CATH AND CORONARY ANGIOGRAPHY N/A 08/01/2019   Procedure: LEFT HEART CATH AND CORONARY ANGIOGRAPHY;  Surgeon: Burnell Blanks, MD;  Location: [facility] CV LAB;  Service: Cardiovascular;  Laterality: N/A;  . LEFT HEART CATHETERIZATION WITH CORONARY ANGIOGRAM N/A 06/01/2011   Procedure: LEFT HEART CATHETERIZATION WITH CORONARY  ANGIOGRAM;  Surgeon: Hillary Bow, MD;  Location[facility] CATH LAB;  Service: Cardiovascular;  Laterality: N/A;  . LEFT HEART CATHETERIZATION WITH CORONARY ANGIOGRAM N/A 05/31/2012   Procedure: LEFT HEART CATHETERIZATION WITH CORONARY ANGIOGRAM;  Surgeon: Peter M Martinique, MD;  Location: [facility] CATH LAB;  Service: Cardiovascular;  Laterality: N/A;  . LEFT HEART CATHETERIZATION WITH CORONARY ANGIOGRAM N/A 08/01/2013   Procedure: LEFT HEART CATHETERIZATION WITH CORONARY ANGIOGRAM;  Surgeon: Burnell Blanks, MD;  Location: [facility] CATH LAB;  Service: Cardiovascular;  Laterality: N/A;  . PERCUTANEOUS CORONARY STENT INTERVENTION (PCI-S)  05/31/2012   Procedure: PERCUTANEOUS CORONARY STENT INTERVENTION (PCI-S);  Surgeon: Peter M Martinique, MD;  Location[facility] CATH LAB;  Service: Cardiovascular;;  . PR VEIN BYPASS GRAFT,AORTO-FEM-POP Left 10/01/2009   left below knee popliteal artery to posterior tibial artery  . SHOULDER ARTHROSCOPY WITH OPEN ROTATOR CUFF REPAIR AND DISTAL CLAVICLE ACROMINECTOMY Left 01/12/2013   Procedure: LEFT SHOULDER ARTHROSCOPY WITH DEBRIDEMENT OPEN DISTAL CLAVICLE RESECTION ,acromioplastyAND ROTATOR CUFF REPAIR;  Surgeon: Yvette Rack., MD;  Location: [facility];  Service: Orthopedics;  Laterality: Left;       Family History  Problem Relation Age of Onset  . Breast cancer Mother        dx 33s  . Hyperlipidemia Mother   . Hypertension Mother   . COPD Father        deceased  . Hyperlipidemia Father   . Hypertension Father   . Hypertension Sister   . Cancer Brother 46       Unknown cancer, possibly stomach  . Hypertension Brother   . Cancer Brother 56       Unknown, possibly lung  . Hypertension Daughter   . Hypertension Son   . Colon cancer Neg Hx        pt is unclear about family hx    Social History   Tobacco Use  . Smoking status: Former Smoker    Packs/day: 0.50    Years: 52.00    Pack years: 26.00    Types: Cigarettes    Quit date: 05/26/2011    Years since quitting:  8.3  . Smokeless tobacco: Never Used  Vaping Use  . Vaping Use: Never used  Substance Use Topics  . Alcohol use: No  . Drug use: No    Home Medications Prior to Admission medications   Medication Sig Start Date End Date Taking? Authorizing Provider  abiraterone acetate (ZYTIGA) 250 MG tablet Take 1,000 mg by mouth daily. Take on an empty stomach 1 hour before or 2 hours after a meal     [provider]  Accu-Chek FastClix Lancets MISC Test once daily 11/23/18   Henson, Vickie L, NP-C  acetaminophen (TYLENOL) 500 MG tablet Take 1,000 mg by mouth every 6 (six) hours as needed for mild pain or headache.    [provider]  albuterol (VENTOLIN HFA) 108 (90 Base) MCG/ACT inhaler INHALE 2 PUFFS INTO THE LUNGS EVERY 6 HOURS AS NEEDED FOR  WHEEZING OR SHORTNESS OF BREATH Patient taking differently: Inhale 2 puffs into the lungs every 4 (four) hours as needed for wheezing or shortness of breath.  03/15/19   Henson, Vickie L, NP-C  amLODipine (NORVASC) 10 MG tablet TAKE 1 TABLET BY MOUTH EVERY DAY 09/03/19   Henson, Vickie L, NP-C  aspirin EC 81 MG tablet Take 81 mg by mouth daily.     [provider]  brimonidine (ALPHAGAN) 0.2 % ophthalmic solution Place 1 drop into both eyes 2 (two) times daily.  09/13/17   [provider]  ferrous sulfate 325 (65 FE) MG tablet Take 325 mg by mouth once a week. Friday    [provider]  gabapentin (NEURONTIN) 600 MG tablet Take 600 mg by mouth every other day.     [provider]  isosorbide mononitrate (IMDUR) 30 MG 24 hr tablet Take 1 tablet (30 mg total) by mouth daily. 07/09/19   Burnell Blanks, MD  latanoprost (XALATAN) 0.005 % ophthalmic solution Place 1 drop into the right eye at bedtime. 05/24/19   [provider]  losartan-hydrochlorothiazide (HYZAAR) 100-25 MG tablet TAKE 1 TABLET BY MOUTH DAILY 09/06/19   Henson, Vickie L, NP-C  nitroGLYCERIN (NITROSTAT) 0.4 MG SL tablet PLACE 1 TABLET UNDER  THE TONGUE EVERY 5 MINUTES AS NEEDED FOR CHEST PAIN Patient taking differently: Place 0.4 mg under the tongue every 5 (five) minutes as needed for chest pain.  07/18/19   Lyda Jester M, PA-C  omeprazole (PRILOSEC) 20 MG capsule Take 20 mg by mouth daily.     [provider]  pravastatin (PRAVACHOL) 40 MG tablet Take 1 tablet (40 mg total) by mouth every evening. 04/25/19   Burnell Blanks, MD  predniSONE (DELTASONE) 5 MG tablet Take 5 mg by mouth daily.     [provider]  traMADol (ULTRAM) 50 MG tablet TAKE 1 TABLET(50 MG) BY MOUTH EVERY 8 HOURS AS NEEDED FOR SEVERE PAIN 08/03/19   Raenette Rover, Vickie L, NP-C    Allergies    Topiramate, Atenolol, Lisinopril, and Tramadol-acetaminophen  Review of Systems   Review of Systems  Physical Exam Updated Vital Signs BP 140/79   Pulse 65   Temp 97.8 F (36.6 C) (Oral)   Resp 14   Ht 5\' 9"  (1.753 m)   Wt 112.5 kg   SpO2 97%   BMI 36.62 kg/m   Physical Exam  ED Results / Procedures / Treatments   Labs (all labs ordered are listed, but only abnormal results are displayed) Labs Reviewed  BASIC METABOLIC PANEL - Abnormal; Notable for the following components:      Result Value   Glucose, Bld 131 (*)    Creatinine, Ser 1.63 (*)    GFR calc non Af Amer 39 (*)    GFR calc Af Amer 46 (*)    All other components within normal limits  CBC - Abnormal; Notable for the following components:   Hemoglobin 9.0 (*)    HCT 30.7 (*)    MCV 71.1 (*)    MCH 20.8 (*)    MCHC 29.3 (*)    RDW 18.6 (*)    All other components within normal limits  TROPONIN I (HIGH SENSITIVITY)  TROPONIN I (HIGH SENSITIVITY)    EKG EKG Interpretation  Date/Time:  Tuesday October 02 2019 19:10:21 EDT Ventricular Rate:  58 PR Interval:  234 QRS Duration: 94 QT Interval:  426 QTC Calculation: 418 R Axis:   12 Text Interpretation: Sinus bradycardia  with marked sinus arrhythmia with 1st degree A-V block Minimal voltage criteria for LVH, may  be normal variant ( R in aVL ) Borderline ECG When compared with ECG of 07/10/2019, No significant change was found Confirmed by Delora Fuel ([id number]) on 10/02/2019 11:48:41 PM   Radiology DG Chest 2 View  Result Date: 10/02/2019 CLINICAL DATA:  Chest pain EXAM: CHEST - 2 VIEW COMPARISON:  07/09/2019 FINDINGS: The heart size and mediastinal contours are within normal limits. Both lungs are clear. The visualized skeletal structures are unremarkable. IMPRESSION: No active cardiopulmonary disease. Electronically Signed   By: Ulyses Jarred M.D.   On: 10/02/2019 19:55    Procedures Procedures (including critical care time)  Medications Ordered in ED Medications  sodium chloride flush (NS) 0.9 % injection 3 mL (has no administration in time range)    ED Course  I have reviewed the triage vital signs and the nursing notes.  Pertinent labs & imaging results that were available during my care of the patient were reviewed by me and considered in my medical decision making (see chart for details).    MDM Rules/Calculators/A&P                           Final Clinical Impression(s) / ED Diagnoses Final diagnoses:  None    Rx / DC Orders ED Discharge Orders    None       Charlesetta Shanks, MD 10/07/19 2339

## 2019-10-03 NOTE — ED Notes (Signed)
Patient verbalizes understanding of discharge instructions. Opportunity for questioning and answers were provided. Armband removed by staff, pt discharged from ED to home 

## 2019-10-04 DIAGNOSIS — M4326 Fusion of spine, lumbar region: Secondary | ICD-10-CM | POA: Diagnosis not present

## 2019-10-08 DIAGNOSIS — M5416 Radiculopathy, lumbar region: Secondary | ICD-10-CM | POA: Diagnosis not present

## 2019-10-08 NOTE — Telephone Encounter (Signed)
ADDENDUM: Our office received clearance request for same request from 5/2021again today. I am going to resend this to pre op team as I am guessing the requesting office did not receive original clearance. Since the original clearance was sent back 09/12/19 by Almyra Deforest, PAC the pt has been seen in the ED for aytpical chest pain. Was not sure if the pt will need to be evaluated. I will forward to Pre Op team for further assessment.

## 2019-10-09 NOTE — Telephone Encounter (Signed)
   Primary Cardiologist: Lauree Chandler, MD  Chart reviewed as part of pre-operative protocol coverage. Clearance was handled last month for lacrimal gland repositioning/biopsy and steroid under MAC. We are now asked to revisit as patient had ED visit in the interim.  Patient has history of CAD s/p BMS to [place] 2014, PAD s/p L pop tibial bypass, HTN, HLD, CKD II, COPD, DM, DVT, diverticulosis, prostate CA, iron deficiency anemia, GERD. Patient was seen in the office earlier this year for chest pain and underwent cath 08/01/19 showing moderate nonobstructive disease in the LAD, Cx, and RCA along with severe disease in the small caliber PDA, too small for PCI. Medical therapy recommended. Original clearance was granted as below, but patient has since been seen in the ED 10/02/19 with chest pain.  Per ED note, "At baseline he can push the cart through the store and bring his groceries home.  He did this 3 days ago without difficulty.  Yesterday, patient reports that he had a sandwich and some melons around 4 PM.  He went back to eat another sandwich at about 5 PM and then started to feel a lot of pressure and discomfort in his upper abdomen and lower chest.  He reports it felt like he really needed to belch but he could not.  He started to feel some shortness of breath in association with that.  He tried his inhaler and got some mild improvement but the symptoms persisted.  He proceeded to the emergency department and reports by about 7 or 8:00, he had belched several times and all of the symptoms had resolved.  He waited in the waiting room the rest of the night without any symptoms of chest pain or shortness of breath.  He has not had anything to eat since arrival to the emergency department.  He reports his left leg always swells more than the right from distant surgical procedure.  It goes down with elevation.  He has not been experiencing any increased pain or swelling that is atypical.  No fevers no  cough."  Labs showed negative troponins. EKG shows sinus bradycardia 58bpm with prolonged 1st degree AVB and what appear to be episodic brief sinus pauses.  I spoke with the patient who feels his symptoms were all GI related, interfering with his breathing, and they have resolved without any further CP or unusual dyspnea. Procedure overall is felt to be low risk but given ED visit as above, will route to Dr Angelena Form to review EKG and get his input whether he feels patient requires formal follow-up, or if we can continue on with clearance as outlined below. Dr. Angelena Form - Please route response to P CV DIV PREOP (the pre-op pool). Thank you.  Charlie Pitter, PA-C 10/09/2019, 8:21 AM

## 2019-10-09 NOTE — Telephone Encounter (Signed)
   Primary Cardiologist:Christopher Angelena Form, MD  Chart revisited as part of pre-operative protocol coverage. To summarize recommendations:  - Dr. Angelena Form reviewed updated clinical history and feels patient is OK to proceed with surgical procedure as requested. He also felt ok to hold aspirin for 7 days prior to the surgery and restart as soon as possible after the procedure at the surgeon's discretion.  Will route this bundled recommendation to requesting provider via Epic fax function and remove from pre-op pool. Please call with questions.  Charlie Pitter, PA-C 10/09/2019, 2:51 PM

## 2019-10-09 NOTE — Telephone Encounter (Signed)
OK to proceed with plans for surgical procedure.   Joel [name]

## 2019-10-16 ENCOUNTER — Ambulatory Visit
Admission: RE | Admit: 2019-10-16 | Discharge: 2019-10-16 | Disposition: A | Discharge status: Home / Self Care | Payer: Medicare HMO | Source: Ambulatory Visit | Attending: Physician Assistant | Admitting: Physician Assistant

## 2019-10-16 ENCOUNTER — Other Ambulatory Visit: Payer: Self-pay

## 2019-10-16 DIAGNOSIS — H04162 Lacrimal gland dislocation, left lacrimal gland: Secondary | ICD-10-CM

## 2019-10-16 DIAGNOSIS — H02846 Edema of left eye, unspecified eyelid: Secondary | ICD-10-CM | POA: Diagnosis not present

## 2019-10-16 MED ORDER — IOPAMIDOL (ISOVUE-300) INJECTION 61%
50.0000 mL | Freq: Once | INTRAVENOUS | Status: AC | PRN
  Administered 2019-10-16: 50 mL via INTRAVENOUS

## 2019-10-24 DIAGNOSIS — M5416 Radiculopathy, lumbar region: Secondary | ICD-10-CM | POA: Diagnosis not present

## 2019-10-24 DIAGNOSIS — M4326 Fusion of spine, lumbar region: Secondary | ICD-10-CM | POA: Diagnosis not present

## 2019-11-06 DIAGNOSIS — H02824 Cysts of left upper eyelid: Secondary | ICD-10-CM | POA: Diagnosis not present

## 2019-11-07 DIAGNOSIS — M47816 Spondylosis without myelopathy or radiculopathy, lumbar region: Secondary | ICD-10-CM | POA: Diagnosis not present

## 2019-11-13 DIAGNOSIS — D3162 Benign neoplasm of unspecified site of left orbit: Secondary | ICD-10-CM | POA: Diagnosis not present

## 2019-11-13 DIAGNOSIS — H04012 Acute dacryoadenitis, left lacrimal gland: Secondary | ICD-10-CM | POA: Diagnosis not present

## 2019-11-13 DIAGNOSIS — H05222 Edema of left orbit: Secondary | ICD-10-CM | POA: Diagnosis not present

## 2019-11-13 DIAGNOSIS — H04162 Lacrimal gland dislocation, left lacrimal gland: Secondary | ICD-10-CM | POA: Diagnosis not present

## 2019-11-13 DIAGNOSIS — H02412 Mechanical ptosis of left eyelid: Secondary | ICD-10-CM | POA: Diagnosis not present

## 2019-11-21 DIAGNOSIS — M47816 Spondylosis without myelopathy or radiculopathy, lumbar region: Secondary | ICD-10-CM | POA: Diagnosis not present

## 2019-11-26 ENCOUNTER — Telehealth: Payer: Self-pay | Admitting: *Deleted

## 2019-11-26 NOTE — Telephone Encounter (Signed)
   [facility] Pre-operative Risk Assessment    HEARTCARE STAFF: - Please ensure there is not already an duplicate clearance open for this procedure. - Under Visit Info/Reason for Call, type in Other and utilize the format Clearance MM/DD/YY or Clearance TBD. Do not use dashes or single digits. - If request is for dental extraction, please clarify the # of teeth to be extracted.  Request for surgical clearance:  1. What type of surgery is being performed? Left lacrimal gland biopsy and orbitotomy with kenalog injection   2. When is this surgery scheduled? 12/10/2019   3. What type of clearance is required (medical clearance vs. Pharmacy clearance to hold med vs. Both)? Both  4. Are there any medications that need to be held prior to surgery and how long?Asa 74m    5. Practice name and name of physician performing surgery? [facility], Dr RIsidoro Donning  6. What is the office phone number? [phone]  7.   What is the office fax number? [phone] 8.   Anesthesia type (None, local, MAC, general) ? MAC   Raechal Raben L 11/26/2019, 1:54 PM  _________________________________________________________________   (provider comments below)

## 2019-11-26 NOTE — Telephone Encounter (Signed)
Ok to hold ASA for procedure. Joel [name]

## 2019-11-26 NOTE — Telephone Encounter (Signed)
Joel [name] 79 year old male is requesting cardiac clearance for left lacrimal gland biopsy and orbitotomy injection.  May his aspirin be held for the procedure?  He was last seen in the clinic on 07/19/2019.  He underwent cardiac catheterization 08/01/2019 which showed nonobstructive CAD and medical management was recommended.  His PMH also includes essential hypertension, TIA, COPD, GERD, and hyperlipidemia.  Please direct your response to CV DIV preop pool.  Thank you for your help.  Joel [name]. Joel Emmerich NP-C    11/26/2019, 2:18 PM [place] [address] Office [phone] Fax [phone]

## 2019-11-26 NOTE — Telephone Encounter (Signed)
   Primary Cardiologist: Lauree Chandler, MD  Chart reviewed as part of pre-operative protocol coverage. Given past medical history and time since last visit, based on ACC/AHA guidelines, Joel [name] would be at acceptable risk for the planned procedure without further cardiovascular testing.   He may hold his aspirin for 5-7 days prior to his procedure.  Please resume as soon as hemostasis is achieved.  I will route this recommendation to the requesting party via Epic fax function and remove from pre-op pool.  Please call with questions.  Jossie Ng. Joel Arons NP-C    11/26/2019, 3:19 PM [place] [address] Office [phone] Fax [phone]

## 2019-12-06 ENCOUNTER — Other Ambulatory Visit: Payer: Self-pay | Admitting: Family Medicine

## 2019-12-06 NOTE — Telephone Encounter (Signed)
Left detailed message that pt needs to call for his annual wellness appt soon with [place] and pt needs to call cardiology to get this med refilled from them

## 2019-12-06 NOTE — Telephone Encounter (Signed)
Pt was notified about mediciation and was advised he needed an appt and said it was too much going on he couldn't schedule right now

## 2019-12-06 NOTE — Telephone Encounter (Signed)
Does this need to come from cardiology?

## 2019-12-06 NOTE — Telephone Encounter (Signed)
I have not seen him in several months. He has been seeing his cardiologist. I will defer to them for his HTN medications.

## 2019-12-07 ENCOUNTER — Other Ambulatory Visit: Payer: Self-pay | Admitting: Cardiovascular Disease

## 2019-12-07 MED ORDER — AMLODIPINE BESYLATE 10 MG PO TABS: 10.0000 mg | ORAL_TABLET | Freq: Every day | ORAL | 3 refills | Status: DC

## 2019-12-07 NOTE — Telephone Encounter (Signed)
*  STAT* If patient is at the pharmacy, call can be transferred to refill team.   1. Which medications need to be refilled? (please list name of each medication and dose if known) amLODipine (NORVASC) 10 MG tablet  2. Which pharmacy/location (including street and city if local pharmacy) is medication to be sent to? [facility] [phone] - [place], [address] AT [place]  3. Do they need a 30 day or 90 day supply? [place]

## 2019-12-10 ENCOUNTER — Other Ambulatory Visit (HOSPITAL_COMMUNITY)
Admission: RE | Admit: 2019-12-10 | Discharge: 2019-12-10 | Disposition: A | Discharge status: Home / Self Care | Payer: Medicare HMO | Source: Ambulatory Visit | Attending: Ophthalmology | Admitting: Ophthalmology

## 2019-12-10 ENCOUNTER — Other Ambulatory Visit: Payer: Self-pay | Admitting: Ophthalmology

## 2019-12-10 DIAGNOSIS — H05222 Edema of left orbit: Secondary | ICD-10-CM | POA: Diagnosis not present

## 2019-12-10 DIAGNOSIS — C8519 Unspecified B-cell lymphoma, extranodal and solid organ sites: Secondary | ICD-10-CM | POA: Diagnosis not present

## 2019-12-10 DIAGNOSIS — H0589 Other disorders of orbit: Secondary | ICD-10-CM | POA: Diagnosis not present

## 2019-12-10 DIAGNOSIS — H05112 Granuloma of left orbit: Secondary | ICD-10-CM | POA: Diagnosis not present

## 2019-12-10 DIAGNOSIS — H04162 Lacrimal gland dislocation, left lacrimal gland: Secondary | ICD-10-CM | POA: Diagnosis not present

## 2019-12-12 ENCOUNTER — Other Ambulatory Visit: Payer: Self-pay | Admitting: Family Medicine

## 2019-12-12 LAB — SURGICAL PATHOLOGY

## 2019-12-12 NOTE — Telephone Encounter (Signed)
Pt was notified last week that all HTN meds needs to come from cardiology

## 2019-12-13 IMAGING — DX DG CHEST 2V
3 series · 3 of 3 positions shown · non-contrast
Comparison: 12/08/2018

CLINICAL DATA: Shortness of breath

EXAM:
CHEST - 2 VIEW

[chest pa (1 of 2)]
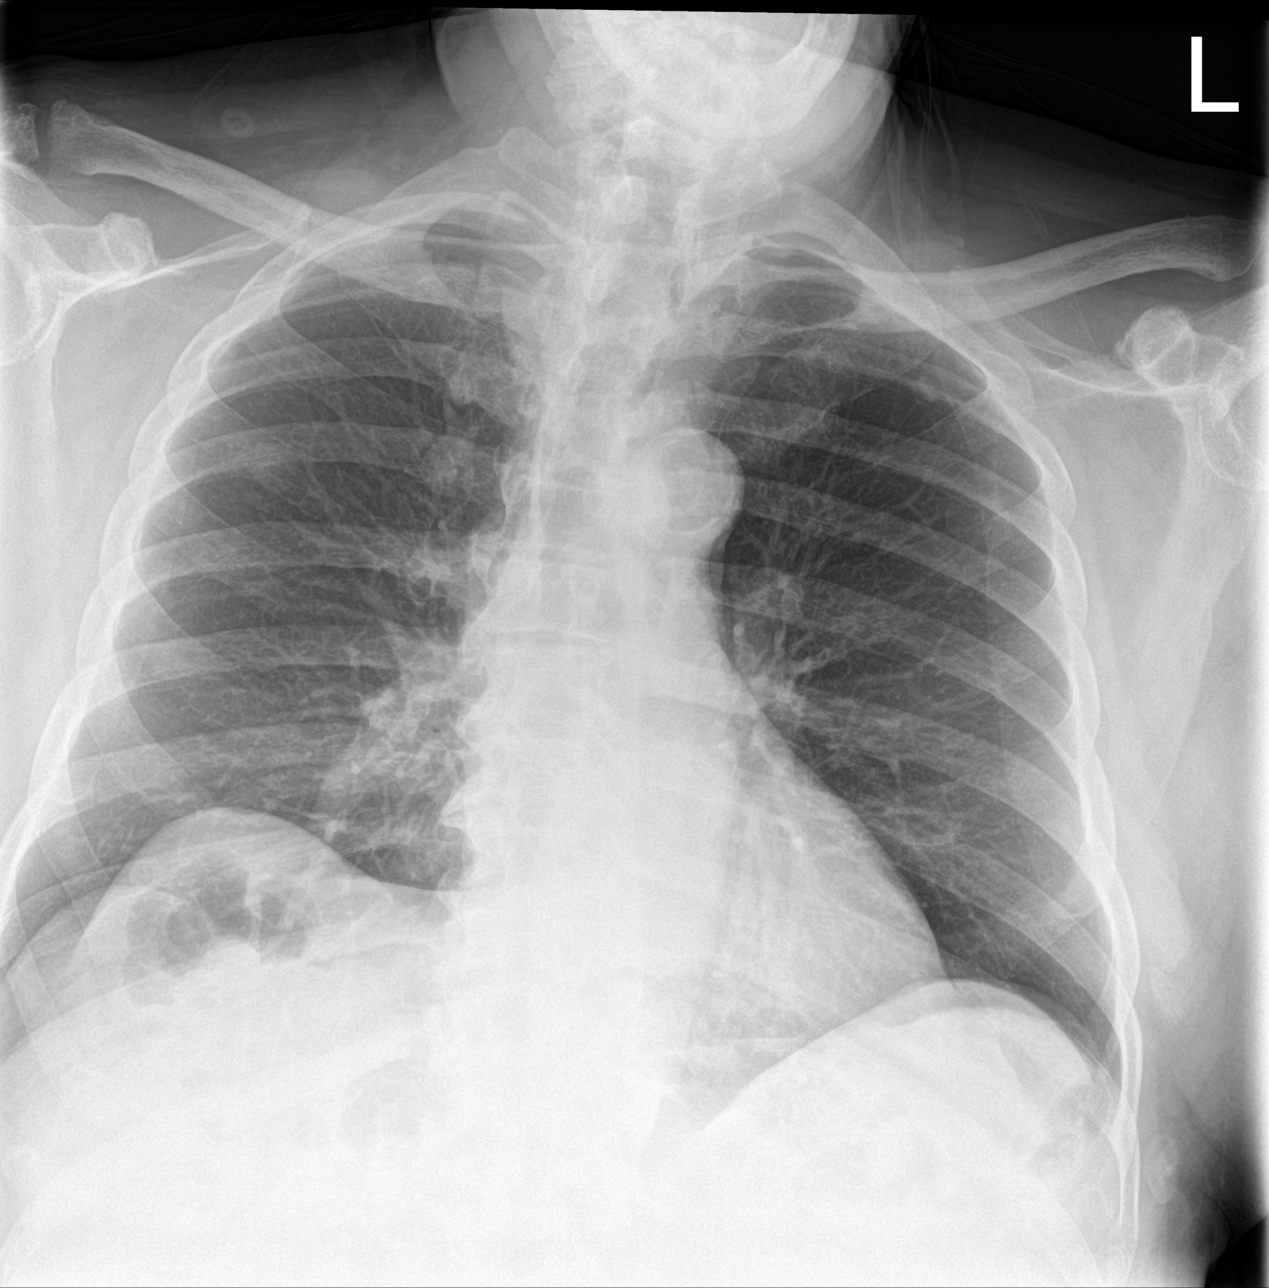

[chest lat]
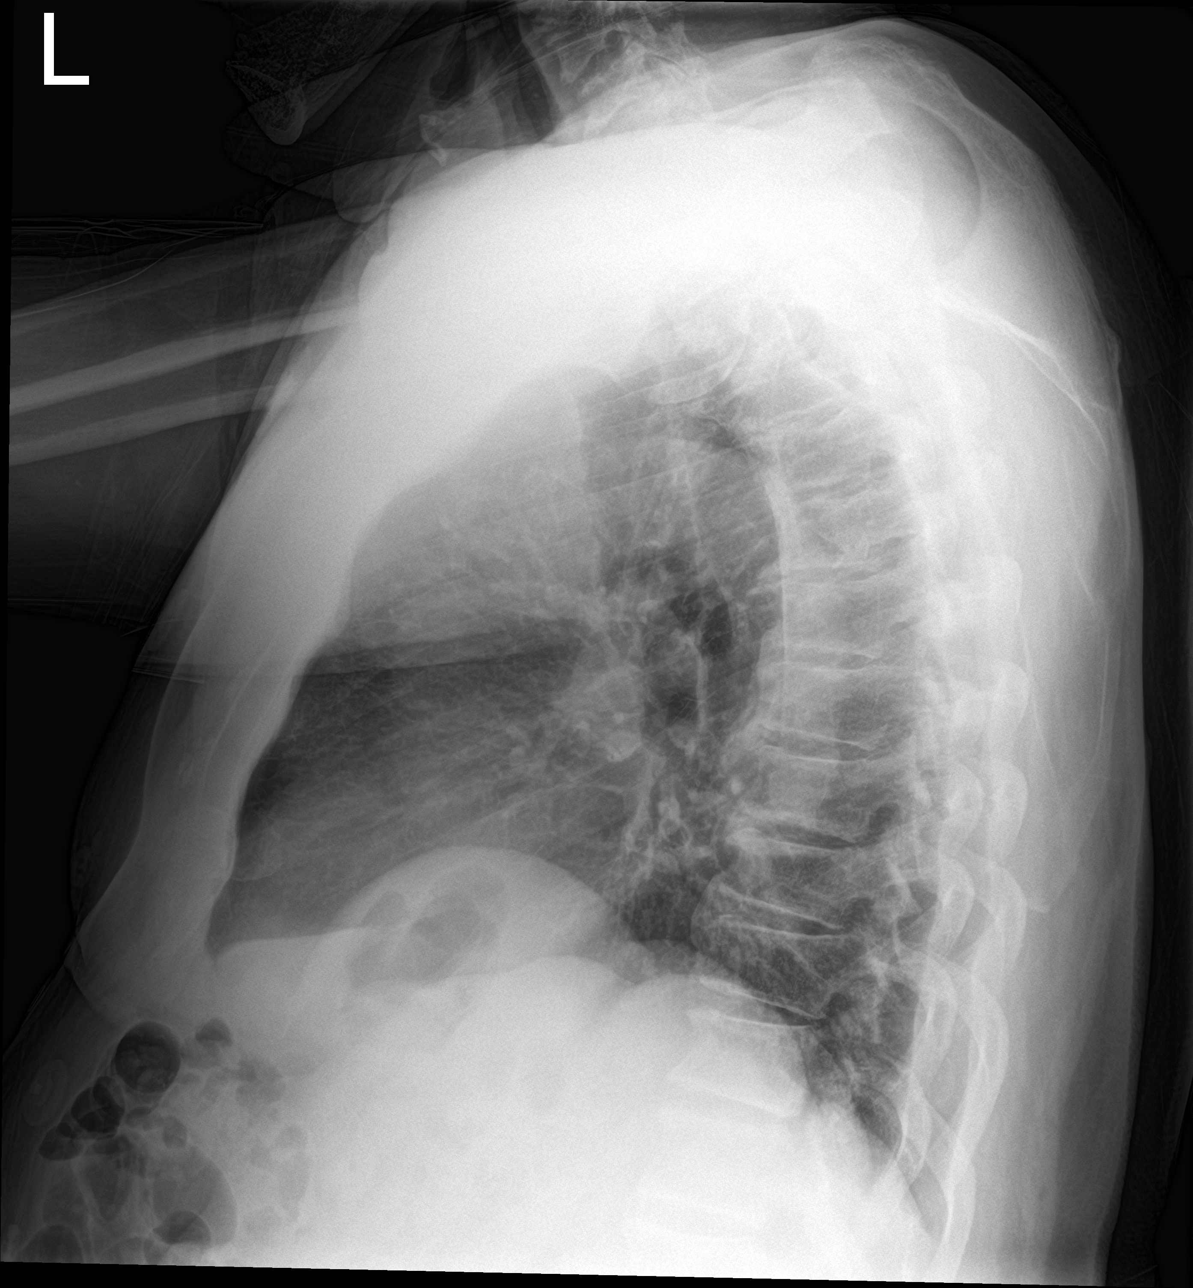

[chest pa (2 of 2)]
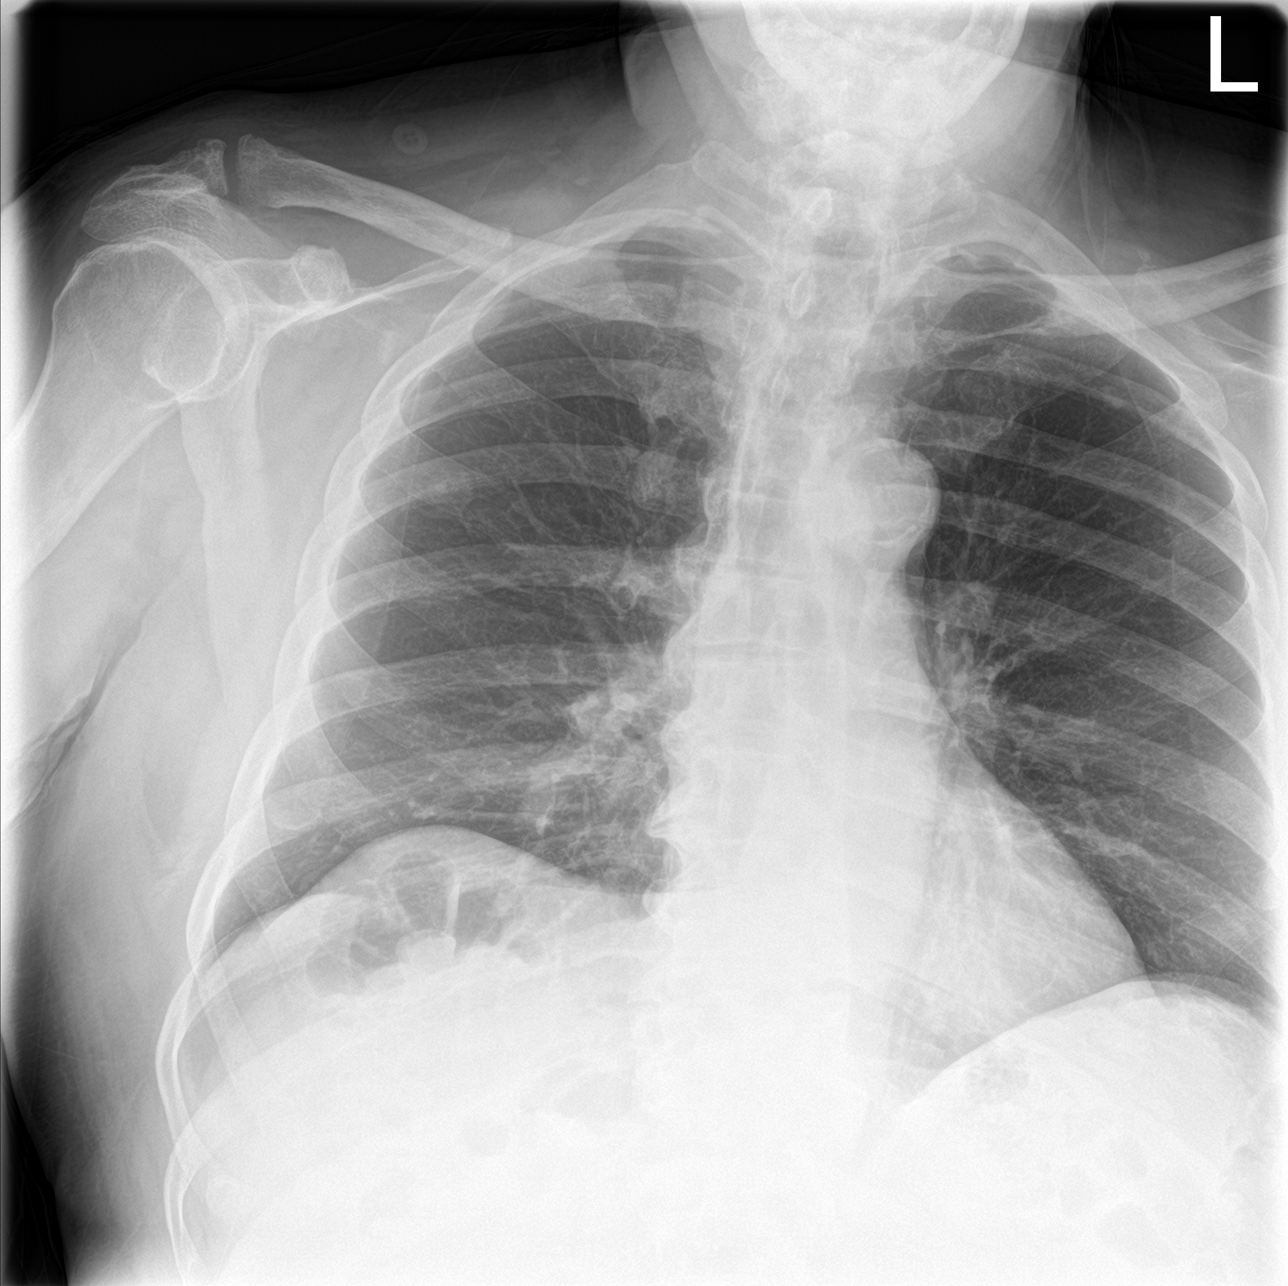

[3 of 3 positions shown; findings below may reference images not displayed]

FINDINGS: Heart and mediastinal contours are within normal limits. No focal
opacities or effusions. No acute bony abnormality.
IMPRESSION: No active cardiopulmonary disease.

## 2019-12-14 ENCOUNTER — Telehealth: Payer: Self-pay | Admitting: Cardiovascular Disease

## 2019-12-14 ENCOUNTER — Other Ambulatory Visit: Payer: Self-pay | Admitting: Medical

## 2019-12-14 ENCOUNTER — Other Ambulatory Visit: Payer: Self-pay | Admitting: Family Medicine

## 2019-12-14 ENCOUNTER — Telehealth: Payer: Self-pay | Admitting: Medical

## 2019-12-14 NOTE — Telephone Encounter (Signed)
I called pt to inform him that his PCP has been refilling this medication and pt stated that his PCP said that they turned it back over to Dr. Angelena Form to refill. Would Dr. Angelena Form like to refill this medication? Please address

## 2019-12-14 NOTE — Telephone Encounter (Signed)
Pt. Called stating that he needs a refill on his Losartan/hctz which is been being filled by [name]. He said he has been out of the medication for two days and really needs it refilled. He called his heart doctor's office and they would not fill it and told him he would have to contact his PCP office to get refilled. I told him Vickie and Sab are not in the office today but I could try sending it to you for approval. Pt. Last apt was 08/13/19.

## 2019-12-14 NOTE — Telephone Encounter (Signed)
*  STAT* If patient is at the pharmacy, call can be transferred to refill team.   1. Which medications need to be refilled? (please list name of each medication and dose if known)  losartan-hydrochlorothiazide (HYZAAR) 100-25 MG tablet  2. Which pharmacy/location (including street and city if local pharmacy) is medication to be sent to? [facility] [phone] - [place], [place] AT [place]  3. Do they need a 30 day or 90 day supply? 90 day supply  Has been out of medication for two days.

## 2019-12-14 NOTE — Telephone Encounter (Signed)
Pt. Aware of refill and has been scheduled for a CPE with Vickie.

## 2019-12-14 NOTE — Telephone Encounter (Signed)
This was filled today by PCP.

## 2019-12-14 NOTE — Telephone Encounter (Signed)
I refilled, get in for f/u with Vickie

## 2019-12-24 DIAGNOSIS — M47816 Spondylosis without myelopathy or radiculopathy, lumbar region: Secondary | ICD-10-CM | POA: Diagnosis not present

## 2019-12-26 DIAGNOSIS — C8511 Unspecified B-cell lymphoma, lymph nodes of head, face, and neck: Secondary | ICD-10-CM | POA: Diagnosis not present

## 2019-12-26 DIAGNOSIS — Z77098 Contact with and (suspected) exposure to other hazardous, chiefly nonmedicinal, chemicals: Secondary | ICD-10-CM | POA: Diagnosis not present

## 2019-12-26 DIAGNOSIS — C61 Malignant neoplasm of prostate: Secondary | ICD-10-CM | POA: Diagnosis not present

## 2019-12-26 DIAGNOSIS — Z79899 Other long term (current) drug therapy: Secondary | ICD-10-CM | POA: Diagnosis not present

## 2019-12-26 DIAGNOSIS — Z7952 Long term (current) use of systemic steroids: Secondary | ICD-10-CM | POA: Diagnosis not present

## 2019-12-26 DIAGNOSIS — Z192 Hormone resistant malignancy status: Secondary | ICD-10-CM | POA: Diagnosis not present

## 2019-12-28 DIAGNOSIS — C8519 Unspecified B-cell lymphoma, extranodal and solid organ sites: Secondary | ICD-10-CM | POA: Diagnosis not present

## 2020-01-07 DIAGNOSIS — M47816 Spondylosis without myelopathy or radiculopathy, lumbar region: Secondary | ICD-10-CM | POA: Diagnosis not present

## 2020-01-10 ENCOUNTER — Encounter: Payer: Medicare HMO | Admitting: Family Medicine

## 2020-01-10 DIAGNOSIS — C8511 Unspecified B-cell lymphoma, lymph nodes of head, face, and neck: Secondary | ICD-10-CM | POA: Diagnosis not present

## 2020-01-10 DIAGNOSIS — C8510 Unspecified B-cell lymphoma, unspecified site: Secondary | ICD-10-CM | POA: Diagnosis not present

## 2020-01-10 DIAGNOSIS — C884 Extranodal marginal zone b-cell lymphoma of mucosa-associated lymphoid tissue (malt-lymphoma) not having achieved remission: Secondary | ICD-10-CM | POA: Insufficient documentation

## 2020-01-16 DIAGNOSIS — C8301 Small cell B-cell lymphoma, lymph nodes of head, face, and neck: Secondary | ICD-10-CM | POA: Diagnosis not present

## 2020-01-16 DIAGNOSIS — Z87891 Personal history of nicotine dependence: Secondary | ICD-10-CM | POA: Diagnosis not present

## 2020-01-16 DIAGNOSIS — D509 Iron deficiency anemia, unspecified: Secondary | ICD-10-CM | POA: Diagnosis not present

## 2020-01-16 DIAGNOSIS — C8510 Unspecified B-cell lymphoma, unspecified site: Secondary | ICD-10-CM | POA: Diagnosis not present

## 2020-01-28 ENCOUNTER — Ambulatory Visit (INDEPENDENT_AMBULATORY_CARE_PROVIDER_SITE_OTHER): Payer: Medicare HMO | Admitting: Family Medicine

## 2020-01-28 ENCOUNTER — Encounter: Payer: Self-pay | Admitting: Family Medicine

## 2020-01-28 ENCOUNTER — Other Ambulatory Visit: Payer: Self-pay

## 2020-01-28 VITALS — BP 110/58 | HR 81 | Temp 98.3°F | Wt 247.2 lb

## 2020-01-28 DIAGNOSIS — L02212 Cutaneous abscess of back [any part, except buttock]: Secondary | ICD-10-CM | POA: Diagnosis not present

## 2020-01-28 MED ORDER — SULFAMETHOXAZOLE-TRIMETHOPRIM 800-160 MG PO TABS: 1.0000 | ORAL_TABLET | Freq: Two times a day (BID) | ORAL | 0 refills | Status: DC

## 2020-01-28 NOTE — Progress Notes (Signed)
   Subjective:    [name], male    DOB: [birth year], 79 y.o.   MRN: [id number]  HPI Chief Complaint  Patient presents with  . boil    boil on back busted yesterday and drained and wants it looked at,    He is here for a boil on his upper back for the past several days.  States yesterday when he leaned back against the chair, he felt it burst and fluid came out.  He denies a history of MRSA.  No history of diabetes. Denies fever, chills, nausea, vomiting.   Review of Systems Pertinent positives and negatives in the history of present illness.     Objective:   Physical Exam Constitutional:      General: He is not in acute distress.    Appearance: Normal appearance. He is not ill-appearing.  Pulmonary:     Effort: Pulmonary effort is normal.  Skin:    General: Skin is warm and dry.          Comments: 0.5 cm diameter wound with surrounding erythema. Induration and fluctuation present prior to procedure.   Neurological:     Mental Status: He is alert.    BP (!) 110/58   Pulse 81   Temp 98.3 F (36.8 C)   Wt 247 lb 3.2 oz (112.1 kg)   BMI 36.51 kg/m        Assessment & Plan:  Abscess of back - Plan: sulfamethoxazole-trimethoprim (BACTRIM DS) 800-160 MG tablet, WOUND CULTURE  The area was cleaned with alcohol and Betadine.  A significant amount of purulent drainage was expressed from the area. The wound was then irrigated and packed with 1/4 inch sterile Iodoform and a bandaid was placed on top. He was advised to leave the packing in until he return here in less than 48 hours. Wound culture sent. He will call or come in sooner if he has any signs of infection. I will also cover him with an oral antibiotic.

## 2020-01-28 NOTE — Patient Instructions (Signed)
Incision and Drainage, Care After This sheet gives you information about how to care for yourself after your procedure. Your health care provider may also give you more specific instructions. If you have problems or questions, contact your health care provider. What can I expect after the procedure? After the procedure, it is common to have:  Pain or discomfort around the incision site.  Blood, fluid, or pus (drainage) from the incision.  Redness and firm skin around the incision site. Follow these instructions at home: Medicines  Take over-the-counter and prescription medicines only as told by your health care provider.  If you were prescribed an antibiotic medicine, use or take it as told by your health care provider. Do not stop using the antibiotic even if you start to feel better. Wound care Follow instructions from your health care provider about how to take care of your wound. Make sure you:  Wash your hands with soap and water before and after you change your bandage (dressing). If soap and water are not available, use hand sanitizer.  Change your dressing and packing as told by your health care provider. ? If your dressing is dry or stuck when you try to remove it, moisten or wet the dressing with saline or water so that it can be removed without harming your skin or tissues. ? If your wound is packed, leave it in place until your health care provider tells you to remove it. To remove the packing, moisten or wet the packing with saline or water so that it can be removed without harming your skin or tissues.  Leave stitches (sutures), skin glue, or adhesive strips in place. These skin closures may need to stay in place for 2 weeks or longer. If adhesive strip edges start to loosen and curl up, you may trim the loose edges. Do not remove adhesive strips completely unless your health care provider tells you to do that. Check your wound every day for signs of infection. Check  for:  More redness, swelling, or pain.  More fluid or blood.  Warmth.  Pus or a bad smell. If you were sent home with a drain tube in place, follow instructions from your health care provider about:  How to empty it.  How to care for it at home.  General instructions  Rest the affected area.  Do not take baths, swim, or use a hot tub until your health care provider approves. Ask your health care provider if you may take showers. You may only be allowed to take sponge baths.  Return to your normal activities as told by your health care provider. Ask your health care provider what activities are safe for you. Your health care provider may put you on activity or lifting restrictions.  The incision will continue to drain. It is normal to have some clear or slightly bloody drainage. The amount of drainage should lessen each day.  Do not apply any creams, ointments, or liquids unless you have been told to by your health care provider.  Keep all follow-up visits as told by your health care provider. This is important. Contact a health care provider if:  Your cyst or abscess returns.  You have a fever or chills.  You have more redness, swelling, or pain around your incision.  You have more fluid or blood coming from your incision.  Your incision feels warm to the touch.  You have pus or a bad smell coming from your incision.  You have red streaks   above or below the incision site. Get help right away if:  You have severe pain or bleeding.  You cannot eat or drink without vomiting.  You have decreased urine output.  You become short of breath.  You have chest pain.  You cough up blood.  The affected area becomes numb or starts to tingle. These symptoms may represent a serious problem that is an emergency. Do not wait to see if the symptoms will go away. Get medical help right away. Call your local emergency services (911 in the [place] Do not drive yourself to the  hospital. Summary  After this procedure, it is common to have fluid, blood, or pus coming from the surgery site.  Follow all home care instructions. You will be told how to take care of your incision, how to check for infection, and how to take medicines.  If you were prescribed an antibiotic medicine, take it as told by your health care provider. Do not stop taking the antibiotic even if you start to feel better.  Contact a health care provider if you have increased redness, swelling, or pain around your incision. Get help right away if you have chest pain, you vomit, you cough up blood, or you have shortness of breath.  Keep all follow-up visits as told by your health care provider. This is important. This information is not intended to replace advice given to you by your health care provider. Make sure you discuss any questions you have with your health care provider. Document Revised: 02/27/2018 Document Reviewed: 02/27/2018 Elsevier Patient Education  2020 Elsevier Inc.   

## 2020-01-30 ENCOUNTER — Other Ambulatory Visit: Payer: Self-pay

## 2020-01-30 ENCOUNTER — Encounter: Payer: Self-pay | Admitting: Family Medicine

## 2020-01-30 ENCOUNTER — Ambulatory Visit: Payer: Medicare HMO | Admitting: Family Medicine

## 2020-01-30 VITALS — BP 120/58 | HR 75 | Temp 98.0°F

## 2020-01-30 DIAGNOSIS — L02212 Cutaneous abscess of back [any part, except buttock]: Secondary | ICD-10-CM

## 2020-01-30 NOTE — Progress Notes (Signed)
   Subjective:    [name], male    DOB: 05-03-1940, 79 y.o.   MRN: [id number]  HPI Chief Complaint  Patient presents with  . follow-up   He is here for a 48 hour follow up on an abscess on his upper back. Denies any issues and no fever, chills, N/V/D.    Review of Systems Pertinent positives and negatives in the history of present illness.     Objective:   Physical Exam BP (!) 120/58   Pulse 75   Temp 98 F (36.7 C)   Wound bed with healthy appearing tissue and no sign of infection.       Assessment & Plan:  Abscess of back  Packing removed and area is healing well. He will use bacitracin and have the water hit the area in the shower. Follow up as needed

## 2020-01-31 LAB — WOUND CULTURE: Organism ID, Bacteria: NONE SEEN

## 2020-02-06 DIAGNOSIS — Z803 Family history of malignant neoplasm of breast: Secondary | ICD-10-CM | POA: Diagnosis not present

## 2020-02-06 DIAGNOSIS — C884 Extranodal marginal zone B-cell lymphoma of mucosa-associated lymphoid tissue [MALT-lymphoma]: Secondary | ICD-10-CM | POA: Diagnosis not present

## 2020-02-06 DIAGNOSIS — Z79899 Other long term (current) drug therapy: Secondary | ICD-10-CM | POA: Diagnosis not present

## 2020-02-06 DIAGNOSIS — Z87891 Personal history of nicotine dependence: Secondary | ICD-10-CM | POA: Diagnosis not present

## 2020-02-06 DIAGNOSIS — R599 Enlarged lymph nodes, unspecified: Secondary | ICD-10-CM | POA: Diagnosis not present

## 2020-02-10 ENCOUNTER — Other Ambulatory Visit: Payer: Self-pay

## 2020-02-10 ENCOUNTER — Observation Stay (HOSPITAL_COMMUNITY): Payer: Medicare HMO

## 2020-02-10 ENCOUNTER — Encounter (HOSPITAL_COMMUNITY): Payer: Self-pay

## 2020-02-10 ENCOUNTER — Emergency Department (HOSPITAL_COMMUNITY): Payer: Medicare HMO

## 2020-02-10 ENCOUNTER — Observation Stay (HOSPITAL_COMMUNITY)
Admission: EM | Admit: 2020-02-10 | Discharge: 2020-02-11 | Disposition: A | Discharge status: Home / Self Care | Payer: Medicare HMO | Attending: Student | Admitting: Student

## 2020-02-10 DIAGNOSIS — R0789 Other chest pain: Secondary | ICD-10-CM | POA: Diagnosis not present

## 2020-02-10 DIAGNOSIS — E119 Type 2 diabetes mellitus without complications: Secondary | ICD-10-CM | POA: Insufficient documentation

## 2020-02-10 DIAGNOSIS — Z79899 Other long term (current) drug therapy: Secondary | ICD-10-CM | POA: Diagnosis not present

## 2020-02-10 DIAGNOSIS — N182 Chronic kidney disease, stage 2 (mild): Secondary | ICD-10-CM | POA: Diagnosis not present

## 2020-02-10 DIAGNOSIS — R1013 Epigastric pain: Secondary | ICD-10-CM | POA: Diagnosis not present

## 2020-02-10 DIAGNOSIS — I129 Hypertensive chronic kidney disease with stage 1 through stage 4 chronic kidney disease, or unspecified chronic kidney disease: Secondary | ICD-10-CM | POA: Diagnosis not present

## 2020-02-10 DIAGNOSIS — Z7982 Long term (current) use of aspirin: Secondary | ICD-10-CM | POA: Diagnosis not present

## 2020-02-10 DIAGNOSIS — I2510 Atherosclerotic heart disease of native coronary artery without angina pectoris: Secondary | ICD-10-CM | POA: Diagnosis not present

## 2020-02-10 DIAGNOSIS — Z87891 Personal history of nicotine dependence: Secondary | ICD-10-CM | POA: Insufficient documentation

## 2020-02-10 DIAGNOSIS — I10 Essential (primary) hypertension: Secondary | ICD-10-CM

## 2020-02-10 DIAGNOSIS — R079 Chest pain, unspecified: Secondary | ICD-10-CM | POA: Diagnosis present

## 2020-02-10 DIAGNOSIS — D509 Iron deficiency anemia, unspecified: Secondary | ICD-10-CM | POA: Diagnosis present

## 2020-02-10 DIAGNOSIS — Z20822 Contact with and (suspected) exposure to covid-19: Secondary | ICD-10-CM | POA: Diagnosis not present

## 2020-02-10 DIAGNOSIS — Z8546 Personal history of malignant neoplasm of prostate: Secondary | ICD-10-CM | POA: Insufficient documentation

## 2020-02-10 LAB — CBC
HCT: 29.7 % — ABNORMAL LOW (ref 39.0–52.0)
HCT: 30.7 % — ABNORMAL LOW (ref 39.0–52.0)
Hemoglobin: 8.6 g/dL — ABNORMAL LOW (ref 13.0–17.0)
Hemoglobin: 8.9 g/dL — ABNORMAL LOW (ref 13.0–17.0)
MCH: 20.1 pg — ABNORMAL LOW (ref 26.0–34.0)
MCH: 20.3 pg — ABNORMAL LOW (ref 26.0–34.0)
MCHC: 29 g/dL — ABNORMAL LOW (ref 30.0–36.0)
MCHC: 29 g/dL — ABNORMAL LOW (ref 30.0–36.0)
MCV: 69.5 fL — ABNORMAL LOW (ref 80.0–100.0)
MCV: 70 fL — ABNORMAL LOW (ref 80.0–100.0)
Platelets: 228 10*3/uL (ref 150–400)
Platelets: 253 10*3/uL (ref 150–400)
RBC: 4.24 MIL/uL (ref 4.22–5.81)
RBC: 4.42 MIL/uL (ref 4.22–5.81)
RDW: 18.6 % — ABNORMAL HIGH (ref 11.5–15.5)
RDW: 18.9 % — ABNORMAL HIGH (ref 11.5–15.5)
WBC: 6.7 10*3/uL (ref 4.0–10.5)
WBC: 7.4 10*3/uL (ref 4.0–10.5)
nRBC: 0 % (ref 0.0–0.2)
nRBC: 0 % (ref 0.0–0.2)

## 2020-02-10 LAB — TROPONIN I (HIGH SENSITIVITY)
Troponin I (High Sensitivity): 6 ng/L (ref <18)
Troponin I (High Sensitivity): 7 ng/L (ref <18)

## 2020-02-10 LAB — HEPATIC FUNCTION PANEL
ALT: 15 U/L (ref 0–44)
AST: 12 U/L — ABNORMAL LOW (ref 15–41)
Albumin: 3.7 g/dL (ref 3.5–5.0)
Alkaline Phosphatase: 71 U/L (ref 38–126)
Bilirubin, Direct: <0.1 mg/dL (ref 0.0–0.2)
Total Bilirubin: 0.4 mg/dL (ref 0.3–1.2)
Total Protein: 6.6 g/dL (ref 6.5–8.1)

## 2020-02-10 LAB — BASIC METABOLIC PANEL
Anion gap: 11 (ref 5–15)
BUN: 17 mg/dL (ref 8–23)
CO2: 26 mmol/L (ref 22–32)
Calcium: 9.5 mg/dL (ref 8.9–10.3)
Chloride: 101 mmol/L (ref 98–111)
Creatinine, Ser: 1.21 mg/dL (ref 0.61–1.24)
GFR, Estimated: >60 mL/min (ref >60)
Glucose, Bld: 89 mg/dL (ref 70–99)
Potassium: 3.8 mmol/L (ref 3.5–5.1)
Sodium: 138 mmol/L (ref 135–145)

## 2020-02-10 LAB — RESPIRATORY PANEL BY RT PCR (FLU A&B, COVID)
Influenza A by PCR: NEGATIVE
Influenza B by PCR: NEGATIVE
SARS Coronavirus 2 by RT PCR: NEGATIVE

## 2020-02-10 LAB — LIPASE, BLOOD: Lipase: 45 U/L (ref 11–51)

## 2020-02-10 MED ORDER — LOSARTAN POTASSIUM 50 MG PO TABS
100.0000 mg | ORAL_TABLET | Freq: Every day | ORAL | Status: DC
  Administered 2020-02-11: 100 mg via ORAL
  Filled 2020-02-10: qty 2

## 2020-02-10 MED ORDER — AMLODIPINE BESYLATE 5 MG PO TABS
10.0000 mg | ORAL_TABLET | Freq: Every day | ORAL | Status: DC
  Administered 2020-02-11: 10 mg via ORAL
  Filled 2020-02-10: qty 2

## 2020-02-10 MED ORDER — HYDROCHLOROTHIAZIDE 25 MG PO TABS
25.0000 mg | ORAL_TABLET | Freq: Every day | ORAL | Status: DC
  Administered 2020-02-11: 25 mg via ORAL
  Filled 2020-02-10: qty 1

## 2020-02-10 MED ORDER — PANTOPRAZOLE SODIUM 40 MG PO TBEC
40.0000 mg | DELAYED_RELEASE_TABLET | Freq: Every day | ORAL | Status: DC
  Administered 2020-02-11: 40 mg via ORAL
  Filled 2020-02-10: qty 1

## 2020-02-10 MED ORDER — ASPIRIN EC 81 MG PO TBEC
81.0000 mg | DELAYED_RELEASE_TABLET | Freq: Every day | ORAL | Status: DC
  Administered 2020-02-11: 81 mg via ORAL
  Filled 2020-02-10: qty 1

## 2020-02-10 MED ORDER — GABAPENTIN 600 MG PO TABS
600.0000 mg | ORAL_TABLET | Freq: Every day | ORAL | Status: DC
  Administered 2020-02-10: 600 mg via ORAL
  Filled 2020-02-10 (×2): qty 1

## 2020-02-10 MED ORDER — ABIRATERONE ACETATE 250 MG PO TABS: 1000.0000 mg | ORAL_TABLET | Freq: Every day | ORAL | Status: DC

## 2020-02-10 MED ORDER — TRAMADOL HCL 50 MG PO TABS: 50.0000 mg | ORAL_TABLET | Freq: Three times a day (TID) | ORAL | Status: DC | PRN

## 2020-02-10 MED ORDER — ENOXAPARIN SODIUM 40 MG/0.4ML ~~LOC~~ SOLN
40.0000 mg | SUBCUTANEOUS | Status: DC
  Administered 2020-02-11: 40 mg via SUBCUTANEOUS
  Filled 2020-02-10: qty 0.4

## 2020-02-10 MED ORDER — CYCLOSPORINE 0.05 % OP EMUL
1.0000 [drp] | Freq: Two times a day (BID) | OPHTHALMIC | Status: DC
  Administered 2020-02-11: 1 [drp] via OPHTHALMIC
  Filled 2020-02-10 (×2): qty 1

## 2020-02-10 MED ORDER — LOSARTAN POTASSIUM-HCTZ 100-25 MG PO TABS: 1.0000 | ORAL_TABLET | Freq: Every day | ORAL | Status: DC

## 2020-02-10 MED ORDER — ISOSORBIDE MONONITRATE ER 30 MG PO TB24
30.0000 mg | ORAL_TABLET | Freq: Every day | ORAL | Status: DC
  Administered 2020-02-10: 30 mg via ORAL
  Filled 2020-02-10: qty 1

## 2020-02-10 MED ORDER — LORATADINE 10 MG PO TABS
10.0000 mg | ORAL_TABLET | Freq: Every day | ORAL | Status: DC
  Administered 2020-02-11: 10 mg via ORAL
  Filled 2020-02-10: qty 1

## 2020-02-10 MED ORDER — PREDNISONE 5 MG PO TABS
5.0000 mg | ORAL_TABLET | Freq: Every day | ORAL | Status: DC
  Filled 2020-02-10: qty 1

## 2020-02-10 MED ORDER — NITROGLYCERIN 0.4 MG SL SUBL: 0.4000 mg | SUBLINGUAL_TABLET | SUBLINGUAL | Status: DC | PRN

## 2020-02-10 MED ORDER — ALBUTEROL SULFATE HFA 108 (90 BASE) MCG/ACT IN AERS: 2.0000 | INHALATION_SPRAY | RESPIRATORY_TRACT | Status: DC | PRN

## 2020-02-10 MED ORDER — FERROUS SULFATE 325 (65 FE) MG PO TABS: 325.0000 mg | ORAL_TABLET | ORAL | Status: DC

## 2020-02-10 NOTE — ED Triage Notes (Signed)
Pt presents to ED for onset 1pm epigastric/abd pain.  Pt reports it "feels like I got to burp and my whole stomach feels like it is burning".  Abd pain improved but still has epigastric pain.  Denies dizziness, shortness of breath, nausea, syncope, arm or jaw pain.   Took NTG x 3 with no relief.

## 2020-02-10 NOTE — H&P (Signed)
History and Physical    Joel [name] BJS:[id number] DOB: Aug 19, 1940 DOA: 02/10/2020  PCP: Girtha Rm, NP-C  Patient coming from: Home.  Chief Complaint: Epigastric pain lower sternal chest pain.  HPI: Joel [name] is a 79 y.o. male with history of CAD status post stenting and peripheral vascular disease, recently diagnosed orbital tumor being followed at [facility] had received rituximab last week for non-Hodgkin's lymphoma involving the orbit with chronic anemia and hypertension presents to the ER because of having epigastric pain and lower sternal pain.  Patient states he went to the church this morning and after coming back he started out having epigastric discomfort with no associated shortness of breath diaphoresis nausea vomiting or abdominal pain.  This lasted for around 2 hours and was not relieved despite taking sublingual nitroglycerin 3 doses.  He presents to the ER.  The pain resolved without any intervention.  ED Course: Chest x-ray EKG and a high sensitive cardiac markers were unremarkable.  Given the cardiac history patient admitted for possible angina.  Pain.  On exam abdomen appears benign LFTs lipase are normal.  Sono abdomen did not show any acute.  Labs are significant for hemoglobin of 8.9 which appears to be chronic.  Covid test was negative.  Review of Systems: As per HPI, rest all negative.   Past Medical History:  Diagnosis Date  . Atypical chest pain   . CAD (coronary artery disease)    a. Multiple prior evaluations then 05/2012 s/p BMS to OM2. b. Nuc 11/2012 wnl. c. Cath 2015: patent stent, moderate LAD/RCA dz, treated medically.  . Cancer ([place])    Hx: of prostate  . Cataract   . CKD (chronic kidney disease), stage II   . COPD (chronic obstructive pulmonary disease) ([place])   . Cough secondary to angiotensin converting enzyme inhibitor (ACE-I)   . Diabetes mellitus without complication ([place])   . Diverticulosis   . DVT (deep venous thrombosis)  ([place])   . Family history of breast cancer in first degree relative   . GERD (gastroesophageal reflux disease)   . Headache(784.0)    Hx: of  . History of blood transfusion    "I've had 2; don't know what it was related to" (11/09/2012)  . HLD (hyperlipidemia)   . HTN (hypertension)   . Iron deficiency anemia   . Microcytic anemia   . Peripheral vascular disease ([place])    a. L pop-tibial bypass 2011, followed by [facility].  . Personal history of prostate cancer   . Rotator cuff injury    "left arm; never repaired" (11/09/2012)    Past Surgical History:  Procedure Laterality Date  . CARDIOVASCULAR STRESS TEST  Aug. 2014  . CATARACT EXTRACTION W/ INTRAOCULAR LENS IMPLANT Right ~ 2008  . CIRCUMCISION    . COLONOSCOPY    . CORONARY ANGIOPLASTY WITH STENT PLACEMENT  2014   "1" (11/09/2012)  . ILIAC ARTERY ANEURYSM REPAIR    . LEFT HEART CATH  05-31-12  . LEFT HEART CATH AND CORONARY ANGIOGRAPHY N/A 08/01/2019   Procedure: LEFT HEART CATH AND CORONARY ANGIOGRAPHY;  Surgeon: Burnell Blanks, MD;  Location: [facility];  Service: Cardiovascular;  Laterality: N/A;  . LEFT HEART CATHETERIZATION WITH CORONARY ANGIOGRAM N/A 06/01/2011   Procedure: LEFT HEART CATHETERIZATION WITH CORONARY ANGIOGRAM;  Surgeon: Hillary Bow, MD;  Location: [facility] CATH LAB;  Service: Cardiovascular;  Laterality: N/A;  . LEFT HEART CATHETERIZATION WITH CORONARY ANGIOGRAM N/A 05/31/2012   Procedure: LEFT HEART  CATHETERIZATION WITH CORONARY ANGIOGRAM;  Surgeon: Peter M Martinique, MD;  Location: [facility] CATH LAB;  Service: Cardiovascular;  Laterality: N/A;  . LEFT HEART CATHETERIZATION WITH CORONARY ANGIOGRAM N/A 08/01/2013   Procedure: LEFT HEART CATHETERIZATION WITH CORONARY ANGIOGRAM;  Surgeon: Burnell Blanks, MD;  Location: [facility] CATH LAB;  Service: Cardiovascular;  Laterality: N/A;  . PERCUTANEOUS CORONARY STENT INTERVENTION (PCI-S)  05/31/2012   Procedure: PERCUTANEOUS CORONARY STENT INTERVENTION (PCI-S);  Surgeon:  Peter M Martinique, MD;  Location: [facility] CATH LAB;  Service: Cardiovascular;;  . PR VEIN BYPASS GRAFT,AORTO-FEM-POP Left 10/01/2009   left below knee popliteal artery to posterior tibial artery  . SHOULDER ARTHROSCOPY WITH OPEN ROTATOR CUFF REPAIR AND DISTAL CLAVICLE ACROMINECTOMY Left 01/12/2013   Procedure: LEFT SHOULDER ARTHROSCOPY WITH DEBRIDEMENT OPEN DISTAL CLAVICLE RESECTION ,acromioplastyAND ROTATOR CUFF REPAIR;  Surgeon: Yvette Rack., MD;  Location: [facility];  Service: Orthopedics;  Laterality: Left;     reports that he quit smoking about 8 years ago. His smoking use included cigarettes. He has a 26.00 pack-year smoking history. He has never used smokeless tobacco. He reports that he does not drink alcohol and does not use drugs.  Allergies  Allergen Reactions  . Topiramate Shortness Of Breath and Other (See Comments)     leg cramps  . Atenolol Other (See Comments)    bradycardia  . Lisinopril Cough  . Tramadol-Acetaminophen Cough    Tolerates plain Tramadol    Family History  Problem Relation Age of Onset  . Breast cancer Mother        dx 36s  . Hyperlipidemia Mother   . Hypertension Mother   . COPD Father        deceased  . Hyperlipidemia Father   . Hypertension Father   . Hypertension Sister   . Cancer Brother 3       Unknown cancer, possibly stomach  . Hypertension Brother   . Cancer Brother 24       Unknown, possibly lung  . Hypertension Daughter   . Hypertension Son   . Colon cancer Neg Hx        pt is unclear about family hx    Prior to Admission medications   Medication Sig Start Date End Date Taking? Authorizing Provider  abiraterone acetate (ZYTIGA) 250 MG tablet Take 1,000 mg by mouth daily.    Yes [provider]  acetaminophen (TYLENOL) 500 MG tablet Take 1,000 mg by mouth every 6 (six) hours as needed for mild pain or headache.   Yes [provider]  albuterol (VENTOLIN HFA) 108 (90 Base) MCG/ACT inhaler INHALE 2 PUFFS INTO THE LUNGS  EVERY 6 HOURS AS NEEDED FOR WHEEZING OR SHORTNESS OF BREATH Patient taking differently: Inhale 2 puffs into the lungs every 4 (four) hours as needed for wheezing or shortness of breath.  03/15/19  Yes Henson, Vickie L, NP-C  amLODipine (NORVASC) 10 MG tablet Take 1 tablet (10 mg total) by mouth daily. 12/07/19  Yes Burnell Blanks, MD  aspirin EC 81 MG tablet Take 81 mg by mouth daily.    Yes [provider]  ferrous sulfate 325 (65 FE) MG tablet Take 325 mg by mouth once a week. Friday   Yes [provider]  gabapentin (NEURONTIN) 600 MG tablet Take 600 mg by mouth at bedtime.    Yes [provider]  isosorbide mononitrate (IMDUR) 30 MG 24 hr tablet Take 1 tablet (30 mg total) by mouth daily. Patient taking differently: Take 30 mg  by mouth at bedtime.  07/09/19  Yes Burnell Blanks, MD  loratadine (CLARITIN) 10 MG tablet Take 10 mg by mouth daily.   Yes [provider]  losartan-hydrochlorothiazide (HYZAAR) 100-25 MG tablet TAKE 1 TABLET BY MOUTH DAILY 12/14/19  Yes Henson, Vickie L, NP-C  nitroGLYCERIN (NITROSTAT) 0.4 MG SL tablet PLACE 1 TABLET UNDER THE TONGUE EVERY 5 MINUTES AS NEEDED FOR CHEST PAIN Patient taking differently: Place 0.4 mg under the tongue every 5 (five) minutes as needed for chest pain.  07/18/19  Yes Rosita Fire, Brittainy M, PA-C  omeprazole (PRILOSEC) 20 MG capsule Take 20 mg by mouth daily.    Yes [provider]  predniSONE (DELTASONE) 5 MG tablet Take 5 mg by mouth daily.    Yes [provider]  RESTASIS 0.05 % ophthalmic emulsion Place 1 drop into both eyes 2 (two) times daily. 08/31/19  Yes [provider]  sulfamethoxazole-trimethoprim (BACTRIM DS) 800-160 MG tablet Take 1 tablet by mouth 2 (two) times daily. 01/28/20  Yes Henson, Vickie L, NP-C  traMADol (ULTRAM) 50 MG tablet TAKE 1 TABLET(50 MG) BY MOUTH EVERY 8 HOURS AS NEEDED FOR SEVERE PAIN Patient taking differently: Take 50 mg by mouth every 8  (eight) hours as needed for moderate pain.  08/03/19  Yes Henson, Vickie L, NP-C  Accu-Chek FastClix Lancets MISC Test once daily 11/23/18   Henson, Vickie L, NP-C  pravastatin (PRAVACHOL) 40 MG tablet Take 1 tablet (40 mg total) by mouth every evening. Patient not taking: Reported on 02/10/2020 04/25/19   Burnell Blanks, MD    Physical Exam: Constitutional: Moderately built and nourished. Vitals:   02/10/20 2000 02/10/20 2100 02/10/20 2120 02/10/20 2200  BP: 133/68 (!) 163/126 (!) 155/76 133/63  Pulse: 65 62 63 66  Resp: 12 14 13 16   Temp:      TempSrc:      SpO2: 100% 98% 100% 99%  Weight:      Height:       Eyes: Left eyelid is swollen. ENMT: No discharge from the ears eyes nose or mouth. Neck: No mass felt.  No neck rigidity. Respiratory: No rhonchi or crepitations. Cardiovascular: S1-S2 heard. Abdomen: Soft nontender bowel sounds present. Musculoskeletal: No edema. Skin: No rash. Neurologic: Alert awake oriented to time place and person.  Moves all extremities. Psychiatric: Appears normal.  Normal affect.   Labs on Admission: I have personally reviewed following labs and imaging studies  CBC: Recent Labs  Lab 02/10/20 1713  WBC 7.4  HGB 8.9*  HCT 30.7*  MCV 69.5*  PLT 749   Basic Metabolic Panel: Recent Labs  Lab 02/10/20 1713  NA 138  K 3.8  CL 101  CO2 26  GLUCOSE 89  BUN 17  CREATININE 1.21  CALCIUM 9.5   GFR: Estimated Creatinine Clearance: 60.1 mL/min (by C-G formula based on SCr of 1.21 mg/dL). Liver Function Tests: Recent Labs  Lab 02/10/20 1833  AST 12*  ALT 15  ALKPHOS 71  BILITOT 0.4  PROT 6.6  ALBUMIN 3.7   Recent Labs  Lab 02/10/20 1833  LIPASE 45   No results for input(s): AMMONIA in the last 168 hours. Coagulation Profile: No results for input(s): INR, PROTIME in the last 168 hours. Cardiac Enzymes: No results for input(s): CKTOTAL, CKMB, CKMBINDEX, TROPONINI in the last 168 hours. BNP (last 3 results) No results  for input(s): PROBNP in the last 8760 hours. HbA1C: No results for input(s): HGBA1C in the last 72 hours. CBG: No results  for input(s): GLUCAP in the last 168 hours. Lipid Profile: No results for input(s): CHOL, HDL, LDLCALC, TRIG, CHOLHDL, LDLDIRECT in the last 72 hours. Thyroid Function Tests: No results for input(s): TSH, T4TOTAL, FREET4, T3FREE, THYROIDAB in the last 72 hours. Anemia Panel: No results for input(s): VITAMINB12, FOLATE, FERRITIN, TIBC, IRON, RETICCTPCT in the last 72 hours. Urine analysis:    Component Value Date/Time   COLORURINE STRAW (A) 04/04/2016 0107   APPEARANCEUR CLEAR 04/04/2016 0107   LABSPEC 1.015 08/24/2017 0946   PHURINE 6.0 04/04/2016 0107   GLUCOSEU NEGATIVE 04/04/2016 0107   HGBUR NEGATIVE 04/04/2016 0107   BILIRUBINUR negative 08/24/2017 0946   BILIRUBINUR n 09/10/2016 1415   KETONESUR negative 08/24/2017 0946   KETONESUR NEGATIVE 04/04/2016 0107   PROTEINUR negative 08/24/2017 0946   PROTEINUR n 09/10/2016 1415   PROTEINUR NEGATIVE 04/04/2016 0107   UROBILINOGEN negative (A) 09/10/2016 1415   UROBILINOGEN 0.2 06/15/2012 1124   NITRITE Negative 08/24/2017 0946   NITRITE n 09/10/2016 1415   NITRITE NEGATIVE 04/04/2016 0107   LEUKOCYTESUR Negative 08/24/2017 0946   Sepsis Labs: @LABRCNTIP (procalcitonin:4,lacticidven:4) ) Recent Results (from the past 240 hour(s))  Respiratory Panel by RT PCR (Flu A&B, Covid) - Nasopharyngeal Swab     Status: None   Collection Time: 02/10/20  8:24 PM   Specimen: Nasopharyngeal Swab  Result Value Ref Range Status   SARS Coronavirus 2 by RT PCR NEGATIVE NEGATIVE Final    Comment: (NOTE) SARS-CoV-2 target nucleic acids are NOT DETECTED.  The SARS-CoV-2 RNA is generally detectable in upper respiratoy specimens during the acute phase of infection. The lowest concentration of SARS-CoV-2 viral copies this assay can detect is 131 copies/mL. A negative result does not preclude SARS-Cov-2 infection and should  not be used as the sole basis for treatment or other patient management decisions. A negative result may occur with  improper specimen collection/handling, submission of specimen other than nasopharyngeal swab, presence of viral mutation(s) within the areas targeted by this assay, and inadequate number of viral copies (<131 copies/mL). A negative result must be combined with clinical observations, patient history, and epidemiological information. The expected result is Negative.  Fact Sheet for Patients:  PinkCheek.be  Fact Sheet for Healthcare Providers:  GravelBags.it  This test is no t yet approved or cleared by the [place] FDA and  has been authorized for detection and/or diagnosis of SARS-CoV-2 by FDA under an Emergency Use Authorization (EUA). This EUA will remain  in effect (meaning this test can be used) for the duration of the COVID-19 declaration under Section 564(b)(1) of the Act, 21 U.S.C. section 360bbb-3(b)(1), unless the authorization is terminated or revoked sooner.     Influenza A by PCR NEGATIVE NEGATIVE Final   Influenza B by PCR NEGATIVE NEGATIVE Final    Comment: (NOTE) The Xpert Xpress SARS-CoV-2/FLU/RSV assay is intended as an aid in  the diagnosis of influenza from Nasopharyngeal swab specimens and  should not be used as a sole basis for treatment. Nasal washings and  aspirates are unacceptable for Xpert Xpress SARS-CoV-2/FLU/RSV  testing.  Fact Sheet for Patients: PinkCheek.be  Fact Sheet for Healthcare Providers: GravelBags.it  This test is not yet approved or cleared by the [place] FDA and  has been authorized for detection and/or diagnosis of SARS-CoV-2 by  FDA under an Emergency Use Authorization (EUA). This EUA will remain  in effect (meaning this test can be used) for the duration of the  Covid-19 declaration under  Section 564(b)(1) of the Act, 21  U.S.C. section 360bbb-3(b)(1), unless the authorization is  terminated or revoked. Performed at [facility], [place] [address], [place], [place] [address]      Radiological Exams on Admission: DG Chest 2 View  Result Date: 02/10/2020 CLINICAL DATA:  Chest pain. EXAM: CHEST - 2 VIEW COMPARISON:  10/02/2019 FINDINGS: The heart size and mediastinal contours are within normal limits. Both lungs are clear. The visualized skeletal structures are unremarkable. IMPRESSION: No active cardiopulmonary disease. Electronically Signed   By: Nolon Nations M.D.   On: 02/10/2020 17:28    EKG: Independently reviewed.  Normal sinus rhythm.  Assessment/Plan Principal Problem:   Chest pain Active Problems:   Iron deficiency anemia   Essential hypertension   CAD (coronary artery disease)    1. Chest pain/epigastric pain concerning for angina presently chest pain-free.  His pain was not relieved by sublingual nitroglycerin.  Presently will admit overnight consult cardiology continue patient's home dose of antiplatelet agents, Imdur.  Patient not on statins will check with cardiology. 2. Hypertension on amlodipine and hydrochlorothiazide ARB and Imdur. 3. Chronic iron deficiency anemia hemoglobin appears to be at baseline on iron supplements. 4. Recently diagnosed left orbital non-Hodgkin's lymphoma for which patient is following up with [facility] and has received rituximab last week.  Scheduled to receive rituximab every week. 5. History of peripheral vascular disease. 6. Patient is also on chronic prednisone therapy.  Reason for which is not clear.   DVT prophylaxis: Lovenox. Code Status: Full code. Family Communication: Discussed with patient. Disposition Plan: Home. Consults called: Cardiology. Admission status: Observation.   Rise Patience MD [facility] Pager [phone].  If 7PM-7AM, please contact  night-coverage [email or web address] Password [facility]  02/10/2020, 10:32 PM

## 2020-02-10 NOTE — ED Provider Notes (Signed)
[facility] Provider Note   CSN: [id number] Arrival date & time: 02/10/20  1653     History Chief Complaint  Patient presents with  . Chest Pain    Joel [name] is a 79 y.o. male with PMHx CAD s/p stents, COPD, DM, CKD stage II, GERD, HTN, HLD, who presents to the ED today with complaint of gradual onset, constant, gradually improving, achy, epigastric/substernal chest pain that began around 1 PM today. Pt reports that he had gotten home from church today and was cooking his dinner when he began having some pain. He states it felt like heart burn so he took some OTC medications including alka seltzer without relief. He then proceeded to take 2-3 NTG without relief prompting him to come to the ED. Pt states that when the pain started his scale was 7/10 and now it is currently a 1/10. He denies any other symptoms including fevers, chills, diaphoresis, shortness of breath, nausea, vomiting, or any other associated symptoms. Last normal bowel movement earlier this morning. No hx of abdominal surgeries.  Cardiac Cath 08/01/19   Prox RCA lesion is 40% stenosed.  Mid RCA lesion is 40% stenosed.  Mid RCA to Dist RCA lesion is 50% stenosed.  RPDA lesion is 70% stenosed.  3rd RPL lesion is 70% stenosed.  Prox Cx to Dist Cx lesion is 40% stenosed.  Prox LAD to Mid LAD lesion is 40% stenosed.  Mid LAD lesion is 40% stenosed.  Dist LAD lesion is 70% stenosed.  The left ventricular systolic function is normal.  LV end diastolic pressure is normal.  The left ventricular ejection fraction is 55-65% by visual estimate.  There is no mitral valve regurgitation.   1. Moderate non-obstructive disease in the LAD and Circumflex 2. Moderate non-obstructive disease in the RCA. Severe disease in the small caliber PDA, too small for PCI 3. Normal LV systolic function   The history is provided by the patient and medical records.       Past  Medical History:  Diagnosis Date  . Atypical chest pain   . CAD (coronary artery disease)    a. Multiple prior evaluations then 05/2012 s/p BMS to OM2. b. Nuc 11/2012 wnl. c. Cath 2015: patent stent, moderate LAD/RCA dz, treated medically.  . Cancer ([place])    Hx: of prostate  . Cataract   . CKD (chronic kidney disease), stage II   . COPD (chronic obstructive pulmonary disease) ([place])   . Cough secondary to angiotensin converting enzyme inhibitor (ACE-I)   . Diabetes mellitus without complication ([place])   . Diverticulosis   . DVT (deep venous thrombosis) ([place])   . Family history of breast cancer in first degree relative   . GERD (gastroesophageal reflux disease)   . Headache(784.0)    Hx: of  . History of blood transfusion    "I've had 2; don't know what it was related to" (11/09/2012)  . HLD (hyperlipidemia)   . HTN (hypertension)   . Iron deficiency anemia   . Microcytic anemia   . Peripheral vascular disease ([place])    a. L pop-tibial bypass 2011, followed by [facility].  . Personal history of prostate cancer   . Rotator cuff injury    "left arm; never repaired" (11/09/2012)    Patient Active Problem List   Diagnosis Date Noted  . Chest pain 02/10/2020  . Chest pain of uncertain etiology   . Sleep related hypoxia 04/03/2019  . Obesity (BMI 30-39.9) 04/03/2019  .  Snoring 04/03/2019  . At risk for sleep apnea 01/10/2019  . Excessive daytime sleepiness 01/10/2019  . Chronic low back pain 09/11/2018  . COPD (chronic obstructive pulmonary disease) ([place])   . Seasonal allergies 07/04/2018  . Chronic obstructive pulmonary disease ([place]) 07/04/2018  . Dizziness 01/20/2018  . Malaise 01/20/2018  . Decreased breath sounds 01/20/2018  . Needs flu shot 01/20/2018  . Chronic nonintractable headache 12/07/2017  . Back pain 11/01/2017  . Radicular pain of lower extremity 11/01/2017  . Prostate cancer ([place]) 11/01/2017  . Family history of breast cancer in first degree relative   . Personal  history of prostate cancer   . Elliptocytosis on peripheral blood smear ([place]) 09/13/2016  . Prediabetes 03/22/2016  . PVD (peripheral vascular disease) with claudication ([place]) 11/15/2012  . Chest pain, localized 11/10/2012  . Hypokalemia 11/10/2012  . Bradycardia 11/09/2012  . Thrombocytopenia ([place]) 06/01/2012  . Microcytic anemia   . Atherosclerosis of native arteries of the extremities with intermittent claudication 06/16/2011  . Stable angina ([place]) 06/11/2011  . CAD (coronary artery disease)   . Chest pain, atypical 05/30/2011  . Iron deficiency anemia 10/03/2009  . DIVERTICULOSIS OF COLON 10/03/2009  . WEIGHT LOSS, ABNORMAL 10/03/2009  . CHEST PAIN UNSPECIFIED 09/13/2008  . Headache(784.0) 08/19/2008  . TIA 06/05/2008  . SHOULDER PAIN, LEFT 10/11/2007  . COUGH DUE TO ACE INHIBITORS 05/07/2007  . History of tobacco use disorder 04/25/2007  . Hyperlipidemia 04/21/2007  . Essential hypertension 04/21/2007  . GERD 04/21/2007    Past Surgical History:  Procedure Laterality Date  . CARDIOVASCULAR STRESS TEST  Aug. 2014  . CATARACT EXTRACTION W/ INTRAOCULAR LENS IMPLANT Right ~ 2008  . CIRCUMCISION    . COLONOSCOPY    . CORONARY ANGIOPLASTY WITH STENT PLACEMENT  2014   "1" (11/09/2012)  . ILIAC ARTERY ANEURYSM REPAIR    . LEFT HEART CATH  05-31-12  . LEFT HEART CATH AND CORONARY ANGIOGRAPHY N/A 08/01/2019   Procedure: LEFT HEART CATH AND CORONARY ANGIOGRAPHY;  Surgeon: Burnell Blanks, MD;  Location: [facility];  Service: Cardiovascular;  Laterality: N/A;  . LEFT HEART CATHETERIZATION WITH CORONARY ANGIOGRAM N/A 06/01/2011   Procedure: LEFT HEART CATHETERIZATION WITH CORONARY ANGIOGRAM;  Surgeon: Hillary Bow, MD;  Location: [facility] CATH LAB;  Service: Cardiovascular;  Laterality: N/A;  . LEFT HEART CATHETERIZATION WITH CORONARY ANGIOGRAM N/A 05/31/2012   Procedure: LEFT HEART CATHETERIZATION WITH CORONARY ANGIOGRAM;  Surgeon: Peter M Martinique, MD;  Location[facility] CATH  LAB;  Service: Cardiovascular;  Laterality: N/A;  . LEFT HEART CATHETERIZATION WITH CORONARY ANGIOGRAM N/A 08/01/2013   Procedure: LEFT HEART CATHETERIZATION WITH CORONARY ANGIOGRAM;  Surgeon: Burnell Blanks, MD;  Location: [facility] CATH LAB;  Service: Cardiovascular;  Laterality: N/A;  . PERCUTANEOUS CORONARY STENT INTERVENTION (PCI-S)  05/31/2012   Procedure: PERCUTANEOUS CORONARY STENT INTERVENTION (PCI-S);  Surgeon: Peter M Martinique, MD;  Location: [facility] CATH LAB;  Service: Cardiovascular;;  . PR VEIN BYPASS GRAFT,AORTO-FEM-POP Left 10/01/2009   left below knee popliteal artery to posterior tibial artery  . SHOULDER ARTHROSCOPY WITH OPEN ROTATOR CUFF REPAIR AND DISTAL CLAVICLE ACROMINECTOMY Left 01/12/2013   Procedure: LEFT SHOULDER ARTHROSCOPY WITH DEBRIDEMENT OPEN DISTAL CLAVICLE RESECTION ,acromioplastyAND ROTATOR CUFF REPAIR;  Surgeon: Yvette Rack., MD;  Location: [facility];  Service: Orthopedics;  Laterality: Left;       Family History  Problem Relation Age of Onset  . Breast cancer Mother        dx 36s  . Hyperlipidemia Mother   .  Hypertension Mother   . COPD Father        deceased  . Hyperlipidemia Father   . Hypertension Father   . Hypertension Sister   . Cancer Brother 56       Unknown cancer, possibly stomach  . Hypertension Brother   . Cancer Brother 47       Unknown, possibly lung  . Hypertension Daughter   . Hypertension Son   . Colon cancer Neg Hx        pt is unclear about family hx    Social History   Tobacco Use  . Smoking status: Former Smoker    Packs/day: 0.50    Years: 52.00    Pack years: 26.00    Types: Cigarettes    Quit date: 05/26/2011    Years since quitting: 8.7  . Smokeless tobacco: Never Used  Vaping Use  . Vaping Use: Never used  Substance Use Topics  . Alcohol use: No  . Drug use: No    Home Medications Prior to Admission medications   Medication Sig Start Date End Date Taking? Authorizing Provider  abiraterone acetate (ZYTIGA) 250 MG  tablet Take 1,000 mg by mouth daily. Take on an empty stomach 1 hour before or 2 hours after a meal  Patient not taking: Reported on 01/28/2020    [provider]  Accu-Chek FastClix Lancets MISC Test once daily 11/23/18   Henson, Vickie L, NP-C  acetaminophen (TYLENOL) 500 MG tablet Take 1,000 mg by mouth every 6 (six) hours as needed for mild pain or headache.    [provider]  albuterol (VENTOLIN HFA) 108 (90 Base) MCG/ACT inhaler INHALE 2 PUFFS INTO THE LUNGS EVERY 6 HOURS AS NEEDED FOR WHEEZING OR SHORTNESS OF BREATH Patient taking differently: Inhale 2 puffs into the lungs every 4 (four) hours as needed for wheezing or shortness of breath.  03/15/19   Henson, Vickie L, NP-C  amLODipine (NORVASC) 10 MG tablet Take 1 tablet (10 mg total) by mouth daily. 12/07/19   Burnell Blanks, MD  aspirin EC 81 MG tablet Take 81 mg by mouth daily.     [provider]  brimonidine (ALPHAGAN) 0.2 % ophthalmic solution Place 1 drop into both eyes 2 (two) times daily.  09/13/17   [provider]  ferrous sulfate 325 (65 FE) MG tablet Take 325 mg by mouth once a week. Friday    [provider]  gabapentin (NEURONTIN) 600 MG tablet Take 600 mg by mouth every other day.     [provider]  isosorbide mononitrate (IMDUR) 30 MG 24 hr tablet Take 1 tablet (30 mg total) by mouth daily. 07/09/19   Burnell Blanks, MD  latanoprost (XALATAN) 0.005 % ophthalmic solution Place 1 drop into the right eye at bedtime. 05/24/19   [provider]  losartan-hydrochlorothiazide (HYZAAR) 100-25 MG tablet TAKE 1 TABLET BY MOUTH DAILY 12/14/19   Henson, Vickie L, NP-C  nitroGLYCERIN (NITROSTAT) 0.4 MG SL tablet PLACE 1 TABLET UNDER THE TONGUE EVERY 5 MINUTES AS NEEDED FOR CHEST PAIN Patient taking differently: Place 0.4 mg under the tongue every 5 (five) minutes as needed for chest pain.  07/18/19   Lyda Jester M, PA-C  omeprazole (PRILOSEC) 20 MG capsule  Take 20 mg by mouth daily.     [provider]  pravastatin (PRAVACHOL) 40 MG tablet Take 1 tablet (40 mg total) by mouth every evening. 04/25/19   Burnell Blanks, MD  predniSONE (DELTASONE) 5 MG  tablet Take 5 mg by mouth daily.     [provider]  sulfamethoxazole-trimethoprim (BACTRIM DS) 800-160 MG tablet Take 1 tablet by mouth 2 (two) times daily. 01/28/20   Henson, Vickie L, NP-C  traMADol (ULTRAM) 50 MG tablet TAKE 1 TABLET(50 MG) BY MOUTH EVERY 8 HOURS AS NEEDED FOR SEVERE PAIN 08/03/19   Raenette Rover, Vickie L, NP-C    Allergies    Topiramate, Atenolol, Lisinopril, and Tramadol-acetaminophen  Review of Systems   Review of Systems  Constitutional: Negative for chills and fever.  Respiratory: Negative for cough and shortness of breath.   Cardiovascular: Positive for chest pain.  Gastrointestinal: Positive for abdominal pain. Negative for constipation, diarrhea, nausea and vomiting.  All other systems reviewed and are negative.   Physical Exam Updated Vital Signs BP (!) 146/70 (BP Location: Right Arm)   Pulse 73   Temp 98.7 F (37.1 C) (Oral)   Resp 16   Ht 5\' 9"  (1.753 m)   Wt 112.1 kg   SpO2 97%   BMI 36.50 kg/m   Physical Exam Vitals and nursing note reviewed.  Constitutional:      Appearance: He is obese. He is not ill-appearing or diaphoretic.  HENT:     Head: Normocephalic and atraumatic.  Eyes:     Conjunctiva/sclera: Conjunctivae normal.  Cardiovascular:     Rate and Rhythm: Normal rate and regular rhythm.     Pulses:          Radial pulses are 2+ on the right side and 2+ on the left side.       Dorsalis pedis pulses are 2+ on the right side and 2+ on the left side.     Heart sounds: Normal heart sounds.  Pulmonary:     Effort: Pulmonary effort is normal.     Breath sounds: Normal breath sounds. No decreased breath sounds, wheezing, rhonchi or rales.  Chest:     Chest wall: No tenderness.  Abdominal:     Palpations: Abdomen is soft.      Tenderness: There is no abdominal tenderness. There is no guarding or rebound.  Musculoskeletal:     Cervical back: Neck supple.     Right lower leg: No edema.     Left lower leg: No edema.  Skin:    General: Skin is warm and dry.  Neurological:     Mental Status: He is alert.     ED Results / Procedures / Treatments   Labs (all labs ordered are listed, but only abnormal results are displayed) Labs Reviewed  CBC - Abnormal; Notable for the following components:      Result Value   Hemoglobin 8.9 (*)    HCT 30.7 (*)    MCV 69.5 (*)    MCH 20.1 (*)    MCHC 29.0 (*)    RDW 18.6 (*)    All other components within normal limits  HEPATIC FUNCTION PANEL - Abnormal; Notable for the following components:   AST 12 (*)    All other components within normal limits  RESPIRATORY PANEL BY RT PCR (FLU A&B, COVID)  BASIC METABOLIC PANEL  LIPASE, BLOOD  TROPONIN I (HIGH SENSITIVITY)  TROPONIN I (HIGH SENSITIVITY)    EKG EKG Interpretation  Date/Time:  Sunday February 10 2020 16:56:50 EDT Ventricular Rate:  73 PR Interval:  254 QRS Duration: 86 QT Interval:  384 QTC Calculation: 423 R Axis:   32 Text Interpretation: Sinus rhythm with 1st degree A-V block with Premature atrial complexes  with Abberant conduction Otherwise normal ECG Confirmed by Pattricia Boss [phone]) on 02/10/2020 7:33:36 PM   Radiology DG Chest 2 View  Result Date: 02/10/2020 CLINICAL DATA:  Chest pain. EXAM: CHEST - 2 VIEW COMPARISON:  10/02/2019 FINDINGS: The heart size and mediastinal contours are within normal limits. Both lungs are clear. The visualized skeletal structures are unremarkable. IMPRESSION: No active cardiopulmonary disease. Electronically Signed   By: Nolon Nations M.D.   On: 02/10/2020 17:28    Procedures Procedures (including critical care time)  Medications Ordered in ED Medications - No data to display  ED Course  I have reviewed the triage vital signs and the nursing  notes.  Pertinent labs & imaging results that were available during my care of the patient were reviewed by me and considered in my medical decision making (see chart for details).    MDM Rules/Calculators/A&P                          79 year old male who presents to the ED today with complaint of epigastric/substernal chest pain that began around 1 PM today.  Attempted multiple over-the-counter medications for GERD as well as 2-3 sublingual nitroglycerin without relief prompting him to come to the ED.  He does however report while in the waiting room his pain subsided from a 7 out of 10 to a 1 out of 10.  On arrival to the ED vitals are stable.  Patient is afebrile, nontachycardic nontachypneic and appears to be in no acute distress. EKG without acute ischemic changes. CXR clear. CBC without leukocytosis. Hgb stable at 8.9. BMP without electrolyte abnormalities. Troponin of 6. In the setting of epigastric pain will plan for LFTs and lipase as well. Will repeat troponin however does not sound like ACS at this time.   Hemoglobin  Date Value Ref Range Status  02/10/2020 8.9 (L) 13.0 - 17.0 g/dL Final    Comment:    Reticulocyte Hemoglobin testing may be clinically indicated, consider ordering this additional test [id number]   10/02/2019 9.0 (L) 13.0 - 17.0 g/dL Final  07/27/2019 10.3 (L) 13.0 - 17.7 g/dL Final  07/09/2019 9.7 (L) 13.0 - 17.0 g/dL Final  02/27/2019 10.2 (L) 13.0 - 17.0 g/dL Final  01/10/2019 8.3 (L) 13.0 - 17.7 g/dL Final  08/24/2017 11.3 (L) 13.0 - 17.7 g/dL Final   HGB  Date Value Ref Range Status  11/12/2016 11.3 (L) 13.0 - 17.1 g/dL Final  09/30/2016 10.8 (L) 13.0 - 17.1 g/dL Final   LFts unremarkable Lipase 45 Repeat troponin 7  Pt is currently pain free however given his CAD/stening history do feel he needs admission for observation and chest pain work up. Dr. Radford Pax with cardiology agrees with medical admission.   Discussed case with [facility] Dr.  Hal Hope who agrees to evaluate patient for admission  This note was prepared using Dragon voice recognition software and may include unintentional dictation errors due to the inherent limitations of voice recognition software.  Final Clinical Impression(s) / ED Diagnoses Final diagnoses:  Atypical chest pain    Rx / DC Orders ED Discharge Orders    None       Eustaquio Maize, PA-C 02/10/20 2019    Pattricia Boss, MD 02/11/20 1426

## 2020-02-11 DIAGNOSIS — R0789 Other chest pain: Secondary | ICD-10-CM | POA: Diagnosis not present

## 2020-02-11 DIAGNOSIS — I2510 Atherosclerotic heart disease of native coronary artery without angina pectoris: Secondary | ICD-10-CM | POA: Diagnosis not present

## 2020-02-11 DIAGNOSIS — I10 Essential (primary) hypertension: Secondary | ICD-10-CM | POA: Diagnosis not present

## 2020-02-11 DIAGNOSIS — D509 Iron deficiency anemia, unspecified: Secondary | ICD-10-CM

## 2020-02-11 DIAGNOSIS — R079 Chest pain, unspecified: Secondary | ICD-10-CM | POA: Diagnosis not present

## 2020-02-11 LAB — CREATININE, SERUM
Creatinine, Ser: 1.14 mg/dL (ref 0.61–1.24)
GFR, Estimated: >60 mL/min (ref >60)

## 2020-02-11 LAB — TROPONIN I (HIGH SENSITIVITY)
Troponin I (High Sensitivity): 6 ng/L (ref <18)
Troponin I (High Sensitivity): 7 ng/L (ref <18)

## 2020-02-11 NOTE — Consult Note (Addendum)
Cardiology Consultation:   [name]; [id number]; 05/09/1940   Admit date: 02/10/2020 Date of Consult: 02/11/2020  Primary Care Provider: Girtha Rm, NP-C Primary Cardiologist: Dr. Lauree Chandler,    Patient Profile:   Joel [name] is a 79 y.o. male with a hx of HTN, HLD, diabetes mellitus, non-hodgkin's lymphoma, and CAD s/p BMS (2014) who is being seen today for the evaluation of atypical chest pain at the request of Dr. Cyndia Skeeters.  History of Present Illness:   Joel [name] presents with a 24-hour history of epigastric abdominal pain started yesterday evening.  Patient states that he was drinking several diet sodas and subsequently developed epigastric pain without radiation that he characterized as pressure.  He tried to belch to relieve his pain but was unable to burp.  Additionally, he tried Alka-Seltzer and nitroglycerin (x2) without success.  Patient then decided to go to the ED for further evaluation and management.  He denied any shortness of breath, nausea, palpitations, lightheadedness, exercise intolerance, diaphoresis.  He denies any abdominal bloating or lower extremity swelling.  In the ED, the patient had EKG findings not consistent with ischemia and 2 troponins that were negative.  Patient's pain resolved while in the ED.  Patient does have a significant history of CAD status post bare-metal stent to OM2 in 2014.  Recent left heart cath on 07/2019 showed patent stent with residual moderate disease in the RCA and LAD.  Past Medical History:  Diagnosis Date  . Atypical chest pain   . CAD (coronary artery disease)    a. Multiple prior evaluations then 05/2012 s/p BMS to OM2. b. Nuc 11/2012 wnl. c. Cath 2015: patent stent, moderate LAD/RCA dz, treated medically.  . Cancer ([place])    Hx: of prostate  . Cataract   . CKD (chronic kidney disease), stage II   . COPD (chronic obstructive pulmonary disease) ([place])   . Cough secondary to angiotensin  converting enzyme inhibitor (ACE-I)   . Diabetes mellitus without complication ([place])   . Diverticulosis   . DVT (deep venous thrombosis) ([place])   . Family history of breast cancer in first degree relative   . GERD (gastroesophageal reflux disease)   . Headache(784.0)    Hx: of  . History of blood transfusion    "I've had 2; don't know what it was related to" (11/09/2012)  . HLD (hyperlipidemia)   . HTN (hypertension)   . Iron deficiency anemia   . Microcytic anemia   . Peripheral vascular disease ([place])    a. L pop-tibial bypass 2011, followed by [facility].  . Personal history of prostate cancer   . Rotator cuff injury    "left arm; never repaired" (11/09/2012)    Past Surgical History:  Procedure Laterality Date  . CARDIOVASCULAR STRESS TEST  Aug. 2014  . CATARACT EXTRACTION W/ INTRAOCULAR LENS IMPLANT Right ~ 2008  . CIRCUMCISION    . COLONOSCOPY    . CORONARY ANGIOPLASTY WITH STENT PLACEMENT  2014   "1" (11/09/2012)  . ILIAC ARTERY ANEURYSM REPAIR    . LEFT HEART CATH  05-31-12  . LEFT HEART CATH AND CORONARY ANGIOGRAPHY N/A 08/01/2019   Procedure: LEFT HEART CATH AND CORONARY ANGIOGRAPHY;  Surgeon: Burnell Blanks, MD;  Location: [facility];  Service: Cardiovascular;  Laterality: N/A;  . LEFT HEART CATHETERIZATION WITH CORONARY ANGIOGRAM N/A 06/01/2011   Procedure: LEFT HEART CATHETERIZATION WITH CORONARY ANGIOGRAM;  Surgeon: Hillary Bow, MD;  Location: [facility] CATH LAB;  Service: Cardiovascular;  Laterality: N/A;  . LEFT HEART CATHETERIZATION WITH CORONARY ANGIOGRAM N/A 05/31/2012   Procedure: LEFT HEART CATHETERIZATION WITH CORONARY ANGIOGRAM;  Surgeon: Peter M Martinique, MD;  Location: [facility] CATH LAB;  Service: Cardiovascular;  Laterality: N/A;  . LEFT HEART CATHETERIZATION WITH CORONARY ANGIOGRAM N/A 08/01/2013   Procedure: LEFT HEART CATHETERIZATION WITH CORONARY ANGIOGRAM;  Surgeon: Burnell Blanks, MD;  Location: [facility] CATH LAB;  Service: Cardiovascular;  Laterality:  N/A;  . PERCUTANEOUS CORONARY STENT INTERVENTION (PCI-S)  05/31/2012   Procedure: PERCUTANEOUS CORONARY STENT INTERVENTION (PCI-S);  Surgeon: Peter M Martinique, MD;  Location: [facility] CATH LAB;  Service: Cardiovascular;;  . PR VEIN BYPASS GRAFT,AORTO-FEM-POP Left 10/01/2009   left below knee popliteal artery to posterior tibial artery  . SHOULDER ARTHROSCOPY WITH OPEN ROTATOR CUFF REPAIR AND DISTAL CLAVICLE ACROMINECTOMY Left 01/12/2013   Procedure: LEFT SHOULDER ARTHROSCOPY WITH DEBRIDEMENT OPEN DISTAL CLAVICLE RESECTION ,acromioplastyAND ROTATOR CUFF REPAIR;  Surgeon: Yvette Rack., MD;  Location: [facility];  Service: Orthopedics;  Laterality: Left;     Home Medications:  Prior to Admission medications   Medication Sig Start Date End Date Taking? Authorizing Provider  abiraterone acetate (ZYTIGA) 250 MG tablet Take 1,000 mg by mouth daily.    Yes [provider]  acetaminophen (TYLENOL) 500 MG tablet Take 1,000 mg by mouth every 6 (six) hours as needed for mild pain or headache.   Yes [provider]  albuterol (VENTOLIN HFA) 108 (90 Base) MCG/ACT inhaler INHALE 2 PUFFS INTO THE LUNGS EVERY 6 HOURS AS NEEDED FOR WHEEZING OR SHORTNESS OF BREATH Patient taking differently: Inhale 2 puffs into the lungs every 4 (four) hours as needed for wheezing or shortness of breath.  03/15/19  Yes Henson, Vickie L, NP-C  amLODipine (NORVASC) 10 MG tablet Take 1 tablet (10 mg total) by mouth daily. 12/07/19  Yes Burnell Blanks, MD  aspirin EC 81 MG tablet Take 81 mg by mouth daily.    Yes [provider]  ferrous sulfate 325 (65 FE) MG tablet Take 325 mg by mouth once a week. Friday   Yes [provider]  gabapentin (NEURONTIN) 600 MG tablet Take 600 mg by mouth at bedtime.    Yes [provider]  isosorbide mononitrate (IMDUR) 30 MG 24 hr tablet Take 1 tablet (30 mg total) by mouth daily. Patient taking differently: Take 30 mg by mouth at bedtime.  07/09/19  Yes Burnell Blanks, MD  loratadine (CLARITIN) 10 MG tablet Take 10 mg by mouth daily.   Yes [provider]  losartan-hydrochlorothiazide (HYZAAR) 100-25 MG tablet TAKE 1 TABLET BY MOUTH DAILY 12/14/19  Yes Henson, Vickie L, NP-C  nitroGLYCERIN (NITROSTAT) 0.4 MG SL tablet PLACE 1 TABLET UNDER THE TONGUE EVERY 5 MINUTES AS NEEDED FOR CHEST PAIN Patient taking differently: Place 0.4 mg under the tongue every 5 (five) minutes as needed for chest pain (max 3 doses).  07/18/19  Yes Rosita Fire, Brittainy M, PA-C  omeprazole (PRILOSEC) 20 MG capsule Take 20 mg by mouth daily.    Yes [provider]  predniSONE (DELTASONE) 5 MG tablet Take 5 mg by mouth daily.    Yes [provider]  RESTASIS 0.05 % ophthalmic emulsion Place 1 drop into both eyes 2 (two) times daily. 08/31/19  Yes [provider]  traMADol (ULTRAM) 50 MG tablet TAKE 1 TABLET(50 MG) BY MOUTH EVERY 8 HOURS AS NEEDED FOR SEVERE PAIN Patient taking differently: Take 50 mg by mouth every 8 (eight)  hours as needed for moderate pain.  08/03/19  Yes Henson, Vickie L, NP-C  Accu-Chek FastClix Lancets MISC Test once daily 11/23/18   Henson, Vickie L, NP-C  pravastatin (PRAVACHOL) 40 MG tablet Take 1 tablet (40 mg total) by mouth every evening. Patient not taking: Reported on 02/10/2020 04/25/19   Burnell Blanks, MD    Inpatient Medications: Scheduled Meds: . abiraterone acetate  1,000 mg Oral Daily  . amLODipine  10 mg Oral Daily  . aspirin EC  81 mg Oral Daily  . cycloSPORINE  1 drop Both Eyes BID  . enoxaparin (LOVENOX) injection  40 mg Subcutaneous Q24H  . [START ON 02/15/2020] ferrous sulfate  325 mg Oral Q Fri  . gabapentin  600 mg Oral QHS  . losartan  100 mg Oral Daily   And  . hydrochlorothiazide  25 mg Oral Daily  . isosorbide mononitrate  30 mg Oral QHS  . loratadine  10 mg Oral Daily  . pantoprazole  40 mg Oral Daily  . predniSONE  5 mg Oral Daily   Continuous Infusions:  PRN Meds: albuterol,  nitroGLYCERIN, traMADol  Allergies:    Allergies  Allergen Reactions  . Topiramate Shortness Of Breath and Other (See Comments)     leg cramps  . Atenolol Other (See Comments)    bradycardia  . Lisinopril Cough  . Tramadol-Acetaminophen Cough    Tolerates plain Tramadol    Social History:   Social History   Socioeconomic History  . Marital status: Widowed    Spouse name: Not on file  . Number of children: Not on file  . Years of education: Not on file  . Highest education level: 12th grade  Occupational History  . Occupation: Retired  Tobacco Use  . Smoking status: Former Smoker    Packs/day: 0.50    Years: 52.00    Pack years: 26.00    Types: Cigarettes    Quit date: 05/26/2011    Years since quitting: 8.7  . Smokeless tobacco: Never Used  Vaping Use  . Vaping Use: Never used  Substance and Sexual Activity  . Alcohol use: No  . Drug use: No  . Sexual activity: Not on file  Other Topics Concern  . 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  Lives in [place] by himself.      Patient writes with his right hand, though uses his left hand for every thing else. He lives alone in a one level home. He drinks 3 cups of coffee a day and 3 7-8 oz sodas a day. He does not exercise.   Social Determinants of Health   Financial Resource Strain:   . Difficulty of Paying Living Expenses: Not on file  Food Insecurity:   . Worried About Charity fundraiser in the Last Year: Not on file  . Ran Out of Food in the Last Year: Not on file  Transportation Needs:   . Lack of Transportation (Medical): Not on file  . Lack of Transportation (Non-Medical): Not on file  Physical Activity:   . Days of Exercise per Week: Not on file  . Minutes of Exercise per Session: Not on file  Stress:   . Feeling of Stress : Not on file  Social Connections:   . Frequency of Communication with Friends and Family: Not on file  . Frequency of Social Gatherings  with Friends and Family: Not on file  . Attends Religious Services:  Not on file  . Active Member of Clubs or Organizations: Not on file  . Attends Archivist Meetings: Not on file  . Marital Status: Not on file  Intimate Partner Violence:   . Fear of Current or Ex-Partner: Not on file  . Emotionally Abused: Not on file  . Physically Abused: Not on file  . Sexually Abused: Not on file    Family History:   Family History  Problem Relation Age of Onset  . Breast cancer Mother        dx 30s  . Hyperlipidemia Mother   . Hypertension Mother   . COPD Father        deceased  . Hyperlipidemia Father   . Hypertension Father   . Hypertension Sister   . Cancer Brother 76       Unknown cancer, possibly stomach  . Hypertension Brother   . Cancer Brother 72       Unknown, possibly lung  . Hypertension Daughter   . Hypertension Son   . Colon cancer Neg Hx        pt is unclear about family hx     ROS:  Please see the history of present illness.  ROS  All other ROS reviewed and negative.     Physical Exam/Data:   Vitals:   02/11/20 0200 02/11/20 0300 02/11/20 0500 02/11/20 0600  BP: 123/74 (!) 143/63 132/81 (!) 147/84  Pulse: 70 64 65 67  Resp: 15 14 11 14   Temp:      TempSrc:      SpO2: 100% 95% 91% 98%  Weight:      Height:       No intake or output data in the 24 hours ending 02/11/20 0921 Filed Weights   02/10/20 1819  Weight: 112.1 kg   Body mass index is 36.5 kg/m.  General:  Well nourished, well developed, in no acute distress HEENT: normal Lymph: no adenopathy Neck: no JVD Endocrine:  No thryomegaly Vascular: No carotid bruits; FA pulses 2+ bilaterally without bruits  Cardiac:  normal S1, S2; RRR; no murmur  Lungs:  clear to auscultation bilaterally, no wheezing, rhonchi or rales  Abd: soft, nontender, no hepatomegaly  Ext: no edema Musculoskeletal:  No deformities, BUE and BLE strength normal and equal Skin: warm and dry  Neuro:  CNs 2-12  grossly intact, no focal abnormalities noted Psych:  Normal affect   EKG:  The EKG was personally reviewed and demonstrates: Sinus rhythm with minimal change from prior EKG  Relevant CV Studies: 1. Troponin: 6>7  Laboratory Data:  Chemistry Recent Labs  Lab 02/10/20 1713 02/10/20 2330  NA 138  --   K 3.8  --   CL 101  --   CO2 26  --   GLUCOSE 89  --   BUN 17  --   CREATININE 1.21 1.14  CALCIUM 9.5  --   GFRNONAA >60 >60  ANIONGAP 11  --     Recent Labs  Lab 02/10/20 1833  PROT 6.6  ALBUMIN 3.7  AST 12*  ALT 15  ALKPHOS 71  BILITOT 0.4   Hematology Recent Labs  Lab 02/10/20 1713 02/10/20 2330  WBC 7.4 6.7  RBC 4.42 4.24  HGB 8.9* 8.6*  HCT 30.7* 29.7*  MCV 69.5* 70.0*  MCH 20.1* 20.3*  MCHC 29.0* 29.0*  RDW 18.6* 18.9*  PLT 253 228   Cardiac EnzymesNo results for input(s): TROPONINI in the last 168 hours. No results for input(s):  TROPIPOC in the last 168 hours.  BNPNo results for input(s): BNP, PROBNP in the last 168 hours.  DDimer No results for input(s): DDIMER in the last 168 hours.  Radiology/Studies:  DG Chest 2 View  Result Date: 02/10/2020 CLINICAL DATA:  Chest pain. EXAM: CHEST - 2 VIEW COMPARISON:  10/02/2019 FINDINGS: The heart size and mediastinal contours are within normal limits. Both lungs are clear. The visualized skeletal structures are unremarkable. IMPRESSION: No active cardiopulmonary disease. Electronically Signed   By: Nolon Nations M.D.   On: 02/10/2020 17:28   US Abdomen Complete  Result Date: 02/10/2020 CLINICAL DATA:  Epigastric pain EXAM: ABDOMEN ULTRASOUND COMPLETE COMPARISON:  None. FINDINGS: Gallbladder: No gallstones or wall thickening visualized. No sonographic Murphy sign noted by sonographer. Common bile duct: Diameter: Normal caliber, 5 mm Liver: 2 cysts within the liver, the largest 3.1 cm in the right hepatic lobe. Increased echotexture throughout the liver compatible with fatty infiltration. Portal vein is patent  on color Doppler imaging with normal direction of blood flow towards the liver. IVC: No abnormality visualized. Pancreas: Visualized portion unremarkable. Spleen: Size and appearance within normal limits. Right Kidney: Length: 8.4 cm. Echogenicity within normal limits. No mass or hydronephrosis visualized. Left Kidney: Length: 9.9 cm. Echogenicity within normal limits. No mass or hydronephrosis visualized. Abdominal aorta: No aneurysm visualized. Other findings: None. IMPRESSION: No acute findings. Simple appearing hepatic cysts. Mildly increased echotexture throughout the liver suggesting fatty infiltration. Electronically Signed   By: Rolm Baptise M.D.   On: 02/10/2020 23:43    Assessment and Plan:    1. Atypical Cardiac Chest pain - patient presents with acute onset atypical chest pain with a history of ischemic heart disease.  Pacifically patient has had bare-metal stent to the OM2 in 2014.  Patient has had subsequent left heart caths that have shown patent BMS and moderate residual disease in the LAD/RCA.  His most recent left heart cath was done on 07/2019.  Patient had reassuring EKG and negative troponin x2 in the ED.  Therefore, it is unlikely that the patient's chest pain is secondary to worsening ischemic heart disease. He is a patient of Dr. Angelena Form.  We will arrange follow-up for him in the cardiology clinic once discharged from the ED.  He should restart his home medications including ASA 81 mg, pravastatin 40 mg, and Imdur 30 mg daily.  2.  Hypertension -patient's blood pressure is well controlled here in the hospital.  He should be discharged on his home blood pressure medications including amlodipine 10 mg, losartan-hydrochlorothiazide 100-25 mg daily.  3.  Diabetes mellitus - had a hemoglobin A1c positive for diabetes on 12/2018 and several elevated random glucose readings since then.  Likely benefit from further evaluation and management of his diabetes in the outpatient  setting.   For questions or updates, please contact [facility] Please consult [email or web address] for contact info under Cardiology/STEMI.   Signed, Marianna Payment, MD  02/11/2020 9:21 AM  Patient seen, examined. Available data reviewed. Agree with findings, assessment, and plan as outlined by Dr Marianna Payment.  On exam the patient is an elderly male in no distress.  JVP is normal, carotid upstrokes are normal without bruits, lungs are clear, heart is regular rate and rhythm with no murmur gallop, abdomen is soft and nontender with no masses, extremities have no edema.  EKG shows sinus rhythm 71 bpm, first-degree AV block, otherwise within normal limits.  High-sensitivity troponin is negative x2.  The patient's chest pain symptoms were atypical on  presentation and have now resolved.  Objective markers are all normal.  I do not think he requires any further cardiac work-up.  He had a recent heart catheterization outlined above.  He should be continued on his home medications.  Agree with recommendations around his diabetes and need for outpatient evaluation.  Will make arrangements for outpatient cardiology follow-up in the near future.  Sherren Mocha, M.D. 02/11/2020 12:16 PM

## 2020-02-11 NOTE — Discharge Summary (Signed)
Physician Discharge Summary  Joel [name] IEP:[id number] DOB: 05-Aug-1940 DOA: 02/10/2020  PCP: Girtha Rm, NP-C  Admit date: 02/10/2020 Discharge date: 02/11/2020  Admitted From: home Disposition:  home  Recommendations for Outpatient Follow-up:  1. Follow ups as below. 2. Please obtain CBC/BMP/Mag at follow up 3. Please follow up on the following pending results: None  Home Health: None Equipment/Devices: None  Discharge Condition: Stable CODE STATUS: Home   Follow-up Information    Henson, Vickie L, NP-C. Schedule an appointment as soon as possible for a visit in 1 week(s).   Specialty: Family Medicine Contact information: [place] [address] [phone]        Burnell Blanks, MD .   Specialty: Cardiology Contact information: [address] [place] [address] [phone]               HPI: Per Dr. Hal Hope 79 y.o. male with history of CAD status post stenting and peripheral vascular disease, recently diagnosed orbital tumor being followed at [facility] had received rituximab last week for non-Hodgkin's lymphoma involving the orbit with chronic anemia and hypertension presents to the ER because of having epigastric pain and lower sternal pain.  Patient states he went to the church this morning and after coming back he started out having epigastric discomfort with no associated shortness of breath diaphoresis nausea vomiting or abdominal pain.  This lasted for around 2 hours and was not relieved despite taking sublingual nitroglycerin 3 doses.  He presents to the ER.  The pain resolved without any intervention.  ED Course: Chest x-ray EKG and a high sensitive cardiac markers were unremarkable.  Given the cardiac history patient admitted for possible angina.  Pain.  On exam abdomen appears benign LFTs lipase are normal.  Sono abdomen did not show any acute.  Labs are significant for hemoglobin of 8.9  which appears to be chronic.  Covid test was negative.  Hospital Course: Patient admitted with atypical chest pain likely GERD.  He remained chest pain-free during hospital stay.  Serial troponin negative.  EKG without acute ischemic finding.  Evaluated by cardiology and cleared for discharge.  Advised to follow-up with his cardiologist outpatient.  Discharge Diagnoses:  1. Atypical chest pain: Resolved.  Remained chest pain-free during hospital stay. Does not recall exertional chest pain. High-sensitivity troponin negative x2.  EKG reassuring.  Cleared for discharge by cardiology.  Given risk factors, close follow-up with his cardiologist is reccommended. Also recommended trial of increasing dose of PPI. He is on prednisone although low dose.  2. Hypertension: Continue amlodipine and hydrochlorothiazide ARB and Imdur. 3. Chronic iron deficiency anemia: H&H stable.  Recheck at follow-up 4. Non-Hodgkin's lymphoma for which patient is following up with [facility] and has received rituximab last week.  Scheduled to receive rituximab every week. 5. History of peripheral vascular disease-continue aspirin and statin 6. Class 2 obesity-encourage life style change   Body mass index is 36.5 kg/m.            Discharge Exam: Vitals:   02/11/20 0600 02/11/20 0930  BP: (!) 147/84 134/81  Pulse: 67 65  Resp: 14 12  Temp:    SpO2: 98% 98%    GENERAL: No apparent distress.  Nontoxic. HEENT: MMM.  Vision and hearing grossly intact.  NECK: Supple.  No apparent JVD.  RESP:  No IWOB.  Fair aeration bilaterally. CVS:  RRR. Heart sounds normal.  ABD/GI/GU: Bowel sounds present. Soft. Non tender.  MSK/EXT:  Moves extremities. No apparent deformity. No edema.  SKIN: no apparent skin lesion or wound NEURO: Awake, alert and oriented appropriately.  No apparent focal neuro deficit. PSYCH: Calm. Normal affect.   Discharge Instructions  Discharge Instructions    Call MD for:  difficulty  breathing, headache or visual disturbances   Complete by: As directed    Call MD for:  extreme fatigue   Complete by: As directed    Call MD for:  severe uncontrolled pain   Complete by: As directed    Diet - low sodium heart healthy   Complete by: As directed    Discharge instructions   Complete by: As directed    It has been a pleasure taking care of you!  You were hospitalized due to chest pain.  After the test is we have done, it is unlikely that your chest pain is related to your heart or your lung.  It could be due to reflux or heartburn.  You may try doubling you Prilosec to 40 mg daily over the next few days to see if this helps.  You could also try plain Tylenol in addition to your other pain medications.  Please follow-up with your primary care doctor and cardiologist in 1 to 2 weeks or sooner if needed.  Review your medication list and the directions on your medications before you take them.   Take care,   Increase activity slowly   Complete by: As directed      Allergies as of 02/11/2020      Reactions   Topiramate Shortness Of Breath, Other (See Comments)    leg cramps   Atenolol Other (See Comments)   bradycardia   Lisinopril Cough   Tramadol-acetaminophen Cough   Tolerates plain Tramadol      Medication List    STOP taking these medications   sulfamethoxazole-trimethoprim 800-160 MG tablet Commonly known as: BACTRIM DS     TAKE these medications   abiraterone acetate 250 MG tablet Commonly known as: ZYTIGA Take 1,000 mg by mouth daily.   Accu-Chek FastClix Lancets Misc Test once daily   acetaminophen 500 MG tablet Commonly known as: TYLENOL Take 1,000 mg by mouth every 6 (six) hours as needed for mild pain or headache.   albuterol 108 (90 Base) MCG/ACT inhaler Commonly known as: VENTOLIN HFA INHALE 2 PUFFS INTO THE LUNGS EVERY 6 HOURS AS NEEDED FOR WHEEZING OR SHORTNESS OF BREATH What changed: See the new instructions.   amLODipine 10 MG  tablet Commonly known as: NORVASC Take 1 tablet (10 mg total) by mouth daily.   aspirin EC 81 MG tablet Take 81 mg by mouth daily.   ferrous sulfate 325 (65 FE) MG tablet Take 325 mg by mouth once a week. Friday   gabapentin 600 MG tablet Commonly known as: NEURONTIN Take 600 mg by mouth at bedtime.   isosorbide mononitrate 30 MG 24 hr tablet Commonly known as: IMDUR Take 1 tablet (30 mg total) by mouth daily. What changed: when to take this   loratadine 10 MG tablet Commonly known as: CLARITIN Take 10 mg by mouth daily.   losartan-hydrochlorothiazide 100-25 MG tablet Commonly known as: HYZAAR TAKE 1 TABLET BY MOUTH DAILY   nitroGLYCERIN 0.4 MG SL tablet Commonly known as: NITROSTAT PLACE 1 TABLET UNDER THE TONGUE EVERY 5 MINUTES AS NEEDED FOR CHEST PAIN What changed: See the new instructions.   omeprazole 20 MG capsule Commonly known as: PRILOSEC Take 20 mg by mouth daily.  pravastatin 40 MG tablet Commonly known as: PRAVACHOL Take 1 tablet (40 mg total) by mouth every evening.   predniSONE 5 MG tablet Commonly known as: DELTASONE Take 5 mg by mouth daily.   Restasis 0.05 % ophthalmic emulsion Generic drug: cycloSPORINE Place 1 drop into both eyes 2 (two) times daily.   traMADol 50 MG tablet Commonly known as: ULTRAM TAKE 1 TABLET(50 MG) BY MOUTH EVERY 8 HOURS AS NEEDED FOR SEVERE PAIN What changed:   how much to take  how to take this  when to take this  reasons to take this  additional instructions       Consultations:  Cardiology  Procedures/Studies:   DG Chest 2 View  Result Date: 02/10/2020 CLINICAL DATA:  Chest pain. EXAM: CHEST - 2 VIEW COMPARISON:  10/02/2019 FINDINGS: The heart size and mediastinal contours are within normal limits. Both lungs are clear. The visualized skeletal structures are unremarkable. IMPRESSION: No active cardiopulmonary disease. Electronically Signed   By: Nolon Nations M.D.   On: 02/10/2020 17:28   US  Abdomen Complete  Result Date: 02/10/2020 CLINICAL DATA:  Epigastric pain EXAM: ABDOMEN ULTRASOUND COMPLETE COMPARISON:  None. FINDINGS: Gallbladder: No gallstones or wall thickening visualized. No sonographic Murphy sign noted by sonographer. Common bile duct: Diameter: Normal caliber, 5 mm Liver: 2 cysts within the liver, the largest 3.1 cm in the right hepatic lobe. Increased echotexture throughout the liver compatible with fatty infiltration. Portal vein is patent on color Doppler imaging with normal direction of blood flow towards the liver. IVC: No abnormality visualized. Pancreas: Visualized portion unremarkable. Spleen: Size and appearance within normal limits. Right Kidney: Length: 8.4 cm. Echogenicity within normal limits. No mass or hydronephrosis visualized. Left Kidney: Length: 9.9 cm. Echogenicity within normal limits. No mass or hydronephrosis visualized. Abdominal aorta: No aneurysm visualized. Other findings: None. IMPRESSION: No acute findings. Simple appearing hepatic cysts. Mildly increased echotexture throughout the liver suggesting fatty infiltration. Electronically Signed   By: Rolm Baptise M.D.   On: 02/10/2020 23:43        The results of significant diagnostics from this hospitalization (including imaging, microbiology, ancillary and laboratory) are listed below for reference.     Microbiology: Recent Results (from the past 240 hour(s))  Respiratory Panel by RT PCR (Flu A&B, Covid) - Nasopharyngeal Swab     Status: None   Collection Time: 02/10/20  8:24 PM   Specimen: Nasopharyngeal Swab  Result Value Ref Range Status   SARS Coronavirus 2 by RT PCR NEGATIVE NEGATIVE Final    Comment: (NOTE) SARS-CoV-2 target nucleic acids are NOT DETECTED.  The SARS-CoV-2 RNA is generally detectable in upper respiratoy specimens during the acute phase of infection. The lowest concentration of SARS-CoV-2 viral copies this assay can detect is 131 copies/mL. A negative result does not  preclude SARS-Cov-2 infection and should not be used as the sole basis for treatment or other patient management decisions. A negative result may occur with  improper specimen collection/handling, submission of specimen other than nasopharyngeal swab, presence of viral mutation(s) within the areas targeted by this assay, and inadequate number of viral copies (<131 copies/mL). A negative result must be combined with clinical observations, patient history, and epidemiological information. The expected result is Negative.  Fact Sheet for Patients:  PinkCheek.be  Fact Sheet for Healthcare Providers:  GravelBags.it  This test is no t yet approved or cleared by the [place] FDA and  has been authorized for detection and/or diagnosis of SARS-CoV-2 by FDA under an  Emergency Use Authorization (EUA). This EUA will remain  in effect (meaning this test can be used) for the duration of the COVID-19 declaration under Section 564(b)(1) of the Act, 21 U.S.C. section 360bbb-3(b)(1), unless the authorization is terminated or revoked sooner.     Influenza A by PCR NEGATIVE NEGATIVE Final   Influenza B by PCR NEGATIVE NEGATIVE Final    Comment: (NOTE) The Xpert Xpress SARS-CoV-2/FLU/RSV assay is intended as an aid in  the diagnosis of influenza from Nasopharyngeal swab specimens and  should not be used as a sole basis for treatment. Nasal washings and  aspirates are unacceptable for Xpert Xpress SARS-CoV-2/FLU/RSV  testing.  Fact Sheet for Patients: PinkCheek.be  Fact Sheet for Healthcare Providers: GravelBags.it  This test is not yet approved or cleared by the [place] FDA and  has been authorized for detection and/or diagnosis of SARS-CoV-2 by  FDA under an Emergency Use Authorization (EUA). This EUA will remain  in effect (meaning this test can be used) for the  duration of the  Covid-19 declaration under Section 564(b)(1) of the Act, 21  U.S.C. section 360bbb-3(b)(1), unless the authorization is  terminated or revoked. Performed at [facility], [place] [address], [place], [place] [address]      Labs: BNP (last 3 results) No results for input(s): BNP in the last 8760 hours. Basic Metabolic Panel: Recent Labs  Lab 02/10/20 1713 02/10/20 2330  NA 138  --   K 3.8  --   CL 101  --   CO2 26  --   GLUCOSE 89  --   BUN 17  --   CREATININE 1.21 1.14  CALCIUM 9.5  --    Liver Function Tests: Recent Labs  Lab 02/10/20 1833  AST 12*  ALT 15  ALKPHOS 71  BILITOT 0.4  PROT 6.6  ALBUMIN 3.7   Recent Labs  Lab 02/10/20 1833  LIPASE 45   No results for input(s): AMMONIA in the last 168 hours. CBC: Recent Labs  Lab 02/10/20 1713 02/10/20 2330  WBC 7.4 6.7  HGB 8.9* 8.6*  HCT 30.7* 29.7*  MCV 69.5* 70.0*  PLT 253 228   Cardiac Enzymes: No results for input(s): CKTOTAL, CKMB, CKMBINDEX, TROPONINI in the last 168 hours. BNP: Invalid input(s): POCBNP CBG: No results for input(s): GLUCAP in the last 168 hours. D-Dimer No results for input(s): DDIMER in the last 72 hours. Hgb A1c No results for input(s): HGBA1C in the last 72 hours. Lipid Profile No results for input(s): CHOL, HDL, LDLCALC, TRIG, CHOLHDL, LDLDIRECT in the last 72 hours. Thyroid function studies No results for input(s): TSH, T4TOTAL, T3FREE, THYROIDAB in the last 72 hours.  Invalid input(s): FREET3 Anemia work up No results for input(s): VITAMINB12, FOLATE, FERRITIN, TIBC, IRON, RETICCTPCT in the last 72 hours. Urinalysis    Component Value Date/Time   COLORURINE STRAW (A) 04/04/2016 0107   APPEARANCEUR CLEAR 04/04/2016 0107   LABSPEC 1.015 08/24/2017 0946   PHURINE 6.0 04/04/2016 0107   GLUCOSEU NEGATIVE 04/04/2016 0107   HGBUR NEGATIVE 04/04/2016 0107   BILIRUBINUR negative 08/24/2017 0946   BILIRUBINUR n 09/10/2016 1415   KETONESUR negative  08/24/2017 0946   KETONESUR NEGATIVE 04/04/2016 0107   PROTEINUR negative 08/24/2017 0946   PROTEINUR n 09/10/2016 1415   PROTEINUR NEGATIVE 04/04/2016 0107   UROBILINOGEN negative (A) 09/10/2016 1415   UROBILINOGEN 0.2 06/15/2012 1124   NITRITE Negative 08/24/2017 0946   NITRITE n 09/10/2016 1415   NITRITE NEGATIVE 04/04/2016 0107  LEUKOCYTESUR Negative 08/24/2017 0946   Sepsis Labs Invalid input(s): PROCALCITONIN,  WBC,  LACTICIDVEN   Time coordinating discharge: 45 minutes  SIGNED:  Mercy Riding, MD  [facility] 02/11/2020, 11:57 AM  If 7PM-7AM, please contact night-coverage [email or web address]

## 2020-02-12 ENCOUNTER — Telehealth: Payer: Self-pay

## 2020-02-12 DIAGNOSIS — C884 Extranodal marginal zone B-cell lymphoma of mucosa-associated lymphoid tissue [MALT-lymphoma]: Secondary | ICD-10-CM | POA: Diagnosis not present

## 2020-02-12 DIAGNOSIS — Z5112 Encounter for antineoplastic immunotherapy: Secondary | ICD-10-CM | POA: Diagnosis not present

## 2020-02-12 NOTE — Telephone Encounter (Signed)
I called the pt. Per my TOC report he was recently in the hospital for atypical chest pain and epigastric pain, pt. Medications were gone over and reconciled, and he was scheduled for a hospital f/u on 02/14/20.

## 2020-02-14 ENCOUNTER — Ambulatory Visit (INDEPENDENT_AMBULATORY_CARE_PROVIDER_SITE_OTHER): Payer: Medicare HMO | Admitting: Family Medicine

## 2020-02-14 ENCOUNTER — Encounter: Payer: Self-pay | Admitting: Family Medicine

## 2020-02-14 VITALS — BP 110/60 | HR 75 | Wt 247.2 lb

## 2020-02-14 DIAGNOSIS — R0789 Other chest pain: Secondary | ICD-10-CM

## 2020-02-14 DIAGNOSIS — Z09 Encounter for follow-up examination after completed treatment for conditions other than malignant neoplasm: Secondary | ICD-10-CM | POA: Diagnosis not present

## 2020-02-14 DIAGNOSIS — D509 Iron deficiency anemia, unspecified: Secondary | ICD-10-CM

## 2020-02-14 DIAGNOSIS — K219 Gastro-esophageal reflux disease without esophagitis: Secondary | ICD-10-CM

## 2020-02-14 NOTE — Progress Notes (Signed)
   Subjective:    [name], male    DOB: 1941-02-11, 79 y.o.   MRN: [id number]  HPI Chief Complaint  Patient presents with  . hospital follow-u    hospital follow-up, on chest pain- doing well no chest pain now   He is here for a follow up on recent hospitalization for epigastric/chest pain.  Acute cardiac etiology was ruled out.  Suspect GERD may have been contributing to his pain.  Since discharge he denies any pain.  Reports feeling back to his usual. States he is taking his omeprazole twice daily now per the recommendation of the hospitalist.  States he called to schedule appointment with his cardiologist as recommended and he does not have an appointment until February.  He is under the care of hematology for chronic IDA.  Hypertension -reports good compliance with all of his medications.  He is on a statin and aspirin.  Complains of constipation with his iron supplement.  Denies any other concerns today.  Denies fever, chills, dizziness, chest pain, palpitations, shortness of breath, abdominal pain, N/V/D, urinary symptoms, LE edema.     Review of Systems Pertinent positives and negatives in the history of present illness.     Objective:   Physical Exam BP 110/60   Pulse 75   Wt 247 lb 3.2 oz (112.1 kg)   BMI 36.51 kg/m   Alert and in no distress. Cardiac exam shows a regular sinus rhythm without murmurs or gallops. Lungs are clear to auscultation.  Extremities without edema.  Skin is warm and dry.  Normal speech, mood, thought process and behavior.      Assessment & Plan:  Hospital discharge follow-up - Plan: CBC with Differential/Platelet, Basic metabolic panel, Magnesium  Gastroesophageal reflux disease, unspecified whether esophagitis present  Atypical chest pain - Plan: CBC with Differential/Platelet, Basic metabolic panel, Magnesium  Microcytic anemia - Plan: CBC with Differential/Platelet  Reviewed discharge summary, reconcile  medications, reviewed labs, imaging and recommendations per ED and hospitalist.  He is back to his baseline and has been since discharged home. He will continue on twice daily PPI for now.  Discussed avoiding triggers. He will follow-up with cardiology. He is having issues with constipation due to the iron so I recommend he take a stool softener with each iron supplement. Follow up pending labs.

## 2020-02-14 NOTE — Patient Instructions (Signed)
Continue taking your omeprazole twice daily as recommended.  Try taking a stool softener every time you take an iron pill.  Follow-up with your cardiologist as recommended.  We will be in touch with your lab results.

## 2020-02-15 LAB — CBC WITH DIFFERENTIAL/PLATELET
Basophils Absolute: 0 10*3/uL (ref 0.0–0.2)
Basos: 0 %
EOS (ABSOLUTE): 0.1 10*3/uL (ref 0.0–0.4)
Eos: 1 %
Hematocrit: 29.8 % — ABNORMAL LOW (ref 37.5–51.0)
Hemoglobin: 8.8 g/dL — ABNORMAL LOW (ref 13.0–17.7)
Immature Grans (Abs): 0.1 10*3/uL (ref 0.0–0.1)
Immature Granulocytes: 1 %
Lymphocytes Absolute: 0.9 10*3/uL (ref 0.7–3.1)
Lymphs: 9 %
MCH: 20.4 pg — ABNORMAL LOW (ref 26.6–33.0)
MCHC: 29.5 g/dL — ABNORMAL LOW (ref 31.5–35.7)
MCV: 69 fL — ABNORMAL LOW (ref 79–97)
Monocytes Absolute: 0.7 10*3/uL (ref 0.1–0.9)
Monocytes: 6 %
Neutrophils Absolute: 8.7 10*3/uL — ABNORMAL HIGH (ref 1.4–7.0)
Neutrophils: 83 %
Platelets: 242 10*3/uL (ref 150–450)
RBC: 4.32 x10E6/uL (ref 4.14–5.80)
RDW: 18.3 % — ABNORMAL HIGH (ref 11.6–15.4)
WBC: 10.4 10*3/uL (ref 3.4–10.8)

## 2020-02-15 LAB — BASIC METABOLIC PANEL
BUN/Creatinine Ratio: 17 (ref 10–24)
BUN: 21 mg/dL (ref 8–27)
CO2: 24 mmol/L (ref 20–29)
Calcium: 9.3 mg/dL (ref 8.6–10.2)
Chloride: 103 mmol/L (ref 96–106)
Creatinine, Ser: 1.22 mg/dL (ref 0.76–1.27)
GFR calc Af Amer: 64 mL/min/{1.73_m2} (ref >59)
GFR calc non Af Amer: 56 mL/min/{1.73_m2} — ABNORMAL LOW (ref >59)
Glucose: 138 mg/dL — ABNORMAL HIGH (ref 65–99)
Potassium: 4.3 mmol/L (ref 3.5–5.2)
Sodium: 140 mmol/L (ref 134–144)

## 2020-02-15 LAB — MAGNESIUM: Magnesium: 2 mg/dL (ref 1.6–2.3)

## 2020-02-15 NOTE — Progress Notes (Signed)
His labs are stable.

## 2020-02-19 ENCOUNTER — Ambulatory Visit: Payer: Medicare HMO | Admitting: Nurse Practitioner

## 2020-02-20 DIAGNOSIS — Z5112 Encounter for antineoplastic immunotherapy: Secondary | ICD-10-CM | POA: Diagnosis not present

## 2020-02-20 DIAGNOSIS — Z5111 Encounter for antineoplastic chemotherapy: Secondary | ICD-10-CM | POA: Diagnosis not present

## 2020-02-20 DIAGNOSIS — C884 Extranodal marginal zone B-cell lymphoma of mucosa-associated lymphoid tissue [MALT-lymphoma]: Secondary | ICD-10-CM | POA: Diagnosis not present

## 2020-02-22 ENCOUNTER — Other Ambulatory Visit: Payer: Self-pay | Admitting: Family Medicine

## 2020-02-27 DIAGNOSIS — C884 Extranodal marginal zone B-cell lymphoma of mucosa-associated lymphoid tissue [MALT-lymphoma]: Secondary | ICD-10-CM | POA: Diagnosis not present

## 2020-02-27 DIAGNOSIS — Z5112 Encounter for antineoplastic immunotherapy: Secondary | ICD-10-CM | POA: Diagnosis not present

## 2020-03-04 DIAGNOSIS — C884 Extranodal marginal zone B-cell lymphoma of mucosa-associated lymphoid tissue [MALT-lymphoma]: Secondary | ICD-10-CM | POA: Diagnosis not present

## 2020-03-04 DIAGNOSIS — Z5112 Encounter for antineoplastic immunotherapy: Secondary | ICD-10-CM | POA: Diagnosis not present

## 2020-03-05 DIAGNOSIS — M542 Cervicalgia: Secondary | ICD-10-CM | POA: Diagnosis not present

## 2020-03-05 DIAGNOSIS — M791 Myalgia, unspecified site: Secondary | ICD-10-CM | POA: Diagnosis not present

## 2020-03-05 DIAGNOSIS — G518 Other disorders of facial nerve: Secondary | ICD-10-CM | POA: Diagnosis not present

## 2020-03-05 DIAGNOSIS — R519 Headache, unspecified: Secondary | ICD-10-CM | POA: Diagnosis not present

## 2020-03-11 DIAGNOSIS — Z5112 Encounter for antineoplastic immunotherapy: Secondary | ICD-10-CM | POA: Diagnosis not present

## 2020-03-11 DIAGNOSIS — C884 Extranodal marginal zone B-cell lymphoma of mucosa-associated lymphoid tissue [MALT-lymphoma]: Secondary | ICD-10-CM | POA: Diagnosis not present

## 2020-03-18 DIAGNOSIS — M47816 Spondylosis without myelopathy or radiculopathy, lumbar region: Secondary | ICD-10-CM | POA: Diagnosis not present

## 2020-03-18 DIAGNOSIS — M4326 Fusion of spine, lumbar region: Secondary | ICD-10-CM | POA: Diagnosis not present

## 2020-03-19 DIAGNOSIS — Z5112 Encounter for antineoplastic immunotherapy: Secondary | ICD-10-CM | POA: Diagnosis not present

## 2020-03-19 DIAGNOSIS — C884 Extranodal marginal zone B-cell lymphoma of mucosa-associated lymphoid tissue [MALT-lymphoma]: Secondary | ICD-10-CM | POA: Diagnosis not present

## 2020-03-19 DIAGNOSIS — Z79899 Other long term (current) drug therapy: Secondary | ICD-10-CM | POA: Diagnosis not present

## 2020-03-19 DIAGNOSIS — D509 Iron deficiency anemia, unspecified: Secondary | ICD-10-CM | POA: Diagnosis not present

## 2020-03-19 DIAGNOSIS — C8510 Unspecified B-cell lymphoma, unspecified site: Secondary | ICD-10-CM | POA: Diagnosis not present

## 2020-03-26 DIAGNOSIS — C884 Extranodal marginal zone B-cell lymphoma of mucosa-associated lymphoid tissue [MALT-lymphoma]: Secondary | ICD-10-CM | POA: Diagnosis not present

## 2020-03-26 DIAGNOSIS — Z79899 Other long term (current) drug therapy: Secondary | ICD-10-CM | POA: Diagnosis not present

## 2020-03-26 DIAGNOSIS — Z5112 Encounter for antineoplastic immunotherapy: Secondary | ICD-10-CM | POA: Diagnosis not present

## 2020-03-26 DIAGNOSIS — D509 Iron deficiency anemia, unspecified: Secondary | ICD-10-CM | POA: Diagnosis not present

## 2020-04-01 DIAGNOSIS — M5116 Intervertebral disc disorders with radiculopathy, lumbar region: Secondary | ICD-10-CM | POA: Diagnosis not present

## 2020-04-02 DIAGNOSIS — D509 Iron deficiency anemia, unspecified: Secondary | ICD-10-CM | POA: Diagnosis not present

## 2020-04-06 ENCOUNTER — Encounter (HOSPITAL_COMMUNITY): Payer: Self-pay

## 2020-04-06 ENCOUNTER — Emergency Department (HOSPITAL_COMMUNITY): Payer: Medicare HMO

## 2020-04-06 ENCOUNTER — Other Ambulatory Visit: Payer: Self-pay

## 2020-04-06 ENCOUNTER — Emergency Department (HOSPITAL_COMMUNITY)
Admission: EM | Admit: 2020-04-06 | Discharge: 2020-04-06 | Disposition: A | Discharge status: Home / Self Care | Payer: Medicare HMO | Attending: Emergency Medicine | Admitting: Emergency Medicine

## 2020-04-06 DIAGNOSIS — R1013 Epigastric pain: Secondary | ICD-10-CM | POA: Insufficient documentation

## 2020-04-06 DIAGNOSIS — Z8546 Personal history of malignant neoplasm of prostate: Secondary | ICD-10-CM | POA: Insufficient documentation

## 2020-04-06 DIAGNOSIS — R110 Nausea: Secondary | ICD-10-CM | POA: Insufficient documentation

## 2020-04-06 DIAGNOSIS — N182 Chronic kidney disease, stage 2 (mild): Secondary | ICD-10-CM | POA: Diagnosis not present

## 2020-04-06 DIAGNOSIS — J449 Chronic obstructive pulmonary disease, unspecified: Secondary | ICD-10-CM | POA: Diagnosis not present

## 2020-04-06 DIAGNOSIS — Z7982 Long term (current) use of aspirin: Secondary | ICD-10-CM | POA: Insufficient documentation

## 2020-04-06 DIAGNOSIS — I10 Essential (primary) hypertension: Secondary | ICD-10-CM | POA: Diagnosis not present

## 2020-04-06 DIAGNOSIS — I708 Atherosclerosis of other arteries: Secondary | ICD-10-CM | POA: Diagnosis not present

## 2020-04-06 DIAGNOSIS — Z79899 Other long term (current) drug therapy: Secondary | ICD-10-CM | POA: Insufficient documentation

## 2020-04-06 DIAGNOSIS — Z87891 Personal history of nicotine dependence: Secondary | ICD-10-CM | POA: Diagnosis not present

## 2020-04-06 DIAGNOSIS — I723 Aneurysm of iliac artery: Secondary | ICD-10-CM | POA: Diagnosis not present

## 2020-04-06 DIAGNOSIS — Z955 Presence of coronary angioplasty implant and graft: Secondary | ICD-10-CM | POA: Insufficient documentation

## 2020-04-06 DIAGNOSIS — Z20822 Contact with and (suspected) exposure to covid-19: Secondary | ICD-10-CM | POA: Diagnosis not present

## 2020-04-06 DIAGNOSIS — I25118 Atherosclerotic heart disease of native coronary artery with other forms of angina pectoris: Secondary | ICD-10-CM | POA: Diagnosis not present

## 2020-04-06 DIAGNOSIS — R55 Syncope and collapse: Secondary | ICD-10-CM

## 2020-04-06 DIAGNOSIS — N3289 Other specified disorders of bladder: Secondary | ICD-10-CM | POA: Diagnosis not present

## 2020-04-06 DIAGNOSIS — I129 Hypertensive chronic kidney disease with stage 1 through stage 4 chronic kidney disease, or unspecified chronic kidney disease: Secondary | ICD-10-CM | POA: Insufficient documentation

## 2020-04-06 DIAGNOSIS — E1122 Type 2 diabetes mellitus with diabetic chronic kidney disease: Secondary | ICD-10-CM | POA: Diagnosis not present

## 2020-04-06 DIAGNOSIS — K5730 Diverticulosis of large intestine without perforation or abscess without bleeding: Secondary | ICD-10-CM | POA: Diagnosis not present

## 2020-04-06 LAB — URINALYSIS, ROUTINE W REFLEX MICROSCOPIC
Bilirubin Urine: NEGATIVE
Glucose, UA: NEGATIVE mg/dL
Hgb urine dipstick: NEGATIVE
Ketones, ur: NEGATIVE mg/dL
Leukocytes,Ua: NEGATIVE
Nitrite: NEGATIVE
Protein, ur: NEGATIVE mg/dL
Specific Gravity, Urine: 1.015 (ref 1.005–1.030)
pH: 6 (ref 5.0–8.0)

## 2020-04-06 LAB — COMPREHENSIVE METABOLIC PANEL
ALT: 15 U/L (ref 0–44)
AST: 15 U/L (ref 15–41)
Albumin: 3.7 g/dL (ref 3.5–5.0)
Alkaline Phosphatase: 74 U/L (ref 38–126)
Anion gap: 11 (ref 5–15)
BUN: 13 mg/dL (ref 8–23)
CO2: 24 mmol/L (ref 22–32)
Calcium: 9.1 mg/dL (ref 8.9–10.3)
Chloride: 102 mmol/L (ref 98–111)
Creatinine, Ser: 1.08 mg/dL (ref 0.61–1.24)
GFR, Estimated: >60 mL/min (ref >60)
Glucose, Bld: 159 mg/dL — ABNORMAL HIGH (ref 70–99)
Potassium: 3.6 mmol/L (ref 3.5–5.1)
Sodium: 137 mmol/L (ref 135–145)
Total Bilirubin: 0.5 mg/dL (ref 0.3–1.2)
Total Protein: 6.4 g/dL — ABNORMAL LOW (ref 6.5–8.1)

## 2020-04-06 LAB — RESP PANEL BY RT-PCR (FLU A&B, COVID) ARPGX2
Influenza A by PCR: NEGATIVE
Influenza B by PCR: NEGATIVE
SARS Coronavirus 2 by RT PCR: NEGATIVE

## 2020-04-06 LAB — CBC
HCT: 33.2 % — ABNORMAL LOW (ref 39.0–52.0)
Hemoglobin: 9.4 g/dL — ABNORMAL LOW (ref 13.0–17.0)
MCH: 20.2 pg — ABNORMAL LOW (ref 26.0–34.0)
MCHC: 28.3 g/dL — ABNORMAL LOW (ref 30.0–36.0)
MCV: 71.2 fL — ABNORMAL LOW (ref 80.0–100.0)
Platelets: 213 10*3/uL (ref 150–400)
RBC: 4.66 MIL/uL (ref 4.22–5.81)
RDW: 20.4 % — ABNORMAL HIGH (ref 11.5–15.5)
WBC: 7.7 10*3/uL (ref 4.0–10.5)
nRBC: 0 % (ref 0.0–0.2)

## 2020-04-06 LAB — LIPASE, BLOOD: Lipase: 36 U/L (ref 11–51)

## 2020-04-06 LAB — TROPONIN I (HIGH SENSITIVITY)
Troponin I (High Sensitivity): 7 ng/L (ref <18)
Troponin I (High Sensitivity): 6 ng/L (ref <18)

## 2020-04-06 MED ORDER — IOHEXOL 300 MG/ML  SOLN
100.0000 mL | Freq: Once | INTRAMUSCULAR | Status: AC | PRN
  Administered 2020-04-06: 100 mL via INTRAVENOUS

## 2020-04-06 NOTE — ED Notes (Signed)
Pt resting in bed. Reporting mild abdominal discomfort. Awaiting CT scan

## 2020-04-06 NOTE — ED Triage Notes (Signed)
Patient arrived by [facility] complaining of nausea that started while in church this morning. Patient denies pain and reports increased belching with same. Nad

## 2020-04-06 NOTE — Discharge Instructions (Addendum)
There was an abnormality on the CT scan that has the appearance of a lymph node which may indicate a problem with nearby organs.  We recommend follow-up with gastroenterology for this matter.  Call to make an appointment. Return to the emergency department for chest pain, shortness of breath, abdominal pain, blood in the stool, persistent vomiting, passing out, or any other major concerns.

## 2020-04-06 NOTE — ED Provider Notes (Signed)
[facility] EMERGENCY DEPARTMENT Provider Note   CSN: [id number] Arrival date & time: 04/06/20  1034     History No chief complaint on file.   Joel [name] is a 79 y.o. male.  HPI      Joel [name] is a 79 y.o. male, with a history of CAD, DVT, COPD, CKD stage II, HTN, hyperlipidemia, obesity, DM, presenting to the ED with nausea beginning upon waking this morning. Patient also noted onset of epigastric pressure that he states has been mild.  Shortly after church this morning, patient states he began to feel sweaty and then weak all over, causing him to have to sit down.  He recovered a short time later.  Denies fever/chills, recent illness, cough, shortness of breath, chest pain, back pain, urinary symptoms, N/V/D, dizziness, headache, or any other complaints.    Past Medical History:  Diagnosis Date  . Atypical chest pain   . CAD (coronary artery disease)    a. Multiple prior evaluations then 05/2012 s/p BMS to OM2. b. Nuc 11/2012 wnl. c. Cath 2015: patent stent, moderate LAD/RCA dz, treated medically.  . Cancer ([place])    Hx: of prostate  . Cataract   . CKD (chronic kidney disease), stage II   . COPD (chronic obstructive pulmonary disease) ([place])   . Cough secondary to angiotensin converting enzyme inhibitor (ACE-I)   . Diabetes mellitus without complication ([place])   . Diverticulosis   . DVT (deep venous thrombosis) ([place])   . Family history of breast cancer in first degree relative   . GERD (gastroesophageal reflux disease)   . Headache(784.0)    Hx: of  . History of blood transfusion    "I've had 2; don't know what it was related to" (11/09/2012)  . HLD (hyperlipidemia)   . HTN (hypertension)   . Iron deficiency anemia   . Microcytic anemia   . Peripheral vascular disease ([place])    a. L pop-tibial bypass 2011, followed by [facility].  . Personal history of prostate cancer   . Rotator cuff injury    "left arm; never repaired" (11/09/2012)     Patient Active Problem List   Diagnosis Date Noted  . Chest pain 02/10/2020  . Chest pain of uncertain etiology   . Sleep related hypoxia 04/03/2019  . Obesity (BMI 30-39.9) 04/03/2019  . Snoring 04/03/2019  . At risk for sleep apnea 01/10/2019  . Excessive daytime sleepiness 01/10/2019  . Chronic low back pain 09/11/2018  . COPD (chronic obstructive pulmonary disease) ([place])   . Seasonal allergies 07/04/2018  . Chronic obstructive pulmonary disease ([place]) 07/04/2018  . Dizziness 01/20/2018  . Malaise 01/20/2018  . Decreased breath sounds 01/20/2018  . Needs flu shot 01/20/2018  . Chronic nonintractable headache 12/07/2017  . Back pain 11/01/2017  . Radicular pain of lower extremity 11/01/2017  . Prostate cancer ([place]) 11/01/2017  . Family history of breast cancer in first degree relative   . Personal history of prostate cancer   . Elliptocytosis on peripheral blood smear ([place]) 09/13/2016  . Prediabetes 03/22/2016  . PVD (peripheral vascular disease) with claudication ([place]) 11/15/2012  . Chest pain, localized 11/10/2012  . Hypokalemia 11/10/2012  . Bradycardia 11/09/2012  . Thrombocytopenia ([place]) 06/01/2012  . Microcytic anemia   . Atherosclerosis of native arteries of the extremities with intermittent claudication 06/16/2011  . Stable angina ([place]) 06/11/2011  . CAD (coronary artery disease)   . Chest pain, atypical 05/30/2011  . Iron deficiency anemia 10/03/2009  .  DIVERTICULOSIS OF COLON 10/03/2009  . WEIGHT LOSS, ABNORMAL 10/03/2009  . CHEST PAIN UNSPECIFIED 09/13/2008  . Headache(784.0) 08/19/2008  . TIA 06/05/2008  . SHOULDER PAIN, LEFT 10/11/2007  . COUGH DUE TO ACE INHIBITORS 05/07/2007  . History of tobacco use disorder 04/25/2007  . Hyperlipidemia 04/21/2007  . Essential hypertension 04/21/2007  . GERD 04/21/2007    Past Surgical History:  Procedure Laterality Date  . CARDIOVASCULAR STRESS TEST  Aug. 2014  . CATARACT EXTRACTION W/ INTRAOCULAR LENS  IMPLANT Right ~ 2008  . CIRCUMCISION    . COLONOSCOPY    . CORONARY ANGIOPLASTY WITH STENT PLACEMENT  2014   "1" (11/09/2012)  . ILIAC ARTERY ANEURYSM REPAIR    . LEFT HEART CATH  05-31-12  . LEFT HEART CATH AND CORONARY ANGIOGRAPHY N/A 08/01/2019   Procedure: LEFT HEART CATH AND CORONARY ANGIOGRAPHY;  Surgeon: Burnell Blanks, MD;  Location: [facility];  Service: Cardiovascular;  Laterality: N/A;  . LEFT HEART CATHETERIZATION WITH CORONARY ANGIOGRAM N/A 06/01/2011   Procedure: LEFT HEART CATHETERIZATION WITH CORONARY ANGIOGRAM;  Surgeon: Hillary Bow, MD;  Location[facility] CATH LAB;  Service: Cardiovascular;  Laterality: N/A;  . LEFT HEART CATHETERIZATION WITH CORONARY ANGIOGRAM N/A 05/31/2012   Procedure: LEFT HEART CATHETERIZATION WITH CORONARY ANGIOGRAM;  Surgeon: Peter M Martinique, MD;  Location: [facility] CATH LAB;  Service: Cardiovascular;  Laterality: N/A;  . LEFT HEART CATHETERIZATION WITH CORONARY ANGIOGRAM N/A 08/01/2013   Procedure: LEFT HEART CATHETERIZATION WITH CORONARY ANGIOGRAM;  Surgeon: Burnell Blanks, MD;  Location: [facility] CATH LAB;  Service: Cardiovascular;  Laterality: N/A;  . PERCUTANEOUS CORONARY STENT INTERVENTION (PCI-S)  05/31/2012   Procedure: PERCUTANEOUS CORONARY STENT INTERVENTION (PCI-S);  Surgeon: Peter M Martinique, MD;  Location: [facility] CATH LAB;  Service: Cardiovascular;;  . PR VEIN BYPASS GRAFT,AORTO-FEM-POP Left 10/01/2009   left below knee popliteal artery to posterior tibial artery  . SHOULDER ARTHROSCOPY WITH OPEN ROTATOR CUFF REPAIR AND DISTAL CLAVICLE ACROMINECTOMY Left 01/12/2013   Procedure: LEFT SHOULDER ARTHROSCOPY WITH DEBRIDEMENT OPEN DISTAL CLAVICLE RESECTION ,acromioplastyAND ROTATOR CUFF REPAIR;  Surgeon: Yvette Rack., MD;  Location: [facility];  Service: Orthopedics;  Laterality: Left;       Family History  Problem Relation Age of Onset  . Breast cancer Mother        dx 51s  . Hyperlipidemia Mother   . Hypertension Mother   . COPD Father         deceased  . Hyperlipidemia Father   . Hypertension Father   . Hypertension Sister   . Cancer Brother 65       Unknown cancer, possibly stomach  . Hypertension Brother   . Cancer Brother 37       Unknown, possibly lung  . Hypertension Daughter   . Hypertension Son   . Colon cancer Neg Hx        pt is unclear about family hx    Social History   Tobacco Use  . Smoking status: Former Smoker    Packs/day: 0.50    Years: 52.00    Pack years: 26.00    Types: Cigarettes    Quit date: 05/26/2011    Years since quitting: 8.8  . Smokeless tobacco: Never Used  Vaping Use  . Vaping Use: Never used  Substance Use Topics  . Alcohol use: No  . Drug use: No    Home Medications Prior to Admission medications   Medication Sig Start Date End Date Taking? Authorizing Provider  abiraterone acetate (  ZYTIGA) 250 MG tablet Take 1,000 mg by mouth daily.     [provider]  Accu-Chek FastClix Lancets MISC Test once daily 11/23/18   Raenette Rover, Vickie L, NP-C  acetaminophen (TYLENOL) 500 MG tablet Take 1,000 mg by mouth every 6 (six) hours as needed for mild pain or headache. Patient not taking: Reported on 02/14/2020    [provider]  albuterol (VENTOLIN HFA) 108 (90 Base) MCG/ACT inhaler INHALE 2 PUFFS INTO THE LUNGS EVERY 6 HOURS AS NEEDED FOR WHEEZING OR SHORTNESS OF BREATH Patient taking differently: Inhale 2 puffs into the lungs every 4 (four) hours as needed for wheezing or shortness of breath.  03/15/19   Henson, Vickie L, NP-C  amLODipine (NORVASC) 10 MG tablet Take 1 tablet (10 mg total) by mouth daily. 12/07/19   Burnell Blanks, MD  aspirin EC 81 MG tablet Take 81 mg by mouth daily.     [provider]  ferrous sulfate 325 (65 FE) MG tablet Take 325 mg by mouth once a week. Friday Patient not taking: Reported on 02/14/2020    [provider]  gabapentin (NEURONTIN) 600 MG tablet Take 600 mg by mouth at bedtime.     [provider]   isosorbide mononitrate (IMDUR) 30 MG 24 hr tablet Take 1 tablet (30 mg total) by mouth daily. Patient taking differently: Take 30 mg by mouth at bedtime.  07/09/19   Burnell Blanks, MD  loratadine (CLARITIN) 10 MG tablet Take 10 mg by mouth daily.    [provider]  losartan-hydrochlorothiazide (HYZAAR) 100-25 MG tablet TAKE 1 TABLET BY MOUTH DAILY 02/22/20   Henson, Vickie L, NP-C  nitroGLYCERIN (NITROSTAT) 0.4 MG SL tablet PLACE 1 TABLET UNDER THE TONGUE EVERY 5 MINUTES AS NEEDED FOR CHEST PAIN Patient taking differently: Place 0.4 mg under the tongue every 5 (five) minutes as needed for chest pain (max 3 doses).  07/18/19   Lyda Jester M, PA-C  omeprazole (PRILOSEC) 20 MG capsule Take 20 mg by mouth daily.     [provider]  pravastatin (PRAVACHOL) 40 MG tablet Take 1 tablet (40 mg total) by mouth every evening. 04/25/19   Burnell Blanks, MD  predniSONE (DELTASONE) 5 MG tablet Take 5 mg by mouth daily.     [provider]  RESTASIS 0.05 % ophthalmic emulsion Place 1 drop into both eyes 2 (two) times daily. 08/31/19   [provider]  traMADol (ULTRAM) 50 MG tablet TAKE 1 TABLET(50 MG) BY MOUTH EVERY 8 HOURS AS NEEDED FOR SEVERE PAIN Patient taking differently: Take 50 mg by mouth every 8 (eight) hours as needed for moderate pain.  08/03/19   Henson, Vickie L, NP-C    Allergies    Topiramate, Atenolol, Lisinopril, and Tramadol-acetaminophen  Review of Systems   Review of Systems  Constitutional: Negative for fever.  Respiratory: Negative for cough and shortness of breath.   Cardiovascular: Negative for chest pain and leg swelling.  Gastrointestinal: Negative for blood in stool, diarrhea, nausea and vomiting.       Epigastric pressure  Genitourinary: Negative for difficulty urinating, dysuria, frequency and hematuria.  Musculoskeletal: Negative for back pain.  Neurological: Negative for dizziness and headaches.  All other systems  reviewed and are negative.   Physical Exam Updated Vital Signs BP 136/61   Pulse 66   Temp 97.6 F (36.4 C) (Oral)   Resp 18   SpO2 98%   Physical Exam Vitals and nursing note reviewed.  Constitutional:  General: He is not in acute distress.    Appearance: He is well-developed. He is obese. He is not diaphoretic.  HENT:     Head: Normocephalic and atraumatic.     Mouth/Throat:     Mouth: Mucous membranes are moist.     Pharynx: Oropharynx is clear.  Eyes:     Conjunctiva/sclera: Conjunctivae normal.  Cardiovascular:     Rate and Rhythm: Normal rate and regular rhythm.     Pulses: Normal pulses.          Radial pulses are 2+ on the right side and 2+ on the left side.       Posterior tibial pulses are 2+ on the right side and 2+ on the left side.     Heart sounds: Normal heart sounds.     Comments: Tactile temperature in the extremities appropriate and equal bilaterally. Pulmonary:     Effort: Pulmonary effort is normal. No respiratory distress.     Breath sounds: Normal breath sounds.  Abdominal:     Palpations: Abdomen is soft.     Tenderness: There is no abdominal tenderness. There is no guarding.  Musculoskeletal:     Cervical back: Neck supple.     Right lower leg: No edema.     Left lower leg: No edema.  Skin:    General: Skin is warm and dry.  Neurological:     Mental Status: He is alert and oriented to person, place, and time.     Comments: No noted acute cognitive deficit. Sensation grossly intact to light touch in the extremities.   Grip strengths equal bilaterally.   Strength 5/5 in all extremities.  No gait disturbance.  Coordination intact.  Cranial nerves III-XII grossly intact.  Handles oral secretions without noted difficulty.  No noted phonation or speech deficit. No facial droop.   Psychiatric:        Mood and Affect: Mood and affect normal.        Speech: Speech normal.        Behavior: Behavior normal.     ED Results / Procedures /  Treatments   Labs (all labs ordered are listed, but only abnormal results are displayed) Labs Reviewed  COMPREHENSIVE METABOLIC PANEL - Abnormal; Notable for the following components:      Result Value   Glucose, Bld 159 (*)    Total Protein 6.4 (*)    All other components within normal limits  CBC - Abnormal; Notable for the following components:   Hemoglobin 9.4 (*)    HCT 33.2 (*)    MCV 71.2 (*)    MCH 20.2 (*)    MCHC 28.3 (*)    RDW 20.4 (*)    All other components within normal limits  RESP PANEL BY RT-PCR (FLU A&B, COVID) ARPGX2  LIPASE, BLOOD  URINALYSIS, ROUTINE W REFLEX MICROSCOPIC  TROPONIN I (HIGH SENSITIVITY)  TROPONIN I (HIGH SENSITIVITY)   Hemoglobin  Date Value Ref Range Status  04/06/2020 9.4 (L) 13.0 - 17.0 g/dL Final  02/14/2020 8.8 (L) 13.0 - 17.7 g/dL Final  02/10/2020 8.6 (L) 13.0 - 17.0 g/dL Final    Comment:    Reticulocyte Hemoglobin testing may be clinically indicated, consider ordering this additional test [id number]   02/10/2020 8.9 (L) 13.0 - 17.0 g/dL Final    Comment:    Reticulocyte Hemoglobin testing may be clinically indicated, consider ordering this additional test [id number]   10/02/2019 9.0 (L) 13.0 - 17.0 g/dL Final  07/27/2019 10.3 (L)  13.0 - 17.7 g/dL Final  01/10/2019 8.3 (L) 13.0 - 17.7 g/dL Final  08/24/2017 11.3 (L) 13.0 - 17.7 g/dL Final   HGB  Date Value Ref Range Status  11/12/2016 11.3 (L) 13.0 - 17.1 g/dL Final  09/30/2016 10.8 (L) 13.0 - 17.1 g/dL Final     EKG EKG Interpretation  Date/Time:  Sunday April 06 2020 10:44:34 EST Ventricular Rate:  63 PR Interval:  220 QRS Duration: 86 QT Interval:  434 QTC Calculation: 444 R Axis:   41 Text Interpretation: Sinus rhythm with 1st degree A-V block Otherwise normal ECG agree, no sig change from previous Confirmed by Charlesetta Shanks [phone]) on 04/06/2020 8:22:50 PM   Radiology CT ABDOMEN PELVIS W CONTRAST  Result Date: 04/06/2020 CLINICAL DATA:  Epigastric  pain EXAM: CT ABDOMEN AND PELVIS WITH CONTRAST TECHNIQUE: Multidetector CT imaging of the abdomen and pelvis was performed using the standard protocol following bolus administration of intravenous contrast. CONTRAST:  182mL OMNIPAQUE IOHEXOL 300 MG/ML  SOLN COMPARISON:  October 20, 2009. CT chest dated September 15, 2018 this lymph node was not present on the patient's prior CT chest from 2020 FINDINGS: Lower chest: The lung bases are clear. The heart size is normal. Hepatobiliary: Multiple simple appearing cysts are noted throughout the right left hepatic lobes. Normal gallbladder.There is no biliary ductal dilation. Pancreas: Normal contours without ductal dilatation. No peripancreatic fluid collection. Spleen: Unremarkable. Adrenals/Urinary Tract: --Adrenal glands: Unremarkable. --Right kidney/ureter: No hydronephrosis or radiopaque kidney stones. --Left kidney/ureter: No hydronephrosis or radiopaque kidney stones. --Urinary bladder: The urinary bladder is severely distended. Stomach/Bowel: --Stomach/Duodenum: There is a mass adjacent to the distal esophagus measuring approximately 3.1 x 1.9 cm (axial series 3, image 8). This is highly suspicious for a pathologically enlarged lymph node. --Small bowel: Unremarkable. --Colon: There is scattered colonic diverticula without CT evidence for diverticulitis. --Appendix: Normal. Vascular/Lymphatic: Patient is status post prior aortic endograft placement. There is a partially calcified right common iliac artery aneurysm measuring approximately 4.3 cm (previously measuring 4.7 cm. The graft is widely patent. Atherosclerotic changes are noted throughout the visualized arterial structures. --No retroperitoneal lymphadenopathy. --No mesenteric lymphadenopathy. --No pelvic or inguinal lymphadenopathy. Reproductive: Unremarkable Other: No ascites or free air. The abdominal wall is normal. Musculoskeletal. No acute displaced fractures. IMPRESSION: 1. No acute abnormality detected to  explain the patient's symptoms. 2. There is a mass adjacent to the distal esophagus measuring approximately 3.1 x 1.9 cm. This is highly suspicious for a pathologically enlarged lymph node. 3. Severely distended urinary bladder. 4. Scattered colonic diverticula without CT evidence for diverticulitis. Aortic Atherosclerosis (ICD10-I70.0). Electronically Signed   By: Constance Holster M.D.   On: 04/06/2020 19:13    Procedures Procedures (including critical care time)  Medications Ordered in ED Medications  iohexol (OMNIPAQUE) 300 MG/ML solution 100 mL (100 mLs Intravenous Contrast Given 04/06/20 1853)    ED Course  I have reviewed the triage vital signs and the nursing notes.  Pertinent labs & imaging results that were available during my care of the patient were reviewed by me and considered in my medical decision making (see chart for details).    MDM Rules/Calculators/A&P                          Patient presents with nausea, epigastric pressure, and an episode of weakness/near syncope.  Patient with only "very mild" epigastric pressure upon my evaluation. Patient is nontoxic appearing, afebrile, not tachycardic, not tachypneic, not hypotensive, maintains excellent  SPO2 on room air, and is in no apparent distress.   I have reviewed the patient's chart to obtain more information.   I reviewed and interpreted the patient's labs and radiological studies. CT with evidence of possible pathologically enlarged lymph node.  This was discussed with the patient as well as recommended follow-up.  Patient states he has seen [facility] in the past and will follow up with them.  Reported bladder distention was further assessed with post void residual.  PVR 0 mL.  Return precautions discussed.  Patient and his wife voice understanding of these instructions, accept the plan, and are comfortable with discharge.  Findings and plan of care discussed with attending physician, Charlesetta Shanks, MD. Dr.  Johnney Killian personally evaluated and examined this patient.    Vitals:   04/06/20 1038 04/06/20 1250 04/06/20 1700 04/06/20 1946  BP: 121/66 136/61 126/65 (!) 141/73  Pulse: 69 66 62 87  Resp: 18 18 18 18   Temp: 97.6 F (36.4 C)  98.1 F (36.7 C) 98.7 F (37.1 C)  TempSrc: Oral   Oral  SpO2: 100% 98% 98% 100%    Final Clinical Impression(s) / ED Diagnoses Final diagnoses:  Nausea  Near syncope    Rx / DC Orders ED Discharge Orders    None       Layla Maw 04/07/20 0143    Charlesetta Shanks, MD 04/07/20 1615

## 2020-04-06 NOTE — ED Provider Notes (Signed)
Medical screening examination/treatment/procedure(s) were conducted as a shared visit with non-physician practitioner(s) and myself.  I personally evaluated the patient during the encounter.  EKG Interpretation  Date/Time:  Sunday April 06 2020 10:44:34 EST Ventricular Rate:  63 PR Interval:  220 QRS Duration: 86 QT Interval:  434 QTC Calculation: 444 R Axis:   41 Text Interpretation: Sinus rhythm with 1st degree A-V block Otherwise normal ECG agree, no sig change from previous Confirmed by Charlesetta Shanks [phone]) on 04/06/2020 8:22:50 PM  Patient reports while at church this morning he got a bloating and distended feeling in his upper abdomen and epigastrium.  There was associated nausea.  At time of discharge, pain had completely resolved.  Patient does take omeprazole 20 mg twice daily.  Patient is alert and appropriate.  No acute distress.  No respiratory distress.  Abdomen is soft.  No guarding.  Agree with plan of management.   Charlesetta Shanks, MD 04/06/20 2024

## 2020-04-23 ENCOUNTER — Other Ambulatory Visit: Payer: Self-pay | Admitting: Cardiovascular Disease

## 2020-04-23 ENCOUNTER — Encounter: Payer: Self-pay | Admitting: Nurse Practitioner

## 2020-04-23 ENCOUNTER — Other Ambulatory Visit (INDEPENDENT_AMBULATORY_CARE_PROVIDER_SITE_OTHER): Payer: Medicare HMO

## 2020-04-23 ENCOUNTER — Ambulatory Visit: Payer: Medicare HMO | Admitting: Nurse Practitioner

## 2020-04-23 VITALS — BP 116/56 | HR 76 | Ht 68.5 in | Wt 258.0 lb

## 2020-04-23 DIAGNOSIS — D509 Iron deficiency anemia, unspecified: Secondary | ICD-10-CM

## 2020-04-23 DIAGNOSIS — R1013 Epigastric pain: Secondary | ICD-10-CM

## 2020-04-23 DIAGNOSIS — R9389 Abnormal findings on diagnostic imaging of other specified body structures: Secondary | ICD-10-CM

## 2020-04-23 LAB — CBC
HCT: 31.9 % — ABNORMAL LOW (ref 39.0–52.0)
Hemoglobin: 9.9 g/dL — ABNORMAL LOW (ref 13.0–17.0)
MCHC: 31.2 g/dL (ref 30.0–36.0)
MCV: 67.2 fl — ABNORMAL LOW (ref 78.0–100.0)
Platelets: 205 10*3/uL (ref 150.0–400.0)
RBC: 4.75 Mil/uL (ref 4.22–5.81)
RDW: 21.3 % — ABNORMAL HIGH (ref 11.5–15.5)
WBC: 5.4 10*3/uL (ref 4.0–10.5)

## 2020-04-23 LAB — FERRITIN: Ferritin: 671.1 ng/mL — ABNORMAL HIGH (ref 22.0–322.0)

## 2020-04-23 NOTE — Patient Instructions (Addendum)
If you are age 81 or older, your body mass index should be between 23-30. Your Body mass index is 38.66 kg/m. If this is out of the aforementioned range listed, please consider follow up with your Primary Care Provider.  If you are age 27 or younger, your body mass index should be between 19-25. Your Body mass index is 38.66 kg/m. If this is out of the aformentioned range listed, please consider follow up with your Primary Care Provider.    LABS: Your provider has requested that you go to the basement level for lab work before leaving today. Press "B" on the elevator. The lab is located at the first door on the left as you exit the elevator.  HEALTHCARE LAWS AND MY CHART RESULTS: Due to recent changes in healthcare laws, you may see the results of your imaging and laboratory studies on MyChart before your provider has had a chance to review them.  We understand that in some cases there may be results that are confusing or concerning to you. Not all laboratory results come back in the same time frame and the provider may be waiting for multiple results in order to interpret others.  Please give [place] 48 hours in order for your provider to thoroughly review all the results before contacting the office for clarification of your results.   We will be in touch with you regarding follow up of the CT scan.   It was great seeing you today!  Thank you for entrusting me with your care and choosing [facility].  Tye Savoy, NP

## 2020-04-23 NOTE — Progress Notes (Addendum)
ASSESSMENT AND PLAN      # 80 yo male with intermittent mid upper abdominal discomfort unrelated to eating. Recent LFT, lipase normal. He has chronic microcytic anemia which has been stable. CT scan w/ contrast in ED late December shows a mass adjacent to the distal esophagus measuring 3.1 x 1.9 cm highly suspicious for pathologically enlarged lymph node. Unsure if this is part of a process causing his abdominal pain.  --Will discuss with Dr. Carlean Purl to see if he feels EGD is the next best step vrs advanced imaging or other.   Addendum 05/21/20:  Dr. Carlean Purl reviewed case. Patient has a history of low grade B-Cell lymphoma. Followed at [facility], received Rituxan. Patient did not mention this at our office visit. Mass near esophagus actually shows improvement compared to prior scans at [facility]. Moving bowels with miralax, his abdominal pain is better. No need for further GI workup.    # Chronic microcytic anemia.Recent hgb stable at 9.4 without oral iron.  Duodenal biopsies in 2011 for anemia were negative.Unremarkable duodenum on EGD ( no biopsies)  in 2018.  [facility] show villous blunting. He has previously had improvement in hgb on oral iron which would not be expected if had celiac disease. There can be other causes for villous blunting. He doesn't want to take oral iron again due to constipation.  --Obtain ron studies today.  # Prostate cancer, he is taking low dose of prednisone and Abiraterone acetate 250 mg daily   HISTORY OF PRESENT ILLNESS     Primary Gastroenterologist : Silvano Rusk, MD  Chief Complaint : abdominal pain  Joel [name] is a 80 y.o. male with PMH / Oppelo significant for,  but not necessarily limited LZ:[id number] cancer, IDA, CADs/pstent in 2014, CKD, COPD, DM2  Patient has a hx of IDA  Duodenal biopsies for anemia in 2011 by [facility] were normal. [facility]. EGD was normal but duodenal biopsies  showed villous blunting and brunner gland hyperplasia.  Colonoscopy with polypectomy  remarkable for diverticulosis, and a 2 mm tubular adenoma polyp.   We saw him in October 2020 for declining hgb and FOBT+ . Labs obtained, hgb was improving on iron. Looking at hgb trend since then, he has remained fairly stable but still low in mid 8 to mid 9 range as of late December. He denies overt GI bleeding.   Patient is here for evaluation of an abnormal CT scan. He went to ED 12/26 with weakness and sweating.  He gives a 5 or so month history of intermittent mid upper abdominal pain. Pain is not better or worse with eating. Abdomen feels tight, he has a lot of belching. He frequently gets chest discomfort improved with belching. Sometimes having a BM may help as well. He denies nausea / vomiting. He takes something for constipation every other day, ? Miralax.He has gained several pounds since back surgery several months ago.     In ED his hgb was 9.4, low MCV. He stopped taking oral iron months ago due to constipation. No blood in stool. Stools are light brown. Takes a daily baby ASA, no other NSAIDs.   Data Reviewed: 04/06/20  Cr normal, lipase and LFTs normal.  Troponin normal WBC 7.7, Hgb 9.4, MCV 71, platelets 213   Past Medical History:  Diagnosis Date  . Atypical chest pain   . CAD (coronary artery disease)    a.  Multiple prior evaluations then 05/2012 s/p BMS to OM2. b. Nuc 11/2012 wnl. c. Cath 2015: patent stent, moderate LAD/RCA dz, treated medically.  . Cancer ([place])    Hx: of prostate  . Cataract   . CKD (chronic kidney disease), stage II   . COPD (chronic obstructive pulmonary disease) ([place])   . Cough secondary to angiotensin converting enzyme inhibitor (ACE-I)   . Diabetes mellitus without complication ([place])   . Diverticulosis   . DVT (deep venous thrombosis) ([place])   . Family history of breast cancer in first degree relative   . GERD (gastroesophageal reflux disease)    Hx: of  .  History of blood transfusion    "I've had 2; don't know what it was related to" (11/09/2012)  . HLD (hyperlipidemia)   . HTN (hypertension)   . Microcytic anemia   . Peripheral vascular disease ([place])    a. L pop-tibial bypass 2011, followed by [facility].  . Personal history of prostate cancer   . Rotator cuff injury    "left arm; never repaired" (11/09/2012)    Current Medications, Allergies, Past Surgical History, Family History and Social History were reviewed in [employer] record.   Current Outpatient Medications  Medication Sig Dispense Refill  . abiraterone acetate (ZYTIGA) 250 MG tablet Take 1,000 mg by mouth daily.     . Accu-Chek FastClix Lancets MISC Test once daily 102 each 12  . acetaminophen (TYLENOL) 500 MG tablet Take 1,000 mg by mouth every 6 (six) hours as needed for mild pain or headache.    . albuterol (VENTOLIN HFA) 108 (90 Base) MCG/ACT inhaler INHALE 2 PUFFS INTO THE LUNGS EVERY 6 HOURS AS NEEDED FOR WHEEZING OR SHORTNESS OF BREATH (Patient taking differently: Inhale 2 puffs into the lungs every 4 (four) hours as needed for wheezing or shortness of breath.) 54 g 0  . amLODipine (NORVASC) 10 MG tablet Take 1 tablet (10 mg total) by mouth daily. 90 tablet 3  . aspirin EC 81 MG tablet Take 81 mg by mouth daily.     Marland Kitchen gabapentin (NEURONTIN) 600 MG tablet Take 600 mg by mouth at bedtime.     . isosorbide mononitrate (IMDUR) 30 MG 24 hr tablet Take 1 tablet (30 mg total) by mouth daily. (Patient taking differently: Take 30 mg by mouth at bedtime.) 90 tablet 3  . latanoprost (XALATAN) 0.005 % ophthalmic solution Place 1 drop into both eyes at bedtime.    Marland Kitchen loratadine (CLARITIN) 10 MG tablet Take 10 mg by mouth daily.    Marland Kitchen losartan-hydrochlorothiazide (HYZAAR) 100-25 MG tablet TAKE 1 TABLET BY MOUTH DAILY 90 tablet 0  . nitroGLYCERIN (NITROSTAT) 0.4 MG SL tablet PLACE 1 TABLET UNDER THE TONGUE EVERY 5 MINUTES AS NEEDED FOR CHEST PAIN (Patient taking  differently: Place 0.4 mg under the tongue every 5 (five) minutes as needed for chest pain (max 3 doses).) 25 tablet 4  . omeprazole (PRILOSEC) 20 MG capsule Take 20 mg by mouth in the morning and at bedtime.    . predniSONE (DELTASONE) 5 MG tablet Take 5 mg by mouth daily.     . RESTASIS 0.05 % ophthalmic emulsion Place 1 drop into both eyes 2 (two) times daily.    . traMADol (ULTRAM) 50 MG tablet TAKE 1 TABLET(50 MG) BY MOUTH EVERY 8 HOURS AS NEEDED FOR SEVERE PAIN (Patient taking differently: Take 50 mg by mouth every 8 (eight) hours as needed for moderate pain.) 30 tablet 0   No current  facility-administered medications for this visit.    Review of Systems: No chest pain. No shortness of breath. No urinary complaints.   PHYSICAL EXAM :    Wt Readings from Last 3 Encounters:  04/23/20 258 lb (117 kg)  02/14/20 247 lb 3.2 oz (112.1 kg)  02/10/20 247 lb 2.2 oz (112.1 kg)    BP (!) 116/56 (BP Location: Left Arm, Patient Position: Sitting, Cuff Size: Normal)   Pulse 76   Ht 5' 8.5" (1.74 m) Comment: height measured without shoes  Wt 258 lb (117 kg)   BMI 38.66 kg/m  Constitutional:  Pleasant male in no acute distress. Psychiatric: Normal mood and affect. Behavior is normal. EENT: Pupils normal.  Conjunctivae are normal. No scleral icterus. Neck supple.  Cardiovascular: Normal rate, regular rhythm. No edema Pulmonary/chest: Effort normal and breath sounds normal. No wheezing, rales or rhonchi. Abdominal: Soft, nondistended, nontender. Bowel sounds active throughout. There are no masses palpable. No hepatomegaly. Neurological: Alert and oriented to person place and time. Skin: Skin is warm and dry. No rashes noted.  Tye Savoy, NP  04/23/2020, 2:15 PM

## 2020-04-24 LAB — IRON, TOTAL/TOTAL IRON BINDING CAP
%SAT: 22 % (calc) (ref 20–48)
Iron: 53 ug/dL (ref 50–180)
TIBC: 240 mcg/dL (calc) — ABNORMAL LOW (ref 250–425)

## 2020-04-25 ENCOUNTER — Telehealth: Payer: Self-pay | Admitting: Cardiovascular Disease

## 2020-04-25 ENCOUNTER — Encounter: Payer: Self-pay | Admitting: Nurse Practitioner

## 2020-04-25 NOTE — Telephone Encounter (Signed)
Elster is calling requesting to speak with a nurse to see if he can be worked in for a sooner appointment due to some issues he is having that he states is hard to explain, but is having to take additional medication for daily. Please advise.

## 2020-04-29 DIAGNOSIS — M4326 Fusion of spine, lumbar region: Secondary | ICD-10-CM | POA: Diagnosis not present

## 2020-04-29 DIAGNOSIS — M5116 Intervertebral disc disorders with radiculopathy, lumbar region: Secondary | ICD-10-CM | POA: Diagnosis not present

## 2020-04-30 ENCOUNTER — Other Ambulatory Visit: Payer: Self-pay

## 2020-04-30 ENCOUNTER — Telehealth: Payer: Self-pay

## 2020-04-30 DIAGNOSIS — R9389 Abnormal findings on diagnostic imaging of other specified body structures: Secondary | ICD-10-CM

## 2020-04-30 DIAGNOSIS — I10 Essential (primary) hypertension: Secondary | ICD-10-CM

## 2020-04-30 NOTE — Telephone Encounter (Signed)
-----   Message from Willia Craze, NP sent at 04/27/2020  2:57 PM EST ----- Regarding: FW: Calling in for some backup Beth, please call patient and let him know I talked to Dr Carlean Purl. He reviewed the cT scan and recommeding chest ct scan with and without contrast to better define this mass. Will you please schedule it for patient. Thanks ----- Message ----- From: Gatha Mayer, MD Sent: 04/25/2020   4:59 PM EST To: Willia Craze, NP Subject: RE: Calling in for some backup                 I would do a chest CT with and without contrast.  I know this may not seem to be related to his symptoms but it is an abnormality we need to chase and go from there.  He could need an EUS with FNA of the lymph node depending upon what is seen. ----- Message ----- From: Willia Craze, NP Sent: 04/25/2020   2:48 PM EST To: Gatha Mayer, MD Subject: Calling in for some backup                     Thoughts?   Thanks, Pg

## 2020-04-30 NOTE — Telephone Encounter (Signed)
Spoke with the patient.  He is feeling ok but has some things going on w his stomach that he already has been seen for and having test on.  He states that he is ok heartwise and was wanting to get a sooner appointment.  He did not realize he is scheduled for 06/06/20 and does not want to move this up since he is busy with imaging and other doctors.  Will call if needs anything sooner.

## 2020-04-30 NOTE — Telephone Encounter (Signed)
CT scan will be at [place] [address] per patient request.  Needs labs for BUN and Creatinine prior to 05/15/20. CT chest with contrast 05/15/20 at 2:00 pm Called to give the information to the patient. He asks to be called again in 15 minutes because he is not at home.

## 2020-04-30 NOTE — Telephone Encounter (Signed)
Called the listed phone number for the patient. No answer. Voicemail is not set up.

## 2020-04-30 NOTE — Telephone Encounter (Signed)
Spoke with the patient. Information about the labs and the imaging given to him.

## 2020-05-06 ENCOUNTER — Encounter (HOSPITAL_COMMUNITY): Payer: Self-pay | Admitting: Emergency Medicine

## 2020-05-06 ENCOUNTER — Other Ambulatory Visit: Payer: Self-pay

## 2020-05-06 ENCOUNTER — Emergency Department (HOSPITAL_COMMUNITY): Payer: Medicare HMO

## 2020-05-06 ENCOUNTER — Emergency Department (HOSPITAL_COMMUNITY)
Admission: EM | Admit: 2020-05-06 | Discharge: 2020-05-06 | Disposition: A | Discharge status: Home / Self Care | Payer: Medicare HMO | Attending: Emergency Medicine | Admitting: Emergency Medicine

## 2020-05-06 DIAGNOSIS — I129 Hypertensive chronic kidney disease with stage 1 through stage 4 chronic kidney disease, or unspecified chronic kidney disease: Secondary | ICD-10-CM | POA: Insufficient documentation

## 2020-05-06 DIAGNOSIS — Z79899 Other long term (current) drug therapy: Secondary | ICD-10-CM | POA: Diagnosis not present

## 2020-05-06 DIAGNOSIS — J449 Chronic obstructive pulmonary disease, unspecified: Secondary | ICD-10-CM | POA: Insufficient documentation

## 2020-05-06 DIAGNOSIS — N182 Chronic kidney disease, stage 2 (mild): Secondary | ICD-10-CM | POA: Diagnosis not present

## 2020-05-06 DIAGNOSIS — E1122 Type 2 diabetes mellitus with diabetic chronic kidney disease: Secondary | ICD-10-CM | POA: Insufficient documentation

## 2020-05-06 DIAGNOSIS — I2510 Atherosclerotic heart disease of native coronary artery without angina pectoris: Secondary | ICD-10-CM | POA: Insufficient documentation

## 2020-05-06 DIAGNOSIS — Z8546 Personal history of malignant neoplasm of prostate: Secondary | ICD-10-CM | POA: Diagnosis not present

## 2020-05-06 DIAGNOSIS — Z7982 Long term (current) use of aspirin: Secondary | ICD-10-CM | POA: Insufficient documentation

## 2020-05-06 DIAGNOSIS — R0789 Other chest pain: Secondary | ICD-10-CM | POA: Insufficient documentation

## 2020-05-06 DIAGNOSIS — Z8673 Personal history of transient ischemic attack (TIA), and cerebral infarction without residual deficits: Secondary | ICD-10-CM | POA: Diagnosis not present

## 2020-05-06 DIAGNOSIS — R079 Chest pain, unspecified: Secondary | ICD-10-CM | POA: Diagnosis not present

## 2020-05-06 DIAGNOSIS — Z955 Presence of coronary angioplasty implant and graft: Secondary | ICD-10-CM | POA: Insufficient documentation

## 2020-05-06 DIAGNOSIS — J9811 Atelectasis: Secondary | ICD-10-CM | POA: Diagnosis not present

## 2020-05-06 LAB — CBC
HCT: 31.5 % — ABNORMAL LOW (ref 39.0–52.0)
Hemoglobin: 9.3 g/dL — ABNORMAL LOW (ref 13.0–17.0)
MCH: 21.6 pg — ABNORMAL LOW (ref 26.0–34.0)
MCHC: 29.5 g/dL — ABNORMAL LOW (ref 30.0–36.0)
MCV: 73.3 fL — ABNORMAL LOW (ref 80.0–100.0)
Platelets: 210 10*3/uL (ref 150–400)
RBC: 4.3 MIL/uL (ref 4.22–5.81)
RDW: 19.9 % — ABNORMAL HIGH (ref 11.5–15.5)
WBC: 6 10*3/uL (ref 4.0–10.5)
nRBC: 0 % (ref 0.0–0.2)

## 2020-05-06 LAB — TROPONIN I (HIGH SENSITIVITY)
Troponin I (High Sensitivity): 8 ng/L (ref <18)
Troponin I (High Sensitivity): 7 ng/L (ref <18)

## 2020-05-06 LAB — BASIC METABOLIC PANEL
Anion gap: 13 (ref 5–15)
BUN: 11 mg/dL (ref 8–23)
CO2: 23 mmol/L (ref 22–32)
Calcium: 9 mg/dL (ref 8.9–10.3)
Chloride: 101 mmol/L (ref 98–111)
Creatinine, Ser: 1.16 mg/dL (ref 0.61–1.24)
GFR, Estimated: >60 mL/min (ref >60)
Glucose, Bld: 157 mg/dL — ABNORMAL HIGH (ref 70–99)
Potassium: 3 mmol/L — ABNORMAL LOW (ref 3.5–5.1)
Sodium: 137 mmol/L (ref 135–145)

## 2020-05-06 MED ORDER — NITROGLYCERIN 0.4 MG SL SUBL: 0.4000 mg | SUBLINGUAL_TABLET | SUBLINGUAL | 0 refills | Status: DC | PRN

## 2020-05-06 NOTE — ED Notes (Signed)
Patient verbalized understanding of discharge instructions. Opportunity for questions and answers.  

## 2020-05-06 NOTE — ED Provider Notes (Signed)
[facility] EMERGENCY DEPARTMENT Provider Note   CSN: [id number] Arrival date & time: 05/06/20  0841     History Chief Complaint  Patient presents with  . Chest Pain    Joel [name] is a 80 y.o. male.  CAD status post bare-metal stent to OM2 in 2014.  Left heart cath on 07/2019 showed patent stent with residual moderate disease in the RCA and LAD  Presents to ER with concern for chest pain.  Patient reports that this morning while he was drinking his morning coffee and at rest he noted some left-sided chest burning/indigestion sensation.  Lasted for around an hour but subsided after he came to ER.  He took a nitroglycerin but did not note any change in his symptoms.  Pain was moderate.  Nonradiating.  No ongoing pain at this time.  Felt similar to chest pain episode in November of this past year.   HPI     Past Medical History:  Diagnosis Date  . Atypical chest pain   . CAD (coronary artery disease)    a. Multiple prior evaluations then 05/2012 s/p BMS to OM2. b. Nuc 11/2012 wnl. c. Cath 2015: patent stent, moderate LAD/RCA dz, treated medically.  . Cancer ([place])    Hx: of prostate  . Cataract   . CKD (chronic kidney disease), stage II   . COPD (chronic obstructive pulmonary disease) ([place])   . Cough secondary to angiotensin converting enzyme inhibitor (ACE-I)   . Diabetes mellitus without complication ([place])   . Diverticulosis   . DVT (deep venous thrombosis) ([place])   . Family history of breast cancer in first degree relative   . GERD (gastroesophageal reflux disease)   . Headache(784.0)    Hx: of  . History of blood transfusion    "I've had 2; don't know what it was related to" (11/09/2012)  . HLD (hyperlipidemia)   . HTN (hypertension)   . Iron deficiency anemia   . Microcytic anemia   . Peripheral vascular disease ([place])    a. L pop-tibial bypass 2011, followed by [facility].  . Personal history of prostate cancer   . Rotator cuff injury    "left arm;  never repaired" (11/09/2012)    Patient Active Problem List   Diagnosis Date Noted  . Chest pain 02/10/2020  . Chest pain of uncertain etiology   . Sleep related hypoxia 04/03/2019  . Obesity (BMI 30-39.9) 04/03/2019  . Snoring 04/03/2019  . At risk for sleep apnea 01/10/2019  . Excessive daytime sleepiness 01/10/2019  . Chronic low back pain 09/11/2018  . COPD (chronic obstructive pulmonary disease) ([place])   . Seasonal allergies 07/04/2018  . Chronic obstructive pulmonary disease ([place]) 07/04/2018  . Dizziness 01/20/2018  . Malaise 01/20/2018  . Decreased breath sounds 01/20/2018  . Needs flu shot 01/20/2018  . Chronic nonintractable headache 12/07/2017  . Back pain 11/01/2017  . Radicular pain of lower extremity 11/01/2017  . Prostate cancer ([place]) 11/01/2017  . Family history of breast cancer in first degree relative   . Personal history of prostate cancer   . Elliptocytosis on peripheral blood smear ([place]) 09/13/2016  . Prediabetes 03/22/2016  . PVD (peripheral vascular disease) with claudication ([place]) 11/15/2012  . Chest pain, localized 11/10/2012  . Hypokalemia 11/10/2012  . Bradycardia 11/09/2012  . Thrombocytopenia ([place]) 06/01/2012  . Microcytic anemia   . Atherosclerosis of native arteries of the extremities with intermittent claudication 06/16/2011  . Stable angina ([place]) 06/11/2011  . CAD (coronary artery  disease)   . Chest pain, atypical 05/30/2011  . Iron deficiency anemia 10/03/2009  . DIVERTICULOSIS OF COLON 10/03/2009  . WEIGHT LOSS, ABNORMAL 10/03/2009  . CHEST PAIN UNSPECIFIED 09/13/2008  . Headache(784.0) 08/19/2008  . TIA 06/05/2008  . SHOULDER PAIN, LEFT 10/11/2007  . COUGH DUE TO ACE INHIBITORS 05/07/2007  . History of tobacco use disorder 04/25/2007  . Hyperlipidemia 04/21/2007  . Essential hypertension 04/21/2007  . GERD 04/21/2007    Past Surgical History:  Procedure Laterality Date  . CARDIOVASCULAR STRESS TEST  Aug. 2014  . CATARACT  EXTRACTION W/ INTRAOCULAR LENS IMPLANT Right ~ 2008  . CIRCUMCISION    . COLONOSCOPY    . CORONARY ANGIOPLASTY WITH STENT PLACEMENT  2014   "1" (11/09/2012)  . ILIAC ARTERY ANEURYSM REPAIR    . LEFT HEART CATH  05-31-12  . LEFT HEART CATH AND CORONARY ANGIOGRAPHY N/A 08/01/2019   Procedure: LEFT HEART CATH AND CORONARY ANGIOGRAPHY;  Surgeon: Burnell Blanks, MD;  Location: [facility];  Service: Cardiovascular;  Laterality: N/A;  . LEFT HEART CATHETERIZATION WITH CORONARY ANGIOGRAM N/A 06/01/2011   Procedure: LEFT HEART CATHETERIZATION WITH CORONARY ANGIOGRAM;  Surgeon: Hillary Bow, MD;  Location: [facility] CATH LAB;  Service: Cardiovascular;  Laterality: N/A;  . LEFT HEART CATHETERIZATION WITH CORONARY ANGIOGRAM N/A 05/31/2012   Procedure: LEFT HEART CATHETERIZATION WITH CORONARY ANGIOGRAM;  Surgeon: Peter M Martinique, MD;  Location: [facility] CATH LAB;  Service: Cardiovascular;  Laterality: N/A;  . LEFT HEART CATHETERIZATION WITH CORONARY ANGIOGRAM N/A 08/01/2013   Procedure: LEFT HEART CATHETERIZATION WITH CORONARY ANGIOGRAM;  Surgeon: Burnell Blanks, MD;  Location: [facility] CATH LAB;  Service: Cardiovascular;  Laterality: N/A;  . PERCUTANEOUS CORONARY STENT INTERVENTION (PCI-S)  05/31/2012   Procedure: PERCUTANEOUS CORONARY STENT INTERVENTION (PCI-S);  Surgeon: Peter M Martinique, MD;  Location: [facility] CATH LAB;  Service: Cardiovascular;;  . PR VEIN BYPASS GRAFT,AORTO-FEM-POP Left 10/01/2009   left below knee popliteal artery to posterior tibial artery  . SHOULDER ARTHROSCOPY WITH OPEN ROTATOR CUFF REPAIR AND DISTAL CLAVICLE ACROMINECTOMY Left 01/12/2013   Procedure: LEFT SHOULDER ARTHROSCOPY WITH DEBRIDEMENT OPEN DISTAL CLAVICLE RESECTION ,acromioplastyAND ROTATOR CUFF REPAIR;  Surgeon: Yvette Rack., MD;  Location: [facility];  Service: Orthopedics;  Laterality: Left;       Family History  Problem Relation Age of Onset  . Breast cancer Mother        dx 66s  . Hyperlipidemia Mother   .  Hypertension Mother   . COPD Father        deceased  . Hyperlipidemia Father   . Hypertension Father   . Hypertension Sister   . Cancer Brother 65       Unknown cancer, possibly stomach  . Hypertension Brother   . Cancer Brother 70       Unknown, possibly lung  . Hypertension Daughter   . Hypertension Son   . Colon cancer Neg Hx        pt is unclear about family hx    Social History   Tobacco Use  . Smoking status: Former Smoker    Packs/day: 0.50    Years: 52.00    Pack years: 26.00    Types: Cigarettes    Quit date: 05/26/2011    Years since quitting: 8.9  . Smokeless tobacco: Never Used  Vaping Use  . Vaping Use: Never used  Substance Use Topics  . Alcohol use: No  . Drug use: No    Home Medications Prior to  Admission medications   Medication Sig Start Date End Date Taking? Authorizing Provider  abiraterone acetate (ZYTIGA) 250 MG tablet Take 1,000 mg by mouth daily.     [provider]  Accu-Chek FastClix Lancets MISC Test once daily 11/23/18   Raenette Rover, Vickie L, NP-C  acetaminophen (TYLENOL) 500 MG tablet Take 1,000 mg by mouth every 6 (six) hours as needed for mild pain or headache.    [provider]  albuterol (VENTOLIN HFA) 108 (90 Base) MCG/ACT inhaler INHALE 2 PUFFS INTO THE LUNGS EVERY 6 HOURS AS NEEDED FOR WHEEZING OR SHORTNESS OF BREATH Patient taking differently: Inhale 2 puffs into the lungs every 4 (four) hours as needed for wheezing or shortness of breath. 03/15/19   Henson, Vickie L, NP-C  amLODipine (NORVASC) 10 MG tablet Take 1 tablet (10 mg total) by mouth daily. 12/07/19   Burnell Blanks, MD  aspirin EC 81 MG tablet Take 81 mg by mouth daily.     [provider]  gabapentin (NEURONTIN) 600 MG tablet Take 600 mg by mouth at bedtime.     [provider]  isosorbide mononitrate (IMDUR) 30 MG 24 hr tablet Take 1 tablet (30 mg total) by mouth daily. Patient taking differently: Take 30 mg by mouth at bedtime.  07/09/19   Burnell Blanks, MD  latanoprost (XALATAN) 0.005 % ophthalmic solution Place 1 drop into both eyes at bedtime. 02/28/20   [provider]  loratadine (CLARITIN) 10 MG tablet Take 10 mg by mouth daily.    [provider]  losartan-hydrochlorothiazide (HYZAAR) 100-25 MG tablet TAKE 1 TABLET BY MOUTH DAILY 02/22/20   Henson, Vickie L, NP-C  nitroGLYCERIN (NITROSTAT) 0.4 MG SL tablet Place 1 tablet (0.4 mg total) under the tongue every 5 (five) minutes as needed for chest pain (max 3 doses). 05/06/20   Lucrezia Starch, MD  omeprazole (PRILOSEC) 20 MG capsule Take 20 mg by mouth in the morning and at bedtime.    [provider]  predniSONE (DELTASONE) 5 MG tablet Take 5 mg by mouth daily.     [provider]  RESTASIS 0.05 % ophthalmic emulsion Place 1 drop into both eyes 2 (two) times daily. 08/31/19   [provider]  traMADol (ULTRAM) 50 MG tablet TAKE 1 TABLET(50 MG) BY MOUTH EVERY 8 HOURS AS NEEDED FOR SEVERE PAIN Patient taking differently: Take 50 mg by mouth every 8 (eight) hours as needed for moderate pain. 08/03/19   Henson, Vickie L, NP-C    Allergies    Topiramate, Atenolol, Lisinopril, and Tramadol-acetaminophen  Review of Systems   Review of Systems  Constitutional: Negative for chills and fever.  HENT: Negative for ear pain and sore throat.   Eyes: Negative for pain and visual disturbance.  Respiratory: Positive for chest tightness. Negative for cough and shortness of breath.   Cardiovascular: Positive for chest pain. Negative for palpitations.  Gastrointestinal: Negative for abdominal pain and vomiting.  Genitourinary: Negative for dysuria and hematuria.  Musculoskeletal: Negative for arthralgias and back pain.  Skin: Negative for color change and rash.  Neurological: Negative for seizures and syncope.  All other systems reviewed and are negative.   Physical Exam Updated Vital Signs BP (!) 146/68   Pulse 61    Temp (!) 97.4 F (36.3 C) (Oral)   Resp 14   Ht 5\' 9"  (1.753 m)   Wt 115.2 kg   SpO2 99%   BMI 37.51 kg/m   Physical Exam Vitals and nursing note  reviewed.  Constitutional:      Appearance: He is well-developed and well-nourished.  HENT:     Head: Normocephalic and atraumatic.  Eyes:     Conjunctiva/sclera: Conjunctivae normal.  Cardiovascular:     Rate and Rhythm: Normal rate and regular rhythm.     Heart sounds: No murmur heard.   Pulmonary:     Effort: Pulmonary effort is normal. No respiratory distress.     Breath sounds: Normal breath sounds.  Chest:     Chest wall: No tenderness or crepitus.  Abdominal:     Palpations: Abdomen is soft.     Tenderness: There is no abdominal tenderness.  Musculoskeletal:        General: No edema.     Cervical back: Neck supple.     Right lower leg: No edema.     Left lower leg: No edema.  Skin:    General: Skin is warm and dry.  Neurological:     General: No focal deficit present.     Mental Status: He is alert.  Psychiatric:        Mood and Affect: Mood and affect and mood normal.        Behavior: Behavior normal.     ED Results / Procedures / Treatments   Labs (all labs ordered are listed, but only abnormal results are displayed) Labs Reviewed  BASIC METABOLIC PANEL - Abnormal; Notable for the following components:      Result Value   Potassium 3.0 (*)    Glucose, Bld 157 (*)    All other components within normal limits  CBC - Abnormal; Notable for the following components:   Hemoglobin 9.3 (*)    HCT 31.5 (*)    MCV 73.3 (*)    MCH 21.6 (*)    MCHC 29.5 (*)    RDW 19.9 (*)    All other components within normal limits  TROPONIN I (HIGH SENSITIVITY)  TROPONIN I (HIGH SENSITIVITY)    EKG EKG Interpretation  Date/Time:  Tuesday May 06 2020 21:17:51 EST Ventricular Rate:  62 PR Interval:    QRS Duration: 92 QT Interval:  436 QTC Calculation: 443 R Axis:   64 Text Interpretation: Sinus rhythm Prolonged  PR interval RSR' in V1 or V2, probably normal variant No significant change since last tracing Confirmed by Aletta Edouard [phone]) on 05/07/2020 9:57:04 AM   Radiology DG Chest 2 View  Result Date: 05/06/2020 CLINICAL DATA:  Chest pain. EXAM: CHEST - 2 VIEW COMPARISON:  02/10/2020. FINDINGS: Mediastinum hilar structures normal. Heart size normal. Low lung volumes with mild bibasilar atelectasis. No pleural effusion or pneumothorax. Heart appears to be interposition of the colon under the left hemidiaphragm noted. Degenerative changes and scoliosis thoracic spine. IMPRESSION: Low lung volumes with mild bibasilar atelectasis. Electronically Signed   By: Marcello Moores  Register   On: 05/06/2020 09:06    Procedures Procedures   Medications Ordered in ED Medications - No data to display  ED Course  I have reviewed the triage vital signs and the nursing notes.  Pertinent labs & imaging results that were available during my care of the patient were reviewed by me and considered in my medical decision making (see chart for details).    MDM Rules/Calculators/A&P                         80 year old male presents to ER with concern for chest pain.  He has a very extensive past medical  history including history of coronary artery disease post stent placement 2014.  On physical exam, he was noted to be remarkably well-appearing in no acute distress with no ongoing symptoms.  His EKG did not have any new acute ischemic changes and his troponin x2 is within normal limits.  His CXR was negative.  No tachypnea, tachycardia, difficulty in breathing, doubt PE.  Given his cardiac history, reviewed case with Dr. Dub Mikes with cardiology.  He states given the current work-up, patient can be managed in the outpatient setting.  He does recommend close follow-up with his primary cardiologist.  Reviewed this recommendation with patient and reviewed strict return precautions. Based on symptomotology, suspect more likely  indigestion that cardiac. Abd soft and labs normal, doubt acute abdominal process. Will dc home.    After the discussed management above, the patient was determined to be safe for discharge.  The patient was in agreement with this plan and all questions regarding their care were answered.  ED return precautions were discussed and the patient will return to the ED with any significant worsening of condition.   Final Clinical Impression(s) / ED Diagnoses Final diagnoses:  Chest pain, unspecified type    Rx / DC Orders ED Discharge Orders         Ordered    nitroGLYCERIN (NITROSTAT) 0.4 MG SL tablet  Every 5 min PRN        05/06/20 2149           Lucrezia Starch, MD 05/07/20 1544

## 2020-05-06 NOTE — ED Triage Notes (Signed)
Pt reports L sided chest pain and belching that began this morning after he got up. Denies sob. resp e/u, nad.

## 2020-05-06 NOTE — Discharge Instructions (Addendum)
Please call your cardiology office to get a closer follow-up appointment.  Return to ER if you have any recurrent episodes of chest pain, any abdominal pain, nausea or vomiting.  Additionally recommend following up with primary doctor and keeping her appointment for your outpatient CT scan.  Future Appointments  Date Time Provider Painter  05/15/2020  2:00 PM GI-315 CT 1 GI-[facility]  06/06/2020  8:00 AM Burnell Blanks, MD [facility]

## 2020-05-08 DIAGNOSIS — H401231 Low-tension glaucoma, bilateral, mild stage: Secondary | ICD-10-CM | POA: Diagnosis not present

## 2020-05-08 DIAGNOSIS — H04123 Dry eye syndrome of bilateral lacrimal glands: Secondary | ICD-10-CM | POA: Diagnosis not present

## 2020-05-08 DIAGNOSIS — H47231 Glaucomatous optic atrophy, right eye: Secondary | ICD-10-CM | POA: Diagnosis not present

## 2020-05-08 DIAGNOSIS — H0102B Squamous blepharitis left eye, upper and lower eyelids: Secondary | ICD-10-CM | POA: Diagnosis not present

## 2020-05-08 DIAGNOSIS — H35033 Hypertensive retinopathy, bilateral: Secondary | ICD-10-CM | POA: Diagnosis not present

## 2020-05-08 DIAGNOSIS — H0102A Squamous blepharitis right eye, upper and lower eyelids: Secondary | ICD-10-CM | POA: Diagnosis not present

## 2020-05-09 DIAGNOSIS — H401231 Low-tension glaucoma, bilateral, mild stage: Secondary | ICD-10-CM | POA: Diagnosis not present

## 2020-05-09 DIAGNOSIS — H35033 Hypertensive retinopathy, bilateral: Secondary | ICD-10-CM | POA: Diagnosis not present

## 2020-05-09 DIAGNOSIS — H16223 Keratoconjunctivitis sicca, not specified as Sjogren's, bilateral: Secondary | ICD-10-CM | POA: Diagnosis not present

## 2020-05-09 DIAGNOSIS — H0102B Squamous blepharitis left eye, upper and lower eyelids: Secondary | ICD-10-CM | POA: Diagnosis not present

## 2020-05-09 DIAGNOSIS — H524 Presbyopia: Secondary | ICD-10-CM | POA: Diagnosis not present

## 2020-05-09 DIAGNOSIS — H0102A Squamous blepharitis right eye, upper and lower eyelids: Secondary | ICD-10-CM | POA: Diagnosis not present

## 2020-05-13 ENCOUNTER — Other Ambulatory Visit: Payer: Self-pay

## 2020-05-13 DIAGNOSIS — R9389 Abnormal findings on diagnostic imaging of other specified body structures: Secondary | ICD-10-CM

## 2020-05-14 DIAGNOSIS — D509 Iron deficiency anemia, unspecified: Secondary | ICD-10-CM | POA: Diagnosis not present

## 2020-05-14 DIAGNOSIS — C8581 Other specified types of non-Hodgkin lymphoma, lymph nodes of head, face, and neck: Secondary | ICD-10-CM | POA: Diagnosis not present

## 2020-05-14 DIAGNOSIS — C8510 Unspecified B-cell lymphoma, unspecified site: Secondary | ICD-10-CM | POA: Diagnosis not present

## 2020-05-14 DIAGNOSIS — C8518 Unspecified B-cell lymphoma, lymph nodes of multiple sites: Secondary | ICD-10-CM | POA: Diagnosis not present

## 2020-05-15 ENCOUNTER — Ambulatory Visit
Admission: RE | Admit: 2020-05-15 | Discharge: 2020-05-15 | Disposition: A | Discharge status: Home / Self Care | Payer: Medicare HMO | Source: Ambulatory Visit | Attending: Nurse Practitioner | Admitting: Nurse Practitioner

## 2020-05-15 ENCOUNTER — Other Ambulatory Visit: Payer: Self-pay

## 2020-05-15 DIAGNOSIS — I2510 Atherosclerotic heart disease of native coronary artery without angina pectoris: Secondary | ICD-10-CM | POA: Diagnosis not present

## 2020-05-15 DIAGNOSIS — R9389 Abnormal findings on diagnostic imaging of other specified body structures: Secondary | ICD-10-CM

## 2020-05-15 DIAGNOSIS — J479 Bronchiectasis, uncomplicated: Secondary | ICD-10-CM | POA: Diagnosis not present

## 2020-05-15 DIAGNOSIS — Z8546 Personal history of malignant neoplasm of prostate: Secondary | ICD-10-CM | POA: Diagnosis not present

## 2020-05-15 DIAGNOSIS — M48061 Spinal stenosis, lumbar region without neurogenic claudication: Secondary | ICD-10-CM | POA: Diagnosis not present

## 2020-05-15 DIAGNOSIS — Z9889 Other specified postprocedural states: Secondary | ICD-10-CM | POA: Diagnosis not present

## 2020-05-15 DIAGNOSIS — J9811 Atelectasis: Secondary | ICD-10-CM | POA: Diagnosis not present

## 2020-05-15 DIAGNOSIS — Z981 Arthrodesis status: Secondary | ICD-10-CM | POA: Diagnosis not present

## 2020-05-15 MED ORDER — IOPAMIDOL (ISOVUE-300) INJECTION 61%
75.0000 mL | Freq: Once | INTRAVENOUS | Status: AC | PRN
  Administered 2020-05-15: 75 mL via INTRAVENOUS

## 2020-05-16 ENCOUNTER — Telehealth: Payer: Self-pay | Admitting: Nurse Practitioner

## 2020-05-16 NOTE — Telephone Encounter (Signed)
Inbound call from patient requesting a call back from a nurse please.  States he is having symptoms and wants to know what to do.  Please advise.

## 2020-05-21 ENCOUNTER — Encounter: Payer: Self-pay | Admitting: Internal Medicine

## 2020-05-21 DIAGNOSIS — D47Z9 Other specified neoplasms of uncertain behavior of lymphoid, hematopoietic and related tissue: Secondary | ICD-10-CM

## 2020-05-21 HISTORY — DX: Other specified neoplasms of uncertain behavior of lymphoid, hematopoietic and related tissue: D47.Z9

## 2020-05-21 NOTE — Telephone Encounter (Signed)
I spoke to the patient and explained that he has enlarged lymph nodes in the chest and mediastinum as well as liver lesions.  Chart review on care everywhere tells me he has stage IV B-cell lymphoma low-grade.  He is followed at [facility] for this.  When I talked to him about this he was a little unclear but then did recognize that he has lymphoma and he has an appointment next week.  He has received Rituxan treatment and it does look like the lymphadenopathy is less on comparative imaging.  He is better on MiraLAX less abdominal pain and moving his bowels.  No further GI follow-up or work-up needed at this time.  We will be on standby.  I did update his medical record to put this on his problem list and history.  The CT report incorrectly states he had a melanoma taken off his eye.  Beth please contact radiology and get that corrected to indicate that he has had lymphoma on his eyelid treated.

## 2020-05-21 NOTE — Telephone Encounter (Signed)
Patient states he was constipated and took Miralax with good results. He is waiting to hear on the CT results and what is the plan.

## 2020-05-22 NOTE — Telephone Encounter (Signed)
Thank you Dr Carlean Purl. I have contacted the radiology department and spoke with [facility]. She will notify the reading physician.

## 2020-05-27 DIAGNOSIS — M5116 Intervertebral disc disorders with radiculopathy, lumbar region: Secondary | ICD-10-CM | POA: Diagnosis not present

## 2020-05-27 DIAGNOSIS — M4326 Fusion of spine, lumbar region: Secondary | ICD-10-CM | POA: Diagnosis not present

## 2020-05-28 DIAGNOSIS — Z5111 Encounter for antineoplastic chemotherapy: Secondary | ICD-10-CM | POA: Diagnosis not present

## 2020-05-28 DIAGNOSIS — C884 Extranodal marginal zone B-cell lymphoma of mucosa-associated lymphoid tissue [MALT-lymphoma]: Secondary | ICD-10-CM | POA: Diagnosis not present

## 2020-05-28 DIAGNOSIS — Z79899 Other long term (current) drug therapy: Secondary | ICD-10-CM | POA: Diagnosis not present

## 2020-06-03 DIAGNOSIS — M5116 Intervertebral disc disorders with radiculopathy, lumbar region: Secondary | ICD-10-CM | POA: Diagnosis not present

## 2020-06-04 ENCOUNTER — Other Ambulatory Visit: Payer: Self-pay | Admitting: Family Medicine

## 2020-06-04 NOTE — Addendum Note (Signed)
Addended by: Girtha Rm on: 06/04/2020 08:45 PM   Modules accepted: Level of Service

## 2020-06-06 ENCOUNTER — Encounter: Payer: Self-pay | Admitting: Cardiovascular Disease

## 2020-06-06 ENCOUNTER — Other Ambulatory Visit: Payer: Self-pay

## 2020-06-06 ENCOUNTER — Ambulatory Visit: Payer: Medicare HMO | Admitting: Cardiovascular Disease

## 2020-06-06 ENCOUNTER — Encounter (INDEPENDENT_AMBULATORY_CARE_PROVIDER_SITE_OTHER): Payer: Self-pay

## 2020-06-06 VITALS — BP 116/70 | HR 62 | Ht 69.0 in | Wt 246.0 lb

## 2020-06-06 DIAGNOSIS — I25118 Atherosclerotic heart disease of native coronary artery with other forms of angina pectoris: Secondary | ICD-10-CM

## 2020-06-06 DIAGNOSIS — E782 Mixed hyperlipidemia: Secondary | ICD-10-CM | POA: Diagnosis not present

## 2020-06-06 DIAGNOSIS — I10 Essential (primary) hypertension: Secondary | ICD-10-CM

## 2020-06-06 MED ORDER — ISOSORBIDE MONONITRATE ER 30 MG PO TB24: 30.0000 mg | ORAL_TABLET | Freq: Every day | ORAL | 3 refills | Status: DC

## 2020-06-06 MED ORDER — PRAVASTATIN SODIUM 40 MG PO TABS: 40.0000 mg | ORAL_TABLET | Freq: Every evening | ORAL | 3 refills | Status: DC

## 2020-06-06 NOTE — Patient Instructions (Signed)
Medication Instructions:  Your physician has recommended you make the following change in your medication:  1.) please restart pravastatin 40 mg every evening  *If you need a refill on your cardiac medications before your next appointment, please call your pharmacy*   Lab Work: In 12 weeks please return for fasting blood work (lipids/liver function)  If you have labs (blood work) drawn today and your tests are completely normal, you will receive your results only by: Marland Kitchen MyChart Message (if you have MyChart) OR . A paper copy in the mail If you have any lab test that is abnormal or we need to change your treatment, we will call you to review the results.   Testing/Procedures: none   Follow-Up: At [facility], you and your health needs are our priority.  As part of our continuing mission to provide you with exceptional heart care, we have created designated Provider Care Teams.  These Care Teams include your primary Cardiologist (physician) and Advanced Practice Providers (APPs -  Physician Assistants and Nurse Practitioners) who all work together to provide you with the care you need, when you need it.  We recommend signing up for the patient portal called "MyChart".  Sign up information is provided on this After Visit Summary.  MyChart is used to connect with patients for Virtual Visits (Telemedicine).  Patients are able to view lab/test results, encounter notes, upcoming appointments, etc.  Non-urgent messages can be sent to your provider as well.   To learn more about what you can do with MyChart, go to [email or web address].    Your next appointment:   12 month(s)  The format for your next appointment:   In Person  Provider:   You may see Lauree Chandler, MD or one of the following Advanced Practice Providers on your designated Care Team:    Melina Copa, PA-C  Ermalinda Barrios, PA-C   Other Instructions

## 2020-06-06 NOTE — Progress Notes (Signed)
Chief Complaint  Patient presents with  . Follow-up    CAD    History of Present Illness: 80 yo male with history of HTN, HLD, CAD, PAD, DM, MALT of orbit  who is here today for cardiac follow up. He has been followed in the past by Dr. Lia Foyer. Cardiac cath February 2014  severe OM2 stenosis treated with bare metal stent. Admitted to [facility] 11/09/12 with chest pain. Ruled out for MI. Lexiscan stress myoview 11/16/12 without ischemia. Normal LVEF.  I saw him in the office 07/27/13 and he c/o exertional chest pain c/w unstable angina. Cardiac cath 08/01/13 with patent stent Circumflex and mild to moderate disease in the LAD and RCA. His PAD is followed in [facility] by Dr. Scot Dock. He was seen here 03/04/14 and had c/o chest pain after working on his car but quickly resolved. He was seen in the ED 09/10/14 with atypical chest pain. Troponin was negative x 2 and EKG was unchanged. Multiple ED visits with chest pain in 2017. Nuclear stress test November 2018 with  EF 55% with moderate inferior defect that partially improves consistent with ischemia and possible soft tissue attenuation. Small region of anterior anterior septal ischemia. Diagnosed with prostate cancer in 2019. I saw him in the ED 07/15/17 and he had atypical chest pain with negative troponin and no EKG changes. He was seen in my office 08/01/17 and doing well. Most recently seen in the ED 01/28/18 with atypical chest pain, negative troponin. He was seen in the ED October 2019, April 2020, May 2020 and June 2020 with atypical chest pain.Cardiac cath April 2021 with moderate diffuse CAD but no focally obstructive lesions except in the PDA which is too small for stenting. He was seen in the ED 05/06/20 with chest pain, troponin negative. 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   He is here today for follow up. The patient denies any chest pain, dyspnea, palpitations, lower extremity edema, orthopnea, PND, dizziness, near  syncope or syncope.     Primary Care Physician:  Girtha Rm, NP-C  Past Medical History:  Diagnosis Date  . Atypical chest pain   . CAD (coronary artery disease)    a. Multiple prior evaluations then 05/2012 s/p BMS to OM2. b. Nuc 11/2012 wnl. c. Cath 2015: patent stent, moderate LAD/RCA dz, treated medically.  . Cataract   . CKD (chronic kidney disease), stage II   . COPD (chronic obstructive pulmonary disease) ([place])   . Cough secondary to angiotensin converting enzyme inhibitor (ACE-I)   . Diabetes mellitus without complication ([place])   . Diverticulosis   . DVT (deep venous thrombosis) ([place])   . Family history of breast cancer in first degree relative   . GERD (gastroesophageal reflux disease)   . Headache(784.0)    Hx: of  . History of blood transfusion    "I've had 2; don't know what it was related to" (11/09/2012)  . HLD (hyperlipidemia)   . HTN (hypertension)   . Iron deficiency anemia   . Lymphoproliferative disorder, low grade B cell ([place]) 05/21/2020  . Microcytic anemia   . Peripheral vascular disease ([place])    a. L pop-tibial bypass 2011, followed by [facility].  . Personal history of prostate cancer   . Rotator cuff injury    "left arm; never repaired" (11/09/2012)    Past Surgical History:  Procedure Laterality Date  . CARDIOVASCULAR STRESS TEST  Aug. 2014  . CATARACT EXTRACTION  W/ INTRAOCULAR LENS IMPLANT Right ~ 2008  . CIRCUMCISION    . COLONOSCOPY    . CORONARY ANGIOPLASTY WITH STENT PLACEMENT  2014   "1" (11/09/2012)  . ILIAC ARTERY ANEURYSM REPAIR    . LEFT HEART CATH  05-31-12  . LEFT HEART CATH AND CORONARY ANGIOGRAPHY N/A 08/01/2019   Procedure: LEFT HEART CATH AND CORONARY ANGIOGRAPHY;  Surgeon: Burnell Blanks, MD;  Location: [facility];  Service: Cardiovascular;  Laterality: N/A;  . LEFT HEART CATHETERIZATION WITH CORONARY ANGIOGRAM N/A 06/01/2011   Procedure: LEFT HEART CATHETERIZATION WITH CORONARY ANGIOGRAM;  Surgeon: Hillary Bow, MD;   Location[facility] CATH LAB;  Service: Cardiovascular;  Laterality: N/A;  . LEFT HEART CATHETERIZATION WITH CORONARY ANGIOGRAM N/A 05/31/2012   Procedure: LEFT HEART CATHETERIZATION WITH CORONARY ANGIOGRAM;  Surgeon: Peter M Martinique, MD;  Location: [facility] CATH LAB;  Service: Cardiovascular;  Laterality: N/A;  . LEFT HEART CATHETERIZATION WITH CORONARY ANGIOGRAM N/A 08/01/2013   Procedure: LEFT HEART CATHETERIZATION WITH CORONARY ANGIOGRAM;  Surgeon: Burnell Blanks, MD;  Location: [facility] CATH LAB;  Service: Cardiovascular;  Laterality: N/A;  . PERCUTANEOUS CORONARY STENT INTERVENTION (PCI-S)  05/31/2012   Procedure: PERCUTANEOUS CORONARY STENT INTERVENTION (PCI-S);  Surgeon: Peter M Martinique, MD;  Location: [facility] CATH LAB;  Service: Cardiovascular;;  . PR VEIN BYPASS GRAFT,AORTO-FEM-POP Left 10/01/2009   left below knee popliteal artery to posterior tibial artery  . SHOULDER ARTHROSCOPY WITH OPEN ROTATOR CUFF REPAIR AND DISTAL CLAVICLE ACROMINECTOMY Left 01/12/2013   Procedure: LEFT SHOULDER ARTHROSCOPY WITH DEBRIDEMENT OPEN DISTAL CLAVICLE RESECTION ,acromioplastyAND ROTATOR CUFF REPAIR;  Surgeon: Yvette Rack., MD;  Location: [place];  Service: Orthopedics;  Laterality: Left;    Current Outpatient Medications  Medication Sig Dispense Refill  . abiraterone acetate (ZYTIGA) 250 MG tablet Take 1,000 mg by mouth daily.     . Accu-Chek FastClix Lancets MISC Test once daily 102 each 12  . acetaminophen (TYLENOL) 500 MG tablet Take 1,000 mg by mouth every 6 (six) hours as needed for mild pain or headache.    . albuterol (VENTOLIN HFA) 108 (90 Base) MCG/ACT inhaler INHALE 2 PUFFS INTO THE LUNGS EVERY 6 HOURS AS NEEDED FOR WHEEZING OR SHORTNESS OF BREATH (Patient taking differently: Inhale 2 puffs into the lungs every 4 (four) hours as needed for wheezing or shortness of breath.) 54 g 0  . amLODipine (NORVASC) 10 MG tablet Take 1 tablet (10 mg total) by mouth daily. 90 tablet 3  . aspirin EC 81 MG tablet Take 81 mg by  mouth daily.     Marland Kitchen gabapentin (NEURONTIN) 600 MG tablet Take 600 mg by mouth at bedtime.     Marland Kitchen latanoprost (XALATAN) 0.005 % ophthalmic solution Place 1 drop into both eyes at bedtime.    Marland Kitchen loratadine (CLARITIN) 10 MG tablet Take 10 mg by mouth daily.    Marland Kitchen losartan-hydrochlorothiazide (HYZAAR) 100-25 MG tablet TAKE 1 TABLET BY MOUTH DAILY 90 tablet 0  . nitroGLYCERIN (NITROSTAT) 0.4 MG SL tablet Place 1 tablet (0.4 mg total) under the tongue every 5 (five) minutes as needed for chest pain (max 3 doses). 25 tablet 0  . omeprazole (PRILOSEC) 20 MG capsule Take 20 mg by mouth in the morning and at bedtime.    . pravastatin (PRAVACHOL) 40 MG tablet Take 1 tablet (40 mg total) by mouth every evening. 90 tablet 3  . RESTASIS 0.05 % ophthalmic emulsion Place 1 drop into both eyes 2 (two) times daily.    Marland Kitchen  traMADol (ULTRAM) 50 MG tablet TAKE 1 TABLET(50 MG) BY MOUTH EVERY 8 HOURS AS NEEDED FOR SEVERE PAIN (Patient taking differently: Take 50 mg by mouth every 8 (eight) hours as needed for moderate pain.) 30 tablet 0  . isosorbide mononitrate (IMDUR) 30 MG 24 hr tablet Take 1 tablet (30 mg total) by mouth daily. 90 tablet 3   No current facility-administered medications for this visit.    Allergies  Allergen Reactions  . Topiramate Shortness Of Breath and Other (See Comments)     leg cramps  . Atenolol Other (See Comments)    bradycardia  . Lisinopril Cough  . Tramadol-Acetaminophen Cough    Tolerates plain Tramadol    Social History   Socioeconomic History  . Marital status: Widowed    Spouse name: Not on file  . Number of children: Not on file  . Years of education: Not on file  . Highest education level: 12th grade  Occupational History  . Occupation: Retired  Tobacco Use  . Smoking status: Former Smoker    Packs/day: 0.50    Years: 52.00    Pack years: 26.00    Types: Cigarettes    Quit date: 05/26/2011    Years since quitting: 9.0  . Smokeless tobacco: Never Used  Vaping Use   . Vaping Use: Never used  Substance and Sexual Activity  . Alcohol use: No  . Drug use: No  . Sexual activity: Not on file  Other Topics Concern  . 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  Lives in [place] by himself.      Patient writes with his right hand, though uses his left hand for every thing else. He lives alone in a one level home. He drinks 3 cups of coffee a day and 3 7-8 oz sodas a day. He does not exercise.   Social Determinants of Health   Financial Resource Strain: Not on file  Food Insecurity: Not on file  Transportation Needs: Not on file  Physical Activity: Not on file  Stress: Not on file  Social Connections: Not on file  Intimate Partner Violence: Not on file    Family History  Problem Relation Age of Onset  . Breast cancer Mother        dx 14s  . Hyperlipidemia Mother   . Hypertension Mother   . COPD Father        deceased  . Hyperlipidemia Father   . Hypertension Father   . Hypertension Sister   . Cancer Brother 52       Unknown cancer, possibly stomach  . Hypertension Brother   . Cancer Brother 21       Unknown, possibly lung  . Hypertension Daughter   . Hypertension Son   . Colon cancer Neg Hx        pt is unclear about family hx    Review of Systems:  As stated in the HPI and otherwise negative.   BP 116/70   Pulse 62   Ht 5\' 9"  (1.753 m)   Wt 246 lb (111.6 kg)   SpO2 99%   BMI 36.33 kg/m   Physical Examination:  General: Well developed, well nourished, NAD  HEENT: OP clear, mucus membranes moist  SKIN: warm, dry. No rashes. Neuro: No focal deficits  Musculoskeletal: Muscle strength 5/5 all ext  Psychiatric: Mood and affect normal  Neck: No JVD, no carotid bruits, no thyromegaly, no lymphadenopathy.  Lungs:Clear  bilaterally, no wheezes, rhonci, crackles Cardiovascular: Regular rate and rhythm. No murmurs, gallops or rubs. Abdomen:Soft. Bowel sounds present. Non-tender.   Extremities: No lower extremity edema. Pulses are 2 + in the bilateral DP/PT.  Cardiac cath 08/01/13: Left main: No obstructive disease.  Left Anterior Descending Artery: Large caliber vessel that courses to the apex. The proximal and mid vessel is ectatic. There is diffuse 30-40% stenosis in the mid vessel. There are mild luminal irregularities in the distal vessel.  Circumflex Artery: Large caliber vessel with two obtuse marginal branches. The first OM branch is small in caliber and is chronically occluded in the mid vessel. The second OM branch is patent with patent stent, minimal restenosis. The distal segment of the OM branch has diffuse plaque.  Right Coronary Artery: Large caliber dominant vessel with proximal Shepherd's crook. The proximal vessel has diffuse 40% stenosis. The mid vessel is ectatic with 40% stenosis. The distal vessel has diffuse 50% stenosis. The PDA has 50% proximal stenosis followed by diffuse plaque.  Left Ventricular Angiogram: LVEF=65%.   EKG:  EKG is not ordered today. The ekg ordered today demonstrates   Recent Labs: 02/14/2020: Magnesium 2.0 04/06/2020: ALT 15 05/06/2020: BUN 11; Creatinine, Ser 1.16; Hemoglobin 9.3; Platelets 210; Potassium 3.0; Sodium 137    Wt Readings from Last 3 Encounters:  06/06/20 246 lb (111.6 kg)  05/06/20 254 lb (115.2 kg)  04/23/20 258 lb (117 kg)     Other studies Reviewed: Additional studies/ records that were reviewed today include: . Review of the above records demonstrates:    Assessment and Plan:   1. CAD with unstable angina:Most recent cath April 2021 with stable CAD, severe stenosis in small PDA that is too small for stenting. Otherwise mild to moderate disease in all three vessels. Will continue ASA, Norvasc and Imdur. Restart statin.   2. HTN:BP is well controlled. Continue current therapy  3. Hyperlipidemia:Lipids followed in primary care.He is not sure why but he has been off of Pravastatin. Will restart  Pravastatin 40 mg daily. Check lipids and LFTs in 12 weeks.     Current medicines are reviewed at length with the patient today.  The patient does not have concerns regarding medicines.  The following changes have been made:  no change  Labs/ tests ordered today include:   Orders Placed This Encounter  Procedures  . Lipid panel  . Hepatic function panel    Disposition:   F/U with me in one year.   Signed, Lauree Chandler, MD 06/06/2020 8:47 AM    [facility] [place], [place], [place]  [address] Phone: [phone]; Fax: [phone]

## 2020-06-13 DIAGNOSIS — I723 Aneurysm of iliac artery: Secondary | ICD-10-CM | POA: Diagnosis not present

## 2020-06-17 DIAGNOSIS — M5116 Intervertebral disc disorders with radiculopathy, lumbar region: Secondary | ICD-10-CM | POA: Diagnosis not present

## 2020-06-17 DIAGNOSIS — M4326 Fusion of spine, lumbar region: Secondary | ICD-10-CM | POA: Diagnosis not present

## 2020-07-02 DIAGNOSIS — H401230 Low-tension glaucoma, bilateral, stage unspecified: Secondary | ICD-10-CM | POA: Diagnosis not present

## 2020-07-02 DIAGNOSIS — H35033 Hypertensive retinopathy, bilateral: Secondary | ICD-10-CM | POA: Diagnosis not present

## 2020-07-02 DIAGNOSIS — H0102A Squamous blepharitis right eye, upper and lower eyelids: Secondary | ICD-10-CM | POA: Diagnosis not present

## 2020-07-02 DIAGNOSIS — H16223 Keratoconjunctivitis sicca, not specified as Sjogren's, bilateral: Secondary | ICD-10-CM | POA: Diagnosis not present

## 2020-07-02 DIAGNOSIS — H0102B Squamous blepharitis left eye, upper and lower eyelids: Secondary | ICD-10-CM | POA: Diagnosis not present

## 2020-07-02 DIAGNOSIS — C6982 Malignant neoplasm of overlapping sites of left eye and adnexa: Secondary | ICD-10-CM | POA: Diagnosis not present

## 2020-07-02 DIAGNOSIS — Z961 Presence of intraocular lens: Secondary | ICD-10-CM | POA: Diagnosis not present

## 2020-07-02 DIAGNOSIS — H43811 Vitreous degeneration, right eye: Secondary | ICD-10-CM | POA: Diagnosis not present

## 2020-07-02 LAB — HM DIABETES EYE EXAM

## 2020-07-03 ENCOUNTER — Other Ambulatory Visit: Payer: Self-pay

## 2020-07-03 ENCOUNTER — Ambulatory Visit (INDEPENDENT_AMBULATORY_CARE_PROVIDER_SITE_OTHER): Payer: Medicare HMO | Admitting: Medical

## 2020-07-03 ENCOUNTER — Encounter: Payer: Self-pay | Admitting: Medical

## 2020-07-03 VITALS — BP 126/72 | HR 68 | Ht 69.0 in | Wt 244.8 lb

## 2020-07-03 DIAGNOSIS — J449 Chronic obstructive pulmonary disease, unspecified: Secondary | ICD-10-CM | POA: Diagnosis not present

## 2020-07-03 DIAGNOSIS — R5383 Other fatigue: Secondary | ICD-10-CM | POA: Diagnosis not present

## 2020-07-03 DIAGNOSIS — D47Z9 Other specified neoplasms of uncertain behavior of lymphoid, hematopoietic and related tissue: Secondary | ICD-10-CM

## 2020-07-03 DIAGNOSIS — I739 Peripheral vascular disease, unspecified: Secondary | ICD-10-CM | POA: Diagnosis not present

## 2020-07-03 DIAGNOSIS — Z8546 Personal history of malignant neoplasm of prostate: Secondary | ICD-10-CM

## 2020-07-03 DIAGNOSIS — I2510 Atherosclerotic heart disease of native coronary artery without angina pectoris: Secondary | ICD-10-CM | POA: Diagnosis not present

## 2020-07-03 DIAGNOSIS — D509 Iron deficiency anemia, unspecified: Secondary | ICD-10-CM | POA: Diagnosis not present

## 2020-07-03 DIAGNOSIS — I10 Essential (primary) hypertension: Secondary | ICD-10-CM

## 2020-07-03 NOTE — Progress Notes (Signed)
Subjective:  Joel [name] is a 80 y.o. male who presents for Chief Complaint  Patient presents with  . Fatigue    Pt present for feeling tired all the time but does not feel tired today       Medical team: Dr. Lauree [name], cardiology Dr. Alver [name], hematology /oncology Joel Savoy, [name] and Dr. Silvano [name], GI Dr. Daylene [name], podiatry Joel [name], [name] here for primary care   Here for c/o being tired and wore out feeling when he made the appt, but feels fine today.  He feels absolutely fine today.  Been feeling tired since daylight savins time change recently, but feels refreshed currently.  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   He denies any recent SOB, no chest pain, no edema, no palpations, no abdominal pain, no nausea, no diarrhea, no URI symptoms, no fever, no chills, no body aches, no numbness, no tingling ,no weakness.   diabetes - checking glucose regularly and recent sugars around low 100s.  No reports of hypoglycemia  Hx/o COPD, no recent inhaler use.    No other aggravating or relieving factors.    No other c/o.  Past Medical History:  Diagnosis Date  . Atypical chest pain   . CAD (coronary artery disease)    a. Multiple prior evaluations then 05/2012 s/p BMS to OM2. b. Nuc 11/2012 wnl. c. Cath 2015: patent stent, moderate LAD/RCA dz, treated medically.  . Cataract   . CKD (chronic kidney disease), stage II   . COPD (chronic obstructive pulmonary disease) ([place])   . Cough secondary to angiotensin converting enzyme inhibitor (ACE-I)   . Diabetes mellitus without complication ([place])   . Diverticulosis   . DVT (deep venous thrombosis) ([place])   . Family history of breast cancer in first degree relative   . GERD  (gastroesophageal reflux disease)   . Headache(784.0)    Hx: of  . History of blood transfusion    "I've had 2; don't know what it was related to" (11/09/2012)  . HLD (hyperlipidemia)   . HTN (hypertension)   . Iron deficiency anemia   . Lymphoproliferative disorder, low grade B cell ([place]) 05/21/2020  . Microcytic anemia   . Peripheral vascular disease ([place])    a. L pop-tibial bypass 2011, followed by [facility].  . Personal history of prostate cancer   . Rotator cuff injury    "left arm; never repaired" (11/09/2012)   The following portions of the patient's history were reviewed and updated as appropriate: allergies, current medications, past family history, past medical history, past social history, past surgical history and problem list.  ROS Otherwise as in subjective above    Objective: BP 126/72   Pulse 68   Ht 5\' 9"  (1.753 m)   Wt 244 lb 12.8 oz (111 kg)   SpO2 96%   BMI 36.15 kg/m    Wt Readings from Last 3 Encounters:  07/03/20 244 lb 12.8 oz (111 kg)  06/06/20 246 lb (111.6 kg)  05/06/20 254 lb (115.2 kg)   BP Readings from Last 3 Encounters:  07/03/20 126/72  06/06/20 116/70  05/06/20 (!) 146/68   General appearance: alert, no distress, well developed, well nourished Left upper orbit, upper eyelid region with localized nodular/soft tissue swelling causing some eyelid droop HEENT: normocephalic, sclerae anicteric, conjunctiva pink and moist, TMs  pearly, nares patent, no discharge or erythema, pharynx normal Oral cavity: MMM, no lesions Neck: supple, no lymphadenopathy, no thyromegaly, no masses Heart: RRR, normal S1, S2, no murmurs Lungs: CTA bilaterally, no wheezes, rhonchi, or rales Pulses: 2+ radial pulses, 2+ pedal pulses, normal cap refill Ext: no edema   Assessment: Encounter Diagnoses  Name Primary?  . Chronic obstructive pulmonary disease, unspecified COPD type ([place]) Yes  . Fatigue, unspecified type   . PVD (peripheral vascular disease) with  claudication ([place])   . Personal history of prostate cancer   . Microcytic anemia   . Lymphoproliferative disorder, low grade B cell (HCC)-Stage 4   . Iron deficiency anemia, unspecified iron deficiency anemia type   . Essential hypertension   . Coronary artery disease involving native coronary artery of native heart without angina pectoris      Plan: After discussing his health history and reviewing his chart, he really did not want to do much in the way of evaluation today as he feels that his symptoms have improved since the last few weeks  To be thorough I did review his chart to get a sense of any potential issues brewing  COPD-PFT reviewed today which look pretty good.  He has not had any recent shortness of breath symptoms.  He quit smoking years ago.  He has not had to use inhalers recently, so I doubt his COPD is playing a big role in his recent fatigue  History of prostate cancer-managed by urology  Microcytic anemia-this is being managed by hematology.  This could be a significant reason why he may stay anemic.  Lymphoproliferative disorder-this also could have an impact on his energy levels.  I reviewed recent hematology oncology notes    Reviewed 05/14/20 [facility] medical center notes.  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 We recommended treatment with single agent rituximab given weekly x8 doses. He completed 8th dose on 03/26/20.   End of treatment PET/CT scan reviewed today which shows a partial response to therapy. The orbital mass is still visible but not bothersome. He is asymptomatic from his other disease. I discussed his case with Dr. Cassell Clement and we recommend that he begin Rituxan maintenance every 8 weeks x 2 years.   Of note, if in the future, there is disease progression, would consider addition of lenalidomide  or bendamustine to treatment.  Completed 2 doses of IV iron on 12/15 and 12/22 with improvement in hemoglobin to 9.7 He remains anemic and quite microcytic. Will follow up on iron profile 3 mo from last dose. He is also following with GI in [place] who follows for hx abdominal pain and microcytic anemia. Duodenal biopsies in 2011 for anemia were negative.Unremarkable duodenum on EGD ( no biopsies) in 2018. [facility] show villous blunting. Of note, He doesn't want to take oral iron again due to constipation.   Prior hemoglobin electrophoresis in 02/2020 revealed no abnormal hemoglobins. Case fits with a hemoglobinopathy. Will discuss with benign heme team and may need further testing to rule out alpha thalassemia or other less common hemoglobinopathies. Will obtain necessary studies at upcoming treatment appointments  Will cc his GI team on our notes regarding IV iron intervention and pending anemia work up..    Hypertension-stable, continue on current medication  Coronary artery disease-I reviewed report from his April 2021 left heart catheterization  showing the following: 1. Moderate non-obstructive disease in the LAD and Circumflex 2. Moderate non-obstructive disease in the RCA. Severe disease in the small caliber PDA, too small for PCI 3. Normal LV systolic function  Recommendations per cardiology were to continue medical management of CAD.  I do not think is coronary artery disease is playing a significant role in his recent fatigue symptoms   Peripheral vascular disease-reviewed ABIs from July 2019, mild right lower extremity arterial disease, otherwise normal ABIs.  Diabetes-diet controlled, return soon for physical with fasting labs  CKD-return soon for physical and fasting labs   He will follow-up soon for routine physical and well visit with PCP here.   At that time they can get a baseline thyroid lab along with routine labs  Leelyn was seen today for  fatigue.  Diagnoses and all orders for this visit:  Chronic obstructive pulmonary disease, unspecified COPD type ([place]) -     Spirometry with Graph  Fatigue, unspecified type  PVD (peripheral vascular disease) with claudication (HCC)  Personal history of prostate cancer  Microcytic anemia  Lymphoproliferative disorder, low grade B cell (HCC)-Stage 4  Iron deficiency anemia, unspecified iron deficiency anemia type  Essential hypertension  Coronary artery disease involving native coronary artery of native heart without angina pectoris   F/u as planned for fasting physical here with PCP.

## 2020-07-09 ENCOUNTER — Other Ambulatory Visit: Payer: Self-pay | Admitting: Cardiovascular Disease

## 2020-07-09 DIAGNOSIS — D509 Iron deficiency anemia, unspecified: Secondary | ICD-10-CM | POA: Diagnosis not present

## 2020-07-09 DIAGNOSIS — C884 Extranodal marginal zone B-cell lymphoma of mucosa-associated lymphoid tissue [MALT-lymphoma]: Secondary | ICD-10-CM | POA: Diagnosis not present

## 2020-07-09 DIAGNOSIS — Z79899 Other long term (current) drug therapy: Secondary | ICD-10-CM | POA: Diagnosis not present

## 2020-07-09 DIAGNOSIS — Z803 Family history of malignant neoplasm of breast: Secondary | ICD-10-CM | POA: Diagnosis not present

## 2020-07-12 ENCOUNTER — Other Ambulatory Visit: Payer: Self-pay

## 2020-07-12 DIAGNOSIS — I714 Abdominal aortic aneurysm, without rupture, unspecified: Secondary | ICD-10-CM

## 2020-07-12 DIAGNOSIS — I739 Peripheral vascular disease, unspecified: Secondary | ICD-10-CM

## 2020-07-15 DIAGNOSIS — M5116 Intervertebral disc disorders with radiculopathy, lumbar region: Secondary | ICD-10-CM | POA: Diagnosis not present

## 2020-07-15 DIAGNOSIS — M4326 Fusion of spine, lumbar region: Secondary | ICD-10-CM | POA: Diagnosis not present

## 2020-07-16 NOTE — Progress Notes (Signed)
Joel [name] is a 80 y.o. male who presents for annual wellness visit and follow-up on chronic medical conditions.  He has the following concerns:  Constipation and then diarrhea with daily Miralax. States he has Metamucil at home but has not been taking it. Plans to see GI at the [facility] also.   PVD- Has appt with Dr. Scot Dock next week   COPD- denies any recent flares or use of albuterol.     Immunization History  Administered Date(s) Administered  . Fluad Quad(high Dose 65+) 01/10/2019  . Influenza, High Dose Seasonal PF 02/08/2017, 01/20/2018  . Influenza-Unspecified 01/23/2020  . Moderna Sars-Covid-2 Vaccination 05/16/2019, 06/13/2019, 02/12/2020  . Pneumococcal-Unspecified 01/23/2020   Last colonoscopy: 2019 Last PSA: 2022 Dentist: years ago Ophtho: [facility]- march 2022 Exercise:  Very little   Other doctors caring for patient include: [facility] Bobette Mo- Vascular Cardiology- Dr. Angelena Form Spine specialist- not sure of the Dr. Name Urologist- in [place]  Dr. Carlean Purl- GI   Depression screen:  See questionnaire below.     Depression screen [facility] 2/9 07/17/2020 09/11/2018 08/24/2017  Decreased Interest 0 0 0  Down, Depressed, Hopeless 0 0 0  PHQ - 2 Score 0 0 0  Some recent data might be hidden    Fall Screen: See Questionaire below.   Fall Risk  07/17/2020 09/11/2018 01/31/2018 08/24/2017 06/05/2014  Falls in the past year? 0 0 No No No  Number falls in past yr: 0 0 - - -  Injury with Fall? 0 0 - - -  Risk for fall due to : No Fall Risks - - - -  Follow up Falls evaluation completed - - - -    ADL screen:  See questionnaire below.  Functional Status Survey: Is the patient deaf or have difficulty hearing?: No Does the patient have difficulty seeing, even when wearing glasses/contacts?: No Does the patient have difficulty concentrating, remembering, or making decisions?: No Does the patient have difficulty walking or climbing  stairs?: Yes (recent back surgery) Does the patient have difficulty dressing or bathing?: No Does the patient have difficulty doing errands alone such as visiting a doctor's office or shopping?: No   End of Life Discussion: Patient does not have a living will and medical power of attorney. daughter Meredith Mody is who he plans to have as his documented HCPOA.MOST form filled out.  Review of Systems  Constitutional: -fever, -chills, -sweats, -unexpected weight change, -anorexia, -fatigue Allergy: -sneezing, -itching, -congestion Dermatology: denies changing moles, rash, lumps, new worrisome lesions ENT: -runny nose, -ear pain, -sore throat, -hoarseness, -sinus pain, -teeth pain, -tinnitus, -hearing loss, -epistaxis Cardiology:  -chest pain, -palpitations, -edema, -orthopnea, -paroxysmal nocturnal dyspnea Respiratory: -cough, -shortness of breath, -dyspnea on exertion, -wheezing, -hemoptysis Gastroenterology: -abdominal pain, -nausea, -vomiting, +diarrhea, +constipation, -blood in stool, -changes in bowel movement, -dysphagia Hematology: -bleeding or bruising problems Musculoskeletal: -arthralgias, -myalgias, -joint swelling, -back pain, -neck pain, -cramping, -gait changes Ophthalmology: -vision changes, -eye redness, -itching, -discharge Urology: -dysuria, -difficulty urinating, -hematuria, -urinary frequency, -urgency, -incontinence Neurology: -headache, -weakness, -tingling, -numbness, -speech abnormality, -memory loss, -falls, -dizziness Psychology:  -depressed mood, -agitation, -sleep problems   PHYSICAL EXAM:  BP (!) 110/58   Pulse 63   Ht 5\' 9"  (1.753 m)   Wt 240 lb 6.4 oz (109 kg)   BMI 35.50 kg/m   General Appearance: Alert, cooperative, no distress, appears stated age Head: Normocephalic, without obvious abnormality, atraumatic Eyes: PERRL, conjunctiva/corneas clear, EOM's intact Ears: Normal TM's  and external ear canals Nose: mask on  Throat: mask on  Neck: Supple,  no lymphadenopathy, thyroid:no enlargement/tenderness/nodules Back: Spine nontender, normal ROM Lungs: Clear to auscultation bilaterally without wheezes, rales or ronchi; respirations unlabored Chest Wall: No tenderness or deformity Heart: Regular rate and rhythm Breast Exam: No chest wall tenderness, masses or gynecomastia Abdomen: Soft, non-tender, nondistended, normoactive bowel sounds, no masses, no hepatosplenomegaly Genitalia: declines  Extremities: No clubbing, cyanosis or edema Pulses: 2+ upper extremities  Skin: Skin color, texture, turgor normal, no rashes or lesions Lymph nodes: Cervical, supraclavicular, and axillary nodes normal Neurologic: CNII-XII intact, normal strength, sensation and gait  Psych: Normal mood, affect, hygiene and grooming  ASSESSMENT/PLAN: Medicare annual wellness visit, subsequent -denies any issues with depression, ADLs or memory. No falls. Lives alone and in good spirits. Drives.  Daughter is his 27. Advance directives discussed. Medications reviewed.  He is under the care of several specialists including at the [place].   Routine general medical examination at a health care facility - Plan: CBC with Differential/Platelet, Comprehensive metabolic panel -preventive healthcare reviewed. Immunizations reviewed. Encouraged increasing activity level. Discussed safety.   PVD (peripheral vascular disease) with claudication ([place]) - Plan: Lipid panel -on statin. Under the care of vascular surgeon.   Personal history of prostate cancer -managed by urologist   Prediabetes - Plan: CBC with Differential/Platelet, Comprehensive metabolic panel, Hemoglobin A1c, TSH -check labs and follow up  Chronic obstructive pulmonary disease, unspecified COPD type ([place]) -no concerns and has not needed albuterol in weeks.   Aortic atherosclerosis ([place]) - Plan: Lipid panel -continue statin. Has upcoming appt with cardiologist and vascular     Discussed PSA screening  (risks/benefits), recommended at least 30 minutes of aerobic activity at least 5 days/week; proper sunscreen use reviewed; healthy diet and alcohol recommendations (less than or equal to 2 drinks/day) reviewed; regular seatbelt use; changing batteries in smoke detectors. Immunization recommendations discussed.  Colonoscopy recommendations reviewed.   Medicare Attestation I have personally reviewed: The patient's medical and social history Their use of alcohol, tobacco or illicit drugs Their current medications and supplements The patient's functional ability including ADLs,fall risks, home safety risks, cognitive, and hearing and visual impairment Diet and physical activities Evidence for depression or mood disorders  The patient's weight, height, and BMI have been recorded in the chart.  I have made referrals, counseling, and provided education to the patient based on review of the above and I have provided the patient with a written personalized care plan for preventive services.     [name], NP-C   07/17/2020

## 2020-07-17 ENCOUNTER — Encounter: Payer: Self-pay | Admitting: Family Medicine

## 2020-07-17 ENCOUNTER — Ambulatory Visit (INDEPENDENT_AMBULATORY_CARE_PROVIDER_SITE_OTHER): Payer: Medicare HMO | Admitting: Family Medicine

## 2020-07-17 ENCOUNTER — Other Ambulatory Visit: Payer: Self-pay

## 2020-07-17 VITALS — BP 110/58 | HR 63 | Ht 69.0 in | Wt 240.4 lb

## 2020-07-17 DIAGNOSIS — I739 Peripheral vascular disease, unspecified: Secondary | ICD-10-CM | POA: Diagnosis not present

## 2020-07-17 DIAGNOSIS — Z0000 Encounter for general adult medical examination without abnormal findings: Secondary | ICD-10-CM

## 2020-07-17 DIAGNOSIS — I700 Atherosclerosis of aorta: Secondary | ICD-10-CM

## 2020-07-17 DIAGNOSIS — Z8546 Personal history of malignant neoplasm of prostate: Secondary | ICD-10-CM | POA: Diagnosis not present

## 2020-07-17 DIAGNOSIS — R7303 Prediabetes: Secondary | ICD-10-CM

## 2020-07-17 DIAGNOSIS — J449 Chronic obstructive pulmonary disease, unspecified: Secondary | ICD-10-CM

## 2020-07-17 NOTE — Patient Instructions (Signed)
  Joel [name] , Thank you for taking time to come for your Medicare Wellness Visit. I appreciate your ongoing commitment to your health goals. Please review the following plan we discussed and let me know if I can assist you in the future.   These are the goals we discussed:  Continue your current medications but consider cutting back on the Miralax and try Metamucil or Fibercon. Follow up with your GI doctor.   Continue seeing your specialists.   I will be in touch with your lab results.     This is a list of the screening recommended for you and due dates:  Health Maintenance  Topic Date Due  . Complete foot exam   Never done  . Eye exam for diabetics  09/21/2017  . Hemoglobin A1C  07/10/2019  . COVID-19 Vaccine (4 - Booster for Moderna series) 08/11/2020  . Flu Shot  11/10/2020  . Tetanus Vaccine  12/22/2025  . Pneumonia vaccines  Completed  . HPV Vaccine  Aged Out

## 2020-07-18 LAB — COMPREHENSIVE METABOLIC PANEL
ALT: 10 IU/L (ref 0–44)
AST: 17 IU/L (ref 0–40)
Albumin/Globulin Ratio: 1.8 (ref 1.2–2.2)
Albumin: 4.2 g/dL (ref 3.7–4.7)
Alkaline Phosphatase: 99 IU/L (ref 44–121)
BUN/Creatinine Ratio: 13 (ref 10–24)
BUN: 12 mg/dL (ref 8–27)
Bilirubin Total: 0.3 mg/dL (ref 0.0–1.2)
CO2: 22 mmol/L (ref 20–29)
Calcium: 9.4 mg/dL (ref 8.6–10.2)
Chloride: 99 mmol/L (ref 96–106)
Creatinine, Ser: 0.95 mg/dL (ref 0.76–1.27)
Globulin, Total: 2.4 g/dL (ref 1.5–4.5)
Glucose: 149 mg/dL — ABNORMAL HIGH (ref 65–99)
Potassium: 4.2 mmol/L (ref 3.5–5.2)
Sodium: 139 mmol/L (ref 134–144)
Total Protein: 6.6 g/dL (ref 6.0–8.5)
eGFR: 81 mL/min/{1.73_m2} (ref >59)

## 2020-07-18 LAB — LIPID PANEL
Chol/HDL Ratio: 3 ratio (ref 0.0–5.0)
Cholesterol, Total: 131 mg/dL (ref 100–199)
HDL: 43 mg/dL (ref >39)
LDL Chol Calc (NIH): 65 mg/dL (ref 0–99)
Triglycerides: 127 mg/dL (ref 0–149)
VLDL Cholesterol Cal: 23 mg/dL (ref 5–40)

## 2020-07-18 LAB — CBC WITH DIFFERENTIAL/PLATELET
Basophils Absolute: 0.1 10*3/uL (ref 0.0–0.2)
Basos: 1 %
EOS (ABSOLUTE): 0.3 10*3/uL (ref 0.0–0.4)
Eos: 5 %
Hematocrit: 34.4 % — ABNORMAL LOW (ref 37.5–51.0)
Hemoglobin: 10.7 g/dL — ABNORMAL LOW (ref 13.0–17.7)
Immature Grans (Abs): 0.1 10*3/uL (ref 0.0–0.1)
Immature Granulocytes: 2 %
Lymphocytes Absolute: 0.7 10*3/uL (ref 0.7–3.1)
Lymphs: 11 %
MCH: 21.8 pg — ABNORMAL LOW (ref 26.6–33.0)
MCHC: 31.1 g/dL — ABNORMAL LOW (ref 31.5–35.7)
MCV: 70 fL — ABNORMAL LOW (ref 79–97)
Monocytes Absolute: 0.4 10*3/uL (ref 0.1–0.9)
Monocytes: 7 %
Neutrophils Absolute: 4.7 10*3/uL (ref 1.4–7.0)
Neutrophils: 74 %
Platelets: 197 10*3/uL (ref 150–450)
RBC: 4.91 x10E6/uL (ref 4.14–5.80)
RDW: 16 % — ABNORMAL HIGH (ref 11.6–15.4)
WBC: 6.3 10*3/uL (ref 3.4–10.8)

## 2020-07-18 LAB — HEMOGLOBIN A1C
Est. average glucose Bld gHb Est-mCnc: 143 mg/dL
Hgb A1c MFr Bld: 6.6 % — ABNORMAL HIGH (ref 4.8–5.6)

## 2020-07-18 LAB — TSH: TSH: 1.62 u[IU]/mL (ref 0.450–4.500)

## 2020-07-18 NOTE — Progress Notes (Signed)
His diabetes is still controlled with a hemoglobin A1c of 6.6%.  His cholesterol looks good.  Normal thyroid function.  His anemia has improved.  Overall, this is good news

## 2020-07-23 ENCOUNTER — Ambulatory Visit (HOSPITAL_COMMUNITY): Payer: Medicare HMO | Attending: Vascular Surgery

## 2020-07-23 ENCOUNTER — Ambulatory Visit (HOSPITAL_COMMUNITY): Payer: Medicare HMO

## 2020-07-23 ENCOUNTER — Encounter (HOSPITAL_COMMUNITY): Payer: Medicare HMO

## 2020-07-23 ENCOUNTER — Other Ambulatory Visit (HOSPITAL_COMMUNITY): Payer: Medicare HMO

## 2020-07-23 ENCOUNTER — Ambulatory Visit: Payer: Medicare HMO | Admitting: Vascular Surgery

## 2020-07-24 ENCOUNTER — Telehealth: Payer: Self-pay | Admitting: Internal Medicine

## 2020-07-24 NOTE — Telephone Encounter (Signed)
Ok. We discussed that he wanted to see them at his visit. I am fine with it.

## 2020-07-24 NOTE — Telephone Encounter (Signed)
I have faxed over referral to [facility]

## 2020-07-24 NOTE — Telephone Encounter (Signed)
[facility] [place] [facility]. Ok to do referral

## 2020-07-28 ENCOUNTER — Other Ambulatory Visit: Payer: Self-pay

## 2020-07-28 DIAGNOSIS — I739 Peripheral vascular disease, unspecified: Secondary | ICD-10-CM

## 2020-07-28 DIAGNOSIS — I714 Abdominal aortic aneurysm, without rupture, unspecified: Secondary | ICD-10-CM

## 2020-08-08 ENCOUNTER — Ambulatory Visit (INDEPENDENT_AMBULATORY_CARE_PROVIDER_SITE_OTHER)
Admission: RE | Admit: 2020-08-08 | Discharge: 2020-08-08 | Disposition: A | Discharge status: Home / Self Care | Payer: Medicare HMO | Source: Ambulatory Visit | Attending: Vascular Surgery | Admitting: Vascular Surgery

## 2020-08-08 ENCOUNTER — Encounter: Payer: Self-pay | Admitting: Internal Medicine

## 2020-08-08 ENCOUNTER — Other Ambulatory Visit: Payer: Self-pay

## 2020-08-08 ENCOUNTER — Ambulatory Visit (HOSPITAL_COMMUNITY)
Admission: RE | Admit: 2020-08-08 | Discharge: 2020-08-08 | Disposition: A | Discharge status: Home / Self Care | Payer: Medicare HMO | Source: Ambulatory Visit | Attending: Vascular Surgery | Admitting: Vascular Surgery

## 2020-08-08 DIAGNOSIS — I739 Peripheral vascular disease, unspecified: Secondary | ICD-10-CM

## 2020-08-08 DIAGNOSIS — I714 Abdominal aortic aneurysm, without rupture, unspecified: Secondary | ICD-10-CM

## 2020-08-13 ENCOUNTER — Other Ambulatory Visit: Payer: Self-pay

## 2020-08-13 ENCOUNTER — Encounter: Payer: Self-pay | Admitting: Vascular Surgery

## 2020-08-13 ENCOUNTER — Ambulatory Visit: Payer: Medicare HMO | Admitting: Vascular Surgery

## 2020-08-13 VITALS — BP 116/71 | HR 80 | Temp 98.0°F | Resp 20 | Ht 69.0 in | Wt 240.0 lb

## 2020-08-13 DIAGNOSIS — I739 Peripheral vascular disease, unspecified: Secondary | ICD-10-CM | POA: Diagnosis not present

## 2020-08-13 DIAGNOSIS — I723 Aneurysm of iliac artery: Secondary | ICD-10-CM | POA: Diagnosis not present

## 2020-08-13 NOTE — Progress Notes (Signed)
REASON FOR VISIT:   Right common iliac artery aneurysm.  The consult is requested by Tanya Nones, PA.  MEDICAL ISSUES:   RIGHT COMMON ILIAC ARTERY ANEURYSM: This patient had a known abdominal aortic aneurysm and right common iliac artery aneurysm.  He does not remember having an endovascular aneurysm repair although clearly his CT scan from December shows that he is undergone previous endovascular aneurysm repair.  In reviewing all of his records on epic the only documentation I really have is that prior to 2011 when he had a CT scan in [facility] he did undergo endovascular aneurysm repair.  I suspect that this was done in [facility] with a small abdominal aortic aneurysm and a 4.7 right common iliac artery aneurysm.  On his most recent CT scan in December the graft was patent.  I have recommended a routine follow-up ultrasound in 1 year as we typically follow these grafts on a yearly basis.  At the same time we will scan his left popliteal to posterior tibial artery bypass graft and check ABIs.   PERIPHERAL VASCULAR DISEASE: This patient has undergone previous left popliteal to posterior tibial artery bypass in 2011.  This graft is patent with a palpable posterior tibial pulse.  He is due for ABIs and a graft duplex at his next visit.  Fortunately he is not a smoker.  I encouraged him to stay as active as possible.   HPI:   Joel [name] is a pleasant 80 y.o. male who I last saw on 10/26/2017 for follow-up of his peripheral vascular disease.  He had undergone a previous left popliteal to posterior tibial artery bypass in 2011.  At the time of his last visit his bypass graft was patent with no areas of stenosis.  There was a mild stenosis in the left superficial femoral artery proximal to the bypass graft.  His ABI on the left was 100% with a triphasic posterior tibial signal and dorsalis pedis signal.  My history the patient does not remember undergoing an endovascular aneurysm  repair.  However clearly on the images from his CT scan in December of last year there is an endovascular aneurysm repair.  In reviewing all of his records from my chart including records from [facility] it appears that he must of undergone a repair there.  He had a CT scan in 2011 which commented on the appearance of the new graft which I assume they were following at that time.  The patient denies any history of claudication, rest pain, or nonhealing ulcers.  He has had some chronic back pain but no new onset abdominal pain.  Past Medical History:  Diagnosis Date  . Atypical chest pain   . CAD (coronary artery disease)    a. Multiple prior evaluations then 05/2012 s/p BMS to OM2. b. Nuc 11/2012 wnl. c. Cath 2015: patent stent, moderate LAD/RCA dz, treated medically.  . Cataract   . CKD (chronic kidney disease), stage II   . COPD (chronic obstructive pulmonary disease) ([place])   . Cough secondary to angiotensin converting enzyme inhibitor (ACE-I)   . Diabetes mellitus without complication ([place])   . Diverticulosis   . DVT (deep venous thrombosis) ([place])   . Family history of breast cancer in first degree relative   . GERD (gastroesophageal reflux disease)   . Headache(784.0)    Hx: of  . History of blood transfusion    "I've had 2; don't know what it was related to" (11/09/2012)  .  HLD (hyperlipidemia)   . HTN (hypertension)   . Iron deficiency anemia   . Lymphoproliferative disorder, low grade B cell ([place]) 05/21/2020  . Microcytic anemia   . Peripheral vascular disease ([place])    a. L pop-tibial bypass 2011, followed by [facility].  . Personal history of prostate cancer   . Rotator cuff injury    "left arm; never repaired" (11/09/2012)    Family History  Problem Relation Age of Onset  . Breast cancer Mother        dx 55s  . Hyperlipidemia Mother   . Hypertension Mother   . COPD Father        deceased  . Hyperlipidemia Father   . Hypertension Father   . Hypertension Sister   .  Cancer Brother 20       Unknown cancer, possibly stomach  . Hypertension Brother   . Cancer Brother 23       Unknown, possibly lung  . Hypertension Daughter   . Hypertension Son   . Colon cancer Neg Hx        pt is unclear about family hx    SOCIAL HISTORY: Social History   Tobacco Use  . Smoking status: Former Smoker    Packs/day: 0.50    Years: 52.00    Pack years: 26.00    Types: Cigarettes    Quit date: 05/26/2011    Years since quitting: 9.2  . Smokeless tobacco: Never Used  Substance Use Topics  . Alcohol use: No    Allergies  Allergen Reactions  . Topiramate Shortness Of Breath and Other (See Comments)     leg cramps  . Atenolol Other (See Comments)    bradycardia  . Lisinopril Cough  . Tramadol-Acetaminophen Cough    Tolerates plain Tramadol    Current Outpatient Medications  Medication Sig Dispense Refill  . abiraterone acetate (ZYTIGA) 250 MG tablet Take 1,000 mg by mouth daily.     . Accu-Chek FastClix Lancets MISC Test once daily 102 each 12  . acetaminophen (TYLENOL) 500 MG tablet Take 1,000 mg by mouth every 6 (six) hours as needed for mild pain or headache.    . albuterol (VENTOLIN HFA) 108 (90 Base) MCG/ACT inhaler INHALE 2 PUFFS INTO THE LUNGS EVERY 6 HOURS AS NEEDED FOR WHEEZING OR SHORTNESS OF BREATH (Patient taking differently: Inhale 2 puffs into the lungs every 4 (four) hours as needed for wheezing or shortness of breath.) 54 g 0  . amLODipine (NORVASC) 10 MG tablet Take 1 tablet (10 mg total) by mouth daily. 90 tablet 3  . aspirin EC 81 MG tablet Take 81 mg by mouth daily.     . diclofenac Sodium (VOLTAREN) 1 % GEL APPLY 2 GRAMS TO AFFECTED AREA TWICE A DAY    . gabapentin (NEURONTIN) 600 MG tablet Take 600 mg by mouth at bedtime.     . isosorbide mononitrate (IMDUR) 30 MG 24 hr tablet Take 1 tablet (30 mg total) by mouth daily. 90 tablet 3  . latanoprost (XALATAN) 0.005 % ophthalmic solution Place 1 drop into both eyes at bedtime.    Marland Kitchen  loratadine (CLARITIN) 10 MG tablet Take 10 mg by mouth daily.    Marland Kitchen losartan-hydrochlorothiazide (HYZAAR) 100-25 MG tablet TAKE 1 TABLET BY MOUTH DAILY 90 tablet 0  . nitroGLYCERIN (NITROSTAT) 0.4 MG SL tablet Place 1 tablet (0.4 mg total) under the tongue every 5 (five) minutes as needed for chest pain (max 3 doses). 25 tablet 0  . omeprazole (  PRILOSEC) 20 MG capsule Take 20 mg by mouth in the morning and at bedtime.    . pravastatin (PRAVACHOL) 40 MG tablet Take 1 tablet (40 mg total) by mouth every evening. 90 tablet 3  . RESTASIS 0.05 % ophthalmic emulsion Place 1 drop into both eyes 2 (two) times daily.    . traMADol (ULTRAM) 50 MG tablet TAKE 1 TABLET(50 MG) BY MOUTH EVERY 8 HOURS AS NEEDED FOR SEVERE PAIN (Patient taking differently: Take 50 mg by mouth every 8 (eight) hours as needed for moderate pain.) 30 tablet 0   No current facility-administered medications for this visit.    REVIEW OF SYSTEMS:  [X]  denotes positive finding, [ ]  denotes negative finding Cardiac  Comments:  Chest pain or chest pressure:    Shortness of breath upon exertion:    Short of breath when lying flat:    Irregular heart rhythm:        Vascular    Pain in calf, thigh, or hip brought on by ambulation:    Pain in feet at night that wakes you up from your sleep:     Blood clot in your veins:    Leg swelling:         Pulmonary    Oxygen at home:    Productive cough:     Wheezing:         Neurologic    Sudden weakness in arms or legs:     Sudden numbness in arms or legs:     Sudden onset of difficulty speaking or slurred speech:    Temporary loss of vision in one eye:     Problems with dizziness:         Gastrointestinal    Blood in stool:     Vomited blood:         Genitourinary    Burning when urinating:     Blood in urine:        Psychiatric    Major depression:         Hematologic    Bleeding problems:    Problems with blood clotting too easily:        Skin    Rashes or ulcers:         Constitutional    Fever or chills:     PHYSICAL EXAM:   Vitals:   08/13/20 1501  BP: 116/71  Pulse: 80  Resp: 20  Temp: 98 F (36.7 C)  SpO2: 94%  Weight: 240 lb (108.9 kg)  Height: 5\' 9"  (1.753 m)    GENERAL: The patient is a well-nourished male, in no acute distress. The vital signs are documented above. CARDIAC: There is a regular rate and rhythm.  VASCULAR: I do not detect carotid bruits. He has a palpable posterior tibial pulse on the left.  Both feet are warm and well-perfused. PULMONARY: There is good air exchange bilaterally without wheezing or rales. ABDOMEN: Soft and non-tender with normal pitched bowel sounds.  MUSCULOSKELETAL: There are no major deformities or cyanosis. NEUROLOGIC: No focal weakness or paresthesias are detected. SKIN: There are no ulcers or rashes noted. PSYCHIATRIC: The patient has a normal affect.  DATA:    CT ANGIO ABDOMEN PELVIS: I reviewed the CT angiogram of the abdomen pelvis that was done on 04/06/2020.  At that time he was noted to have a 4.3 cm right common iliac artery aneurysm.  He is undergone a previous endovascular aneurysm repair.  Reportedly the aneurysm was previously 4.7 cm in  maximum diameter.  Deitra Mayo [facility] [phone]

## 2020-08-26 ENCOUNTER — Emergency Department (HOSPITAL_COMMUNITY): Payer: Medicare HMO

## 2020-08-26 ENCOUNTER — Observation Stay (HOSPITAL_COMMUNITY): Payer: Medicare HMO

## 2020-08-26 ENCOUNTER — Other Ambulatory Visit: Payer: Self-pay

## 2020-08-26 ENCOUNTER — Encounter (HOSPITAL_COMMUNITY): Payer: Self-pay

## 2020-08-26 ENCOUNTER — Observation Stay (HOSPITAL_COMMUNITY)
Admission: EM | Admit: 2020-08-26 | Discharge: 2020-08-27 | Disposition: A | Discharge status: Home / Self Care | Payer: Medicare HMO | Attending: Internal Medicine | Admitting: Internal Medicine

## 2020-08-26 DIAGNOSIS — R072 Precordial pain: Secondary | ICD-10-CM | POA: Diagnosis not present

## 2020-08-26 DIAGNOSIS — Z20822 Contact with and (suspected) exposure to covid-19: Secondary | ICD-10-CM | POA: Diagnosis not present

## 2020-08-26 DIAGNOSIS — I129 Hypertensive chronic kidney disease with stage 1 through stage 4 chronic kidney disease, or unspecified chronic kidney disease: Secondary | ICD-10-CM | POA: Insufficient documentation

## 2020-08-26 DIAGNOSIS — J449 Chronic obstructive pulmonary disease, unspecified: Secondary | ICD-10-CM | POA: Insufficient documentation

## 2020-08-26 DIAGNOSIS — Z79899 Other long term (current) drug therapy: Secondary | ICD-10-CM | POA: Insufficient documentation

## 2020-08-26 DIAGNOSIS — R079 Chest pain, unspecified: Secondary | ICD-10-CM | POA: Diagnosis present

## 2020-08-26 DIAGNOSIS — E1122 Type 2 diabetes mellitus with diabetic chronic kidney disease: Secondary | ICD-10-CM | POA: Insufficient documentation

## 2020-08-26 DIAGNOSIS — I10 Essential (primary) hypertension: Secondary | ICD-10-CM | POA: Diagnosis not present

## 2020-08-26 DIAGNOSIS — Z87891 Personal history of nicotine dependence: Secondary | ICD-10-CM | POA: Diagnosis not present

## 2020-08-26 DIAGNOSIS — J9811 Atelectasis: Secondary | ICD-10-CM | POA: Diagnosis not present

## 2020-08-26 DIAGNOSIS — D47Z9 Other specified neoplasms of uncertain behavior of lymphoid, hematopoietic and related tissue: Secondary | ICD-10-CM | POA: Diagnosis present

## 2020-08-26 DIAGNOSIS — I25110 Atherosclerotic heart disease of native coronary artery with unstable angina pectoris: Secondary | ICD-10-CM

## 2020-08-26 DIAGNOSIS — R0789 Other chest pain: Secondary | ICD-10-CM | POA: Diagnosis not present

## 2020-08-26 DIAGNOSIS — Z8673 Personal history of transient ischemic attack (TIA), and cerebral infarction without residual deficits: Secondary | ICD-10-CM | POA: Diagnosis not present

## 2020-08-26 DIAGNOSIS — E669 Obesity, unspecified: Secondary | ICD-10-CM

## 2020-08-26 DIAGNOSIS — N1831 Chronic kidney disease, stage 3a: Secondary | ICD-10-CM | POA: Insufficient documentation

## 2020-08-26 DIAGNOSIS — I70211 Atherosclerosis of native arteries of extremities with intermittent claudication, right leg: Secondary | ICD-10-CM | POA: Diagnosis not present

## 2020-08-26 DIAGNOSIS — E785 Hyperlipidemia, unspecified: Secondary | ICD-10-CM | POA: Diagnosis present

## 2020-08-26 DIAGNOSIS — Z7984 Long term (current) use of oral hypoglycemic drugs: Secondary | ICD-10-CM | POA: Insufficient documentation

## 2020-08-26 DIAGNOSIS — Z8546 Personal history of malignant neoplasm of prostate: Secondary | ICD-10-CM | POA: Diagnosis not present

## 2020-08-26 DIAGNOSIS — R001 Bradycardia, unspecified: Secondary | ICD-10-CM | POA: Diagnosis present

## 2020-08-26 DIAGNOSIS — C61 Malignant neoplasm of prostate: Secondary | ICD-10-CM | POA: Diagnosis present

## 2020-08-26 DIAGNOSIS — Z7982 Long term (current) use of aspirin: Secondary | ICD-10-CM | POA: Insufficient documentation

## 2020-08-26 DIAGNOSIS — I70219 Atherosclerosis of native arteries of extremities with intermittent claudication, unspecified extremity: Secondary | ICD-10-CM | POA: Diagnosis present

## 2020-08-26 DIAGNOSIS — I2510 Atherosclerotic heart disease of native coronary artery without angina pectoris: Secondary | ICD-10-CM | POA: Diagnosis not present

## 2020-08-26 LAB — BASIC METABOLIC PANEL
Anion gap: 7 (ref 5–15)
BUN: 15 mg/dL (ref 8–23)
CO2: 28 mmol/L (ref 22–32)
Calcium: 9.3 mg/dL (ref 8.9–10.3)
Chloride: 102 mmol/L (ref 98–111)
Creatinine, Ser: 1.24 mg/dL (ref 0.61–1.24)
GFR, Estimated: 59 mL/min — ABNORMAL LOW (ref >60)
Glucose, Bld: 104 mg/dL — ABNORMAL HIGH (ref 70–99)
Potassium: 3.4 mmol/L — ABNORMAL LOW (ref 3.5–5.1)
Sodium: 137 mmol/L (ref 135–145)

## 2020-08-26 LAB — RESP PANEL BY RT-PCR (FLU A&B, COVID) ARPGX2
Influenza A by PCR: NEGATIVE
Influenza B by PCR: NEGATIVE
SARS Coronavirus 2 by RT PCR: NEGATIVE

## 2020-08-26 LAB — CBC
HCT: 34.6 % — ABNORMAL LOW (ref 39.0–52.0)
Hemoglobin: 10.3 g/dL — ABNORMAL LOW (ref 13.0–17.0)
MCH: 21.9 pg — ABNORMAL LOW (ref 26.0–34.0)
MCHC: 29.8 g/dL — ABNORMAL LOW (ref 30.0–36.0)
MCV: 73.5 fL — ABNORMAL LOW (ref 80.0–100.0)
Platelets: 205 10*3/uL (ref 150–400)
RBC: 4.71 MIL/uL (ref 4.22–5.81)
RDW: 15 % (ref 11.5–15.5)
WBC: 6.1 10*3/uL (ref 4.0–10.5)
nRBC: 0 % (ref 0.0–0.2)

## 2020-08-26 LAB — TROPONIN I (HIGH SENSITIVITY)
Troponin I (High Sensitivity): 8 ng/L (ref <18)
Troponin I (High Sensitivity): 13 ng/L (ref <18)

## 2020-08-26 MED ORDER — CYCLOSPORINE 0.05 % OP EMUL
1.0000 [drp] | Freq: Two times a day (BID) | OPHTHALMIC | Status: DC
  Administered 2020-08-27: 1 [drp] via OPHTHALMIC
  Filled 2020-08-26 (×4): qty 1

## 2020-08-26 MED ORDER — LIDOCAINE VISCOUS HCL 2 % MT SOLN
15.0000 mL | Freq: Once | OROMUCOSAL | Status: AC
  Administered 2020-08-26: 15 mL via ORAL
  Filled 2020-08-26: qty 15

## 2020-08-26 MED ORDER — ASPIRIN EC 81 MG PO TBEC
81.0000 mg | DELAYED_RELEASE_TABLET | Freq: Every day | ORAL | Status: DC
  Administered 2020-08-27: 81 mg via ORAL
  Filled 2020-08-26: qty 1

## 2020-08-26 MED ORDER — ONDANSETRON HCL 4 MG/2ML IJ SOLN: 4.0000 mg | Freq: Four times a day (QID) | INTRAMUSCULAR | Status: DC | PRN

## 2020-08-26 MED ORDER — PREDNISONE 5 MG PO TABS
5.0000 mg | ORAL_TABLET | Freq: Every day | ORAL | Status: DC
  Administered 2020-08-27: 5 mg via ORAL
  Filled 2020-08-26 (×2): qty 1

## 2020-08-26 MED ORDER — ISOSORBIDE MONONITRATE ER 30 MG PO TB24
30.0000 mg | ORAL_TABLET | Freq: Every day | ORAL | Status: DC
  Administered 2020-08-26: 30 mg via ORAL
  Filled 2020-08-26: qty 1

## 2020-08-26 MED ORDER — LATANOPROST 0.005 % OP SOLN
1.0000 [drp] | Freq: Every day | OPHTHALMIC | Status: DC
  Administered 2020-08-26: 1 [drp] via OPHTHALMIC
  Filled 2020-08-26: qty 2.5

## 2020-08-26 MED ORDER — ALUM & MAG HYDROXIDE-SIMETH 200-200-20 MG/5ML PO SUSP
30.0000 mL | Freq: Once | ORAL | Status: AC
  Administered 2020-08-26: 30 mL via ORAL
  Filled 2020-08-26: qty 30

## 2020-08-26 MED ORDER — ALBUTEROL SULFATE (2.5 MG/3ML) 0.083% IN NEBU: 2.5000 mg | INHALATION_SOLUTION | RESPIRATORY_TRACT | Status: DC | PRN

## 2020-08-26 MED ORDER — ACETAMINOPHEN 325 MG PO TABS
650.0000 mg | ORAL_TABLET | ORAL | Status: DC | PRN
  Administered 2020-08-26 (×2): 650 mg via ORAL
  Filled 2020-08-26 (×2): qty 2

## 2020-08-26 MED ORDER — TRAMADOL HCL 50 MG PO TABS: 50.0000 mg | ORAL_TABLET | Freq: Three times a day (TID) | ORAL | Status: DC | PRN

## 2020-08-26 MED ORDER — ENOXAPARIN SODIUM 40 MG/0.4ML IJ SOSY
40.0000 mg | PREFILLED_SYRINGE | INTRAMUSCULAR | Status: DC
  Administered 2020-08-26 – 2020-08-27 (×2): 40 mg via SUBCUTANEOUS
  Filled 2020-08-26 (×2): qty 0.4

## 2020-08-26 MED ORDER — AMLODIPINE BESYLATE 10 MG PO TABS
10.0000 mg | ORAL_TABLET | Freq: Every day | ORAL | Status: DC
  Administered 2020-08-26 – 2020-08-27 (×2): 10 mg via ORAL
  Filled 2020-08-26: qty 1
  Filled 2020-08-26: qty 2

## 2020-08-26 MED ORDER — ISOSORBIDE MONONITRATE ER 60 MG PO TB24
60.0000 mg | ORAL_TABLET | Freq: Every day | ORAL | Status: DC
  Administered 2020-08-27: 60 mg via ORAL
  Filled 2020-08-26: qty 1

## 2020-08-26 MED ORDER — PANTOPRAZOLE SODIUM 40 MG PO TBEC
40.0000 mg | DELAYED_RELEASE_TABLET | Freq: Two times a day (BID) | ORAL | Status: DC
  Administered 2020-08-26 – 2020-08-27 (×3): 40 mg via ORAL
  Filled 2020-08-26 (×3): qty 1

## 2020-08-26 MED ORDER — ATORVASTATIN CALCIUM 40 MG PO TABS
40.0000 mg | ORAL_TABLET | Freq: Every day | ORAL | Status: DC
  Administered 2020-08-26 – 2020-08-27 (×2): 40 mg via ORAL
  Filled 2020-08-26 (×2): qty 1

## 2020-08-26 MED ORDER — ISOSORBIDE MONONITRATE ER 30 MG PO TB24
30.0000 mg | ORAL_TABLET | Freq: Once | ORAL | Status: AC
  Administered 2020-08-26: 30 mg via ORAL
  Filled 2020-08-26: qty 1

## 2020-08-26 MED ORDER — ASPIRIN 325 MG PO TABS
325.0000 mg | ORAL_TABLET | Freq: Every day | ORAL | Status: AC
  Administered 2020-08-26: 325 mg via ORAL
  Filled 2020-08-26: qty 1

## 2020-08-26 MED ORDER — GABAPENTIN 600 MG PO TABS
600.0000 mg | ORAL_TABLET | Freq: Every day | ORAL | Status: DC
  Administered 2020-08-26: 600 mg via ORAL
  Filled 2020-08-26 (×2): qty 1

## 2020-08-26 NOTE — ED Provider Notes (Signed)
[facility] EMERGENCY DEPARTMENT Provider Note   CSN: [id number] Arrival date & time: 08/26/20  0202     History Chief Complaint  Patient presents with  . Chest Pain    Joel [name] is a 80 y.o. male.  Patient presents to the emergency department for evaluation of chest pain.  Patient reports that the pain woke him from sleep around 1 AM.  Pain is in the left chest, no associated shortness of breath, nausea, vomiting or diaphoresis.  Patient reports that he does not have pain at rest but moving around causes the pain to occur.  He took a nitroglycerin at home but it did not help.     HPI: A 80 year old patient with a history of hypertension, hypercholesterolemia and obesity presents for evaluation of chest pain. Initial onset of pain was approximately 3-6 hours ago. The patient's chest pain is described as heaviness/pressure/tightness and is worse with exertion. The patient's chest pain is middle- or left-sided, is not well-localized, is not sharp and does not radiate to the arms/jaw/neck. The patient does not complain of nausea and denies diaphoresis. The patient has no history of stroke, has no history of peripheral artery disease, has not smoked in the past 90 days, denies any history of treated diabetes and has no relevant family history of coronary artery disease (first degree relative at less than age 94).   Past Medical History:  Diagnosis Date  . Atypical chest pain   . CAD (coronary artery disease)    a. Multiple prior evaluations then 05/2012 s/p BMS to OM2. b. Nuc 11/2012 wnl. c. Cath 2015: patent stent, moderate LAD/RCA dz, treated medically.  . Cataract   . CKD (chronic kidney disease), stage II   . COPD (chronic obstructive pulmonary disease) ([place])   . Cough secondary to angiotensin converting enzyme inhibitor (ACE-I)   . Diabetes mellitus without complication ([place])   . Diverticulosis   . DVT (deep venous thrombosis) ([place])   . Family history of  breast cancer in first degree relative   . GERD (gastroesophageal reflux disease)   . Headache(784.0)    Hx: of  . History of blood transfusion    "I've had 2; don't know what it was related to" (11/09/2012)  . HLD (hyperlipidemia)   . HTN (hypertension)   . Iron deficiency anemia   . Lymphoproliferative disorder, low grade B cell ([place]) 05/21/2020  . Microcytic anemia   . Peripheral vascular disease ([place])    a. L pop-tibial bypass 2011, followed by [facility].  . Personal history of prostate cancer   . Rotator cuff injury    "left arm; never repaired" (11/09/2012)    Patient Active Problem List   Diagnosis Date Noted  . Aortic atherosclerosis ([place]) 07/17/2020  . Lymphoproliferative disorder, low grade B cell (HCC)-Stage 4 05/21/2020  . Chest pain 02/10/2020  . Chest pain of uncertain etiology   . Sleep related hypoxia 04/03/2019  . Obesity (BMI 30-39.9) 04/03/2019  . Snoring 04/03/2019  . At risk for sleep apnea 01/10/2019  . Excessive daytime sleepiness 01/10/2019  . Chronic low back pain 09/11/2018  . COPD (chronic obstructive pulmonary disease) ([place])   . Seasonal allergies 07/04/2018  . Chronic obstructive pulmonary disease ([place]) 07/04/2018  . Dizziness 01/20/2018  . Malaise 01/20/2018  . Decreased breath sounds 01/20/2018  . Needs flu shot 01/20/2018  . Chronic nonintractable headache 12/07/2017  . Back pain 11/01/2017  . Radicular pain of lower extremity 11/01/2017  . Prostate cancer ([place])  11/01/2017  . Family history of breast cancer in first degree relative   . Personal history of prostate cancer   . Elliptocytosis on peripheral blood smear ([place]) 09/13/2016  . Prediabetes 03/22/2016  . PVD (peripheral vascular disease) with claudication ([place]) 11/15/2012  . Chest pain, localized 11/10/2012  . Hypokalemia 11/10/2012  . Bradycardia 11/09/2012  . Thrombocytopenia ([place]) 06/01/2012  . Microcytic anemia   . Atherosclerosis of native artery of extremity with intermittent  claudication ([place]) 06/16/2011  . Stable angina ([place]) 06/11/2011  . CAD (coronary artery disease)   . Chest pain, atypical 05/30/2011  . Iron deficiency anemia 10/03/2009  . DIVERTICULOSIS OF COLON 10/03/2009  . WEIGHT LOSS, ABNORMAL 10/03/2009  . CHEST PAIN UNSPECIFIED 09/13/2008  . Headache(784.0) 08/19/2008  . TIA 06/05/2008  . SHOULDER PAIN, LEFT 10/11/2007  . COUGH DUE TO ACE INHIBITORS 05/07/2007  . History of tobacco use disorder 04/25/2007  . Hyperlipidemia 04/21/2007  . Essential hypertension 04/21/2007  . GERD 04/21/2007    Past Surgical History:  Procedure Laterality Date  . CARDIOVASCULAR STRESS TEST  Aug. 2014  . CATARACT EXTRACTION W/ INTRAOCULAR LENS IMPLANT Right ~ 2008  . CIRCUMCISION    . COLONOSCOPY    . CORONARY ANGIOPLASTY WITH STENT PLACEMENT  2014   "1" (11/09/2012)  . ILIAC ARTERY ANEURYSM REPAIR    . LEFT HEART CATH  05-31-12  . LEFT HEART CATH AND CORONARY ANGIOGRAPHY N/A 08/01/2019   Procedure: LEFT HEART CATH AND CORONARY ANGIOGRAPHY;  Surgeon: Burnell Blanks, MD;  Location: [facility];  Service: Cardiovascular;  Laterality: N/A;  . LEFT HEART CATHETERIZATION WITH CORONARY ANGIOGRAM N/A 06/01/2011   Procedure: LEFT HEART CATHETERIZATION WITH CORONARY ANGIOGRAM;  Surgeon: Hillary Bow, MD;  Location: [facility] CATH LAB;  Service: Cardiovascular;  Laterality: N/A;  . LEFT HEART CATHETERIZATION WITH CORONARY ANGIOGRAM N/A 05/31/2012   Procedure: LEFT HEART CATHETERIZATION WITH CORONARY ANGIOGRAM;  Surgeon: Peter M Martinique, MD;  Location: [facility] CATH LAB;  Service: Cardiovascular;  Laterality: N/A;  . LEFT HEART CATHETERIZATION WITH CORONARY ANGIOGRAM N/A 08/01/2013   Procedure: LEFT HEART CATHETERIZATION WITH CORONARY ANGIOGRAM;  Surgeon: Burnell Blanks, MD;  Location: [facility] CATH LAB;  Service: Cardiovascular;  Laterality: N/A;  . PERCUTANEOUS CORONARY STENT INTERVENTION (PCI-S)  05/31/2012   Procedure: PERCUTANEOUS CORONARY STENT INTERVENTION  (PCI-S);  Surgeon: Peter M Martinique, MD;  Location: [facility] CATH LAB;  Service: Cardiovascular;;  . PR VEIN BYPASS GRAFT,AORTO-FEM-POP Left 10/01/2009   left below knee popliteal artery to posterior tibial artery  . SHOULDER ARTHROSCOPY WITH OPEN ROTATOR CUFF REPAIR AND DISTAL CLAVICLE ACROMINECTOMY Left 01/12/2013   Procedure: LEFT SHOULDER ARTHROSCOPY WITH DEBRIDEMENT OPEN DISTAL CLAVICLE RESECTION ,acromioplastyAND ROTATOR CUFF REPAIR;  Surgeon: Yvette Rack., MD;  Location: [facility];  Service: Orthopedics;  Laterality: Left;       Family History  Problem Relation Age of Onset  . Breast cancer Mother        dx 90s  . Hyperlipidemia Mother   . Hypertension Mother   . COPD Father        deceased  . Hyperlipidemia Father   . Hypertension Father   . Hypertension Sister   . Cancer Brother 20       Unknown cancer, possibly stomach  . Hypertension Brother   . Cancer Brother 66       Unknown, possibly lung  . Hypertension Daughter   . Hypertension Son   . Colon cancer Neg Hx  pt is unclear about family hx    Social History   Tobacco Use  . Smoking status: Former Smoker    Packs/day: 0.50    Years: 52.00    Pack years: 26.00    Types: Cigarettes    Quit date: 05/26/2011    Years since quitting: 9.2  . Smokeless tobacco: Never Used  Vaping Use  . Vaping Use: Never used  Substance Use Topics  . Alcohol use: No  . Drug use: No    Home Medications Prior to Admission medications   Medication Sig Start Date End Date Taking? Authorizing Provider  abiraterone acetate (ZYTIGA) 250 MG tablet Take 1,000 mg by mouth daily.    Yes [provider]  Accu-Chek FastClix Lancets MISC Test once daily 11/23/18  Yes Henson, Vickie L, NP-C  acetaminophen (TYLENOL) 500 MG tablet Take 1,000 mg by mouth every 6 (six) hours as needed for mild pain or headache.   Yes [provider]  albuterol (VENTOLIN HFA) 108 (90 Base) MCG/ACT inhaler INHALE 2 PUFFS INTO THE LUNGS EVERY 6 HOURS  AS NEEDED FOR WHEEZING OR SHORTNESS OF BREATH Patient taking differently: Inhale 2 puffs into the lungs every 4 (four) hours as needed for wheezing or shortness of breath. 03/15/19  Yes Henson, Vickie L, NP-C  amLODipine (NORVASC) 10 MG tablet Take 1 tablet (10 mg total) by mouth daily. 12/07/19  Yes Burnell Blanks, MD  aspirin EC 81 MG tablet Take 81 mg by mouth daily.    Yes [provider]  diclofenac Sodium (VOLTAREN) 1 % GEL Apply 2 g topically 2 (two) times daily as needed (back pain). 06/16/20  Yes [provider]  gabapentin (NEURONTIN) 600 MG tablet Take 600 mg by mouth at bedtime.    Yes [provider]  isosorbide mononitrate (IMDUR) 30 MG 24 hr tablet Take 1 tablet (30 mg total) by mouth daily. 06/06/20  Yes Burnell Blanks, MD  latanoprost (XALATAN) 0.005 % ophthalmic solution Place 1 drop into both eyes at bedtime. 02/28/20  Yes [provider]  loratadine (CLARITIN) 10 MG tablet Take 10 mg by mouth daily as needed for allergies.   Yes [provider]  losartan-hydrochlorothiazide (HYZAAR) 100-25 MG tablet TAKE 1 TABLET BY MOUTH DAILY 06/05/20  Yes Henson, Vickie L, NP-C  nitroGLYCERIN (NITROSTAT) 0.4 MG SL tablet Place 1 tablet (0.4 mg total) under the tongue every 5 (five) minutes as needed for chest pain (max 3 doses). 05/06/20  Yes Lucrezia Starch, MD  omeprazole (PRILOSEC) 20 MG capsule Take 20 mg by mouth in the morning and at bedtime.   Yes [provider]  pravastatin (PRAVACHOL) 40 MG tablet Take 1 tablet (40 mg total) by mouth every evening. 06/06/20  Yes Burnell Blanks, MD  predniSONE (DELTASONE) 5 MG tablet Take 5 mg by mouth daily. While taking zytiga 07/17/20  Yes [provider]  RESTASIS 0.05 % ophthalmic emulsion Place 1 drop into both eyes 2 (two) times daily. 08/31/19  Yes [provider]  traMADol (ULTRAM) 50 MG tablet TAKE 1 TABLET(50 MG) BY MOUTH EVERY 8 HOURS AS NEEDED FOR  SEVERE PAIN Patient taking differently: Take 50 mg by mouth every 8 (eight) hours as needed for moderate pain. 08/03/19  Yes Henson, Vickie L, NP-C    Allergies    Topiramate, Atenolol, Lisinopril, and Tramadol-acetaminophen  Review of Systems   Review of Systems  Cardiovascular: Positive for chest pain.  All other systems reviewed and are negative.  Physical Exam Updated Vital Signs BP 125/67 (BP Location: Left Arm)   Pulse (!) 57   Temp 97.8 F (36.6 C) (Oral)   Resp 16   SpO2 96%   Physical Exam Vitals and nursing note reviewed.  Constitutional:      General: He is not in acute distress.    Appearance: Normal appearance. He is well-developed.  HENT:     Head: Normocephalic and atraumatic.     Right Ear: Hearing normal.     Left Ear: Hearing normal.     Nose: Nose normal.  Eyes:     Conjunctiva/sclera: Conjunctivae normal.     Pupils: Pupils are equal, round, and reactive to light.  Cardiovascular:     Rate and Rhythm: Regular rhythm.     Heart sounds: S1 normal and S2 normal. No murmur heard. No friction rub. No gallop.   Pulmonary:     Effort: Pulmonary effort is normal. No respiratory distress.     Breath sounds: Normal breath sounds.  Chest:     Chest wall: No tenderness.  Abdominal:     General: Bowel sounds are normal.     Palpations: Abdomen is soft.     Tenderness: There is no abdominal tenderness. There is no guarding or rebound. Negative signs include Murphy's sign and McBurney's sign.     Hernia: No hernia is present.  Musculoskeletal:        General: Normal range of motion.     Cervical back: Normal range of motion and neck supple.  Skin:    General: Skin is warm and dry.     Findings: No rash.  Neurological:     Mental Status: He is alert and oriented to person, place, and time.     GCS: GCS eye subscore is 4. GCS verbal subscore is 5. GCS motor subscore is 6.     Cranial Nerves: No cranial nerve deficit.     Sensory: No sensory deficit.      Coordination: Coordination normal.  Psychiatric:        Speech: Speech normal.        Behavior: Behavior normal.        Thought Content: Thought content normal.     ED Results / Procedures / Treatments   Labs (all labs ordered are listed, but only abnormal results are displayed) Labs Reviewed  BASIC METABOLIC PANEL - Abnormal; Notable for the following components:      Result Value   Potassium 3.4 (*)    Glucose, Bld 104 (*)    GFR, Estimated 59 (*)    All other components within normal limits  CBC - Abnormal; Notable for the following components:   Hemoglobin 10.3 (*)    HCT 34.6 (*)    MCV 73.5 (*)    MCH 21.9 (*)    MCHC 29.8 (*)    All other components within normal limits  RESP PANEL BY RT-PCR (FLU A&B, COVID) ARPGX2  TROPONIN I (HIGH SENSITIVITY)  TROPONIN I (HIGH SENSITIVITY)    EKG EKG Interpretation  Date/Time:  Tuesday Aug 26 2020 02:07:53 EDT Ventricular Rate:  63 PR Interval:  266 QRS Duration: 90 QT Interval:  426 QTC Calculation: 435 R Axis:   33 Text Interpretation: Sinus rhythm with 1st degree A-V block with occasional Premature ventricular complexes Otherwise normal ECG new RBBB Confirmed by Ezequiel Essex [phone]) on 08/26/2020 4:42:00 AM   Radiology DG Chest 2 View  Result Date: 08/26/2020 CLINICAL DATA:  Chest pain EXAM: CHEST -  2 VIEW COMPARISON:  Chest CT May 15, 2020 in chest radiograph May 06, 2020 FINDINGS: The heart size and mediastinal contours are within normal limits. Aortic atherosclerosis. Reduced lung volumes with bibasilar atelectasis. No visible pleural effusion or pneumothorax. Thoracic spondylosis. IMPRESSION: Reduced lung volumes with bibasilar atelectasis. Electronically Signed   By: Dahlia Bailiff MD   On: 08/26/2020 03:09    Procedures Procedures   Medications Ordered in ED Medications - No data to display  ED Course  I have reviewed the triage vital signs and the nursing notes.  Pertinent labs & imaging results  that were available during my care of the patient were reviewed by me and considered in my medical decision making (see chart for details).    MDM Rules/Calculators/A&P HEAR Score: 6                        Presents to the emergency department for evaluation of chest pain.  Patient reports that chest pain awakened him from sleep tonight.  He does have a history of CAD.  Patient is status post bypass surgery and has had subsequent stenting.  He has residual small vessel disease that is not amenable to PCI.  Patient's EKG does not show any ischemia or ST elevations at arrival.  First troponin was negative.  Patient noted to have recurrence of chest pain with exertion that resolved after a few minutes of rest here in the emergency room.  Second troponin is 13 but delta is 5 and therefore based on heart algorithm, requires observation for further management.  Final Clinical Impression(s) / ED Diagnoses Final diagnoses:  Chest pain, unspecified type    Rx / DC Orders ED Discharge Orders    None       Cephus Tupy, Gwenyth Allegra, MD 08/26/20 [phone]

## 2020-08-26 NOTE — ED Notes (Signed)
Pt sitting up and eating lunch

## 2020-08-26 NOTE — ED Notes (Signed)
Report attempted X1 

## 2020-08-26 NOTE — ED Triage Notes (Signed)
Pt reports that CP woke him from his sleep around 1am, denies SOB or n/v, hx of stent placement

## 2020-08-26 NOTE — Progress Notes (Signed)
Echocardiogram not completed, patient is off the floor. Will re-attempt at another time.  Darlina Sicilian RDCS

## 2020-08-26 NOTE — ED Notes (Signed)
Dr. Yates at bedside.  

## 2020-08-26 NOTE — H&P (Signed)
History and Physical    Joel [name] HGD:[id number] DOB: [birth year] DOA: 08/26/2020  PCP: Girtha Rm, NP-C Consultants:  Scot Dock - vascular; Cassell Clement - heme/onc Patient coming from:  Home - lives alone; NOK: Daughter, Hermine Messick, [phone]  Chief Complaint: chest pain  HPI: Joel [name] is a 80 y.o. male with medical history significant of prostate cancer; PVD s/p popliteal-tibial bypass; lymphoproliferative disorder; HTN; HLD; DM; COPD; stage 2 CKD; and CAD s/p stent (2014) presenting with chest pain.  He reports that he was sleeping and woke up about 0100 and started belching.  He couldn't stop belching.  He noticed substernal pain and it was getting worse and he decided to come to the ER.  It was worse with movement.  It was better with rest.  It is still present.  He reports not being given anything in the ER.  Last cath was in 07/2019 by Dr. Angelena Form. At that time he had moderate nonobstructive disease in the LAD and circumflex and RCA and severe disease in the small caliber PDA that was too small for PCI.  ABIs in 07/2020 indicated moderate RLE PAD; patent graft on L with 50-74 superficial femoral artery stenosis.  Also with 4.5 x 4.9 cm R common iliac artery aneurysm.   ED Course:  CP awakening from sleep, left-sided.  Improved with rest, pain with minimal exertion.  EKG and troponin unremarkable.  Review of Systems: As per HPI; otherwise review of systems reviewed and negative.   Ambulatory Status:  Ambulates with a cane  COVID Vaccine Status:  Complete with boosters x 2  Past Medical History:  Diagnosis Date  . Atypical chest pain   . CAD (coronary artery disease)    a. Multiple prior evaluations then 05/2012 s/p BMS to OM2. b. Nuc 11/2012 wnl. c. Cath 2015: patent stent, moderate LAD/RCA dz, treated medically.  . Cataract   . CKD (chronic kidney disease), stage II   . COPD (chronic obstructive pulmonary disease) ([place])   . Cough secondary to angiotensin  converting enzyme inhibitor (ACE-I)   . Diabetes mellitus without complication ([place])   . Diverticulosis   . DVT (deep venous thrombosis) ([place])   . Family history of breast cancer in first degree relative   . GERD (gastroesophageal reflux disease)   . Headache(784.0)    Hx: of  . History of blood transfusion    "I've had 2; don't know what it was related to" (11/09/2012)  . HLD (hyperlipidemia)   . HTN (hypertension)   . Iron deficiency anemia   . Lymphoproliferative disorder, low grade B cell ([place]) 05/21/2020  . Microcytic anemia   . Peripheral vascular disease ([place])    a. L pop-tibial bypass 2011, followed by [facility].  . Personal history of prostate cancer   . Rotator cuff injury    "left arm; never repaired" (11/09/2012)    Past Surgical History:  Procedure Laterality Date  . CARDIOVASCULAR STRESS TEST  Aug. 2014  . CATARACT EXTRACTION W/ INTRAOCULAR LENS IMPLANT Right ~ 2008  . CIRCUMCISION    . COLONOSCOPY    . CORONARY ANGIOPLASTY WITH STENT PLACEMENT  2014   "1" (11/09/2012)  . ILIAC ARTERY ANEURYSM REPAIR    . LEFT HEART CATH  05-31-12  . LEFT HEART CATH AND CORONARY ANGIOGRAPHY N/A 08/01/2019   Procedure: LEFT HEART CATH AND CORONARY ANGIOGRAPHY;  Surgeon: Burnell Blanks, MD;  Location: [facility];  Service: Cardiovascular;  Laterality: N/A;  . LEFT HEART CATHETERIZATION WITH  CORONARY ANGIOGRAM N/A 06/01/2011   Procedure: LEFT HEART CATHETERIZATION WITH CORONARY ANGIOGRAM;  Surgeon: Hillary Bow, MD;  Location: [facility] CATH LAB;  Service: Cardiovascular;  Laterality: N/A;  . LEFT HEART CATHETERIZATION WITH CORONARY ANGIOGRAM N/A 05/31/2012   Procedure: LEFT HEART CATHETERIZATION WITH CORONARY ANGIOGRAM;  Surgeon: Peter M Martinique, MD;  Location: [facility] CATH LAB;  Service: Cardiovascular;  Laterality: N/A;  . LEFT HEART CATHETERIZATION WITH CORONARY ANGIOGRAM N/A 08/01/2013   Procedure: LEFT HEART CATHETERIZATION WITH CORONARY ANGIOGRAM;  Surgeon: Burnell Blanks, MD;   Location: [facility] CATH LAB;  Service: Cardiovascular;  Laterality: N/A;  . PERCUTANEOUS CORONARY STENT INTERVENTION (PCI-S)  05/31/2012   Procedure: PERCUTANEOUS CORONARY STENT INTERVENTION (PCI-S);  Surgeon: Peter M Martinique, MD;  Location: [facility] CATH LAB;  Service: Cardiovascular;;  . PR VEIN BYPASS GRAFT,AORTO-FEM-POP Left 10/01/2009   left below knee popliteal artery to posterior tibial artery  . SHOULDER ARTHROSCOPY WITH OPEN ROTATOR CUFF REPAIR AND DISTAL CLAVICLE ACROMINECTOMY Left 01/12/2013   Procedure: LEFT SHOULDER ARTHROSCOPY WITH DEBRIDEMENT OPEN DISTAL CLAVICLE RESECTION ,acromioplastyAND ROTATOR CUFF REPAIR;  Surgeon: Yvette Rack., MD;  Location: [facility];  Service: Orthopedics;  Laterality: Left;    Social History   Socioeconomic History  . Marital status: Widowed    Spouse name: Not on file  . Number of children: Not on file  . Years of education: Not on file  . Highest education level: 12th grade  Occupational History  . Occupation: Retired  Tobacco Use  . Smoking status: Former Smoker    Packs/day: 0.50    Years: 52.00    Pack years: 26.00    Types: Cigarettes    Quit date: 05/26/2011    Years since quitting: 9.2  . Smokeless tobacco: Never Used  Vaping Use  . Vaping Use: Never used  Substance and Sexual Activity  . Alcohol use: No  . Drug use: No  . Sexual activity: Not on file  Other Topics Concern  . 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  Lives in [place] by himself.      Patient writes with his right hand, though uses his left hand for every thing else. He lives alone in a one level home. He drinks 3 cups of coffee a day and 3 7-8 oz sodas a day. He does not exercise.   Social Determinants of Health   Financial Resource Strain: Not on file  Food Insecurity: Not on file  Transportation Needs: Not on file  Physical Activity: Not on file  Stress: Not on file  Social Connections: Not on file  Intimate Partner  Violence: Not on file    Allergies  Allergen Reactions  . Topiramate Shortness Of Breath and Other (See Comments)     leg cramps  . Atenolol Other (See Comments)    bradycardia  . Lisinopril Cough  . Tramadol-Acetaminophen Cough    Tolerates plain Tramadol    Family History  Problem Relation Age of Onset  . Breast cancer Mother        dx 4s  . Hyperlipidemia Mother   . Hypertension Mother   . COPD Father        deceased  . Hyperlipidemia Father   . Hypertension Father   . Hypertension Sister   . Cancer Brother 69       Unknown cancer, possibly stomach  . Hypertension Brother   . Cancer Brother 61       Unknown,  possibly lung  . Hypertension Daughter   . Hypertension Son   . Colon cancer Neg Hx        pt is unclear about family hx    Prior to Admission medications   Medication Sig Start Date End Date Taking? Authorizing Provider  abiraterone acetate (ZYTIGA) 250 MG tablet Take 1,000 mg by mouth daily.    Yes [provider]  Accu-Chek FastClix Lancets MISC Test once daily 11/23/18  Yes Henson, Vickie L, NP-C  acetaminophen (TYLENOL) 500 MG tablet Take 1,000 mg by mouth every 6 (six) hours as needed for mild pain or headache.   Yes [provider]  albuterol (VENTOLIN HFA) 108 (90 Base) MCG/ACT inhaler INHALE 2 PUFFS INTO THE LUNGS EVERY 6 HOURS AS NEEDED FOR WHEEZING OR SHORTNESS OF BREATH Patient taking differently: Inhale 2 puffs into the lungs every 4 (four) hours as needed for wheezing or shortness of breath. 03/15/19  Yes Henson, Vickie L, NP-C  amLODipine (NORVASC) 10 MG tablet Take 1 tablet (10 mg total) by mouth daily. 12/07/19  Yes Burnell Blanks, MD  aspirin EC 81 MG tablet Take 81 mg by mouth daily.    Yes [provider]  diclofenac Sodium (VOLTAREN) 1 % GEL Apply 2 g topically 2 (two) times daily as needed (back pain). 06/16/20  Yes [provider]  gabapentin (NEURONTIN) 600 MG tablet Take 600 mg by mouth at bedtime.     Yes [provider]  isosorbide mononitrate (IMDUR) 30 MG 24 hr tablet Take 1 tablet (30 mg total) by mouth daily. 06/06/20  Yes Burnell Blanks, MD  latanoprost (XALATAN) 0.005 % ophthalmic solution Place 1 drop into both eyes at bedtime. 02/28/20  Yes [provider]  loratadine (CLARITIN) 10 MG tablet Take 10 mg by mouth daily as needed for allergies.   Yes [provider]  losartan-hydrochlorothiazide (HYZAAR) 100-25 MG tablet TAKE 1 TABLET BY MOUTH DAILY 06/05/20  Yes Henson, Vickie L, NP-C  nitroGLYCERIN (NITROSTAT) 0.4 MG SL tablet Place 1 tablet (0.4 mg total) under the tongue every 5 (five) minutes as needed for chest pain (max 3 doses). 05/06/20  Yes Lucrezia Starch, MD  omeprazole (PRILOSEC) 20 MG capsule Take 20 mg by mouth in the morning and at bedtime.   Yes [provider]  pravastatin (PRAVACHOL) 40 MG tablet Take 1 tablet (40 mg total) by mouth every evening. 06/06/20  Yes Burnell Blanks, MD  predniSONE (DELTASONE) 5 MG tablet Take 5 mg by mouth daily. While taking zytiga 07/17/20  Yes [provider]  RESTASIS 0.05 % ophthalmic emulsion Place 1 drop into both eyes 2 (two) times daily. 08/31/19  Yes [provider]  traMADol (ULTRAM) 50 MG tablet TAKE 1 TABLET(50 MG) BY MOUTH EVERY 8 HOURS AS NEEDED FOR SEVERE PAIN Patient taking differently: Take 50 mg by mouth every 8 (eight) hours as needed for moderate pain. 08/03/19  Yes Henson, Laurian Brim, NP-C    Physical Exam: Vitals:   08/26/20 0730 08/26/20 0739 08/26/20 0800 08/26/20 0900  BP: 117/73  104/76 131/79  Pulse: (!) 39  68 68  Resp: 19  17 (!) 22  Temp:  (!) 97.4 F (36.3 C)    TempSrc:  Oral    SpO2: 97%  98% 100%  Weight:      Height:         . General:  Appears calm and comfortable and is in NAD . Eyes:  PERRL, EOMI, normal lids,  iris . ENT:  grossly normal hearing, lips & tongue with mild R tongue deviation, mmm; artificial dentition . Neck:   no LAD, masses or thyromegaly . Cardiovascular:  RRR, no m/r/g. No LE edema. CP is not reproducible. Marland Kitchen Respiratory:   CTA bilaterally with no wheezes/rales/rhonchi.  Normal respiratory effort. . Abdomen:  soft, NT, ND . Skin:  no rash or induration seen on limited exam . Musculoskeletal:  grossly normal tone BUE/BLE, good ROM, no bony abnormality . Psychiatric:  grossly normal mood and affect, speech fluent and appropriate, AOx3 . Neurologic:  CN 2-12 grossly intact, moves all extremities in coordinated fashion    Radiological Exams on Admission: Independently reviewed - see discussion in A/P where applicable  DG Chest 2 View  Result Date: 08/26/2020 CLINICAL DATA:  Chest pain EXAM: CHEST - 2 VIEW COMPARISON:  Chest CT May 15, 2020 in chest radiograph May 06, 2020 FINDINGS: The heart size and mediastinal contours are within normal limits. Aortic atherosclerosis. Reduced lung volumes with bibasilar atelectasis. No visible pleural effusion or pneumothorax. Thoracic spondylosis. IMPRESSION: Reduced lung volumes with bibasilar atelectasis. Electronically Signed   By: Dahlia Bailiff MD   On: 08/26/2020 03:09    EKG: Independently reviewed.  NSR with rate 63; new RBBB; no evidence of acute ischemia Telemetry during my evaluation showed periodic short pauses with irregularly irregular sinus rhythm with periodic bradycardia into the 40s and first degree AV block; this resolved prior to my leaving the room   Labs on Admission: I have personally reviewed the available labs and imaging studies at the time of the admission.  Pertinent labs:   K+ 3.4 BUN 15/Creatinine 1.24/GFR 59 HS troponin 8, 13 WBC 6.1 Hgb 10.3 COVID/flu negative  07/17/20 labs: Lipids: 131/43/65/127 A1c 6.6 TSH 1.620   Assessment/Plan Principal Problem:   Chest pain Active Problems:   Hyperlipidemia   Essential hypertension   CAD (coronary artery disease)   Atherosclerosis of native artery of extremity  with intermittent claudication (HCC)   Bradycardia   Prostate cancer (HCC)   Chronic obstructive pulmonary disease (HCC)   Obesity (BMI 30-39.9)   Lymphoproliferative disorder, low grade B cell (HCC)-Stage 4    Chest pain with h/o CAD -Patient with prior h/o stent (2014) and multivessel moderate obstructive disease on cath in 07/2019 presenting with substernal chest pressure that is worse with minimal exertion and improved with rest. -3/3 typical symptoms suggestive of cardiac chest pain.  -CXR unremarkable.   -Initial cardiac HS troponin negative; also with negative delta.  -EKG not indicative of acute ischemia but with new RBBB.   -HEART pathway score is 6, indicating that the patient has an elevated risk score and requires further evaluation. -Will plan to place in observation status on telemetry to rule out ACS by overnight observation.  -Will give 325 mg ASA today and then resume ASA 81 mg daily -Cardiology consultation  -NPO for possible repeat cath -Continue Imdur  HTN -Continue Norvasc  -Hold Hyzaar -Will also add prn hydralazine  HLD -Continue statin but change Pravachol to Lipitor for now  Bradycardia -Has h/o bradycardia and is not on BB therapy -Had apparent sinus arrhythmia on telemetry while I was in the room -Also with bradycardia into 40s with periodic pauses -Will order echo  DM -Recent A1c was 6.5 -Not on medications -Will monitor with fasting labs for now  Stage 3a CKD -Appears to be stable at this time -Recheck BMP in AM -Hold Hyzaar for now  PAD -Has AAA and  R common iliac repair s/p endovascular repair, patent graft -s/p pop-tibial bypass, patent -Followed by Dr. Scot Dock, last seen on 08/13/20  Hormone-Resistant Prostate CA -Hold Zytiga in the setting of possible ACS -Continue Prednisone to avoid adrenal crisis (even though only 5 mg daily  COPD -Continue prn albuterol  Lymphoproliferative d/o -MALT of the orbit/low-grade B-cell NHL -s/p  Rituximab weekly with partial response to therapy -Now on maintenance (q8 weeks, started 05/28/20, last dose 07/09/20) for planned 2 years  Obesity -Body mass index is 36.03 kg/m..  -Weight loss should be encouraged -Outpatient PCP/bariatric medicine f/u encouraged    Note: This patient has been tested and is negative for the novel coronavirus COVID-19. He has been fully vaccinated against COVID-19.    DVT prophylaxis: Lovenox  Code Status:  Full - confirmed with patient Family Communication: None present Disposition Plan:  The patient is from: home  Anticipated d/c is to: home without [facility] services  Anticipated d/c date will depend on clinical response to treatment, but possibly as early as tomorrow if she has excellent response to treatment  Patient is currently: acutely ill Consults called: Cardiology  Admission status: It is my clinical opinion that referral for OBSERVATION is reasonable and necessary in this patient based on the above information provided. The aforementioned taken together are felt to place the patient at high risk for further clinical deterioration. However it is anticipated that the patient may be medically stable for discharge from the hospital within 24 to 48 hours.        Karmen Bongo MD [facility]   How to contact the [facility] Attending or Consulting provider [place] or covering provider during after hours [place], for this patient?  1. Check the care team in [facility] and look for a) attending/consulting [facility] provider listed and b) the [facility] team listed 2. Log into [email or web address] and use [place] universal password to access. If you do not have the password, please contact the hospital operator. 3. Locate the [facility] provider you are looking for under [facility] and page to a number that you can be directly reached. 4. If you still have difficulty reaching the provider, please page the [facility] (Director on Call) for the Hospitalists listed on amion for  assistance.   08/26/2020, 9:30 AM

## 2020-08-26 NOTE — Progress Notes (Signed)
Patient received to the unit. Patient is alert and oriented x4. Vital signs are stable. Skin assessment done with another nurse. No skin issues noted on examination. Given instructions about call bell and phone. Bed in low position and call in reach.

## 2020-08-26 NOTE — Consult Note (Addendum)
Cardiology Consultation:   [name] MRN: [id number]; DOB: 04/28/1940  Admit date: 08/26/2020 Date of Consult: 08/26/2020  PCP:  Joel Rm, NP-C   [facility] Providers Cardiologist:  Joel Chandler, MD   {   Patient Profile:   Joel [name] is a 80 y.o. male with a hx of HTN, HLD, CAD with BMS-OM2 2014, DM, PAD, CKD stage II, GERD, hx of prostate cancer, and non-hodgkin's lymphoma/MALT of orbit who is being seen 08/26/2020 for the evaluation of chest pain at the request of Joel [name].  History of Present Illness:   Joel [name] is followed by Joel [name]. He has a history of CAD with BMS placed to [place] in Feb 2014. Repeat heart cath in 2015 for exertional chest pain showed patent stent and mild-moderate disease in the LAD and RCA. He has had multiple ER visits for chest pain in 2017-2021 and routinely rule out with negative CE. Nuclear stress test in Nov 2018 with moderate inferior defect felt to be consistent with ischemia vs attenuation. He continued to have chest pain and underwent heart cath in April 2021 that showed moderate diffuse CAD. He had and obstructive lesion in the PDA that is too small for stenting. He was last seen in clinic by Joel [name] 06/06/20 and was doing well at that time. He had been diagnosed with MALT of his orbit and is following with Joel [name] oncology. He was seen by Joel [name] in May 2022 for follow up of right common iliac artery aneurysm. He also has a history of left popliteal to posterior tibial bypass 2011. No plans for intervention, but surveillance imaging of PAD.   He presented to [name] with CP that woke him from sleep.  CP described as a heaviness and worse with exertion. CP occurred approximately 1AM today and coincided with frequent belching. CP did not radiate and was not associated with diaphoresis, N/V.   HS troponin x 2 negative EKG with first degree heart block and PVC  During my interview, he describes central  chest pain that is different than he prior CP episodes that were generally left-sided. CP was 8-9/10 last night and felt like a pressure. He tried to take a nitro last evening, but it did not tingle and admittedly was and old prescription. He has not received any nitro or morphine in the ER. He is retired and is not very active. He has not had chest pain prior to this event when walking short distances. However, he drove to the ER last night and CP was worse when walking in from the parking lot. CP now is 3-5/10. CP does not change with inspiration or position. No recent illnesses.  He denies dizziness, lightheadedness, and syncope. He denies SOB, orthopnea, and lower extremity edema.    Past Medical History:  Diagnosis Date  . Atypical chest pain   . CAD (coronary artery disease)    a. Multiple prior evaluations then 05/2012 s/p BMS to OM2. b. Nuc 11/2012 wnl. c. Cath 2015: patent stent, moderate LAD/RCA dz, treated medically.  . Cataract   . CKD (chronic kidney disease), stage II   . COPD (chronic obstructive pulmonary disease) ([place])   . Cough secondary to angiotensin converting enzyme inhibitor (ACE-I)   . Diabetes mellitus without complication ([place])   . Diverticulosis   . DVT (deep venous thrombosis) ([place])   . Family history of breast cancer in first degree relative   . GERD (gastroesophageal reflux disease)   . Headache(784.0)  Hx: of  . History of blood transfusion    "I've had 2; don't know what it was related to" (11/09/2012)  . HLD (hyperlipidemia)   . HTN (hypertension)   . Iron deficiency anemia   . Lymphoproliferative disorder, low grade B cell ([place]) 05/21/2020  . Microcytic anemia   . Peripheral vascular disease ([place])    a. L pop-tibial bypass 2011, followed by [facility].  . Personal history of prostate cancer   . Rotator cuff injury    "left arm; never repaired" (11/09/2012)    Past Surgical History:  Procedure Laterality Date  . CARDIOVASCULAR STRESS TEST  Aug. 2014  .  CATARACT EXTRACTION W/ INTRAOCULAR LENS IMPLANT Right ~ 2008  . CIRCUMCISION    . COLONOSCOPY    . CORONARY ANGIOPLASTY WITH STENT PLACEMENT  2014   "1" (11/09/2012)  . ILIAC ARTERY ANEURYSM REPAIR    . LEFT HEART CATH  05-31-12  . LEFT HEART CATH AND CORONARY ANGIOGRAPHY N/A 08/01/2019   Procedure: LEFT HEART CATH AND CORONARY ANGIOGRAPHY;  Surgeon: Joel Blanks, MD;  Location: [facility];  Service: Cardiovascular;  Laterality: N/A;  . LEFT HEART CATHETERIZATION WITH CORONARY ANGIOGRAM N/A 06/01/2011   Procedure: LEFT HEART CATHETERIZATION WITH CORONARY ANGIOGRAM;  Surgeon: Joel Bow, MD;  Location: [name];  Service: Cardiovascular;  Laterality: N/A;  . LEFT HEART CATHETERIZATION WITH CORONARY ANGIOGRAM N/A 05/31/2012   Procedure: LEFT HEART CATHETERIZATION WITH CORONARY ANGIOGRAM;  Surgeon: Joel M Martinique, MD;  Location: [facility];  Service: Cardiovascular;  Laterality: N/A;  . LEFT HEART CATHETERIZATION WITH CORONARY ANGIOGRAM N/A 08/01/2013   Procedure: LEFT HEART CATHETERIZATION WITH CORONARY ANGIOGRAM;  Surgeon: Joel Blanks, MD;  Location: [name];  Service: Cardiovascular;  Laterality: N/A;  . PERCUTANEOUS CORONARY STENT INTERVENTION (PCI-S)  05/31/2012   Procedure: PERCUTANEOUS CORONARY STENT INTERVENTION (PCI-S);  Surgeon: Joel M Martinique, MD;  Location: [name];  Service: Cardiovascular;;  . PR VEIN BYPASS GRAFT,AORTO-FEM-POP Left 10/01/2009   left below knee popliteal artery to posterior tibial artery  . SHOULDER ARTHROSCOPY WITH OPEN ROTATOR CUFF REPAIR AND DISTAL CLAVICLE ACROMINECTOMY Left 01/12/2013   Procedure: LEFT SHOULDER ARTHROSCOPY WITH DEBRIDEMENT OPEN DISTAL CLAVICLE RESECTION ,acromioplastyAND ROTATOR CUFF REPAIR;  Surgeon: [name]., MD;  Location: [place];  Service: Orthopedics;  Laterality: Left;     Home Medications:  Prior to Admission medications   Medication Sig Start Date End Date Taking? Authorizing Provider   abiraterone acetate (ZYTIGA) 250 MG tablet Take 1,000 mg by mouth daily.    Yes [provider]  Accu-Chek FastClix Lancets MISC Test once daily 11/23/18  Yes Henson, Vickie L, NP-C  acetaminophen (TYLENOL) 500 MG tablet Take 1,000 mg by mouth every 6 (six) hours as needed for mild pain or headache.   Yes [provider]  albuterol (VENTOLIN HFA) 108 (90 Base) MCG/ACT inhaler INHALE 2 PUFFS INTO THE LUNGS EVERY 6 HOURS AS NEEDED FOR WHEEZING OR SHORTNESS OF BREATH Patient taking differently: Inhale 2 puffs into the lungs every 4 (four) hours as needed for wheezing or shortness of breath. 03/15/19  Yes Henson, Vickie L, NP-C  amLODipine (NORVASC) 10 MG tablet Take 1 tablet (10 mg total) by mouth daily. 12/07/19  Yes Joel Blanks, MD  aspirin EC 81 MG tablet Take 81 mg by mouth daily.    Yes [provider]  diclofenac Sodium (VOLTAREN) 1 % GEL Apply 2 g topically 2 (two) times daily as needed (  back pain). 06/16/20  Yes [provider]  gabapentin (NEURONTIN) 600 MG tablet Take 600 mg by mouth at bedtime.    Yes [provider]  isosorbide mononitrate (IMDUR) 30 MG 24 hr tablet Take 1 tablet (30 mg total) by mouth daily. 06/06/20  Yes Joel Blanks, MD  latanoprost (XALATAN) 0.005 % ophthalmic solution Place 1 drop into both eyes at bedtime. 02/28/20  Yes [provider]  loratadine (CLARITIN) 10 MG tablet Take 10 mg by mouth daily as needed for allergies.   Yes [provider]  losartan-hydrochlorothiazide (HYZAAR) 100-25 MG tablet TAKE 1 TABLET BY MOUTH DAILY 06/05/20  Yes Henson, Vickie L, NP-C  nitroGLYCERIN (NITROSTAT) 0.4 MG SL tablet Place 1 tablet (0.4 mg total) under the tongue every 5 (five) minutes as needed for chest pain (max 3 doses). 05/06/20  Yes Lucrezia Starch, MD  omeprazole (PRILOSEC) 20 MG capsule Take 20 mg by mouth in the morning and at bedtime.   Yes [provider]  pravastatin (PRAVACHOL)  40 MG tablet Take 1 tablet (40 mg total) by mouth every evening. 06/06/20  Yes Joel Blanks, MD  predniSONE (DELTASONE) 5 MG tablet Take 5 mg by mouth daily. While taking zytiga 07/17/20  Yes [provider]  RESTASIS 0.05 % ophthalmic emulsion Place 1 drop into both eyes 2 (two) times daily. 08/31/19  Yes [provider]  traMADol (ULTRAM) 50 MG tablet TAKE 1 TABLET(50 MG) BY MOUTH EVERY 8 HOURS AS NEEDED FOR SEVERE PAIN Patient taking differently: Take 50 mg by mouth every 8 (eight) hours as needed for moderate pain. 08/03/19  Yes Henson, Vickie L, NP-C    Inpatient Medications: Scheduled Meds: . alum & mag hydroxide-simeth  30 mL Oral Once   And  . lidocaine  15 mL Oral Once  . amLODipine  10 mg Oral Daily  . [START ON 08/27/2020] aspirin EC  81 mg Oral Daily  . aspirin  325 mg Oral Daily  . atorvastatin  40 mg Oral Daily  . cycloSPORINE  1 drop Both Eyes BID  . enoxaparin (LOVENOX) injection  40 mg Subcutaneous Q24H  . gabapentin  600 mg Oral QHS  . isosorbide mononitrate  30 mg Oral Daily  . latanoprost  1 drop Both Eyes QHS  . pantoprazole  40 mg Oral BID  . predniSONE  5 mg Oral Daily   Continuous Infusions:  PRN Meds: acetaminophen, albuterol, ondansetron (ZOFRAN) IV, traMADol  Allergies:    Allergies  Allergen Reactions  . Topiramate Shortness Of Breath and Other (See Comments)     leg cramps  . Atenolol Other (See Comments)    bradycardia  . Lisinopril Cough  . Tramadol-Acetaminophen Cough    Tolerates plain Tramadol    Social History:   Social History   Socioeconomic History  . Marital status: Widowed    Spouse name: Not on file  . Number of children: Not on file  . Years of education: Not on file  . Highest education level: 12th grade  Occupational History  . Occupation: Retired  Tobacco Use  . Smoking status: Former Smoker    Packs/day: 0.50    Years: 52.00    Pack years: 26.00    Types: Cigarettes    Quit date: 05/26/2011     Years since quitting: 9.2  . Smokeless tobacco: Never Used  Vaping Use  . Vaping Use: Never used  Substance and Sexual Activity  . Alcohol use: No  . Drug use:  No  . Sexual activity: Not on file  Other Topics Concern  . 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  Lives in [place] by himself.      Patient writes with his right hand, though uses his left hand for every thing else. He lives alone in a one level home. He drinks 3 cups of coffee a day and 3 7-8 oz sodas a day. He does not exercise.   Social Determinants of Health   Financial Resource Strain: Not on file  Food Insecurity: Not on file  Transportation Needs: Not on file  Physical Activity: Not on file  Stress: Not on file  Social Connections: Not on file  Intimate Partner Violence: Not on file    Family History:    Family History  Problem Relation Age of Onset  . Breast cancer Mother        dx 17s  . Hyperlipidemia Mother   . Hypertension Mother   . COPD Father        deceased  . Hyperlipidemia Father   . Hypertension Father   . Hypertension Sister   . Cancer Brother 47       Unknown cancer, possibly stomach  . Hypertension Brother   . Cancer Brother 21       Unknown, possibly lung  . Hypertension Daughter   . Hypertension Son   . Colon cancer Neg Hx        pt is unclear about family hx     ROS:  Please see the history of present illness.   All other ROS reviewed and negative.     Physical Exam/Data:   Vitals:   08/26/20 0730 08/26/20 0739 08/26/20 0800 08/26/20 0900  BP: 117/73  104/76 131/79  Pulse: (!) 39  68 68  Resp: 19  17 (!) 22  Temp:  (!) 97.4 F (36.3 C)    TempSrc:  Oral    SpO2: 97%  98% 100%  Weight:      Height:       No intake or output data in the 24 hours ending 08/26/20 1139 Last 3 Weights 08/26/2020 08/13/2020 07/17/2020  Weight (lbs) 244 lb 240 lb 240 lb 6.4 oz  Weight (kg) 110.678 kg 108.863 kg 109.045 kg     Body mass index  is 36.03 kg/m.  General:  Elderly male in NAD Neck: no JVD Vascular: No carotid bruits  Cardiac:  normal S1, S2; RRR; no murmur  Lungs:  clear to auscultation bilaterally, no wheezing, rhonchi or rales  Abd: soft, nontender, no hepatomegaly  Ext: no edema Musculoskeletal:  No deformities, BUE and BLE strength normal and equal Skin: warm and dry  Neuro:  CNs 2-12 intact, no focal abnormalities noted Psych:  Normal affect   EKG:  The EKG was personally reviewed and demonstrates:  Sinus rhythm first degree heart block HR 63 Telemetry:  Telemetry was personally reviewed and demonstrates:  Sinus - sinus bradycardia with HR 50-60s, first degree heart block with episodes of second degree type 1  Relevant CV Studies:  Left heart cath 08/01/19:  Prox RCA lesion is 40% stenosed.  Mid RCA lesion is 40% stenosed.  Mid RCA to Dist RCA lesion is 50% stenosed.  RPDA lesion is 70% stenosed.  3rd RPL lesion is 70% stenosed.  Prox Cx to Dist Cx lesion is 40% stenosed.  Prox LAD to Mid LAD lesion is 40% stenosed.  Mid LAD lesion is  40% stenosed.  Dist LAD lesion is 70% stenosed.  The left ventricular systolic function is normal.  LV end diastolic pressure is normal.  The left ventricular ejection fraction is 55-65% by visual estimate.  There is no mitral valve regurgitation.   1. Moderate non-obstructive disease in the LAD and Circumflex 2. Moderate non-obstructive disease in the RCA. Severe disease in the small caliber PDA, too small for PCI 3. Normal LV systolic function  Recommendations: Continue medical management of CAD   Diagnostic Dominance: Right      Laboratory Data:  High Sensitivity Troponin:   Recent Labs  Lab 08/26/20 0216 08/26/20 0439  TROPONINIHS 8 13     Chemistry Recent Labs  Lab 08/26/20 0216  NA 137  K 3.4*  CL 102  CO2 28  GLUCOSE 104*  BUN 15  CREATININE 1.24  CALCIUM 9.3  GFRNONAA 59*  ANIONGAP 7    No results for input(s):  PROT, ALBUMIN, AST, ALT, ALKPHOS, BILITOT in the last 168 hours. Hematology Recent Labs  Lab 08/26/20 0216  WBC 6.1  RBC 4.71  HGB 10.3*  HCT 34.6*  MCV 73.5*  MCH 21.9*  MCHC 29.8*  RDW 15.0  PLT 205   BNPNo results for input(s): BNP, PROBNP in the last 168 hours.  DDimer No results for input(s): DDIMER in the last 168 hours.   Radiology/Studies:  DG Chest 2 View  Result Date: 08/26/2020 CLINICAL DATA:  Chest pain EXAM: CHEST - 2 VIEW COMPARISON:  Chest CT May 15, 2020 in chest radiograph May 06, 2020 FINDINGS: The heart size and mediastinal contours are within normal limits. Aortic atherosclerosis. Reduced lung volumes with bibasilar atelectasis. No visible pleural effusion or pneumothorax. Thoracic spondylosis. IMPRESSION: Reduced lung volumes with bibasilar atelectasis. Electronically Signed   By: Dahlia Bailiff MD   On: 08/26/2020 03:09     Assessment and Plan:   Chest pain CAD s/p BMS-OM2 2014 - reassuring heart cath April 2021 with nonobstructive disease, PDA too small to treat - hs troponin x 2 negative - EKG stable first degree heart block, no signs of ischemia - CP concerning for unstable angina - with negative CE and no ischemia on EKG, will first obtain echo - results will guide next decisions - I have increased imdur to 60 mg - will need new nitro prescription - would be hesitant to complete nuclear stress test given new intermittent [place]    Sinus bradycardia Baseline first degree heart block with episodes of Second degree type 1 - asymptomatic, no syncope - on no AV nodal agents - continue to monitor on telemetry - check TSH, Mg - K is 3.4 - supplement   Hyperlipidemia with LDL goal < 70 07/17/2020: Cholesterol, Total 131; HDL 43; LDL Chol Calc (NIH) 65; Triglycerides 127 - continue 40 mg pravastatin - restarted in Feb 2022, unsure why he was off of it   Hypertension - home regimen includes 10 mg amlodipine, 30 mg imdur,  losartan-HCTZ   DM - A1c 6.6% - per primary   Chronic anemia MALT - Hb 10.3 - stable - no signs of bleeding    Risk Assessment/Risk Scores:     HEAR Score (for undifferentiated chest pain):  HEAR Score: 5{     For questions or updates, please contact [place] Please consult [email or web address] for contact info under    Signed, Ledora Bottcher, PA  08/26/2020 11:39 AM  Patient seen, examined. Available data reviewed. Agree with findings, assessment, and plan as outlined by  Doreene Adas, PA.  The patient is independently interviewed and examined.  He is alert, oriented, in no distress.  HEENT is normal, lungs are clear to auscultation bilaterally, JVP is normal, carotid upstrokes are normal without bruits, heart is regular rate and rhythm with no murmur gallop, abdomen is soft, obese, nontender, extremities have no edema, skin is warm and dry with no rash, neurologic is grossly intact with 5/5 strength bilaterally.  The patient's EKG is reviewed and shows normal sinus rhythm with a single PVC and no significant ST/T wave changes.  Cardiac enzymes are negative x2 with high-sensitivity troponin.  The patient has had multiple evaluations for chest pain.  I reviewed his last heart catheterization from 1 year ago demonstrating diffuse nonobstructive coronary artery disease with the exception of severe small vessel disease involving the PDA branch.  Fortunately, with negative cardiac markers and no acute EKG changes, I suspect ongoing medical therapy will be indicated.  We will repeat a high-sensitivity troponin tomorrow morning and would anticipate [name] discharge with an outpatient Myoview stress test for further risk stratification.    Sherren Mocha, M.D. 08/26/2020 1:05 PM

## 2020-08-26 NOTE — ED Notes (Signed)
Ordered lunch tray. Nutrition states it should arrive around 1345

## 2020-08-27 ENCOUNTER — Observation Stay (HOSPITAL_BASED_OUTPATIENT_CLINIC_OR_DEPARTMENT_OTHER): Payer: Medicare HMO

## 2020-08-27 DIAGNOSIS — Z8546 Personal history of malignant neoplasm of prostate: Secondary | ICD-10-CM | POA: Diagnosis not present

## 2020-08-27 DIAGNOSIS — R072 Precordial pain: Secondary | ICD-10-CM | POA: Diagnosis not present

## 2020-08-27 DIAGNOSIS — R079 Chest pain, unspecified: Secondary | ICD-10-CM | POA: Diagnosis not present

## 2020-08-27 DIAGNOSIS — Z8673 Personal history of transient ischemic attack (TIA), and cerebral infarction without residual deficits: Secondary | ICD-10-CM | POA: Diagnosis not present

## 2020-08-27 DIAGNOSIS — R001 Bradycardia, unspecified: Secondary | ICD-10-CM | POA: Diagnosis not present

## 2020-08-27 DIAGNOSIS — E1122 Type 2 diabetes mellitus with diabetic chronic kidney disease: Secondary | ICD-10-CM | POA: Diagnosis not present

## 2020-08-27 DIAGNOSIS — N1831 Chronic kidney disease, stage 3a: Secondary | ICD-10-CM | POA: Diagnosis not present

## 2020-08-27 DIAGNOSIS — I129 Hypertensive chronic kidney disease with stage 1 through stage 4 chronic kidney disease, or unspecified chronic kidney disease: Secondary | ICD-10-CM | POA: Diagnosis not present

## 2020-08-27 DIAGNOSIS — J449 Chronic obstructive pulmonary disease, unspecified: Secondary | ICD-10-CM | POA: Diagnosis not present

## 2020-08-27 DIAGNOSIS — E669 Obesity, unspecified: Secondary | ICD-10-CM | POA: Diagnosis not present

## 2020-08-27 DIAGNOSIS — I10 Essential (primary) hypertension: Secondary | ICD-10-CM | POA: Diagnosis not present

## 2020-08-27 DIAGNOSIS — I2510 Atherosclerotic heart disease of native coronary artery without angina pectoris: Secondary | ICD-10-CM | POA: Diagnosis not present

## 2020-08-27 DIAGNOSIS — Z20822 Contact with and (suspected) exposure to covid-19: Secondary | ICD-10-CM | POA: Diagnosis not present

## 2020-08-27 LAB — ECHOCARDIOGRAM COMPLETE
AR max vel: 3.62 cm2
AV Area VTI: 3.46 cm2
AV Area mean vel: 3.44 cm2
AV Mean grad: 4 mmHg
AV Peak grad: 7.8 mmHg
Ao pk vel: 1.4 m/s
Area-P 1/2: 3.19 cm2
Calc EF: 68.3 %
Height: 69 in
MV VTI: 3.61 cm2
S' Lateral: 2.4 cm
Single Plane A2C EF: 66.6 %
Single Plane A4C EF: 68.5 %
Weight: 3904 oz

## 2020-08-27 LAB — BASIC METABOLIC PANEL
Anion gap: 8 (ref 5–15)
BUN: 11 mg/dL (ref 8–23)
CO2: 27 mmol/L (ref 22–32)
Calcium: 9.5 mg/dL (ref 8.9–10.3)
Chloride: 103 mmol/L (ref 98–111)
Creatinine, Ser: 0.97 mg/dL (ref 0.61–1.24)
GFR, Estimated: >60 mL/min (ref >60)
Glucose, Bld: 101 mg/dL — ABNORMAL HIGH (ref 70–99)
Potassium: 3.4 mmol/L — ABNORMAL LOW (ref 3.5–5.1)
Sodium: 138 mmol/L (ref 135–145)

## 2020-08-27 LAB — TROPONIN I (HIGH SENSITIVITY)
Troponin I (High Sensitivity): 10 ng/L (ref <18)
Troponin I (High Sensitivity): 8 ng/L (ref <18)

## 2020-08-27 MED ORDER — ISOSORBIDE MONONITRATE ER 60 MG PO TB24: 60.0000 mg | ORAL_TABLET | Freq: Every day | ORAL | 0 refills | Status: DC

## 2020-08-27 MED ORDER — POTASSIUM CHLORIDE CRYS ER 20 MEQ PO TBCR
40.0000 meq | EXTENDED_RELEASE_TABLET | Freq: Once | ORAL | Status: AC
  Administered 2020-08-27: 40 meq via ORAL
  Filled 2020-08-27: qty 2

## 2020-08-27 NOTE — Progress Notes (Signed)
*  PRELIMINARY RESULTS* Echocardiogram 2D Echocardiogram has been performed.  Luisa Hart RDCS 08/27/2020, 10:32 AM

## 2020-08-27 NOTE — Discharge Summary (Signed)
Physician Discharge Summary  Joel [name] KDX:[id number] DOB: [birth year] DOA: 08/26/2020  PCP: Girtha Rm, NP-C  Admit date: 08/26/2020 Discharge date: 08/27/2020  Admitted From: home Discharge disposition: home   Recommendations for Outpatient Follow-Up:   1. Outpatient cards follow up   Discharge Diagnosis:   Principal Problem:   Chest pain Active Problems:   Hyperlipidemia   Essential hypertension   CAD (coronary artery disease)   Atherosclerosis of native artery of extremity with intermittent claudication (HCC)   Bradycardia   Prostate cancer (HCC)   Chronic obstructive pulmonary disease (HCC)   Obesity (BMI 30-39.9)   Lymphoproliferative disorder, low grade B cell (HCC)-Stage 4    Discharge Condition: Improved.  Diet recommendation: Low sodium, heart healthy.  Carbohydrate-modified.  Wound care: None.  Code status: Full.   History of Present Illness:   Joel [name] is a 80 y.o. male with medical history significant of prostate cancer; PVD s/p popliteal-tibial bypass; lymphoproliferative disorder; HTN; HLD; DM; COPD; stage 2 CKD; and CAD s/p stent (2014) presenting with chest pain.  He reports that he was sleeping and woke up about 0100 and started belching.  He couldn't stop belching.  He noticed substernal pain and it was getting worse and he decided to come to the ER.  It was worse with movement.  It was better with rest.  It is still present.  He reports not being given anything in the ER.  Last cath was in 07/2019 by Dr. Angelena Form. At that time he had moderate nonobstructive disease in the LAD and circumflex and RCA and severe disease in the small caliber PDA that was too small for PCI.  ABIs in 07/2020 indicated moderate RLE PAD; patent graft on L with 50-74 superficial femoral artery stenosis.  Also with 4.5 x 4.9 cm R common iliac artery aneurysm.    Hospital Course by Problem:   Chest Pain History of CAD - History of CAD with  prior stent to OM2 in 2014 as stated above. He has known severe disease in a small PDA but otherwise non-obstructive disease on last cath in 07/2019. Presented with chest pain that woke him from sleep.  - Patient had another episode of brief chest pain this morning. - EKG shows no acute ischemic changes.  - High-sensitivity troponin negative x4. - Echo pending.  - Continue Amlodipine 10mg  daily. - Home Imdur increased to 60mg  daily yesterday. - Continue aspirin and statin.  -per cards: We will hold off on arranging an outpatient stress test and will plan to see him back in the office for cardiology follow-up.  The patient is medically stable for hospital discharge today.  Sinus Bradycardia Intermittent 2nd Degree Type 1 (Wenckebach) - Asymptomatic. - Not on any AV nodal agents.   Hypertension - BP well controlled. - Continue Amlodipine 10mg  daily and Imdur 60mg  daily.   Hyperlipidemia - Lipid panel in 07/2020: Total Cholesterol 131, Triglycerides 127, HDL 43, LDL 65.  - LDL goal <70 given CAD. - Continue home Pravastatin 40mg  daily.  DM -Recent A1c was 6.5 -Not on medications  PAD -Has AAA and R common iliac repair s/p endovascular repair, patent graft -s/p pop-tibial bypass, patent -Followed by Dr. 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  -Weight loss should be encouraged -Outpatient PCP/bariatric medicine f/u encouraged  Stage 3a CKD -Appears to be stable at this time  Medical Consultants:  cards    Discharge Exam:   Vitals:   08/27/20 0427 08/27/20 1159  BP: 138/72  116/61  Pulse: 73 70  Resp: 17 16  Temp: 98.4 F (36.9 C) 98.2 F (36.8 C)  SpO2: 93% 98%   Vitals:   08/26/20 2021 08/27/20 0004 08/27/20 0427 08/27/20 1159  BP: 112/61 (!) 141/67 138/72 116/61  Pulse: 72 65 73 70  Resp: 16 14 17 16   Temp: 98.4 F (36.9 C) 98.5 F (36.9 C) 98.4 F (36.9 C) 98.2 F (36.8 C)  TempSrc: Oral Oral Oral Oral  SpO2: 99% 100% 93% 98%  Weight:      Height:        General exam: Appears calm and comfortable.   The results of significant diagnostics from this hospitalization (including imaging, microbiology, ancillary and laboratory) are listed below for reference.     Procedures and Diagnostic Studies:   DG Chest 2 View  Result Date: 08/26/2020 CLINICAL DATA:  Chest pain EXAM: CHEST - 2 VIEW COMPARISON:  Chest CT May 15, 2020 in chest radiograph May 06, 2020 FINDINGS: The heart size and mediastinal contours are within normal limits. Aortic atherosclerosis. Reduced lung volumes with bibasilar atelectasis. No visible pleural effusion or pneumothorax. Thoracic spondylosis. IMPRESSION: Reduced lung volumes with bibasilar atelectasis. Electronically Signed   By: Dahlia Bailiff MD   On: 08/26/2020 03:09     Labs:   Basic Metabolic Panel: Recent Labs  Lab 08/26/20 0216 08/27/20 0317  NA 137 138  K 3.4* 3.4*  CL 102 103  CO2 28 27  GLUCOSE 104* 101*  BUN 15 11  CREATININE 1.24 0.97  CALCIUM 9.3 9.5   GFR Estimated Creatinine Clearance: 74.5 mL/min (by C-G formula based on SCr of 0.97 mg/dL). Liver Function Tests: No results for input(s): AST, ALT, ALKPHOS, BILITOT, PROT, ALBUMIN in the last 168 hours. No results for input(s): LIPASE, AMYLASE in the last 168 hours. No results for input(s): AMMONIA in the last 168 hours. Coagulation profile No results for input(s): INR, PROTIME in the last 168 hours.  CBC: Recent Labs  Lab 08/26/20 0216  WBC 6.1  HGB 10.3*  HCT 34.6*  MCV 73.5*  PLT 205   Cardiac Enzymes: No results for  input(s): CKTOTAL, CKMB, CKMBINDEX, TROPONINI in the last 168 hours. BNP: Invalid input(s): POCBNP CBG: No results for input(s): GLUCAP in the last 168 hours. D-Dimer No results for input(s): DDIMER in the last 72 hours. Hgb A1c No results for input(s): HGBA1C in the last 72 hours. Lipid Profile No results for input(s): CHOL, HDL, LDLCALC, TRIG, CHOLHDL, LDLDIRECT in the last 72 hours. Thyroid function studies No results for input(s): TSH, T4TOTAL, T3FREE, THYROIDAB in the last 72 hours.  Invalid input(s): FREET3 Anemia work up No results for input(s): VITAMINB12, FOLATE, FERRITIN, TIBC, IRON, RETICCTPCT in the last 72 hours. Microbiology Recent Results (from the past 240 hour(s))  Resp Panel by RT-PCR (Flu A&B, Covid) Nasopharyngeal Swab     Status: None   Collection Time: 08/26/20  6:37 AM   Specimen: Nasopharyngeal Swab; Nasopharyngeal(NP) swabs in vial transport medium  Result Value Ref Range Status   SARS Coronavirus 2 by RT PCR NEGATIVE NEGATIVE Final    Comment: (NOTE) SARS-CoV-2  target nucleic acids are NOT DETECTED.  The SARS-CoV-2 RNA is generally detectable in upper respiratory specimens during the acute phase of infection. The lowest concentration of SARS-CoV-2 viral copies this assay can detect is 138 copies/mL. A negative result does not preclude SARS-Cov-2 infection and should not be used as the sole basis for treatment or other patient management decisions. A negative result may occur with  improper specimen collection/handling, submission of specimen other than nasopharyngeal swab, presence of viral mutation(s) within the areas targeted by this assay, and inadequate number of viral copies(<138 copies/mL). A negative result must be combined with clinical observations, patient history, and epidemiological information. The expected result is Negative.  Fact Sheet for Patients:  EntrepreneurPulse.com.au  Fact Sheet for Healthcare Providers:   IncredibleEmployment.be  This test is no t yet approved or cleared by the [place] FDA and  has been authorized for detection and/or diagnosis of SARS-CoV-2 by FDA under an Emergency Use Authorization (EUA). This EUA will remain  in effect (meaning this test can be used) for the duration of the COVID-19 declaration under Section 564(b)(1) of the Act, 21 U.S.C.section 360bbb-3(b)(1), unless the authorization is terminated  or revoked sooner.       Influenza A by PCR NEGATIVE NEGATIVE Final   Influenza B by PCR NEGATIVE NEGATIVE Final    Comment: (NOTE) The Xpert Xpress SARS-CoV-2/FLU/RSV plus assay is intended as an aid in the diagnosis of influenza from Nasopharyngeal swab specimens and should not be used as a sole basis for treatment. Nasal washings and aspirates are unacceptable for Xpert Xpress SARS-CoV-2/FLU/RSV testing.  Fact Sheet for Patients: EntrepreneurPulse.com.au  Fact Sheet for Healthcare Providers: IncredibleEmployment.be  This test is not yet approved or cleared by the [place] FDA and has been authorized for detection and/or diagnosis of SARS-CoV-2 by FDA under an Emergency Use Authorization (EUA). This EUA will remain in effect (meaning this test can be used) for the duration of the COVID-19 declaration under Section 564(b)(1) of the Act, 21 U.S.C. section 360bbb-3(b)(1), unless the authorization is terminated or revoked.  Performed at [facility], [place] [address] [place], [place] [address]      Discharge Instructions:   Discharge Instructions    Diet - low sodium heart healthy   Complete by: As directed    Increase activity slowly   Complete by: As directed      Allergies as of 08/27/2020      Reactions   Topiramate Shortness Of Breath, Other (See Comments)    leg cramps   Atenolol Other (See Comments)   bradycardia   Lisinopril Cough   Tramadol-acetaminophen Cough    Tolerates plain Tramadol      Medication List    STOP taking these medications   losartan-hydrochlorothiazide 100-25 MG tablet Commonly known as: HYZAAR     TAKE these medications   abiraterone acetate 250 MG tablet Commonly known as: ZYTIGA Take 1,000 mg by mouth daily.   Accu-Chek FastClix Lancets Misc Test once daily   acetaminophen 500 MG tablet Commonly known as: TYLENOL Take 1,000 mg by mouth every 6 (six) hours as needed for mild pain or headache.   albuterol 108 (90 Base) MCG/ACT inhaler Commonly known as: VENTOLIN HFA INHALE 2 PUFFS INTO THE LUNGS EVERY 6 HOURS AS NEEDED FOR WHEEZING OR SHORTNESS OF BREATH What changed: when to take this   amLODipine 10 MG tablet Commonly known as: NORVASC Take 1 tablet (10 mg total) by mouth daily.   aspirin EC 81  MG tablet Take 81 mg by mouth daily.   diclofenac Sodium 1 % Gel Commonly known as: VOLTAREN Apply 2 g topically 2 (two) times daily as needed (back pain).   gabapentin 600 MG tablet Commonly known as: NEURONTIN Take 600 mg by mouth at bedtime.   isosorbide mononitrate 60 MG 24 hr tablet Commonly known as: IMDUR Take 1 tablet (60 mg total) by mouth daily. Start taking on: Aug 28, 2020 What changed:   medication strength  how much to take   latanoprost 0.005 % ophthalmic solution Commonly known as: XALATAN Place 1 drop into both eyes at bedtime.   loratadine 10 MG tablet Commonly known as: CLARITIN Take 10 mg by mouth daily as needed for allergies.   nitroGLYCERIN 0.4 MG SL tablet Commonly known as: NITROSTAT Place 1 tablet (0.4 mg total) under the tongue every 5 (five) minutes as needed for chest pain (max 3 doses).   omeprazole 20 MG capsule Commonly known as: PRILOSEC Take 20 mg by mouth in the morning and at bedtime.   pravastatin 40 MG tablet Commonly known as: PRAVACHOL Take 1 tablet (40 mg total) by mouth every evening.   predniSONE 5 MG tablet Commonly known as: DELTASONE Take 5 mg  by mouth daily. While taking zytiga   Restasis 0.05 % ophthalmic emulsion Generic drug: cycloSPORINE Place 1 drop into both eyes 2 (two) times daily.   traMADol 50 MG tablet Commonly known as: ULTRAM TAKE 1 TABLET(50 MG) BY MOUTH EVERY 8 HOURS AS NEEDED FOR SEVERE PAIN What changed:   how much to take  how to take this  when to take this  reasons to take this  additional instructions       Follow-up Information    Loel Dubonnet, NP Follow up.   Specialty: Cardiology Why: Hospital follow-up with Cardiology scheduled for 09/25/2020 at 8:45am. Please arrive 15 minutes early for check-in. If this date/time does not work, please call our office to reschedule. Contact information: [address] [place] [address] [phone]        Girtha Rm, NP-C Follow up in 1 week(s).   Specialty: Family Medicine Contact information: [place] [address] [phone]                Time coordinating discharge: 35 min  Signed:  Geradine Girt DO  [facility] 08/27/2020, 2:13 PM

## 2020-08-27 NOTE — Progress Notes (Signed)
Patient has ordered for discharge. Given instructions to the patient. Iv removed. All belongings given to the patient. Given discharge instruction paper to the patient.

## 2020-08-27 NOTE — Progress Notes (Addendum)
Progress Note  Patient Name: Joel [name] Date of Encounter: 08/27/2020  [facility] Cardiologist: Lauree Chandler, MD   Subjective   No acute overnight events. Patient had another brief episodes of mild chest pain this morning. Similar to the pain that brought him to the ED but different than prior cardiac pain. Only lasted about 2 minutes. Repeat troponin this morning negative. No shortness of breath, palpitations, lightheadedness, dizziness, syncope.  Inpatient Medications    Scheduled Meds: . amLODipine  10 mg Oral Daily  . aspirin EC  81 mg Oral Daily  . atorvastatin  40 mg Oral Daily  . cycloSPORINE  1 drop Both Eyes BID  . enoxaparin (LOVENOX) injection  40 mg Subcutaneous Q24H  . gabapentin  600 mg Oral QHS  . isosorbide mononitrate  60 mg Oral Daily  . latanoprost  1 drop Both Eyes QHS  . pantoprazole  40 mg Oral BID  . predniSONE  5 mg Oral Daily   Continuous Infusions:  PRN Meds: acetaminophen, albuterol, ondansetron (ZOFRAN) IV, traMADol   Vital Signs    Vitals:   08/26/20 1441 08/26/20 2021 08/27/20 0004 08/27/20 0427  BP: (!) 141/72 112/61 (!) 141/67 138/72  Pulse: 75 72 65 73  Resp: 15 16 14 17   Temp: 97.6 F (36.4 C) 98.4 F (36.9 C) 98.5 F (36.9 C) 98.4 F (36.9 C)  TempSrc: Oral Oral Oral Oral  SpO2: 96% 99% 100% 93%  Weight:      Height:        Intake/Output Summary (Last 24 hours) at 08/27/2020 0939 Last data filed at 08/27/2020 0012 Gross per 24 hour  Intake 480 ml  Output 1 ml  Net 479 ml   Last 3 Weights 08/26/2020 08/13/2020 07/17/2020  Weight (lbs) 244 lb 240 lb 240 lb 6.4 oz  Weight (kg) 110.678 kg 108.863 kg 109.045 kg      Telemetry    Sinus rhythm with baseline rates in the 50s to 60s. Occasional spikes in the 70s to 80s - Personally Reviewed  ECG    Normal sinus rhythm, rate 71 bpm, with 1st degree AV block and PVC. No acute ST/T changes. - Personally Reviewed  Physical Exam   GEN: No acute distress.    Neck: No JVD. Cardiac: RRR. No murmurs, rubs, or gallops.  Respiratory: Clear to auscultation bilaterally. No wheezes, rhonchi, or rales. GI: Soft, non-distended, and non-tender. MS: No lower extremity edema. No deformity. Skin: Warm and dry. Neuro:  No focal deficits. Psych: Normal affect. Responds appropriately.  Labs    High Sensitivity Troponin:   Recent Labs  Lab 08/26/20 0216 08/26/20 0439 08/27/20 0317 08/27/20 0656  TROPONINIHS 8 13 10 8       Chemistry Recent Labs  Lab 08/26/20 0216 08/27/20 0317  NA 137 138  K 3.4* 3.4*  CL 102 103  CO2 28 27  GLUCOSE 104* 101*  BUN 15 11  CREATININE 1.24 0.97  CALCIUM 9.3 9.5  GFRNONAA 59* >60  ANIONGAP 7 8     Hematology Recent Labs  Lab 08/26/20 0216  WBC 6.1  RBC 4.71  HGB 10.3*  HCT 34.6*  MCV 73.5*  MCH 21.9*  MCHC 29.8*  RDW 15.0  PLT 205    BNPNo results for input(s): BNP, PROBNP in the last 168 hours.   DDimer No results for input(s): DDIMER in the last 168 hours.   Radiology    DG Chest 2 View  Result Date: 08/26/2020 CLINICAL DATA:  Chest pain  EXAM: CHEST - 2 VIEW COMPARISON:  Chest CT May 15, 2020 in chest radiograph May 06, 2020 FINDINGS: The heart size and mediastinal contours are within normal limits. Aortic atherosclerosis. Reduced lung volumes with bibasilar atelectasis. No visible pleural effusion or pneumothorax. Thoracic spondylosis. IMPRESSION: Reduced lung volumes with bibasilar atelectasis. Electronically Signed   By: Dahlia Bailiff MD   On: 08/26/2020 03:09    Cardiac Studies   Left Cardiac Catheterization 08/01/2019:  Prox RCA lesion is 40% stenosed.  Mid RCA lesion is 40% stenosed.  Mid RCA to Dist RCA lesion is 50% stenosed.  RPDA lesion is 70% stenosed.  3rd RPL lesion is 70% stenosed.  Prox Cx to Dist Cx lesion is 40% stenosed.  Prox LAD to Mid LAD lesion is 40% stenosed.  Mid LAD lesion is 40% stenosed.  Dist LAD lesion is 70% stenosed.  The left  ventricular systolic function is normal.  LV end diastolic pressure is normal.  The left ventricular ejection fraction is 55-65% by visual estimate.  There is no mitral valve regurgitation.   1. Moderate non-obstructive disease in the LAD and Circumflex 2. Moderate non-obstructive disease in the RCA. Severe disease in the small caliber PDA, too small for PCI 3. Normal LV systolic function  Recommendations: Continue medical management of CAD  Diagnostic Dominance: Right     Patient Profile     80 y.o. male with a history of CAD s/p BMS to OM2 in 2014 with most recent cath in 07/2019 showing severe disease in small caliber PDA (too small for PCI) but otherwise moderate non-obstructive disease, PAD, hypertension, hyperlipidemia, type 2 diabetes mellitus, CKD stage III, GERD, prostate cancer, and Non-Hodgkin's Lymphoma/MALT of orbit who was admitted for evaluation of chest pain at the request of Dr. Lorin Mercy.  Assessment & Plan    Chest Pain History of CAD - History of CAD with prior stent to OM2 in 2014 as stated above. He has known severe disease in a small PDA but otherwise non-obstructive disease on last cath in 07/2019. Presented with chest pain that woke him from sleep.  - Patient had another episode of brief chest pain this morning. - EKG shows no acute ischemic changes.  - High-sensitivity troponin negative x4. - Echo pending.  - Continue Amlodipine 10mg  daily. - Home Imdur increased to 60mg  daily yesterday. - Continue aspirin and statin.  - If Echo unremarkable, patient can likely be discharged. There was discussion about possible outpatient Myoview. Patient does have intermittent Wenckebach so I will discuss with MD about whether Lexiscan in contraindicated in this setting.  Sinus Bradycardia Intermittent 2nd Degree Type 1 (Wenckebach) - Asymptomatic. - Not on any AV nodal agents.   Hypertension - BP well controlled. - Continue Amlodipine 10mg  daily and Imdur 60mg   daily.   Hyperlipidemia - Lipid panel in 07/2020: Total Cholesterol 131, Triglycerides 127, HDL 43, LDL 65.  - LDL goal <70 given CAD. - Continue home Pravastatin 40mg  daily.  Otherwise, per primary team: - Type 2 diabetes mellitus - CKD stage II - Chronic anemia - MALT   For questions or updates, please contact [place] Please consult [email or web address] for contact info under     Signed, Darreld Mclean, PA-C  08/27/2020, 9:39 AM    Patient seen, examined. Available data reviewed. Agree with findings, assessment, and plan as outlined by Sande Rives, PA-C.  The patient is independently interviewed and examined.  On my exam he is alert, oriented, in no distress.  JVP is normal, lungs  are clear bilaterally, heart is regular rate and rhythm with no murmur gallop, abdomen is soft and nontender, extremities have no edema, skin is warm dry with no rash.  Repeat troponin this morning remains normal at 8.  EKG shows normal sinus rhythm with first-degree AV block, heart rate 71 bpm no acute ST/T wave changes.  An echocardiogram has been completed this morning and the formal interpretation is pending.  I have preliminarily reviewed the echo images and the patient has vigorous LV systolic function with no regional wall motion abnormalities.  There is no pericardial effusion.  I do not appreciate any significant valvular disease.  I think the patient is stable for hospital discharge.  I think he is having noncardiac chest pain.  We had considered a Lexiscan Myoview stress test.  However he has first-degree AV block and periods of AV [employer].  I think he has a risk of bradycardia arrhythmias with Lexiscan and would favor ongoing medical treatment unless his symptoms progress.  We will hold off on arranging an outpatient stress test and will plan to see him back in the office for cardiology follow-up.  The patient is medically stable for hospital discharge today.  Sherren Mocha,  M.D. 08/27/2020 1:56 PM

## 2020-09-03 ENCOUNTER — Other Ambulatory Visit: Payer: Self-pay

## 2020-09-03 ENCOUNTER — Other Ambulatory Visit: Payer: Medicare HMO | Admitting: *Deleted

## 2020-09-03 DIAGNOSIS — I10 Essential (primary) hypertension: Secondary | ICD-10-CM

## 2020-09-03 DIAGNOSIS — E782 Mixed hyperlipidemia: Secondary | ICD-10-CM | POA: Diagnosis not present

## 2020-09-03 DIAGNOSIS — I25118 Atherosclerotic heart disease of native coronary artery with other forms of angina pectoris: Secondary | ICD-10-CM | POA: Diagnosis not present

## 2020-09-03 LAB — LIPID PANEL
Chol/HDL Ratio: 2.9 ratio (ref 0.0–5.0)
Cholesterol, Total: 115 mg/dL (ref 100–199)
HDL: 39 mg/dL — ABNORMAL LOW (ref >39)
LDL Chol Calc (NIH): 49 mg/dL (ref 0–99)
Triglycerides: 158 mg/dL — ABNORMAL HIGH (ref 0–149)
VLDL Cholesterol Cal: 27 mg/dL (ref 5–40)

## 2020-09-03 LAB — HEPATIC FUNCTION PANEL
ALT: 13 IU/L (ref 0–44)
AST: 15 IU/L (ref 0–40)
Albumin: 4.2 g/dL (ref 3.7–4.7)
Alkaline Phosphatase: 92 IU/L (ref 44–121)
Bilirubin Total: <0.2 mg/dL (ref 0.0–1.2)
Bilirubin, Direct: 0.1 mg/dL (ref 0.00–0.40)
Total Protein: 6.3 g/dL (ref 6.0–8.5)

## 2020-09-05 ENCOUNTER — Telehealth: Payer: Self-pay | Admitting: Cardiovascular Disease

## 2020-09-05 NOTE — Telephone Encounter (Signed)
Reviewed results w patient.

## 2020-09-05 NOTE — Telephone Encounter (Signed)
Patient is returning call to discuss lab results. 

## 2020-09-10 ENCOUNTER — Other Ambulatory Visit: Payer: Self-pay | Admitting: Family Medicine

## 2020-09-10 NOTE — Telephone Encounter (Signed)
Looks like hospital discontinued med at discharge

## 2020-09-16 ENCOUNTER — Other Ambulatory Visit: Payer: Self-pay | Admitting: Family Medicine

## 2020-09-17 ENCOUNTER — Other Ambulatory Visit: Payer: Self-pay | Admitting: Family Medicine

## 2020-09-17 NOTE — Telephone Encounter (Signed)
Pt was notified today that this was discontinued off this medication

## 2020-09-24 NOTE — Progress Notes (Signed)
Office Visit    Patient Name: Joel [name] Date of Encounter: 09/24/2020  PCP:  Girtha Rm, NP-C   [facility]  Cardiologist:  Lauree Chandler, MD  Advanced Practice Provider:  No care team member to display Electrophysiologist:  None   Chief Complaint    Joel [name] is a 80 y.o. male with a hx of CAD s/p BMS to OM2 in 2014 with cath 07/2019 showing severe disease in small caliber PDA (too small for PCA) with otherwise moderate nonobstructive disease, PAD, HTN, HLD, DM2, CKDIII, GERD, prostate cancer, non-hodgkin's lympha/MALT of orbit presents today for follow up after hospitalization for chest pain.   Past Medical History    Past Medical History:  Diagnosis Date   Atypical chest pain    CAD (coronary artery disease)    a. Multiple prior evaluations then 05/2012 s/p BMS to OM2. b. Nuc 11/2012 wnl. c. Cath 2015: patent stent, moderate LAD/RCA dz, treated medically.   Cataract    CKD (chronic kidney disease), stage II    COPD (chronic obstructive pulmonary disease) (HCC)    Cough secondary to angiotensin converting enzyme inhibitor (ACE-I)    Diabetes mellitus without complication (HCC)    Diverticulosis    DVT (deep venous thrombosis) ([place])    Family history of breast cancer in first degree relative    GERD (gastroesophageal reflux disease)    Headache(784.0)    Hx: of   History of blood transfusion    "I've had 2; don't know what it was related to" (11/09/2012)   HLD (hyperlipidemia)    HTN (hypertension)    Iron deficiency anemia    Lymphoproliferative disorder, low grade B cell ([place]) 05/21/2020   Microcytic anemia    Peripheral vascular disease ([place])    a. L pop-tibial bypass 2011, followed by [facility].   Personal history of prostate cancer    Rotator cuff injury    "left arm; never repaired" (11/09/2012)   Past Surgical History:  Procedure Laterality Date   CARDIOVASCULAR STRESS TEST  Aug. 2014   CATARACT EXTRACTION W/  INTRAOCULAR LENS IMPLANT Right ~ 2008   CIRCUMCISION     COLONOSCOPY     CORONARY ANGIOPLASTY WITH STENT PLACEMENT  2014   "1" (11/09/2012)   ILIAC ARTERY ANEURYSM REPAIR     LEFT HEART CATH  05-31-12   LEFT HEART CATH AND CORONARY ANGIOGRAPHY N/A 08/01/2019   Procedure: LEFT HEART CATH AND CORONARY ANGIOGRAPHY;  Surgeon: Burnell Blanks, MD;  Location: [facility];  Service: Cardiovascular;  Laterality: N/A;   LEFT HEART CATHETERIZATION WITH CORONARY ANGIOGRAM N/A 06/01/2011   Procedure: LEFT HEART CATHETERIZATION WITH CORONARY ANGIOGRAM;  Surgeon: Hillary Bow, MD;  Location[facility] C[facility] LAB;  Service: Cardiovascular;  Laterality: N/A;   LEFT HEART CATHETERIZATION WITH CORONARY ANGIOGRAM N/A 05/31/2012   Procedure: LEFT HEART CATHETERIZATION WITH CORONARY ANGIOGRAM;  Surgeon: Peter M Martinique, MD;  Location: [facility] CATH LAB;  Service: Cardiovascular;  Laterality: N/A;   LEFT HEART CATHETERIZATION WITH CORONARY ANGIOGRAM N/A 08/01/2013   Procedure: LEFT HEART CATHETERIZATION WITH CORONARY ANGIOGRAM;  Surgeon: Burnell Blanks, MD;  Location: [facility] CATH LAB;  Service: Cardiovascular;  Laterality: N/A;   PERCUTANEOUS CORONARY STENT INTERVENTION (PCI-S)  05/31/2012   Procedure: PERCUTANEOUS CORONARY STENT INTERVENTION (PCI-S);  Surgeon: Peter M Martinique, MD;  Location: [facility] CATH LAB;  Service: Cardiovascular;;   PR VEIN BYPASS GRAFT,AORTO-FEM-POP Left 10/01/2009   left below knee popliteal artery to posterior tibial artery  SHOULDER ARTHROSCOPY WITH OPEN ROTATOR CUFF REPAIR AND DISTAL CLAVICLE ACROMINECTOMY Left 01/12/2013   Procedure: LEFT SHOULDER ARTHROSCOPY WITH DEBRIDEMENT OPEN DISTAL CLAVICLE RESECTION ,acromioplastyAND ROTATOR CUFF REPAIR;  Surgeon: Yvette Rack., MD;  Location: [place];  Service: Orthopedics;  Laterality: Left;    Allergies  Allergies  Allergen Reactions   Topiramate Shortness Of Breath and Other (See Comments)     leg cramps   Atenolol Other (See Comments)     bradycardia   Lisinopril Cough   Tramadol-Acetaminophen Cough    Tolerates plain Tramadol    History of Present Illness    Joel [name] is a 80 y.o. male with a hx of CAD s/p BMS to OM2 in 2014 with cath 07/2019 showing severe disease in small caliber PDA (too small for PCA) with otherwise moderate nonobstructive disease, PAD, HTN, HLD, DM2, CKDIII, GERD, prostate cancer, non-hodgkin's lympha/MALT of orbit last seen while hospitalized.  Previous lower extremity ABI 08/08/20 with patent left popliteal to posterior tibial artery bypass graft with no visualized stenosis and 50-74% stenosis noted in the superficial femoral artery. AAA duplex 08/08/20 with right common iliac artery aneurysm measuring 4.5x4.9cm with limited visualization. He has had previous endovascular aneurysm repair.  He was admitted 08/26/20 with chest pain that woke him from sleep. EKG with no acute changes. HS-troponin negative x4. Echocardiogram with LVEF 65%, no RWMA, gr1DD, RV normal size and function, trivial AI, mild dilation aortic root 43mm and ascending aorta 38mm whough within normal limit for age and BSA. Myoview was deferred due to bradycardia and Wenckebach. He was recommended for medical therapy. His Imdur was increased to 60mg  daily.   He presents today for follow up.  Reports no recurrent chest pain, pressure, tightness since hospital visit and increased dose of Imdur.  Reports no lightheadedness, dizziness, near-syncope, syncope.  Blood pressure this morning at home prior to medications with systolic 885.  He does have a headache but tells [place] that this is longstanding and he normally gets "needles in his head "likely Botox with relief.  Low suspicion this is related to increased dose of Imdur. No formal exercise routine due to previous back surgery.  Reports 2-week history of productive cough for which she has been taking Claritin-D and wonders whether this could be related to his tramadol and plans to discuss with  his pain clinic.  Reports no shortness of breath at rest and stable dyspnea on exertion.  No edema, orthopnea, PND.  EKGs/Labs/Other Studies Reviewed:   The following studies were reviewed today:  Echocardiogram 08/27/20 1. Left ventricular ejection fraction, by estimation, is 65%. The left  ventricle has normal function. The left ventricle has no regional wall  motion abnormalities. Left ventricular diastolic parameters are consistent  with Grade I diastolic dysfunction  (impaired relaxation).   2. Right ventricular systolic function is normal. The right ventricular  size is normal.   3. The mitral valve is grossly normal. No evidence of mitral valve  regurgitation. No evidence of mitral stenosis.   4. The aortic valve was not well visualized. Aortic valve regurgitation  is trivial. No aortic stenosis is present.   5. Aortic dilatation noted. There is mild dilatation of the aortic root,  measuring 41 mm. There is mild dilatation of the ascending aorta,  measuring 40 mm. This is within normal limits for age and BSA.  Left Cardiac Catheterization 08/01/2019: Prox RCA lesion is 40% stenosed. Mid RCA lesion is 40% stenosed. Mid RCA to Dist RCA lesion is  50% stenosed. RPDA lesion is 70% stenosed. 3rd RPL lesion is 70% stenosed. Prox Cx to Dist Cx lesion is 40% stenosed. Prox LAD to Mid LAD lesion is 40% stenosed. Mid LAD lesion is 40% stenosed. Dist LAD lesion is 70% stenosed. The left ventricular systolic function is normal. LV end diastolic pressure is normal. The left ventricular ejection fraction is 55-65% by visual estimate. There is no mitral valve regurgitation.   1. Moderate non-obstructive disease in the LAD and Circumflex 2. Moderate non-obstructive disease in the RCA. Severe disease in the small caliber PDA, too small for PCI 3. Normal LV systolic function   Recommendations: Continue medical management of CAD   Diagnostic Dominance: Right       EKG: No EKG  today  Recent Labs: 02/14/2020: Magnesium 2.0 07/17/2020: TSH 1.620 08/26/2020: Hemoglobin 10.3; Platelets 205 08/27/2020: BUN 11; Creatinine, Ser 0.97; Potassium 3.4; Sodium 138 09/03/2020: ALT 13  Recent Lipid Panel    Component Value Date/Time   CHOL 115 09/03/2020 0856   TRIG 158 (H) 09/03/2020 0856   HDL 39 (L) 09/03/2020 0856   CHOLHDL 2.9 09/03/2020 0856   CHOLHDL 5 02/06/2013 0741   VLDL 24.4 02/06/2013 0741   LDLCALC 49 09/03/2020 0856    Home Medications   No outpatient medications have been marked as taking for the 09/25/20 encounter (Appointment) with Loel Dubonnet, NP.     Review of Systems   All other systems reviewed and are otherwise negative except as noted above.  Physical Exam    VS:  There were no vitals taken for this visit. , BMI There is no height or weight on file to calculate BMI.  Wt Readings from Last 3 Encounters:  08/26/20 244 lb (110.7 kg)  08/13/20 240 lb (108.9 kg)  07/17/20 240 lb 6.4 oz (109 kg)     GEN: Well nourished, well developed, in no acute distress. HEENT: normal. Neck: Supple, no JVD, carotid bruits, or masses. Cardiac: RRR, no murmurs, rubs, or gallops. No clubbing, cyanosis, edema.  Radials/PT 2+ and equal bilaterally.  Respiratory:  Respirations regular and unlabored, clear to auscultation bilaterally. GI: Soft, nontender, nondistended. MS: No deformity or atrophy. Skin: Warm and dry, no rash. Neuro:  Strength and sensation are intact. Psych: Normal affect.  Assessment & Plan    CAD - Stable with no anginal symptoms since increased dose of Imdur.  No recurrent chest pain since hospitalization.  No indication for ischemic evaluation at this time.  If further ischemic evaluation needed would need to carefully consider risk first benefit of Myoview given risk for bradycardic arrhythmia.  GDMT includes Imdur, PRN nitroglycerin, pravastatin, aspirin.  No beta-blocker secondary to baseline bradycardia.  Imdur refill provided.  Heart healthy diet and regular cardiovascular exercise encouraged.   Sinus brady cardia / Intermittent 2nd degree type 1 (Wenckebach) - Asymptomatic with no lightheadedness, dizziness, near-syncope, syncope. Not on AV nodal blocking agents.   HTN -  BP well controlled. Continue current antihypertensive regimen.   HLD - Continue Pravastatin 40mg  daily.   DM2 - Continue to follow with PCP.  CKD III - Careful titration of diuretic and antihypertensive.   Anemia - Continue to follow with PCP.  PAD - Continue to follow with vascular surgery.   Disposition: Follow up in 4-6 month(s) with Dr. Angelena Form or APP   Signed, Loel Dubonnet, NP 09/24/2020, 8:06 PM [place]

## 2020-09-25 ENCOUNTER — Encounter (INDEPENDENT_AMBULATORY_CARE_PROVIDER_SITE_OTHER): Payer: Self-pay

## 2020-09-25 ENCOUNTER — Other Ambulatory Visit: Payer: Self-pay

## 2020-09-25 ENCOUNTER — Ambulatory Visit: Payer: Medicare HMO | Admitting: Family

## 2020-09-25 ENCOUNTER — Encounter: Payer: Self-pay | Admitting: Family

## 2020-09-25 VITALS — BP 116/60 | HR 74 | Ht 69.0 in | Wt 237.8 lb

## 2020-09-25 DIAGNOSIS — N1831 Chronic kidney disease, stage 3a: Secondary | ICD-10-CM

## 2020-09-25 DIAGNOSIS — M542 Cervicalgia: Secondary | ICD-10-CM | POA: Diagnosis not present

## 2020-09-25 DIAGNOSIS — M791 Myalgia, unspecified site: Secondary | ICD-10-CM | POA: Diagnosis not present

## 2020-09-25 DIAGNOSIS — R519 Headache, unspecified: Secondary | ICD-10-CM | POA: Diagnosis not present

## 2020-09-25 DIAGNOSIS — E785 Hyperlipidemia, unspecified: Secondary | ICD-10-CM

## 2020-09-25 DIAGNOSIS — I739 Peripheral vascular disease, unspecified: Secondary | ICD-10-CM

## 2020-09-25 DIAGNOSIS — I25118 Atherosclerotic heart disease of native coronary artery with other forms of angina pectoris: Secondary | ICD-10-CM

## 2020-09-25 DIAGNOSIS — I10 Essential (primary) hypertension: Secondary | ICD-10-CM | POA: Diagnosis not present

## 2020-09-25 DIAGNOSIS — E782 Mixed hyperlipidemia: Secondary | ICD-10-CM | POA: Diagnosis not present

## 2020-09-25 DIAGNOSIS — G518 Other disorders of facial nerve: Secondary | ICD-10-CM | POA: Diagnosis not present

## 2020-09-25 DIAGNOSIS — I441 Atrioventricular block, second degree: Secondary | ICD-10-CM | POA: Diagnosis not present

## 2020-09-25 MED ORDER — ISOSORBIDE MONONITRATE ER 60 MG PO TB24: 60.0000 mg | ORAL_TABLET | Freq: Every day | ORAL | 1 refills | Status: DC

## 2020-09-25 NOTE — Patient Instructions (Addendum)
Medication Instructions:  Your physician recommends that you continue on your current medications as directed. Please refer to the Current Medication list given to you today.  Continue Imdur 60mg  daily - we sent a refill today.  You may use over the counter Guafenesin (Muccinex) to thin out your cough if needed.  Recommend talking to your pain doctor about your Tramadol.   *If you need a refill on your cardiac medications before your next appointment, please call your pharmacy*   Lab Work: None ordered  If you have labs (blood work) drawn today and your tests are completely normal, you will receive your results only by: [place] (if you have MyChart) OR A paper copy in the mail If you have any lab test that is abnormal or we need to change your treatment, we will call you to review the results.   Testing/Procedures: None ordered   Follow-Up: At [facility], you and your health needs are our priority.  As part of our continuing mission to provide you with exceptional heart care, we have created designated Provider Care Teams.  These Care Teams include your primary Cardiologist (physician) and Advanced Practice Providers (APPs -  Physician Assistants and Nurse Practitioners) who all work together to provide you with the care you need, when you need it.  We recommend signing up for the patient portal called "MyChart".  Sign up information is provided on this After Visit Summary.  MyChart is used to connect with patients for Virtual Visits (Telemedicine).  Patients are able to view lab/test results, encounter notes, upcoming appointments, etc.  Non-urgent messages can be sent to your provider as well.   To learn more about what you can do with MyChart, go to [email or web address].    Your next appointment:   4-6 month(s)    03/26/2021 arrive at 9:45      The format for your next appointment:   In Person  Provider:   You may see Lauree Chandler, MD or one of the  following Advanced Practice Providers on your designated Care Team:   Melina Copa, PA-C Ermalinda Barrios, PA-C   Other Instructions  Heart Healthy Diet Recommendations: A low-salt diet is recommended. Meats should be grilled, baked, or boiled. Avoid fried foods. Focus on lean protein sources like fish or chicken with vegetables and fruits. The American Heart Association is a Microbiologist!  American Heart Association Diet and Lifeystyle Recommendations   Exercise recommendations: The American Heart Association recommends 150 minutes of moderate intensity exercise weekly. Try 30 minutes of moderate intensity exercise 4-5 times per week. This could include walking, jogging, or swimming.

## 2020-10-15 ENCOUNTER — Telehealth: Payer: Self-pay

## 2020-10-15 ENCOUNTER — Telehealth: Payer: Self-pay | Admitting: Family Medicine

## 2020-10-15 DIAGNOSIS — M5116 Intervertebral disc disorders with radiculopathy, lumbar region: Secondary | ICD-10-CM | POA: Diagnosis not present

## 2020-10-15 DIAGNOSIS — M545 Low back pain, unspecified: Secondary | ICD-10-CM

## 2020-10-15 DIAGNOSIS — M4326 Fusion of spine, lumbar region: Secondary | ICD-10-CM | POA: Diagnosis not present

## 2020-10-15 MED ORDER — TRAMADOL HCL 50 MG PO TABS: ORAL_TABLET | ORAL | 0 refills | Status: DC

## 2020-10-15 NOTE — Telephone Encounter (Signed)
Per med list tramadol refilled today.

## 2020-10-15 NOTE — Telephone Encounter (Signed)
Patient called in requesting a refill on Tramadol. He states the medication is working and he is running low.   I reviewed the chart and I advised patient that the last provider that prescribed the medication was a provider at his primary care office. I advised him to contact their office as well. I made him aware that I would send Dr Camillia Herter nurse a message and get back to him. He voiced understanding.

## 2020-10-15 NOTE — Telephone Encounter (Signed)
Pt left voicemail requesting refill on Tramadol

## 2020-10-20 ENCOUNTER — Telehealth: Payer: Self-pay | Admitting: Cardiovascular Disease

## 2020-10-20 MED ORDER — NITROGLYCERIN 0.4 MG SL SUBL: 0.4000 mg | SUBLINGUAL_TABLET | SUBLINGUAL | 3 refills | Status: DC | PRN

## 2020-10-20 NOTE — Telephone Encounter (Signed)
Patient in his chest and on his left side. Wants to talk to the dr about going forward with an operation. Please Joel [name]

## 2020-10-20 NOTE — Telephone Encounter (Signed)
Pt advised no new recommendations per Dr. Angelena Form and he will pick up his nitro and continue to monitor his symptoms.

## 2020-10-20 NOTE — Telephone Encounter (Signed)
Pt called to report that he has been having on and off chest pain sharp in nature for the last several days. Mostly when he wakes up in the morning and lasts for several minutes. It is worse with movement and gets better when he takes a Tramadol. He says his Leron Croak is old so he has not taken any, I sent in a new RX for his Nitro. Pt says he does not have any other symptoms such as dizziness, dyspnea, palpitations.   He is unsure of his vital signs.   He denies fever, cough. No N/V.   Pt to use his Nitro if needed. He is not having pain right now but did when he woke up this morning.He took a Tramadol.   He will call EMS or have someone take him tot he ED if he develops any further pain that worsens. I will forward to the Provider for recommendations.

## 2020-10-29 DIAGNOSIS — Z79899 Other long term (current) drug therapy: Secondary | ICD-10-CM | POA: Diagnosis not present

## 2020-10-29 DIAGNOSIS — Z95828 Presence of other vascular implants and grafts: Secondary | ICD-10-CM | POA: Diagnosis not present

## 2020-10-29 DIAGNOSIS — D509 Iron deficiency anemia, unspecified: Secondary | ICD-10-CM | POA: Diagnosis not present

## 2020-10-29 DIAGNOSIS — C884 Extranodal marginal zone B-cell lymphoma of mucosa-associated lymphoid tissue [MALT-lymphoma]: Secondary | ICD-10-CM | POA: Diagnosis not present

## 2020-10-29 DIAGNOSIS — I723 Aneurysm of iliac artery: Secondary | ICD-10-CM | POA: Diagnosis not present

## 2020-11-17 DIAGNOSIS — M5116 Intervertebral disc disorders with radiculopathy, lumbar region: Secondary | ICD-10-CM | POA: Diagnosis not present

## 2020-11-17 DIAGNOSIS — M4326 Fusion of spine, lumbar region: Secondary | ICD-10-CM | POA: Diagnosis not present

## 2020-11-18 ENCOUNTER — Telehealth: Payer: Self-pay | Admitting: Nurse Practitioner

## 2020-11-18 NOTE — Telephone Encounter (Signed)
Patient called states he is having a lot of abdominal pain, nausea and vomiting seeking help said its been about two weeks now.

## 2020-11-18 NOTE — Telephone Encounter (Signed)
I think this was meant for you. It looks like he was seen a few months by Vascular due to abdominal pain. He has a history of AAA.

## 2020-11-18 NOTE — Telephone Encounter (Signed)
Called the patient back. No answer. Left message to call us again and ask to speak with me.  Of note from Care Everywhere PET CT 10/29/20 " CONCLUSIONS:  1.  Compared to the PET/CT done on 05/14/2020, there is persistent FDG avid disease above and below the diaphragm, with uptake similar to prior (within 30%) which constitutes [place] 4 and stable disease by the [facility] classification.  2.  Persistent bilateral hypermetabolic parotid lesions versus lymph nodes, with interval mild increased uptake in the left parotid gland. It is unclear whether these lesions are related to the primary lymphoma versus primary parotid neoplasm. ENT consultation may help differentiate.  3.  Right common iliac artery aneurysm status post aortobiiliac stenting. New nonspecific tracer uptake in the aneurysmal wall, which can be seen in vascular instability/pending rupture versus graft infection. Correlate with clinical history. If there is concern for infection, nuclear white blood cell imaging may further evaluate.  4.  Additional CT findings as above. "

## 2020-12-03 ENCOUNTER — Ambulatory Visit: Payer: Medicare HMO | Admitting: Vascular Surgery

## 2020-12-10 ENCOUNTER — Encounter: Payer: Self-pay | Admitting: Vascular Surgery

## 2020-12-10 ENCOUNTER — Ambulatory Visit (INDEPENDENT_AMBULATORY_CARE_PROVIDER_SITE_OTHER): Payer: Medicare HMO | Admitting: Vascular Surgery

## 2020-12-10 ENCOUNTER — Ambulatory Visit: Payer: Medicare HMO

## 2020-12-10 ENCOUNTER — Other Ambulatory Visit: Payer: Self-pay

## 2020-12-10 VITALS — BP 130/65 | HR 54 | Temp 98.1°F | Resp 18 | Ht 69.0 in | Wt 228.0 lb

## 2020-12-10 DIAGNOSIS — H35033 Hypertensive retinopathy, bilateral: Secondary | ICD-10-CM | POA: Diagnosis not present

## 2020-12-10 DIAGNOSIS — H0102A Squamous blepharitis right eye, upper and lower eyelids: Secondary | ICD-10-CM | POA: Diagnosis not present

## 2020-12-10 DIAGNOSIS — Z961 Presence of intraocular lens: Secondary | ICD-10-CM | POA: Diagnosis not present

## 2020-12-10 DIAGNOSIS — H0102B Squamous blepharitis left eye, upper and lower eyelids: Secondary | ICD-10-CM | POA: Diagnosis not present

## 2020-12-10 DIAGNOSIS — I714 Abdominal aortic aneurysm, without rupture, unspecified: Secondary | ICD-10-CM

## 2020-12-10 DIAGNOSIS — I739 Peripheral vascular disease, unspecified: Secondary | ICD-10-CM

## 2020-12-10 DIAGNOSIS — C6982 Malignant neoplasm of overlapping sites of left eye and adnexa: Secondary | ICD-10-CM | POA: Diagnosis not present

## 2020-12-10 DIAGNOSIS — H43811 Vitreous degeneration, right eye: Secondary | ICD-10-CM | POA: Diagnosis not present

## 2020-12-10 DIAGNOSIS — H401231 Low-tension glaucoma, bilateral, mild stage: Secondary | ICD-10-CM | POA: Diagnosis not present

## 2020-12-10 DIAGNOSIS — H16223 Keratoconjunctivitis sicca, not specified as Sjogren's, bilateral: Secondary | ICD-10-CM | POA: Diagnosis not present

## 2020-12-10 DIAGNOSIS — H524 Presbyopia: Secondary | ICD-10-CM | POA: Diagnosis not present

## 2020-12-10 NOTE — Progress Notes (Signed)
ASSESSMENT & PLAN   STATUS POST ENDOVASCULAR ANEURYSM REPAIR: I believe this patient underwent an endovascular aneurysm repair in [facility] in the remote past.  He cannot remember the details.  Given the findings on the PET scan I have recommended a CT angiogram of the abdomen and pelvis to reevaluate his stent graft.  I think would be very unlikely that he has an infection as he has no fever or other systemic symptoms to suggest infection.  This sounds like a pretty soft finding on his PET scan.  Regardless we will see what his CT angio shows.  My main concern with his stent graft is that there is some ectasia above the stent just below the renal arteries.  If this progressed he would likely have to be sent back to [facility] for a fenestrated graft to address this pararenal component.  I will see him back after his CT angio.  PERIPHERAL VASCULAR DISEASE: I performed a left below-knee popliteal to posterior tibial artery bypass graft back in 2011.  He was then lost to follow-up.  When he comes back in after his CT angio of the abdomen and pelvis I will obtain a graft duplex and ABIs.  I have encouraged him to stay as active as possible.  Fortunately he is not a smoker.  REASON FOR CONSULT:    Abnormality seen on PET scan.  The consult is requested by Legrand Pitts, NP  HPI:   Joel [name] is a 80 y.o. male who I last saw on 08/13/2020 with a right common iliac artery aneurysm.  Patient has a known abdominal aortic aneurysm and right common iliac artery aneurysm.  I looked at a CT scan from December of last year and this clearly showed he had undergone a previous endovascular aneurysm repair.  I believe this was done in [place].  When I saw him in May of this year he had recommended a follow-up study in 1 year.  However, in the interim, he had a low-grade B-cell lymphoma and was undergoing treatment.  He had a PET scan to monitor his treatment response.  He was noted to have a right common  iliac artery aneurysm measuring 4.5 to 5.8 cm in diameter although not sure if the 5.8 cm refers to length.  Based on his previous CT scan the aneurysm was 4.3 cm in maximum diameter.  The report noted "new nonspecific tracer uptake in the aneurysmal wall of the right common iliac artery."  For this patient the patient was referred here for further evaluation.  On my history, his only complaint is incontinence of his bowels.  He has some abdominal pain intermittently.  He is scheduled to see a gastroenterologist.  But he tells me that until now no one can really tell him what this is from.  The patient does have a history of melanoma of the left eyelid and also history of prostate cancer.  In addition, I previously performed below-knee popliteal to posterior tibial artery bypass on the left in 2011.  He denies any history of claudication, rest pain, or nonhealing ulcers.  He is not a smoker.  Past Medical History:  Diagnosis Date   Atypical chest pain    CAD (coronary artery disease)    a. Multiple prior evaluations then 05/2012 s/p BMS to OM2. b. Nuc 11/2012 wnl. c. Cath 2015: patent stent, moderate LAD/RCA dz, treated medically.   Cataract    CKD (chronic kidney disease), stage II  COPD (chronic obstructive pulmonary disease) (HCC)    Cough secondary to angiotensin converting enzyme inhibitor (ACE-I)    Diabetes mellitus without complication (HCC)    Diverticulosis    DVT (deep venous thrombosis) ([place])    Family history of breast cancer in first degree relative    GERD (gastroesophageal reflux disease)    Headache(784.0)    Hx: of   History of blood transfusion    "I've had 2; don't know what it was related to" (11/09/2012)   HLD (hyperlipidemia)    HTN (hypertension)    Iron deficiency anemia    Lymphoproliferative disorder, low grade B cell ([place]) 05/21/2020   Microcytic anemia    Peripheral vascular disease ([place])    a. L pop-tibial bypass 2011, followed by [facility].   Personal history  of prostate cancer    Rotator cuff injury    "left arm; never repaired" (11/09/2012)    Family History  Problem Relation Age of Onset   Breast cancer Mother        dx 71s   Hyperlipidemia Mother    Hypertension Mother    COPD Father        deceased   Hyperlipidemia Father    Hypertension Father    Hypertension Sister    Cancer Brother 46       Unknown cancer, possibly stomach   Hypertension Brother    Cancer Brother 7       Unknown, possibly lung   Hypertension Daughter    Hypertension Son    Colon cancer Neg Hx        pt is unclear about family hx    SOCIAL HISTORY: Social History   Tobacco Use   Smoking status: Former    Packs/day: 0.50    Years: 52.00    Pack years: 26.00    Types: Cigarettes    Quit date: 05/26/2011    Years since quitting: 9.5   Smokeless tobacco: Never  Substance Use Topics   Alcohol use: No    Allergies  Allergen Reactions   Topiramate Shortness Of Breath and Other (See Comments)     leg cramps   Atenolol Other (See Comments)    bradycardia   Lisinopril Cough   Tramadol-Acetaminophen Cough    Tolerates plain Tramadol    Current Outpatient Medications  Medication Sig Dispense Refill   abiraterone acetate (ZYTIGA) 250 MG tablet Take 1,000 mg by mouth daily.      Accu-Chek FastClix Lancets MISC Test once daily 102 each 12   acetaminophen (TYLENOL) 500 MG tablet Take 1,000 mg by mouth every 6 (six) hours as needed for mild pain or headache.     albuterol (VENTOLIN HFA) 108 (90 Base) MCG/ACT inhaler INHALE 2 PUFFS INTO THE LUNGS EVERY 6 HOURS AS NEEDED FOR WHEEZING OR SHORTNESS OF BREATH 54 g 0   amLODipine (NORVASC) 10 MG tablet Take 1 tablet (10 mg total) by mouth daily. 90 tablet 3   aspirin EC 81 MG tablet Take 81 mg by mouth daily.      brimonidine (ALPHAGAN) 0.2 % ophthalmic solution Place 1 drop into both eyes 2 (two) times daily.     diclofenac Sodium (VOLTAREN) 1 % GEL Apply 2 g topically 2 (two) times daily as needed (back  pain).     gabapentin (NEURONTIN) 600 MG tablet Take 600 mg by mouth at bedtime.      isosorbide mononitrate (IMDUR) 60 MG 24 hr tablet Take 1 tablet (60 mg total) by mouth daily.  90 tablet 1   latanoprost (XALATAN) 0.005 % ophthalmic solution Place 1 drop into both eyes at bedtime.     loratadine (CLARITIN) 10 MG tablet Take 10 mg by mouth daily as needed for allergies.     losartan-hydrochlorothiazide (HYZAAR) 100-25 MG tablet Take 1 tablet by mouth daily.     nitroGLYCERIN (NITROSTAT) 0.4 MG SL tablet Place 1 tablet (0.4 mg total) under the tongue every 5 (five) minutes as needed for chest pain (max 3 doses). 25 tablet 3   omeprazole (PRILOSEC) 20 MG capsule Take 20 mg by mouth in the morning and at bedtime.     pravastatin (PRAVACHOL) 40 MG tablet Take 1 tablet (40 mg total) by mouth every evening. 90 tablet 3   predniSONE (DELTASONE) 5 MG tablet Take 5 mg by mouth daily. While taking zytiga     RESTASIS 0.05 % ophthalmic emulsion Place 1 drop into both eyes 2 (two) times daily.     traMADol (ULTRAM) 50 MG tablet TAKE 1 TABLET(50 MG) BY MOUTH EVERY 8 HOURS AS NEEDED FOR SEVERE PAIN 30 tablet 0   No current facility-administered medications for this visit.    REVIEW OF SYSTEMS:  '[X]'$  denotes positive finding, '[ ]'$  denotes negative finding Cardiac  Comments:  Chest pain or chest pressure:    Shortness of breath upon exertion:    Short of breath when lying flat:    Irregular heart rhythm:        Vascular    Pain in calf, thigh, or hip brought on by ambulation: x   Pain in feet at night that wakes you up from your sleep:     Blood clot in your veins:    Leg swelling:  x       Pulmonary    Oxygen at home:    Productive cough:     Wheezing:         Neurologic    Sudden weakness in arms or legs:     Sudden numbness in arms or legs:     Sudden onset of difficulty speaking or slurred speech:    Temporary loss of vision in one eye:     Problems with dizziness:          Gastrointestinal    Blood in stool:     Vomited blood:         Genitourinary    Burning when urinating:     Blood in urine:        Psychiatric    Major depression:         Hematologic    Bleeding problems:    Problems with blood clotting too easily:        Skin    Rashes or ulcers:        Constitutional    Fever or chills:    -  PHYSICAL EXAM:   Vitals:   12/10/20 1343  BP: 130/65  Pulse: (!) 54  Resp: 18  Temp: 98.1 F (36.7 C)  TempSrc: Temporal  SpO2: 97%  Weight: 228 lb (103.4 kg)  Height: '5\' 9"'$  (1.753 m)   Body mass index is 33.67 kg/m. GENERAL: The patient is a well-nourished male, in no acute distress. The vital signs are documented above. CARDIAC: There is a regular rate and rhythm.  VASCULAR: I do not detect carotid bruits. He has palpable femoral pulses. I cannot palpate pedal pulses. He has a brisk right posterior tibial signal with the Doppler and a brisk left dorsalis  pedis and posterior tibial signal with the Doppler. He has no significant lower extremity swelling. PULMONARY: There is good air exchange bilaterally without wheezing or rales. ABDOMEN: Soft and non-tender with normal pitched bowel sounds.  MUSCULOSKELETAL: There are no major deformities. NEUROLOGIC: No focal weakness or paresthesias are detected. SKIN: There are no ulcers or rashes noted. PSYCHIATRIC: The patient has a normal affect.  DATA:    LABS: I reviewed his labs from 08/27/2020.  His GFR was greater than 60.  I have the report of the PET scan which is noted above.  I do not have the actual images.    CT ANGIO ABDOMEN PELVIS: I did review his previous CT angiogram of the abdomen and pelvis that was done back in December 2021.  This showed that his endovascular stent graft was in good position although there was a slight aneurysmal component just above the stent below the renal arteries.  The right iliac limb appeared to completely exclude the right iliac artery  aneurysm.  Deitra Mayo [facility]

## 2020-12-11 ENCOUNTER — Other Ambulatory Visit: Payer: Self-pay

## 2020-12-11 ENCOUNTER — Ambulatory Visit (INDEPENDENT_AMBULATORY_CARE_PROVIDER_SITE_OTHER): Payer: Medicare HMO | Admitting: Family Medicine

## 2020-12-11 ENCOUNTER — Encounter: Payer: Self-pay | Admitting: Family Medicine

## 2020-12-11 VITALS — BP 122/70 | HR 58 | Temp 98.1°F | Wt 231.4 lb

## 2020-12-11 DIAGNOSIS — R5383 Other fatigue: Secondary | ICD-10-CM | POA: Diagnosis not present

## 2020-12-11 DIAGNOSIS — R7303 Prediabetes: Secondary | ICD-10-CM | POA: Diagnosis not present

## 2020-12-11 LAB — GLUCOSE, POCT (MANUAL RESULT ENTRY): POC Glucose: 158 mg/dl — AB (ref 70–99)

## 2020-12-11 LAB — POC INFLUENZA A&B (BINAX/QUICKVUE)
Influenza A, POC: NEGATIVE
Influenza B, POC: NEGATIVE

## 2020-12-11 LAB — POC COVID19 BINAXNOW: SARS Coronavirus 2 Ag: NEGATIVE

## 2020-12-11 NOTE — Progress Notes (Signed)
   Subjective:    [name], male    DOB: [birth year], 80 y.o.   MRN: [id number]  HPI Chief Complaint  Patient presents with   other    Tired all the time past two days falls asleep during the day. Wakes up twice a night to go to the bathroom. Pt. Will take a flu shot.   He is here with complaints of fatigue for the past 2 days. He also reports having a mild intermittent headache.  States the roof of his mouth was itching 2 days ago so he took Claritin. The itching has resolved.  Denies any other concerns or symptoms.  Reports eating, drinking, sleeping ok.  He drove himself here today.   Denies fever, chills, dizziness, chest pain, palpitations, shortness of breath, abdominal pain, N/V/D, urinary symptoms, LE edema.   He is fully vaccinated against Covid including 2 boosters.   Reviewed allergies, medications, past medical, surgical, family, and social history.   Review of Systems Pertinent positives and negatives in the history of present illness.     Objective:   Physical Exam Constitutional:      General: He is not in acute distress.    Appearance: Normal appearance.  Eyes:     Conjunctiva/sclera: Conjunctivae normal.  Cardiovascular:     Rate and Rhythm: Normal rate and regular rhythm.     Pulses: Normal pulses.     Heart sounds: Normal heart sounds.  Pulmonary:     Effort: Pulmonary effort is normal.     Breath sounds: Normal breath sounds.  Musculoskeletal:        General: Normal range of motion.     Cervical back: Normal range of motion and neck supple.     Right lower leg: No edema.     Left lower leg: No edema.  Lymphadenopathy:     Cervical: No cervical adenopathy.  Skin:    General: Skin is warm and dry.  Neurological:     General: No focal deficit present.     Mental Status: He is alert and oriented to person, place, and time.     Motor: No weakness.     Coordination: Coordination normal.     Gait: Gait normal.  Psychiatric:         Mood and Affect: Mood normal.        Behavior: Behavior normal.        Thought Content: Thought content normal.   BP 122/70   Pulse (!) 58   Temp 98.1 F (36.7 C)   Wt 231 lb 6.4 oz (105 kg)   BMI 34.17 kg/m       Assessment & Plan:  Fatigue, unspecified type - Plan: Novel Coronavirus, NAA ([facility]), CBC with Differential/Platelet, Comprehensive metabolic panel, POCT glucose (manual entry), POC Influenza A&B(BINAX/QUICKVUE), POC COVID-19 BinaxNow  Prediabetes  Random glucose 158 Rapid flu- negative  Rapid Covid- negative  PCR Covid pending CBC, CMP pending   He is not in any acute distress. No red flag symptoms. Discussed that he may be developing a viral illness. Hold off on flu vaccine today.  He will let us know if he has any new or worsening symptoms. Follow up pending labs and PCR

## 2020-12-12 ENCOUNTER — Other Ambulatory Visit: Payer: Self-pay

## 2020-12-12 DIAGNOSIS — I739 Peripheral vascular disease, unspecified: Secondary | ICD-10-CM

## 2020-12-12 LAB — CBC WITH DIFFERENTIAL/PLATELET
Basophils Absolute: 0 10*3/uL (ref 0.0–0.2)
Basos: 1 %
EOS (ABSOLUTE): 0.3 10*3/uL (ref 0.0–0.4)
Eos: 6 %
Hematocrit: 30.8 % — ABNORMAL LOW (ref 37.5–51.0)
Hemoglobin: 9.6 g/dL — ABNORMAL LOW (ref 13.0–17.7)
Immature Grans (Abs): 0.1 10*3/uL (ref 0.0–0.1)
Immature Granulocytes: 1 %
Lymphocytes Absolute: 0.8 10*3/uL (ref 0.7–3.1)
Lymphs: 16 %
MCH: 21.1 pg — ABNORMAL LOW (ref 26.6–33.0)
MCHC: 31.2 g/dL — ABNORMAL LOW (ref 31.5–35.7)
MCV: 68 fL — ABNORMAL LOW (ref 79–97)
Monocytes Absolute: 0.4 10*3/uL (ref 0.1–0.9)
Monocytes: 8 %
Neutrophils Absolute: 3.3 10*3/uL (ref 1.4–7.0)
Neutrophils: 68 %
Platelets: 214 10*3/uL (ref 150–450)
RBC: 4.55 x10E6/uL (ref 4.14–5.80)
RDW: 15.7 % — ABNORMAL HIGH (ref 11.6–15.4)
WBC: 4.8 10*3/uL (ref 3.4–10.8)

## 2020-12-12 LAB — COMPREHENSIVE METABOLIC PANEL
ALT: 8 IU/L (ref 0–44)
AST: 14 IU/L (ref 0–40)
Albumin/Globulin Ratio: 1.5 (ref 1.2–2.2)
Albumin: 3.8 g/dL (ref 3.6–4.6)
Alkaline Phosphatase: 101 IU/L (ref 44–121)
BUN/Creatinine Ratio: 10 (ref 10–24)
BUN: 12 mg/dL (ref 8–27)
Bilirubin Total: <0.2 mg/dL (ref 0.0–1.2)
CO2: 23 mmol/L (ref 20–29)
Calcium: 9 mg/dL (ref 8.6–10.2)
Chloride: 102 mmol/L (ref 96–106)
Creatinine, Ser: 1.17 mg/dL (ref 0.76–1.27)
Globulin, Total: 2.5 g/dL (ref 1.5–4.5)
Glucose: 134 mg/dL — ABNORMAL HIGH (ref 65–99)
Potassium: 3.8 mmol/L (ref 3.5–5.2)
Sodium: 138 mmol/L (ref 134–144)
Total Protein: 6.3 g/dL (ref 6.0–8.5)
eGFR: 63 mL/min/{1.73_m2} (ref >59)

## 2020-12-12 LAB — SARS-COV-2, NAA 2 DAY TAT

## 2020-12-12 LAB — NOVEL CORONAVIRUS, NAA: SARS-CoV-2, NAA: NOT DETECTED

## 2020-12-12 NOTE — Progress Notes (Signed)
His labs are stable, nothing to explain his symptoms. We are still waiting on the PCR Covid test and will let him know when this is back. How is he feeling today?

## 2020-12-15 NOTE — Progress Notes (Signed)
His Covid PCR test is also negative.

## 2020-12-16 ENCOUNTER — Other Ambulatory Visit: Payer: Self-pay

## 2020-12-16 MED ORDER — AMLODIPINE BESYLATE 10 MG PO TABS: 10.0000 mg | ORAL_TABLET | Freq: Every day | ORAL | 2 refills | Status: DC

## 2020-12-22 DIAGNOSIS — I119 Hypertensive heart disease without heart failure: Secondary | ICD-10-CM | POA: Insufficient documentation

## 2020-12-22 DIAGNOSIS — D563 Thalassemia minor: Secondary | ICD-10-CM | POA: Insufficient documentation

## 2020-12-22 DIAGNOSIS — Z77118 Contact with and (suspected) exposure to other environmental pollution: Secondary | ICD-10-CM | POA: Insufficient documentation

## 2020-12-23 ENCOUNTER — Other Ambulatory Visit: Payer: Self-pay

## 2020-12-23 ENCOUNTER — Encounter: Payer: Self-pay | Admitting: Family Medicine

## 2020-12-23 ENCOUNTER — Ambulatory Visit (INDEPENDENT_AMBULATORY_CARE_PROVIDER_SITE_OTHER): Payer: Medicare HMO | Admitting: Family Medicine

## 2020-12-23 VITALS — BP 124/58 | HR 67 | Temp 97.7°F | Ht 69.0 in | Wt 228.2 lb

## 2020-12-23 DIAGNOSIS — T50905A Adverse effect of unspecified drugs, medicaments and biological substances, initial encounter: Secondary | ICD-10-CM

## 2020-12-23 DIAGNOSIS — R5383 Other fatigue: Secondary | ICD-10-CM

## 2020-12-23 NOTE — Progress Notes (Signed)
Subjective:    [name], male    DOB: Jan 19, 1941, 80 y.o.   MRN: [id number]  HPI Chief Complaint  Patient presents with   Fatigue    +weakness   Here with complaints of fatigue for the past 2 weeks.  States he started using a new pain patch 3 weeks ago. Taking buprenorphine 5 mcg/hour prescribed by his spine specialist. States he has severe fatigue and drowsiness the first couple of days after using a new patch and then symptoms improve until he applies a new patch.  He applied the new patch on Sunday, 2 days ago, removed it yesterday due to feeling oversedated. Does not currently have on a patch.   He did not contact his pain specialist who prescribed the medication, he came here today.   Denies fever, chills, dizziness, headache, chest pain, palpitations, shortness of breath, abdominal pain, N/V/D, urinary symptoms, LE edema.  No numbness, tingling or focal weakness.   Reviewed allergies, medications, past medical, surgical, family, and social history.   Review of Systems Pertinent positives and negatives in the history of present illness.     Objective:   Physical Exam Constitutional:      General: He is not in acute distress.    Appearance: Normal appearance. He is not ill-appearing.  Eyes:     Extraocular Movements: Extraocular movements intact.     Conjunctiva/sclera: Conjunctivae normal.     Pupils: Pupils are equal, round, and reactive to light.  Cardiovascular:     Rate and Rhythm: Normal rate and regular rhythm.     Pulses: Normal pulses.  Pulmonary:     Effort: Pulmonary effort is normal.     Breath sounds: Normal breath sounds.  Musculoskeletal:        General: Normal range of motion.     Cervical back: Normal range of motion and neck supple.  Skin:    General: Skin is warm and dry.     Capillary Refill: Capillary refill takes less than 2 seconds.     Coloration: Skin is not pale.  Neurological:     General: No focal deficit present.      Mental Status: He is alert and oriented to person, place, and time. Mental status is at baseline.     Cranial Nerves: No cranial nerve deficit.     Sensory: No sensory deficit.     Motor: No weakness.     Coordination: Coordination normal.   BP (!) 124/58 (BP Location: Left Arm, Patient Position: Sitting, Cuff Size: Normal)   Pulse 67   Temp 97.7 F (36.5 C) (Oral)   Ht '5\' 9"'$  (1.753 m)   Wt 228 lb 3.2 oz (103.5 kg)   SpO2 97%   BMI 33.70 kg/m       Assessment & Plan:  Adverse effect of drug, initial encounter  Other fatigue  Suspect patient is being overmedicated with the pain medication buprenorphine since his fatigue and drowsiness is worse the first couple of days after applying a new patch. Exam is unremarkable and he was able to ambulate with me around the office without significant change in pulse or oxygen saturation. Gait at baseline, he uses a cane.  I asked him to call his pain management provider from my office which he did and spoke to someone regarding his medication and the sedation he is experiencing. He will not apply a new patch and will follow up with their office. He may go back to taking Tylenol  for pain as he was prior to the new prescription. I asked the patient to call my office when he made it safely to his home after the visit and he did so. Follow up with me if he has any new or worsening concerns.

## 2020-12-31 DIAGNOSIS — D630 Anemia in neoplastic disease: Secondary | ICD-10-CM | POA: Diagnosis not present

## 2020-12-31 DIAGNOSIS — C884 Extranodal marginal zone B-cell lymphoma of mucosa-associated lymphoid tissue [MALT-lymphoma]: Secondary | ICD-10-CM | POA: Diagnosis not present

## 2020-12-31 DIAGNOSIS — Z95828 Presence of other vascular implants and grafts: Secondary | ICD-10-CM | POA: Diagnosis not present

## 2020-12-31 DIAGNOSIS — Z5111 Encounter for antineoplastic chemotherapy: Secondary | ICD-10-CM | POA: Diagnosis not present

## 2021-01-01 DIAGNOSIS — K118 Other diseases of salivary glands: Secondary | ICD-10-CM | POA: Diagnosis not present

## 2021-01-01 DIAGNOSIS — Z8572 Personal history of non-Hodgkin lymphomas: Secondary | ICD-10-CM | POA: Diagnosis not present

## 2021-01-01 DIAGNOSIS — Z87891 Personal history of nicotine dependence: Secondary | ICD-10-CM | POA: Diagnosis not present

## 2021-01-02 ENCOUNTER — Other Ambulatory Visit: Payer: Medicare HMO

## 2021-01-08 ENCOUNTER — Ambulatory Visit (HOSPITAL_COMMUNITY)
Admission: RE | Admit: 2021-01-08 | Discharge: 2021-01-08 | Disposition: A | Discharge status: Home / Self Care | Payer: Medicare HMO | Source: Ambulatory Visit | Attending: Vascular Surgery | Admitting: Vascular Surgery

## 2021-01-08 ENCOUNTER — Encounter: Payer: Self-pay | Admitting: Vascular Surgery

## 2021-01-08 ENCOUNTER — Other Ambulatory Visit: Payer: Self-pay

## 2021-01-08 ENCOUNTER — Ambulatory Visit (INDEPENDENT_AMBULATORY_CARE_PROVIDER_SITE_OTHER)
Admission: RE | Admit: 2021-01-08 | Discharge: 2021-01-08 | Disposition: A | Discharge status: Home / Self Care | Payer: Medicare HMO | Source: Ambulatory Visit | Attending: Vascular Surgery | Admitting: Vascular Surgery

## 2021-01-08 ENCOUNTER — Ambulatory Visit (INDEPENDENT_AMBULATORY_CARE_PROVIDER_SITE_OTHER): Payer: Medicare HMO | Admitting: Vascular Surgery

## 2021-01-08 VITALS — BP 113/67 | HR 66 | Temp 98.0°F | Resp 20 | Ht 69.0 in | Wt 224.8 lb

## 2021-01-08 DIAGNOSIS — I739 Peripheral vascular disease, unspecified: Secondary | ICD-10-CM | POA: Insufficient documentation

## 2021-01-08 DIAGNOSIS — I714 Abdominal aortic aneurysm, without rupture, unspecified: Secondary | ICD-10-CM

## 2021-01-08 NOTE — Progress Notes (Signed)
Patient name: Joel [name] MRN: [id number] DOB: [birth year] Sex: male  REASON FOR VISIT:   Follow-up of left popliteal to posterior tibial bypass  HPI:   Joel [name] is a pleasant 80 y.o. male who is undergone an endovascular aneurysm repair elsewhere.  He is also undergone a previous left below-knee popliteal to posterior tibial artery bypass graft in June 2011.  He comes in for a graft duplex and ABIs.  Of note, I recently saw him on 12/10/2020.  He had a PET scan which suggested an abnormality possibly related to his aortic graft.  I recommended a CT angiogram to evaluate his stent graft.  I reviewed his CT scan that was done in December 2021.  This showed that his endovascular stent graft was in good position with some slight aneurysmal dilatation just above the stent and below the renal arteries.   Today the patient denies any claudication, rest pain, or nonhealing ulcers.  He has no significant abdominal pain.  He has had some chronic back pain and has had back surgery.  Current Outpatient Medications  Medication Sig Dispense Refill   abiraterone acetate (ZYTIGA) 250 MG tablet Take 1,000 mg by mouth daily.      Accu-Chek FastClix Lancets MISC Test once daily 102 each 12   acetaminophen (TYLENOL) 500 MG tablet Take 1,000 mg by mouth every 6 (six) hours as needed for mild pain or headache.     albuterol (VENTOLIN HFA) 108 (90 Base) MCG/ACT inhaler INHALE 2 PUFFS INTO THE LUNGS EVERY 6 HOURS AS NEEDED FOR WHEEZING OR SHORTNESS OF BREATH 54 g 0   amLODipine (NORVASC) 10 MG tablet Take 1 tablet (10 mg total) by mouth daily. 90 tablet 2   aspirin EC 81 MG tablet Take 81 mg by mouth daily.      brimonidine (ALPHAGAN) 0.2 % ophthalmic solution Place 1 drop into both eyes 2 (two) times daily.     diclofenac Sodium (VOLTAREN) 1 % GEL Apply 2 g topically 2 (two) times daily as needed (back pain).     gabapentin (NEURONTIN) 600 MG tablet Take 600 mg by mouth at bedtime.      isosorbide  mononitrate (IMDUR) 60 MG 24 hr tablet Take 1 tablet (60 mg total) by mouth daily. 90 tablet 1   latanoprost (XALATAN) 0.005 % ophthalmic solution Place 1 drop into both eyes at bedtime.     loratadine (CLARITIN) 10 MG tablet Take 10 mg by mouth daily as needed for allergies.     losartan-hydrochlorothiazide (HYZAAR) 100-25 MG tablet Take 1 tablet by mouth daily.     nitroGLYCERIN (NITROSTAT) 0.4 MG SL tablet Place 1 tablet (0.4 mg total) under the tongue every 5 (five) minutes as needed for chest pain (max 3 doses). 25 tablet 3   omeprazole (PRILOSEC) 20 MG capsule Take 20 mg by mouth in the morning and at bedtime.     pravastatin (PRAVACHOL) 40 MG tablet Take 1 tablet (40 mg total) by mouth every evening. 90 tablet 3   predniSONE (DELTASONE) 5 MG tablet Take 5 mg by mouth daily. While taking zytiga     RESTASIS 0.05 % ophthalmic emulsion Place 1 drop into both eyes 2 (two) times daily.     traMADol (ULTRAM) 50 MG tablet TAKE 1 TABLET(50 MG) BY MOUTH EVERY 8 HOURS AS NEEDED FOR SEVERE PAIN 30 tablet 0   No current facility-administered medications for this visit.    REVIEW OF SYSTEMS:  [X]  denotes positive finding, [ ]   denotes negative finding Vascular    Leg swelling    Cardiac    Chest pain or chest pressure:    Shortness of breath upon exertion:    Short of breath when lying flat:    Irregular heart rhythm:    Constitutional    Fever or chills:     PHYSICAL EXAM:   Vitals:   01/08/21 1401  BP: 113/67  Pulse: 66  Resp: 20  Temp: 98 F (36.7 C)  SpO2: 95%  Weight: 224 lb 12.8 oz (102 kg)  Height: 5\' 9"  (1.753 m)    GENERAL: The patient is a well-nourished male, in no acute distress. The vital signs are documented above. CARDIOVASCULAR: There is a regular rate and rhythm. PULMONARY: There is good air exchange bilaterally without wheezing or rales. VASCULAR: I do not detect carotid bruits. He has palpable femoral pulses bilaterally. Both feet are warm and  well-perfused. He has no significant lower extremity swelling.  DATA:   GRAFT DUPLEX: I have independently interpreted his graft duplex scan today.  The bypass graft is widely patent with some monophasic flow proximally and a peak systolic velocity of 32 cm/s.  There is some stenosis in the mid to distal superficial femoral artery suggesting a greater than 50% stenosis.  ARTERIAL DOPPLER STUDY: I have independently interpreted his arterial Doppler study today.  On the right side there is a biphasic dorsalis pedis and posterior tibial signal with an ABI of 60%.  Toe pressure 67 mmHg.  On the left side there is a triphasic posterior tibial signal with a biphasic dorsalis pedis signal.  ABIs 98% and toe pressure is 91 mmHg.   MEDICAL ISSUES:   PERIPHERAL ARTERIAL DISEASE: This patient underwent a left popliteal to posterior tibial artery bypass in 2011.  His bypass graft is patent and he is asymptomatic.  He does have some low flow velocities and some proximal disease in his superficial femoral artery.  However on the left side he has a triphasic signal in the posterior tibial artery with an ABI of 98%.  I think his only option for addressing any proximal disease would be exposure of the common femoral artery and stenting of the superficial femoral artery.  Given that he has an endovascular graft this would have to be done in the operating room via an open approach.  He is 80 years old and I think we can simply follow this.  When he comes back in December after his had a CT angio of the abdomen pelvis we can get him lined up for continued follow-up of his bypass graft in the left leg.  We will probably check it again in 6 months and then he can have a CT angiogram yearly along with follow-up of his bypass graft.  Deitra Mayo [facility] [place] [phone]

## 2021-01-12 DIAGNOSIS — M5116 Intervertebral disc disorders with radiculopathy, lumbar region: Secondary | ICD-10-CM | POA: Diagnosis not present

## 2021-01-12 DIAGNOSIS — M4326 Fusion of spine, lumbar region: Secondary | ICD-10-CM | POA: Diagnosis not present

## 2021-01-15 NOTE — Progress Notes (Signed)
   Subjective:    [name], male    DOB: Mar 03, 1941, 80 y.o.   MRN: [id number]  HPI Chief Complaint  Patient presents with   Fatigue    One month follow up,has improved since d/c'd pain patch   Nasal Congestion    Started last week, started some OTC nose spray and is slowly improving, reports no other S/S   He is here for medication management visit but also complains of a 6 day history of nasal congestion and cough. Denies fever, chills, dizziness, chest pain, palpitations, shortness of breath, abdominal pain, N/V/D, urinary symptoms, LE edema.   COPD-states he has not needed his albuterol inhaler in several weeks to months.  Anemia-states he saw GI at the [place] last week and will be following up with them regarding anemia.  Denies any visible blood loss including melena.   Diabetes- last Hgb A1c 6.6%  Hypertension-reports taking medications daily without any concerns  HL and aortic atherosclerosis-taking pravastatin daily   Review of Systems Pertinent positives and negatives in the history of present illness.     Objective:   Physical Exam BP 122/62 (BP Location: Left Arm, Patient Position: Sitting)   Pulse 66   Ht 5\' 9"  (1.753 m)   Wt 227 lb 6.4 oz (103.1 kg)   SpO2 95%   BMI 33.58 kg/m   Alert and oriented in no acute distress.  Respirations unlabored.  Speaking in complete sentences without difficulty.      Assessment & Plan:  Cough, unspecified type - Plan: POC COVID-19, Novel Coronavirus, NAA ([facility]) -Rapid COVID test is negative.  PCR pending.  He will use Mucinex, Claritin and albuterol as needed.  Follow-up if worsening  Iron deficiency anemia, unspecified iron deficiency anemia type - Plan: CBC with Differential/Platelet, Comprehensive metabolic panel, Iron, TIBC and Ferritin Panel -He will follow-up with GI at the [facility]  Controlled type 2 diabetes mellitus with complication, without long-term current use of insulin (HCC) - Plan:  Comprehensive metabolic panel, Hemoglobin A1c  Essential hypertension - Plan: CBC with Differential/Platelet, Comprehensive metabolic panel -Blood pressure controlled.  Continue current medications  Atherosclerosis of native artery of right lower extremity with intermittent claudication (HCC) -Continue statin therapy  Pure hypercholesterolemia -Continue statin therapy  Chronic obstructive pulmonary disease, unspecified COPD type ([place]) -Controlled and no recent flareups.  He has not needed albuterol in several weeks to months  Needs flu shot - Plan: Flu Vaccine QUAD High Dose(Fluad)  Aortic atherosclerosis (HCC) -Continue statin therapy.  Followed by cardiology.  Personal history of prostate cancer -Follow-up with urology as recommended

## 2021-01-16 ENCOUNTER — Other Ambulatory Visit: Payer: Self-pay

## 2021-01-16 ENCOUNTER — Encounter: Payer: Self-pay | Admitting: Family Medicine

## 2021-01-16 ENCOUNTER — Ambulatory Visit (INDEPENDENT_AMBULATORY_CARE_PROVIDER_SITE_OTHER): Payer: Medicare HMO | Admitting: Family Medicine

## 2021-01-16 VITALS — BP 122/62 | HR 66 | Ht 69.0 in | Wt 227.4 lb

## 2021-01-16 DIAGNOSIS — I70211 Atherosclerosis of native arteries of extremities with intermittent claudication, right leg: Secondary | ICD-10-CM | POA: Diagnosis not present

## 2021-01-16 DIAGNOSIS — E118 Type 2 diabetes mellitus with unspecified complications: Secondary | ICD-10-CM | POA: Diagnosis not present

## 2021-01-16 DIAGNOSIS — Z23 Encounter for immunization: Secondary | ICD-10-CM

## 2021-01-16 DIAGNOSIS — R059 Cough, unspecified: Secondary | ICD-10-CM

## 2021-01-16 DIAGNOSIS — E7800 Pure hypercholesterolemia, unspecified: Secondary | ICD-10-CM

## 2021-01-16 DIAGNOSIS — I10 Essential (primary) hypertension: Secondary | ICD-10-CM | POA: Diagnosis not present

## 2021-01-16 DIAGNOSIS — J449 Chronic obstructive pulmonary disease, unspecified: Secondary | ICD-10-CM | POA: Diagnosis not present

## 2021-01-16 DIAGNOSIS — I700 Atherosclerosis of aorta: Secondary | ICD-10-CM | POA: Diagnosis not present

## 2021-01-16 DIAGNOSIS — Z8546 Personal history of malignant neoplasm of prostate: Secondary | ICD-10-CM

## 2021-01-16 DIAGNOSIS — D509 Iron deficiency anemia, unspecified: Secondary | ICD-10-CM | POA: Diagnosis not present

## 2021-01-16 DIAGNOSIS — R7303 Prediabetes: Secondary | ICD-10-CM

## 2021-01-16 LAB — POC COVID19 BINAXNOW: SARS Coronavirus 2 Ag: NEGATIVE

## 2021-01-16 NOTE — Patient Instructions (Signed)
Ask your GI at the [facility] to address your anemia. Or, you can see your hematologist/oncologist but this needs to be addressed.

## 2021-01-17 LAB — COMPREHENSIVE METABOLIC PANEL
ALT: 11 IU/L (ref 0–44)
AST: 15 IU/L (ref 0–40)
Albumin/Globulin Ratio: 2.1 (ref 1.2–2.2)
Albumin: 4.2 g/dL (ref 3.6–4.6)
Alkaline Phosphatase: 101 IU/L (ref 44–121)
BUN/Creatinine Ratio: 15 (ref 10–24)
BUN: 14 mg/dL (ref 8–27)
Bilirubin Total: 0.2 mg/dL (ref 0.0–1.2)
CO2: 22 mmol/L (ref 20–29)
Calcium: 9.1 mg/dL (ref 8.6–10.2)
Chloride: 105 mmol/L (ref 96–106)
Creatinine, Ser: 0.95 mg/dL (ref 0.76–1.27)
Globulin, Total: 2 g/dL (ref 1.5–4.5)
Glucose: 121 mg/dL — ABNORMAL HIGH (ref 70–99)
Potassium: 4.3 mmol/L (ref 3.5–5.2)
Sodium: 142 mmol/L (ref 134–144)
Total Protein: 6.2 g/dL (ref 6.0–8.5)
eGFR: 80 mL/min/{1.73_m2} (ref >59)

## 2021-01-17 LAB — CBC WITH DIFFERENTIAL/PLATELET
Basophils Absolute: 0.1 10*3/uL (ref 0.0–0.2)
Basos: 1 %
EOS (ABSOLUTE): 0.3 10*3/uL (ref 0.0–0.4)
Eos: 5 %
Hematocrit: 35.6 % — ABNORMAL LOW (ref 37.5–51.0)
Hemoglobin: 10.8 g/dL — ABNORMAL LOW (ref 13.0–17.7)
Immature Grans (Abs): 0.1 10*3/uL (ref 0.0–0.1)
Immature Granulocytes: 1 %
Lymphocytes Absolute: 0.7 10*3/uL (ref 0.7–3.1)
Lymphs: 12 %
MCH: 20.5 pg — ABNORMAL LOW (ref 26.6–33.0)
MCHC: 30.3 g/dL — ABNORMAL LOW (ref 31.5–35.7)
MCV: 68 fL — ABNORMAL LOW (ref 79–97)
Monocytes Absolute: 0.4 10*3/uL (ref 0.1–0.9)
Monocytes: 6 %
Neutrophils Absolute: 4.6 10*3/uL (ref 1.4–7.0)
Neutrophils: 75 %
Platelets: 177 10*3/uL (ref 150–450)
RBC: 5.26 x10E6/uL (ref 4.14–5.80)
RDW: 17.8 % — ABNORMAL HIGH (ref 11.6–15.4)
WBC: 6.1 10*3/uL (ref 3.4–10.8)

## 2021-01-17 LAB — IRON,TIBC AND FERRITIN PANEL
Ferritin: 712 ng/mL — ABNORMAL HIGH (ref 30–400)
Iron Saturation: 21 % (ref 15–55)
Iron: 45 ug/dL (ref 38–169)
Total Iron Binding Capacity: 219 ug/dL — ABNORMAL LOW (ref 250–450)
UIBC: 174 ug/dL (ref 111–343)

## 2021-01-17 LAB — HEMOGLOBIN A1C
Est. average glucose Bld gHb Est-mCnc: 128 mg/dL
Hgb A1c MFr Bld: 6.1 % — ABNORMAL HIGH (ref 4.8–5.6)

## 2021-01-17 LAB — SARS-COV-2, NAA 2 DAY TAT

## 2021-01-17 LAB — NOVEL CORONAVIRUS, NAA: SARS-CoV-2, NAA: NOT DETECTED

## 2021-01-18 NOTE — Progress Notes (Signed)
His Covid test was negative. His diabetes is controlled and in prediabetes range. His anemia is stable and I do recommend he follow up with his GI at the [place] as we discussed.

## 2021-01-23 DIAGNOSIS — K118 Other diseases of salivary glands: Secondary | ICD-10-CM | POA: Diagnosis not present

## 2021-01-23 DIAGNOSIS — C884 Extranodal marginal zone B-cell lymphoma of mucosa-associated lymphoid tissue [MALT-lymphoma]: Secondary | ICD-10-CM | POA: Diagnosis not present

## 2021-01-26 DIAGNOSIS — H0102B Squamous blepharitis left eye, upper and lower eyelids: Secondary | ICD-10-CM | POA: Diagnosis not present

## 2021-01-26 DIAGNOSIS — H43811 Vitreous degeneration, right eye: Secondary | ICD-10-CM | POA: Diagnosis not present

## 2021-01-26 DIAGNOSIS — Z961 Presence of intraocular lens: Secondary | ICD-10-CM | POA: Diagnosis not present

## 2021-01-26 DIAGNOSIS — H16223 Keratoconjunctivitis sicca, not specified as Sjogren's, bilateral: Secondary | ICD-10-CM | POA: Diagnosis not present

## 2021-01-26 DIAGNOSIS — H5789 Other specified disorders of eye and adnexa: Secondary | ICD-10-CM | POA: Diagnosis not present

## 2021-01-26 DIAGNOSIS — C6982 Malignant neoplasm of overlapping sites of left eye and adnexa: Secondary | ICD-10-CM | POA: Diagnosis not present

## 2021-01-26 DIAGNOSIS — H0102A Squamous blepharitis right eye, upper and lower eyelids: Secondary | ICD-10-CM | POA: Diagnosis not present

## 2021-01-26 DIAGNOSIS — H401231 Low-tension glaucoma, bilateral, mild stage: Secondary | ICD-10-CM | POA: Diagnosis not present

## 2021-01-26 DIAGNOSIS — H35033 Hypertensive retinopathy, bilateral: Secondary | ICD-10-CM | POA: Diagnosis not present

## 2021-01-28 ENCOUNTER — Telehealth: Payer: Self-pay | Admitting: Family Medicine

## 2021-01-28 NOTE — Chronic Care Management (AMB) (Signed)
  Chronic Care Management   Note  01/28/2021 Name: Joel [name] MRN: [id number] DOB: [birth year]  Joel [name] is a 80 y.o. year old male who is a primary care patient of Girtha Rm, [place]. I reached out to Joel [name] by phone today in response to a referral sent by Joel [name] PCP, Henson, Vickie L, PA-C.   Joel [name] was given information about Chronic Care Management services today including:  CCM service includes personalized support from designated clinical staff supervised by his physician, including individualized plan of care and coordination with other care providers 24/7 contact phone numbers for assistance for urgent and routine care needs. Service will only be billed when office clinical staff spend 20 minutes or more in a month to coordinate care. Only one practitioner may furnish and bill the service in a calendar month. The patient may stop CCM services at any time (effective at the end of the month) by phone call to the office staff.   Patient agreed to services and verbal consent obtained.   Follow up plan:   Joel [name]

## 2021-02-04 ENCOUNTER — Telehealth: Payer: Self-pay | Admitting: Cardiovascular Disease

## 2021-02-04 ENCOUNTER — Telehealth: Payer: Self-pay

## 2021-02-04 DIAGNOSIS — G8929 Other chronic pain: Secondary | ICD-10-CM

## 2021-02-04 MED ORDER — TRAMADOL HCL 50 MG PO TABS: ORAL_TABLET | ORAL | 0 refills | Status: DC

## 2021-02-04 NOTE — Telephone Encounter (Signed)
LM advising pt to contact his PCP for a refill on Tramadol.

## 2021-02-04 NOTE — Telephone Encounter (Signed)
*  STAT* If patient is at the pharmacy, call can be transferred to refill team.   1. Which medications need to be refilled? (please list name of each medication and dose if known)  traMADol (ULTRAM) 50 MG tablet   2. Which pharmacy/location (including street and city if local pharmacy) is medication to be sent to? [facility] [phone] - [place], [address] AT [address]   3. Do they need a 30 day or 90 day supply? 30 with refills

## 2021-02-04 NOTE — Telephone Encounter (Signed)
Pt called and needs refill on his Tramadol for his shoulder and back pain, said doesn't use it that often but been hurting and needs refill.

## 2021-02-05 NOTE — Telephone Encounter (Signed)
Was refilled by Dr. Redmond School

## 2021-02-16 DIAGNOSIS — M4326 Fusion of spine, lumbar region: Secondary | ICD-10-CM | POA: Diagnosis not present

## 2021-02-16 DIAGNOSIS — M5116 Intervertebral disc disorders with radiculopathy, lumbar region: Secondary | ICD-10-CM | POA: Diagnosis not present

## 2021-02-23 ENCOUNTER — Ambulatory Visit: Payer: Medicare HMO

## 2021-02-24 ENCOUNTER — Other Ambulatory Visit: Payer: Self-pay

## 2021-02-24 ENCOUNTER — Ambulatory Visit (INDEPENDENT_AMBULATORY_CARE_PROVIDER_SITE_OTHER): Payer: Medicare HMO

## 2021-02-24 DIAGNOSIS — Z23 Encounter for immunization: Secondary | ICD-10-CM | POA: Diagnosis not present

## 2021-03-03 ENCOUNTER — Other Ambulatory Visit: Payer: Self-pay | Admitting: Vascular Surgery

## 2021-03-03 ENCOUNTER — Other Ambulatory Visit: Payer: Self-pay

## 2021-03-03 DIAGNOSIS — I739 Peripheral vascular disease, unspecified: Secondary | ICD-10-CM

## 2021-03-03 DIAGNOSIS — I714 Abdominal aortic aneurysm, without rupture, unspecified: Secondary | ICD-10-CM

## 2021-03-03 DIAGNOSIS — I713 Abdominal aortic aneurysm, ruptured, unspecified: Secondary | ICD-10-CM

## 2021-03-16 ENCOUNTER — Telehealth: Payer: Self-pay | Admitting: Pharmacist

## 2021-03-16 DIAGNOSIS — M5116 Intervertebral disc disorders with radiculopathy, lumbar region: Secondary | ICD-10-CM | POA: Diagnosis not present

## 2021-03-16 DIAGNOSIS — M4326 Fusion of spine, lumbar region: Secondary | ICD-10-CM | POA: Diagnosis not present

## 2021-03-16 NOTE — Progress Notes (Signed)
A user error has taken place: encounter opened in error, closed for administrative reasons.

## 2021-03-16 NOTE — Chronic Care Management (AMB) (Signed)
Per [name] Call to patient to push back initial appointment with Pharmacist until new PCP has started, attempted to reach patient left voicemail that appointment in Dec has been canceled because of the above and rescheduled. Left my contact information in the event that the newer appointment does not fit with patients schedule to call me and reschedule    [place] Pharmacist Assistant [phone]

## 2021-03-21 ENCOUNTER — Other Ambulatory Visit: Payer: Self-pay | Admitting: Family

## 2021-03-23 ENCOUNTER — Ambulatory Visit (HOSPITAL_COMMUNITY)
Admission: EM | Admit: 2021-03-23 | Discharge: 2021-03-23 | Disposition: A | Discharge status: Home / Self Care | Payer: Medicare HMO | Attending: Family Medicine | Admitting: Family Medicine

## 2021-03-23 ENCOUNTER — Other Ambulatory Visit: Payer: Self-pay

## 2021-03-23 DIAGNOSIS — R42 Dizziness and giddiness: Secondary | ICD-10-CM

## 2021-03-23 LAB — CBC
HCT: 37.8 % — ABNORMAL LOW (ref 39.0–52.0)
Hemoglobin: 11.4 g/dL — ABNORMAL LOW (ref 13.0–17.0)
MCH: 21.3 pg — ABNORMAL LOW (ref 26.0–34.0)
MCHC: 30.2 g/dL (ref 30.0–36.0)
MCV: 70.5 fL — ABNORMAL LOW (ref 80.0–100.0)
Platelets: 219 10*3/uL (ref 150–400)
RBC: 5.36 MIL/uL (ref 4.22–5.81)
RDW: 18.8 % — ABNORMAL HIGH (ref 11.5–15.5)
WBC: 6.1 10*3/uL (ref 4.0–10.5)
nRBC: 0 % (ref 0.0–0.2)

## 2021-03-23 LAB — BASIC METABOLIC PANEL
Anion gap: 7 (ref 5–15)
BUN: 13 mg/dL (ref 8–23)
CO2: 26 mmol/L (ref 22–32)
Calcium: 9.2 mg/dL (ref 8.9–10.3)
Chloride: 105 mmol/L (ref 98–111)
Creatinine, Ser: 1.05 mg/dL (ref 0.61–1.24)
GFR, Estimated: >60 mL/min (ref >60)
Glucose, Bld: 101 mg/dL — ABNORMAL HIGH (ref 70–99)
Potassium: 4.1 mmol/L (ref 3.5–5.1)
Sodium: 138 mmol/L (ref 135–145)

## 2021-03-23 NOTE — Discharge Instructions (Signed)
Your EKG did not show any arrhythmia (irregular heart beat) or other acute changes. Still if this happens any more, you may want to go to the ER for them to evaluate you while you are having the symptoms.   Our staff will call you with lab results if anything is abnormal.

## 2021-03-23 NOTE — ED Triage Notes (Signed)
Pt reports nausea, fatigue, and dizzy since Saturday that has been intermittent. Pt reports that legs feel like going to swell up on him.

## 2021-03-23 NOTE — ED Provider Notes (Addendum)
[place]    CSN: [id number] Arrival date & time: 03/23/21  1359      History   Chief Complaint Chief Complaint  Patient presents with   Fatigue   Nausea   Dizziness    HPI Joel [name] is a 80 y.o. male.    Dizziness Here for some intermittent symptoms. It began afternoon of 12/10, and he had some dizziness like lightheadedness, felt sort of weak and had heaviness in his legs then. Lasted an hour or two. Then 12/11 had similar, took a tramadol for some ?discomfort in chest, and rested and it went away. Then this AM, he had these symptoms for about 3 hours, and he felt tired and weak. His legs felt heavy. Maybe had a dull h/a.   No f/c/n/v/d. No cp or dyspnea. No abd pain. No dysuria. No edema.  Takes ASA daily. He does have a h/o cardiac stents, and is established with cardiology. Takes meds for bp and Zytiga and prednisone for prostate cancer. No new meds in the last 1-2 weeks.  When he is here at the [facility] this afternoon, he is having no symptoms   Past Medical History:  Diagnosis Date   Atypical chest pain    CAD (coronary artery disease)    a. Multiple prior evaluations then 05/2012 s/p BMS to OM2. b. Nuc 11/2012 wnl. c. Cath 2015: patent stent, moderate LAD/RCA dz, treated medically.   Cataract    CKD (chronic kidney disease), stage II    COPD (chronic obstructive pulmonary disease) (HCC)    Cough secondary to angiotensin converting enzyme inhibitor (ACE-I)    Diabetes mellitus without complication (HCC)    Diverticulosis    DVT (deep venous thrombosis) ([place])    Family history of breast cancer in first degree relative    GERD (gastroesophageal reflux disease)    Headache(784.0)    Hx: of   History of blood transfusion    "I've had 2; don't know what it was related to" (11/09/2012)   HLD (hyperlipidemia)    HTN (hypertension)    Iron deficiency anemia    Lymphoproliferative disorder, low grade B cell ([place]) 05/21/2020   Microcytic anemia     Peripheral vascular disease ([place])    a. L pop-tibial bypass 2011, followed by [facility].   Personal history of prostate cancer    Rotator cuff injury    "left arm; never repaired" (11/09/2012)    Patient Active Problem List   Diagnosis Date Noted   Aortic atherosclerosis ([place]) 07/17/2020   Lymphoproliferative disorder, low grade B cell (HCC)-Stage 4 05/21/2020   Chest pain 02/10/2020   Chest pain of uncertain etiology    Sleep related hypoxia 04/03/2019   Obesity (BMI 30-39.9) 04/03/2019   Snoring 04/03/2019   At risk for sleep apnea 01/10/2019   Excessive daytime sleepiness 01/10/2019   Chronic low back pain 09/11/2018   COPD (chronic obstructive pulmonary disease) (HCC)    Seasonal allergies 07/04/2018   Chronic obstructive pulmonary disease ([place]) 07/04/2018   Dizziness 01/20/2018   Malaise 01/20/2018   Decreased breath sounds 01/20/2018   Needs flu shot 01/20/2018   Chronic nonintractable headache 12/07/2017   Back pain 11/01/2017   Radicular pain of lower extremity 11/01/2017   Prostate cancer ([place]) 11/01/2017   Family history of breast cancer in first degree relative    Personal history of prostate cancer    Elliptocytosis on peripheral blood smear ([place]) 09/13/2016   Prediabetes 03/22/2016   PVD (peripheral vascular disease)  with claudication ([place]) 11/15/2012   Chest pain, localized 11/10/2012   Hypokalemia 11/10/2012   Bradycardia 11/09/2012   Thrombocytopenia ([place]) 06/01/2012   Microcytic anemia    Atherosclerosis of native artery of extremity with intermittent claudication ([place]) 06/16/2011   Stable angina ([place]) 06/11/2011   CAD (coronary artery disease)    Chest pain, atypical 05/30/2011   Iron deficiency anemia 10/03/2009   DIVERTICULOSIS OF COLON 10/03/2009   WEIGHT LOSS, ABNORMAL 10/03/2009   CHEST PAIN UNSPECIFIED 09/13/2008   Headache(784.0) 08/19/2008   TIA 06/05/2008   SHOULDER PAIN, LEFT 10/11/2007   COUGH DUE TO ACE INHIBITORS 05/07/2007   History of  tobacco use disorder 04/25/2007   Hyperlipidemia 04/21/2007   Essential hypertension 04/21/2007   GERD 04/21/2007    Past Surgical History:  Procedure Laterality Date   CARDIOVASCULAR STRESS TEST  Aug. 2014   CATARACT EXTRACTION W/ INTRAOCULAR LENS IMPLANT Right ~ 2008   CIRCUMCISION     COLONOSCOPY     CORONARY ANGIOPLASTY WITH STENT PLACEMENT  2014   "1" (11/09/2012)   ILIAC ARTERY ANEURYSM REPAIR     LEFT HEART CATH  05-31-12   LEFT HEART CATH AND CORONARY ANGIOGRAPHY N/A 08/01/2019   Procedure: LEFT HEART CATH AND CORONARY ANGIOGRAPHY;  Surgeon: Burnell Blanks, MD;  Location: [facility] CV LAB;  Service: Cardiovascular;  Laterality: N/A;   LEFT HEART CATHETERIZATION WITH CORONARY ANGIOGRAM N/A 06/01/2011   Procedure: LEFT HEART CATHETERIZATION WITH CORONARY ANGIOGRAM;  Surgeon: Hillary Bow, MD;  Location: [facility] CATH LAB;  Service: Cardiovascular;  Laterality: N/A;   LEFT HEART CATHETERIZATION WITH CORONARY ANGIOGRAM N/A 05/31/2012   Procedure: LEFT HEART CATHETERIZATION WITH CORONARY ANGIOGRAM;  Surgeon: Peter M Martinique, MD;  Location: [facility] CATH LAB;  Service: Cardiovascular;  Laterality: N/A;   LEFT HEART CATHETERIZATION WITH CORONARY ANGIOGRAM N/A 08/01/2013   Procedure: LEFT HEART CATHETERIZATION WITH CORONARY ANGIOGRAM;  Surgeon: Burnell Blanks, MD;  Location: [facility] CATH LAB;  Service: Cardiovascular;  Laterality: N/A;   PERCUTANEOUS CORONARY STENT INTERVENTION (PCI-S)  05/31/2012   Procedure: PERCUTANEOUS CORONARY STENT INTERVENTION (PCI-S);  Surgeon: Peter M Martinique, MD;  Location: [facility] CATH LAB;  Service: Cardiovascular;;   PR VEIN BYPASS GRAFT,AORTO-FEM-POP Left 10/01/2009   left below knee popliteal artery to posterior tibial artery   SHOULDER ARTHROSCOPY WITH OPEN ROTATOR CUFF REPAIR AND DISTAL CLAVICLE ACROMINECTOMY Left 01/12/2013   Procedure: LEFT SHOULDER ARTHROSCOPY WITH DEBRIDEMENT OPEN DISTAL CLAVICLE RESECTION ,acromioplastyAND ROTATOR CUFF REPAIR;  Surgeon: Yvette Rack., MD;  Location: [name];  Service: Orthopedics;  Laterality: Left;       Home Medications    Prior to Admission medications   Medication Sig Start Date End Date Taking? Authorizing Provider  abiraterone acetate (ZYTIGA) 250 MG tablet Take 1,000 mg by mouth daily.     [provider]  Accu-Chek FastClix Lancets MISC Test once daily 11/23/18   Raenette Rover, Vickie L, PA-C  acetaminophen (TYLENOL) 500 MG tablet Take 1,000 mg by mouth every 6 (six) hours as needed for mild pain or headache.    [provider]  albuterol (VENTOLIN HFA) 108 (90 Base) MCG/ACT inhaler INHALE 2 PUFFS INTO THE LUNGS EVERY 6 HOURS AS NEEDED FOR WHEEZING OR SHORTNESS OF BREATH 03/15/19   Henson, Vickie L, PA-C  amLODipine (NORVASC) 10 MG tablet Take 1 tablet (10 mg total) by mouth daily. 12/16/20   Burnell Blanks, MD  aspirin EC 81 MG tablet Take 81 mg by mouth daily.     [provider]  brimonidine (ALPHAGAN) 0.2 % ophthalmic solution Place 1 drop into both eyes 2 (two) times daily. 05/09/20   [provider]  diclofenac Sodium (VOLTAREN) 1 % GEL Apply 2 g topically 2 (two) times daily as needed (back pain). 06/16/20   [provider]  gabapentin (NEURONTIN) 600 MG tablet Take 600 mg by mouth at bedtime.     [provider]  isosorbide mononitrate (IMDUR) 60 MG 24 hr tablet TAKE 1 TABLET(60 MG) BY MOUTH DAILY 03/23/21   Burnell Blanks, MD  latanoprost (XALATAN) 0.005 % ophthalmic solution Place 1 drop into both eyes at bedtime. 02/28/20   [provider]  loratadine (CLARITIN) 10 MG tablet Take 10 mg by mouth daily as needed for allergies.    [provider]  losartan-hydrochlorothiazide (HYZAAR) 100-25 MG tablet Take 1 tablet by mouth daily.    [provider]  nitroGLYCERIN (NITROSTAT) 0.4 MG SL tablet Place 1 tablet (0.4 mg total) under the tongue every 5 (five) minutes as needed for chest pain (max 3 doses). 10/20/20    Burnell Blanks, MD  omeprazole (PRILOSEC) 20 MG capsule Take 20 mg by mouth in the morning and at bedtime.    [provider]  pravastatin (PRAVACHOL) 40 MG tablet Take 1 tablet (40 mg total) by mouth every evening. 06/06/20   Burnell Blanks, MD  predniSONE (DELTASONE) 5 MG tablet Take 5 mg by mouth daily. While taking zytiga 07/17/20   [provider]  RESTASIS 0.05 % ophthalmic emulsion Place 1 drop into both eyes 2 (two) times daily. 08/31/19   [provider]  traMADol (ULTRAM) 50 MG tablet TAKE 1 TABLET(50 MG) BY MOUTH EVERY 8 HOURS AS NEEDED FOR SEVERE PAIN 02/04/21   Denita Lung, MD    Family History Family History  Problem Relation Age of Onset   Breast cancer Mother        dx 85s   Hyperlipidemia Mother    Hypertension Mother    COPD Father        deceased   Hyperlipidemia Father    Hypertension Father    Hypertension Sister    Cancer Brother 54       Unknown cancer, possibly stomach   Hypertension Brother    Cancer Brother 62       Unknown, possibly lung   Hypertension Daughter    Hypertension Son    Colon cancer Neg Hx        pt is unclear about family hx    Social History Social History   Tobacco Use   Smoking status: Former    Packs/day: 0.50    Years: 52.00    Pack years: 26.00    Types: Cigarettes    Quit date: 05/26/2011    Years since quitting: 9.8   Smokeless tobacco: Never  Vaping Use   Vaping Use: Never used  Substance Use Topics   Alcohol use: No   Drug use: No     Allergies   Topiramate, Atenolol, Lisinopril, and Tramadol-acetaminophen   Review of Systems Review of Systems  Neurological:  Positive for dizziness.    Physical Exam Triage Vital Signs ED Triage Vitals  Enc Vitals Group     BP 03/23/21 1550 (!) 153/98     Pulse Rate 03/23/21 1550 (!) 53     Resp 03/23/21 1550 18     Temp 03/23/21 1550 98 F (36.7 C)     Temp Source 03/23/21 1550 Oral  SpO2 03/23/21 1550 97 %      Weight --      Height --      Head Circumference --      Peak Flow --      Pain Score 03/23/21 1549 0     Pain Loc --      Pain Edu? --      Excl. in [place]? --    No data found.  Updated Vital Signs BP (!) 153/98 (BP Location: Left Arm)   Pulse (!) 53   Temp 98 F (36.7 C) (Oral)   Resp 18   SpO2 97%   Visual Acuity Right Eye Distance:   Left Eye Distance:   Bilateral Distance:    Right Eye Near:   Left Eye Near:    Bilateral Near:     Physical Exam Vitals reviewed.  Constitutional:      General: He is not in acute distress.    Appearance: He is not toxic-appearing or diaphoretic.  HENT:     Right Ear: Tympanic membrane and ear canal normal.     Left Ear: Tympanic membrane and ear canal normal.     Mouth/Throat:     Mouth: Mucous membranes are moist.     Pharynx: No oropharyngeal exudate or posterior oropharyngeal erythema.  Eyes:     Extraocular Movements: Extraocular movements intact.     Conjunctiva/sclera: Conjunctivae normal.  Cardiovascular:     Rate and Rhythm: Normal rate.     Heart sounds: No murmur heard.    Comments: Rhythm a little irregular on exam, poss just extra beat or two Pulmonary:     Effort: Pulmonary effort is normal.     Breath sounds: Normal breath sounds.  Musculoskeletal:     Cervical back: Neck supple.     Right lower leg: No edema.     Left lower leg: No edema.  Lymphadenopathy:     Cervical: No cervical adenopathy.  Skin:    Coloration: Skin is not pale.  Neurological:     Mental Status: He is alert and oriented to person, place, and time.  Psychiatric:        Behavior: Behavior normal.     UC Treatments / Results  Labs (all labs ordered are listed, but only abnormal results are displayed) Labs Reviewed  CBC  BASIC METABOLIC PANEL    EKG   Radiology No results found.  Procedures Procedures (including critical care time)  Medications Ordered in UC Medications - No data to display  Initial Impression /  Assessment and Plan / UC Course  I have reviewed the triage vital signs and the nursing notes.  Pertinent labs & imaging results that were available during my care of the patient were reviewed by me and considered in my medical decision making (see chart for details).     EKG is essentially normal, with sinus rhythm. No ST elevation  or depression. Only V5 has an upsloping ST segment into his T wave  Pt will f/u with pcp and possibly with cardiologist about his symptoms. CBC and bmp to make sure no severe anemia or electrolyte abnormality. Final Clinical Impressions(s) / UC Diagnoses   Final diagnoses:  Dizziness     Discharge Instructions      Your EKG did not show any arrhythmia (irregular heart beat) or other acute changes. Still if this happens any more, you may want to go to the ER for them to evaluate you while you are having the symptoms.  Our staff will call you with lab results if anything is abnormal.     ED Prescriptions   None    PDMP not reviewed this encounter.   Barrett Henle, MD 03/23/21 1646    Barrett Henle, MD 03/23/21 [id number]

## 2021-03-24 ENCOUNTER — Ambulatory Visit
Admission: RE | Admit: 2021-03-24 | Discharge: 2021-03-24 | Disposition: A | Discharge status: Home / Self Care | Payer: Medicare HMO | Source: Ambulatory Visit | Attending: Vascular Surgery | Admitting: Vascular Surgery

## 2021-03-24 DIAGNOSIS — I700 Atherosclerosis of aorta: Secondary | ICD-10-CM | POA: Diagnosis not present

## 2021-03-24 DIAGNOSIS — R519 Headache, unspecified: Secondary | ICD-10-CM | POA: Diagnosis not present

## 2021-03-24 DIAGNOSIS — I739 Peripheral vascular disease, unspecified: Secondary | ICD-10-CM

## 2021-03-24 DIAGNOSIS — G518 Other disorders of facial nerve: Secondary | ICD-10-CM | POA: Diagnosis not present

## 2021-03-24 DIAGNOSIS — M542 Cervicalgia: Secondary | ICD-10-CM | POA: Diagnosis not present

## 2021-03-24 DIAGNOSIS — I714 Abdominal aortic aneurysm, without rupture, unspecified: Secondary | ICD-10-CM

## 2021-03-24 DIAGNOSIS — I7133 Infrarenal abdominal aortic aneurysm, ruptured: Secondary | ICD-10-CM | POA: Diagnosis not present

## 2021-03-24 DIAGNOSIS — M791 Myalgia, unspecified site: Secondary | ICD-10-CM | POA: Diagnosis not present

## 2021-03-24 DIAGNOSIS — K7689 Other specified diseases of liver: Secondary | ICD-10-CM | POA: Diagnosis not present

## 2021-03-24 MED ORDER — IOPAMIDOL (ISOVUE-370) INJECTION 76%
75.0000 mL | Freq: Once | INTRAVENOUS | Status: AC | PRN
  Administered 2021-03-24: 75 mL via INTRAVENOUS

## 2021-03-26 ENCOUNTER — Encounter: Payer: Self-pay | Admitting: Vascular Surgery

## 2021-03-26 ENCOUNTER — Ambulatory Visit (INDEPENDENT_AMBULATORY_CARE_PROVIDER_SITE_OTHER): Payer: Medicare HMO | Admitting: Vascular Surgery

## 2021-03-26 ENCOUNTER — Other Ambulatory Visit: Payer: Self-pay

## 2021-03-26 VITALS — BP 131/67 | HR 56 | Temp 98.1°F | Resp 20 | Ht 69.0 in | Wt 231.0 lb

## 2021-03-26 DIAGNOSIS — I714 Abdominal aortic aneurysm, without rupture, unspecified: Secondary | ICD-10-CM | POA: Diagnosis not present

## 2021-03-26 DIAGNOSIS — I739 Peripheral vascular disease, unspecified: Secondary | ICD-10-CM

## 2021-03-26 NOTE — Progress Notes (Addendum)
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  I do not think this can be fixed with a simple cuff and he would require a fenestrated graft to cover this.  However its tricky given that the aneurysm is right at the level of the left renal artery and the right renal artery comes off adjacent to the superior mesenteric artery.  I have recommended evaluation in [facility] for a potential fenestrated graft.  However the patient feels strongly about not going to [facility].  Given that the small saccular aneurysm has increased slightly compared to his previous scan I do think we need to consider repair.  The only options would be a fenestrated graft versus open repair.  I think he would be at very high risk for open repair given his age, COPD, and history of coronary artery disease.  I will review the films with my partners to see what other options we might have to have him evaluated for a fenestrated graft for this pararenal aneurysm.  PERIPHERAL ARTERIAL DISEASE: He is due for a follow-up graft study and ABIs in May of next year.  On following his left pop to posterior tibial bypass.  He has stable claudication of both legs.   HPI:   MCIHAEL [name] is a pleasant 80 y.o. male who I have been following with peripheral vascular disease.  I also saw him in consult in May of this year with a right common iliac artery aneurysm.  In reviewing his CT scan it was clear that he had undergone an endovascular aneurysm repair for this right common iliac artery aneurysm.  In reviewing care everywhere, the best I can tell his aneurysm was repaired in [facility] by Dr. Sammuel Hines in April 2011.  I do not have the actual operative report however.  I reviewed his CT of the abdomen and pelvis that was done in  December 2021 that showed an aneurysmal segment of the infrarenal aorta just above the endovascular stent graft but adjacent to the renal arteries.  He comes in today after getting a follow-up CT scan.  The results of which are discussed below.  The patient is also undergone a left below-knee pop to posterior tibial artery bypass in June 2011.  At the time of his visit on 01/08/2021 his graft was patent with some stenosis in the mid to distal superficial femoral artery noted.  However he had a triphasic signal in the posterior tibial artery with an ABI of 98%.  If this progressed we would have to consider exposure of the common femoral artery in the operating room with retrograde stenting of the superficial femoral artery given that he has an endovascular graft.  The patient does have a long history of chronic back pain.  He denies any abdominal pain.  He has stable claudication of both lower extremities.  He denies any history of rest pain or nonhealing ulcers.  He is not a smoker.  He is being treated for prostate cancer at the [place].  He has had some chest discomfort recently and is scheduled to see cardiology tomorrow.   Past Medical History:  Diagnosis Date   Atypical chest pain    CAD (coronary artery disease)    a. Multiple prior evaluations then 05/2012 s/p  BMS to OM2. b. Nuc 11/2012 wnl. c. Cath 2015: patent stent, moderate LAD/RCA dz, treated medically.   Cataract    CKD (chronic kidney disease), stage II    COPD (chronic obstructive pulmonary disease) (HCC)    Cough secondary to angiotensin converting enzyme inhibitor (ACE-I)    Diabetes mellitus without complication (HCC)    Diverticulosis    DVT (deep venous thrombosis) ([place])    Family history of breast cancer in first degree relative    GERD (gastroesophageal reflux disease)    Headache(784.0)    Hx: of   History of blood transfusion    "I've had 2; don't know what it was related to" (11/09/2012)   HLD (hyperlipidemia)    HTN  (hypertension)    Iron deficiency anemia    Lymphoproliferative disorder, low grade B cell ([place]) 05/21/2020   Microcytic anemia    Peripheral vascular disease ([place])    a. L pop-tibial bypass 2011, followed by [facility].   Personal history of prostate cancer    Rotator cuff injury    "left arm; never repaired" (11/09/2012)    Family History  Problem Relation Age of Onset   Breast cancer Mother        dx 33s   Hyperlipidemia Mother    Hypertension Mother    COPD Father        deceased   Hyperlipidemia Father    Hypertension Father    Hypertension Sister    Cancer Brother 59       Unknown cancer, possibly stomach   Hypertension Brother    Cancer Brother 12       Unknown, possibly lung   Hypertension Daughter    Hypertension Son    Colon cancer Neg Hx        pt is unclear about family hx    SOCIAL HISTORY: Social History   Tobacco Use   Smoking status: Former    Packs/day: 0.50    Years: 52.00    Pack years: 26.00    Types: Cigarettes    Quit date: 05/26/2011    Years since quitting: 9.8   Smokeless tobacco: Never  Substance Use Topics   Alcohol use: No    Allergies  Allergen Reactions   Topiramate Shortness Of Breath and Other (See Comments)     leg cramps   Atenolol Other (See Comments)    bradycardia   Lisinopril Cough   Tramadol-Acetaminophen Cough    Tolerates plain Tramadol    Current Outpatient Medications  Medication Sig Dispense Refill   abiraterone acetate (ZYTIGA) 250 MG tablet Take 1,000 mg by mouth daily.      Accu-Chek FastClix Lancets MISC Test once daily 102 each 12   acetaminophen (TYLENOL) 500 MG tablet Take 1,000 mg by mouth every 6 (six) hours as needed for mild pain or headache.     albuterol (VENTOLIN HFA) 108 (90 Base) MCG/ACT inhaler INHALE 2 PUFFS INTO THE LUNGS EVERY 6 HOURS AS NEEDED FOR WHEEZING OR SHORTNESS OF BREATH 54 g 0   amLODipine (NORVASC) 10 MG tablet Take 1 tablet (10 mg total) by mouth daily. 90 tablet 2   aspirin EC 81 MG  tablet Take 81 mg by mouth daily.      brimonidine (ALPHAGAN) 0.2 % ophthalmic solution Place 1 drop into both eyes 2 (two) times daily.     diclofenac Sodium (VOLTAREN) 1 % GEL Apply 2 g topically 2 (two) times daily as needed (back pain).     gabapentin (NEURONTIN)  600 MG tablet Take 600 mg by mouth at bedtime.      isosorbide mononitrate (IMDUR) 60 MG 24 hr tablet TAKE 1 TABLET(60 MG) BY MOUTH DAILY 90 tablet 1   latanoprost (XALATAN) 0.005 % ophthalmic solution Place 1 drop into both eyes at bedtime.     loratadine (CLARITIN) 10 MG tablet Take 10 mg by mouth daily as needed for allergies.     losartan-hydrochlorothiazide (HYZAAR) 100-25 MG tablet Take 1 tablet by mouth daily.     nitroGLYCERIN (NITROSTAT) 0.4 MG SL tablet Place 1 tablet (0.4 mg total) under the tongue every 5 (five) minutes as needed for chest pain (max 3 doses). 25 tablet 3   omeprazole (PRILOSEC) 20 MG capsule Take 20 mg by mouth in the morning and at bedtime.     pravastatin (PRAVACHOL) 40 MG tablet Take 1 tablet (40 mg total) by mouth every evening. 90 tablet 3   predniSONE (DELTASONE) 5 MG tablet Take 5 mg by mouth daily. While taking zytiga     RESTASIS 0.05 % ophthalmic emulsion Place 1 drop into both eyes 2 (two) times daily.     traMADol (ULTRAM) 50 MG tablet TAKE 1 TABLET(50 MG) BY MOUTH EVERY 8 HOURS AS NEEDED FOR SEVERE PAIN 30 tablet 0   No current facility-administered medications for this visit.    REVIEW OF SYSTEMS:  [X]  denotes positive finding, [ ]  denotes negative finding Cardiac  Comments:  Chest pain or chest pressure: x   Shortness of breath upon exertion: x   Short of breath when lying flat:    Irregular heart rhythm:        Vascular    Pain in calf, thigh, or hip brought on by ambulation:    Pain in feet at night that wakes you up from your sleep:     Blood clot in your veins:    Leg swelling:         Pulmonary    Oxygen at home:    Productive cough:     Wheezing:         Neurologic     Sudden weakness in arms or legs:     Sudden numbness in arms or legs:     Sudden onset of difficulty speaking or slurred speech:    Temporary loss of vision in one eye:     Problems with dizziness:         Gastrointestinal    Blood in stool:     Vomited blood:         Genitourinary    Burning when urinating:     Blood in urine:        Psychiatric    Major depression:         Hematologic    Bleeding problems:    Problems with blood clotting too easily:        Skin    Rashes or ulcers:        Constitutional    Fever or chills:     PHYSICAL EXAM:   Vitals:   03/26/21 1256  BP: 131/67  Pulse: (!) 56  Resp: 20  Temp: 98.1 F (36.7 C)  SpO2: 96%  Weight: 231 lb (104.8 kg)  Height: 5\' 9"  (1.753 m)    GENERAL: The patient is a well-nourished male, in no acute distress. The vital signs are documented above. CARDIAC: There is a regular rate and rhythm.  VASCULAR: I do not detect carotid bruits. He has palpable femoral pulses.  I cannot palpate pedal pulses. PULMONARY: There is good air exchange bilaterally without wheezing or rales. ABDOMEN: Soft and non-tender with normal pitched bowel sounds.  MUSCULOSKELETAL: There are no major deformities or cyanosis. NEUROLOGIC: No focal weakness or paresthesias are detected. SKIN: There are no ulcers or rashes noted. PSYCHIATRIC: The patient has a normal affect.  DATA:    CT ANGIO ABDOMEN PELVIS: I reviewed the CT angio abdomen pelvis that was done on 03/24/2021.  This showed that his aortic stent graft remains patent with exclusion of the right common iliac artery aneurysm.  There was no evidence of endoleak.  There was however slight progression of the focal aneurysmal disease of the native infrarenal aorta just above the stent graft and adjacent to the left renal artery.  This is was felt to be a small developing saccular aneurysm or penetrating atherosclerotic ulcer.  It was 11 mm in diameter.  Deitra Mayo [facility] [phone]

## 2021-03-26 NOTE — Progress Notes (Signed)
Chief Complaint  Patient presents with   Follow-up    Chest pain     History of Present Illness: 80 yo male with history of HTN, HLD, CAD, PAD, DM, MALT of orbit  who is here today for cardiac follow up. Cardiac cath February 2014 severe OM2 stenosis treated with a bare metal stent. Since then he has had chronic angina and has been seen in the office and ED many times. I saw him in the office in April 2015 and he c/o exertional chest pain c/w unstable angina. Cardiac cath 08/01/13 with patent stent Circumflex and mild to moderate disease in the LAD and RCA. Multiple ED visits with chest pain in 2017. Nuclear stress test November 2018 with  EF 55% with moderate inferior defect that partially improves consistent with ischemia and possible soft tissue attenuation.  Small region of anterior anterior septal ischemia. Diagnosed with prostate cancer in 2019. I saw him in the ED 07/15/17 and he had atypical chest pain with negative troponin and no EKG changes. He was seen in my office 08/01/17 and doing well. Most recently seen in the ED 01/28/18 with atypical chest pain, negative troponin. He was seen in the ED October 2019, April 2020, May 2020 and June 2020 with atypical chest pain. Cardiac cath April 2021 with moderate diffuse CAD but no focally obstructive lesions except in the PDA which is too small for stenting. He was seen in the ED 05/06/20 with chest pain, troponin negative. 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 He was [facility] to [facility] May 2022 with chest pain and had negative troponin. Echo May 2022 with LVEF=65%, no significant valve disease. Imdur was increased. His PAD is followed in [facility] by Dr. Scot Dock. He has a small saccular aneurysm of the infrarenal aorta. He saw Dr. Scot Dock on 03/26/21 and planning underway for repair.   He is here today for follow up. The patient denies any dyspnea, palpitations, lower extremity edema, orthopnea, PND, dizziness, near  syncope or syncope. He has chest pain several days per week with   Primary Care Physician:  Marcellina Millin  Past Medical History:  Diagnosis Date   Atypical chest pain    CAD (coronary artery disease)    a. Multiple prior evaluations then 05/2012 s/p BMS to OM2. b. Nuc 11/2012 wnl. c. Cath 2015: patent stent, moderate LAD/RCA dz, treated medically.   Cataract    CKD (chronic kidney disease), stage II    COPD (chronic obstructive pulmonary disease) (HCC)    Cough secondary to angiotensin converting enzyme inhibitor (ACE-I)    Diabetes mellitus without complication (HCC)    Diverticulosis    DVT (deep venous thrombosis) ([place])    Family history of breast cancer in first degree relative    GERD (gastroesophageal reflux disease)    Headache(784.0)    Hx: of   History of blood transfusion    "I've had 2; don't know what it was related to" (11/09/2012)   HLD (hyperlipidemia)    HTN (hypertension)    Iron deficiency anemia    Lymphoproliferative disorder, low grade B cell ([place]) 05/21/2020   Microcytic anemia    Peripheral vascular disease ([place])    a. L pop-tibial bypass 2011, followed by [facility].   Personal history of prostate cancer    Rotator cuff injury    "left arm; never repaired" (11/09/2012)    Past Surgical History:  Procedure Laterality Date   CARDIOVASCULAR STRESS  TEST  Aug. 2014   CATARACT EXTRACTION W/ INTRAOCULAR LENS IMPLANT Right ~ 2008   CIRCUMCISION     COLONOSCOPY     CORONARY ANGIOPLASTY WITH STENT PLACEMENT  2014   "1" (11/09/2012)   ILIAC ARTERY ANEURYSM REPAIR     LEFT HEART CATH  05-31-12   LEFT HEART CATH AND CORONARY ANGIOGRAPHY N/A 08/01/2019   Procedure: LEFT HEART CATH AND CORONARY ANGIOGRAPHY;  Surgeon: Burnell Blanks, MD;  Location: [place] [facility];  Service: Cardiovascular;  Laterality: N/A;   LEFT HEART CATHETERIZATION WITH CORONARY ANGIOGRAM N/A 06/01/2011   Procedure: LEFT HEART CATHETERIZATION WITH CORONARY ANGIOGRAM;  Surgeon: Hillary Bow, MD;  Location: [facility] CATH LAB;  Service: Cardiovascular;  Laterality: N/A;   LEFT HEART CATHETERIZATION WITH CORONARY ANGIOGRAM N/A 05/31/2012   Procedure: LEFT HEART CATHETERIZATION WITH CORONARY ANGIOGRAM;  Surgeon: Peter M Martinique, MD;  Location: [facility] CATH LAB;  Service: Cardiovascular;  Laterality: N/A;   LEFT HEART CATHETERIZATION WITH CORONARY ANGIOGRAM N/A 08/01/2013   Procedure: LEFT HEART CATHETERIZATION WITH CORONARY ANGIOGRAM;  Surgeon: Burnell Blanks, MD;  Location: [facility] CATH LAB;  Service: Cardiovascular;  Laterality: N/A;   PERCUTANEOUS CORONARY STENT INTERVENTION (PCI-S)  05/31/2012   Procedure: PERCUTANEOUS CORONARY STENT INTERVENTION (PCI-S);  Surgeon: Peter M Martinique, MD;  Location: [facility] CATH LAB;  Service: Cardiovascular;;   PR VEIN BYPASS GRAFT,AORTO-FEM-POP Left 10/01/2009   left below knee popliteal artery to posterior tibial artery   SHOULDER ARTHROSCOPY WITH OPEN ROTATOR CUFF REPAIR AND DISTAL CLAVICLE ACROMINECTOMY Left 01/12/2013   Procedure: LEFT SHOULDER ARTHROSCOPY WITH DEBRIDEMENT OPEN DISTAL CLAVICLE RESECTION ,acromioplastyAND ROTATOR CUFF REPAIR;  Surgeon: Yvette Rack., MD;  Location: [place];  Service: Orthopedics;  Laterality: Left;    Current Outpatient Medications  Medication Sig Dispense Refill   abiraterone acetate (ZYTIGA) 250 MG tablet Take 1,000 mg by mouth daily.      Accu-Chek FastClix Lancets MISC Test once daily 102 each 12   acetaminophen (TYLENOL) 500 MG tablet Take 1,000 mg by mouth every 6 (six) hours as needed for mild pain or headache.     albuterol (VENTOLIN HFA) 108 (90 Base) MCG/ACT inhaler INHALE 2 PUFFS INTO THE LUNGS EVERY 6 HOURS AS NEEDED FOR WHEEZING OR SHORTNESS OF BREATH 54 g 0   amLODipine (NORVASC) 10 MG tablet Take 1 tablet (10 mg total) by mouth daily. 90 tablet 2   aspirin EC 81 MG tablet Take 81 mg by mouth daily.      brimonidine (ALPHAGAN) 0.2 % ophthalmic solution Place 1 drop into both eyes 2 (two) times daily.      diclofenac Sodium (VOLTAREN) 1 % GEL Apply 2 g topically 2 (two) times daily as needed (back pain).     gabapentin (NEURONTIN) 600 MG tablet Take 600 mg by mouth at bedtime.      isosorbide mononitrate (IMDUR) 60 MG 24 hr tablet TAKE 1 TABLET(60 MG) BY MOUTH DAILY 90 tablet 1   latanoprost (XALATAN) 0.005 % ophthalmic solution Place 1 drop into both eyes at bedtime.     loratadine (CLARITIN) 10 MG tablet Take 10 mg by mouth daily as needed for allergies.     losartan-hydrochlorothiazide (HYZAAR) 100-25 MG tablet Take 1 tablet by mouth daily.     nitroGLYCERIN (NITROSTAT) 0.4 MG SL tablet Place 1 tablet (0.4 mg total) under the tongue every 5 (five) minutes as needed for chest pain (max 3 doses). 25 tablet 3   omeprazole (PRILOSEC) 20 MG capsule Take 20  mg by mouth in the morning and at bedtime.     pravastatin (PRAVACHOL) 40 MG tablet Take 1 tablet (40 mg total) by mouth every evening. 90 tablet 3   predniSONE (DELTASONE) 5 MG tablet Take 5 mg by mouth daily. While taking zytiga     RESTASIS 0.05 % ophthalmic emulsion Place 1 drop into both eyes 2 (two) times daily.     traMADol (ULTRAM) 50 MG tablet TAKE 1 TABLET(50 MG) BY MOUTH EVERY 8 HOURS AS NEEDED FOR SEVERE PAIN 30 tablet 0   No current facility-administered medications for this visit.    Allergies  Allergen Reactions   Topiramate Shortness Of Breath and Other (See Comments)     leg cramps   Atenolol Other (See Comments)    bradycardia   Lisinopril Cough   Tramadol-Acetaminophen Cough    Tolerates plain Tramadol    Social History   Socioeconomic History   Marital status: Widowed    Spouse name: Not on file   Number of children: Not on file   Years of education: Not on file   Highest education level: 12th grade  Occupational History   Occupation: Retired  Tobacco Use   Smoking status: Former    Packs/day: 0.50    Years: 52.00    Pack years: 26.00    Types: Cigarettes    Quit date: 05/26/2011    Years since quitting:  9.8   Smokeless tobacco: Never  Vaping Use   Vaping Use: Never used  Substance and Sexual Activity   Alcohol use: No   Drug use: No   Sexual activity: Not on file  Other Topics Concern   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  Lives in [place] by himself.      Patient writes with his right hand, though uses his left hand for every thing else. He lives alone in a one level home. He drinks 3 cups of coffee a day and 3 7-8 oz sodas a day. He does not exercise.   Social Determinants of Health   Financial Resource Strain: Not on file  Food Insecurity: Not on file  Transportation Needs: Not on file  Physical Activity: Not on file  Stress: Not on file  Social Connections: Not on file  Intimate Partner Violence: Not on file    Family History  Problem Relation Age of Onset   Breast cancer Mother        dx 54s   Hyperlipidemia Mother    Hypertension Mother    COPD Father        deceased   Hyperlipidemia Father    Hypertension Father    Hypertension Sister    Cancer Brother 52       Unknown cancer, possibly stomach   Hypertension Brother    Cancer Brother 78       Unknown, possibly lung   Hypertension Daughter    Hypertension Son    Colon cancer Neg Hx        pt is unclear about family hx    Review of Systems:  As stated in the HPI and otherwise negative.   BP 110/60    Pulse 62    Ht 5\' 9"  (1.753 m)    Wt 231 lb (104.8 kg)    SpO2 98%    BMI 34.11 kg/m   Physical Examination:  General: Well developed, well nourished, NAD  HEENT: OP clear, mucus membranes moist  SKIN:  warm, dry. No rashes. Neuro: No focal deficits  Musculoskeletal: Muscle strength 5/5 all ext  Psychiatric: Mood and affect normal  Neck: No JVD, no carotid bruits, no thyromegaly, no lymphadenopathy.  Lungs:Clear bilaterally, no wheezes, rhonci, crackles Cardiovascular: Regular rate and rhythm. No murmurs, gallops or rubs. Abdomen:Soft. Bowel sounds  present. Non-tender.  Extremities: No lower extremity edema. Pulses are 2 + in the bilateral DP/PT.  Echo May 2022: 1. Left ventricular ejection fraction, by estimation, is 65%. The left  ventricle has normal function. The left ventricle has no regional wall  motion abnormalities. Left ventricular diastolic parameters are consistent  with Grade I diastolic dysfunction  (impaired relaxation).   2. Right ventricular systolic function is normal. The right ventricular  size is normal.   3. The mitral valve is grossly normal. No evidence of mitral valve  regurgitation. No evidence of mitral stenosis.   4. The aortic valve was not well visualized. Aortic valve regurgitation  is trivial. No aortic stenosis is present.   5. Aortic dilatation noted. There is mild dilatation of the aortic root,  measuring 41 mm. There is mild dilatation of the ascending aorta,  measuring 40 mm. This is within normal limits for age and BSA.   Cardiac cath 08/01/2019: Prox RCA lesion is 40% stenosed. Mid RCA lesion is 40% stenosed. Mid RCA to Dist RCA lesion is 50% stenosed. RPDA lesion is 70% stenosed. 3rd RPL lesion is 70% stenosed. Prox Cx to Dist Cx lesion is 40% stenosed. Prox LAD to Mid LAD lesion is 40% stenosed. Mid LAD lesion is 40% stenosed. Dist LAD lesion is 70% stenosed. The left ventricular systolic function is normal. LV end diastolic pressure is normal. The left ventricular ejection fraction is 55-65% by visual estimate. There is no mitral valve regurgitation.   1. Moderate non-obstructive disease in the LAD and Circumflex 2. Moderate non-obstructive disease in the RCA. Severe disease in the small caliber PDA, too small for PCI 3. Normal LV systolic function  EKG:  EKG is not ordered today. The ekg ordered today demonstrates   Recent Labs: 07/17/2020: TSH 1.620 01/16/2021: ALT 11 03/23/2021: BUN 13; Creatinine, Ser 1.05; Hemoglobin 11.4; Platelets 219; Potassium 4.1; Sodium 138    Wt  Readings from Last 3 Encounters:  03/27/21 231 lb (104.8 kg)  03/26/21 231 lb (104.8 kg)  01/16/21 227 lb 6.4 oz (103.1 kg)     Other studies Reviewed: Additional studies/ records that were reviewed today include: . Review of the above records demonstrates:    Assessment and Plan:   1. CAD with stable angina: Most recent cath April 2021 with stable CAD, severe stenosis in small PDA that is too small for stenting. Otherwise mild to moderate disease in all three vessels. No recent worsening of baseline chronic chest pain. Continue ASA, statin, Norvasc and Imdur. See below for stress test   2. HTN: BP is controlled. No changes   3. Hyperlipidemia: Lipids followed in primary care. LDL 49 in May 2022. Continue statin  4. Pre-operative cardiovascular examination: He has known CAD. Stable angina overall. With upcoming abdominal aortic aneurysm surgery, will arrange a Lexiscan nuclear stress test to exclude ischemia.   Current medicines are reviewed at length with the patient today.  The patient does not have concerns regarding medicines.  The following changes have been made:  no change  Labs/ tests ordered today include:   Orders Placed This Encounter  Procedures   Cardiac Stress Test: Informed Consent Details: Physician/Practitioner Attestation; Transcribe to  consent form and obtain patient signature   Myocardial Perfusion Imaging     Disposition:   F/U with me in one year.   Signed, Lauree Chandler, MD 03/27/2021 10:43 AM    [facility] [address], [place], [place]  [address] Phone: [phone]; Fax: [phone]

## 2021-03-27 ENCOUNTER — Encounter: Payer: Self-pay | Admitting: *Deleted

## 2021-03-27 ENCOUNTER — Ambulatory Visit (INDEPENDENT_AMBULATORY_CARE_PROVIDER_SITE_OTHER): Payer: Medicare HMO | Admitting: Cardiovascular Disease

## 2021-03-27 ENCOUNTER — Encounter: Payer: Self-pay | Admitting: Cardiovascular Disease

## 2021-03-27 VITALS — BP 110/60 | HR 62 | Ht 69.0 in | Wt 231.0 lb

## 2021-03-27 DIAGNOSIS — I10 Essential (primary) hypertension: Secondary | ICD-10-CM | POA: Diagnosis not present

## 2021-03-27 DIAGNOSIS — E782 Mixed hyperlipidemia: Secondary | ICD-10-CM | POA: Diagnosis not present

## 2021-03-27 DIAGNOSIS — Z01810 Encounter for preprocedural cardiovascular examination: Secondary | ICD-10-CM | POA: Diagnosis not present

## 2021-03-27 DIAGNOSIS — I25118 Atherosclerotic heart disease of native coronary artery with other forms of angina pectoris: Secondary | ICD-10-CM | POA: Diagnosis not present

## 2021-03-27 NOTE — Patient Instructions (Addendum)
Medication Instructions:  No changes *If you need a refill on your cardiac medications before your next appointment, please call your pharmacy*   Lab Work: none   Testing/Procedures: Your physician has requested that you have a lexiscan myoview. For further information please visit [email or web address]. Please follow instruction sheet, as given.    Follow-Up: At [facility], you and your health needs are our priority.  As part of our continuing mission to provide you with exceptional heart care, we have created designated Provider Care Teams.  These Care Teams include your primary Cardiologist (physician) and Advanced Practice Providers (APPs -  Physician Assistants and Nurse Practitioners) who all work together to provide you with the care you need, when you need it.  We recommend signing up for the patient portal called "MyChart".  Sign up information is provided on this After Visit Summary.  MyChart is used to connect with patients for Virtual Visits (Telemedicine).  Patients are able to view lab/test results, encounter notes, upcoming appointments, etc.  Non-urgent messages can be sent to your provider as well.   To learn more about what you can do with MyChart, go to [email or web address].    Your next appointment:   6 month(s)  The format for your next appointment:   In Person  Provider:   Lauree Chandler, MD     Other Instructions

## 2021-03-30 DIAGNOSIS — M5416 Radiculopathy, lumbar region: Secondary | ICD-10-CM | POA: Diagnosis not present

## 2021-04-02 ENCOUNTER — Ambulatory Visit: Payer: Medicare HMO

## 2021-04-10 ENCOUNTER — Telehealth (HOSPITAL_COMMUNITY): Payer: Self-pay | Admitting: *Deleted

## 2021-04-10 NOTE — Telephone Encounter (Signed)
Patient given detailed instructions per Myocardial Perfusion Study Information Sheet for the test on 04/17/21 at 10:45. Patient notified to arrive 15 minutes early and that it is imperative to arrive on time for appointment to keep from having the test rescheduled.  If you need to cancel or reschedule your appointment, please call the office within 24 hours of your appointment. . Patient verbalized understanding.Joel [name]

## 2021-04-14 ENCOUNTER — Emergency Department (HOSPITAL_COMMUNITY): Payer: Medicare HMO

## 2021-04-14 ENCOUNTER — Other Ambulatory Visit: Payer: Self-pay

## 2021-04-14 ENCOUNTER — Emergency Department (HOSPITAL_COMMUNITY)
Admission: EM | Admit: 2021-04-14 | Discharge: 2021-04-14 | Disposition: A | Discharge status: Home / Self Care | Payer: Medicare HMO | Attending: Emergency Medicine | Admitting: Emergency Medicine

## 2021-04-14 ENCOUNTER — Encounter (HOSPITAL_COMMUNITY): Payer: Self-pay

## 2021-04-14 DIAGNOSIS — R001 Bradycardia, unspecified: Secondary | ICD-10-CM | POA: Diagnosis not present

## 2021-04-14 DIAGNOSIS — Z79899 Other long term (current) drug therapy: Secondary | ICD-10-CM | POA: Diagnosis not present

## 2021-04-14 DIAGNOSIS — I2510 Atherosclerotic heart disease of native coronary artery without angina pectoris: Secondary | ICD-10-CM | POA: Diagnosis not present

## 2021-04-14 DIAGNOSIS — R600 Localized edema: Secondary | ICD-10-CM | POA: Insufficient documentation

## 2021-04-14 DIAGNOSIS — Z8546 Personal history of malignant neoplasm of prostate: Secondary | ICD-10-CM | POA: Diagnosis not present

## 2021-04-14 DIAGNOSIS — R079 Chest pain, unspecified: Secondary | ICD-10-CM | POA: Insufficient documentation

## 2021-04-14 DIAGNOSIS — J449 Chronic obstructive pulmonary disease, unspecified: Secondary | ICD-10-CM | POA: Insufficient documentation

## 2021-04-14 DIAGNOSIS — R0789 Other chest pain: Secondary | ICD-10-CM | POA: Diagnosis not present

## 2021-04-14 DIAGNOSIS — Z950 Presence of cardiac pacemaker: Secondary | ICD-10-CM | POA: Diagnosis not present

## 2021-04-14 DIAGNOSIS — Z7982 Long term (current) use of aspirin: Secondary | ICD-10-CM | POA: Insufficient documentation

## 2021-04-14 LAB — CBC
HCT: 35.2 % — ABNORMAL LOW (ref 39.0–52.0)
Hemoglobin: 10.6 g/dL — ABNORMAL LOW (ref 13.0–17.0)
MCH: 21.6 pg — ABNORMAL LOW (ref 26.0–34.0)
MCHC: 30.1 g/dL (ref 30.0–36.0)
MCV: 71.8 fL — ABNORMAL LOW (ref 80.0–100.0)
Platelets: 174 10*3/uL (ref 150–400)
RBC: 4.9 MIL/uL (ref 4.22–5.81)
RDW: 18 % — ABNORMAL HIGH (ref 11.5–15.5)
WBC: 6 10*3/uL (ref 4.0–10.5)
nRBC: 0 % (ref 0.0–0.2)

## 2021-04-14 LAB — BASIC METABOLIC PANEL
Anion gap: 8 (ref 5–15)
BUN: 10 mg/dL (ref 8–23)
CO2: 28 mmol/L (ref 22–32)
Calcium: 9 mg/dL (ref 8.9–10.3)
Chloride: 102 mmol/L (ref 98–111)
Creatinine, Ser: 1.01 mg/dL (ref 0.61–1.24)
GFR, Estimated: >60 mL/min (ref >60)
Glucose, Bld: 143 mg/dL — ABNORMAL HIGH (ref 70–99)
Potassium: 3.6 mmol/L (ref 3.5–5.1)
Sodium: 138 mmol/L (ref 135–145)

## 2021-04-14 LAB — TROPONIN I (HIGH SENSITIVITY)
Troponin I (High Sensitivity): 11 ng/L (ref <18)
Troponin I (High Sensitivity): 8 ng/L

## 2021-04-14 NOTE — ED Provider Notes (Signed)
Emergency Medicine Provider Triage Evaluation Note  Joel [name] , a 81 y.o. male  was evaluated in triage.  Pt complains of chest pain. States that he had 2 episodes of same when he woke up this morning. Pain is in the left lower part of his chest and is sharp in nature. No SOB, pain is not pleuritic. Hx of stents placed. States he has been here several time over the past 2-3 years with similar pain.  Review of Systems  Positive: Chest pain Negative: Fever, chills, n/v/d  Physical Exam  BP 102/66 (BP Location: Right Arm)    Pulse (!) 53    Temp (!) 97.5 F (36.4 C) (Oral)    Resp 16    SpO2 95%  Gen:   Awake, no distress   Resp:  Normal effort  MSK:   Moves extremities without difficulty  Other:    Medical Decision Making  Medically screening exam initiated at 10:18 AM.  Appropriate orders placed.  Joel [name] was informed that the remainder of the evaluation will be completed by another provider, this initial triage assessment does not replace that evaluation, and the importance of remaining in the ED until their evaluation is complete.     Nestor Lewandowsky 04/14/21 1020    Pattricia Boss, MD 04/14/21 [phone]

## 2021-04-14 NOTE — ED Provider Notes (Signed)
[facility] EMERGENCY DEPARTMENT Provider Note   CSN: [id number] Arrival date & time: 04/14/21  0944     History  Chief Complaint  Patient presents with   Chest Pain    Joel [name] is a 81 y.o. male.  With past medical history of hyperlipidemia, coronary artery disease s/p stent placement x1, PVD, COPD, current prostate cancer who presents to the emergency department with chest pain.  Patient states that this morning he was woken up out of his sleep with a "blast of sharp pain" in his left chest.  He states that the episode lasted for 2 to 3 seconds and was nonradiating.  He states that the pain went away and then quickly had another episode of sharp left-sided chest pain.  He states that he then had lingering pain over breakfast.  States that the pain did not totally go away so he took tramadol which did not help.  He also states that he took 1 sublingual nitroglycerin which she states did not help.  He does state that he has been belching today, which is unusual for him, and when he does belch the pain goes away.  He denies any shortness of breath, palpitations, lightheadedness or dizziness, new lower extremity swelling.  He does not currently have chest pain.  He states that he does have chronic left lower extremity swelling from left vein bypass graft.  He states this is not increased from usual.  HPI     Home Medications Prior to Admission medications   Medication Sig Start Date End Date Taking? Authorizing Provider  abiraterone acetate (ZYTIGA) 250 MG tablet Take 1,000 mg by mouth daily.     [provider]  Accu-Chek FastClix Lancets MISC Test once daily 11/23/18   Raenette Rover, Vickie L, PA-C  acetaminophen (TYLENOL) 500 MG tablet Take 1,000 mg by mouth every 6 (six) hours as needed for mild pain or headache.    [provider]  albuterol (VENTOLIN HFA) 108 (90 Base) MCG/ACT inhaler INHALE 2 PUFFS INTO THE LUNGS EVERY 6 HOURS AS NEEDED FOR  WHEEZING OR SHORTNESS OF BREATH 03/15/19   Henson, Vickie L, PA-C  amLODipine (NORVASC) 10 MG tablet Take 1 tablet (10 mg total) by mouth daily. 12/16/20   Burnell Blanks, MD  aspirin EC 81 MG tablet Take 81 mg by mouth daily.     [provider]  brimonidine (ALPHAGAN) 0.2 % ophthalmic solution Place 1 drop into both eyes 2 (two) times daily. 05/09/20   [provider]  diclofenac Sodium (VOLTAREN) 1 % GEL Apply 2 g topically 2 (two) times daily as needed (back pain). 06/16/20   [provider]  gabapentin (NEURONTIN) 600 MG tablet Take 600 mg by mouth at bedtime.     [provider]  isosorbide mononitrate (IMDUR) 60 MG 24 hr tablet TAKE 1 TABLET(60 MG) BY MOUTH DAILY 03/23/21   Burnell Blanks, MD  latanoprost (XALATAN) 0.005 % ophthalmic solution Place 1 drop into both eyes at bedtime. 02/28/20   [provider]  loratadine (CLARITIN) 10 MG tablet Take 10 mg by mouth daily as needed for allergies.    [provider]  losartan-hydrochlorothiazide (HYZAAR) 100-25 MG tablet Take 1 tablet by mouth daily.    [provider]  nitroGLYCERIN (NITROSTAT) 0.4 MG SL tablet Place 1 tablet (0.4 mg total) under the tongue every 5 (five) minutes as needed for chest pain (max 3 doses). 10/20/20   Burnell Blanks, MD  omeprazole (  PRILOSEC) 20 MG capsule Take 20 mg by mouth in the morning and at bedtime.    [provider]  pravastatin (PRAVACHOL) 40 MG tablet Take 1 tablet (40 mg total) by mouth every evening. 06/06/20   Burnell Blanks, MD  predniSONE (DELTASONE) 5 MG tablet Take 5 mg by mouth daily. While taking zytiga 07/17/20   [provider]  RESTASIS 0.05 % ophthalmic emulsion Place 1 drop into both eyes 2 (two) times daily. 08/31/19   [provider]  traMADol (ULTRAM) 50 MG tablet TAKE 1 TABLET(50 MG) BY MOUTH EVERY 8 HOURS AS NEEDED FOR SEVERE PAIN 02/04/21   Denita Lung, MD       Allergies    Topiramate, Atenolol, Lisinopril, and Tramadol-acetaminophen    Review of Systems   Review of Systems  Constitutional:  Negative for fatigue and fever.  Respiratory:  Negative for cough and shortness of breath.   Cardiovascular:  Positive for chest pain. Negative for palpitations and leg swelling.  Gastrointestinal:  Negative for abdominal pain, nausea and vomiting.  Neurological:  Negative for dizziness, syncope, light-headedness and headaches.  All other systems reviewed and are negative.  Physical Exam Updated Vital Signs BP 121/71    Pulse (!) 54    Temp (!) 97.5 F (36.4 C) (Oral)    Resp 14    SpO2 98%  Physical Exam Vitals and nursing note reviewed.  Constitutional:      General: He is not in acute distress.    Appearance: Normal appearance. He is well-developed. He is not ill-appearing or toxic-appearing.  HENT:     Head: Normocephalic and atraumatic.     Mouth/Throat:     Mouth: Mucous membranes are moist.     Pharynx: Oropharynx is clear.  Eyes:     General: No scleral icterus.    Extraocular Movements: Extraocular movements intact.     Pupils: Pupils are equal, round, and reactive to light.  Neck:     Vascular: No JVD.  Cardiovascular:     Rate and Rhythm: Bradycardia present. Rhythm irregular.     Pulses:          Radial pulses are 2+ on the right side and 2+ on the left side.       Dorsalis pedis pulses are 2+ on the right side and 2+ on the left side.     Heart sounds: Normal heart sounds. No murmur heard. Pulmonary:     Effort: Pulmonary effort is normal. No tachypnea or respiratory distress.     Breath sounds: Normal breath sounds.  Chest:     Chest wall: No tenderness.  Abdominal:     General: Bowel sounds are normal.     Palpations: Abdomen is soft.     Tenderness: There is no abdominal tenderness.  Musculoskeletal:        General: Normal range of motion.     Cervical back: Normal range of motion and neck supple.     Right lower  leg: No tenderness. No edema.     Left lower leg: No tenderness. Edema present.     Comments: Chronic left lower extremity edema.  Patient endorses this is unchanged from previous and there is no increased swelling from baseline.  Skin:    General: Skin is warm and dry.     Capillary Refill: Capillary refill takes less than 2 seconds.  Neurological:     General: No focal deficit present.     Mental Status: He is alert  and oriented to person, place, and time.  Psychiatric:        Mood and Affect: Mood normal.        Behavior: Behavior normal.        Thought Content: Thought content normal.        Judgment: Judgment normal.    ED Results / Procedures / Treatments   Labs (all labs ordered are listed, but only abnormal results are displayed) Labs Reviewed  BASIC METABOLIC PANEL - Abnormal; Notable for the following components:      Result Value   Glucose, Bld 143 (*)    All other components within normal limits  CBC - Abnormal; Notable for the following components:   Hemoglobin 10.6 (*)    HCT 35.2 (*)    MCV 71.8 (*)    MCH 21.6 (*)    RDW 18.0 (*)    All other components within normal limits  TROPONIN I (HIGH SENSITIVITY)  TROPONIN I (HIGH SENSITIVITY)   EKG EKG Interpretation  Date/Time:  Tuesday April 14 2021 09:53:04 EST Ventricular Rate:  48 PR Interval:  260 QRS Duration: 84 QT Interval:  460 QTC Calculation: 410 R Axis:   33 Text Interpretation: Sinus bradycardia with marked sinus arrhythmia with 1st degree A-V block Otherwise normal ECG When compared with ECG of 23-Mar-2021 16:41, PREVIOUS ECG IS PRESENT Confirmed by Nanda Quinton [id number]) on 04/14/2021 4:34:00 PM  Radiology DG Chest 2 View  Result Date: 04/14/2021 CLINICAL DATA:  Chest pain EXAM: CHEST - 2 VIEW COMPARISON:  08/26/2020 FINDINGS: The heart size and mediastinal contours are within normal limits. Both lungs are clear. No pleural effusion or pneumothorax. No acute osseous abnormality. IMPRESSION: No  acute process in the chest. Electronically Signed   By: Macy Mis M.D.   On: 04/14/2021 10:29    Procedures Procedures  Evaluated the patient on cardiac monitor which shows a Mobitz type I  Medications Ordered in ED Medications - No data to display  ED Course/ Medical Decision Making/ A&P                           Medical Decision Making 81 year old male who presents emergency department with chest pain. Physical exam is without evidence of volume overload so doubt heart failure at this time. EKG without sign of ischemia or infarction.  He does have a Mobitz type I which is progressed from his previous first-degree heart block.  Troponin x2 negative.  Doubt ACS at this time. His symptoms are not consistent with an acute PE.  Wells low risk. Chest x-ray without pneumothorax, pneumonia.  His presentation is also not consistent with thoracic aortic dissection, pericarditis, tamponade, pneumonia -there are no infectious symptoms, leukocytosis, fever or evidence of infiltrate on chest x-ray, myocarditis.  Lab review Troponin x2 negative CBC without leukocytosis.  Hemoglobin 10.6, similar to previous BMP within normal limits  Imaging review Chest x-ray with no acute process EKG with second-degree Mobitz type I. HEAR Score: 4    1725: Spoke with Dr. Harrell Gave, cardiology attending who has reviewed initial and repeat EKG on the patient.  At this time she does not think patient needs any further intervention.  She states that as long as the patient is able to ambulate without dizziness or lightheadedness and has not had reported history of loss of consciousness or same then the patient have outpatient follow-up.  Able to ambulate the patient in the hallway without any lightheadedness, dizziness or presyncope.  Patient is able to ambulate without oxygen or dyspnea on exertion.  He is currently chest pain-free. At this time patient is clinically stable.  He does have bradycardia however  this has been documented previously.  He is not hypertensive or pertinently hypotensive with his bradycardia.  He is also not lightheaded, dizzy or having presyncope/syncope.  As noted above discussed with cardiology attending who recommends outpatient follow-up.  No treatment needed here in the emergency department. Patient has follow-up appointment with Dr. Julianne Handler on Friday, 04/17/2021.  Discussed with him to attend this appointment.  He verbalizes understanding.  Safe for discharge home.   Final Clinical Impression(s) / ED Diagnoses Final diagnoses:  Chest pain at rest    Rx / DC Orders ED Discharge Orders     None         Mickie Hillier, PA-C 04/14/21 2014    Margette Fast, MD 04/19/21 1747

## 2021-04-14 NOTE — ED Triage Notes (Signed)
Patient complains of 2 episodes of chest pain this am. Patient reports increased belching this am with same. Denies pain on arrival. Alert and oriented

## 2021-04-14 NOTE — Discharge Instructions (Addendum)
You are seen in the emergency department today for chest pain.  While you were here we did a work-up which showed that you did not have a heart attack.  Your work-up was reassuring.  I spoke with the cardiologist while you were here who looked at your EKG and thought it was reasonable for you to be followed up in the office.  You state you have an appointment with Dr. Angelena Form on Friday.  Please attend this appointment.  Please return to the emergency department for worsening chest pain, shortness of breath, lower extremity edema or palpitations.

## 2021-04-17 ENCOUNTER — Telehealth: Payer: Self-pay | Admitting: Vascular Surgery

## 2021-04-17 ENCOUNTER — Ambulatory Visit (HOSPITAL_COMMUNITY): Payer: Medicare HMO | Attending: Cardiovascular Disease

## 2021-04-17 ENCOUNTER — Other Ambulatory Visit: Payer: Self-pay

## 2021-04-17 DIAGNOSIS — Z01810 Encounter for preprocedural cardiovascular examination: Secondary | ICD-10-CM

## 2021-04-17 DIAGNOSIS — I10 Essential (primary) hypertension: Secondary | ICD-10-CM | POA: Diagnosis not present

## 2021-04-17 DIAGNOSIS — I25118 Atherosclerotic heart disease of native coronary artery with other forms of angina pectoris: Secondary | ICD-10-CM | POA: Diagnosis not present

## 2021-04-17 DIAGNOSIS — E782 Mixed hyperlipidemia: Secondary | ICD-10-CM

## 2021-04-17 LAB — MYOCARDIAL PERFUSION IMAGING
LV dias vol: 94 mL (ref 62–150)
LV sys vol: 37 mL
Nuc Stress EF: 61 %
Peak HR: 71 {beats}/min
Rest HR: 53 {beats}/min
Rest Nuclear Isotope Dose: 9.6 mCi
SDS: 0
SRS: 0
SSS: 0
ST Depression (mm): 0 mm
Stress Nuclear Isotope Dose: 30.1 mCi
TID: 1.07

## 2021-04-17 MED ORDER — REGADENOSON 0.4 MG/5ML IV SOLN
0.4000 mg | Freq: Once | INTRAVENOUS | Status: AC
  Administered 2021-04-17: 0.4 mg via INTRAVENOUS

## 2021-04-17 MED ORDER — TECHNETIUM TC 99M TETROFOSMIN IV KIT
30.1000 | PACK | Freq: Once | INTRAVENOUS | Status: AC | PRN
  Administered 2021-04-17: 30.1 via INTRAVENOUS
  Filled 2021-04-17: qty 31

## 2021-04-17 MED ORDER — TECHNETIUM TC 99M TETROFOSMIN IV KIT
9.6000 | PACK | Freq: Once | INTRAVENOUS | Status: AC | PRN
  Administered 2021-04-17: 9.6 via INTRAVENOUS
  Filled 2021-04-17: qty 10

## 2021-04-17 NOTE — Telephone Encounter (Signed)
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  He had an endovascular repair of an aneurysm I believe in [place] and the graft is well below the renal arteries.  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  I had wanted the to refer him back to [facility] but he absolutely refused to go and I have therefore reviewed the films with my partners.  We have considered all options and do not think that this could be addressed here as it would require a custom stent in [facility] would clearly be the best place for this to be done.  Given his age I do not think he would be a good candidate for open repair as this may even require a thoracoabdominal approach.  I had a long discussion with the patient today about my recommendation to go back to [facility] however he again feels very strongly that he absolutely does not want to go back to [facility].  He understands that there is a risk of this rupturing.  I am not aware of other centers that have the same experiences [facility] with these complicated fenestrated graft.  He is agreeable to have a follow-up CT scan in 3 months and I will arrange that study.

## 2021-04-23 DIAGNOSIS — M5416 Radiculopathy, lumbar region: Secondary | ICD-10-CM | POA: Diagnosis not present

## 2021-04-23 DIAGNOSIS — M5116 Intervertebral disc disorders with radiculopathy, lumbar region: Secondary | ICD-10-CM | POA: Diagnosis not present

## 2021-05-06 ENCOUNTER — Ambulatory Visit: Payer: Medicare HMO | Admitting: Podiatry

## 2021-05-11 ENCOUNTER — Ambulatory Visit: Payer: Medicare HMO | Admitting: Podiatry

## 2021-05-11 ENCOUNTER — Other Ambulatory Visit: Payer: Self-pay

## 2021-05-11 ENCOUNTER — Encounter: Payer: Self-pay | Admitting: Podiatry

## 2021-05-11 DIAGNOSIS — C8590 Non-Hodgkin lymphoma, unspecified, unspecified site: Secondary | ICD-10-CM | POA: Insufficient documentation

## 2021-05-11 DIAGNOSIS — R600 Localized edema: Secondary | ICD-10-CM

## 2021-05-11 DIAGNOSIS — Z5181 Encounter for therapeutic drug level monitoring: Secondary | ICD-10-CM | POA: Insufficient documentation

## 2021-05-11 DIAGNOSIS — K5900 Constipation, unspecified: Secondary | ICD-10-CM | POA: Insufficient documentation

## 2021-05-11 NOTE — Progress Notes (Signed)
HPI: 81 y.o. male presenting today for evaluation of swelling that happens to the patient's left foot.  He says that he experiences swelling especially by the end of the day.  He says that in the evenings when he goes to bed he wakes up in the morning and there is no swelling.  He denies a history of injury.  He does not really have any significant pain.  He does say he has some mild tenderness diffusely throughout the foot.  He presents for further treatment evaluation  Past Medical History:  Diagnosis Date   Atypical chest pain    CAD (coronary artery disease)    a. Multiple prior evaluations then 05/2012 s/p BMS to OM2. b. Nuc 11/2012 wnl. c. Cath 2015: patent stent, moderate LAD/RCA dz, treated medically.   Cataract    CKD (chronic kidney disease), stage II    COPD (chronic obstructive pulmonary disease) (HCC)    Cough secondary to angiotensin converting enzyme inhibitor (ACE-I)    Diabetes mellitus without complication (HCC)    Diverticulosis    DVT (deep venous thrombosis) ([place])    Family history of breast cancer in first degree relative    GERD (gastroesophageal reflux disease)    Headache(784.0)    Hx: of   History of blood transfusion    "I've had 2; don't know what it was related to" (11/09/2012)   HLD (hyperlipidemia)    HTN (hypertension)    Iron deficiency anemia    Lymphoproliferative disorder, low grade B cell ([place]) 05/21/2020   Microcytic anemia    Peripheral vascular disease ([place])    a. L pop-tibial bypass 2011, followed by [facility].   Personal history of prostate cancer    Rotator cuff injury    "left arm; never repaired" (11/09/2012)    Past Surgical History:  Procedure Laterality Date   CARDIOVASCULAR STRESS TEST  Aug. 2014   CATARACT EXTRACTION W/ INTRAOCULAR LENS IMPLANT Right ~ 2008   CIRCUMCISION     COLONOSCOPY     CORONARY ANGIOPLASTY WITH STENT PLACEMENT  2014   "1" (11/09/2012)   ILIAC ARTERY ANEURYSM REPAIR     LEFT HEART CATH  05-31-12   LEFT HEART CATH  AND CORONARY ANGIOGRAPHY N/A 08/01/2019   Procedure: LEFT HEART CATH AND CORONARY ANGIOGRAPHY;  Surgeon: Burnell Blanks, MD;  Location: [place] [facility];  Service: Cardiovascular;  Laterality: N/A;   LEFT HEART CATHETERIZATION WITH CORONARY ANGIOGRAM N/A 06/01/2011   Procedure: LEFT HEART CATHETERIZATION WITH CORONARY ANGIOGRAM;  Surgeon: Hillary Bow, MD;  Location: [facility] CATH LAB;  Service: Cardiovascular;  Laterality: N/A;   LEFT HEART CATHETERIZATION WITH CORONARY ANGIOGRAM N/A 05/31/2012   Procedure: LEFT HEART CATHETERIZATION WITH CORONARY ANGIOGRAM;  Surgeon: Peter M Martinique, MD;  Location: [facility] CATH LAB;  Service: Cardiovascular;  Laterality: N/A;   LEFT HEART CATHETERIZATION WITH CORONARY ANGIOGRAM N/A 08/01/2013   Procedure: LEFT HEART CATHETERIZATION WITH CORONARY ANGIOGRAM;  Surgeon: Burnell Blanks, MD;  Location: [facility] CATH LAB;  Service: Cardiovascular;  Laterality: N/A;   PERCUTANEOUS CORONARY STENT INTERVENTION (PCI-S)  05/31/2012   Procedure: PERCUTANEOUS CORONARY STENT INTERVENTION (PCI-S);  Surgeon: Peter M Martinique, MD;  Location: [facility] CATH LAB;  Service: Cardiovascular;;   PR VEIN BYPASS GRAFT,AORTO-FEM-POP Left 10/01/2009   left below knee popliteal artery to posterior tibial artery   SHOULDER ARTHROSCOPY WITH OPEN ROTATOR CUFF REPAIR AND DISTAL CLAVICLE ACROMINECTOMY Left 01/12/2013   Procedure: LEFT SHOULDER ARTHROSCOPY WITH DEBRIDEMENT OPEN DISTAL CLAVICLE RESECTION ,acromioplastyAND ROTATOR CUFF REPAIR;  Surgeon: Yvette Rack., MD;  Location: [place];  Service: Orthopedics;  Laterality: Left;    Allergies  Allergen Reactions   Topiramate Shortness Of Breath and Other (See Comments)     leg cramps   Atenolol Other (See Comments)    bradycardia   Lisinopril Cough   Tramadol-Acetaminophen Cough    Tolerates plain Tramadol     Physical Exam: General: The patient is alert and oriented x3 in no acute distress.  Dermatology: Skin is warm, dry and supple bilateral  lower extremities. Negative for open lesions or macerations.  Vascular: Palpable pedal pulses bilaterally. Capillary refill within normal limits.  Negative for any significant edema or erythema  Neurological: Light touch and protective threshold grossly intact  Musculoskeletal Exam: No pedal deformities noted   Assessment: 1.  Swelling by the end of the evening left foot   Plan of Care:  1. Patient evaluated.  2.  Today's exam is otherwise normal.  There is no significant pain on palpation and there is no swelling upon exam today. 3.  Compression ankle sleeve dispensed.  Wear daily to see if this helps alleviate some of the evening swelling 4.  Continue wearing good supportive shoes and sneakers 5.  Return to clinic as needed      Joel [name], DPM [facility]  Dr. Edrick [name], DPM    [address], [place] [address]                Office [phone]  Fax [phone]

## 2021-05-29 ENCOUNTER — Ambulatory Visit (INDEPENDENT_AMBULATORY_CARE_PROVIDER_SITE_OTHER): Payer: Medicare HMO

## 2021-05-29 VITALS — Ht 69.0 in | Wt 232.0 lb

## 2021-05-29 DIAGNOSIS — Z0000 Encounter for general adult medical examination without abnormal findings: Secondary | ICD-10-CM | POA: Diagnosis not present

## 2021-05-29 NOTE — Patient Instructions (Signed)
Joel [name] , Thank you for taking time to come for your Medicare Wellness Visit. I appreciate your ongoing commitment to your health goals. Please review the following plan we discussed and let me know if I can assist you in the future.   Screening recommendations/referrals: Colonoscopy: completed 07/01/2017 Recommended yearly ophthalmology/optometry visit for glaucoma screening and checkup Recommended yearly dental visit for hygiene and checkup  Vaccinations: Influenza vaccine: completed 01/16/2021, due next flu season Pneumococcal vaccine: completed 08/21/2019 Tdap vaccine: completed 12/23/2015, due 12/22/2025 Shingles vaccine: discussed   Covid-19:  02/24/2021, 07/21/2020, 02/12/2020, 06/13/2019, 05/16/2019  Advanced directives: copy in chart  Conditions/risks identified: none  Next appointment: Follow up in one year for your annual wellness visit.   Preventive Care 82 Years and Older, Male Preventive care refers to lifestyle choices and visits with your health care provider that can promote health and wellness. What does preventive care include? A yearly physical exam. This is also called an annual well check. Dental exams once or twice a year. Routine eye exams. Ask your health care provider how often you should have your eyes checked. Personal lifestyle choices, including: Daily care of your teeth and gums. Regular physical activity. Eating a healthy diet. Avoiding tobacco and drug use. Limiting alcohol use. Practicing safe sex. Taking low doses of aspirin every day. Taking vitamin and mineral supplements as recommended by your health care provider. What happens during an annual well check? The services and screenings done by your health care provider during your annual well check will depend on your age, overall health, lifestyle risk factors, and family history of disease. Counseling  Your health care provider may ask you questions about your: Alcohol use. Tobacco use. Drug  use. Emotional well-being. Home and relationship well-being. Sexual activity. Eating habits. History of falls. Memory and ability to understand (cognition). Work and work Statistician. Screening  You may have the following tests or measurements: Height, weight, and BMI. Blood pressure. Lipid and cholesterol levels. These may be checked every 5 years, or more frequently if you are over 82 years old. Skin check. Lung cancer screening. You may have this screening every year starting at age 73 if you have a 30-pack-year history of smoking and currently smoke or have quit within the past 15 years. Fecal occult blood test (FOBT) of the stool. You may have this test every year starting at age 48. Flexible sigmoidoscopy or colonoscopy. You may have a sigmoidoscopy every 5 years or a colonoscopy every 10 years starting at age 52. Prostate cancer screening. Recommendations will vary depending on your family history and other risks. Hepatitis C blood test. Hepatitis B blood test. Sexually transmitted disease (STD) testing. Diabetes screening. This is done by checking your blood sugar (glucose) after you have not eaten for a while (fasting). You may have this done every 1-3 years. Abdominal aortic aneurysm (AAA) screening. You may need this if you are a current or former smoker. Osteoporosis. You may be screened starting at age 16 if you are at high risk. Talk with your health care provider about your test results, treatment options, and if necessary, the need for more tests. Vaccines  Your health care provider may recommend certain vaccines, such as: Influenza vaccine. This is recommended every year. Tetanus, diphtheria, and acellular pertussis (Tdap, Td) vaccine. You may need a Td booster every 10 years. Zoster vaccine. You may need this after age 49. Pneumococcal 13-valent conjugate (PCV13) vaccine. One dose is recommended after age 3. Pneumococcal polysaccharide (PPSV23) vaccine. One dose  is  recommended after age 95. Talk to your health care provider about which screenings and vaccines you need and how often you need them. This information is not intended to replace advice given to you by your health care provider. Make sure you discuss any questions you have with your health care provider. Document Released: 04/25/2015 Document Revised: 12/17/2015 Document Reviewed: 01/28/2015 Elsevier Interactive Patient Education  2017 [place] Prevention in the Home Falls can cause injuries. They can happen to people of all ages. There are many things you can do to make your home safe and to help prevent falls. What can I do on the outside of my home? Regularly fix the edges of walkways and driveways and fix any cracks. Remove anything that might make you trip as you walk through a door, such as a raised step or threshold. Trim any bushes or trees on the path to your home. Use bright outdoor lighting. Clear any walking paths of anything that might make someone trip, such as rocks or tools. Regularly check to see if handrails are loose or broken. Make sure that both sides of any steps have handrails. Any raised decks and porches should have guardrails on the edges. Have any leaves, snow, or ice cleared regularly. Use sand or salt on walking paths during winter. Clean up any spills in your garage right away. This includes oil or grease spills. What can I do in the bathroom? Use night lights. Install grab bars by the toilet and in the tub and shower. Do not use towel bars as grab bars. Use non-skid mats or decals in the tub or shower. If you need to sit down in the shower, use a plastic, non-slip stool. Keep the floor dry. Clean up any water that spills on the floor as soon as it happens. Remove soap buildup in the tub or shower regularly. Attach bath mats securely with double-sided non-slip rug tape. Do not have throw rugs and other things on the floor that can make you  trip. What can I do in the bedroom? Use night lights. Make sure that you have a light by your bed that is easy to reach. Do not use any sheets or blankets that are too big for your bed. They should not hang down onto the floor. Have a firm chair that has side arms. You can use this for support while you get dressed. Do not have throw rugs and other things on the floor that can make you trip. What can I do in the kitchen? Clean up any spills right away. Avoid walking on wet floors. Keep items that you use a lot in easy-to-reach places. If you need to reach something above you, use a strong step stool that has a grab bar. Keep electrical cords out of the way. Do not use floor polish or wax that makes floors slippery. If you must use wax, use non-skid floor wax. Do not have throw rugs and other things on the floor that can make you trip. What can I do with my stairs? Do not leave any items on the stairs. Make sure that there are handrails on both sides of the stairs and use them. Fix handrails that are broken or loose. Make sure that handrails are as long as the stairways. Check any carpeting to make sure that it is firmly attached to the stairs. Fix any carpet that is loose or worn. Avoid having throw rugs at the top or bottom of the stairs.  If you do have throw rugs, attach them to the floor with carpet tape. Make sure that you have a light switch at the top of the stairs and the bottom of the stairs. If you do not have them, ask someone to add them for you. What else can I do to help prevent falls? Wear shoes that: Do not have high heels. Have rubber bottoms. Are comfortable and fit you well. Are closed at the toe. Do not wear sandals. If you use a stepladder: Make sure that it is fully opened. Do not climb a closed stepladder. Make sure that both sides of the stepladder are locked into place. Ask someone to hold it for you, if possible. Clearly mark and make sure that you can  see: Any grab bars or handrails. First and last steps. Where the edge of each step is. Use tools that help you move around (mobility aids) if they are needed. These include: Canes. Walkers. Scooters. Crutches. Turn on the lights when you go into a dark area. Replace any light bulbs as soon as they burn out. Set up your furniture so you have a clear path. Avoid moving your furniture around. If any of your floors are uneven, fix them. If there are any pets around you, be aware of where they are. Review your medicines with your doctor. Some medicines can make you feel dizzy. This can increase your chance of falling. Ask your doctor what other things that you can do to help prevent falls. This information is not intended to replace advice given to you by your health care provider. Make sure you discuss any questions you have with your health care provider. Document Released: 01/23/2009 Document Revised: 09/04/2015 Document Reviewed: 05/03/2014 Elsevier Interactive Patient Education  2017 [employer].

## 2021-05-29 NOTE — Progress Notes (Signed)
I connected with Joel [name] today by telephone and verified that I am speaking with the correct person using two identifiers. Location patient: home Location provider: work Persons participating in the virtual visit: Darl Kuss, Glenna Durand LPN.   I discussed the limitations, risks, security and privacy concerns of performing an evaluation and management service by telephone and the availability of in person appointments. I also discussed with the patient that there may be a patient responsible charge related to this service. The patient expressed understanding and verbally consented to this telephonic visit.    Interactive audio and video telecommunications were attempted between this provider and patient, however failed, due to patient having technical difficulties OR patient did not have access to video capability.  We continued and completed visit with audio only.     Vital signs may be patient reported or missing.  Subjective:   Joel [name] is a 81 y.o. male who presents for Medicare Annual/Subsequent preventive examination.  Review of Systems     Cardiac Risk Factors include: advanced age (>51men, >27 women);diabetes mellitus;dyslipidemia;hypertension;male gender;obesity (BMI >30kg/m2)     Objective:    Today's Vitals   05/29/21 1027  Weight: 232 lb (105.2 kg)  Height: 5\' 9"  (1.753 m)   Body mass index is 34.26 kg/m.  Advanced Directives 05/29/2021 04/14/2021 04/06/2020 02/10/2020 07/09/2019 01/17/2019 09/15/2018  Does Patient Have a Medical Advance Directive? Yes No No No No No No  Type of Advance Directive Out of facility DNR (pink MOST or yellow form) - - - - - -  Would patient like information on creating a medical advance directive? - No - Patient declined No - Patient declined No - Patient declined No - Patient declined No - Patient declined No - Patient declined  Pre-existing out of facility DNR order (yellow form or pink MOST form) Pink MOST/Yellow Form  most recent copy in chart - Physician notified to receive inpatient order - - - - - -    Current Medications (verified) Outpatient Encounter Medications as of 05/29/2021  Medication Sig   abiraterone acetate (ZYTIGA) 250 MG tablet Take 250 mg by mouth daily.   Accu-Chek FastClix Lancets MISC Test once daily   albuterol (VENTOLIN HFA) 108 (90 Base) MCG/ACT inhaler INHALE 2 PUFFS INTO THE LUNGS EVERY 6 HOURS AS NEEDED FOR WHEEZING OR SHORTNESS OF BREATH (Patient taking differently: 2 puffs every 6 (six) hours as needed for shortness of breath.)   amLODipine (NORVASC) 10 MG tablet Take 1 tablet (10 mg total) by mouth daily.   aspirin EC 81 MG tablet Take 81 mg by mouth daily.    brimonidine (ALPHAGAN) 0.2 % ophthalmic solution Place 1 drop into both eyes 2 (two) times daily.   diclofenac Sodium (VOLTAREN) 1 % GEL Apply 2 g topically 2 (two) times daily as needed (back pain).   docusate sodium (COLACE) 100 MG capsule Take 100 mg by mouth 2 (two) times daily.   ferrous sulfate 325 (65 FE) MG tablet Take 325 mg by mouth 2 (two) times daily with a meal.   gabapentin (NEURONTIN) 600 MG tablet Take 600 mg by mouth at bedtime.    isosorbide mononitrate (IMDUR) 60 MG 24 hr tablet TAKE 1 TABLET(60 MG) BY MOUTH DAILY (Patient taking differently: 60 mg daily.)   latanoprost (XALATAN) 0.005 % ophthalmic solution Place 1 drop into both eyes at bedtime.   loratadine (CLARITIN) 10 MG tablet Take 10 mg by mouth daily as needed for allergies.   losartan-hydrochlorothiazide (HYZAAR) 100-25 MG  tablet Take 1 tablet by mouth daily.   nitroGLYCERIN (NITROSTAT) 0.4 MG SL tablet Place 1 tablet (0.4 mg total) under the tongue every 5 (five) minutes as needed for chest pain (max 3 doses). (Patient taking differently: Place 0.4 mg under the tongue every 5 (five) minutes as needed for chest pain.)   omeprazole (PRILOSEC) 20 MG capsule Take 20 mg by mouth in the morning and at bedtime.   polycarbophil (FIBERCON) 625 MG tablet  Take 625 mg by mouth daily.   pravastatin (PRAVACHOL) 40 MG tablet Take 1 tablet (40 mg total) by mouth every evening.   predniSONE (DELTASONE) 5 MG tablet Take 5 mg by mouth daily.   tamsulosin (FLOMAX) 0.4 MG CAPS capsule Take 0.4 mg by mouth.   traMADol (ULTRAM) 50 MG tablet TAKE 1 TABLET(50 MG) BY MOUTH EVERY 8 HOURS AS NEEDED FOR SEVERE PAIN (Patient taking differently: Take 50 mg by mouth every 6 (six) hours as needed for moderate pain.)   No facility-administered encounter medications on file as of 05/29/2021.    Allergies (verified) Topiramate, Atenolol, Lisinopril, and Tramadol-acetaminophen   History: Past Medical History:  Diagnosis Date   Atypical chest pain    CAD (coronary artery disease)    a. Multiple prior evaluations then 05/2012 s/p BMS to OM2. b. Nuc 11/2012 wnl. c. Cath 2015: patent stent, moderate LAD/RCA dz, treated medically.   Cataract    CKD (chronic kidney disease), stage II    COPD (chronic obstructive pulmonary disease) (HCC)    Cough secondary to angiotensin converting enzyme inhibitor (ACE-I)    Diabetes mellitus without complication (HCC)    Diverticulosis    DVT (deep venous thrombosis) ([place])    Family history of breast cancer in first degree relative    GERD (gastroesophageal reflux disease)    Headache(784.0)    Hx: of   History of blood transfusion    "I've had 2; don't know what it was related to" (11/09/2012)   HLD (hyperlipidemia)    HTN (hypertension)    Iron deficiency anemia    Lymphoproliferative disorder, low grade B cell ([place]) 05/21/2020   Microcytic anemia    Peripheral vascular disease ([place])    a. L pop-tibial bypass 2011, followed by [facility].   Personal history of prostate cancer    Rotator cuff injury    "left arm; never repaired" (11/09/2012)   Past Surgical History:  Procedure Laterality Date   CARDIOVASCULAR STRESS TEST  Aug. 2014   CATARACT EXTRACTION W/ INTRAOCULAR LENS IMPLANT Right ~ 2008   CIRCUMCISION     COLONOSCOPY      CORONARY ANGIOPLASTY WITH STENT PLACEMENT  2014   "1" (11/09/2012)   ILIAC ARTERY ANEURYSM REPAIR     LEFT HEART CATH  05-31-12   LEFT HEART CATH AND CORONARY ANGIOGRAPHY N/A 08/01/2019   Procedure: LEFT HEART CATH AND CORONARY ANGIOGRAPHY;  Surgeon: Burnell Blanks, MD;  Location: [facility];  Service: Cardiovascular;  Laterality: N/A;   LEFT HEART CATHETERIZATION WITH CORONARY ANGIOGRAM N/A 06/01/2011   Procedure: LEFT HEART CATHETERIZATION WITH CORONARY ANGIOGRAM;  Surgeon: Hillary Bow, MD;  Location: [facility] CATH LAB;  Service: Cardiovascular;  Laterality: N/A;   LEFT HEART CATHETERIZATION WITH CORONARY ANGIOGRAM N/A 05/31/2012   Procedure: LEFT HEART CATHETERIZATION WITH CORONARY ANGIOGRAM;  Surgeon: Peter M Martinique, MD;  Location: [facility] CATH LAB;  Service: Cardiovascular;  Laterality: N/A;   LEFT HEART CATHETERIZATION WITH CORONARY ANGIOGRAM N/A 08/01/2013   Procedure: LEFT HEART CATHETERIZATION WITH CORONARY ANGIOGRAM;  Surgeon: Burnell Blanks, MD;  Location: [facility] CATH LAB;  Service: Cardiovascular;  Laterality: N/A;   PERCUTANEOUS CORONARY STENT INTERVENTION (PCI-S)  05/31/2012   Procedure: PERCUTANEOUS CORONARY STENT INTERVENTION (PCI-S);  Surgeon: Peter M Martinique, MD;  Location: [facility] CATH LAB;  Service: Cardiovascular;;   PR VEIN BYPASS GRAFT,AORTO-FEM-POP Left 10/01/2009   left below knee popliteal artery to posterior tibial artery   SHOULDER ARTHROSCOPY WITH OPEN ROTATOR CUFF REPAIR AND DISTAL CLAVICLE ACROMINECTOMY Left 01/12/2013   Procedure: LEFT SHOULDER ARTHROSCOPY WITH DEBRIDEMENT OPEN DISTAL CLAVICLE RESECTION ,acromioplastyAND ROTATOR CUFF REPAIR;  Surgeon: Yvette Rack., MD;  Location: [facility];  Service: Orthopedics;  Laterality: Left;   Family History  Problem Relation Age of Onset   Breast cancer Mother        dx 27s   Hyperlipidemia Mother    Hypertension Mother    COPD Father        deceased   Hyperlipidemia Father    Hypertension Father    Hypertension  Sister    Cancer Brother 73       Unknown cancer, possibly stomach   Hypertension Brother    Cancer Brother 36       Unknown, possibly lung   Hypertension Daughter    Hypertension Son    Colon cancer Neg Hx        pt is unclear about family hx   Social History   Socioeconomic History   Marital status: Widowed    Spouse name: Not on file   Number of children: Not on file   Years of education: Not on file   Highest education level: 12th grade  Occupational History   Occupation: Retired  Tobacco Use   Smoking status: Former    Packs/day: 0.50    Years: 52.00    Pack years: 26.00    Types: Cigarettes    Quit date: 05/26/2011    Years since quitting: 10.0    Passive exposure: Past   Smokeless tobacco: Never  Vaping Use   Vaping Use: Never used  Substance and Sexual Activity   Alcohol use: No   Drug use: No   Sexual activity: Not on file  Other Topics Concern   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  Lives in [place] by himself.      Patient writes with his right hand, though uses his left hand for every thing else. He lives alone in a one level home. He drinks 3 cups of coffee a day and 3 7-8 oz sodas a day. He does not exercise.   Social Determinants of Health   Financial Resource Strain: Low Risk    Difficulty of Paying Living Expenses: Not hard at all  Food Insecurity: No Food Insecurity   Worried About Charity fundraiser in the Last Year: Never true   [place] in the Last Year: Never true  Transportation Needs: No Transportation Needs   Lack of Transportation (Medical): No   Lack of Transportation (Non-Medical): No  Physical Activity: Inactive   Days of Exercise per Week: 0 days   Minutes of Exercise per Session: 0 min  Stress: No Stress Concern Present   Feeling of Stress : Not at all  Social Connections: Not on file    Tobacco Counseling Counseling given: Not Answered   Clinical Intake:  Pre-visit  preparation completed: Yes  Pain : No/denies pain     Nutritional Status: BMI >  30  Obese Nutritional Risks: None Diabetes: Yes  How often do you need to have someone help you when you read instructions, pamphlets, or other written materials from your doctor or pharmacy?: 1 - Never What is the last grade level you completed in school?: 12th grade  Diabetic? Yes Nutrition Risk Assessment:  Has the patient had any N/V/D within the last 2 months?  No  Does the patient have any non-healing wounds?  No  Has the patient had any unintentional weight loss or weight gain?  No   Diabetes:  Is the patient diabetic?  Yes  If diabetic, was a CBG obtained today?  No  Did the patient bring in their glucometer from home?  No  How often do you monitor your CBG's? Once a month.   Financial Strains and Diabetes Management:  Are you having any financial strains with the device, your supplies or your medication? No .  Does the patient want to be seen by Chronic Care Management for management of their diabetes?  No  Would the patient like to be referred to a Nutritionist or for Diabetic Management?  No   Diabetic Exams:  Diabetic Eye Exam: Completed 07/02/2020 Diabetic Foot Exam: Overdue, Pt has been advised about the importance in completing this exam. Pt is scheduled for diabetic foot exam on next appointment.   Interpreter Needed?: No  Information entered by :: NAllen LPN   Activities of Daily Living In your present state of health, do you have any difficulty performing the following activities: 05/29/2021 08/26/2020  Hearing? N N  Vision? N N  Difficulty concentrating or making decisions? N N  Walking or climbing stairs? N N  Comment - -  Dressing or bathing? N N  Doing errands, shopping? N N  Preparing Food and eating ? N -  Using the Toilet? N -  In the past six months, have you accidently leaked urine? N -  Do you have problems with loss of bowel control? N -  Managing your  Medications? N -  Managing your Finances? N -  Housekeeping or managing your Housekeeping? N -  Some recent data might be hidden    Patient Care Team: Marcellina Millin as PCP - General (Physician Assistant) Burnell Blanks, MD as PCP - Cardiology (Cardiology) Burnell Blanks, MD as Consulting Physician (Cardiology) Clinic, Veryl Speak, Mariam Dollar, [facility] as Pharmacist (Pharmacist)  Indicate any recent Medical Services you may have received from other than [facility] providers in the past year (date may be approximate).     Assessment:   This is a routine wellness examination for Joel [name].  Hearing/Vision screen Vision Screening - Comments:: Regular eye exams, Dr. Katy Fitch   Dietary issues and exercise activities discussed: Current Exercise Habits: The patient does not participate in regular exercise at present   Goals Addressed             This Visit's Progress    Patient Stated       05/29/2021, no goals       Depression Screen PHQ 2/9 Scores 05/29/2021 07/17/2020 09/11/2018 08/24/2017 06/05/2014  PHQ - 2 Score 0 0 0 0 0    Fall Risk Fall Risk  05/29/2021 07/17/2020 09/11/2018 01/31/2018 08/24/2017  Falls in the past year? 0 0 0 No No  Number falls in past yr: - 0 0 - -  Injury with Fall? - 0 0 - -  Risk for fall due to : Medication side effect No Fall  Risks - - -  Follow up Falls evaluation completed;Education provided;Falls prevention discussed Falls evaluation completed - - -    FALL RISK PREVENTION PERTAINING TO THE HOME:  Any stairs in or around the home? No  If so, are there any without handrails?  N/a Home free of loose throw rugs in walkways, pet beds, electrical cords, etc? Yes  Adequate lighting in your home to reduce risk of falls? Yes   ASSISTIVE DEVICES UTILIZED TO PREVENT FALLS:  Life alert? No  Use of a cane, walker or w/c? Yes  Grab bars in the bathroom? Yes  Shower chair or bench in shower? Yes  Elevated toilet seat or a  handicapped toilet? No   TIMED UP AND GO:  Was the test performed? No .      Cognitive Function:     6CIT Screen 05/29/2021  What Year? 0 points  What month? 0 points  What time? 0 points  Count back from 20 0 points  Months in reverse 2 points  Repeat phrase 6 points  Total Score 8    Immunizations Immunization History  Administered Date(s) Administered   Fluad Quad(high Dose 65+) 01/10/2019, 01/16/2021   Influenza, High Dose Seasonal PF 02/08/2017, 01/20/2018   Influenza-Unspecified 01/23/2020   Moderna Sars-Covid-2 Vaccination 05/16/2019, 06/13/2019, 02/12/2020, 07/21/2020   Pfizer Covid-19 Vaccine Bivalent Booster 58yrs & up 02/24/2021   Pneumococcal Conjugate-13 08/21/2019   Pneumococcal-Unspecified 01/23/2020   Tdap 12/23/2015    TDAP status: Up to date  Flu Vaccine status: Up to date  Pneumococcal vaccine status: Due, Education has been provided regarding the importance of this vaccine. Advised may receive this vaccine at local pharmacy or Health Dept. Aware to provide a copy of the vaccination record if obtained from local pharmacy or Health Dept. Verbalized acceptance and understanding.  Covid-19 vaccine status: Completed vaccines  Qualifies for Shingles Vaccine? Yes   Zostavax completed No   Shingrix Completed?: No.    Education has been provided regarding the importance of this vaccine. Patient has been advised to call insurance company to determine out of pocket expense if they have not yet received this vaccine. Advised may also receive vaccine at local pharmacy or Health Dept. Verbalized acceptance and understanding.  Screening Tests Health Maintenance  Topic Date Due   FOOT EXAM  Never done   Zoster Vaccines- Shingrix (1 of 2) Never done   Pneumonia Vaccine 45+ Years old (2 - PPSV23 if available, else PCV20) 01/22/2021   OPHTHALMOLOGY EXAM  07/02/2021   HEMOGLOBIN A1C  07/17/2021   TETANUS/TDAP  12/22/2025   INFLUENZA VACCINE  Completed    COVID-19 Vaccine  Completed   HPV VACCINES  Aged Out    Health Maintenance  Health Maintenance Due  Topic Date Due   FOOT EXAM  Never done   Zoster Vaccines- Shingrix (1 of 2) Never done   Pneumonia Vaccine 20+ Years old (2 - PPSV23 if available, else PCV20) 01/22/2021    Colorectal cancer screening: Type of screening: Colonoscopy. Completed 07/01/2017. Repeat every 10 years  Lung Cancer Screening: (Low Dose CT Chest recommended if Age 62-80 years, 30 pack-year currently smoking OR have quit w/in 15years.) does not qualify.   Lung Cancer Screening Referral: no  Additional Screening:  Hepatitis C Screening: does not qualify;   Vision Screening: Recommended annual ophthalmology exams for early detection of glaucoma and other disorders of the eye. Is the patient up to date with their annual eye exam?  Yes  Who is the provider  or what is the name of the office in which the patient attends annual eye exams? Dr. Katy Fitch  If pt is not established with a provider, would they like to be referred to a provider to establish care? No .   Dental Screening: Recommended annual dental exams for proper oral hygiene  Community Resource Referral / Chronic Care Management: CRR required this visit?  No   CCM required this visit?  No      Plan:     I have personally reviewed and noted the following in the patients chart:   Medical and social history Use of alcohol, tobacco or illicit drugs  Current medications and supplements including opioid prescriptions. Patient is not currently taking opioid prescriptions. Functional ability and status Nutritional status Physical activity Advanced directives List of other physicians Hospitalizations, surgeries, and ER visits in previous 12 months Vitals Screenings to include cognitive, depression, and falls Referrals and appointments  In addition, I have reviewed and discussed with patient certain preventive protocols, quality metrics, and best  practice recommendations. A written personalized care plan for preventive services as well as general preventive health recommendations were provided to patient.     [name], LPN   10/10/1599   Nurse Notes: none

## 2021-06-02 DIAGNOSIS — M5416 Radiculopathy, lumbar region: Secondary | ICD-10-CM | POA: Diagnosis not present

## 2021-06-02 DIAGNOSIS — Z6832 Body mass index (BMI) 32.0-32.9, adult: Secondary | ICD-10-CM | POA: Diagnosis not present

## 2021-06-02 DIAGNOSIS — M5116 Intervertebral disc disorders with radiculopathy, lumbar region: Secondary | ICD-10-CM | POA: Diagnosis not present

## 2021-06-10 ENCOUNTER — Telehealth: Payer: Self-pay | Admitting: Pharmacist

## 2021-06-10 DIAGNOSIS — M5116 Intervertebral disc disorders with radiculopathy, lumbar region: Secondary | ICD-10-CM | POA: Diagnosis not present

## 2021-06-10 NOTE — Chronic Care Management (AMB) (Signed)
? ? ?Chronic Care Management ?Pharmacy Assistant  ? ?Name: Joel [name]  MRN: [id number] DOB: Sep 14, 1940 ? ?Reason for Encounter: Chart prep for initial encounter with Joel [name] Clinical Pharmacist on 06/11/21 at 11 am in office ?  ?Conditions to be addressed/monitored: ?HTN, HLD, COPD, DMII, and GERD ? ?Recent office visits:  ?05/29/21 Joel Simmering, LPN - Patient presented for Medicare Annual Wellness Exam. No medication changes. ? ?Recent consult visits:  ?05/19/21 [name]) - Patient presented for Essential hypertension and other concerns. No other visit details available/ ? ?05/12/21 [name] - Patient presented for constipation unspecified. No other visit details available/ ? ?05/11/21 Joel [name], DPM ( Podiatry) - Patient presented for Edema of left foot. No medication changes. ? ?04/23/21 Joel [name] - Patient presented for Intervertebral disc disorders and other concerns. ? ?04/22/21 [name] - No other visit details available ? ?03/30/21 Joel [name] (Phys Med) - Patient presented for Lumbar Radiculopathy. No other visit details available. ? ?03/27/21 Joel Blanks, MD ( Cardiology) - Patient presented for Coronary artery disease of native artery and other concerns. No medication changes. ? ?03/26/21 Joel Mould, MD (Vasc Surg) - Patient presented for Abdominal aortic aneurysm without rupture and other concerns. No medication changes. ? ?03/25/21 Joel [name] ( [place]) - Patient presented for Malignant neoplasm of prostate and other concerns. No other visit details available ? ?Hospital visits:  ?Medication Reconciliation was completed by comparing discharge summary, patient?s EMR and Pharmacy list, and upon discussion with patient. ? ?[facility] to [name] on 04/[facility]/21 due to Chest pain at rest. Patient was present for 10 hours. ? ?New?Medications Started at [name]:?? ?-started   ?none ? ?Medication Changes at Hospital Discharge: ?-Changed  ?none ? ?Medications Discontinued at Hospital Discharge: ?-Stopped  ?none ? ?Medications that remain the same after Hospital Discharge:??  ?-All other medications will remain the same.   ? ?Medication Reconciliation was completed by comparing discharge summary, patient?s EMR and Pharmacy list, and upon discussion with patient. ? ?[name] due to diziness. Patient was present for 1 hour ? ?New?Medications Started at [facility] Discharge:?? ?-started  ?none ? ?Medication Changes at Hospital Discharge: ?-Changed  ?none ? ?Medications Discontinued at Hospital Discharge: ?-Stopped  ?none ? ?Medications that remain the same after Hospital Discharge:??  ?-All other medications will remain the same.   ? ?Medications: ?Outpatient Encounter Medications as of 06/10/2021  ?Medication Sig  ? abiraterone acetate (ZYTIGA) 250 MG tablet Take 250 mg by mouth daily.  ? Accu-Chek FastClix Lancets MISC Test once daily  ? albuterol (VENTOLIN HFA) 108 (90 Base) MCG/ACT inhaler INHALE 2 PUFFS INTO THE LUNGS EVERY 6 HOURS AS NEEDED FOR WHEEZING OR SHORTNESS OF BREATH (Patient taking differently: 2 puffs every 6 (six) hours as needed for shortness of breath.)  ? amLODipine (NORVASC) 10 MG tablet Take 1 tablet (10 mg total) by mouth daily.  ? aspirin EC 81 MG tablet Take 81 mg by mouth daily.   ? brimonidine (ALPHAGAN) 0.2 % ophthalmic solution Place 1 drop into both eyes 2 (two) times daily.  ? diclofenac Sodium (VOLTAREN) 1 % GEL Apply 2 g topically 2 (two) times daily as needed (back pain).  ? docusate sodium (COLACE) 100 MG capsule Take 100 mg by mouth 2 (two) times daily.  ? ferrous sulfate 325 (65 FE) MG tablet Take 325 mg by mouth 2 (two) times daily with a  meal.  ? gabapentin (NEURONTIN) 600 MG tablet Take 600 mg by mouth at bedtime.   ? isosorbide mononitrate (IMDUR) 60 MG 24 hr tablet TAKE 1 TABLET(60 MG) BY MOUTH DAILY  (Patient taking differently: 60 mg daily.)  ? latanoprost (XALATAN) 0.005 % ophthalmic solution Place 1 drop into both eyes at bedtime.  ? loratadine (CLARITIN) 10 MG tablet Take 10 mg by mouth daily as needed for allergies.  ? losartan-hydrochlorothiazide (HYZAAR) 100-25 MG tablet Take 1 tablet by mouth daily.  ? nitroGLYCERIN (NITROSTAT) 0.4 MG SL tablet Place 1 tablet (0.4 mg total) under the tongue every 5 (five) minutes as needed for chest pain (max 3 doses). (Patient taking differently: Place 0.4 mg under the tongue every 5 (five) minutes as needed for chest pain.)  ? omeprazole (PRILOSEC) 20 MG capsule Take 20 mg by mouth in the morning and at bedtime.  ? polycarbophil (FIBERCON) 625 MG tablet Take 625 mg by mouth daily.  ? pravastatin (PRAVACHOL) 40 MG tablet Take 1 tablet (40 mg total) by mouth every evening.  ? predniSONE (DELTASONE) 5 MG tablet Take 5 mg by mouth daily.  ? tamsulosin (FLOMAX) 0.4 MG CAPS capsule Take 0.4 mg by mouth.  ? traMADol (ULTRAM) 50 MG tablet TAKE 1 TABLET(50 MG) BY MOUTH EVERY 8 HOURS AS NEEDED FOR SEVERE PAIN (Patient taking differently: Take 50 mg by mouth every 6 (six) hours as needed for moderate pain.)  ? ?No facility-administered encounter medications on file as of 06/10/2021.  ?Fill History  : ?ACCU-CHEK FASTCLIX LANCETS 102'S 11/23/2018 90  ? ?ALBUTEROL HFA INH(200 PUFFS)18GM 03/15/2019 75  ? ?AMLODIPINE BESYLATE 10MG  TABLETS 03/17/2021 90  ? ?BRIMONIDINE 0.2% OPHTH SOLN 5ML 03/02/2021 75  ? ?GABAPENTIN 100 MG CAPSULE 03/23/2019 15  ? ?ISOSORBIDE MONONITRATE 60MG  ER TABS 03/23/2021 90  ? ?latanoprost 0.005% drops 01/26/2021 1  ? ?LOSARTAN/HCTZ 100/25MG  TABLETS 06/05/2020 90  ? ?nitroglycerin 0.4 mg tablet, sublingual 01/26/2021 1  ? ?omeprazole 20 mg capsule,delayed release(DR/EC) 01/26/2021 1  ? ?PRAVASTATIN 40MG  TABLETS 04/25/2021 90  ? ?PREDNISONE 10MG  TABS PACK 21S 02/16/2021 6  ? ?tamsulosin 0.4 mg capsule 01/26/2021 1  ? ?TRAMADOL 50MG  TABLETS 02/04/2021 10  ?Have you  seen any other providers since your last visit? Patient reports no ? ?Any changes in your medications or health? Patient reports none ? ?Any side effects from any medications? Patient reports no ? ?Do you have an symptoms or problems not managed by your medications? Patient reports no ? ?Can you think of a goal you would like to reach for your health? Patient reports not at this time ? ?Patient aware to have available medications for phone call with MP on tomorrow. Did not go over all questions as patient was adamant he had just discussed with someone in the office recently. Patient desired to have phone call instead of in person appointment. Confirmed to reach on home/cell. ? ? ?Care Gaps: ?Foot Exam - Overdue ?Zoster Vaccine - Overdue ?BP- 110/60 (03/27/21) ?AWV- 05/29/21 ?Lab Results  ?Component Value Date  ? HGBA1C 6.1 (H) 01/16/2021  ? ? ?Star Rating Drugs: ?Losartan HCTZ 100-25 mg -  Last filled 06/05/20 90 DS at [facility] Verified as accurate ?Pravastatin 40 mg - Last filled 04/25/21 90 DS at [facility] ? ? ?Ned Clines CMA ?Clinical Pharmacist Assistant ?[phone] ? ?

## 2021-06-11 ENCOUNTER — Ambulatory Visit: Payer: Medicare HMO

## 2021-06-11 ENCOUNTER — Ambulatory Visit (INDEPENDENT_AMBULATORY_CARE_PROVIDER_SITE_OTHER): Payer: Medicare HMO | Admitting: Pharmacist

## 2021-06-11 DIAGNOSIS — I10 Essential (primary) hypertension: Secondary | ICD-10-CM

## 2021-06-11 DIAGNOSIS — E118 Type 2 diabetes mellitus with unspecified complications: Secondary | ICD-10-CM

## 2021-06-11 NOTE — Patient Instructions (Signed)
Hi Joel [name],  It was great to get to meet you over the telephone! Below is a summary of some of the topics we discussed.   Don't forget to try the ferrous gluconate a couple times a week to see if you can tolerate this time of iron.  Also, don't forget to go ahead and get your shingles shot at the pharmacy as soon as you can!  Please reach out to me if you have any questions or need anything!  Best, Maddie  [name], PharmD, [facility] [phone]   Visit Information   Goals Addressed             This Visit's Progress    Track and Manage My Blood Pressure-Hypertension       Timeframe:  Long-Range Goal Priority:  High Start Date:                             Expected End Date:                       Follow Up Date 09/12/21    - check blood pressure 3 times per week - choose a place to take my blood pressure (home, clinic or office, retail store) - write blood pressure results in a log or diary    Why is this important?   You won't feel high blood pressure, but it can still hurt your blood vessels.  High blood pressure can cause heart or kidney problems. It can also cause a stroke.  Making lifestyle changes like losing a little weight or eating less salt will help.  Checking your blood pressure at home and at different times of the day can help to control blood pressure.  If the doctor prescribes medicine remember to take it the way the doctor ordered.  Call the office if you cannot afford the medicine or if there are questions about it.     Notes:        Patient Care Plan: CCM Pharmacy Care Plan     Problem Identified: Problem: Hypertension, Hyperlipidemia, Diabetes, Coronary Artery Disease, GERD, COPD, and Anemia      Long-Range Goal: Patient-Specific Goal   Start Date: 06/11/2021  Expected End Date: 06/12/2022  This Visit's Progress: On track  Priority: High  Note:   Current Barriers:  Unable to independently  monitor therapeutic efficacy Unable to achieve control of blood pressure   Pharmacist Clinical Goal(s):  Patient will achieve adherence to monitoring guidelines and medication adherence to achieve therapeutic efficacy achieve control of blood pressure as evidenced by home and office readings  through collaboration with PharmD and provider.   Interventions: 1:1 collaboration with Irene Pap, PA-C regarding development and update of comprehensive plan of care as evidenced by provider attestation and co-signature Inter-disciplinary care team collaboration (see longitudinal plan of care) Comprehensive medication review performed; medication list updated in electronic medical record  Hypertension (BP goal <130/80) -Not ideally controlled -Current treatment: Amlodipine 10 mg 1 tablet daily - Appropriate, Query effective, Safe, Accessible -Medications previously tried: losartan-HCTZ  -Current home readings: 146/72 HR 70, 148/73 HR 82, 145/70 HR 60, 145/77 HR 70, 131/74, 161/80, 155/86; checking almost every morning with arm cuff for 2 years -Current dietary habits: don't pay attention to salt; uses garlic salt - doesn't eat out often and does his own cooking; no frozen meals; makes his own beef stews and uses canned  vegetables -Current exercise habits: minimal exercise with leg pain -Denies hypotensive/hypertensive symptoms -Educated on BP goals and benefits of medications for prevention of heart attack, stroke and kidney damage; Importance of home blood pressure monitoring; Proper BP monitoring technique; Symptoms of hypotension and importance of maintaining adequate hydration; -Counseled to monitor BP at home weekly, document, and provide log at future appointments -Counseled on diet and exercise extensively Recommended to continue current medication  Hyperlipidemia: (LDL goal < 55) -Controlled -Current treatment: Pravastatin 40 mg 1 tablet daily - PM -  Appropriate, Effective,  Safe, Accessible -Medications previously tried: none  -Current dietary patterns: limits sweets -Current exercise habits: minimal -Educated on Cholesterol goals;  Benefits of statin for ASCVD risk reduction; Importance of limiting foods high in cholesterol; Exercise goal of 150 minutes per week; -Counseled on diet and exercise extensively Recommended to continue current medication  CAD (Goal: prevent heart events) -Controlled -Current treatment  Pravastatin 40 mg 1 tablet daily - Appropriate, Effective, Safe, Accessible Aspirin 81 mg 1 tablet daily - Appropriate, Effective, Safe, Accessible Nitroglycerin 0.4 mg SL 1 tablet as needed - Appropriate, Effective, Safe, Accessible Isosorbide mononitrate 60 mg 1 tablet daily - Appropriate, Effective, Safe, Accessible -Medications previously tried: none  -Recommended to continue current medication  Diabetes (A1c goal <7%) -Controlled -Current medications: No medications -Medications previously tried: none  -Current home glucose readings fasting glucose: does not check post prandial glucose: does not check -Denies hypoglycemic/hyperglycemic symptoms -Current meal patterns:  breakfast: n/a  lunch: n/a  dinner: n/a snacks: n/a drinks: n/a -Current exercise: n/a -Educated on A1c and blood sugar goals; Carbohydrate counting and/or plate method -Counseled to check feet daily and get yearly eye exams -Counseled on diet and exercise extensively  COPD (Goal: control symptoms and prevent exacerbations) -Controlled -Current treatment  Albuterol HFA as needed - Appropriate, Effective, Safe, Accessible -Medications previously tried: none  -Gold Grade: Gold 1 (FEV1>80%) -Current COPD Classification:  A (low sx, <2 exacerbations/yr) -MMRC/CAT score: n/a -Pulmonary function testing: 07/03/20 -Exacerbations requiring treatment in last 6 months: none -Patient denies consistent use of maintenance inhaler -Frequency of rescue inhaler use: not  often -Counseled on When to use rescue inhaler -Recommended to continue current medication  Prostate cancer (Goal: remission) -Controlled -Current treatment  Zytiga 250 mg 1 tablet daily - Appropriate, Effective, Safe, Accessible Prednisone 5 mg 1 tablet daily - Appropriate, Effective, Safe, Accessible -Medications previously tried: none  -Recommended taking prednisone with a meal.  Pain (Goal: minimize pain) -Controlled -Current treatment  Tramadol 50 mg 1 tablet as needed - Appropriate, Effective, Safe, Accessible Acetaminophen 500 mg 1 tablet as needed - Appropriate, Effective, Safe, Accessible -Medications previously tried: none  -Recommended to continue current medication  Headaches (Goal: minimize pain) -Controlled -Current treatment  Gabapentin 600 mg 1 tablet twice daily - Appropriate, Effective, Safe, Accessible -Medications previously tried: none  -Recommended to continue current medication  Anemia (Goal: HgB > 11) -Not ideally controlled -Current treatment  Ferrous sulfate 325 mg 1 tablet daily  - not taking  -Medications previously tried: none  -Recommended trial of ferrous gluconate a few days a week to see if he tolerates.  BPH (Goal: minimize symptoms) -Controlled -Current treatment  Tamsulosin 0.4 mg 1 capsule daily - Appropriate, Effective, Safe, Accessible -Medications previously tried: none  -Recommended to continue current medication  GERD (Goal: minimize heartburn symptoms) -Controlled -Current treatment  Omeprazole 20 mg 1 capsule twice daily - Appropriate, Effective, Safe, Accessible -Medications previously tried: none  -Recommended using this medication the least  amount possible and if not having symptoms, does not need to use twice daily.    Health Maintenance -Vaccine gaps: shingrix -Current therapy:  Loratadine D 10 mg 1 tablet daily as needed  Latanoprost 0.05% 1 drop in both eyes at bedtime Brimonidine 0.2% 1 drop in both eyes twice  daily Docusate 100 mg 1 tablet twice daily Voltaren gel 1% as needed twice daily Senna as needed Miralax as needed -Educated on Herbal supplement research is limited and benefits usually cannot be proven Cost vs benefit of each product must be carefully weighed by individual consumer -Patient is satisfied with current therapy and denies issues -Recommended minimizing products with Sudafed to avoid elevated blood pressure.  Patient Goals/Self-Care Activities Patient will:  - take medications as prescribed as evidenced by patient report and record review check blood pressure weekly, document, and provide at future appointments target a minimum of 150 minutes of moderate intensity exercise weekly  Follow Up Plan: The care management team will reach out to the patient again over the next 30 days.       [name] was given information about Chronic Care Management services today including:  CCM service includes personalized support from designated clinical staff supervised by his physician, including individualized plan of care and coordination with other care providers 24/7 contact phone numbers for assistance for urgent and routine care needs. Standard insurance, coinsurance, copays and deductibles apply for chronic care management only during months in which we provide at least 20 minutes of these services. Most insurances cover these services at 100%, however patients may be responsible for any copay, coinsurance and/or deductible if applicable. This service may help you avoid the need for more expensive face-to-face services. Only one practitioner may furnish and bill the service in a calendar month. The patient may stop CCM services at any time (effective at the end of the month) by phone call to the office staff.  Patient agreed to services and verbal consent obtained.   The patient verbalized understanding of instructions, educational materials, and care plan provided today and  agreed to receive a mailed copy of patient instructions, educational materials, and care plan.  The pharmacy team will reach out to the patient again over the next 30 days.   Viona Gilmore, [facility]

## 2021-06-11 NOTE — Progress Notes (Signed)
Chronic Care Management Pharmacy Note  06/11/2021 Name:  Joel [name] MRN:  [id number] DOB:  08-16-1940  Summary: BP not at goal per home readings LDL at goal < 55  Recommendations/Changes made from today's visit: -Recommended bringing BP cuff to office to ensure accuracy -Recommended trial of ferrous gluconate a few days a week to see how patient tolerates -Recommended taking prednisone with food rather than on an empty stomach -Recommended minimizing products with Sudafed (Claritin D) to avoid elevated blood pressure  Plan: Front office to schedule PCP appt Follow up on iron tolerance and BP assessment in 1 month Follow up in 6 months  Subjective: Joel [name] is an 81 y.o. year old male who is a primary patient of Marcellina Millin.  The CCM team was consulted for assistance with disease management and care coordination needs.    Engaged with patient by telephone for initial visit in response to provider referral for pharmacy case management and/or care coordination services.   Consent to Services:  The patient was given the following information about Chronic Care Management services today, agreed to services, and gave verbal consent: 1. CCM service includes personalized support from designated clinical staff supervised by the primary care provider, including individualized plan of care and coordination with other care providers 2. 24/7 contact phone numbers for assistance for urgent and routine care needs. 3. Service will only be billed when office clinical staff spend 20 minutes or more in a month to coordinate care. 4. Only one practitioner may furnish and bill the service in a calendar month. 5.The patient may stop CCM services at any time (effective at the end of the month) by phone call to the office staff. 6. The patient will be responsible for cost sharing (co-pay) of up to 20% of the service fee (after annual deductible is met). Patient agreed to services and  consent obtained.  Patient Care Team: Marcellina Millin as PCP - General (Physician Assistant) Burnell Blanks, MD as PCP - Cardiology (Cardiology) Burnell Blanks, MD as Consulting Physician (Cardiology) Clinic, Jennefer Bravo, [facility] as Pharmacist (Pharmacist)  Recent office visits: 05/29/21 [name], LPN - Patient presented for Medicare Annual Wellness Exam. No medication changes.  01/16/21 [name], PA-C: Patient presented for fatigue and nasal congestion. Influenza vaccine administered. Recommended follow up with GI provider at the [facility].  Recent consult visits: 05/19/21 [name] - Patient presented for Essential hypertension and other concerns. No other visit details available/   05/12/21 [name] - Patient presented for constipation unspecified. No other visit details available/   05/11/21 Edrick Kins, DPM ( Podiatry) - Patient presented for Edema of left foot. No medication changes.   04/23/21 Czinsky, Stephani Police - Patient presented for Intervertebral disc disorders and other concerns.   04/22/21 STREER,NATHAN P ([place]) - No other visit details available   03/30/21 Zonia Kief (Phys Med) - Patient presented for Lumbar Radiculopathy. No other visit details available.   03/27/21 Burnell Blanks, MD ( Cardiology) - Patient presented for Coronary artery disease of native artery and other concerns. No medication changes.   03/26/21 Angelia Mould, MD (Vasc Surg) - Patient presented for Abdominal aortic aneurysm without rupture and other concerns. No medication changes.   03/25/21 Streer Ovid Curd P ( [place]) - Patient presented for Malignant neoplasm of prostate and other concerns. No other visit details available  Hospital visits: Medication Reconciliation was completed by comparing discharge summary, patients  EMR and Pharmacy list, and upon discussion with patient.   Patient presented to [name] ED on 04/14/21 due to Chest pain at rest. Patient was present for 10 hours.   New?Medications Started at [facility] Discharge:?? -started  none   Medication Changes at Hospital Discharge: -Changed  none   Medications Discontinued at Hospital Discharge: -Stopped  none   Medications that remain the same after Hospital Discharge:??  -All other medications will remain the same.     Medication Reconciliation was completed by comparing discharge summary, patients EMR and Pharmacy list, and upon discussion with patient.   Patient presented to [name] on 03/23/21 due to diziness. Patient was present for 1 hour   New?Medications Started at [facility] Discharge:?? -started  none   Medication Changes at Hospital Discharge: -Changed  none   Medications Discontinued at Hospital Discharge: -Stopped  none   Medications that remain the same after Hospital Discharge:??  -All other medications will remain the same.    Objective:  Lab Results  Component Value Date   CREATININE 1.01 04/14/2021   BUN 10 04/14/2021   GFR 82.50 07/27/2013   EGFR 80 01/16/2021   GFRNONAA >60 04/14/2021   GFRAA 64 02/14/2020   NA 138 04/14/2021   K 3.6 04/14/2021   CALCIUM 9.0 04/14/2021   CO2 28 04/14/2021   GLUCOSE 143 (H) 04/14/2021    Lab Results  Component Value Date/Time   HGBA1C 6.1 (H) 01/16/2021 11:43 AM   HGBA1C 6.6 (H) 07/17/2020 12:02 PM   GFR 82.50 07/27/2013 09:29 AM   GFR 70.93 06/29/2012 01:04 PM   MICROALBUR <0.2 03/22/2016 11:40 AM    Last diabetic Eye exam:  Lab Results  Component Value Date/Time   HMDIABEYEEXA Retinopathy (A) 07/02/2020 12:00 AM    Last diabetic Foot exam: No results found for: HMDIABFOOTEX   Lab Results  Component Value Date   CHOL 115 09/03/2020   HDL 39 (L) 09/03/2020   LDLCALC 49 09/03/2020   TRIG 158 (H) 09/03/2020   CHOLHDL 2.9 09/03/2020    Hepatic Function Latest Ref Rng & Units 01/16/2021 12/11/2020  09/03/2020  Total Protein 6.0 - 8.5 g/dL 6.2 6.3 6.3  Albumin 3.6 - 4.6 g/dL 4.2 3.8 4.2  AST 0 - 40 IU/L _0 ALT 0 - 44 IU/L _1 Alk Phosphatase 44 - 121 IU/L 101 101 92  Total Bilirubin 0.0 - 1.2 mg/dL 0.2 <0.2 <0.2  Bilirubin, Direct 0.00 - 0.40 mg/dL - - 0.10    Lab Results  Component Value Date/Time   TSH 1.620 07/17/2020 12:02 PM   TSH 3.53 12/18/2015 01:33 PM    CBC Latest Ref Rng & Units 04/14/2021 03/23/2021 01/16/2021  WBC 4.0 - 10.5 K/uL 6.0 6.1 6.1  Hemoglobin 13.0 - 17.0 g/dL 10.6(L) 11.4(L) 10.8(L)  Hematocrit 39.0 - 52.0 % 35.2(L) 37.8(L) 35.6(L)  Platelets 150 - 400 K/uL 174 219 177    No results found for: VD25OH  Clinical ASCVD: Yes  The ASCVD Risk score (Arnett DK, et al., 2019) failed to calculate for the following reasons:   The 2019 ASCVD risk score is only valid for ages 35 to 5    Depression screen PHQ 2/9 05/29/2021 07/17/2020 09/11/2018  Decreased Interest 0 0 0  Down, Depressed, Hopeless 0 0 0  PHQ - 2 Score 0 0 0  Some recent data might be hidden      Social History   Tobacco Use  Smoking Status Former   Packs/day: 0.50   Years: 52.00   Pack years: 26.00   Types: Cigarettes   Quit date: 05/26/2011   Years since quitting: 10.0   Passive exposure: Past  Smokeless Tobacco Never   BP Readings from Last 3 Encounters:  04/14/21 134/70  03/27/21 110/60  03/26/21 131/67   Pulse Readings from Last 3 Encounters:  04/14/21 (!) 55  03/27/21 62  03/26/21 (!) 56   Wt Readings from Last 3 Encounters:  05/29/21 232 lb (105.2 kg)  04/17/21 231 lb (104.8 kg)  03/27/21 231 lb (104.8 kg)   BMI Readings from Last 3 Encounters:  05/29/21 34.26 kg/m  04/17/21 34.11 kg/m  03/27/21 34.11 kg/m    Assessment/Interventions: Review of patient past medical history, allergies, medications, health status, including review of consultants reports, laboratory and other test data, was performed as part of comprehensive evaluation and provision of  chronic care management services.   SDOH:  (Social Determinants of Health) assessments and interventions performed: Yes SDOH Interventions    Flowsheet Row Most Recent Value  SDOH Interventions   Financial Strain Interventions Intervention Not Indicated  Transportation Interventions Intervention Not Indicated      SDOH Screenings   Alcohol Screen: Not on file  Depression (PHQ2-9): Low Risk    PHQ-2 Score: 0  Financial Resource Strain: Low Risk    Difficulty of Paying Living Expenses: Not hard at all  Food Insecurity: No Food Insecurity   Worried About Charity fundraiser in the Last Year: Never true   Arboriculturist in the Last Year: Never true  Housing: Not on file  Physical Activity: Inactive   Days of Exercise per Week: 0 days   Minutes of Exercise per Session: 0 min  Social Connections: Not on file  Stress: No Stress Concern Present   Feeling of Stress : Not at all  Tobacco Use: Medium Risk   Smoking Tobacco Use: Former   Smokeless Tobacco Use: Never   Passive Exposure: Past  Transportation Needs: No Transportation Needs   Lack of Transportation (Medical): No   Lack of Transportation (Non-Medical): No   Patient is retired and had a back operation so he spends most of his days piddling around the house. He just can't stay bent over but is still able to do the activities of daily living. He lives by himself in an apartment. He has 3 sisters that live close by and gets to see them almost every day. His younger sister works at [employer] and his oldest sister brought him to his back injection appointment. He also has 2 daughters and one lives in [place] and one in [place]. He reports the one in [place] calls him every day to check in on him.  Patient does drive himself everywhere except when he goes to the back doctor for injections and for the [place] for infusions and his sisters usually drive him.  Patient goes for walks but cannot walk too far and goes outside every day.  Patient walks about 10-15 minutes and it bothers his right leg if he walks too far. He rides in the carts in the grocery store or gets a basket and pushes it.  Patient sometimes sleeps too much. He falls asleep pretty quickly during the day. He doesn't feel like it's a problem with fatigue or sleeping too much. His brother and mom had a condition where they fell asleep anytime. He doesn't think he has the same thing because he doesn't  fall asleep when in conversation. He sometimes he takes a nap during the day and this depends on the weather and he sleeps more in colder weather. He watches a lot of TV and spends a lot of time on the computer.  Patient doesn't feel like he's having problems with his medications although he isn't sure what all of them are for. He uses both [facility] and the [place] for his medications.  CCM Care Plan  Allergies  Allergen Reactions   Topiramate Shortness Of Breath and Other (See Comments)     leg cramps   Atenolol Other (See Comments)    bradycardia   Lisinopril Cough   Tramadol-Acetaminophen Cough    Tolerates plain Tramadol    Medications Reviewed Today     Reviewed by Viona Gilmore, [facility] (Pharmacist) on 06/11/21 at 1154  Med List Status: <None>   Medication Order Taking? Sig Documenting Provider Last Dose Status Informant  abiraterone acetate (ZYTIGA) 250 MG tablet [id number] Yes Take 250 mg by mouth daily. [provider] Taking Active Multiple Informants  Accu-Chek FastClix Lancets MISC [id number]  Test once daily [place], [place] L, [place]  Active Multiple Informants  albuterol (VENTOLIN HFA) 108 (90 Base) MCG/ACT inhaler [id number] Yes INHALE 2 PUFFS INTO THE LUNGS EVERY 6 HOURS AS NEEDED FOR WHEEZING OR SHORTNESS OF BREATH  Patient taking differently: 2 puffs every 6 (six) hours as needed for shortness of breath.   Henson, Vickie L, PA-C Taking Active Multiple Informants  amLODipine (NORVASC) 10 MG tablet [id number] Yes Take 1 tablet (10 mg total) by  mouth daily. Burnell Blanks, MD Taking Active Multiple Informants  aspirin EC 81 MG tablet [id number] Yes Take 81 mg by mouth daily.  [provider] Taking Active Multiple Informants  brimonidine (ALPHAGAN) 0.2 % ophthalmic solution [id number] Yes Place 1 drop into both eyes 2 (two) times daily. [provider] Taking Active Multiple Informants  diclofenac Sodium (VOLTAREN) 1 % GEL [id number] Yes Apply 2 g topically 2 (two) times daily as needed (back pain). [provider] Taking Active Multiple Informants  ferrous sulfate 325 (65 FE) MG tablet [id number] No Take 325 mg by mouth 2 (two) times daily with a meal.  Patient not taking: Reported on 06/11/2021   [provider] Not Taking Active Multiple Informants  gabapentin (NEURONTIN) 600 MG tablet [id number] Yes Take 600 mg by mouth 2 (two) times daily. [provider] Taking Active Multiple Informants  isosorbide mononitrate (IMDUR) 60 MG 24 hr tablet [id number] Yes TAKE 1 TABLET(60 MG) BY MOUTH DAILY  Patient taking differently: 60 mg daily.   Burnell Blanks, MD Taking Active Multiple Informants  latanoprost (XALATAN) 0.005 % ophthalmic solution [id number] Yes Place 1 drop into both eyes at bedtime. [provider] Taking Active Multiple Informants  LORATADINE PO [id number]  Take 10 mg by mouth daily as needed for allergies. [provider]  Active Multiple Informants  nitroGLYCERIN (NITROSTAT) 0.4 MG SL tablet [id number] Yes Place 1 tablet (0.4 mg total) under the tongue every 5 (five) minutes as needed for chest pain (max 3 doses).  Patient taking differently: Place 0.4 mg under the tongue every 5 (five) minutes as needed for chest pain.   Burnell Blanks, MD Taking Active Multiple Informants  omeprazole (PRILOSEC) 20 MG capsule [id number] Yes Take 20 mg by mouth in the morning and at bedtime. [provider] Taking Active Multiple Informants  pravastatin  (PRAVACHOL) 40 MG tablet [id number] Yes Take 1 tablet (40 mg total)  by mouth every evening. Burnell Blanks, MD Taking Active Multiple Informants  predniSONE (DELTASONE) 5 MG tablet [id number] Yes Take 5 mg by mouth daily. [provider] Taking Active Multiple Informants  senna (SENOKOT) 8.6 MG TABS tablet [id number] Yes Take 1 tablet by mouth. [provider] Taking Active   tamsulosin (FLOMAX) 0.4 MG CAPS capsule [id number] Yes Take 0.4 mg by mouth at bedtime. [provider] Taking Active Multiple Informants  traMADol (ULTRAM) 50 MG tablet [id number] Yes TAKE 1 TABLET(50 MG) BY MOUTH EVERY 8 HOURS AS NEEDED FOR SEVERE PAIN  Patient taking differently: Take 50 mg by mouth every 6 (six) hours as needed for moderate pain.   Denita Lung, MD Taking Active Multiple Informants            Patient Active Problem List   Diagnosis Date Noted   Constipation, unspecified 05/11/2021   Non-Hodgkin lymphoma, unspecified, unspecified site ([place]) 05/11/2021   Encounter for therapeutic drug level monitoring 05/11/2021   Alpha thalassaemia minor 12/22/2020   Benign hypertensive heart disease 12/22/2020   Contact with and (suspected) exposure to other environmental pollution 12/22/2020   Aortic atherosclerosis ([place]) 07/17/2020   Aneurysm of iliac artery ([place]) 06/13/2020   Lymphoproliferative disorder, low grade B cell (HCC)-Stage 4 05/21/2020   Chest pain 02/10/2020   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   Diabetes mellitus ([place]) 09/07/2019   Carcinoma of prostate ([place]) 09/06/2019   Chest pain of uncertain etiology    Sleep related hypoxia 04/03/2019   Obesity (BMI 30-39.9) 04/03/2019   Snoring 04/03/2019   At risk for sleep apnea 01/10/2019   Excessive daytime sleepiness 01/10/2019   Controlled type 2 diabetes mellitus with hyperglycemia, without long-term current use of insulin ([place]) 12/11/2018   Hypoxic encephalopathy ([place])  12/11/2018   Obesity hypoventilation syndrome ([place]) 12/11/2018   Chronic low back pain 09/11/2018   COPD (chronic obstructive pulmonary disease) (HCC)    Seasonal allergies 07/04/2018   Chronic obstructive pulmonary disease ([place]) 07/04/2018   Pulsatile tinnitus 02/17/2018   Dizziness 01/20/2018   Malaise 01/20/2018   Decreased breath sounds 01/20/2018   Needs flu shot 01/20/2018   Chronic nonintractable headache 12/07/2017   Back pain 11/01/2017   Radicular pain of lower extremity 11/01/2017   Prostate cancer ([place]) 11/01/2017   Family history of breast cancer in first degree relative    Personal history of prostate cancer    Adenomatous polyp of colon 04/12/2017   Elliptocytosis on peripheral blood smear ([place]) 09/13/2016   Prediabetes 03/22/2016   Anemia due to chronic blood loss 04/12/2014   PVD (peripheral vascular disease) with claudication ([place]) 11/15/2012   Chest pain, localized 11/10/2012   Hypokalemia 11/10/2012   Bradycardia 11/09/2012   Thrombocytopenia ([place]) 06/01/2012   Microcytic anemia    Atherosclerosis of native artery of extremity with intermittent claudication ([place]) 06/16/2011   Stable angina ([place]) 06/11/2011   CAD (coronary artery disease)    Chest pain, atypical 05/30/2011   Iron deficiency anemia 10/03/2009   DIVERTICULOSIS OF COLON 10/03/2009   WEIGHT LOSS, ABNORMAL 10/03/2009   CHEST PAIN UNSPECIFIED 09/13/2008   Headache(784.0) 08/19/2008   TIA 06/05/2008   SHOULDER PAIN, LEFT 10/11/2007   COUGH DUE TO ACE INHIBITORS 05/07/2007   History of tobacco use disorder 04/25/2007   Hyperlipidemia 04/21/2007   Essential hypertension 04/21/2007   GERD 04/21/2007    Immunization History  Administered Date(s) Administered   Fluad Quad(high Dose 65+) 01/10/2019, 01/16/2021   Influenza, High Dose  Seasonal PF 02/08/2017, 01/20/2018   Influenza-Unspecified 01/23/2020   Moderna Sars-Covid-2 Vaccination 05/16/2019, 06/13/2019, 02/12/2020, 07/21/2020   Pfizer  Covid-19 Vaccine Bivalent Booster 82yr & up 02/24/2021   Pneumococcal Conjugate-13 08/21/2019   Pneumococcal Polysaccharide-23 01/14/2011   Pneumococcal-Unspecified 01/23/2020   Tdap 12/23/2015    Conditions to be addressed/monitored:  Hypertension, Hyperlipidemia, Diabetes, Coronary Artery Disease, GERD, COPD, and Anemia  Care Plan : CCM Pharmacy Care Plan  Updates made by PViona Gilmore RPenuelassince 06/11/2021 12:00 AM     Problem: Problem: Hypertension, Hyperlipidemia, Diabetes, Coronary Artery Disease, GERD, COPD, and Anemia      Long-Range Goal: Patient-Specific Goal   Start Date: 06/11/2021  Expected End Date: 06/12/2022  This Visit's Progress: On track  Priority: High  Note:   Current Barriers:  Unable to independently monitor therapeutic efficacy Unable to achieve control of blood pressure   Pharmacist Clinical Goal(s):  Patient will achieve adherence to monitoring guidelines and medication adherence to achieve therapeutic efficacy achieve control of blood pressure as evidenced by home and office readings  through collaboration with PharmD and provider.   Interventions: 1:1 collaboration with WIrene Pap PA-C regarding development and update of comprehensive plan of care as evidenced by provider attestation and co-signature Inter-disciplinary care team collaboration (see longitudinal plan of care) Comprehensive medication review performed; medication list updated in electronic medical record  Hypertension (BP goal <130/80) -Not ideally controlled -Current treatment: Amlodipine 10 mg 1 tablet daily - Appropriate, Query effective, Safe, Accessible -Medications previously tried: losartan-HCTZ  -Current home readings: 146/72 HR 70, 148/73 HR 82, 145/70 HR 60, 145/77 HR 70, 131/74, 161/80, 155/86; checking almost every morning with arm cuff for 2 years -Current dietary habits: don't pay attention to salt; uses garlic salt - doesn't eat out often and does his own  cooking; no frozen meals; makes his own beef stews and uses canned vegetables -Current exercise habits: minimal exercise with leg pain -Denies hypotensive/hypertensive symptoms -Educated on BP goals and benefits of medications for prevention of heart attack, stroke and kidney damage; Importance of home blood pressure monitoring; Proper BP monitoring technique; Symptoms of hypotension and importance of maintaining adequate hydration; -Counseled to monitor BP at home weekly, document, and provide log at future appointments -Counseled on diet and exercise extensively Recommended to continue current medication  Hyperlipidemia: (LDL goal < 55) -Controlled -Current treatment: Pravastatin 40 mg 1 tablet daily - PM -  Appropriate, Effective, Safe, Accessible -Medications previously tried: none  -Current dietary patterns: limits sweets -Current exercise habits: minimal -Educated on Cholesterol goals;  Benefits of statin for ASCVD risk reduction; Importance of limiting foods high in cholesterol; Exercise goal of 150 minutes per week; -Counseled on diet and exercise extensively Recommended to continue current medication  CAD (Goal: prevent heart events) -Controlled -Current treatment  Pravastatin 40 mg 1 tablet daily - Appropriate, Effective, Safe, Accessible Aspirin 81 mg 1 tablet daily - Appropriate, Effective, Safe, Accessible Nitroglycerin 0.4 mg SL 1 tablet as needed - Appropriate, Effective, Safe, Accessible Isosorbide mononitrate 60 mg 1 tablet daily - Appropriate, Effective, Safe, Accessible -Medications previously tried: none  -Recommended to continue current medication  Diabetes (A1c goal <7%) -Controlled -Current medications: No medications -Medications previously tried: none  -Current home glucose readings fasting glucose: does not check post prandial glucose: does not check -Denies hypoglycemic/hyperglycemic symptoms -Current meal patterns:  breakfast: n/a  lunch: n/a   dinner: n/a snacks: n/a drinks: n/a -Current exercise: n/a -Educated on A1c and blood sugar goals; Carbohydrate counting and/or plate  method -Counseled to check feet daily and get yearly eye exams -Counseled on diet and exercise extensively  COPD (Goal: control symptoms and prevent exacerbations) -Controlled -Current treatment  Albuterol HFA as needed - Appropriate, Effective, Safe, Accessible -Medications previously tried: none  -Gold Grade: Gold 1 (FEV1>80%) -Current COPD Classification:  A (low sx, <2 exacerbations/yr) -MMRC/CAT score: n/a -Pulmonary function testing: 07/03/20 -Exacerbations requiring treatment in last 6 months: none -Patient denies consistent use of maintenance inhaler -Frequency of rescue inhaler use: not often -Counseled on When to use rescue inhaler -Recommended to continue current medication  Prostate cancer (Goal: remission) -Controlled -Current treatment  Zytiga 250 mg 1 tablet daily - Appropriate, Effective, Safe, Accessible Prednisone 5 mg 1 tablet daily - Appropriate, Effective, Safe, Accessible -Medications previously tried: none  -Recommended taking prednisone with a meal.  Pain (Goal: minimize pain) -Controlled -Current treatment  Tramadol 50 mg 1 tablet as needed - Appropriate, Effective, Safe, Accessible Acetaminophen 500 mg 1 tablet as needed - Appropriate, Effective, Safe, Accessible -Medications previously tried: none  -Recommended to continue current medication  Headaches (Goal: minimize pain) -Controlled -Current treatment  Gabapentin 600 mg 1 tablet twice daily - Appropriate, Effective, Safe, Accessible -Medications previously tried: none  -Recommended to continue current medication  Anemia (Goal: HgB > 11) -Not ideally controlled -Current treatment  Ferrous sulfate 325 mg 1 tablet daily  - not taking  -Medications previously tried: none  -Recommended trial of ferrous gluconate a few days a week to see if he  tolerates.  BPH (Goal: minimize symptoms) -Controlled -Current treatment  Tamsulosin 0.4 mg 1 capsule daily - Appropriate, Effective, Safe, Accessible -Medications previously tried: none  -Recommended to continue current medication  GERD (Goal: minimize heartburn symptoms) -Controlled -Current treatment  Omeprazole 20 mg 1 capsule twice daily - Appropriate, Effective, Safe, Accessible -Medications previously tried: none  -Recommended using this medication the least amount possible and if not having symptoms, does not need to use twice daily.    Health Maintenance -Vaccine gaps: shingrix -Current therapy:  Loratadine D 10 mg 1 tablet daily as needed  Latanoprost 0.05% 1 drop in both eyes at bedtime Brimonidine 0.2% 1 drop in both eyes twice daily Docusate 100 mg 1 tablet twice daily Voltaren gel 1% as needed twice daily Senna as needed Miralax as needed -Educated on Herbal supplement research is limited and benefits usually cannot be proven Cost vs benefit of each product must be carefully weighed by individual consumer -Patient is satisfied with current therapy and denies issues -Recommended minimizing products with Sudafed to avoid elevated blood pressure.  Patient Goals/Self-Care Activities Patient will:  - take medications as prescribed as evidenced by patient report and record review check blood pressure weekly, document, and provide at future appointments target a minimum of 150 minutes of moderate intensity exercise weekly  Follow Up Plan: The care management team will reach out to the patient again over the next 30 days.       Medication Assistance: None required.  Patient affirms current coverage meets needs.  Compliance/Adherence/Medication fill history: Care Gaps: Shingrix, foot exam BP- 110/60 (03/27/21) Last A1c: 6.1% (01/16/21)  Star-Rating Drugs: Pravastatin 40 mg - Last filled 04/25/21 90 DS at [facility]  Patient's preferred pharmacy is:  [facility] [place], [address] AT [address] [place] [address] Phone: [phone] Fax: [phone]  [facility] [phone] - [place], [place] - [address] [address] Phone: [phone] Fax: [phone]  Uses pill box? No - sitting in cabinet Pt endorses 99% compliance  We discussed: Current pharmacy is preferred with insurance plan and patient is satisfied with pharmacy services Patient decided to: Continue current medication management strategy  Care Plan and Follow Up Patient Decision:  Patient agrees to Care Plan and Follow-up.  Plan: The care management team will reach out to the patient again over the next 30 days.  [name], PharmD, [facility] [phone]

## 2021-06-14 NOTE — Progress Notes (Signed)
Acute Office Visit  Subjective:    [name], male    DOB: 14-Oct-1940, 81 y.o.   MRN: [id number]  Chief Complaint  Patient presents with   Foot Swelling    Left foot swelling. Medication refill.    HPI  Patient is in today for a follow up appointment; reports left ankle swelling for 2 months; sits with legs hanging down a lot; reports that he doesn't eat a lot of salt and drinks water daily; denies gout; + surgical history of left vein bypass graft (aorto-fem-pop) 09/2009, + peripheral vascular disease; denies recent injury or fall.    Past Medical History:  Diagnosis Date   Atypical chest pain    CAD (coronary artery disease)    a. Multiple prior evaluations then 05/2012 s/p BMS to OM2. b. Nuc 11/2012 wnl. c. Cath 2015: patent stent, moderate LAD/RCA dz, treated medically.   Cataract    CKD (chronic kidney disease), stage II    COPD (chronic obstructive pulmonary disease) (HCC)    Cough secondary to angiotensin converting enzyme inhibitor (ACE-I)    Diabetes mellitus ([place]) 09/07/2019   Diabetes mellitus without complication (HCC)    Diverticulosis    DVT (deep venous thrombosis) (HCC)    Family history of breast cancer in first degree relative    GERD (gastroesophageal reflux disease)    Headache(784.0)    Hx: of   History of blood transfusion    "I've had 2; don't know what it was related to" (11/09/2012)   HLD (hyperlipidemia)    HTN (hypertension)    Iron deficiency anemia    Lymphoproliferative disorder, low grade B cell ([place]) 05/21/2020   Microcytic anemia    Peripheral vascular disease ([place])    a. L pop-tibial bypass 2011, followed by [facility].   Personal history of prostate cancer    Rotator cuff injury    "left arm; never repaired" (11/09/2012)    Past Surgical History:  Procedure Laterality Date   CARDIOVASCULAR STRESS TEST  Aug. 2014   CATARACT EXTRACTION W/ INTRAOCULAR LENS IMPLANT Right ~ 2008   CIRCUMCISION     COLONOSCOPY     CORONARY  ANGIOPLASTY WITH STENT PLACEMENT  2014   "1" (11/09/2012)   ILIAC ARTERY ANEURYSM REPAIR     LEFT HEART CATH  05-31-12   LEFT HEART CATH AND CORONARY ANGIOGRAPHY N/A 08/01/2019   Procedure: LEFT HEART CATH AND CORONARY ANGIOGRAPHY;  Surgeon: Burnell Blanks, MD;  Location: [place] [facility];  Service: Cardiovascular;  Laterality: N/A;   LEFT HEART CATHETERIZATION WITH CORONARY ANGIOGRAM N/A 06/01/2011   Procedure: LEFT HEART CATHETERIZATION WITH CORONARY ANGIOGRAM;  Surgeon: Hillary Bow, MD;  Location: [facility] CATH LAB;  Service: Cardiovascular;  Laterality: N/A;   LEFT HEART CATHETERIZATION WITH CORONARY ANGIOGRAM N/A 05/31/2012   Procedure: LEFT HEART CATHETERIZATION WITH CORONARY ANGIOGRAM;  Surgeon: Peter M Martinique, MD;  Location: [facility] CATH LAB;  Service: Cardiovascular;  Laterality: N/A;   LEFT HEART CATHETERIZATION WITH CORONARY ANGIOGRAM N/A 08/01/2013   Procedure: LEFT HEART CATHETERIZATION WITH CORONARY ANGIOGRAM;  Surgeon: Burnell Blanks, MD;  Location: [facility] CATH LAB;  Service: Cardiovascular;  Laterality: N/A;   PERCUTANEOUS CORONARY STENT INTERVENTION (PCI-S)  05/31/2012   Procedure: PERCUTANEOUS CORONARY STENT INTERVENTION (PCI-S);  Surgeon: Peter M Martinique, MD;  Location: [facility] CATH LAB;  Service: Cardiovascular;;   PR VEIN BYPASS GRAFT,AORTO-FEM-POP Left 10/01/2009   left below knee popliteal artery to posterior tibial artery   SHOULDER ARTHROSCOPY WITH OPEN ROTATOR  CUFF REPAIR AND DISTAL CLAVICLE ACROMINECTOMY Left 01/12/2013   Procedure: LEFT SHOULDER ARTHROSCOPY WITH DEBRIDEMENT OPEN DISTAL CLAVICLE RESECTION ,acromioplastyAND ROTATOR CUFF REPAIR;  Surgeon: Yvette Rack., MD;  Location: [place];  Service: Orthopedics;  Laterality: Left;    Family History  Problem Relation Age of Onset   Breast cancer Mother        dx 34s   Hyperlipidemia Mother    Hypertension Mother    COPD Father        deceased   Hyperlipidemia Father    Hypertension Father    Hypertension Sister     Cancer Brother 73       Unknown cancer, possibly stomach   Hypertension Brother    Cancer Brother 63       Unknown, possibly lung   Hypertension Daughter    Hypertension Son    Colon cancer Neg Hx        pt is unclear about family hx    Social History   Socioeconomic History   Marital status: Widowed    Spouse name: Not on file   Number of children: Not on file   Years of education: Not on file   Highest education level: 12th grade  Occupational History   Occupation: Retired  Tobacco Use   Smoking status: Former    Packs/day: 0.50    Years: 52.00    Pack years: 26.00    Types: Cigarettes    Quit date: 05/26/2011    Years since quitting: 10.0    Passive exposure: Past   Smokeless tobacco: Never  Vaping Use   Vaping Use: Never used  Substance and Sexual Activity   Alcohol use: No   Drug use: No   Sexual activity: Not on file  Other Topics Concern   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  Lives in [place] by himself.      Patient writes with his right hand, though uses his left hand for every thing else. He lives alone in a one level home. He drinks 3 cups of coffee a day and 3 7-8 oz sodas a day. He does not exercise.   Social Determinants of Health   Financial Resource Strain: Low Risk    Difficulty of Paying Living Expenses: Not hard at all  Food Insecurity: No Food Insecurity   Worried About Charity fundraiser in the Last Year: Never true   [place] in the Last Year: Never true  Transportation Needs: No Transportation Needs   Lack of Transportation (Medical): No   Lack of Transportation (Non-Medical): No  Physical Activity: Inactive   Days of Exercise per Week: 0 days   Minutes of Exercise per Session: 0 min  Stress: No Stress Concern Present   Feeling of Stress : Not at all  Social Connections: Not on file  Intimate Partner Violence: Not on file    Outpatient Medications Prior to Visit  Medication  Sig Dispense Refill   abiraterone acetate (ZYTIGA) 250 MG tablet Take 250 mg by mouth daily.     Accu-Chek FastClix Lancets MISC Test once daily 102 each 12   albuterol (VENTOLIN HFA) 108 (90 Base) MCG/ACT inhaler INHALE 2 PUFFS INTO THE LUNGS EVERY 6 HOURS AS NEEDED FOR WHEEZING OR SHORTNESS OF BREATH (Patient taking differently: 2 puffs every 6 (six) hours as needed for shortness of breath.) 54 g 0   amLODipine (NORVASC) 10 MG tablet Take  1 tablet (10 mg total) by mouth daily. 90 tablet 2   aspirin EC 81 MG tablet Take 81 mg by mouth daily.      brimonidine (ALPHAGAN) 0.2 % ophthalmic solution Place 1 drop into both eyes 2 (two) times daily.     ferrous sulfate 325 (65 FE) MG tablet Take 325 mg by mouth 2 (two) times daily with a meal.     gabapentin (NEURONTIN) 600 MG tablet Take 600 mg by mouth 2 (two) times daily.     gabapentin (NEURONTIN) 600 MG tablet Take 1 tablet by mouth 2 (two) times daily.     isosorbide mononitrate (IMDUR) 60 MG 24 hr tablet TAKE 1 TABLET(60 MG) BY MOUTH DAILY (Patient taking differently: 60 mg daily.) 90 tablet 1   latanoprost (XALATAN) 0.005 % ophthalmic solution Place 1 drop into both eyes at bedtime.     LORATADINE PO Take 10 mg by mouth daily as needed for allergies.     nitroGLYCERIN (NITROSTAT) 0.4 MG SL tablet Place 1 tablet (0.4 mg total) under the tongue every 5 (five) minutes as needed for chest pain (max 3 doses). (Patient taking differently: Place 0.4 mg under the tongue every 5 (five) minutes as needed for chest pain.) 25 tablet 3   omeprazole (PRILOSEC) 20 MG capsule Take 20 mg by mouth in the morning and at bedtime.     pravastatin (PRAVACHOL) 40 MG tablet Take 1 tablet (40 mg total) by mouth every evening. 90 tablet 3   predniSONE (DELTASONE) 5 MG tablet Take 5 mg by mouth daily.     tamsulosin (FLOMAX) 0.4 MG CAPS capsule Take 0.4 mg by mouth at bedtime.     traMADol (ULTRAM) 50 MG tablet TAKE 1 TABLET(50 MG) BY MOUTH EVERY 8 HOURS AS NEEDED FOR  SEVERE PAIN (Patient taking differently: Take 50 mg by mouth every 6 (six) hours as needed for moderate pain.) 30 tablet 0   diclofenac Sodium (VOLTAREN) 1 % GEL Apply 2 g topically 2 (two) times daily as needed (back pain). (Patient not taking: Reported on 06/15/2021)     loratadine (CLARITIN) 10 MG tablet Take by mouth daily.     metFORMIN (GLUCOPHAGE) 500 MG tablet Take 500 mg by mouth 2 (two) times daily.     senna (SENOKOT) 8.6 MG TABS tablet Take 1 tablet by mouth. (Patient not taking: Reported on 06/15/2021)     No facility-administered medications prior to visit.    Allergies  Allergen Reactions   Topiramate Shortness Of Breath and Other (See Comments)     leg cramps   Atenolol Other (See Comments)    bradycardia   Lisinopril Cough   Tramadol-Acetaminophen Cough    Tolerates plain Tramadol    Review of Systems  Constitutional:  Negative for activity change, chills, fatigue and fever.  HENT:  Negative for congestion, ear pain, hearing loss and voice change.   Eyes:  Negative for pain and redness.  Respiratory:  Negative for cough and shortness of breath.   Cardiovascular:  Positive for leg swelling. Negative for chest pain and palpitations.  Gastrointestinal:  Negative for constipation, diarrhea, nausea and vomiting.  Endocrine: Negative for polyuria.  Genitourinary:  Negative for flank pain and frequency.  Musculoskeletal:  Negative for joint swelling and neck pain.  Skin:  Negative for rash.  Neurological:  Negative for dizziness.  Hematological:  Does not bruise/bleed easily.  Psychiatric/Behavioral:  Negative for agitation and behavioral problems.       Objective:  Physical Exam Vitals and nursing note reviewed.  Constitutional:      General: He is not in acute distress.    Appearance: Normal appearance.  HENT:     Head: Normocephalic and atraumatic.     Right Ear: External ear normal.     Left Ear: External ear normal.     Nose: No congestion.  Eyes:      Extraocular Movements: Extraocular movements intact.     Conjunctiva/sclera: Conjunctivae normal.     Pupils: Pupils are equal, round, and reactive to light.  Cardiovascular:     Rate and Rhythm: Normal rate and regular rhythm.     Pulses: Normal pulses.     Heart sounds: Normal heart sounds.  Pulmonary:     Effort: Pulmonary effort is normal.     Breath sounds: Normal breath sounds. No wheezing.  Abdominal:     General: Bowel sounds are normal.     Palpations: Abdomen is soft.  Musculoskeletal:        General: Normal range of motion.     Cervical back: Normal range of motion and neck supple.     Right lower leg: No edema.     Left lower leg: Swelling present. No edema.     Comments: Trace non-pitting edema in left lower extremity  Skin:    General: Skin is warm and dry.     Findings: No rash.  Neurological:     Mental Status: He is alert and oriented to person, place, and time.     Gait: Gait normal.  Psychiatric:        Mood and Affect: Mood normal.        Behavior: Behavior normal.    BP (!) 110/50    Pulse 60    Wt 242 lb 9.6 oz (110 kg)    SpO2 98%    BMI 35.83 kg/m    Wt Readings from Last 3 Encounters:  06/15/21 242 lb 9.6 oz (110 kg)  05/29/21 232 lb (105.2 kg)  04/17/21 231 lb (104.8 kg)    Health Maintenance Due  Topic Date Due   URINE MICROALBUMIN  03/22/2017    There are no preventive care reminders to display for this patient.   Lab Results  Component Value Date   TSH 1.620 07/17/2020   Lab Results  Component Value Date   WBC 6.0 04/14/2021   HGB 10.6 (L) 04/14/2021   HCT 35.2 (L) 04/14/2021   MCV 71.8 (L) 04/14/2021   PLT 174 04/14/2021   Lab Results  Component Value Date   NA 138 04/14/2021   K 3.6 04/14/2021   CHLORIDE 108 11/12/2016   CO2 28 04/14/2021   GLUCOSE 143 (H) 04/14/2021   BUN 10 04/14/2021   CREATININE 1.01 04/14/2021   BILITOT 0.2 01/16/2021   ALKPHOS 101 01/16/2021   AST 15 01/16/2021   ALT 11 01/16/2021   PROT 6.2  01/16/2021   ALBUMIN 4.2 01/16/2021   CALCIUM 9.0 04/14/2021   ANIONGAP 8 04/14/2021   EGFR 80 01/16/2021   GFR 82.50 07/27/2013   Lab Results  Component Value Date   CHOL 115 09/03/2020   Lab Results  Component Value Date   HDL 39 (L) 09/03/2020   Lab Results  Component Value Date   LDLCALC 49 09/03/2020   Lab Results  Component Value Date   TRIG 158 (H) 09/03/2020   Lab Results  Component Value Date   CHOLHDL 2.9 09/03/2020   Lab Results  Component Value  Date   HGBA1C 6.1 (H) 01/16/2021       Assessment & Plan:   Problem List Items Addressed This Visit       Cardiovascular and Mediastinum   Essential hypertension - Primary   Relevant Orders   Comprehensive metabolic panel   PVD (peripheral vascular disease) with claudication (HCC)   Relevant Medications   gabapentin (NEURONTIN) 600 MG tablet     Endocrine   Controlled type 2 diabetes mellitus with hyperglycemia, without long-term current use of insulin (HCC)   Relevant Medications   metFORMIN (GLUCOPHAGE) 500 MG tablet   Other Relevant Orders   Hemoglobin A1c     Other   Hyperlipidemia   Relevant Orders   Lipid panel   Other Visit Diagnoses     Localized edema            No orders of the defined types were placed in this encounter.  Left lower leg edema - trace, patient advised to elevate legs, continue to drink 8 - 10 glasses of water a day and avoid salt; CMP, hgb a1c, and lipid profile checked today.  Return in 6 months for an annual exam.  Irene Pap, PA-C

## 2021-06-15 ENCOUNTER — Ambulatory Visit (INDEPENDENT_AMBULATORY_CARE_PROVIDER_SITE_OTHER): Payer: Medicare HMO | Admitting: Physician Assistant

## 2021-06-15 ENCOUNTER — Encounter: Payer: Self-pay | Admitting: Physician Assistant

## 2021-06-15 VITALS — BP 110/50 | HR 60 | Ht 69.0 in | Wt 242.6 lb

## 2021-06-15 DIAGNOSIS — E782 Mixed hyperlipidemia: Secondary | ICD-10-CM

## 2021-06-15 DIAGNOSIS — I739 Peripheral vascular disease, unspecified: Secondary | ICD-10-CM

## 2021-06-15 DIAGNOSIS — R600 Localized edema: Secondary | ICD-10-CM

## 2021-06-15 DIAGNOSIS — E1165 Type 2 diabetes mellitus with hyperglycemia: Secondary | ICD-10-CM | POA: Diagnosis not present

## 2021-06-15 DIAGNOSIS — I10 Essential (primary) hypertension: Secondary | ICD-10-CM | POA: Diagnosis not present

## 2021-06-16 LAB — COMPREHENSIVE METABOLIC PANEL
ALT: 8 IU/L (ref 0–44)
AST: 15 IU/L (ref 0–40)
Albumin/Globulin Ratio: 2.1 (ref 1.2–2.2)
Albumin: 4 g/dL (ref 3.6–4.6)
Alkaline Phosphatase: 103 IU/L (ref 44–121)
BUN/Creatinine Ratio: 10 (ref 10–24)
BUN: 10 mg/dL (ref 8–27)
Bilirubin Total: 0.3 mg/dL (ref 0.0–1.2)
CO2: 24 mmol/L (ref 20–29)
Calcium: 8.9 mg/dL (ref 8.6–10.2)
Chloride: 103 mmol/L (ref 96–106)
Creatinine, Ser: 0.96 mg/dL (ref 0.76–1.27)
Globulin, Total: 1.9 g/dL (ref 1.5–4.5)
Glucose: 137 mg/dL — ABNORMAL HIGH (ref 70–99)
Potassium: 3.7 mmol/L (ref 3.5–5.2)
Sodium: 141 mmol/L (ref 134–144)
Total Protein: 5.9 g/dL — ABNORMAL LOW (ref 6.0–8.5)
eGFR: 79 mL/min/{1.73_m2} (ref >59)

## 2021-06-16 LAB — LIPID PANEL
Chol/HDL Ratio: 4 ratio (ref 0.0–5.0)
Cholesterol, Total: 147 mg/dL (ref 100–199)
HDL: 37 mg/dL — ABNORMAL LOW (ref >39)
LDL Chol Calc (NIH): 67 mg/dL (ref 0–99)
Triglycerides: 268 mg/dL — ABNORMAL HIGH (ref 0–149)
VLDL Cholesterol Cal: 43 mg/dL — ABNORMAL HIGH (ref 5–40)

## 2021-06-16 LAB — HEMOGLOBIN A1C
Est. average glucose Bld gHb Est-mCnc: 143 mg/dL
Hgb A1c MFr Bld: 6.6 % — ABNORMAL HIGH (ref 4.8–5.6)

## 2021-06-18 NOTE — Progress Notes (Signed)
Please call patient to tell him:  Regarding recent labs, your average blood sugar levels are okay; continue to follow a diabetic diet.  Cholesterol levels are a little high. Continue to take cholesterol medicine (pravastatin).  Kidneys and liver are fine.

## 2021-06-26 ENCOUNTER — Observation Stay (HOSPITAL_COMMUNITY)
Admission: EM | Admit: 2021-06-26 | Discharge: 2021-06-28 | Disposition: A | Discharge status: Home / Self Care | Payer: Medicare HMO | Attending: Family Medicine | Admitting: Family Medicine

## 2021-06-26 ENCOUNTER — Emergency Department (HOSPITAL_COMMUNITY): Payer: Medicare HMO

## 2021-06-26 ENCOUNTER — Encounter (HOSPITAL_COMMUNITY): Payer: Self-pay

## 2021-06-26 ENCOUNTER — Other Ambulatory Visit: Payer: Self-pay

## 2021-06-26 ENCOUNTER — Ambulatory Visit (HOSPITAL_COMMUNITY)
Admission: EM | Admit: 2021-06-26 | Discharge: 2021-06-26 | Disposition: A | Discharge status: Home / Self Care | Payer: Medicare HMO | Attending: Family Medicine | Admitting: Family Medicine

## 2021-06-26 DIAGNOSIS — R0789 Other chest pain: Secondary | ICD-10-CM | POA: Insufficient documentation

## 2021-06-26 DIAGNOSIS — Z7952 Long term (current) use of systemic steroids: Secondary | ICD-10-CM | POA: Diagnosis not present

## 2021-06-26 DIAGNOSIS — R001 Bradycardia, unspecified: Secondary | ICD-10-CM | POA: Diagnosis not present

## 2021-06-26 DIAGNOSIS — C884 Extranodal marginal zone b-cell lymphoma of mucosa-associated lymphoid tissue (malt-lymphoma) not having achieved remission: Secondary | ICD-10-CM | POA: Diagnosis present

## 2021-06-26 DIAGNOSIS — Z79899 Other long term (current) drug therapy: Secondary | ICD-10-CM | POA: Insufficient documentation

## 2021-06-26 DIAGNOSIS — Z8546 Personal history of malignant neoplasm of prostate: Secondary | ICD-10-CM | POA: Diagnosis not present

## 2021-06-26 DIAGNOSIS — Z7982 Long term (current) use of aspirin: Secondary | ICD-10-CM | POA: Diagnosis not present

## 2021-06-26 DIAGNOSIS — Z86718 Personal history of other venous thrombosis and embolism: Secondary | ICD-10-CM | POA: Diagnosis not present

## 2021-06-26 DIAGNOSIS — Z87891 Personal history of nicotine dependence: Secondary | ICD-10-CM | POA: Diagnosis not present

## 2021-06-26 DIAGNOSIS — Z955 Presence of coronary angioplasty implant and graft: Secondary | ICD-10-CM | POA: Diagnosis not present

## 2021-06-26 DIAGNOSIS — E1159 Type 2 diabetes mellitus with other circulatory complications: Secondary | ICD-10-CM

## 2021-06-26 DIAGNOSIS — J449 Chronic obstructive pulmonary disease, unspecified: Secondary | ICD-10-CM | POA: Diagnosis not present

## 2021-06-26 DIAGNOSIS — E1122 Type 2 diabetes mellitus with diabetic chronic kidney disease: Secondary | ICD-10-CM | POA: Diagnosis not present

## 2021-06-26 DIAGNOSIS — I7143 Infrarenal abdominal aortic aneurysm, without rupture: Secondary | ICD-10-CM | POA: Insufficient documentation

## 2021-06-26 DIAGNOSIS — I25119 Atherosclerotic heart disease of native coronary artery with unspecified angina pectoris: Secondary | ICD-10-CM | POA: Insufficient documentation

## 2021-06-26 DIAGNOSIS — G4734 Idiopathic sleep related nonobstructive alveolar hypoventilation: Secondary | ICD-10-CM | POA: Diagnosis present

## 2021-06-26 DIAGNOSIS — C61 Malignant neoplasm of prostate: Secondary | ICD-10-CM | POA: Diagnosis not present

## 2021-06-26 DIAGNOSIS — Z8249 Family history of ischemic heart disease and other diseases of the circulatory system: Secondary | ICD-10-CM | POA: Diagnosis not present

## 2021-06-26 DIAGNOSIS — Z8349 Family history of other endocrine, nutritional and metabolic diseases: Secondary | ICD-10-CM | POA: Insufficient documentation

## 2021-06-26 DIAGNOSIS — E1151 Type 2 diabetes mellitus with diabetic peripheral angiopathy without gangrene: Secondary | ICD-10-CM | POA: Insufficient documentation

## 2021-06-26 DIAGNOSIS — E119 Type 2 diabetes mellitus without complications: Secondary | ICD-10-CM | POA: Insufficient documentation

## 2021-06-26 DIAGNOSIS — Z6835 Body mass index (BMI) 35.0-35.9, adult: Secondary | ICD-10-CM | POA: Insufficient documentation

## 2021-06-26 DIAGNOSIS — Z8679 Personal history of other diseases of the circulatory system: Secondary | ICD-10-CM | POA: Diagnosis not present

## 2021-06-26 DIAGNOSIS — I2510 Atherosclerotic heart disease of native coronary artery without angina pectoris: Secondary | ICD-10-CM | POA: Diagnosis present

## 2021-06-26 DIAGNOSIS — I25110 Atherosclerotic heart disease of native coronary artery with unstable angina pectoris: Secondary | ICD-10-CM | POA: Diagnosis not present

## 2021-06-26 DIAGNOSIS — R9431 Abnormal electrocardiogram [ECG] [EKG]: Secondary | ICD-10-CM

## 2021-06-26 DIAGNOSIS — R079 Chest pain, unspecified: Secondary | ICD-10-CM | POA: Diagnosis not present

## 2021-06-26 DIAGNOSIS — Z7984 Long term (current) use of oral hypoglycemic drugs: Secondary | ICD-10-CM | POA: Insufficient documentation

## 2021-06-26 DIAGNOSIS — I441 Atrioventricular block, second degree: Secondary | ICD-10-CM | POA: Diagnosis not present

## 2021-06-26 DIAGNOSIS — N182 Chronic kidney disease, stage 2 (mild): Secondary | ICD-10-CM | POA: Insufficient documentation

## 2021-06-26 DIAGNOSIS — I129 Hypertensive chronic kidney disease with stage 1 through stage 4 chronic kidney disease, or unspecified chronic kidney disease: Secondary | ICD-10-CM | POA: Diagnosis not present

## 2021-06-26 DIAGNOSIS — E669 Obesity, unspecified: Secondary | ICD-10-CM | POA: Diagnosis not present

## 2021-06-26 DIAGNOSIS — E1136 Type 2 diabetes mellitus with diabetic cataract: Secondary | ICD-10-CM | POA: Insufficient documentation

## 2021-06-26 DIAGNOSIS — Z8572 Personal history of non-Hodgkin lymphomas: Secondary | ICD-10-CM | POA: Diagnosis not present

## 2021-06-26 DIAGNOSIS — D509 Iron deficiency anemia, unspecified: Secondary | ICD-10-CM | POA: Insufficient documentation

## 2021-06-26 LAB — CBC
HCT: 33.3 % — ABNORMAL LOW (ref 39.0–52.0)
Hemoglobin: 10.3 g/dL — ABNORMAL LOW (ref 13.0–17.0)
MCH: 22.4 pg — ABNORMAL LOW (ref 26.0–34.0)
MCHC: 30.9 g/dL (ref 30.0–36.0)
MCV: 72.4 fL — ABNORMAL LOW (ref 80.0–100.0)
Platelets: 183 10*3/uL (ref 150–400)
RBC: 4.6 MIL/uL (ref 4.22–5.81)
RDW: 16.4 % — ABNORMAL HIGH (ref 11.5–15.5)
WBC: 5.4 10*3/uL (ref 4.0–10.5)
nRBC: 0 % (ref 0.0–0.2)

## 2021-06-26 LAB — COMPREHENSIVE METABOLIC PANEL
ALT: 11 U/L (ref 0–44)
AST: 15 U/L (ref 15–41)
Albumin: 3.5 g/dL (ref 3.5–5.0)
Alkaline Phosphatase: 82 U/L (ref 38–126)
Anion gap: 9 (ref 5–15)
BUN: 8 mg/dL (ref 8–23)
CO2: 27 mmol/L (ref 22–32)
Calcium: 8.9 mg/dL (ref 8.9–10.3)
Chloride: 104 mmol/L (ref 98–111)
Creatinine, Ser: 1.02 mg/dL (ref 0.61–1.24)
GFR, Estimated: >60 mL/min (ref >60)
Glucose, Bld: 84 mg/dL (ref 70–99)
Potassium: 3.4 mmol/L — ABNORMAL LOW (ref 3.5–5.1)
Sodium: 140 mmol/L (ref 135–145)
Total Bilirubin: 0.2 mg/dL — ABNORMAL LOW (ref 0.3–1.2)
Total Protein: 6.5 g/dL (ref 6.5–8.1)

## 2021-06-26 LAB — CBG MONITORING, ED: Glucose-Capillary: 99 mg/dL (ref 70–99)

## 2021-06-26 LAB — TROPONIN I (HIGH SENSITIVITY)
Troponin I (High Sensitivity): 11 ng/L (ref <18)
Troponin I (High Sensitivity): 13 ng/L (ref <18)

## 2021-06-26 LAB — PHOSPHORUS: Phosphorus: 3 mg/dL (ref 2.5–4.6)

## 2021-06-26 LAB — MAGNESIUM: Magnesium: 1.9 mg/dL (ref 1.7–2.4)

## 2021-06-26 LAB — BRAIN NATRIURETIC PEPTIDE: B Natriuretic Peptide: 64.3 pg/mL (ref 0.0–100.0)

## 2021-06-26 MED ORDER — TRAMADOL HCL 50 MG PO TABS: 50.0000 mg | ORAL_TABLET | Freq: Four times a day (QID) | ORAL | Status: DC | PRN

## 2021-06-26 MED ORDER — AMLODIPINE BESYLATE 10 MG PO TABS
10.0000 mg | ORAL_TABLET | Freq: Every day | ORAL | Status: DC
  Administered 2021-06-27 – 2021-06-28 (×2): 10 mg via ORAL
  Filled 2021-06-26 (×2): qty 1

## 2021-06-26 MED ORDER — FERROUS SULFATE 325 (65 FE) MG PO TABS
325.0000 mg | ORAL_TABLET | Freq: Two times a day (BID) | ORAL | Status: DC
  Administered 2021-06-27: 325 mg via ORAL
  Filled 2021-06-26 (×3): qty 1

## 2021-06-26 MED ORDER — TAMSULOSIN HCL 0.4 MG PO CAPS
0.4000 mg | ORAL_CAPSULE | Freq: Every day | ORAL | Status: DC
  Administered 2021-06-27: 0.4 mg via ORAL
  Filled 2021-06-26: qty 1

## 2021-06-26 MED ORDER — ALBUTEROL SULFATE (2.5 MG/3ML) 0.083% IN NEBU: 2.5000 mg | INHALATION_SOLUTION | Freq: Four times a day (QID) | RESPIRATORY_TRACT | Status: DC | PRN

## 2021-06-26 MED ORDER — ASPIRIN EC 81 MG PO TBEC
81.0000 mg | DELAYED_RELEASE_TABLET | Freq: Every day | ORAL | Status: DC
  Administered 2021-06-27 – 2021-06-28 (×2): 81 mg via ORAL
  Filled 2021-06-26 (×2): qty 1

## 2021-06-26 MED ORDER — LATANOPROST 0.005 % OP SOLN
1.0000 [drp] | Freq: Every day | OPHTHALMIC | Status: DC
  Administered 2021-06-27: 1 [drp] via OPHTHALMIC
  Filled 2021-06-26: qty 2.5

## 2021-06-26 MED ORDER — DICLOFENAC SODIUM 1 % EX GEL: 2.0000 g | Freq: Two times a day (BID) | CUTANEOUS | Status: DC | PRN

## 2021-06-26 MED ORDER — ACETAMINOPHEN 325 MG PO TABS
650.0000 mg | ORAL_TABLET | ORAL | Status: DC | PRN
  Administered 2021-06-28: 650 mg via ORAL
  Filled 2021-06-26: qty 2

## 2021-06-26 MED ORDER — ENOXAPARIN SODIUM 40 MG/0.4ML IJ SOSY
40.0000 mg | PREFILLED_SYRINGE | INTRAMUSCULAR | Status: DC
  Administered 2021-06-27 – 2021-06-28 (×2): 40 mg via SUBCUTANEOUS
  Filled 2021-06-26 (×2): qty 0.4

## 2021-06-26 MED ORDER — ISOSORBIDE MONONITRATE ER 60 MG PO TB24
60.0000 mg | ORAL_TABLET | Freq: Every day | ORAL | Status: DC
  Filled 2021-06-26: qty 1

## 2021-06-26 MED ORDER — PREDNISONE 5 MG PO TABS
5.0000 mg | ORAL_TABLET | Freq: Every day | ORAL | Status: DC
  Filled 2021-06-26: qty 1

## 2021-06-26 MED ORDER — ONDANSETRON HCL 4 MG/2ML IJ SOLN: 4.0000 mg | Freq: Four times a day (QID) | INTRAMUSCULAR | Status: DC | PRN

## 2021-06-26 MED ORDER — GABAPENTIN 600 MG PO TABS
600.0000 mg | ORAL_TABLET | Freq: Two times a day (BID) | ORAL | Status: DC
  Administered 2021-06-27 – 2021-06-28 (×3): 600 mg via ORAL
  Filled 2021-06-26 (×3): qty 1

## 2021-06-26 MED ORDER — PRAVASTATIN SODIUM 40 MG PO TABS
40.0000 mg | ORAL_TABLET | Freq: Every evening | ORAL | Status: DC
  Administered 2021-06-27: 40 mg via ORAL
  Filled 2021-06-26: qty 1

## 2021-06-26 MED ORDER — CALCIUM POLYCARBOPHIL 625 MG PO TABS
625.0000 mg | ORAL_TABLET | ORAL | Status: DC | PRN
  Filled 2021-06-26: qty 1

## 2021-06-26 MED ORDER — ABIRATERONE ACETATE 250 MG PO TABS: 1000.0000 mg | ORAL_TABLET | Freq: Every day | ORAL | Status: DC

## 2021-06-26 MED ORDER — PANTOPRAZOLE SODIUM 40 MG PO TBEC
40.0000 mg | DELAYED_RELEASE_TABLET | Freq: Every day | ORAL | Status: DC
  Administered 2021-06-27 – 2021-06-28 (×2): 40 mg via ORAL
  Filled 2021-06-26 (×2): qty 1

## 2021-06-26 MED ORDER — BRIMONIDINE TARTRATE 0.2 % OP SOLN
1.0000 [drp] | Freq: Two times a day (BID) | OPHTHALMIC | Status: DC
  Administered 2021-06-27 – 2021-06-28 (×3): 1 [drp] via OPHTHALMIC
  Filled 2021-06-26: qty 5

## 2021-06-26 NOTE — Assessment & Plan Note (Addendum)
No syncope, no hypotension.  ?- EP consulted, will arrange outpatient zio patch. No plans for pacemaker at this time. Ok to discharge today. ?- Not on provocative medications and will continue to avoid AV nodal blocking agents. ?

## 2021-06-26 NOTE — ED Notes (Signed)
While patient hooked to EKG machine, HR ranging from 43-605. Maudie Mercury, NP at bedside.  ?

## 2021-06-26 NOTE — ED Notes (Signed)
[facility] here to transport patient to ED.  ?

## 2021-06-26 NOTE — Assessment & Plan Note (Addendum)
-   Continue prednisone, zytiga (patient supplied) ?

## 2021-06-26 NOTE — ED Triage Notes (Signed)
Pt transferred from [facility] urgent care. Pt went to urgent care after experiencing intermittent CP since last night. Hx cardiac stent. Pt denies CP on arrival to ED, in no obvious distress, and caox4. ?

## 2021-06-26 NOTE — ED Notes (Signed)
[facility] called and en route. ?

## 2021-06-26 NOTE — ED Notes (Signed)
Called Charge and made aware patient aware pt coming via [facility].  ?

## 2021-06-26 NOTE — Assessment & Plan Note (Addendum)
No wheezing.  ?- Cont home nebs ?

## 2021-06-26 NOTE — ED Provider Notes (Signed)
?[facility] ? ? ? ?CSN: [id number] ?Arrival date & time: 06/26/21  1757 ? ? ?  ? ?History   ?Chief Complaint ?Chief Complaint  ?Patient presents with  ? Chest Pain  ? ? ?HPI ?Joel [name] is a 81 y.o. male.  ? ?HPI ?Patient with a medical history significant for CAD with stent placement, AAA,  atypical chest, type 2 diabetes, COPD, chronic kidney disease, history of DVT, iron deficiency, presents today with a 2-day history of mid sternal chest pain.  He endorses some mild shortness of breath.  He reports that the pain has been pretty consistent.  He has a history of atypical chest pain and has recently seen his cardiologist.  He reports he took ASA 81 earlier today.  He reports compliance with all of his medications, although reports he is inconsistent with taking his iron supplements and he does not use his nitroglycerin with chest pain as he reports it does not work.  He denies any syncopal episodes.  He is fully alert.  Vital signs are stable with exception of heart rate fluctuating between the low 40s and mid 50s.  [facility] dispatched as patient requires a higher level of care to evaluate etiology of symptoms. ?Past Medical History:  ?Diagnosis Date  ? Atypical chest pain   ? CAD (coronary artery disease)   ? a. Multiple prior evaluations then 05/2012 s/p BMS to OM2. b. Nuc 11/2012 wnl. c. Cath 2015: patent stent, moderate LAD/RCA dz, treated medically.  ? Cataract   ? CKD (chronic kidney disease), stage II   ? COPD (chronic obstructive pulmonary disease) ([place])   ? Cough secondary to angiotensin converting enzyme inhibitor (ACE-I)   ? Diabetes mellitus ([place]) 09/07/2019  ? Diabetes mellitus without complication ([place])   ? Diverticulosis   ? DVT (deep venous thrombosis) ([place])   ? Family history of breast cancer in first degree relative   ? GERD (gastroesophageal reflux disease)   ? Headache(784.0)   ? Hx: of  ? History of blood transfusion   ? "I've had 2; don't know what it was related to" (11/09/2012)   ? HLD (hyperlipidemia)   ? HTN (hypertension)   ? Iron deficiency anemia   ? Lymphoproliferative disorder, low grade B cell ([place]) 05/21/2020  ? Microcytic anemia   ? Peripheral vascular disease ([place])   ? a. L pop-tibial bypass 2011, followed by [facility].  ? Personal history of prostate cancer   ? Rotator cuff injury   ? "left arm; never repaired" (11/09/2012)  ? ? ?Patient Active Problem List  ? Diagnosis Date Noted  ? Constipation, unspecified 05/11/2021  ? Non-Hodgkin lymphoma, unspecified, unspecified site ([place]) 05/11/2021  ? Encounter for therapeutic drug level monitoring 05/11/2021  ? Alpha thalassaemia minor 12/22/2020  ? Benign hypertensive heart disease 12/22/2020  ? Contact with and (suspected) exposure to other environmental pollution 12/22/2020  ? Aortic atherosclerosis ([place]) 07/17/2020  ? 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 Lymphoproliferative disorder, low grade B cell (HCC)-Stage 4 05/21/2020  ? Chest pain 02/10/2020  ? 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 Carcinoma of prostate ([place]) 09/06/2019  ? Chest pain of uncertain etiology   ? Sleep related hypoxia 04/03/2019  ? Obesity (BMI 30-39.9) 04/03/2019  ? Snoring 04/03/2019  ? At risk for sleep apnea 01/10/2019  ? Excessive daytime sleepiness 01/10/2019  ? Controlled type 2 diabetes mellitus with hyperglycemia, without long-term current use of insulin ([place]) 12/11/2018  ? Hypoxic encephalopathy ([place])  12/11/2018  ? Obesity hypoventilation syndrome ([place]) 12/11/2018  ? Chronic low back pain 09/11/2018  ? COPD (chronic obstructive pulmonary disease) ([place])   ? Seasonal allergies 07/04/2018  ? Chronic obstructive pulmonary disease ([place]) 07/04/2018  ? Pulsatile tinnitus 02/17/2018  ? Dizziness 01/20/2018  ? Malaise 01/20/2018  ? Decreased breath sounds 01/20/2018  ? Needs flu shot 01/20/2018  ? Chronic nonintractable headache 12/07/2017  ? Back pain 11/01/2017  ? Radicular pain of lower extremity 11/01/2017  ?  Prostate cancer ([place]) 11/01/2017  ? Family history of breast cancer in first degree relative   ? Personal history of prostate cancer   ? Adenomatous polyp of colon 04/12/2017  ? Elliptocytosis on peripheral blood smear ([place]) 09/13/2016  ? Prediabetes 03/22/2016  ? Anemia due to chronic blood loss 04/12/2014  ? PVD (peripheral vascular disease) with claudication ([place]) 11/15/2012  ? Chest pain, localized 11/10/2012  ? Hypokalemia 11/10/2012  ? Bradycardia 11/09/2012  ? Thrombocytopenia ([place]) 06/01/2012  ? Microcytic anemia   ? Atherosclerosis of native artery of extremity with intermittent claudication ([place]) 06/16/2011  ? Stable angina ([place]) 06/11/2011  ? CAD (coronary artery disease)   ? Chest pain, atypical 05/30/2011  ? Iron deficiency anemia 10/03/2009  ? DIVERTICULOSIS OF COLON 10/03/2009  ? WEIGHT LOSS, ABNORMAL 10/03/2009  ? CHEST PAIN UNSPECIFIED 09/13/2008  ? Headache(784.0) 08/19/2008  ? TIA 06/05/2008  ? SHOULDER PAIN, LEFT 10/11/2007  ? COUGH DUE TO ACE INHIBITORS 05/07/2007  ? History of tobacco use disorder 04/25/2007  ? Hyperlipidemia 04/21/2007  ? Essential hypertension 04/21/2007  ? GERD 04/21/2007  ? ? ?Past Surgical History:  ?Procedure Laterality Date  ? CARDIOVASCULAR STRESS TEST  Aug. 2014  ? CATARACT EXTRACTION W/ INTRAOCULAR LENS IMPLANT Right ~ 2008  ? CIRCUMCISION    ? COLONOSCOPY    ? CORONARY ANGIOPLASTY WITH STENT PLACEMENT  2014  ? "1" (11/09/2012)  ? ILIAC ARTERY ANEURYSM REPAIR    ? LEFT HEART CATH  05-31-12  ? LEFT HEART CATH AND CORONARY ANGIOGRAPHY N/A 08/01/2019  ? Procedure: LEFT HEART CATH AND CORONARY ANGIOGRAPHY;  Surgeon: Burnell Blanks, MD;  Location: [facility];  Service: Cardiovascular;  Laterality: N/A;  ? LEFT HEART CATHETERIZATION WITH CORONARY ANGIOGRAM N/A 06/01/2011  ? Procedure: LEFT HEART CATHETERIZATION WITH CORONARY ANGIOGRAM;  Surgeon: Hillary Bow, MD;  Location: [facility] CATH LAB;  Service: Cardiovascular;  Laterality: N/A;  ? LEFT HEART  CATHETERIZATION WITH CORONARY ANGIOGRAM N/A 05/31/2012  ? Procedure: LEFT HEART CATHETERIZATION WITH CORONARY ANGIOGRAM;  Surgeon: Peter M Martinique, MD;  Location: [facility] CATH LAB;  Service: Cardiovascular;  Laterality: N/A;  ? LEFT HEART CATHETERIZATION WITH CORONARY ANGIOGRAM N/A 08/01/2013  ? Procedure: LEFT HEART CATHETERIZATION WITH CORONARY ANGIOGRAM;  Surgeon: Burnell Blanks, MD;  Location: [facility] CATH LAB;  Service: Cardiovascular;  Laterality: N/A;  ? PERCUTANEOUS CORONARY STENT INTERVENTION (PCI-S)  05/31/2012  ? Procedure: PERCUTANEOUS CORONARY STENT INTERVENTION (PCI-S);  Surgeon: Peter M Martinique, MD;  Location: [facility] CATH LAB;  Service: Cardiovascular;;  ? PR VEIN BYPASS GRAFT,AORTO-FEM-POP Left 10/01/2009  ? left below knee popliteal artery to posterior tibial artery  ? SHOULDER ARTHROSCOPY WITH OPEN ROTATOR CUFF REPAIR AND DISTAL CLAVICLE ACROMINECTOMY Left 01/12/2013  ? Procedure: LEFT SHOULDER ARTHROSCOPY WITH DEBRIDEMENT OPEN DISTAL CLAVICLE RESECTION ,acromioplastyAND ROTATOR CUFF REPAIR;  Surgeon: Yvette Rack., MD;  Location: [facility];  Service: Orthopedics;  Laterality: Left;  ? ? ? ? ? ?Home Medications   ? ?Prior to Admission medications   ?Medication Sig  Start Date End Date Taking? Authorizing Provider  ?abiraterone acetate (ZYTIGA) 250 MG tablet Take 250 mg by mouth daily.    [provider]  ?Accu-Chek FastClix Lancets MISC Test once daily 11/23/18   Henson, Vickie L, NP-C  ?albuterol (VENTOLIN HFA) 108 (90 Base) MCG/ACT inhaler INHALE 2 PUFFS INTO THE LUNGS EVERY 6 HOURS AS NEEDED FOR WHEEZING OR SHORTNESS OF BREATH ?Patient taking differently: 2 puffs every 6 (six) hours as needed for shortness of breath. 03/15/19   Henson, Vickie L, NP-C  ?amLODipine (NORVASC) 10 MG tablet Take 1 tablet (10 mg total) by mouth daily. 12/16/20   Burnell Blanks, MD  ?aspirin EC 81 MG tablet Take 81 mg by mouth daily.     [provider]  ?brimonidine (ALPHAGAN) 0.2 % ophthalmic solution Place 1  drop into both eyes 2 (two) times daily. 05/09/20   [provider]  ?diclofenac Sodium (VOLTAREN) 1 % GEL Apply 2 g topically 2 (two) times daily as needed (back pain). ?Patient not taking: Reported on 06/15/2021

## 2021-06-26 NOTE — Assessment & Plan Note (Addendum)
Last LHC (2021) = mod CAD, except severe in PDA that was too small for them to intervene on with stent. Had preoperative stress testing Jan 2023 which was low risk.  ?- Follow up with cardiology, we've increased imdur added ranexa per their recommendations.  ?- Continue ASA, statin ?

## 2021-06-26 NOTE — H&P (Signed)
?History and Physical  ? ? ?Patient: Joel [name]:[id number] DOB: [birth year] ?DOA: 06/26/2021 ?DOS: the patient was seen and examined on 06/26/2021 ?PCP: Irene Pap, PA-C  ?Patient coming from: Home ? ?Chief Complaint:  ?Chief Complaint  ?Patient presents with  ? Chest Pain  ? ?HPI: Joel [name] is a 81 y.o. male with medical history significant of CAD s/p stent, DM2, prostate CA, MALT lymphoma of orbit, COPD, HTN. ? ?Pt presents to ED with c/o intermittent CP since yesterday.  Tells me no association with eating.  No active CP at this time. ? ?Denies lightheadedness or dizziness. ? ?EDP concerned because he is having [place] 1 block, not previously documented.  Intermittent episodes of bradycardia to the 40s in ED. ? ? ?Review of Systems: As mentioned in the history of present illness. All other systems reviewed and are negative. ?Past Medical History:  ?Diagnosis Date  ? Atypical chest pain   ? CAD (coronary artery disease)   ? a. Multiple prior evaluations then 05/2012 s/p BMS to OM2. b. Nuc 11/2012 wnl. c. Cath 2015: patent stent, moderate LAD/RCA dz, treated medically.  ? Cataract   ? CKD (chronic kidney disease), stage II   ? COPD (chronic obstructive pulmonary disease) ([place])   ? Cough secondary to angiotensin converting enzyme inhibitor (ACE-I)   ? Diabetes mellitus ([place]) 09/07/2019  ? Diabetes mellitus without complication ([place])   ? Diverticulosis   ? DVT (deep venous thrombosis) ([place])   ? Family history of breast cancer in first degree relative   ? GERD (gastroesophageal reflux disease)   ? Headache(784.0)   ? Hx: of  ? History of blood transfusion   ? "I've had 2; don't know what it was related to" (11/09/2012)  ? HLD (hyperlipidemia)   ? HTN (hypertension)   ? Iron deficiency anemia   ? Lymphoproliferative disorder, low grade B cell ([place]) 05/21/2020  ? Microcytic anemia   ? Peripheral vascular disease ([place])   ? a. L pop-tibial bypass 2011, followed by [facility].  ? Personal history of prostate cancer    ? Rotator cuff injury   ? "left arm; never repaired" (11/09/2012)  ? ?Past Surgical History:  ?Procedure Laterality Date  ? CARDIOVASCULAR STRESS TEST  Aug. 2014  ? CATARACT EXTRACTION W/ INTRAOCULAR LENS IMPLANT Right ~ 2008  ? CIRCUMCISION    ? COLONOSCOPY    ? CORONARY ANGIOPLASTY WITH STENT PLACEMENT  2014  ? "1" (11/09/2012)  ? ILIAC ARTERY ANEURYSM REPAIR    ? LEFT HEART CATH  05-31-12  ? LEFT HEART CATH AND CORONARY ANGIOGRAPHY N/A 08/01/2019  ? Procedure: LEFT HEART CATH AND CORONARY ANGIOGRAPHY;  Surgeon: Burnell Blanks, MD;  Location: [facility];  Service: Cardiovascular;  Laterality: N/A;  ? LEFT HEART CATHETERIZATION WITH CORONARY ANGIOGRAM N/A 06/01/2011  ? Procedure: LEFT HEART CATHETERIZATION WITH CORONARY ANGIOGRAM;  Surgeon: Hillary Bow, MD;  Location: [facility] CATH LAB;  Service: Cardiovascular;  Laterality: N/A;  ? LEFT HEART CATHETERIZATION WITH CORONARY ANGIOGRAM N/A 05/31/2012  ? Procedure: LEFT HEART CATHETERIZATION WITH CORONARY ANGIOGRAM;  Surgeon: Peter M Martinique, MD;  Location: [facility] CATH LAB;  Service: Cardiovascular;  Laterality: N/A;  ? LEFT HEART CATHETERIZATION WITH CORONARY ANGIOGRAM N/A 08/01/2013  ? Procedure: LEFT HEART CATHETERIZATION WITH CORONARY ANGIOGRAM;  Surgeon: Burnell Blanks, MD;  Location: [facility] CATH LAB;  Service: Cardiovascular;  Laterality: N/A;  ? PERCUTANEOUS CORONARY STENT INTERVENTION (PCI-S)  05/31/2012  ? Procedure: PERCUTANEOUS CORONARY STENT INTERVENTION (PCI-S);  Surgeon:  Peter M Martinique, MD;  Location: [facility] CATH LAB;  Service: Cardiovascular;;  ? PR VEIN BYPASS GRAFT,AORTO-FEM-POP Left 10/01/2009  ? left below knee popliteal artery to posterior tibial artery  ? SHOULDER ARTHROSCOPY WITH OPEN ROTATOR CUFF REPAIR AND DISTAL CLAVICLE ACROMINECTOMY Left 01/12/2013  ? Procedure: LEFT SHOULDER ARTHROSCOPY WITH DEBRIDEMENT OPEN DISTAL CLAVICLE RESECTION ,acromioplastyAND ROTATOR CUFF REPAIR;  Surgeon: Yvette Rack., MD;  Location: [facility];  Service:  Orthopedics;  Laterality: Left;  ? ?Social History:  reports that he quit smoking about 10 years ago. His smoking use included cigarettes. He has a 26.00 pack-year smoking history. He has been exposed to tobacco smoke. He has never used smokeless tobacco. He reports that he does not drink alcohol and does not use drugs. ? ?Allergies  ?Allergen Reactions  ? Topiramate Shortness Of Breath and Other (See Comments)  ?   leg cramps  ? Atenolol Other (See Comments)  ?  bradycardia  ? Lisinopril Cough  ? Tramadol-Acetaminophen Cough  ?  Tolerates plain Tramadol  ? ? ?Family History  ?Problem Relation Age of Onset  ? Breast cancer Mother   ?     dx 8s  ? Hyperlipidemia Mother   ? Hypertension Mother   ? COPD Father   ?     deceased  ? Hyperlipidemia Father   ? Hypertension Father   ? Hypertension Sister   ? Cancer Brother 61  ?     Unknown cancer, possibly stomach  ? Hypertension Brother   ? Cancer Brother 41  ?     Unknown, possibly lung  ? Hypertension Daughter   ? Hypertension Son   ? Colon cancer Neg Hx   ?     pt is unclear about family hx  ? ? ?Prior to Admission medications   ?Medication Sig Start Date End Date Taking? Authorizing Provider  ?abiraterone acetate (ZYTIGA) 250 MG tablet Take 1,000 mg by mouth daily.   Yes [provider]  ?albuterol (VENTOLIN HFA) 108 (90 Base) MCG/ACT inhaler INHALE 2 PUFFS INTO THE LUNGS EVERY 6 HOURS AS NEEDED FOR WHEEZING OR SHORTNESS OF BREATH ?Patient taking differently: 2 puffs every 6 (six) hours as needed for shortness of breath. 03/15/19  Yes Henson, Vickie L, NP-C  ?amLODipine (NORVASC) 10 MG tablet Take 1 tablet (10 mg total) by mouth daily. 12/16/20  Yes Burnell Blanks, MD  ?aspirin EC 81 MG tablet Take 81 mg by mouth daily.    Yes [provider]  ?brimonidine (ALPHAGAN) 0.2 % ophthalmic solution Place 1 drop into both eyes 2 (two) times daily. 05/09/20  Yes [provider]  ?diclofenac Sodium (VOLTAREN) 1 % GEL Apply 2 g topically 2 (two)  times daily as needed (back pain). 06/16/20  Yes [provider]  ?ferrous sulfate 325 (65 FE) MG tablet Take 325 mg by mouth 2 (two) times daily with a meal.   Yes [provider]  ?gabapentin (NEURONTIN) 600 MG tablet Take 600 mg by mouth 2 (two) times daily.   Yes [provider]  ?isosorbide mononitrate (IMDUR) 60 MG 24 hr tablet TAKE 1 TABLET(60 MG) BY MOUTH DAILY ?Patient taking differently: 60 mg daily. 03/23/21  Yes Burnell Blanks, MD  ?latanoprost (XALATAN) 0.005 % ophthalmic solution Place 1 drop into both eyes at bedtime. 02/28/20  Yes [provider]  ?LORATADINE PO Take 10 mg by mouth at bedtime.   Yes [provider]  ?metFORMIN (GLUCOPHAGE) 500 MG tablet Take 500 mg by  mouth 2 (two) times daily. 01/26/21  Yes [provider]  ?nitroGLYCERIN (NITROSTAT) 0.4 MG SL tablet Place 1 tablet (0.4 mg total) under the tongue every 5 (five) minutes as needed for chest pain (max 3 doses). ?Patient taking differently: Place 0.4 mg under the tongue every 5 (five) minutes as needed for chest pain. 10/20/20  Yes Burnell Blanks, MD  ?omeprazole (PRILOSEC) 20 MG capsule Take 20 mg by mouth in the morning and at bedtime.   Yes [provider]  ?polycarbophil (FIBERCON) 625 MG tablet Take 625 mg by mouth as needed for mild constipation.   Yes [provider]  ?pravastatin (PRAVACHOL) 40 MG tablet Take 1 tablet (40 mg total) by mouth every evening. 06/06/20  Yes Burnell Blanks, MD  ?predniSONE (DELTASONE) 5 MG tablet Take 5 mg by mouth daily. 07/17/20  Yes [provider]  ?tamsulosin (FLOMAX) 0.4 MG CAPS capsule Take 0.4 mg by mouth at bedtime.   Yes [provider]  ?traMADol (ULTRAM) 50 MG tablet TAKE 1 TABLET(50 MG) BY MOUTH EVERY 8 HOURS AS NEEDED FOR SEVERE PAIN ?Patient taking differently: Take 50 mg by mouth every 6 (six) hours as needed for moderate pain. 02/04/21  Yes Denita Lung, MD  ?Accu-Chek  FastClix Lancets MISC Test once daily 11/23/18   Girtha Rm, NP-C  ? ? ?Physical Exam: ?Vitals:  ? 06/26/21 2223 06/26/21 2230 06/26/21 2245 06/26/21 2300  ?BP:  (!) 148/65 (!) 143/63 (!) 156/72  ?Pulse:

## 2021-06-26 NOTE — Assessment & Plan Note (Addendum)
-   Follows with heme/onc at [facility], plan to continue follow up as scheduled 3/31.  ?

## 2021-06-26 NOTE — ED Triage Notes (Signed)
Pt presents with central chest pain that radiates through to back, pt states he has intermittent issues with burping but when he is finally able to burp it causes him to feel short of breath. ? ?Pt states he has more intermittent pain after eating. ?

## 2021-06-26 NOTE — ED Provider Notes (Signed)
?[facility] ?Provider Note ? ? ?CSN: [id number] ?Arrival date & time: 06/26/21  1837 ? ?  ? ?History ?Chief Complaint  ?Patient presents with  ? Chest Pain  ? ? ?Joel [name] is a 81 y.o. male w/ PMHx (per chart review) CAD with stent placement, AAA,  atypical chest, type 2 diabetes, COPD, chronic kidney disease, history of DVT, iron deficiency,  metastatic prostate cancer, non-Hodgkin's lymphoma presenting for chest pain that occurs after eating.  His last symptoms happened was 5 PM tonight.  Patient has no active chest pain at this time. ? ?HPI ? ?  ? ?Home Medications ?Prior to Admission medications   ?Medication Sig Start Date End Date Taking? Authorizing Provider  ?abiraterone acetate (ZYTIGA) 250 MG tablet Take 1,000 mg by mouth daily.   Yes [provider]  ?albuterol (VENTOLIN HFA) 108 (90 Base) MCG/ACT inhaler INHALE 2 PUFFS INTO THE LUNGS EVERY 6 HOURS AS NEEDED FOR WHEEZING OR SHORTNESS OF BREATH ?Patient taking differently: 2 puffs every 6 (six) hours as needed for shortness of breath. 03/15/19  Yes Henson, Vickie L, NP-C  ?amLODipine (NORVASC) 10 MG tablet Take 1 tablet (10 mg total) by mouth daily. 12/16/20  Yes Burnell Blanks, MD  ?aspirin EC 81 MG tablet Take 81 mg by mouth daily.    Yes [provider]  ?brimonidine (ALPHAGAN) 0.2 % ophthalmic solution Place 1 drop into both eyes 2 (two) times daily. 05/09/20  Yes [provider]  ?diclofenac Sodium (VOLTAREN) 1 % GEL Apply 2 g topically 2 (two) times daily as needed (back pain). 06/16/20  Yes [provider]  ?ferrous sulfate 325 (65 FE) MG tablet Take 325 mg by mouth 2 (two) times daily with a meal.   Yes [provider]  ?gabapentin (NEURONTIN) 600 MG tablet Take 600 mg by mouth 2 (two) times daily.   Yes [provider]  ?isosorbide mononitrate (IMDUR) 60 MG 24 hr tablet TAKE 1 TABLET(60 MG) BY MOUTH DAILY ?Patient taking differently: 60 mg daily.  03/23/21  Yes Burnell Blanks, MD  ?latanoprost (XALATAN) 0.005 % ophthalmic solution Place 1 drop into both eyes at bedtime. 02/28/20  Yes [provider]  ?LORATADINE PO Take 10 mg by mouth at bedtime.   Yes [provider]  ?metFORMIN (GLUCOPHAGE) 500 MG tablet Take 500 mg by mouth 2 (two) times daily. 01/26/21  Yes [provider]  ?nitroGLYCERIN (NITROSTAT) 0.4 MG SL tablet Place 1 tablet (0.4 mg total) under the tongue every 5 (five) minutes as needed for chest pain (max 3 doses). ?Patient taking differently: Place 0.4 mg under the tongue every 5 (five) minutes as needed for chest pain. 10/20/20  Yes Burnell Blanks, MD  ?omeprazole (PRILOSEC) 20 MG capsule Take 20 mg by mouth in the morning and at bedtime.   Yes [provider]  ?polycarbophil (FIBERCON) 625 MG tablet Take 625 mg by mouth as needed for mild constipation.   Yes [provider]  ?pravastatin (PRAVACHOL) 40 MG tablet Take 1 tablet (40 mg total) by mouth every evening. 06/06/20  Yes Burnell Blanks, MD  ?predniSONE (DELTASONE) 5 MG tablet Take 5 mg by mouth daily. 07/17/20  Yes [provider]  ?tamsulosin (FLOMAX) 0.4 MG CAPS capsule Take 0.4 mg by mouth at bedtime.   Yes [provider]  ?traMADol (ULTRAM) 50 MG tablet TAKE 1 TABLET(50 MG) BY MOUTH EVERY 8 HOURS AS NEEDED FOR SEVERE PAIN ?Patient taking differently: Take  50 mg by mouth every 6 (six) hours as needed for moderate pain. 02/04/21  Yes Denita Lung, MD  ?Accu-Chek FastClix Lancets MISC Test once daily 11/23/18   Girtha Rm, NP-C  ?   ? ?Allergies    ?Topiramate, Atenolol, Lisinopril, and Tramadol-acetaminophen   ? ?Review of Systems   ?Review of Systems  ?Constitutional:  Negative for chills and fever.  ?Respiratory:  Positive for chest tightness.   ?Cardiovascular:  Positive for chest pain. Negative for palpitations.  ? ?Physical Exam ?Updated Vital Signs ?BP (!) 156/72   Pulse (!) 52    Temp 97.9 ?F (36.6 ?C) (Oral)   Resp 14   Ht '5\' 9"'$  (1.753 m)   Wt 106.1 kg   SpO2 98%   BMI 34.56 kg/m?  ?Physical Exam ? ?ED Results / Procedures / Treatments   ?Labs ?(all labs ordered are listed, but only abnormal results are displayed) ?Labs Reviewed  ?CBC - Abnormal; Notable for the following components:  ?    Result Value  ? Hemoglobin 10.3 (*)   ? HCT 33.3 (*)   ? MCV 72.4 (*)   ? MCH 22.4 (*)   ? RDW 16.4 (*)   ? All other components within normal limits  ?COMPREHENSIVE METABOLIC PANEL - Abnormal; Notable for the following components:  ? Potassium 3.4 (*)   ? Total Bilirubin 0.2 (*)   ? All other components within normal limits  ?MAGNESIUM  ?PHOSPHORUS  ?BRAIN NATRIURETIC PEPTIDE  ?TROPONIN I (HIGH SENSITIVITY)  ?TROPONIN I (HIGH SENSITIVITY)  ? ? ?EKG ?None ? ?Radiology ?DG Chest Port 1 View ? ?Result Date: 06/26/2021 ?CLINICAL DATA:  Central chest pain that radiates to the back. EXAM: PORTABLE CHEST 1 VIEW COMPARISON:  April 14, 2021 FINDINGS: The heart size and mediastinal contours are within normal limits. Both lungs are clear. Degenerative changes seen throughout the thoracic spine. IMPRESSION: No active disease. Electronically Signed   By: Virgina Norfolk M.D.   On: 06/26/2021 19:20   ? ?Procedures ?Procedures  ? ? ?Medications Ordered in ED ?Medications  ?acetaminophen (TYLENOL) tablet 650 mg (has no administration in time range)  ?ondansetron (ZOFRAN) injection 4 mg (has no administration in time range)  ?enoxaparin (LOVENOX) injection 40 mg (has no administration in time range)  ?amLODipine (NORVASC) tablet 10 mg (has no administration in time range)  ?abiraterone acetate (ZYTIGA) tablet 1,000 mg (has no administration in time range)  ?albuterol (VENTOLIN HFA) 108 (90 Base) MCG/ACT inhaler 2 puff (has no administration in time range)  ?aspirin EC tablet 81 mg (has no administration in time range)  ?brimonidine (ALPHAGAN) 0.2 % ophthalmic solution 1 drop (has no administration in time range)   ?diclofenac Sodium (VOLTAREN) 1 % topical gel 2 g (has no administration in time range)  ?gabapentin (NEURONTIN) tablet 600 mg (has no administration in time range)  ?ferrous sulfate tablet 325 mg (has no administration in time range)  ?isosorbide mononitrate (IMDUR) 24 hr tablet 60 mg (has no administration in time range)  ?latanoprost (XALATAN) 0.005 % ophthalmic solution 1 drop (has no administration in time range)  ?predniSONE (DELTASONE) tablet 5 mg (has no administration in time range)  ?tamsulosin (FLOMAX) capsule 0.4 mg (has no administration in time range)  ?traMADol (ULTRAM) tablet 50 mg (has no administration in time range)  ?pravastatin (PRAVACHOL) tablet 40 mg (has no administration in time range)  ?pantoprazole (PROTONIX) EC tablet 40 mg (has no administration in time range)  ?polycarbophil (FIBERCON) tablet 625 mg (has no  administration in time range)  ? ? ?ED Course/ Medical Decision Making/ A&P ?  ?                        ?Medical Decision Making ?Amount and/or Complexity of Data Reviewed ?Labs: ordered. ?Radiology: ordered. ? ?Risk ?Decision regarding hospitalization. ? ? ?81 year old male presenting for intermittent chest pain and was sent from urgent care due to Mobitz type I.  Chart review performed to obtain history.  Report/collateral from EMS. ? ?On exam, hemodynamically stable, no acute distress.  Bradycardic.  Remarkable for hemoglobin of 10.3 with MCV of 72.4.  Initial troponin 11 and repeat 13.  Potassium 3.4. EKGs from urgent care and EMS reviewed and are consistent with second-degree Mobitz type I.  BMP within normal limits.  Chest x-ray on my review shows no focal findings.  Case discussed with cardiology fellow who does not believe emergent consultation is indicated at this time and recommends hospitalist to reach back out if needed.  Case discussed with hospitalist who agrees with admission for further monitoring due to concern for new onset symptomatic arrhythmia.  Patient agrees  with admission. ? ?Patient seen in conjunction with my attending, Dr. Sabra Heck. ? ?Final Clinical Impression(s) / ED Diagnoses ?Final diagnoses:  ?None  ? ? ?Rx / DC Orders ?ED Discharge Orders   ? ? None  ? ?  ?

## 2021-06-27 ENCOUNTER — Encounter (HOSPITAL_COMMUNITY): Payer: Self-pay | Admitting: Internal Medicine

## 2021-06-27 DIAGNOSIS — I2510 Atherosclerotic heart disease of native coronary artery without angina pectoris: Secondary | ICD-10-CM

## 2021-06-27 DIAGNOSIS — C884 Extranodal marginal zone B-cell lymphoma of mucosa-associated lymphoid tissue [MALT-lymphoma]: Secondary | ICD-10-CM | POA: Diagnosis not present

## 2021-06-27 DIAGNOSIS — J449 Chronic obstructive pulmonary disease, unspecified: Secondary | ICD-10-CM | POA: Diagnosis not present

## 2021-06-27 DIAGNOSIS — R079 Chest pain, unspecified: Secondary | ICD-10-CM

## 2021-06-27 DIAGNOSIS — Z955 Presence of coronary angioplasty implant and graft: Secondary | ICD-10-CM | POA: Diagnosis not present

## 2021-06-27 DIAGNOSIS — N182 Chronic kidney disease, stage 2 (mild): Secondary | ICD-10-CM | POA: Diagnosis not present

## 2021-06-27 DIAGNOSIS — C61 Malignant neoplasm of prostate: Secondary | ICD-10-CM | POA: Diagnosis not present

## 2021-06-27 DIAGNOSIS — I441 Atrioventricular block, second degree: Secondary | ICD-10-CM | POA: Diagnosis not present

## 2021-06-27 DIAGNOSIS — E119 Type 2 diabetes mellitus without complications: Secondary | ICD-10-CM | POA: Diagnosis not present

## 2021-06-27 DIAGNOSIS — I129 Hypertensive chronic kidney disease with stage 1 through stage 4 chronic kidney disease, or unspecified chronic kidney disease: Secondary | ICD-10-CM | POA: Diagnosis not present

## 2021-06-27 DIAGNOSIS — I25110 Atherosclerotic heart disease of native coronary artery with unstable angina pectoris: Secondary | ICD-10-CM | POA: Diagnosis not present

## 2021-06-27 MED ORDER — RANOLAZINE ER 500 MG PO TB12
500.0000 mg | ORAL_TABLET | Freq: Two times a day (BID) | ORAL | Status: DC
  Administered 2021-06-27 – 2021-06-28 (×2): 500 mg via ORAL
  Filled 2021-06-27 (×2): qty 1

## 2021-06-27 MED ORDER — ISOSORBIDE MONONITRATE ER 60 MG PO TB24
120.0000 mg | ORAL_TABLET | Freq: Every day | ORAL | Status: DC
Start: 2021-06-27 — End: 2021-06-28
  Administered 2021-06-27: 120 mg via ORAL
  Filled 2021-06-27: qty 2

## 2021-06-27 NOTE — Progress Notes (Signed)
?Progress Note ? ?Patient: Joel [name] IWP:[id number] DOB: 1941-03-27  ?DOA: 06/26/2021  DOS: 06/27/2021  ?  ?Brief hospital course: ?LAVANTE TOSO is an 81 y.o. male with a history of CAD s/p stent w/recent reassuring stress test (for preoperative evaluation), T2DM, prostate CA on active treatment, MALT lymphoma of orbit, COPD, HTN who presented to the ED 3/17 with a day of intermittent chest discomfort. Cardiac enzymes were reassuring, though he was noted to have bradycardia with 2nd degree AVB prompting EDP to contact cardiology who recommended admission. Plan is to observe rhythm avoiding AV nodal blocking agents. ? ?Assessment and Plan: ?* AV block, Mobitz 1 ?With significant bradycardic rates worst overnight. No syncope, no hypotension.  ?- Continue cardiac monitoring. Overnight rate into 20's.  ?- Not on provocative medications and will continue to avoid AV nodal blocking agents. ? 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  ? ?COPD (chronic obstructive pulmonary disease) ([place]) ?No wheezing.  ?- Cont home nebs ? ?Prostate cancer ([place]) ?- Continue prednisone, zytiga (patient supplied) ? ?CAD (coronary artery disease) ?Last LHC (2021) = mod CAD, except severe in PDA that was too small for them to intervene on with stent. Had preoperative stress testing Jan 2023 which was low risk.  ?- Cardiology titrating imdur adding ranexa ?- Continue ASA, statin ? ?Obesity: Estimated body mass index is 35.13 kg/m? as calculated from the following: ?  Height as of this encounter: '5\' 9"'$  (1.753 m). ?  Weight as of this encounter: 107.9 kg. ? ?Subjective: No chest pain currently. Has not gotten up as of this morning. No fainting. No palpitations.  ? ?Objective: ?Vitals:  ? 06/27/21 0602 06/27/21 0731 06/27/21 1150 06/27/21 1642  ?BP: (!) 172/77 140/72 (!) 164/74 (!) 161/73  ?Pulse: (!) 49 65 (!) 43 66  ?Resp: '20 18  12   '$ ?Temp: 98.3 ?F (36.8 ?C) 97.6 ?F (36.4 ?C) (!) 97.5 ?F (36.4 ?C) 98.4 ?F (36.9 ?C)  ?TempSrc: Oral Oral Oral Oral  ?SpO2: 96% 94% 98% 97%  ?Weight:      ?Height:      ? ?Gen: Elderly, obese, pleasant male in no distress ?Pulm: Nonlabored breathing room air.  ?CV: Irreg bradycardia without murmur, rub, or gallop. No JVD, no pitting dependent edema. ?GI: Abdomen soft, non-tender, non-distended, with normoactive bowel sounds.  ?Ext: Warm, no deformities ?Skin: No rashes, lesions or ulcers on visualized skin. ?Neuro: Alert and oriented. No focal neurological deficits. ?Psych: Judgement and insight appear fair. Mood euthymic & affect congruent. Behavior is appropriate.   ? ?Data Personally reviewed: ? ?CBC: ?Recent Labs  ?Lab 06/26/21 ?1906  ?WBC 5.4  ?HGB 10.3*  ?HCT 33.3*  ?MCV 72.4*  ?PLT 183  ? ?Basic Metabolic Panel: ?Recent Labs  ?Lab 06/26/21 ?1906  ?NA 140  ?K 3.4*  ?CL 104  ?CO2 27  ?GLUCOSE 84  ?BUN 8  ?CREATININE 1.02  ?CALCIUM 8.9  ?MG 1.9  ?PHOS 3.0  ? ?GFR: ?Estimated Creatinine Clearance: 68.8 mL/min (by C-G formula based on SCr of 1.02 mg/dL). ?Liver Function Tests: ?Recent Labs  ?Lab 06/26/21 ?1906  ?AST 15  ?ALT 11  ?ALKPHOS 82  ?BILITOT 0.2*  ?PROT 6.5  ?ALBUMIN 3.5  ? ?No results for input(s): LIPASE, AMYLASE in the last 168 hours. ?No results for input(s): AMMONIA in the last 168 hours. ?Coagulation Profile: ?No results for input(s): INR, PROTIME in the last 168 hours. ?Cardiac Enzymes: ?  No results for input(s): CKTOTAL, CKMB, CKMBINDEX, TROPONINI in the last 168 hours. ?BNP (last 3 results) ?No results for input(s): PROBNP in the last 8760 hours. ?HbA1C: ?No results for input(s): HGBA1C in the last 72 hours. ?CBG: ?Recent Labs  ?Lab 06/26/21 ?1820  ?GLUCAP 99  ? ?Lipid Profile: ?No results for input(s): CHOL, HDL, LDLCALC, TRIG, CHOLHDL, LDLDIRECT in the last 72 hours. ?Thyroid Function Tests: ?No results for input(s): TSH, T4TOTAL, FREET4, T3FREE, THYROIDAB in the last 72 hours. ?Anemia Panel: ?No  results for input(s): VITAMINB12, FOLATE, FERRITIN, TIBC, IRON, RETICCTPCT in the last 72 hours. ?Urine analysis: ?   ?Component Value Date/Time  ? [place] YELLOW 04/06/2020 1235  ? APPEARANCEUR CLEAR 04/06/2020 1235  ? LABSPEC 1.015 04/06/2020 1235  ? LABSPEC 1.015 08/24/2017 0946  ? PHURINE 6.0 04/06/2020 1235  ? GLUCOSEU NEGATIVE 04/06/2020 1235  ? [place] NEGATIVE 04/06/2020 1235  ? [place] NEGATIVE 04/06/2020 1235  ? BILIRUBINUR negative 08/24/2017 0946  ? BILIRUBINUR n 09/10/2016 1415  ? [place] NEGATIVE 04/06/2020 1235  ? PROTEINUR NEGATIVE 04/06/2020 1235  ? UROBILINOGEN negative (A) 09/10/2016 1415  ? UROBILINOGEN 0.2 06/15/2012 1124  ? NITRITE NEGATIVE 04/06/2020 1235  ? LEUKOCYTESUR NEGATIVE 04/06/2020 1235  ? ?No results found for this or any previous visit (from the past 240 hour(s)).   ?DG Chest Port 1 View ? ?Result Date: 06/26/2021 ?CLINICAL DATA:  Central chest pain that radiates to the back. EXAM: PORTABLE CHEST 1 VIEW COMPARISON:  April 14, 2021 FINDINGS: The heart size and mediastinal contours are within normal limits. Both lungs are clear. Degenerative changes seen throughout the thoracic spine. IMPRESSION: No active disease. Electronically Signed   By: Virgina Norfolk M.D.   On: 06/26/2021 19:20   ? ?Family Communication: None at bedside ? ?Disposition: ?Status is: Inpatient ?Remains inpatient appropriate because: Cardiology recommending monitoring telemetry overnight before sending home.  ?Planned Discharge Destination: Home ? ?Patrecia Pour, MD ?06/27/2021 5:42 PM ?Page by [email or web address]  ?

## 2021-06-27 NOTE — Progress Notes (Signed)
Dr Alcario Drought made aware patient 's heart rate per monitor  fluctuating /dropping rate to 28- 69 beats per min , 2nd degree AVB  mobitz 1 while sleeping ,with slight decrease in O2 sat also noted (89-96 % )patient denies any discomforts. Cardiology to see pt this am per above Md , EKG done.Continue to monitor. ?

## 2021-06-27 NOTE — Consult Note (Addendum)
?Cardiology Consultation:  ? ?[name] ?MRN: [id number]; DOB: 02/10/1941 ? ?Admit date: 06/26/2021 ?Date of Consult: 06/27/2021 ? ?PCP:  Irene Pap, PA-C ?  ?[facility] Providers ?Cardiologist:  Lauree Chandler, MD  ? ?Patient Profile:  ? ?Joel [name] is a 81 y.o. male with a hx of CAD, PAD, DM, HTN, HLD, CKD 2, COPD, iron deficiency anemia, MALT of orbit, and prior prostate cancer who is being seen 06/27/2021 for the evaluation of chest pain found to be in heart block at the request of Dr. Bonner Puna. ? ?History of Present Illness:  ? ?Mr. Troop has a history of CAD with heart cath February 2014 showing severe OM 2 stenosis treated with a BMS.  Since then he has had chronic angina with multiple ER visits.  Repeat heart cath 07/2013 with patent stent and mild to moderate disease in the LAD and RCA.  Nuclear stress test 02/2017 with inferior defect consistent with ischemia and possible soft tissue attenuation.  Continue to have multiple ER visits for chest pain.  Heart cath 07/2019 with moderate diffuse CAD but no focally obstructive lesions except in the PDA which was too small for stenting.  He does have a history of left pop-tibial bypass 2011.  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  ? ?He was last seen in clinic with Dr. Angelena Form 03/27/2021 and was doing well at that time.  He continued to report chest pain several days per week that was felt to be his baseline chest pain.  Dr. Angelena Form was asked for preoperative cardiovascular risk assessment for possible upcoming abdominal aortic aneurysm surgery with [facility].  In anticipation of the surgery, nuclear stress test was ordered and performed on 04/17/2021.  Nuclear stress test showed normal LV perfusion and no evidence of ischemia. ? ?He presented to [facility] 06/26/21 with intermittent chest pain x 1 day with no association with eating. EKG  with HR int he 40s and and second degree type 1 heart block. Pt denied dizziness and lightheadedness. No syncope. Cardiology was consulted. ? ?HS troponin x 2 negative ? ?At bed side he noted an episode of midsternal chest pain. He notes he wanted to have it checked out. No reports of syncope. ? ? ? ? ?Past Medical History:  ?Diagnosis Date  ? Atypical chest pain   ? CAD (coronary artery disease)   ? a. Multiple prior evaluations then 05/2012 s/p BMS to OM2. b. Nuc 11/2012 wnl. c. Cath 2015: patent stent, moderate LAD/RCA dz, treated medically.  ? Cataract   ? CKD (chronic kidney disease), stage II   ? COPD (chronic obstructive pulmonary disease) ([place])   ? Cough secondary to angiotensin converting enzyme inhibitor (ACE-I)   ? Diabetes mellitus ([place]) 09/07/2019  ? Diabetes mellitus without complication ([place])   ? Diverticulosis   ? DVT (deep venous thrombosis) ([place])   ? Family history of breast cancer in first degree relative   ? GERD (gastroesophageal reflux disease)   ? Headache(784.0)   ? Hx: of  ? History of blood transfusion   ? "I've had 2; don't know what it was related to" (11/09/2012)  ? HLD (hyperlipidemia)   ? HTN (hypertension)   ? Iron deficiency anemia   ? Lymphoproliferative disorder, low grade B cell ([place]) 05/21/2020  ? Microcytic anemia   ? Peripheral vascular disease ([place])   ? a. L pop-tibial bypass  2011, followed by [facility].  ? Personal history of prostate cancer   ? Rotator cuff injury   ? "left arm; never repaired" (11/09/2012)  ? ? ?Past Surgical History:  ?Procedure Laterality Date  ? CARDIOVASCULAR STRESS TEST  Aug. 2014  ? CATARACT EXTRACTION W/ INTRAOCULAR LENS IMPLANT Right ~ 2008  ? CIRCUMCISION    ? COLONOSCOPY    ? CORONARY ANGIOPLASTY WITH STENT PLACEMENT  2014  ? "1" (11/09/2012)  ? ILIAC ARTERY ANEURYSM REPAIR    ? LEFT HEART CATH  05-31-12  ? LEFT HEART CATH AND CORONARY ANGIOGRAPHY N/A 08/01/2019  ? Procedure: LEFT HEART CATH AND CORONARY ANGIOGRAPHY;  Surgeon: Burnell Blanks, MD;   Location: [facility];  Service: Cardiovascular;  Laterality: N/A;  ? LEFT HEART CATHETERIZATION WITH CORONARY ANGIOGRAM N/A 06/01/2011  ? Procedure: LEFT HEART CATHETERIZATION WITH CORONARY ANGIOGRAM;  Surgeon: Hillary Bow, MD;  Location: [facility] CATH LAB;  Service: Cardiovascular;  Laterality: N/A;  ? LEFT HEART CATHETERIZATION WITH CORONARY ANGIOGRAM N/A 05/31/2012  ? Procedure: LEFT HEART CATHETERIZATION WITH CORONARY ANGIOGRAM;  Surgeon: Peter M Martinique, MD;  Location: [facility] CATH LAB;  Service: Cardiovascular;  Laterality: N/A;  ? LEFT HEART CATHETERIZATION WITH CORONARY ANGIOGRAM N/A 08/01/2013  ? Procedure: LEFT HEART CATHETERIZATION WITH CORONARY ANGIOGRAM;  Surgeon: Burnell Blanks, MD;  Location: [facility] CATH LAB;  Service: Cardiovascular;  Laterality: N/A;  ? PERCUTANEOUS CORONARY STENT INTERVENTION (PCI-S)  05/31/2012  ? Procedure: PERCUTANEOUS CORONARY STENT INTERVENTION (PCI-S);  Surgeon: Peter M Martinique, MD;  Location: [facility] CATH LAB;  Service: Cardiovascular;;  ? PR VEIN BYPASS GRAFT,AORTO-FEM-POP Left 10/01/2009  ? left below knee popliteal artery to posterior tibial artery  ? SHOULDER ARTHROSCOPY WITH OPEN ROTATOR CUFF REPAIR AND DISTAL CLAVICLE ACROMINECTOMY Left 01/12/2013  ? Procedure: LEFT SHOULDER ARTHROSCOPY WITH DEBRIDEMENT OPEN DISTAL CLAVICLE RESECTION ,acromioplastyAND ROTATOR CUFF REPAIR;  Surgeon: Yvette Rack., MD;  Location: [facility];  Service: Orthopedics;  Laterality: Left;  ?  ? ?Home Medications:  ?Prior to Admission medications   ?Medication Sig Start Date End Date Taking? Authorizing Provider  ?abiraterone acetate (ZYTIGA) 250 MG tablet Take 1,000 mg by mouth daily.   Yes [provider]  ?albuterol (VENTOLIN HFA) 108 (90 Base) MCG/ACT inhaler INHALE 2 PUFFS INTO THE LUNGS EVERY 6 HOURS AS NEEDED FOR WHEEZING OR SHORTNESS OF BREATH ?Patient taking differently: 2 puffs every 6 (six) hours as needed for shortness of breath. 03/15/19  Yes Henson, Vickie L, NP-C  ?amLODipine  (NORVASC) 10 MG tablet Take 1 tablet (10 mg total) by mouth daily. 12/16/20  Yes Burnell Blanks, MD  ?aspirin EC 81 MG tablet Take 81 mg by mouth daily.    Yes [provider]  ?brimonidine (ALPHAGAN) 0.2 % ophthalmic solution Place 1 drop into both eyes 2 (two) times daily. 05/09/20  Yes [provider]  ?diclofenac Sodium (VOLTAREN) 1 % GEL Apply 2 g topically 2 (two) times daily as needed (back pain). 06/16/20  Yes [provider]  ?ferrous sulfate 325 (65 FE) MG tablet Take 325 mg by mouth 2 (two) times daily with a meal.   Yes [provider]  ?gabapentin (NEURONTIN) 600 MG tablet Take 600 mg by mouth 2 (two) times daily.   Yes [provider]  ?isosorbide mononitrate (IMDUR) 60 MG 24 hr tablet TAKE 1 TABLET(60 MG) BY MOUTH DAILY ?Patient taking differently: 60 mg daily. 03/23/21  Yes Burnell Blanks, MD  ?latanoprost (XALATAN) 0.005 % ophthalmic solution Place 1  drop into both eyes at bedtime. 02/28/20  Yes [provider]  ?LORATADINE PO Take 10 mg by mouth at bedtime.   Yes [provider]  ?metFORMIN (GLUCOPHAGE) 500 MG tablet Take 500 mg by mouth 2 (two) times daily. 01/26/21  Yes [provider]  ?nitroGLYCERIN (NITROSTAT) 0.4 MG SL tablet Place 1 tablet (0.4 mg total) under the tongue every 5 (five) minutes as needed for chest pain (max 3 doses). ?Patient taking differently: Place 0.4 mg under the tongue every 5 (five) minutes as needed for chest pain. 10/20/20  Yes Burnell Blanks, MD  ?omeprazole (PRILOSEC) 20 MG capsule Take 20 mg by mouth in the morning and at bedtime.   Yes [provider]  ?polycarbophil (FIBERCON) 625 MG tablet Take 625 mg by mouth as needed for mild constipation.   Yes [provider]  ?pravastatin (PRAVACHOL) 40 MG tablet Take 1 tablet (40 mg total) by mouth every evening. 06/06/20  Yes Burnell Blanks, MD  ?predniSONE (DELTASONE) 5 MG tablet Take 5 mg by mouth  daily. 07/17/20  Yes [provider]  ?tamsulosin (FLOMAX) 0.4 MG CAPS capsule Take 0.4 mg by mouth at bedtime.   Yes [provider]  ?traMADol (ULTRAM) 50 MG tablet TAKE 1 TABLET(50 MG) BY MOUTH EVE

## 2021-06-28 ENCOUNTER — Other Ambulatory Visit: Payer: Self-pay | Admitting: Physician Assistant

## 2021-06-28 DIAGNOSIS — I25110 Atherosclerotic heart disease of native coronary artery with unstable angina pectoris: Secondary | ICD-10-CM | POA: Diagnosis not present

## 2021-06-28 DIAGNOSIS — R079 Chest pain, unspecified: Secondary | ICD-10-CM | POA: Diagnosis not present

## 2021-06-28 DIAGNOSIS — C884 Extranodal marginal zone B-cell lymphoma of mucosa-associated lymphoid tissue [MALT-lymphoma]: Secondary | ICD-10-CM | POA: Diagnosis not present

## 2021-06-28 DIAGNOSIS — I2510 Atherosclerotic heart disease of native coronary artery without angina pectoris: Secondary | ICD-10-CM | POA: Diagnosis not present

## 2021-06-28 DIAGNOSIS — J449 Chronic obstructive pulmonary disease, unspecified: Secondary | ICD-10-CM | POA: Diagnosis not present

## 2021-06-28 DIAGNOSIS — C61 Malignant neoplasm of prostate: Secondary | ICD-10-CM | POA: Diagnosis not present

## 2021-06-28 DIAGNOSIS — I441 Atrioventricular block, second degree: Secondary | ICD-10-CM

## 2021-06-28 LAB — BASIC METABOLIC PANEL
Anion gap: 7 (ref 5–15)
BUN: 8 mg/dL (ref 8–23)
CO2: 29 mmol/L (ref 22–32)
Calcium: 9 mg/dL (ref 8.9–10.3)
Chloride: 103 mmol/L (ref 98–111)
Creatinine, Ser: 1.07 mg/dL (ref 0.61–1.24)
GFR, Estimated: >60 mL/min (ref >60)
Glucose, Bld: 117 mg/dL — ABNORMAL HIGH (ref 70–99)
Potassium: 3.3 mmol/L — ABNORMAL LOW (ref 3.5–5.1)
Sodium: 139 mmol/L (ref 135–145)

## 2021-06-28 MED ORDER — POTASSIUM CHLORIDE CRYS ER 20 MEQ PO TBCR
40.0000 meq | EXTENDED_RELEASE_TABLET | Freq: Once | ORAL | Status: AC
Start: 2021-06-28 — End: 2021-06-28
  Administered 2021-06-28: 40 meq via ORAL
  Filled 2021-06-28: qty 2

## 2021-06-28 MED ORDER — RANOLAZINE ER 500 MG PO TB12: 500.0000 mg | ORAL_TABLET | Freq: Two times a day (BID) | ORAL | 0 refills | Status: DC

## 2021-06-28 MED ORDER — ISOSORBIDE MONONITRATE ER 120 MG PO TB24: 120.0000 mg | ORAL_TABLET | Freq: Every day | ORAL | 0 refills | Status: DC

## 2021-06-28 NOTE — Care Management Obs Status (Signed)
MEDICARE OBSERVATION STATUS NOTIFICATION ? ? ?Patient Details  ?Name: Joel [name] ?MRN: [id number] ?Date of Birth: Aug 25, 1940 ? ? ?Medicare Observation Status Notification Given:    ? ? ? ?Pollie Friar, RN ?06/28/2021, 9:30 AM ?

## 2021-06-28 NOTE — Consult Note (Signed)
?Cardiology Consultation:  ? ?[name] ?MRN: [id number]; DOB: [birth year] ? ?Admit date: 06/26/2021 ?Date of Consult: 06/28/2021 ? ?PCP:  Irene Pap, PA-C ?  ?[facility] Providers ?Cardiologist:  Lauree Chandler, MD      ? ? ?Patient Profile:  ? ?Joel [name] is a 81 y.o. male with a hx of CAD who is being seen 06/28/2021 for the evaluation of atypical chest pain and variable AV block at the request of Dr. Harl Bowie. ? ?History of Present Illness:  ? ?Joel [name] is a pleasant 81 yo man who was admitted with atypical chest pain. He has a h/o CAD with multiple stents. On presentation he notes that his pain is different than prior episodes of [place] and his cardiac markers have been negative. He was noted to have periods of high grade heart block as well as 1:1 conduction and AVWB. He denies sob with exertion although he admits to being sedentary. He fell asleep several times during my history taking. He lives by himself and denies a h/o snoring. ? ? ?Past Medical History:  ?Diagnosis Date  ? Atypical chest pain   ? CAD (coronary artery disease)   ? a. Multiple prior evaluations then 05/2012 s/p BMS to OM2. b. Nuc 11/2012 wnl. c. Cath 2015: patent stent, moderate LAD/RCA dz, treated medically.  ? Cataract   ? CKD (chronic kidney disease), stage II   ? COPD (chronic obstructive pulmonary disease) ([place])   ? Cough secondary to angiotensin converting enzyme inhibitor (ACE-I)   ? Diabetes mellitus ([place]) 09/07/2019  ? Diabetes mellitus without complication ([place])   ? Diverticulosis   ? DVT (deep venous thrombosis) ([place])   ? Family history of breast cancer in first degree relative   ? GERD (gastroesophageal reflux disease)   ? Headache(784.0)   ? Hx: of  ? History of blood transfusion   ? "I've had 2; don't know what it was related to" (11/09/2012)  ? HLD (hyperlipidemia)   ? HTN (hypertension)   ? Iron deficiency anemia   ? Lymphoproliferative disorder, low grade B cell ([place]) 05/21/2020  ?  Microcytic anemia   ? Peripheral vascular disease ([place])   ? a. L pop-tibial bypass 2011, followed by [facility].  ? Personal history of prostate cancer   ? Rotator cuff injury   ? "left arm; never repaired" (11/09/2012)  ? ? ?Past Surgical History:  ?Procedure Laterality Date  ? CARDIOVASCULAR STRESS TEST  Aug. 2014  ? CATARACT EXTRACTION W/ INTRAOCULAR LENS IMPLANT Right ~ 2008  ? CIRCUMCISION    ? COLONOSCOPY    ? CORONARY ANGIOPLASTY WITH STENT PLACEMENT  2014  ? "1" (11/09/2012)  ? ILIAC ARTERY ANEURYSM REPAIR    ? LEFT HEART CATH  05-31-12  ? LEFT HEART CATH AND CORONARY ANGIOGRAPHY N/A 08/01/2019  ? Procedure: LEFT HEART CATH AND CORONARY ANGIOGRAPHY;  Surgeon: Burnell Blanks, MD;  Location: [facility];  Service: Cardiovascular;  Laterality: N/A;  ? LEFT HEART CATHETERIZATION WITH CORONARY ANGIOGRAM N/A 06/01/2011  ? Procedure: LEFT HEART CATHETERIZATION WITH CORONARY ANGIOGRAM;  Surgeon: Hillary Bow, MD;  Location: [facility] CATH LAB;  Service: Cardiovascular;  Laterality: N/A;  ? LEFT HEART CATHETERIZATION WITH CORONARY ANGIOGRAM N/A 05/31/2012  ? Procedure: LEFT HEART CATHETERIZATION WITH CORONARY ANGIOGRAM;  Surgeon: Peter M Martinique, MD;  Location: [facility] CATH LAB;  Service: Cardiovascular;  Laterality: N/A;  ? LEFT HEART CATHETERIZATION WITH CORONARY ANGIOGRAM N/A 08/01/2013  ? Procedure: LEFT HEART CATHETERIZATION WITH CORONARY ANGIOGRAM;  Surgeon: Burnell Blanks, MD;  Location: [facility] CATH LAB;  Service: Cardiovascular;  Laterality: N/A;  ? PERCUTANEOUS CORONARY STENT INTERVENTION (PCI-S)  05/31/2012  ? Procedure: PERCUTANEOUS CORONARY STENT INTERVENTION (PCI-S);  Surgeon: Peter M Martinique, MD;  Location: [facility] CATH LAB;  Service: Cardiovascular;;  ? PR VEIN BYPASS GRAFT,AORTO-FEM-POP Left 10/01/2009  ? left below knee popliteal artery to posterior tibial artery  ? SHOULDER ARTHROSCOPY WITH OPEN ROTATOR CUFF REPAIR AND DISTAL CLAVICLE ACROMINECTOMY Left 01/12/2013  ? Procedure: LEFT SHOULDER ARTHROSCOPY WITH  DEBRIDEMENT OPEN DISTAL CLAVICLE RESECTION ,acromioplastyAND ROTATOR CUFF REPAIR;  Surgeon: Yvette Rack., MD;  Location: [facility];  Service: Orthopedics;  Laterality: Left;  ?  ? ?Home Medications:  ?Prior to Admission medications   ?Medication Sig Start Date End Date Taking? Authorizing Provider  ?abiraterone acetate (ZYTIGA) 250 MG tablet Take 1,000 mg by mouth daily.   Yes [provider]  ?albuterol (VENTOLIN HFA) 108 (90 Base) MCG/ACT inhaler INHALE 2 PUFFS INTO THE LUNGS EVERY 6 HOURS AS NEEDED FOR WHEEZING OR SHORTNESS OF BREATH ?Patient taking differently: 2 puffs every 6 (six) hours as needed for shortness of breath. 03/15/19  Yes Henson, Vickie L, NP-C  ?amLODipine (NORVASC) 10 MG tablet Take 1 tablet (10 mg total) by mouth daily. 12/16/20  Yes Burnell Blanks, MD  ?aspirin EC 81 MG tablet Take 81 mg by mouth daily.    Yes [provider]  ?brimonidine (ALPHAGAN) 0.2 % ophthalmic solution Place 1 drop into both eyes 2 (two) times daily. 05/09/20  Yes [provider]  ?diclofenac Sodium (VOLTAREN) 1 % GEL Apply 2 g topically 2 (two) times daily as needed (back pain). 06/16/20  Yes [provider]  ?ferrous sulfate 325 (65 FE) MG tablet Take 325 mg by mouth 2 (two) times daily with a meal.   Yes [provider]  ?gabapentin (NEURONTIN) 600 MG tablet Take 600 mg by mouth 2 (two) times daily.   Yes [provider]  ?latanoprost (XALATAN) 0.005 % ophthalmic solution Place 1 drop into both eyes at bedtime. 02/28/20  Yes [provider]  ?LORATADINE PO Take 10 mg by mouth at bedtime.   Yes [provider]  ?metFORMIN (GLUCOPHAGE) 500 MG tablet Take 500 mg by mouth 2 (two) times daily. 01/26/21  Yes [provider]  ?nitroGLYCERIN (NITROSTAT) 0.4 MG SL tablet Place 1 tablet (0.4 mg total) under the tongue every 5 (five) minutes as needed for chest pain (max 3 doses). ?Patient taking differently: Place 0.4 mg under the tongue every 5  (five) minutes as needed for chest pain. 10/20/20  Yes Burnell Blanks, MD  ?omeprazole (PRILOSEC) 20 MG capsule Take 20 mg by mouth in the morning and at bedtime.   Yes [provider]  ?polycarbophil (FIBERCON) 625 MG tablet Take 625 mg by mouth as needed for mild constipation.   Yes [provider]  ?pravastatin (PRAVACHOL) 40 MG tablet Take 1 tablet (40 mg total) by mouth every evening. 06/06/20  Yes Burnell Blanks, MD  ?predniSONE (DELTASONE) 5 MG tablet Take 5 mg by mouth daily. 07/17/20  Yes [provider]  ?tamsulosin (FLOMAX) 0.4 MG CAPS capsule Take 0.4 mg by mouth at bedtime.   Yes [provider]  ?traMADol (ULTRAM) 50 MG tablet TAKE 1 TABLET(50 MG) BY MOUTH EVERY 8 HOURS AS NEEDED FOR SEVERE PAIN ?Patient taking differently: Take 50 mg by mouth every 6 (six) hours as needed for moderate pain. 02/04/21  Yes Jill Alexanders  C, MD  ?Accu-Chek FastClix Lancets MISC Test once daily 11/23/18   Henson, Vickie L, NP-C  ?isosorbide mononitrate (IMDUR) 120 MG 24 hr tablet Take 1 tablet (120 mg total) by mouth daily. 06/28/21   Patrecia Pour, MD  ?ranolazine (RANEXA) 500 MG 12 hr tablet Take 1 tablet (500 mg total) by mouth 2 (two) times daily. 06/28/21   Patrecia Pour, MD  ? ? ?Inpatient Medications: ?Scheduled Meds: ? abiraterone acetate  1,000 mg Oral Daily  ? amLODipine  10 mg Oral Daily  ? aspirin EC  81 mg Oral Daily  ? brimonidine  1 drop Both Eyes BID  ? enoxaparin (LOVENOX) injection  40 mg Subcutaneous Q24H  ? ferrous sulfate  325 mg Oral BID WC  ? gabapentin  600 mg Oral BID  ? isosorbide mononitrate  120 mg Oral Daily  ? latanoprost  1 drop Both Eyes QHS  ? pantoprazole  40 mg Oral Daily  ? pravastatin  40 mg Oral QPM  ? predniSONE  5 mg Oral Daily  ? ranolazine  500 mg Oral BID  ? tamsulosin  0.4 mg Oral QHS  ? ?Continuous Infusions: ? ?PRN Meds: ?acetaminophen, albuterol, diclofenac Sodium, ondansetron (ZOFRAN) IV, polycarbophil, traMADol ? ?Allergies:     ?Allergies  ?Allergen Reactions  ? Topiramate Shortness Of Breath and Other (See Comments)  ?   leg cramps  ? Atenolol Other (See Comments)  ?  bradycardia  ? Lisinopril Cough  ? Tramadol-Acetaminophen Cough

## 2021-06-28 NOTE — TOC Transition Note (Signed)
Transition of Care (TOC) - CM/SW Discharge Note ? ? ?Patient Details  ?Name: Joel [name] ?MRN: [id number] ?Date of Birth: 05-Dec-1940 ? ?Transition of Care (TOC) CM/SW Contact:  ?Pollie Friar, RN ?Phone Number: ?06/28/2021, 9:34 AM ? ? ?Clinical Narrative:    ?Patient is discharging home with self care. No needs per TOC. ?Pt lives home alone. Denies issues with transportation or home medications. Pt uses a cane.  ?Pt states one of his sisters will provide transport home. ? ? ?Final next level of care: Home/Self Care ?Barriers to Discharge: No Barriers Identified ? ? ?Patient Goals and CMS Choice ?  ?  ?  ? ?Discharge Placement ?  ?           ?  ?  ?  ?  ? ?Discharge Plan and Services ?  ?  ?           ?  ?  ?  ?  ?  ?  ?  ?  ?  ?  ? ?Social Determinants of Health (SDOH) Interventions ?  ? ? ?Readmission Risk Interventions ?No flowsheet data found. ? ? ? ? ?

## 2021-06-28 NOTE — Discharge Summary (Addendum)
?Physician Discharge Summary ?  ?Patient: Joel [name] MRN: [id number] DOB: 10/27/1940  ?Admit date:     06/26/2021  ?Discharge date: 06/28/21  ?Discharge Physician: Patrecia Pour  ? ?PCP: Irene Pap, PA-C  ? ?Recommendations at discharge:  ?Follow up with cardiology for repeat evaluation of chest pain with CAD (no ACS this admission) and consideration of pacemaker for bradycardia/2nd degree AVB (asymptomatic at this time). ? ?Discharge Diagnoses: ?Principal Problem: ?  AV block, [place] 1 ?Active Problems: ?  CAD (coronary artery disease) ?  Prostate cancer ([place]) ?  COPD (chronic obstructive pulmonary disease) ([place]) ?  Sleep related hypoxia ?  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 ?Hospital Course: ?Joel [name] is an 81 y.o. male with a history of CAD s/p stent w/recent reassuring stress test (for preoperative evaluation), T2DM, prostate CA on active treatment, MALT lymphoma of orbit, COPD, HTN who presented to the ED 3/17 with a day of intermittent chest discomfort. Cardiac enzymes were reassuring, though he was noted to have bradycardia with 2nd degree AVB prompting EDP to contact cardiology who recommended admission. Plan is to observe rhythm avoiding AV nodal blocking agents. ? ?Assessment and Plan: ?* AV block, Mob[place] 1 ?No syncope, no hypotension.  ?- EP consulted, will arrange outpatient zio patch. No plans for pacemaker at this time. Ok to discharge today. ?- Not on provocative medications and will continue to avoid AV nodal blocking agents. ? 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  ? ?Sleep related hypoxia ?This is a problem from [place], confirmed while in the hospital.  ?- Cardiology is ordering sleep study for outpatient setting and will arrange for follow up. ? ?COPD (chronic obstructive pulmonary disease) ([place]) ?No wheezing.  ?- Cont home nebs ? ?Prostate  cancer ([place]) ?- Continue prednisone, zytiga (patient supplied) ? ?CAD (coronary artery disease) ?Last LHC (2021) = mod CAD, except severe in PDA that was too small for them to intervene on with stent. Had preoperative stress testing Jan 2023 which was low risk.  ?- Follow up with cardiology, we've increased imdur added ranexa per their recommendations.  ?- Continue ASA, statin ? ?Consultants: Cardiology, Dr. Harl Bowie ?Procedures performed: None  ?Disposition: Home ?Diet recommendation:  ?Cardiac diet ?DISCHARGE MEDICATION: ?Allergies as of 06/28/2021   ? ?   Reactions  ? Topiramate Shortness Of Breath, Other (See Comments)  ?  leg cramps  ? Atenolol Other (See Comments)  ? bradycardia  ? Lisinopril Cough  ? Tramadol-acetaminophen Cough  ? Tolerates plain Tramadol  ? ?  ? ?  ?Medication List  ?  ? ?TAKE these medications   ? ?abiraterone acetate 250 MG tablet ?Commonly known as: ZYTIGA ?Take 1,000 mg by mouth daily. ?  ?Accu-Chek FastClix Lancets Misc ?Test once daily ?  ?albuterol 108 (90 Base) MCG/ACT inhaler ?Commonly known as: VENTOLIN HFA ?INHALE 2 PUFFS INTO THE LUNGS EVERY 6 HOURS AS NEEDED FOR WHEEZING OR SHORTNESS OF BREATH ?What changed:  ?how to take this ?reasons to take this ?  ?amLODipine 10 MG tablet ?Commonly known as: NORVASC ?Take 1 tablet (10 mg total) by mouth daily. ?  ?aspirin EC 81 MG tablet ?Take 81 mg by mouth daily. ?  ?brimonidine 0.2 % ophthalmic solution ?Commonly known as: ALPHAGAN ?Place 1 drop into both eyes 2 (two) times daily. ?  ?diclofenac Sodium 1 % Gel ?Commonly known as: VOLTAREN ?Apply 2 g  topically 2 (two) times daily as needed (back pain). ?  ?ferrous sulfate 325 (65 FE) MG tablet ?Take 325 mg by mouth 2 (two) times daily with a meal. ?  ?gabapentin 600 MG tablet ?Commonly known as: NEURONTIN ?Take 600 mg by mouth 2 (two) times daily. ?  ?isosorbide mononitrate 120 MG 24 hr tablet ?Commonly known as: IMDUR ?Take 1 tablet (120 mg total) by mouth daily. ?What changed:  ?medication  strength ?See the new instructions. ?  ?latanoprost 0.005 % ophthalmic solution ?Commonly known as: XALATAN ?Place 1 drop into both eyes at bedtime. ?  ?LORATADINE PO ?Take 10 mg by mouth at bedtime. ?  ?metFORMIN 500 MG tablet ?Commonly known as: GLUCOPHAGE ?Take 500 mg by mouth 2 (two) times daily. ?  ?nitroGLYCERIN 0.4 MG SL tablet ?Commonly known as: NITROSTAT ?Place 1 tablet (0.4 mg total) under the tongue every 5 (five) minutes as needed for chest pain (max 3 doses). ?What changed: reasons to take this ?  ?omeprazole 20 MG capsule ?Commonly known as: PRILOSEC ?Take 20 mg by mouth in the morning and at bedtime. ?  ?polycarbophil 625 MG tablet ?Commonly known as: FIBERCON ?Take 625 mg by mouth as needed for mild constipation. ?  ?pravastatin 40 MG tablet ?Commonly known as: PRAVACHOL ?Take 1 tablet (40 mg total) by mouth every evening. ?  ?predniSONE 5 MG tablet ?Commonly known as: DELTASONE ?Take 5 mg by mouth daily. ?  ?ranolazine 500 MG 12 hr tablet ?Commonly known as: RANEXA ?Take 1 tablet (500 mg total) by mouth 2 (two) times daily. ?  ?tamsulosin 0.4 MG Caps capsule ?Commonly known as: FLOMAX ?Take 0.4 mg by mouth at bedtime. ?  ?traMADol 50 MG tablet ?Commonly known as: ULTRAM ?TAKE 1 TABLET(50 MG) BY MOUTH EVERY 8 HOURS AS NEEDED FOR SEVERE PAIN ?What changed:  ?how much to take ?how to take this ?when to take this ?reasons to take this ?additional instructions ?  ? ?  ? ? Follow-up Information   ? ? Francis Gaines B, PA-C Follow up.   ?Specialty: Physician Assistant ?Contact information: ?[address] ?[place] [address] ?[phone] ? ? ?  ?  ? ? Burnell Blanks, MD .   ?Specialty: Cardiology ?Contact information: ?[address] ?[address] ?[place] [address] ?[phone] ? ? ?  ?  ? ?  ?  ? ?  ?Subjective: Feels well, no dyspnea, chest pain or palpitations. No lightheadedness or syncope/presyncope. No hypotension. Tele overnight with intermittent bradycardia, sinus, 1st  degree, and wenkebach.  ? ?Discharge Exam: ?BP 123/83 (BP Location: Right Arm)   Pulse 80   Temp (!) 97.4 ?F (36.3 ?C) (Axillary)   Resp 16   Ht '5\' 9"'$  (1.753 m)   Wt 107.9 kg   SpO2 98%   BMI 35.13 kg/m?   ?Well-appearing in no distress ?Regular with occasional dropped beats, rate overall in 60-70's. No JVD or edema ?Clear, nonlabored ?Warm, well-perfused.  ? ?Condition at discharge: stable ? ?The results of significant diagnostics from this hospitalization (including imaging, microbiology, ancillary and laboratory) are listed below for reference.  ? ?Imaging Studies: ?DG Chest Port 1 View ? ?Result Date: 06/26/2021 ?CLINICAL DATA:  Central chest pain that radiates to the back. EXAM: PORTABLE CHEST 1 VIEW COMPARISON:  April 14, 2021 FINDINGS: The heart size and mediastinal contours are within normal limits. Both lungs are clear. Degenerative changes seen throughout the thoracic spine. IMPRESSION: No active disease. Electronically Signed   By: Joyce Gross.D.  On: 06/26/2021 19:20   ? ?Microbiology: ?Results for orders placed or performed in visit on 01/16/21  ?Novel Coronavirus, NAA ([facility])     Status: None  ? Collection Time: 01/16/21  1:00 PM  ? Specimen: Nasopharyngeal(NP) swabs in vial transport medium  ?Result Value Ref Range Status  ? SARS-CoV-2, NAA Not Detected Not Detected Final  ?  Comment: This nucleic acid amplification test was developed and its performance ?characteristics determined by [employer]. Nucleic acid ?amplification tests include RT-PCR and TMA. This test has not been ?FDA cleared or approved. This test has been authorized by FDA under ?an Emergency Use Authorization (EUA). This test is only authorized ?for the duration of time the declaration that circumstances exist ?justifying the authorization of the emergency use of in vitro ?diagnostic tests for detection of SARS-CoV-2 virus and/or diagnosis ?of COVID-19 infection under section 564(b)(1) of the Act, 21  U.S.C. ?360bbb-3(b) (1), unless the authorization is terminated or revoked ?sooner. ?When diagnostic testing is negative, the possibility of a false ?negative result should be considered in the context of a patient's ?r

## 2021-06-28 NOTE — Progress Notes (Addendum)
? ?Progress Note ? ?Patient Name: Joel [name] ?Date of Encounter: 06/28/2021 ? ?[facility] Cardiologist: Lauree Chandler, MD  ? ?Subjective  ? ?Mr. Laday feels well. His chest pain has resolved. Bps tolerated medications changes ? ?Inpatient Medications  ?  ?Scheduled Meds: ? abiraterone acetate  1,000 mg Oral Daily  ? amLODipine  10 mg Oral Daily  ? aspirin EC  81 mg Oral Daily  ? brimonidine  1 drop Both Eyes BID  ? enoxaparin (LOVENOX) injection  40 mg Subcutaneous Q24H  ? ferrous sulfate  325 mg Oral BID WC  ? gabapentin  600 mg Oral BID  ? isosorbide mononitrate  120 mg Oral Daily  ? latanoprost  1 drop Both Eyes QHS  ? pantoprazole  40 mg Oral Daily  ? pravastatin  40 mg Oral QPM  ? predniSONE  5 mg Oral Daily  ? ranolazine  500 mg Oral BID  ? tamsulosin  0.4 mg Oral QHS  ? ?Continuous Infusions: ? ?PRN Meds: ?acetaminophen, albuterol, diclofenac Sodium, ondansetron (ZOFRAN) IV, polycarbophil, traMADol  ? ?Vital Signs  ?  ?Vitals:  ? 06/27/21 1642 06/27/21 2151 06/28/21 1194 06/28/21 0751  ?BP: (!) 161/73 (!) 153/67 130/70 123/83  ?Pulse: 66 66 71 80  ?Resp: '12 19 18 16  '$ ?Temp: 98.4 ?F (36.9 ?C) 98.2 ?F (36.8 ?C) 98.3 ?F (36.8 ?C) (!) 97.4 ?F (36.3 ?C)  ?TempSrc: Oral Oral Oral Axillary  ?SpO2: 97% 91% 91% 98%  ?Weight:      ?Height:      ? ?No intake or output data in the 24 hours ending 06/28/21 1038 ?Last 3 Weights 06/27/2021 06/26/2021 06/15/2021  ?Weight (lbs) 237 lb 14.4 oz 234 lb 242 lb 9.6 oz  ?Weight (kg) 107.911 kg 106.142 kg 110.043 kg  ?   ? ?Telemetry  ?  ?Wenckebach, sinus rhythm, sinus brady - Personally Reviewed ? ?ECG  ?  ?No new - Personally Reviewed ? ?Physical Exam  ? ?Vitals:  ? 06/28/21 0613 06/28/21 0751  ?BP: 130/70 123/83  ?Pulse: 71 80  ?Resp: 18 16  ?Temp: 98.3 ?F (36.8 ?C) (!) 97.4 ?F (36.3 ?C)  ?SpO2: 91% 98%  ? ? ?GEN: No acute distress.   ?Neck: No JVD ?Cardiac: RRR, no murmurs, rubs, or gallops.  ?Respiratory: Clear to auscultation bilaterally. ?GI: Soft,  nontender, non-distended  ?MS: No edema; No deformity. ?Neuro:  Nonfocal  ?Psych: Normal affect  ? ?Labs  ?  ?High Sensitivity Troponin:   ?Recent Labs  ?Lab 06/26/21 ?1906 06/26/21 ?2145  ?TROPONINIHS 11 13  ?   ?Chemistry ?Recent Labs  ?Lab 06/26/21 ?1906 06/28/21 ?0231  ?NA 140 139  ?K 3.4* 3.3*  ?CL 104 103  ?CO2 27 29  ?GLUCOSE 84 117*  ?BUN 8 8  ?CREATININE 1.02 1.07  ?CALCIUM 8.9 9.0  ?MG 1.9  --   ?PROT 6.5  --   ?ALBUMIN 3.5  --   ?AST 15  --   ?ALT 11  --   ?ALKPHOS 82  --   ?BILITOT 0.2*  --   ?GFRNONAA >60 >60  ?ANIONGAP 9 7  ?  ?Lipids No results for input(s): CHOL, TRIG, HDL, LABVLDL, LDLCALC, CHOLHDL in the last 168 hours.  ?Hematology ?Recent Labs  ?Lab 06/26/21 ?1906  ?WBC 5.4  ?RBC 4.60  ?HGB 10.3*  ?HCT 33.3*  ?MCV 72.4*  ?MCH 22.4*  ?MCHC 30.9  ?RDW 16.4*  ?PLT 183  ? ?Thyroid No results for input(s): TSH, FREET4 in the last 168 hours.  ?  BNP ?Recent Labs  ?Lab 06/26/21 ?1906  ?BNP 64.3  ?  ?DDimer No results for input(s): DDIMER in the last 168 hours.  ? ?Radiology  ?  ?DG Chest Port 1 View ? ?Result Date: 06/26/2021 ?CLINICAL DATA:  Central chest pain that radiates to the back. EXAM: PORTABLE CHEST 1 VIEW COMPARISON:  April 14, 2021 FINDINGS: The heart size and mediastinal contours are within normal limits. Both lungs are clear. Degenerative changes seen throughout the thoracic spine. IMPRESSION: No active disease. Electronically Signed   By: Virgina Norfolk M.D.   On: 06/26/2021 19:20   ? ?Cardiac Studies  ? ?Nuclear 04/17/2021 ?  The study is normal. The study is low risk. ?  No ST deviation was noted. ?  LV perfusion is normal. There is no evidence of ischemia. There is no evidence of infarction. ?  Left ventricular function is normal. Nuclear stress EF: 61 %. The left ventricular ejection fraction is normal (55-65%). End diastolic cavity size is normal. End systolic cavity size is normal. ?  Prior study available for comparison from 03/10/2017. ?  ?Normal resting and stress perfusion. No  ischemia or infarction EF ?61% ? ?TTE 08/27/2020 ?1. Left ventricular ejection fraction, by estimation, is 65%. The left  ?ventricle has normal function. The left ventricle has no regional wall  ?motion abnormalities. Left ventricular diastolic parameters are consistent  ?with Grade I diastolic dysfunction  ?(impaired relaxation).  ? 2. Right ventricular systolic function is normal. The right ventricular  ?size is normal.  ? 3. The mitral valve is grossly normal. No evidence of mitral valve  ?regurgitation. No evidence of mitral stenosis.  ? 4. The aortic valve was not well visualized. Aortic valve regurgitation  ?is trivial. No aortic stenosis is present.  ? 5. Aortic dilatation noted. There is mild dilatation of the aortic root,  ?measuring 41 mm. There is mild dilatation of the ascending aorta,  ?measuring 40 mm. This is within normal limits for age and BSA.  ? ?Patient Profile  ?   ?Joel [name] is a 81 y.o. male with a hx of CAD, PAD, DM, HTN, HLD, CKD 2, COPD, iron deficiency anemia, MALT of orbit, and prior prostate cancer who is being seen 06/27/2021 for the evaluation of chest pain at the request of Dr. Bonner Puna. ? ?Assessment & Plan  ?  ?Chest pain: he noted brief chronic anginal symptoms. He has no signs of ACS. He has known dx in the distal LAD dx, severe PDA disease not amenable to PCI. Last cath 08/01/19. Recent stress in January was low risk. 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 Bobette Mo.   ?- no ischemic changes; no plans for ischemic w/u ?- continue increased imdur and ranexa ? ?Wenckebach: has Predominantly Wenckebach. Sinus bradycardia. He is asymptomatic. This is low risk. No plans for PPM. Avoid AVN blocking agents ? ?He is stable from cardiac standpoint for discharge. ? ?[facility] will sign off.   ?Medication Recommendations:  increased imdur added ranexa ?Other recommendations (labs, testing, etc):  none ?Follow up as an  outpatient:  has appointment with Dr. Angelena Form in May and sees Dr. Scot Dock 4/13 ? ?For questions or updates, please contact [place] ?Please consult [email or web address] for contact info under  ? ?  ?   ?Signed, ?Janina Mayo, MD  ?06/28/2021, 10:38 AM   ? ?

## 2021-06-28 NOTE — Assessment & Plan Note (Signed)
This is a problem from [place], confirmed while in the hospital.  ?- Cardiology is ordering sleep study for outpatient setting and will arrange for follow up. ?

## 2021-06-29 ENCOUNTER — Ambulatory Visit (INDEPENDENT_AMBULATORY_CARE_PROVIDER_SITE_OTHER): Payer: Medicare HMO

## 2021-06-29 ENCOUNTER — Other Ambulatory Visit: Payer: Self-pay | Admitting: Physician Assistant

## 2021-06-29 ENCOUNTER — Telehealth: Payer: Self-pay

## 2021-06-29 ENCOUNTER — Encounter: Payer: Self-pay | Admitting: *Deleted

## 2021-06-29 DIAGNOSIS — I441 Atrioventricular block, second degree: Secondary | ICD-10-CM

## 2021-06-29 DIAGNOSIS — H0102B Squamous blepharitis left eye, upper and lower eyelids: Secondary | ICD-10-CM | POA: Diagnosis not present

## 2021-06-29 DIAGNOSIS — H401231 Low-tension glaucoma, bilateral, mild stage: Secondary | ICD-10-CM | POA: Diagnosis not present

## 2021-06-29 DIAGNOSIS — Z961 Presence of intraocular lens: Secondary | ICD-10-CM | POA: Diagnosis not present

## 2021-06-29 DIAGNOSIS — R001 Bradycardia, unspecified: Secondary | ICD-10-CM

## 2021-06-29 DIAGNOSIS — H35033 Hypertensive retinopathy, bilateral: Secondary | ICD-10-CM | POA: Diagnosis not present

## 2021-06-29 DIAGNOSIS — H524 Presbyopia: Secondary | ICD-10-CM | POA: Diagnosis not present

## 2021-06-29 DIAGNOSIS — H0102A Squamous blepharitis right eye, upper and lower eyelids: Secondary | ICD-10-CM | POA: Diagnosis not present

## 2021-06-29 DIAGNOSIS — C6982 Malignant neoplasm of overlapping sites of left eye and adnexa: Secondary | ICD-10-CM | POA: Diagnosis not present

## 2021-06-29 DIAGNOSIS — H43811 Vitreous degeneration, right eye: Secondary | ICD-10-CM | POA: Diagnosis not present

## 2021-06-29 DIAGNOSIS — H16223 Keratoconjunctivitis sicca, not specified as Sjogren's, bilateral: Secondary | ICD-10-CM | POA: Diagnosis not present

## 2021-06-29 NOTE — Telephone Encounter (Signed)
I called pt. Per my Pt. Ping report he was recently in the hospital for AV block. Pt. Stated he was currently at the eye doctor and would have to call back later to schedule.  ?

## 2021-06-29 NOTE — Progress Notes (Deleted)
? ?Cardiology Office Note   ? ?Date:  06/29/2021  ? ?ID:  Joel [name], DOB 1940/10/30, MRN [id number] ? ? ?PCP:  Irene Pap, PA-C ?  ?[place]  ?Cardiologist:  Lauree Chandler, MD *** ?Advanced Practice Provider:  No care team member to display ?Electrophysiologist:  None  ? ?[id number]}  ? ?No chief complaint on file. ? ? ?History of Present Illness:  ?Joel [name] is a 81 y.o. male *** ? ? ? ?Past Medical History:  ?Diagnosis Date  ? Atypical chest pain   ? CAD (coronary artery disease)   ? a. Multiple prior evaluations then 05/2012 s/p BMS to OM2. b. Nuc 11/2012 wnl. c. Cath 2015: patent stent, moderate LAD/RCA dz, treated medically.  ? Cataract   ? CKD (chronic kidney disease), stage II   ? COPD (chronic obstructive pulmonary disease) ([place])   ? Cough secondary to angiotensin converting enzyme inhibitor (ACE-I)   ? Diabetes mellitus ([place]) 09/07/2019  ? Diabetes mellitus without complication ([place])   ? Diverticulosis   ? DVT (deep venous thrombosis) ([place])   ? Family history of breast cancer in first degree relative   ? GERD (gastroesophageal reflux disease)   ? Headache(784.0)   ? Hx: of  ? History of blood transfusion   ? "I've had 2; don't know what it was related to" (11/09/2012)  ? HLD (hyperlipidemia)   ? HTN (hypertension)   ? Iron deficiency anemia   ? Lymphoproliferative disorder, low grade B cell ([place]) 05/21/2020  ? Microcytic anemia   ? Peripheral vascular disease ([place])   ? a. L pop-tibial bypass 2011, followed by [facility].  ? Personal history of prostate cancer   ? Rotator cuff injury   ? "left arm; never repaired" (11/09/2012)  ? ? ?Past Surgical History:  ?Procedure Laterality Date  ? CARDIOVASCULAR STRESS TEST  Aug. 2014  ? CATARACT EXTRACTION W/ INTRAOCULAR LENS IMPLANT Right ~ 2008  ? CIRCUMCISION    ? COLONOSCOPY    ? CORONARY ANGIOPLASTY WITH STENT PLACEMENT  2014  ? "1" (11/09/2012)  ? ILIAC ARTERY ANEURYSM REPAIR    ? LEFT HEART CATH  05-31-12  ? LEFT HEART CATH  AND CORONARY ANGIOGRAPHY N/A 08/01/2019  ? Procedure: LEFT HEART CATH AND CORONARY ANGIOGRAPHY;  Surgeon: Burnell Blanks, MD;  Location: [facility];  Service: Cardiovascular;  Laterality: N/A;  ? LEFT HEART CATHETERIZATION WITH CORONARY ANGIOGRAM N/A 06/01/2011  ? Procedure: LEFT HEART CATHETERIZATION WITH CORONARY ANGIOGRAM;  Surgeon: Hillary Bow, MD;  Location: [facility] CATH LAB;  Service: Cardiovascular;  Laterality: N/A;  ? LEFT HEART CATHETERIZATION WITH CORONARY ANGIOGRAM N/A 05/31/2012  ? Procedure: LEFT HEART CATHETERIZATION WITH CORONARY ANGIOGRAM;  Surgeon: Peter M Martinique, MD;  Location: [facility] CATH LAB;  Service: Cardiovascular;  Laterality: N/A;  ? LEFT HEART CATHETERIZATION WITH CORONARY ANGIOGRAM N/A 08/01/2013  ? Procedure: LEFT HEART CATHETERIZATION WITH CORONARY ANGIOGRAM;  Surgeon: Burnell Blanks, MD;  Location: [facility] CATH LAB;  Service: Cardiovascular;  Laterality: N/A;  ? PERCUTANEOUS CORONARY STENT INTERVENTION (PCI-S)  05/31/2012  ? Procedure: PERCUTANEOUS CORONARY STENT INTERVENTION (PCI-S);  Surgeon: Peter M Martinique, MD;  Location: [facility] CATH LAB;  Service: Cardiovascular;;  ? PR VEIN BYPASS GRAFT,AORTO-FEM-POP Left 10/01/2009  ? left below knee popliteal artery to posterior tibial artery  ? SHOULDER ARTHROSCOPY WITH OPEN ROTATOR CUFF REPAIR AND DISTAL CLAVICLE ACROMINECTOMY Left 01/12/2013  ? Procedure: LEFT SHOULDER ARTHROSCOPY WITH DEBRIDEMENT OPEN DISTAL CLAVICLE RESECTION ,acromioplastyAND ROTATOR CUFF REPAIR;  Surgeon: Yvette Rack., MD;  Location: [place];  Service: Orthopedics;  Laterality: Left;  ? ? ?Current Medications: ?No outpatient medications have been marked as taking for the 06/30/21 encounter (Appointment) with Imogene Burn, PA-C.  ?  ? ?Allergies:   Topiramate, Atenolol, Lisinopril, and Tramadol-acetaminophen  ? ?Social History  ? ?Socioeconomic History  ? Marital status: Widowed  ?  Spouse name: Not on file  ? Number of children: Not on file  ? Years of education:  Not on file  ? Highest education level: 12th grade  ?Occupational History  ? Occupation: Retired  ?Tobacco Use  ? Smoking status: Former  ?  Packs/day: 0.50  ?  Years: 52.00  ?  Pack years: 26.00  ?  Types: Cigarettes  ?  Quit date: 05/26/2011  ?  Years since quitting: 10.1  ?  Passive exposure: Past  ? Smokeless tobacco: Never  ?Vaping Use  ? Vaping Use: Never used  ?Substance and Sexual Activity  ? Alcohol use: No  ? Drug use: No  ? Sexual activity: Not on file  ?Other Topics Concern  ? Not on file  ?Social History Narrative  ? Single. Retired. 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  Lives in [place] by himself.  ?   ? Patient writes with his right hand, though uses his left hand for every thing else. He lives alone in a one level home. He drinks 3 cups of coffee a day and 3 7-8 oz sodas a day. He does not exercise.  ? ?Social Determinants of Health  ? ?Financial Resource Strain: Low Risk   ? Difficulty of Paying Living Expenses: Not hard at all  ?Food Insecurity: No Food Insecurity  ? Worried About Charity fundraiser in the Last Year: Never true  ? Ran Out of Food in the Last Year: Never true  ?Transportation Needs: No Transportation Needs  ? Lack of Transportation (Medical): No  ? Lack of Transportation (Non-Medical): No  ?Physical Activity: Inactive  ? Days of Exercise per Week: 0 days  ? Minutes of Exercise per Session: 0 min  ?Stress: No Stress Concern Present  ? Feeling of Stress : Not at all  ?Social Connections: Not on file  ?  ? ?Family History:  The patient's ***family history includes Breast cancer in his mother; COPD in his father; Cancer (age of onset: 9) in his brother; Cancer (age of onset: 86) in his brother; Hyperlipidemia in his father and mother; Hypertension in his brother, daughter, father, mother, sister, and son.  ? ?ROS:   ?Please see the history of present illness.    ?ROS All other systems reviewed and are negative. ? ? ?PHYSICAL EXAM:   ?VS:  There were no vitals taken for this visit.  ?Physical  Exam  ?GEN: Well nourished, well developed, in no acute distress  ?HEENT: normal  ?Neck: no JVD, carotid bruits, or masses ?Cardiac:RRR; no murmurs, rubs, or gallops  ?Respiratory:  clear to auscultation bilaterally, normal work of breathing ?GI: soft, nontender, nondistended, + BS ?Ext: without cyanosis, clubbing, or edema, Good distal pulses bilaterally ?MS: no deformity or atrophy  ?Skin: warm and dry, no rash ?Neuro:  Alert and Oriented x 3, Strength and sensation are intact ?Psych: euthymic mood, full affect ? ?Wt Readings from Last 3 Encounters:  ?06/27/21 237 lb 14.4 oz (107.9 kg)  ?06/15/21 242 lb 9.6 oz (110 kg)  ?05/29/21 232 lb (105.2 kg)  ?  ? ? ?Studies/Labs Reviewed:  ? ?EKG:  EKG is*** ordered today.  The ekg ordered today demonstrates *** ? ?Recent Labs: ?07/17/2020: TSH 1.620 ?06/26/2021: ALT 11; B Natriuretic Peptide 64.3; Hemoglobin 10.3; Magnesium 1.9; Platelets 183 ?06/28/2021: BUN 8; Creatinine, Ser 1.07; Potassium 3.3; Sodium 139  ? ?Lipid Panel ?   ?Component Value Date/Time  ? CHOL 147 06/15/2021 1211  ? TRIG 268 (H) 06/15/2021 1211  ? HDL 37 (L) 06/15/2021 1211  ? CHOLHDL 4.0 06/15/2021 1211  ? CHOLHDL 5 02/06/2013 0741  ? VLDL 24.4 02/06/2013 0741  ? [place] 67 06/15/2021 1211  ? ? ?Additional studies/ records that were reviewed today include:  ?*** ? ? ?Risk Assessment/Calculations:   ?{Does this patient have ATRIAL FIBRILLATION?[phone]} ? ? ? ? ?ASSESSMENT:   ? ?No diagnosis found. ? ? ?PLAN:  ?In order of problems listed above: ? ? ? ?Shared Decision Making/Informed Consent   ?{Are you ordering a CV Procedure (e.g. stress test, cath, DCCV, TEE, etc)?   Press F2        :[id number]}  ? ? ?Medication Adjustments/Labs and Tests Ordered: ?Current medicines are reviewed at length with the patient today.  Concerns regarding medicines are outlined above.  Medication changes, Labs and Tests ordered today are listed in the Patient Instructions below. ?There are no Patient Instructions on file for  this visit.  ? ?Signed, ?Ermalinda Barrios, PA-C  ?06/29/2021 2:17 PM    ?[facility] ?[address], [place], [place]  [address] ?Phone: [phone]; Fax: [phone]  ? ? ?

## 2021-06-29 NOTE — Telephone Encounter (Signed)
Okay.  Thanks.

## 2021-06-29 NOTE — Progress Notes (Unsigned)
Enrolled for [employer] to mail a ZIO XT long term holter monitor to the patients address on file.  ?Letter with instructions mailed to patient ? ?Dr. Lovena Le to read.  ?CC results to Dr. Angelena Form ?

## 2021-06-30 ENCOUNTER — Ambulatory Visit: Payer: Medicare HMO | Admitting: Physician Assistant

## 2021-07-01 DIAGNOSIS — M5116 Intervertebral disc disorders with radiculopathy, lumbar region: Secondary | ICD-10-CM | POA: Diagnosis not present

## 2021-07-01 DIAGNOSIS — M5416 Radiculopathy, lumbar region: Secondary | ICD-10-CM | POA: Diagnosis not present

## 2021-07-03 DIAGNOSIS — R001 Bradycardia, unspecified: Secondary | ICD-10-CM

## 2021-07-03 DIAGNOSIS — I441 Atrioventricular block, second degree: Secondary | ICD-10-CM

## 2021-07-16 ENCOUNTER — Telehealth: Payer: Self-pay | Admitting: Pharmacist

## 2021-07-16 NOTE — Progress Notes (Signed)
? ? ?Chronic Care Management ?Pharmacy Assistant  ? ?Name: Joel [name]  MRN: [id number] DOB: 1941-03-22 ? ? ? ?Reason for Encounter: Disease State ?  ?Conditions to be addressed/monitored: ?HTN ? ?Recent office visits:  ?06/15/21 Marcellina Millin - Patient presented for essential hypertension and other concerns ? ?Recent consult visits:  ?06/29/21  Patient presented to [facility] for Long term monitor placement. ? ?06/17/21 [place] [place]  [place] presented for Non-Hodgkin lymphoma and other concerns. No medication changes. ? ? ?Hospital visits:  ?Medication Reconciliation was completed by comparing discharge summary, patient?s EMR and Pharmacy list, and upon discussion with patient. ? ?Patient presented to [facility] on 06/26/21 due to AV block [place] 1. Patient was present for 2 days. ? ?New?Medications Started at [facility] Discharge:?? ?-started ?ranolazine (RANEXA) ? ?Medication Changes at Hospital Discharge: ?-Changed  ?isosorbide mononitrate (IMDUR) ? ?Medications Discontinued at Hospital Discharge: ?-Stopped  ?none ? ?Medications that remain the same after Hospital Discharge:??  ?-All other medications will remain the same.   ? ?Medications: ?Outpatient Encounter Medications as of 07/16/2021  ?Medication Sig  ? abiraterone acetate (ZYTIGA) 250 MG tablet Take 1,000 mg by mouth daily.  ? Accu-Chek FastClix Lancets MISC Test once daily  ? albuterol (VENTOLIN HFA) 108 (90 Base) MCG/ACT inhaler INHALE 2 PUFFS INTO THE LUNGS EVERY 6 HOURS AS NEEDED FOR WHEEZING OR SHORTNESS OF BREATH (Patient taking differently: 2 puffs every 6 (six) hours as needed for shortness of breath.)  ? amLODipine (NORVASC) 10 MG tablet Take 1 tablet (10 mg total) by mouth daily.  ? aspirin EC 81 MG tablet Take 81 mg by mouth daily.   ? brimonidine (ALPHAGAN) 0.2 % ophthalmic solution Place 1 drop into both eyes 2 (two) times daily.  ? diclofenac Sodium (VOLTAREN) 1 % GEL Apply 2 g topically 2 (two) times  daily as needed (back pain).  ? ferrous sulfate 325 (65 FE) MG tablet Take 325 mg by mouth 2 (two) times daily with a meal.  ? gabapentin (NEURONTIN) 600 MG tablet Take 600 mg by mouth 2 (two) times daily.  ? isosorbide mononitrate (IMDUR) 120 MG 24 hr tablet Take 1 tablet (120 mg total) by mouth daily.  ? latanoprost (XALATAN) 0.005 % ophthalmic solution Place 1 drop into both eyes at bedtime.  ? LORATADINE PO Take 10 mg by mouth at bedtime.  ? metFORMIN (GLUCOPHAGE) 500 MG tablet Take 500 mg by mouth 2 (two) times daily.  ? nitroGLYCERIN (NITROSTAT) 0.4 MG SL tablet Place 1 tablet (0.4 mg total) under the tongue every 5 (five) minutes as needed for chest pain (max 3 doses). (Patient taking differently: Place 0.4 mg under the tongue every 5 (five) minutes as needed for chest pain.)  ? omeprazole (PRILOSEC) 20 MG capsule Take 20 mg by mouth in the morning and at bedtime.  ? polycarbophil (FIBERCON) 625 MG tablet Take 625 mg by mouth as needed for mild constipation.  ? pravastatin (PRAVACHOL) 40 MG tablet Take 1 tablet (40 mg total) by mouth every evening.  ? predniSONE (DELTASONE) 5 MG tablet Take 5 mg by mouth daily.  ? ranolazine (RANEXA) 500 MG 12 hr tablet Take 1 tablet (500 mg total) by mouth 2 (two) times daily.  ? tamsulosin (FLOMAX) 0.4 MG CAPS capsule Take 0.4 mg by mouth at bedtime.  ? traMADol (ULTRAM) 50 MG tablet TAKE 1 TABLET(50 MG) BY MOUTH EVERY 8 HOURS AS NEEDED FOR SEVERE PAIN (Patient taking differently: Take 50 mg by mouth  every 6 (six) hours as needed for moderate pain.)  ? ?No facility-administered encounter medications on file as of 07/16/2021.  ?Reviewed chart prior to disease state call. Spoke with patient regarding BP ? ?Recent Office Vitals: ?BP Readings from Last 3 Encounters:  ?06/28/21 123/83  ?06/26/21 (!) 173/73  ?06/15/21 (!) 110/50  ? ?Pulse Readings from Last 3 Encounters:  ?06/28/21 80  ?06/26/21 (!) 57  ?06/15/21 60  ?  ?Wt Readings from Last 3 Encounters:  ?06/27/21 237 lb 14.4 oz  (107.9 kg)  ?06/15/21 242 lb 9.6 oz (110 kg)  ?05/29/21 232 lb (105.2 kg)  ?  ? ?Kidney Function ?Lab Results  ?Component Value Date/Time  ? CREATININE 1.07 06/28/2021 02:31 AM  ? CREATININE 1.02 06/26/2021 07:06 PM  ? CREATININE 1.3 11/12/2016 09:12 AM  ? CREATININE 1.5 (H) 09/30/2016 03:59 PM  ? GFR 82.50 07/27/2013 09:29 AM  ? GFRNONAA >60 06/28/2021 02:31 AM  ? GFRAA 64 02/14/2020 11:34 AM  ? ? ? ?  Latest Ref Rng & Units 06/28/2021  ?  2:31 AM 06/26/2021  ?  7:06 PM 06/15/2021  ? 12:11 PM  ?BMP  ?Glucose 70 - 99 mg/dL 117   84   137    ?BUN 8 - 23 mg/dL '8   8   10    '$ ?Creatinine 0.61 - 1.24 mg/dL 1.07   1.02   0.96    ?BUN/Creat Ratio 10 - 24   10    ?Sodium 135 - 145 mmol/L 139   140   141    ?Potassium 3.5 - 5.1 mmol/L 3.3   3.4   3.7    ?Chloride 98 - 111 mmol/L 103   104   103    ?CO2 22 - 32 mmol/L '29   27   24    '$ ?Calcium 8.9 - 10.3 mg/dL 9.0   8.9   8.9    ? ? ?Current antihypertensive regimen:  ?Amlodipine 10 mg 1 tablet daily - Appropriate, Query effective, Safe, Accessible ?How often are you checking your Blood Pressure? daily ?Current home BP readings: Patient reports his blood pressures have been looking fine was not in front of the readings for more info. ?BP Readings from Last 3 Encounters:  ?06/28/21 123/83  ?06/26/21 (!) 173/73  ?06/15/21 (!) 110/50  ?He denies any hyper/hypotensive symptoms ?What recent interventions/DTPs have been made by any provider to improve Blood Pressure control since last CPP Visit: Patient reports he did begin taking the iron supplement once a week, he has not noticed any constipation or other issues. He reports he will continue to use once a week energy levels are also good. ?Any recent hospitalizations or ED visits since last visit with CPP? Yes ?What diet changes have been made to improve Blood Pressure Control?  ?Patient reports meal habits unchanged still doing his cooking for the most part at home. ?What exercise is being done to improve your Blood Pressure Control?   ?Patient reports he has been wearing his heart monitor that was given to him with no issue, he reports he is to turn it back in on this Saturday. ? ?Adherence Review: ?Is the patient currently on ACE/ARB medication? No ?Does the patient have >5 day gap between last estimated fill dates? No ? ? ? ? ?Care Gaps: ?Urine Micro  - Overdue ?Eye Exam- Overdue ?BP- 123/83 (06/28/21) ?AWV- 05/29/21 ?CCM- 8/23 ? ?Star Rating Drugs: ? ?Pravastatin 40 mg - Last filled 04/25/21 90 DS at [facility] ? ? ? ?  Ned Clines CMA ?Clinical Pharmacist Assistant ?[phone] ? ? ?

## 2021-07-16 NOTE — Progress Notes (Signed)
A user error has taken place: encounter opened in error, closed for administrative reasons.

## 2021-07-20 ENCOUNTER — Telehealth: Payer: Self-pay | Admitting: *Deleted

## 2021-07-20 DIAGNOSIS — R5383 Other fatigue: Secondary | ICD-10-CM

## 2021-07-20 DIAGNOSIS — R0681 Apnea, not elsewhere classified: Secondary | ICD-10-CM

## 2021-07-20 DIAGNOSIS — R400 Somnolence: Secondary | ICD-10-CM

## 2021-07-20 NOTE — Telephone Encounter (Signed)
-----   Message from Ledora Bottcher, [place] sent at 07/14/2021  7:47 AM EDT ----- ?Dr. Harl Bowie ?I addended the consult note with add the below items.  ? ?Thank you ?Angie ? ? ? ? ? ?----- Message ----- ?From: Freada Bergeron, CMA ?Sent: 07/13/2021   3:58 PM EDT ?To: Ledora Bottcher, PA ? ?Daytime somnolence, fatigue, witnessed apnea, sleep apnea, insomnia to name a few and this one may pass but just add another one with it to be sure. ?----- Message ----- ?From: Ledora Bottcher, [place] ?Sent: 07/12/2021  12:42 PM EDT ?To: Freada Bergeron, CMA ? ?Does heart block not count? ? ?Just so I know, can you tell me what does get approved? ? ?Thanks ?Angie ? ? ? ? ?----- Message ----- ?From: Freada Bergeron, CMA ?Sent: 07/10/2021   6:28 PM EDT ?To: Ledora Bottcher, PA ? ?Will need order put in and document signs and symptoms. Just FYI- snoring alone will not get approved. Thanks ?----- Message ----- ?From: Ledora Bottcher, [place] ?Sent: 06/28/2021  11:34 AM EDT ?To: Freada Bergeron, CMA, Jennefer Bravo, # ? ?I have ordered a 14 day (not live) zio for "varying degrees of heart block" ? ?Pt will need an appt with Dr. Lovena Le following heart monitor - 4-5 weeks. ? ?Pt will also need a sleep study. ? ?Thanks ?Angie ? ? ? ? ? ? ?

## 2021-07-20 NOTE — Telephone Encounter (Signed)
I have received the items. ?

## 2021-07-23 ENCOUNTER — Encounter: Payer: Self-pay | Admitting: Vascular Surgery

## 2021-07-23 ENCOUNTER — Ambulatory Visit: Payer: Medicare HMO | Admitting: Vascular Surgery

## 2021-07-23 VITALS — BP 128/66 | HR 62 | Temp 98.2°F | Resp 20 | Ht 69.0 in | Wt 236.0 lb

## 2021-07-23 DIAGNOSIS — I7141 Pararenal abdominal aortic aneurysm, without rupture: Secondary | ICD-10-CM

## 2021-07-23 NOTE — Progress Notes (Signed)
Patient not seen by Dr Scot Dock today patient was rescheduled. ?

## 2021-07-24 DIAGNOSIS — R001 Bradycardia, unspecified: Secondary | ICD-10-CM | POA: Diagnosis not present

## 2021-07-24 DIAGNOSIS — I441 Atrioventricular block, second degree: Secondary | ICD-10-CM | POA: Diagnosis not present

## 2021-07-24 NOTE — Telephone Encounter (Addendum)
Prior Authorization for SPLIT NIGHT sent to [facility] via web portal. Procedure Tracking [id number] ?

## 2021-07-27 ENCOUNTER — Other Ambulatory Visit: Payer: Self-pay | Admitting: *Deleted

## 2021-07-27 ENCOUNTER — Telehealth: Payer: Self-pay | Admitting: Cardiovascular Disease

## 2021-07-27 MED ORDER — PRAVASTATIN SODIUM 40 MG PO TABS: 40.0000 mg | ORAL_TABLET | Freq: Every evening | ORAL | 0 refills | Status: DC

## 2021-07-27 NOTE — Telephone Encounter (Signed)
Received a call from [place] with Zio and she reports that the pt wore his Zio from 07/03/21 to 07/17/21 as post hosp orders.... he had 98 episodes of possible CHB total 29 min 44 sec. He had 1 run of VT 17 beats Max HR 148 and avg HR 119.  ? ?Full report has been loaded into Epic and pt has an upcoming appt with Dr. Lovena Le 08/04/21... will froward to him for his review.  ?

## 2021-07-27 NOTE — Telephone Encounter (Addendum)
4/17  APPROVED-TURNER READ ?VALID-  Aug 15 2021 - Sep 14 2021 ?AUTH#- [id number] ? ? ? ? ? ? ? ? ? ? ? ? ? ? ?

## 2021-07-27 NOTE — Telephone Encounter (Signed)
Joel [name] with [facility] calling with abnormal Zio patch results. ?

## 2021-07-28 ENCOUNTER — Other Ambulatory Visit: Payer: Self-pay | Admitting: Physician Assistant

## 2021-07-28 ENCOUNTER — Other Ambulatory Visit: Payer: Self-pay | Admitting: Family Medicine

## 2021-07-28 DIAGNOSIS — G8929 Other chronic pain: Secondary | ICD-10-CM

## 2021-07-28 NOTE — Telephone Encounter (Signed)
Pt is requesting refill on Tramadol. 

## 2021-07-29 ENCOUNTER — Telehealth: Payer: Self-pay | Admitting: Family Medicine

## 2021-07-29 NOTE — Telephone Encounter (Signed)
Pt called and states pharmacy will not refill his Isosorbide '120mg'$  from Dr. Vilma Meckel when he was in hospital.  He states he normally takes 60 mg and he only has 1 tablet left.   ? ?Please advise pt [phone] ? ?Pt coming in Friday for cyst on chest. ?

## 2021-07-30 ENCOUNTER — Other Ambulatory Visit: Payer: Self-pay | Admitting: Physician Assistant

## 2021-07-30 DIAGNOSIS — G8929 Other chronic pain: Secondary | ICD-10-CM

## 2021-07-30 MED ORDER — TRAMADOL HCL 50 MG PO TABS: 50.0000 mg | ORAL_TABLET | Freq: Two times a day (BID) | ORAL | 0 refills | Status: DC | PRN

## 2021-07-30 NOTE — Telephone Encounter (Signed)
I will discuss the correct dosage with him at his office visit on 07/30/2021.

## 2021-07-30 NOTE — Progress Notes (Signed)
? ?Acute Office Visit ? ?Subjective:  ? ? [name], male    DOB: October 05, 1940, 81 y.o.   MRN: [id number] ? ?Chief Complaint  ?Patient presents with  ? Acute Visit  ?  Boil on chest that's been there about 2 years but is now its gotten painful. He also has questions about a medication  ? ? ?HPI ?Patient is in today for has had a boil for 2 years and has now been itchy for 2 - 3 days; states it is not red or painful; denies discharge; denies fever, chills, nausea, vomiting ? ?Outpatient Medications Prior to Visit  ?Medication Sig Dispense Refill  ? abiraterone acetate (ZYTIGA) 250 MG tablet Take 1,000 mg by mouth daily.    ? Accu-Chek FastClix Lancets MISC Test once daily 102 each 12  ? albuterol (VENTOLIN HFA) 108 (90 Base) MCG/ACT inhaler INHALE 2 PUFFS INTO THE LUNGS EVERY 6 HOURS AS NEEDED FOR WHEEZING OR SHORTNESS OF BREATH (Patient taking differently: 2 puffs every 6 (six) hours as needed for shortness of breath.) 54 g 0  ? amLODipine (NORVASC) 10 MG tablet Take 1 tablet (10 mg total) by mouth daily. 90 tablet 2  ? aspirin EC 81 MG tablet Take 81 mg by mouth daily.     ? brimonidine (ALPHAGAN) 0.2 % ophthalmic solution Place 1 drop into both eyes 2 (two) times daily.    ? diclofenac Sodium (VOLTAREN) 1 % GEL Apply 2 g topically 2 (two) times daily as needed (back pain).    ? ferrous sulfate 325 (65 FE) MG tablet Take 325 mg by mouth 2 (two) times daily with a meal.    ? gabapentin (NEURONTIN) 600 MG tablet Take 600 mg by mouth 2 (two) times daily.    ? latanoprost (XALATAN) 0.005 % ophthalmic solution Place 1 drop into both eyes at bedtime.    ? nitroGLYCERIN (NITROSTAT) 0.4 MG SL tablet Place 1 tablet (0.4 mg total) under the tongue every 5 (five) minutes as needed for chest pain (max 3 doses). (Patient taking differently: Place 0.4 mg under the tongue every 5 (five) minutes as needed for chest pain.) 25 tablet 3  ? omeprazole (PRILOSEC) 20 MG capsule Take 20 mg by mouth in the morning and at  bedtime.    ? polycarbophil (FIBERCON) 625 MG tablet Take 625 mg by mouth as needed for mild constipation.    ? pravastatin (PRAVACHOL) 40 MG tablet Take 1 tablet (40 mg total) by mouth every evening. 90 tablet 0  ? predniSONE (DELTASONE) 5 MG tablet Take 5 mg by mouth daily.    ? ranolazine (RANEXA) 500 MG 12 hr tablet Take 1 tablet (500 mg total) by mouth 2 (two) times daily. 60 tablet 0  ? tamsulosin (FLOMAX) 0.4 MG CAPS capsule Take 0.4 mg by mouth at bedtime.    ? traMADol (ULTRAM) 50 MG tablet Take 1 tablet (50 mg total) by mouth every 12 (twelve) hours as needed. 30 tablet 0  ? isosorbide mononitrate (IMDUR) 120 MG 24 hr tablet Take 1 tablet (120 mg total) by mouth daily. 30 tablet 0  ? LORATADINE PO Take 10 mg by mouth at bedtime.    ? metFORMIN (GLUCOPHAGE) 500 MG tablet Take 500 mg by mouth 2 (two) times daily.    ? ?No facility-administered medications prior to visit.  ? ? ?Allergies  ?Allergen Reactions  ? Topiramate Shortness Of Breath and Other (See Comments)  ?   leg cramps  ? Atenolol Other (  See Comments)  ?  bradycardia  ? Lisinopril Cough  ? Tramadol-Acetaminophen Cough  ?  Tolerates plain Tramadol  ? ? ?Review of Systems  ?Constitutional:  Negative for activity change, chills, fatigue and fever.  ?HENT:  Negative for congestion, ear pain, hearing loss and voice change.   ?Eyes:  Negative for pain and redness.  ?Respiratory:  Negative for cough and shortness of breath.   ?Cardiovascular:  Negative for leg swelling.  ?Gastrointestinal:  Negative for constipation, diarrhea, nausea and vomiting.  ?Endocrine: Negative for polyuria.  ?Genitourinary:  Negative for flank pain and frequency.  ?Musculoskeletal:  Negative for joint swelling and neck pain.  ?Skin:  Negative for color change, pallor, rash and wound.  ?Neurological:  Negative for dizziness.  ?Hematological:  Does not bruise/bleed easily.  ?Psychiatric/Behavioral:  Negative for agitation and behavioral problems.   ? ?   ?Objective:  ?  ?Physical  Exam ?Vitals and nursing note reviewed.  ?Constitutional:   ?   General: He is not in acute distress. ?   Appearance: Normal appearance.  ?HENT:  ?   Head: Normocephalic and atraumatic.  ?   Right Ear: External ear normal.  ?   Left Ear: External ear normal.  ?   Nose: No congestion.  ?Eyes:  ?   Extraocular Movements: Extraocular movements intact.  ?   Conjunctiva/sclera: Conjunctivae normal.  ?   Pupils: Pupils are equal, round, and reactive to light.  ?Cardiovascular:  ?   Rate and Rhythm: Normal rate and regular rhythm.  ?   Pulses: Normal pulses.  ?   Heart sounds: Normal heart sounds.  ?Pulmonary:  ?   Effort: Pulmonary effort is normal.  ?   Breath sounds: Normal breath sounds. No wheezing.  ?Abdominal:  ?   General: Bowel sounds are normal.  ?   Palpations: Abdomen is soft.  ?Musculoskeletal:     ?   General: Normal range of motion.  ?   Cervical back: Normal range of motion and neck supple.  ?   Right lower leg: No edema.  ?   Left lower leg: No edema.  ?Skin: ?   General: Skin is warm and dry.  ?   Findings: Lesion present. No rash.  ? ?    ?   Comments: Nickel sized, flesh-colored raised lesion with black center; no erythema, no edema, no warmth, no tenderness to palpation  ?Neurological:  ?   Mental Status: He is alert and oriented to person, place, and time.  ?   Gait: Gait normal.  ?Psychiatric:     ?   Mood and Affect: Mood normal.     ?   Behavior: Behavior normal.  ? ? ?BP 120/60   Pulse 65   Wt 238 lb 6.4 oz (108.1 kg)   SpO2 96%   BMI 35.21 kg/m?  ? ?Wt Readings from Last 3 Encounters:  ?07/31/21 238 lb 6.4 oz (108.1 kg)  ?07/23/21 236 lb (107 kg)  ?06/27/21 237 lb 14.4 oz (107.9 kg)  ? ?  ?Results for orders placed or performed during the hospital encounter of 06/26/21  ?CBC  ?Result Value Ref Range  ? WBC 5.4 4.0 - 10.5 K/uL  ? RBC 4.60 4.22 - 5.81 MIL/uL  ? Hemoglobin 10.3 (L) 13.0 - 17.0 g/dL  ? HCT 33.3 (L) 39.0 - 52.0 %  ? MCV 72.4 (L) 80.0 - 100.0 fL  ? MCH 22.4 (L) 26.0 - 34.0 pg  ?  MCHC 30.9 30.0 -  36.0 g/dL  ? RDW 16.4 (H) 11.5 - 15.5 %  ? Platelets 183 150 - 400 K/uL  ? nRBC 0.0 0.0 - 0.2 %  ?Comprehensive metabolic panel  ?Result Value Ref Range  ? Sodium 140 135 - 145 mmol/L  ? Potassium 3.4 (L) 3.5 - 5.1 mmol/L  ? Chloride 104 98 - 111 mmol/L  ? CO2 27 22 - 32 mmol/L  ? Glucose, Bld 84 70 - 99 mg/dL  ? BUN 8 8 - 23 mg/dL  ? Creatinine, Ser 1.02 0.61 - 1.24 mg/dL  ? Calcium 8.9 8.9 - 10.3 mg/dL  ? Total Protein 6.5 6.5 - 8.1 g/dL  ? Albumin 3.5 3.5 - 5.0 g/dL  ? AST 15 15 - 41 U/L  ? ALT 11 0 - 44 U/L  ? Alkaline Phosphatase 82 38 - 126 U/L  ? Total Bilirubin 0.2 (L) 0.3 - 1.2 mg/dL  ? GFR, Estimated >60 >60 mL/min  ? Anion gap 9 5 - 15  ?Magnesium  ?Result Value Ref Range  ? Magnesium 1.9 1.7 - 2.4 mg/dL  ?Phosphorus  ?Result Value Ref Range  ? Phosphorus 3.0 2.5 - 4.6 mg/dL  ?Brain natriuretic peptide  ?Result Value Ref Range  ? B Natriuretic Peptide 64.3 0.0 - 100.0 pg/mL  ?Basic metabolic panel  ?Result Value Ref Range  ? Sodium 139 135 - 145 mmol/L  ? Potassium 3.3 (L) 3.5 - 5.1 mmol/L  ? Chloride 103 98 - 111 mmol/L  ? CO2 29 22 - 32 mmol/L  ? Glucose, Bld 117 (H) 70 - 99 mg/dL  ? BUN 8 8 - 23 mg/dL  ? Creatinine, Ser 1.07 0.61 - 1.24 mg/dL  ? Calcium 9.0 8.9 - 10.3 mg/dL  ? GFR, Estimated >60 >60 mL/min  ? Anion gap 7 5 - 15  ?Troponin I (High Sensitivity)  ?Result Value Ref Range  ? Troponin I (High Sensitivity) 11 <18 ng/L  ?Troponin I (High Sensitivity)  ?Result Value Ref Range  ? Troponin I (High Sensitivity) 13 <18 ng/L  ? ? ?   ?Assessment & Plan:  ?1. Skin lesion of chest wall ?- Ambulatory referral to Dermatology ?- skin lesion in the middle of chest; will refer to Dermatology for further evaluation ? ?2. Essential hypertension ?- low salt diet ? ? ?Meds ordered this encounter  ?Medications  ? isosorbide mononitrate (IMDUR) 120 MG 24 hr tablet  ?  Sig: Take 1 tablet (120 mg total) by mouth daily.  ?  Dispense:  90 tablet  ?  Refill:  1  ?  Order Specific Question:    Supervising Provider  ?  Answer:   Denita Lung [[id number]]  ? ? ?Return for Return as Already Scheduled. ? ?Irene Pap, PA-C ?

## 2021-07-31 ENCOUNTER — Ambulatory Visit (INDEPENDENT_AMBULATORY_CARE_PROVIDER_SITE_OTHER): Payer: Medicare HMO | Admitting: Physician Assistant

## 2021-07-31 ENCOUNTER — Encounter: Payer: Self-pay | Admitting: Physician Assistant

## 2021-07-31 VITALS — BP 120/60 | HR 65 | Wt 238.4 lb

## 2021-07-31 DIAGNOSIS — I10 Essential (primary) hypertension: Secondary | ICD-10-CM | POA: Diagnosis not present

## 2021-07-31 DIAGNOSIS — L989 Disorder of the skin and subcutaneous tissue, unspecified: Secondary | ICD-10-CM | POA: Diagnosis not present

## 2021-07-31 MED ORDER — ISOSORBIDE MONONITRATE ER 120 MG PO TB24: 120.0000 mg | ORAL_TABLET | Freq: Every day | ORAL | 1 refills | Status: DC

## 2021-07-31 NOTE — Patient Instructions (Signed)
BP Readings from Last 10 Encounters:  ?07/31/21 120/60  ?07/23/21 128/66  ?06/28/21 123/83  ?06/26/21 (!) 173/73  ?06/15/21 (!) 110/50  ?04/14/21 134/70  ?03/27/21 110/60  ?03/26/21 131/67  ?03/23/21 (!) 153/98  ?01/16/21 122/62  ? ? ?

## 2021-08-01 ENCOUNTER — Other Ambulatory Visit: Payer: Self-pay

## 2021-08-01 ENCOUNTER — Emergency Department (HOSPITAL_COMMUNITY)
Admission: EM | Admit: 2021-08-01 | Discharge: 2021-08-01 | Disposition: A | Discharge status: Home / Self Care | Payer: Medicare HMO | Attending: Emergency Medicine | Admitting: Emergency Medicine

## 2021-08-01 ENCOUNTER — Encounter (HOSPITAL_COMMUNITY): Payer: Self-pay | Admitting: Emergency Medicine

## 2021-08-01 ENCOUNTER — Emergency Department (HOSPITAL_COMMUNITY): Payer: Medicare HMO

## 2021-08-01 DIAGNOSIS — E119 Type 2 diabetes mellitus without complications: Secondary | ICD-10-CM | POA: Insufficient documentation

## 2021-08-01 DIAGNOSIS — R079 Chest pain, unspecified: Secondary | ICD-10-CM | POA: Insufficient documentation

## 2021-08-01 DIAGNOSIS — Z7984 Long term (current) use of oral hypoglycemic drugs: Secondary | ICD-10-CM | POA: Diagnosis not present

## 2021-08-01 DIAGNOSIS — Z79899 Other long term (current) drug therapy: Secondary | ICD-10-CM | POA: Diagnosis not present

## 2021-08-01 DIAGNOSIS — J449 Chronic obstructive pulmonary disease, unspecified: Secondary | ICD-10-CM | POA: Diagnosis not present

## 2021-08-01 DIAGNOSIS — R0602 Shortness of breath: Secondary | ICD-10-CM | POA: Diagnosis not present

## 2021-08-01 DIAGNOSIS — Z7982 Long term (current) use of aspirin: Secondary | ICD-10-CM | POA: Diagnosis not present

## 2021-08-01 DIAGNOSIS — I2510 Atherosclerotic heart disease of native coronary artery without angina pectoris: Secondary | ICD-10-CM | POA: Insufficient documentation

## 2021-08-01 DIAGNOSIS — R0789 Other chest pain: Secondary | ICD-10-CM | POA: Diagnosis not present

## 2021-08-01 LAB — CBC
HCT: 34.4 % — ABNORMAL LOW (ref 39.0–52.0)
Hemoglobin: 10.3 g/dL — ABNORMAL LOW (ref 13.0–17.0)
MCH: 21.6 pg — ABNORMAL LOW (ref 26.0–34.0)
MCHC: 29.9 g/dL — ABNORMAL LOW (ref 30.0–36.0)
MCV: 72.1 fL — ABNORMAL LOW (ref 80.0–100.0)
Platelets: 175 10*3/uL (ref 150–400)
RBC: 4.77 MIL/uL (ref 4.22–5.81)
RDW: 16.4 % — ABNORMAL HIGH (ref 11.5–15.5)
WBC: 5.3 10*3/uL (ref 4.0–10.5)
nRBC: 0 % (ref 0.0–0.2)

## 2021-08-01 LAB — BASIC METABOLIC PANEL
Anion gap: 10 (ref 5–15)
BUN: 8 mg/dL (ref 8–23)
CO2: 23 mmol/L (ref 22–32)
Calcium: 9 mg/dL (ref 8.9–10.3)
Chloride: 104 mmol/L (ref 98–111)
Creatinine, Ser: 1.2 mg/dL (ref 0.61–1.24)
GFR, Estimated: >60 mL/min (ref >60)
Glucose, Bld: 142 mg/dL — ABNORMAL HIGH (ref 70–99)
Potassium: 3.3 mmol/L — ABNORMAL LOW (ref 3.5–5.1)
Sodium: 137 mmol/L (ref 135–145)

## 2021-08-01 LAB — TROPONIN I (HIGH SENSITIVITY)
Troponin I (High Sensitivity): 11 ng/L (ref <18)
Troponin I (High Sensitivity): 13 ng/L (ref <18)

## 2021-08-01 MED ORDER — POTASSIUM CHLORIDE CRYS ER 20 MEQ PO TBCR
40.0000 meq | EXTENDED_RELEASE_TABLET | Freq: Once | ORAL | Status: AC
  Administered 2021-08-01: 40 meq via ORAL
  Filled 2021-08-01: qty 2

## 2021-08-01 MED ORDER — ASPIRIN 81 MG PO CHEW
324.0000 mg | CHEWABLE_TABLET | Freq: Once | ORAL | Status: AC
Start: 2021-08-01 — End: 2021-08-01
  Administered 2021-08-01: 324 mg via ORAL
  Filled 2021-08-01: qty 4

## 2021-08-01 NOTE — ED Notes (Signed)
Patient denies pain and is resting comfortably.  

## 2021-08-01 NOTE — Discharge Instructions (Addendum)
Please continue to monitor your symptoms closely and return to the emergency department if you develop any new or worsening symptoms whatsoever.  Please follow-up with your cardiologist at your scheduled appointment in 3 days. ?

## 2021-08-01 NOTE — ED Triage Notes (Signed)
C/o L sided chest pain since 12:30pm with mild SOB.  Denies nausea and vomiting. ?

## 2021-08-01 NOTE — ED Notes (Signed)
Patient transported to X-ray 

## 2021-08-01 NOTE — ED Provider Notes (Signed)
?[facility] ?Provider Note ? ? ?CSN: [id number] ?Arrival date & time: 08/01/21  1509 ? ?  ? ?History ? ?Chief Complaint  ?Patient presents with  ? Chest Pain  ? ? ?Joel [name] is a 81 y.o. male. ? ?HPI ?Patient is an 81 year old male with a history of CAD, COPD, TIA, PVD, diabetes mellitus, who presents to the emergency department due to left-sided dull chest pain that started around about 12:30 PM.  He states that he was riding an electric wheelchair in the grocery store when his pain started.  States that has been gradually alleviating since onset but is still currently mildly present.  States that he took 2 doses of NTG with mild relief as well.  Reports intermittent shortness of breath.  No nausea, vomiting, diaphoresis, abdominal pain. ? ?Per records, patient wore a Zio patch from July 03, 2021 to July 17, 2021 with findings as noted below: ? ?1. NSR with sinus brady and sinus tachycardia ?2. Nocturnal/Early morning transient CHB as well as high 2:1 AV block with pauses of up to 3.5 seconds ?3. Occasional PVC's ?4.  Brief NSVT as noted below. ?5. No atrial fib ? ?Per records, patient had a left heart cath and coronary angiography on August 01, 2019 with findings as noted below: ? ?        Prox RCA lesion is 40% stenosed. ?Mid RCA lesion is 40% stenosed. ?Mid RCA to Dist RCA lesion is 50% stenosed. ?RPDA lesion is 70% stenosed. ?3rd RPL lesion is 70% stenosed. ?Prox Cx to Dist Cx lesion is 40% stenosed. ?Prox LAD to Mid LAD lesion is 40% stenosed. ?Mid LAD lesion is 40% stenosed. ?Dist LAD lesion is 70% stenosed. ?The left ventricular systolic function is normal. ?LV end diastolic pressure is normal. ?The left ventricular ejection fraction is 55-65% by visual estimate. ?There is no mitral valve regurgitation. ?  ? ?Home Medications ?Prior to Admission medications   ?Medication Sig Start Date End Date Taking? Authorizing Provider  ?abiraterone acetate (ZYTIGA) 250 MG  tablet Take 1,000 mg by mouth daily.    [provider]  ?Accu-Chek FastClix Lancets MISC Test once daily 11/23/18   Henson, Vickie L, NP-C  ?albuterol (VENTOLIN HFA) 108 (90 Base) MCG/ACT inhaler INHALE 2 PUFFS INTO THE LUNGS EVERY 6 HOURS AS NEEDED FOR WHEEZING OR SHORTNESS OF BREATH ?Patient taking differently: 2 puffs every 6 (six) hours as needed for shortness of breath. 03/15/19   Henson, Vickie L, NP-C  ?amLODipine (NORVASC) 10 MG tablet Take 1 tablet (10 mg total) by mouth daily. 12/16/20   Burnell Blanks, MD  ?aspirin EC 81 MG tablet Take 81 mg by mouth daily.     [provider]  ?brimonidine (ALPHAGAN) 0.2 % ophthalmic solution Place 1 drop into both eyes 2 (two) times daily. 05/09/20   [provider]  ?diclofenac Sodium (VOLTAREN) 1 % GEL Apply 2 g topically 2 (two) times daily as needed (back pain). 06/16/20   [provider]  ?ferrous sulfate 325 (65 FE) MG tablet Take 325 mg by mouth 2 (two) times daily with a meal.    [provider]  ?gabapentin (NEURONTIN) 600 MG tablet Take 600 mg by mouth 2 (two) times daily.    [provider]  ?isosorbide mononitrate (IMDUR) 120 MG 24 hr tablet Take 1 tablet (120 mg total) by mouth daily. 07/31/21   Irene Pap, PA-C  ?latanoprost (XALATAN) 0.005 % ophthalmic solution Place 1 drop into  both eyes at bedtime. 02/28/20   [provider]  ?LORATADINE PO Take 10 mg by mouth at bedtime.    [provider]  ?metFORMIN (GLUCOPHAGE) 500 MG tablet Take 500 mg by mouth 2 (two) times daily. 01/26/21   [provider]  ?nitroGLYCERIN (NITROSTAT) 0.4 MG SL tablet Place 1 tablet (0.4 mg total) under the tongue every 5 (five) minutes as needed for chest pain (max 3 doses). ?Patient taking differently: Place 0.4 mg under the tongue every 5 (five) minutes as needed for chest pain. 10/20/20   Burnell Blanks, MD  ?omeprazole (PRILOSEC) 20 MG capsule Take 20 mg by mouth in the morning  and at bedtime.    [provider]  ?polycarbophil (FIBERCON) 625 MG tablet Take 625 mg by mouth as needed for mild constipation.    [provider]  ?pravastatin (PRAVACHOL) 40 MG tablet Take 1 tablet (40 mg total) by mouth every evening. 07/27/21   Burnell Blanks, MD  ?predniSONE (DELTASONE) 5 MG tablet Take 5 mg by mouth daily. 07/17/20   [provider]  ?ranolazine (RANEXA) 500 MG 12 hr tablet Take 1 tablet (500 mg total) by mouth 2 (two) times daily. 06/28/21   Patrecia Pour, MD  ?tamsulosin (FLOMAX) 0.4 MG CAPS capsule Take 0.4 mg by mouth at bedtime.    [provider]  ?traMADol (ULTRAM) 50 MG tablet Take 1 tablet (50 mg total) by mouth every 12 (twelve) hours as needed. 07/30/21   Irene Pap, PA-C  ?   ? ?Allergies    ?Topiramate, Atenolol, Lisinopril, and Tramadol-acetaminophen   ? ?Review of Systems   ?Review of Systems  ?All other systems reviewed and are negative. ?Ten systems reviewed and are negative for acute change, except as noted in the HPI.   ?Physical Exam ?Updated Vital Signs ?BP (!) 160/77   Pulse (!) 34   Temp 98.4 ?F (36.9 ?C) (Oral)   Resp 20   SpO2 97%  ?Physical Exam ?Vitals and nursing note reviewed.  ?Constitutional:   ?   General: He is not in acute distress. ?   Appearance: Normal appearance. He is not ill-appearing, toxic-appearing or diaphoretic.  ?HENT:  ?   Head: Normocephalic and atraumatic.  ?   Right Ear: External ear normal.  ?   Left Ear: External ear normal.  ?   Nose: Nose normal.  ?   Mouth/Throat:  ?   Mouth: Mucous membranes are moist.  ?   Pharynx: Oropharynx is clear. No oropharyngeal exudate or posterior oropharyngeal erythema.  ?Eyes:  ?   Extraocular Movements: Extraocular movements intact.  ?Cardiovascular:  ?   Rate and Rhythm: Normal rate and regular rhythm.  ?   Pulses: Normal pulses.     ?     Radial pulses are 2+ on the right side and 2+ on the left side.  ?     Dorsalis pedis pulses are 2+ on the right side  and 2+ on the left side.  ?   Heart sounds: Normal heart sounds. Heart sounds not distant. No murmur heard. ?No systolic murmur is present.  ?No diastolic murmur is present.  ?  No friction rub. No gallop. No S3 or S4 sounds.  ?Pulmonary:  ?   Effort: Pulmonary effort is normal. No tachypnea, accessory muscle usage or respiratory distress.  ?   Breath sounds: Normal breath sounds. No stridor. No decreased breath sounds, wheezing, rhonchi or rales.  ?Chest:  ?  Chest wall: No tenderness.  ?   Comments: No anterior chest wall tenderness. ?Abdominal:  ?   General: Abdomen is flat.  ?   Palpations: Abdomen is soft.  ?   Tenderness: There is no abdominal tenderness.  ?   Comments: Abdomen is soft and nontender  ?Musculoskeletal:     ?   General: Normal range of motion.  ?   Cervical back: Normal range of motion and neck supple. No tenderness.  ?   Right lower leg: No tenderness. No edema.  ?   Left lower leg: No tenderness. No edema.  ?   Comments: No pedal edema.  No calf tenderness.  ?Skin: ?   General: Skin is warm and dry.  ?Neurological:  ?   General: No focal deficit present.  ?   Mental Status: He is alert and oriented to person, place, and time.  ?Psychiatric:     ?   Mood and Affect: Mood normal.     ?   Behavior: Behavior normal.  ? ?ED Results / Procedures / Treatments   ?Labs ?(all labs ordered are listed, but only abnormal results are displayed) ?Labs Reviewed  ?BASIC METABOLIC PANEL - Abnormal; Notable for the following components:  ?    Result Value  ? Potassium 3.3 (*)   ? Glucose, Bld 142 (*)   ? All other components within normal limits  ?CBC - Abnormal; Notable for the following components:  ? Hemoglobin 10.3 (*)   ? HCT 34.4 (*)   ? MCV 72.1 (*)   ? MCH 21.6 (*)   ? MCHC 29.9 (*)   ? RDW 16.4 (*)   ? All other components within normal limits  ?TROPONIN I (HIGH SENSITIVITY)  ?TROPONIN I (HIGH SENSITIVITY)  ? ?EKG ?EKG Interpretation ? ?Date/Time:  Saturday August 01 2021 15:16:39 EDT ?Ventricular Rate:   63 ?PR Interval:  292 ?QRS Duration: 90 ?QT Interval:  448 ?QTC Calculation: 458 ?R Axis:   34 ?Text Interpretation: Sinus rhythm with 1st degree A-V block with Premature atrial complexes with [employer] con

## 2021-08-02 ENCOUNTER — Encounter: Payer: Self-pay | Admitting: Physician Assistant

## 2021-08-02 NOTE — Assessment & Plan Note (Signed)
controlled, eat a low salt diet, do not add any salt to food when cooking, avoid processed foods, avoid fried foods ? ?

## 2021-08-03 ENCOUNTER — Telehealth: Payer: Self-pay

## 2021-08-03 NOTE — Telephone Encounter (Signed)
I called the pt. Per my pt. Ping report, he was recently in the ED for chest pain. He said he did not need to f/u here he was feeling better now. He also said all the test and exams they did in the ED came back normal.  ?

## 2021-08-04 ENCOUNTER — Encounter: Payer: Self-pay | Admitting: Internal Medicine

## 2021-08-04 ENCOUNTER — Ambulatory Visit: Payer: Medicare HMO | Admitting: Internal Medicine

## 2021-08-04 VITALS — BP 128/62 | HR 52 | Ht 69.0 in | Wt 239.0 lb

## 2021-08-04 DIAGNOSIS — I10 Essential (primary) hypertension: Secondary | ICD-10-CM

## 2021-08-04 DIAGNOSIS — I459 Conduction disorder, unspecified: Secondary | ICD-10-CM | POA: Insufficient documentation

## 2021-08-04 NOTE — Progress Notes (Signed)
? ? ? ? ?HPI ?Joel [name] is referred by Dr. Angelena Form for evaluation of transient CHB. He is a pleasant 81 yo man with a h/o HTN, PAD, CAD, and has undergone cardiac monitoring which has demonstrated transient daytime heart block with 4 second pauses. He has not had syncope though gets a little lightheaded. His spells occur in the morning but after he wakes up, between 7-9 a.m. In addition he has non-sustained VT. He is on no AV or sinus nodal blocking drugs.  ?Allergies  ?Allergen Reactions  ? Topiramate Shortness Of Breath and Other (See Comments)  ?   leg cramps  ? Atenolol Other (See Comments)  ?  bradycardia  ? Lisinopril Cough  ? Tramadol-Acetaminophen Cough  ?  Tolerates plain Tramadol  ? ? ? ?Current Outpatient Medications  ?Medication Sig Dispense Refill  ? abiraterone acetate (ZYTIGA) 250 MG tablet Take 1,000 mg by mouth daily.    ? Accu-Chek FastClix Lancets MISC Test once daily 102 each 12  ? albuterol (VENTOLIN HFA) 108 (90 Base) MCG/ACT inhaler INHALE 2 PUFFS INTO THE LUNGS EVERY 6 HOURS AS NEEDED FOR WHEEZING OR SHORTNESS OF BREATH (Patient taking differently: 2 puffs every 6 (six) hours as needed for shortness of breath.) 54 g 0  ? amLODipine (NORVASC) 10 MG tablet Take 1 tablet (10 mg total) by mouth daily. 90 tablet 2  ? aspirin EC 81 MG tablet Take 81 mg by mouth daily.     ? brimonidine (ALPHAGAN) 0.2 % ophthalmic solution Place 1 drop into both eyes 2 (two) times daily.    ? diclofenac Sodium (VOLTAREN) 1 % GEL Apply 2 g topically 2 (two) times daily as needed (back pain).    ? ferrous sulfate 325 (65 FE) MG tablet Take 325 mg by mouth 2 (two) times daily with a meal.    ? gabapentin (NEURONTIN) 600 MG tablet Take 600 mg by mouth 2 (two) times daily.    ? isosorbide mononitrate (IMDUR) 120 MG 24 hr tablet Take 1 tablet (120 mg total) by mouth daily. 90 tablet 1  ? latanoprost (XALATAN) 0.005 % ophthalmic solution Place 1 drop into both eyes at bedtime.    ? LORATADINE PO Take 10 mg by mouth  at bedtime.    ? metFORMIN (GLUCOPHAGE) 500 MG tablet Take 500 mg by mouth 2 (two) times daily.    ? nitroGLYCERIN (NITROSTAT) 0.4 MG SL tablet Place 1 tablet (0.4 mg total) under the tongue every 5 (five) minutes as needed for chest pain (max 3 doses). (Patient taking differently: Place 0.4 mg under the tongue every 5 (five) minutes as needed for chest pain.) 25 tablet 3  ? omeprazole (PRILOSEC) 20 MG capsule Take 20 mg by mouth in the morning and at bedtime.    ? polycarbophil (FIBERCON) 625 MG tablet Take 625 mg by mouth as needed for mild constipation.    ? pravastatin (PRAVACHOL) 40 MG tablet Take 1 tablet (40 mg total) by mouth every evening. 90 tablet 0  ? predniSONE (DELTASONE) 5 MG tablet Take 5 mg by mouth daily.    ? ranolazine (RANEXA) 500 MG 12 hr tablet Take 1 tablet (500 mg total) by mouth 2 (two) times daily. 60 tablet 0  ? tamsulosin (FLOMAX) 0.4 MG CAPS capsule Take 0.4 mg by mouth at bedtime.    ? traMADol (ULTRAM) 50 MG tablet Take 1 tablet (50 mg total) by mouth every 12 (twelve) hours as needed. 30 tablet 0  ? ?No current facility-administered  medications for this visit.  ? ? ? ?Past Medical History:  ?Diagnosis Date  ? Atypical chest pain   ? CAD (coronary artery disease)   ? a. Multiple prior evaluations then 05/2012 s/p BMS to OM2. b. Nuc 11/2012 wnl. c. Cath 2015: patent stent, moderate LAD/RCA dz, treated medically.  ? Cataract   ? CHEST PAIN UNSPECIFIED 09/13/2008  ? Qualifier: Diagnosis of  By: Rose Fillers, RN, Heather    ? CKD (chronic kidney disease), stage II   ? COPD (chronic obstructive pulmonary disease) ([place])   ? Cough secondary to angiotensin converting enzyme inhibitor (ACE-I)   ? Diabetes mellitus ([place]) 09/07/2019  ? Diabetes mellitus without complication ([place])   ? Diverticulosis   ? DVT (deep venous thrombosis) ([place])   ? Family history of breast cancer in first degree relative   ? GERD (gastroesophageal reflux disease)   ? Headache(784.0)   ? Hx: of  ? History of blood transfusion   ?  "I've had 2; don't know what it was related to" (11/09/2012)  ? HLD (hyperlipidemia)   ? HTN (hypertension)   ? Iron deficiency anemia   ? Lymphoproliferative disorder, low grade B cell ([place]) 05/21/2020  ? Microcytic anemia   ? Peripheral vascular disease ([place])   ? a. L pop-tibial bypass 2011, followed by [facility].  ? Personal history of prostate cancer   ? Rotator cuff injury   ? "left arm; never repaired" (11/09/2012)  ? ? ?ROS: ? ? All systems reviewed and negative except as noted in the HPI. ? ? ?Past Surgical History:  ?Procedure Laterality Date  ? CARDIOVASCULAR STRESS TEST  Aug. 2014  ? CATARACT EXTRACTION W/ INTRAOCULAR LENS IMPLANT Right ~ 2008  ? CIRCUMCISION    ? COLONOSCOPY    ? CORONARY ANGIOPLASTY WITH STENT PLACEMENT  2014  ? "1" (11/09/2012)  ? ILIAC ARTERY ANEURYSM REPAIR    ? LEFT HEART CATH  05-31-12  ? LEFT HEART CATH AND CORONARY ANGIOGRAPHY N/A 08/01/2019  ? Procedure: LEFT HEART CATH AND CORONARY ANGIOGRAPHY;  Surgeon: Burnell Blanks, MD;  Location: [place] [facility];  Service: Cardiovascular;  Laterality: N/A;  ? LEFT HEART CATHETERIZATION WITH CORONARY ANGIOGRAM N/A 06/01/2011  ? Procedure: LEFT HEART CATHETERIZATION WITH CORONARY ANGIOGRAM;  Surgeon: Hillary Bow, MD;  Location: [facility] CATH LAB;  Service: Cardiovascular;  Laterality: N/A;  ? LEFT HEART CATHETERIZATION WITH CORONARY ANGIOGRAM N/A 05/31/2012  ? Procedure: LEFT HEART CATHETERIZATION WITH CORONARY ANGIOGRAM;  Surgeon: Peter M Martinique, MD;  Location: [facility] CATH LAB;  Service: Cardiovascular;  Laterality: N/A;  ? LEFT HEART CATHETERIZATION WITH CORONARY ANGIOGRAM N/A 08/01/2013  ? Procedure: LEFT HEART CATHETERIZATION WITH CORONARY ANGIOGRAM;  Surgeon: Burnell Blanks, MD;  Location: [facility] CATH LAB;  Service: Cardiovascular;  Laterality: N/A;  ? PERCUTANEOUS CORONARY STENT INTERVENTION (PCI-S)  05/31/2012  ? Procedure: PERCUTANEOUS CORONARY STENT INTERVENTION (PCI-S);  Surgeon: Peter M Martinique, MD;  Location: [facility] CATH LAB;  Service:  Cardiovascular;;  ? PR VEIN BYPASS GRAFT,AORTO-FEM-POP Left 10/01/2009  ? left below knee popliteal artery to posterior tibial artery  ? SHOULDER ARTHROSCOPY WITH OPEN ROTATOR CUFF REPAIR AND DISTAL CLAVICLE ACROMINECTOMY Left 01/12/2013  ? Procedure: LEFT SHOULDER ARTHROSCOPY WITH DEBRIDEMENT OPEN DISTAL CLAVICLE RESECTION ,acromioplastyAND ROTATOR CUFF REPAIR;  Surgeon: Yvette Rack., MD;  Location: [place];  Service: Orthopedics;  Laterality: Left;  ? ? ? ?Family History  ?Problem Relation Age of Onset  ? Breast cancer Mother   ?     dx 46s  ?  Hyperlipidemia Mother   ? Hypertension Mother   ? COPD Father   ?     deceased  ? Hyperlipidemia Father   ? Hypertension Father   ? Hypertension Sister   ? Cancer Brother 39  ?     Unknown cancer, possibly stomach  ? Hypertension Brother   ? Cancer Brother 41  ?     Unknown, possibly lung  ? Hypertension Daughter   ? Hypertension Son   ? Colon cancer Neg Hx   ?     pt is unclear about family hx  ? ? ? ?Social History  ? ?Socioeconomic History  ? Marital status: Widowed  ?  Spouse name: Not on file  ? Number of children: Not on file  ? Years of education: Not on file  ? Highest education level: 12th grade  ?Occupational History  ? Occupation: Retired  ?Tobacco Use  ? Smoking status: Former  ?  Packs/day: 0.50  ?  Years: 52.00  ?  Pack years: 26.00  ?  Types: Cigarettes  ?  Quit date: 05/26/2011  ?  Years since quitting: 10.2  ?  Passive exposure: Past  ? Smokeless tobacco: Never  ?Vaping Use  ? Vaping Use: Never used  ?Substance and Sexual Activity  ? Alcohol use: No  ? Drug use: No  ? Sexual activity: Not on file  ?Other Topics Concern  ? Not on file  ?Social History Narrative  ? Single. Retired. 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  Lives in [place] by himself.  ?   ? Patient writes with his right hand, though uses his left hand for every thing else. He lives alone in a one level home. He drinks 3 cups of coffee a day and 3 7-8 oz sodas a day. He does not exercise.  ? ?Social  Determinants of Health  ? ?Financial Resource Strain: Low Risk   ? Difficulty of Paying Living Expenses: Not hard at all  ?Food Insecurity: No Food Insecurity  ? Worried About Charity fundraiser in the Last Year: Ne

## 2021-08-04 NOTE — H&P (View-Only) (Signed)
HPI [name] is referred by Dr. Angelena Form for evaluation of transient CHB. He is a pleasant 81 yo man with a h/o HTN, PAD, CAD, and has undergone cardiac monitoring which has demonstrated transient daytime heart block with 4 second pauses. He has not had syncope though gets a little lightheaded. His spells occur in the morning but after he wakes up, between 7-9 a.m. In addition he has non-sustained VT. He is on no AV or sinus nodal blocking drugs.  Allergies  Allergen Reactions   Topiramate Shortness Of Breath and Other (See Comments)     leg cramps   Atenolol Other (See Comments)    bradycardia   Lisinopril Cough   Tramadol-Acetaminophen Cough    Tolerates plain Tramadol     Current Outpatient Medications  Medication Sig Dispense Refill   abiraterone acetate (ZYTIGA) 250 MG tablet Take 1,000 mg by mouth daily.     Accu-Chek FastClix Lancets MISC Test once daily 102 each 12   albuterol (VENTOLIN HFA) 108 (90 Base) MCG/ACT inhaler INHALE 2 PUFFS INTO THE LUNGS EVERY 6 HOURS AS NEEDED FOR WHEEZING OR SHORTNESS OF BREATH (Patient taking differently: 2 puffs every 6 (six) hours as needed for shortness of breath.) 54 g 0   amLODipine (NORVASC) 10 MG tablet Take 1 tablet (10 mg total) by mouth daily. 90 tablet 2   aspirin EC 81 MG tablet Take 81 mg by mouth daily.      brimonidine (ALPHAGAN) 0.2 % ophthalmic solution Place 1 drop into both eyes 2 (two) times daily.     diclofenac Sodium (VOLTAREN) 1 % GEL Apply 2 g topically 2 (two) times daily as needed (back pain).     ferrous sulfate 325 (65 FE) MG tablet Take 325 mg by mouth 2 (two) times daily with a meal.     gabapentin (NEURONTIN) 600 MG tablet Take 600 mg by mouth 2 (two) times daily.     isosorbide mononitrate (IMDUR) 120 MG 24 hr tablet Take 1 tablet (120 mg total) by mouth daily. 90 tablet 1   latanoprost (XALATAN) 0.005 % ophthalmic solution Place 1 drop into both eyes at bedtime.     LORATADINE PO Take 10 mg by mouth  at bedtime.     metFORMIN (GLUCOPHAGE) 500 MG tablet Take 500 mg by mouth 2 (two) times daily.     nitroGLYCERIN (NITROSTAT) 0.4 MG SL tablet Place 1 tablet (0.4 mg total) under the tongue every 5 (five) minutes as needed for chest pain (max 3 doses). (Patient taking differently: Place 0.4 mg under the tongue every 5 (five) minutes as needed for chest pain.) 25 tablet 3   omeprazole (PRILOSEC) 20 MG capsule Take 20 mg by mouth in the morning and at bedtime.     polycarbophil (FIBERCON) 625 MG tablet Take 625 mg by mouth as needed for mild constipation.     pravastatin (PRAVACHOL) 40 MG tablet Take 1 tablet (40 mg total) by mouth every evening. 90 tablet 0   predniSONE (DELTASONE) 5 MG tablet Take 5 mg by mouth daily.     ranolazine (RANEXA) 500 MG 12 hr tablet Take 1 tablet (500 mg total) by mouth 2 (two) times daily. 60 tablet 0   tamsulosin (FLOMAX) 0.4 MG CAPS capsule Take 0.4 mg by mouth at bedtime.     traMADol (ULTRAM) 50 MG tablet Take 1 tablet (50 mg total) by mouth every 12 (twelve) hours as needed. 30 tablet 0   No current facility-administered  medications for this visit.     Past Medical History:  Diagnosis Date   Atypical chest pain    CAD (coronary artery disease)    a. Multiple prior evaluations then 05/2012 s/p BMS to OM2. b. Nuc 11/2012 wnl. c. Cath 2015: patent stent, moderate LAD/RCA dz, treated medically.   Cataract    CHEST PAIN UNSPECIFIED 09/13/2008   Qualifier: Diagnosis of  By: Rose Fillers, RN, Heather     CKD (chronic kidney disease), stage II    COPD (chronic obstructive pulmonary disease) ([facility])    Cough secondary to angiotensin converting enzyme inhibitor (ACE-I)    Diabetes mellitus ([place]) 09/07/2019   Diabetes mellitus without complication ([facility])    Diverticulosis    DVT (deep venous thrombosis) ([facility])    Family history of breast cancer in first degree relative    GERD (gastroesophageal reflux disease)    Headache(784.0)    Hx: of   History of blood transfusion     "I've had 2; don't know what it was related to" (11/09/2012)   HLD (hyperlipidemia)    HTN (hypertension)    Iron deficiency anemia    Lymphoproliferative disorder, low grade B cell ([place]) 05/21/2020   Microcytic anemia    Peripheral vascular disease ([place])    a. L pop-tibial bypass 2011, followed by [facility].   Personal history of prostate cancer    Rotator cuff injury    "left arm; never repaired" (11/09/2012)    ROS:   All systems reviewed and negative except as noted in the HPI.   Past Surgical History:  Procedure Laterality Date   CARDIOVASCULAR STRESS TEST  Aug. 2014   CATARACT EXTRACTION W/ INTRAOCULAR LENS IMPLANT Right ~ 2008   CIRCUMCISION     COLONOSCOPY     CORONARY ANGIOPLASTY WITH STENT PLACEMENT  2014   "1" (11/09/2012)   ILIAC ARTERY ANEURYSM REPAIR     LEFT HEART CATH  05-31-12   LEFT HEART CATH AND CORONARY ANGIOGRAPHY N/A 08/01/2019   Procedure: LEFT HEART CATH AND CORONARY ANGIOGRAPHY;  Surgeon: Burnell Blanks, MD;  Location: [place] [facility];  Service: Cardiovascular;  Laterality: N/A;   LEFT HEART CATHETERIZATION WITH CORONARY ANGIOGRAM N/A 06/01/2011   Procedure: LEFT HEART CATHETERIZATION WITH CORONARY ANGIOGRAM;  Surgeon: Hillary Bow, MD;  Location: [facility] CATH LAB;  Service: Cardiovascular;  Laterality: N/A;   LEFT HEART CATHETERIZATION WITH CORONARY ANGIOGRAM N/A 05/31/2012   Procedure: LEFT HEART CATHETERIZATION WITH CORONARY ANGIOGRAM;  Surgeon: Peter M Martinique, MD;  Location: [facility] CATH LAB;  Service: Cardiovascular;  Laterality: N/A;   LEFT HEART CATHETERIZATION WITH CORONARY ANGIOGRAM N/A 08/01/2013   Procedure: LEFT HEART CATHETERIZATION WITH CORONARY ANGIOGRAM;  Surgeon: Burnell Blanks, MD;  Location: [facility] CATH LAB;  Service: Cardiovascular;  Laterality: N/A;   PERCUTANEOUS CORONARY STENT INTERVENTION (PCI-S)  05/31/2012   Procedure: PERCUTANEOUS CORONARY STENT INTERVENTION (PCI-S);  Surgeon: Peter M Martinique, MD;  Location: [facility] CATH LAB;  Service:  Cardiovascular;;   PR VEIN BYPASS GRAFT,AORTO-FEM-POP Left 10/01/2009   left below knee popliteal artery to posterior tibial artery   SHOULDER ARTHROSCOPY WITH OPEN ROTATOR CUFF REPAIR AND DISTAL CLAVICLE ACROMINECTOMY Left 01/12/2013   Procedure: LEFT SHOULDER ARTHROSCOPY WITH DEBRIDEMENT OPEN DISTAL CLAVICLE RESECTION ,acromioplastyAND ROTATOR CUFF REPAIR;  Surgeon: Yvette Rack., MD;  Location: [place];  Service: Orthopedics;  Laterality: Left;     Family History  Problem Relation Age of Onset   Breast cancer Mother        dx 7s  Hyperlipidemia Mother    Hypertension Mother    COPD Father        deceased   Hyperlipidemia Father    Hypertension Father    Hypertension Sister    Cancer Brother 82       Unknown cancer, possibly stomach   Hypertension Brother    Cancer Brother 49       Unknown, possibly lung   Hypertension Daughter    Hypertension Son    Colon cancer Neg Hx        pt is unclear about family hx     Social History   Socioeconomic History   Marital status: Widowed    Spouse name: Not on file   Number of children: Not on file   Years of education: Not on file   Highest education level: 12th grade  Occupational History   Occupation: Retired  Tobacco Use   Smoking status: Former    Packs/day: 0.50    Years: 52.00    Pack years: 26.00    Types: Cigarettes    Quit date: 05/26/2011    Years since quitting: 10.2    Passive exposure: Past   Smokeless tobacco: Never  Vaping Use   Vaping Use: Never used  Substance and Sexual Activity   Alcohol use: No   Drug use: No   Sexual activity: Not on file  Other Topics Concern   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  Lives in [place] by himself.      Patient writes with his right hand, though uses his left hand for every thing else. He lives alone in a one level home. He drinks 3 cups of coffee a day and 3 7-8 oz sodas a day. He does not exercise.   Social  Determinants of Health   Financial Resource Strain: Low Risk    Difficulty of Paying Living Expenses: Not hard at all  Food Insecurity: No Food Insecurity   Worried About Charity fundraiser in the Last Year: Never true   [place] in the Last Year: Never true  Transportation Needs: No Transportation Needs   Lack of Transportation (Medical): No   Lack of Transportation (Non-Medical): No  Physical Activity: Inactive   Days of Exercise per Week: 0 days   Minutes of Exercise per Session: 0 min  Stress: No Stress Concern Present   Feeling of Stress : Not at all  Social Connections: Not on file  Intimate Partner Violence: Not on file     BP 128/62   Pulse (!) 52   Ht '5\' 9"'$  (1.753 m)   Wt 239 lb (108.4 kg)   SpO2 96%   BMI 35.29 kg/m   Physical Exam:  Well appearing NAD HEENT: Unremarkable Neck:  No JVD, no thyromegally Lymphatics:  No adenopathy Back:  No CVA tenderness Lungs:  Clear with no wheezes HEART:  Regular rate rhythm, no murmurs, no rubs, no clicks Abd:  soft, positive bowel sounds, no organomegally, no rebound, no guarding Ext:  2 plus pulses, no edema, no cyanosis, no clubbing Skin:  No rashes no nodules Neuro:  CN II through XII intact, motor grossly intact  EKG - nsr with marked first degree AV block   Assess/Plan:  Heart block - he has progressive conduction system disease with transient CHB and baseline long first degree AV block. These episode of CHB occur during waking hours and not while asleep. I have  discussed the treatment options with the patient and recommend PPM insertion.  CAD - he denies anginal symptoms. Once his PPM is inserted , would encourage a beta blocker NSVT - he is symptomatic and hopefully this can be controlled with medical therapy after his PPM is inserted.  Carleene Overlie Shiro Ellerman,MD

## 2021-08-04 NOTE — Patient Instructions (Addendum)
Medication Instructions:  ?Your physician recommends that you continue on your current medications as directed. Please refer to the Current Medication list given to you today. ? ?Labwork: ?None ordered. ? ?Testing/Procedures: ?None ordered. ? ?Follow-Up: ? ?SEE INSTRUCTION LETTER ? ?Any Other Special Instructions Will Be Listed Below (If Applicable). ? ?If you need a refill on your cardiac medications before your next appointment, please call your pharmacy.  ? ?Pacemaker Implantation, Adult ?Pacemaker implantation is a procedure to place a pacemaker inside the chest. A pacemaker is a small computer that sends electrical signals to the heart and helps the heart beat normally. A pacemaker also stores information about heart rhythms. You may need pacemaker implantation if you have: ?A slow heartbeat (bradycardia). ?Loss of consciousness that happens repeatedly (syncope) or repeated episodes of dizziness or light-headedness because of an irregular heart rate. ?Shortness of breath (dyspnea) due to heart problems. ?The pacemaker usually attaches to your heart through a wire called a lead. One or two leads may be needed. There are different types of pacemakers: ?Transvenous pacemaker. This type is placed under the skin or muscle of your upper chest area. The lead goes through a vein in the chest area to reach the inside of the heart. ?Epicardial pacemaker. This type is placed under the skin or muscle of your chest or abdomen. The lead goes through your chest to the outside of the heart. ?Tell a health care provider about: ?Any allergies you have. ?All medicines you are taking, including vitamins, herbs, eye drops, creams, and over-the-counter medicines. ?Any problems you or family members have had with anesthetic medicines. ?Any blood or bone disorders you have. ?Any surgeries you have had. ?Any medical conditions you have. ?Whether you are pregnant or may be pregnant. ?What are the risks? ?Generally, this is a safe  procedure. However, problems may occur, including: ?Infection. ?Bleeding. ?Failure of the pacemaker or the lead. ?Collapse of a lung or bleeding into a lung. ?Blood clot inside a blood vessel with a lead. ?Damage to the heart. ?Infection inside the heart (endocarditis). ?Allergic reactions to medicines. ?What happens before the procedure? ?Staying hydrated ?Follow instructions from your health care provider about hydration, which may include: ?Up to 2 hours before the procedure - you may continue to drink clear liquids, such as water, clear fruit juice, black coffee, and plain tea. ? ?Eating and drinking restrictions ?Follow instructions from your health care provider about eating and drinking, which may include: ?8 hours before the procedure - stop eating heavy meals or foods, such as meat, fried foods, or fatty foods. ?6 hours before the procedure - stop eating light meals or foods, such as toast or cereal. ?6 hours before the procedure - stop drinking milk or drinks that contain milk. ?2 hours before the procedure - stop drinking clear liquids. ?Medicines ?Ask your health care provider about: ?Changing or stopping your regular medicines. This is especially important if you are taking diabetes medicines or blood thinners. ?Taking medicines such as aspirin and ibuprofen. These medicines can thin your blood. Do not take these medicines unless your health care provider tells you to take them. ?Taking over-the-counter medicines, vitamins, herbs, and supplements. ?Tests ?You may have: ?A heart evaluation. This may include: ?An electrocardiogram (ECG). This involves placing patches on your skin to check your heart rhythm. ?A chest X-ray. ?An echocardiogram. This is a test that uses sound waves (ultrasound) to produce an image of the heart. ?A cardiac rhythm monitor. This is used to record your heart rhythm  and any events for a longer period of time. ?Blood tests. ?Genetic testing. ?General instructions ?Do not use any  products that contain nicotine or tobacco for at least 4 weeks before the procedure. These products include cigarettes, e-cigarettes, and chewing tobacco. If you need help quitting, ask your health care provider. ?Ask your health care provider: ?How your surgery site will be marked. ?What steps will be taken to help prevent infection. These steps may include: ?Removing hair at the surgery site. ?Washing skin with a germ-killing soap. ?Receiving antibiotic medicine. ?Plan to have someone take you home from the hospital or clinic. ?If you will be going home right after the procedure, plan to have someone with you for 24 hours. ?What happens during the procedure? ?An IV will be inserted into one of your veins. ?You will be given one or more of the following: ?A medicine to help you relax (sedative). ?A medicine to numb the area (local anesthetic). ?A medicine to make you fall asleep (general anesthetic). ?The next steps vary depending on the type of pacemaker you will be getting. ?If you are getting a transvenous pacemaker: ?An incision will be made in your upper chest. ?A pocket will be made for the pacemaker. It may be placed under the skin or between layers of muscle. ?The lead will be inserted into a blood vessel that goes to the heart. ?While X-rays are taken by an imaging machine (fluoroscopy), the lead will be advanced through the vein to the inside of your heart. ?The other end of the lead will be tunneled under the skin and attached to the pacemaker. ?If you are getting an epicardial pacemaker: ?An incision will be made near your ribs or breastbone (sternum) for the lead. ?The lead will be attached to the outside of your heart. ?Another incision will be made in your chest or upper abdomen to create a pocket for the pacemaker. ?The free end of the lead will be tunneled under the skin and attached to the pacemaker. ?The transvenous or epicardial pacemaker will be tested. Imaging studies may be done to check the  lead position. ?The incisions will be closed with stitches (sutures), adhesive strips, or skin glue. ?Bandages (dressings) will be placed over the incisions. ?The procedure may vary among health care providers and hospitals. ?What happens after the procedure? ?Your blood pressure, heart rate, breathing rate, and blood oxygen level will be monitored until you leave the hospital or clinic. ?You may be given antibiotics. ?You will be given pain medicine. ?An ECG and chest X-rays will be done. ?You may need to wear a continuous type of ECG (Holter monitor) to check your heart rhythm. ?Your health care provider will program the pacemaker. ?If you were given a sedative during the procedure, it can affect you for several hours. Do not drive or operate machinery until your health care provider says that it is safe. ?You will be given a pacemaker identification card. This card lists the implant date, device model, and manufacturer of your pacemaker. ?Summary ?A pacemaker is a small computer that sends electrical signals to the heart and helps the heart beat normally. ?There are different types of pacemakers. A pacemaker may be placed under the skin or muscle of your chest or abdomen. ?Follow instructions from your health care provider about eating and drinking and about taking medicines before the procedure. ?This information is not intended to replace advice given to you by your health care provider. Make sure you discuss any questions you have with  your health care provider. ?Document Revised: 12/09/2020 Document Reviewed: 02/28/2019 ?Elsevier Patient Education ? [employer]. ? ?

## 2021-08-09 NOTE — Telephone Encounter (Signed)
Thank you :)

## 2021-08-18 ENCOUNTER — Other Ambulatory Visit: Payer: Self-pay

## 2021-08-18 DIAGNOSIS — I7141 Pararenal abdominal aortic aneurysm, without rupture: Secondary | ICD-10-CM

## 2021-08-21 ENCOUNTER — Ambulatory Visit
Admission: RE | Admit: 2021-08-21 | Discharge: 2021-08-21 | Disposition: A | Discharge status: Home / Self Care | Payer: Medicare HMO | Source: Ambulatory Visit | Attending: Vascular Surgery | Admitting: Vascular Surgery

## 2021-08-21 DIAGNOSIS — I7141 Pararenal abdominal aortic aneurysm, without rupture: Secondary | ICD-10-CM

## 2021-08-21 DIAGNOSIS — I723 Aneurysm of iliac artery: Secondary | ICD-10-CM | POA: Diagnosis not present

## 2021-08-21 DIAGNOSIS — K551 Chronic vascular disorders of intestine: Secondary | ICD-10-CM | POA: Diagnosis not present

## 2021-08-21 DIAGNOSIS — N289 Disorder of kidney and ureter, unspecified: Secondary | ICD-10-CM | POA: Diagnosis not present

## 2021-08-21 DIAGNOSIS — I359 Nonrheumatic aortic valve disorder, unspecified: Secondary | ICD-10-CM | POA: Diagnosis not present

## 2021-08-21 MED ORDER — IOPAMIDOL (ISOVUE-370) INJECTION 76%
75.0000 mL | Freq: Once | INTRAVENOUS | Status: AC | PRN
  Administered 2021-08-21: 75 mL via INTRAVENOUS

## 2021-08-27 ENCOUNTER — Ambulatory Visit: Payer: Medicare HMO | Admitting: Vascular Surgery

## 2021-08-31 ENCOUNTER — Ambulatory Visit (HOSPITAL_BASED_OUTPATIENT_CLINIC_OR_DEPARTMENT_OTHER): Payer: Medicare HMO | Attending: Physician Assistant | Admitting: Cardiology

## 2021-08-31 DIAGNOSIS — R0681 Apnea, not elsewhere classified: Secondary | ICD-10-CM

## 2021-08-31 DIAGNOSIS — R400 Somnolence: Secondary | ICD-10-CM | POA: Insufficient documentation

## 2021-08-31 DIAGNOSIS — G4733 Obstructive sleep apnea (adult) (pediatric): Secondary | ICD-10-CM | POA: Insufficient documentation

## 2021-08-31 DIAGNOSIS — R5383 Other fatigue: Secondary | ICD-10-CM | POA: Diagnosis not present

## 2021-09-02 NOTE — Pre-Procedure Instructions (Signed)
Instructed patient on the following items: Arrival time 0900 Nothing to eat or drink after midnight No meds AM of procedure Responsible person to drive you home and stay with you for 24 hrs Wash with special soap night before and morning of procedure

## 2021-09-03 ENCOUNTER — Ambulatory Visit (HOSPITAL_COMMUNITY): Payer: Medicare HMO

## 2021-09-03 ENCOUNTER — Ambulatory Visit (HOSPITAL_COMMUNITY)
Admission: RE | Admit: 2021-09-03 | Discharge: 2021-09-03 | Disposition: A | Discharge status: Home / Self Care | Payer: Medicare HMO | Attending: Internal Medicine | Admitting: Internal Medicine

## 2021-09-03 ENCOUNTER — Other Ambulatory Visit: Payer: Self-pay

## 2021-09-03 ENCOUNTER — Ambulatory Visit (HOSPITAL_COMMUNITY)
Admission: RE | Disposition: A | Discharge status: Home / Self Care | Payer: Medicare HMO | Source: Home / Self Care | Attending: Internal Medicine

## 2021-09-03 DIAGNOSIS — E1151 Type 2 diabetes mellitus with diabetic peripheral angiopathy without gangrene: Secondary | ICD-10-CM | POA: Insufficient documentation

## 2021-09-03 DIAGNOSIS — I2510 Atherosclerotic heart disease of native coronary artery without angina pectoris: Secondary | ICD-10-CM | POA: Insufficient documentation

## 2021-09-03 DIAGNOSIS — Z87891 Personal history of nicotine dependence: Secondary | ICD-10-CM | POA: Insufficient documentation

## 2021-09-03 DIAGNOSIS — N182 Chronic kidney disease, stage 2 (mild): Secondary | ICD-10-CM | POA: Diagnosis not present

## 2021-09-03 DIAGNOSIS — Z7984 Long term (current) use of oral hypoglycemic drugs: Secondary | ICD-10-CM | POA: Insufficient documentation

## 2021-09-03 DIAGNOSIS — E1122 Type 2 diabetes mellitus with diabetic chronic kidney disease: Secondary | ICD-10-CM | POA: Insufficient documentation

## 2021-09-03 DIAGNOSIS — I442 Atrioventricular block, complete: Secondary | ICD-10-CM | POA: Diagnosis not present

## 2021-09-03 DIAGNOSIS — Z950 Presence of cardiac pacemaker: Secondary | ICD-10-CM | POA: Diagnosis not present

## 2021-09-03 DIAGNOSIS — I129 Hypertensive chronic kidney disease with stage 1 through stage 4 chronic kidney disease, or unspecified chronic kidney disease: Secondary | ICD-10-CM | POA: Insufficient documentation

## 2021-09-03 DIAGNOSIS — I472 Ventricular tachycardia, unspecified: Secondary | ICD-10-CM | POA: Diagnosis not present

## 2021-09-03 DIAGNOSIS — R001 Bradycardia, unspecified: Secondary | ICD-10-CM | POA: Diagnosis not present

## 2021-09-03 HISTORY — PX: PACEMAKER IMPLANT: EP1218

## 2021-09-03 LAB — BASIC METABOLIC PANEL
Anion gap: 8 (ref 5–15)
BUN: 7 mg/dL — ABNORMAL LOW (ref 8–23)
CO2: 26 mmol/L (ref 22–32)
Calcium: 9.1 mg/dL (ref 8.9–10.3)
Chloride: 105 mmol/L (ref 98–111)
Creatinine, Ser: 1.01 mg/dL (ref 0.61–1.24)
GFR, Estimated: >60 mL/min (ref >60)
Glucose, Bld: 139 mg/dL — ABNORMAL HIGH (ref 70–99)
Potassium: 3.7 mmol/L (ref 3.5–5.1)
Sodium: 139 mmol/L (ref 135–145)

## 2021-09-03 LAB — CBC
HCT: 35.3 % — ABNORMAL LOW (ref 39.0–52.0)
Hemoglobin: 10.5 g/dL — ABNORMAL LOW (ref 13.0–17.0)
MCH: 21.7 pg — ABNORMAL LOW (ref 26.0–34.0)
MCHC: 29.7 g/dL — ABNORMAL LOW (ref 30.0–36.0)
MCV: 73.1 fL — ABNORMAL LOW (ref 80.0–100.0)
Platelets: 195 10*3/uL (ref 150–400)
RBC: 4.83 MIL/uL (ref 4.22–5.81)
RDW: 17.4 % — ABNORMAL HIGH (ref 11.5–15.5)
WBC: 6.6 10*3/uL (ref 4.0–10.5)
nRBC: 0 % (ref 0.0–0.2)

## 2021-09-03 SURGERY — PACEMAKER IMPLANT

## 2021-09-03 MED ORDER — HEPARIN (PORCINE) IN NACL 1000-0.9 UT/500ML-% IV SOLN
INTRAVENOUS | Status: AC
  Filled 2021-09-03: qty 500

## 2021-09-03 MED ORDER — CHLORHEXIDINE GLUCONATE 4 % EX LIQD
4.0000 | Freq: Once | CUTANEOUS | Status: DC
Start: 2021-09-03 — End: 2021-09-03

## 2021-09-03 MED ORDER — HEPARIN (PORCINE) IN NACL 1000-0.9 UT/500ML-% IV SOLN
INTRAVENOUS | Status: DC | PRN
  Administered 2021-09-03: 500 mL

## 2021-09-03 MED ORDER — SODIUM CHLORIDE 0.9 % IV SOLN
80.0000 mg | INTRAVENOUS | Status: AC
  Administered 2021-09-03: 80 mg

## 2021-09-03 MED ORDER — FENTANYL CITRATE (PF) 100 MCG/2ML IJ SOLN
INTRAMUSCULAR | Status: DC | PRN
  Administered 2021-09-03: 25 ug via INTRAVENOUS

## 2021-09-03 MED ORDER — ACETAMINOPHEN 325 MG PO TABS: 325.0000 mg | ORAL_TABLET | ORAL | Status: DC | PRN

## 2021-09-03 MED ORDER — LIDOCAINE HCL (PF) 1 % IJ SOLN
INTRAMUSCULAR | Status: DC | PRN
  Administered 2021-09-03: 60 mL

## 2021-09-03 MED ORDER — SODIUM CHLORIDE 0.9 % IV SOLN: INTRAVENOUS | Status: DC

## 2021-09-03 MED ORDER — SODIUM CHLORIDE 0.9 % IV SOLN
INTRAVENOUS | Status: AC
  Filled 2021-09-03: qty 2

## 2021-09-03 MED ORDER — CEFAZOLIN SODIUM-DEXTROSE 2-4 GM/100ML-% IV SOLN
INTRAVENOUS | Status: AC
  Filled 2021-09-03: qty 100

## 2021-09-03 MED ORDER — CEFAZOLIN SODIUM-DEXTROSE 1-4 GM/50ML-% IV SOLN
1.0000 g | Freq: Once | INTRAVENOUS | Status: AC
  Administered 2021-09-03: 1 g via INTRAVENOUS
  Filled 2021-09-03 (×2): qty 50

## 2021-09-03 MED ORDER — LIDOCAINE HCL 1 % IJ SOLN
INTRAMUSCULAR | Status: AC
  Filled 2021-09-03: qty 20

## 2021-09-03 MED ORDER — LIDOCAINE HCL 1 % IJ SOLN
INTRAMUSCULAR | Status: AC
Start: 2021-09-03 — End: ?
  Filled 2021-09-03: qty 20

## 2021-09-03 MED ORDER — MIDAZOLAM HCL 5 MG/5ML IJ SOLN
INTRAMUSCULAR | Status: AC
  Filled 2021-09-03: qty 5

## 2021-09-03 MED ORDER — POVIDONE-IODINE 10 % EX SWAB: 2.0000 "application " | Freq: Once | CUTANEOUS | Status: DC

## 2021-09-03 MED ORDER — MIDAZOLAM HCL 5 MG/5ML IJ SOLN
INTRAMUSCULAR | Status: DC | PRN
  Administered 2021-09-03: 2 mg via INTRAVENOUS

## 2021-09-03 MED ORDER — FENTANYL CITRATE (PF) 100 MCG/2ML IJ SOLN
INTRAMUSCULAR | Status: AC
Start: 2021-09-03 — End: ?
  Filled 2021-09-03: qty 2

## 2021-09-03 MED ORDER — CEFAZOLIN SODIUM-DEXTROSE 2-4 GM/100ML-% IV SOLN
2.0000 g | INTRAVENOUS | Status: AC
  Administered 2021-09-03: 2 g via INTRAVENOUS

## 2021-09-03 MED ORDER — ONDANSETRON HCL 4 MG/2ML IJ SOLN: 4.0000 mg | Freq: Four times a day (QID) | INTRAMUSCULAR | Status: DC | PRN

## 2021-09-03 SURGICAL SUPPLY — 14 items
CABLE SURGICAL S-101-97-12 (CABLE) ×2 IMPLANT
CATH CPS LOCATOR 3D LG (CATHETERS) ×1 IMPLANT
CPS IMPLANT KIT 410190 (MISCELLANEOUS) ×1 IMPLANT
HELIX LOCKING TOOL (MISCELLANEOUS) ×2
LEAD TENDRIL MRI 52CM LPA1200M (Lead) ×1 IMPLANT
LEAD TENDRIL SDX 2088TC-58CM (Lead) ×1 IMPLANT
MAT PREVALON FULL STRYKER (MISCELLANEOUS) ×1 IMPLANT
PACEMAKER ASSURITY DR-RF (Pacemaker) ×1 IMPLANT
PAD DEFIB RADIO PHYSIO CONN (PAD) ×2 IMPLANT
SHEATH 8FR PRELUDE SNAP 13 (SHEATH) ×1 IMPLANT
SHEATH 9FR PRELUDE SNAP 13 (SHEATH) ×1 IMPLANT
SLITTER AGILIS HISPRO (INSTRUMENTS) ×1 IMPLANT
TOOL HELIX LOCKING (MISCELLANEOUS) IMPLANT
TRAY PACEMAKER INSERTION (PACKS) ×2 IMPLANT

## 2021-09-03 NOTE — Discharge Instructions (Addendum)
After Your Pacemaker   You have a [employer] Pacemaker  ACTIVITY Do not lift your arm above shoulder height for 1 week after your procedure. After 7 days, you may progress as below.  You should remove your sling 24 hours after your procedure, unless otherwise instructed by your provider.     Thursday September 10, 2021  Friday September 11, 2021 Saturday September 12, 2021 Sunday September 13, 2021   Do not lift, push, pull, or carry anything over 10 pounds with the affected arm until 6 weeks (Thursday October 15, 2021 ) after your procedure.   You may drive AFTER your wound check, unless you have been told otherwise by your provider.   Ask your healthcare provider when you can go back to work   INCISION/Dressing If you are on a blood thinner such as Coumadin, Xarelto, Eliquis, Plavix, or Pradaxa please confirm with your provider when this should be resumed.   If large square, outer bandage is left in place, this can be removed after 24 hours from your procedure. Do not remove steri-strips or glue as below.   Monitor your Pacemaker site for redness, swelling, and drainage. Call the device clinic at [phone] if you experience these symptoms or fever/chills.  If your incision is sealed with Steri-strips or staples, you may shower 7 days after your procedure or when told by your provider. Do not remove the steri-strips or let the shower hit directly on your site. You may wash around your site with soap and water.    If you were discharged in a sling, please do not wear this during the day more than 48 hours after your surgery unless otherwise instructed. This may increase the risk of stiffness and soreness in your shoulder.   Avoid lotions, ointments, or perfumes over your incision until it is well-healed.  You may use a hot tub or a pool AFTER your wound check appointment if the incision is completely closed.  Pacemaker Alerts:  Some alerts are vibratory and others beep. These are NOT emergencies. Please call  our office to let us know. If this occurs at night or on weekends, it can wait until the next business day. Send a remote transmission.  If your device is capable of reading fluid status (for heart failure), you will be offered monthly monitoring to review this with you.   DEVICE MANAGEMENT Remote monitoring is used to monitor your pacemaker from home. This monitoring is scheduled every 91 days by our office. It allows [place] to keep an eye on the functioning of your device to ensure it is working properly. You will routinely see your Electrophysiologist annually (more often if necessary).   You should receive your ID card for your new device in 4-8 weeks. Keep this card with you at all times once received. Consider wearing a medical alert bracelet or necklace.  Your Pacemaker may be MRI compatible. This will be discussed at your next office visit/wound check.  You should avoid contact with strong electric or magnetic fields.   Do not use amateur (ham) radio equipment or electric (arc) welding torches. MP3 player headphones with magnets should not be used. Some devices are safe to use if held at least 12 inches (30 cm) from your Pacemaker. These include power tools, lawn mowers, and speakers. If you are unsure if something is safe to use, ask your health care provider.  When using your cell phone, hold it to the ear that is on the opposite side from  the Pacemaker. Do not leave your cell phone in a pocket over the Pacemaker.  You may safely use electric blankets, heating pads, computers, and microwave ovens.  Call the office right away if: You have chest pain. You feel more short of breath than you have felt before. You feel more light-headed than you have felt before. Your incision starts to open up.  This information is not intended to replace advice given to you by your health care provider. Make sure you discuss any questions you have with your health care provider.

## 2021-09-03 NOTE — Progress Notes (Signed)
Dr Lovena Le notified of CXR-orders to proceed with d/c

## 2021-09-03 NOTE — Interval H&P Note (Signed)
History and Physical Interval Note:  09/03/2021 11:09 AM  Joel [name]  has presented today for surgery, with the diagnosis of heart block.  The various methods of treatment have been discussed with the patient and family. After consideration of risks, benefits and other options for treatment, the patient has consented to  Procedure(s): PACEMAKER IMPLANT (N/A) as a surgical intervention.  The patient's history has been reviewed, patient examined, no change in status, stable for surgery.  I have reviewed the patient's chart and labs.  Questions were answered to the patient's satisfaction.     Cristopher Peru

## 2021-09-04 ENCOUNTER — Encounter (HOSPITAL_COMMUNITY): Payer: Self-pay | Admitting: Internal Medicine

## 2021-09-04 ENCOUNTER — Telehealth: Payer: Self-pay

## 2021-09-04 MED FILL — Lidocaine HCl Local Inj 1%: INTRAMUSCULAR | Qty: 60 | Status: AC

## 2021-09-04 NOTE — Telephone Encounter (Signed)
Follow-up after same day discharge: Implant date: 09/03/21 MD: Cristopher Peru, MD Device: SJ PPM Location: L.Chest   Wound check visit: 09/16/2021 90 day MD follow-up: 12/17/2021  Remote Transmission received:Monitor updated   Dressing/sling removed: No patient will remove clear dressing later this evening    Spoke with patient he voiced understanding of lifting restrictions, wound check appointment on 09/16/2021 at 2:40pm. Patient voiced understanding of not getting dressing wet until 09/10/2021.

## 2021-09-04 NOTE — Telephone Encounter (Signed)
-----   Message from [place], [place] sent at 09/03/2021  2:03 PM EDT ----- Same day d/c  [facility] PPM  GT

## 2021-09-06 NOTE — Procedures (Signed)
   Patient Name: Joel [name], Joel [name] Date: 08/31/2021 Gender: Male D.O.B: May 22, 1940 Age (years): 63 Referring Provider: Tami Lin Duke Height (inches): 75 Interpreting Physician: Fransico Him MD, ABSM Weight (lbs): 238 RPSGT: Carolin Coy BMI: 35 MRN: [id number] Neck Size: 16.50  CLINICAL INFORMATION The patient is referred for a split night study with BPAP.  MEDICATIONS Medications self-administered by patient taken the night of the study : PRAVASTATIN, GABAPENTIN, TAMSULOSIN HYDROCHLORIDE  SLEEP STUDY TECHNIQUE As per the AASM Manual for the Scoring of Sleep and Associated Events v2.3 (April 2016) with a hypopnea requiring 4% desaturations.  The channels recorded and monitored were frontal, central and occipital EEG, electrooculogram (EOG), submentalis EMG (chin), nasal and oral airflow, thoracic and abdominal wall motion, anterior tibialis EMG, snore microphone, electrocardiogram, and pulse oximetry. Bi-level positive airway pressure (BiPAP) was initiated when the patient met split night criteria and was titrated according to treat sleep-disordered breathing.  RESPIRATORY PARAMETERS Diagnostic Total AHI (/hr): 65.8  RDI (/hr):65.8  OA Index (/hr): 6  CA Index (/hr): 0.9 REM AHI (/hr): 53.5  NREM AHI (/hr):69.9  Supine AHI (/hr):78.4  Non-supine AHI (/hr):64.57 Min O2 Sat (%):58.0  Mean O2 (%): 91.1  Time below 88% (min):36.6   Titration Optimal IPAP Pressure (cm): N/A  Optimal EPAP Pressure (cm):N/A  AHI at Optimal Pressure (/hr):N/A  Min O2 at Optimal Pressure (%):N/A Sleep % at Optimal (%):N/A  Supine % at Optimal (%):55   SLEEP ARCHITECTURE The study was initiated at 10:09:18 PM and terminated at 4:54:26 AM. The total recorded time was 405.1 minutes. EEG confirmed total sleep time was 320.5 minutes yielding a sleep efficiency of 79.1%. Sleep onset after lights out was 16.7 minutes with a REM latency of 44.5 minutes. The patient spent 28.7% of the night  in stage N1 sleep, 40.1% in stage N2 sleep, 0.0% in stage N3 and 31.2% in REM. Wake after sleep onset (WASO) was 68.0 minutes. The Arousal Index was 34.5/hour.  LEG MOVEMENT DATA The total Periodic Limb Movements of Sleep (PLMS) were 0. The PLMS index was 0.0 .  CARDIAC DATA The 2 lead EKG demonstrated sinus rhythm. The mean heart rate was 100.0 beats per minute. Other EKG findings include: None.  IMPRESSIONS - Severe obstructive sleep apnea occurred during the diagnostic portion of the study (AHI = 65.8 /hour). An optimal PAP pressure could not be selected do to ongoing respiratory events.  - Mild central sleep apnea occurred during the diagnostic portion of the study (CAI = 0.9/hour). - Severe oxygen desaturation was noted during the diagnostic portion of the study (Min O2 = 58.0%). - The patient snored with moderate snoring volume during the diagnostic portion of the study. - No cardiac abnormalities were noted during this study. - Clinically significant periodic limb movements of sleep did not occur during the study.  DIAGNOSIS - Obstructive Sleep Apnea (G47.33) - Nocturnal Hypoxemia  RECOMMENDATIONS - Recommend full night BiPAP titration.  - Avoid alcohol, sedatives and other CNS depressants that may worsen sleep apnea and disrupt normal sleep architecture. - Sleep hygiene should be reviewed to assess factors that may improve sleep quality. - Weight management and regular exercise should be initiated or continued.  [Electronically signed] 09/06/2021 07:22 PM  Fransico Him MD, ABSM Diplomate, American Board of Sleep Medicine

## 2021-09-09 ENCOUNTER — Ambulatory Visit: Payer: Medicare HMO | Admitting: Cardiovascular Disease

## 2021-09-15 ENCOUNTER — Other Ambulatory Visit: Payer: Self-pay | Admitting: Cardiovascular Disease

## 2021-09-16 ENCOUNTER — Telehealth: Payer: Self-pay | Admitting: *Deleted

## 2021-09-16 ENCOUNTER — Ambulatory Visit (INDEPENDENT_AMBULATORY_CARE_PROVIDER_SITE_OTHER): Payer: Medicare HMO

## 2021-09-16 DIAGNOSIS — G4733 Obstructive sleep apnea (adult) (pediatric): Secondary | ICD-10-CM

## 2021-09-16 DIAGNOSIS — I459 Conduction disorder, unspecified: Secondary | ICD-10-CM

## 2021-09-16 DIAGNOSIS — R400 Somnolence: Secondary | ICD-10-CM

## 2021-09-16 DIAGNOSIS — R0681 Apnea, not elsewhere classified: Secondary | ICD-10-CM

## 2021-09-16 LAB — CUP PACEART INCLINIC DEVICE CHECK
Battery Remaining Longevity: 94 mo
Battery Voltage: 3.11 V
Brady Statistic RA Percent Paced: 38 %
Brady Statistic RV Percent Paced: 97 %
Date Time Interrogation Session: 20230607152900
Implantable Lead Implant Date: 20230525
Implantable Lead Implant Date: 20230525
Implantable Lead Location: 753859
Implantable Lead Location: 753860
Implantable Pulse Generator Implant Date: 20230525
Lead Channel Impedance Value: 425 Ohm
Lead Channel Impedance Value: 437.5 Ohm
Lead Channel Pacing Threshold Amplitude: 0.5 V
Lead Channel Pacing Threshold Amplitude: 0.875 V
Lead Channel Pacing Threshold Pulse Width: 0.5 ms
Lead Channel Pacing Threshold Pulse Width: 0.5 ms
Lead Channel Sensing Intrinsic Amplitude: 5 mV
Lead Channel Sensing Intrinsic Amplitude: 5.4 mV
Lead Channel Setting Pacing Amplitude: 1.125
Lead Channel Setting Pacing Amplitude: 3.5 V
Lead Channel Setting Pacing Pulse Width: 0.5 ms
Lead Channel Setting Sensing Sensitivity: 4 mV
Pulse Gen Model: 2272
Pulse Gen Serial Number: 8084442

## 2021-09-16 NOTE — Patient Instructions (Addendum)
   After Your Pacemaker   Monitor your pacemaker site for redness, swelling, and drainage. Call the device clinic at [phone] if you experience these symptoms or fever/chills.  Your incision was closed with Steri-strips or staples:  You may shower 7 days after your procedure and wash your incision with soap and water. Avoid lotions, ointments, or perfumes over your incision until it is well-healed.  You may use a hot tub or a pool after your wound check appointment if the incision is completely closed.  Do not lift, push or pull greater than 10 pounds with the affected arm until 6 weeks after your procedure. There are no other restrictions in arm movement after your wound check appointment. July 6  You may drive, unless driving has been restricted by your healthcare providers.  Your Pacemaker is   MRI compatible.  Remote monitoring is used to monitor your pacemaker from home. This monitoring is scheduled every 91 days by our office. It allows [place] to keep an eye on the functioning of your device to ensure it is working properly. You will routinely see your Electrophysiologist annually (more often if necessary).

## 2021-09-16 NOTE — Telephone Encounter (Signed)
-----   Message from Lauralee Evener, [place] sent at 09/08/2021  8:23 AM EDT -----  ----- Message ----- From: Sueanne Margarita, MD Sent: 09/06/2021   7:24 PM EDT To: [facility] Sleep Studies  Please let patient know that he has severe OSA but had unsuccessful CPAP titration due to severity of OSA - please set up for full night BipAP titration

## 2021-09-16 NOTE — Progress Notes (Signed)
Wound check appointment s/p PPM placement 09/03/21. Steri-strips removed. Wound without redness or edema. Incision edges approximated, wound well healed. Normal device function. Thresholds, sensing, and impedances consistent with implant measurements. Device programmed at 3.5V/auto capture programmed on for extra safety margin until 3 month visit. Histogram distribution appropriate for patient and level of activity. No mode switches or high ventricular rates noted. Patient educated about wound care, arm mobility, lifting restrictions. Patient enrolled in remote monitoring and encouraged to send an activation transmission today. Next scheduled transmission 12/11/21. 91 day follow up with Dr. Lovena Le 12/17/21.

## 2021-09-16 NOTE — Telephone Encounter (Signed)
The patient has been notified of the result and verbalized understanding.  All questions (if any) were answered. Marolyn Hammock, [place] 09/16/2021 12:09 PM   Patient states he has too many test going on at one time and he needs a little breathing space.   Bipap titration to precert

## 2021-09-17 DIAGNOSIS — M5416 Radiculopathy, lumbar region: Secondary | ICD-10-CM | POA: Diagnosis not present

## 2021-09-18 ENCOUNTER — Telehealth: Payer: Self-pay | Admitting: *Deleted

## 2021-09-18 NOTE — Telephone Encounter (Signed)
   Pre-operative Risk Assessment    Patient Name: Joel [name]  DOB: 1940/09/24 MRN: [id number]      Request for Surgical Clearance    Procedure:  LUMBAR  EPIDURAL STEROID INJECTION WITH DEXAMETHASONE   Date of Surgery:  Clearance 10/12/21                                 Surgeon:  DR. Maia Petties Surgeon's Group or Practice Name:  [facility]  Phone number:  [phone] Fax number:  [phone]   Type of Clearance Requested:   - Medical ; PT IS ON ASA; THOUGH NO MEDICATIONS ARE LISTED AS NEEDING TO BE HELD   Type of Anesthesia:  Not Indicated   Additional requests/questions:    Jiles Prows   09/18/2021, 3:18 PM

## 2021-09-21 NOTE — Telephone Encounter (Signed)
Agree with assessment. Ok to hold ASA.

## 2021-09-21 NOTE — Telephone Encounter (Signed)
Dr. Lovena Le, patient had pacemaker implant 09/03/21. His wound check visit was complted on 09/16/21. Is he cleared to proceed with the following and may he hold aspirin if they request him to do so.  Procedure:  LUMBAR  EPIDURAL STEROID INJECTION WITH DEXAMETHASONE    Date of Surgery:  Clearance 10/12/21          Please route your response to p cv div preop  Thank you,  Emmaline Life, NP-C    09/21/2021, 3:08 PM [place] [address] Office [phone] Fax [phone]

## 2021-09-22 ENCOUNTER — Encounter: Payer: Self-pay | Admitting: Family Medicine

## 2021-09-22 ENCOUNTER — Ambulatory Visit (INDEPENDENT_AMBULATORY_CARE_PROVIDER_SITE_OTHER): Payer: Medicare HMO | Admitting: Family Medicine

## 2021-09-22 VITALS — BP 130/70 | HR 77 | Wt 237.8 lb

## 2021-09-22 DIAGNOSIS — R058 Other specified cough: Secondary | ICD-10-CM | POA: Diagnosis not present

## 2021-09-22 MED ORDER — AZITHROMYCIN 500 MG PO TABS: 500.0000 mg | ORAL_TABLET | Freq: Every day | ORAL | 0 refills | Status: DC

## 2021-09-22 NOTE — Telephone Encounter (Signed)
   Primary Cardiologist: Lauree Chandler, MD  Chart reviewed as part of pre-operative protocol coverage. Given past medical history and time since last visit, based on ACC/AHA guidelines, Joel [name] would be at acceptable risk for the planned procedure without further cardiovascular testing.   Per Dr. Lovena Le, patient may proceed with lumber ESI and may hold his aspirin for 7 days prior to procedure. He should resume aspirin as soon as hemodynamically stable following procedure.   I will route this recommendation to the requesting party via Epic fax function and remove from pre-op pool.  Please call with questions.  Emmaline Life, NP-C    09/22/2021, 8:26 AM [place] [address] Office [phone] Fax [phone]

## 2021-09-22 NOTE — Progress Notes (Signed)
   Subjective:    [name], male    DOB: 19-Jan-1941, 81 y.o.   MRN: [id number]  HPI He complains of a 2-week history of difficulty of intermittent productive cough that is occasionally productive.  No fever, chills, sore throat, earache or shortness of breath.   Review of Systems     Objective:   Physical Exam Alert and in no distress. Tympanic membranes and canals are normal. Pharyngeal area is normal. Neck is supple without adenopathy or thyromegaly. Cardiac exam shows a regular sinus rhythm without murmurs or gallops. Lungs are clear to auscultation.        Assessment & Plan:  Productive cough - Plan: azithromycin (ZITHROMAX) 500 MG tablet Since has been going on for couple weeks and things reasonable to see if we can give him some relief.  I explained that the medicine last longer than the 3 days that he is taking it and he will call if he has any difficulty.

## 2021-09-24 ENCOUNTER — Other Ambulatory Visit: Payer: Self-pay | Admitting: Cardiovascular Disease

## 2021-09-29 ENCOUNTER — Emergency Department (HOSPITAL_COMMUNITY)
Admission: EM | Admit: 2021-09-29 | Discharge: 2021-09-30 | Disposition: A | Discharge status: Home / Self Care | Payer: Medicare HMO | Attending: Emergency Medicine | Admitting: Emergency Medicine

## 2021-09-29 ENCOUNTER — Emergency Department (HOSPITAL_COMMUNITY): Payer: Medicare HMO

## 2021-09-29 ENCOUNTER — Other Ambulatory Visit: Payer: Self-pay

## 2021-09-29 ENCOUNTER — Encounter (HOSPITAL_COMMUNITY): Payer: Self-pay

## 2021-09-29 DIAGNOSIS — J449 Chronic obstructive pulmonary disease, unspecified: Secondary | ICD-10-CM | POA: Diagnosis not present

## 2021-09-29 DIAGNOSIS — I2510 Atherosclerotic heart disease of native coronary artery without angina pectoris: Secondary | ICD-10-CM | POA: Insufficient documentation

## 2021-09-29 DIAGNOSIS — R918 Other nonspecific abnormal finding of lung field: Secondary | ICD-10-CM | POA: Diagnosis not present

## 2021-09-29 DIAGNOSIS — R0602 Shortness of breath: Secondary | ICD-10-CM | POA: Insufficient documentation

## 2021-09-29 DIAGNOSIS — Z8546 Personal history of malignant neoplasm of prostate: Secondary | ICD-10-CM | POA: Diagnosis not present

## 2021-09-29 DIAGNOSIS — Z7982 Long term (current) use of aspirin: Secondary | ICD-10-CM | POA: Diagnosis not present

## 2021-09-29 DIAGNOSIS — R0789 Other chest pain: Secondary | ICD-10-CM | POA: Diagnosis not present

## 2021-09-29 DIAGNOSIS — Z79899 Other long term (current) drug therapy: Secondary | ICD-10-CM | POA: Diagnosis not present

## 2021-09-29 DIAGNOSIS — Z950 Presence of cardiac pacemaker: Secondary | ICD-10-CM | POA: Diagnosis not present

## 2021-09-29 DIAGNOSIS — K449 Diaphragmatic hernia without obstruction or gangrene: Secondary | ICD-10-CM | POA: Diagnosis not present

## 2021-09-29 DIAGNOSIS — I700 Atherosclerosis of aorta: Secondary | ICD-10-CM | POA: Diagnosis not present

## 2021-09-29 LAB — BASIC METABOLIC PANEL
Anion gap: 9 (ref 5–15)
BUN: 10 mg/dL (ref 8–23)
CO2: 23 mmol/L (ref 22–32)
Calcium: 9 mg/dL (ref 8.9–10.3)
Chloride: 104 mmol/L (ref 98–111)
Creatinine, Ser: 1.02 mg/dL (ref 0.61–1.24)
GFR, Estimated: >60 mL/min (ref >60)
Glucose, Bld: 97 mg/dL (ref 70–99)
Potassium: 4.3 mmol/L (ref 3.5–5.1)
Sodium: 136 mmol/L (ref 135–145)

## 2021-09-29 LAB — CBC
HCT: 34.1 % — ABNORMAL LOW (ref 39.0–52.0)
Hemoglobin: 10.5 g/dL — ABNORMAL LOW (ref 13.0–17.0)
MCH: 22.3 pg — ABNORMAL LOW (ref 26.0–34.0)
MCHC: 30.8 g/dL (ref 30.0–36.0)
MCV: 72.6 fL — ABNORMAL LOW (ref 80.0–100.0)
Platelets: 178 10*3/uL (ref 150–400)
RBC: 4.7 MIL/uL (ref 4.22–5.81)
RDW: 17.2 % — ABNORMAL HIGH (ref 11.5–15.5)
WBC: 5.4 10*3/uL (ref 4.0–10.5)
nRBC: 0 % (ref 0.0–0.2)

## 2021-09-29 LAB — TROPONIN I (HIGH SENSITIVITY): Troponin I (High Sensitivity): 8 ng/L (ref <18)

## 2021-09-29 NOTE — ED Provider Triage Note (Signed)
Emergency Medicine Provider Triage Evaluation Note  OCTAVIUS SHIN , a 81 y.o. male  was evaluated in triage.  Pt complains of left-sided chest pain onset this morning.  Denies shortness of breath, nausea, vomiting.  Patient has a pacemaker in place.  Also with stents placed as well.  Denies past medical history of MI.  Patient took 2 nitroglycerin this morning without relief of his symptoms.  Also took 2 tramadol with no relief of his symptoms.  Review of Systems  Positive: As per HPI above Negative:   Physical Exam  BP (!) 146/67 (BP Location: Right Arm)   Pulse 60   Temp 98.1 F (36.7 C) (Oral)   Resp 18   Ht '5\' 9"'$  (1.753 m)   Wt 107.5 kg   SpO2 94%   BMI 35.00 kg/m  Gen:   Awake, no distress   Resp:  Normal effort  MSK:   Moves extremities without difficulty  Other:  No chest wall tenderness to palpation  Medical Decision Making  Medically screening exam initiated at 5:13 PM.  Appropriate orders placed.  AREL TIPPEN was informed that the remainder of the evaluation will be completed by another provider, this initial triage assessment does not replace that evaluation, and the importance of remaining in the ED until their evaluation is complete.  5:14 PM - Discussed with RN that patient is in need of a room. RN aware and working on room placement.     Tyrann Donaho A, PA-C 09/29/21 1714

## 2021-09-29 NOTE — ED Triage Notes (Signed)
Pt reports left sided CP onset this morning at 8am while watching television. Denies SOB/nausea. He still reports pain. Reports pacemaker was placed last month to left chest.

## 2021-09-30 ENCOUNTER — Emergency Department (HOSPITAL_COMMUNITY): Payer: Medicare HMO

## 2021-09-30 DIAGNOSIS — R918 Other nonspecific abnormal finding of lung field: Secondary | ICD-10-CM | POA: Diagnosis not present

## 2021-09-30 DIAGNOSIS — K449 Diaphragmatic hernia without obstruction or gangrene: Secondary | ICD-10-CM | POA: Diagnosis not present

## 2021-09-30 DIAGNOSIS — I700 Atherosclerosis of aorta: Secondary | ICD-10-CM | POA: Diagnosis not present

## 2021-09-30 DIAGNOSIS — I2510 Atherosclerotic heart disease of native coronary artery without angina pectoris: Secondary | ICD-10-CM | POA: Diagnosis not present

## 2021-09-30 LAB — TROPONIN I (HIGH SENSITIVITY): Troponin I (High Sensitivity): 10 ng/L (ref <18)

## 2021-09-30 MED ORDER — IOHEXOL 350 MG/ML SOLN
75.0000 mL | Freq: Once | INTRAVENOUS | Status: AC | PRN
  Administered 2021-09-30: 75 mL via INTRAVENOUS

## 2021-09-30 NOTE — ED Provider Notes (Signed)
[facility] EMERGENCY DEPARTMENT Provider Note   CSN: [id number] Arrival date & time: 09/29/21  1634     History  Chief Complaint  Patient presents with   Chest Pain    Joel [name] is a 81 y.o. male.  The history is provided by the patient and medical records.  Chest Pain Joel [name] is a 81 y.o. male who presents to the Emergency Department complaining of chest pain.  He presents to the ED for evaluation of left sided chest pain that started at 8am Tuesday morning.  Pain comes and goes and is described as an aching pain.  Sometimes is short of breath with it but not consistently.  Episodes last about 15 minutes and then resolved.  No change with activity, breathing or nitroglycerin.  No diaphoresis, abdominal pain, N/V.  Hx/o PVD, CAD, COPD, MALT, prostate cancer, pacemaker    Home Medications Prior to Admission medications   Medication Sig Start Date End Date Taking? Authorizing Provider  abiraterone acetate (ZYTIGA) 250 MG tablet Take 1,000 mg by mouth daily.    [provider]  Accu-Chek FastClix Lancets MISC Test once daily 11/23/18   Henson, Vickie L, NP-C  albuterol (VENTOLIN HFA) 108 (90 Base) MCG/ACT inhaler INHALE 2 PUFFS INTO THE LUNGS EVERY 6 HOURS AS NEEDED FOR WHEEZING OR SHORTNESS OF BREATH Patient taking differently: 2 puffs every 6 (six) hours as needed for shortness of breath. 03/15/19   Henson, Vickie L, NP-C  amLODipine (NORVASC) 10 MG tablet TAKE 1 TABLET(10 MG) BY MOUTH DAILY 09/15/21   Burnell Blanks, MD  aspirin EC 81 MG tablet Take 81 mg by mouth daily.     [provider]  azithromycin (ZITHROMAX) 500 MG tablet Take 1 tablet (500 mg total) by mouth daily. 09/22/21   Denita Lung, MD  brimonidine (ALPHAGAN) 0.2 % ophthalmic solution Place 1 drop into both eyes 2 (two) times daily. 05/09/20   [provider]  carboxymethylcellul-glycerin (REFRESH RELIEVA) 0.5-0.9 % ophthalmic solution Place 1  drop into both eyes daily as needed for dry eyes.    [provider]  diclofenac Sodium (VOLTAREN) 1 % GEL Apply 2 g topically 2 (two) times daily as needed (back pain). 06/16/20   [provider]  docusate sodium (COLACE) 100 MG capsule Take 100 mg by mouth daily as needed for mild constipation.    [provider]  gabapentin (NEURONTIN) 600 MG tablet Take 600 mg by mouth 2 (two) times daily.    [provider]  isosorbide mononitrate (IMDUR) 120 MG 24 hr tablet Take 1 tablet (120 mg total) by mouth daily. Patient taking differently: Take 60 mg by mouth daily. 07/31/21   Irene Pap, PA-C  latanoprost (XALATAN) 0.005 % ophthalmic solution Place 1 drop into both eyes at bedtime. 02/28/20   [provider]  Loratadine 10 MG CAPS Take 10 mg by mouth daily as needed (allergies).    [provider]  loratadine-pseudoephedrine (CLARITIN-D 24-HOUR) 10-240 MG 24 hr tablet Take 1 tablet by mouth daily as needed for allergies.    [provider]  nitroGLYCERIN (NITROSTAT) 0.4 MG SL tablet Place 1 tablet (0.4 mg total) under the tongue every 5 (five) minutes as needed for chest pain (max 3 doses). Patient taking differently: Place 0.4 mg under the tongue every 5 (five) minutes as needed for chest pain. 10/20/20   Burnell Blanks, MD  omeprazole (PRILOSEC) 20 MG capsule Take 20 mg by mouth daily.  [provider]  pravastatin (PRAVACHOL) 40 MG tablet Take 1 tablet (40 mg total) by mouth every evening. 07/27/21   Burnell Blanks, MD  predniSONE (DELTASONE) 5 MG tablet Take 5 mg by mouth daily. 07/17/20   [provider]  ranolazine (RANEXA) 500 MG 12 hr tablet Take 1 tablet (500 mg total) by mouth 2 (two) times daily. Patient not taking: Reported on 08/31/2021 06/28/21   Patrecia Pour, MD  tamsulosin (FLOMAX) 0.4 MG CAPS capsule Take 0.4 mg by mouth at bedtime.    [provider]  traMADol (ULTRAM) 50 MG  tablet Take 1 tablet (50 mg total) by mouth every 12 (twelve) hours as needed. 07/30/21   Irene Pap, PA-C      Allergies    Topiramate, Atenolol, Lisinopril, and Tramadol-acetaminophen    Review of Systems   Review of Systems  Cardiovascular:  Positive for chest pain.  All other systems reviewed and are negative.   Physical Exam Updated Vital Signs BP (!) 147/86   Pulse 63   Temp 97.7 F (36.5 C) (Oral)   Resp 12   Ht '5\' 9"'$  (1.753 m)   Wt 107.5 kg   SpO2 98%   BMI 35.00 kg/m  Physical Exam Vitals and nursing note reviewed.  Constitutional:      Appearance: He is well-developed.  HENT:     Head: Normocephalic and atraumatic.  Cardiovascular:     Rate and Rhythm: Normal rate and regular rhythm.     Heart sounds: No murmur heard. Pulmonary:     Effort: Pulmonary effort is normal. No respiratory distress.     Breath sounds: Normal breath sounds.  Abdominal:     Palpations: Abdomen is soft.     Tenderness: There is no abdominal tenderness. There is no guarding or rebound.  Musculoskeletal:        General: No swelling or tenderness.  Skin:    General: Skin is warm and dry.  Neurological:     Mental Status: He is alert and oriented to person, place, and time.  Psychiatric:        Behavior: Behavior normal.     ED Results / Procedures / Treatments   Labs (all labs ordered are listed, but only abnormal results are displayed) Labs Reviewed  CBC - Abnormal; Notable for the following components:      Result Value   Hemoglobin 10.5 (*)    HCT 34.1 (*)    MCV 72.6 (*)    MCH 22.3 (*)    RDW 17.2 (*)    All other components within normal limits  BASIC METABOLIC PANEL  TROPONIN I (HIGH SENSITIVITY)  TROPONIN I (HIGH SENSITIVITY)    EKG EKG Interpretation  Date/Time:  Tuesday September 29 2021 16:43:21 EDT Ventricular Rate:  67 PR Interval:  184 QRS Duration: 130 QT Interval:  434 QTC Calculation: 458 R Axis:   21 Text Interpretation: AV dual-paced rhythm  Abnormal ECG Confirmed by Quintella Reichert [phone]) on 09/30/2021 2:58:46 AM  Radiology CT Angio Chest PE W/Cm &/Or Wo Cm  Result Date: 09/30/2021 CLINICAL DATA:  Left-sided chest pain. Pulmonary embolism suspected. EXAM: CT ANGIOGRAPHY CHEST WITH CONTRAST TECHNIQUE: Multidetector CT imaging of the chest was performed using the standard protocol during bolus administration of intravenous contrast. Multiplanar CT image reconstructions and MIPs were obtained to evaluate the vascular anatomy. RADIATION DOSE REDUCTION: This exam was performed according to the departmental dose-optimization program which includes automated exposure control, adjustment of the mA and/or  kV according to patient size and/or use of iterative reconstruction technique. CONTRAST:  25m OMNIPAQUE IOHEXOL 350 MG/ML SOLN COMPARISON:  05/15/2020 chest CT. FINDINGS: Cardiovascular: The heart size is normal. No substantial pericardial effusion. Coronary artery calcification is evident. Moderate atherosclerotic calcification is noted in the wall of the thoracic aorta. Ascending thoracic aorta measures 3.9 cm diameter. There is no filling defect within the opacified pulmonary arteries to suggest the presence of an acute pulmonary embolus. Left-sided permanent pacemaker. Mediastinum/Nodes: No mediastinal lymphadenopathy. There is no hilar lymphadenopathy. The esophagus has normal imaging features. Tiny hiatal hernia noted. There is no axillary lymphadenopathy. Lungs/Pleura: Dependent ground-glass and consolidative opacity in both lower lobes may be atelectatic although pneumonia cannot be excluded. 2 mm anterior right lung perifissural nodule on 56/6 is stable. 3 mm subpleural right middle lobe nodule on 73/6 is unchanged. 4 mm left upper lobe nodule on 39/6 is similar to prior. No pleural effusion. Upper Abdomen: Multiple hepatic cysts again noted. Additional tiny hypoattenuating liver lesions are too small to characterize. Musculoskeletal: No  worrisome lytic or sclerotic osseous abnormality. Review of the MIP images confirms the above findings. IMPRESSION: 1. No CT evidence for acute pulmonary embolus. 2. Dependent ground-glass and consolidative opacity in both lower lobes may be atelectatic although pneumonia cannot be excluded. 3. Stable, scattered tiny bilateral pulmonary nodules measuring up to 4 mm. No follow-up needed if patient is low-risk (and has no known or suspected primary neoplasm). Non-contrast chest CT can be considered in 12 months if patient is high-risk. This recommendation follows the consensus statement: Guidelines for Management of Incidental Pulmonary Nodules Detected on CT Images: From the Fleischner Society 2017; Radiology 2017; 284:228-243. 4. Aortic Atherosclerosis (ICD10-I70.0). Electronically Signed   By: EMisty StanleyM.D.   On: 09/30/2021 05:20   DG Chest 2 View  Result Date: 09/29/2021 CLINICAL DATA:  Pacemaker EXAM: CHEST - 2 VIEW COMPARISON:  None Available. FINDINGS: Normal mediastinum and cardiac silhouette. LEFT-sided pacemaker with 2 continuous leads. Normal pulmonary vasculature. No evidence of effusion, infiltrate, or pneumothorax. No acute bony abnormality. IMPRESSION: No acute cardiopulmonary process. Electronically Signed   By: SSuzy BouchardM.D.   On: 09/29/2021 17:32    Procedures Procedures    Medications Ordered in ED Medications  iohexol (OMNIPAQUE) 350 MG/ML injection 75 mL (75 mLs Intravenous Contrast Given 09/30/21 0453)    ED Course/ Medical Decision Making/ A&P                           Medical Decision Making Amount and/or Complexity of Data Reviewed Labs: ordered. Radiology: ordered.  Risk Prescription drug management.   Pt with hx/o CAD, HTN, DVT, MALT, pacemaker here for evaluation of waxing and waning chest pain.  Troponin neg x 2.  EKG with paced rhythm. Pain not reproducible, does not improve with nitroglycerin.  Given hx/o DVT and CTA PE study was obtained, which is  neg for PE.  CT with ateletasis vs pna - pt with URI sxs over one week ago - now resolved.  Current clinical picture is not c/w pneumonia.  Feel pt is stable for d/c home with PCP, cards follow up.  Discussed OTC analgesic such as tylenol if needed, return precautions.         Final Clinical Impression(s) / ED Diagnoses Final diagnoses:  Atypical chest pain    Rx / DC Orders ED Discharge Orders     None  Quintella Reichert, MD 09/30/21 [phone]

## 2021-10-01 ENCOUNTER — Telehealth: Payer: Self-pay

## 2021-10-01 NOTE — Addendum Note (Signed)
Addended by: Freada Bergeron on: 10/01/2021 05:26 PM   Modules accepted: Orders

## 2021-10-04 NOTE — Progress Notes (Signed)
Cardiology Office Note:    Date:  10/05/2021   ID:  Joel [name], DOB 1940/12/21, MRN [id number]  PCP:  Lexine Baton   [facility] Providers Cardiologist:  Verne Carrow, MD     Referring MD: Jake Shark, [place]   Chief Complaint: Preoperative cardiac evaluation  History of Present Illness:    Joel [name] is a very pleasant  81 y.o. male with a hx of CHB s/p pacemaker implant 09/03/2021, CAD, hypertension, PAD, COPD, iron deficiency anemia, diabetes, CKD, MALT of orbit, OSA, and prostate cancer.  History of coronary cath February 2014 showing severe OM 2 stenosis treated with a BMS.  Since that time he has had chronic angina with multiple ER visits.  Repeat heart cath 07/2013 with patent stent and mild to moderate disease in the LAD and RCA.  Nuclear stress test 02/2017 with inferior defect consistent with ischemia and possible soft tissue attenuation.  Continue to have multiple ER visits for chest pain.  Heart cath 07/2019 with moderate diffuse CAD but no focally obstructive lesions except in the PDA which was too small for stenting.  History of left pop-tibial bypass 2011. Has a small saccular aneurysm at the level of his left renal artery above the endovascular graft that was done in [facility] 07/2009.  Request for preoperative cardiovascular risk assessment for possible upcoming abdominal aortic aneurysm surgery with [facility].  In anticipation of the surgery nuclear stress test was ordered and performed on 04/17/2021 which showed normal LV perfusion and no evidence of ischemia.   Admission 3/17-3/19/23 for intermittent chest pain x1 day with no association with eating.  EKG with HR in the 40s and second-degree type I HB.  He denied dizziness and lightheadedness, no syncope. Hs troponin x 2 negative cardiology was asked to consult.  He was seen by Dr. Ladona Ridgel in follow-up and advised to wear a 14-day cardiac monitor. Cardiac monitor 07/2021 revealed 98 episodes of  possible CHB total 29 minutes 44 seconds.  He had 1 run of NSVT 17 beats. He was last seen in our office on 08/04/2021 by Dr. Ladona Ridgel and underwent PPM implant 09/03/21. A sleep study was also conducted 09/02/21.   ER visit 09/30/21 for chest pain. Troponin negative x 2. CT negative for PE, no pna. He is scheduled for lumbar epidural steroid injection with dexamethasone on 10/12/2021.  Today, he is here for follow-up from ER visit for chest pain. He thinks he may have picked up something too heavy with his left arm.  Occasional twinges of chest discomfort that do not persist.  No shortness of breath or chest pain with exertion. No palpitations. Remains active doing house work and tinkering on automobiles. Stiffness in the mornings when he wakes up. Recent sleep test revealed severe OSA, needs bipap titration. Weight is stable. Has chronic LLE swelling that is stable.    Past Medical History:  Diagnosis Date   Atypical chest pain    CAD (coronary artery disease)    a. Multiple prior evaluations then 05/2012 s/p BMS to OM2. b. Nuc 11/2012 wnl. c. Cath 2015: patent stent, moderate LAD/RCA dz, treated medically.   Cataract    CHEST PAIN UNSPECIFIED 09/13/2008   Qualifier: Diagnosis of  By: Trevor Iha, RN, Heather     CKD (chronic kidney disease), stage II    COPD (chronic obstructive pulmonary disease) (HCC)    Cough secondary to angiotensin converting enzyme inhibitor (ACE-I)    Diabetes mellitus (HCC) 09/07/2019   Diabetes mellitus  without complication (HCC)    Diverticulosis    DVT (deep venous thrombosis) (HCC)    Family history of breast cancer in first degree relative    GERD (gastroesophageal reflux disease)    Headache(784.0)    Hx: of   History of blood transfusion    "I've had 2; don't know what it was related to" (11/09/2012)   HLD (hyperlipidemia)    HTN (hypertension)    Iron deficiency anemia    Lymphoproliferative disorder, low grade B cell (HCC) 05/21/2020   Microcytic anemia    Peripheral  vascular disease (HCC)    a. L pop-tibial bypass 2011, followed by [facility].   Personal history of prostate cancer    Rotator cuff injury    "left arm; never repaired" (11/09/2012)    Past Surgical History:  Procedure Laterality Date   CARDIOVASCULAR STRESS TEST  Aug. 2014   CATARACT EXTRACTION W/ INTRAOCULAR LENS IMPLANT Right ~ 2008   CIRCUMCISION     COLONOSCOPY     CORONARY ANGIOPLASTY WITH STENT PLACEMENT  2014   "1" (11/09/2012)   ILIAC ARTERY ANEURYSM REPAIR     LEFT HEART CATH  05-31-12   LEFT HEART CATH AND CORONARY ANGIOGRAPHY N/A 08/01/2019   Procedure: LEFT HEART CATH AND CORONARY ANGIOGRAPHY;  Surgeon: Kathleene Hazel, MD;  Location: [facility] INVASIVE CV LAB;  Service: Cardiovascular;  Laterality: N/A;   LEFT HEART CATHETERIZATION WITH CORONARY ANGIOGRAM N/A 06/01/2011   Procedure: LEFT HEART CATHETERIZATION WITH CORONARY ANGIOGRAM;  Surgeon: Herby Abraham, MD;  Location: [facility] CATH LAB;  Service: Cardiovascular;  Laterality: N/A;   LEFT HEART CATHETERIZATION WITH CORONARY ANGIOGRAM N/A 05/31/2012   Procedure: LEFT HEART CATHETERIZATION WITH CORONARY ANGIOGRAM;  Surgeon: Peter M Swaziland, MD;  Location: [facility] CATH LAB;  Service: Cardiovascular;  Laterality: N/A;   LEFT HEART CATHETERIZATION WITH CORONARY ANGIOGRAM N/A 08/01/2013   Procedure: LEFT HEART CATHETERIZATION WITH CORONARY ANGIOGRAM;  Surgeon: Kathleene Hazel, MD;  Location: [facility] CATH LAB;  Service: Cardiovascular;  Laterality: N/A;   PACEMAKER IMPLANT N/A 09/03/2021   Procedure: PACEMAKER IMPLANT;  Surgeon: Marinus Maw, MD;  Location: [facility]VAS[facility] CV LAB;  Service: Cardiovascular;  Laterality: N/A;   PERCUTANEOUS CORONARY STENT INTERVENTION (PCI-S)  05/31/2012   Procedure: PERCUTANEOUS CORONARY STENT INTERVENTION (PCI-S);  Surgeon: Peter M Swaziland, MD;  Location: [facility] CATH LAB;  Service: Cardiovascular;;   PR VEIN BYPASS GRAFT,AORTO-FEM-POP Left 10/01/2009   left below knee popliteal artery to posterior tibial artery    SHOULDER ARTHROSCOPY WITH OPEN ROTATOR CUFF REPAIR AND DISTAL CLAVICLE ACROMINECTOMY Left 01/12/2013   Procedure: LEFT SHOULDER ARTHROSCOPY WITH DEBRIDEMENT OPEN DISTAL CLAVICLE RESECTION ,acromioplastyAND ROTATOR CUFF REPAIR;  Surgeon: Thera Flake., MD;  Location: [facility] OR;  Service: Orthopedics;  Laterality: Left;    Current Medications: Current Meds  Medication Sig   abiraterone acetate (ZYTIGA) 250 MG tablet Take 1,000 mg by mouth daily.   Accu-Chek FastClix Lancets MISC Test once daily   albuterol (VENTOLIN HFA) 108 (90 Base) MCG/ACT inhaler INHALE 2 PUFFS INTO THE LUNGS EVERY 6 HOURS AS NEEDED FOR WHEEZING OR SHORTNESS OF BREATH   amLODipine (NORVASC) 10 MG tablet TAKE 1 TABLET(10 MG) BY MOUTH DAILY   aspirin EC 81 MG tablet Take 81 mg by mouth daily.    brimonidine (ALPHAGAN) 0.2 % ophthalmic solution Place 1 drop into both eyes 2 (two) times daily.   carboxymethylcellul-glycerin (REFRESH RELIEVA) 0.5-0.9 % ophthalmic solution Place 1 drop into both eyes daily as needed for dry eyes.  diclofenac Sodium (VOLTAREN) 1 % GEL Apply 2 g topically 2 (two) times daily as needed (back pain).   docusate sodium (COLACE) 100 MG capsule Take 100 mg by mouth daily as needed for mild constipation.   gabapentin (NEURONTIN) 600 MG tablet Take 600 mg by mouth 2 (two) times daily.   isosorbide mononitrate (IMDUR) 120 MG 24 hr tablet Take 1 tablet (120 mg total) by mouth daily.   latanoprost (XALATAN) 0.005 % ophthalmic solution Place 1 drop into both eyes at bedtime.   loratadine-pseudoephedrine (CLARITIN-D 24-HOUR) 10-240 MG 24 hr tablet Take 1 tablet by mouth daily as needed for allergies.   omeprazole (PRILOSEC) 20 MG capsule Take 20 mg by mouth daily.   pravastatin (PRAVACHOL) 40 MG tablet Take 1 tablet (40 mg total) by mouth every evening.   predniSONE (DELTASONE) 5 MG tablet Take 5 mg by mouth daily.   tamsulosin (FLOMAX) 0.4 MG CAPS capsule Take 0.4 mg by mouth at bedtime.   traMADol (ULTRAM) 50  MG tablet Take 1 tablet (50 mg total) by mouth every 12 (twelve) hours as needed.   [DISCONTINUED] nitroGLYCERIN (NITROSTAT) 0.4 MG SL tablet Place 1 tablet (0.4 mg total) under the tongue every 5 (five) minutes as needed for chest pain (max 3 doses).     Allergies:   Topiramate, Atenolol, Lisinopril, and Tramadol-acetaminophen   Social History   Socioeconomic History   Marital status: Widowed    Spouse name: Not on file   Number of children: Not on file   Years of education: Not on file   Highest education level: 12th grade  Occupational History   Occupation: Retired  Tobacco Use   Smoking status: Former    Packs/day: 0.50    Years: 52.00    Total pack years: 26.00    Types: Cigarettes    Quit date: 05/26/2011    Years since quitting: 10.3    Passive exposure: Past   Smokeless tobacco: Never  Vaping Use   Vaping Use: Never used  Substance and Sexual Activity   Alcohol use: No   Drug use: No   Sexual activity: Not on file  Other Topics Concern   Not on file  Social History Narrative   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  Lives in [place] by himself.      Patient writes with his right hand, though uses his left hand for every thing else. He lives alone in a one level home. He drinks 3 cups of coffee a day and 3 7-8 oz sodas a day. He does not exercise.   Social Determinants of Health   Financial Resource Strain: Low Risk  (06/11/2021)   Overall Financial Resource Strain (CARDIA)    Difficulty of Paying Living Expenses: Not hard at all  Food Insecurity: No Food Insecurity (05/29/2021)   Hunger Vital Sign    Worried About Running Out of Food in the Last Year: Never true    Ran Out of Food in the Last Year: Never true  Transportation Needs: No Transportation Needs (06/11/2021)   PRAPARE - Administrator, Civil Service (Medical): No    Lack of Transportation (Non-Medical): No  Physical Activity: Inactive (05/29/2021)   Exercise Vital Sign    Days of  Exercise per Week: 0 days    Minutes of Exercise per Session: 0 min  Stress: No Stress Concern Present (05/29/2021)   [employer] of Occupational Health - Occupational Stress Questionnaire    Feeling of Stress : Not  at all  Social Connections: Not on file     Family History: The patient's family history includes Breast cancer in his mother; COPD in his father; Cancer (age of onset: 27) in his brother; Cancer (age of onset: 20) in his brother; Hyperlipidemia in his father and mother; Hypertension in his brother, daughter, father, mother, sister, and son. There is no history of Colon cancer.  ROS:   Please see the history of present illness.   All other systems reviewed and are negative.  Labs/Other Studies Reviewed:    The following studies were reviewed today:  CTA 09/30/21  IMPRESSION: 1. No CT evidence for acute pulmonary embolus. 2. Dependent ground-glass and consolidative opacity in both lower lobes may be atelectatic although pneumonia cannot be excluded. 3. Stable, scattered tiny bilateral pulmonary nodules measuring up to 4 mm. No follow-up needed if patient is low-risk (and has no known or suspected primary neoplasm). Non-contrast chest CT can be considered in 12 months if patient is high-risk. This recommendation follows the consensus statement: Guidelines for Management of Incidental Pulmonary Nodules Detected on CT Images: From the Fleischner Society 2017; Radiology 2017; 284:228-243. 4. Aortic Atherosclerosis (ICD10-I70.0).  Cardiac monitor 07/29/21  1. NSR with sinus brady and sinus tachycardia 2. Nocturnal/Early morning transient CHB as well as high 2:1 AV block with pauses of up to 3.5 seconds 3. Occasional PVC's 4.  Brief NSVT as noted below. 5. No atrial fib  Lexiscan 04/17/21    The study is normal. The study is low risk.   No ST deviation was noted.   LV perfusion is normal. There is no evidence of ischemia. There is no evidence of infarction.   Left  ventricular function is normal. Nuclear stress EF: 61 %. The left ventricular ejection fraction is normal (55-65%). End diastolic cavity size is normal. End systolic cavity size is normal.   Prior study available for comparison from 03/10/2017.   Normal resting and stress perfusion. No ischemia or infarction EF 61%  Echo 08/27/20   1. Left ventricular ejection fraction, by estimation, is 65%. The left  ventricle has normal function. The left ventricle has no regional wall  motion abnormalities. Left ventricular diastolic parameters are consistent  with Grade I diastolic dysfunction  (impaired relaxation).   2. Right ventricular systolic function is normal. The right ventricular  size is normal.   3. The mitral valve is grossly normal. No evidence of mitral valve  regurgitation. No evidence of mitral stenosis.   4. The aortic valve was not well visualized. Aortic valve regurgitation  is trivial. No aortic stenosis is present.   5. Aortic dilatation noted. There is mild dilatation of the aortic root,  measuring 41 mm. There is mild dilatation of the ascending aorta,  measuring 40 mm. This is within normal limits for age and BSA.   Comparison(s): No prior Echocardiogram.   LHC 08/01/19  Prox RCA lesion is 40% stenosed. Mid RCA lesion is 40% stenosed. Mid RCA to Dist RCA lesion is 50% stenosed. RPDA lesion is 70% stenosed. 3rd RPL lesion is 70% stenosed. Prox Cx to Dist Cx lesion is 40% stenosed. Prox LAD to Mid LAD lesion is 40% stenosed. Mid LAD lesion is 40% stenosed. Dist LAD lesion is 70% stenosed. The left ventricular systolic function is normal. LV end diastolic pressure is normal. The left ventricular ejection fraction is 55-65% by visual estimate. There is no mitral valve regurgitation.   1. Moderate non-obstructive disease in the LAD and Circumflex 2. Moderate non-obstructive  disease in the RCA. Severe disease in the small caliber PDA, too small for PCI 3. Normal LV  systolic function   Recommendations: Continue medical management of CAD    Recent Labs: 06/26/2021: ALT 11; B Natriuretic Peptide 64.3; Magnesium 1.9 09/29/2021: BUN 10; Creatinine, Ser 1.02; Hemoglobin 10.5; Platelets 178; Potassium 4.3; Sodium 136  Recent Lipid Panel    Component Value Date/Time   CHOL 147 06/15/2021 1211   TRIG 268 (H) 06/15/2021 1211   HDL 37 (L) 06/15/2021 1211   CHOLHDL 4.0 06/15/2021 1211   CHOLHDL 5 02/06/2013 0741   VLDL 24.4 02/06/2013 0741   LDLCALC 67 06/15/2021 1211     Risk Assessment/Calculations:      Physical Exam:    VS:  BP (!) 110/58   Pulse (!) 59   Ht 5\' 9"  (1.753 m)   Wt 236 lb (107 kg)   SpO2 95%   BMI 34.85 kg/m     Wt Readings from Last 3 Encounters:  10/05/21 236 lb (107 kg)  09/29/21 237 lb (107.5 kg)  09/22/21 237 lb 12.8 oz (107.9 kg)     GEN: Well nourished obese male in no acute distress HEENT: Normal NECK: No JVD; No carotid bruits CARDIAC: RRR, no murmurs, rubs, gallops RESPIRATORY:  Clear to auscultation without rales, wheezing or rhonchi  ABDOMEN: Soft, non-tender, non-distended MUSCULOSKELETAL:  LLE edema; No deformity. 2+ pedal pulses, equal bilaterally SKIN: Warm and dry. Left chest pacemaker site well-approximated without s/s infection, swelling NEUROLOGIC:  Alert and oriented x 3 PSYCHIATRIC:  Normal affect   EKG:  EKG is not ordered today.   Diagnoses:    1. Preoperative cardiovascular examination   2. OSA (obstructive sleep apnea)   3. Complete heart block (HCC)   4. Pacemaker   5. Coronary artery disease involving native coronary artery of native heart without angina pectoris    Assessment and Plan:     Preoperative cardiac evaluation: He has upcoming ESI.  He is doing well from a cardiac perspective and may proceed without further testing. He may hold aspirin x 7 days and resume as soon as hemodynamically stable. According to the Revised Cardiac Risk Index (RCRI), his Perioperative Risk of  Major Cardiac Event is (%): 0.4. His Functional Capacity in METs is: 6.05 according to the Duke Activity Status Index (DASI).  I will forward clearance to requesting provider's office.  CHB s/p PPM implant: He is post pacemaker implant 09/03/2021. Had an episode of chest discomfort and went to ER on 6/21. Thinks he may have picked up something too heavy.  No further episodes of chest pain. Pacemaker site is well-healed. Advised him to increase activity with left arm gradually. Has follow-up with Dr. Ladona Ridgel scheduled for 9/7. Management per EP.  CAD without angina: Most recent cath April 2021 with stable CAD, severe stenosis and small PDA too small for stenting. Otherwise mild to moderate disease in LAD, circumflex, RCA. Low risk nuclear stress test 04/2021.  He denies chest pain, dyspnea, or other symptoms concerning for angina.  No indication for further ischemic evaluation at this time.  Request refill of his nitroglycerin because his are very old.  Hypertension: BP is well controlled. He does not monitor on a consistent basis.  Continue amlodipine, Imdur.  Hyperlipidemia LDL goal < 70: LDL 67 on 06/15/2021.  Continue pravastatin.  Disposition: 6 months with Dr. Laureen Abrahams your September appointment with Dr. Ladona Ridgel  Medication Adjustments/Labs and Tests Ordered: Current medicines are reviewed at length with the patient  today.  Concerns regarding medicines are outlined above.  No orders of the defined types were placed in this encounter.  Meds ordered this encounter  Medications   nitroGLYCERIN (NITROSTAT) 0.4 MG SL tablet    Sig: Place 1 tablet (0.4 mg total) under the tongue every 5 (five) minutes as needed for chest pain (max 3 doses).    Dispense:  25 tablet    Refill:  3    Patient Instructions  Medication Instructions:   Your physician recommends that you continue on your current medications as directed. Please refer to the Current Medication list given to you today.   *If you  need a refill on your cardiac medications before your next appointment, please call your pharmacy*   Lab Work:  None ordered.  If you have labs (blood work) drawn today and your tests are completely normal, you will receive your results only by: MyChart Message (if you have MyChart) OR A paper copy in the mail If you have any lab test that is abnormal or we need to change your treatment, we will call you to review the results.   Testing/Procedures:  None ordered.   Follow-Up: At [facility], you and your health needs are our priority.  As part of our continuing mission to provide you with exceptional heart care, we have created designated Provider Care Teams.  These Care Teams include your primary Cardiologist (physician) and Advanced Practice Providers (APPs -  Physician Assistants and Nurse Practitioners) who all work together to provide you with the care you need, when you need it.  We recommend signing up for the patient portal called "MyChart".  Sign up information is provided on this After Visit Summary.  MyChart is used to connect with patients for Virtual Visits (Telemedicine).  Patients are able to view lab/test results, encounter notes, upcoming appointments, etc.  Non-urgent messages can be sent to your provider as well.   To learn more about what you can do with MyChart, go to [email or web address].    Your next appointment:   6 month(s)  The format for your next appointment:   In Person  Provider:   Verne Carrow, MD     Important Information About Sugar         Signed, Levi Aland, NP  10/05/2021 10:47 AM    [facility]

## 2021-10-05 ENCOUNTER — Encounter: Payer: Self-pay | Admitting: Nurse Practitioner

## 2021-10-05 ENCOUNTER — Ambulatory Visit: Payer: Medicare HMO | Admitting: Cardiovascular Disease

## 2021-10-05 ENCOUNTER — Ambulatory Visit: Payer: Medicare HMO | Admitting: Nurse Practitioner

## 2021-10-05 VITALS — BP 110/58 | HR 59 | Ht 69.0 in | Wt 236.0 lb

## 2021-10-05 DIAGNOSIS — I2510 Atherosclerotic heart disease of native coronary artery without angina pectoris: Secondary | ICD-10-CM | POA: Diagnosis not present

## 2021-10-05 DIAGNOSIS — Z01810 Encounter for preprocedural cardiovascular examination: Secondary | ICD-10-CM | POA: Diagnosis not present

## 2021-10-05 DIAGNOSIS — G4733 Obstructive sleep apnea (adult) (pediatric): Secondary | ICD-10-CM | POA: Diagnosis not present

## 2021-10-05 DIAGNOSIS — I442 Atrioventricular block, complete: Secondary | ICD-10-CM

## 2021-10-05 DIAGNOSIS — Z950 Presence of cardiac pacemaker: Secondary | ICD-10-CM

## 2021-10-05 MED ORDER — NITROGLYCERIN 0.4 MG SL SUBL: 0.4000 mg | SUBLINGUAL_TABLET | SUBLINGUAL | 3 refills | Status: DC | PRN

## 2021-10-06 ENCOUNTER — Ambulatory Visit (INDEPENDENT_AMBULATORY_CARE_PROVIDER_SITE_OTHER): Payer: Medicare HMO | Admitting: Physician Assistant

## 2021-10-06 ENCOUNTER — Encounter: Payer: Self-pay | Admitting: Physician Assistant

## 2021-10-06 VITALS — BP 120/60 | HR 61 | Ht 69.0 in | Wt 240.0 lb

## 2021-10-06 DIAGNOSIS — J439 Emphysema, unspecified: Secondary | ICD-10-CM | POA: Diagnosis not present

## 2021-10-06 DIAGNOSIS — R059 Cough, unspecified: Secondary | ICD-10-CM

## 2021-10-06 DIAGNOSIS — I10 Essential (primary) hypertension: Secondary | ICD-10-CM

## 2021-10-06 NOTE — Assessment & Plan Note (Signed)
Referral to Pulmonology for further evaluation

## 2021-10-08 NOTE — Telephone Encounter (Signed)
Prior Authorization for BIPAP TITRATION sent to [facility] via web portal. Tracking Number Procedure Tracking #: [id number] IS NOT AUTHORIZED - CASE REQUIRES CLINICAL REVIEW.

## 2021-10-12 DIAGNOSIS — M5416 Radiculopathy, lumbar region: Secondary | ICD-10-CM | POA: Diagnosis not present

## 2021-10-12 NOTE — Telephone Encounter (Signed)
Prior Authorization for BIPAP TITRATION sent to [facility] via web portal APPROVED-[place] READ-AUTH# [id number]  VALID DATES 11/13/21--12/13/21

## 2021-10-26 DIAGNOSIS — T1512XA Foreign body in conjunctival sac, left eye, initial encounter: Secondary | ICD-10-CM | POA: Diagnosis not present

## 2021-10-26 DIAGNOSIS — H16102 Unspecified superficial keratitis, left eye: Secondary | ICD-10-CM | POA: Diagnosis not present

## 2021-10-30 ENCOUNTER — Ambulatory Visit: Payer: Medicare HMO | Admitting: Medical

## 2021-11-06 DIAGNOSIS — H401231 Low-tension glaucoma, bilateral, mild stage: Secondary | ICD-10-CM | POA: Diagnosis not present

## 2021-11-06 DIAGNOSIS — Z0100 Encounter for examination of eyes and vision without abnormal findings: Secondary | ICD-10-CM | POA: Diagnosis not present

## 2021-11-06 DIAGNOSIS — H0102B Squamous blepharitis left eye, upper and lower eyelids: Secondary | ICD-10-CM | POA: Diagnosis not present

## 2021-11-06 DIAGNOSIS — H0102A Squamous blepharitis right eye, upper and lower eyelids: Secondary | ICD-10-CM | POA: Diagnosis not present

## 2021-11-06 DIAGNOSIS — Z961 Presence of intraocular lens: Secondary | ICD-10-CM | POA: Diagnosis not present

## 2021-11-06 DIAGNOSIS — H43811 Vitreous degeneration, right eye: Secondary | ICD-10-CM | POA: Diagnosis not present

## 2021-11-06 DIAGNOSIS — C6982 Malignant neoplasm of overlapping sites of left eye and adnexa: Secondary | ICD-10-CM | POA: Diagnosis not present

## 2021-11-06 DIAGNOSIS — H524 Presbyopia: Secondary | ICD-10-CM | POA: Diagnosis not present

## 2021-11-06 DIAGNOSIS — H35033 Hypertensive retinopathy, bilateral: Secondary | ICD-10-CM | POA: Diagnosis not present

## 2021-11-06 LAB — HM DIABETES EYE EXAM

## 2021-11-09 ENCOUNTER — Other Ambulatory Visit: Payer: Self-pay | Admitting: Cardiovascular Disease

## 2021-11-09 DIAGNOSIS — M5416 Radiculopathy, lumbar region: Secondary | ICD-10-CM | POA: Diagnosis not present

## 2021-11-10 DIAGNOSIS — M542 Cervicalgia: Secondary | ICD-10-CM | POA: Diagnosis not present

## 2021-11-10 DIAGNOSIS — G518 Other disorders of facial nerve: Secondary | ICD-10-CM | POA: Diagnosis not present

## 2021-11-10 DIAGNOSIS — R519 Headache, unspecified: Secondary | ICD-10-CM | POA: Diagnosis not present

## 2021-11-10 DIAGNOSIS — M791 Myalgia, unspecified site: Secondary | ICD-10-CM | POA: Diagnosis not present

## 2021-11-13 ENCOUNTER — Ambulatory Visit (HOSPITAL_COMMUNITY)
Admission: EM | Admit: 2021-11-13 | Discharge: 2021-11-13 | Disposition: A | Discharge status: Home / Self Care | Payer: Medicare HMO | Attending: Family Medicine | Admitting: Family Medicine

## 2021-11-13 ENCOUNTER — Ambulatory Visit (HOSPITAL_BASED_OUTPATIENT_CLINIC_OR_DEPARTMENT_OTHER): Payer: Medicare HMO | Admitting: Cardiology

## 2021-11-13 DIAGNOSIS — R531 Weakness: Secondary | ICD-10-CM | POA: Diagnosis not present

## 2021-11-13 DIAGNOSIS — Z950 Presence of cardiac pacemaker: Secondary | ICD-10-CM | POA: Diagnosis not present

## 2021-11-13 DIAGNOSIS — Z20822 Contact with and (suspected) exposure to covid-19: Secondary | ICD-10-CM | POA: Diagnosis not present

## 2021-11-13 DIAGNOSIS — R42 Dizziness and giddiness: Secondary | ICD-10-CM | POA: Diagnosis not present

## 2021-11-13 LAB — CBC WITH DIFFERENTIAL/PLATELET
Abs Immature Granulocytes: 0.11 10*3/uL — ABNORMAL HIGH (ref 0.00–0.07)
Basophils Absolute: 0 10*3/uL (ref 0.0–0.1)
Basophils Relative: 1 %
Eosinophils Absolute: 0.3 10*3/uL (ref 0.0–0.5)
Eosinophils Relative: 5 %
HCT: 36.1 % — ABNORMAL LOW (ref 39.0–52.0)
Hemoglobin: 11 g/dL — ABNORMAL LOW (ref 13.0–17.0)
Immature Granulocytes: 2 %
Lymphocytes Relative: 16 %
Lymphs Abs: 0.9 10*3/uL (ref 0.7–4.0)
MCH: 21.9 pg — ABNORMAL LOW (ref 26.0–34.0)
MCHC: 30.5 g/dL (ref 30.0–36.0)
MCV: 71.8 fL — ABNORMAL LOW (ref 80.0–100.0)
Monocytes Absolute: 0.5 10*3/uL (ref 0.1–1.0)
Monocytes Relative: 9 %
Neutro Abs: 3.9 10*3/uL (ref 1.7–7.7)
Neutrophils Relative %: 67 %
Platelets: 216 10*3/uL (ref 150–400)
RBC: 5.03 MIL/uL (ref 4.22–5.81)
RDW: 17.5 % — ABNORMAL HIGH (ref 11.5–15.5)
WBC: 5.8 10*3/uL (ref 4.0–10.5)
nRBC: 0 % (ref 0.0–0.2)

## 2021-11-13 LAB — COMPREHENSIVE METABOLIC PANEL
ALT: 13 U/L (ref 0–44)
AST: 16 U/L (ref 15–41)
Albumin: 3.6 g/dL (ref 3.5–5.0)
Alkaline Phosphatase: 81 U/L (ref 38–126)
Anion gap: 10 (ref 5–15)
BUN: 12 mg/dL (ref 8–23)
CO2: 26 mmol/L (ref 22–32)
Calcium: 9.4 mg/dL (ref 8.9–10.3)
Chloride: 104 mmol/L (ref 98–111)
Creatinine, Ser: 1.07 mg/dL (ref 0.61–1.24)
GFR, Estimated: >60 mL/min (ref >60)
Glucose, Bld: 120 mg/dL — ABNORMAL HIGH (ref 70–99)
Potassium: 4 mmol/L (ref 3.5–5.1)
Sodium: 140 mmol/L (ref 135–145)
Total Bilirubin: 0.4 mg/dL (ref 0.3–1.2)
Total Protein: 6.4 g/dL — ABNORMAL LOW (ref 6.5–8.1)

## 2021-11-13 NOTE — ED Provider Notes (Addendum)
[facility]    CSN: [id number] Arrival date & time: 11/13/21  1333      History   Chief Complaint Chief Complaint  Patient presents with   Weakness   Dizziness    HPI Joel [name] is a 81 y.o. male.    Weakness Associated symptoms: dizziness   Dizziness Associated symptoms: weakness    Here for weakness that began this morning.  He has also been feeling wobbly or dizzy.  No spinning.  He states yesterday he felt good.  No recent vomiting or diarrhea.  No fever or chills or cough or shortness of breath.  No chest pain or pressure.  No palpitations.  No blood in the stool or urine and no dysuria.  No abdominal pain  He does have a history of Mobitz type I block  Past Medical History:  Diagnosis Date   Atypical chest pain    CAD (coronary artery disease)    a. Multiple prior evaluations then 05/2012 s/p BMS to OM2. b. Nuc 11/2012 wnl. c. Cath 2015: patent stent, moderate LAD/RCA dz, treated medically.   Cataract    CHEST PAIN UNSPECIFIED 09/13/2008   Qualifier: Diagnosis of  By: Trevor Iha, RN, Heather     CKD (chronic kidney disease), stage II    COPD (chronic obstructive pulmonary disease) (HCC)    Cough secondary to angiotensin converting enzyme inhibitor (ACE-I)    Diabetes mellitus (HCC) 09/07/2019   Diabetes mellitus without complication (HCC)    Diverticulosis    DVT (deep venous thrombosis) (HCC)    Family history of breast cancer in first degree relative    GERD (gastroesophageal reflux disease)    Headache(784.0)    Hx: of   History of blood transfusion    "I've had 2; don't know what it was related to" (11/09/2012)   HLD (hyperlipidemia)    HTN (hypertension)    Iron deficiency anemia    Lymphoproliferative disorder, low grade B cell (HCC) 05/21/2020   Microcytic anemia    Peripheral vascular disease (HCC)    a. L pop-tibial bypass 2011, followed by [facility].   Personal history of prostate cancer    Rotator cuff injury    "left arm; never repaired"  (11/09/2012)    Patient Active Problem List   Diagnosis Date Noted   Heart block 08/04/2021   AV block, Mobitz 1 06/26/2021   Constipation, unspecified 05/11/2021   Non-Hodgkin lymphoma, unspecified, unspecified site (HCC) 05/11/2021   Encounter for therapeutic drug level monitoring 05/11/2021   Alpha thalassaemia minor 12/22/2020   Benign hypertensive heart disease 12/22/2020   Contact with and (suspected) exposure to other environmental pollution 12/22/2020   Aortic atherosclerosis (HCC) 07/17/2020   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   Lymphoproliferative disorder, low grade B cell (HCC)-Stage 4 05/21/2020   Chest pain 02/10/2020   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   Carcinoma of prostate (HCC) 09/06/2019   Chest pain of uncertain etiology    Sleep related hypoxia 04/03/2019   Obesity (BMI 30-39.9) 04/03/2019   Snoring 04/03/2019   At risk for sleep apnea 01/10/2019   Daytime somnolence 01/10/2019   Controlled type 2 diabetes mellitus with hyperglycemia, without long-term current use of insulin (HCC) 12/11/2018   Hypoxic encephalopathy (HCC) 12/11/2018   Obesity hypoventilation syndrome (HCC) 12/11/2018   Chronic low back pain 09/11/2018   COPD (chronic obstructive pulmonary disease) (HCC)    Seasonal allergies 07/04/2018   Chronic obstructive pulmonary disease (HCC) 07/04/2018  Pulsatile tinnitus 02/17/2018   Dizziness 01/20/2018   Malaise 01/20/2018   Decreased breath sounds 01/20/2018   Needs flu shot 01/20/2018   Chronic nonintractable headache 12/07/2017   Back pain 11/01/2017   Radicular pain of lower extremity 11/01/2017   Prostate cancer ([place]) 11/01/2017   Family history of breast cancer in first degree relative    Personal history of prostate cancer    Adenomatous polyp of colon 04/12/2017   Elliptocytosis on peripheral blood smear ([place]) 09/13/2016   Prediabetes 03/22/2016   Anemia due to chronic blood loss  04/12/2014   PVD (peripheral vascular disease) with claudication ([place]) 11/15/2012   Chest pain, localized 11/10/2012   Hypokalemia 11/10/2012   Bradycardia 11/09/2012   Thrombocytopenia ([place]) 06/01/2012   Microcytic anemia    Atherosclerosis of native artery of extremity with intermittent claudication ([place]) 06/16/2011   Stable angina ([place]) 06/11/2011   CAD (coronary artery disease)    Chest pain, atypical 05/30/2011   Iron deficiency anemia 10/03/2009   DIVERTICULOSIS OF COLON 10/03/2009   WEIGHT LOSS, ABNORMAL 10/03/2009   Headache(784.0) 08/19/2008   TIA 06/05/2008   SHOULDER PAIN, LEFT 10/11/2007   COUGH DUE TO ACE INHIBITORS 05/07/2007   History of tobacco use disorder 04/25/2007   Hyperlipidemia 04/21/2007   Essential hypertension 04/21/2007   GERD 04/21/2007    Past Surgical History:  Procedure Laterality Date   CARDIOVASCULAR STRESS TEST  Aug. 2014   CATARACT EXTRACTION W/ INTRAOCULAR LENS IMPLANT Right ~ 2008   CIRCUMCISION     COLONOSCOPY     CORONARY ANGIOPLASTY WITH STENT PLACEMENT  2014   "1" (11/09/2012)   ILIAC ARTERY ANEURYSM REPAIR     LEFT HEART CATH  05-31-12   LEFT HEART CATH AND CORONARY ANGIOGRAPHY N/A 08/01/2019   Procedure: LEFT HEART CATH AND CORONARY ANGIOGRAPHY;  Surgeon: Burnell Blanks, MD;  Location: [facility];  Service: Cardiovascular;  Laterality: N/A;   LEFT HEART CATHETERIZATION WITH CORONARY ANGIOGRAM N/A 06/01/2011   Procedure: LEFT HEART CATHETERIZATION WITH CORONARY ANGIOGRAM;  Surgeon: Hillary Bow, MD;  Location[facility] CATH LAB;  Service: Cardiovascular;  Laterality: N/A;   LEFT HEART CATHETERIZATION WITH CORONARY ANGIOGRAM N/A 05/31/2012   Procedure: LEFT HEART CATHETERIZATION WITH CORONARY ANGIOGRAM;  Surgeon: Peter M Martinique, MD;  Location: [facility] CATH LAB;  Service: Cardiovascular;  Laterality: N/A;   LEFT HEART CATHETERIZATION WITH CORONARY ANGIOGRAM N/A 08/01/2013   Procedure: LEFT HEART CATHETERIZATION WITH CORONARY  ANGIOGRAM;  Surgeon: Burnell Blanks, MD;  Location: [facility] CATH LAB;  Service: Cardiovascular;  Laterality: N/A;   PACEMAKER IMPLANT N/A 09/03/2021   Procedure: PACEMAKER IMPLANT;  Surgeon: Evans Lance, MD;  Location: [facility];  Service: Cardiovascular;  Laterality: N/A;   PERCUTANEOUS CORONARY STENT INTERVENTION (PCI-S)  05/31/2012   Procedure: PERCUTANEOUS CORONARY STENT INTERVENTION (PCI-S);  Surgeon: Peter M Martinique, MD;  Location: [facility] CATH LAB;  Service: Cardiovascular;;   PR VEIN BYPASS GRAFT,AORTO-FEM-POP Left 10/01/2009   left below knee popliteal artery to posterior tibial artery   SHOULDER ARTHROSCOPY WITH OPEN ROTATOR CUFF REPAIR AND DISTAL CLAVICLE ACROMINECTOMY Left 01/12/2013   Procedure: LEFT SHOULDER ARTHROSCOPY WITH DEBRIDEMENT OPEN DISTAL CLAVICLE RESECTION ,acromioplastyAND ROTATOR CUFF REPAIR;  Surgeon: Yvette Rack., MD;  Location: [facility];  Service: Orthopedics;  Laterality: Left;       Home Medications    Prior to Admission medications   Medication Sig Start Date End Date Taking? Authorizing Provider  abiraterone acetate (ZYTIGA) 250 MG tablet Take 1,000  mg by mouth daily.    [provider]  Accu-Chek FastClix Lancets MISC Test once daily 11/23/18   Henson, Vickie L, NP-C  albuterol (VENTOLIN HFA) 108 (90 Base) MCG/ACT inhaler INHALE 2 PUFFS INTO THE LUNGS EVERY 6 HOURS AS NEEDED FOR WHEEZING OR SHORTNESS OF BREATH 03/15/19   Henson, Vickie L, NP-C  amLODipine (NORVASC) 10 MG tablet TAKE 1 TABLET(10 MG) BY MOUTH DAILY 09/15/21   Kathleene Hazel, MD  aspirin EC 81 MG tablet Take 81 mg by mouth daily.     [provider]  brimonidine (ALPHAGAN) 0.2 % ophthalmic solution Place 1 drop into both eyes 2 (two) times daily. 05/09/20   [provider]  carboxymethylcellul-glycerin (REFRESH RELIEVA) 0.5-0.9 % ophthalmic solution Place 1 drop into both eyes daily as needed for dry eyes.    [provider]  diclofenac Sodium  (VOLTAREN) 1 % GEL Apply 2 g topically 2 (two) times daily as needed (back pain). 06/16/20   [provider]  docusate sodium (COLACE) 100 MG capsule Take 100 mg by mouth daily as needed for mild constipation.    [provider]  gabapentin (NEURONTIN) 600 MG tablet Take 600 mg by mouth 2 (two) times daily.    [provider]  isosorbide mononitrate (IMDUR) 120 MG 24 hr tablet Take 1 tablet (120 mg total) by mouth daily. 07/31/21   Jake Shark, PA-C  latanoprost (XALATAN) 0.005 % ophthalmic solution Place 1 drop into both eyes at bedtime. 02/28/20   [provider]  loratadine-pseudoephedrine (CLARITIN-D 24-HOUR) 10-240 MG 24 hr tablet Take 1 tablet by mouth daily as needed for allergies.    [provider]  nitroGLYCERIN (NITROSTAT) 0.4 MG SL tablet Place 1 tablet (0.4 mg total) under the tongue every 5 (five) minutes as needed for chest pain (max 3 doses). 10/05/21   Swinyer, Zachary George, NP  omeprazole (PRILOSEC) 20 MG capsule Take 20 mg by mouth daily.    [provider]  pravastatin (PRAVACHOL) 40 MG tablet TAKE 1 TABLET(40 MG) BY MOUTH EVERY EVENING 11/09/21   Kathleene Hazel, MD  predniSONE (DELTASONE) 5 MG tablet Take 5 mg by mouth daily. 07/17/20   [provider]  tamsulosin (FLOMAX) 0.4 MG CAPS capsule Take 0.4 mg by mouth at bedtime.    [provider]  traMADol (ULTRAM) 50 MG tablet Take 1 tablet (50 mg total) by mouth every 12 (twelve) hours as needed. 07/30/21   Jake Shark, PA-C    Family History Family History  Problem Relation Age of Onset   Breast cancer Mother        dx 82s   Hyperlipidemia Mother    Hypertension Mother    COPD Father        deceased   Hyperlipidemia Father    Hypertension Father    Hypertension Sister    Cancer Brother 25       Unknown cancer, possibly stomach   Hypertension Brother    Cancer Brother 75       Unknown, possibly lung   Hypertension Daughter     Hypertension Son    Colon cancer Neg Hx        pt is unclear about family hx    Social History Social History   Tobacco Use   Smoking status: Former    Packs/day: 0.50    Years: 52.00    Total pack years: 26.00    Types: Cigarettes    Quit date: 05/26/2011  Years since quitting: 10.4    Passive exposure: Past   Smokeless tobacco: Never  Vaping Use   Vaping Use: Never used  Substance Use Topics   Alcohol use: No   Drug use: No     Allergies   Topiramate, Atenolol, Lisinopril, and Tramadol-acetaminophen   Review of Systems Review of Systems  Neurological:  Positive for dizziness and weakness.     Physical Exam Triage Vital Signs ED Triage Vitals [11/13/21 1503]  Enc Vitals Group     BP 135/75     Pulse Rate 60     Resp 20     Temp 97.6 F (36.4 C)     Temp src      SpO2 98 %     Weight      Height      Head Circumference      Peak Flow      Pain Score 0     Pain Loc      Pain Edu?      Excl. in [place]?    No data found.  Updated Vital Signs BP 135/75   Pulse 60   Temp 97.6 F (36.4 C)   Resp 20   SpO2 98%   Visual Acuity Right Eye Distance:   Left Eye Distance:   Bilateral Distance:    Right Eye Near:   Left Eye Near:    Bilateral Near:     Physical Exam Vitals and nursing note reviewed.  Constitutional:      Comments: Alert and oriented and in no acute distress in the exam room.  He is able to give a history on his own without any difficulty.  Vital signs are reviewed and his heart rate is 60 and his blood pressure is 135/75 here  HENT:     Right Ear: Tympanic membrane and ear canal normal.     Left Ear: Tympanic membrane and ear canal normal.     Nose: Nose normal.     Mouth/Throat:     Mouth: Mucous membranes are moist.     Pharynx: No oropharyngeal exudate or posterior oropharyngeal erythema.  Eyes:     Extraocular Movements: Extraocular movements intact.     Conjunctiva/sclera: Conjunctivae normal.     Pupils: Pupils are equal,  round, and reactive to light.  Cardiovascular:     Rate and Rhythm: Normal rate and regular rhythm.     Heart sounds: No murmur heard. Pulmonary:     Effort: Pulmonary effort is normal.     Breath sounds: Normal breath sounds. No stridor. No wheezing, rhonchi or rales.  Abdominal:     Palpations: Abdomen is soft.     Tenderness: There is no abdominal tenderness.  Musculoskeletal:        General: No swelling or tenderness.     Cervical back: Neck supple.     Right lower leg: No edema.     Left lower leg: No edema.  Lymphadenopathy:     Cervical: No cervical adenopathy.  Skin:    Coloration: Skin is not jaundiced or pale.  Neurological:     General: No focal deficit present.     Mental Status: He is oriented to person, place, and time.  Psychiatric:        Behavior: Behavior normal.      UC Treatments / Results  Labs (all labs ordered are listed, but only abnormal results are displayed) Labs Reviewed  SARS CORONAVIRUS 2 (TAT 6-24 HRS)  CBC WITH DIFFERENTIAL/PLATELET  COMPREHENSIVE METABOLIC PANEL    EKG   Radiology No results found.  Procedures Procedures (including critical care time)  Medications Ordered in UC Medications - No data to display  Initial Impression / Assessment and Plan / UC Course  I have reviewed the triage vital signs and the nursing notes.  Pertinent labs & imaging results that were available during my care of the patient were reviewed by me and considered in my medical decision making (see chart for details).     EKG shows a paced rhythm with a rate of 63.  No worrisome ST changes.  We are going to do some basic lab work for him.  I advised him if he feels any worse to call for 911 to go to the emergency room.  COVID test is done, and if he is positive he should have a prescription for Paxlovid, unless there are interactions with his current medications.  If there are interactions, then he should have molnupiravir instead.  Last EGFR was  normal. Final Clinical Impressions(s) / UC Diagnoses   Final diagnoses:  Weakness     Discharge Instructions      Your EKG showed your pacemaker is working  Staff will notify you if there is anything significantly abnormal on your blood work    You have been swabbed for COVID, and the test will result in the next 24 hours. Our staff will call you if positive. If the test is positive, you should quarantine for 5 days from the start of your symptoms      ED Prescriptions   None    PDMP not reviewed this encounter.   Barrett Henle, MD 11/13/21 1559    Barrett Henle, MD 11/13/21 [phone]

## 2021-11-13 NOTE — ED Triage Notes (Signed)
Presents to uc with co of weakness and dizziness for a couple days

## 2021-11-13 NOTE — Discharge Instructions (Addendum)
Your EKG showed your pacemaker is working  Staff will notify you if there is anything significantly abnormal on your blood work    You have been swabbed for COVID, and the test will result in the next 24 hours. Our staff will call you if positive. If the test is positive, you should quarantine for 5 days from the start of your symptoms

## 2021-11-14 LAB — SARS CORONAVIRUS 2 (TAT 6-24 HRS): SARS Coronavirus 2: NEGATIVE

## 2021-11-17 ENCOUNTER — Other Ambulatory Visit: Payer: Self-pay | Admitting: Cardiovascular Disease

## 2021-11-17 ENCOUNTER — Other Ambulatory Visit: Payer: Self-pay

## 2021-11-17 DIAGNOSIS — I25110 Atherosclerotic heart disease of native coronary artery with unstable angina pectoris: Secondary | ICD-10-CM

## 2021-11-17 DIAGNOSIS — I2510 Atherosclerotic heart disease of native coronary artery without angina pectoris: Secondary | ICD-10-CM

## 2021-11-17 MED ORDER — ISOSORBIDE MONONITRATE ER 120 MG PO TB24: 120.0000 mg | ORAL_TABLET | Freq: Every day | ORAL | 1 refills | Status: DC

## 2021-11-18 ENCOUNTER — Telehealth: Payer: Self-pay | Admitting: Pharmacist

## 2021-11-18 NOTE — Chronic Care Management (AMB) (Signed)
Call to patient to confirm/remind of follow up call scheduled with Joel [name] on 11/19/21. Patient reports he would like to cancel and is uninterested in following up in the future. Prefers to follow up with Provider only. Upstream records updated.    [place] Clinical Pharmacist Assistant [phone]

## 2021-11-19 ENCOUNTER — Telehealth: Payer: Medicare HMO

## 2021-11-24 ENCOUNTER — Ambulatory Visit (INDEPENDENT_AMBULATORY_CARE_PROVIDER_SITE_OTHER): Payer: Medicare HMO | Admitting: Family Medicine

## 2021-11-24 ENCOUNTER — Encounter: Payer: Self-pay | Admitting: Family Medicine

## 2021-11-24 VITALS — BP 138/64 | HR 61 | Ht 69.0 in | Wt 239.4 lb

## 2021-11-24 DIAGNOSIS — E119 Type 2 diabetes mellitus without complications: Secondary | ICD-10-CM

## 2021-11-24 DIAGNOSIS — D509 Iron deficiency anemia, unspecified: Secondary | ICD-10-CM

## 2021-11-24 DIAGNOSIS — R739 Hyperglycemia, unspecified: Secondary | ICD-10-CM | POA: Diagnosis not present

## 2021-11-24 DIAGNOSIS — L723 Sebaceous cyst: Secondary | ICD-10-CM | POA: Diagnosis not present

## 2021-11-24 LAB — POCT GLYCOSYLATED HEMOGLOBIN (HGB A1C): Hemoglobin A1C: 6.6 % — AB (ref 4.0–5.6)

## 2021-11-24 NOTE — Progress Notes (Signed)
   Subjective:    [name], male    DOB: 07-24-1940, 81 y.o.   MRN: [id number]  HPI He is here because of difficulty with black stool.  He was seen on August 4 and in an urgent care center.  Blood work was done which did show a slightly low hemoglobin.  He actually realized that the black stool was iron that he was adding to his diet as well as a multivitamin.  When he stopped that the stool has returned to normal.  Review of the blood work also showed a blood sugar of 120.  He has no previous history of diabetes.  He also has a lesion on his chest that he would like me to look at.   Review of Systems     Objective:   Physical Exam Alert and in no distress.  Round smooth lesion noted on on the mid chest area with central black spot.       Assessment & Plan:  Elevated blood sugar - Plan: POCT glycosylated hemoglobin (Hb A1C)  Iron deficiency anemia, unspecified iron deficiency anemia type  Sebaceous cyst  New onset type 2 diabetes mellitus ([place]) I explained that the cyst was nothing to be concerned about unless it became infected.  Did recommend a good multivitamin. I briefly discussed with new-onset diabetes diagnosis with him.  I will set him up to come back in roughly 1 week for more education concerning his diabetes.

## 2021-12-01 ENCOUNTER — Ambulatory Visit (INDEPENDENT_AMBULATORY_CARE_PROVIDER_SITE_OTHER): Payer: Medicare HMO | Admitting: Medical

## 2021-12-01 VITALS — BP 120/72 | HR 73 | Wt 238.0 lb

## 2021-12-01 DIAGNOSIS — C61 Malignant neoplasm of prostate: Secondary | ICD-10-CM

## 2021-12-01 DIAGNOSIS — E7800 Pure hypercholesterolemia, unspecified: Secondary | ICD-10-CM

## 2021-12-01 DIAGNOSIS — E1165 Type 2 diabetes mellitus with hyperglycemia: Secondary | ICD-10-CM | POA: Diagnosis not present

## 2021-12-01 DIAGNOSIS — I10 Essential (primary) hypertension: Secondary | ICD-10-CM

## 2021-12-01 DIAGNOSIS — I739 Peripheral vascular disease, unspecified: Secondary | ICD-10-CM | POA: Diagnosis not present

## 2021-12-01 NOTE — Progress Notes (Signed)
Subjective: Chief Complaint  Patient presents with   discuss diabetes     Discuss diabetes onset. Does check sugars every once in a while    Here to discuss diabetes.    Medical team: Dr. Lauree Chandler, cardiology Dr. Alver Sorrow, hematology /oncology Tye Savoy, NP and Dr. Silvano Rusk, GI Dr. Daylene Katayama, podiatry He also sees the [facility] for care.  He is currently being treated for prostate cancer through urology.  He is on prednisone and Zytiga.  He has been on prednisone and this medicine for months and will likely be on this for several more months.  He is compliant with statin for hyperlipidemia  He sees cardiology, has a pacemaker.  Does get some swelling in his legs but uses compression hose.  He has history of anemia.  No blood in the stool.  She takes a Dance movement psychotherapist.  Not currently taking iron.  He was seen by Dr. Redmond School here recently and was advised to change to multivitamin and stop plain iron given black stools.  He is still.  He does check his glucose at home some.  No low readings and no very high readings.  He plans to get the shingles vaccine at the pharmacy today.  He saw his eye doctor recently, Dr. Katy Fitch.  No other aggravating or relieving factors. No other complaint.  Past Medical History:  Diagnosis Date   Atypical chest pain    CAD (coronary artery disease)    a. Multiple prior evaluations then 05/2012 s/p BMS to OM2. b. Nuc 11/2012 wnl. c. Cath 2015: patent stent, moderate LAD/RCA dz, treated medically.   Cataract    CHEST PAIN UNSPECIFIED 09/13/2008   Qualifier: Diagnosis of  By: Rose Fillers, RN, Heather     CKD (chronic kidney disease), stage II    COPD (chronic obstructive pulmonary disease) (HCC)    Cough secondary to angiotensin converting enzyme inhibitor (ACE-I)    Diabetes mellitus ([place]) 09/07/2019   Diabetes mellitus without complication (HCC)    Diverticulosis    DVT (deep venous thrombosis) (HCC)    Family history of  breast cancer in first degree relative    GERD (gastroesophageal reflux disease)    Headache(784.0)    Hx: of   History of blood transfusion    "I've had 2; don't know what it was related to" (11/09/2012)   HLD (hyperlipidemia)    HTN (hypertension)    Iron deficiency anemia    Lymphoproliferative disorder, low grade B cell ([place]) 05/21/2020   Microcytic anemia    Peripheral vascular disease ([place])    a. L pop-tibial bypass 2011, followed by [facility].   Personal history of prostate cancer    Rotator cuff injury    "left arm; never repaired" (11/09/2012)   Current Outpatient Medications on File Prior to Visit  Medication Sig Dispense Refill   abiraterone acetate (ZYTIGA) 250 MG tablet Take 1,000 mg by mouth daily.     Accu-Chek FastClix Lancets MISC Test once daily 102 each 12   albuterol (VENTOLIN HFA) 108 (90 Base) MCG/ACT inhaler INHALE 2 PUFFS INTO THE LUNGS EVERY 6 HOURS AS NEEDED FOR WHEEZING OR SHORTNESS OF BREATH 54 g 0   amLODipine (NORVASC) 10 MG tablet TAKE 1 TABLET(10 MG) BY MOUTH DAILY 90 tablet 1   aspirin EC 81 MG tablet Take 81 mg by mouth daily.      brimonidine (ALPHAGAN) 0.2 % ophthalmic solution Place 1 drop into both eyes 2 (two) times daily.  carboxymethylcellul-glycerin (REFRESH RELIEVA) 0.5-0.9 % ophthalmic solution Place 1 drop into both eyes daily as needed for dry eyes.     diclofenac Sodium (VOLTAREN) 1 % GEL Apply 2 g topically 2 (two) times daily as needed (back pain).     docusate sodium (COLACE) 100 MG capsule Take 100 mg by mouth daily as needed for mild constipation.     gabapentin (NEURONTIN) 600 MG tablet Take 600 mg by mouth 2 (two) times daily.     isosorbide mononitrate (IMDUR) 120 MG 24 hr tablet Take 1 tablet (120 mg total) by mouth daily. 90 tablet 1   latanoprost (XALATAN) 0.005 % ophthalmic solution Place 1 drop into both eyes at bedtime.     loratadine-pseudoephedrine (CLARITIN-D 24-HOUR) 10-240 MG 24 hr tablet Take 1 tablet by mouth daily as needed  for allergies.     nitroGLYCERIN (NITROSTAT) 0.4 MG SL tablet Place 1 tablet (0.4 mg total) under the tongue every 5 (five) minutes as needed for chest pain (max 3 doses). 25 tablet 3   omeprazole (PRILOSEC) 20 MG capsule Take 20 mg by mouth daily.     pravastatin (PRAVACHOL) 40 MG tablet TAKE 1 TABLET(40 MG) BY MOUTH EVERY EVENING 90 tablet 3   predniSONE (DELTASONE) 5 MG tablet Take 5 mg by mouth daily.     tamsulosin (FLOMAX) 0.4 MG CAPS capsule Take 0.4 mg by mouth at bedtime.     traMADol (ULTRAM) 50 MG tablet Take 1 tablet (50 mg total) by mouth every 12 (twelve) hours as needed. 30 tablet 0   No current facility-administered medications on file prior to visit.    ROS as in subjective    Objective: BP 120/72   Pulse 73   Wt 238 lb (108 kg)   BMI 35.15 kg/m   Gen: wd, wn, nad Otherwise not examined     Assessment: Encounter Diagnoses  Name Primary?   PVD (peripheral vascular disease) with claudication (HCC) Yes   Prostate cancer ([place])    Controlled type 2 diabetes mellitus with hyperglycemia, without long-term current use of insulin ([place])    Essential hypertension    Pure hypercholesterolemia      Plan: Although he seems to be under the impression that this is a new diagnosis, looking back in his chart records for several years his hemoglobin A1c has been over 6.5% and glucose readings in prior years have had several over 126 fasting.  He apparently is diet-controlled diabetic.  We will try to get a copy of his recent eye exam.  Discussed diagnosis of diabetes, criteria to make the diagnosis, possible complications of diabetes, and the opportunity to make lifestyle changes to get this under control.  Discussed short term goals of diabetes care.    Discussed follow up, typically every 3 months, lab monitoring, importance of HgbA1C.   Discussed diet in great detail, importance of exercise.  Discussed vaccinations, discussed general preventative measures including eye  exams yearly with eye doctor, dental care, routine f/u here.   Prostate cancer-managed by urology including prednisone therapy which will keep the sugar somewhat elevated.   Hypertension-continue current medication, follow-up with cardiology  Hyperlipidemia-continue statin  Zebadiah was seen today for discuss diabetes .  Diagnoses and all orders for this visit:  PVD (peripheral vascular disease) with claudication ([place])  Prostate cancer ([place])  Controlled type 2 diabetes mellitus with hyperglycemia, without long-term current use of insulin (HCC)  Essential hypertension  Pure hypercholesterolemia  Spent > 45 minutes face to face with patient  in discussion of symptoms, evaluation, plan and recommendations.    F/u 52mo

## 2021-12-01 NOTE — Patient Instructions (Signed)
Type 2 Diabetes Diabetes is a long-lasting (chronic) disease.  With diabetes, either the pancreas does not make enough of a hormone called insulin, or the body has trouble using the insulin that is made. Over time, diabetes can damage the eyes, kidneys, and nerves causing retinopathy, nephropathy, and neuropathy.  Diabetes puts you at risk for heart disease and peripheral vascular disease which can lead to heart attack, stroke, foot ulcers, and amputations.   Our goal and hopefully your goal is to manage your diabetes in such a way to slow the progression of the disease and do all we can to keep you healthy  Home Care:  Eat healthy, exercise regularly, limit alcohol, and don't smoke! Check your blood sugar (glucose) once a day before breakfast, or as indicated by our discussion today.  The goal is to keep your morning fasting sugar less than 130.  In general you should try to keep your blood sugars between 80 - 130 fasting or before meals. We also want to avoid hypoglycemia or low blood sugars less than 70.  Make sure you understand how to treat hypoglycemic episodes.  Ask me about this if we had not discussed this. If you are on medication, take your medications daily, don't run out of medications. Learn about low blood sugar (hypoglycemia). Know how to treat it. Wear a necklace or bracelet that says you have diabetes in the event of emergency for first responders to identify you have diabetes. Check your feet every night for cuts, sores, blisters, and redness. Tell your medical provider if you have problems. Maintain a normal body weight, or normal BMI - height to weight ratio of 20-25.  Ask me about this.  GET HELP RIGHT AWAY IF: You have trouble keeping your blood sugar in target range. You have problems with your medicines. You are sick and not getting better after 24 hours. You have a sore or wound that is not healing. You have vision problems or changes. You have a  fever.   Exercise regularly since it has beneficial effects on the heart and blood sugars. Exercising at least three times per week or 150 minutes per week can be as important as medication to a diabetic.  Find some form of exercise that you will enjoy doing regularly.  This can include walking, biking, kayaking, golfing, swimming, dance, aerobics, hiking, etc.  If you have joint problems, many local gyms have equipment to accommodate people with specific needs.    Vaccinations:  Diabetics are at increased risk for infection, and illnesses can take longer to resolve.  Current vaccine recommendations include yearly Influenza (flu) vaccine (recommended in October), Pneumococcal vaccine, Hepatitis B vaccine series, Tdap (tetanus, diptheria and pertussis) vaccine every 10 years, Covid vaccination, and other age appropriate vaccinations.     Office visits:  We recommend routine medical care to make sure we are addressing prevention and issues as they arise.  Typically this could mean twice yearly or up to quarterly depending upon your unique health situation.  Exams should include a yearly physical, a yearly foot exam, and other examination as appropriate.  You should see an eye doctor yearly to help screen for and prevent blood vessel complications in your eyes.  Labs: Diabetics should have blood work done at least twice yearly to monitor your Hemoglobin A1C (a three-month average of your blood sugars) and your cholesterol.  You should have your urine and blood checked yearly to screen for kidney damage.  This may include creatinine and  micro-albumin levels.  Other labs as appropriate.    Blood pressure goals:  Goal blood pressure in diabetics should be 130/80 or less. Monitoring your blood pressure with a home blood pressure cuff of your own is an excellent idea.  If you are prescribed medication for blood pressure, take your medication every day, and don't run out of medication.  Having high blood pressure  can damage your heart, eyes, kidneys, and put you at risk for heart attack and stroke.  Tobacco use:  If you smoke, dip or chew, quitting will reduce your risk of heart attack, stroke, peripheral vascular disease, and many cancers.  Other Specialists:   Eye doctor: See your eye doctor yearly for routine eye exam and diabetic eye exam.  Make sure your eye doctor sends [place] a copy of the report  Dentist: See your dentist twice yearly for dental hygiene visits and routine dental care.  Brush and floss your teeth every day.  Dental hygiene is and important part of diabetic care.  Diabetics sometimes see other specialist depending on the complexity of your health issues.    Diabetic Report Card for Joel [name]  December 01, 2021  Below is a summary of recent tests related to your diabetes that can help you manage your health.   Hemoglobin A1C:  Your Hemoglobin A1C values should be less than 7. If these are greater than 7, you have a higher chance of having eye, heart, and kidney problems in the future.   Your most recent Hemoglobin A1C values were:  Hemoglobin A1C (%)  Date Value  11/24/2021 6.6 (A)   Hgb A1c MFr Bld (%)  Date Value  06/15/2021 6.6 (H)  01/16/2021 6.1 (H)  07/17/2020 6.6 (H)     Cholesterol:  Your LDL Cholesterol (bad cholesterol) values should be less than 100 mg/dL if you do not have cardiovascular disease.  The LDL should be less than 70 mg/dL if you do have cardiovascular disease.  If your LDL is consistently higher than 100 mg/dL, then your risk of heart attack and stroke increases yearly.   Your most recent LDL Cholesterol (bad cholesterol) results were:  LDL Chol Calc (NIH) (mg/dL)  Date Value  06/15/2021 67  09/03/2020 49  07/17/2020 65     Your HDL Cholesterol (good cholesterol) values should be higher than 40 mg/dL.  If your HDL is lower than 40 mg/dL, this increases your risk of heart attack and stroke.   Your most recent HDL Cholesterol (bad  cholesterol) results were:  No components found for: "HDLCALC"    Blood Pressure:  Your blood pressure values should be less than 130/80. Please contact me if your readings at home are consistently higher than this.   Your most recent blood pressure readings at our clinic were:  BP Readings from Last 3 Encounters:  12/01/21 120/72  11/24/21 138/64  11/13/21 135/75    Urine Protein: Having an elevated microalbumin to creatinine ratio is a marker for early kidney damage due to diabetes or high blood pressure.     Your most recent urine protein results were:  Lab Results  Component Value Date   MICROALBUR <0.2 03/22/2016

## 2021-12-10 ENCOUNTER — Ambulatory Visit (HOSPITAL_BASED_OUTPATIENT_CLINIC_OR_DEPARTMENT_OTHER): Payer: Medicare HMO | Attending: Cardiology | Admitting: Cardiology

## 2021-12-10 DIAGNOSIS — G4733 Obstructive sleep apnea (adult) (pediatric): Secondary | ICD-10-CM | POA: Insufficient documentation

## 2021-12-10 DIAGNOSIS — R400 Somnolence: Secondary | ICD-10-CM | POA: Insufficient documentation

## 2021-12-10 DIAGNOSIS — R0681 Apnea, not elsewhere classified: Secondary | ICD-10-CM

## 2021-12-10 DIAGNOSIS — G4731 Primary central sleep apnea: Secondary | ICD-10-CM | POA: Diagnosis not present

## 2021-12-11 ENCOUNTER — Ambulatory Visit (INDEPENDENT_AMBULATORY_CARE_PROVIDER_SITE_OTHER): Payer: Medicare HMO

## 2021-12-11 DIAGNOSIS — I442 Atrioventricular block, complete: Secondary | ICD-10-CM

## 2021-12-14 LAB — CUP PACEART REMOTE DEVICE CHECK
Battery Remaining Longevity: 83 mo
Battery Remaining Percentage: 95.5 %
Battery Voltage: 3.01 V
Brady Statistic AP VP Percent: 46 %
Brady Statistic AP VS Percent: 1 %
Brady Statistic AS VP Percent: 52 %
Brady Statistic AS VS Percent: 1 %
Brady Statistic RA Percent Paced: 45 %
Brady Statistic RV Percent Paced: 98 %
Date Time Interrogation Session: 20230901090849
Implantable Lead Implant Date: 20230525
Implantable Lead Implant Date: 20230525
Implantable Lead Location: 753859
Implantable Lead Location: 753860
Implantable Pulse Generator Implant Date: 20230525
Lead Channel Impedance Value: 430 Ohm
Lead Channel Impedance Value: 430 Ohm
Lead Channel Pacing Threshold Amplitude: 0.5 V
Lead Channel Pacing Threshold Amplitude: 0.875 V
Lead Channel Pacing Threshold Pulse Width: 0.5 ms
Lead Channel Pacing Threshold Pulse Width: 0.5 ms
Lead Channel Sensing Intrinsic Amplitude: 5 mV
Lead Channel Sensing Intrinsic Amplitude: 6.1 mV
Lead Channel Setting Pacing Amplitude: 1.125
Lead Channel Setting Pacing Amplitude: 3.5 V
Lead Channel Setting Pacing Pulse Width: 0.5 ms
Lead Channel Setting Sensing Sensitivity: 4 mV
Pulse Gen Model: 2272
Pulse Gen Serial Number: 8084442

## 2021-12-16 ENCOUNTER — Ambulatory Visit: Payer: Medicare HMO | Admitting: Medical

## 2021-12-17 ENCOUNTER — Encounter: Payer: Self-pay | Admitting: Internal Medicine

## 2021-12-17 ENCOUNTER — Ambulatory Visit: Payer: Medicare HMO | Attending: Internal Medicine | Admitting: Internal Medicine

## 2021-12-17 VITALS — BP 138/70 | HR 62 | Ht 69.0 in | Wt 241.7 lb

## 2021-12-17 DIAGNOSIS — I459 Conduction disorder, unspecified: Secondary | ICD-10-CM | POA: Diagnosis not present

## 2021-12-17 DIAGNOSIS — I2510 Atherosclerotic heart disease of native coronary artery without angina pectoris: Secondary | ICD-10-CM | POA: Diagnosis not present

## 2021-12-17 DIAGNOSIS — Z950 Presence of cardiac pacemaker: Secondary | ICD-10-CM | POA: Diagnosis not present

## 2021-12-17 MED ORDER — APIXABAN 5 MG PO TABS: 5.0000 mg | ORAL_TABLET | Freq: Two times a day (BID) | ORAL | 5 refills | Status: DC

## 2021-12-17 NOTE — Patient Instructions (Addendum)
Medication Instructions:  Your physician has recommended you make the following change in your medication:   Start Taking-  Eliquis 5 MG   Take one tablet ( 5 MG ) by mouth twice a day.   Pt given 4 boxes of free Eliquis 5 MG samples, and Eliquis refill card with paperwork.    Lab Work: None ordered.  If you have labs (blood work) drawn today and your tests are completely normal, you will receive your results only by: [place] (if you have MyChart) OR A paper copy in the mail If you have any lab test that is abnormal or we need to change your treatment, we will call you to review the results.  Testing/Procedures: None ordered.  Follow-Up: At [facility], you and your health needs are our priority.  As part of our continuing mission to provide you with exceptional heart care, we have created designated Provider Care Teams.  These Care Teams include your primary Cardiologist (physician) and Advanced Practice Providers (APPs -  Physician Assistants and Nurse Practitioners) who all work together to provide you with the care you need, when you need it.  We recommend signing up for the patient portal called "MyChart".  Sign up information is provided on this After Visit Summary.  MyChart is used to connect with patients for Virtual Visits (Telemedicine).  Patients are able to view lab/test results, encounter notes, upcoming appointments, etc.  Non-urgent messages can be sent to your provider as well.   To learn more about what you can do with MyChart, go to [email or web address].    Your next appointment:   1 year(s)  The format for your next appointment:   In Person  Provider:   Cristopher Peru, MD{or one of the following Advanced Practice Providers on your designated Care Team:   Tommye Standard, [place] Legrand Como "Jonni Sanger" Chalmers Cater, [place]  Remote monitoring is used to monitor your Pacemaker from home. This monitoring reduces the number of office visits required to check your device to one  time per year. It allows [place] to keep an eye on the functioning of your device to ensure it is working properly. You are scheduled for a device check from home on 03/12/2022. You may send your transmission at any time that day. If you have a wireless device, the transmission will be sent automatically. After your physician reviews your transmission, you will receive a postcard with your next transmission date.  Important Information About Sugar

## 2021-12-17 NOTE — Progress Notes (Signed)
HPI [name] returns today for ongoing evaluation of transient CHB. He is a pleasant 81 yo man with a h/o HTN, PAD, CAD, and has undergone cardiac monitoring which has demonstrated transient daytime heart block with 4 second pauses. He has not had syncope though gets a little lightheaded. In addition he has non-sustained VT. He is on no AV or sinus nodal blocking drugs. he is pacing 98% of the time in the ventricle. Allergies  Allergen Reactions   Topiramate Shortness Of Breath and Other (See Comments)     leg cramps   Atenolol Other (See Comments)    bradycardia   Lisinopril Cough   Tramadol-Acetaminophen Cough    Tolerates plain Tramadol     Current Outpatient Medications  Medication Sig Dispense Refill   abiraterone acetate (ZYTIGA) 250 MG tablet Take 1,000 mg by mouth daily.     Accu-Chek FastClix Lancets MISC Test once daily 102 each 12   albuterol (VENTOLIN HFA) 108 (90 Base) MCG/ACT inhaler INHALE 2 PUFFS INTO THE LUNGS EVERY 6 HOURS AS NEEDED FOR WHEEZING OR SHORTNESS OF BREATH 54 g 0   amLODipine (NORVASC) 10 MG tablet TAKE 1 TABLET(10 MG) BY MOUTH DAILY 90 tablet 1   apixaban (ELIQUIS) 5 MG TABS tablet Take 1 tablet (5 mg total) by mouth 2 (two) times daily. 60 tablet 5   aspirin EC 81 MG tablet Take 81 mg by mouth daily.      brimonidine (ALPHAGAN) 0.2 % ophthalmic solution Place 1 drop into both eyes 2 (two) times daily.     carboxymethylcellul-glycerin (REFRESH RELIEVA) 0.5-0.9 % ophthalmic solution Place 1 drop into both eyes daily as needed for dry eyes.     diclofenac Sodium (VOLTAREN) 1 % GEL Apply 2 g topically 2 (two) times daily as needed (back pain).     docusate sodium (COLACE) 100 MG capsule Take 100 mg by mouth daily as needed for mild constipation.     gabapentin (NEURONTIN) 600 MG tablet Take 600 mg by mouth 2 (two) times daily.     isosorbide mononitrate (IMDUR) 120 MG 24 hr tablet Take 1 tablet (120 mg total) by mouth daily. 90 tablet 1    latanoprost (XALATAN) 0.005 % ophthalmic solution Place 1 drop into both eyes at bedtime.     loratadine-pseudoephedrine (CLARITIN-D 24-HOUR) 10-240 MG 24 hr tablet Take 1 tablet by mouth daily as needed for allergies.     nitroGLYCERIN (NITROSTAT) 0.4 MG SL tablet Place 1 tablet (0.4 mg total) under the tongue every 5 (five) minutes as needed for chest pain (max 3 doses). 25 tablet 3   omeprazole (PRILOSEC) 20 MG capsule Take 20 mg by mouth daily.     pravastatin (PRAVACHOL) 40 MG tablet TAKE 1 TABLET(40 MG) BY MOUTH EVERY EVENING 90 tablet 3   predniSONE (DELTASONE) 5 MG tablet Take 5 mg by mouth daily.     tamsulosin (FLOMAX) 0.4 MG CAPS capsule Take 0.4 mg by mouth at bedtime.     traMADol (ULTRAM) 50 MG tablet Take 1 tablet (50 mg total) by mouth every 12 (twelve) hours as needed. 30 tablet 0   No current facility-administered medications for this visit.     Past Medical History:  Diagnosis Date   Atypical chest pain    CAD (coronary artery disease)    a. Multiple prior evaluations then 05/2012 s/p BMS to OM2. b. Nuc 11/2012 wnl. c. Cath 2015: patent stent, moderate LAD/RCA dz, treated medically.   Cataract  CHEST PAIN UNSPECIFIED 09/13/2008   Qualifier: Diagnosis of  By: Rose Fillers, RN, [place]     CKD (chronic kidney disease), stage II    COPD (chronic obstructive pulmonary disease) (HCC)    Cough secondary to angiotensin converting enzyme inhibitor (ACE-I)    Diabetes mellitus ([place]) 09/07/2019   Diabetes mellitus without complication (HCC)    Diverticulosis    DVT (deep venous thrombosis) (HCC)    Family history of breast cancer in first degree relative    GERD (gastroesophageal reflux disease)    Headache(784.0)    Hx: of   History of blood transfusion    "I've had 2; don't know what it was related to" (11/09/2012)   HLD (hyperlipidemia)    HTN (hypertension)    Iron deficiency anemia    Lymphoproliferative disorder, low grade B cell ([place]) 05/21/2020   Microcytic anemia     Peripheral vascular disease ([place])    a. L pop-tibial bypass 2011, followed by [facility].   Personal history of prostate cancer    Rotator cuff injury    "left arm; never repaired" (11/09/2012)    ROS:   All systems reviewed and negative except as noted in the HPI.   Past Surgical History:  Procedure Laterality Date   CARDIOVASCULAR STRESS TEST  Aug. 2014   CATARACT EXTRACTION W/ INTRAOCULAR LENS IMPLANT Right ~ 2008   CIRCUMCISION     COLONOSCOPY     CORONARY ANGIOPLASTY WITH STENT PLACEMENT  2014   "1" (11/09/2012)   ILIAC ARTERY ANEURYSM REPAIR     LEFT HEART CATH  05-31-12   LEFT HEART CATH AND CORONARY ANGIOGRAPHY N/A 08/01/2019   Procedure: LEFT HEART CATH AND CORONARY ANGIOGRAPHY;  Surgeon: Burnell Blanks, MD;  Location: [facility];  Service: Cardiovascular;  Laterality: N/A;   LEFT HEART CATHETERIZATION WITH CORONARY ANGIOGRAM N/A 06/01/2011   Procedure: LEFT HEART CATHETERIZATION WITH CORONARY ANGIOGRAM;  Surgeon: Hillary Bow, MD;  Location: [facility] CATH LAB;  Service: Cardiovascular;  Laterality: N/A;   LEFT HEART CATHETERIZATION WITH CORONARY ANGIOGRAM N/A 05/31/2012   Procedure: LEFT HEART CATHETERIZATION WITH CORONARY ANGIOGRAM;  Surgeon: Peter M Martinique, MD;  Location: [facility] CATH LAB;  Service: Cardiovascular;  Laterality: N/A;   LEFT HEART CATHETERIZATION WITH CORONARY ANGIOGRAM N/A 08/01/2013   Procedure: LEFT HEART CATHETERIZATION WITH CORONARY ANGIOGRAM;  Surgeon: Burnell Blanks, MD;  Location: [facility] CATH LAB;  Service: Cardiovascular;  Laterality: N/A;   PACEMAKER IMPLANT N/A 09/03/2021   Procedure: PACEMAKER IMPLANT;  Surgeon: Evans Lance, MD;  Location: [facility];  Service: Cardiovascular;  Laterality: N/A;   PERCUTANEOUS CORONARY STENT INTERVENTION (PCI-S)  05/31/2012   Procedure: PERCUTANEOUS CORONARY STENT INTERVENTION (PCI-S);  Surgeon: Peter M Martinique, MD;  Location: [facility] CATH LAB;  Service: Cardiovascular;;   PR VEIN BYPASS GRAFT,AORTO-FEM-POP  Left 10/01/2009   left below knee popliteal artery to posterior tibial artery   SHOULDER ARTHROSCOPY WITH OPEN ROTATOR CUFF REPAIR AND DISTAL CLAVICLE ACROMINECTOMY Left 01/12/2013   Procedure: LEFT SHOULDER ARTHROSCOPY WITH DEBRIDEMENT OPEN DISTAL CLAVICLE RESECTION ,acromioplastyAND ROTATOR CUFF REPAIR;  Surgeon: Yvette Rack., MD;  Location: [facility];  Service: Orthopedics;  Laterality: Left;     Family History  Problem Relation Age of Onset   Breast cancer Mother        dx 69s   Hyperlipidemia Mother    Hypertension Mother    COPD Father        deceased   Hyperlipidemia Father    Hypertension  Father    Hypertension Sister    Cancer Brother 89       Unknown cancer, possibly stomach   Hypertension Brother    Cancer Brother 30       Unknown, possibly lung   Hypertension Daughter    Hypertension Son    Colon cancer Neg Hx        pt is unclear about family hx     Social History   Socioeconomic History   Marital status: Widowed    Spouse name: Not on file   Number of children: Not on file   Years of education: Not on file   Highest education level: 12th grade  Occupational History   Occupation: Retired  Tobacco Use   Smoking status: Former    Packs/day: 0.50    Years: 52.00    Total pack years: 26.00    Types: Cigarettes    Quit date: 05/26/2011    Years since quitting: 10.5    Passive exposure: Past   Smokeless tobacco: Never  Vaping Use   Vaping Use: Never used  Substance and Sexual Activity   Alcohol use: No   Drug use: No   Sexual activity: Not on file  Other Topics Concern   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  Lives in [place] by himself.      Patient writes with his right hand, though uses his left hand for every thing else. He lives alone in a one level home. He drinks 3 cups of coffee a day and 3 7-8 oz sodas a day. He does not exercise.   Social Determinants of Health   Financial Resource Strain: Low  Risk  (06/11/2021)   Overall Financial Resource Strain (CARDIA)    Difficulty of Paying Living Expenses: Not hard at all  Food Insecurity: No Food Insecurity (05/29/2021)   Hunger Vital Sign    Worried About Running Out of Food in the Last Year: Never true    Ran Out of Food in the Last Year: Never true  Transportation Needs: No Transportation Needs (06/11/2021)   PRAPARE - Hydrologist (Medical): No    Lack of Transportation (Non-Medical): No  Physical Activity: Inactive (05/29/2021)   Exercise Vital Sign    Days of Exercise per Week: 0 days    Minutes of Exercise per Session: 0 min  Stress: No Stress Concern Present (05/29/2021)   [place]    Feeling of Stress : Not at all  Social Connections: Not on file  Intimate Partner Violence: Not on file     BP 138/70   Pulse 62   Ht '5\' 9"'$  (1.753 m)   Wt 241 lb 11.2 oz (109.6 kg)   SpO2 96%   BMI 35.69 kg/m   Physical Exam:  Well appearing NAD HEENT: Unremarkable Neck:  No JVD, no thyromegally Lymphatics:  No adenopathy Back:  No CVA tenderness Lungs:  Clear with no wheezes HEART:  Regular rate rhythm, no murmurs, no rubs, no clicks Abd:  soft, positive bowel sounds, no organomegally, no rebound, no guarding Ext:  2 plus pulses, no edema, no cyanosis, no clubbing Skin:  No rashes no nodules Neuro:  CN II through XII intact, motor grossly intact  EKG - nsr with AV pacing  DEVICE  Normal device function.  See PaceArt for details.   Assess/Plan:   Heart block - he  has progressive conduction system disease and is now s/p PPM insertion. CAD - he denies anginal symptoms. Once his PPM is inserted , would encourage a beta blocker NSVT - he is symptomatic and hopefully this can be controlled with medical therapy after his PPM is inserted. PPM - he is s/p insertion of a [employer] Jude DDD PM. His current device is  working normally. We have turned  down his outputs.    Carleene Overlie Kamiya Acord,MD

## 2021-12-18 LAB — CUP PACEART INCLINIC DEVICE CHECK
Battery Remaining Longevity: 96 mo
Battery Voltage: 3.02 V
Brady Statistic RA Percent Paced: 44 %
Brady Statistic RV Percent Paced: 98 %
Date Time Interrogation Session: 20230907151300
Implantable Lead Implant Date: 20230525
Implantable Lead Implant Date: 20230525
Implantable Lead Location: 753859
Implantable Lead Location: 753860
Implantable Pulse Generator Implant Date: 20230525
Lead Channel Impedance Value: 425 Ohm
Lead Channel Impedance Value: 425 Ohm
Lead Channel Pacing Threshold Amplitude: 0.5 V
Lead Channel Pacing Threshold Amplitude: 0.5 V
Lead Channel Pacing Threshold Amplitude: 0.75 V
Lead Channel Pacing Threshold Amplitude: 0.75 V
Lead Channel Pacing Threshold Pulse Width: 0.5 ms
Lead Channel Pacing Threshold Pulse Width: 0.5 ms
Lead Channel Pacing Threshold Pulse Width: 0.5 ms
Lead Channel Pacing Threshold Pulse Width: 0.5 ms
Lead Channel Sensing Intrinsic Amplitude: 4.3 mV
Lead Channel Sensing Intrinsic Amplitude: 5 mV
Lead Channel Setting Pacing Amplitude: 2 V
Lead Channel Setting Pacing Amplitude: 2.5 V
Lead Channel Setting Pacing Pulse Width: 0.5 ms
Lead Channel Setting Sensing Sensitivity: 4 mV
Pulse Gen Model: 2272
Pulse Gen Serial Number: 8084442

## 2021-12-21 DIAGNOSIS — H1132 Conjunctival hemorrhage, left eye: Secondary | ICD-10-CM | POA: Diagnosis not present

## 2021-12-21 NOTE — Procedures (Signed)
   Patient Name: Joel [name], Joel [name] Date: 12/10/2021 Gender: Male D.O.B: 16-May-1940 Age (years): 81 Referring Provider: Tami Lin Duke Height (inches): 48 Interpreting Physician: Fransico Him MD, ABSM Weight (lbs): 238 RPSGT: Laren Everts BMI: 35 MRN: [id number] Neck Size: 16.50  CLINICAL INFORMATION The patient is referred for a BiPAP titration to treat sleep apnea.  SLEEP STUDY TECHNIQUE As per the AASM Manual for the Scoring of Sleep and Associated Events v2.3 (April 2016) with a hypopnea requiring 4% desaturations.  The channels recorded and monitored were frontal, central and occipital EEG, electrooculogram (EOG), submentalis EMG (chin), nasal and oral airflow, thoracic and abdominal wall motion, anterior tibialis EMG, snore microphone, electrocardiogram, and pulse oximetry. Bilevel positive airway pressure (BPAP) was initiated at the beginning of the study and titrated to treat sleep-disordered breathing.  MEDICATIONS Medications self-administered by patient taken the night of the study : PRAVASTATIN, GABAPENTIN, TAMSULOSIN HYDROCHLORIDE  RESPIRATORY PARAMETERS Optimal IPAP Pressure (cm): 21  AHI at Optimal Pressure (/hr) 5 Optimal EPAP Pressure (cm):17  Overall Minimal O2 (%):60.0 Minimal O2 at Optimal Pressure (%): 92.0  SLEEP ARCHITECTURE Start Time:10:59:05 PM  Stop Time:5:42:27 AM  Total Time (min):403.4  Total Sleep Time (min):305 Sleep Latency (min):4.3  Sleep Efficiency (%):75.6%  REM Latency (min):69.0  WASO (min):94.1 Stage N1 (%): 25.7%  Stage N2 (%): 59.3%  Stage N3 (%):0.0%  Stage R (%):14.9 Supine (%):100.00  Arousal Index (/hr):25.6   CARDIAC DATA The 2 lead EKG demonstrated pacemaker generated. The mean heart rate was 63.4 beats per minute. Other EKG findings include: PVCs  LEG MOVEMENT DATA The total Periodic Limb Movements of Sleep (PLMS) were 0. The PLMS index was 0.0. A PLMS index of <15 is considered normal in  adults.  IMPRESSIONS - An optimal PAP pressure was selected for this patient ( 27 / cm of water) - Mild Central Sleep Apnea was noted during this titration (CAI = 12.6/h). - Severe oxygen desaturations were observed during this titration (min O2 = 60.0%). - The patient snored with moderate snoring volume. - No cardiac abnormalities were observed during this study. - Clinically significant periodic limb movements were not noted during this study. Arousals associated with PLMs were rare.  DIAGNOSIS - Obstructive Sleep Apnea (G47.33) - Nocturnal Hypoxemia  RECOMMENDATIONS - Trial of autoBiPAP therapy with IPAP max 22 cm H2O, EPAP min 5cm H2O and PS 4cm H2O with a Large size Resmed Full Face Mirage Quattro mask and heated humidification. - Avoid alcohol, sedatives and other CNS depressants that may worsen sleep apnea and disrupt normal sleep architecture. - Sleep hygiene should be reviewed to assess factors that may improve sleep quality. - Weight management and regular exercise should be initiated or continued. - Return to Sleep Center for re-evaluation after 6 weeks of therapy  [Electronically signed] 12/21/2021 10:32 AM  Fransico Him MD, ABSM Diplomate, American Board of Sleep Medicine

## 2021-12-25 ENCOUNTER — Telehealth: Payer: Self-pay | Admitting: *Deleted

## 2021-12-25 NOTE — Telephone Encounter (Signed)
The patient has been notified of the result. Left detailed message on voicemail and informed patient to call back..Randi Poullard Green, CMA   

## 2021-12-25 NOTE — Telephone Encounter (Signed)
-----   Message from Lauralee Evener, [place] sent at 12/21/2021  1:14 PM EDT -----  ----- Message ----- From: Sueanne Margarita, MD Sent: 12/21/2021  10:34 AM EDT To: Cv Div Sleep Studies  Please let patient know that they had a successful PAP titration and let DME know that orders are in EPIC.  Please set up 6 week OV with me.

## 2021-12-25 NOTE — Telephone Encounter (Signed)
Return call: Upon patient request DME selection is [facility]. Patient understands he will be contacted by [facility] to set up his cpap. Patient understands to call if [facility] does not contact him with new setup in a timely manner. Patient understands they will be called once confirmation has been received from [employer]/ that they have received their new machine to schedule 10 week follow up appointment.   [facility] notified of new cpap order  Please add to airview Patient was grateful for the call and thanked me.

## 2021-12-30 NOTE — Progress Notes (Signed)
Remote pacemaker transmission.   

## 2021-12-31 ENCOUNTER — Ambulatory Visit (INDEPENDENT_AMBULATORY_CARE_PROVIDER_SITE_OTHER): Payer: Medicare HMO | Admitting: Medical

## 2021-12-31 VITALS — BP 122/68 | HR 72 | Temp 98.7°F | Wt 236.8 lb

## 2021-12-31 DIAGNOSIS — M79644 Pain in right finger(s): Secondary | ICD-10-CM | POA: Diagnosis not present

## 2021-12-31 DIAGNOSIS — T148XXA Other injury of unspecified body region, initial encounter: Secondary | ICD-10-CM | POA: Diagnosis not present

## 2021-12-31 DIAGNOSIS — Z79899 Other long term (current) drug therapy: Secondary | ICD-10-CM | POA: Diagnosis not present

## 2021-12-31 DIAGNOSIS — Z23 Encounter for immunization: Secondary | ICD-10-CM

## 2021-12-31 DIAGNOSIS — D509 Iron deficiency anemia, unspecified: Secondary | ICD-10-CM | POA: Diagnosis not present

## 2021-12-31 LAB — CBC
Hematocrit: 34.8 % — ABNORMAL LOW (ref 37.5–51.0)
Hemoglobin: 10.8 g/dL — ABNORMAL LOW (ref 13.0–17.7)
MCH: 21.7 pg — ABNORMAL LOW (ref 26.6–33.0)
MCHC: 31 g/dL — ABNORMAL LOW (ref 31.5–35.7)
MCV: 70 fL — ABNORMAL LOW (ref 79–97)
Platelets: 212 10*3/uL (ref 150–450)
RBC: 4.98 x10E6/uL (ref 4.14–5.80)
RDW: 15.8 % — ABNORMAL HIGH (ref 11.6–15.4)
WBC: 5.7 10*3/uL (ref 3.4–10.8)

## 2021-12-31 NOTE — Patient Instructions (Addendum)
Regarding the thumb pain I suspect this is due to inflammation  Recommendations Begin using an over-the-counter thumb spica splint for the next 7 to 10 days Try shifting your hand to a different position to take pressure off the thumb or try using the left arm using a cane for the time being You can use Motrin, Tylenol for pain if needed 2-3 times a day Or you could use Aspercreme topical arthritis cream for the thumb Avoid any anti-inflammatory such as ibuprofen, Aleve, Advil, Motrin given your new medicine Eliquis which is a blood thinner    You have some bruising today that could be due to the blood thinner medicine you started.  If you start seeing other bruising or bleeding, then let your cardiology office note.  If you get a sudden severe headache in the next week or 2 being on this blood thinner then go to the emergency department   We will call with lab results

## 2021-12-31 NOTE — Progress Notes (Signed)
Subjective:  Joel [name] is a 81 y.o. male who presents for Chief Complaint  Patient presents with   Thumb swollen    Thumb swollen for a week or so.  flu shot      Here for right thumb pain.  He has had some pain and swelling of the base of the right thumb for the past week or so.  No history of gout.  No injury, no trauma, no fall.  He does use a cane with the right hand.  He is ambidextrous but uses a cane with the right hand primarily.  Has not tried any treatment for this.  He has some new bruising on his right bicep area without injury.  No other bruising or bleeding  He started a new medicine Eliquis last week with cardiology  No other aggravating or relieving factors.    No other c/o.  The following portions of the patient's history were reviewed and updated as appropriate: allergies, current medications, past family history, past medical history, past social history, past surgical history and problem list.  ROS Otherwise as in subjective above  Objective: BP 122/68   Pulse 72   Temp 98.7 F (37.1 C)   Wt 236 lb 12.8 oz (107.4 kg)   BMI 34.97 kg/m   General appearance: alert, no distress, well developed, well nourished Right thumb base with some tenderness and generalized mild swelling, also some mild swelling of the thumb proximal phalanx but the only tenderness is over the thumb base, he seems to have some difficulty and pain with opposing the other fingers with the thumb.  Otherwise fingers and hand with some general arthritic changes but no other tenderness or swelling.  No discoloration, no warmth or redness Right upper arm over the anterior bicep with a rash 8 area of purplish bruising without induration or warmth   Assessment: Encounter Diagnoses  Name Primary?   Needs flu shot Yes   Bruising    Pain of right thumb    High risk medication use      Plan: We discussed symptoms and concerns.  I suspect some mild inflammation of the thumb from where he  has been using the cane.  Likely not gout.  We discussed other possible differential  We discussed treatment recommendations as below.  We also discussed his new bruising over the right bicep which is likely due to starting the recent Eliquis.  We discussed risk and benefits of medication and watching out for other complications.  Labs today given the new bruising.  Continue Eliquis for now.    Patient Instructions  Regarding the thumb pain I suspect this is due to inflammation  Recommendations Begin using an over-the-counter thumb spica splint for the next 7 to 10 days Try shifting your hand to a different position to take pressure off the thumb or try using the left arm using a cane for the time being You can use Motrin, Tylenol for pain if needed 2-3 times a day Or you could use Aspercreme topical arthritis cream for the thumb Avoid any anti-inflammatory such as ibuprofen, Aleve, Advil, Motrin given your new medicine Eliquis which is a blood thinner    You have some bruising today that could be due to the blood thinner medicine you started.  If you start seeing other bruising or bleeding, then let your cardiology office note.  If you get a sudden severe headache in the next week or 2 being on this blood thinner then go to  the emergency department   We will call with lab results    Counseled on the influenza virus vaccine.  Vaccine information sheet given.   High dose Influenza vaccine given after consent obtained.   Suzanne was seen today for thumb swollen.  Diagnoses and all orders for this visit:  Needs flu shot -     Flu Vaccine QUAD High Dose(Fluad)  Bruising -     CBC -     PT and PTT; Future -     PT and PTT  Pain of right thumb  High risk medication use -     CBC -     PT and PTT; Future -     PT and PTT    Follow up: pending labs

## 2022-01-01 ENCOUNTER — Other Ambulatory Visit: Payer: Self-pay | Admitting: Medical

## 2022-01-01 DIAGNOSIS — D509 Iron deficiency anemia, unspecified: Secondary | ICD-10-CM

## 2022-01-01 LAB — PT AND PTT
INR: 1.1 (ref 0.9–1.2)
Prothrombin Time: 11.4 s (ref 9.1–12.0)
aPTT: 34 s — ABNORMAL HIGH (ref 24–33)

## 2022-01-01 NOTE — Progress Notes (Signed)
Iron

## 2022-01-02 ENCOUNTER — Other Ambulatory Visit: Payer: Self-pay | Admitting: Medical

## 2022-01-02 LAB — IRON: Iron: 37 ug/dL — ABNORMAL LOW (ref 38–169)

## 2022-01-02 MED ORDER — FERROUS GLUCONATE 324 (38 FE) MG PO TABS: 324.0000 mg | ORAL_TABLET | Freq: Every day | ORAL | 0 refills | Status: DC

## 2022-01-04 ENCOUNTER — Other Ambulatory Visit: Payer: Self-pay | Admitting: Internal Medicine

## 2022-01-04 DIAGNOSIS — D509 Iron deficiency anemia, unspecified: Secondary | ICD-10-CM

## 2022-01-07 ENCOUNTER — Telehealth: Payer: Self-pay | Admitting: Internal Medicine

## 2022-01-07 DIAGNOSIS — K921 Melena: Secondary | ICD-10-CM

## 2022-01-07 NOTE — Telephone Encounter (Signed)
Spoke with patient who reports 3 days of bright red blood in stools. Patient denies SOB, lightheadedness or feeling weak. He was started on Eliquis '5mg'$  BID on 12/17/2021.  Reviewed with Dr. Burt Knack and advised patient to hold Eliquis. Come to office next day for CBC and call his PCP for appointment for evaluation.  Patient verbalized understanding.

## 2022-01-07 NOTE — Telephone Encounter (Signed)
Pt c/o medication issue:  1. Name of Medication: Eliquis  2. How are you currently taking this medication (dosage and times per day)? 2 times a day  3. Are you having a reaction (difficulty breathing--STAT)?   4. What is your medication issue? Stools are bright red with blood for the last 3 days

## 2022-01-08 ENCOUNTER — Ambulatory Visit: Payer: Medicare HMO | Attending: Cardiovascular Disease

## 2022-01-08 DIAGNOSIS — K921 Melena: Secondary | ICD-10-CM

## 2022-01-09 LAB — CBC
Hematocrit: 31.8 % — ABNORMAL LOW (ref 37.5–51.0)
Hemoglobin: 9.5 g/dL — ABNORMAL LOW (ref 13.0–17.7)
MCH: 21.4 pg — ABNORMAL LOW (ref 26.6–33.0)
MCHC: 29.9 g/dL — ABNORMAL LOW (ref 31.5–35.7)
MCV: 72 fL — ABNORMAL LOW (ref 79–97)
Platelets: 205 10*3/uL (ref 150–450)
RBC: 4.43 x10E6/uL (ref 4.14–5.80)
RDW: 15.8 % — ABNORMAL HIGH (ref 11.6–15.4)
WBC: 6 10*3/uL (ref 3.4–10.8)

## 2022-01-11 ENCOUNTER — Ambulatory Visit (INDEPENDENT_AMBULATORY_CARE_PROVIDER_SITE_OTHER): Payer: Medicare HMO | Admitting: Medical

## 2022-01-11 VITALS — BP 120/68 | HR 68 | Temp 97.8°F | Wt 239.8 lb

## 2022-01-11 DIAGNOSIS — D638 Anemia in other chronic diseases classified elsewhere: Secondary | ICD-10-CM

## 2022-01-11 DIAGNOSIS — T148XXA Other injury of unspecified body region, initial encounter: Secondary | ICD-10-CM

## 2022-01-11 DIAGNOSIS — K921 Melena: Secondary | ICD-10-CM | POA: Diagnosis not present

## 2022-01-11 DIAGNOSIS — Z79899 Other long term (current) drug therapy: Secondary | ICD-10-CM | POA: Diagnosis not present

## 2022-01-11 DIAGNOSIS — I2510 Atherosclerotic heart disease of native coronary artery without angina pectoris: Secondary | ICD-10-CM | POA: Diagnosis not present

## 2022-01-11 DIAGNOSIS — Z789 Other specified health status: Secondary | ICD-10-CM | POA: Diagnosis not present

## 2022-01-11 DIAGNOSIS — Z950 Presence of cardiac pacemaker: Secondary | ICD-10-CM | POA: Diagnosis not present

## 2022-01-11 DIAGNOSIS — I459 Conduction disorder, unspecified: Secondary | ICD-10-CM | POA: Diagnosis not present

## 2022-01-11 DIAGNOSIS — I739 Peripheral vascular disease, unspecified: Secondary | ICD-10-CM | POA: Diagnosis not present

## 2022-01-11 MED ORDER — ASPIRIN 325 MG PO TBEC: 325.0000 mg | DELAYED_RELEASE_TABLET | Freq: Every day | ORAL | 0 refills | Status: DC

## 2022-01-11 MED ORDER — RIVAROXABAN 20 MG PO TABS: 20.0000 mg | ORAL_TABLET | Freq: Every day | ORAL | 0 refills | Status: DC

## 2022-01-11 MED ORDER — TANDEM PLUS 162-115.2-1 MG PO CAPS: 1.0000 | ORAL_CAPSULE | Freq: Every day | ORAL | 0 refills | Status: DC

## 2022-01-11 NOTE — Progress Notes (Signed)
Subjective:  Joel [name] is a 81 y.o. male who presents for Chief Complaint  Patient presents with   Blood In Stools    Taking eliquis blood in stool and Dr. Lovena Le advised to stop taking it and hasn't heard back. Been off since Friday. Has since cleared up.      Here for f/u on blood in stool.  He was here December 31, 2021 for thumb pain.  At that visit he had a new triangular bruise on his right upper arm out of the blue not from any injury.  He had recently been started on Eliquis by cardiology.  Prior to Eliquis he was on aspirin 81 mg daily.  Since his visit here last, he had some bright red blood in the stool including this past week.Marland Kitchen  He called cardiology and they discontinued Eliquis and have him follow-up today here.  Since stopping the Eliquis the blood in the stool has resolved.  This morning he had brown stool without any sign of bleeding on the toilet paper.  He stopped the blood thinner Friday 3 days ago, Eliquis.  He has a history of anemia and usually takes iron every other day.  If he takes it daily makes him constipated.  No other new bruising or bleeding.  Prior anticoagulant/blood thinners include aspirin and Eliquis  No other aggravating or relieving factors.    No other c/o.  Past Medical History:  Diagnosis Date   Atypical chest pain    CAD (coronary artery disease)    a. Multiple prior evaluations then 05/2012 s/p BMS to OM2. b. Nuc 11/2012 wnl. c. Cath 2015: patent stent, moderate LAD/RCA dz, treated medically.   Cataract    CHEST PAIN UNSPECIFIED 09/13/2008   Qualifier: Diagnosis of  By: Rose Fillers, RN, Heather     CKD (chronic kidney disease), stage II    COPD (chronic obstructive pulmonary disease) (HCC)    Cough secondary to angiotensin converting enzyme inhibitor (ACE-I)    Diabetes mellitus ([place]) 09/07/2019   Diabetes mellitus without complication (HCC)    Diverticulosis    DVT (deep venous thrombosis) (HCC)    Family history of breast cancer in first  degree relative    GERD (gastroesophageal reflux disease)    Headache(784.0)    Hx: of   History of blood transfusion    "I've had 2; don't know what it was related to" (11/09/2012)   HLD (hyperlipidemia)    HTN (hypertension)    Iron deficiency anemia    Lymphoproliferative disorder, low grade B cell ([place]) 05/21/2020   Microcytic anemia    Peripheral vascular disease ([place])    a. L pop-tibial bypass 2011, followed by [facility].   Personal history of prostate cancer    Rotator cuff injury    "left arm; never repaired" (11/09/2012)   Current Outpatient Medications on File Prior to Visit  Medication Sig Dispense Refill   abiraterone acetate (ZYTIGA) 250 MG tablet Take 1,000 mg by mouth daily.     Accu-Chek FastClix Lancets MISC Test once daily 102 each 12   albuterol (VENTOLIN HFA) 108 (90 Base) MCG/ACT inhaler INHALE 2 PUFFS INTO THE LUNGS EVERY 6 HOURS AS NEEDED FOR WHEEZING OR SHORTNESS OF BREATH 54 g 0   amLODipine (NORVASC) 10 MG tablet TAKE 1 TABLET(10 MG) BY MOUTH DAILY 90 tablet 1   brimonidine (ALPHAGAN) 0.2 % ophthalmic solution Place 1 drop into both eyes 2 (two) times daily.     carboxymethylcellul-glycerin (REFRESH RELIEVA) 0.5-0.9 % ophthalmic solution  Place 1 drop into both eyes daily as needed for dry eyes.     diclofenac Sodium (VOLTAREN) 1 % GEL Apply 2 g topically 2 (two) times daily as needed (back pain).     ferrous gluconate (FERGON) 324 MG tablet Take 1 tablet (324 mg total) by mouth daily with breakfast. 90 tablet 0   gabapentin (NEURONTIN) 600 MG tablet Take 600 mg by mouth 2 (two) times daily.     isosorbide mononitrate (IMDUR) 120 MG 24 hr tablet Take 1 tablet (120 mg total) by mouth daily. 90 tablet 1   latanoprost (XALATAN) 0.005 % ophthalmic solution Place 1 drop into both eyes at bedtime.     loratadine-pseudoephedrine (CLARITIN-D 24-HOUR) 10-240 MG 24 hr tablet Take 1 tablet by mouth daily as needed for allergies.     nitroGLYCERIN (NITROSTAT) 0.4 MG SL tablet Place  1 tablet (0.4 mg total) under the tongue every 5 (five) minutes as needed for chest pain (max 3 doses). 25 tablet 3   omeprazole (PRILOSEC) 20 MG capsule Take 20 mg by mouth daily.     pravastatin (PRAVACHOL) 40 MG tablet TAKE 1 TABLET(40 MG) BY MOUTH EVERY EVENING 90 tablet 3   predniSONE (DELTASONE) 5 MG tablet Take 5 mg by mouth daily.     tamsulosin (FLOMAX) 0.4 MG CAPS capsule Take 0.4 mg by mouth at bedtime.     traMADol (ULTRAM) 50 MG tablet Take 1 tablet (50 mg total) by mouth every 12 (twelve) hours as needed. 30 tablet 0   apixaban (ELIQUIS) 5 MG TABS tablet Take 1 tablet (5 mg total) by mouth 2 (two) times daily. (Patient not taking: Reported on 01/11/2022) 60 tablet 5   No current facility-administered medications on file prior to visit.     The following portions of the patient's history were reviewed and updated as appropriate: allergies, current medications, past family history, past medical history, past social history, past surgical history and problem list.  ROS Otherwise as in subjective above    Objective: BP 120/68   Pulse 68   Temp 97.8 F (36.6 C)   Wt 239 lb 12.8 oz (108.8 kg)   BMI 35.41 kg/m   General appearance: alert, no distress, well developed, well nourished Oral: MMM, no lesions, no bleeding or bruising Skin without bruising or bleeding today Rectal exam declined   Assessment: Encounter Diagnoses  Name Primary?   Blood in stool Yes   High risk medication use    Anticoagulant not tolerated    Bruising    Coronary artery disease involving native coronary artery of native heart without angina pectoris    Pacemaker    PVD (peripheral vascular disease) with claudication (HCC)    Heart block    Anemia of chronic disease      Plan: History of anemia-looking back he has run anywhere from 9-11 hemoglobin of the last 2 years.  I reviewed EGD and colonoscopy report from 2019.  At that time upper GI was normal but there was some polyps in the  colon.  No bleeding.  He was advised to 5-year repeat.  He will be due back for repeat colonoscopy next year in 2024 last he continues to get blood in the stool or bleeding now  Given his recent labs and bleeding I suspect this is Eliquis related.  We discussed the need for anticoagulation versus risk of medication.  We discussed options including aspirin, Xarelto, Coumadin or other.  For now his blood in the stool seems to  have resolved  Advised if any more blood in the stool or bleeding or bruising in the next few weeks he will need to go and have repeat colonoscopy more than likely.    Patient Instructions  Recommendations: Begin Aspirin '325mg'$  this week daily at bedtime. If no additional bleeding, bruising or blood in stool this week, then begin Xarelto at bedtime Friday (samples given).   Stop Aspirin if you begin Xarelto Friday Collect stool cards Friday morning and bring this in so we can check for blood.   The goal is to be free of any bleeding before starting Xarelto I also want to recheck your hemoglobin Friday, so I'll put in a lab order for this. Begin new version of iron called iron polysaccharide or Tandem instead of your every other day iron You have been anemic for a while.  You are due for repeat colonoscopy next spring.  However, if you get any more blood in stool in the meantime, I will refer you back to gastroenterology sooner         Jhonnie was seen today for blood in stools.  Diagnoses and all orders for this visit:  Blood in stool -     Hemoglobin; Future  High risk medication use  Anticoagulant not tolerated  Bruising  Coronary artery disease involving native coronary artery of native heart without angina pectoris  Pacemaker  PVD (peripheral vascular disease) with claudication (HCC)  Heart block  Anemia of chronic disease -     Hemoglobin; Future  Other orders -     FeFum-FePo-FA-B Cmp-C-Zn-Mn-Cu (TANDEM PLUS) 162-115.2-1 MG CAPS; Take 1  tablet by mouth daily. -     rivaroxaban (XARELTO) 20 MG TABS tablet; Take 1 tablet (20 mg total) by mouth daily with supper. -     aspirin EC 325 MG tablet; Take 1 tablet (325 mg total) by mouth daily.    Follow up: return this week Friday.

## 2022-01-11 NOTE — Patient Instructions (Addendum)
Recommendations: Begin Aspirin '325mg'$  this week daily at bedtime. If no additional bleeding, bruising or blood in stool this week, then begin Xarelto at bedtime Friday (samples given).   Stop Aspirin if you begin Xarelto Friday Collect stool cards Friday morning and bring this in so we can check for blood.   The goal is to be free of any bleeding before starting Xarelto I also want to recheck your hemoglobin Friday, so I'll put in a lab order for this. Begin new version of iron called iron polysaccharide or Tandem instead of your every other day iron You have been anemic for a while.  You are due for repeat colonoscopy next spring.  However, if you get any more blood in stool in the meantime, I will refer you back to gastroenterology sooner

## 2022-01-13 DIAGNOSIS — G518 Other disorders of facial nerve: Secondary | ICD-10-CM | POA: Diagnosis not present

## 2022-01-13 DIAGNOSIS — M542 Cervicalgia: Secondary | ICD-10-CM | POA: Diagnosis not present

## 2022-01-13 DIAGNOSIS — M791 Myalgia, unspecified site: Secondary | ICD-10-CM | POA: Diagnosis not present

## 2022-01-13 DIAGNOSIS — R519 Headache, unspecified: Secondary | ICD-10-CM | POA: Diagnosis not present

## 2022-01-14 ENCOUNTER — Encounter: Payer: Self-pay | Admitting: Internal Medicine

## 2022-01-15 ENCOUNTER — Other Ambulatory Visit (INDEPENDENT_AMBULATORY_CARE_PROVIDER_SITE_OTHER): Payer: Medicare HMO

## 2022-01-15 DIAGNOSIS — K921 Melena: Secondary | ICD-10-CM

## 2022-01-15 LAB — POC HEMOCCULT BLD/STL (HOME/3-CARD/SCREEN)
Card #2 Fecal Occult Blod, POC: NEGATIVE
Card #3 Fecal Occult Blood, POC: NEGATIVE
Fecal Occult Blood, POC: NEGATIVE

## 2022-01-15 NOTE — Telephone Encounter (Signed)
Tried calling Pt and daughter to make them aware of Dr. Tanna Furry orders to discontinue the Pt's Eliquis prescription.  Follow up required to d/c Eliquis.    Dr. Lovena Le wants to schedule a follow up appointment with this patient.  Will send a message to scheduling.

## 2022-01-22 ENCOUNTER — Ambulatory Visit (INDEPENDENT_AMBULATORY_CARE_PROVIDER_SITE_OTHER): Payer: Medicare HMO | Admitting: Medical

## 2022-01-22 ENCOUNTER — Encounter: Payer: Self-pay | Admitting: Medical

## 2022-01-22 VITALS — BP 124/78 | HR 78 | Temp 98.5°F | Wt 242.2 lb

## 2022-01-22 DIAGNOSIS — D649 Anemia, unspecified: Secondary | ICD-10-CM | POA: Diagnosis not present

## 2022-01-22 DIAGNOSIS — Z5181 Encounter for therapeutic drug level monitoring: Secondary | ICD-10-CM | POA: Diagnosis not present

## 2022-01-22 DIAGNOSIS — I2510 Atherosclerotic heart disease of native coronary artery without angina pectoris: Secondary | ICD-10-CM | POA: Diagnosis not present

## 2022-01-22 DIAGNOSIS — T148XXA Other injury of unspecified body region, initial encounter: Secondary | ICD-10-CM

## 2022-01-22 DIAGNOSIS — D696 Thrombocytopenia, unspecified: Secondary | ICD-10-CM | POA: Diagnosis not present

## 2022-01-22 DIAGNOSIS — K5900 Constipation, unspecified: Secondary | ICD-10-CM

## 2022-01-22 DIAGNOSIS — Z950 Presence of cardiac pacemaker: Secondary | ICD-10-CM | POA: Diagnosis not present

## 2022-01-22 DIAGNOSIS — I10 Essential (primary) hypertension: Secondary | ICD-10-CM

## 2022-01-22 DIAGNOSIS — R143 Flatulence: Secondary | ICD-10-CM | POA: Insufficient documentation

## 2022-01-22 NOTE — Patient Instructions (Signed)
Since you have had no more bleeding since stopping Eliquis, I recommend you continue aspirin until you get your labs back.  The plan tentatively will be to start the Xarelto blood thinner but I will make sure I see your lab before we do this.  So continue aspirin for now  If you do end up starting the Xarelto we will have to watch for bleeding and bruising again  We will coordinat your medicaiton for blood thinner with cardioloy   Constipation Drink at 60 - 100 ounces of water daily Continue fiber supplement daily such as Metamucil or FiberCon Consider taking MiraLAX 1 capful daily in a beverage to help with bloating, gas and constipation If not improving in the next 2 weeks let me know and we can consider other treatment   Anemia, related to chronic disease particularly We will recheck your blood counts today Continue iron supplement daily

## 2022-01-22 NOTE — Progress Notes (Unsigned)
Subjective: Chief Complaint  Patient presents with   other    Discuss meds and hemoglobin recheck    Medical team: Dr. Lauree Chandler, cardiology Dr. Alver Sorrow, hematology /oncology Tye Savoy, NP and Dr. Silvano Rusk, GI Dr. Daylene Katayama, podiatry Girtha Rm, NP-C here for primary care  Here for recheck on hemoglobin bleeding and some concerns about constipation.  He notes that sometimes his abdomen feels tight, feels like he needs to go to the bathroom often.  He gets a lot of gas, has a bowel movement daily.  Sometimes he has a bowel movement twice daily and urged to go other times.  Typically needs bacon eggs and toast in the morning, a salad at lunch usually fast food and eats a lot of salads at night.  He does eat cheeseburgers and hotdogs somewhat regularly.  He does do a fiber supplement.  He does not eat a lot of onions remains.  He thinks he needs to see gastro enterology  Here to recheck on bleeding, anemia, medication.  He was on Eliquis recently but we had to discontinue this due to acute bleeding of the arm skin and bright red blood in the stool recently.  After he stopped Eliquis the bleeding and bruising went away.  He has been doing aspirin since then.  We were talking about starting Xarelto.  He is here to recheck on bleeding and recheck blood counts today because he has been anemic.    Past Medical History:  Diagnosis Date   Atypical chest pain    CAD (coronary artery disease)    a. Multiple prior evaluations then 05/2012 s/p BMS to OM2. b. Nuc 11/2012 wnl. c. Cath 2015: patent stent, moderate LAD/RCA dz, treated medically.   Cataract    CHEST PAIN UNSPECIFIED 09/13/2008   Qualifier: Diagnosis of  By: Rose Fillers, RN, Heather     CKD (chronic kidney disease), stage II    COPD (chronic obstructive pulmonary disease) (HCC)    Cough secondary to angiotensin converting enzyme inhibitor (ACE-I)    Diabetes mellitus ([place]) 09/07/2019   Diabetes mellitus without  complication (HCC)    Diverticulosis    DVT (deep venous thrombosis) (HCC)    Family history of breast cancer in first degree relative    GERD (gastroesophageal reflux disease)    Headache(784.0)    Hx: of   History of blood transfusion    "I've had 2; don't know what it was related to" (11/09/2012)   HLD (hyperlipidemia)    HTN (hypertension)    Iron deficiency anemia    Lymphoproliferative disorder, low grade B cell ([place]) 05/21/2020   Microcytic anemia    Peripheral vascular disease ([place])    a. L pop-tibial bypass 2011, followed by [facility].   Personal history of prostate cancer    Rotator cuff injury    "left arm; never repaired" (11/09/2012)   Current Outpatient Medications on File Prior to Visit  Medication Sig Dispense Refill   abiraterone acetate (ZYTIGA) 250 MG tablet Take 1,000 mg by mouth daily.     Accu-Chek FastClix Lancets MISC Test once daily 102 each 12   albuterol (VENTOLIN HFA) 108 (90 Base) MCG/ACT inhaler INHALE 2 PUFFS INTO THE LUNGS EVERY 6 HOURS AS NEEDED FOR WHEEZING OR SHORTNESS OF BREATH 54 g 0   amLODipine (NORVASC) 10 MG tablet TAKE 1 TABLET(10 MG) BY MOUTH DAILY 90 tablet 1   aspirin EC 325 MG tablet Take 1 tablet (325 mg total) by mouth daily. 30 tablet  0   brimonidine (ALPHAGAN) 0.2 % ophthalmic solution Place 1 drop into both eyes 2 (two) times daily.     carboxymethylcellul-glycerin (REFRESH RELIEVA) 0.5-0.9 % ophthalmic solution Place 1 drop into both eyes daily as needed for dry eyes.     diclofenac Sodium (VOLTAREN) 1 % GEL Apply 2 g topically 2 (two) times daily as needed (back pain).     FeFum-FePo-FA-B Cmp-C-Zn-Mn-Cu (TANDEM PLUS) 162-115.2-1 MG CAPS Take 1 tablet by mouth daily. 30 capsule 0   ferrous gluconate (FERGON) 324 MG tablet Take 1 tablet (324 mg total) by mouth daily with breakfast. 90 tablet 0   gabapentin (NEURONTIN) 600 MG tablet Take 600 mg by mouth 2 (two) times daily.     isosorbide mononitrate (IMDUR) 120 MG 24 hr tablet Take 1 tablet  (120 mg total) by mouth daily. 90 tablet 1   latanoprost (XALATAN) 0.005 % ophthalmic solution Place 1 drop into both eyes at bedtime.     loratadine-pseudoephedrine (CLARITIN-D 24-HOUR) 10-240 MG 24 hr tablet Take 1 tablet by mouth daily as needed for allergies.     nitroGLYCERIN (NITROSTAT) 0.4 MG SL tablet Place 1 tablet (0.4 mg total) under the tongue every 5 (five) minutes as needed for chest pain (max 3 doses). 25 tablet 3   omeprazole (PRILOSEC) 20 MG capsule Take 20 mg by mouth daily.     pravastatin (PRAVACHOL) 40 MG tablet TAKE 1 TABLET(40 MG) BY MOUTH EVERY EVENING 90 tablet 3   predniSONE (DELTASONE) 5 MG tablet Take 5 mg by mouth daily.     apixaban (ELIQUIS) 5 MG TABS tablet Take 1 tablet (5 mg total) by mouth 2 (two) times daily. (Patient not taking: Reported on 01/22/2022) 60 tablet 5   rivaroxaban (XARELTO) 20 MG TABS tablet Take 1 tablet (20 mg total) by mouth daily with supper. (Patient not taking: Reported on 01/22/2022) 14 tablet 0   tamsulosin (FLOMAX) 0.4 MG CAPS capsule Take 0.4 mg by mouth at bedtime. (Patient not taking: Reported on 01/22/2022)     traMADol (ULTRAM) 50 MG tablet Take 1 tablet (50 mg total) by mouth every 12 (twelve) hours as needed. (Patient not taking: Reported on 01/22/2022) 30 tablet 0   No current facility-administered medications on file prior to visit.   ROS as in subjective   Objective: BP 124/78   Pulse 78   Temp 98.5 F (36.9 C)   Wt 242 lb 3.2 oz (109.9 kg)   BMI 35.77 kg/m   Gen:wd, wn nad No obvoius bruising or bleeding today Abodmen + bs, soft, nontender, no mass, no organmeagly Ext: no edema    Assessment: Encounter Diagnoses  Name Primary?   Anemia, unspecified type Yes   Thrombocytopenia ([place])    Coronary artery disease involving native coronary artery of native heart without angina pectoris    Encounter for therapeutic drug level monitoring    Essential hypertension    Pacemaker    Bruising    Constipation,  unspecified constipation type    Flatulence     Plan: Anemia-recheck blood counts today, likely anemic of chronic disease.  He is taking iron supplement as he has had some low iron.  I reviewed his colonoscopy from 2019 in the chart record showing mild diverticulosis and 2 polyps.  He has not had a lot of blood in the stool until recently being on the Eliquis.Marland Kitchen  His iron was low in September so we started him on supplement which she is taking.  We will check a  folate and B12 today.  Pending labs likely referral back to GI  Constipation, flatulence-I gave him samples of MiraLAX to try, continue fiber supplement daily, work on good water and fiber intake.  Recheck in a month's, consider other remedies if this is not working  Hypertension-continue current medication  Anticoagulation was initiated with cardiology recently with Eliquis.  He was having bleeding and bruising not long after initiating the medication.  He is also anemic.  Since stopping Eliquis recently the bleeding and bruising resolved fairly quickly.  He is taking 325 mg aspirin currently.  Labs today.  If anemia is stable we discussed trial of Xarelto.  He may or may not tolerate that but we will have to wait and see  Brewer was seen today for other.  Diagnoses and all orders for this visit:  Anemia, unspecified type -     CBC -     Vitamin B12 -     Folate  Thrombocytopenia ([place])  Coronary artery disease involving native coronary artery of native heart without angina pectoris  Encounter for therapeutic drug level monitoring  Essential hypertension  Pacemaker  Bruising  Constipation, unspecified constipation type  Flatulence  Other orders -     polyethylene glycol powder (GLYCOLAX/MIRALAX) 17 GM/SCOOP powder; Take 17 g by mouth daily.   F/u pending labs

## 2022-01-23 LAB — CBC
Hematocrit: 31.3 % — ABNORMAL LOW (ref 37.5–51.0)
Hemoglobin: 9.9 g/dL — ABNORMAL LOW (ref 13.0–17.7)
MCH: 22.4 pg — ABNORMAL LOW (ref 26.6–33.0)
MCHC: 31.6 g/dL (ref 31.5–35.7)
MCV: 71 fL — ABNORMAL LOW (ref 79–97)
Platelets: 195 10*3/uL (ref 150–450)
RBC: 4.41 x10E6/uL (ref 4.14–5.80)
RDW: 16.1 % — ABNORMAL HIGH (ref 11.6–15.4)
WBC: 5.3 10*3/uL (ref 3.4–10.8)

## 2022-01-23 LAB — VITAMIN B12: Vitamin B-12: 1010 pg/mL (ref 232–1245)

## 2022-01-23 LAB — FOLATE: Folate: 7.7 ng/mL (ref >3.0)

## 2022-01-25 ENCOUNTER — Telehealth: Payer: Self-pay

## 2022-01-25 ENCOUNTER — Other Ambulatory Visit: Payer: Self-pay | Admitting: Medical

## 2022-01-25 MED ORDER — RIVAROXABAN 10 MG PO TABS: 10.0000 mg | ORAL_TABLET | Freq: Every day | ORAL | 1 refills | Status: DC

## 2022-01-25 MED ORDER — POLYETHYLENE GLYCOL 3350 17 GM/SCOOP PO POWD: 17.0000 g | Freq: Every day | ORAL | 0 refills | Status: DC

## 2022-01-25 NOTE — Telephone Encounter (Signed)
-----   Message from Tor Netters, RN sent at 01/11/2022 12:47 PM EDT ----- Regarding: Eliquis held Patient is schedule to see his PCP today. Needs to follow up with Dr. Lovena Le. Thanks  [employer] ----- Message ----- From: Sherren Mocha, MD Sent: 01/11/2022   6:32 AM EDT To: Evans Lance, MD; Tor Netters, RN  Referred to PCP for further evaluation. Eliquis held. Follow-up Dr Lovena Le.

## 2022-01-25 NOTE — Progress Notes (Signed)
Pt is agreeable to referral to GI

## 2022-01-25 NOTE — Addendum Note (Signed)
Addended by: Minette Headland A on: 01/25/2022 03:02 PM   Modules accepted: Orders

## 2022-01-25 NOTE — Progress Notes (Signed)
Left message for pt to call me back 

## 2022-01-25 NOTE — Telephone Encounter (Signed)
Pt has upcoming [facility] PPM check with Dr. Cristopher Peru on 10/26;  Will follow up with POC then.

## 2022-02-01 ENCOUNTER — Telehealth: Payer: Self-pay | Admitting: Medical

## 2022-02-01 NOTE — Telephone Encounter (Signed)
Pt called requesting refill on Tramadol 50 mg,   states he lost had it filled in April and he takes it "whenever He has a pain under his heart"   [facility]

## 2022-02-02 ENCOUNTER — Other Ambulatory Visit: Payer: Self-pay | Admitting: Medical

## 2022-02-02 DIAGNOSIS — M545 Low back pain, unspecified: Secondary | ICD-10-CM

## 2022-02-02 MED ORDER — TRAMADOL HCL 50 MG PO TABS: 50.0000 mg | ORAL_TABLET | Freq: Two times a day (BID) | ORAL | 0 refills | Status: DC | PRN

## 2022-02-02 NOTE — Telephone Encounter (Signed)
Pt was notified of results

## 2022-02-04 ENCOUNTER — Ambulatory Visit: Payer: Medicare HMO | Attending: Internal Medicine | Admitting: Internal Medicine

## 2022-02-04 ENCOUNTER — Encounter: Payer: Self-pay | Admitting: Internal Medicine

## 2022-02-04 VITALS — BP 124/62 | HR 67 | Ht 69.0 in | Wt 243.0 lb

## 2022-02-04 DIAGNOSIS — I459 Conduction disorder, unspecified: Secondary | ICD-10-CM

## 2022-02-04 DIAGNOSIS — I2510 Atherosclerotic heart disease of native coronary artery without angina pectoris: Secondary | ICD-10-CM | POA: Diagnosis not present

## 2022-02-04 DIAGNOSIS — Z950 Presence of cardiac pacemaker: Secondary | ICD-10-CM

## 2022-02-04 NOTE — Progress Notes (Signed)
HPI [name] returns today for ongoing evaluation of transient CHB. He is a pleasant 81 yo man with a h/o HTN, PAD, CAD, and has undergone cardiac monitoring which has demonstrated transient daytime heart block with 4 second pauses. He is s/p PPM insertion and was then found to have PAF. In addition he has non-sustained VT. He is on no AV or sinus nodal blocking drugs. He was started on systemic anti-coagulation and then developed GI bleeding with bright red blood. The bleeding resolved with cessation of systemic anti-coagulation. He otherwise feels better.  Allergies  Allergen Reactions   Topiramate Shortness Of Breath and Other (See Comments)     leg cramps   Atenolol Other (See Comments)    bradycardia   Lisinopril Cough   Tramadol-Acetaminophen Cough    Tolerates plain Tramadol     Current Outpatient Medications  Medication Sig Dispense Refill   abiraterone acetate (ZYTIGA) 250 MG tablet Take 1,000 mg by mouth daily.     Accu-Chek FastClix Lancets MISC Test once daily 102 each 12   albuterol (VENTOLIN HFA) 108 (90 Base) MCG/ACT inhaler INHALE 2 PUFFS INTO THE LUNGS EVERY 6 HOURS AS NEEDED FOR WHEEZING OR SHORTNESS OF BREATH 54 g 0   amLODipine (NORVASC) 10 MG tablet TAKE 1 TABLET(10 MG) BY MOUTH DAILY 90 tablet 1   brimonidine (ALPHAGAN) 0.2 % ophthalmic solution Place 1 drop into both eyes 2 (two) times daily.     carboxymethylcellul-glycerin (REFRESH RELIEVA) 0.5-0.9 % ophthalmic solution Place 1 drop into both eyes daily as needed for dry eyes.     diclofenac Sodium (VOLTAREN) 1 % GEL Apply 2 g topically 2 (two) times daily as needed (back pain).     gabapentin (NEURONTIN) 600 MG tablet Take 600 mg by mouth 2 (two) times daily.     isosorbide mononitrate (IMDUR) 120 MG 24 hr tablet Take 1 tablet (120 mg total) by mouth daily. 90 tablet 1   latanoprost (XALATAN) 0.005 % ophthalmic solution Place 1 drop into both eyes at bedtime.     loratadine-pseudoephedrine  (CLARITIN-D 24-HOUR) 10-240 MG 24 hr tablet Take 1 tablet by mouth daily as needed for allergies.     Multiple Vitamins-Minerals (CENTRUM SILVER PO) Take by mouth.     nitroGLYCERIN (NITROSTAT) 0.4 MG SL tablet Place 1 tablet (0.4 mg total) under the tongue every 5 (five) minutes as needed for chest pain (max 3 doses). 25 tablet 3   omeprazole (PRILOSEC) 20 MG capsule Take 20 mg by mouth daily.     polyethylene glycol powder (GLYCOLAX/MIRALAX) 17 GM/SCOOP powder Take 17 g by mouth daily. 289 g 0   pravastatin (PRAVACHOL) 40 MG tablet TAKE 1 TABLET(40 MG) BY MOUTH EVERY EVENING 90 tablet 3   predniSONE (DELTASONE) 5 MG tablet Take 5 mg by mouth daily.     rivaroxaban (XARELTO) 10 MG TABS tablet Take 1 tablet (10 mg total) by mouth daily. 30 tablet 1   TAMSULOSIN HCL PO Take 10 mg by mouth daily.     traMADol (ULTRAM) 50 MG tablet Take 1 tablet (50 mg total) by mouth every 12 (twelve) hours as needed. 30 tablet 0   No current facility-administered medications for this visit.     Past Medical History:  Diagnosis Date   Atypical chest pain    CAD (coronary artery disease)    a. Multiple prior evaluations then 05/2012 s/p BMS to OM2. b. Nuc 11/2012 wnl. c. Cath 2015: patent stent, moderate LAD/RCA dz,  treated medically.   Cataract    CHEST PAIN UNSPECIFIED 09/13/2008   Qualifier: Diagnosis of  By: Rose Fillers, RN, Heather     CKD (chronic kidney disease), stage II    COPD (chronic obstructive pulmonary disease) (HCC)    Cough secondary to angiotensin converting enzyme inhibitor (ACE-I)    Diabetes mellitus ([place]) 09/07/2019   Diabetes mellitus without complication (HCC)    Diverticulosis    DVT (deep venous thrombosis) (HCC)    Family history of breast cancer in first degree relative    GERD (gastroesophageal reflux disease)    Headache(784.0)    Hx: of   History of blood transfusion    "I've had 2; don't know what it was related to" (11/09/2012)   HLD (hyperlipidemia)    HTN (hypertension)     Iron deficiency anemia    Lymphoproliferative disorder, low grade B cell ([place]) 05/21/2020   Microcytic anemia    Peripheral vascular disease ([place])    a. L pop-tibial bypass 2011, followed by [facility].   Personal history of prostate cancer    Rotator cuff injury    "left arm; never repaired" (11/09/2012)    ROS:   All systems reviewed and negative except as noted in the HPI.   Past Surgical History:  Procedure Laterality Date   CARDIOVASCULAR STRESS TEST  Aug. 2014   CATARACT EXTRACTION W/ INTRAOCULAR LENS IMPLANT Right ~ 2008   CIRCUMCISION     COLONOSCOPY     CORONARY ANGIOPLASTY WITH STENT PLACEMENT  2014   "1" (11/09/2012)   ILIAC ARTERY ANEURYSM REPAIR     LEFT HEART CATH  05-31-12   LEFT HEART CATH AND CORONARY ANGIOGRAPHY N/A 08/01/2019   Procedure: LEFT HEART CATH AND CORONARY ANGIOGRAPHY;  Surgeon: Burnell Blanks, MD;  Location: [facility];  Service: Cardiovascular;  Laterality: N/A;   LEFT HEART CATHETERIZATION WITH CORONARY ANGIOGRAM N/A 06/01/2011   Procedure: LEFT HEART CATHETERIZATION WITH CORONARY ANGIOGRAM;  Surgeon: Hillary Bow, MD;  Location: [facility] CATH LAB;  Service: Cardiovascular;  Laterality: N/A;   LEFT HEART CATHETERIZATION WITH CORONARY ANGIOGRAM N/A 05/31/2012   Procedure: LEFT HEART CATHETERIZATION WITH CORONARY ANGIOGRAM;  Surgeon: Peter M Martinique, MD;  Location: [facility] CATH LAB;  Service: Cardiovascular;  Laterality: N/A;   LEFT HEART CATHETERIZATION WITH CORONARY ANGIOGRAM N/A 08/01/2013   Procedure: LEFT HEART CATHETERIZATION WITH CORONARY ANGIOGRAM;  Surgeon: Burnell Blanks, MD;  Location: [facility] CATH LAB;  Service: Cardiovascular;  Laterality: N/A;   PACEMAKER IMPLANT N/A 09/03/2021   Procedure: PACEMAKER IMPLANT;  Surgeon: Evans Lance, MD;  Location: [facility];  Service: Cardiovascular;  Laterality: N/A;   PERCUTANEOUS CORONARY STENT INTERVENTION (PCI-S)  05/31/2012   Procedure: PERCUTANEOUS CORONARY STENT INTERVENTION (PCI-S);   Surgeon: Peter M Martinique, MD;  Location: [facility] CATH LAB;  Service: Cardiovascular;;   PR VEIN BYPASS GRAFT,AORTO-FEM-POP Left 10/01/2009   left below knee popliteal artery to posterior tibial artery   SHOULDER ARTHROSCOPY WITH OPEN ROTATOR CUFF REPAIR AND DISTAL CLAVICLE ACROMINECTOMY Left 01/12/2013   Procedure: LEFT SHOULDER ARTHROSCOPY WITH DEBRIDEMENT OPEN DISTAL CLAVICLE RESECTION ,acromioplastyAND ROTATOR CUFF REPAIR;  Surgeon: Yvette Rack., MD;  Location: [place];  Service: Orthopedics;  Laterality: Left;     Family History  Problem Relation Age of Onset   Breast cancer Mother        dx 77s   Hyperlipidemia Mother    Hypertension Mother    COPD Father        deceased  Hyperlipidemia Father    Hypertension Father    Hypertension Sister    Cancer Brother 52       Unknown cancer, possibly stomach   Hypertension Brother    Cancer Brother 97       Unknown, possibly lung   Hypertension Daughter    Hypertension Son    Colon cancer Neg Hx        pt is unclear about family hx     Social History   Socioeconomic History   Marital status: Widowed    Spouse name: Not on file   Number of children: Not on file   Years of education: Not on file   Highest education level: 12th grade  Occupational History   Occupation: Retired  Tobacco Use   Smoking status: Former    Packs/day: 0.50    Years: 52.00    Total pack years: 26.00    Types: Cigarettes    Quit date: 05/26/2011    Years since quitting: 10.7    Passive exposure: Past   Smokeless tobacco: Never  Vaping Use   Vaping Use: Never used  Substance and Sexual Activity   Alcohol use: No   Drug use: No   Sexual activity: Not on file  Other Topics Concern   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  Lives in [place] by himself.      Patient writes with his right hand, though uses his left hand for every thing else. He lives alone in a one level home. He drinks 3 cups of coffee a day  and 3 7-8 oz sodas a day. He does not exercise.   Social Determinants of Health   Financial Resource Strain: Low Risk  (06/11/2021)   Overall Financial Resource Strain (CARDIA)    Difficulty of Paying Living Expenses: Not hard at all  Food Insecurity: No Food Insecurity (05/29/2021)   Hunger Vital Sign    Worried About Running Out of Food in the Last Year: Never true    Ran Out of Food in the Last Year: Never true  Transportation Needs: No Transportation Needs (06/11/2021)   PRAPARE - Hydrologist (Medical): No    Lack of Transportation (Non-Medical): No  Physical Activity: Inactive (05/29/2021)   Exercise Vital Sign    Days of Exercise per Week: 0 days    Minutes of Exercise per Session: 0 min  Stress: No Stress Concern Present (05/29/2021)   [place]    Feeling of Stress : Not at all  Social Connections: Not on file  Intimate Partner Violence: Not on file     BP 124/62   Pulse 67   Ht '5\' 9"'$  (1.753 m)   Wt 243 lb (110.2 kg)   SpO2 93%   BMI 35.88 kg/m   Physical Exam:  Well appearing 81 yo man, looking younger than his stated age,NAD HEENT: Unremarkable Neck:  No JVD, no thyromegally Lymphatics:  No adenopathy Back:  No CVA tenderness Lungs:  Clear with no wheezes HEART:  Regular rate rhythm, no murmurs, no rubs, no clicks Abd:  soft, positive bowel sounds, no organomegally, no rebound, no guarding Ext:  2 plus pulses, no edema, no cyanosis, no clubbing Skin:  No rashes no nodules Neuro:  CN II through XII intact, motor grossly intact  Assess/Plan:   Heart block - he has progressive conduction system disease and is  now s/p PPM insertion. No symptoms. CAD - he denies anginal symptoms.  NSVT - he is symptomatic and hopefully this can be controlled with medical therapy after his PPM is inserted. Atrial fib - he has been found to have PAF.He was started on eliquis and then had  GI bleeding and his [place] has been stopped. He is anemic. I will await his GI workup and then consider Watchman referral.   Salome Spotted

## 2022-02-04 NOTE — Patient Instructions (Addendum)
Medication Instructions:  Your physician recommends that you continue on your current medications as directed. Please refer to the Current Medication list given to you today.  *If you need a refill on your cardiac medications before your next appointment, please call your pharmacy*  Lab Work: None ordered.  If you have labs (blood work) drawn today and your tests are completely normal, you will receive your results only by: [place] (if you have MyChart) OR A paper copy in the mail If you have any lab test that is abnormal or we need to change your treatment, we will call you to review the results.  Testing/Procedures: None ordered.  Follow-Up: At [facility], you and your health needs are our priority.  As part of our continuing mission to provide you with exceptional heart care, we have created designated Provider Care Teams.  These Care Teams include your primary Cardiologist (physician) and Advanced Practice Providers (APPs -  Physician Assistants and Nurse Practitioners) who all work together to provide you with the care you need, when you need it.  We recommend signing up for the patient portal called "MyChart".  Sign up information is provided on this After Visit Summary.  MyChart is used to connect with patients for Virtual Visits (Telemedicine).  Patients are able to view lab/test results, encounter notes, upcoming appointments, etc.  Non-urgent messages can be sent to your provider as well.   To learn more about what you can do with MyChart, go to [email or web address].    Your next appointment:   6 months with Dr. Lovena Le  The format for your next appointment:   In Person  Provider:   Cristopher Peru, MD{or one of the following Advanced Practice Providers on your designated Care Team:   Tommye Standard, [place] Legrand Como "Jonni Sanger" Chalmers Cater, [place]  Remote monitoring is used to monitor your Pacemaker from home. This monitoring reduces the number of office visits required to check  your device to one time per year. It allows [place] to keep an eye on the functioning of your device to ensure it is working properly. You are scheduled for a device check from home on 03/17/22. You may send your transmission at any time that day. If you have a wireless device, the transmission will be sent automatically. After your physician reviews your transmission, you will receive a postcard with your next transmission date.  Important Information About [place]

## 2022-02-09 DIAGNOSIS — M5416 Radiculopathy, lumbar region: Secondary | ICD-10-CM | POA: Diagnosis not present

## 2022-02-11 ENCOUNTER — Telehealth: Payer: Self-pay | Admitting: Cardiovascular Disease

## 2022-02-11 NOTE — Telephone Encounter (Signed)
Left message of patient to call back.

## 2022-02-11 NOTE — Telephone Encounter (Signed)
Pt c/o medication issue:  1. Name of Medication:  stop all blood thinner  2. How are you currently taking this medication (dosage and times per day)?   3. Are you having a reaction (difficulty breathing--STAT)?   4. What is your medication issue?  Been having chest pains off and on-since he stopped the blood thinner -Eliquis   Pt c/o of Chest Pain: STAT if CP now or developed within 24 hours  1. Are you having CP right now? No- last had pains around 8:00 this morning- not having any at this time   2. Are you experiencing any other symptoms (ex. SOB, nausea, vomiting, sweating)? no  3. How long have you been experiencing CP? Started about 02-08-22  4. Is your CP continuous or coming and going? Comes and goes  5. Have you taken Nitroglycerin? no ?

## 2022-02-11 NOTE — Telephone Encounter (Signed)
Patient's call sent straight to triage. Patient complaining of chest pain when he eats and bends over. Patient stated he is not having any chest pain right now. Patient denies any SOB or any other symptoms. Patient stated the pain is right under his left breast. Patient stated he is off his anticoagulation medications. Dr. Lovena Le started eliquis for new onset A. FIB seen on his device on 12/17/21 Patient stated he started having blood in his stool so his eiliquis was stopped on 01/07/22 and directed to see PCP after CBC was done. Patient stated PCP has him seeing GI at the end of this month. Patient stated his chest pain did not start until he stopped his aspirin 12/31/21 per PCP, due to anemia and iron deficiency. Discovered patient has xarelto 10 mg on his medication list that his PCP ordered on 01/11/22 at 20 mg daily then changed to a lower dose (10  mg) on 01/25/22. Tried to call patient back to see if he is still on xarelto. Patient made it sound like he was not taking any blood thinners. Will forward to Dr. Lovena Le and Dr. Angelena Form for further advisement.

## 2022-02-12 NOTE — Telephone Encounter (Signed)
Left message for patient to call back  

## 2022-02-15 NOTE — Telephone Encounter (Signed)
Agree with stopping anti-coagulation. F/u with GI. He will need a colonoscopy.

## 2022-02-16 NOTE — Telephone Encounter (Signed)
Left message to call back  

## 2022-02-16 NOTE — Telephone Encounter (Signed)
Follow Up:     Patient is retuning Joel [name]'s call from this morning.

## 2022-02-16 NOTE — Telephone Encounter (Signed)
Left message for patient to call back and that he should ask the operator to see if I am available to speak with him since we keep missing him.

## 2022-02-17 ENCOUNTER — Ambulatory Visit (INDEPENDENT_AMBULATORY_CARE_PROVIDER_SITE_OTHER): Payer: Medicare HMO | Admitting: Medical

## 2022-02-17 VITALS — BP 120/58 | HR 71 | Ht 69.0 in | Wt 244.0 lb

## 2022-02-17 DIAGNOSIS — M545 Low back pain, unspecified: Secondary | ICD-10-CM

## 2022-02-17 DIAGNOSIS — I723 Aneurysm of iliac artery: Secondary | ICD-10-CM | POA: Diagnosis not present

## 2022-02-17 DIAGNOSIS — D509 Iron deficiency anemia, unspecified: Secondary | ICD-10-CM | POA: Diagnosis not present

## 2022-02-17 DIAGNOSIS — J439 Emphysema, unspecified: Secondary | ICD-10-CM | POA: Diagnosis not present

## 2022-02-17 DIAGNOSIS — D696 Thrombocytopenia, unspecified: Secondary | ICD-10-CM

## 2022-02-17 DIAGNOSIS — I70211 Atherosclerosis of native arteries of extremities with intermittent claudication, right leg: Secondary | ICD-10-CM

## 2022-02-17 DIAGNOSIS — G8929 Other chronic pain: Secondary | ICD-10-CM

## 2022-02-17 DIAGNOSIS — E7800 Pure hypercholesterolemia, unspecified: Secondary | ICD-10-CM

## 2022-02-17 DIAGNOSIS — I2510 Atherosclerotic heart disease of native coronary artery without angina pectoris: Secondary | ICD-10-CM

## 2022-02-17 DIAGNOSIS — Z0000 Encounter for general adult medical examination without abnormal findings: Secondary | ICD-10-CM

## 2022-02-17 DIAGNOSIS — I739 Peripheral vascular disease, unspecified: Secondary | ICD-10-CM

## 2022-02-17 DIAGNOSIS — Z950 Presence of cardiac pacemaker: Secondary | ICD-10-CM

## 2022-02-17 DIAGNOSIS — E1165 Type 2 diabetes mellitus with hyperglycemia: Secondary | ICD-10-CM | POA: Diagnosis not present

## 2022-02-17 DIAGNOSIS — I700 Atherosclerosis of aorta: Secondary | ICD-10-CM

## 2022-02-17 DIAGNOSIS — D563 Thalassemia minor: Secondary | ICD-10-CM

## 2022-02-17 DIAGNOSIS — R159 Full incontinence of feces: Secondary | ICD-10-CM

## 2022-02-17 DIAGNOSIS — R911 Solitary pulmonary nodule: Secondary | ICD-10-CM

## 2022-02-17 DIAGNOSIS — K5900 Constipation, unspecified: Secondary | ICD-10-CM

## 2022-02-17 DIAGNOSIS — Z803 Family history of malignant neoplasm of breast: Secondary | ICD-10-CM

## 2022-02-17 DIAGNOSIS — I10 Essential (primary) hypertension: Secondary | ICD-10-CM

## 2022-02-17 NOTE — Telephone Encounter (Signed)
Patient returning call.

## 2022-02-17 NOTE — Telephone Encounter (Signed)
Pt called back, 02/17/2022 at 835 am.    Pt states he is not taking any blood thinners, and is currently not taking Xarelto, as listed on his MAR.  Pt understands the importance of NOT taking any anti-coagulants at this time.   Pt also stated that his stools are no longer bloody.   He has an appointment with [facility] / Dr. Audelia Acton at 230 pm today.   Pt expressed difficulties with follow up GI  appointments, but was going to discuss this with Dr. Audelia Acton.

## 2022-02-17 NOTE — Telephone Encounter (Signed)
Pt called and message left for patient to calll me back today.    Per Dr. Lovena Le, pt is to stop all anti-coagulation and recommends follow up with GI.    Call was placed to ensure anti-coagulation was stopped.  Follow up required.

## 2022-02-17 NOTE — Progress Notes (Signed)
Subjective: Chief Complaint  Patient presents with   fasting cpe    Fasting cpe, had awv done on 05/29/21. Will call GI to schedule   Here for well visit.  Patient Care Team: Jordani Nunn, Camelia Eng, PA-C as PCP - General (Family Medicine) Burnell Blanks, MD as Consulting Physician (Cardiology) Clinic, Veryl Speak, Mariam Dollar, [place] as Pharmacist (Pharmacist) Evans Lance, MD as Consulting Physician (Cardiology) Warden Fillers, MD as Consulting Physician (Ophthalmology) Zonia Kief, MD (Rehabilitation) Orie Rout, MD as Referring Physician (Specialist) Angelia Mould, MD as Consulting Physician (Vascular Surgery) Edrick Kins, DPM as Consulting Physician (Podiatry) Freddy Finner, PA-C (Hematology and Oncology) Burnell Blanks, MD as Consulting Physician (Cardiology) Carlean Jews, MD as Referring Physician (Hematology and Oncology) Urology? Not sure Hematology Oncology, [facility], [place], [place]   Concerns: Not currently on blood thinner.  Cardiology referred to GI and he is pending consult.  No current bleeding or bruising.     He sees specialists at the [facility] hospital as well.     Past Medical History:  Diagnosis Date   Atypical chest pain    CAD (coronary artery disease)    a. Multiple prior evaluations then 05/2012 s/p BMS to OM2. b. Nuc 11/2012 wnl. c. Cath 2015: patent stent, moderate LAD/RCA dz, treated medically.   Cataract    CHEST PAIN UNSPECIFIED 09/13/2008   Qualifier: Diagnosis of  By: Rose Fillers, RN, Heather     CKD (chronic kidney disease), stage II    COPD (chronic obstructive pulmonary disease) (HCC)    Cough secondary to angiotensin converting enzyme inhibitor (ACE-I)    Diabetes mellitus ([place]) 09/07/2019   Diabetes mellitus without complication (HCC)    Diverticulosis    DVT (deep venous thrombosis) (HCC)    Family history of breast cancer in first degree relative    GERD (gastroesophageal reflux  disease)    Headache(784.0)    Hx: of   History of blood transfusion    "I've had 2; don't know what it was related to" (11/09/2012)   HLD (hyperlipidemia)    HTN (hypertension)    Iron deficiency anemia    Lymphoproliferative disorder, low grade B cell ([place]) 05/21/2020   Microcytic anemia    Peripheral vascular disease ([place])    a. L pop-tibial bypass 2011, followed by [facility].   Personal history of prostate cancer    Rotator cuff injury    "left arm; never repaired" (11/09/2012)   Current Outpatient Medications on File Prior to Visit  Medication Sig Dispense Refill   abiraterone acetate (ZYTIGA) 250 MG tablet Take 1,000 mg by mouth daily.     Accu-Chek FastClix Lancets MISC Test once daily 102 each 12   albuterol (VENTOLIN HFA) 108 (90 Base) MCG/ACT inhaler INHALE 2 PUFFS INTO THE LUNGS EVERY 6 HOURS AS NEEDED FOR WHEEZING OR SHORTNESS OF BREATH 54 g 0   amLODipine (NORVASC) 10 MG tablet TAKE 1 TABLET(10 MG) BY MOUTH DAILY 90 tablet 1   brimonidine (ALPHAGAN) 0.2 % ophthalmic solution Place 1 drop into both eyes 2 (two) times daily.     carboxymethylcellul-glycerin (REFRESH RELIEVA) 0.5-0.9 % ophthalmic solution Place 1 drop into both eyes daily as needed for dry eyes.     diclofenac Sodium (VOLTAREN) 1 % GEL Apply 2 g topically 2 (two) times daily as needed (back pain).     gabapentin (NEURONTIN) 600 MG tablet Take 600 mg by mouth 2 (two) times daily.     isosorbide mononitrate (IMDUR) 120 MG  24 hr tablet Take 1 tablet (120 mg total) by mouth daily. 90 tablet 1   latanoprost (XALATAN) 0.005 % ophthalmic solution Place 1 drop into both eyes at bedtime.     loratadine-pseudoephedrine (CLARITIN-D 24-HOUR) 10-240 MG 24 hr tablet Take 1 tablet by mouth daily as needed for allergies.     Multiple Vitamins-Minerals (CENTRUM SILVER PO) Take by mouth.     nitroGLYCERIN (NITROSTAT) 0.4 MG SL tablet Place 1 tablet (0.4 mg total) under the tongue every 5 (five) minutes as needed for chest pain (max 3  doses). 25 tablet 3   omeprazole (PRILOSEC) 20 MG capsule Take 20 mg by mouth daily.     polyethylene glycol powder (GLYCOLAX/MIRALAX) 17 GM/SCOOP powder Take 17 g by mouth daily. 289 g 0   pravastatin (PRAVACHOL) 40 MG tablet TAKE 1 TABLET(40 MG) BY MOUTH EVERY EVENING 90 tablet 3   predniSONE (DELTASONE) 5 MG tablet Take 5 mg by mouth daily.     TAMSULOSIN HCL PO Take 10 mg by mouth daily.     traMADol (ULTRAM) 50 MG tablet Take 1 tablet (50 mg total) by mouth every 12 (twelve) hours as needed. 30 tablet 0   No current facility-administered medications on file prior to visit.   Past Surgical History:  Procedure Laterality Date   CARDIOVASCULAR STRESS TEST  Aug. 2014   CATARACT EXTRACTION W/ INTRAOCULAR LENS IMPLANT Right ~ 2008   CIRCUMCISION     COLONOSCOPY     CORONARY ANGIOPLASTY WITH STENT PLACEMENT  2014   "1" (11/09/2012)   ILIAC ARTERY ANEURYSM REPAIR     LEFT HEART CATH  05-31-12   LEFT HEART CATH AND CORONARY ANGIOGRAPHY N/A 08/01/2019   Procedure: LEFT HEART CATH AND CORONARY ANGIOGRAPHY;  Surgeon: Burnell Blanks, MD;  Location: [place] [facility];  Service: Cardiovascular;  Laterality: N/A;   LEFT HEART CATHETERIZATION WITH CORONARY ANGIOGRAM N/A 06/01/2011   Procedure: LEFT HEART CATHETERIZATION WITH CORONARY ANGIOGRAM;  Surgeon: Hillary Bow, MD;  Location: [facility] CATH LAB;  Service: Cardiovascular;  Laterality: N/A;   LEFT HEART CATHETERIZATION WITH CORONARY ANGIOGRAM N/A 05/31/2012   Procedure: LEFT HEART CATHETERIZATION WITH CORONARY ANGIOGRAM;  Surgeon: Peter M Martinique, MD;  Location: [facility] CATH LAB;  Service: Cardiovascular;  Laterality: N/A;   LEFT HEART CATHETERIZATION WITH CORONARY ANGIOGRAM N/A 08/01/2013   Procedure: LEFT HEART CATHETERIZATION WITH CORONARY ANGIOGRAM;  Surgeon: Burnell Blanks, MD;  Location: [facility] CATH LAB;  Service: Cardiovascular;  Laterality: N/A;   PACEMAKER IMPLANT N/A 09/03/2021   Procedure: PACEMAKER IMPLANT;  Surgeon: Evans Lance,  MD;  Location: [place] [facility];  Service: Cardiovascular;  Laterality: N/A;   PERCUTANEOUS CORONARY STENT INTERVENTION (PCI-S)  05/31/2012   Procedure: PERCUTANEOUS CORONARY STENT INTERVENTION (PCI-S);  Surgeon: Peter M Martinique, MD;  Location: [facility] CATH LAB;  Service: Cardiovascular;;   PR VEIN BYPASS GRAFT,AORTO-FEM-POP Left 10/01/2009   left below knee popliteal artery to posterior tibial artery   SHOULDER ARTHROSCOPY WITH OPEN ROTATOR CUFF REPAIR AND DISTAL CLAVICLE ACROMINECTOMY Left 01/12/2013   Procedure: LEFT SHOULDER ARTHROSCOPY WITH DEBRIDEMENT OPEN DISTAL CLAVICLE RESECTION ,acromioplastyAND ROTATOR CUFF REPAIR;  Surgeon: Yvette Rack., MD;  Location: [facility];  Service: Orthopedics;  Laterality: Left;   ROS as in subjective    Objective: BP (!) 120/58   Pulse 71   Ht '5\' 9"'$  (1.753 m)   Wt 244 lb (110.7 kg)   BMI 36.03 kg/m   BP Readings from Last 3 Encounters:  02/17/22 Marland Kitchen)  120/58  02/04/22 124/62  01/22/22 124/78   Wt Readings from Last 3 Encounters:  02/17/22 244 lb (110.7 kg)  02/04/22 243 lb (110.2 kg)  01/22/22 242 lb 3.2 oz (109.9 kg)    General appearence: alert, no distress, WD/WN,  Neck: supple, no lymphadenopathy, no thyromegaly, no masses Heart: RRR, normal S1, S2, no murmurs Lungs: CTA bilaterally, no wheezes, rhonchi, or rales Abdomen: +bs, soft, non tender, non distended, no masses, no hepatomegaly, no splenomegaly Pulses: 2+ symmetric, upper extremities, normal cap refill Ext: no edema Psych: pleasant, good eye contact, answers questions  appropriately MSK: arthritis bony changes noted of bilat hand MCPs Neuro: CN2-12 instant, nonfocal exam   Diabetic Foot Exam - Simple   Simple Foot Form Diabetic Foot exam was performed with the following findings: Yes 02/17/2022  3:30 PM  Visual Inspection No deformities, no ulcerations, no other skin breakdown bilaterally: Yes Sensation Testing Intact to touch and monofilament testing bilaterally: Yes Pulse  Check Posterior Tibialis and Dorsalis pulse intact bilaterally: Yes Comments 1+ pedal pulses, flat feet bilat, dry skin, monofilament sensation dulled but able to feel at the 5.0 monofilament, no ulceration or obvious callous      Assessment: Encounter Diagnoses  Name Primary?   Encounter for health maintenance examination in adult Yes   Controlled type 2 diabetes mellitus with hyperglycemia, without long-term current use of insulin ([place])    Pulmonary emphysema, unspecified emphysema type (HCC)    PVD (peripheral vascular disease) with claudication ([place])    Essential hypertension    Aneurysm of iliac artery (HCC)    Aortic atherosclerosis (HCC)    Atherosclerosis of native artery of right lower extremity with intermittent claudication ([place])    Coronary artery disease involving native coronary artery without angina pectoris, unspecified whether native or transplanted heart    Iron deficiency anemia, unspecified iron deficiency anemia type    Pure hypercholesterolemia    Family history of breast cancer in first degree relative    Constipation, unspecified constipation type    Chronic low back pain, unspecified back pain laterality, unspecified whether sciatica present    Alpha thalassaemia minor    Thrombocytopenia (HCC)    Pacemaker    Pulmonary nodule    Incontinence of feces, unspecified fecal incontinence type     Plan: Vaccine counseling: Immunization History  Administered Date(s) Administered   Fluad Quad(high Dose 65+) 01/10/2019, 01/16/2021, 12/31/2021   Influenza, High Dose Seasonal PF 02/08/2017, 01/20/2018   Influenza-Unspecified 01/23/2020, 01/10/2021   Moderna Sars-Covid-2 Vaccination 05/16/2019, 06/13/2019, 02/12/2020, 07/21/2020   Pfizer Covid-19 Vaccine Bivalent Booster 50yr & up 02/24/2021   Pneumococcal Conjugate-13 08/21/2019   Pneumococcal Polysaccharide-23 01/14/2011   Pneumococcal-Unspecified 01/23/2020   Tdap 12/23/2015   Get your Shingrix vaccine  at the pharmacy  Plan to do Prevnar 20 pneumococcal vaccine in 2-3 years   Heart block, pacemaker, CAD, atherosclerosis of aorta, paroxysmal atrial fibrillation He was initially started on Eliquis and recent months that had some bleeding issues and bruising.  he may end up being considered for Watchman device.   Currently asymptomatic in terms of SVT or heart block,  Echo 5/22 with 65% EF, normal LV function, no regional wall motion abnormalities, impaired relaxation   COPD Former smoker Uses albuterol as needed Last PFT 06/2020 normal   History of prostate cancer On Zytiga per urology On Tamsulosin for urinary symptoms   Hyperlipidemia, atheroscerlosis  Continue pravastatin 40 mg daily Last lipids 06/2021 showed elevated TRIG and low HDL.  LDL was 67  Updated lab today   GERD Currently on omeprazole Consider changing to Protonix?   Chronic constipation Pending GI appt soon Stool incontinence   Peripheral vascular disease, pararenal aneurysm Abnormal ABI 9/22 showing right lower extremity disease Vascular ultrasound LE study 01/08/21 with widely patent below knee popliteal to posterior tibial artery bypass graft, mid distal superficial femoral artery stenosis 50-74% Per 03/2021 notes, he is past due for updated graft study and ABIs.  Advised he follow up with vascular at this time Pararenal aneurysm - per 03/2021 vascular notes, a graft was done at [facility] 07/2009, but given the complicated nature of the aneurysm location and overall health crisis, they felt he would be high risk for open repair.  As of 03/2021 other options were being considered.   OSA  Currently not on Bipap declines as supplier required credit card for payment and he declined   Diabetes type 2 HgA1C 6.6% 11/24/21 Currently diet controlled Glucose monitoring - only checks every other week   History of non-hodgkin lymphoma, alpha thalassemia minor, chronic anemia We will request most recent  records from [facility].   He has been seeing them recently, but a year ago was seeing specialist in [place]. He notes IV iron in past 3 months  No recent lymphadenopathy on chest CT 09/30/2021.  Scan did show ground glass and consolidative opacity in both upper lobes that are thought to be atelectasis, stable pulmonary nodules measuring up to 4 mm  I reviewed back over October 29, 2020 hematology oncology notes.  The problems at that time include low-grade B-cell non-Hodgkin's lymphoma, iron deficiency anemia.  Looking through the records as of September of that same year 2022 he was not willing to pursue other visits or treatment at that time  Nevertheless, as of July 2022 the plan was to continue Rituximab for lymphoma, IV iron due to microcytic anemia, and ENT consult given hypermetabolic parotid lesions on PET scan.  His last IV iron was in December 2022, he remained microcytic due to alpha thalassemia, hemoglobin electrophoresis November 2021 did not show any abnormalities of the hemoglobin.    Pulmonary nodule CT angiogram chest 09/30/2021 shows stable scattered tiny pulmonary nodules, up to 4 mm.  Plan to repeat chest CT June 2024   Advanced directives:  Discussed     Dax was seen today for fasting cpe.  Diagnoses and all orders for this visit:  Encounter for health maintenance examination in adult -     Hemoglobin A1c -     Lipid panel -     Microalbumin/Creatinine Ratio, Urine -     Iron, TIBC and Ferritin Panel -     CBC with Differential/Platelet  Controlled type 2 diabetes mellitus with hyperglycemia, without long-term current use of insulin (HCC) -     Hemoglobin A1c -     Microalbumin/Creatinine Ratio, Urine  Pulmonary emphysema, unspecified emphysema type (HCC)  PVD (peripheral vascular disease) with claudication (HCC)  Essential hypertension  Aneurysm of iliac artery (HCC)  Aortic atherosclerosis (HCC)  Atherosclerosis of native artery of  right lower extremity with intermittent claudication (HCC)  Coronary artery disease involving native coronary artery without angina pectoris, unspecified whether native or transplanted heart  Iron deficiency anemia, unspecified iron deficiency anemia type -     Iron, TIBC and Ferritin Panel -     CBC with Differential/Platelet  Pure hypercholesterolemia -     Lipid panel  Family history of breast cancer in first degree relative  Constipation, unspecified  constipation type  Chronic low back pain, unspecified back pain laterality, unspecified whether sciatica present  Alpha thalassaemia minor -     CBC with Differential/Platelet  Thrombocytopenia (HCC) -     Iron, TIBC and Ferritin Panel -     CBC with Differential/Platelet  Pacemaker  Pulmonary nodule  Incontinence of feces, unspecified fecal incontinence type   F/u pending labs

## 2022-02-19 ENCOUNTER — Other Ambulatory Visit: Payer: Self-pay

## 2022-02-19 DIAGNOSIS — J449 Chronic obstructive pulmonary disease, unspecified: Secondary | ICD-10-CM

## 2022-02-19 DIAGNOSIS — I25110 Atherosclerotic heart disease of native coronary artery with unstable angina pectoris: Secondary | ICD-10-CM

## 2022-02-19 DIAGNOSIS — Z79899 Other long term (current) drug therapy: Secondary | ICD-10-CM

## 2022-02-19 LAB — IRON,TIBC AND FERRITIN PANEL
Ferritin: 344 ng/mL (ref 30–400)
Iron Saturation: 12 % — ABNORMAL LOW (ref 15–55)
Iron: 27 ug/dL — ABNORMAL LOW (ref 38–169)
Total Iron Binding Capacity: 233 ug/dL — ABNORMAL LOW (ref 250–450)
UIBC: 206 ug/dL (ref 111–343)

## 2022-02-19 LAB — CBC WITH DIFFERENTIAL/PLATELET
Basophils Absolute: 0 10*3/uL (ref 0.0–0.2)
Basos: 1 %
EOS (ABSOLUTE): 0.2 10*3/uL (ref 0.0–0.4)
Eos: 5 %
Hematocrit: 31.5 % — ABNORMAL LOW (ref 37.5–51.0)
Hemoglobin: 9.7 g/dL — ABNORMAL LOW (ref 13.0–17.7)
Immature Grans (Abs): 0.1 10*3/uL (ref 0.0–0.1)
Immature Granulocytes: 2 %
Lymphocytes Absolute: 0.7 10*3/uL (ref 0.7–3.1)
Lymphs: 14 %
MCH: 21.6 pg — ABNORMAL LOW (ref 26.6–33.0)
MCHC: 30.8 g/dL — ABNORMAL LOW (ref 31.5–35.7)
MCV: 70 fL — ABNORMAL LOW (ref 79–97)
Monocytes Absolute: 0.4 10*3/uL (ref 0.1–0.9)
Monocytes: 10 %
Neutrophils Absolute: 3.1 10*3/uL (ref 1.4–7.0)
Neutrophils: 68 %
Platelets: 231 10*3/uL (ref 150–450)
RBC: 4.49 x10E6/uL (ref 4.14–5.80)
RDW: 15.8 % — ABNORMAL HIGH (ref 11.6–15.4)
WBC: 4.6 10*3/uL (ref 3.4–10.8)

## 2022-02-19 LAB — LIPID PANEL
Chol/HDL Ratio: 3.5 ratio (ref 0.0–5.0)
Cholesterol, Total: 140 mg/dL (ref 100–199)
HDL: 40 mg/dL (ref >39)
LDL Chol Calc (NIH): 66 mg/dL (ref 0–99)
Triglycerides: 205 mg/dL — ABNORMAL HIGH (ref 0–149)
VLDL Cholesterol Cal: 34 mg/dL (ref 5–40)

## 2022-02-19 LAB — MICROALBUMIN / CREATININE URINE RATIO
Creatinine, Urine: 18.9 mg/dL
Microalb/Creat Ratio: 22 mg/g creat (ref 0–29)
Microalbumin, Urine: 4.1 ug/mL

## 2022-02-19 LAB — HEMOGLOBIN A1C
Est. average glucose Bld gHb Est-mCnc: 134 mg/dL
Hgb A1c MFr Bld: 6.3 % — ABNORMAL HIGH (ref 4.8–5.6)

## 2022-02-19 MED ORDER — AMLODIPINE BESYLATE 10 MG PO TABS: ORAL_TABLET | ORAL | 2 refills | Status: DC

## 2022-02-19 MED ORDER — ISOSORBIDE MONONITRATE ER 120 MG PO TB24: 120.0000 mg | ORAL_TABLET | Freq: Every day | ORAL | 2 refills | Status: DC

## 2022-02-19 MED ORDER — PRAVASTATIN SODIUM 40 MG PO TABS: ORAL_TABLET | ORAL | 2 refills | Status: DC

## 2022-02-19 MED ORDER — NITROGLYCERIN 0.4 MG SL SUBL: 0.4000 mg | SUBLINGUAL_TABLET | SUBLINGUAL | 2 refills | Status: AC | PRN

## 2022-02-22 ENCOUNTER — Other Ambulatory Visit: Payer: Self-pay | Admitting: Medical

## 2022-02-22 MED ORDER — FERROUS GLUCONATE 324 (38 FE) MG PO TABS: 324.0000 mg | ORAL_TABLET | Freq: Every day | ORAL | 0 refills | Status: DC

## 2022-02-24 DIAGNOSIS — L02213 Cutaneous abscess of chest wall: Secondary | ICD-10-CM | POA: Diagnosis not present

## 2022-03-03 ENCOUNTER — Other Ambulatory Visit: Payer: Self-pay

## 2022-03-03 ENCOUNTER — Emergency Department (HOSPITAL_COMMUNITY): Payer: Medicare HMO

## 2022-03-03 ENCOUNTER — Encounter (HOSPITAL_COMMUNITY): Payer: Self-pay

## 2022-03-03 ENCOUNTER — Emergency Department (HOSPITAL_COMMUNITY)
Admission: EM | Admit: 2022-03-03 | Discharge: 2022-03-04 | Disposition: A | Discharge status: Home / Self Care | Payer: Medicare HMO | Attending: Emergency Medicine | Admitting: Emergency Medicine

## 2022-03-03 DIAGNOSIS — Z79899 Other long term (current) drug therapy: Secondary | ICD-10-CM | POA: Diagnosis not present

## 2022-03-03 DIAGNOSIS — I10 Essential (primary) hypertension: Secondary | ICD-10-CM | POA: Diagnosis not present

## 2022-03-03 DIAGNOSIS — R0789 Other chest pain: Secondary | ICD-10-CM | POA: Diagnosis not present

## 2022-03-03 DIAGNOSIS — E119 Type 2 diabetes mellitus without complications: Secondary | ICD-10-CM | POA: Diagnosis not present

## 2022-03-03 DIAGNOSIS — R072 Precordial pain: Secondary | ICD-10-CM | POA: Diagnosis not present

## 2022-03-03 DIAGNOSIS — R109 Unspecified abdominal pain: Secondary | ICD-10-CM | POA: Insufficient documentation

## 2022-03-03 DIAGNOSIS — I2510 Atherosclerotic heart disease of native coronary artery without angina pectoris: Secondary | ICD-10-CM | POA: Insufficient documentation

## 2022-03-03 DIAGNOSIS — R079 Chest pain, unspecified: Secondary | ICD-10-CM | POA: Diagnosis not present

## 2022-03-03 LAB — CBC WITH DIFFERENTIAL/PLATELET
Abs Immature Granulocytes: 0.1 10*3/uL — ABNORMAL HIGH (ref 0.00–0.07)
Basophils Absolute: 0 10*3/uL (ref 0.0–0.1)
Basophils Relative: 1 %
Eosinophils Absolute: 0.4 10*3/uL (ref 0.0–0.5)
Eosinophils Relative: 8 %
HCT: 33.4 % — ABNORMAL LOW (ref 39.0–52.0)
Hemoglobin: 9.8 g/dL — ABNORMAL LOW (ref 13.0–17.0)
Immature Granulocytes: 2 %
Lymphocytes Relative: 18 %
Lymphs Abs: 0.9 10*3/uL (ref 0.7–4.0)
MCH: 21.3 pg — ABNORMAL LOW (ref 26.0–34.0)
MCHC: 29.3 g/dL — ABNORMAL LOW (ref 30.0–36.0)
MCV: 72.6 fL — ABNORMAL LOW (ref 80.0–100.0)
Monocytes Absolute: 0.5 10*3/uL (ref 0.1–1.0)
Monocytes Relative: 10 %
Neutro Abs: 3 10*3/uL (ref 1.7–7.7)
Neutrophils Relative %: 61 %
Platelets: 221 10*3/uL (ref 150–400)
RBC: 4.6 MIL/uL (ref 4.22–5.81)
RDW: 16.3 % — ABNORMAL HIGH (ref 11.5–15.5)
WBC: 4.9 10*3/uL (ref 4.0–10.5)
nRBC: 0 % (ref 0.0–0.2)

## 2022-03-03 LAB — COMPREHENSIVE METABOLIC PANEL
ALT: 12 U/L (ref 0–44)
AST: 18 U/L (ref 15–41)
Albumin: 3.8 g/dL (ref 3.5–5.0)
Alkaline Phosphatase: 76 U/L (ref 38–126)
Anion gap: 12 (ref 5–15)
BUN: 9 mg/dL (ref 8–23)
CO2: 25 mmol/L (ref 22–32)
Calcium: 9.7 mg/dL (ref 8.9–10.3)
Chloride: 106 mmol/L (ref 98–111)
Creatinine, Ser: 1.03 mg/dL (ref 0.61–1.24)
GFR, Estimated: >60 mL/min (ref >60)
Glucose, Bld: 124 mg/dL — ABNORMAL HIGH (ref 70–99)
Potassium: 3.5 mmol/L (ref 3.5–5.1)
Sodium: 143 mmol/L (ref 135–145)
Total Bilirubin: 0.4 mg/dL (ref 0.3–1.2)
Total Protein: 6.8 g/dL (ref 6.5–8.1)

## 2022-03-03 LAB — LIPASE, BLOOD: Lipase: 41 U/L (ref 11–51)

## 2022-03-03 LAB — TROPONIN I (HIGH SENSITIVITY)
Troponin I (High Sensitivity): 10 ng/L (ref <18)
Troponin I (High Sensitivity): 11 ng/L (ref <18)

## 2022-03-03 NOTE — ED Provider Notes (Signed)
[facility] EMERGENCY DEPARTMENT Provider Note   CSN: [id number] Arrival date & time: 03/03/22  1916     History  Chief Complaint  Patient presents with   Chest Pain    Joel [name] is a 81 y.o. male.  The history is provided by the patient and medical records. No language interpreter was used.  Chest Pain Pain location:  Epigastric and substernal area Pain quality: dull and pressure   Pain quality: not aching, not burning, not crushing, not radiating, not sharp, not stabbing, not tearing and not throbbing   Pain radiates to:  Epigastrium Pain severity:  Moderate Onset quality:  Gradual Progression:  Resolved Chronicity:  New Worsened by:  Nothing Ineffective treatments:  None tried Associated symptoms: abdominal pain   Associated symptoms: no altered mental status, no anxiety, no back pain, no cough, no diaphoresis, no fatigue, no fever, no headache, no lower extremity edema, no nausea, no palpitations, no shortness of breath, no vomiting and no weakness   Risk factors: coronary artery disease, diabetes mellitus, hypertension and prior DVT/PE        Home Medications Prior to Admission medications   Medication Sig Start Date End Date Taking? Authorizing Provider  ferrous gluconate (FERGON) 324 MG tablet Take 1 tablet (324 mg total) by mouth daily with breakfast. 02/22/22   Tysinger, Camelia Eng, PA-C  abiraterone acetate (ZYTIGA) 250 MG tablet Take 1,000 mg by mouth daily.    [provider]  Accu-Chek FastClix Lancets MISC Test once daily 11/23/18   Henson, Vickie L, NP-C  albuterol (VENTOLIN HFA) 108 (90 Base) MCG/ACT inhaler INHALE 2 PUFFS INTO THE LUNGS EVERY 6 HOURS AS NEEDED FOR WHEEZING OR SHORTNESS OF BREATH 03/15/19   Henson, Vickie L, NP-C  amLODipine (NORVASC) 10 MG tablet TAKE 1 TABLET(10 MG) BY MOUTH DAILY 02/19/22   Burnell Blanks, MD  brimonidine (ALPHAGAN) 0.2 % ophthalmic solution Place 1 drop into both eyes 2 (two)  times daily. 05/09/20   [provider]  carboxymethylcellul-glycerin (REFRESH RELIEVA) 0.5-0.9 % ophthalmic solution Place 1 drop into both eyes daily as needed for dry eyes.    [provider]  diclofenac Sodium (VOLTAREN) 1 % GEL Apply 2 g topically 2 (two) times daily as needed (back pain). 06/16/20   [provider]  gabapentin (NEURONTIN) 600 MG tablet Take 600 mg by mouth 2 (two) times daily.    [provider]  isosorbide mononitrate (IMDUR) 120 MG 24 hr tablet Take 1 tablet (120 mg total) by mouth daily. 02/19/22   Burnell Blanks, MD  latanoprost (XALATAN) 0.005 % ophthalmic solution Place 1 drop into both eyes at bedtime. 02/28/20   [provider]  loratadine-pseudoephedrine (CLARITIN-D 24-HOUR) 10-240 MG 24 hr tablet Take 1 tablet by mouth daily as needed for allergies.    [provider]  Multiple Vitamins-Minerals (CENTRUM SILVER PO) Take by mouth.    [provider]  nitroGLYCERIN (NITROSTAT) 0.4 MG SL tablet Place 1 tablet (0.4 mg total) under the tongue every 5 (five) minutes as needed for chest pain (max 3 doses). 02/19/22   Burnell Blanks, MD  omeprazole (PRILOSEC) 20 MG capsule Take 20 mg by mouth daily.    [provider]  polyethylene glycol powder (GLYCOLAX/MIRALAX) 17 GM/SCOOP powder Take 17 g by mouth daily. 01/25/22   Tysinger, Camelia Eng, PA-C  pravastatin (PRAVACHOL) 40 MG tablet TAKE 1 TABLET(40 MG) BY MOUTH EVERY EVENING 02/19/22   Burnell Blanks, MD  predniSONE (  DELTASONE) 5 MG tablet Take 5 mg by mouth daily. 07/17/20   [provider]  TAMSULOSIN HCL PO Take 10 mg by mouth daily.    [provider]  traMADol (ULTRAM) 50 MG tablet Take 1 tablet (50 mg total) by mouth every 12 (twelve) hours as needed. 02/02/22   Tysinger, Camelia Eng, PA-C      Allergies    Topiramate, Atenolol, Lisinopril, and Tramadol-acetaminophen    Review of Systems   Review of Systems   Constitutional:  Negative for diaphoresis, fatigue and fever.  HENT:  Negative for congestion.   Respiratory:  Negative for cough, chest tightness, shortness of breath and wheezing.   Cardiovascular:  Positive for chest pain. Negative for palpitations.  Gastrointestinal:  Positive for abdominal pain. Negative for constipation, diarrhea, nausea and vomiting.  Genitourinary:  Negative for dysuria and flank pain.  Musculoskeletal:  Negative for back pain, neck pain and neck stiffness.  Skin:  Negative for rash and wound.  Neurological:  Negative for weakness, light-headedness and headaches.  Psychiatric/Behavioral:  Negative for agitation and confusion.   All other systems reviewed and are negative.   Physical Exam Updated Vital Signs BP 131/79 (BP Location: Right Arm)   Pulse 61   Temp 97.8 F (36.6 C) (Oral)   Resp 18   Ht '5\' 9"'$  (1.753 m)   Wt 110.7 kg   SpO2 95%   BMI 36.03 kg/m  Physical Exam Vitals and nursing note reviewed.  Constitutional:      General: He is not in acute distress.    Appearance: He is well-developed. He is not ill-appearing, toxic-appearing or diaphoretic.  HENT:     Head: Normocephalic and atraumatic.  Eyes:     Conjunctiva/sclera: Conjunctivae normal.     Pupils: Pupils are equal, round, and reactive to light.  Cardiovascular:     Rate and Rhythm: Normal rate and regular rhythm.     Heart sounds: No murmur heard. Pulmonary:     Effort: Pulmonary effort is normal. No respiratory distress.     Breath sounds: Normal breath sounds. No decreased breath sounds, wheezing, rhonchi or rales.  Chest:     Chest wall: No tenderness.  Abdominal:     Palpations: Abdomen is soft.     Tenderness: There is no abdominal tenderness.  Musculoskeletal:        General: No swelling.     Cervical back: Neck supple.     Right lower leg: No tenderness. No edema.     Left lower leg: No tenderness. No edema.  Skin:    General: Skin is warm and dry.     Capillary  Refill: Capillary refill takes less than 2 seconds.  Neurological:     General: No focal deficit present.     Mental Status: He is alert.  Psychiatric:        Mood and Affect: Mood normal. Mood is not anxious.     ED Results / Procedures / Treatments   Labs (all labs ordered are listed, but only abnormal results are displayed) Labs Reviewed  CBC WITH DIFFERENTIAL/PLATELET - Abnormal; Notable for the following components:      Result Value   Hemoglobin 9.8 (*)    HCT 33.4 (*)    MCV 72.6 (*)    MCH 21.3 (*)    MCHC 29.3 (*)    RDW 16.3 (*)    Abs Immature Granulocytes 0.10 (*)    All other components within normal limits  COMPREHENSIVE METABOLIC PANEL -  Abnormal; Notable for the following components:   Glucose, Bld 124 (*)    All other components within normal limits  LIPASE, BLOOD  URINALYSIS, ROUTINE W REFLEX MICROSCOPIC  TROPONIN I (HIGH SENSITIVITY)  TROPONIN I (HIGH SENSITIVITY)    EKG EKG Interpretation  Date/Time:  Wednesday March 03 2022 19:44:27 EST Ventricular Rate:  73 PR Interval:  194 QRS Duration: 134 QT Interval:  450 QTC Calculation: 495 R Axis:   -11 Text Interpretation: Atrial-sensed ventricular-paced rhythm Abnormal ECG When compared with ECG of 13-Nov-2021 15:53, PREVIOUS ECG IS PRESENT when comapred to prior, similar paced rhythm, No STEMI Confirmed by Antony Blackbird [id number]) on 03/03/2022 9:47:15 PM  Radiology DG Chest 2 View  Result Date: 03/03/2022 CLINICAL DATA:  Chest pain EXAM: CHEST - 2 VIEW COMPARISON:  Chest x-ray dated October 07, 2021 FINDINGS: Cardiac and mediastinal contours are within normal limits left chest wall dual lead pacer with unchanged lead position. Mild bibasilar opacities. No pleural effusion or pneumothorax. IMPRESSION: Mild bibasilar opacities, possibly due to atelectasis, infection or aspiration. Electronically Signed   By: Yetta Glassman M.D.   On: 03/03/2022 20:11    Procedures Procedures    Medications Ordered  in ED Medications - No data to display   ED Course/ Medical Decision Making/ A&P                           Medical Decision Making   NASIAH LEHENBAUER is a 81 y.o. male with a past medical history significant for CAD with previous PCI, previous arrhythmias with a pacemaker, COPD, GERD, previous TIA, hypertension, hyperlipidemia, previous DVT in the leg, diverticulosis, diabetes, and previous GI bleeding not on anticoagulation therapy now who presents with chest pain.  According to patient, this evening he had onset of discomfort in his chest epigastric area.  He felt like it was a gas bubble and it did not improve when he burps.  He tried eating a half a hotdog that did not seem to help and he also took some baking soda to see if that would help him burp.  He reports that it continued and he took several nitroglycerin that did not seem to help.  He reports does not feel like previous MI pain or previous arrhythmia pains.  He reports no associated shortness of breath, vomiting, persistent nausea, constipation, diarrhea, or other complaints.  No new leg pain or leg swelling.  He reports he had resolution of the symptoms and he has not had the symptoms since has been here for several hours.  He denies any rashes, trauma, or other complaints and is feeling well now.  On exam, lungs are clear.  Chest is nontender.  Abdomen is nontender.  Normal bowel sounds.  No back or flank tenderness.  Good pulses in extremities.  Legs nontender and not critically edematous.  He reports everything else feels normal now.  EKG does not show STEMI.  Patient had some labs in triage that showed no elevated  liver function, normal kidney function and electrolytes overall reassuring.  No leukocytosis and very mild anemia similar to prior.  Lipase normal.  Initial troponin negative.  Chest x-ray Shows opacities in the bases bilaterally which appear to be similar to what he had on CT imaging previously.  He is denying any  fevers, chills, or cough, doubt pneumonia.  Clinically, patient has been here for over 4 hours without any further pain.  He suspects was a gas bubble.  If his troponin is negative, he would like to go home and follow-up with outpatient cardiology.  We offered to call cardiology tonight given his history of CAD and PCI but he would rather go home if he continues to feel well.  As his pain is not pleuritic, he is not short of breath, not tachycardic or tachypneic or hypoxic, have low suspicion for thromboembolic etiology and patient agrees.  Awaiting second troponin results.  If it is reassuring, dissipate discharge home for close follow-up.  11:57 PM Second troponin was negative.  Patient would like to go home.  He will call his cardiology and PCP teams and have close follow-up.  He did not want to wait for cardiac pacemaker interrogation or cardiology consult at this time.  This is felt to be reasonable given his stability for over 4 hours.  Clinically I suspect he did have some gas pains which is what he suspected when he first arrived.  He understands return precautions and follow-up instructions and patient was discharged in good condition.        Final Clinical Impression(s) / ED Diagnoses Final diagnoses:  Atypical chest pain     Clinical Impression: 1. Atypical chest pain     Disposition: Discharge  Condition: Good  I have discussed the results, Dx and Tx plan with the pt(& family if present). He/she/they expressed understanding and agree(s) with the plan. Discharge instructions discussed at great length. Strict return precautions discussed and pt &/or family have verbalized understanding of the instructions. No further questions at time of discharge.    New Prescriptions   No medications on file    Follow Up: [facility] [place] [address] [phone]     your cardiologist     [facility] [address] [id number] mc [place]       Dafney Farler, Gwenyth Allegra, MD 03/03/22 [phone]

## 2022-03-03 NOTE — ED Triage Notes (Addendum)
Pt reports left sided chest pressure that began in his epigastric area, onset today at 5:30pm when eating dinner (hot dogs). Denies SOB/n/v. He took 3 nitro SL tabs today and Tramadol with minimal relief. He has a pacemaker.

## 2022-03-03 NOTE — ED Provider Triage Note (Signed)
Emergency Medicine Provider Triage Evaluation Note  Joel [name] , a 81 y.o. male  was evaluated in triage.  Pt complains of chest pain starting when he was about to eat dinner.  Started in the epigastric area and radiated up to his chest, felt like "he needed to belch".  Denies any nausea, vomiting or shortness of breath.  He took tramadol and 3 nitro's, pain got somewhat better but did not fully reside. .  Review of Systems  Per HPI Physical Exam  BP (!) 162/84 (BP Location: Right Arm)   Pulse 68   Temp 98 F (36.7 C) (Oral)   Resp 18   Ht '5\' 9"'$  (1.753 m)   Wt 110.7 kg   SpO2 96%   BMI 36.03 kg/m  Gen:   Awake, no distress   Resp:  Normal effort  MSK:   Moves extremities without difficulty  Other:  Extremity pulses symmetric bilaterally.  Regular rhythm, mild epigastric tenderness but no rigidity or guarding  Medical Decision Making  Medically screening exam initiated at 7:44 PM.  Appropriate orders placed.  Joel [name] was informed that the remainder of the evaluation will be completed by another provider, this initial triage assessment does not replace that evaluation, and the importance of remaining in the ED until their evaluation is complete.     Sherrill Raring, PA-C 03/03/22 1945

## 2022-03-03 NOTE — ED Triage Notes (Addendum)
ERROR IN CHARTING

## 2022-03-03 NOTE — Discharge Instructions (Signed)
Your history, exam, work-up today were overall reassuring.  Your heart enzyme was negative both times we checked it and your chest x-ray appeared similar to prior with those opacities that were seen on previous CT imaging.  As you not have any fevers, chills, or cough or shortness of breath we have low suspicion for pneumonia or infection at this time.  I suspect this was gas pains which you initially suspected.  As your symptoms have remained resolved for over 4 hours and your reassessment was reassuring we do feel you are safe for discharge home.  We offered to contact cardiology tonight however given your lack of further symptoms, reassuring labs, we do feel you are safe for discharge home.  Please call them to get seen as well as get seen by your PCP.  If any symptoms change worsen or recur, please return to the nearest emergency department.

## 2022-03-08 ENCOUNTER — Telehealth: Payer: Self-pay | Admitting: Cardiovascular Disease

## 2022-03-08 ENCOUNTER — Telehealth: Payer: Self-pay | Admitting: Medical

## 2022-03-08 ENCOUNTER — Telehealth: Payer: Self-pay

## 2022-03-08 NOTE — Telephone Encounter (Signed)
Transition Care Management Follow-up Telephone Call Date of discharge and from where: Joel [name] 03/04/22 How have you been since you were released from the hospital? fair Any questions or concerns? Yes He wanted to know if you thought it was a good idea when his Cardiologist took him off of all his blood thinners.   Items Reviewed: Did the pt receive and understand the discharge instructions provided? Yes  Medications obtained and verified? Yes  Other? No  Any new allergies since your discharge? No  Dietary orders reviewed? Yes Do you have support at home? Yes   Home Care and Equipment/Supplies: Were home health services ordered? no   Follow up appointments reviewed:  PCP Hospital f/u appt confirmed? No  pt. Did not want to schedule a f/u here.  [facility] f/u appt confirmed? No  pt. Has called his cardiologist a few times to schedule a f/u with you.  Are transportation arrangements needed? No  If their condition worsens, is the pt aware to call PCP or go to the Emergency Dept.? Yes Was the patient provided with contact information for the PCP's office or ED? Yes Was to pt encouraged to call back with questions or concerns? Yes

## 2022-03-08 NOTE — Telephone Encounter (Signed)
Left message for patient to call back  

## 2022-03-08 NOTE — Telephone Encounter (Signed)
Patient states Dr. Lovena Le gave him a pacemaker and he took him off the blood thinner and then he went to the ED.

## 2022-03-09 NOTE — Telephone Encounter (Signed)
Pt returning nurses call from yesterday. Please advise

## 2022-03-10 NOTE — Telephone Encounter (Signed)
Left message for return call.

## 2022-03-11 ENCOUNTER — Ambulatory Visit: Payer: Medicare HMO | Admitting: Nurse Practitioner

## 2022-03-11 ENCOUNTER — Encounter: Payer: Self-pay | Admitting: Nurse Practitioner

## 2022-03-11 VITALS — BP 146/62 | HR 66 | Ht 69.0 in | Wt 239.0 lb

## 2022-03-11 DIAGNOSIS — K648 Other hemorrhoids: Secondary | ICD-10-CM

## 2022-03-11 DIAGNOSIS — K5900 Constipation, unspecified: Secondary | ICD-10-CM

## 2022-03-11 MED ORDER — HYDROCORTISONE (PERIANAL) 2.5 % EX CREA: 1.0000 | TOPICAL_CREAM | Freq: Every day | CUTANEOUS | 0 refills | Status: AC

## 2022-03-11 NOTE — Progress Notes (Addendum)
Chief Complaint:  PCP asked Joel [name] to see for evaluation of constipation and anemia   Assessment    Patient profile:  Joel [name] is a 81 y.o. male known to [place] with a past medical history of low-grade B-cell lymphoma , prostate cancer , CAD with previous PCI, heart block s/p pacemaker May 2023, COPD, GERD, chronic microcytic anemia , previous TIA, hypertension, hyperlipidemia, previous DVT in the leg, diverticulosis, diabetes, glaucoma.  See PMH /PSH for additional history   # Chronic microcytic anemia. Recent hgb stable at 9.8 without evidence for iron deficiency ( even off oral iron for several months. ). Labs earlier this month show a serum iron of 27, low TIBC, low iron sat%, ferritin of 344.   He restarted oral iron a couple of weeks ago.   # Recent painless rectal bleeding on oral anticoagulant, suspect it was hemorrhoidal.  Bleeding resolved several weeks ago off Eliquis. He has swollen internal hemorrhoids on anoscopy today.   # History of colon polyps March 2019. Two small tubular adenomas removed.   # Chest pain, resolved Recent ED visit. Cardiac etiology was ruled out.    # Afib. Eliquis recently stopped due to rectal bleeding and bleeding from skin on arm.    Plan   Hold oral iron. No evidence for iron deficiency and iron is exacerbating constipation.  Continue miralax as directed Trial of Anusol Cream nightly per rectum x 10 days Polyp surveillance colonoscopy probably isn't necessary given age Given findings of hemorrhoids on exam today, resolution of bleeding off [place] and advanced age we can hopefully forgo diagnostic colonoscopy but will discuss with Dr. Carlean Purl. Certainly if he has recurrent bleeding, or worsening anemia off oral iron then will need to reconsider.     GI Attending  Agree w/ above We will have him see me in clinic in 1-2 mos to ensure he is better  Gatha Mayer, MD, [facility]  HPI   Joel [name] has been followed by Joel [name] for chronic  microcytic anemia and constipation. He was last seen 04/23/20, please refer to that note for details. Of note duodenal biopsies in 2011 were negative for celiac disease. Duodenum unremarkable on EGD in 2018 ( no biopsies done). Duodenal biopsies by [place] in 2019 showed villous blunting. As far as I can tell his iron studies have not be c/w iron deficiency anemia but he has has been off and on iron over the years.   Joel. [name] had a pacemaker placed in May 2023. In early September Cardiology started him on Eliquis. At some point after that he began having painless rectal bleeding. Sounds like the dose was decreased but he continued to bleed.  [place] stopped, he was started on daily ASA. This was eventually stopped as well due to rectal bleeding. Off ASA and [place] he hasn't had any rectal bleeding in several weeks.    Joel [name] was seen in the ED on 03/01/2022 for evaluation of chest pain.  Hgb was stable at 9.8 off oral iron ( baseline hgb mid 9 to mid 10), MCV 72. EKG did not show a STEMI.  Troponin negative. Discharged home from ED.    Joel [name] struggles with constipation. He restarted oral iron a couple of weeks ago. He had been off oral iron for months due to constipation. Complains of abdomen feeling "tight" and it doesn't always improve with defecation. Miralax causes diarrhea if he takes it as directed. He finds that a spoonful every other day works  best. Previously Metamucil hasn't helped.   Previous GI Evaluation  March 2019 colonoscopy at [facility]  - adequate bowel prep.   -A 7 to 8 mm polyp was removed from the ICV and a 2 mm ascending colon polyp was removed.    Path - tubular adenomas.    Imaging   Labs:     Latest Ref Rng & Units 03/03/2022    7:46 PM 02/17/2022    3:32 PM 01/22/2022    2:38 PM  CBC  WBC 4.0 - 10.5 K/uL 4.9  4.6  5.3   Hemoglobin 13.0 - 17.0 g/dL 9.8  9.7  9.9   Hematocrit 39.0 - 52.0 % 33.4  31.5  31.3   Platelets 150 - 400 K/uL 221  231  195        Latest  Ref Rng & Units 03/03/2022    7:46 PM 11/13/2021    3:40 PM 06/26/2021    7:06 PM  Hepatic Function  Total Protein 6.5 - 8.1 g/dL 6.8  6.4  6.5   Albumin 3.5 - 5.0 g/dL 3.8  3.6  3.5   AST 15 - 41 U/L _0 ALT 0 - 44 U/L _1 Alk Phosphatase 38 - 126 U/L 76  81  82   Total Bilirubin 0.3 - 1.2 mg/dL 0.4  0.4  0.2    03/04/22 EKG Interpretation   Date/Time:                  Wednesday March 03 2022 19:44:27 EST Ventricular Rate:         73 PR Interval:                 194 QRS Duration: 134 QT Interval:                 450 QTC Calculation:        495 R Axis:                         -11 Text Interpretation:      Atrial-sensed ventricular-paced rhythm Abnormal ECG When compared with ECG of 13-Nov-2021 15:53, PREVIOUS ECG IS PRESENT when comapred to prior, similar paced rhythm, No STEMI Confirmed by Antony Blackbird [phone]) on 03/03/2022 9:47:15 PM    Past Medical History:  Diagnosis Date   Atypical chest pain    CAD (coronary artery disease)    a. Multiple prior evaluations then 05/2012 s/p BMS to OM2. b. Nuc 11/2012 wnl. c. Cath 2015: patent stent, moderate LAD/RCA dz, treated medically.   Cataract    CHEST PAIN UNSPECIFIED 09/13/2008   Qualifier: Diagnosis of  By: Rose Fillers, RN, Heather     CKD (chronic kidney disease), stage II    COPD (chronic obstructive pulmonary disease) (HCC)    Cough secondary to angiotensin converting enzyme inhibitor (ACE-I)    Diabetes mellitus ([place]) 09/07/2019   Diabetes mellitus without complication (HCC)    Diverticulosis    DVT (deep venous thrombosis) (HCC)    Family history of breast cancer in first degree relative    GERD (gastroesophageal reflux disease)    Headache(784.0)    Hx: of   History of blood transfusion    "I've had 2; don't know what it was related to" (11/09/2012)   HLD (hyperlipidemia)    HTN (hypertension)    Iron deficiency anemia    Lymphoproliferative disorder, low grade B cell (  [place]) 05/21/2020   Microcytic anemia     Peripheral vascular disease ([place])    a. L pop-tibial bypass 2011, followed by [facility].   Personal history of prostate cancer    Rotator cuff injury    "left arm; never repaired" (11/09/2012)    Past Surgical History:  Procedure Laterality Date   CARDIOVASCULAR STRESS TEST  Aug. 2014   CATARACT EXTRACTION W/ INTRAOCULAR LENS IMPLANT Right ~ 2008   CIRCUMCISION     COLONOSCOPY     CORONARY ANGIOPLASTY WITH STENT PLACEMENT  2014   "1" (11/09/2012)   ILIAC ARTERY ANEURYSM REPAIR     LEFT HEART CATH  05-31-12   LEFT HEART CATH AND CORONARY ANGIOGRAPHY N/A 08/01/2019   Procedure: LEFT HEART CATH AND CORONARY ANGIOGRAPHY;  Surgeon: Burnell Blanks, MD;  Location: [place] [facility];  Service: Cardiovascular;  Laterality: N/A;   LEFT HEART CATHETERIZATION WITH CORONARY ANGIOGRAM N/A 06/01/2011   Procedure: LEFT HEART CATHETERIZATION WITH CORONARY ANGIOGRAM;  Surgeon: Hillary Bow, MD;  Location: [facility] CATH LAB;  Service: Cardiovascular;  Laterality: N/A;   LEFT HEART CATHETERIZATION WITH CORONARY ANGIOGRAM N/A 05/31/2012   Procedure: LEFT HEART CATHETERIZATION WITH CORONARY ANGIOGRAM;  Surgeon: Peter M Martinique, MD;  Location: [facility] CATH LAB;  Service: Cardiovascular;  Laterality: N/A;   LEFT HEART CATHETERIZATION WITH CORONARY ANGIOGRAM N/A 08/01/2013   Procedure: LEFT HEART CATHETERIZATION WITH CORONARY ANGIOGRAM;  Surgeon: Burnell Blanks, MD;  Location[facility] LAB;  Service: Cardiovascular;  Laterality: N/A;   PACEMAKER IMPLANT N/A 09/03/2021   Procedure: PACEMAKER IMPLANT;  Surgeon: Evans Lance, MD;  Location: [place] [facility] LAB;  Service: Cardiovascular;  Laterality: N/A;   PERCUTANEOUS CORONARY STENT INTERVENTION (PCI-S)  05/31/2012   Procedure: PERCUTANEOUS CORONARY STENT INTERVENTION (PCI-S);  Surgeon: Peter M Martinique, MD;  Location: [facility] CATH LAB;  Service: Cardiovascular;;   PR VEIN BYPASS GRAFT,AORTO-FEM-POP Left 10/01/2009   left below knee popliteal artery to posterior tibial  artery   SHOULDER ARTHROSCOPY WITH OPEN ROTATOR CUFF REPAIR AND DISTAL CLAVICLE ACROMINECTOMY Left 01/12/2013   Procedure: LEFT SHOULDER ARTHROSCOPY WITH DEBRIDEMENT OPEN DISTAL CLAVICLE RESECTION ,acromioplastyAND ROTATOR CUFF REPAIR;  Surgeon: Yvette Rack., MD;  Location: [place];  Service: Orthopedics;  Laterality: Left;    Current Medications, Allergies, Family History and Social History were reviewed in [employer] record.     Current Outpatient Medications  Medication Sig Dispense Refill   ferrous gluconate (FERGON) 324 MG tablet Take 1 tablet (324 mg total) by mouth daily with breakfast. 30 tablet 0   abiraterone acetate (ZYTIGA) 250 MG tablet Take 1,000 mg by mouth daily.     Accu-Chek FastClix Lancets MISC Test once daily 102 each 12   albuterol (VENTOLIN HFA) 108 (90 Base) MCG/ACT inhaler INHALE 2 PUFFS INTO THE LUNGS EVERY 6 HOURS AS NEEDED FOR WHEEZING OR SHORTNESS OF BREATH 54 g 0   amLODipine (NORVASC) 10 MG tablet TAKE 1 TABLET(10 MG) BY MOUTH DAILY 90 tablet 2   brimonidine (ALPHAGAN) 0.2 % ophthalmic solution Place 1 drop into both eyes 2 (two) times daily.     carboxymethylcellul-glycerin (REFRESH RELIEVA) 0.5-0.9 % ophthalmic solution Place 1 drop into both eyes daily as needed for dry eyes.     diclofenac Sodium (VOLTAREN) 1 % GEL Apply 2 g topically 2 (two) times daily as needed (back pain).     gabapentin (NEURONTIN) 600 MG tablet Take 600 mg by mouth 2 (two) times daily.     isosorbide  mononitrate (IMDUR) 120 MG 24 hr tablet Take 1 tablet (120 mg total) by mouth daily. 90 tablet 2   latanoprost (XALATAN) 0.005 % ophthalmic solution Place 1 drop into both eyes at bedtime.     loratadine-pseudoephedrine (CLARITIN-D 24-HOUR) 10-240 MG 24 hr tablet Take 1 tablet by mouth daily as needed for allergies.     Multiple Vitamins-Minerals (CENTRUM SILVER PO) Take by mouth.     nitroGLYCERIN (NITROSTAT) 0.4 MG SL tablet Place 1 tablet (0.4 mg total) under the  tongue every 5 (five) minutes as needed for chest pain (max 3 doses). 75 tablet 2   omeprazole (PRILOSEC) 20 MG capsule Take 20 mg by mouth daily.     polyethylene glycol powder (GLYCOLAX/MIRALAX) 17 GM/SCOOP powder Take 17 g by mouth daily. 289 g 0   pravastatin (PRAVACHOL) 40 MG tablet TAKE 1 TABLET(40 MG) BY MOUTH EVERY EVENING 90 tablet 2   predniSONE (DELTASONE) 5 MG tablet Take 5 mg by mouth daily.     TAMSULOSIN HCL PO Take 10 mg by mouth daily.     traMADol (ULTRAM) 50 MG tablet Take 1 tablet (50 mg total) by mouth every 12 (twelve) hours as needed. 30 tablet 0   No current facility-administered medications for this visit.    Review of Systems: No chest pain. No shortness of breath. No urinary complaints.    Physical Exam  Wt Readings from Last 3 Encounters:  03/03/22 244 lb (110.7 kg)  02/17/22 244 lb (110.7 kg)  02/04/22 243 lb (110.2 kg)    BP (!) 146/62   Pulse 66   Ht 5' 9" (1.753 m)   Wt 239 lb (108.4 kg)   BMI 35.29 kg/m  Constitutional:  Generally well appearing male in no acute distress. Psychiatric: Pleasant. Normal mood and affect. Behavior is normal. EENT: Pupils normal.  Conjunctivae are normal. No scleral icterus. Neck supple.  Cardiovascular: Normal rate, regular rhythm. No edema Pulmonary/chest: Effort normal and breath sounds normal. No wheezing, rales or rhonchi. Abdominal: Soft, nondistended, nontender. Bowel sounds active throughout. There are no masses palpable. No hepatomegaly. Rectal: No external lesions seen. No masses felt on DRE. Soft, light brown stool in vault. On anoscopy there were swollen internal hemorrhoids.  Neurological: Alert and oriented to person place and time. Skin: Skin is warm and dry. No rashes noted.  Tye Savoy, NP  03/11/2022, 9:00 AM  Cc:  Carlena Hurl, PA-C

## 2022-03-11 NOTE — Patient Instructions (Addendum)
We have sent the following medications to your pharmacy for you to pick up at your convenience: Anusol cream  Stop taking oral iron.  Continue Miralax as directed.  It was a pleasure to see you today!  Thank you for trusting me with your gastrointestinal care!

## 2022-03-12 ENCOUNTER — Ambulatory Visit (INDEPENDENT_AMBULATORY_CARE_PROVIDER_SITE_OTHER): Payer: Medicare HMO

## 2022-03-12 DIAGNOSIS — I442 Atrioventricular block, complete: Secondary | ICD-10-CM

## 2022-03-15 LAB — CUP PACEART REMOTE DEVICE CHECK
Date Time Interrogation Session: 20231201103428
Implantable Lead Connection Status: 753985
Implantable Lead Connection Status: 753985
Implantable Lead Implant Date: 20230525
Implantable Lead Implant Date: 20230525
Implantable Lead Location: 753859
Implantable Lead Location: 753860
Implantable Pulse Generator Implant Date: 20230525
Pulse Gen Model: 2272
Pulse Gen Serial Number: 8084442

## 2022-03-17 ENCOUNTER — Ambulatory Visit: Payer: Medicare HMO | Attending: Cardiovascular Disease | Admitting: Cardiovascular Disease

## 2022-03-17 ENCOUNTER — Encounter: Payer: Self-pay | Admitting: Cardiovascular Disease

## 2022-03-17 VITALS — BP 120/60 | HR 60 | Ht 69.0 in | Wt 240.0 lb

## 2022-03-17 DIAGNOSIS — I442 Atrioventricular block, complete: Secondary | ICD-10-CM | POA: Diagnosis not present

## 2022-03-17 DIAGNOSIS — I10 Essential (primary) hypertension: Secondary | ICD-10-CM

## 2022-03-17 DIAGNOSIS — E782 Mixed hyperlipidemia: Secondary | ICD-10-CM

## 2022-03-17 DIAGNOSIS — I25118 Atherosclerotic heart disease of native coronary artery with other forms of angina pectoris: Secondary | ICD-10-CM | POA: Diagnosis not present

## 2022-03-17 DIAGNOSIS — I480 Paroxysmal atrial fibrillation: Secondary | ICD-10-CM

## 2022-03-17 MED ORDER — ASPIRIN 81 MG PO TBEC: 81.0000 mg | DELAYED_RELEASE_TABLET | Freq: Every day | ORAL | 3 refills | Status: AC

## 2022-03-17 NOTE — Progress Notes (Signed)
Chief Complaint  Patient presents with   Follow-up    CAD, PAF    History of Present Illness: 81 yo male with history of HTN, HLD, CAD, PAD, DM, MALT of orbit, heart block s/p pacemaker placement, paroxysmal atrial fibrillation and NSVT who is here today for cardiac follow up. Cardiac cath February 2014 severe OM2 stenosis treated with a bare metal stent. Since then he has had chronic angina and has been seen in the office and ED many times. I saw him in the office in April 2015 and he c/o exertional chest pain c/w unstable angina. Cardiac cath 08/01/13 with patent stent Circumflex and mild to moderate disease in the LAD and RCA. Multiple ED visits with chest pain in 2017. Nuclear stress test November 2018 with  EF 55% with moderate inferior defect that partially improves consistent with ischemia and possible soft tissue attenuation.  Small region of anterior anterior septal ischemia. Diagnosed with prostate cancer in 2019. I saw him in the ED 07/15/17 and he had atypical chest pain with negative troponin and no EKG changes. He was seen in my office 08/01/17 and doing well. Most recently seen in the ED 01/28/18 with atypical chest pain, negative troponin. He was seen in the ED October 2019, April 2020, May 2020 and June 2020 with atypical chest pain. Cardiac cath April 2021 with moderate diffuse CAD but no focally obstructive lesions except in the PDA which is too small for stenting. He was seen in the ED 05/06/20 with chest pain, troponin negative. He has been diagnosed with MALT of his orbit followed at [facility]. He was admitted to [facility] May 2022 with chest pain and had negative troponin. Echo May 2022 with LVEF=65%, no significant valve disease. Imdur was increased. His PAD is followed in [facility] by Dr. Scot Dock. Nuclear stress test in January 2023 with no ischemia. He had complete heart block in April 2023 and had a pacemaker placed in May 2023. His device showed episodes of atrial fib. He was started  on anti-coagulation but developed GI bleeding. His bleeding resolved when the anti-coagulation was stopped. ASA was also stopped.   He is here today for follow up. The patient denies any chest pain, dyspnea, palpitations, lower extremity edema, orthopnea, PND, dizziness, near syncope or syncope.   Primary Care Physician:  Caryl Ada  Past Medical History:  Diagnosis Date   Atypical chest pain    CAD (coronary artery disease)    a. Multiple prior evaluations then 05/2012 s/p BMS to OM2. b. Nuc 11/2012 wnl. c. Cath 2015: patent stent, moderate LAD/RCA dz, treated medically.   Cataract    CHEST PAIN UNSPECIFIED 09/13/2008   Qualifier: Diagnosis of  By: Rose Fillers, RN, Heather     CKD (chronic kidney disease), stage II    COPD (chronic obstructive pulmonary disease) (HCC)    Cough secondary to angiotensin converting enzyme inhibitor (ACE-I)    Diabetes mellitus ([place]) 09/07/2019   Diabetes mellitus without complication (HCC)    Diverticulosis    DVT (deep venous thrombosis) (HCC)    Family history of breast cancer in first degree relative    GERD (gastroesophageal reflux disease)    Headache(784.0)    Hx: of   History of blood transfusion    "I've had 2; don't know what it was related to" (11/09/2012)   HLD (hyperlipidemia)    HTN (hypertension)    Iron deficiency anemia    Lymphoproliferative disorder, low grade B cell ([place]) 05/21/2020  Microcytic anemia    Peripheral vascular disease ([place])    a. L pop-tibial bypass 2011, followed by [facility].   Personal history of prostate cancer    Rotator cuff injury    "left arm; never repaired" (11/09/2012)    Past Surgical History:  Procedure Laterality Date   CARDIOVASCULAR STRESS TEST  Aug. 2014   CATARACT EXTRACTION W/ INTRAOCULAR LENS IMPLANT Right ~ 2008   CIRCUMCISION     COLONOSCOPY     CORONARY ANGIOPLASTY WITH STENT PLACEMENT  2014   "1" (11/09/2012)   ILIAC ARTERY ANEURYSM REPAIR     LEFT HEART CATH  05-31-12   LEFT HEART CATH  AND CORONARY ANGIOGRAPHY N/A 08/01/2019   Procedure: LEFT HEART CATH AND CORONARY ANGIOGRAPHY;  Surgeon: Burnell Blanks, MD;  Location: [place] [facility];  Service: Cardiovascular;  Laterality: N/A;   LEFT HEART CATHETERIZATION WITH CORONARY ANGIOGRAM N/A 06/01/2011   Procedure: LEFT HEART CATHETERIZATION WITH CORONARY ANGIOGRAM;  Surgeon: Hillary Bow, MD;  Location: [facility] CATH LAB;  Service: Cardiovascular;  Laterality: N/A;   LEFT HEART CATHETERIZATION WITH CORONARY ANGIOGRAM N/A 05/31/2012   Procedure: LEFT HEART CATHETERIZATION WITH CORONARY ANGIOGRAM;  Surgeon: Peter M Martinique, MD;  Location: [facility] CATH LAB;  Service: Cardiovascular;  Laterality: N/A;   LEFT HEART CATHETERIZATION WITH CORONARY ANGIOGRAM N/A 08/01/2013   Procedure: LEFT HEART CATHETERIZATION WITH CORONARY ANGIOGRAM;  Surgeon: Burnell Blanks, MD;  Location: [facility] CATH LAB;  Service: Cardiovascular;  Laterality: N/A;   PACEMAKER IMPLANT N/A 09/03/2021   Procedure: PACEMAKER IMPLANT;  Surgeon: Evans Lance, MD;  Location: [place] [facility];  Service: Cardiovascular;  Laterality: N/A;   PERCUTANEOUS CORONARY STENT INTERVENTION (PCI-S)  05/31/2012   Procedure: PERCUTANEOUS CORONARY STENT INTERVENTION (PCI-S);  Surgeon: Peter M Martinique, MD;  Location: [facility] CATH LAB;  Service: Cardiovascular;;   PR VEIN BYPASS GRAFT,AORTO-FEM-POP Left 10/01/2009   left below knee popliteal artery to posterior tibial artery   SHOULDER ARTHROSCOPY WITH OPEN ROTATOR CUFF REPAIR AND DISTAL CLAVICLE ACROMINECTOMY Left 01/12/2013   Procedure: LEFT SHOULDER ARTHROSCOPY WITH DEBRIDEMENT OPEN DISTAL CLAVICLE RESECTION ,acromioplastyAND ROTATOR CUFF REPAIR;  Surgeon: Yvette Rack., MD;  Location: [place];  Service: Orthopedics;  Laterality: Left;    Current Outpatient Medications  Medication Sig Dispense Refill   abiraterone acetate (ZYTIGA) 250 MG tablet Take 1,000 mg by mouth daily.     Accu-Chek FastClix Lancets MISC Test once daily 102 each 12    albuterol (VENTOLIN HFA) 108 (90 Base) MCG/ACT inhaler INHALE 2 PUFFS INTO THE LUNGS EVERY 6 HOURS AS NEEDED FOR WHEEZING OR SHORTNESS OF BREATH 54 g 0   amLODipine (NORVASC) 10 MG tablet TAKE 1 TABLET(10 MG) BY MOUTH DAILY 90 tablet 2   aspirin EC 81 MG tablet Take 1 tablet (81 mg total) by mouth daily. Swallow whole. 90 tablet 3   brimonidine (ALPHAGAN) 0.2 % ophthalmic solution Place 1 drop into both eyes 2 (two) times daily.     carboxymethylcellul-glycerin (REFRESH RELIEVA) 0.5-0.9 % ophthalmic solution Place 1 drop into both eyes daily as needed for dry eyes.     diclofenac Sodium (VOLTAREN) 1 % GEL Apply 2 g topically 2 (two) times daily as needed (back pain).     ferrous gluconate (FERGON) 324 MG tablet Take 1 tablet (324 mg total) by mouth daily with breakfast. 30 tablet 0   gabapentin (NEURONTIN) 600 MG tablet Take 600 mg by mouth 2 (two) times daily.     hydrocortisone (ANUSOL-HC) 2.5 %  rectal cream Place 1 Application rectally at bedtime for 10 days. 15 g 0   isosorbide mononitrate (IMDUR) 120 MG 24 hr tablet Take 1 tablet (120 mg total) by mouth daily. 90 tablet 2   latanoprost (XALATAN) 0.005 % ophthalmic solution Place 1 drop into both eyes at bedtime.     loratadine-pseudoephedrine (CLARITIN-D 24-HOUR) 10-240 MG 24 hr tablet Take 1 tablet by mouth daily as needed for allergies.     Multiple Vitamins-Minerals (CENTRUM SILVER PO) Take by mouth.     nitroGLYCERIN (NITROSTAT) 0.4 MG SL tablet Place 1 tablet (0.4 mg total) under the tongue every 5 (five) minutes as needed for chest pain (max 3 doses). 75 tablet 2   omeprazole (PRILOSEC) 20 MG capsule Take 20 mg by mouth daily.     polyethylene glycol powder (GLYCOLAX/MIRALAX) 17 GM/SCOOP powder Take 17 g by mouth daily. 289 g 0   pravastatin (PRAVACHOL) 40 MG tablet TAKE 1 TABLET(40 MG) BY MOUTH EVERY EVENING 90 tablet 2   predniSONE (DELTASONE) 5 MG tablet Take 5 mg by mouth daily.     TAMSULOSIN HCL PO Take 10 mg by mouth daily.      traMADol (ULTRAM) 50 MG tablet Take 1 tablet (50 mg total) by mouth every 12 (twelve) hours as needed. 30 tablet 0   No current facility-administered medications for this visit.    Allergies  Allergen Reactions   Topiramate Shortness Of Breath and Other (See Comments)     leg cramps   Atenolol Other (See Comments)    bradycardia   Lisinopril Cough   Tramadol-Acetaminophen Cough    Tolerates plain Tramadol    Social History   Socioeconomic History   Marital status: Widowed    Spouse name: Not on file   Number of children: Not on file   Years of education: Not on file   Highest education level: 12th grade  Occupational History   Occupation: Retired  Tobacco Use   Smoking status: Former    Packs/day: 0.50    Years: 52.00    Total pack years: 26.00    Types: Cigarettes    Quit date: 05/26/2011    Years since quitting: 10.8    Passive exposure: Past   Smokeless tobacco: Never  Vaping Use   Vaping Use: Never used  Substance and Sexual Activity   Alcohol use: No   Drug use: No   Sexual activity: Not on file  Other Topics Concern   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  Lives in [place] himself.      Patient writes with his right hand, though uses his left hand for every thing else. He lives alone in a one level home. He drinks 3 cups of coffee a day and 3 7-8 oz sodas a day. He does not exercise.   Social Determinants of Health   Financial Resource Strain: Low Risk  (06/11/2021)   Overall Financial Resource Strain (CARDIA)    Difficulty of Paying Living Expenses: Not hard at all  Food Insecurity: No Food Insecurity (05/29/2021)   Hunger Vital Sign    Worried About Running Out of Food in the Last Year: Never true    Ran Out of Food in the Last Year: Never true  Transportation Needs: No Transportation Needs (06/11/2021)   PRAPARE - Hydrologist (Medical): No    Lack of Transportation  (Non-Medical): No  Physical Activity: Inactive (05/29/2021)  Exercise Vital Sign    Days of Exercise per Week: 0 days    Minutes of Exercise per Session: 0 min  Stress: No Stress Concern Present (05/29/2021)   [place]    Feeling of Stress : Not at all  Social Connections: Not on file  Intimate Partner Violence: Not on file    Family History  Problem Relation Age of Onset   Breast cancer Mother        dx 61s   Hyperlipidemia Mother    Hypertension Mother    COPD Father        deceased   Hyperlipidemia Father    Hypertension Father    Hypertension Sister    Cancer Brother 26       Unknown cancer, possibly stomach   Hypertension Brother    Cancer Brother 40       Unknown, possibly lung   Hypertension Daughter    Hypertension Son    Colon cancer Neg Hx        pt is unclear about family hx    Review of Systems:  As stated in the HPI and otherwise negative.   BP 120/60   Pulse 60   Ht '5\' 9"'$  (1.753 m)   Wt 240 lb (108.9 kg)   SpO2 97%   BMI 35.44 kg/m   Physical Examination:  General: Well developed, well nourished, NAD  HEENT: OP clear, mucus membranes moist  SKIN: warm, dry. No rashes. Neuro: No focal deficits  Musculoskeletal: Muscle strength 5/5 all ext  Psychiatric: Mood and affect normal  Neck: No JVD, no carotid bruits, no thyromegaly, no lymphadenopathy.  Lungs:Clear bilaterally, no wheezes, rhonci, crackles Cardiovascular: Regular rate and rhythm. No murmurs, gallops or rubs. Abdomen:Soft. Bowel sounds present. Non-tender.  Extremities: No lower extremity edema. Pulses are 2 + in the bilateral DP/PT.  Echo May 2022: 1. Left ventricular ejection fraction, by estimation, is 65%. The left  ventricle has normal function. The left ventricle has no regional wall  motion abnormalities. Left ventricular diastolic parameters are consistent  with Grade I diastolic dysfunction  (impaired  relaxation).   2. Right ventricular systolic function is normal. The right ventricular  size is normal.   3. The mitral valve is grossly normal. No evidence of mitral valve  regurgitation. No evidence of mitral stenosis.   4. The aortic valve was not well visualized. Aortic valve regurgitation  is trivial. No aortic stenosis is present.   5. Aortic dilatation noted. There is mild dilatation of the aortic root,  measuring 41 mm. There is mild dilatation of the ascending aorta,  measuring 40 mm. This is within normal limits for age and BSA.   Cardiac cath 08/01/2019: Prox RCA lesion is 40% stenosed. Mid RCA lesion is 40% stenosed. Mid RCA to Dist RCA lesion is 50% stenosed. RPDA lesion is 70% stenosed. 3rd RPL lesion is 70% stenosed. Prox Cx to Dist Cx lesion is 40% stenosed. Prox LAD to Mid LAD lesion is 40% stenosed. Mid LAD lesion is 40% stenosed. Dist LAD lesion is 70% stenosed. The left ventricular systolic function is normal. LV end diastolic pressure is normal. The left ventricular ejection fraction is 55-65% by visual estimate. There is no mitral valve regurgitation.   1. Moderate non-obstructive disease in the LAD and Circumflex 2. Moderate non-obstructive disease in the RCA. Severe disease in the small caliber PDA, too small for PCI 3. Normal LV systolic function  EKG:  EKG  is not ordered today. The ekg ordered today demonstrates   Recent Labs: 06/26/2021: B Natriuretic Peptide 64.3; Magnesium 1.9 03/03/2022: ALT 12; BUN 9; Creatinine, Ser 1.03; Hemoglobin 9.8; Platelets 221; Potassium 3.5; Sodium 143    Wt Readings from Last 3 Encounters:  03/17/22 240 lb (108.9 kg)  03/11/22 239 lb (108.4 kg)  03/03/22 244 lb (110.7 kg)    Assessment and Plan:   1. CAD with stable angina: Most recent cath April 2021 with stable CAD, severe stenosis in small PDA that is too small for stenting. Otherwise mild to moderate disease in all three vessels. Nuclear stress test in January  2023 with no ischemia. No change in chronic chest pain. Continue statin, Norvasc and Imdur. His ASA has been stopped secondary to GI bleeding. He was seen by GI and felt to have hemorrhoids. Will restart ASA 81 mg daily.    2. HTN: BP is well controlled. No changes today   3. Hyperlipidemia: Lipids followed in primary care. LDL 49 in May 2022. Will continue statin.   4. Complete heart block: Pacemaker in place. Followed in EP clinic by Dr. Lovena Le  5. Paroxysmal atrial fibrillation: Sinus today. He has not tolerated anti-coagulation secondary to GI bleeding. GI felt that his bleeding was due to hemorrhoids. I do not think he will tolerate long term anti-coagulation. He may be a candidate for the Watchman device. Will route to Dr. Lovena Le and if felt to be appropriate, will refer to Dr. Quentin Ore or Dr. Burt Knack.      Labs/ tests ordered today include:  No orders of the defined types were placed in this encounter.  Disposition:   F/U with me in one year.   Signed, Lauree Chandler, MD 03/17/2022 4:19 PM    [facility] [address], [place], [place]  [address] Phone: [phone]; Fax: [phone]

## 2022-03-17 NOTE — Patient Instructions (Addendum)
Medication Instructions:  Restart aspirin 81 mg - one tablet daily *If you need a refill on your cardiac medications before your next appointment, please call your pharmacy*   Lab Work: none If you have labs (blood work) drawn today and your tests are completely normal, you will receive your results only by: [place] (if you have MyChart) OR A paper copy in the mail If you have any lab test that is abnormal or we need to change your treatment, we will call you to review the results.   Testing/Procedures: none   Follow-Up: At [facility], you and your health needs are our priority.  As part of our continuing mission to provide you with exceptional heart care, we have created designated Provider Care Teams.  These Care Teams include your primary Cardiologist (physician) and Advanced Practice Providers (APPs -  Physician Assistants and Nurse Practitioners) who all work together to provide you with the care you need, when you need it.  We recommend signing up for the patient portal called "MyChart".  Sign up information is provided on this After Visit Summary.  MyChart is used to connect with patients for Virtual Visits (Telemedicine).  Patients are able to view lab/test results, encounter notes, upcoming appointments, etc.  Non-urgent messages can be sent to your provider as well.   To learn more about what you can do with MyChart, go to [email or web address].    Your next appointment:   12 month(s)  The format for your next appointment:   In Person  Provider:   Lauree Chandler, MD   Important Information About Sugar

## 2022-03-17 NOTE — Telephone Encounter (Signed)
Seen today in clinic with Dr. Angelena Form.

## 2022-03-18 ENCOUNTER — Telehealth: Payer: Self-pay

## 2022-03-18 ENCOUNTER — Telehealth: Payer: Self-pay | Admitting: *Deleted

## 2022-03-18 NOTE — Telephone Encounter (Signed)
Scheduled the patient 03/22/2022 for Watchman consult with Dr. Quentin Ore. He was grateful for call and agreed with plan.

## 2022-03-18 NOTE — Telephone Encounter (Signed)
Pt scheduled to see Dr. Carlean Purl 04/22/22 at 8:50am. Appt letter mailed to pt.

## 2022-03-18 NOTE — Telephone Encounter (Signed)
Patient returned my call.  He said that Dr. Lovena Le has spoken with him about Watchman in the past.  He would like for his appointment to be scheduled and to call him with when it is.  He is busy doing something else right now and could not hold while I looked into today.

## 2022-03-18 NOTE — Telephone Encounter (Signed)
Joel [name], Can we let him know that we are going to place a referral to see Dr. Quentin Ore or Dr. Burt Knack to discuss Watchman? Gerald Stabs        ----- Message ----- From: Evans Lance, MD Sent: 03/17/2022   7:26 PM EST To: Burnell Blanks, MD  I agree. Send him on to [employer]. Thanks. GT ----- Message ----- From: Burnell Blanks, MD Sent: 03/17/2022   4:22 PM EST To: Evans Lance, MD; Theodoro Parma, RN  Carleene Overlie, He has hemorrhoids by recent GI evaluation and it does not sound like they are going to do anything. I don't think he will tolerate eliquis. What do you think about Watchman referral? Chris  ____________________________________________________________________________   Left message for patient to call back.

## 2022-03-18 NOTE — Telephone Encounter (Signed)
-----   Message from Willia Craze, NP sent at 03/12/2022  5:02 PM EST ----- Mickel Baas, please call this patient and give him an appointment to see Dr. Carlean Purl sometime in January.  Just let him know that we want to follow-up with him and make sure the bleeding has resolved and his anemia is stable. Thanks

## 2022-03-21 NOTE — Progress Notes (Unsigned)
Electrophysiology Office Note:    Date:  03/21/2022   ID:  Joel FILIP, DOB [birth year], MRN [id number]  PCP:  Carlena Hurl, PA-C  [facility] Cardiologist:  Lauree Chandler, MD  [facility] Electrophysiologist:  Vickie Epley, MD   Referring MD: Carlena Hurl, PA-C   Chief Complaint: Atrial fibrillation  History of Present Illness:    Joel [name] is a 81 y.o. male who presents for an evaluation of atrial fibrillation at the request of Dr. Angelena Form. Their medical history includes hypertension, hyperlipidemia, coronary artery disease, diabetes, complete heart block post permanent pacemaker, paroxysmal atrial fibrillation and GI bleeding.  The patient last saw Dr. Angelena Form March 17, 2022.  His diagnosis of atrial fibrillation was made by his pacemaker.  He was started on anticoagulation but developed GI bleeding.  The GI bleeding was severe enough to require stopping the anticoagulant and aspirin.  At the appointment with Dr. Angelena Form, his aspirin was restarted.  Given the GI bleeding, he was referred to discuss watchman implant.     Past Medical History:  Diagnosis Date   Atypical chest pain    CAD (coronary artery disease)    a. Multiple prior evaluations then 05/2012 s/p BMS to OM2. b. Nuc 11/2012 wnl. c. Cath 2015: patent stent, moderate LAD/RCA dz, treated medically.   Cataract    CHEST PAIN UNSPECIFIED 09/13/2008   Qualifier: Diagnosis of  By: Rose Fillers, RN, Heather     CKD (chronic kidney disease), stage II    COPD (chronic obstructive pulmonary disease) (HCC)    Cough secondary to angiotensin converting enzyme inhibitor (ACE-I)    Diabetes mellitus ([place]) 09/07/2019   Diabetes mellitus without complication (HCC)    Diverticulosis    DVT (deep venous thrombosis) (HCC)    Family history of breast cancer in first degree relative    GERD (gastroesophageal reflux disease)    Headache(784.0)    Hx: of   History of blood transfusion    "I've had 2;  don't know what it was related to" (11/09/2012)   HLD (hyperlipidemia)    HTN (hypertension)    Iron deficiency anemia    Lymphoproliferative disorder, low grade B cell ([place]) 05/21/2020   Microcytic anemia    Peripheral vascular disease ([place])    a. L pop-tibial bypass 2011, followed by [facility].   Personal history of prostate cancer    Rotator cuff injury    "left arm; never repaired" (11/09/2012)    Past Surgical History:  Procedure Laterality Date   CARDIOVASCULAR STRESS TEST  Aug. 2014   CATARACT EXTRACTION W/ INTRAOCULAR LENS IMPLANT Right ~ 2008   CIRCUMCISION     COLONOSCOPY     CORONARY ANGIOPLASTY WITH STENT PLACEMENT  2014   "1" (11/09/2012)   ILIAC ARTERY ANEURYSM REPAIR     LEFT HEART CATH  05-31-12   LEFT HEART CATH AND CORONARY ANGIOGRAPHY N/A 08/01/2019   Procedure: LEFT HEART CATH AND CORONARY ANGIOGRAPHY;  Surgeon: Burnell Blanks, MD;  Location: [place] [facility];  Service: Cardiovascular;  Laterality: N/A;   LEFT HEART CATHETERIZATION WITH CORONARY ANGIOGRAM N/A 06/01/2011   Procedure: LEFT HEART CATHETERIZATION WITH CORONARY ANGIOGRAM;  Surgeon: Hillary Bow, MD;  Location: [facility] CATH LAB;  Service: Cardiovascular;  Laterality: N/A;   LEFT HEART CATHETERIZATION WITH CORONARY ANGIOGRAM N/A 05/31/2012   Procedure: LEFT HEART CATHETERIZATION WITH CORONARY ANGIOGRAM;  Surgeon: Peter M Martinique, MD;  Location: [facility] CATH LAB;  Service: Cardiovascular;  Laterality: N/A;   LEFT  HEART CATHETERIZATION WITH CORONARY ANGIOGRAM N/A 08/01/2013   Procedure: LEFT HEART CATHETERIZATION WITH CORONARY ANGIOGRAM;  Surgeon: Burnell Blanks, MD;  Location: [facility] CATH LAB;  Service: Cardiovascular;  Laterality: N/A;   PACEMAKER IMPLANT N/A 09/03/2021   Procedure: PACEMAKER IMPLANT;  Surgeon: Evans Lance, MD;  Location: [place] [facility];  Service: Cardiovascular;  Laterality: N/A;   PERCUTANEOUS CORONARY STENT INTERVENTION (PCI-S)  05/31/2012   Procedure: PERCUTANEOUS CORONARY STENT  INTERVENTION (PCI-S);  Surgeon: Peter M Martinique, MD;  Location: [facility] CATH LAB;  Service: Cardiovascular;;   PR VEIN BYPASS GRAFT,AORTO-FEM-POP Left 10/01/2009   left below knee popliteal artery to posterior tibial artery   SHOULDER ARTHROSCOPY WITH OPEN ROTATOR CUFF REPAIR AND DISTAL CLAVICLE ACROMINECTOMY Left 01/12/2013   Procedure: LEFT SHOULDER ARTHROSCOPY WITH DEBRIDEMENT OPEN DISTAL CLAVICLE RESECTION ,acromioplastyAND ROTATOR CUFF REPAIR;  Surgeon: Yvette Rack., MD;  Location: [place];  Service: Orthopedics;  Laterality: Left;    Current Medications: No outpatient medications have been marked as taking for the 03/22/22 encounter (Office Visit) with Vickie Epley, MD.     Allergies:   Topiramate, Atenolol, Lisinopril, and Tramadol-acetaminophen   Social History   Socioeconomic History   Marital status: Widowed    Spouse name: Not on file   Number of children: Not on file   Years of education: Not on file   Highest education level: 12th grade  Occupational History   Occupation: Retired  Tobacco Use   Smoking status: Former    Packs/day: 0.50    Years: 52.00    Total pack years: 26.00    Types: Cigarettes    Quit date: 05/26/2011    Years since quitting: 10.8    Passive exposure: Past   Smokeless tobacco: Never  Vaping Use   Vaping Use: Never used  Substance and Sexual Activity   Alcohol use: No   Drug use: No   Sexual activity: Not on file  Other Topics Concern   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  Lives in [place] by himself.      Patient writes with his right hand, though uses his left hand for every thing else. He lives alone in a one level home. He drinks 3 cups of coffee a day and 3 7-8 oz sodas a day. He does not exercise.   Social Determinants of Health   Financial Resource Strain: Low Risk  (06/11/2021)   Overall Financial Resource Strain (CARDIA)    Difficulty of Paying Living Expenses: Not hard at all   Food Insecurity: No Food Insecurity (05/29/2021)   Hunger Vital Sign    Worried About Running Out of Food in the Last Year: Never true    Ran Out of Food in the Last Year: Never true  Transportation Needs: No Transportation Needs (06/11/2021)   PRAPARE - Hydrologist (Medical): No    Lack of Transportation (Non-Medical): No  Physical Activity: Inactive (05/29/2021)   Exercise Vital Sign    Days of Exercise per Week: 0 days    Minutes of Exercise per Session: 0 min  Stress: No Stress Concern Present (05/29/2021)   [place]    Feeling of Stress : Not at all  Social Connections: Not on file     Family History: The patient's family history includes Breast cancer in his mother; COPD in his father; Cancer (age of onset: 80)  in his brother; Cancer (age of onset: 49) in his brother; Hyperlipidemia in his father and mother; Hypertension in his brother, daughter, father, mother, sister, and son. There is no history of Colon cancer.  ROS:   Please see the history of present illness.    All other systems reviewed and are negative.  EKGs/Labs/Other Studies Reviewed:    The following studies were reviewed today:  March 22, 2022 in clinic device interrogation personally reviewed ***  Aug 27, 2020 echo EF 65 RV normal No MR Trivial AI   Recent Labs: 06/26/2021: B Natriuretic Peptide 64.3; Magnesium 1.9 03/03/2022: ALT 12; BUN 9; Creatinine, Ser 1.03; Hemoglobin 9.8; Platelets 221; Potassium 3.5; Sodium 143  Recent Lipid Panel    Component Value Date/Time   CHOL 140 02/17/2022 1532   TRIG 205 (H) 02/17/2022 1532   HDL 40 02/17/2022 1532   CHOLHDL 3.5 02/17/2022 1532   CHOLHDL 5 02/06/2013 0741   VLDL 24.4 02/06/2013 0741   LDLCALC 66 02/17/2022 1532    Physical Exam:    VS:  There were no vitals taken for this visit.    Wt Readings from Last 3 Encounters:  03/17/22 240 lb (108.9  kg)  03/11/22 239 lb (108.4 kg)  03/03/22 244 lb (110.7 kg)     GEN: *** Well nourished, well developed in no acute distress HEENT: Normal NECK: No JVD; No carotid bruits LYMPHATICS: No lymphadenopathy CARDIAC: ***RRR, no murmurs, rubs, gallops RESPIRATORY:  Clear to auscultation without rales, wheezing or rhonchi  ABDOMEN: Soft, non-tender, non-distended MUSCULOSKELETAL:  No edema; No deformity  SKIN: Warm and dry NEUROLOGIC:  Alert and oriented x 3 PSYCHIATRIC:  Normal affect       ASSESSMENT:    1. PAF (paroxysmal atrial fibrillation) ([place])   2. Complete heart block ([place])   3. Pacemaker   4. Bloody stool    PLAN:    In order of problems listed above:  #Paroxysmal atrial fibrillation Intolerant of anticoagulation long-term given history of GI bleeding.  Discussed role of left atrial appendage occlusion and stroke risk mitigation and he wishes to proceed with evaluation.  -----------------------  I have seen [name] in the office today who is being considered for a Watchman left atrial appendage closure device. I believe they will benefit from this procedure given their history of atrial fibrillation, CHA2DS2-VASc score of 5 and unadjusted ischemic stroke rate of 7.2% per year. Unfortunately, the patient is not felt to be a long term anticoagulation candidate secondary to history of GI bleeding. The patient's chart has been reviewed and I feel that they would be a candidate for short term oral anticoagulation after Watchman implant.   It is my belief that after undergoing a LAA closure procedure, SPIKE DESILETS will not need long term anticoagulation which eliminates anticoagulation side effects and major bleeding risk.   Procedural risks for the Watchman implant have been reviewed with the patient including a 0.5% risk of stroke, <1% risk of perforation and <1% risk of device embolization. Other risks include bleeding, vascular damage, tamponade, worsening renal  function, and death. The patient understands these risk and wishes to proceed.     The published clinical data on the safety and effectiveness of WATCHMAN include but are not limited to the following: - Holmes DR, Mechele Claude, Sick P et al. for the PROTECT AF Investigators. Percutaneous closure of the left atrial appendage versus warfarin therapy for prevention of stroke in patients with atrial fibrillation: a randomised non-inferiority trial.  Lancet 2009; 374: 534-42. Mechele Claude, Doshi SK, Abelardo Diesel D et al. on behalf of the PROTECT AF Investigators. Percutaneous Left Atrial Appendage Closure for Stroke Prophylaxis in Patients With Atrial Fibrillation 2.3-Year Follow-up of the PROTECT AF (Watchman Left Atrial Appendage System for Embolic Protection in Patients With Atrial Fibrillation) Trial. Circulation 2013; 127:720-729. - Alli O, Doshi S,  Kar S, Reddy VY, Sievert H et al. Quality of Life Assessment in the Randomized PROTECT AF (Percutaneous Closure of the Left Atrial Appendage Versus Warfarin Therapy for Prevention of Stroke in Patients With Atrial Fibrillation) Trial of Patients at Risk for Stroke With Nonvalvular Atrial Fibrillation. J Am Coll Cardiol 2013; 67:3419-3. Vertell Limber DR, Tarri Abernethy, Price M, [place], Sievert H, Doshi S, Huber K, Reddy V. Prospective randomized evaluation of the Watchman left atrial appendage Device in patients with atrial fibrillation versus long-term warfarin therapy; the PREVAIL trial. Journal of the [employer] of Cardiology, Vol. 4, No. 1, 2014, 1-11. - Kar S, Doshi SK, Sadhu A, Horton R, Osorio J et al. Primary outcome evaluation of a next-generation left atrial appendage closure device: results from the PINNACLE FLX trial. Circulation 2021;143(18)1754-1762.    After today's visit with the patient which was dedicated solely for shared decision making visit regarding LAA closure device, the patient decided to proceed with the LAA appendage closure procedure  scheduled to be done in the near future at [facility].   No CT will be needed given a September 30 2021 CT of the chest.  I will send the study for reprocessing to see if the anatomy is amenable to left atrial appendage closure.***    HAS-BLED score 4 Hypertension Yes  Abnormal renal and liver function (Dialysis, transplant, Cr >2.26 mg/dL /Cirrhosis or Bilirubin >2x Normal or AST/ALT/AP >3x Normal) No  Stroke No  Bleeding Yes  Labile INR (Unstable/high INR) No  Elderly (>65) Yes  Drugs or alcohol (? 8 drinks/week, anti-plt or NSAID) Yes   CHA2DS2-VASc Score = 5  The patient's score is based upon: CHF History: 0 HTN History: 1 Diabetes History: 1 Stroke History: 0 Vascular Disease History: 1 Age Score: 2 Gender Score: 0     Medication Adjustments/Labs and Tests Ordered: Current medicines are reviewed at length with the patient today.  Concerns regarding medicines are outlined above.  No orders of the defined types were placed in this encounter.  No orders of the defined types were placed in this encounter.    Signed, Hilton Cork. Quentin Ore, MD, [facility], [facility] 03/21/2022 5:04 PM    Electrophysiology [facility]

## 2022-03-22 ENCOUNTER — Encounter: Payer: Self-pay | Admitting: Cardiology

## 2022-03-22 ENCOUNTER — Other Ambulatory Visit: Payer: Self-pay

## 2022-03-22 ENCOUNTER — Ambulatory Visit: Payer: Medicare HMO | Attending: Cardiology | Admitting: Cardiology

## 2022-03-22 VITALS — BP 118/58 | HR 61 | Ht 69.0 in | Wt 240.6 lb

## 2022-03-22 DIAGNOSIS — I480 Paroxysmal atrial fibrillation: Secondary | ICD-10-CM

## 2022-03-22 DIAGNOSIS — K921 Melena: Secondary | ICD-10-CM

## 2022-03-22 DIAGNOSIS — I442 Atrioventricular block, complete: Secondary | ICD-10-CM | POA: Diagnosis not present

## 2022-03-22 DIAGNOSIS — Z950 Presence of cardiac pacemaker: Secondary | ICD-10-CM

## 2022-03-22 MED ORDER — AMLODIPINE BESYLATE 10 MG PO TABS: ORAL_TABLET | ORAL | 3 refills | Status: DC

## 2022-03-22 NOTE — Progress Notes (Signed)
Electrophysiology Office Note:    Date:  03/22/2022   ID:  Joel [name], DOB 1941/01/04, MRN [id number]  PCP:  Carlena Hurl, PA-C  [facility] Cardiologist:  Lauree Chandler, MD  [facility] Electrophysiologist:  Vickie Epley, MD   Referring MD: Carlena Hurl, PA-C   Chief Complaint: Atrial fibrillation  History of Present Illness:    Joel [name] is a 81 y.o. male who presents for an evaluation of atrial fibrillation at the request of Dr. Angelena Form. Their medical history includes hypertension, hyperlipidemia, coronary artery disease, diabetes, complete heart block post permanent pacemaker, paroxysmal atrial fibrillation and GI bleeding.  The patient last saw Dr. Angelena Form March 17, 2022.  His diagnosis of atrial fibrillation was made by his pacemaker.  He was started on anticoagulation but developed GI bleeding.  The GI bleeding was severe enough to require stopping the anticoagulant and aspirin.  At the appointment with Dr. Angelena Form, his aspirin was restarted.  Given the GI bleeding, he was referred to discuss watchman implant.  Today, he confirms a history of bleeding issues on anticoagulation. Currently he is back on 81 mg ASA. We reviewed the procedural details and risks of the watchman implant at length.  He denies any palpitations, chest pain, shortness of breath, or peripheral edema. No lightheadedness, headaches, syncope, orthopnea, or PND.      Past Medical History:  Diagnosis Date   Atypical chest pain    CAD (coronary artery disease)    a. Multiple prior evaluations then 05/2012 s/p BMS to OM2. b. Nuc 11/2012 wnl. c. Cath 2015: patent stent, moderate LAD/RCA dz, treated medically.   Cataract    CHEST PAIN UNSPECIFIED 09/13/2008   Qualifier: Diagnosis of  By: Rose Fillers, RN, Heather     CKD (chronic kidney disease), stage II    COPD (chronic obstructive pulmonary disease) (HCC)    Cough secondary to angiotensin converting enzyme inhibitor  (ACE-I)    Diabetes mellitus ([place]) 09/07/2019   Diabetes mellitus without complication (HCC)    Diverticulosis    DVT (deep venous thrombosis) (HCC)    Family history of breast cancer in first degree relative    GERD (gastroesophageal reflux disease)    Headache(784.0)    Hx: of   History of blood transfusion    "I've had 2; don't know what it was related to" (11/09/2012)   HLD (hyperlipidemia)    HTN (hypertension)    Iron deficiency anemia    Lymphoproliferative disorder, low grade B cell ([place]) 05/21/2020   Microcytic anemia    Peripheral vascular disease ([place])    a. L pop-tibial bypass 2011, followed by [facility].   Personal history of prostate cancer    Rotator cuff injury    "left arm; never repaired" (11/09/2012)    Past Surgical History:  Procedure Laterality Date   CARDIOVASCULAR STRESS TEST  Aug. 2014   CATARACT EXTRACTION W/ INTRAOCULAR LENS IMPLANT Right ~ 2008   CIRCUMCISION     COLONOSCOPY     CORONARY ANGIOPLASTY WITH STENT PLACEMENT  2014   "1" (11/09/2012)   ILIAC ARTERY ANEURYSM REPAIR     LEFT HEART CATH  05-31-12   LEFT HEART CATH AND CORONARY ANGIOGRAPHY N/A 08/01/2019   Procedure: LEFT HEART CATH AND CORONARY ANGIOGRAPHY;  Surgeon: Burnell Blanks, MD;  Location: [facility];  Service: Cardiovascular;  Laterality: N/A;   LEFT HEART CATHETERIZATION WITH CORONARY ANGIOGRAM N/A 06/01/2011   Procedure: LEFT HEART CATHETERIZATION WITH CORONARY ANGIOGRAM;  Surgeon: Marcello Moores  Baird Kay, MD;  Location: [facility] CATH [facility];  Service: Cardiovascular;  Laterality: N/A;   LEFT HEART CATHETERIZATION WITH CORONARY ANGIOGRAM N/A 05/31/2012   Procedure: LEFT HEART CATHETERIZATION WITH CORONARY ANGIOGRAM;  Surgeon: Peter M Martinique, MD;  Location[facility] CATH LAB;  Service: Cardiovascular;  Laterality: N/A;   LEFT HEART CATHETERIZATION WITH CORONARY ANGIOGRAM N/A 08/01/2013   Procedure: LEFT HEART CATHETERIZATION WITH CORONARY ANGIOGRAM;  Surgeon: Burnell Blanks, MD;  Location: [facility]  CATH LAB;  Service: Cardiovascular;  Laterality: N/A;   PACEMAKER IMPLANT N/A 09/03/2021   Procedure: PACEMAKER IMPLANT;  Surgeon: Evans Lance, MD;  Location: [facility];  Service: Cardiovascular;  Laterality: N/A;   PERCUTANEOUS CORONARY STENT INTERVENTION (PCI-S)  05/31/2012   Procedure: PERCUTANEOUS CORONARY STENT INTERVENTION (PCI-S);  Surgeon: Peter M Martinique, MD;  Location: [facility] CATH LAB;  Service: Cardiovascular;;   PR VEIN BYPASS GRAFT,AORTO-FEM-POP Left 10/01/2009   left below knee popliteal artery to posterior tibial artery   SHOULDER ARTHROSCOPY WITH OPEN ROTATOR CUFF REPAIR AND DISTAL CLAVICLE ACROMINECTOMY Left 01/12/2013   Procedure: LEFT SHOULDER ARTHROSCOPY WITH DEBRIDEMENT OPEN DISTAL CLAVICLE RESECTION ,acromioplastyAND ROTATOR CUFF REPAIR;  Surgeon: Yvette Rack., MD;  Location: [facility];  Service: Orthopedics;  Laterality: Left;    Current Medications: Current Meds  Medication Sig   abiraterone acetate (ZYTIGA) 250 MG tablet Take 250 mg by mouth daily.   Accu-Chek FastClix Lancets MISC Test once daily   albuterol (VENTOLIN HFA) 108 (90 Base) MCG/ACT inhaler INHALE 2 PUFFS INTO THE LUNGS EVERY 6 HOURS AS NEEDED FOR WHEEZING OR SHORTNESS OF BREATH   amLODipine (NORVASC) 10 MG tablet TAKE 1 TABLET(10 MG) BY MOUTH DAILY   aspirin EC 81 MG tablet Take 1 tablet (81 mg total) by mouth daily. Swallow whole.   brimonidine (ALPHAGAN) 0.2 % ophthalmic solution Place 1 drop into both eyes 2 (two) times daily.   carboxymethylcellul-glycerin (REFRESH RELIEVA) 0.5-0.9 % ophthalmic solution Place 1 drop into both eyes daily as needed for dry eyes.   diclofenac Sodium (VOLTAREN) 1 % GEL Apply 2 g topically 2 (two) times daily as needed (back pain).   gabapentin (NEURONTIN) 600 MG tablet Take 600 mg by mouth 2 (two) times daily.   isosorbide mononitrate (IMDUR) 120 MG 24 hr tablet Take 1 tablet (120 mg total) by mouth daily.   latanoprost (XALATAN) 0.005 % ophthalmic solution Place 1  drop into both eyes at bedtime.   loratadine-pseudoephedrine (CLARITIN-D 24-HOUR) 10-240 MG 24 hr tablet Take 1 tablet by mouth daily as needed for allergies.   Multiple Vitamins-Minerals (CENTRUM SILVER PO) Take by mouth.   nitroGLYCERIN (NITROSTAT) 0.4 MG SL tablet Place 1 tablet (0.4 mg total) under the tongue every 5 (five) minutes as needed for chest pain (max 3 doses).   omeprazole (PRILOSEC) 20 MG capsule Take 20 mg by mouth daily.   polyethylene glycol powder (GLYCOLAX/MIRALAX) 17 GM/SCOOP powder Take 17 g by mouth daily.   pravastatin (PRAVACHOL) 40 MG tablet TAKE 1 TABLET(40 MG) BY MOUTH EVERY EVENING   predniSONE (DELTASONE) 5 MG tablet Take 5 mg by mouth daily.   TAMSULOSIN HCL PO Take 10 mg by mouth daily.   traMADol (ULTRAM) 50 MG tablet Take 1 tablet (50 mg total) by mouth every 12 (twelve) hours as needed.     Allergies:   Topiramate, Atenolol, Lisinopril, and Tramadol-acetaminophen   Social History   Socioeconomic History   Marital status: Widowed    Spouse name: Not on file   Number  of children: Not on file   Years of education: Not on file   Highest education level: 12th grade  Occupational History   Occupation: Retired  Tobacco Use   Smoking status: Former    Packs/day: 0.50    Years: 52.00    Total pack years: 26.00    Types: Cigarettes    Quit date: 05/26/2011    Years since quitting: 10.8    Passive exposure: Past   Smokeless tobacco: Never  Vaping Use   Vaping Use: Never used  Substance and Sexual Activity   Alcohol use: No   Drug use: No   Sexual activity: Not on file  Other Topics Concern   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  Lives in [place] by himself.      Patient writes with his right hand, though uses his left hand for every thing else. He lives alone in a one level home. He drinks 3 cups of coffee a day and 3 7-8 oz sodas a day. He does not exercise.   Social Determinants of Health    Financial Resource Strain: Low Risk  (06/11/2021)   Overall Financial Resource Strain (CARDIA)    Difficulty of Paying Living Expenses: Not hard at all  Food Insecurity: No Food Insecurity (05/29/2021)   Hunger Vital Sign    Worried About Running Out of Food in the Last Year: Never true    Ran Out of Food in the Last Year: Never true  Transportation Needs: No Transportation Needs (06/11/2021)   PRAPARE - Hydrologist (Medical): No    Lack of Transportation (Non-Medical): No  Physical Activity: Inactive (05/29/2021)   Exercise Vital Sign    Days of Exercise per Week: 0 days    Minutes of Exercise per Session: 0 min  Stress: No Stress Concern Present (05/29/2021)   [place]    Feeling of Stress : Not at all  Social Connections: Not on file     Family History: The patient's family history includes Breast cancer in his mother; COPD in his father; Cancer (age of onset: 67) in his brother; Cancer (age of onset: 86) in his brother; Hyperlipidemia in his father and mother; Hypertension in his brother, daughter, father, mother, sister, and son. There is no history of Colon cancer.  ROS:   Please see the history of present illness.    All other systems reviewed and are negative.  EKGs/Labs/Other Studies Reviewed:    The following studies were reviewed today:   Aug 27, 2020 echo EF 65 RV normal No MR Trivial AI   Recent Labs: 06/26/2021: B Natriuretic Peptide 64.3; Magnesium 1.9 03/03/2022: ALT 12; BUN 9; Creatinine, Ser 1.03; Hemoglobin 9.8; Platelets 221; Potassium 3.5; Sodium 143   Recent Lipid Panel    Component Value Date/Time   CHOL 140 02/17/2022 1532   TRIG 205 (H) 02/17/2022 1532   HDL 40 02/17/2022 1532   CHOLHDL 3.5 02/17/2022 1532   CHOLHDL 5 02/06/2013 0741   VLDL 24.4 02/06/2013 0741   LDLCALC 66 02/17/2022 1532    Physical Exam:    VS:  BP (!) 118/58   Pulse 61    Ht '5\' 9"'$  (1.753 m)   Wt 240 lb 9.6 oz (109.1 kg)   SpO2 95%   BMI 35.53 kg/m     Wt Readings from Last 3 Encounters:  03/22/22 240 lb 9.6  oz (109.1 kg)  03/17/22 240 lb (108.9 kg)  03/11/22 239 lb (108.4 kg)     GEN: Well nourished, well developed in no acute distress HEENT: Normal NECK: No JVD; No carotid bruits LYMPHATICS: No lymphadenopathy CARDIAC: RRR, no murmurs, rubs, gallops; Prepectoral pocket well healed. RESPIRATORY:  Clear to auscultation without rales, wheezing or rhonchi  ABDOMEN: Soft, non-tender, non-distended MUSCULOSKELETAL:  No edema; No deformity  SKIN: Warm and dry NEUROLOGIC:  Alert and oriented x 3 PSYCHIATRIC:  Normal affect       ASSESSMENT:    1. PAF (paroxysmal atrial fibrillation) ([place])   2. Complete heart block ([place])   3. Pacemaker   4. Bloody stool    PLAN:    In order of problems listed above:  #Paroxysmal atrial fibrillation Intolerant of anticoagulation long-term given history of GI bleeding.  Discussed role of left atrial appendage occlusion and stroke risk mitigation and he wishes to proceed with evaluation.  -----------------------  I have seen Joel [name] in the office today who is being considered for a Watchman left atrial appendage closure device. I believe they will benefit from this procedure given their history of atrial fibrillation, CHA2DS2-VASc score of 5 and unadjusted ischemic stroke rate of 7.2% per year. Unfortunately, the patient is not felt to be a long term anticoagulation candidate secondary to history of GI bleeding. The patient's chart has been reviewed and I feel that they would be a candidate for short term oral anticoagulation after Watchman implant.   It is my belief that after undergoing a LAA closure procedure, Joel [name] will not need long term anticoagulation which eliminates anticoagulation side effects and major bleeding risk.   Procedural risks for the Watchman implant have been reviewed  with the patient including a 0.5% risk of stroke, <1% risk of perforation and <1% risk of device embolization. Other risks include bleeding, vascular damage, tamponade, worsening renal function, and death. The patient understands these risk and wishes to proceed.     The published clinical data on the safety and effectiveness of WATCHMAN include but are not limited to the following: - Holmes DR, Mechele Claude, Sick P et al. for the PROTECT AF Investigators. Percutaneous closure of the left atrial appendage versus warfarin therapy for prevention of stroke in patients with atrial fibrillation: a randomised non-inferiority trial. Lancet 2009; 374: 534-42. Mechele Claude, Doshi SK, Abelardo Diesel D et al. on behalf of the PROTECT AF Investigators. Percutaneous Left Atrial Appendage Closure for Stroke Prophylaxis in Patients With Atrial Fibrillation 2.3-Year Follow-up of the PROTECT AF (Watchman Left Atrial Appendage System for Embolic Protection in Patients With Atrial Fibrillation) Trial. Circulation 2013; 127:720-729. - Alli O, Doshi S,  Kar S, Reddy VY, Sievert H et al. Quality of Life Assessment in the Randomized PROTECT AF (Percutaneous Closure of the Left Atrial Appendage Versus Warfarin Therapy for Prevention of Stroke in Patients With Atrial Fibrillation) Trial of Patients at Risk for Stroke With Nonvalvular Atrial Fibrillation. J Am Coll Cardiol 2013; 19:3790-2. Vertell Limber DR, Tarri Abernethy, Price M, Hancock, Sievert H, Doshi S, Huber K, Reddy V. Prospective randomized evaluation of the Watchman left atrial appendage Device in patients with atrial fibrillation versus long-term warfarin therapy; the PREVAIL trial. Journal of the [employer] of Cardiology, Vol. 4, No. 1, 2014, 1-11. - Kar S, Doshi SK, Sadhu A, Horton R, Osorio J et al. Primary outcome evaluation of a next-generation left atrial appendage closure device: results from the PINNACLE FLX trial. Circulation  0814;[id number].    After today's  visit with the patient which was dedicated solely for shared decision making visit regarding LAA closure device, the patient decided to proceed with the LAA appendage closure procedure scheduled to be done in the near future at [facility].   No CT will be needed given a September 30 2021 CT of the chest.  I will send the study for reprocessing to see if the anatomy is amenable to left atrial appendage closure.  We will plan to do his implant on DAPT rather than a blood thinner to avoid the risk of GI bleeding which occurred in the past when he was on a NOAC.  I will ask him to continue his aspirin 81 mg by mouth once daily until device implant.  On the day of implant, we will load him with Plavix 300 mg by mouth x 1 and he will continue 75 mg by mouth daily thereafter to complete 6 months of total post implant therapy.    HAS-BLED score 4 Hypertension Yes  Abnormal renal and liver function (Dialysis, transplant, Cr >2.26 mg/dL /Cirrhosis or Bilirubin >2x Normal or AST/ALT/AP >3x Normal) No  Stroke No  Bleeding Yes  Labile INR (Unstable/high INR) No  Elderly (>65) Yes  Drugs or alcohol (? 8 drinks/week, anti-plt or NSAID) Yes   CHA2DS2-VASc Score = 5  The patient's score is based upon: CHF History: 0 HTN History: 1 Diabetes History: 1 Stroke History: 0 Vascular Disease History: 1 Age Score: 2 Gender Score: 0   Medication Adjustments/Labs and Tests Ordered: Current medicines are reviewed at length with the patient today.  Concerns regarding medicines are outlined above.   No orders of the defined types were placed in this encounter.  No orders of the defined types were placed in this encounter.  I,Mathew Stumpf,acting as a Education administrator for Vickie Epley, MD.,have documented all relevant documentation on the behalf of Vickie Epley, MD,as directed by  Vickie Epley, MD while in the presence of Vickie Epley, MD.  I, Vickie Epley, MD, have reviewed all  documentation for this visit. The documentation on 03/22/22 for the exam, diagnosis, procedures, and orders are all accurate and complete.   Signed, Hilton Cork. Quentin Ore, MD, [facility], [facility] 03/22/2022 11:28 AM    Electrophysiology [facility]

## 2022-03-22 NOTE — Patient Instructions (Addendum)
Medication Instructions:  Your physician recommends that you continue on your current medications as directed. Please refer to the Current Medication list given to you today.  *If you need a refill on your cardiac medications before your next appointment, please call your pharmacy*  Testing/Procedures: Your physician has requested that you have an echocardiogram. Echocardiography is a painless test that uses sound waves to create images of your heart. It provides your doctor with information about the size and shape of your heart and how well your heart's chambers and valves are working. This procedure takes approximately one hour. There are no restrictions for this procedure. Please do NOT wear cologne, perfume, aftershave, or lotions (deodorant is allowed). Please arrive 15 minutes prior to your appointment time.  Follow-Up: At [facility], you and your health needs are our priority.  As part of our continuing mission to provide you with exceptional heart care, we have created designated Provider Care Teams.  These Care Teams include your primary Cardiologist (physician) and Advanced Practice Providers (APPs -  Physician Assistants and Nurse Practitioners) who all work together to provide you with the care you need, when you need it.  Your next appointment:   The Nurse Navigator, Valetta Fuller, will be in touch with you to arrange your next appointments.  Important Information About Sugar

## 2022-03-30 ENCOUNTER — Telehealth: Payer: Self-pay

## 2022-03-30 DIAGNOSIS — I480 Paroxysmal atrial fibrillation: Secondary | ICD-10-CM

## 2022-03-30 NOTE — Progress Notes (Signed)
Remote pacemaker transmission.   

## 2022-03-30 NOTE — Telephone Encounter (Signed)
Per Dr. Ulyses Amor consult note, "No CT will be needed given a September 30 2021 CT of the chest. I will send the study for reprocessing to see if the anatomy is amenable to left atrial appendage closure."  Checked with Dr. Audie Box. Unfortunately, the CT was not gated and therefore cannot be used for LAA measurements.  Called and discussed with the patient. He understands a cardiac CT is needed to determine if his anatomy is suitable for LAAO.  He requested for CT to be scheduled in February 2024. Arranged CT 05/19/2022 and mailed instructions per patient request. The patient was grateful for assistance.

## 2022-04-03 ENCOUNTER — Encounter (HOSPITAL_COMMUNITY): Payer: Self-pay

## 2022-04-03 ENCOUNTER — Ambulatory Visit (HOSPITAL_COMMUNITY)
Admission: EM | Admit: 2022-04-03 | Discharge: 2022-04-03 | Disposition: A | Discharge status: Home / Self Care | Payer: Medicare HMO | Attending: Emergency Medicine | Admitting: Emergency Medicine

## 2022-04-03 DIAGNOSIS — R42 Dizziness and giddiness: Secondary | ICD-10-CM | POA: Diagnosis not present

## 2022-04-03 NOTE — ED Provider Notes (Signed)
[facility]    CSN: [id number] Arrival date & time: 04/03/22  1113      History   Chief Complaint Chief Complaint  Patient presents with   Dizziness    HPI Joel [name] is a 81 y.o. male.   Patient presents for evaluation of an episode of " feeling off" and dizziness that lasted approximately 15 minutes before spontaneous resolution.  Patient endorses symptoms began while he was driving, returning home on onset of symptoms.  Unable to describe if dizziness felt as if he was off balance or if the room was moving, endorses that "he just did not feel right" symptoms have not occurred prior.  Denies lightheadedness, visual disturbance, memory or speech changes, weakness, chest pain or shortness of breath.  At this time symptoms have resolved and patient is endorsing that he is feeling better.  History of CAD with stent placement, pacemaker, A-fib, diabetes, COPD, CKD.  Past Medical History:  Diagnosis Date   Atypical chest pain    CAD (coronary artery disease)    a. Multiple prior evaluations then 05/2012 s/p BMS to OM2. b. Nuc 11/2012 wnl. c. Cath 2015: patent stent, moderate LAD/RCA dz, treated medically.   Cataract    CHEST PAIN UNSPECIFIED 09/13/2008   Qualifier: Diagnosis of  By: Rose Fillers, RN, Heather     CKD (chronic kidney disease), stage II    COPD (chronic obstructive pulmonary disease) (HCC)    Cough secondary to angiotensin converting enzyme inhibitor (ACE-I)    Diabetes mellitus ([place]) 09/07/2019   Diabetes mellitus without complication (HCC)    Diverticulosis    DVT (deep venous thrombosis) (HCC)    Family history of breast cancer in first degree relative    GERD (gastroesophageal reflux disease)    Headache(784.0)    Hx: of   History of blood transfusion    "I've had 2; don't know what it was related to" (11/09/2012)   HLD (hyperlipidemia)    HTN (hypertension)    Iron deficiency anemia    Lymphoproliferative disorder, low grade B cell ([place]) 05/21/2020    Microcytic anemia    Peripheral vascular disease ([place])    a. L pop-tibial bypass 2011, followed by [facility].   Personal history of prostate cancer    Rotator cuff injury    "left arm; never repaired" (11/09/2012)    Patient Active Problem List   Diagnosis Date Noted   Pulmonary nodule 02/17/2022   Flatulence 01/22/2022   Anemia 01/22/2022   Pacemaker 12/17/2021   Heart block 08/04/2021   AV block, [place] 1 06/26/2021   Constipation, unspecified 05/11/2021   Non-Hodgkin lymphoma, unspecified, unspecified site ([place]) 05/11/2021   Encounter for therapeutic drug level monitoring 05/11/2021   Alpha thalassaemia minor 12/22/2020   Benign hypertensive heart disease 12/22/2020   Contact with and (suspected) exposure to other environmental pollution 12/22/2020   Aortic atherosclerosis ([place]) 07/17/2020   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   Lymphoproliferative disorder, low grade B cell (HCC)-Stage 4 05/21/2020   Chest pain 02/10/2020   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   Carcinoma of prostate ([place]) 09/06/2019   Chest pain of uncertain etiology    Sleep related hypoxia 04/03/2019   Obesity (BMI 30-39.9) 04/03/2019   Snoring 04/03/2019   At risk for sleep apnea 01/10/2019   Daytime somnolence 01/10/2019   Controlled type 2 diabetes mellitus with hyperglycemia, without long-term current use of insulin ([place]) 12/11/2018   Hypoxic encephalopathy ([place]) 12/11/2018   Obesity  hypoventilation syndrome ([place]) 12/11/2018   Chronic low back pain 09/11/2018   COPD (chronic obstructive pulmonary disease) (HCC)    Seasonal allergies 07/04/2018   Pulsatile tinnitus 02/17/2018   Dizziness 01/20/2018   Malaise 01/20/2018   Decreased breath sounds 01/20/2018   Needs flu shot 01/20/2018   Chronic nonintractable headache 12/07/2017   Back pain 11/01/2017   Radicular pain of lower extremity 11/01/2017   Prostate cancer ([place]) 11/01/2017   Family history of  breast cancer in first degree relative    Adenomatous polyp of colon 04/12/2017   Elliptocytosis on peripheral blood smear ([place]) 09/13/2016   Anemia due to chronic blood loss 04/12/2014   PVD (peripheral vascular disease) with claudication ([place]) 11/15/2012   Chest pain, localized 11/10/2012   Hypokalemia 11/10/2012   Bradycardia 11/09/2012   Thrombocytopenia ([place]) 06/01/2012   Microcytic anemia    Atherosclerosis of native artery of extremity with intermittent claudication ([place]) 06/16/2011   Stable angina 06/11/2011   CAD (coronary artery disease)    Chest pain, atypical 05/30/2011   Iron deficiency anemia 10/03/2009   DIVERTICULOSIS OF COLON 10/03/2009   WEIGHT LOSS, ABNORMAL 10/03/2009   Headache(784.0) 08/19/2008   TIA 06/05/2008   SHOULDER PAIN, LEFT 10/11/2007   COUGH DUE TO ACE INHIBITORS 05/07/2007   History of tobacco use disorder 04/25/2007   Hyperlipidemia 04/21/2007   Essential hypertension 04/21/2007   GERD 04/21/2007    Past Surgical History:  Procedure Laterality Date   CARDIOVASCULAR STRESS TEST  Aug. 2014   CATARACT EXTRACTION W/ INTRAOCULAR LENS IMPLANT Right ~ 2008   CIRCUMCISION     COLONOSCOPY     CORONARY ANGIOPLASTY WITH STENT PLACEMENT  2014   "1" (11/09/2012)   ILIAC ARTERY ANEURYSM REPAIR     LEFT HEART CATH  05-31-12   LEFT HEART CATH AND CORONARY ANGIOGRAPHY N/A 08/01/2019   Procedure: LEFT HEART CATH AND CORONARY ANGIOGRAPHY;  Surgeon: Burnell Blanks, MD;  Location: [facility];  Service: Cardiovascular;  Laterality: N/A;   LEFT HEART CATHETERIZATION WITH CORONARY ANGIOGRAM N/A 06/01/2011   Procedure: LEFT HEART CATHETERIZATION WITH CORONARY ANGIOGRAM;  Surgeon: Hillary Bow, MD;  Location: [facility] CATH LAB;  Service: Cardiovascular;  Laterality: N/A;   LEFT HEART CATHETERIZATION WITH CORONARY ANGIOGRAM N/A 05/31/2012   Procedure: LEFT HEART CATHETERIZATION WITH CORONARY ANGIOGRAM;  Surgeon: Peter M Martinique, MD;  Location: [facility] CATH LAB;   Service: Cardiovascular;  Laterality: N/A;   LEFT HEART CATHETERIZATION WITH CORONARY ANGIOGRAM N/A 08/01/2013   Procedure: LEFT HEART CATHETERIZATION WITH CORONARY ANGIOGRAM;  Surgeon: Burnell Blanks, MD;  Location: [facility] CATH LAB;  Service: Cardiovascular;  Laterality: N/A;   PACEMAKER IMPLANT N/A 09/03/2021   Procedure: PACEMAKER IMPLANT;  Surgeon: Evans Lance, MD;  Location: [facility];  Service: Cardiovascular;  Laterality: N/A;   PERCUTANEOUS CORONARY STENT INTERVENTION (PCI-S)  05/31/2012   Procedure: PERCUTANEOUS CORONARY STENT INTERVENTION (PCI-S);  Surgeon: Peter M Martinique, MD;  Location: [facility] CATH LAB;  Service: Cardiovascular;;   PR VEIN BYPASS GRAFT,AORTO-FEM-POP Left 10/01/2009   left below knee popliteal artery to posterior tibial artery   SHOULDER ARTHROSCOPY WITH OPEN ROTATOR CUFF REPAIR AND DISTAL CLAVICLE ACROMINECTOMY Left 01/12/2013   Procedure: LEFT SHOULDER ARTHROSCOPY WITH DEBRIDEMENT OPEN DISTAL CLAVICLE RESECTION ,acromioplastyAND ROTATOR CUFF REPAIR;  Surgeon: Yvette Rack., MD;  Location: [facility];  Service: Orthopedics;  Laterality: Left;       Home Medications    Prior to Admission medications   Medication Sig Start Date  End Date Taking? Authorizing Provider  abiraterone acetate (ZYTIGA) 250 MG tablet Take 250 mg by mouth daily.   Yes [provider]  Accu-Chek FastClix Lancets MISC Test once daily 11/23/18  Yes Henson, Vickie L, NP-C  albuterol (VENTOLIN HFA) 108 (90 Base) MCG/ACT inhaler INHALE 2 PUFFS INTO THE LUNGS EVERY 6 HOURS AS NEEDED FOR WHEEZING OR SHORTNESS OF BREATH 03/15/19  Yes Henson, Vickie L, NP-C  amLODipine (NORVASC) 10 MG tablet TAKE 1 TABLET(10 MG) BY MOUTH DAILY 03/22/22  Yes Burnell Blanks, MD  aspirin EC 81 MG tablet Take 1 tablet (81 mg total) by mouth daily. Swallow whole. 03/17/22  Yes Burnell Blanks, MD  brimonidine (ALPHAGAN) 0.2 % ophthalmic solution Place 1 drop into both eyes 2 (two) times daily.  05/09/20  Yes [provider]  carboxymethylcellul-glycerin (REFRESH RELIEVA) 0.5-0.9 % ophthalmic solution Place 1 drop into both eyes daily as needed for dry eyes.   Yes [provider]  diclofenac Sodium (VOLTAREN) 1 % GEL Apply 2 g topically 2 (two) times daily as needed (back pain). 06/16/20  Yes [provider]  gabapentin (NEURONTIN) 600 MG tablet Take 600 mg by mouth 2 (two) times daily.   Yes [provider]  isosorbide mononitrate (IMDUR) 120 MG 24 hr tablet Take 1 tablet (120 mg total) by mouth daily. 02/19/22  Yes Burnell Blanks, MD  latanoprost (XALATAN) 0.005 % ophthalmic solution Place 1 drop into both eyes at bedtime. 02/28/20  Yes [provider]  loratadine-pseudoephedrine (CLARITIN-D 24-HOUR) 10-240 MG 24 hr tablet Take 1 tablet by mouth daily as needed for allergies.   Yes [provider]  Multiple Vitamins-Minerals (CENTRUM SILVER PO) Take by mouth.   Yes [provider]  nitroGLYCERIN (NITROSTAT) 0.4 MG SL tablet Place 1 tablet (0.4 mg total) under the tongue every 5 (five) minutes as needed for chest pain (max 3 doses). 02/19/22  Yes Burnell Blanks, MD  omeprazole (PRILOSEC) 20 MG capsule Take 20 mg by mouth daily.   Yes [provider]  polyethylene glycol powder (GLYCOLAX/MIRALAX) 17 GM/SCOOP powder Take 17 g by mouth daily. 01/25/22  Yes Tysinger, Camelia Eng, PA-C  pravastatin (PRAVACHOL) 40 MG tablet TAKE 1 TABLET(40 MG) BY MOUTH EVERY EVENING 02/19/22  Yes Burnell Blanks, MD  predniSONE (DELTASONE) 5 MG tablet Take 5 mg by mouth daily. 07/17/20  Yes [provider]  TAMSULOSIN HCL PO Take 10 mg by mouth daily.   Yes [provider]  traMADol (ULTRAM) 50 MG tablet Take 1 tablet (50 mg total) by mouth every 12 (twelve) hours as needed. 02/02/22  Yes Tysinger, Camelia Eng, PA-C  ferrous gluconate (FERGON) 324 MG tablet Take 1 tablet (324 mg total) by mouth daily with  breakfast. 02/22/22   Tysinger, Camelia Eng, PA-C    Family History Family History  Problem Relation Age of Onset   Breast cancer Mother        dx 37s   Hyperlipidemia Mother    Hypertension Mother    COPD Father        deceased   Hyperlipidemia Father    Hypertension Father    Hypertension Sister    Cancer Brother 17       Unknown cancer, possibly stomach   Hypertension Brother    Cancer Brother 7       Unknown, possibly lung   Hypertension Daughter    Hypertension Son    Colon cancer Neg Hx  pt is unclear about family hx    Social History Social History   Tobacco Use   Smoking status: Former    Packs/day: 0.50    Years: 52.00    Total pack years: 26.00    Types: Cigarettes    Quit date: 05/26/2011    Years since quitting: 10.8    Passive exposure: Past   Smokeless tobacco: Never  Vaping Use   Vaping Use: Never used  Substance Use Topics   Alcohol use: No   Drug use: No     Allergies   Topiramate, Atenolol, Lisinopril, and Tramadol-acetaminophen   Review of Systems Review of Systems  Eyes: Negative.   Respiratory: Negative.    Cardiovascular: Negative.   Skin: Negative.   Neurological:  Positive for dizziness. Negative for tremors, seizures, syncope, facial asymmetry, speech difficulty, weakness, light-headedness, numbness and headaches.     Physical Exam Triage Vital Signs ED Triage Vitals  Enc Vitals Group     BP 04/03/22 1129 (!) 107/54     Pulse Rate 04/03/22 1129 65     Resp 04/03/22 1129 16     Temp 04/03/22 1129 (!) 97.4 F (36.3 C)     Temp Source 04/03/22 1129 Oral     SpO2 04/03/22 1129 96 %     Weight 04/03/22 1130 240 lb (108.9 kg)     Height 04/03/22 1130 '5\' 9"'$  (1.753 m)     Head Circumference --      Peak Flow --      Pain Score 04/03/22 1128 3     Pain Loc --      Pain Edu? --      Excl. in [place]? --    No data found.  Updated Vital Signs BP (!) 107/54 (BP Location: Right Arm)   Pulse 65   Temp (!) 97.4 F (36.3 C)  (Oral)   Resp 16   Ht '5\' 9"'$  (1.753 m)   Wt 240 lb (108.9 kg)   SpO2 96%   BMI 35.44 kg/m   Visual Acuity Right Eye Distance:   Left Eye Distance:   Bilateral Distance:    Right Eye Near:   Left Eye Near:    Bilateral Near:     Physical Exam Constitutional:      Appearance: Normal appearance.  Eyes:     Extraocular Movements: Extraocular movements intact.  Cardiovascular:     Rate and Rhythm: Normal rate and regular rhythm.     Pulses: Normal pulses.     Heart sounds: Normal heart sounds.  Pulmonary:     Effort: Pulmonary effort is normal.     Breath sounds: Normal breath sounds.  Neurological:     General: No focal deficit present.     Mental Status: He is alert and oriented to person, place, and time. Mental status is at baseline.     Cranial Nerves: No cranial nerve deficit.     Sensory: No sensory deficit.     Motor: No weakness.     Gait: Gait normal.  Psychiatric:        Mood and Affect: Mood normal.        Behavior: Behavior normal.      UC Treatments / Results  Labs (all labs ordered are listed, but only abnormal results are displayed) Labs Reviewed - No data to display  EKG   Radiology No results found.  Procedures Procedures (including critical care time)  Medications Ordered in UC Medications - No data to display  Initial Impression / Assessment and Plan / UC Course  I have reviewed the triage vital signs and the nursing notes.  Pertinent labs & imaging results that were available during my care of the patient were reviewed by me and considered in my medical decision making (see chart for details).   Dizziness  Vital signs are stable, blood pressure 107/54 in comparison to blood pressures measured at recent office visits, this is patient's baseline, EKG showing AV paced, in comparison to prior EKGs in November 2023, no changes, lungs are clear to auscultation, no abnormalities to the neurological exam, patient is in no signs of distress  nor toxic appearing and his symptoms have resolved will allow patient to return home and monitor closely, given strict precautions that if symptoms return that he is to go to the nearest emergency department for a full workup, given precautions that if he is driving that he is to pull over and if that he is walking or moving around that he is to base a self to a chair floor until symptoms resolved before going to the nearest hospital for immediate evaluation Final Clinical Impressions(s) / UC Diagnoses   Final diagnoses:  None     Discharge Instructions      As this is her first occurrence of dizziness and symptoms have already resolved we will hold off on sending you over to the hospital for further evaluation  Your EKG shows that your heart beating normally  On exam there are no abnormalities neurologically nor are there abnormalities to your ears which are the area of balance in the body  Please monitor closely and if your symptoms return please go to the nearest emergency department for immediate evaluation  If dizziness returns and you are driving please pull over to prevent injury  If dizziness returns and you are up and walking around please find the nearest area and injure self to the ground  Change positions slowly, waiting at least 10 seconds between movements   ED Prescriptions   None    PDMP not reviewed this encounter.   Hans Eden, NP 04/03/22 1251

## 2022-04-03 NOTE — Discharge Instructions (Addendum)
As this is her first occurrence of dizziness and symptoms have already resolved we will hold off on sending you over to the hospital for further evaluation  Your EKG shows that your heart beating normally  On exam there are no abnormalities neurologically nor are there abnormalities to your ears which are the area of balance in the body  Please monitor closely and if your symptoms return please go to the nearest emergency department for immediate evaluation  If dizziness returns and you are driving please pull over to prevent injury  If dizziness returns and you are up and walking around please find the nearest area and injure self to the ground  Change positions slowly, waiting at least 10 seconds between movements

## 2022-04-03 NOTE — ED Triage Notes (Signed)
Chief Complaint: Patient having dizziness onset this morning while driving. States he got light-headed, dizzy, woozy. Patient had at breakfast before leaving his home, had sausage and eggs. No blurred vision, confusion, or disorientation. No visual changes. No numbness tingling or weakness. Slight headache as well with fatigue   Onset: today   Prescriptions or OTC medications tried: No    Sick exposure: No  New foods, medications, or products: No  Recent Travel: No

## 2022-04-16 ENCOUNTER — Ambulatory Visit: Payer: Medicare HMO

## 2022-04-16 ENCOUNTER — Ambulatory Visit (HOSPITAL_COMMUNITY): Payer: Medicare HMO | Attending: Cardiology

## 2022-04-16 DIAGNOSIS — I480 Paroxysmal atrial fibrillation: Secondary | ICD-10-CM | POA: Diagnosis not present

## 2022-04-16 DIAGNOSIS — I442 Atrioventricular block, complete: Secondary | ICD-10-CM | POA: Insufficient documentation

## 2022-04-16 LAB — ECHOCARDIOGRAM COMPLETE
Area-P 1/2: 2.11 cm2
S' Lateral: 2 cm

## 2022-04-17 LAB — BASIC METABOLIC PANEL
BUN/Creatinine Ratio: 14 (ref 10–24)
BUN: 15 mg/dL (ref 8–27)
CO2: 22 mmol/L (ref 20–29)
Calcium: 9.1 mg/dL (ref 8.6–10.2)
Chloride: 101 mmol/L (ref 96–106)
Creatinine, Ser: 1.05 mg/dL (ref 0.76–1.27)
Glucose: 136 mg/dL — ABNORMAL HIGH (ref 70–99)
Potassium: 4.2 mmol/L (ref 3.5–5.2)
Sodium: 139 mmol/L (ref 134–144)
eGFR: 71 mL/min/{1.73_m2} (ref >59)

## 2022-04-19 ENCOUNTER — Encounter (HOSPITAL_COMMUNITY): Payer: Self-pay

## 2022-04-19 ENCOUNTER — Ambulatory Visit (HOSPITAL_COMMUNITY)
Admission: EM | Admit: 2022-04-19 | Discharge: 2022-04-19 | Disposition: A | Discharge status: Home / Self Care | Payer: Medicare HMO | Attending: Internal Medicine | Admitting: Internal Medicine

## 2022-04-19 ENCOUNTER — Other Ambulatory Visit: Payer: Self-pay

## 2022-04-19 DIAGNOSIS — R1013 Epigastric pain: Secondary | ICD-10-CM

## 2022-04-19 DIAGNOSIS — R0789 Other chest pain: Secondary | ICD-10-CM

## 2022-04-19 MED ORDER — ALUM & MAG HYDROXIDE-SIMETH 200-200-20 MG/5ML PO SUSP
ORAL | Status: AC
  Filled 2022-04-19: qty 30

## 2022-04-19 MED ORDER — LIDOCAINE VISCOUS HCL 2 % MT SOLN
15.0000 mL | Freq: Once | OROMUCOSAL | Status: AC
  Administered 2022-04-19: 15 mL via OROMUCOSAL

## 2022-04-19 MED ORDER — LIDOCAINE VISCOUS HCL 2 % MT SOLN
OROMUCOSAL | Status: AC
  Filled 2022-04-19: qty 15

## 2022-04-19 MED ORDER — ALUM & MAG HYDROXIDE-SIMETH 200-200-20 MG/5ML PO SUSP
30.0000 mL | Freq: Once | ORAL | Status: AC
  Administered 2022-04-19: 30 mL via ORAL

## 2022-04-19 MED ORDER — FAMOTIDINE 20 MG PO TABS: 20.0000 mg | ORAL_TABLET | Freq: Every day | ORAL | 0 refills | Status: DC

## 2022-04-19 MED ORDER — FAMOTIDINE 20 MG PO TABS: 20.0000 mg | ORAL_TABLET | Freq: Two times a day (BID) | ORAL | 0 refills | Status: DC

## 2022-04-19 NOTE — ED Triage Notes (Signed)
Pt states started belching around 5pm today and it wouldn't go away, drank a coke and helped some. States has been having center chest pain radiating through to his back.

## 2022-04-19 NOTE — Discharge Instructions (Addendum)
I have prescribed famotidine for you to take in the morning 30 minutes before breakfast. This medication will help reduce the amount of acid your stomach makes and therefore improve your reflux symptoms related to acid production.   Chest pain is due to acid reflux.  Avoid spicy or acidic foods like tomatoes, chocolate, coffee, or acidic fruits like oranges as these can trigger symptoms. I have included acid reflux diet education in your packet for your review. Please also allow 2 hours after meals before lying flat to help prevent symptoms.   If your symptoms do not improve in the next 2-3 days with interventions, please return. Please follow up with your primary care provider for further management, evaluation, and ongoing wellness visits. Return or go to the emergency room for severe symptoms of shortness of breath, worsening or uncontrolled abdominal or chest pain, headache, light headedness, feeling faint, nausea, vomiting, bloody vomit or stools, black tarry stools, or any other concerning symptoms. I hope you feel better!

## 2022-04-19 NOTE — ED Provider Notes (Addendum)
[place]    CSN: [id number] Arrival date & time: 04/19/22  1837      History   Chief Complaint Chief Complaint  Patient presents with   Chest Pain    HPI Joel [name] is a 82 y.o. male.   Patient presents urgent care for evaluation of central chest pain that started today at approximately 5 PM while he was working on his car.  He states that he was not participating in heavy physical activity but instead was simply moving some tools around.  After working on his car, he went to go get some groceries at the grocery store and remembers picking up some very heavy groceries with his right arm to bring into his house.  Chest pain persisted throughout this time.  Patient also reports increased belching ever since chest discomfort started and states he has experienced this in the past and has usually been able to belch multiple times and the chest pain will go away.  This time, the chest discomfort is persistent and he describes it as pain to the "middle of his body".  History of COPD, CKD stage II, diabetes, coronary artery disease, and pacemaker insertion (May 2023).  Patient denies associated jaw pain, left arm pain, dizziness, nausea, vomiting, abdominal discomfort, recent intake of spicy/fatty foods, acid reflux symptoms, and cough.  No recent viral URI symptoms or fever/chills.  He has a history of gastroesophageal reflux disease, takes omeprazole.  No recent steroid or NSAID use.  He takes aspirin 81 mg daily for cardiac prevention.  He is a former cigarette smoker and quit approximately 11 years ago.  Denies drug use.  Denies weakness, dizziness, and heart palpitations sensation.  He has not attempted use of any over-the-counter medications prior to arrival urgent care for symptoms.   Chest Pain   Past Medical History:  Diagnosis Date   Atypical chest pain    CAD (coronary artery disease)    a. Multiple prior evaluations then 05/2012 s/p BMS to OM2. b. Nuc 11/2012 wnl.  c. Cath 2015: patent stent, moderate LAD/RCA dz, treated medically.   Cataract    CHEST PAIN UNSPECIFIED 09/13/2008   Qualifier: Diagnosis of  By: Rose Fillers, RN, Heather     CKD (chronic kidney disease), stage II    COPD (chronic obstructive pulmonary disease) (HCC)    Cough secondary to angiotensin converting enzyme inhibitor (ACE-I)    Diabetes mellitus ([place]) 09/07/2019   Diabetes mellitus without complication (HCC)    Diverticulosis    DVT (deep venous thrombosis) (HCC)    Family history of breast cancer in first degree relative    GERD (gastroesophageal reflux disease)    Headache(784.0)    Hx: of   History of blood transfusion    "I've had 2; don't know what it was related to" (11/09/2012)   HLD (hyperlipidemia)    HTN (hypertension)    Iron deficiency anemia    Lymphoproliferative disorder, low grade B cell ([place]) 05/21/2020   Microcytic anemia    Peripheral vascular disease ([place])    a. L pop-tibial bypass 2011, followed by [facility].   Personal history of prostate cancer    Rotator cuff injury    "left arm; never repaired" (11/09/2012)    Patient Active Problem List   Diagnosis Date Noted   Pulmonary nodule 02/17/2022   Flatulence 01/22/2022   Anemia 01/22/2022   Pacemaker 12/17/2021   Heart block 08/04/2021   AV block, [place] 1 06/26/2021   Constipation, unspecified 05/11/2021  Non-Hodgkin lymphoma, unspecified, unspecified site ([place]) 05/11/2021   Encounter for therapeutic drug level monitoring 05/11/2021   Alpha thalassaemia minor 12/22/2020   Benign hypertensive heart disease 12/22/2020   Contact with and (suspected) exposure to other environmental pollution 12/22/2020   Aortic atherosclerosis ([place]) 07/17/2020   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   Lymphoproliferative disorder, low grade B cell (HCC)-Stage 4 05/21/2020   Chest pain 02/10/2020   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   Carcinoma of prostate ([place]) 09/06/2019   Chest  pain of uncertain etiology    Sleep related hypoxia 04/03/2019   Obesity (BMI 30-39.9) 04/03/2019   Snoring 04/03/2019   At risk for sleep apnea 01/10/2019   Daytime somnolence 01/10/2019   Controlled type 2 diabetes mellitus with hyperglycemia, without long-term current use of insulin ([place]) 12/11/2018   Hypoxic encephalopathy ([place]) 12/11/2018   Obesity hypoventilation syndrome ([place]) 12/11/2018   Chronic low back pain 09/11/2018   COPD (chronic obstructive pulmonary disease) (HCC)    Seasonal allergies 07/04/2018   Pulsatile tinnitus 02/17/2018   Dizziness 01/20/2018   Malaise 01/20/2018   Decreased breath sounds 01/20/2018   Needs flu shot 01/20/2018   Chronic nonintractable headache 12/07/2017   Back pain 11/01/2017   Radicular pain of lower extremity 11/01/2017   Prostate cancer ([place]) 11/01/2017   Family history of breast cancer in first degree relative    Adenomatous polyp of colon 04/12/2017   Elliptocytosis on peripheral blood smear ([place]) 09/13/2016   Anemia due to chronic blood loss 04/12/2014   PVD (peripheral vascular disease) with claudication ([place]) 11/15/2012   Chest pain, localized 11/10/2012   Hypokalemia 11/10/2012   Bradycardia 11/09/2012   Thrombocytopenia ([place]) 06/01/2012   Microcytic anemia    Atherosclerosis of native artery of extremity with intermittent claudication ([place]) 06/16/2011   Stable angina 06/11/2011   CAD (coronary artery disease)    Chest pain, atypical 05/30/2011   Iron deficiency anemia 10/03/2009   DIVERTICULOSIS OF COLON 10/03/2009   WEIGHT LOSS, ABNORMAL 10/03/2009   Headache(784.0) 08/19/2008   TIA 06/05/2008   SHOULDER PAIN, LEFT 10/11/2007   COUGH DUE TO ACE INHIBITORS 05/07/2007   History of tobacco use disorder 04/25/2007   Hyperlipidemia 04/21/2007   Essential hypertension 04/21/2007   GERD 04/21/2007    Past Surgical History:  Procedure Laterality Date   CARDIOVASCULAR STRESS TEST  Aug. 2014   CATARACT EXTRACTION W/  INTRAOCULAR LENS IMPLANT Right ~ 2008   CIRCUMCISION     COLONOSCOPY     CORONARY ANGIOPLASTY WITH STENT PLACEMENT  2014   "1" (11/09/2012)   ILIAC ARTERY ANEURYSM REPAIR     LEFT HEART CATH  05-31-12   LEFT HEART CATH AND CORONARY ANGIOGRAPHY N/A 08/01/2019   Procedure: LEFT HEART CATH AND CORONARY ANGIOGRAPHY;  Surgeon: Burnell Blanks, MD;  Location: [facility];  Service: Cardiovascular;  Laterality: N/A;   LEFT HEART CATHETERIZATION WITH CORONARY ANGIOGRAM N/A 06/01/2011   Procedure: LEFT HEART CATHETERIZATION WITH CORONARY ANGIOGRAM;  Surgeon: Hillary Bow, MD;  Location[facility] C[facility] LAB;  Service: Cardiovascular;  Laterality: N/A;   LEFT HEART CATHETERIZATION WITH CORONARY ANGIOGRAM N/A 05/31/2012   Procedure: LEFT HEART CATHETERIZATION WITH CORONARY ANGIOGRAM;  Surgeon: Peter M Martinique, MD;  Location: [facility] CATH LAB;  Service: Cardiovascular;  Laterality: N/A;   LEFT HEART CATHETERIZATION WITH CORONARY ANGIOGRAM N/A 08/01/2013   Procedure: LEFT HEART CATHETERIZATION WITH CORONARY ANGIOGRAM;  Surgeon: Burnell Blanks, MD;  Location: [facility] CATH LAB;  Service:  Cardiovascular;  Laterality: N/A;   PACEMAKER IMPLANT N/A 09/03/2021   Procedure: PACEMAKER IMPLANT;  Surgeon: Evans Lance, MD;  Location: [place] [facility];  Service: Cardiovascular;  Laterality: N/A;   PERCUTANEOUS CORONARY STENT INTERVENTION (PCI-S)  05/31/2012   Procedure: PERCUTANEOUS CORONARY STENT INTERVENTION (PCI-S);  Surgeon: Peter M Martinique, MD;  Location: [facility] CATH LAB;  Service: Cardiovascular;;   PR VEIN BYPASS GRAFT,AORTO-FEM-POP Left 10/01/2009   left below knee popliteal artery to posterior tibial artery   SHOULDER ARTHROSCOPY WITH OPEN ROTATOR CUFF REPAIR AND DISTAL CLAVICLE ACROMINECTOMY Left 01/12/2013   Procedure: LEFT SHOULDER ARTHROSCOPY WITH DEBRIDEMENT OPEN DISTAL CLAVICLE RESECTION ,acromioplastyAND ROTATOR CUFF REPAIR;  Surgeon: Yvette Rack., MD;  Location: [place];  Service: Orthopedics;   Laterality: Left;       Home Medications    Prior to Admission medications   Medication Sig Start Date End Date Taking? Authorizing Provider  abiraterone acetate (ZYTIGA) 250 MG tablet Take 250 mg by mouth daily.    [provider]  Accu-Chek FastClix Lancets MISC Test once daily 11/23/18   Henson, Vickie L, NP-C  albuterol (VENTOLIN HFA) 108 (90 Base) MCG/ACT inhaler INHALE 2 PUFFS INTO THE LUNGS EVERY 6 HOURS AS NEEDED FOR WHEEZING OR SHORTNESS OF BREATH 03/15/19   Henson, Vickie L, NP-C  amLODipine (NORVASC) 10 MG tablet TAKE 1 TABLET(10 MG) BY MOUTH DAILY 03/22/22   Burnell Blanks, MD  aspirin EC 81 MG tablet Take 1 tablet (81 mg total) by mouth daily. Swallow whole. 03/17/22   Burnell Blanks, MD  brimonidine (ALPHAGAN) 0.2 % ophthalmic solution Place 1 drop into both eyes 2 (two) times daily. 05/09/20   [provider]  carboxymethylcellul-glycerin (REFRESH RELIEVA) 0.5-0.9 % ophthalmic solution Place 1 drop into both eyes daily as needed for dry eyes.    [provider]  diclofenac Sodium (VOLTAREN) 1 % GEL Apply 2 g topically 2 (two) times daily as needed (back pain). 06/16/20   [provider]  famotidine (PEPCID) 20 MG tablet Take 1 tablet (20 mg total) by mouth daily. 04/19/22 05/19/22  Talbot Grumbling, FNP  ferrous gluconate (FERGON) 324 MG tablet Take 1 tablet (324 mg total) by mouth daily with breakfast. 02/22/22   Tysinger, Camelia Eng, PA-C  gabapentin (NEURONTIN) 600 MG tablet Take 600 mg by mouth 2 (two) times daily.    [provider]  isosorbide mononitrate (IMDUR) 120 MG 24 hr tablet Take 1 tablet (120 mg total) by mouth daily. 02/19/22   Burnell Blanks, MD  latanoprost (XALATAN) 0.005 % ophthalmic solution Place 1 drop into both eyes at bedtime. 02/28/20   [provider]  loratadine-pseudoephedrine (CLARITIN-D 24-HOUR) 10-240 MG 24 hr tablet Take 1 tablet by mouth daily as needed for allergies.     [provider]  Multiple Vitamins-Minerals (CENTRUM SILVER PO) Take by mouth.    [provider]  nitroGLYCERIN (NITROSTAT) 0.4 MG SL tablet Place 1 tablet (0.4 mg total) under the tongue every 5 (five) minutes as needed for chest pain (max 3 doses). 02/19/22   Burnell Blanks, MD  omeprazole (PRILOSEC) 20 MG capsule Take 20 mg by mouth daily.    [provider]  polyethylene glycol powder (GLYCOLAX/MIRALAX) 17 GM/SCOOP powder Take 17 g by mouth daily. 01/25/22   Tysinger, Camelia Eng, PA-C  pravastatin (PRAVACHOL) 40 MG tablet TAKE 1 TABLET(40 MG) BY MOUTH EVERY EVENING 02/19/22   Burnell Blanks, MD  predniSONE (DELTASONE) 5 MG tablet Take  5 mg by mouth daily. 07/17/20   [provider]  TAMSULOSIN HCL PO Take 10 mg by mouth daily.    [provider]  traMADol (ULTRAM) 50 MG tablet Take 1 tablet (50 mg total) by mouth every 12 (twelve) hours as needed. 02/02/22   Tysinger, Camelia Eng, PA-C    Family History Family History  Problem Relation Age of Onset   Breast cancer Mother        dx 58s   Hyperlipidemia Mother    Hypertension Mother    COPD Father        deceased   Hyperlipidemia Father    Hypertension Father    Hypertension Sister    Cancer Brother 48       Unknown cancer, possibly stomach   Hypertension Brother    Cancer Brother 4       Unknown, possibly lung   Hypertension Daughter    Hypertension Son    Colon cancer Neg Hx        pt is unclear about family hx    Social History Social History   Tobacco Use   Smoking status: Former    Packs/day: 0.50    Years: 52.00    Total pack years: 26.00    Types: Cigarettes    Quit date: 05/26/2011    Years since quitting: 10.9    Passive exposure: Past   Smokeless tobacco: Never  Vaping Use   Vaping Use: Never used  Substance Use Topics   Alcohol use: No   Drug use: No     Allergies   Topiramate, Atenolol, Lisinopril, and Tramadol-acetaminophen   Review of  Systems Review of Systems  Cardiovascular:  Positive for chest pain.  Per HPI   Physical Exam Triage Vital Signs ED Triage Vitals  Enc Vitals Group     BP 04/19/22 1851 (!) 165/71     Pulse Rate 04/19/22 1851 76     Resp 04/19/22 1851 18     Temp 04/19/22 1851 98 F (36.7 C)     Temp Source 04/19/22 1851 Oral     SpO2 04/19/22 1851 96 %     Weight --      Height --      Head Circumference --      Peak Flow --      Pain Score 04/19/22 1852 3     Pain Loc --      Pain Edu? --      Excl. in [place]? --    No data found.  Updated Vital Signs BP (!) 165/71 (BP Location: Right Arm)   Pulse 76   Temp 98 F (36.7 C) (Oral)   Resp 18   SpO2 96%   Visual Acuity Right Eye Distance:   Left Eye Distance:   Bilateral Distance:    Right Eye Near:   Left Eye Near:    Bilateral Near:     Physical Exam Vitals and nursing note reviewed.  Constitutional:      Appearance: He is not ill-appearing or toxic-appearing.  HENT:     Head: Normocephalic and atraumatic.     Right Ear: Hearing and external ear normal.     Left Ear: Hearing and external ear normal.     Nose: Nose normal.     Mouth/Throat:     Lips: Pink.     Mouth: Mucous membranes are moist.     Pharynx: No oropharyngeal exudate or posterior oropharyngeal erythema.  Eyes:  General: Lids are normal. Vision grossly intact. Gaze aligned appropriately.     Extraocular Movements: Extraocular movements intact.     Conjunctiva/sclera: Conjunctivae normal.     Pupils: Pupils are equal, round, and reactive to light.  Cardiovascular:     Rate and Rhythm: Normal rate and regular rhythm.     Heart sounds: Normal heart sounds, S1 normal and S2 normal.  Pulmonary:     Effort: Pulmonary effort is normal. No respiratory distress.     Breath sounds: Normal air entry. No stridor. No wheezing, rhonchi or rales.  Chest:     Chest wall: No deformity, tenderness or crepitus.     Comments: Chest discomfort is not reproducible to  palpation of the chest wall.  Abdominal:     General: Abdomen is flat. Bowel sounds are normal.     Palpations: Abdomen is soft.     Tenderness: There is no abdominal tenderness. There is no right CVA tenderness, left CVA tenderness or guarding.  Musculoskeletal:     Cervical back: Normal range of motion and neck supple.     Right lower leg: No edema.     Left lower leg: No edema.  Skin:    General: Skin is warm and dry.     Capillary Refill: Capillary refill takes less than 2 seconds.     Findings: No rash.  Neurological:     General: No focal deficit present.     Mental Status: He is alert and oriented to person, place, and time. Mental status is at baseline.     Cranial Nerves: No dysarthria or facial asymmetry.     Motor: No weakness.     Gait: Gait normal.  Psychiatric:        Mood and Affect: Mood normal.        Speech: Speech normal.        Behavior: Behavior normal.        Thought Content: Thought content normal.        Judgment: Judgment normal.      UC Treatments / Results  Labs (all labs ordered are listed, but only abnormal results are displayed) Labs Reviewed - No data to display  EKG   Radiology No results found.  Procedures Procedures (including critical care time)  Medications Ordered in UC Medications  alum & mag hydroxide-simeth (MAALOX/MYLANTA) 200-200-20 MG/5ML suspension 30 mL (30 mLs Oral Given 04/19/22 1924)  lidocaine (XYLOCAINE) 2 % viscous mouth solution 15 mL (15 mLs Mouth/Throat Given 04/19/22 1924)    Initial Impression / Assessment and Plan / UC Course  I have reviewed the triage vital signs and the nursing notes.  Pertinent labs & imaging results that were available during my care of the patient were reviewed by me and considered in my medical decision making (see chart for details).  Dyspepsia, Atypical Chest Pain Patient's symptoms fully resolved after drinking GI cocktail in clinic leading to high suspicion for dyspepsia etiology.  Reports history of GERD. Was taken off of famotidine a few months ago, I'd like for him to start this back today. Reports frequent intake of acidic foods, education provided to reduce spicy/acidic foods in diet to improve symptoms. Follow-up with GI and PCP recommended for ongoing evaluation of symptoms and for further management. EKG shows stable findings without red flag changes when compared to previous EKG on file. Low suspicion for cardiac etiology of patient's chest discomfort. He is agreeable with discharge with strict ER and urgent care return precautions. Deferred imaging  based on hemodynamically stable vital signs and clear lung exam.  Discussed physical exam and available lab work findings in clinic with patient.  Counseled patient regarding appropriate use of medications and potential side effects for all medications recommended or prescribed today. Discussed red flag signs and symptoms of worsening condition,when to call the PCP office, return to urgent care, and when to seek higher level of care in the emergency department. Patient verbalizes understanding and agreement with plan. All questions answered. Patient discharged in stable condition.    Final Clinical Impressions(s) / UC Diagnoses   Final diagnoses:  Dyspepsia  Atypical chest pain     Discharge Instructions      I have prescribed famotidine for you to take in the morning 30 minutes before breakfast. This medication will help reduce the amount of acid your stomach makes and therefore improve your reflux symptoms related to acid production.   Chest pain is due to acid reflux.  Avoid spicy or acidic foods like tomatoes, chocolate, coffee, or acidic fruits like oranges as these can trigger symptoms. I have included acid reflux diet education in your packet for your review. Please also allow 2 hours after meals before lying flat to help prevent symptoms.   If your symptoms do not improve in the next 2-3 days with  interventions, please return. Please follow up with your primary care provider for further management, evaluation, and ongoing wellness visits. Return or go to the emergency room for severe symptoms of shortness of breath, worsening or uncontrolled abdominal or chest pain, headache, light headedness, feeling faint, nausea, vomiting, bloody vomit or stools, black tarry stools, or any other concerning symptoms. I hope you feel better!       ED Prescriptions     Medication Sig Dispense Auth. Provider   famotidine (PEPCID) 20 MG tablet  (Status: Discontinued) Take 1 tablet (20 mg total) by mouth 2 (two) times daily. 60 tablet Talbot Grumbling, FNP   famotidine (PEPCID) 20 MG tablet Take 1 tablet (20 mg total) by mouth daily. 30 tablet Talbot Grumbling, FNP      PDMP not reviewed this encounter.   Talbot Grumbling, FNP 04/24/22 1940    Talbot Grumbling, FNP 04/24/22 1940

## 2022-04-22 ENCOUNTER — Encounter: Payer: Self-pay | Admitting: Internal Medicine

## 2022-04-22 ENCOUNTER — Ambulatory Visit: Payer: Medicare HMO | Admitting: Internal Medicine

## 2022-04-22 ENCOUNTER — Ambulatory Visit (INDEPENDENT_AMBULATORY_CARE_PROVIDER_SITE_OTHER)
Admission: RE | Admit: 2022-04-22 | Discharge: 2022-04-22 | Disposition: A | Discharge status: Home / Self Care | Payer: Medicare HMO | Source: Ambulatory Visit | Attending: Internal Medicine | Admitting: Internal Medicine

## 2022-04-22 VITALS — BP 108/60 | HR 67 | Ht 69.0 in | Wt 242.1 lb

## 2022-04-22 DIAGNOSIS — R159 Full incontinence of feces: Secondary | ICD-10-CM | POA: Diagnosis not present

## 2022-04-22 DIAGNOSIS — K5900 Constipation, unspecified: Secondary | ICD-10-CM | POA: Diagnosis not present

## 2022-04-22 DIAGNOSIS — K648 Other hemorrhoids: Secondary | ICD-10-CM

## 2022-04-22 DIAGNOSIS — R109 Unspecified abdominal pain: Secondary | ICD-10-CM | POA: Diagnosis not present

## 2022-04-22 NOTE — Patient Instructions (Signed)
Please go by the x-ray department in the basement before leaving today.  We will be in touch with results and plans.  I appreciate the opportunity to care for you. Silvano Rusk, MD, [facility]

## 2022-04-22 NOTE — Progress Notes (Signed)
Joel [name] 82 y.o. 10-23-1940 [id number]  Assessment & Plan:   Encounter Diagnoses  Name Primary?   Constipation, unspecified constipation type Yes   Full incontinence of feces    Internal hemorrhoids     He is not impacted, at least not distally on exam today.  Anorectal function appears intact.  I am going to have him do a 2 view abdomen.  I will review that.  Am thinking most likely of giving him a MiraLAX purge to try to reset things and hopefully that will help regulate defecation.  Consider adding fiber supplement to his small dose of MiraLAX.  Note [facility] list indicates senna laxative as needed and he may be using that and not necessarily a stool softener and that may be why he is having urgent defecation and incontinence at times. Subjective:   Chief Complaint: Constipation and incontinence  HPI 82 year old African-American man seen here November 30 by Tye Savoy, NP, he had some transiently bleeding hemorrhoids at that time (she found those in the anoscopy) and had been having constipation difficulty which she is still having.  He takes a very small amount, small spoonful, of MiraLAX daily.  Sometimes at the 2-day mark he still feels like he is not emptied out and is constipated so he will take a stool softener and then he will have fecal incontinence.  That incontinence happens about once a week.  The rectal bleeding he had is gone, he was transiently on Eliquis which he was related to.  He also was prescribed a course of hydrocortisone cream in late November.  His last colonoscopy was in 2019 with 2 small adenomas and diverticulosis.  Procedure performed at [facility].  Denies any medication changes associated with a several month history of constipation difficulty.  He does not have any urinary leakage or GU difficulties.  He mentions that he is on treatment for prostate cancer, he also has a diagnosis of non-Hodgkin's lymphoma.  He is followed at the [facility].  Oncology follow-up note info from 03/31/2022. Diagnosis: 1. Metastatic, castrate sensitive prostate cancer  Diagnosed on biopsy 07/18/15. Path demonstrated 4+4=8, adenocarcinoma.  Imaging 09/02/2015 reviewed above consistent with metastatic disease  (retroperitoneal lymph nodes). Completed 3 months Degarelix then transitioned  to eligard with abiraterone/prednisone. Initial PSA 717 9/18 Treatment:  - Started degarelix 01/05/17 - Repeated degarelix 02/02/17 - Added abiraterone and prednisone 02/02/17.  - Last degarelix 03/04/17 - Initiated Eligard 45 mg 03/30/17 Rx Intent: Palliative.   2. Follicular Lymphoma with orbital involvement. Oncologist- Dr. Alver Sorrow at [facility], transitioned care to [facility] on 02/25/2021 Completed 8 weekly cycles of Rituxan with incomplete response, now on Rituxan  every 8 weeks. Plan for 2 years of maintenance treatment, EOT December  2024.Transitioned care for rituximab infusion to [place] 02/25/2021.  01/23/2021 - FNA of left parotid lesion-atypical lymphoid proliferation. Flow  cytometry not completed due to scant material. Rx Intent: Palliative.   CT CAP: 12/2021 Impression:   Colonic diverticulosis. No acute GI inflammatory process evident.  Nonenhancing renal cysts unchanged. Calyceal calcifications in  the left kidney. No hydronephrosis or ureteral calculi. Mild  concentric wall thickening of the urinary bladder. Few small caliber retrocrural para-aortic lymph nodes unchanged. No new pathologic lymphadenopathy.     He has a chronic microcytic anemia that is really unchanged.  It is not iron deficiency it is consistent with anemia of chronic disease and he also has alpha Thal. BMET 04/16/2021 with normal kidney function electrolytes, glucose is 136.  Wt Readings from Last 3 Encounters:  04/22/22 242 lb 2 oz (109.8 kg)  04/03/22 240 lb (108.9 kg)  03/22/22 240 lb 9.6 oz (109.1 kg)    Allergies  Allergen Reactions   Topiramate Shortness Of Breath  and Other (See Comments)     leg cramps   Atenolol Other (See Comments)    bradycardia   Lisinopril Cough   Tramadol-Acetaminophen Cough    Tolerates plain Tramadol   Current Meds  Medication Sig   abiraterone acetate (ZYTIGA) 250 MG tablet Take 250 mg by mouth daily.   Accu-Chek FastClix Lancets MISC Test once daily   albuterol (VENTOLIN HFA) 108 (90 Base) MCG/ACT inhaler INHALE 2 PUFFS INTO THE LUNGS EVERY 6 HOURS AS NEEDED FOR WHEEZING OR SHORTNESS OF BREATH   amLODipine (NORVASC) 10 MG tablet TAKE 1 TABLET(10 MG) BY MOUTH DAILY   aspirin EC 81 MG tablet Take 1 tablet (81 mg total) by mouth daily. Swallow whole.   brimonidine (ALPHAGAN) 0.2 % ophthalmic solution Place 1 drop into both eyes 2 (two) times daily.   carboxymethylcellul-glycerin (REFRESH RELIEVA) 0.5-0.9 % ophthalmic solution Place 1 drop into both eyes daily as needed for dry eyes.   diclofenac Sodium (VOLTAREN) 1 % GEL Apply 2 g topically 2 (two) times daily as needed (back pain).   famotidine (PEPCID) 20 MG tablet Take 1 tablet (20 mg total) by mouth daily.   ferrous gluconate (FERGON) 324 MG tablet Take 1 tablet (324 mg total) by mouth daily with breakfast.   gabapentin (NEURONTIN) 600 MG tablet Take 600 mg by mouth 2 (two) times daily.   isosorbide mononitrate (IMDUR) 120 MG 24 hr tablet Take 1 tablet (120 mg total) by mouth daily.   latanoprost (XALATAN) 0.005 % ophthalmic solution Place 1 drop into both eyes at bedtime.   loratadine-pseudoephedrine (CLARITIN-D 24-HOUR) 10-240 MG 24 hr tablet Take 1 tablet by mouth daily as needed for allergies.   Multiple Vitamins-Minerals (CENTRUM SILVER PO) Take by mouth.   nitroGLYCERIN (NITROSTAT) 0.4 MG SL tablet Place 1 tablet (0.4 mg total) under the tongue every 5 (five) minutes as needed for chest pain (max 3 doses).   omeprazole (PRILOSEC) 20 MG capsule Take 20 mg by mouth daily.   polyethylene glycol powder (GLYCOLAX/MIRALAX) 17 GM/SCOOP powder Take 17 g by mouth daily.    pravastatin (PRAVACHOL) 40 MG tablet TAKE 1 TABLET(40 MG) BY MOUTH EVERY EVENING   predniSONE (DELTASONE) 5 MG tablet Take 5 mg by mouth daily.   TAMSULOSIN HCL PO Take 10 mg by mouth daily.   traMADol (ULTRAM) 50 MG tablet Take 1 tablet (50 mg total) by mouth every 12 (twelve) hours as needed.   Past Medical History:  Diagnosis Date   Atypical chest pain    CAD (coronary artery disease)    a. Multiple prior evaluations then 05/2012 s/p BMS to OM2. b. Nuc 11/2012 wnl. c. Cath 2015: patent stent, moderate LAD/RCA dz, treated medically.   Cataract    CHEST PAIN UNSPECIFIED 09/13/2008   Qualifier: Diagnosis of  By: Rose Fillers, RN, Heather     CKD (chronic kidney disease), stage II    COPD (chronic obstructive pulmonary disease) (HCC)    Cough secondary to angiotensin converting enzyme inhibitor (ACE-I)    Diabetes mellitus ([place]) 09/07/2019   Diabetes mellitus without complication (HCC)    Diverticulosis    DVT (deep venous thrombosis) (HCC)    Family history of breast cancer in first degree relative    GERD (gastroesophageal reflux disease)  Headache(784.0)    Hx: of   History of blood transfusion    "I've had 2; don't know what it was related to" (11/09/2012)   HLD (hyperlipidemia)    HTN (hypertension)    Iron deficiency anemia    Lymphoproliferative disorder, low grade B cell ([place]) 05/21/2020   Microcytic anemia    Peripheral vascular disease ([place])    a. L pop-tibial bypass 2011, followed by [facility].   Personal history of prostate cancer    Rotator cuff injury    "left arm; never repaired" (11/09/2012)   Past Surgical History:  Procedure Laterality Date   CARDIOVASCULAR STRESS TEST  Aug. 2014   CATARACT EXTRACTION W/ INTRAOCULAR LENS IMPLANT Right ~ 2008   CIRCUMCISION     COLONOSCOPY     CORONARY ANGIOPLASTY WITH STENT PLACEMENT  2014   "1" (11/09/2012)   ILIAC ARTERY ANEURYSM REPAIR     LEFT HEART CATH  05-31-12   LEFT HEART CATH AND CORONARY ANGIOGRAPHY N/A 08/01/2019   Procedure:  LEFT HEART CATH AND CORONARY ANGIOGRAPHY;  Surgeon: Burnell Blanks, MD;  Location: [place] [facility];  Service: Cardiovascular;  Laterality: N/A;   LEFT HEART CATHETERIZATION WITH CORONARY ANGIOGRAM N/A 06/01/2011   Procedure: LEFT HEART CATHETERIZATION WITH CORONARY ANGIOGRAM;  Surgeon: Hillary Bow, MD;  Location: [facility] CATH LAB;  Service: Cardiovascular;  Laterality: N/A;   LEFT HEART CATHETERIZATION WITH CORONARY ANGIOGRAM N/A 05/31/2012   Procedure: LEFT HEART CATHETERIZATION WITH CORONARY ANGIOGRAM;  Surgeon: Peter M Martinique, MD;  Location: [facility] CATH LAB;  Service: Cardiovascular;  Laterality: N/A;   LEFT HEART CATHETERIZATION WITH CORONARY ANGIOGRAM N/A 08/01/2013   Procedure: LEFT HEART CATHETERIZATION WITH CORONARY ANGIOGRAM;  Surgeon: Burnell Blanks, MD;  Location: [facility] CATH LAB;  Service: Cardiovascular;  Laterality: N/A;   PACEMAKER IMPLANT N/A 09/03/2021   Procedure: PACEMAKER IMPLANT;  Surgeon: Evans Lance, MD;  Location: [place] [facility];  Service: Cardiovascular;  Laterality: N/A;   PERCUTANEOUS CORONARY STENT INTERVENTION (PCI-S)  05/31/2012   Procedure: PERCUTANEOUS CORONARY STENT INTERVENTION (PCI-S);  Surgeon: Peter M Martinique, MD;  Location: [facility] CATH LAB;  Service: Cardiovascular;;   PR VEIN BYPASS GRAFT,AORTO-FEM-POP Left 10/01/2009   left below knee popliteal artery to posterior tibial artery   SHOULDER ARTHROSCOPY WITH OPEN ROTATOR CUFF REPAIR AND DISTAL CLAVICLE ACROMINECTOMY Left 01/12/2013   Procedure: LEFT SHOULDER ARTHROSCOPY WITH DEBRIDEMENT OPEN DISTAL CLAVICLE RESECTION ,acromioplastyAND ROTATOR CUFF REPAIR;  Surgeon: Yvette Rack., MD;  Location: [place];  Service: Orthopedics;  Laterality: Left;   Social History   Social History Narrative   Single. Retired. 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  Lives in [place] by himself.      Patient writes with his right hand, though uses his left hand for every thing else. He lives alone in a one level home. He drinks 3 cups  of coffee a day and 3 7-8 oz sodas a day. He does not exercise.   family history includes Breast cancer in his mother; COPD in his father; Cancer (age of onset: 21) in his brother; Cancer (age of onset: 34) in his brother; Hyperlipidemia in his father and mother; Hypertension in his brother, daughter, father, mother, sister, and son.   Review of Systems As per HPI  Objective:   Physical Exam BP 108/60   Pulse 67   Ht '5\' 9"'$  (1.753 m)   Wt 242 lb 2 oz (109.8 kg)   BMI 35.76 kg/m  NAD Abd obese, soft and NT  Rectal - soft brown stool at anus - DRE NL resting and voluntary tone and appropriate simulated defecation, no mass

## 2022-05-12 ENCOUNTER — Emergency Department (HOSPITAL_COMMUNITY): Payer: Medicare HMO

## 2022-05-12 ENCOUNTER — Emergency Department (HOSPITAL_COMMUNITY)
Admission: EM | Admit: 2022-05-12 | Discharge: 2022-05-12 | Disposition: A | Discharge status: Home / Self Care | Payer: Medicare HMO | Attending: Emergency Medicine | Admitting: Emergency Medicine

## 2022-05-12 ENCOUNTER — Other Ambulatory Visit: Payer: Self-pay

## 2022-05-12 DIAGNOSIS — M25512 Pain in left shoulder: Secondary | ICD-10-CM | POA: Insufficient documentation

## 2022-05-12 DIAGNOSIS — E1122 Type 2 diabetes mellitus with diabetic chronic kidney disease: Secondary | ICD-10-CM | POA: Diagnosis not present

## 2022-05-12 DIAGNOSIS — M19012 Primary osteoarthritis, left shoulder: Secondary | ICD-10-CM | POA: Diagnosis not present

## 2022-05-12 DIAGNOSIS — I480 Paroxysmal atrial fibrillation: Secondary | ICD-10-CM | POA: Diagnosis not present

## 2022-05-12 DIAGNOSIS — I129 Hypertensive chronic kidney disease with stage 1 through stage 4 chronic kidney disease, or unspecified chronic kidney disease: Secondary | ICD-10-CM | POA: Diagnosis not present

## 2022-05-12 DIAGNOSIS — Z950 Presence of cardiac pacemaker: Secondary | ICD-10-CM | POA: Insufficient documentation

## 2022-05-12 DIAGNOSIS — I10 Essential (primary) hypertension: Secondary | ICD-10-CM | POA: Insufficient documentation

## 2022-05-12 DIAGNOSIS — J449 Chronic obstructive pulmonary disease, unspecified: Secondary | ICD-10-CM | POA: Diagnosis not present

## 2022-05-12 DIAGNOSIS — Z8572 Personal history of non-Hodgkin lymphomas: Secondary | ICD-10-CM | POA: Diagnosis not present

## 2022-05-12 DIAGNOSIS — E119 Type 2 diabetes mellitus without complications: Secondary | ICD-10-CM | POA: Insufficient documentation

## 2022-05-12 DIAGNOSIS — Z79899 Other long term (current) drug therapy: Secondary | ICD-10-CM | POA: Diagnosis not present

## 2022-05-12 DIAGNOSIS — Z7982 Long term (current) use of aspirin: Secondary | ICD-10-CM | POA: Insufficient documentation

## 2022-05-12 DIAGNOSIS — N189 Chronic kidney disease, unspecified: Secondary | ICD-10-CM | POA: Insufficient documentation

## 2022-05-12 DIAGNOSIS — R079 Chest pain, unspecified: Secondary | ICD-10-CM | POA: Diagnosis not present

## 2022-05-12 LAB — BASIC METABOLIC PANEL
Anion gap: 9 (ref 5–15)
BUN: 7 mg/dL — ABNORMAL LOW (ref 8–23)
CO2: 24 mmol/L (ref 22–32)
Calcium: 9.1 mg/dL (ref 8.9–10.3)
Chloride: 106 mmol/L (ref 98–111)
Creatinine, Ser: 0.99 mg/dL (ref 0.61–1.24)
GFR, Estimated: >60 mL/min (ref >60)
Glucose, Bld: 105 mg/dL — ABNORMAL HIGH (ref 70–99)
Potassium: 3.5 mmol/L (ref 3.5–5.1)
Sodium: 139 mmol/L (ref 135–145)

## 2022-05-12 LAB — PROTIME-INR
INR: 1 (ref 0.8–1.2)
Prothrombin Time: 12.9 seconds (ref 11.4–15.2)

## 2022-05-12 LAB — CBC
HCT: 33.6 % — ABNORMAL LOW (ref 39.0–52.0)
Hemoglobin: 10.3 g/dL — ABNORMAL LOW (ref 13.0–17.0)
MCH: 21.1 pg — ABNORMAL LOW (ref 26.0–34.0)
MCHC: 30.7 g/dL (ref 30.0–36.0)
MCV: 68.9 fL — ABNORMAL LOW (ref 80.0–100.0)
Platelets: 218 10*3/uL (ref 150–400)
RBC: 4.88 MIL/uL (ref 4.22–5.81)
RDW: 17.5 % — ABNORMAL HIGH (ref 11.5–15.5)
WBC: 5.2 10*3/uL (ref 4.0–10.5)
nRBC: 0 % (ref 0.0–0.2)

## 2022-05-12 LAB — TROPONIN I (HIGH SENSITIVITY)
Troponin I (High Sensitivity): 12 ng/L (ref <18)
Troponin I (High Sensitivity): 10 ng/L (ref <18)

## 2022-05-12 MED ORDER — ACETAMINOPHEN 500 MG PO TABS
1000.0000 mg | ORAL_TABLET | Freq: Once | ORAL | Status: AC
  Administered 2022-05-12: 1000 mg via ORAL
  Filled 2022-05-12: qty 2

## 2022-05-12 MED ORDER — DICLOFENAC SODIUM 1 % EX GEL
2.0000 g | Freq: Once | CUTANEOUS | Status: AC
  Administered 2022-05-12: 2 g via TOPICAL
  Filled 2022-05-12: qty 100

## 2022-05-12 NOTE — Discharge Instructions (Addendum)
All the test to your heart look normal today.  You can use the Voltaren gel as needed for the pain in your arm as well as Tylenol or tramadol.  If it is still hurting you next week you should follow-up with your doctor.  If you start getting shortness of breath, chest pain or other concerns return to the emergency room.

## 2022-05-12 NOTE — ED Triage Notes (Signed)
Patient reports left shoulder pain this morning , no SOB , denies emesis or diaphoresis , history of CAD / Coronary stent .

## 2022-05-12 NOTE — ED Triage Notes (Signed)
Pt stated he was about to take his medicine and his shoulder pain hit. Pt denies injury. Pt took 1 tramadol with no relief. Pt has a pacemaker.

## 2022-05-12 NOTE — ED Notes (Signed)
Pt provided water.  

## 2022-05-12 NOTE — ED Notes (Signed)
Pt provided water per EDP approval

## 2022-05-12 NOTE — ED Provider Notes (Signed)
[facility] Provider Note   CSN: [id number] Arrival date & time: 05/12/22  1308     History  Chief Complaint  Patient presents with   Shoulder Pain    Shoulder Pain    Joel [name] is a 82 y.o. male.  Patient is an 82 year old male with a history of diabetes, hypertension, complete heart block status post pacemaker, paroxysmal atrial fibrillation currently undergoing evaluation for a Watchman procedure as he cannot have anticoagulation due to GI bleeding, COPD, CKD and non-Hodgkin's lymphoma who is presenting today due to sudden left shoulder pain.  Patient reports that he woke up around 5 this morning and he was sitting looking on his phone when at 530 he developed a very severe pain in his left shoulder which was worse if he tried to move his arm.  He took a tramadol at home and his normal morning medications.  However he reported it was not improving the pain and he felt like he needed to come get it checked out.  He did check his pacemaker at home and he reports the box was green and did not report any abnormal issues.  He denied feeling short of breath, chest pain, abdominal pain, back pain, numbness in his arm.  He reports he had nerve injury and a rotator cuff issue requiring surgery in that arm years ago but cannot think of anything he would have done to injure or irritate his arm recently.  He reports now the pain is still there when he moves his arm but it is improved from what it had been at home.  The history is provided by the patient and medical records.  Shoulder Pain      Home Medications Prior to Admission medications   Medication Sig Start Date End Date Taking? Authorizing Provider  abiraterone acetate (ZYTIGA) 250 MG tablet Take 250 mg by mouth daily.    [provider]  Accu-Chek FastClix Lancets MISC Test once daily 11/23/18   Henson, Vickie L, NP-C  albuterol (VENTOLIN HFA) 108 (90 Base) MCG/ACT inhaler  INHALE 2 PUFFS INTO THE LUNGS EVERY 6 HOURS AS NEEDED FOR WHEEZING OR SHORTNESS OF BREATH 03/15/19   Henson, Vickie L, NP-C  amLODipine (NORVASC) 10 MG tablet TAKE 1 TABLET(10 MG) BY MOUTH DAILY 03/22/22   Burnell Blanks, MD  aspirin EC 81 MG tablet Take 1 tablet (81 mg total) by mouth daily. Swallow whole. 03/17/22   Burnell Blanks, MD  brimonidine (ALPHAGAN) 0.2 % ophthalmic solution Place 1 drop into both eyes 2 (two) times daily. 05/09/20   [provider]  carboxymethylcellul-glycerin (REFRESH RELIEVA) 0.5-0.9 % ophthalmic solution Place 1 drop into both eyes daily as needed for dry eyes.    [provider]  diclofenac Sodium (VOLTAREN) 1 % GEL Apply 2 g topically 2 (two) times daily as needed (back pain). 06/16/20   [provider]  famotidine (PEPCID) 20 MG tablet Take 1 tablet (20 mg total) by mouth daily. 04/19/22 05/19/22  Talbot Grumbling, FNP  ferrous gluconate (FERGON) 324 MG tablet Take 1 tablet (324 mg total) by mouth daily with breakfast. 02/22/22   Tysinger, Camelia Eng, PA-C  gabapentin (NEURONTIN) 600 MG tablet Take 600 mg by mouth 2 (two) times daily.    [provider]  isosorbide mononitrate (IMDUR) 120 MG 24 hr tablet Take 1 tablet (120 mg total) by mouth daily. 02/19/22   Burnell Blanks, MD  latanoprost (XALATAN) 0.005 %  ophthalmic solution Place 1 drop into both eyes at bedtime. 02/28/20   [provider]  loratadine-pseudoephedrine (CLARITIN-D 24-HOUR) 10-240 MG 24 hr tablet Take 1 tablet by mouth daily as needed for allergies.    [provider]  Multiple Vitamins-Minerals (CENTRUM SILVER PO) Take by mouth.    [provider]  nitroGLYCERIN (NITROSTAT) 0.4 MG SL tablet Place 1 tablet (0.4 mg total) under the tongue every 5 (five) minutes as needed for chest pain (max 3 doses). 02/19/22   Burnell Blanks, MD  omeprazole (PRILOSEC) 20 MG capsule Take 20 mg by mouth daily.    [provider]  polyethylene glycol powder (GLYCOLAX/MIRALAX) 17 GM/SCOOP powder Take 17 g by mouth daily. 01/25/22   Tysinger, Camelia Eng, PA-C  pravastatin (PRAVACHOL) 40 MG tablet TAKE 1 TABLET(40 MG) BY MOUTH EVERY EVENING 02/19/22   Burnell Blanks, MD  predniSONE (DELTASONE) 5 MG tablet Take 5 mg by mouth daily. 07/17/20   [provider]  TAMSULOSIN HCL PO Take 10 mg by mouth daily.    [provider]  traMADol (ULTRAM) 50 MG tablet Take 1 tablet (50 mg total) by mouth every 12 (twelve) hours as needed. 02/02/22   Tysinger, Camelia Eng, PA-C      Allergies    Topiramate, Atenolol, Lisinopril, and Tramadol-acetaminophen    Review of Systems   Review of Systems  Physical Exam Updated Vital Signs BP 124/88 (BP Location: Right Arm)   Pulse 63   Temp 98 F (36.7 C) (Oral)   Resp 14   Ht '5\' 9"'$  (1.753 m)   Wt 108.9 kg   SpO2 95%   BMI 35.44 kg/m  Physical Exam Vitals and nursing note reviewed.  Constitutional:      General: He is not in acute distress.    Appearance: He is well-developed.  HENT:     Head: Normocephalic and atraumatic.  Eyes:     Conjunctiva/sclera: Conjunctivae normal.     Pupils: Pupils are equal, round, and reactive to light.  Cardiovascular:     Rate and Rhythm: Normal rate and regular rhythm.     Pulses: Normal pulses.     Heart sounds: No murmur heard.    Comments: 2+ radial pulses bilaterally Pulmonary:     Effort: Pulmonary effort is normal. No respiratory distress.     Breath sounds: Normal breath sounds. No wheezing or rales.     Comments: Pacemaker box present in the left upper chest without any tenderness around the box or swelling Abdominal:     General: There is no distension.     Palpations: Abdomen is soft.     Tenderness: There is no abdominal tenderness. There is no guarding or rebound.  Musculoskeletal:        General: No tenderness. Normal range of motion.     Cervical back: Normal range of motion and neck supple.      Comments: Minimal tenderness with palpation of the [facility] joint of the left shoulder and over the posterior shoulder in the scapular region.  Pain is also mildly worsened with range of motion of the shoulder but patient has full range of motion.  Normal sensation and strength in left hand grip, biceps, triceps and deltoid function.  Skin:    General: Skin is warm and dry.     Findings: No erythema or rash.  Neurological:     Mental Status: He is alert and oriented to person, place, and time. Mental status is at baseline.  Psychiatric:        Mood and Affect: Mood normal.        Behavior: Behavior normal.     ED Results / Procedures / Treatments   Labs (all labs ordered are listed, but only abnormal results are displayed) Labs Reviewed  BASIC METABOLIC PANEL - Abnormal; Notable for the following components:      Result Value   Glucose, Bld 105 (*)    BUN 7 (*)    All other components within normal limits  CBC - Abnormal; Notable for the following components:   Hemoglobin 10.3 (*)    HCT 33.6 (*)    MCV 68.9 (*)    MCH 21.1 (*)    RDW 17.5 (*)    All other components within normal limits  PROTIME-INR  TROPONIN I (HIGH SENSITIVITY)  TROPONIN I (HIGH SENSITIVITY)    EKG None ED ECG REPORT   Date: 05/12/2022  Rate: 61  Rhythm:  atrial paced  QRS Axis: normal  Intervals:  paced rrhytm  ST/T Wave abnormalities: indeterminate  Conduction Disutrbances:none  Narrative Interpretation:   Old EKG Reviewed: unchanged  I have personally reviewed the EKG tracing and agree with the computerized printout as noted.   Radiology DG Chest Port 1 View  Result Date: 05/12/2022 CLINICAL DATA:  chest pain, ensure pacemaker leads are in appropriate position EXAM: PORTABLE CHEST 1 VIEW COMPARISON:  Radiograph 03/03/2022 FINDINGS: Unchanged dual chamber pacemaker with leads overlying the right atrium and right ventricle. Unchanged size of the cardiomediastinal silhouette. There is no focal  airspace consolidation. There is no pleural effusion or evidence of pneumothorax. There is no acute osseous abnormality. Thoracic spondylosis. IMPRESSION: No evidence of acute cardiopulmonary disease. Unchanged pacemaker leads overlying the right atrium and right ventricle. Electronically Signed   By: Maurine Simmering M.D.   On: 05/12/2022 08:36   DG Shoulder Left  Result Date: 05/12/2022 CLINICAL DATA:  Left shoulder pain. EXAM: LEFT SHOULDER - 2+ VIEW COMPARISON:  None Available. FINDINGS: No fracture. No evidence for shoulder separation or dislocation. Cortical irregularity at the rotator cuff insertion. No worrisome lytic or sclerotic osseous abnormality. IMPRESSION: 1. No acute bony findings. 2. Degenerative changes at the rotator cuff insertion. Electronically Signed   By: Misty Stanley M.D.   On: 05/12/2022 06:54    Procedures Procedures    Medications Ordered in ED Medications  acetaminophen (TYLENOL) tablet 1,000 mg (has no administration in time range)  diclofenac Sodium (VOLTAREN) 1 % topical gel 2 g (2 g Topical Given 05/12/22 4008)    ED Course/ Medical Decision Making/ A&P                             Medical Decision Making Amount and/or Complexity of Data Reviewed External Data Reviewed: notes.    Details: cards Labs: ordered. Decision-making details documented in ED Course. Radiology: ordered and independent interpretation performed. Decision-making details documented in ED Course. ECG/medicine tests: ordered and independent interpretation performed. Decision-making details documented in ED Course.  Risk OTC drugs.   Pt with multiple medical problems and comorbidities and presenting today with a complaint that caries a high risk for morbidity and mortality.  Here today with complaint of sudden onset of left shoulder pain.  Low suspicion for an acute vascular abnormality as patient has 2+ radial pulse, normal sensation and grip in the hand without any signs of swelling.  Low  suspicion for a DVT or arterial occlusion.  Patient's pain is made worse by movement and may be musculoskeletal in nature.  He has had prior surgery on that shoulder but does not recall a significant trauma.  Will ensure no pathologic fracture as patient does have non-Hodgkin's lymphoma.  Lower suspicion for acute cardiac abnormality.  Patient's pacer appears to be functioning appropriately based on his report from home as well his EKG shows a paced rhythm that is unchanged.  Symptoms are not classic for dissection as patient does not have pain that is radiating into the chest, mid back or abdomen and pulses are equal bilaterally, blood pressure is normal patient is well-appearing.  He did have an echo done earlier this month as he is being evaluated for the Watchman procedure which showed a preserved EF and some mild aortic regurgitation with aortic dilation that was mild at the root and in the ascending aorta.  I independently interpreted patient's labs andEKG. CBC, BMP, initial troponin and INR are all within normal limits.  EKG with paced rhythm that is unchanged.  I have independently visualized and interpreted pt's images today.  Shoulder film today without evidence of pathologic fractures or lesions.  Radiology reports degenerative changes of the rotator cuff insertion, chest x-ray with appropriate pacemaker lead placement.  Patient reports now after the medication he took at home the pain is improved.  Will apply Voltaren gel and follow-up on a delta troponin as he presented only an hour and a half after the pain started.  10:16 AM Delta trop is neg.  Pt d/ced home reports his arm still feels much better and improved after voltaren gel.         Final Clinical Impression(s) / ED Diagnoses Final diagnoses:  Acute pain of left shoulder    Rx / DC Orders ED Discharge Orders     None         Blanchie Dessert, MD 05/12/22 1016

## 2022-05-12 NOTE — ED Notes (Signed)
Pt requested some water. RN notified EDP

## 2022-05-13 ENCOUNTER — Inpatient Hospital Stay: Payer: Medicare HMO | Admitting: Medical

## 2022-05-17 ENCOUNTER — Other Ambulatory Visit: Payer: Self-pay | Admitting: Family Medicine

## 2022-05-17 ENCOUNTER — Telehealth: Payer: Self-pay | Admitting: Cardiology

## 2022-05-17 DIAGNOSIS — I25110 Atherosclerotic heart disease of native coronary artery with unstable angina pectoris: Secondary | ICD-10-CM

## 2022-05-17 NOTE — Telephone Encounter (Signed)
Dr. Uvaldo Bristle is calling per [facility] request regarding a peer to peer requested by [facility] regarding a CT code. He reports he believes the wrong code was submitted for auth. Advised Dr. Uvaldo Bristle Dr. Quentin Ore is not in office today, so he requested this message be left with the nurse. Please advise.

## 2022-05-18 ENCOUNTER — Telehealth (HOSPITAL_COMMUNITY): Payer: Self-pay | Admitting: *Deleted

## 2022-05-18 ENCOUNTER — Telehealth: Payer: Self-pay | Admitting: Medical

## 2022-05-18 NOTE — Telephone Encounter (Signed)
Reaching out to patient to offer assistance regarding upcoming cardiac imaging study; pt verbalizes understanding of appt date/time, parking situation and where to check in, pre-test NPO status and verified current allergies; name and call back number provided for further questions should they arise  Iva Montelongo RN Navigator Cardiac Imaging [facility] [phone] office [phone] cell  Patient aware to arrive at 9:30am. 

## 2022-05-18 NOTE — Telephone Encounter (Signed)
Requested records received from [facility]

## 2022-05-19 ENCOUNTER — Ambulatory Visit (HOSPITAL_COMMUNITY): Admission: RE | Admit: 2022-05-19 | Payer: Medicare HMO | Source: Ambulatory Visit

## 2022-05-20 NOTE — Telephone Encounter (Signed)
Left message to call back  

## 2022-05-21 ENCOUNTER — Ambulatory Visit (HOSPITAL_COMMUNITY)
Admission: RE | Admit: 2022-05-21 | Discharge: 2022-05-21 | Disposition: A | Discharge status: Home / Self Care | Payer: Medicare HMO | Source: Ambulatory Visit | Attending: Cardiology | Admitting: Cardiology

## 2022-05-21 DIAGNOSIS — I480 Paroxysmal atrial fibrillation: Secondary | ICD-10-CM | POA: Diagnosis not present

## 2022-05-21 MED ORDER — IOHEXOL 350 MG/ML SOLN
95.0000 mL | Freq: Once | INTRAVENOUS | Status: AC | PRN
  Administered 2022-05-21: 95 mL via INTRAVENOUS

## 2022-05-24 ENCOUNTER — Emergency Department (HOSPITAL_COMMUNITY): Payer: Medicare HMO

## 2022-05-24 ENCOUNTER — Telehealth: Payer: Self-pay

## 2022-05-24 ENCOUNTER — Other Ambulatory Visit: Payer: Self-pay

## 2022-05-24 ENCOUNTER — Encounter (HOSPITAL_COMMUNITY): Payer: Self-pay

## 2022-05-24 ENCOUNTER — Emergency Department (HOSPITAL_COMMUNITY)
Admission: EM | Admit: 2022-05-24 | Discharge: 2022-05-25 | Disposition: A | Discharge status: Home / Self Care | Payer: Medicare HMO | Attending: Emergency Medicine | Admitting: Emergency Medicine

## 2022-05-24 DIAGNOSIS — N200 Calculus of kidney: Secondary | ICD-10-CM | POA: Diagnosis not present

## 2022-05-24 DIAGNOSIS — R195 Other fecal abnormalities: Secondary | ICD-10-CM | POA: Insufficient documentation

## 2022-05-24 DIAGNOSIS — R1030 Lower abdominal pain, unspecified: Secondary | ICD-10-CM | POA: Diagnosis not present

## 2022-05-24 DIAGNOSIS — H0102B Squamous blepharitis left eye, upper and lower eyelids: Secondary | ICD-10-CM | POA: Diagnosis not present

## 2022-05-24 DIAGNOSIS — J449 Chronic obstructive pulmonary disease, unspecified: Secondary | ICD-10-CM | POA: Diagnosis not present

## 2022-05-24 DIAGNOSIS — K5730 Diverticulosis of large intestine without perforation or abscess without bleeding: Secondary | ICD-10-CM | POA: Diagnosis not present

## 2022-05-24 DIAGNOSIS — N182 Chronic kidney disease, stage 2 (mild): Secondary | ICD-10-CM | POA: Insufficient documentation

## 2022-05-24 DIAGNOSIS — Z961 Presence of intraocular lens: Secondary | ICD-10-CM | POA: Diagnosis not present

## 2022-05-24 DIAGNOSIS — I129 Hypertensive chronic kidney disease with stage 1 through stage 4 chronic kidney disease, or unspecified chronic kidney disease: Secondary | ICD-10-CM | POA: Diagnosis not present

## 2022-05-24 DIAGNOSIS — R109 Unspecified abdominal pain: Secondary | ICD-10-CM | POA: Diagnosis present

## 2022-05-24 DIAGNOSIS — H0102A Squamous blepharitis right eye, upper and lower eyelids: Secondary | ICD-10-CM | POA: Diagnosis not present

## 2022-05-24 DIAGNOSIS — Z7982 Long term (current) use of aspirin: Secondary | ICD-10-CM | POA: Diagnosis not present

## 2022-05-24 DIAGNOSIS — R1032 Left lower quadrant pain: Secondary | ICD-10-CM | POA: Diagnosis not present

## 2022-05-24 DIAGNOSIS — E1122 Type 2 diabetes mellitus with diabetic chronic kidney disease: Secondary | ICD-10-CM | POA: Insufficient documentation

## 2022-05-24 DIAGNOSIS — I2510 Atherosclerotic heart disease of native coronary artery without angina pectoris: Secondary | ICD-10-CM | POA: Diagnosis not present

## 2022-05-24 DIAGNOSIS — Z79899 Other long term (current) drug therapy: Secondary | ICD-10-CM | POA: Diagnosis not present

## 2022-05-24 DIAGNOSIS — K921 Melena: Secondary | ICD-10-CM | POA: Diagnosis not present

## 2022-05-24 DIAGNOSIS — H401230 Low-tension glaucoma, bilateral, stage unspecified: Secondary | ICD-10-CM | POA: Diagnosis not present

## 2022-05-24 DIAGNOSIS — C6982 Malignant neoplasm of overlapping sites of left eye and adnexa: Secondary | ICD-10-CM | POA: Diagnosis not present

## 2022-05-24 DIAGNOSIS — I10 Essential (primary) hypertension: Secondary | ICD-10-CM | POA: Diagnosis not present

## 2022-05-24 DIAGNOSIS — H35033 Hypertensive retinopathy, bilateral: Secondary | ICD-10-CM | POA: Diagnosis not present

## 2022-05-24 DIAGNOSIS — R0602 Shortness of breath: Secondary | ICD-10-CM | POA: Diagnosis not present

## 2022-05-24 DIAGNOSIS — Z8546 Personal history of malignant neoplasm of prostate: Secondary | ICD-10-CM | POA: Insufficient documentation

## 2022-05-24 DIAGNOSIS — Z950 Presence of cardiac pacemaker: Secondary | ICD-10-CM | POA: Diagnosis not present

## 2022-05-24 DIAGNOSIS — H43811 Vitreous degeneration, right eye: Secondary | ICD-10-CM | POA: Diagnosis not present

## 2022-05-24 LAB — URINALYSIS, ROUTINE W REFLEX MICROSCOPIC
Bacteria, UA: NONE SEEN
Bilirubin Urine: NEGATIVE
Glucose, UA: NEGATIVE mg/dL
Ketones, ur: NEGATIVE mg/dL
Leukocytes,Ua: NEGATIVE
Nitrite: NEGATIVE
Protein, ur: NEGATIVE mg/dL
Specific Gravity, Urine: 1.003 — ABNORMAL LOW (ref 1.005–1.030)
pH: 6 (ref 5.0–8.0)

## 2022-05-24 LAB — CBC
HCT: 32.9 % — ABNORMAL LOW (ref 39.0–52.0)
Hemoglobin: 10 g/dL — ABNORMAL LOW (ref 13.0–17.0)
MCH: 20.9 pg — ABNORMAL LOW (ref 26.0–34.0)
MCHC: 30.4 g/dL (ref 30.0–36.0)
MCV: 68.7 fL — ABNORMAL LOW (ref 80.0–100.0)
Platelets: 225 10*3/uL (ref 150–400)
RBC: 4.79 MIL/uL (ref 4.22–5.81)
RDW: 17.8 % — ABNORMAL HIGH (ref 11.5–15.5)
WBC: 4.9 10*3/uL (ref 4.0–10.5)
nRBC: 0 % (ref 0.0–0.2)

## 2022-05-24 LAB — COMPREHENSIVE METABOLIC PANEL
ALT: 13 U/L (ref 0–44)
AST: 17 U/L (ref 15–41)
Albumin: 3.7 g/dL (ref 3.5–5.0)
Alkaline Phosphatase: 74 U/L (ref 38–126)
Anion gap: 11 (ref 5–15)
BUN: 11 mg/dL (ref 8–23)
CO2: 24 mmol/L (ref 22–32)
Calcium: 9 mg/dL (ref 8.9–10.3)
Chloride: 104 mmol/L (ref 98–111)
Creatinine, Ser: 1.09 mg/dL (ref 0.61–1.24)
GFR, Estimated: >60 mL/min (ref >60)
Glucose, Bld: 96 mg/dL (ref 70–99)
Potassium: 3.9 mmol/L (ref 3.5–5.1)
Sodium: 139 mmol/L (ref 135–145)
Total Bilirubin: 0.2 mg/dL — ABNORMAL LOW (ref 0.3–1.2)
Total Protein: 6.6 g/dL (ref 6.5–8.1)

## 2022-05-24 LAB — TYPE AND SCREEN
ABO/RH(D): AB POS
Antibody Screen: NEGATIVE

## 2022-05-24 LAB — POC OCCULT BLOOD, ED: Fecal Occult Bld: POSITIVE — AB

## 2022-05-24 LAB — LIPASE, BLOOD: Lipase: 43 U/L (ref 11–51)

## 2022-05-24 MED ORDER — IOHEXOL 350 MG/ML SOLN
80.0000 mL | Freq: Once | INTRAVENOUS | Status: AC | PRN
  Administered 2022-05-24: 80 mL via INTRAVENOUS

## 2022-05-24 NOTE — ED Provider Triage Note (Signed)
Emergency Medicine Provider Triage Evaluation Note  ABHIJEET CLEAVENGER , a 82 y.o. male  was evaluated in triage.  Pt complains of abdominal pain, shortness of breath, and dark black stools reviewed abdominal pain and intermittent shortness of breath started around 8:30 AM today.  Endorses dark bowel movements over the last week.  Denies fever, chills, urinary symptoms, back pain, or chest pain/discomfort.  Hx COPD, HTN, prostate cancer.  Has pacemaker.  Review of Systems  Positive:  Negative: See above  Physical Exam  BP 131/67 (BP Location: Right Arm)   Pulse 71   Temp 98.6 F (37 C) (Oral)   Resp 20   Ht 5' 9"$  (1.753 m)   Wt 109.3 kg   SpO2 95%   BMI 35.59 kg/m  Gen:   Awake, no distress   Resp:  Normal effort  MSK:   Moves extremities without difficulty  Other:  Gait grossly intact.  Abdomen soft, nondistended, mild centralized tenderness.  Equal chest rise, not diaphoretic.  Able to communicate without difficulty.  No wheezing.  Medical Decision Making  Medically screening exam initiated at 8:10 PM.  Appropriate orders placed.  LEVESTER RISTIC was informed that the remainder of the evaluation will be completed by another provider, this initial triage assessment does not replace that evaluation, and the importance of remaining in the ED until their evaluation is complete.     Prince Rome, PA-C [id number]

## 2022-05-24 NOTE — ED Provider Notes (Addendum)
[facility] Provider Note   CSN: [id number] Arrival date & time: 05/24/22  1937     History  Chief Complaint  Patient presents with   Abdominal Pain    Joel [name] is a 82 y.o. male.  Patient with a complaint of lower quadrant abdominal pain that started around the bellybutton.  Symptoms started at about 830 this morning.  Patient also has a history of COPD hypertension and prostate cancer.  Has a pacemaker.  Patient states he has had black-colored stools for the past 3 days.  Several years ago he had a GI bleed with red blood.  They never found a source.  He has no real chest pain no significant shortness of breath.  Past medical history significant for gastroesophageal reflux hyperlipidemia hypertension coronary artery disease peripheral vascular disease has had a left pop tibial bypass in 2011 history of deep vein thrombosis COPD as mentioned diabetes without complications chronic kidney disease stage II and patient is followed almost entirely at the [place] [facility].  So patient has history of prostate cancer for the past several years.  Patient also states his abdominal pain is improving.  Patient has no nausea vomiting or diarrhea.       Home Medications Prior to Admission medications   Medication Sig Start Date End Date Taking? Authorizing Provider  abiraterone acetate (ZYTIGA) 250 MG tablet Take 250 mg by mouth daily.    [provider]  Accu-Chek FastClix Lancets MISC Test once daily 11/23/18   Henson, Vickie L, NP-C  albuterol (VENTOLIN HFA) 108 (90 Base) MCG/ACT inhaler INHALE 2 PUFFS INTO THE LUNGS EVERY 6 HOURS AS NEEDED FOR WHEEZING OR SHORTNESS OF BREATH 03/15/19   Henson, Vickie L, NP-C  amLODipine (NORVASC) 10 MG tablet TAKE 1 TABLET(10 MG) BY MOUTH DAILY 03/22/22   Burnell Blanks, MD  aspirin EC 81 MG tablet Take 1 tablet (81 mg total) by mouth daily. Swallow whole. 03/17/22   Burnell Blanks, MD   brimonidine (ALPHAGAN) 0.2 % ophthalmic solution Place 1 drop into both eyes 2 (two) times daily. 05/09/20   [provider]  carboxymethylcellul-glycerin (REFRESH RELIEVA) 0.5-0.9 % ophthalmic solution Place 1 drop into both eyes daily as needed for dry eyes.    [provider]  diclofenac Sodium (VOLTAREN) 1 % GEL Apply 2 g topically 2 (two) times daily as needed (back pain). 06/16/20   [provider]  famotidine (PEPCID) 20 MG tablet Take 1 tablet (20 mg total) by mouth daily. 04/19/22 05/19/22  Talbot Grumbling, FNP  ferrous gluconate (FERGON) 324 MG tablet Take 1 tablet (324 mg total) by mouth daily with breakfast. 02/22/22   Tysinger, Camelia Eng, PA-C  gabapentin (NEURONTIN) 600 MG tablet Take 600 mg by mouth 2 (two) times daily.    [provider]  isosorbide mononitrate (IMDUR) 120 MG 24 hr tablet Take 1 tablet (120 mg total) by mouth daily. 02/19/22   Burnell Blanks, MD  latanoprost (XALATAN) 0.005 % ophthalmic solution Place 1 drop into both eyes at bedtime. 02/28/20   [provider]  loratadine-pseudoephedrine (CLARITIN-D 24-HOUR) 10-240 MG 24 hr tablet Take 1 tablet by mouth daily as needed for allergies.    [provider]  Multiple Vitamins-Minerals (CENTRUM SILVER PO) Take by mouth.    [provider]  nitroGLYCERIN (NITROSTAT) 0.4 MG SL tablet Place 1 tablet (0.4 mg total) under the tongue every 5 (five) minutes as needed for chest pain (max  3 doses). 02/19/22   Burnell Blanks, MD  omeprazole (PRILOSEC) 20 MG capsule Take 20 mg by mouth daily.    [provider]  polyethylene glycol powder (GLYCOLAX/MIRALAX) 17 GM/SCOOP powder Take 17 g by mouth daily. 01/25/22   Tysinger, Camelia Eng, PA-C  pravastatin (PRAVACHOL) 40 MG tablet TAKE 1 TABLET(40 MG) BY MOUTH EVERY EVENING 02/19/22   Burnell Blanks, MD  predniSONE (DELTASONE) 5 MG tablet Take 5 mg by mouth daily. 07/17/20   [provider]   TAMSULOSIN HCL PO Take 10 mg by mouth daily.    [provider]  traMADol (ULTRAM) 50 MG tablet Take 1 tablet (50 mg total) by mouth every 12 (twelve) hours as needed. 02/02/22   Tysinger, Camelia Eng, PA-C      Allergies    Topiramate, Atenolol, Lisinopril, and Tramadol-acetaminophen    Review of Systems   Review of Systems  Constitutional:  Negative for chills and fever.  HENT:  Negative for ear pain and sore throat.   Eyes:  Negative for pain and visual disturbance.  Respiratory:  Negative for cough and shortness of breath.   Cardiovascular:  Negative for chest pain and palpitations.  Gastrointestinal:  Positive for abdominal pain. Negative for diarrhea, nausea and vomiting.  Genitourinary:  Negative for dysuria and hematuria.  Musculoskeletal:  Negative for arthralgias and back pain.  Skin:  Negative for color change and rash.  Neurological:  Negative for seizures and syncope.  All other systems reviewed and are negative.   Physical Exam Updated Vital Signs BP (!) 154/64   Pulse 60   Temp 98.6 F (37 C) (Oral)   Resp 15   Ht 1.753 m (5' 9"$ )   Wt 109.3 kg   SpO2 98%   BMI 35.59 kg/m  Physical Exam Vitals and nursing note reviewed.  Constitutional:      General: He is not in acute distress.    Appearance: Normal appearance. He is well-developed.  HENT:     Head: Normocephalic and atraumatic.  Eyes:     Extraocular Movements: Extraocular movements intact.     Conjunctiva/sclera: Conjunctivae normal.     Pupils: Pupils are equal, round, and reactive to light.  Cardiovascular:     Rate and Rhythm: Normal rate and regular rhythm.     Heart sounds: No murmur heard. Pulmonary:     Effort: Pulmonary effort is normal. No respiratory distress.     Breath sounds: Normal breath sounds.  Abdominal:     General: There is no distension.     Palpations: Abdomen is soft.     Tenderness: There is no abdominal tenderness. There is no guarding.  Genitourinary:    Rectum:  Guaiac result positive.     Comments: Rectal exam stool brown in color but heme positive.  Not melanotic no gross blood.  No hemorrhoids external or prolapsed internal hemorrhoids no fissure.  No rectal mass. Musculoskeletal:        General: No swelling.     Cervical back: Normal range of motion and neck supple.  Skin:    General: Skin is warm and dry.     Capillary Refill: Capillary refill takes less than 2 seconds.  Neurological:     General: No focal deficit present.     Mental Status: He is alert and oriented to person, place, and time.  Psychiatric:        Mood and Affect: Mood normal.     ED Results / Procedures / Treatments  Labs (all labs ordered are listed, but only abnormal results are displayed) Labs Reviewed  COMPREHENSIVE METABOLIC PANEL - Abnormal; Notable for the following components:      Result Value   Total Bilirubin 0.2 (*)    All other components within normal limits  CBC - Abnormal; Notable for the following components:   Hemoglobin 10.0 (*)    HCT 32.9 (*)    MCV 68.7 (*)    MCH 20.9 (*)    RDW 17.8 (*)    All other components within normal limits  URINALYSIS, ROUTINE W REFLEX MICROSCOPIC - Abnormal; Notable for the following components:   Color, Urine STRAW (*)    Specific Gravity, Urine 1.003 (*)    Hgb urine dipstick SMALL (*)    All other components within normal limits  POC OCCULT BLOOD, ED - Abnormal; Notable for the following components:   Fecal Occult Bld POSITIVE (*)    All other components within normal limits  LIPASE, BLOOD  TYPE AND SCREEN    EKG None  Radiology CT Abdomen Pelvis W Contrast  Result Date: 05/24/2022 CLINICAL DATA:  Left lower quadrant abdominal pain. Difficulty breathing. History of prostate cancer. EXAM: CT ABDOMEN AND PELVIS WITH CONTRAST TECHNIQUE: Multidetector CT imaging of the abdomen and pelvis was performed using the standard protocol following bolus administration of intravenous contrast. RADIATION DOSE  REDUCTION: This exam was performed according to the departmental dose-optimization program which includes automated exposure control, adjustment of the mA and/or kV according to patient size and/or use of iterative reconstruction technique. CONTRAST:  39m OMNIPAQUE IOHEXOL 350 MG/ML SOLN COMPARISON:  08/29/2021. FINDINGS: Lower chest: The heart is normal in size and there is a trace pericardial effusion. Coronary artery calcifications are noted. Pacemaker leads are present in the heart. Mild atelectasis or scarring is noted at the lung bases bilaterally. Hepatobiliary: Stable hypodensities are present in the liver, possible cysts. No biliary ductal dilatation. The gallbladder is without stones. Pancreas: Unremarkable. No pancreatic ductal dilatation or surrounding inflammatory changes. Spleen: Normal in size without focal abnormality. Adrenals/Urinary Tract: The adrenal glands are within normal limits. Renal cortical scarring is present on the right. The kidneys enhance symmetrically. Stable hypodensities are present in the right kidney which are too small to further characterize. Nonobstructive renal calculi are noted on the left. Vascular calcifications are noted on the right. No hydroureteronephrosis bilaterally. Bladder is unremarkable. Stomach/Bowel: There is a small hiatal hernia. Stomach is within normal limits. Appendix appears normal. No evidence of bowel wall thickening, distention, or inflammatory changes. No free air or pneumatosis. Scattered diverticula are present along the colon without evidence of diverticulitis. Vascular/Lymphatic: There is atherosclerotic calcification of the aorta with a endovascular stent graft extending into the common iliac arteries bilaterally. There is mild dilatation of the mid abdominal aorta at the level of the renal arteries measuring 3.3 cm, decreased from the prior exam. There is aneurysmal dilatation of the common iliac artery on the right measuring 4.4 cm, not  significantly changed from the prior exam. Reproductive: Prostate is unremarkable. Other: No abdominopelvic ascites. Fat containing inguinal hernias are noted bilaterally. Musculoskeletal: Degenerative changes in the thoracolumbar spine. Lumbar spinal fusion hardware is noted from L3-L4. No acute or suspicious osseous abnormality. IMPRESSION: 1. No acute intra-abdominal process. 2. Nonobstructive left renal calculi. 3. Diverticulosis without diverticulitis. 4. Small hiatal hernia. 5. Aortic atherosclerosis with dilatation of the juxtarenal aorta measuring 3.3 cm, decreased in size. 6. Right common iliac artery aneurysm measuring 4.4 cm with aortic and iliac  artery endograft, not significantly changed. Electronically Signed   By: Brett Fairy M.D.   On: 05/24/2022 23:49   DG Chest 2 View  Result Date: 05/24/2022 CLINICAL DATA:  Shortness of breath EXAM: CHEST - 2 VIEW COMPARISON:  Chest x-ray 05/12/2022 FINDINGS: Left-sided pacemaker is present. The heart size and mediastinal contours are within normal limits. Both lungs are clear. The visualized skeletal structures are unremarkable. IMPRESSION: No active cardiopulmonary disease. Electronically Signed   By: Ronney Asters M.D.   On: 05/24/2022 20:35    Procedures Procedures    Medications Ordered in ED Medications  iohexol (OMNIPAQUE) 350 MG/ML injection 80 mL (80 mLs Intravenous Contrast Given 05/24/22 2325)    ED Course/ Medical Decision Making/ A&P                             Medical Decision Making Amount and/or Complexity of Data Reviewed Labs: ordered. Radiology: ordered.  Risk Prescription drug management.   With the lower abdomen Donnell pain always need to be concerned about diverticulitis in somebody of this age.  So CT abdomen pelvis ordered.  Patient's stool brown in color but heme positive.  Urinalysis negative.  Lipase normal at 43 complete metabolic panel normal.  Renal function GFR greater than 60.  DBC no leukocytosis  hemoglobin down a little bit at 10 platelets normal 225.  Chest x-ray no acute cardiopulmonary disease.  Patient's EKG looks like a paced rhythm.  If CT abdomen and pelvis is negative patient stable for discharge home.  Will follow-up with his primary care doctor to have his blood counts rechecked.  Stool brown and heme positive no evidence of any gross GI bleed.  CT scan of the abdomen without any acute findings.  There is some peripheral vascular disease but everything is stable.  Diverticulosis but no evidence of diverticulitis.  Final Clinical Impression(s) / ED Diagnoses Final diagnoses:  Lower abdominal pain  Heme positive stool    Rx / DC Orders ED Discharge Orders     None         Fredia Sorrow, MD 05/24/22 2352    Fredia Sorrow, MD 05/24/22 2355

## 2022-05-24 NOTE — Discharge Instructions (Addendum)
Follow-up with your doctors at the [place] [facility] to have your hemoglobin rechecked.  CT scan of the abdomen without any acute findings.  Return for any red blood per rectum or stool turning black.  Today stool was brown in color.

## 2022-05-24 NOTE — ED Triage Notes (Addendum)
Patient arrives POV c/o abdominal pain "around his belly button" and "trouble breathing" that started around 830 this morning. Pt states "he has a touch of COPD," HTN, and prostate CA. Pt has a pacemaker. Pt also states he has "black colored stools" for the past 3 days. Pt a&o x4, no acute distress.

## 2022-05-25 DIAGNOSIS — M5116 Intervertebral disc disorders with radiculopathy, lumbar region: Secondary | ICD-10-CM | POA: Diagnosis not present

## 2022-06-01 ENCOUNTER — Ambulatory Visit (INDEPENDENT_AMBULATORY_CARE_PROVIDER_SITE_OTHER): Payer: Medicare HMO

## 2022-06-01 VITALS — BP 138/62 | HR 87 | Temp 97.8°F | Ht 69.5 in | Wt 238.0 lb

## 2022-06-01 DIAGNOSIS — Z0000 Encounter for general adult medical examination without abnormal findings: Secondary | ICD-10-CM

## 2022-06-01 NOTE — Patient Instructions (Addendum)
Joel [name] , Thank you for taking time to come for your Medicare Wellness Visit. I appreciate your ongoing commitment to your health goals. Please review the following plan we discussed and let me know if I can assist you in the future.   These are the goals we discussed:  Goals      Patient Stated     05/29/2021, no goals     Patient Stated     06/01/2022, no goals     Track and Manage My Blood Pressure-Hypertension     Timeframe:  Long-Range Goal Priority:  High Start Date:                             Expected End Date:                       Follow Up Date 09/12/21    - check blood pressure 3 times per week - choose a place to take my blood pressure (home, clinic or office, retail store) - write blood pressure results in a log or diary    Why is this important?   You won't feel high blood pressure, but it can still hurt your blood vessels.  High blood pressure can cause heart or kidney problems. It can also cause a stroke.  Making lifestyle changes like losing a little weight or eating less salt will help.  Checking your blood pressure at home and at different times of the day can help to control blood pressure.  If the doctor prescribes medicine remember to take it the way the doctor ordered.  Call the office if you cannot afford the medicine or if there are questions about it.     Notes:         This is a list of the screening recommended for you and due dates:  Health Maintenance  Topic Date Due   COVID-19 Vaccine (8 - 2023-24 season) 06/08/2022   Zoster (Shingles) Vaccine (2 of 2) 07/21/2022   Hemoglobin A1C  08/18/2022   Eye exam for diabetics  11/07/2022   Yearly kidney health urinalysis for diabetes  02/18/2023   Complete foot exam   02/18/2023   Yearly kidney function blood test for diabetes  05/25/2023   Medicare Annual Wellness Visit  06/02/2023   DTaP/Tdap/Td vaccine (2 - Td or Tdap) 12/22/2025   Pneumonia Vaccine  Completed   Flu Shot  Completed   HPV  Vaccine  Aged Out    Advanced directives: copy in chart  Conditions/risks identified: none  Next appointment: Follow up in one year for your annual wellness visit.   Preventive Care 32 Years and Older, Male  Preventive care refers to lifestyle choices and visits with your health care provider that can promote health and wellness. What does preventive care include? A yearly physical exam. This is also called an annual well check. Dental exams once or twice a year. Routine eye exams. Ask your health care provider how often you should have your eyes checked. Personal lifestyle choices, including: Daily care of your teeth and gums. Regular physical activity. Eating a healthy diet. Avoiding tobacco and drug use. Limiting alcohol use. Practicing safe sex. Taking low doses of aspirin every day. Taking vitamin and mineral supplements as recommended by your health care provider. What happens during an annual well check? The services and screenings done by your health care provider during your annual well check will depend  on your age, overall health, lifestyle risk factors, and family history of disease. Counseling  Your health care provider may ask you questions about your: Alcohol use. Tobacco use. Drug use. Emotional well-being. Home and relationship well-being. Sexual activity. Eating habits. History of falls. Memory and ability to understand (cognition). Work and work Statistician. Screening  You may have the following tests or measurements: Height, weight, and BMI. Blood pressure. Lipid and cholesterol levels. These may be checked every 5 years, or more frequently if you are over 30 years old. Skin check. Lung cancer screening. You may have this screening every year starting at age 6 if you have a 30-pack-year history of smoking and currently smoke or have quit within the past 15 years. Fecal occult blood test (FOBT) of the stool. You may have this test every year starting  at age 48. Flexible sigmoidoscopy or colonoscopy. You may have a sigmoidoscopy every 5 years or a colonoscopy every 10 years starting at age 53. Prostate cancer screening. Recommendations will vary depending on your family history and other risks. Hepatitis C blood test. Hepatitis B blood test. Sexually transmitted disease (STD) testing. Diabetes screening. This is done by checking your blood sugar (glucose) after you have not eaten for a while (fasting). You may have this done every 1-3 years. Abdominal aortic aneurysm (AAA) screening. You may need this if you are a current or former smoker. Osteoporosis. You may be screened starting at age 42 if you are at high risk. Talk with your health care provider about your test results, treatment options, and if necessary, the need for more tests. Vaccines  Your health care provider may recommend certain vaccines, such as: Influenza vaccine. This is recommended every year. Tetanus, diphtheria, and acellular pertussis (Tdap, Td) vaccine. You may need a Td booster every 10 years. Zoster vaccine. You may need this after age 37. Pneumococcal 13-valent conjugate (PCV13) vaccine. One dose is recommended after age 27. Pneumococcal polysaccharide (PPSV23) vaccine. One dose is recommended after age 49. Talk to your health care provider about which screenings and vaccines you need and how often you need them. This information is not intended to replace advice given to you by your health care provider. Make sure you discuss any questions you have with your health care provider. Document Released: 04/25/2015 Document Revised: 12/17/2015 Document Reviewed: 01/28/2015 Elsevier Interactive Patient Education  2017 [place] Prevention in the Home Falls can cause injuries. They can happen to people of all ages. There are many things you can do to make your home safe and to help prevent falls. What can I do on the outside of my home? Regularly fix the  edges of walkways and driveways and fix any cracks. Remove anything that might make you trip as you walk through a door, such as a raised step or threshold. Trim any bushes or trees on the path to your home. Use bright outdoor lighting. Clear any walking paths of anything that might make someone trip, such as rocks or tools. Regularly check to see if handrails are loose or broken. Make sure that both sides of any steps have handrails. Any raised decks and porches should have guardrails on the edges. Have any leaves, snow, or ice cleared regularly. Use sand or salt on walking paths during winter. Clean up any spills in your garage right away. This includes oil or grease spills. What can I do in the bathroom? Use night lights. Install grab bars by the toilet and in the tub  and shower. Do not use towel bars as grab bars. Use non-skid mats or decals in the tub or shower. If you need to sit down in the shower, use a plastic, non-slip stool. Keep the floor dry. Clean up any water that spills on the floor as soon as it happens. Remove soap buildup in the tub or shower regularly. Attach bath mats securely with double-sided non-slip rug tape. Do not have throw rugs and other things on the floor that can make you trip. What can I do in the bedroom? Use night lights. Make sure that you have a light by your bed that is easy to reach. Do not use any sheets or blankets that are too big for your bed. They should not hang down onto the floor. Have a firm chair that has side arms. You can use this for support while you get dressed. Do not have throw rugs and other things on the floor that can make you trip. What can I do in the kitchen? Clean up any spills right away. Avoid walking on wet floors. Keep items that you use a lot in easy-to-reach places. If you need to reach something above you, use a strong step stool that has a grab bar. Keep electrical cords out of the way. Do not use floor polish or  wax that makes floors slippery. If you must use wax, use non-skid floor wax. Do not have throw rugs and other things on the floor that can make you trip. What can I do with my stairs? Do not leave any items on the stairs. Make sure that there are handrails on both sides of the stairs and use them. Fix handrails that are broken or loose. Make sure that handrails are as long as the stairways. Check any carpeting to make sure that it is firmly attached to the stairs. Fix any carpet that is loose or worn. Avoid having throw rugs at the top or bottom of the stairs. If you do have throw rugs, attach them to the floor with carpet tape. Make sure that you have a light switch at the top of the stairs and the bottom of the stairs. If you do not have them, ask someone to add them for you. What else can I do to help prevent falls? Wear shoes that: Do not have high heels. Have rubber bottoms. Are comfortable and fit you well. Are closed at the toe. Do not wear sandals. If you use a stepladder: Make sure that it is fully opened. Do not climb a closed stepladder. Make sure that both sides of the stepladder are locked into place. Ask someone to hold it for you, if possible. Clearly mark and make sure that you can see: Any grab bars or handrails. First and last steps. Where the edge of each step is. Use tools that help you move around (mobility aids) if they are needed. These include: Canes. Walkers. Scooters. Crutches. Turn on the lights when you go into a dark area. Replace any light bulbs as soon as they burn out. Set up your furniture so you have a clear path. Avoid moving your furniture around. If any of your floors are uneven, fix them. If there are any pets around you, be aware of where they are. Review your medicines with your doctor. Some medicines can make you feel dizzy. This can increase your chance of falling. Ask your doctor what other things that you can do to help prevent falls. This  information is not  intended to replace advice given to you by your health care provider. Make sure you discuss any questions you have with your health care provider. Document Released: 01/23/2009 Document Revised: 09/04/2015 Document Reviewed: 05/03/2014 Elsevier Interactive Patient Education  2017 [employer].

## 2022-06-01 NOTE — Progress Notes (Signed)
Subjective:   Joel [name] is a 82 y.o. male who presents for Medicare Annual/Subsequent preventive examination.  Review of Systems     Cardiac Risk Factors include: advanced age (>39mn, >>41women);dyslipidemia;hypertension;male gender;obesity (BMI >30kg/m2)     Objective:    Today's Vitals   06/01/22 1026  BP: 138/62  Pulse: 87  Temp: 97.8 F (36.6 C)  TempSrc: Oral  SpO2: 94%  Weight: 238 lb (108 kg)  Height: 5' 9.5" (1.765 m)   Body mass index is 34.64 kg/m.     06/01/2022   10:40 AM 05/24/2022    7:51 PM 05/12/2022    7:27 AM 05/12/2022    6:41 AM 03/03/2022    7:38 PM 12/10/2021    8:31 PM 09/29/2021    5:03 PM  Advanced Directives  Does Patient Have a Medical Advance Directive? Yes Yes Yes No No Yes No  Type of Advance Directive Out of facility DNR (pink MOST or yellow form) [place]   Living will   Does patient want to make changes to medical advance directive?   No - Patient declined   No - Patient declined   Would patient like information on creating a medical advance directive?   No - Patient declined  No - Patient declined  No - Patient declined  Pre-existing out of facility DNR order (yellow form or pink MOST form) Pink MOST form placed in chart (order not valid for inpatient use)          Current Medications (verified) Outpatient Encounter Medications as of 06/01/2022  Medication Sig   abiraterone acetate (ZYTIGA) 250 MG tablet Take 250 mg by mouth daily.   Accu-Chek FastClix Lancets MISC Test once daily   albuterol (VENTOLIN HFA) 108 (90 Base) MCG/ACT inhaler INHALE 2 PUFFS INTO THE LUNGS EVERY 6 HOURS AS NEEDED FOR WHEEZING OR SHORTNESS OF BREATH   amLODipine (NORVASC) 10 MG tablet TAKE 1 TABLET(10 MG) BY MOUTH DAILY   aspirin EC 81 MG tablet Take 1 tablet (81 mg total) by mouth daily. Swallow whole.   brimonidine (ALPHAGAN) 0.2 % ophthalmic solution Place 1 drop into both eyes 2 (two) times daily.    carboxymethylcellul-glycerin (REFRESH RELIEVA) 0.5-0.9 % ophthalmic solution Place 1 drop into both eyes daily as needed for dry eyes.   diclofenac Sodium (VOLTAREN) 1 % GEL Apply 2 g topically 2 (two) times daily as needed (back pain).   gabapentin (NEURONTIN) 600 MG tablet Take 600 mg by mouth 2 (two) times daily.   isosorbide mononitrate (IMDUR) 120 MG 24 hr tablet Take 1 tablet (120 mg total) by mouth daily.   latanoprost (XALATAN) 0.005 % ophthalmic solution Place 1 drop into both eyes at bedtime.   loratadine-pseudoephedrine (CLARITIN-D 24-HOUR) 10-240 MG 24 hr tablet Take 1 tablet by mouth daily as needed for allergies.   nitroGLYCERIN (NITROSTAT) 0.4 MG SL tablet Place 1 tablet (0.4 mg total) under the tongue every 5 (five) minutes as needed for chest pain (max 3 doses).   omeprazole (PRILOSEC) 20 MG capsule Take 20 mg by mouth daily.   polyethylene glycol powder (GLYCOLAX/MIRALAX) 17 GM/SCOOP powder Take 17 g by mouth daily.   pravastatin (PRAVACHOL) 40 MG tablet TAKE 1 TABLET(40 MG) BY MOUTH EVERY EVENING   predniSONE (DELTASONE) 5 MG tablet Take 5 mg by mouth daily.   TAMSULOSIN HCL PO Take 10 mg by mouth daily.   traMADol (ULTRAM) 50 MG tablet Take 1 tablet (50 mg total) by  mouth every 12 (twelve) hours as needed.   famotidine (PEPCID) 20 MG tablet Take 1 tablet (20 mg total) by mouth daily.   ferrous gluconate (FERGON) 324 MG tablet Take 1 tablet (324 mg total) by mouth daily with breakfast. (Patient not taking: Reported on 06/01/2022)   Multiple Vitamins-Minerals (CENTRUM SILVER PO) Take by mouth. (Patient not taking: Reported on 06/01/2022)   No facility-administered encounter medications on file as of 06/01/2022.    Allergies (verified) Topiramate, Atenolol, Lisinopril, and Tramadol-acetaminophen   History: Past Medical History:  Diagnosis Date   Atypical chest pain    CAD (coronary artery disease)    a. Multiple prior evaluations then 05/2012 s/p BMS to OM2. b. Nuc 11/2012  wnl. c. Cath 2015: patent stent, moderate LAD/RCA dz, treated medically.   Cataract    CHEST PAIN UNSPECIFIED 09/13/2008   Qualifier: Diagnosis of  By: Rose Fillers, RN, Heather     CKD (chronic kidney disease), stage II    COPD (chronic obstructive pulmonary disease) (HCC)    Cough secondary to angiotensin converting enzyme inhibitor (ACE-I)    Diabetes mellitus ([place]) 09/07/2019   Diabetes mellitus without complication (HCC)    Diverticulosis    DVT (deep venous thrombosis) (HCC)    Family history of breast cancer in first degree relative    GERD (gastroesophageal reflux disease)    Headache(784.0)    Hx: of   History of blood transfusion    "I've had 2; don't know what it was related to" (11/09/2012)   HLD (hyperlipidemia)    HTN (hypertension)    Iron deficiency anemia    Lymphoproliferative disorder, low grade B cell ([place]) 05/21/2020   Microcytic anemia    Peripheral vascular disease ([place])    a. L pop-tibial bypass 2011, followed by [facility].   Personal history of prostate cancer    Rotator cuff injury    "left arm; never repaired" (11/09/2012)   Past Surgical History:  Procedure Laterality Date   CARDIOVASCULAR STRESS TEST  Aug. 2014   CATARACT EXTRACTION W/ INTRAOCULAR LENS IMPLANT Right ~ 2008   CIRCUMCISION     COLONOSCOPY     CORONARY ANGIOPLASTY WITH STENT PLACEMENT  2014   "1" (11/09/2012)   ILIAC ARTERY ANEURYSM REPAIR     LEFT HEART CATH  05-31-12   LEFT HEART CATH AND CORONARY ANGIOGRAPHY N/A 08/01/2019   Procedure: LEFT HEART CATH AND CORONARY ANGIOGRAPHY;  Surgeon: Burnell Blanks, MD;  Location: [place] [facility];  Service: Cardiovascular;  Laterality: N/A;   LEFT HEART CATHETERIZATION WITH CORONARY ANGIOGRAM N/A 06/01/2011   Procedure: LEFT HEART CATHETERIZATION WITH CORONARY ANGIOGRAM;  Surgeon: Hillary Bow, MD;  Location: [facility] CATH LAB;  Service: Cardiovascular;  Laterality: N/A;   LEFT HEART CATHETERIZATION WITH CORONARY ANGIOGRAM N/A 05/31/2012   Procedure: LEFT  HEART CATHETERIZATION WITH CORONARY ANGIOGRAM;  Surgeon: Peter M Martinique, MD;  Location: [facility] C[facility] LAB;  Service: Cardiovascular;  Laterality: N/A;   LEFT HEART CATHETERIZATION WITH CORONARY ANGIOGRAM N/A 08/01/2013   Procedure: LEFT HEART CATHETERIZATION WITH CORONARY ANGIOGRAM;  Surgeon: Burnell Blanks, MD;  Location: [facility] CATH LAB;  Service: Cardiovascular;  Laterality: N/A;   PACEMAKER IMPLANT N/A 09/03/2021   Procedure: PACEMAKER IMPLANT;  Surgeon: Evans Lance, MD;  Location: [place] [facility];  Service: Cardiovascular;  Laterality: N/A;   PERCUTANEOUS CORONARY STENT INTERVENTION (PCI-S)  05/31/2012   Procedure: PERCUTANEOUS CORONARY STENT INTERVENTION (PCI-S);  Surgeon: Peter M Martinique, MD;  Location: [facility] CATH LAB;  Service: Cardiovascular;;  PR VEIN BYPASS GRAFT,AORTO-FEM-POP Left 10/01/2009   left below knee popliteal artery to posterior tibial artery   SHOULDER ARTHROSCOPY WITH OPEN ROTATOR CUFF REPAIR AND DISTAL CLAVICLE ACROMINECTOMY Left 01/12/2013   Procedure: LEFT SHOULDER ARTHROSCOPY WITH DEBRIDEMENT OPEN DISTAL CLAVICLE RESECTION ,acromioplastyAND ROTATOR CUFF REPAIR;  Surgeon: Yvette Rack., MD;  Location: [facility];  Service: Orthopedics;  Laterality: Left;   Family History  Problem Relation Age of Onset   Breast cancer Mother        dx 83s   Hyperlipidemia Mother    Hypertension Mother    COPD Father        deceased   Hyperlipidemia Father    Hypertension Father    Hypertension Sister    Cancer Brother 76       Unknown cancer, possibly stomach   Hypertension Brother    Cancer Brother 98       Unknown, possibly lung   Hypertension Daughter    Hypertension Son    Colon cancer Neg Hx        pt is unclear about family hx   Social History   Socioeconomic History   Marital status: Widowed    Spouse name: Not on file   Number of children: Not on file   Years of education: Not on file   Highest education level: 12th grade  Occupational History   Occupation:  Retired  Tobacco Use   Smoking status: Former    Packs/day: 0.50    Years: 52.00    Total pack years: 26.00    Types: Cigarettes    Quit date: 05/26/2011    Years since quitting: 11.0    Passive exposure: Past   Smokeless tobacco: Never  Vaping Use   Vaping Use: Never used  Substance and Sexual Activity   Alcohol use: No   Drug use: No   Sexual activity: Not on file  Other Topics Concern   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  Lives in [place] by himself.      Patient writes with his right hand, though uses his left hand for every thing else. He lives alone in a one level home. He drinks 3 cups of coffee a day and 3 7-8 oz sodas a day. He does not exercise.   Social Determinants of Health   Financial Resource Strain: Low Risk  (06/01/2022)   Overall Financial Resource Strain (CARDIA)    Difficulty of Paying Living Expenses: Not hard at all  Food Insecurity: No Food Insecurity (06/01/2022)   Hunger Vital Sign    Worried About Running Out of Food in the Last Year: Never true    Ran Out of Food in the Last Year: Never true  Transportation Needs: No Transportation Needs (06/01/2022)   PRAPARE - Hydrologist (Medical): No    Lack of Transportation (Non-Medical): No  Physical Activity: Inactive (06/01/2022)   Exercise Vital Sign    Days of Exercise per Week: 0 days    Minutes of Exercise per Session: 0 min  Stress: No Stress Concern Present (06/01/2022)   Angelina    Feeling of Stress : Not at all  Social Connections: Not on file    Tobacco Counseling Counseling given: Not Answered   Clinical Intake:  Pre-visit preparation completed: Yes  Pain : No/denies pain     Nutritional Status: BMI > 30  Obese Nutritional Risks: None Diabetes: No  How often do you need to have someone help you when you read instructions, pamphlets, or  other written materials from your doctor or pharmacy?: 1 - Never  Diabetic? no  Interpreter Needed?: No  Information entered by :: NAllen LPN   Activities of Daily Living    06/01/2022   10:42 AM 06/27/2021    1:57 AM  In your present state of health, do you have any difficulty performing the following activities:  Hearing? 0 0  Vision? 0 0  Difficulty concentrating or making decisions? 0 0  Walking or climbing stairs? 1 0  Dressing or bathing? 0 0  Doing errands, shopping? 0 0  Preparing Food and eating ? N   Using the Toilet? N   In the past six months, have you accidently leaked urine? N   Do you have problems with loss of bowel control? N   Managing your Medications? N   Managing your Finances? N   Housekeeping or managing your Housekeeping? N     Patient Care Team: Tysinger, Camelia Eng, PA-C as PCP - General (Family Medicine) Burnell Blanks, MD as PCP - Cardiology (Cardiology) Vickie Epley, MD as PCP - Electrophysiology (Cardiology) Burnell Blanks, MD as Consulting Physician (Cardiology) Clinic, Veryl Speak, Mariam Dollar, [place] (Inactive) as Pharmacist (Pharmacist) Evans Lance, MD as Consulting Physician (Cardiology) Warden Fillers, MD as Consulting Physician (Ophthalmology) Orie Rout, MD as Referring Physician (Specialist) Angelia Mould, MD as Consulting Physician (Vascular Surgery) Edrick Kins, DPM as Consulting Physician (Podiatry) Carlean Jews, MD as Referring Physician (Hematology and Oncology)  Indicate any recent Medical Services you may have received from other than [facility] providers in the past year (date may be approximate).     Assessment:   This is a routine wellness examination for Joel [name].  Hearing/Vision screen Vision Screening - Comments:: Regular eye exams, [facility]  Dietary issues and exercise activities discussed: Current Exercise Habits: The patient does not participate in regular  exercise at present   Goals Addressed             This Visit's Progress    Patient Stated       06/01/2022, no goals       Depression Screen    06/01/2022   10:41 AM 12/31/2021   12:11 PM 05/29/2021   10:42 AM 07/17/2020   11:12 AM 09/11/2018   10:03 AM 08/24/2017    9:29 AM 06/05/2014    8:49 AM  PHQ 2/9 Scores  PHQ - 2 Score 0 0 0 0 0 0 0  PHQ- 9 Score 0          Fall Risk    06/01/2022   10:41 AM 12/31/2021   12:11 PM 05/29/2021   10:42 AM 07/17/2020   11:11 AM 09/11/2018   10:03 AM  Fall Risk   Falls in the past year? 0 0 0 0 0  Number falls in past yr: 0 0  0 0  Injury with Fall? 0 0  0 0  Risk for fall due to : Medication side effect;Impaired balance/gait;Impaired mobility No Fall Risks Medication side effect No Fall Risks   Follow up Falls prevention discussed;Education provided;Falls evaluation completed Falls evaluation completed Falls evaluation completed;Education provided;Falls prevention discussed Falls evaluation completed     FALL RISK PREVENTION PERTAINING TO THE HOME:  Any stairs in or around the home? No  If so, are there any without handrails? N/a  Home free of loose throw rugs in walkways, pet beds, electrical cords, etc? Yes  Adequate lighting in your home to reduce risk of falls? Yes   ASSISTIVE DEVICES UTILIZED TO PREVENT FALLS:  Life alert? No  Use of a cane, walker or w/c? Yes  Grab bars in the bathroom? Yes  Shower chair or bench in shower? No  Elevated toilet seat or a handicapped toilet? No   TIMED UP AND GO:  Was the test performed? Yes .  Length of time to ambulate 10 feet: 7 sec.   Gait slow and steady with assistive device  Cognitive Function:        06/01/2022   10:44 AM 05/29/2021   10:44 AM  6CIT Screen  What Year? 0 points 0 points  What month? 0 points 0 points  What time? 0 points 0 points  Count back from 20 0 points 0 points  Months in reverse 0 points 2 points  Repeat phrase 2 points 6 points  Total Score 2 points 8  points    Immunizations Immunization History  Administered Date(s) Administered   Covid-19, Mrna,Vaccine(Spikevax)84yr and older 04/13/2022   Fluad Quad(high Dose 65+) 01/10/2019, 01/16/2021, 12/31/2021   Influenza, High Dose Seasonal PF 02/08/2017, 01/20/2018   Influenza-Unspecified 01/23/2020, 01/10/2021   Moderna Covid-19 Vaccine Bivalent Booster 169yr& up 11/03/2021   Moderna Sars-Covid-2 Vaccination 05/16/2019, 06/13/2019, 02/12/2020, 07/21/2020   Pfizer Covid-19 Vaccine Bivalent Booster 1239yr up 02/24/2021   Pneumococcal Conjugate-13 08/21/2019   Pneumococcal Polysaccharide-23 01/14/2011   Pneumococcal-Unspecified 01/23/2020   Tdap 12/23/2015    TDAP status: Up to date  Flu Vaccine status: Up to date  Pneumococcal vaccine status: Up to date  Covid-19 vaccine status: Completed vaccines  Qualifies for Shingles Vaccine? Yes   Zostavax completed Yes   Shingrix Completed?: Yes  Screening Tests Health Maintenance  Topic Date Due   COVID-19 Vaccine (8 - 2023-24 season) 06/08/2022   Zoster Vaccines- Shingrix (2 of 2) 07/21/2022   HEMOGLOBIN A1C  08/18/2022   OPHTHALMOLOGY EXAM  11/07/2022   Diabetic kidney evaluation - Urine ACR  02/18/2023   FOOT EXAM  02/18/2023   Diabetic kidney evaluation - eGFR measurement  05/25/2023   Medicare Annual Wellness (AWV)  06/02/2023   DTaP/Tdap/Td (2 - Td or Tdap) 12/22/2025   Pneumonia Vaccine 65+79ears old  Completed   INFLUENZA VACCINE  Completed   HPV VACCINES  Aged Out    Health Maintenance  There are no preventive care reminders to display for this patient.   Colorectal cancer screening: No longer required.   Lung Cancer Screening: (Low Dose CT Chest recommended if Age 35-67-80ars, 30 pack-year currently smoking OR have quit w/in 15years.) does not qualify.   Lung Cancer Screening Referral: no  Additional Screening:  Hepatitis C Screening: does not qualify;   Vision Screening: Recommended annual ophthalmology  exams for early detection of glaucoma and other disorders of the eye. Is the patient up to date with their annual eye exam?  Yes  Who is the provider or what is the name of the office in which the patient attends annual eye exams? [facility] If pt is not established with a provider, would they like to be referred to a provider to establish care? No .   Dental Screening: Recommended annual dental exams for proper oral hygiene  Community Resource Referral / Chronic Care Management: CRR required this visit?  No   CCM required this visit?  No  Plan:     I have personally reviewed and noted the following in the patient's chart:   Medical and social history Use of alcohol, tobacco or illicit drugs  Current medications and supplements including opioid prescriptions. Patient is not currently taking opioid prescriptions. Functional ability and status Nutritional status Physical activity Advanced directives List of other physicians Hospitalizations, surgeries, and ER visits in previous 12 months Vitals Screenings to include cognitive, depression, and falls Referrals and appointments  In addition, I have reviewed and discussed with patient certain preventive protocols, quality metrics, and best practice recommendations. A written personalized care plan for preventive services as well as general preventive health recommendations were provided to patient.     [name], LPN   [id number]   Nurse Notes: none

## 2022-06-07 ENCOUNTER — Telehealth: Payer: Self-pay

## 2022-06-07 ENCOUNTER — Other Ambulatory Visit: Payer: Self-pay

## 2022-06-07 DIAGNOSIS — I480 Paroxysmal atrial fibrillation: Secondary | ICD-10-CM

## 2022-06-07 NOTE — Telephone Encounter (Signed)
-----   Message from Vickie Epley, MD sent at 05/29/2022  9:06 AM EST ----- OK to continue with watchman evaluation.   Lysbeth Galas T. Quentin Ore, MD, [facility], [facility] Cardiac Electrophysiology

## 2022-06-07 NOTE — Telephone Encounter (Signed)
The patient wishes to proceed with LAAO on 09/02/2022.  Scheduled him for pre-procedure visit 08/13/2022. He was grateful for call and agreed with plan.

## 2022-06-09 ENCOUNTER — Encounter: Payer: Self-pay | Admitting: Medical

## 2022-06-09 ENCOUNTER — Ambulatory Visit (INDEPENDENT_AMBULATORY_CARE_PROVIDER_SITE_OTHER): Payer: Medicare HMO | Admitting: Medical

## 2022-06-09 VITALS — BP 114/60 | HR 67 | Ht 69.0 in | Wt 241.2 lb

## 2022-06-09 DIAGNOSIS — K648 Other hemorrhoids: Secondary | ICD-10-CM | POA: Diagnosis not present

## 2022-06-09 DIAGNOSIS — K921 Melena: Secondary | ICD-10-CM | POA: Diagnosis not present

## 2022-06-09 DIAGNOSIS — D509 Iron deficiency anemia, unspecified: Secondary | ICD-10-CM

## 2022-06-09 DIAGNOSIS — K5909 Other constipation: Secondary | ICD-10-CM

## 2022-06-09 NOTE — Patient Instructions (Signed)
I think you are having some intermittent bleeding from internal hemorrhoid.    Uses miralax as needed since using it daily makes things too loose.  Begin fiber supplement daily over the counter such as Fibercon or Metamucil.   Continue good water intake.   Begin back on iron supplement daily unless your cardiology needs you to not do this until after procedure.  The plan would be to recheck iron level after begin back on iron for 3 months.   [facility]  Follow up as planned with cardiology for your procedure

## 2022-06-09 NOTE — Progress Notes (Signed)
Subjective:  Joel [name] is a 82 y.o. male who presents for Chief Complaint  Patient presents with   Blood In Stools    For the last week after he has a bowel movement he sees red on the toilet paper. Has been straining when he has a bowel movement.      He notes some bright red blood on toilet paper in the last week.  He had some blood that was more significant few months ago before Eliquis was discontinued  He is taking aspirin daily.  He is on prednisone daily in conjunction with prostate cancer treatment zytiga.    He denies any other bleeding.  No rectal pain or abdominal pain.    He notes ongoing constipation.  Drinks a lot of water.  Uses miralax sometimes but this makes his stool loose and he is more apt to having fecal incontinence with miralax.  Not currently using fiber supplement  Hx/o iron deficiency anemia - not currently taking iron.  He has the watchman procedure coming up soon for afib  No other aggravating or relieving factors.    No other c/o.  Past Medical History:  Diagnosis Date   Atypical chest pain    CAD (coronary artery disease)    a. Multiple prior evaluations then 05/2012 s/p BMS to OM2. b. Nuc 11/2012 wnl. c. Cath 2015: patent stent, moderate LAD/RCA dz, treated medically.   Cataract    CHEST PAIN UNSPECIFIED 09/13/2008   Qualifier: Diagnosis of  By: Rose Fillers, RN, Heather     CKD (chronic kidney disease), stage II    COPD (chronic obstructive pulmonary disease) (HCC)    Cough secondary to angiotensin converting enzyme inhibitor (ACE-I)    Diabetes mellitus ([place]) 09/07/2019   Diabetes mellitus without complication (HCC)    Diverticulosis    DVT (deep venous thrombosis) (HCC)    Family history of breast cancer in first degree relative    GERD (gastroesophageal reflux disease)    Headache(784.0)    Hx: of   History of blood transfusion    "I've had 2; don't know what it was related to" (11/09/2012)   HLD (hyperlipidemia)    HTN (hypertension)     Iron deficiency anemia    Lymphoproliferative disorder, low grade B cell ([place]) 05/21/2020   Microcytic anemia    Peripheral vascular disease ([place])    a. L pop-tibial bypass 2011, followed by [facility].   Personal history of prostate cancer    Rotator cuff injury    "left arm; never repaired" (11/09/2012)   Current Outpatient Medications on File Prior to Visit  Medication Sig Dispense Refill   abiraterone acetate (ZYTIGA) 250 MG tablet Take 250 mg by mouth daily.     Accu-Chek FastClix Lancets MISC Test once daily 102 each 12   amLODipine (NORVASC) 10 MG tablet TAKE 1 TABLET(10 MG) BY MOUTH DAILY 90 tablet 3   aspirin EC 81 MG tablet Take 1 tablet (81 mg total) by mouth daily. Swallow whole. 90 tablet 3   brimonidine (ALPHAGAN) 0.2 % ophthalmic solution Place 1 drop into both eyes 2 (two) times daily.     carboxymethylcellul-glycerin (REFRESH RELIEVA) 0.5-0.9 % ophthalmic solution Place 1 drop into both eyes daily as needed for dry eyes.     diclofenac Sodium (VOLTAREN) 1 % GEL Apply 2 g topically 2 (two) times daily as needed (back pain).     gabapentin (NEURONTIN) 600 MG tablet Take 600 mg by mouth 2 (two) times daily.  isosorbide mononitrate (IMDUR) 120 MG 24 hr tablet Take 1 tablet (120 mg total) by mouth daily. 90 tablet 2   latanoprost (XALATAN) 0.005 % ophthalmic solution Place 1 drop into both eyes at bedtime.     omeprazole (PRILOSEC) 20 MG capsule Take 20 mg by mouth daily.     polyethylene glycol powder (GLYCOLAX/MIRALAX) 17 GM/SCOOP powder Take 17 g by mouth daily. 289 g 0   pravastatin (PRAVACHOL) 40 MG tablet TAKE 1 TABLET(40 MG) BY MOUTH EVERY EVENING 90 tablet 2   predniSONE (DELTASONE) 5 MG tablet Take 5 mg by mouth daily.     tamsulosin (FLOMAX) 0.4 MG CAPS capsule Take 0.4 mg by mouth daily.     albuterol (VENTOLIN HFA) 108 (90 Base) MCG/ACT inhaler INHALE 2 PUFFS INTO THE LUNGS EVERY 6 HOURS AS NEEDED FOR WHEEZING OR SHORTNESS OF BREATH (Patient not taking: Reported on  06/09/2022) 54 g 0   nitroGLYCERIN (NITROSTAT) 0.4 MG SL tablet Place 1 tablet (0.4 mg total) under the tongue every 5 (five) minutes as needed for chest pain (max 3 doses). (Patient not taking: Reported on 06/09/2022) 75 tablet 2   traMADol (ULTRAM) 50 MG tablet Take 1 tablet (50 mg total) by mouth every 12 (twelve) hours as needed. (Patient not taking: Reported on 06/09/2022) 30 tablet 0   No current facility-administered medications on file prior to visit.     The following portions of the patient's history were reviewed and updated as appropriate: allergies, current medications, past family history, past medical history, past social history, past surgical history and problem list.  ROS Otherwise as in subjective above  Objective: BP 114/60   Pulse 67   Ht '5\' 9"'$  (1.753 m)   Wt 241 lb 3.2 oz (109.4 kg)   SpO2 96%   BMI 35.62 kg/m   General appearance: alert, no distress, well developed, well nourished Abdomen: +bs, soft, non tender, non distended, no masses, no hepatomegaly, no splenomegaly Rectal: no external hemorrhoids noted, no obvious fissure   Assessment: Encounter Diagnoses  Name Primary?   Internal hemorrhoid Yes   Blood in stool    Chronic constipation    Iron deficiency anemia, unspecified iron deficiency anemia type      Plan: Internal hemorrhoids likely causing some blood in stool, mild.  Reassured.  I reviewed his GI consult from 04/2022 regarding the same  Chronic constipation - advised continued hydration, add fiber supplement daily.  Can continue miralax prn but daily use causes him some fecal incontinence.    Iron deficiency anemia - add back iron daily.  He has iron supplement at home.  Recheck labs in 67mo [name] was seen today for blood in stools.  Diagnoses and all orders for this visit:  Internal hemorrhoid  Blood in stool  Chronic constipation  Iron deficiency anemia, unspecified iron deficiency anemia type    Follow up: 356mo

## 2022-06-11 ENCOUNTER — Ambulatory Visit: Payer: Medicare HMO

## 2022-06-11 DIAGNOSIS — I442 Atrioventricular block, complete: Secondary | ICD-10-CM | POA: Diagnosis not present

## 2022-06-11 LAB — CUP PACEART REMOTE DEVICE CHECK
Battery Remaining Longevity: 92 mo
Battery Remaining Percentage: 94 %
Battery Voltage: 3.01 V
Brady Statistic AP VP Percent: 45 %
Brady Statistic AP VS Percent: 1 %
Brady Statistic AS VP Percent: 54 %
Brady Statistic AS VS Percent: 1 %
Brady Statistic RA Percent Paced: 45 %
Brady Statistic RV Percent Paced: 99 %
Date Time Interrogation Session: 20240301040015
Implantable Lead Connection Status: 753985
Implantable Lead Connection Status: 753985
Implantable Lead Implant Date: 20230525
Implantable Lead Implant Date: 20230525
Implantable Lead Location: 753859
Implantable Lead Location: 753860
Implantable Pulse Generator Implant Date: 20230525
Lead Channel Impedance Value: 430 Ohm
Lead Channel Impedance Value: 430 Ohm
Lead Channel Pacing Threshold Amplitude: 0.5 V
Lead Channel Pacing Threshold Amplitude: 0.75 V
Lead Channel Pacing Threshold Pulse Width: 0.5 ms
Lead Channel Pacing Threshold Pulse Width: 0.5 ms
Lead Channel Sensing Intrinsic Amplitude: 10.2 mV
Lead Channel Sensing Intrinsic Amplitude: 5 mV
Lead Channel Setting Pacing Amplitude: 2 V
Lead Channel Setting Pacing Amplitude: 2.5 V
Lead Channel Setting Pacing Pulse Width: 0.5 ms
Lead Channel Setting Sensing Sensitivity: 4 mV
Pulse Gen Model: 2272
Pulse Gen Serial Number: 8084442

## 2022-06-15 ENCOUNTER — Telehealth: Payer: Self-pay | Admitting: Internal Medicine

## 2022-06-15 NOTE — Telephone Encounter (Signed)
Patient called states he is having a lot of diarrhea and stomach pain for about two weeks now.

## 2022-06-15 NOTE — Telephone Encounter (Signed)
Pt stated that he has been having issues with constipation and feels like he is not emptying out all the way. Pt stated that has been taking 1/2 capful of MiraLAX  daily. Pt was notified to take Miralax twice daily until a large BM and then once a day. Pt  was notified to call us back in 1 week with a symptom update.  Pt verbalized understanding with all questions answered.

## 2022-06-16 ENCOUNTER — Telehealth: Payer: Self-pay | Admitting: Medical

## 2022-06-16 NOTE — Telephone Encounter (Signed)
Joel [name] was here to see you about a week ago for blood in stool and now he says his stool is jet black, he is wanting to know is this normal or should he come in and be seen again?

## 2022-06-21 ENCOUNTER — Ambulatory Visit: Payer: Medicare HMO | Admitting: Medical

## 2022-06-24 ENCOUNTER — Other Ambulatory Visit: Payer: Self-pay | Admitting: Medical

## 2022-06-24 DIAGNOSIS — G8929 Other chronic pain: Secondary | ICD-10-CM

## 2022-06-24 NOTE — Telephone Encounter (Signed)
Refill request last apt 06/09/22

## 2022-06-26 ENCOUNTER — Emergency Department (HOSPITAL_COMMUNITY): Payer: Medicare HMO

## 2022-06-26 ENCOUNTER — Other Ambulatory Visit: Payer: Self-pay

## 2022-06-26 ENCOUNTER — Emergency Department (HOSPITAL_COMMUNITY)
Admission: EM | Admit: 2022-06-26 | Discharge: 2022-06-26 | Disposition: A | Discharge status: Home / Self Care | Payer: Medicare HMO | Attending: Emergency Medicine | Admitting: Emergency Medicine

## 2022-06-26 ENCOUNTER — Encounter (HOSPITAL_COMMUNITY): Payer: Self-pay

## 2022-06-26 DIAGNOSIS — R079 Chest pain, unspecified: Secondary | ICD-10-CM | POA: Diagnosis not present

## 2022-06-26 DIAGNOSIS — J449 Chronic obstructive pulmonary disease, unspecified: Secondary | ICD-10-CM | POA: Insufficient documentation

## 2022-06-26 DIAGNOSIS — Z7982 Long term (current) use of aspirin: Secondary | ICD-10-CM | POA: Diagnosis not present

## 2022-06-26 DIAGNOSIS — Z79899 Other long term (current) drug therapy: Secondary | ICD-10-CM | POA: Diagnosis not present

## 2022-06-26 DIAGNOSIS — I2510 Atherosclerotic heart disease of native coronary artery without angina pectoris: Secondary | ICD-10-CM | POA: Diagnosis not present

## 2022-06-26 DIAGNOSIS — R0789 Other chest pain: Secondary | ICD-10-CM | POA: Diagnosis not present

## 2022-06-26 LAB — BASIC METABOLIC PANEL
Anion gap: 12 (ref 5–15)
BUN: 10 mg/dL (ref 8–23)
CO2: 24 mmol/L (ref 22–32)
Calcium: 9.4 mg/dL (ref 8.9–10.3)
Chloride: 101 mmol/L (ref 98–111)
Creatinine, Ser: 1.21 mg/dL (ref 0.61–1.24)
GFR, Estimated: 60 mL/min — ABNORMAL LOW (ref >60)
Glucose, Bld: 126 mg/dL — ABNORMAL HIGH (ref 70–99)
Potassium: 4.1 mmol/L (ref 3.5–5.1)
Sodium: 137 mmol/L (ref 135–145)

## 2022-06-26 LAB — TROPONIN I (HIGH SENSITIVITY)
Troponin I (High Sensitivity): 10 ng/L (ref <18)
Troponin I (High Sensitivity): 9 ng/L (ref <18)

## 2022-06-26 LAB — CBC
HCT: 32.9 % — ABNORMAL LOW (ref 39.0–52.0)
Hemoglobin: 9.5 g/dL — ABNORMAL LOW (ref 13.0–17.0)
MCH: 20.1 pg — ABNORMAL LOW (ref 26.0–34.0)
MCHC: 28.9 g/dL — ABNORMAL LOW (ref 30.0–36.0)
MCV: 69.6 fL — ABNORMAL LOW (ref 80.0–100.0)
Platelets: 220 10*3/uL (ref 150–400)
RBC: 4.73 MIL/uL (ref 4.22–5.81)
RDW: 18.3 % — ABNORMAL HIGH (ref 11.5–15.5)
WBC: 4.3 10*3/uL (ref 4.0–10.5)
nRBC: 0 % (ref 0.0–0.2)

## 2022-06-26 MED ORDER — FENTANYL CITRATE PF 50 MCG/ML IJ SOSY
25.0000 ug | PREFILLED_SYRINGE | Freq: Once | INTRAMUSCULAR | Status: AC
  Administered 2022-06-26: 25 ug via INTRAVENOUS
  Filled 2022-06-26: qty 1

## 2022-06-26 NOTE — ED Notes (Signed)
Got patient into a gown on the monitor patient is resting with call bell in reach 

## 2022-06-26 NOTE — ED Triage Notes (Signed)
Pt reports left sided chest pain that started about 30 mins ago. Pt took 1 tramadol and 2 Nitros PTA without relief of pain. Denies any associated symptoms. Pt has pacemaker.

## 2022-06-26 NOTE — Discharge Instructions (Addendum)
As discussed, your evaluation today has been largely reassuring.  But, it is important that you monitor your condition carefully, and do not hesitate to return to the ED if you develop new, or concerning changes in your condition. ? ?Otherwise, please follow-up with your physician for appropriate ongoing care. ? ?

## 2022-06-26 NOTE — ED Provider Notes (Signed)
[facility] Provider Note   CSN: [id number] Arrival date & time: 06/26/22  1340     History  Chief Complaint  Patient presents with   Chest Pain    Joel [name] is a 82 y.o. male.  HPI Patient presents with left-sided chest pain.  He has history of multiple medical problems including COPD, CAD, has a pacemaker in place.  Today he was in his usual state of health when he suddenly developed sharp pain in the left upper chest wall.  No dyspnea, no syncope, no fever, no chills.  No clear leaving or exacerbating factors and pain is currently 8/10.     Home Medications Prior to Admission medications   Medication Sig Start Date End Date Taking? Authorizing Provider  abiraterone acetate (ZYTIGA) 250 MG tablet Take 250 mg by mouth daily. 02/17/18   [provider]  Accu-Chek FastClix Lancets MISC Test once daily 11/23/18   Henson, Vickie L, NP-C  albuterol (VENTOLIN HFA) 108 (90 Base) MCG/ACT inhaler INHALE 2 PUFFS INTO THE LUNGS EVERY 6 HOURS AS NEEDED FOR WHEEZING OR SHORTNESS OF BREATH Patient not taking: Reported on 06/09/2022 03/15/19   [name] L, NP-C  amLODipine (NORVASC) 10 MG tablet TAKE 1 TABLET(10 MG) BY MOUTH DAILY 03/22/22   Burnell Blanks, MD  aspirin EC 81 MG tablet Take 1 tablet (81 mg total) by mouth daily. Swallow whole. 03/17/22   Burnell Blanks, MD  brimonidine (ALPHAGAN) 0.2 % ophthalmic solution Place 1 drop into both eyes 2 (two) times daily. 05/09/20   [provider]  carboxymethylcellul-glycerin (REFRESH RELIEVA) 0.5-0.9 % ophthalmic solution Place 1 drop into both eyes daily as needed for dry eyes.    [provider]  diclofenac Sodium (VOLTAREN) 1 % GEL Apply 2 g topically 2 (two) times daily as needed (back pain). 06/16/20   [provider]  gabapentin (NEURONTIN) 600 MG tablet Take 600 mg by mouth 2 (two) times daily.    [provider]  isosorbide  mononitrate (IMDUR) 120 MG 24 hr tablet Take 1 tablet (120 mg total) by mouth daily. 02/19/22   Burnell Blanks, MD  latanoprost (XALATAN) 0.005 % ophthalmic solution Place 1 drop into both eyes at bedtime. 02/28/20   [provider]  nitroGLYCERIN (NITROSTAT) 0.4 MG SL tablet Place 1 tablet (0.4 mg total) under the tongue every 5 (five) minutes as needed for chest pain (max 3 doses). Patient not taking: Reported on 06/09/2022 02/19/22   Burnell Blanks, MD  omeprazole (PRILOSEC) 20 MG capsule Take 20 mg by mouth daily.    [provider]  polyethylene glycol powder (GLYCOLAX/MIRALAX) 17 GM/SCOOP powder Take 17 g by mouth daily. 01/25/22   Tysinger, Camelia Eng, PA-C  pravastatin (PRAVACHOL) 40 MG tablet TAKE 1 TABLET(40 MG) BY MOUTH EVERY EVENING 02/19/22   Burnell Blanks, MD  predniSONE (DELTASONE) 5 MG tablet Take 5 mg by mouth daily. 07/17/20   [provider]  tamsulosin (FLOMAX) 0.4 MG CAPS capsule Take 0.4 mg by mouth daily. 02/06/20   [provider]  traMADol (ULTRAM) 50 MG tablet TAKE 1 TABLET(50 MG) BY MOUTH EVERY 12 HOURS AS NEEDED 06/25/22   Tysinger, Camelia Eng, PA-C      Allergies    Topiramate, Atenolol, Lisinopril, and Tramadol-acetaminophen    Review of Systems   Review of Systems  All other systems reviewed and are negative.   Physical Exam Updated Vital Signs BP 128/66  Pulse 60   Temp 97.9 F (36.6 C) (Oral)   Resp 12   SpO2 97%  Physical Exam Vitals and nursing note reviewed.  Constitutional:      General: He is not in acute distress.    Appearance: He is well-developed.  HENT:     Head: Normocephalic and atraumatic.  Eyes:     Conjunctiva/sclera: Conjunctivae normal.  Cardiovascular:     Rate and Rhythm: Normal rate and regular rhythm.  Pulmonary:     Effort: Pulmonary effort is normal. No respiratory distress.     Breath sounds: No stridor.  Abdominal:     General: There is no distension.  Skin:     General: Skin is warm and dry.  Neurological:     Mental Status: He is alert and oriented to person, place, and time.     ED Results / Procedures / Treatments   Labs (all labs ordered are listed, but only abnormal results are displayed) Labs Reviewed  BASIC METABOLIC PANEL - Abnormal; Notable for the following components:      Result Value   Glucose, Bld 126 (*)    GFR, Estimated 60 (*)    All other components within normal limits  CBC - Abnormal; Notable for the following components:   Hemoglobin 9.5 (*)    HCT 32.9 (*)    MCV 69.6 (*)    MCH 20.1 (*)    MCHC 28.9 (*)    RDW 18.3 (*)    All other components within normal limits  TROPONIN I (HIGH SENSITIVITY)  TROPONIN I (HIGH SENSITIVITY)    EKG EKG Interpretation  Date/Time:  Saturday June 26 2022 13:36:08 EDT Ventricular Rate:  64 PR Interval:  196 QRS Duration: 126 QT Interval:  456 QTC Calculation: 470 R Axis:   3 Text Interpretation: Atrial-sensed ventricular-paced rhythm Abnormal ECG When compared with ECG of 24-May-2022 22:15, PREVIOUS ECG IS PRESENT Confirmed by Carmin Muskrat [phone]) on 06/26/2022 4:01:36 PM  Radiology DG Chest 2 View  Result Date: 06/26/2022 CLINICAL DATA:  Chest pain EXAM: CHEST - 2 VIEW COMPARISON:  05/24/2022 FINDINGS: Left chest multi lead pacer. The heart size and mediastinal contours are within normal limits. Both lungs are clear. Disc degenerative disease of the thoracic spine. IMPRESSION: No acute abnormality of the lungs. Electronically Signed   By: Delanna Ahmadi M.D.   On: 06/26/2022 15:03    Procedures Procedures    Medications Ordered in ED Medications  fentaNYL (SUBLIMAZE) injection 25 mcg (25 mcg Intravenous Given 06/26/22 1718)    ED Course/ Medical Decision Making/ A&P                             Medical Decision Making Patient with COPD, CAD, elevated cardiac risk profile presents with chest pain.  He is awake, alert, afebrile, hemodynamically unremarkable and  initial findings are reassuring for low suspicion of ACS though this is on the differential, as is pneumonia, musculoskeletal etiology, COPD exacerbation, less likely PE.  Cardiac 65 paced abnormal Pulse ox 100% room air normal   Amount and/or Complexity of Data Reviewed External Data Reviewed: notes.    Details: Ongoing monitoring of pacemaker Labs: ordered. Decision-making details documented in ED Course. Radiology: ordered and independent interpretation performed. Decision-making details documented in ED Course. ECG/medicine tests: ordered and independent interpretation performed. Decision-making details documented in ED Course.  Risk Prescription drug management.   6:50 PM Patient awake, alert, in no distress, hemodynamically  unremarkable, has had no notable changes throughout hours of monitoring in the ED, has 2 normal troponin, nonischemic EKG.  Though he does have some elevated risk profile with his history, there is no evidence for ongoing coronary ischemia, no evidence for PE, pneumonia, pneumothorax or other acute phenomena.  Patient has a cardiologist, will have outpatient referral for follow-up in the coming days.        Final Clinical Impression(s) / ED Diagnoses Final diagnoses:  Atypical chest pain    Rx / DC Orders ED Discharge Orders          Ordered    Ambulatory referral to Cardiology       Comments: If you have not heard from the Cardiology office within the next 72 hours please call [phone].   06/26/22 1849              Carmin Muskrat, MD 06/26/22 1850

## 2022-06-28 ENCOUNTER — Ambulatory Visit: Payer: Medicare HMO | Attending: Cardiovascular Disease | Admitting: Cardiovascular Disease

## 2022-06-28 ENCOUNTER — Encounter: Payer: Self-pay | Admitting: Cardiovascular Disease

## 2022-06-28 VITALS — BP 130/60 | HR 64 | Ht 69.0 in | Wt 239.0 lb

## 2022-06-28 DIAGNOSIS — I10 Essential (primary) hypertension: Secondary | ICD-10-CM

## 2022-06-28 DIAGNOSIS — I442 Atrioventricular block, complete: Secondary | ICD-10-CM

## 2022-06-28 DIAGNOSIS — I480 Paroxysmal atrial fibrillation: Secondary | ICD-10-CM | POA: Diagnosis not present

## 2022-06-28 DIAGNOSIS — E782 Mixed hyperlipidemia: Secondary | ICD-10-CM | POA: Diagnosis not present

## 2022-06-28 DIAGNOSIS — Z950 Presence of cardiac pacemaker: Secondary | ICD-10-CM

## 2022-06-28 DIAGNOSIS — I7121 Aneurysm of the ascending aorta, without rupture: Secondary | ICD-10-CM

## 2022-06-28 DIAGNOSIS — I25118 Atherosclerotic heart disease of native coronary artery with other forms of angina pectoris: Secondary | ICD-10-CM

## 2022-06-28 NOTE — Patient Instructions (Signed)
Medication Instructions:  No changes *If you need a refill on your cardiac medications before your next appointment, please call your pharmacy*   Lab Work: none If you have labs (blood work) drawn today and your tests are completely normal, you will receive your results only by: MyChart Message (if you have MyChart) OR A paper copy in the mail If you have any lab test that is abnormal or we need to change your treatment, we will call you to review the results.   Testing/Procedures: none   Follow-Up: At [facility], you and your health needs are our priority.  As part of our continuing mission to provide you with exceptional heart care, we have created designated Provider Care Teams.  These Care Teams include your primary Cardiologist (physician) and Advanced Practice Providers (APPs -  Physician Assistants and Nurse Practitioners) who all work together to provide you with the care you need, when you need it.  We recommend signing up for the patient portal called "MyChart".  Sign up information is provided on this After Visit Summary.  MyChart is used to connect with patients for Virtual Visits (Telemedicine).  Patients are able to view lab/test results, encounter notes, upcoming appointments, etc.  Non-urgent messages can be sent to your provider as well.   To learn more about what you can do with MyChart, go to [email or web address].    Your next appointment:   12 month(s)  Provider:   Christopher McAlhany, MD      

## 2022-06-28 NOTE — Progress Notes (Signed)
Chief Complaint  Patient presents with   Follow-up    CAD     History of Present Illness: 82 yo male with history of HTN, HLD, CAD, PAD, DM, MALT of orbit, heart block s/p pacemaker placement, paroxysmal atrial fibrillation, thoracic aortic aneurysm, aortic valve insufficiency and NSVT who is here today for cardiac follow up. Cardiac cath February 2014 severe OM2 stenosis treated with a bare metal stent. Since then he has had chronic angina and has been seen in the office and ED many times. Nuclear stress test November 2018 with LVEF 55% with moderate inferior defect that partially improves consistent with ischemia and possible soft tissue attenuation.  He was seen in the ED April 2019, October 2019, April 2020, May 2020 and June 2020 with atypical chest pain. Cardiac cath April 2021 with moderate diffuse CAD but no focally obstructive lesions except in the PDA which is too small for stenting. He was seen in the ED January 2022 and May 2022 with chest pain. Troponin negative.  Nuclear stress test in January 2023 with no ischemia. He had complete heart block in April 2023 and had a pacemaker placed in May 2023. His device showed episodes of atrial fib. He was started on anti-coagulation but developed GI bleeding. His bleeding resolved when the anti-coagulation was stopped. ASA was also stopped. He has been seen by Dr. Quentin Ore and Watchman device is being planned for May 2024. Cardiac CT in February 2024 arranged in planning for Watchman with 4.7 cm ascending aortic aneurysm. Echo January 2024 with LVEF=60-65%. Mild AI.   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 His PAD is followed in [facility] by Dr. Scot Dock.  He is here today for follow up. He tells me that his chest pain this weekend was a "stinging" pain. No dyspnea or diaphoresis. He tried Tramadol and NTG with no relief. Troponin negative. EKG unchanged. His pain resolved in the ED. The patient denies any dyspnea, palpitations, lower  extremity edema, orthopnea, PND, dizziness, near syncope or syncope.   Primary Care Physician:  Caryl Ada  Past Medical History:  Diagnosis Date   Atypical chest pain    CAD (coronary artery disease)    a. Multiple prior evaluations then 05/2012 s/p BMS to OM2. b. Nuc 11/2012 wnl. c. Cath 2015: patent stent, moderate LAD/RCA dz, treated medically.   Cataract    CHEST PAIN UNSPECIFIED 09/13/2008   Qualifier: Diagnosis of  By: Rose Fillers, RN, Heather     CKD (chronic kidney disease), stage II    COPD (chronic obstructive pulmonary disease) (HCC)    Cough secondary to angiotensin converting enzyme inhibitor (ACE-I)    Diabetes mellitus ([place]) 09/07/2019   Diabetes mellitus without complication (HCC)    Diverticulosis    DVT (deep venous thrombosis) (HCC)    Family history of breast cancer in first degree relative    GERD (gastroesophageal reflux disease)    Headache(784.0)    Hx: of   History of blood transfusion    "I've had 2; don't know what it was related to" (11/09/2012)   HLD (hyperlipidemia)    HTN (hypertension)    Iron deficiency anemia    Lymphoproliferative disorder, low grade B cell ([place]) 05/21/2020   Microcytic anemia    Peripheral vascular disease ([place])    a. L pop-tibial bypass 2011, followed by [facility].   Personal history of prostate cancer    Rotator cuff injury    "left arm; never repaired" (11/09/2012)  Past Surgical History:  Procedure Laterality Date   CARDIOVASCULAR STRESS TEST  Aug. 2014   CATARACT EXTRACTION W/ INTRAOCULAR LENS IMPLANT Right ~ 2008   CIRCUMCISION     COLONOSCOPY     CORONARY ANGIOPLASTY WITH STENT PLACEMENT  2014   "1" (11/09/2012)   ILIAC ARTERY ANEURYSM REPAIR     LEFT HEART CATH  05-31-12   LEFT HEART CATH AND CORONARY ANGIOGRAPHY N/A 08/01/2019   Procedure: LEFT HEART CATH AND CORONARY ANGIOGRAPHY;  Surgeon: Burnell Blanks, MD;  Location: [place] CV LAB;  Service: Cardiovascular;  Laterality: N/A;   LEFT HEART  CATHETERIZATION WITH CORONARY ANGIOGRAM N/A 06/01/2011   Procedure: LEFT HEART CATHETERIZATION WITH CORONARY ANGIOGRAM;  Surgeon: Hillary Bow, MD;  Location: [facility] CATH LAB;  Service: Cardiovascular;  Laterality: N/A;   LEFT HEART CATHETERIZATION WITH CORONARY ANGIOGRAM N/A 05/31/2012   Procedure: LEFT HEART CATHETERIZATION WITH CORONARY ANGIOGRAM;  Surgeon: Peter M Martinique, MD;  Location: [facility] CATH LAB;  Service: Cardiovascular;  Laterality: N/A;   LEFT HEART CATHETERIZATION WITH CORONARY ANGIOGRAM N/A 08/01/2013   Procedure: LEFT HEART CATHETERIZATION WITH CORONARY ANGIOGRAM;  Surgeon: Burnell Blanks, MD;  Location: [facility] CATH LAB;  Service: Cardiovascular;  Laterality: N/A;   PACEMAKER IMPLANT N/A 09/03/2021   Procedure: PACEMAKER IMPLANT;  Surgeon: Evans Lance, MD;  Location: [place] CV LAB;  Service: Cardiovascular;  Laterality: N/A;   PERCUTANEOUS CORONARY STENT INTERVENTION (PCI-S)  05/31/2012   Procedure: PERCUTANEOUS CORONARY STENT INTERVENTION (PCI-S);  Surgeon: Peter M Martinique, MD;  Location: [facility] CATH LAB;  Service: Cardiovascular;;   PR VEIN BYPASS GRAFT,AORTO-FEM-POP Left 10/01/2009   left below knee popliteal artery to posterior tibial artery   SHOULDER ARTHROSCOPY WITH OPEN ROTATOR CUFF REPAIR AND DISTAL CLAVICLE ACROMINECTOMY Left 01/12/2013   Procedure: LEFT SHOULDER ARTHROSCOPY WITH DEBRIDEMENT OPEN DISTAL CLAVICLE RESECTION ,acromioplastyAND ROTATOR CUFF REPAIR;  Surgeon: Yvette Rack., MD;  Location: [place];  Service: Orthopedics;  Laterality: Left;    Current Outpatient Medications  Medication Sig Dispense Refill   abiraterone acetate (ZYTIGA) 250 MG tablet Take 250 mg by mouth daily.     Accu-Chek FastClix Lancets MISC Test once daily 102 each 12   albuterol (VENTOLIN HFA) 108 (90 Base) MCG/ACT inhaler INHALE 2 PUFFS INTO THE LUNGS EVERY 6 HOURS AS NEEDED FOR WHEEZING OR SHORTNESS OF BREATH 54 g 0   amLODipine (NORVASC) 10 MG tablet TAKE 1 TABLET(10 MG) BY MOUTH DAILY  90 tablet 3   aspirin EC 81 MG tablet Take 1 tablet (81 mg total) by mouth daily. Swallow whole. 90 tablet 3   brimonidine (ALPHAGAN) 0.2 % ophthalmic solution Place 1 drop into both eyes 2 (two) times daily.     carboxymethylcellul-glycerin (REFRESH RELIEVA) 0.5-0.9 % ophthalmic solution Place 1 drop into both eyes daily as needed for dry eyes.     diclofenac Sodium (VOLTAREN) 1 % GEL Apply 2 g topically 2 (two) times daily as needed (back pain).     gabapentin (NEURONTIN) 600 MG tablet Take 600 mg by mouth 2 (two) times daily.     isosorbide mononitrate (IMDUR) 120 MG 24 hr tablet Take 1 tablet (120 mg total) by mouth daily. 90 tablet 2   latanoprost (XALATAN) 0.005 % ophthalmic solution Place 1 drop into both eyes at bedtime.     nitroGLYCERIN (NITROSTAT) 0.4 MG SL tablet Place 1 tablet (0.4 mg total) under the tongue every 5 (five) minutes as needed for chest pain (max 3 doses). 75 tablet 2  omeprazole (PRILOSEC) 20 MG capsule Take 20 mg by mouth daily.     polyethylene glycol powder (GLYCOLAX/MIRALAX) 17 GM/SCOOP powder Take 17 g by mouth daily. 289 g 0   pravastatin (PRAVACHOL) 40 MG tablet TAKE 1 TABLET(40 MG) BY MOUTH EVERY EVENING 90 tablet 2   predniSONE (DELTASONE) 5 MG tablet Take 5 mg by mouth daily.     tamsulosin (FLOMAX) 0.4 MG CAPS capsule Take 0.4 mg by mouth daily.     traMADol (ULTRAM) 50 MG tablet TAKE 1 TABLET(50 MG) BY MOUTH EVERY 12 HOURS AS NEEDED 30 tablet 0   No current facility-administered medications for this visit.    Allergies  Allergen Reactions   Topiramate Shortness Of Breath and Other (See Comments)     leg cramps   Atenolol Other (See Comments)    bradycardia   Lisinopril Cough   Tramadol-Acetaminophen Cough    Tolerates plain Tramadol    Social History   Socioeconomic History   Marital status: Widowed    Spouse name: Not on file   Number of children: Not on file   Years of education: Not on file   Highest education level: 12th grade   Occupational History   Occupation: Retired  Tobacco Use   Smoking status: Former    Packs/day: 0.50    Years: 52.00    Additional pack years: 0.00    Total pack years: 26.00    Types: Cigarettes    Quit date: 05/26/2011    Years since quitting: 11.0    Passive exposure: Past   Smokeless tobacco: Never  Vaping Use   Vaping Use: Never used  Substance and Sexual Activity   Alcohol use: No   Drug use: No   Sexual activity: Not on file  Other Topics Concern   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  Lives in [place] by himself.      Patient writes with his right hand, though uses his left hand for every thing else. He lives alone in a one level home. He drinks 3 cups of coffee a day and 3 7-8 oz sodas a day. He does not exercise.   Social Determinants of Health   Financial Resource Strain: Low Risk  (06/01/2022)   Overall Financial Resource Strain (CARDIA)    Difficulty of Paying Living Expenses: Not hard at all  Food Insecurity: No Food Insecurity (06/01/2022)   Hunger Vital Sign    Worried About Running Out of Food in the Last Year: Never true    Ran Out of Food in the Last Year: Never true  Transportation Needs: No Transportation Needs (06/01/2022)   PRAPARE - Hydrologist (Medical): No    Lack of Transportation (Non-Medical): No  Physical Activity: Inactive (06/01/2022)   Exercise Vital Sign    Days of Exercise per Week: 0 days    Minutes of Exercise per Session: 0 min  Stress: No Stress Concern Present (06/01/2022)   [place]    Feeling of Stress : Not at all  Social Connections: Not on file  Intimate Partner Violence: Not on file    Family History  Problem Relation Age of Onset   Breast cancer Mother        dx 27s   Hyperlipidemia Mother    Hypertension Mother    COPD Father        deceased   Hyperlipidemia  Father     Hypertension Father    Hypertension Sister    Cancer Brother 18       Unknown cancer, possibly stomach   Hypertension Brother    Cancer Brother 80       Unknown, possibly lung   Hypertension Daughter    Hypertension Son    Colon cancer Neg Hx        pt is unclear about family hx    Review of Systems:  As stated in the HPI and otherwise negative.   BP 130/60   Pulse 64   Ht 5\' 9"  (1.753 m)   Wt 108.4 kg   SpO2 98%   BMI 35.29 kg/m   Physical Examination:  General: Well developed, well nourished, NAD  HEENT: OP clear, mucus membranes moist  SKIN: warm, dry. No rashes. Neuro: No focal deficits  Musculoskeletal: Muscle strength 5/5 all ext  Psychiatric: Mood and affect normal  Neck: No JVD, no carotid bruits, no thyromegaly, no lymphadenopathy.  Lungs:Clear bilaterally, no wheezes, rhonci, crackles Cardiovascular: Regular rate and rhythm. No murmurs, gallops or rubs. Abdomen:Soft. Bowel sounds present. Non-tender.  Extremities: No lower extremity edema. Pulses are 2 + in the bilateral DP/PT.  Echo January 2024: 1. Left ventricular ejection fraction, by estimation, is 60 to 65%. The  left ventricle has normal function. The left ventricle has no regional  wall motion abnormalities. Left ventricular diastolic parameters are  consistent with Grade I diastolic  dysfunction (impaired relaxation). The average left ventricular global  longitudinal strain is -21.2 %. The global longitudinal strain is normal.   2. Right ventricular systolic function is normal. The right ventricular  size is normal.   3. The mitral valve is normal in structure. No evidence of mitral valve  regurgitation. No evidence of mitral stenosis.   4. The aortic valve is tricuspid. Aortic valve regurgitation is mild.  Aortic valve sclerosis/calcification is present, without any evidence of  aortic stenosis.   5. Aortic dilatation noted. There is mild dilatation of the aortic root,  measuring 39 mm. There is  mild dilatation of the ascending aorta,  measuring 40 mm.   Cardiac cath 08/01/2019: Prox RCA lesion is 40% stenosed. Mid RCA lesion is 40% stenosed. Mid RCA to Dist RCA lesion is 50% stenosed. RPDA lesion is 70% stenosed. 3rd RPL lesion is 70% stenosed. Prox Cx to Dist Cx lesion is 40% stenosed. Prox LAD to Mid LAD lesion is 40% stenosed. Mid LAD lesion is 40% stenosed. Dist LAD lesion is 70% stenosed. The left ventricular systolic function is normal. LV end diastolic pressure is normal. The left ventricular ejection fraction is 55-65% by visual estimate. There is no mitral valve regurgitation.   1. Moderate non-obstructive disease in the LAD and Circumflex 2. Moderate non-obstructive disease in the RCA. Severe disease in the small caliber PDA, too small for PCI 3. Normal LV systolic function  EKG:  EKG is not ordered today. The ekg ordered today demonstrates   Recent Labs: 05/24/2022: ALT 13 06/26/2022: BUN 10; Creatinine, Ser 1.21; Hemoglobin 9.5; Platelets 220; Potassium 4.1; Sodium 137    Wt Readings from Last 3 Encounters:  06/28/22 108.4 kg  06/09/22 109.4 kg  06/01/22 108 kg    Assessment and Plan:   1. CAD with stable angina: Most recent cath April 2021 with stable CAD, severe stenosis in small PDA that is too small for stenting. Otherwise mild to moderate disease in all three vessels. Nuclear stress test in January 2023 with  no ischemia. He was seen in the ED this weekend with chest pain. His pain sounded non-cardiac. No recurrence of pain.  -Continue ASA, statin, Norvasc and Imdur.     2. HTN: BP is controlled. No changes   3. Hyperlipidemia: Lipids followed in primary care. LDL 68 in November 2023. Continue statin.    4. Complete heart block: Pacemaker in place. Followed in EP clinic by Dr. Lovena Le  5. Paroxysmal atrial fibrillation: sinus today on exam.  He has not tolerated anti-coagulation secondary to GI bleeding. GI felt that his bleeding was due to  hemorrhoids. I do not think he will tolerate long term anti-coagulation. Planning in place for Watchman device in May 2024.   6. Thoracic aortic aneurysm: 4.7 cm aneurysm by chest CTA in February 2024. Will need repeat scan in February 2025.     Labs/ tests ordered today include:  No orders of the defined types were placed in this encounter.  Disposition:   F/U with me in one year.   Signed, Lauree Chandler, MD 06/28/2022 4:12 PM    [facility] [address], [place], [place]  [address] Phone: [phone]; Fax: [phone]

## 2022-06-30 ENCOUNTER — Telehealth: Payer: Self-pay

## 2022-06-30 DIAGNOSIS — M791 Myalgia, unspecified site: Secondary | ICD-10-CM | POA: Diagnosis not present

## 2022-06-30 DIAGNOSIS — M5116 Intervertebral disc disorders with radiculopathy, lumbar region: Secondary | ICD-10-CM | POA: Diagnosis not present

## 2022-06-30 DIAGNOSIS — M5416 Radiculopathy, lumbar region: Secondary | ICD-10-CM | POA: Diagnosis not present

## 2022-06-30 NOTE — Telephone Encounter (Signed)
     Patient  visit on 3/16  at [facility]    Have you been able to follow up with your primary care physician? Yes   The patient was or was not able to obtain any needed medicine or equipment. Yes   Are there diet recommendations that you are having difficulty following? Na   Patient expresses understanding of discharge instructions and education provided has no other needs at this time. Yes      [place] [phone] [address], [place],  [address] Phone: [phone] Email: [email or web address]

## 2022-07-01 ENCOUNTER — Telehealth: Payer: Self-pay

## 2022-07-01 NOTE — Patient Outreach (Signed)
  Care Coordination   07/01/2022 Name: Joel [name] MRN: [id number] DOB: 06/26/1940   Care Coordination Outreach Attempts:  An unsuccessful telephone outreach was attempted today to offer the patient information about available care coordination services as a benefit of their health plan.   Follow Up Plan:  Additional outreach attempts will be made to offer the patient care coordination information and services.   Encounter Outcome:  No Answer   Care Coordination Interventions:  No, not indicated    Daneen Schick, BSW, CDP Social Worker, Certified Dementia Practitioner [facility]  Care Coordination [phone]

## 2022-07-08 DIAGNOSIS — I25118 Atherosclerotic heart disease of native coronary artery with other forms of angina pectoris: Secondary | ICD-10-CM | POA: Diagnosis not present

## 2022-07-08 DIAGNOSIS — K5900 Constipation, unspecified: Secondary | ICD-10-CM | POA: Diagnosis not present

## 2022-07-08 DIAGNOSIS — I4891 Unspecified atrial fibrillation: Secondary | ICD-10-CM | POA: Diagnosis not present

## 2022-07-08 DIAGNOSIS — Z0000 Encounter for general adult medical examination without abnormal findings: Secondary | ICD-10-CM | POA: Diagnosis not present

## 2022-07-08 DIAGNOSIS — C61 Malignant neoplasm of prostate: Secondary | ICD-10-CM | POA: Diagnosis not present

## 2022-07-08 DIAGNOSIS — E785 Hyperlipidemia, unspecified: Secondary | ICD-10-CM | POA: Diagnosis not present

## 2022-07-08 DIAGNOSIS — E669 Obesity, unspecified: Secondary | ICD-10-CM | POA: Diagnosis not present

## 2022-07-08 DIAGNOSIS — I10 Essential (primary) hypertension: Secondary | ICD-10-CM | POA: Diagnosis not present

## 2022-07-08 DIAGNOSIS — F17211 Nicotine dependence, cigarettes, in remission: Secondary | ICD-10-CM | POA: Diagnosis not present

## 2022-07-09 NOTE — Progress Notes (Signed)
Remote pacemaker transmission.   

## 2022-07-12 ENCOUNTER — Telehealth: Payer: Self-pay

## 2022-07-12 NOTE — Patient Outreach (Signed)
  Care Coordination   07/12/2022 Name: Joel [name] MRN: [id number] DOB: [birth year]   Care Coordination Outreach Attempts:  A second unsuccessful outreach was attempted today to offer the patient with information about available care coordination services as a benefit of their health plan.     Follow Up Plan:  Additional outreach attempts will be made to offer the patient care coordination information and services.   Encounter Outcome:  No Answer   Care Coordination Interventions:  No, not indicated    Daneen Schick, BSW, CDP Social Worker, Certified Dementia Practitioner [facility]  Care Coordination [phone]

## 2022-07-16 ENCOUNTER — Ambulatory Visit: Payer: Self-pay

## 2022-07-16 NOTE — Patient Outreach (Signed)
  Care Coordination   Initial Visit Note   07/16/2022 Name: Joel [name] MRN: [id number] DOB: [birth year]  Joel [name] is a 82 y.o. year old male who sees Tysinger, Kermit Balo, PA-C for primary care. I spoke with  Lilia Pro by phone today.  What matters to the patients health and wellness today?  No concerns reported during todays call    Goals Addressed             This Visit's Progress    COMPLETED: Care Coordination Activities       Care Coordination Interventions: Introduced role of the care coordination team to the patient - no follow up desired at this time Discussed patient remains active with the [place] as well as [facility] Instructed the patient to call his providers office as needed         SDOH assessments and interventions completed:  Yes  SDOH Interventions Today    Flowsheet Row Most Recent Value  SDOH Interventions   Food Insecurity Interventions Intervention Not Indicated  Housing Interventions Intervention Not Indicated  Transportation Interventions Intervention Not Indicated  Utilities Interventions Intervention Not Indicated        Care Coordination Interventions:  Yes, provided   Interventions Today    Flowsheet Row Most Recent Value  Chronic Disease   Chronic disease during today's visit Hypertension (HTN), Chronic Obstructive Pulmonary Disease (COPD), Diabetes  General Interventions   General Interventions Discussed/Reviewed General Interventions Discussed  Education Interventions   Education Provided Provided Education  Provided Verbal Education On Other  [Care Coordination Program]        Follow up plan: No further intervention required.   Encounter Outcome:  Pt. Visit Completed   Bevelyn Ngo, BSW, CDP Social Worker, Certified Dementia Practitioner [facility] Management  Care Coordination [phone]

## 2022-07-16 NOTE — Patient Instructions (Signed)
Visit Information  Thank you for taking time to visit with me today. Please don't hesitate to contact me if I can be of assistance to you.   Following are the goals we discussed today:   Goals Addressed             This Visit's Progress    COMPLETED: Care Coordination Activities       Care Coordination Interventions: Introduced role of the care coordination team to the patient - no follow up desired at this time Discussed patient remains active with the [place] as well as [facility] Instructed the patient to call his providers office as needed         If you are experiencing a Mental Health or Behavioral Health Crisis or need someone to talk to, please call 911  The patient verbalized understanding of instructions, educational materials, and care plan provided today and DECLINED offer to receive copy of patient instructions, educational materials, and care plan.   No further follow up required: Please contact your primary care providers office as needed.  Bevelyn Ngo, BSW, CDP Social Worker, Certified Dementia Practitioner [facility] Care Management  Care Coordination [phone]

## 2022-07-29 DIAGNOSIS — Z0000 Encounter for general adult medical examination without abnormal findings: Secondary | ICD-10-CM | POA: Diagnosis not present

## 2022-07-29 DIAGNOSIS — Z136 Encounter for screening for cardiovascular disorders: Secondary | ICD-10-CM | POA: Diagnosis not present

## 2022-07-29 DIAGNOSIS — Z79899 Other long term (current) drug therapy: Secondary | ICD-10-CM | POA: Diagnosis not present

## 2022-07-29 DIAGNOSIS — C8590 Non-Hodgkin lymphoma, unspecified, unspecified site: Secondary | ICD-10-CM | POA: Diagnosis not present

## 2022-07-29 DIAGNOSIS — Z125 Encounter for screening for malignant neoplasm of prostate: Secondary | ICD-10-CM | POA: Diagnosis not present

## 2022-07-29 DIAGNOSIS — R7303 Prediabetes: Secondary | ICD-10-CM | POA: Diagnosis not present

## 2022-07-29 DIAGNOSIS — Z0001 Encounter for general adult medical examination with abnormal findings: Secondary | ICD-10-CM | POA: Diagnosis not present

## 2022-07-29 DIAGNOSIS — C61 Malignant neoplasm of prostate: Secondary | ICD-10-CM | POA: Diagnosis not present

## 2022-07-29 DIAGNOSIS — D6869 Other thrombophilia: Secondary | ICD-10-CM | POA: Diagnosis not present

## 2022-07-30 ENCOUNTER — Other Ambulatory Visit: Payer: Self-pay | Admitting: Student

## 2022-07-30 DIAGNOSIS — R0989 Other specified symptoms and signs involving the circulatory and respiratory systems: Secondary | ICD-10-CM

## 2022-08-06 ENCOUNTER — Other Ambulatory Visit: Payer: Self-pay | Admitting: Rehabilitation

## 2022-08-06 DIAGNOSIS — M5416 Radiculopathy, lumbar region: Secondary | ICD-10-CM

## 2022-08-06 DIAGNOSIS — M5116 Intervertebral disc disorders with radiculopathy, lumbar region: Secondary | ICD-10-CM

## 2022-08-06 DIAGNOSIS — M4326 Fusion of spine, lumbar region: Secondary | ICD-10-CM

## 2022-08-13 ENCOUNTER — Ambulatory Visit: Payer: Medicare HMO | Attending: Cardiology | Admitting: Cardiology

## 2022-08-13 VITALS — BP 150/82 | HR 67 | Ht 69.0 in | Wt 236.6 lb

## 2022-08-13 DIAGNOSIS — I480 Paroxysmal atrial fibrillation: Secondary | ICD-10-CM

## 2022-08-13 DIAGNOSIS — Z01818 Encounter for other preprocedural examination: Secondary | ICD-10-CM | POA: Diagnosis not present

## 2022-08-13 DIAGNOSIS — K921 Melena: Secondary | ICD-10-CM

## 2022-08-13 DIAGNOSIS — I10 Essential (primary) hypertension: Secondary | ICD-10-CM

## 2022-08-13 DIAGNOSIS — I442 Atrioventricular block, complete: Secondary | ICD-10-CM

## 2022-08-13 DIAGNOSIS — Z950 Presence of cardiac pacemaker: Secondary | ICD-10-CM

## 2022-08-13 DIAGNOSIS — I2510 Atherosclerotic heart disease of native coronary artery without angina pectoris: Secondary | ICD-10-CM | POA: Diagnosis not present

## 2022-08-13 DIAGNOSIS — I25118 Atherosclerotic heart disease of native coronary artery with other forms of angina pectoris: Secondary | ICD-10-CM | POA: Diagnosis not present

## 2022-08-13 NOTE — Progress Notes (Signed)
[facility]                                     Cardiology Office Note:    Date:  08/13/2022   ID:  Joel [name], DOB Nov 07, 1940, MRN [id number]  PCP:  Filbert Schilder, NP  [facility] Cardiologist:  Verne Carrow, MD  [facility] Electrophysiologist:  Lanier Prude, MD   Referring MD: Jac Canavan, PA-C   Chief Complaint  Patient presents with   Follow-up    Pre LAAO    History of Present Illness:    Joel [name] is a 82 y.o. male with a hx of HTN, HLD, CAD, PAD, DM, MALT of orbit, heart block s/p pacemaker placement, paroxysmal atrial fibrillation, thoracic aortic aneurysm, aortic valve insufficiency and NSVT who was referred to Dr. Lalla Brothers for LAAO closure with Watchman given extensive GI bleeding while on anticoagulation.   Joel [name] was diagnosed with AF by PPM monitoring and he has remained asymptomatic. After he was started on anticoagulation, he began having issues with GI bleeding. He was since tolerated ASA monotherapy and was interested in long term stroke reduction with Watchman. CT imaging shows anatomy suitable for implant.   Today he is here and reports that he has been well with no symptoms of chest pain, palpitations, LE edema, SOB, dizziness, or syncope. He has not had any recent bleeding.   Past Medical History:  Diagnosis Date   Atypical chest pain    CAD (coronary artery disease)    a. Multiple prior evaluations then 05/2012 s/p BMS to OM2. b. Nuc 11/2012 wnl. c. Cath 2015: patent stent, moderate LAD/RCA dz, treated medically.   Cataract    CHEST PAIN UNSPECIFIED 09/13/2008   Qualifier: Diagnosis of  By: Trevor Iha, RN, Heather     CKD (chronic kidney disease), stage II    COPD (chronic obstructive pulmonary disease) (HCC)    Cough secondary to angiotensin converting enzyme inhibitor (ACE-I)    Diabetes mellitus (HCC) 09/07/2019   Diabetes mellitus without complication (HCC)    Diverticulosis    DVT (deep venous  thrombosis) (HCC)    Family history of breast cancer in first degree relative    GERD (gastroesophageal reflux disease)    Headache(784.0)    Hx: of   History of blood transfusion    "I've had 2; don't know what it was related to" (11/09/2012)   HLD (hyperlipidemia)    HTN (hypertension)    Iron deficiency anemia    Lymphoproliferative disorder, low grade B cell (HCC) 05/21/2020   Microcytic anemia    Peripheral vascular disease (HCC)    a. L pop-tibial bypass 2011, followed by [facility].   Personal history of prostate cancer    Rotator cuff injury    "left arm; never repaired" (11/09/2012)    Past Surgical History:  Procedure Laterality Date   CARDIOVASCULAR STRESS TEST  Aug. 2014   CATARACT EXTRACTION W/ INTRAOCULAR LENS IMPLANT Right ~ 2008   CIRCUMCISION     COLONOSCOPY     CORONARY ANGIOPLASTY WITH STENT PLACEMENT  2014   "1" (11/09/2012)   ILIAC ARTERY ANEURYSM REPAIR     LEFT HEART CATH  05-31-12   LEFT HEART CATH AND CORONARY ANGIOGRAPHY N/A 08/01/2019   Procedure: LEFT HEART CATH AND CORONARY ANGIOGRAPHY;  Surgeon: Kathleene Hazel, MD;  [facility]: [facility] IN[facility];  Service: Cardiovascular;  Laterality: N/A;   LEFT HEART CATHETERIZATION WITH CORONARY ANGIOGRAM N/A 06/01/2011   Procedure: LEFT HEART CATHETERIZATION WITH CORONARY ANGIOGRAM;  Surgeon: Herby Abraham, MD;  Location: [facility] CATH LAB;  Service: Cardiovascular;  Laterality: N/A;   LEFT HEART CATHETERIZATION WITH CORONARY ANGIOGRAM N/A 05/31/2012   Procedure: LEFT HEART CATHETERIZATION WITH CORONARY ANGIOGRAM;  Surgeon: Peter M Swaziland, MD;  Location: [facility] CATH LAB;  Service: Cardiovascular;  Laterality: N/A;   LEFT HEART CATHETERIZATION WITH CORONARY ANGIOGRAM N/A 08/01/2013   Procedure: LEFT HEART CATHETERIZATION WITH CORONARY ANGIOGRAM;  Surgeon: Kathleene Hazel, MD;  Location: [facility] CATH LAB;  Service: Cardiovascular;  Laterality: N/A;   PACEMAKER IMPLANT N/A 09/03/2021   Procedure: PACEMAKER IMPLANT;  Surgeon:  Marinus Maw, MD;  Location: [facility];  Service: Cardiovascular;  Laterality: N/A;   PERCUTANEOUS CORONARY STENT INTERVENTION (PCI-S)  05/31/2012   Procedure: PERCUTANEOUS CORONARY STENT INTERVENTION (PCI-S);  Surgeon: Peter M Swaziland, MD;  Location: [facility] CATH LAB;  Service: Cardiovascular;;   PR VEIN BYPASS GRAFT,AORTO-FEM-POP Left 10/01/2009   left below knee popliteal artery to posterior tibial artery   SHOULDER ARTHROSCOPY WITH OPEN ROTATOR CUFF REPAIR AND DISTAL CLAVICLE ACROMINECTOMY Left 01/12/2013   Procedure: LEFT SHOULDER ARTHROSCOPY WITH DEBRIDEMENT OPEN DISTAL CLAVICLE RESECTION ,acromioplastyAND ROTATOR CUFF REPAIR;  Surgeon: Thera Flake., MD;  Location: [facility] OR;  Service: Orthopedics;  Laterality: Left;    Current Medications: No outpatient medications have been marked as taking for the 08/13/22 encounter (Office Visit) with [facility].     Allergies:   Topiramate, Atenolol, Lisinopril, and Tramadol-acetaminophen   Social History   Socioeconomic History   Marital status: Widowed    Spouse name: Not on file   Number of children: Not on file   Years of education: Not on file   Highest education level: 12th grade  Occupational History   Occupation: Retired  Tobacco Use   Smoking status: Former    Packs/day: 0.50    Years: 52.00    Additional pack years: 0.00    Total pack years: 26.00    Types: Cigarettes    Quit date: 05/26/2011    Years since quitting: 11.2    Passive exposure: Past   Smokeless tobacco: Never  Vaping Use   Vaping Use: Never used  Substance and Sexual Activity   Alcohol use: No   Drug use: No   Sexual activity: Not on file  Other Topics Concern   Not on file  Social History Narrative   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  Lives in [place] by himself.      Patient writes with his right hand, though uses his left hand for every thing else. He lives alone in a one level home. He drinks 3 cups of coffee a day  and 3 7-8 oz sodas a day. He does not exercise.   Social Determinants of Health   Financial Resource Strain: Low Risk  (06/01/2022)   Overall Financial Resource Strain (CARDIA)    Difficulty of Paying Living Expenses: Not hard at all  Food Insecurity: No Food Insecurity (07/16/2022)   Hunger Vital Sign    Worried About Running Out of Food in the Last Year: Never true    Ran Out of Food in the Last Year: Never true  Transportation Needs: No Transportation Needs (07/16/2022)   PRAPARE - Administrator, Civil Service (Medical): No    Lack of Transportation (Non-Medical): No  Physical Activity: Inactive (06/01/2022)  Exercise Vital Sign    Days of Exercise per Week: 0 days    Minutes of Exercise per Session: 0 min  Stress: No Stress Concern Present (06/01/2022)   [employer] of Occupational Health - Occupational Stress Questionnaire    Feeling of Stress : Not at all  Social Connections: Not on file     Family History: The patient's family history includes Breast cancer in his mother; COPD in his father; Cancer (age of onset: 44) in his brother; Cancer (age of onset: 67) in his brother; Hyperlipidemia in his father and mother; Hypertension in his brother, daughter, father, mother, sister, and son. There is no history of Colon cancer.  ROS:   Please see the history of present illness.    All other systems reviewed and are negative.  EKGs/Labs/Other Studies Reviewed:    The following studies were reviewed today:  IMPRESSION: 1. The left atrial appendage is a medium size chicken wing type with 2 lobes that is anteriorly directed. There is no thrombus.   2. A 24 mm Watchman FLX device is recommended based on the above landing zone measurements (18.2 mm maximum diameter; 24% compression). Could consider a 20 mm device based on average diameter.   3. An inferior posterior IAS puncture site is recommended.   4. Optimal deployment angle: RAO 14 CAU 0   5. Severe  3-vessel coronary calcifications.  Prior history of PCI.   6. 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   7. Dilated pulmonary artery suggestive of pulmonary hypertension.   EKG:  EKG is ordered today.  The ekg ordered today demonstrates NSR with V pacing  Recent Labs: 05/24/2022: ALT 13 06/26/2022: BUN 10; Creatinine, Ser 1.21; Hemoglobin 9.5; Platelets 220; Potassium 4.1; Sodium 137   Recent Lipid Panel    Component Value Date/Time   CHOL 140 02/17/2022 1532   TRIG 205 (H) 02/17/2022 1532   HDL 40 02/17/2022 1532   CHOLHDL 3.5 02/17/2022 1532   CHOLHDL 5 02/06/2013 0741   VLDL 24.4 02/06/2013 0741   LDLCALC 66 02/17/2022 1532   Risk Assessment/Calculations:    HAS-BLED score 4 Hypertension Yes  Abnormal renal and liver function (Dialysis, transplant, Cr >2.26 mg/dL /Cirrhosis or Bilirubin >2x Normal or AST/ALT/AP >3x Normal) No  Stroke No  Bleeding Yes  Labile INR (Unstable/high INR) No  Elderly (>65) Yes  Drugs or alcohol (? 8 drinks/week, anti-plt or NSAID) Yes    CHA2DS2-VASc Score = 5  The patient's score is based upon: CHF History: 0 HTN History: 1 Diabetes History: 1 Stroke History: 0 Vascular Disease History: 1 Age Score: 2 Gender Score: 0  Physical Exam:    VS:  BP (!) 150/82   Pulse 67   Ht 5\' 9"  (1.753 m)   Wt 236 lb 9.6 oz (107.3 kg)   SpO2 97%   BMI 34.94 kg/m     Wt Readings from Last 3 Encounters:  08/13/22 236 lb 9.6 oz (107.3 kg)  06/28/22 239 lb (108.4 kg)  06/09/22 241 lb 3.2 oz (109.4 kg)    General: Well developed, well nourished, NAD Lungs:Clear to ausculation bilaterally. No wheezes, rales, or rhonchi. Breathing is unlabored. Cardiovascular: RRR with S1 S2. No murmurs Extremities: No edema. Neuro: Alert and oriented. No focal deficits. No facial asymmetry. MAE spontaneously. Psych: Responds to questions appropriately with normal affect.    ASSESSMENT:    PAF: Initially dx by PPM and started on [facility] however has not tolerated this  secondary to recurrent GO  bleeding. He has since be treated with ASA monotherapy for AF and wishes to pursue LAAO closure.  He will continue ASA until his procedure. Plan to load with Plavix 300mg  day of implant and continue DAPT with ASA and Plavix through 6 months. Instruction letter reviewed with understanding. CHG soap given. Obtain CBC, BMET today.   CHB s/p PPM: Followed by EP. No change.   HTN: Elevated today however reports lower readings at home. Follow post LAAO and may add additional agents if needed.   CAD: Follows with Dr. Clifton James. Denies anginal symptoms. Continue current therapies.    Medication Adjustments/Labs and Tests Ordered: Current medicines are reviewed at length with the patient today.  Concerns regarding medicines are outlined above.  Orders Placed This Encounter  Procedures   Basic metabolic panel   CBC   EKG 12-Lead   No orders of the defined types were placed in this encounter.   Patient Instructions  Medication Instructions:  Your physician recommends that you continue on your current medications as directed. Please refer to the Current Medication list given to you today.  *If you need a refill on your cardiac medications before your next appointment, please call your pharmacy*   Lab Work: TODAY: BMET, CBC If you have labs (blood work) drawn today and your tests are completely normal, you will receive your results only by: MyChart Message (if you have MyChart) OR A paper copy in the mail If you have any lab test that is abnormal or we need to change your treatment, we will call you to review the results.   Testing/Procedures: SEE INSTRUCTION LETTER   Follow-Up: At [facility], you and your health needs are our priority.  As part of our continuing mission to provide you with exceptional heart care, we have created designated Provider Care Teams.  These Care Teams include your primary Cardiologist (physician) and Advanced Practice  Providers (APPs -  Physician Assistants and Nurse Practitioners) who all work together to provide you with the care you need, when you need it.  We recommend signing up for the patient portal called "MyChart".  Sign up information is provided on this After Visit Summary.  MyChart is used to connect with patients for Virtual Visits (Telemedicine).  Patients are able to view lab/test results, encounter notes, upcoming appointments, etc.  Non-urgent messages can be sent to your provider as well.   To learn more about what you can do with MyChart, go to [email or web address].    Your next appointment:   POST PROCEDURAL FOLLOW-UP WILL BE [facility]   Signed, Georgie Chard, NP  08/13/2022 1:50 PM    [facility]

## 2022-08-13 NOTE — Patient Instructions (Signed)
Medication Instructions:  Your physician recommends that you continue on your current medications as directed. Please refer to the Current Medication list given to you today.  *If you need a refill on your cardiac medications before your next appointment, please call your pharmacy*   Lab Work: TODAY: BMET, CBC If you have labs (blood work) drawn today and your tests are completely normal, you will receive your results only by: MyChart Message (if you have MyChart) OR A paper copy in the mail If you have any lab test that is abnormal or we need to change your treatment, we will call you to review the results.   Testing/Procedures: SEE INSTRUCTION LETTER   Follow-Up: At [facility], you and your health needs are our priority.  As part of our continuing mission to provide you with exceptional heart care, we have created designated Provider Care Teams.  These Care Teams include your primary Cardiologist (physician) and Advanced Practice Providers (APPs -  Physician Assistants and Nurse Practitioners) who all work together to provide you with the care you need, when you need it.  We recommend signing up for the patient portal called "MyChart".  Sign up information is provided on this After Visit Summary.  MyChart is used to connect with patients for Virtual Visits (Telemedicine).  Patients are able to view lab/test results, encounter notes, upcoming appointments, etc.  Non-urgent messages can be sent to your provider as well.   To learn more about what you can do with MyChart, go to [email or web address].    Your next appointment:   POST PROCEDURAL FOLLOW-UP WILL BE SCHEDULED DURING YOUR HOSPITAL STAY 

## 2022-08-14 LAB — BASIC METABOLIC PANEL
BUN/Creatinine Ratio: 8 — ABNORMAL LOW (ref 10–24)
BUN: 8 mg/dL (ref 8–27)
CO2: 22 mmol/L (ref 20–29)
Calcium: 8.9 mg/dL (ref 8.6–10.2)
Chloride: 104 mmol/L (ref 96–106)
Creatinine, Ser: 0.95 mg/dL (ref 0.76–1.27)
Glucose: 139 mg/dL — ABNORMAL HIGH (ref 70–99)
Potassium: 3.9 mmol/L (ref 3.5–5.2)
Sodium: 141 mmol/L (ref 134–144)
eGFR: 80 mL/min/{1.73_m2} (ref >59)

## 2022-08-14 LAB — CBC
Hematocrit: 33.3 % — ABNORMAL LOW (ref 37.5–51.0)
Hemoglobin: 10 g/dL — ABNORMAL LOW (ref 13.0–17.7)
MCH: 20.5 pg — ABNORMAL LOW (ref 26.6–33.0)
MCHC: 30 g/dL — ABNORMAL LOW (ref 31.5–35.7)
MCV: 68 fL — ABNORMAL LOW (ref 79–97)
Platelets: 220 10*3/uL (ref 150–450)
RBC: 4.88 x10E6/uL (ref 4.14–5.80)
RDW: 17.7 % — ABNORMAL HIGH (ref 11.6–15.4)
WBC: 4.9 10*3/uL (ref 3.4–10.8)

## 2022-08-19 ENCOUNTER — Other Ambulatory Visit: Payer: Self-pay | Admitting: Orthopaedic Surgery

## 2022-08-19 DIAGNOSIS — M5116 Intervertebral disc disorders with radiculopathy, lumbar region: Secondary | ICD-10-CM

## 2022-08-19 DIAGNOSIS — M5416 Radiculopathy, lumbar region: Secondary | ICD-10-CM

## 2022-08-19 DIAGNOSIS — M4326 Fusion of spine, lumbar region: Secondary | ICD-10-CM

## 2022-08-24 ENCOUNTER — Other Ambulatory Visit: Payer: Medicare HMO

## 2022-08-24 ENCOUNTER — Telehealth: Payer: Self-pay

## 2022-08-27 ENCOUNTER — Ambulatory Visit (INDEPENDENT_AMBULATORY_CARE_PROVIDER_SITE_OTHER): Payer: Medicare HMO | Admitting: Medical

## 2022-08-27 ENCOUNTER — Other Ambulatory Visit: Payer: Self-pay

## 2022-08-27 ENCOUNTER — Telehealth: Payer: Self-pay | Admitting: Medical

## 2022-08-27 VITALS — BP 120/78 | HR 64 | Temp 97.6°F | Wt 234.2 lb

## 2022-08-27 DIAGNOSIS — I700 Atherosclerosis of aorta: Secondary | ICD-10-CM

## 2022-08-27 DIAGNOSIS — C61 Malignant neoplasm of prostate: Secondary | ICD-10-CM

## 2022-08-27 DIAGNOSIS — D649 Anemia, unspecified: Secondary | ICD-10-CM | POA: Diagnosis not present

## 2022-08-27 DIAGNOSIS — E1165 Type 2 diabetes mellitus with hyperglycemia: Secondary | ICD-10-CM

## 2022-08-27 DIAGNOSIS — J449 Chronic obstructive pulmonary disease, unspecified: Secondary | ICD-10-CM

## 2022-08-27 DIAGNOSIS — J439 Emphysema, unspecified: Secondary | ICD-10-CM

## 2022-08-27 DIAGNOSIS — Z79899 Other long term (current) drug therapy: Secondary | ICD-10-CM

## 2022-08-27 DIAGNOSIS — R5383 Other fatigue: Secondary | ICD-10-CM

## 2022-08-27 MED ORDER — ALBUTEROL SULFATE HFA 108 (90 BASE) MCG/ACT IN AERS: 2.0000 | INHALATION_SPRAY | Freq: Four times a day (QID) | RESPIRATORY_TRACT | 0 refills | Status: DC | PRN

## 2022-08-27 NOTE — Telephone Encounter (Signed)
Pt saw shane today and did not get his albuterol refilled and needs please refill to [facility]. Pt can be reached at [phone].

## 2022-08-27 NOTE — Progress Notes (Signed)
I have faxed this to [facility] for overnight oxiemtry

## 2022-08-27 NOTE — Progress Notes (Signed)
Subjective:  Joel [name] is a 82 y.o. male who presents for Chief Complaint  Patient presents with   Fatigue    Fatigue and weakness spell, started Wednesday, feels like he wants to sleep all the time. No energy     Patient Care Team: Cristino Degroff, Kermit Balo, PA-C as PCP - General (Family Medicine) Kathleene Hazel, MD as PCP - Cardiology (Cardiology) Lanier Prude, MD as PCP - Electrophysiology (Cardiology) Kathleene Hazel, MD as Consulting Physician (Cardiology) Clinic, [facility]ian, [facility]arn[facility] Slates, [facility] (Inactive) as Pharmacist (Pharmacist) Marinus Maw, MD as Consulting Physician (Cardiology) Sallye Lat, MD as Consulting Physician (Ophthalmology) Santiago Glad, MD as Referring Physician (Specialist) Chuck Hint, MD as Consulting Physician (Vascular Surgery) Felecia Shelling, DPM as Consulting Physician (Podiatry) Francesco Runner, MD as Referring Physician (Hematology and Oncology)  Here for fatigue, feeling weak.   Feels tired, lazy and wants to sleep.    No SOB, no chest pain, no palpitations.   No swelling.  Feet felt a little funny the other day.   No numbness or tingling.  No unilateral weakness.   Has headaches in general, saw headache clinic last week and had some treatments, injections in head/scalp.  No slurred speech.  Not getting a lot of sleep in general staying up late watching tv    Not currently taking iron.  Hasn't been using albuterol.    No other aggravating or relieving factors.    No other c/o.  Past Medical History:  Diagnosis Date   Atypical chest pain    CAD (coronary artery disease)    a. Multiple prior evaluations then 05/2012 s/p BMS to OM2. b. Nuc 11/2012 wnl. c. Cath 2015: patent stent, moderate LAD/RCA dz, treated medically.   Cataract    CHEST PAIN UNSPECIFIED 09/13/2008   Qualifier: Diagnosis of  By: Trevor Iha, RN, Heather     CKD (chronic kidney disease), stage II    COPD (chronic obstructive  pulmonary disease) (HCC)    Cough secondary to angiotensin converting enzyme inhibitor (ACE-I)    Diabetes mellitus (HCC) 09/07/2019   Diabetes mellitus without complication (HCC)    Diverticulosis    DVT (deep venous thrombosis) (HCC)    Family history of breast cancer in first degree relative    GERD (gastroesophageal reflux disease)    Headache(784.0)    Hx: of   History of blood transfusion    "I've had 2; don't know what it was related to" (11/09/2012)   HLD (hyperlipidemia)    HTN (hypertension)    Iron deficiency anemia    Lymphoproliferative disorder, low grade B cell (HCC) 05/21/2020   Microcytic anemia    Peripheral vascular disease (HCC)    a. L pop-tibial bypass 2011, followed by [facility].   Personal history of prostate cancer    Rotator cuff injury    "left arm; never repaired" (11/09/2012)   Current Outpatient Medications on File Prior to Visit  Medication Sig Dispense Refill   abiraterone acetate (ZYTIGA) 250 MG tablet Take 250 mg by mouth daily.     amLODipine (NORVASC) 10 MG tablet TAKE 1 TABLET(10 MG) BY MOUTH DAILY 90 tablet 3   aspirin EC 81 MG tablet Take 1 tablet (81 mg total) by mouth daily. Swallow whole. 90 tablet 3   brimonidine (ALPHAGAN) 0.2 % ophthalmic solution Place 1 drop into both eyes 2 (two) times daily.     carboxymethylcellul-glycerin (REFRESH RELIEVA) 0.5-0.9 % ophthalmic solution Place 1 drop into both eyes daily  as needed for dry eyes.     diclofenac Sodium (VOLTAREN) 1 % GEL Apply 2 g topically 2 (two) times daily as needed (back pain).     gabapentin (NEURONTIN) 600 MG tablet Take 600 mg by mouth 2 (two) times daily.     isosorbide mononitrate (IMDUR) 120 MG 24 hr tablet Take 1 tablet (120 mg total) by mouth daily. 90 tablet 2   latanoprost (XALATAN) 0.005 % ophthalmic solution Place 1 drop into both eyes at bedtime.     omeprazole (PRILOSEC) 20 MG capsule Take 20 mg by mouth daily.     polyethylene glycol powder (GLYCOLAX/MIRALAX) 17 GM/SCOOP powder  Take 17 g by mouth daily. 289 g 0   pravastatin (PRAVACHOL) 40 MG tablet TAKE 1 TABLET(40 MG) BY MOUTH EVERY EVENING 90 tablet 2   predniSONE (DELTASONE) 5 MG tablet Take 5 mg by mouth daily.     tamsulosin (FLOMAX) 0.4 MG CAPS capsule Take 0.4 mg by mouth daily.     traMADol (ULTRAM) 50 MG tablet TAKE 1 TABLET(50 MG) BY MOUTH EVERY 12 HOURS AS NEEDED 30 tablet 0   Accu-Chek FastClix Lancets MISC Test once daily 102 each 12   albuterol (VENTOLIN HFA) 108 (90 Base) MCG/ACT inhaler INHALE 2 PUFFS INTO THE LUNGS EVERY 6 HOURS AS NEEDED FOR WHEEZING OR SHORTNESS OF BREATH (Patient not taking: Reported on 08/27/2022) 54 g 0   nitroGLYCERIN (NITROSTAT) 0.4 MG SL tablet Place 1 tablet (0.4 mg total) under the tongue every 5 (five) minutes as needed for chest pain (max 3 doses). 75 tablet 2   No current facility-administered medications on file prior to visit.     The following portions of the patient's history were reviewed and updated as appropriate: allergies, current medications, past family history, past medical history, past social history, past surgical history and problem list.  Review of Systems  Constitutional:  Positive for malaise/fatigue. Negative for chills, diaphoresis, fever and weight loss.  HENT: Negative.    Eyes: Negative.   Respiratory:  Negative for cough, shortness of breath and wheezing.   Cardiovascular:  Negative for chest pain, palpitations, orthopnea and leg swelling.  Gastrointestinal:  Positive for constipation. Negative for abdominal pain, diarrhea, nausea and vomiting.  Genitourinary:  Negative for dysuria, hematuria and urgency.  Musculoskeletal:  Negative for myalgias.  Neurological:  Positive for weakness and headaches. Negative for dizziness, tingling, speech change, focal weakness and loss of consciousness.      Objective: BP 120/78   Pulse 64   Temp 97.6 F (36.4 C)   Wt 234 lb 3.2 oz (106.2 kg)   SpO2 93%   BMI 34.59 kg/m   BP Readings from Last 3  Encounters:  08/27/22 120/78  08/13/22 (!) 150/82  06/28/22 130/60   Wt Readings from Last 3 Encounters:  08/27/22 234 lb 3.2 oz (106.2 kg)  08/13/22 236 lb 9.6 oz (107.3 kg)  06/28/22 239 lb (108.4 kg)    General appearance: alert, no distress, well developed, well nourished HEENT: normocephalic, sclerae anicteric, conjunctiva pale, TMs pearly, nares patent, no discharge or erythema, pharynx normal Oral cavity: MMM, no lesions Neck: supple, no lymphadenopathy, no thyromegaly, no masses, no jvd or bruits Heart: RRR, normal S1, S2, no murmurs Lungs: decrease lung sounds in general, no wheezes, rhonchi, or rales Abdomen: +bs, soft, non tender, non distended, no masses, no hepatomegaly, no splenomegaly Pulses: 2+ radial pulses, 2+ pedal pulses, normal cap refill Ext: no edema Neuro: CN II through XII intact, nonfocal exam  Uses cane Psych: Pleasant, answers question appropriately     Assessment: Encounter Diagnoses  Name Primary?   Tiredness Yes   Anemia, unspecified type    Aortic atherosclerosis (HCC)    Controlled type 2 diabetes mellitus with hyperglycemia, without long-term current use of insulin (HCC)    Carcinoma of prostate (HCC)    Pulmonary emphysema, unspecified emphysema type (HCC)      Plan: We discussed his main concern is fatigue and weakness and possible causes  He has underlying COPD.  Seated he had a pulse ox of 93%.  Walking around in the hallway he had a pulse ox of 96%.  His lung sounds are decreased in general although he does not really report wheezing or shortness of breath.  He went for sleep study but because they were going to require credit card to utilize payment he declined and would not let them have a credit card for debit card.  They would let him pay cash that he refuses to do CPAP therapy  He has been low on iron and iron deficient anemia in the past.  Update iron levels today.  Recent blood count shows chronic but stable hemoglobin of  around 10  Consider overnight oximetry  Diabetes-not currently on medication, diet controlled.  He is not checking glucose currently.  Update hemoglobin A1c today  Followed by urology for history of prostate cancer, on therapy  Emphysema/COPD not currently on medication-I advised to use his albuterol twice daily for the next week and see if that makes a difference in his fatigue or energy given his lung sounds.  I will order an overnight oximetry test as well.  We tried to do spirometry today but he could not seem to get enough effort although he denies wheezing or shortness of breath.  He is set to have Watchman procedure next week per cardiology  Maleko was seen today for fatigue.  Diagnoses and all orders for this visit:  Tiredness -     Spirometry with Graph  Anemia, unspecified type -     Iron, TIBC and Ferritin Panel  Aortic atherosclerosis (HCC)  Controlled type 2 diabetes mellitus with hyperglycemia, without long-term current use of insulin (HCC) -     Hemoglobin A1c  Carcinoma of prostate (HCC)  Pulmonary emphysema, unspecified emphysema type (HCC)    Follow up: pending labs

## 2022-08-28 LAB — HEMOGLOBIN A1C
Est. average glucose Bld gHb Est-mCnc: 143 mg/dL
Hgb A1c MFr Bld: 6.6 % — ABNORMAL HIGH (ref 4.8–5.6)

## 2022-08-28 LAB — IRON,TIBC AND FERRITIN PANEL
Ferritin: 237 ng/mL (ref 30–400)
Iron Saturation: 13 % — ABNORMAL LOW (ref 15–55)
Iron: 33 ug/dL — ABNORMAL LOW (ref 38–169)
Total Iron Binding Capacity: 261 ug/dL (ref 250–450)
UIBC: 228 ug/dL (ref 111–343)

## 2022-08-29 NOTE — Progress Notes (Signed)
Diabetes marker 6.6% stable.  Iron is low though.  Find out what types of iron he tolerated in the past as I need to restart him on some type of iron  Examples would be ferrous gluconate or ferrous sulfate 1 tablet once or twice daily,Nu Iron polysaccharide or tandem polysaccharide iron  He mention the other day that he did not tolerate a certain type of iron  Let him know we are going to do an overnight oxygen test as well

## 2022-08-30 ENCOUNTER — Other Ambulatory Visit: Payer: Self-pay | Admitting: Medical

## 2022-08-30 MED ORDER — FERROUS GLUCONATE 324 (38 FE) MG PO TABS: 324.0000 mg | ORAL_TABLET | Freq: Every day | ORAL | 2 refills | Status: DC

## 2022-08-30 NOTE — Progress Notes (Signed)
Okay, I sent iron Fergon 1 tablet daily to begin.  Lets plan on rechecking iron levels and blood counts in 1-2 months

## 2022-08-31 ENCOUNTER — Other Ambulatory Visit: Payer: Self-pay | Admitting: Medical

## 2022-08-31 ENCOUNTER — Telehealth: Payer: Self-pay | Admitting: Cardiology

## 2022-08-31 DIAGNOSIS — E611 Iron deficiency: Secondary | ICD-10-CM

## 2022-08-31 NOTE — Telephone Encounter (Signed)
  [facility]   Watchman Team  Spoke to patient regarding upcoming procedure on 09/02/22 with Dr. Lalla Brothers. Confirmed patient arrival time of 11am. He reports uninterrupted ASA 81mg  QD therapy. Patient concerned about [facility] pharmacy stocking Zytiga. Called and personally spoke to [facility] main pharmacy who report they do not stock this medication. Plan will be for him to take this the morning of his procedure and will restart the next day with home dose. Discussed with the patient who was thankful for this call. All other questions answered.   Georgie Chard NP-C Structural Heart Team  Pager: [phone] Phone: [phone]

## 2022-08-31 NOTE — Telephone Encounter (Signed)
  [facility]   Watchman Team  Attempted pre procedure call for upcoming Watchman procedure scheduled 09/02/22 at [facility] with an arrival time of 11am.   Unfortunately the patient was unavailable at the time of the call with no answer. Left voice message to return call. Will attempt later today.    Georgie Chard NP-C Structural Heart Team  Pager: [phone] Phone: [phone]

## 2022-09-01 ENCOUNTER — Encounter (HOSPITAL_COMMUNITY): Payer: Self-pay | Admitting: Cardiology

## 2022-09-01 NOTE — Progress Notes (Signed)
Anesthesia Chart Review: SAME DAY WORK-UP  Case: [id number] Date/Time: 09/02/22 1330   Procedures:      LEFT ATRIAL APPENDAGE OCCLUSION     TRANSESOPHAGEAL ECHOCARDIOGRAM   Anesthesia type: General   Diagnosis: PAF (paroxysmal atrial fibrillation) (HCC) [I48.0]   Pre-op diagnosis: afib   [facility] C[facility] 6 / [facility]   Providers: Lanier Prude, MD       DISCUSSION: Patient is an 82 year old male scheduled for the above procedure.   History includes former smoker (quit 05/26/11), COPD, HTN, HLD, DM2, GERD, PVD (with bilateral CIA aneurysms and occluded right hypogastric artery s/p endovascular repair 07/2009 at [facility], complicated by distal embolization to LLE s/p left BK popliteal to PT bypass 09/2009), CAD (with history of angina s/p BMS OM2 05/30/12), high grade AV block (s/p dual chamber St. 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   PAF has been treated with ASA due to recurrent GI bleeding. Per cardiology, "He will continue ASA until his procedure. Plan to load with Plavix 300mg  day of implant and continue DAPT with ASA and Plavix through 6 months."  He is on Zytiga and prednisone 5 mg daily for prostate cancer.  Anesthesia team to evaluate on the day of procedure. He has a Engineer, water. Jude 2272 Assurity MRI PPM.     VS:  BP Readings from Last 3 Encounters:  08/27/22 120/78  08/13/22 (!) 150/82  06/28/22 130/60   Pulse Readings from Last 3 Encounters:  08/27/22 64  08/13/22 67  06/28/22 64    PROVIDERS: Jac Canavan, PA-C is PCP  Verne Carrow, MD is primary cardiologist Lewayne Bunting, MD and Steffanie Dunn, MD are EP cardiologists Waverly Ferrari, MD is vascular surgeon Antonieta Pert, MD is HEM-ONC [facility]). Previously saw Wyvonnia Lora, MD.   LABS: Most recent lab results in [facility] include: Lab Results  Component Value Date   WBC 4.9 08/13/2022   HGB 10.0 (L) 08/13/2022   HCT 33.3 (L) 08/13/2022   PLT 220 08/13/2022   GLUCOSE 139 (H) 08/13/2022   ALT 13 05/24/2022   AST 17 05/24/2022   NA 141 08/13/2022   K 3.9 08/13/2022   CL 104 08/13/2022   CREATININE 0.95 08/13/2022   BUN 8 08/13/2022   CO2 22 08/13/2022   INR 1.0 05/12/2022   HGBA1C 6.6 (H) 08/27/2022     OTHER: Sleep Study 08/31/21: IMPRESSIONS - Severe obstructive sleep apnea occurred during the diagnostic portion of the study (AHI = 65.8 /hour). An optimal PAP pressure could not be selected do to ongoing respiratory events.  - Mild central sleep apnea occurred during the diagnostic portion of the study (CAI = 0.9/hour). - Severe oxygen desaturation was noted during the diagnostic portion of the study (Min O2 = 58.0%). - The patient snored with moderate snoring volume during the diagnostic portion of the study. - No cardiac abnormalities were noted during this study. - Clinically significant periodic limb movements of sleep did not occur during the study.   BiPAP Titration Study 12/10/21: IMPRESSIONS - An optimal PAP pressure was selected for this patient ( 27 / cm of water) - Mild Central Sleep Apnea was noted during this titration (CAI = 12.6/h). - Severe oxygen desaturations were observed during this titration (min O2 =  60.0%). - The patient snored with moderate snoring volume. - No cardiac abnormalities were observed during this study. - Clinically significant periodic limb movements were not noted during this study. Arousals associated with PLMs were rare. RECOMMENDATIONS - Trial of autoBiPAP therapy with IPAP max 22 cm H2O, EPAP min 5cm H2O and PS 4cm H2O with a Large size Resmed Full Face Mirage Quattro mask and heated humidification...  Normal spirometry 07/03/20.   IMAGES: CXR  06/26/22: FINDINGS: Left chest multi lead pacer. The heart size and mediastinal contours are within normal limits. Both lungs are clear. Disc degenerative disease of the thoracic spine. IMPRESSION: No acute abnormality of the lungs.   CT Abd/pelvis 05/24/22: IMPRESSION: 1. No acute intra-abdominal process. 2. Nonobstructive left renal calculi. 3. Diverticulosis without diverticulitis. 4. Small hiatal hernia. 5. Aortic atherosclerosis with dilatation of the juxtarenal aorta measuring 3.3 cm, decreased in size. 6. Right common iliac artery aneurysm measuring 4.4 cm with aortic and iliac artery endograft, not significantly changed.    EKG: 08/13/2022: Atrial sensed ventricular paced rhythm.   CV: MRI Cardiac 05/21/22: IMPRESSION: 1. The left atrial appendage is a medium size chicken wing type with 2 lobes that is anteriorly directed. There is no thrombus. 2. A 24 mm Watchman FLX device is recommended based on the above landing zone measurements (18.2 mm maximum diameter; 24% compression). Could consider a 20 mm device based on average diameter. 3. An inferior posterior IAS puncture site is recommended. 4. Optimal deployment angle: RAO 14 CAU 0 5. Severe 3-vessel coronary calcifications.  Prior history of PCI. 6. 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 7. Dilated pulmonary artery suggestive of pulmonary hypertension.    Echo 04/16/22: IMPRESSIONS   1. Left ventricular ejection fraction, by estimation, is 60 to 65%. The  left ventricle has normal function. The left ventricle has no regional  wall motion abnormalities. Left ventricular diastolic parameters are  consistent with Grade I diastolic  dysfunction (impaired relaxation). The average left ventricular global  longitudinal strain is -21.2 %. The global longitudinal strain is normal.   2. Right ventricular systolic function is normal. The right ventricular  size is normal.   3. The mitral valve is normal in structure. No evidence of  mitral valve  regurgitation. No evidence of mitral stenosis.   4. The aortic valve is tricuspid. Aortic valve regurgitation is mild.  Aortic valve sclerosis/calcification is present, without any evidence of  aortic stenosis.   5. Aortic dilatation noted. There is mild dilatation of the aortic root,  measuring 39 mm. There is mild dilatation of the ascending aorta,  measuring 40 mm.    Nuclear stress test 04/17/21:   The study is normal. The study is low risk.   No ST deviation was noted.   LV perfusion is normal. There is no evidence of ischemia. There is no evidence of infarction.   Left ventricular function is normal. Nuclear stress EF: 61 %. The left ventricular ejection fraction is normal (55-65%). End diastolic cavity size is normal. End systolic cavity size is normal.   Prior study available for comparison from 03/10/2017. Normal resting and stress perfusion. No ischemia or infarction EF 61%.   Cardiac cath 08/01/19: Prox RCA lesion is 40% stenosed. Mid RCA lesion is 40% stenosed. Mid RCA to Dist RCA lesion is 50% stenosed. RPDA lesion is 70% stenosed. 3rd RPL lesion is 70% stenosed. Prox Cx to Dist Cx lesion is 40% stenosed. Prox LAD to Mid LAD lesion is 40% stenosed. Mid LAD  lesion is 40% stenosed. Dist LAD lesion is 70% stenosed. The left ventricular systolic function is normal. LV end diastolic pressure is normal. The left ventricular ejection fraction is 55-65% by visual estimate. There is no mitral valve regurgitation.   1. Moderate non-obstructive disease in the LAD and Circumflex 2. Moderate non-obstructive disease in the RCA. Severe disease in the small caliber PDA, too small for PCI 3. Normal LV systolic function   Recommendations: Continue medical management of CAD     Past Medical History:  Diagnosis Date   Atypical chest pain    CAD (coronary artery disease)    a. Multiple prior evaluations then 05/2012 s/p BMS to OM2. b. Nuc 11/2012 wnl. c. Cath 2015:  patent stent, moderate LAD/RCA dz, treated medically.   Cataract    CHEST PAIN UNSPECIFIED 09/13/2008   Qualifier: Diagnosis of  By: Trevor Iha, RN, Heather     CKD (chronic kidney disease), stage II    COPD (chronic obstructive pulmonary disease) (HCC)    Cough secondary to angiotensin converting enzyme inhibitor (ACE-I)    Diabetes mellitus (HCC) 09/07/2019   Diabetes mellitus without complication (HCC)    Diverticulosis    DVT (deep venous thrombosis) (HCC)    Family history of breast cancer in first degree relative    GERD (gastroesophageal reflux disease)    Headache(784.0)    Hx: of   History of blood transfusion    "I've had 2; don't know what it was related to" (11/09/2012)   HLD (hyperlipidemia)    HTN (hypertension)    Iron deficiency anemia    Lymphoproliferative disorder, low grade B cell (HCC) 05/21/2020   Microcytic anemia    Peripheral vascular disease (HCC)    a. L pop-tibial bypass 2011, followed by [facility].   Personal history of prostate cancer    Rotator cuff injury    "left arm; never repaired" (11/09/2012)    Past Surgical History:  Procedure Laterality Date   CARDIOVASCULAR STRESS TEST  Aug. 2014   CATARACT EXTRACTION W/ INTRAOCULAR LENS IMPLANT Right ~ 2008   CIRCUMCISION     COLONOSCOPY     CORONARY ANGIOPLASTY WITH STENT PLACEMENT  2014   "1" (11/09/2012)   ILIAC ARTERY ANEURYSM REPAIR     LEFT HEART CATH  05-31-12   LEFT HEART CATH AND CORONARY ANGIOGRAPHY N/A 08/01/2019   Procedure: LEFT HEART CATH AND CORONARY ANGIOGRAPHY;  Surgeon: Kathleene Hazel, MD;  [facility];  Service: Cardiovascular;  Laterality: N/A;   LEFT HEART CATHETERIZATION WITH CORONARY ANGIOGRAM N/A 06/01/2011   Procedure: LEFT HEART CATHETERIZATION WITH CORONARY ANGIOGRAM;  Surgeon: Herby Abraham, MD;  Location: [facility] CATH LAB;  Service: Cardiovascular;  Laterality: N/A;   LEFT HEART CATHETERIZATION WITH CORONARY ANGIOGRAM N/A 05/31/2012   Procedure: LEFT HEART  CATHETERIZATION WITH CORONARY ANGIOGRAM;  Surgeon: Peter M Swaziland, MD;  Location: [facility] CATH LAB;  Service: Cardiovascular;  Laterality: N/A;   LEFT HEART CATHETERIZATION WITH CORONARY ANGIOGRAM N/A 08/01/2013   Procedure: LEFT HEART CATHETERIZATION WITH CORONARY ANGIOGRAM;  Surgeon: Kathleene Hazel, MD;  Location: [facility] CATH LAB;  Service: Cardiovascular;  Laterality: N/A;   PACEMAKER IMPLANT N/A 09/03/2021   Procedure: PACEMAKER IMPLANT;  Surgeon: Marinus Maw, MD;  [facility];  Service: Cardiovascular;  Laterality: N/A;   PERCUTANEOUS CORONARY STENT INTERVENTION (PCI-S)  05/31/2012   Procedure: PERCUTANEOUS CORONARY STENT INTERVENTION (PCI-S);  Surgeon: Peter M Swaziland, MD;  Location: [facility] CATH LAB;  Service: Cardiovascular;;   PR  VEIN BYPASS GRAFT,AORTO-FEM-POP Left 10/01/2009   left below knee popliteal artery to posterior tibial artery   SHOULDER ARTHROSCOPY WITH OPEN ROTATOR CUFF REPAIR AND DISTAL CLAVICLE ACROMINECTOMY Left 01/12/2013   Procedure: LEFT SHOULDER ARTHROSCOPY WITH DEBRIDEMENT OPEN DISTAL CLAVICLE RESECTION ,acromioplastyAND ROTATOR CUFF REPAIR;  Surgeon: Thera Flake., MD;  Location: [facility];  Service: Orthopedics;  Laterality: Left;    MEDICATIONS: No current facility-administered medications for this encounter.    abiraterone acetate (ZYTIGA) 250 MG tablet   albuterol (VENTOLIN HFA) 108 (90 Base) MCG/ACT inhaler   amLODipine (NORVASC) 10 MG tablet   aspirin EC 81 MG tablet   brimonidine (ALPHAGAN) 0.2 % ophthalmic solution   diclofenac Sodium (VOLTAREN) 1 % GEL   gabapentin (NEURONTIN) 600 MG tablet   isosorbide mononitrate (IMDUR) 120 MG 24 hr tablet   latanoprost (XALATAN) 0.005 % ophthalmic solution   nitroGLYCERIN (NITROSTAT) 0.4 MG SL tablet   omeprazole (PRILOSEC) 20 MG capsule   polyethylene glycol powder (GLYCOLAX/MIRALAX) 17 GM/SCOOP powder   pravastatin (PRAVACHOL) 40 MG tablet   predniSONE (DELTASONE) 5 MG tablet   tamsulosin (FLOMAX) 0.4  MG CAPS capsule   traMADol (ULTRAM) 50 MG tablet   Accu-Chek FastClix Lancets MISC   ferrous gluconate (FERGON) 324 MG tablet    Shonna Chock, PA-C Surgical Short Stay/Anesthesiology [facility] Phone [phone] [facility] Phone [phone] 09/01/2022 2:48 PM

## 2022-09-01 NOTE — Anesthesia Preprocedure Evaluation (Signed)
Anesthesia Evaluation  Patient identified by MRN, date of birth, ID band Patient awake    Reviewed: Allergy & Precautions, NPO status , Patient's Chart, lab work & pertinent test results  History of Anesthesia Complications Negative for: history of anesthetic complications  Airway Mallampati: II  TM Distance: >3 FB Neck ROM: Full    Dental  (+) Edentulous Upper, Edentulous Lower   Pulmonary sleep apnea (does not use CPAP) , COPD,  COPD inhaler, former smoker   breath sounds clear to auscultation       Cardiovascular hypertension, Pt. on medications (-) angina + CAD, + Cardiac Stents and + Peripheral Vascular Disease  + dysrhythmias Atrial Fibrillation + pacemaker  Rhythm:Irregular Rate:Normal  Echo 04/16/22: IMPRESSIONS   1. Left ventricular ejection fraction, by estimation, is 60 to 65%. The  left ventricle has normal function. The left ventricle has no regional  wall motion abnormalities. Left ventricular diastolic parameters are  consistent with Grade I diastolic  dysfunction (impaired relaxation). The average left ventricular global  longitudinal strain is -21.2 %. The global longitudinal strain is normal.   2. Right ventricular systolic function is normal. The right ventricular  size is normal.   3. The mitral valve is normal in structure. No evidence of mitral valve  regurgitation. No evidence of mitral stenosis.   4. The aortic valve is tricuspid. Aortic valve regurgitation is mild.  Aortic valve sclerosis/calcification is present, without any evidence of  aortic stenosis.   5. Aortic dilatation noted. There is mild dilatation of the aortic root,  measuring 39 mm. There is mild dilatation of the ascending aorta,  measuring 40 mm.      Nuclear stress test 04/17/21:   The study is normal. The study is low risk.   No ST deviation was noted.   LV perfusion is normal. There is no evidence of ischemia, no evidence of  infarction.   Left ventricular function is normal. Nuclear stress EF: 61 %. The left ventricular EF is normal (55-65%). End diastolic cavity size is normal. End systolic cavity size is normal.   Prior study available for comparison from 03/10/2017. Normal resting and stress perfusion. No ischemia or infarction EF61%.  '21 cath:  1. Moderate non-obstructive disease in the LAD and Circumflex 2. Moderate non-obstructive disease in the RCA. Severe disease in the small caliber PDA, too small for PCI 3. Normal LV systolic function     Neuro/Psych  Headaches    GI/Hepatic Neg liver ROS,GERD  Medicated and Controlled,,  Endo/Other  diabetes (glu 103)  BMI 34.6  Renal/GU Renal InsufficiencyRenal disease     Musculoskeletal   Abdominal   Peds  Hematology   Anesthesia Other Findings H/o prostate cancer: steroids  Reproductive/Obstetrics                             Anesthesia Physical Anesthesia Plan  ASA: 4  Anesthesia Plan: General   Post-op Pain Management: Tylenol PO (pre-op)*   Induction: Intravenous  PONV Risk Score and Plan: 2 and Ondansetron and Dexamethasone  Airway Management Planned: Oral ETT  Additional Equipment: ClearSight  Intra-op Plan:   Post-operative Plan: Extubation in OR  Informed Consent: I have reviewed the patients History and Physical, chart, labs and discussed the procedure including the risks, benefits and alternatives for the proposed anesthesia with the patient or authorized representative who has indicated his/her understanding and acceptance.       Plan Discussed with: CRNA and  Surgeon  Anesthesia Plan Comments: (PAT note written 09/01/2022 by Shonna Chock, PA-C.  )       Anesthesia Quick Evaluation

## 2022-09-02 ENCOUNTER — Inpatient Hospital Stay (HOSPITAL_COMMUNITY): Payer: Medicare HMO | Admitting: Vascular Surgery

## 2022-09-02 ENCOUNTER — Inpatient Hospital Stay (HOSPITAL_COMMUNITY)
Admission: RE | Admit: 2022-09-02 | Discharge: 2022-09-02 | Disposition: A | Discharge status: Home / Self Care | Payer: Medicare HMO | Source: Ambulatory Visit | Attending: Cardiology | Admitting: Cardiology

## 2022-09-02 ENCOUNTER — Ambulatory Visit (HOSPITAL_COMMUNITY)
Admission: RE | Disposition: A | Discharge status: Home / Self Care | Payer: Medicare HMO | Source: Home / Self Care | Attending: Cardiology

## 2022-09-02 ENCOUNTER — Inpatient Hospital Stay (HOSPITAL_COMMUNITY)
Admission: RE | Admit: 2022-09-02 | Discharge: 2022-09-02 | DRG: 229 | Disposition: A | Discharge status: Home / Self Care | Payer: Medicare HMO | Attending: Cardiology | Admitting: Cardiology

## 2022-09-02 ENCOUNTER — Other Ambulatory Visit: Payer: Self-pay

## 2022-09-02 ENCOUNTER — Inpatient Hospital Stay (HOSPITAL_COMMUNITY): Payer: Medicare HMO

## 2022-09-02 DIAGNOSIS — C884 Extranodal marginal zone B-cell lymphoma of mucosa-associated lymphoid tissue [MALT-lymphoma]: Secondary | ICD-10-CM | POA: Diagnosis present

## 2022-09-02 DIAGNOSIS — E785 Hyperlipidemia, unspecified: Secondary | ICD-10-CM | POA: Diagnosis present

## 2022-09-02 DIAGNOSIS — I2510 Atherosclerotic heart disease of native coronary artery without angina pectoris: Secondary | ICD-10-CM | POA: Diagnosis present

## 2022-09-02 DIAGNOSIS — J449 Chronic obstructive pulmonary disease, unspecified: Secondary | ICD-10-CM | POA: Diagnosis present

## 2022-09-02 DIAGNOSIS — I129 Hypertensive chronic kidney disease with stage 1 through stage 4 chronic kidney disease, or unspecified chronic kidney disease: Secondary | ICD-10-CM | POA: Insufficient documentation

## 2022-09-02 DIAGNOSIS — Z7982 Long term (current) use of aspirin: Secondary | ICD-10-CM

## 2022-09-02 DIAGNOSIS — Z955 Presence of coronary angioplasty implant and graft: Secondary | ICD-10-CM | POA: Diagnosis not present

## 2022-09-02 DIAGNOSIS — Z888 Allergy status to other drugs, medicaments and biological substances status: Secondary | ICD-10-CM | POA: Diagnosis not present

## 2022-09-02 DIAGNOSIS — I10 Essential (primary) hypertension: Secondary | ICD-10-CM | POA: Diagnosis not present

## 2022-09-02 DIAGNOSIS — Z8249 Family history of ischemic heart disease and other diseases of the circulatory system: Secondary | ICD-10-CM | POA: Insufficient documentation

## 2022-09-02 DIAGNOSIS — D47Z9 Other specified neoplasms of uncertain behavior of lymphoid, hematopoietic and related tissue: Secondary | ICD-10-CM | POA: Diagnosis present

## 2022-09-02 DIAGNOSIS — I480 Paroxysmal atrial fibrillation: Secondary | ICD-10-CM

## 2022-09-02 DIAGNOSIS — I712 Thoracic aortic aneurysm, without rupture, unspecified: Secondary | ICD-10-CM | POA: Insufficient documentation

## 2022-09-02 DIAGNOSIS — Z83438 Family history of other disorder of lipoprotein metabolism and other lipidemia: Secondary | ICD-10-CM

## 2022-09-02 DIAGNOSIS — I442 Atrioventricular block, complete: Secondary | ICD-10-CM | POA: Diagnosis present

## 2022-09-02 DIAGNOSIS — Z86718 Personal history of other venous thrombosis and embolism: Secondary | ICD-10-CM | POA: Diagnosis not present

## 2022-09-02 DIAGNOSIS — Z006 Encounter for examination for normal comparison and control in clinical research program: Secondary | ICD-10-CM

## 2022-09-02 DIAGNOSIS — E1151 Type 2 diabetes mellitus with diabetic peripheral angiopathy without gangrene: Secondary | ICD-10-CM | POA: Diagnosis present

## 2022-09-02 DIAGNOSIS — Z825 Family history of asthma and other chronic lower respiratory diseases: Secondary | ICD-10-CM | POA: Diagnosis not present

## 2022-09-02 DIAGNOSIS — K219 Gastro-esophageal reflux disease without esophagitis: Secondary | ICD-10-CM | POA: Diagnosis present

## 2022-09-02 DIAGNOSIS — Z79899 Other long term (current) drug therapy: Secondary | ICD-10-CM | POA: Diagnosis not present

## 2022-09-02 DIAGNOSIS — Z950 Presence of cardiac pacemaker: Secondary | ICD-10-CM

## 2022-09-02 DIAGNOSIS — K921 Melena: Secondary | ICD-10-CM | POA: Insufficient documentation

## 2022-09-02 DIAGNOSIS — N182 Chronic kidney disease, stage 2 (mild): Secondary | ICD-10-CM | POA: Insufficient documentation

## 2022-09-02 DIAGNOSIS — Z7952 Long term (current) use of systemic steroids: Secondary | ICD-10-CM | POA: Diagnosis not present

## 2022-09-02 DIAGNOSIS — Z87891 Personal history of nicotine dependence: Secondary | ICD-10-CM

## 2022-09-02 DIAGNOSIS — Z885 Allergy status to narcotic agent status: Secondary | ICD-10-CM

## 2022-09-02 DIAGNOSIS — C61 Malignant neoplasm of prostate: Secondary | ICD-10-CM | POA: Diagnosis present

## 2022-09-02 DIAGNOSIS — E1122 Type 2 diabetes mellitus with diabetic chronic kidney disease: Secondary | ICD-10-CM | POA: Diagnosis present

## 2022-09-02 DIAGNOSIS — K5790 Diverticulosis of intestine, part unspecified, without perforation or abscess without bleeding: Secondary | ICD-10-CM | POA: Diagnosis present

## 2022-09-02 DIAGNOSIS — I459 Conduction disorder, unspecified: Secondary | ICD-10-CM | POA: Diagnosis present

## 2022-09-02 HISTORY — PX: TEE WITHOUT CARDIOVERSION: SHX5443

## 2022-09-02 HISTORY — DX: Thalassemia minor: D56.3

## 2022-09-02 HISTORY — PX: LEFT ATRIAL APPENDAGE OCCLUSION: EP1229

## 2022-09-02 HISTORY — DX: Presence of cardiac pacemaker: Z95.0

## 2022-09-02 HISTORY — DX: Paroxysmal atrial fibrillation: I48.0

## 2022-09-02 LAB — ECHO TEE

## 2022-09-02 LAB — GLUCOSE, CAPILLARY
Glucose-Capillary: 103 mg/dL — ABNORMAL HIGH (ref 70–99)
Glucose-Capillary: 98 mg/dL (ref 70–99)

## 2022-09-02 LAB — SURGICAL PCR SCREEN
MRSA, PCR: NEGATIVE
Staphylococcus aureus: NEGATIVE

## 2022-09-02 LAB — TYPE AND SCREEN
ABO/RH(D): AB POS
Antibody Screen: NEGATIVE

## 2022-09-02 SURGERY — LEFT ATRIAL APPENDAGE OCCLUSION
Anesthesia: General

## 2022-09-02 MED ORDER — PHENYLEPHRINE 80 MCG/ML (10ML) SYRINGE FOR IV PUSH (FOR BLOOD PRESSURE SUPPORT)
PREFILLED_SYRINGE | INTRAVENOUS | Status: DC | PRN
  Administered 2022-09-02: 160 ug via INTRAVENOUS

## 2022-09-02 MED ORDER — DEXAMETHASONE SODIUM PHOSPHATE 10 MG/ML IJ SOLN
INTRAMUSCULAR | Status: DC | PRN
  Administered 2022-09-02: 10 mg via INTRAVENOUS

## 2022-09-02 MED ORDER — PROTAMINE SULFATE 10 MG/ML IV SOLN
INTRAVENOUS | Status: DC | PRN
  Administered 2022-09-02: 40 mg via INTRAVENOUS

## 2022-09-02 MED ORDER — FENTANYL CITRATE (PF) 100 MCG/2ML IJ SOLN
INTRAMUSCULAR | Status: AC
  Filled 2022-09-02: qty 2

## 2022-09-02 MED ORDER — CHLORHEXIDINE GLUCONATE 0.12 % MT SOLN
OROMUCOSAL | Status: AC
  Administered 2022-09-02: 15 mL via OROMUCOSAL
  Filled 2022-09-02: qty 15

## 2022-09-02 MED ORDER — SODIUM CHLORIDE 0.9 % IV SOLN: 250.0000 mL | INTRAVENOUS | Status: DC | PRN

## 2022-09-02 MED ORDER — HYDRALAZINE HCL 20 MG/ML IJ SOLN: 10.0000 mg | INTRAMUSCULAR | Status: DC | PRN

## 2022-09-02 MED ORDER — INSULIN ASPART 100 UNIT/ML IJ SOLN: 0.0000 [IU] | INTRAMUSCULAR | Status: DC | PRN

## 2022-09-02 MED ORDER — CEFAZOLIN SODIUM-DEXTROSE 2-4 GM/100ML-% IV SOLN
INTRAVENOUS | Status: AC
  Filled 2022-09-02: qty 100

## 2022-09-02 MED ORDER — LACTATED RINGERS IV SOLN: INTRAVENOUS | Status: DC | PRN

## 2022-09-02 MED ORDER — ONDANSETRON HCL 4 MG/2ML IJ SOLN
INTRAMUSCULAR | Status: DC | PRN
  Administered 2022-09-02: 4 mg via INTRAVENOUS

## 2022-09-02 MED ORDER — FENTANYL CITRATE (PF) 250 MCG/5ML IJ SOLN
INTRAMUSCULAR | Status: DC | PRN
  Administered 2022-09-02: 100 ug via INTRAVENOUS

## 2022-09-02 MED ORDER — ROCURONIUM BROMIDE 10 MG/ML (PF) SYRINGE
PREFILLED_SYRINGE | INTRAVENOUS | Status: DC | PRN
  Administered 2022-09-02: 60 mg via INTRAVENOUS

## 2022-09-02 MED ORDER — SODIUM CHLORIDE 0.9 % IV SOLN: INTRAVENOUS | Status: DC

## 2022-09-02 MED ORDER — LIDOCAINE 2% (20 MG/ML) 5 ML SYRINGE
INTRAMUSCULAR | Status: DC | PRN
  Administered 2022-09-02: 20 mg via INTRAVENOUS

## 2022-09-02 MED ORDER — HEPARIN (PORCINE) IN NACL 2000-0.9 UNIT/L-% IV SOLN
INTRAVENOUS | Status: DC | PRN
  Administered 2022-09-02: 1000 mL

## 2022-09-02 MED ORDER — CEFAZOLIN SODIUM-DEXTROSE 2-4 GM/100ML-% IV SOLN
2.0000 g | INTRAVENOUS | Status: AC
  Administered 2022-09-02: 2 g via INTRAVENOUS

## 2022-09-02 MED ORDER — CHLORHEXIDINE GLUCONATE 4 % EX LIQD: Freq: Once | CUTANEOUS | Status: DC

## 2022-09-02 MED ORDER — SODIUM CHLORIDE 0.9% FLUSH: 3.0000 mL | Freq: Two times a day (BID) | INTRAVENOUS | Status: DC

## 2022-09-02 MED ORDER — ACETAMINOPHEN 500 MG PO TABS
1000.0000 mg | ORAL_TABLET | Freq: Once | ORAL | Status: AC
  Administered 2022-09-02: 1000 mg via ORAL
  Filled 2022-09-02: qty 2

## 2022-09-02 MED ORDER — PHENYLEPHRINE HCL-NACL 20-0.9 MG/250ML-% IV SOLN
INTRAVENOUS | Status: DC | PRN
  Administered 2022-09-02: 40 ug/min via INTRAVENOUS

## 2022-09-02 MED ORDER — PROPOFOL 10 MG/ML IV BOLUS
INTRAVENOUS | Status: DC | PRN
  Administered 2022-09-02: 130 mg via INTRAVENOUS

## 2022-09-02 MED ORDER — SODIUM CHLORIDE 0.9% FLUSH: 3.0000 mL | INTRAVENOUS | Status: DC | PRN

## 2022-09-02 MED ORDER — CHLORHEXIDINE GLUCONATE 0.12 % MT SOLN: 15.0000 mL | Freq: Once | OROMUCOSAL | Status: AC

## 2022-09-02 MED ORDER — SODIUM CHLORIDE 0.9 % IV SOLN: INTRAVENOUS | Status: AC

## 2022-09-02 MED ORDER — SUGAMMADEX SODIUM 200 MG/2ML IV SOLN
INTRAVENOUS | Status: DC | PRN
  Administered 2022-09-02: 200 mg via INTRAVENOUS

## 2022-09-02 MED ORDER — HEPARIN (PORCINE) IN NACL 1000-0.9 UT/500ML-% IV SOLN
INTRAVENOUS | Status: DC | PRN
  Administered 2022-09-02: 500 mL

## 2022-09-02 MED ORDER — HEPARIN SODIUM (PORCINE) 1000 UNIT/ML IJ SOLN
INTRAMUSCULAR | Status: DC | PRN
  Administered 2022-09-02: 15000 [IU] via INTRAVENOUS

## 2022-09-02 SURGICAL SUPPLY — 17 items
BAG SNAP BAND KOVER 36X36 (MISCELLANEOUS) IMPLANT
CATH DIAG 6FR PIGTAIL ANGLED (CATHETERS) IMPLANT
CATH INFINITI 4FR 145 PIGTAIL (CATHETERS) IMPLANT
CLOSURE PERCLOSE PROSTYLE (VASCULAR PRODUCTS) IMPLANT
DILATOR VESSEL 38 20CM 12FR (INTRODUCER) IMPLANT
GUIDEWIRE SAFE TJ AMPLATZ EXST (WIRE) IMPLANT
KIT HEART LEFT (KITS) ×1 IMPLANT
KIT SHEA VERSACROSS LAAC CONNE (KITS) IMPLANT
PACK CARDIAC CATHETERIZATION (CUSTOM PROCEDURE TRAY) ×1 IMPLANT
PAD DEFIB RADIO PHYSIO CONN (PAD) ×1 IMPLANT
SHEATH PERFORMER 16FR 30 (SHEATH) IMPLANT
SHEATH PINNACLE 8F 10CM (SHEATH) IMPLANT
SHEATH PROBE COVER 6X72 (BAG) ×1 IMPLANT
SYS WATCHMAN FXD DBL (SHEATH) ×1
SYSTEM WATCHMAN FXD DBL (SHEATH) IMPLANT
TRANSDUCER W/STOPCOCK (MISCELLANEOUS) ×1 IMPLANT
TUBING CIL FLEX 10 FLL-RA (TUBING) ×1 IMPLANT

## 2022-09-02 NOTE — H&P (Signed)
Electrophysiology Office Note:     Date:  09/02/2022    ID:  Joel [name], DOB 1940-11-01, MRN [id number]   PCP:  Jac Canavan, PA-C      [facility] Cardiologist:  Verne Carrow, MD  [facility] Electrophysiologist:  Lanier Prude, MD    Referring MD: Jac Canavan, PA-C    Chief Complaint: Atrial fibrillation   History of Present Illness:     Joel [name] is a 82 y.o. male who presents for an evaluation of atrial fibrillation at the request of Dr. Clifton James. Their medical history includes hypertension, hyperlipidemia, coronary artery disease, diabetes, complete heart block post permanent pacemaker, paroxysmal atrial fibrillation and GI bleeding.  The patient last saw Dr. Clifton James March 17, 2022.   His diagnosis of atrial fibrillation was made by his pacemaker.  He was started on anticoagulation but developed GI bleeding.  The GI bleeding was severe enough to require stopping the anticoagulant and aspirin.  At the appointment with Dr. Clifton James, his aspirin was restarted.  Given the GI bleeding, he was referred to discuss watchman implant.   Today, he confirms a history of bleeding issues on anticoagulation. Currently he is back on 81 mg ASA. We reviewed the procedural details and risks of the watchman implant at length.   He denies any palpitations, chest pain, shortness of breath, or peripheral edema. No lightheadedness, headaches, syncope, orthopnea, or PND.   Presents for LAAO today.       Objective      Past Medical History:  Diagnosis Date   Atypical chest pain     CAD (coronary artery disease)      a. Multiple prior evaluations then 05/2012 s/p BMS to OM2. b. Nuc 11/2012 wnl. c. Cath 2015: patent stent, moderate LAD/RCA dz, treated medically.   Cataract     CHEST PAIN UNSPECIFIED 09/13/2008    Qualifier: Diagnosis of  By: Trevor Iha, RN, Heather     CKD (chronic kidney disease), stage II     COPD (chronic obstructive pulmonary disease) (HCC)      Cough secondary to angiotensin converting enzyme inhibitor (ACE-I)     Diabetes mellitus (HCC) 09/07/2019   Diabetes mellitus without complication (HCC)     Diverticulosis     DVT (deep venous thrombosis) (HCC)     Family history of breast cancer in first degree relative     GERD (gastroesophageal reflux disease)     Headache(784.0)      Hx: of   History of blood transfusion      "I've had 2; don't know what it was related to" (11/09/2012)   HLD (hyperlipidemia)     HTN (hypertension)     Iron deficiency anemia     Lymphoproliferative disorder, low grade B cell (HCC) 05/21/2020   Microcytic anemia     Peripheral vascular disease (HCC)      a. L pop-tibial bypass 2011, followed by [facility].   Personal history of prostate cancer     Rotator cuff injury      "left arm; never repaired" (11/09/2012)           Past Surgical History:  Procedure Laterality Date   CARDIOVASCULAR STRESS TEST   Aug. 2014   CATARACT EXTRACTION W/ INTRAOCULAR LENS IMPLANT Right ~ 2008   CIRCUMCISION       COLONOSCOPY       CORONARY ANGIOPLASTY WITH STENT PLACEMENT   2014    "1" (11/09/2012)   ILIAC ARTERY ANEURYSM REPAIR  LEFT HEART CATH   05-31-12   LEFT HEART CATH AND CORONARY ANGIOGRAPHY N/A 08/01/2019    Procedure: LEFT HEART CATH AND CORONARY ANGIOGRAPHY;  Surgeon: Kathleene Hazel, MD;  [facility]: [facility];  Service: Cardiovascular;  Laterality: N/A;   LEFT HEART CATHETERIZATION WITH CORONARY ANGIOGRAM N/A 06/01/2011    Procedure: LEFT HEART CATHETERIZATION WITH CORONARY ANGIOGRAM;  Surgeon: Herby Abraham, MD;  Location: [facility] CATH LAB;  Service: Cardiovascular;  Laterality: N/A;   LEFT HEART CATHETERIZATION WITH CORONARY ANGIOGRAM N/A 05/31/2012    Procedure: LEFT HEART CATHETERIZATION WITH CORONARY ANGIOGRAM;  Surgeon: Peter M Swaziland, MD;  Location: [facility] CATH LAB;  Service: Cardiovascular;  Laterality: N/A;   LEFT HEART CATHETERIZATION WITH CORONARY ANGIOGRAM N/A 08/01/2013    Procedure:  LEFT HEART CATHETERIZATION WITH CORONARY ANGIOGRAM;  Surgeon: Kathleene Hazel, MD;  Location: [facility] CATH LAB;  Service: Cardiovascular;  Laterality: N/A;   PACEMAKER IMPLANT N/A 09/03/2021    Procedure: PACEMAKER IMPLANT;  Surgeon: Marinus Maw, MD;  Location: [facility];  Service: Cardiovascular;  Laterality: N/A;   PERCUTANEOUS CORONARY STENT INTERVENTION (PCI-S)   05/31/2012    Procedure: PERCUTANEOUS CORONARY STENT INTERVENTION (PCI-S);  Surgeon: Peter M Swaziland, MD;  Location: [facility] CATH LAB;  Service: Cardiovascular;;   PR VEIN BYPASS GRAFT,AORTO-FEM-POP Left 10/01/2009    left below knee popliteal artery to posterior tibial artery   SHOULDER ARTHROSCOPY WITH OPEN ROTATOR CUFF REPAIR AND DISTAL CLAVICLE ACROMINECTOMY Left 01/12/2013    Procedure: LEFT SHOULDER ARTHROSCOPY WITH DEBRIDEMENT OPEN DISTAL CLAVICLE RESECTION ,acromioplastyAND ROTATOR CUFF REPAIR;  Surgeon: Thera Flake., MD;  Location: [facility] OR;  Service: Orthopedics;  Laterality: Left;      Current Medications: Active Medications      Current Meds  Medication Sig   abiraterone acetate (ZYTIGA) 250 MG tablet Take 250 mg by mouth daily.   Accu-Chek FastClix Lancets MISC Test once daily   albuterol (VENTOLIN HFA) 108 (90 Base) MCG/ACT inhaler INHALE 2 PUFFS INTO THE LUNGS EVERY 6 HOURS AS NEEDED FOR WHEEZING OR SHORTNESS OF BREATH   amLODipine (NORVASC) 10 MG tablet TAKE 1 TABLET(10 MG) BY MOUTH DAILY   aspirin EC 81 MG tablet Take 1 tablet (81 mg total) by mouth daily. Swallow whole.   brimonidine (ALPHAGAN) 0.2 % ophthalmic solution Place 1 drop into both eyes 2 (two) times daily.   carboxymethylcellul-glycerin (REFRESH RELIEVA) 0.5-0.9 % ophthalmic solution Place 1 drop into both eyes daily as needed for dry eyes.   diclofenac Sodium (VOLTAREN) 1 % GEL Apply 2 g topically 2 (two) times daily as needed (back pain).   gabapentin (NEURONTIN) 600 MG tablet Take 600 mg by mouth 2 (two) times daily.   isosorbide mononitrate  (IMDUR) 120 MG 24 hr tablet Take 1 tablet (120 mg total) by mouth daily.   latanoprost (XALATAN) 0.005 % ophthalmic solution Place 1 drop into both eyes at bedtime.   loratadine-pseudoephedrine (CLARITIN-D 24-HOUR) 10-240 MG 24 hr tablet Take 1 tablet by mouth daily as needed for allergies.   Multiple Vitamins-Minerals (CENTRUM SILVER PO) Take by mouth.   nitroGLYCERIN (NITROSTAT) 0.4 MG SL tablet Place 1 tablet (0.4 mg total) under the tongue every 5 (five) minutes as needed for chest pain (max 3 doses).   omeprazole (PRILOSEC) 20 MG capsule Take 20 mg by mouth daily.   polyethylene glycol powder (GLYCOLAX/MIRALAX) 17 GM/SCOOP powder Take 17 g by mouth daily.   pravastatin (PRAVACHOL) 40 MG tablet TAKE 1 TABLET(40 MG) BY MOUTH  EVERY EVENING   predniSONE (DELTASONE) 5 MG tablet Take 5 mg by mouth daily.   TAMSULOSIN HCL PO Take 10 mg by mouth daily.   traMADol (ULTRAM) 50 MG tablet Take 1 tablet (50 mg total) by mouth every 12 (twelve) hours as needed.        Allergies:   Topiramate, Atenolol, Lisinopril, and Tramadol-acetaminophen    Social History         Socioeconomic History   Marital status: Widowed      Spouse name: Not on file   Number of children: Not on file   Years of education: Not on file   Highest education level: 12th grade  Occupational History   Occupation: Retired  Tobacco Use   Smoking status: Former      Packs/day: 0.50      Years: 52.00      Total pack years: 26.00      Types: Cigarettes      Quit date: 05/26/2011      Years since quitting: 10.8      Passive exposure: Past   Smokeless tobacco: Never  Vaping Use   Vaping Use: Never used  Substance and Sexual Activity   Alcohol use: No   Drug use: No   Sexual activity: Not on file  Other Topics Concern   Not on file  Social History Narrative    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  Lives in [place] by himself.         Patient writes with his right hand, though uses his left hand for every thing  else. He lives alone in a one level home. He drinks 3 cups of coffee a day and 3 7-8 oz sodas a day. He does not exercise.    Social Determinants of Health        Financial Resource Strain: Low Risk  (06/11/2021)    Overall Financial Resource Strain (CARDIA)     Difficulty of Paying Living Expenses: Not hard at all  Food Insecurity: No Food Insecurity (05/29/2021)    Hunger Vital Sign     Worried About Running Out of Food in the Last Year: Never true     Ran Out of Food in the Last Year: Never true  Transportation Needs: No Transportation Needs (06/11/2021)    PRAPARE - Therapist, art (Medical): No     Lack of Transportation (Non-Medical): No  Physical Activity: Inactive (05/29/2021)    Exercise Vital Sign     Days of Exercise per Week: 0 days     Minutes of Exercise per Session: 0 min  Stress: No Stress Concern Present (05/29/2021)    [employer] of Occupational Health - Occupational Stress Questionnaire     Feeling of Stress : Not at all  Social Connections: Not on file      Family History: The patient's family history includes Breast cancer in his mother; COPD in his father; Cancer (age of onset: 13) in his brother; Cancer (age of onset: 50) in his brother; Hyperlipidemia in his father and mother; Hypertension in his brother, daughter, father, mother, sister, and son. There is no history of Colon cancer.   ROS:   Please see the history of present illness.    All other systems reviewed and are negative.   EKGs/Labs/Other Studies Reviewed:     The following studies were reviewed today:     September 23, 2020 echo EF 65 RV normal No MR Trivial AI  Recent Labs: 06/26/2021: B Natriuretic Peptide 64.3; Magnesium 1.9 03/03/2022: ALT 12; BUN 9; Creatinine, Ser 1.03; Hemoglobin 9.8; Platelets 221; Potassium 3.5; Sodium 143    Recent Lipid Panel Labs (Brief)          Component Value Date/Time    CHOL 140 02/17/2022 1532    TRIG 205 (H) 02/17/2022  1532    HDL 40 02/17/2022 1532    CHOLHDL 3.5 02/17/2022 1532    CHOLHDL 5 02/06/2013 0741    VLDL 24.4 02/06/2013 0741    LDLCALC 66 02/17/2022 1532        Physical Exam:     VS:  BP 148/79   Pulse 60   Ht 5\' 9"  (1.753 m)   Wt 240 lb 9.6 oz (109.1 kg)   SpO2 95%   BMI 35.53 kg/m         Wt Readings from Last 3 Encounters:  03/22/22 240 lb 9.6 oz (109.1 kg)  03/17/22 240 lb (108.9 kg)  03/11/22 239 lb (108.4 kg)      GEN: Well nourished, well developed in no acute distress HEENT: Normal NECK: No JVD; No carotid bruits LYMPHATICS: No lymphadenopathy CARDIAC: RRR, no murmurs, rubs, gallops; Prepectoral pocket well healed. RESPIRATORY:  Clear to auscultation without rales, wheezing or rhonchi  ABDOMEN: Soft, non-tender, non-distended MUSCULOSKELETAL:  No edema; No deformity  SKIN: Warm and dry NEUROLOGIC:  Alert and oriented x 3 PSYCHIATRIC:  Normal affect          Assessment ASSESSMENT:     1. PAF (paroxysmal atrial fibrillation) (HCC)   2. Complete heart block (HCC)   3. Pacemaker   4. Bloody stool     PLAN:     In order of problems listed above:   #Paroxysmal atrial fibrillation Intolerant of anticoagulation long-term given history of GI bleeding.  Discussed role of left atrial appendage occlusion and stroke risk mitigation and he wishes to proceed with evaluation.   -----------------------   I have seen Joel [name] in the office today who is being considered for a Watchman left atrial appendage closure device. I believe they will benefit from this procedure given their history of atrial fibrillation, CHA2DS2-VASc score of 5 and unadjusted ischemic stroke rate of 7.2% per year. Unfortunately, the patient is not felt to be a long term anticoagulation candidate secondary to history of GI bleeding. The patient's chart has been reviewed and I feel that they would be a candidate for short term oral anticoagulation after Watchman implant.    It is my belief  that after undergoing a LAA closure procedure, Joel [name] will not need long term anticoagulation which eliminates anticoagulation side effects and major bleeding risk.    Procedural risks for the Watchman implant have been reviewed with the patient including a 0.5% risk of stroke, <1% risk of perforation and <1% risk of device embolization. Other risks include bleeding, vascular damage, tamponade, worsening renal function, and death. The patient understands these risk and wishes to proceed.       The published clinical data on the safety and effectiveness of WATCHMAN include but are not limited to the following: - Holmes DR, Everlene Farrier, Sick P et al. for the PROTECT AF Investigators. Percutaneous closure of the left atrial appendage versus warfarin therapy for prevention of stroke in patients with atrial fibrillation: a randomised non-inferiority trial. Lancet 2009; 374: 534-42. Everlene Farrier, Doshi SK, Isa Rankin D et al. on behalf of the PROTECT AF Investigators. Percutaneous Left  Atrial Appendage Closure for Stroke Prophylaxis in Patients With Atrial Fibrillation 2.3-Year Follow-up of the PROTECT AF (Watchman Left Atrial Appendage System for Embolic Protection in Patients With Atrial Fibrillation) Trial. Circulation 2013; 127:720-729. - Alli O, Doshi S,  Kar S, Reddy VY, Sievert H et al. Quality of Life Assessment in the Randomized PROTECT AF (Percutaneous Closure of the Left Atrial Appendage Versus Warfarin Therapy for Prevention of Stroke in Patients With Atrial Fibrillation) Trial of Patients at Risk for Stroke With Nonvalvular Atrial Fibrillation. J Am Coll Cardiol 2013; 61:1790-8. Aline August DR, Mia Creek, Price M, Whisenant B, Sievert H, Doshi S, Huber K, Reddy V. Prospective randomized evaluation of the Watchman left atrial appendage Device in patients with atrial fibrillation versus long-term warfarin therapy; the PREVAIL trial. Journal of the [employer] of Cardiology, Vol. 4, No. 1,  2014, 1-11. - Kar S, Doshi SK, Sadhu A, Horton R, Osorio J et al. Primary outcome evaluation of a next-generation left atrial appendage closure device: results from the PINNACLE FLX trial. Circulation 2021;143(18)1754-1762.      After today's visit with the patient which was dedicated solely for shared decision making visit regarding LAA closure device, the patient decided to proceed with the LAA appendage closure procedure scheduled to be done in the near future at [facility].     No CT will be needed given a September 30 2021 CT of the chest.  I will send the study for reprocessing to see if the anatomy is amenable to left atrial appendage closure.   We will plan to do his implant on DAPT rather than a blood thinner to avoid the risk of GI bleeding which occurred in the past when he was on a NOAC.  I will ask him to continue his aspirin 81 mg by mouth once daily until device implant.  On the day of implant, we will load him with Plavix 300 mg by mouth x 1 and he will continue 75 mg by mouth daily thereafter to complete 6 months of total post implant therapy.       HAS-BLED score 4 Hypertension Yes  Abnormal renal and liver function (Dialysis, transplant, Cr >2.26 mg/dL /Cirrhosis or Bilirubin >2x Normal or AST/ALT/AP >3x Normal) No  Stroke No  Bleeding Yes  Labile INR (Unstable/high INR) No  Elderly (>65) Yes  Drugs or alcohol (? 8 drinks/week, anti-plt or NSAID) Yes    CHA2DS2-VASc Score = 5  The patient's score is based upon: CHF History: 0 HTN History: 1 Diabetes History: 1 Stroke History: 0 Vascular Disease History: 1 Age Score: 2 Gender Score: 0     Presents for LAAO today. Procedure reviewed.      Signed, Rossie Muskrat. Lalla Brothers, MD, [facility], [facility] 09/02/2022 Electrophysiology [facility]

## 2022-09-02 NOTE — Transfer of Care (Signed)
Immediate Anesthesia Transfer of Care Note  Patient: GAEL HINSDALE  Procedure(s) Performed: LEFT ATRIAL APPENDAGE OCCLUSION TRANSESOPHAGEAL ECHOCARDIOGRAM  Patient Location: Cath Lab  Anesthesia Type:General  Level of Consciousness: awake  Airway & Oxygen Therapy: Patient Spontanous Breathing and Patient connected to nasal cannula oxygen  Post-op Assessment: Report given to RN  Post vital signs: Reviewed and stable  Last Vitals:  Vitals Value Taken Time  BP 156/75   Temp    Pulse 60 09/02/22 1432  Resp 11 09/02/22 1432  SpO2 99 % 09/02/22 1432  Vitals shown include unvalidated device data.  Last Pain:  Vitals:   09/02/22 1200  PainSc: 0-No pain         Complications: There were no known notable events for this encounter.

## 2022-09-02 NOTE — Anesthesia Procedure Notes (Signed)
Procedure Name: Intubation Date/Time: 09/02/2022 1:24 PM  Performed by: Cheree Ditto, CRNAPre-anesthesia Checklist: Patient identified, Emergency Drugs available, Suction available and Patient being monitored Patient Re-evaluated:Patient Re-evaluated prior to induction Oxygen Delivery Method: Circle system utilized Preoxygenation: Pre-oxygenation with 100% oxygen Induction Type: IV induction Ventilation: Mask ventilation without difficulty and Oral airway inserted - appropriate to patient size Laryngoscope Size: Mac and 3 Grade View: Grade I Tube type: Oral Tube size: 7.5 mm Number of attempts: 1 Airway Equipment and Method: Stylet and Oral airway Placement Confirmation: ETT inserted through vocal cords under direct vision, positive ETCO2 and breath sounds checked- equal and bilateral Secured at: 22 cm Tube secured with: Tape Dental Injury: Teeth and Oropharynx as per pre-operative assessment  Comments: Intubation by Dennison Nancy

## 2022-09-02 NOTE — Anesthesia Postprocedure Evaluation (Signed)
Anesthesia Post Note  Patient: Joel [name]  Procedure(s) Performed: LEFT ATRIAL APPENDAGE OCCLUSION TRANSESOPHAGEAL ECHOCARDIOGRAM     Patient location during evaluation: PACU Anesthesia Type: General Level of consciousness: awake and alert, oriented and patient cooperative Pain management: pain level controlled Vital Signs Assessment: post-procedure vital signs reviewed and stable Respiratory status: spontaneous breathing, nonlabored ventilation, respiratory function stable and patient connected to nasal cannula oxygen Cardiovascular status: blood pressure returned to baseline and stable Postop Assessment: no apparent nausea or vomiting Anesthetic complications: no   There were no known notable events for this encounter.  Last Vitals:  Vitals:   09/02/22 1435 09/02/22 1440  BP: (!) 142/66   Pulse: 61 60  Resp: 14 16  Temp:    SpO2: 98% 98%    Last Pain:  Vitals:   09/02/22 1427  TempSrc: Temporal  PainSc: 0-No pain                 Hamdi Kley,E. Naythan Douthit

## 2022-09-02 NOTE — Progress Notes (Signed)
Dr Lalla Brothers in to see client earlier and ok to d/c home; client up and walked and tolerated well; right groin stable, no bleeding or hematoma

## 2022-09-03 ENCOUNTER — Telehealth: Payer: Self-pay | Admitting: Cardiology

## 2022-09-03 ENCOUNTER — Encounter (HOSPITAL_COMMUNITY): Payer: Self-pay | Admitting: Cardiology

## 2022-09-03 MED FILL — Fentanyl Citrate Preservative Free (PF) Inj 100 MCG/2ML: INTRAMUSCULAR | Qty: 2 | Status: AC

## 2022-09-03 NOTE — Telephone Encounter (Signed)
  [facility]   Watchman Team  Patient was an aborted Watchman case 09/02/22 due to poor peripheral access. Doing well today however reported one episode of leg pain while attempting to sleep last night however this has not recurred. He will contact our team if symptoms return and/worsen.   Contacted the patient regarding discharge from [facility] on 09/02/22  The patient understands to follow up with Dr. Ladona Ridgel on 6/24.   The patient reports groin site looks stable with no bleeding, redness, ir swelling.   The patient understands to call with any questions or concerns prior to scheduled visit.

## 2022-09-10 ENCOUNTER — Ambulatory Visit (INDEPENDENT_AMBULATORY_CARE_PROVIDER_SITE_OTHER): Payer: Medicare HMO

## 2022-09-10 DIAGNOSIS — I442 Atrioventricular block, complete: Secondary | ICD-10-CM

## 2022-09-10 LAB — CUP PACEART REMOTE DEVICE CHECK
Battery Remaining Longevity: 89 mo
Battery Remaining Percentage: 91 %
Battery Voltage: 3.01 V
Brady Statistic AP VP Percent: 55 %
Brady Statistic AP VS Percent: 1 %
Brady Statistic AS VP Percent: 44 %
Brady Statistic AS VS Percent: 1 %
Brady Statistic RA Percent Paced: 54 %
Brady Statistic RV Percent Paced: 99 %
Date Time Interrogation Session: 20240531051342
Implantable Lead Connection Status: 753985
Implantable Lead Connection Status: 753985
Implantable Lead Implant Date: 20230525
Implantable Lead Implant Date: 20230525
Implantable Lead Location: 753859
Implantable Lead Location: 753860
Implantable Pulse Generator Implant Date: 20230525
Lead Channel Impedance Value: 430 Ohm
Lead Channel Impedance Value: 440 Ohm
Lead Channel Pacing Threshold Amplitude: 0.5 V
Lead Channel Pacing Threshold Amplitude: 0.75 V
Lead Channel Pacing Threshold Pulse Width: 0.5 ms
Lead Channel Pacing Threshold Pulse Width: 0.5 ms
Lead Channel Sensing Intrinsic Amplitude: 12 mV
Lead Channel Sensing Intrinsic Amplitude: 5 mV
Lead Channel Setting Pacing Amplitude: 2 V
Lead Channel Setting Pacing Amplitude: 2.5 V
Lead Channel Setting Pacing Pulse Width: 0.5 ms
Lead Channel Setting Sensing Sensitivity: 4 mV
Pulse Gen Model: 2272
Pulse Gen Serial Number: 8084442

## 2022-09-16 ENCOUNTER — Other Ambulatory Visit: Payer: Medicare HMO

## 2022-09-17 NOTE — Telephone Encounter (Signed)
Pt goes to the [place] for Prostate cancer treatment. He has had cancer 2 years He goes every 8 weeks for infusion for treatment. His last infusion was last week. No surgery

## 2022-09-17 NOTE — Telephone Encounter (Signed)
I received a form from a cancer registry program through [facility].  I do not think that I have seen this form on him before.  Since some of his prior cancer records are in Care Everywhere through [facility], it is not as easy to filter through prior records to find information  Is he currently still seen a cancer doctor oncologist either at [facility] or somewhere else?   Is he actively on treatment for cancer?    If so what is the current treatment plan in terms of how often is he getting treatments, where is he getting the treatment?  The form has some very specific questions about this.  Please find out the answers to the questions above

## 2022-09-17 NOTE — Telephone Encounter (Signed)
Left message for pt to call me back 

## 2022-09-20 ENCOUNTER — Telehealth (INDEPENDENT_AMBULATORY_CARE_PROVIDER_SITE_OTHER): Payer: Medicare HMO | Admitting: Medical

## 2022-09-20 DIAGNOSIS — U071 COVID-19: Secondary | ICD-10-CM

## 2022-09-20 DIAGNOSIS — R051 Acute cough: Secondary | ICD-10-CM | POA: Diagnosis not present

## 2022-09-20 MED ORDER — NIRMATRELVIR/RITONAVIR (PAXLOVID)TABLET: 3.0000 | ORAL_TABLET | Freq: Two times a day (BID) | ORAL | 0 refills | Status: AC

## 2022-09-20 NOTE — Progress Notes (Signed)
Subjective:     [name], male   DOB: 1940/05/24, 82 y.o.   MRN: [id number]  This visit type was conducted due to national recommendations for restrictions regarding the COVID-19 Pandemic (e.g. social distancing) in an effort to limit this patient's exposure and mitigate transmission in our community.  Due to their co-morbid illnesses, this patient is at least at moderate risk for complications without adequate follow up.  This format is felt to be most appropriate for this patient at this time.    Documentation for virtual audio and video telecommunications through [place] encounter:  The patient was located at home. The provider was located in the office. The patient did consent to this visit and is aware of possible charges through their insurance for this visit.  The other persons participating in this telemedicine service were none. Time spent on call was 20 minutes and in review of previous records 20 minutes total.  This virtual service is not related to other E/M service within previous 7 days.   HPI Chief Complaint  Patient presents with   Covid Positive    Positive covid- church outbreak, tested Friday. Symptoms Saturday- cough. No other symptoms. Does he need an appointment   Virtual consult for covid infection.  Did home test and he was positive 3 days ago.  He reports cough, some phlegm that is clear.  No fever, no body aches or chills, no SOB or wheezing.  Some fatigue.  No headache. No sore throat.  No nausea or vomiting but some loose stool.  Using some nothing for symptoms.  Denies every having covid.   No other aggravating or relieving factors. No other complaint.  Past Medical History:  Diagnosis Date   Alpha thalassemia trait    Atypical chest pain    CAD (coronary artery disease)    a. Multiple prior evaluations then 05/2012 s/p BMS to OM2. b. Nuc 11/2012 wnl. c. Cath 2015: patent stent, moderate LAD/RCA dz, treated medically.   Cataract     CHEST PAIN UNSPECIFIED 09/13/2008   Qualifier: Diagnosis of  By: Trevor Iha, RN, Heather     CKD (chronic kidney disease), stage II    COPD (chronic obstructive pulmonary disease) (HCC)    Cough secondary to angiotensin converting enzyme inhibitor (ACE-I)    Diabetes mellitus (HCC) 09/07/2019   Diabetes mellitus without complication (HCC)    Diverticulosis    DVT (deep venous thrombosis) (HCC)    Family history of breast cancer in first degree relative    GERD (gastroesophageal reflux disease)    Headache(784.0)    Hx: of   History of blood transfusion    "I've had 2; don't know what it was related to" (11/09/2012)   HLD (hyperlipidemia)    HTN (hypertension)    Iron deficiency anemia    Lymphoproliferative disorder, low grade B cell (HCC) 05/21/2020   Microcytic anemia    PAF (paroxysmal atrial fibrillation) (HCC)    Peripheral vascular disease (HCC)    a. L pop-tibial bypass 2011, followed by [facility].   Personal history of prostate cancer    Presence of permanent cardiac pacemaker    Rotator cuff injury    "left arm; never repaired" (11/09/2012)   Current Outpatient Medications on File Prior to Visit  Medication Sig Dispense Refill   abiraterone acetate (ZYTIGA) 250 MG tablet Take 250 mg by mouth in the morning.     albuterol (VENTOLIN HFA) 108 (90 Base) MCG/ACT inhaler Inhale 2 puffs into the lungs  every 6 (six) hours as needed for wheezing or shortness of breath. 54 g 0   amLODipine (NORVASC) 10 MG tablet TAKE 1 TABLET(10 MG) BY MOUTH DAILY 90 tablet 3   aspirin EC 81 MG tablet Take 1 tablet (81 mg total) by mouth daily. Swallow whole. 90 tablet 3   brimonidine (ALPHAGAN) 0.2 % ophthalmic solution Place 1 drop into both eyes 2 (two) times daily.     diclofenac Sodium (VOLTAREN) 1 % GEL Apply 2 g topically 2 (two) times daily as needed (back pain).     ferrous gluconate (FERGON) 324 MG tablet Take 1 tablet (324 mg total) by mouth daily with breakfast. 30 tablet 2   gabapentin (NEURONTIN)  600 MG tablet Take 600 mg by mouth 2 (two) times daily.     isosorbide mononitrate (IMDUR) 120 MG 24 hr tablet Take 1 tablet (120 mg total) by mouth daily. 90 tablet 2   latanoprost (XALATAN) 0.005 % ophthalmic solution Place 1 drop into both eyes at bedtime.     nitroGLYCERIN (NITROSTAT) 0.4 MG SL tablet Place 1 tablet (0.4 mg total) under the tongue every 5 (five) minutes as needed for chest pain (max 3 doses). 75 tablet 2   omeprazole (PRILOSEC) 20 MG capsule Take 20 mg by mouth daily.     polyethylene glycol powder (GLYCOLAX/MIRALAX) 17 GM/SCOOP powder Take 17 g by mouth daily. 289 g 0   pravastatin (PRAVACHOL) 40 MG tablet TAKE 1 TABLET(40 MG) BY MOUTH EVERY EVENING 90 tablet 2   predniSONE (DELTASONE) 5 MG tablet Take 5 mg by mouth in the morning.     tamsulosin (FLOMAX) 0.4 MG CAPS capsule Take 0.4 mg by mouth at bedtime.     traMADol (ULTRAM) 50 MG tablet TAKE 1 TABLET(50 MG) BY MOUTH EVERY 12 HOURS AS NEEDED 30 tablet 0   Accu-Chek FastClix Lancets MISC Test once daily 102 each 12   No current facility-administered medications on file prior to visit.     Review of Systems As in subjective    Objective:   Physical Exam Due to coronavirus pandemic stay at home measures, patient visit was virtual and they were not examined in person.   There were no vitals taken for this visit.  Gen: wd, wn, nad No wheezing or labored breathing      Assessment:     Encounter Diagnoses  Name Primary?   COVID Yes   Acute cough        Plan:     Covid infection, covid illness general recommendations:  I recommend you rest, hydrate well with water and clear fluids throughout the day.  If you feel dry in the mouth, tongue or feel that you are urinating as much as usual, then increase hydration.  You urine should be like yellow to clear, not dark yellow or darker.     Pain, body aches, or fever: You can use Tylenol /Acetaminophen 325mg  over the counter for pain or fever, every 4-6  hours  Cough and congestion: You can use over the counter Mucinex DM or Coricidin HBP for cough and congestion   Shortness of breath or wheezing: Use your inhaler as needed.  Nausea: You can use over the counter Emetrol for nausea.    Antiviral medication: Begin medication Paxlovid to help reduce your risk of hospitalization or severe illness.  If medicaiton is not available, too expensive or not covered by insurance, then call us back.   Other supportive measures: I recommend extra vitamins to help  your body fight the illness.   Consider over the counter vitamin pack such as EmergenC Immune Plus which contains extra vitamin C, vitamin D and zinc.     In the next few days, if you are having trouble breathing, if you are very weak, have high fever 103 or higher consistently despite Tylenol, or uncontrollable nausea and vomiting, then call or go to the emergency department.    If you have other questions or have other symptoms or questions you are concerned about then please make a virtual visit   Covid symptoms such as fatigue and cough can linger over 2 weeks, even after the initial fever, aches, chills, and other initial symptoms.   Self Quarantine: The CDC, Centers for Disease Control has recommended a self quarantine of 5 days from the start of your illness until you are symptom-free including at least 24 hours of no symptoms including no fever, no shortness of breath, and no body aches and chills, by day 5 before returning to work or general contact with the public.  What does self quarantine mean: avoiding contact with people as much as possible.   Particularly in your house, isolate your self from others in a separate room, wear a mask when possible in the room, particularly if coughing a lot.   Have others bring food, water, medications, etc., to your door, but avoid direct contact with your household contacts during this time to avoid spreading the infection to them.   If you  have a separate bathroom and living quarters during the next 2 weeks away from others, that would be preferable.    If you can't completely isolate, then wear a mask, wash hands frequently with soap and water for at least 15 seconds, minimize close contact with others, and have a friend or family member check regularly from a distance to make sure you are not getting seriously worse.     You should not be going out in public, should not be going to stores, to work or other public places until all your symptoms have resolved and at least 5 days + 24 hours of no symptoms at all have transpired.   Ideally you should avoid contact with others for a full 5 days if possible.  One of the goals is to limit spread to high risk people; people that are older and elderly, people with multiple health issues like diabetes, heart disease, lung disease, and anybody that has weakened immune systems such as people with cancer or on immunosuppressive therapy.     Maris was seen today for covid positive.  Diagnoses and all orders for this visit:  COVID  Acute cough  Other orders -     nirmatrelvir/ritonavir (PAXLOVID) 20 x 150 MG & 10 x 100MG  TABS; Take 3 tablets by mouth 2 (two) times daily for 5 days. GFR is normal    F/u prn

## 2022-09-28 NOTE — Progress Notes (Signed)
Remote pacemaker transmission.   

## 2022-09-29 ENCOUNTER — Ambulatory Visit
Admission: RE | Admit: 2022-09-29 | Discharge: 2022-09-29 | Disposition: A | Discharge status: Home / Self Care | Payer: Medicare HMO | Source: Ambulatory Visit | Attending: Orthopaedic Surgery | Admitting: Orthopaedic Surgery

## 2022-09-29 DIAGNOSIS — M5416 Radiculopathy, lumbar region: Secondary | ICD-10-CM

## 2022-09-29 DIAGNOSIS — M5116 Intervertebral disc disorders with radiculopathy, lumbar region: Secondary | ICD-10-CM

## 2022-09-29 DIAGNOSIS — M4326 Fusion of spine, lumbar region: Secondary | ICD-10-CM

## 2022-10-04 ENCOUNTER — Ambulatory Visit: Payer: Medicare HMO | Admitting: Internal Medicine

## 2022-10-06 ENCOUNTER — Telehealth: Payer: Self-pay | Admitting: Cardiology

## 2022-10-06 NOTE — Telephone Encounter (Signed)
Patient returned call and reports that he has been doing well. No further lower leg pain since the last we spoke. He has been tolerating ASA monotherapy without issues of recurrent GI bleeding. He was rescheduled with Dr. Ladona Ridgel for 01/2023 for regular follow up.   Georgie Chard NP-C Structural Heart Team  Pager: [phone] Phone: [phone]

## 2022-10-06 NOTE — Telephone Encounter (Signed)
Pt presented to [facility] for LAAO 5/23 however case was aborted due to poor vascular access. He was scheduled to see Dr. Ladona Ridgel for follow up 6/24 but this appointment was cancelled. I attempted to call the patient for one month s/p aborted [facility] follow up with no answer. Left voice message to return call.   Georgie Chard NP-C Structural Heart Team  Pager: [phone] Phone: [phone]

## 2022-10-07 ENCOUNTER — Encounter: Payer: Self-pay | Admitting: Medical

## 2022-10-07 ENCOUNTER — Ambulatory Visit (INDEPENDENT_AMBULATORY_CARE_PROVIDER_SITE_OTHER): Payer: Medicare HMO | Admitting: Medical

## 2022-10-07 VITALS — BP 118/60 | HR 63 | Temp 97.7°F | Resp 20 | Wt 229.8 lb

## 2022-10-07 DIAGNOSIS — R059 Cough, unspecified: Secondary | ICD-10-CM

## 2022-10-07 DIAGNOSIS — B356 Tinea cruris: Secondary | ICD-10-CM

## 2022-10-07 DIAGNOSIS — R0989 Other specified symptoms and signs involving the circulatory and respiratory systems: Secondary | ICD-10-CM

## 2022-10-07 MED ORDER — TERBINAFINE HCL 1 % EX CREA: 1.0000 | TOPICAL_CREAM | Freq: Two times a day (BID) | CUTANEOUS | 0 refills | Status: DC

## 2022-10-07 MED ORDER — BENZONATATE 200 MG PO CAPS: 200.0000 mg | ORAL_CAPSULE | Freq: Three times a day (TID) | ORAL | 0 refills | Status: DC | PRN

## 2022-10-07 NOTE — Patient Instructions (Signed)
Recommendations: Stop Claritin for now and begin Zyrtec allergy tablet for the next week or 2 and see if this helps the drainage and cough Consider Flonase nasal spray over the counter for allergy symptoms Increase water intake to 60-80 ounces daily You can use Tessalon Perles for cough as needed Begin Lamisil cream for jock itch If not much improved within the next 7-10 days, then let me know

## 2022-10-07 NOTE — Progress Notes (Signed)
Subjective:  Joel [name] is a 82 y.o. male who presents for Chief Complaint  Patient presents with   Cough    Complains of productive cough with clear mucus x 2 weeks. Also reports runny nose. Has tried using cough drops and Claritin.      Here for cough x 2 weeks.  He notes cough, some runny nose, some clear phlegm.  Doesn't feel congested in head.  No fever, no sore throat, no wheezing or SOB.   No sick contacts.   Using Claritin daily.  He also notes some jock itch they just 3 days ago, mild just on the testicles.  No urinary complaints, no genital complaints otherwise.  No concern for infection or sexually transmitted disease.  Lives alone.  No other aggravating or relieving factors.    No other c/o.  Past Medical History:  Diagnosis Date   Alpha thalassemia trait    Atypical chest pain    CAD (coronary artery disease)    a. Multiple prior evaluations then 05/2012 s/p BMS to OM2. b. Nuc 11/2012 wnl. c. Cath 2015: patent stent, moderate LAD/RCA dz, treated medically.   Cataract    CHEST PAIN UNSPECIFIED 09/13/2008   Qualifier: Diagnosis of  By: Trevor Iha, RN, Heather     CKD (chronic kidney disease), stage II    COPD (chronic obstructive pulmonary disease) (HCC)    Cough secondary to angiotensin converting enzyme inhibitor (ACE-I)    Diabetes mellitus (HCC) 09/07/2019   Diabetes mellitus without complication (HCC)    Diverticulosis    DVT (deep venous thrombosis) (HCC)    Family history of breast cancer in first degree relative    GERD (gastroesophageal reflux disease)    Headache(784.0)    Hx: of   History of blood transfusion    "I've had 2; don't know what it was related to" (11/09/2012)   HLD (hyperlipidemia)    HTN (hypertension)    Iron deficiency anemia    Lymphoproliferative disorder, low grade B cell (HCC) 05/21/2020   Microcytic anemia    PAF (paroxysmal atrial fibrillation) (HCC)    Peripheral vascular disease (HCC)    a. L pop-tibial bypass 2011, followed by  [facility].   Personal history of prostate cancer    Presence of permanent cardiac pacemaker    Rotator cuff injury    "left arm; never repaired" (11/09/2012)   Current Outpatient Medications on File Prior to Visit  Medication Sig Dispense Refill   abiraterone acetate (ZYTIGA) 250 MG tablet Take 250 mg by mouth in the morning.     Accu-Chek FastClix Lancets MISC Test once daily 102 each 12   albuterol (VENTOLIN HFA) 108 (90 Base) MCG/ACT inhaler Inhale 2 puffs into the lungs every 6 (six) hours as needed for wheezing or shortness of breath. 54 g 0   amLODipine (NORVASC) 10 MG tablet TAKE 1 TABLET(10 MG) BY MOUTH DAILY 90 tablet 3   aspirin EC 81 MG tablet Take 1 tablet (81 mg total) by mouth daily. Swallow whole. 90 tablet 3   diclofenac Sodium (VOLTAREN) 1 % GEL Apply 2 g topically 2 (two) times daily as needed (back pain).     ferrous gluconate (FERGON) 324 MG tablet Take 1 tablet (324 mg total) by mouth daily with breakfast. 30 tablet 2   gabapentin (NEURONTIN) 600 MG tablet Take 600 mg by mouth 2 (two) times daily.     isosorbide mononitrate (IMDUR) 120 MG 24 hr tablet Take 1 tablet (120 mg total) by mouth daily.  90 tablet 2   latanoprost (XALATAN) 0.005 % ophthalmic solution Place 1 drop into both eyes at bedtime.     nitroGLYCERIN (NITROSTAT) 0.4 MG SL tablet Place 1 tablet (0.4 mg total) under the tongue every 5 (five) minutes as needed for chest pain (max 3 doses). 75 tablet 2   omeprazole (PRILOSEC) 20 MG capsule Take 20 mg by mouth daily.     polyethylene glycol powder (GLYCOLAX/MIRALAX) 17 GM/SCOOP powder Take 17 g by mouth daily. 289 g 0   pravastatin (PRAVACHOL) 40 MG tablet TAKE 1 TABLET(40 MG) BY MOUTH EVERY EVENING 90 tablet 2   predniSONE (DELTASONE) 5 MG tablet Take 5 mg by mouth in the morning.     tamsulosin (FLOMAX) 0.4 MG CAPS capsule Take 0.4 mg by mouth at bedtime.     traMADol (ULTRAM) 50 MG tablet TAKE 1 TABLET(50 MG) BY MOUTH EVERY 12 HOURS AS NEEDED 30 tablet 0    brimonidine (ALPHAGAN) 0.2 % ophthalmic solution Place 1 drop into both eyes 2 (two) times daily.     No current facility-administered medications on file prior to visit.    The following portions of the patient's history were reviewed and updated as appropriate: allergies, current medications, past family history, past medical history, past social history, past surgical history and problem list.  ROS Otherwise as in subjective above    Objective: BP 118/60   Pulse 63   Temp 97.7 F (36.5 C) (Oral)   Resp 20   Wt 229 lb 12.8 oz (104.2 kg)   SpO2 96%   BMI 33.94 kg/m   General appearance: alert, no distress, well developed, well nourished, well appearing HEENT: normocephalic, sclerae anicteric, conjunctiva pink and moist, TMs with decreased light reflex, but no erythema or bulging, nares patent, no discharge or erythema, pharynx normal Oral cavity: Somewhat dry MM, no lesions Neck: supple, no lymphadenopathy, no thyromegaly, no masses Heart: RRR, normal S1, S2, no murmurs Lungs: CTA bilaterally, no wheezes, rhonchi, or rales     Assessment: Encounter Diagnoses  Name Primary?   Cough, unspecified type Yes   Runny nose    Tinea cruris      Plan: Discussed symptoms, concerns.  Respiratory symptoms suggest allergy related postnasal drainage and cough.  Recommendations: Stop Claritin for now and begin Zyrtec allergy tablet for the next week or 2 and see if this helps the drainage and cough Consider Flonase nasal spray over the counter for allergy symptoms Increase water intake to 60-80 ounces daily You can use Tessalon Perles for cough as needed   Regarding rash, tinea cruris  begin Lamisil cream for jock itch If not much improved within the next 7-10 days, then let me know    Boy was seen today for cough.  Diagnoses and all orders for this visit:  Cough, unspecified type  Runny nose  Tinea cruris  Other orders -     terbinafine (LAMISIL AT) 1 % cream;  Apply 1 Application topically 2 (two) times daily. -     benzonatate (TESSALON) 200 MG capsule; Take 1 capsule (200 mg total) by mouth 3 (three) times daily as needed for cough.    Follow up: prn

## 2022-10-11 NOTE — Telephone Encounter (Signed)
The patient reports he is doing well. Confirmed upcoming appointment with Dr. Ladona Ridgel in October.  He understands to call if he has questions or concerns prior to that time. He was grateful for call and agreed with plan.

## 2022-10-31 ENCOUNTER — Emergency Department (HOSPITAL_COMMUNITY): Payer: Medicare HMO

## 2022-10-31 ENCOUNTER — Emergency Department (HOSPITAL_COMMUNITY)
Admission: EM | Admit: 2022-10-31 | Discharge: 2022-10-31 | Disposition: A | Discharge status: Home / Self Care | Payer: Medicare HMO | Attending: Emergency Medicine | Admitting: Emergency Medicine

## 2022-10-31 DIAGNOSIS — I2510 Atherosclerotic heart disease of native coronary artery without angina pectoris: Secondary | ICD-10-CM | POA: Diagnosis not present

## 2022-10-31 DIAGNOSIS — I951 Orthostatic hypotension: Secondary | ICD-10-CM | POA: Insufficient documentation

## 2022-10-31 DIAGNOSIS — I129 Hypertensive chronic kidney disease with stage 1 through stage 4 chronic kidney disease, or unspecified chronic kidney disease: Secondary | ICD-10-CM | POA: Insufficient documentation

## 2022-10-31 DIAGNOSIS — I480 Paroxysmal atrial fibrillation: Secondary | ICD-10-CM | POA: Diagnosis not present

## 2022-10-31 DIAGNOSIS — J449 Chronic obstructive pulmonary disease, unspecified: Secondary | ICD-10-CM | POA: Insufficient documentation

## 2022-10-31 DIAGNOSIS — E860 Dehydration: Secondary | ICD-10-CM | POA: Insufficient documentation

## 2022-10-31 DIAGNOSIS — Z79899 Other long term (current) drug therapy: Secondary | ICD-10-CM | POA: Insufficient documentation

## 2022-10-31 DIAGNOSIS — Z7982 Long term (current) use of aspirin: Secondary | ICD-10-CM | POA: Diagnosis not present

## 2022-10-31 DIAGNOSIS — R55 Syncope and collapse: Secondary | ICD-10-CM | POA: Diagnosis present

## 2022-10-31 DIAGNOSIS — N189 Chronic kidney disease, unspecified: Secondary | ICD-10-CM | POA: Insufficient documentation

## 2022-10-31 LAB — URINALYSIS, ROUTINE W REFLEX MICROSCOPIC
Bilirubin Urine: NEGATIVE
Glucose, UA: NEGATIVE mg/dL
Hgb urine dipstick: NEGATIVE
Ketones, ur: NEGATIVE mg/dL
Leukocytes,Ua: NEGATIVE
Nitrite: NEGATIVE
Protein, ur: NEGATIVE mg/dL
Specific Gravity, Urine: 1.004 — ABNORMAL LOW (ref 1.005–1.030)
pH: 6 (ref 5.0–8.0)

## 2022-10-31 LAB — BASIC METABOLIC PANEL
Anion gap: 16 — ABNORMAL HIGH (ref 5–15)
BUN: 17 mg/dL (ref 8–23)
CO2: 21 mmol/L — ABNORMAL LOW (ref 22–32)
Calcium: 9 mg/dL (ref 8.9–10.3)
Chloride: 100 mmol/L (ref 98–111)
Creatinine, Ser: 1.32 mg/dL — ABNORMAL HIGH (ref 0.61–1.24)
GFR, Estimated: 54 mL/min — ABNORMAL LOW (ref >60)
Glucose, Bld: 128 mg/dL — ABNORMAL HIGH (ref 70–99)
Potassium: 4.5 mmol/L (ref 3.5–5.1)
Sodium: 137 mmol/L (ref 135–145)

## 2022-10-31 LAB — I-STAT VENOUS BLOOD GAS, ED
Acid-Base Excess: 2 mmol/L (ref 0.0–2.0)
Bicarbonate: 26.1 mmol/L (ref 20.0–28.0)
Calcium, Ion: 1.15 mmol/L (ref 1.15–1.40)
HCT: 29 % — ABNORMAL LOW (ref 39.0–52.0)
Hemoglobin: 9.9 g/dL — ABNORMAL LOW (ref 13.0–17.0)
O2 Saturation: 99 %
Potassium: 4.5 mmol/L (ref 3.5–5.1)
Sodium: 137 mmol/L (ref 135–145)
TCO2: 27 mmol/L (ref 22–32)
pCO2, Ven: 40 mmHg — ABNORMAL LOW (ref 44–60)
pH, Ven: 7.422 (ref 7.25–7.43)
pO2, Ven: 132 mmHg — ABNORMAL HIGH (ref 32–45)

## 2022-10-31 LAB — CBC WITH DIFFERENTIAL/PLATELET
Abs Immature Granulocytes: 0.11 10*3/uL — ABNORMAL HIGH (ref 0.00–0.07)
Basophils Absolute: 0 10*3/uL (ref 0.0–0.1)
Basophils Relative: 1 %
Eosinophils Absolute: 0.2 10*3/uL (ref 0.0–0.5)
Eosinophils Relative: 4 %
HCT: 30.5 % — ABNORMAL LOW (ref 39.0–52.0)
Hemoglobin: 9.2 g/dL — ABNORMAL LOW (ref 13.0–17.0)
Immature Granulocytes: 3 %
Lymphocytes Relative: 13 %
Lymphs Abs: 0.6 10*3/uL — ABNORMAL LOW (ref 0.7–4.0)
MCH: 21.3 pg — ABNORMAL LOW (ref 26.0–34.0)
MCHC: 30.2 g/dL (ref 30.0–36.0)
MCV: 70.6 fL — ABNORMAL LOW (ref 80.0–100.0)
Monocytes Absolute: 0.5 10*3/uL (ref 0.1–1.0)
Monocytes Relative: 11 %
Neutro Abs: 3.1 10*3/uL (ref 1.7–7.7)
Neutrophils Relative %: 68 %
Platelets: 195 10*3/uL (ref 150–400)
RBC: 4.32 MIL/uL (ref 4.22–5.81)
RDW: 19 % — ABNORMAL HIGH (ref 11.5–15.5)
WBC: 4.4 10*3/uL (ref 4.0–10.5)
nRBC: 0 % (ref 0.0–0.2)

## 2022-10-31 LAB — CBG MONITORING, ED: Glucose-Capillary: 118 mg/dL — ABNORMAL HIGH (ref 70–99)

## 2022-10-31 LAB — TROPONIN I (HIGH SENSITIVITY)
Troponin I (High Sensitivity): 8 ng/L (ref <18)
Troponin I (High Sensitivity): 8 ng/L (ref <18)

## 2022-10-31 MED ORDER — LACTATED RINGERS IV BOLUS
1000.0000 mL | Freq: Once | INTRAVENOUS | Status: AC
  Administered 2022-10-31: 1000 mL via INTRAVENOUS

## 2022-10-31 MED ORDER — SODIUM CHLORIDE 0.9 % IV BOLUS
500.0000 mL | Freq: Once | INTRAVENOUS | Status: AC
  Administered 2022-10-31: 500 mL via INTRAVENOUS

## 2022-10-31 NOTE — ED Triage Notes (Signed)
[facility]. Pt lives at home. Pt states he was at church when he began feeling "warm" and had syncopal episode. Pt denies falling. pt stated his friends lowered him to the ground. No injuries. Pt denied hitting his head. Pt's only c/o fatigue upon arrival to ED. Pt has hx of pacemaker x3-4 months. Pt was initially hypotensive with EMS at 82 palp initial BP with EMS. EMS gvae 100cc NS bolus en route. Pt states he has not had anything to eat or drink today. Pt AAOx4 upon arrival to ED.    EMS Vitals: 110/92 60 HR 105 CBG 97% RA 20 L hand

## 2022-10-31 NOTE — Discharge Instructions (Addendum)
Thank you for letting us take care of you today.  Your workup was reassuring. Your heart enzymes, chest x-ray, and CT scan of your head were normal. Your blood work showed that you were mildly dehydrated so we gave you IV fluids to help with this.  Be sure to increase hydration at home using either water or electrolyte replacement solutions such as Powerade.  It is important to eat regular, small meals to maintain good nutrition.  Follow-up with your PCP this week for discussion of your ED visit today and reevaluation of any continued symptoms.  For recurrent episodes of passing out, chest pain, shortness of breath, severe headache, visual changes, or other new, concerning symptoms, return to the nearest ED for reevaluation.

## 2022-10-31 NOTE — ED Provider Notes (Signed)
[name] Provider Note   CSN: [id number] Arrival date & time: 10/31/22  1114     History  Chief Complaint  Patient presents with   Loss of Consciousness    Joel [name] is a 82 y.o. male with past medical history of CAD, CKD, COPD, hypertension, hyperlipidemia, iron deficiency anemia, paroxysmal A-fib, status post pacemaker placement who presents to the ED complaining of syncope.  He states that he was at church this morning when he began to feel a warm sensation throughout his body and instructed bystanders to assist him and when he tried to walk to the back of the church he had a syncopal episode.  States that bystanders were able to assist him to the ground and he did not hit his head or otherwise injure himself.  He was feeling well before going to church this morning.  No recent nausea, vomiting, diarrhea, change in p.o. intake, fever, chills, cough, congestion, or other complaints.  States that he did not have much to eat apart from the back of saltine crackers before church this morning but this is not unusual for him.  Other than the warm sensation, he denies any preceding symptoms including palpitations, chest pain, shortness of breath, headache, or visual changes.  Currently states that he feels weak but otherwise at his baseline.  Given small amount of IV fluids and route to the ED.  Initially found to be hypotensive at the scene with palpable systolic blood pressure of 82.      Home Medications Prior to Admission medications   Medication Sig Start Date End Date Taking? Authorizing Provider  abiraterone acetate (ZYTIGA) 250 MG tablet Take 250 mg by mouth in the morning. 02/17/18   [provider]  Accu-Chek FastClix Lancets MISC Test once daily 11/23/18   Henson, Vickie L, NP-C  albuterol (VENTOLIN HFA) 108 (90 Base) MCG/ACT inhaler Inhale 2 puffs into the lungs every 6 (six) hours as needed for wheezing or shortness of  breath. 08/27/22   Tysinger, Kermit Balo, PA-C  amLODipine (NORVASC) 10 MG tablet TAKE 1 TABLET(10 MG) BY MOUTH DAILY 03/22/22   Kathleene Hazel, MD  aspirin EC 81 MG tablet Take 1 tablet (81 mg total) by mouth daily. Swallow whole. 03/17/22   Kathleene Hazel, MD  benzonatate (TESSALON) 200 MG capsule Take 1 capsule (200 mg total) by mouth 3 (three) times daily as needed for cough. 10/07/22   Tysinger, Kermit Balo, PA-C  brimonidine (ALPHAGAN) 0.2 % ophthalmic solution Place 1 drop into both eyes 2 (two) times daily. 05/09/20   [provider]  diclofenac Sodium (VOLTAREN) 1 % GEL Apply 2 g topically 2 (two) times daily as needed (back pain). 06/16/20   [provider]  ferrous gluconate (FERGON) 324 MG tablet Take 1 tablet (324 mg total) by mouth daily with breakfast. 08/30/22   Tysinger, Kermit Balo, PA-C  gabapentin (NEURONTIN) 600 MG tablet Take 600 mg by mouth 2 (two) times daily.    [provider]  isosorbide mononitrate (IMDUR) 120 MG 24 hr tablet Take 1 tablet (120 mg total) by mouth daily. 02/19/22   Kathleene Hazel, MD  latanoprost (XALATAN) 0.005 % ophthalmic solution Place 1 drop into both eyes at bedtime. 02/28/20   [provider]  nitroGLYCERIN (NITROSTAT) 0.4 MG SL tablet Place 1 tablet (0.4 mg total) under the tongue every 5 (five) minutes as needed for chest pain (max 3 doses). 02/19/22   Verne Carrow  D, MD  omeprazole (PRILOSEC) 20 MG capsule Take 20 mg by mouth daily.    [provider]  polyethylene glycol powder (GLYCOLAX/MIRALAX) 17 GM/SCOOP powder Take 17 g by mouth daily. 01/25/22   Tysinger, Kermit Balo, PA-C  pravastatin (PRAVACHOL) 40 MG tablet TAKE 1 TABLET(40 MG) BY MOUTH EVERY EVENING 02/19/22   Kathleene Hazel, MD  predniSONE (DELTASONE) 5 MG tablet Take 5 mg by mouth in the morning. 07/17/20   [provider]  tamsulosin (FLOMAX) 0.4 MG CAPS capsule Take 0.4 mg by mouth at bedtime. 02/06/20    [provider]  terbinafine (LAMISIL AT) 1 % cream Apply 1 Application topically 2 (two) times daily. 10/07/22   Tysinger, Kermit Balo, PA-C  traMADol (ULTRAM) 50 MG tablet TAKE 1 TABLET(50 MG) BY MOUTH EVERY 12 HOURS AS NEEDED 06/25/22   Tysinger, Kermit Balo, PA-C      Allergies    Topiramate, Atenolol, Lisinopril, and Tramadol-acetaminophen    Review of Systems   Review of Systems  All other systems reviewed and are negative.   Physical Exam Updated Vital Signs BP 99/73   Pulse 66   Temp 97.7 F (36.5 C) (Oral)   Resp 18   SpO2 100%  Physical Exam Vitals and nursing note reviewed.  Constitutional:      General: He is not in acute distress.    Appearance: Normal appearance.  HENT:     Head: Normocephalic and atraumatic.     Mouth/Throat:     Mouth: Mucous membranes are dry.  Eyes:     Conjunctiva/sclera: Conjunctivae normal.  Cardiovascular:     Rate and Rhythm: Normal rate and regular rhythm.     Heart sounds: No murmur heard. Pulmonary:     Effort: Pulmonary effort is normal. No respiratory distress.     Breath sounds: Normal breath sounds. No stridor. No wheezing, rhonchi or rales.  Abdominal:     General: Abdomen is flat. There is no distension.     Palpations: Abdomen is soft.     Tenderness: There is no abdominal tenderness. There is no guarding or rebound.  Musculoskeletal:        General: No deformity. Normal range of motion.     Cervical back: Normal range of motion and neck supple. No rigidity or tenderness.     Right lower leg: No edema.     Left lower leg: No edema.  Skin:    General: Skin is warm and dry.     Capillary Refill: Capillary refill takes less than 2 seconds.  Neurological:     General: No focal deficit present.     Mental Status: He is alert and oriented to person, place, and time.     GCS: GCS eye subscore is 4. GCS verbal subscore is 5. GCS motor subscore is 6.     Cranial Nerves: Cranial nerves 2-12 are intact. No cranial nerve  deficit, dysarthria or facial asymmetry.     Sensory: Sensation is intact.     Motor: Motor function is intact. No weakness, tremor, atrophy, abnormal muscle tone or seizure activity.     Coordination: Coordination is intact.  Psychiatric:        Speech: Speech normal.        Behavior: Behavior normal. Behavior is cooperative.     ED Results / Procedures / Treatments   Labs (all labs ordered are listed, but only abnormal results are displayed) Labs Reviewed  BASIC METABOLIC PANEL - Abnormal; Notable for the following  components:      Result Value   CO2 21 (*)    Glucose, Bld 128 (*)    Creatinine, Ser 1.32 (*)    GFR, Estimated 54 (*)    Anion gap 16 (*)    All other components within normal limits  CBC WITH DIFFERENTIAL/PLATELET - Abnormal; Notable for the following components:   Hemoglobin 9.2 (*)    HCT 30.5 (*)    MCV 70.6 (*)    MCH 21.3 (*)    RDW 19.0 (*)    Lymphs Abs 0.6 (*)    Abs Immature Granulocytes 0.11 (*)    All other components within normal limits  URINALYSIS, ROUTINE W REFLEX MICROSCOPIC - Abnormal; Notable for the following components:   Specific Gravity, Urine 1.004 (*)    All other components within normal limits  CBG MONITORING, ED - Abnormal; Notable for the following components:   Glucose-Capillary 118 (*)    All other components within normal limits  I-STAT VENOUS BLOOD GAS, ED - Abnormal; Notable for the following components:   pCO2, Ven 40.0 (*)    pO2, Ven 132 (*)    HCT 29.0 (*)    Hemoglobin 9.9 (*)    All other components within normal limits  TROPONIN I (HIGH SENSITIVITY)  TROPONIN I (HIGH SENSITIVITY)    EKG EKG Interpretation Date/Time:  Sunday October 31 2022 11:38:37 EDT Ventricular Rate:  60 PR Interval:  200 QRS Duration:  120 QT Interval:  457 QTC Calculation: 457 R Axis:   18  Text Interpretation: Atrial and ventricular paced rhythm Incomplete left bundle branch block Anterior Q waves, possibly due to ILBBB Confirmed by  Vonita Moss [phone]) on 10/31/2022 11:48:43 AM  Radiology DG Chest 2 View  Result Date: 10/31/2022 CLINICAL DATA:  Syncope. EXAM: CHEST - 2 VIEW COMPARISON:  Chest radiograph dated 09/02/2022 FINDINGS: The heart size and mediastinal contours are within normal limits. A left subclavian approach cardiac device is redemonstrated. Both lungs are clear. Degenerative changes are seen in the spine. IMPRESSION: No active cardiopulmonary disease. Electronically Signed   By: Romona Curls M.D.   On: 10/31/2022 12:36   CT HEAD WO CONTRAST  Result Date: 10/31/2022 CLINICAL DATA:  Syncope/presyncope with cerebrovascular cause suspected. EXAM: CT HEAD WITHOUT CONTRAST TECHNIQUE: Contiguous axial images were obtained from the base of the skull through the vertex without intravenous contrast. RADIATION DOSE REDUCTION: This exam was performed according to the departmental dose-optimization program which includes automated exposure control, adjustment of the mA and/or kV according to patient size and/or use of iterative reconstruction technique. COMPARISON:  02/27/2019 FINDINGS: Brain: Areas of encephalomalacia at the anterior right temporal lobe an right more than left high frontal cortex are unchanged. No evidence of acute infarct, hemorrhage, hydrocephalus, or mass. Vascular: No hyperdense vessel or unexpected calcification. Skull: Normal. Negative for fracture or focal lesion. Sinuses/Orbits: No acute finding. IMPRESSION: No acute or reversible finding. Electronically Signed   By: Tiburcio Pea M.D.   On: 10/31/2022 12:35    Procedures Procedures   Orthostatic Lying BP- Lying: 112/66 Pulse- Lying: 59 Orthostatic Sitting BP- Sitting: 100/60 Pulse- Sitting: 65 Orthostatic Standing at 0 minutes BP- Standing at 0 minutes: 92/58 Pulse- Standing at 0 minutes: 60    Medications Ordered in ED Medications  lactated ringers bolus 1,000 mL (0 mLs Intravenous Stopped 10/31/22 1401)  sodium chloride 0.9 % bolus  500 mL (0 mLs Intravenous Stopped 10/31/22 1401)    ED Course/ Medical Decision Making/ A&P  Medical Decision Making Amount and/or Complexity of Data Reviewed Labs: ordered. Decision-making details documented in ED Course. Radiology: ordered. Decision-making details documented in ED Course. ECG/medicine tests: ordered. Decision-making details documented in ED Course.   Medical Decision Making:   TSUGIO ELISON is a 82 y.o. male who presented to the ED today with syncope detailed above.    Patient's presentation is complicated by their history of advanced age, multiple comorbidities.  Complete initial physical exam performed, notably the patient was in no acute distress.  Neurologically intact.  Abdomen soft and nontender.  Regular rate and rhythm, lungs clear to auscultation.  No respiratory distress.    Reviewed and confirmed nursing documentation for past medical history, family history, social history.    Initial Assessment:   With the patient's presentation, the differential diagnosis for AMS is extensive and includes, but is not limited to: drug overdose - opioids, alcohol, sedatives, antipsychotics, drug withdrawal, others; Metabolic: hypoxia, hypoglycemia, hyperglycemia, hypercalcemia, hypernatremia, hyponatremia, uremia, hepatic encephalopathy, hypothyroidism, hyperthyroidism, vitamin B12 or thiamine deficiency, carbon monoxide poisoning, Wilson's disease, Lactic acidosis, DKA/HHOS; Infectious: meningitis, encephalitis, bacteremia/sepsis, urinary tract infection, pneumonia, neurosyphilis; Structural: Space-occupying lesion, (brain tumor, subdural hematoma, hydrocephalus,); Vascular: stroke, subarachnoid hemorrhage, coronary ischemia, hypertensive encephalopathy, CNS vasculitis, thrombotic thrombocytopenic purpura, disseminated intravascular coagulation, hyperviscosity; Psychiatric: Schizophrenia, depression; Other: Seizure, hypothermia, heat stroke, dementia     This is most consistent with an acute complicated illness  Initial Plan:  Screening labs including CBC and Metabolic panel to evaluate for infectious or metabolic etiology of disease.  Urinalysis with reflex culture ordered to evaluate for UTI or relevant urologic/nephrologic pathology.  CXR to evaluate for structural/infectious intrathoracic pathology.  EKG and troponin to evaluate for cardiac pathology CT brain to assess for intracranial pathology Fluids for volume repletion Objective evaluation as below reviewed   Initial Study Results:   Laboratory  All laboratory results reviewed without evidence of clinically relevant pathology.   Exceptions include: Creatinine 1.32, GFR 54, hemoglobin 9.2  EKG EKG was reviewed independently.  Paced rhythm.  Radiology:  All images reviewed independently. Agree with radiology report at this time.   DG Chest 2 View  Result Date: 10/31/2022 CLINICAL DATA:  Syncope. EXAM: CHEST - 2 VIEW COMPARISON:  Chest radiograph dated 09/02/2022 FINDINGS: The heart size and mediastinal contours are within normal limits. A left subclavian approach cardiac device is redemonstrated. Both lungs are clear. Degenerative changes are seen in the spine. IMPRESSION: No active cardiopulmonary disease. Electronically Signed   By: Romona Curls M.D.   On: 10/31/2022 12:36   CT HEAD WO CONTRAST  Result Date: 10/31/2022 CLINICAL DATA:  Syncope/presyncope with cerebrovascular cause suspected. EXAM: CT HEAD WITHOUT CONTRAST TECHNIQUE: Contiguous axial images were obtained from the base of the skull through the vertex without intravenous contrast. RADIATION DOSE REDUCTION: This exam was performed according to the departmental dose-optimization program which includes automated exposure control, adjustment of the mA and/or kV according to patient size and/or use of iterative reconstruction technique. COMPARISON:  02/27/2019 FINDINGS: Brain: Areas of encephalomalacia at the anterior  right temporal lobe an right more than left high frontal cortex are unchanged. No evidence of acute infarct, hemorrhage, hydrocephalus, or mass. Vascular: No hyperdense vessel or unexpected calcification. Skull: Normal. Negative for fracture or focal lesion. Sinuses/Orbits: No acute finding. IMPRESSION: No acute or reversible finding. Electronically Signed   By: Tiburcio Pea M.D.   On: 10/31/2022 12:35      Final Assessment and Plan:   82 year old male presents to the ED post  syncopal episode.  He does have mild orthostasis on orthostatic vital signs.  He is nontoxic-appearing.  Has no acute complaints on initial evaluation here.  States that he feels a warm sensation while in church and then collapsed but bystanders were able to assist him to the ground.  No head or other injury.  Neurologically intact on initial exam and throughout ED stay.  He does mildly dehydrated.  Fluids given to treat this.  CT brain negative.  Chest x-ray without acute infectious process.  Urine not infected.  Kidney function minimally elevated from baseline.  Hemoglobin stable.  Troponin normal.  No acute changes on EKG.  Pacemaker interrogated with good report.  Throughout extensive ED stay, patient reports that he remains asymptomatic.  Discussed patient with attending physician who cosigned this note who also evaluated patient and agrees with management.  He was initially minimally hypertensive but this responded to fluids and patient tolerating p.o.  With this, to point patient is stable for discharge home which he is comfortable with.  Patient encouraged to increase hydration at home and eat regular meals to prevent any potential hypoglycemia.  Patient agreeable to do so.  He will follow-up closely with his primary care provider.  Strict ED return precautions given, all questions answered, and stable for discharge.   Clinical Impression:  1. Syncope and collapse   2. Orthostatic hypotension   3. Dehydration       Discharge           Final Clinical Impression(s) / ED Diagnoses Final diagnoses:  Syncope and collapse  Orthostatic hypotension  Dehydration    Rx / DC Orders ED Discharge Orders     None         Richardson Dopp 10/31/22 2123    Rondel Baton, MD 11/03/22 1225

## 2022-10-31 NOTE — ED Notes (Signed)
RN called and requested pacemaker interrogation report. [employer] stated they would call RN back with report.

## 2022-10-31 NOTE — ED Notes (Signed)
Pts pacemaker interrogated  

## 2022-11-03 ENCOUNTER — Ambulatory Visit (INDEPENDENT_AMBULATORY_CARE_PROVIDER_SITE_OTHER): Payer: Medicare HMO | Admitting: Medical

## 2022-11-03 ENCOUNTER — Encounter: Payer: Self-pay | Admitting: Medical

## 2022-11-03 VITALS — BP 122/68 | HR 60 | Wt 230.6 lb

## 2022-11-03 DIAGNOSIS — D649 Anemia, unspecified: Secondary | ICD-10-CM | POA: Diagnosis not present

## 2022-11-03 DIAGNOSIS — I10 Essential (primary) hypertension: Secondary | ICD-10-CM | POA: Diagnosis not present

## 2022-11-03 DIAGNOSIS — R195 Other fecal abnormalities: Secondary | ICD-10-CM

## 2022-11-03 DIAGNOSIS — K921 Melena: Secondary | ICD-10-CM

## 2022-11-03 NOTE — Progress Notes (Signed)
Subjective:  Joel [name] is a 82 y.o. male who presents for Chief Complaint  Patient presents with   other    Started Monday evening with blood in stool, today seems like it went away, started diarrhea yesterday seems to be getting better slight stomach pain,      Here for blood in stool, once Monday, once Tuesday.  Saw maybe light red color.  has had 2 days of diarrhea which is when he saw some blood.  Maybe 3-4 loose stools the last 2 days each.  This morning brown stool, no blood.  Using pepto bismol the last 2 days.  No fever, no body aches, no chills.   Denies weak or lightheaded.  No bruising  Is taking Aspirin 81mg  daily  No other aggravating or relieving factors.    No other c/o.  Past Medical History:  Diagnosis Date   Alpha thalassemia trait    Atypical chest pain    CAD (coronary artery disease)    a. Multiple prior evaluations then 05/2012 s/p BMS to OM2. b. Nuc 11/2012 wnl. c. Cath 2015: patent stent, moderate LAD/RCA dz, treated medically.   Cataract    CHEST PAIN UNSPECIFIED 09/13/2008   Qualifier: Diagnosis of  By: Trevor Iha, RN, Heather     CKD (chronic kidney disease), stage II    COPD (chronic obstructive pulmonary disease) (HCC)    Cough secondary to angiotensin converting enzyme inhibitor (ACE-I)    Diabetes mellitus (HCC) 09/07/2019   Diabetes mellitus without complication (HCC)    Diverticulosis    DVT (deep venous thrombosis) (HCC)    Family history of breast cancer in first degree relative    GERD (gastroesophageal reflux disease)    Headache(784.0)    Hx: of   History of blood transfusion    "I've had 2; don't know what it was related to" (11/09/2012)   HLD (hyperlipidemia)    HTN (hypertension)    Iron deficiency anemia    Lymphoproliferative disorder, low grade B cell (HCC) 05/21/2020   Microcytic anemia    PAF (paroxysmal atrial fibrillation) (HCC)    Peripheral vascular disease (HCC)    a. L pop-tibial bypass 2011, followed by [facility].   Personal  history of prostate cancer    Presence of permanent cardiac pacemaker    Rotator cuff injury    "left arm; never repaired" (11/09/2012)   Current Outpatient Medications on File Prior to Visit  Medication Sig Dispense Refill   abiraterone acetate (ZYTIGA) 250 MG tablet Take 250 mg by mouth in the morning.     Accu-Chek FastClix Lancets MISC Test once daily 102 each 12   albuterol (VENTOLIN HFA) 108 (90 Base) MCG/ACT inhaler Inhale 2 puffs into the lungs every 6 (six) hours as needed for wheezing or shortness of breath. 54 g 0   amLODipine (NORVASC) 10 MG tablet TAKE 1 TABLET(10 MG) BY MOUTH DAILY 90 tablet 3   aspirin EC 81 MG tablet Take 1 tablet (81 mg total) by mouth daily. Swallow whole. 90 tablet 3   brimonidine (ALPHAGAN) 0.2 % ophthalmic solution Place 1 drop into both eyes 2 (two) times daily.     diclofenac Sodium (VOLTAREN) 1 % GEL Apply 2 g topically 2 (two) times daily as needed (back pain).     ferrous gluconate (FERGON) 324 MG tablet Take 1 tablet (324 mg total) by mouth daily with breakfast. 30 tablet 2   gabapentin (NEURONTIN) 600 MG tablet Take 600 mg by mouth 2 (two) times daily.  isosorbide mononitrate (IMDUR) 120 MG 24 hr tablet Take 1 tablet (120 mg total) by mouth daily. 90 tablet 2   latanoprost (XALATAN) 0.005 % ophthalmic solution Place 1 drop into both eyes at bedtime.     nitroGLYCERIN (NITROSTAT) 0.4 MG SL tablet Place 1 tablet (0.4 mg total) under the tongue every 5 (five) minutes as needed for chest pain (max 3 doses). 75 tablet 2   omeprazole (PRILOSEC) 20 MG capsule Take 20 mg by mouth daily.     polyethylene glycol powder (GLYCOLAX/MIRALAX) 17 GM/SCOOP powder Take 17 g by mouth daily. 289 g 0   pravastatin (PRAVACHOL) 40 MG tablet TAKE 1 TABLET(40 MG) BY MOUTH EVERY EVENING 90 tablet 2   predniSONE (DELTASONE) 5 MG tablet Take 5 mg by mouth in the morning.     tamsulosin (FLOMAX) 0.4 MG CAPS capsule Take 0.4 mg by mouth at bedtime.     terbinafine (LAMISIL  AT) 1 % cream Apply 1 Application topically 2 (two) times daily. 30 g 0   traMADol (ULTRAM) 50 MG tablet TAKE 1 TABLET(50 MG) BY MOUTH EVERY 12 HOURS AS NEEDED 30 tablet 0   benzonatate (TESSALON) 200 MG capsule Take 1 capsule (200 mg total) by mouth 3 (three) times daily as needed for cough. (Patient not taking: Reported on 11/03/2022) 30 capsule 0   No current facility-administered medications on file prior to visit.     The following portions of the patient's history were reviewed and updated as appropriate: allergies, current medications, past family history, past medical history, past social history, past surgical history and problem list.  ROS Otherwise as in subjective above    Objective: BP 122/68   Pulse 60   Wt 230 lb 9.6 oz (104.6 kg)   BMI 34.05 kg/m   Wt Readings from Last 3 Encounters:  11/03/22 230 lb 9.6 oz (104.6 kg)  10/07/22 229 lb 12.8 oz (104.2 kg)  09/02/22 234 lb (106.1 kg)    General appearance: alert, no distress, well developed, well nourished neck: supple, no lymphadenopathy, no thyromegaly, no masses Heart: RRR, normal S1, S2, no murmurs Lungs: CTA bilaterally, no wheezes, rhonchi, or rales Abdomen: +bs, soft, non tender, non distended, no masses, no hepatomegaly, no splenomegaly Pulses: 2+ radial pulses, 2+ pedal pulses, normal cap refill Ext: no edema    05/24/2022 IMPRESSION: 1. No acute intra-abdominal process. 2. Nonobstructive left renal calculi. 3. Diverticulosis without diverticulitis. 4. Small hiatal hernia. 5. Aortic atherosclerosis with dilatation of the juxtarenal aorta measuring 3.3 cm, decreased in size. 6. Right common iliac artery aneurysm measuring 4.4 cm with aortic and iliac artery endograft, not significantly changed     Assessment: Encounter Diagnoses  Name Primary?   Loose stools Yes   Blood in stool    Anemia, unspecified type    Essential hypertension      Plan: We discussed his 2 recent episodes of a  little bit of blood in the stool with diarrhea the last 2 days but improving as of today.  Etiology could be inflammation, infection or other.  He is not toxic appearing though.  He feels okay.  Hemoglobin has been running in the 10 range.  His recent iron test in May 2024 was low.  He has started him on [place] then.  CT abdomen pelvis back in February 2024 reviewed as above.  He notes that he has a colonoscopy planned with the [facility] next month in August.  He feels like his stools are improved today.  If any worse diarrhea or is bleeding in the stool in the next few days we will likely need to do stool studies for GI profile and recheck CBC.  I reviewed the hospital report where he went to emergency department this weekend, hemoglobin was stable around 10 then.   Seyon was seen today for other.  Diagnoses and all orders for this visit:  Loose stools  Blood in stool  Anemia, unspecified type -     Iron, TIBC and Ferritin Panel  Essential hypertension    Follow up: pending lab

## 2022-11-04 LAB — IRON,TIBC AND FERRITIN PANEL
Ferritin: 317 ng/mL (ref 30–400)
Iron: 34 ug/dL — ABNORMAL LOW (ref 38–169)
Total Iron Binding Capacity: 259 ug/dL (ref 250–450)

## 2022-11-05 NOTE — Progress Notes (Signed)
Results sent through MyChart

## 2022-11-10 ENCOUNTER — Other Ambulatory Visit: Payer: Self-pay | Admitting: Cardiovascular Disease

## 2022-11-25 ENCOUNTER — Institutional Professional Consult (permissible substitution): Payer: Medicare HMO | Admitting: Medical

## 2022-12-02 ENCOUNTER — Institutional Professional Consult (permissible substitution): Payer: Medicare HMO | Admitting: Medical

## 2022-12-10 ENCOUNTER — Ambulatory Visit (INDEPENDENT_AMBULATORY_CARE_PROVIDER_SITE_OTHER): Payer: Medicare HMO

## 2022-12-10 DIAGNOSIS — I442 Atrioventricular block, complete: Secondary | ICD-10-CM | POA: Diagnosis not present

## 2022-12-10 LAB — CUP PACEART REMOTE DEVICE CHECK
Battery Remaining Longevity: 88 mo
Battery Remaining Percentage: 88 %
Battery Voltage: 2.99 V
Brady Statistic AP VP Percent: 60 %
Brady Statistic AP VS Percent: 1 %
Brady Statistic AS VP Percent: 39 %
Brady Statistic AS VS Percent: 1 %
Brady Statistic RA Percent Paced: 59 %
Brady Statistic RV Percent Paced: 99 %
Date Time Interrogation Session: 20240830055357
Implantable Lead Connection Status: 753985
Implantable Lead Connection Status: 753985
Implantable Lead Implant Date: 20230525
Implantable Lead Implant Date: 20230525
Implantable Lead Location: 753859
Implantable Lead Location: 753860
Implantable Pulse Generator Implant Date: 20230525
Lead Channel Impedance Value: 440 Ohm
Lead Channel Impedance Value: 510 Ohm
Lead Channel Pacing Threshold Amplitude: 0.5 V
Lead Channel Pacing Threshold Amplitude: 0.75 V
Lead Channel Pacing Threshold Pulse Width: 0.5 ms
Lead Channel Pacing Threshold Pulse Width: 0.5 ms
Lead Channel Sensing Intrinsic Amplitude: 11.3 mV
Lead Channel Sensing Intrinsic Amplitude: 5 mV
Lead Channel Setting Pacing Amplitude: 2 V
Lead Channel Setting Pacing Amplitude: 2.5 V
Lead Channel Setting Pacing Pulse Width: 0.5 ms
Lead Channel Setting Sensing Sensitivity: 4 mV
Pulse Gen Model: 2272
Pulse Gen Serial Number: 8084442

## 2022-12-14 NOTE — Progress Notes (Signed)
Remote pacemaker transmission.   

## 2022-12-20 LAB — HM DIABETES EYE EXAM

## 2023-01-04 ENCOUNTER — Encounter (HOSPITAL_COMMUNITY): Payer: Self-pay | Admitting: Emergency Medicine

## 2023-01-04 ENCOUNTER — Ambulatory Visit (HOSPITAL_COMMUNITY)
Admission: EM | Admit: 2023-01-04 | Discharge: 2023-01-04 | Disposition: A | Discharge status: Home / Self Care | Payer: Medicare HMO | Attending: Internal Medicine | Admitting: Internal Medicine

## 2023-01-04 DIAGNOSIS — E11628 Type 2 diabetes mellitus with other skin complications: Secondary | ICD-10-CM

## 2023-01-04 DIAGNOSIS — S51832A Puncture wound without foreign body of left forearm, initial encounter: Secondary | ICD-10-CM | POA: Diagnosis not present

## 2023-01-04 DIAGNOSIS — L02414 Cutaneous abscess of left upper limb: Secondary | ICD-10-CM

## 2023-01-04 DIAGNOSIS — Z23 Encounter for immunization: Secondary | ICD-10-CM

## 2023-01-04 MED ORDER — DOXYCYCLINE HYCLATE 100 MG PO CAPS: 100.0000 mg | ORAL_CAPSULE | Freq: Two times a day (BID) | ORAL | 0 refills | Status: AC

## 2023-01-04 MED ORDER — TETANUS-DIPHTH-ACELL PERTUSSIS 5-2.5-18.5 LF-MCG/0.5 IM SUSY
0.5000 mL | PREFILLED_SYRINGE | Freq: Once | INTRAMUSCULAR | Status: AC
  Administered 2023-01-04: 0.5 mL via INTRAMUSCULAR

## 2023-01-04 MED ORDER — TETANUS-DIPHTH-ACELL PERTUSSIS 5-2.5-18.5 LF-MCG/0.5 IM SUSY
PREFILLED_SYRINGE | INTRAMUSCULAR | Status: AC
  Filled 2023-01-04: qty 0.5

## 2023-01-04 NOTE — ED Provider Notes (Signed)
[facility]    CSN: [id number] Arrival date & time: 01/04/23  1607      History   Chief Complaint No chief complaint on file.   HPI Joel [name] is a 82 y.o. male.   Joel [name] is a 82 y.o. male presenting for  evaluation of wound to the left posterior forearm as a result of injury that happened 6 days ago.  Patient was working on his truck when he accidentally bumped his arm onto a hard object resulting in abrasion to the left posterior forearm.  Initially, wound bled but bleeding controlled with pressure.  2 days ago, he started noticing the wound became swollen, more tender, and with some pus to the middle of it.  Wound is warm and firm.  Last tetanus injection was greater than 5 years ago (2017).  No numbness or tingling distally to injury or weakness to the left hand.  No recent antibiotic/steroid use.  He is undergoing treatment currently for prostate cancer and has a history of type 2 diabetes/immunosuppression.  No allergies to antibiotics.  Has not attempted treatment of symptoms PTA.     Past Medical History:  Diagnosis Date   Alpha thalassemia trait    Atypical chest pain    CAD (coronary artery disease)    a. Multiple prior evaluations then 05/2012 s/p BMS to OM2. b. Nuc 11/2012 wnl. c. Cath 2015: patent stent, moderate LAD/RCA dz, treated medically.   Cataract    CHEST PAIN UNSPECIFIED 09/13/2008   Qualifier: Diagnosis of  By: Trevor Iha, RN, Heather     CKD (chronic kidney disease), stage II    COPD (chronic obstructive pulmonary disease) (HCC)    Cough secondary to angiotensin converting enzyme inhibitor (ACE-I)    Diabetes mellitus (HCC) 09/07/2019   Diabetes mellitus without complication (HCC)    Diverticulosis    DVT (deep venous thrombosis) (HCC)    Family history of breast cancer in first degree relative    GERD (gastroesophageal reflux disease)    Headache(784.0)    Hx: of   History of blood transfusion    "I've had 2; don't know  what it was related to" (11/09/2012)   HLD (hyperlipidemia)    HTN (hypertension)    Iron deficiency anemia    Lymphoproliferative disorder, low grade B cell (HCC) 05/21/2020   Microcytic anemia    PAF (paroxysmal atrial fibrillation) (HCC)    Peripheral vascular disease (HCC)    a. L pop-tibial bypass 2011, followed by [facility].   Personal history of prostate cancer    Presence of permanent cardiac pacemaker    Rotator cuff injury    "left arm; never repaired" (11/09/2012)    Patient Active Problem List   Diagnosis Date Noted   Paroxysmal atrial fibrillation (HCC) 09/02/2022   Chronic constipation 06/09/2022   Pulmonary nodule 02/17/2022   Flatulence 01/22/2022   Anemia 01/22/2022   Pacemaker 12/17/2021   Heart block 08/04/2021   AV block, Mobitz 1 06/26/2021   Constipation, unspecified 05/11/2021   Non-Hodgkin lymphoma, unspecified, unspecified site (HCC) 05/11/2021   Encounter for therapeutic drug level monitoring 05/11/2021   Alpha thalassaemia minor 12/22/2020   Benign hypertensive heart disease 12/22/2020   Contact with and (suspected) exposure to other environmental pollution 12/22/2020   Aortic atherosclerosis (HCC) 07/17/2020   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   Lymphoproliferative disorder, low grade B cell (HCC)-Stage 4 05/21/2020   Chest pain 02/10/2020   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   Carcinoma of prostate (HCC) 09/06/2019   Chest pain of uncertain etiology    Sleep related hypoxia 04/03/2019   Obesity (BMI 30-39.9) 04/03/2019   Snoring 04/03/2019   At risk for sleep apnea 01/10/2019   Daytime somnolence 01/10/2019   Controlled type 2 diabetes mellitus with hyperglycemia, without long-term current use of insulin (HCC) 12/11/2018   Hypoxic encephalopathy (HCC) 12/11/2018   Obesity hypoventilation syndrome (HCC) 12/11/2018   Chronic low back pain 09/11/2018   COPD (chronic obstructive pulmonary disease) (HCC)     Seasonal allergies 07/04/2018   Pulsatile tinnitus 02/17/2018   Dizziness 01/20/2018   Malaise 01/20/2018   Decreased breath sounds 01/20/2018   Needs flu shot 01/20/2018   Chronic nonintractable headache 12/07/2017   Back pain 11/01/2017   Radicular pain of lower extremity 11/01/2017   Prostate cancer (HCC) 11/01/2017   Family history of breast cancer in first degree relative    Adenomatous polyp of colon 04/12/2017   Elliptocytosis on peripheral blood smear (HCC) 09/13/2016   PVD (peripheral vascular disease) with claudication (HCC) 11/15/2012   Chest pain, localized 11/10/2012   Hypokalemia 11/10/2012   Bradycardia 11/09/2012   Thrombocytopenia (HCC) 06/01/2012   Microcytic anemia    Atherosclerosis of native artery of extremity with intermittent claudication (HCC) 06/16/2011   Stable angina 06/11/2011   CAD (coronary artery disease)    Chest pain, atypical 05/30/2011   Iron deficiency anemia 10/03/2009   DIVERTICULOSIS OF COLON 10/03/2009   WEIGHT LOSS, ABNORMAL 10/03/2009   Headache(784.0) 08/19/2008   TIA 06/05/2008   SHOULDER PAIN, LEFT 10/11/2007   COUGH DUE TO ACE INHIBITORS 05/07/2007   History of tobacco use disorder 04/25/2007   Hyperlipidemia 04/21/2007   Essential hypertension 04/21/2007   GERD 04/21/2007    Past Surgical History:  Procedure Laterality Date   CARDIOVASCULAR STRESS TEST  Aug. 2014   CATARACT EXTRACTION W/ INTRAOCULAR LENS IMPLANT Right ~ 2008   CIRCUMCISION     COLONOSCOPY     CORONARY ANGIOPLASTY WITH STENT PLACEMENT  2014   "1" (11/09/2012)   ILIAC ARTERY ANEURYSM REPAIR     LEFT ATRIAL APPENDAGE OCCLUSION N/A 09/02/2022   Procedure: LEFT ATRIAL APPENDAGE OCCLUSION;  Surgeon: Lanier Prude, MD;  [facility];  Service: Cardiovascular;  Laterality: N/A;   LEFT HEART CATH  05-31-12   LEFT HEART CATH AND CORONARY ANGIOGRAPHY N/A 08/01/2019   Procedure: LEFT HEART CATH AND CORONARY ANGIOGRAPHY;  Surgeon: Kathleene Hazel, MD;  [facility];  Service: Cardiovascular;  Laterality: N/A;   LEFT HEART CATHETERIZATION WITH CORONARY ANGIOGRAM N/A 06/01/2011   Procedure: LEFT HEART CATHETERIZATION WITH CORONARY ANGIOGRAM;  Surgeon: Herby Abraham, MD;  Location: [facility] CATH LAB;  Service: Cardiovascular;  Laterality: N/A;   LEFT HEART CATHETERIZATION WITH CORONARY ANGIOGRAM N/A 05/31/2012   Procedure: LEFT HEART CATHETERIZATION WITH CORONARY ANGIOGRAM;  Surgeon: Peter M Swaziland, MD;  Location: [facility] CATH LAB;  Service: Cardiovascular;  Laterality: N/A;   LEFT HEART CATHETERIZATION WITH CORONARY ANGIOGRAM N/A 08/01/2013   Procedure: LEFT HEART CATHETERIZATION WITH CORONARY ANGIOGRAM;  Surgeon: Kathleene Hazel, MD;  Location: [facility] CATH LAB;  Service: Cardiovascular;  Laterality: N/A;   PACEMAKER IMPLANT N/A 09/03/2021   Procedure: PACEMAKER IMPLANT;  Surgeon: Marinus Maw, MD;  [facility];  Service: Cardiovascular;  Laterality: N/A;   PERCUTANEOUS CORONARY STENT INTERVENTION (PCI-S)  05/31/2012   Procedure: PERCUTANEOUS CORONARY STENT INTERVENTION (PCI-S);  Surgeon: Peter M Swaziland,  MD;  Location: [facility];  Service: Cardiovascular;;   PR VEIN BYPASS GRAFT,AORTO-FEM-POP Left 10/01/2009   left below knee popliteal artery to posterior tibial artery   SHOULDER ARTHROSCOPY WITH OPEN ROTATOR CUFF REPAIR AND DISTAL CLAVICLE ACROMINECTOMY Left 01/12/2013   Procedure: LEFT SHOULDER ARTHROSCOPY WITH DEBRIDEMENT OPEN DISTAL CLAVICLE RESECTION ,acromioplastyAND ROTATOR CUFF REPAIR;  Surgeon: Thera Flake., MD;  Location: [facility];  Service: Orthopedics;  Laterality: Left;   TEE WITHOUT CARDIOVERSION N/A 09/02/2022   Procedure: TRANSESOPHAGEAL ECHOCARDIOGRAM;  Surgeon: Lanier Prude, MD;  Location: [facility] INVASIVE CV LAB;  Service: Cardiovascular;  Laterality: N/A;       Home Medications    Prior to Admission medications   Medication Sig Start Date End Date Taking? Authorizing Provider   doxycycline (VIBRAMYCIN) 100 MG capsule Take 1 capsule (100 mg total) by mouth 2 (two) times daily for 7 days. 01/04/23 01/11/23 Yes StanhopeDonavan Burnet, FNP  abiraterone acetate (ZYTIGA) 250 MG tablet Take 250 mg by mouth in the morning. 02/17/18   [provider]  Accu-Chek FastClix Lancets MISC Test once daily 11/23/18   Henson, Vickie L, NP-C  albuterol (VENTOLIN HFA) 108 (90 Base) MCG/ACT inhaler Inhale 2 puffs into the lungs every 6 (six) hours as needed for wheezing or shortness of breath. 08/27/22   Tysinger, Kermit Balo, PA-C  amLODipine (NORVASC) 10 MG tablet TAKE 1 TABLET EVERY DAY 11/10/22   Kathleene Hazel, MD  aspirin EC 81 MG tablet Take 1 tablet (81 mg total) by mouth daily. Swallow whole. 03/17/22   Kathleene Hazel, MD  benzonatate (TESSALON) 200 MG capsule Take 1 capsule (200 mg total) by mouth 3 (three) times daily as needed for cough. Patient not taking: Reported on 11/03/2022 10/07/22   Tysinger, Kermit Balo, PA-C  brimonidine [facility]) 0.2 % ophthalmic solution Place 1 drop into both eyes 2 (two) times daily. 05/09/20   [provider]  diclofenac Sodium (VOLTAREN) 1 % GEL Apply 2 g topically 2 (two) times daily as needed (back pain). 06/16/20   [provider]  ferrous gluconate (FERGON) 324 MG tablet Take 1 tablet (324 mg total) by mouth daily with breakfast. 08/30/22   Tysinger, Kermit Balo, PA-C  gabapentin (NEURONTIN) 600 MG tablet Take 600 mg by mouth 2 (two) times daily.    [provider]  isosorbide mononitrate (IMDUR) 120 MG 24 hr tablet Take 1 tablet (120 mg total) by mouth daily. 02/19/22   Kathleene Hazel, MD  latanoprost (XALATAN) 0.005 % ophthalmic solution Place 1 drop into both eyes at bedtime. 02/28/20   [provider]  nitroGLYCERIN (NITROSTAT) 0.4 MG SL tablet Place 1 tablet (0.4 mg total) under the tongue every 5 (five) minutes as needed for chest pain (max 3 doses). 02/19/22   Kathleene Hazel, MD   omeprazole (PRILOSEC) 20 MG capsule Take 20 mg by mouth daily.    [provider]  polyethylene glycol powder (GLYCOLAX/MIRALAX) 17 GM/SCOOP powder Take 17 g by mouth daily. 01/25/22   Tysinger, Kermit Balo, PA-C  pravastatin (PRAVACHOL) 40 MG tablet TAKE 1 TABLET EVERY EVENING 11/10/22   Kathleene Hazel, MD  predniSONE (DELTASONE) 5 MG tablet Take 5 mg by mouth in the morning. 07/17/20   [provider]  tamsulosin (FLOMAX) 0.4 MG CAPS capsule Take 0.4 mg by mouth at bedtime. 02/06/20   [provider]  terbinafine (LAMISIL AT) 1 % cream Apply 1 Application topically 2 (two) times daily. 10/07/22   Tysinger,  Kermit Balo, PA-C  traMADol (ULTRAM) 50 MG tablet TAKE 1 TABLET(50 MG) BY MOUTH EVERY 12 HOURS AS NEEDED 06/25/22   Tysinger, Kermit Balo, PA-C    Family History Family History  Problem Relation Age of Onset   Breast cancer Mother        dx 41s   Hyperlipidemia Mother    Hypertension Mother    COPD Father        deceased   Hyperlipidemia Father    Hypertension Father    Hypertension Sister    Cancer Brother 59       Unknown cancer, possibly stomach   Hypertension Brother    Cancer Brother 59       Unknown, possibly lung   Hypertension Daughter    Hypertension Son    Colon cancer Neg Hx        pt is unclear about family hx    Social History Social History   Tobacco Use   Smoking status: Former    Current packs/day: 0.00    Average packs/day: 0.5 packs/day for 52.0 years (26.0 ttl pk-yrs)    Types: Cigarettes    Start date: 05/26/1959    Quit date: 05/26/2011    Years since quitting: 11.6    Passive exposure: Past   Smokeless tobacco: Never  Vaping Use   Vaping status: Never Used  Substance Use Topics   Alcohol use: No   Drug use: No     Allergies   Topiramate, Atenolol, Lisinopril, and Tramadol-acetaminophen   Review of Systems Review of Systems Per HPI  Physical Exam Triage Vital Signs ED Triage Vitals  Encounter Vitals Group      BP 01/04/23 1740 (!) 149/75     Systolic BP Percentile --      Diastolic BP Percentile --      Pulse Rate 01/04/23 1740 60     Resp 01/04/23 1740 18     Temp 01/04/23 1740 97.6 F (36.4 C)     Temp Source 01/04/23 1740 Oral     SpO2 01/04/23 1740 94 %     Weight --      Height --      Head Circumference --      Peak Flow --      Pain Score 01/04/23 1736 8     Pain Loc --      Pain Education --      Exclude from Growth Chart --    No data found.  Updated Vital Signs BP (!) 149/75 (BP Location: Left Arm)   Pulse 60   Temp 97.6 F (36.4 C) (Oral)   Resp 18   SpO2 94%   Visual Acuity Right Eye Distance:   Left Eye Distance:   Bilateral Distance:    Right Eye Near:   Left Eye Near:    Bilateral Near:     Physical Exam Vitals and nursing note reviewed.  Constitutional:      Appearance: He is not ill-appearing or toxic-appearing.  HENT:     Head: Normocephalic and atraumatic.     Right Ear: Hearing and external ear normal.     Left Ear: Hearing and external ear normal.     Nose: Nose normal.     Mouth/Throat:     Lips: Pink.  Eyes:     General: Lids are normal. Vision grossly intact. Gaze aligned appropriately.     Extraocular Movements: Extraocular movements intact.     Conjunctiva/sclera: Conjunctivae normal.  Pulmonary:  Effort: Pulmonary effort is normal.  Musculoskeletal:     Cervical back: Neck supple.  Skin:    General: Skin is warm and dry.     Capillary Refill: Capillary refill takes less than 2 seconds.     Findings: No rash.          Comments: Wound to the left posterior forearm as seen in image below.  There is an area of surrounding soft tissue swelling and induration with warmth and erythema.  Tender to palpation.  Evidence of purulent drainage to the center of the wound.  +2 left radial pulse with normal strength and sensation intact distally.  Neurological:     General: No focal deficit present.     Mental Status: He is alert and oriented  to person, place, and time. Mental status is at baseline.     Cranial Nerves: No dysarthria or facial asymmetry.  Psychiatric:        Mood and Affect: Mood normal.        Speech: Speech normal.        Behavior: Behavior normal.        Thought Content: Thought content normal.        Judgment: Judgment normal.      UC Treatments / Results  Labs (all labs ordered are listed, but only abnormal results are displayed) Labs Reviewed - No data to display  EKG   Radiology No results found.  Procedures Procedures (including critical care time)  Medications Ordered in UC Medications  Tdap (BOOSTRIX) injection 0.5 mL (0.5 mLs Intramuscular Given 01/04/23 1810)    Initial Impression / Assessment and Plan / UC Course  I have reviewed the triage vital signs and the nursing notes.  Pertinent labs & imaging results that were available during my care of the patient were reviewed by me and considered in my medical decision making (see chart for details).   1.  Puncture wound of left forearm, need for tetanus booster, abscess of left forearm, type 2 diabetes Abscess of left forearm is immature, therefore will defer I&D today.  Will treat with doxycycline twice daily for 7 days and warm compresses.  Wound care discussed.  Patient is at risk for delayed wound healing given diabetes and immunosuppression.  Advised to follow-up with PCP in the next 3 to 5 days for ongoing evaluation and management of this.  He may return to urgent care if he notices any new or worsening signs of infection.  ER return precautions discussed.  Tylenol as needed for pain. Tdap updated today due to mechanism of injury/wound.  Counseled patient on potential for adverse effects with medications prescribed/recommended today, strict ER and return-to-clinic precautions discussed, patient verbalized understanding.    Final Clinical Impressions(s) / UC Diagnoses   Final diagnoses:  Puncture wound of left forearm, initial  encounter  Need for tetanus booster  Abscess of left forearm  Type 2 diabetes mellitus with other skin complication, without long-term current use of insulin [facility])     Discharge Instructions      Your wound on your left forearm seems to have formed an abscess which is a collection of infected pus.   Take doxycycline twice a day for 7 days with food.  Use warm compresses to the wound to help the wound drain and swelling go down.  Tylenol as needed for pain.  Return if you notice worsening signs of infection such as redness, swelling, pus, pain.  If you develop any new or worsening symptoms  or if your symptoms do not start to improve, please return here or follow-up with your primary care provider. If your symptoms are severe, please go to the emergency room.     ED Prescriptions     Medication Sig Dispense Auth. Provider   doxycycline (VIBRAMYCIN) 100 MG capsule Take 1 capsule (100 mg total) by mouth 2 (two) times daily for 7 days. 14 capsule Carlisle Beers, FNP      PDMP not reviewed this encounter.   Carlisle Beers, [place] 01/04/23 1814

## 2023-01-04 NOTE — ED Triage Notes (Signed)
Pt has wound on left arm from hitting left forearm on truck two days ago. He has tried neosporin and peroxide to clean wound.

## 2023-01-04 NOTE — Discharge Instructions (Signed)
Your wound on your left forearm seems to have formed an abscess which is a collection of infected pus.   Take doxycycline twice a day for 7 days with food.  Use warm compresses to the wound to help the wound drain and swelling go down.  Tylenol as needed for pain.  Return if you notice worsening signs of infection such as redness, swelling, pus, pain.  If you develop any new or worsening symptoms or if your symptoms do not start to improve, please return here or follow-up with your primary care provider. If your symptoms are severe, please go to the emergency room.

## 2023-01-10 ENCOUNTER — Encounter: Payer: Self-pay | Admitting: Medical

## 2023-01-10 ENCOUNTER — Ambulatory Visit (INDEPENDENT_AMBULATORY_CARE_PROVIDER_SITE_OTHER): Payer: Medicare HMO | Admitting: Medical

## 2023-01-10 VITALS — BP 124/70 | HR 60 | Temp 97.0°F | Wt 223.4 lb

## 2023-01-10 DIAGNOSIS — T148XXA Other injury of unspecified body region, initial encounter: Secondary | ICD-10-CM

## 2023-01-10 DIAGNOSIS — Z23 Encounter for immunization: Secondary | ICD-10-CM | POA: Diagnosis not present

## 2023-01-10 DIAGNOSIS — L989 Disorder of the skin and subcutaneous tissue, unspecified: Secondary | ICD-10-CM | POA: Diagnosis not present

## 2023-01-10 MED ORDER — SILVER SULFADIAZINE 1 % EX CREA: 1.0000 | TOPICAL_CREAM | Freq: Every day | CUTANEOUS | 0 refills | Status: DC

## 2023-01-10 MED ORDER — CEPHALEXIN 500 MG PO CAPS: 500.0000 mg | ORAL_CAPSULE | Freq: Three times a day (TID) | ORAL | 0 refills | Status: DC

## 2023-01-10 NOTE — Patient Instructions (Signed)
Recommendations: Doreatha Martin out your doxycycline oral antibiotic Begin Keflex antibiotic 3 times a day Use warm wet rag referral warm compress to the wound at least twice a day for the next several days When you shower daily I want you to take a wash rag with some soapy water and agitate the wound a little bit. If it bleeds that is okay as that might help promote healing Begin Silvadene cream topically to the wound once or twice a day, cover with gauze and bandage I am referring you to a wound clinic for further evaluation and treatment If it seems to be much worse in the next 2 to 3 days and let me see it again ASAP

## 2023-01-10 NOTE — Progress Notes (Signed)
Subjective:  Joel [name] is a 82 y.o. male who presents for Chief Complaint  Patient presents with   infected sore    Infected sore, went to urgent care on 9/24 and was given doxcycline and tdap and doesn't seem to be working     2 weeks ago was putting a sensor in his car, cut his arm on a piece of metal under dash of his car.  He cleaned the wound, put bandaid on the area.  The wound got bigger last week, more tender, and he kept bumping it.  Went to urgent care 6 days ago, put on doxycycline antibiotic.  Using some neosporin topically.   No fever, no body ache or chills.  Not much improved, actually may be worse.  Denies any concern for foreign body.  No other aggravating or relieving factors.    No other c/o.  The following portions of the patient's history were reviewed and updated as appropriate: allergies, current medications, past family history, past medical history, past social history, past surgical history and problem list.  ROS Otherwise as in subjective above   Objective: BP 124/70   Pulse 60   Temp (!) 97 F (36.1 C)   Wt 223 lb 6.4 oz (101.3 kg)   BMI 32.99 kg/m   General appearance: alert, no distress, well developed, well nourished Skin: 2 cm raised area with some redness pink coloration around the perimeter, some eschar centrally and some subcutaneous irritated tissue but no obvious drainage, no induration or fluctuance no warmth.  Area is mildly tender. Arm with normal sensation, strength and pulse    Assessment: Encounter Diagnoses  Name Primary?   COVID-19 vaccine administered Yes   Needs flu shot    Skin lesion    Abrasion      Plan: We discussed the skin findings.  No obvious abscess but unusual abrasion wound is not healing.  We agitated the wound today, cleaned the wound and covered with Silvadene and a fresh bandage.  We discussed the final recommendations and treatment   Patient Instructions  Recommendations: Joel [name] out your  doxycycline oral antibiotic Begin Keflex antibiotic 3 times a day Use warm wet rag referral warm compress to the wound at least twice a day for the next several days When you shower daily I want you to take a wash rag with some soapy water and agitate the wound a little bit. If it bleeds that is okay as that might help promote healing Begin Silvadene cream topically to the wound once or twice a day, cover with gauze and bandage I am referring you to a wound clinic for further evaluation and treatment If it seems to be much worse in the next 2 to 3 days and let me see it again ASAP   Counseled on the influenza virus vaccine.  Vaccine information sheet given.  Influenza vaccine given after consent obtained.  Counseled on the Covid virus vaccine.  Vaccine information sheet given.  Covid vaccine given after consent obtained.   Joel [name] was seen today for infected sore.  Diagnoses and all orders for this visit:  COVID-19 vaccine administered -     Pfizer Comirnaty Covid -19 Vaccine 34yrs and older  Needs flu shot -     Flu Vaccine Trivalent High Dose (Fluad)  Skin lesion -     Ambulatory referral to Wound Clinic  Abrasion -     Ambulatory referral to Wound Clinic  Other orders -     silver sulfADIAZINE (  SILVADENE) 1 % cream; Apply 1 Application topically daily. -     cephALEXin (KEFLEX) 500 MG capsule; Take 1 capsule (500 mg total) by mouth 3 (three) times daily.    Follow up: 4-5 days

## 2023-01-13 ENCOUNTER — Encounter: Payer: Self-pay | Admitting: Internal Medicine

## 2023-01-24 ENCOUNTER — Ambulatory Visit: Payer: Medicare HMO | Admitting: Internal Medicine

## 2023-01-25 ENCOUNTER — Other Ambulatory Visit: Payer: Self-pay | Admitting: Specialist

## 2023-01-25 ENCOUNTER — Encounter (HOSPITAL_BASED_OUTPATIENT_CLINIC_OR_DEPARTMENT_OTHER): Payer: Medicare HMO | Attending: Internal Medicine | Admitting: Internal Medicine

## 2023-01-25 DIAGNOSIS — R519 Headache, unspecified: Secondary | ICD-10-CM

## 2023-01-31 ENCOUNTER — Encounter (HOSPITAL_COMMUNITY): Payer: Self-pay

## 2023-01-31 ENCOUNTER — Other Ambulatory Visit: Payer: Self-pay

## 2023-01-31 ENCOUNTER — Emergency Department (HOSPITAL_COMMUNITY)
Admission: EM | Admit: 2023-01-31 | Discharge: 2023-01-31 | Disposition: A | Discharge status: Home / Self Care | Payer: Medicare HMO | Attending: Emergency Medicine | Admitting: Emergency Medicine

## 2023-01-31 ENCOUNTER — Emergency Department (HOSPITAL_COMMUNITY): Payer: Medicare HMO

## 2023-01-31 DIAGNOSIS — I10 Essential (primary) hypertension: Secondary | ICD-10-CM | POA: Insufficient documentation

## 2023-01-31 DIAGNOSIS — Z7982 Long term (current) use of aspirin: Secondary | ICD-10-CM | POA: Diagnosis not present

## 2023-01-31 DIAGNOSIS — E119 Type 2 diabetes mellitus without complications: Secondary | ICD-10-CM | POA: Insufficient documentation

## 2023-01-31 DIAGNOSIS — I2510 Atherosclerotic heart disease of native coronary artery without angina pectoris: Secondary | ICD-10-CM | POA: Insufficient documentation

## 2023-01-31 DIAGNOSIS — R42 Dizziness and giddiness: Secondary | ICD-10-CM | POA: Diagnosis present

## 2023-01-31 DIAGNOSIS — Z79899 Other long term (current) drug therapy: Secondary | ICD-10-CM | POA: Insufficient documentation

## 2023-01-31 DIAGNOSIS — R55 Syncope and collapse: Secondary | ICD-10-CM | POA: Insufficient documentation

## 2023-01-31 DIAGNOSIS — J449 Chronic obstructive pulmonary disease, unspecified: Secondary | ICD-10-CM | POA: Diagnosis not present

## 2023-01-31 LAB — URINALYSIS, ROUTINE W REFLEX MICROSCOPIC
Bilirubin Urine: NEGATIVE
Glucose, UA: NEGATIVE mg/dL
Hgb urine dipstick: NEGATIVE
Ketones, ur: NEGATIVE mg/dL
Leukocytes,Ua: NEGATIVE
Nitrite: NEGATIVE
Protein, ur: NEGATIVE mg/dL
Specific Gravity, Urine: 1.014 (ref 1.005–1.030)
pH: 5 (ref 5.0–8.0)

## 2023-01-31 LAB — CBC WITH DIFFERENTIAL/PLATELET
Abs Immature Granulocytes: 0.06 10*3/uL (ref 0.00–0.07)
Basophils Absolute: 0 10*3/uL (ref 0.0–0.1)
Basophils Relative: 1 %
Eosinophils Absolute: 0.4 10*3/uL (ref 0.0–0.5)
Eosinophils Relative: 10 %
HCT: 32.1 % — ABNORMAL LOW (ref 39.0–52.0)
Hemoglobin: 9.5 g/dL — ABNORMAL LOW (ref 13.0–17.0)
Immature Granulocytes: 2 %
Lymphocytes Relative: 28 %
Lymphs Abs: 1.1 10*3/uL (ref 0.7–4.0)
MCH: 21.1 pg — ABNORMAL LOW (ref 26.0–34.0)
MCHC: 29.6 g/dL — ABNORMAL LOW (ref 30.0–36.0)
MCV: 71.3 fL — ABNORMAL LOW (ref 80.0–100.0)
Monocytes Absolute: 0.5 10*3/uL (ref 0.1–1.0)
Monocytes Relative: 13 %
Neutro Abs: 1.7 10*3/uL (ref 1.7–7.7)
Neutrophils Relative %: 46 %
Platelets: 182 10*3/uL (ref 150–400)
RBC: 4.5 MIL/uL (ref 4.22–5.81)
RDW: 17.5 % — ABNORMAL HIGH (ref 11.5–15.5)
WBC: 3.7 10*3/uL — ABNORMAL LOW (ref 4.0–10.5)
nRBC: 0 % (ref 0.0–0.2)

## 2023-01-31 LAB — TROPONIN I (HIGH SENSITIVITY)
Troponin I (High Sensitivity): 10 ng/L (ref <18)
Troponin I (High Sensitivity): 7 ng/L (ref <18)

## 2023-01-31 LAB — TYPE AND SCREEN
ABO/RH(D): AB POS
Antibody Screen: NEGATIVE

## 2023-01-31 LAB — BASIC METABOLIC PANEL
Anion gap: 11 (ref 5–15)
BUN: 11 mg/dL (ref 8–23)
CO2: 24 mmol/L (ref 22–32)
Calcium: 9.2 mg/dL (ref 8.9–10.3)
Chloride: 104 mmol/L (ref 98–111)
Creatinine, Ser: 1.17 mg/dL (ref 0.61–1.24)
GFR, Estimated: >60 mL/min (ref >60)
Glucose, Bld: 128 mg/dL — ABNORMAL HIGH (ref 70–99)
Potassium: 3.3 mmol/L — ABNORMAL LOW (ref 3.5–5.1)
Sodium: 139 mmol/L (ref 135–145)

## 2023-01-31 LAB — HEPATIC FUNCTION PANEL
ALT: 14 U/L (ref 0–44)
AST: 17 U/L (ref 15–41)
Albumin: 3.4 g/dL — ABNORMAL LOW (ref 3.5–5.0)
Alkaline Phosphatase: 66 U/L (ref 38–126)
Bilirubin, Direct: 0.1 mg/dL (ref 0.0–0.2)
Indirect Bilirubin: 0.3 mg/dL (ref 0.3–0.9)
Total Bilirubin: 0.4 mg/dL (ref 0.3–1.2)
Total Protein: 6 g/dL — ABNORMAL LOW (ref 6.5–8.1)

## 2023-01-31 LAB — CBG MONITORING, ED: Glucose-Capillary: 127 mg/dL — ABNORMAL HIGH (ref 70–99)

## 2023-01-31 LAB — MAGNESIUM: Magnesium: 1.8 mg/dL (ref 1.7–2.4)

## 2023-01-31 MED ORDER — LACTATED RINGERS IV BOLUS
1000.0000 mL | Freq: Once | INTRAVENOUS | Status: AC
  Administered 2023-01-31: 1000 mL via INTRAVENOUS

## 2023-01-31 MED ORDER — POTASSIUM CHLORIDE CRYS ER 20 MEQ PO TBCR
40.0000 meq | EXTENDED_RELEASE_TABLET | Freq: Once | ORAL | Status: AC
  Administered 2023-01-31: 40 meq via ORAL
  Filled 2023-01-31: qty 2

## 2023-01-31 MED ORDER — IOHEXOL 350 MG/ML SOLN
75.0000 mL | Freq: Once | INTRAVENOUS | Status: AC | PRN
  Administered 2023-01-31: 75 mL via INTRAVENOUS

## 2023-01-31 MED ORDER — ACETAMINOPHEN 325 MG PO TABS
650.0000 mg | ORAL_TABLET | Freq: Once | ORAL | Status: AC
  Administered 2023-01-31: 650 mg via ORAL
  Filled 2023-01-31: qty 2

## 2023-01-31 NOTE — ED Notes (Signed)
Pt pacemaker retransmitted to [facility].

## 2023-01-31 NOTE — ED Notes (Signed)
Pt ambulated to restroom with cane, no distress noted or reported by patient.

## 2023-01-31 NOTE — Discharge Instructions (Addendum)
CT scans today showed a 25 mm mass in your left parotid gland.  You should see a ear, nose, throat doctor for further evaluation.  Call the telephone number below to set up that appointment.   Space your daily home medications apart to avoid episodes of low blood pressures.  Follow-up with your primary care doctor to discuss these daily medications.    Return to the emergency department for any new or worsening symptoms of concern.

## 2023-01-31 NOTE — ED Notes (Signed)
Pacemaker interrogated. 

## 2023-01-31 NOTE — ED Notes (Signed)
Patient transported to CT 

## 2023-01-31 NOTE — ED Provider Notes (Signed)
EMERGENCY DEPARTMENT AT [facility] Provider Note   CSN: [id number] Arrival date & time: 01/31/23  8295     History  Chief Complaint  Patient presents with   Dizziness   Weakness    Joel [name] is a 82 y.o. male.   Dizziness Associated symptoms: weakness   Weakness Associated symptoms: dizziness   Patient presents for near-syncope.  Medical history includes CAD, COPD, HLD, anemia, HTN, GERD, atrial fibrillation, DM, DVT, PVD.  He woke up today in his normal state of health.  At around 7:30 AM, he took all of his medications.  At around 8:00 AM, he had an episode of lightheadedness and dizziness.  He denies loss of consciousness.  He did become so weak that he fell to the floor.  He denies any areas of pain since the fall.     Home Medications Prior to Admission medications   Medication Sig Start Date End Date Taking? Authorizing Provider  abiraterone acetate (ZYTIGA) 250 MG tablet Take 1,000 mg by mouth daily. On an empty stomach 02/17/18  Yes [provider]  Accu-Chek FastClix Lancets MISC Test once daily 11/23/18   Henson, Vickie L, NP-C  albuterol (VENTOLIN HFA) 108 (90 Base) MCG/ACT inhaler Inhale 2 puffs into the lungs every 6 (six) hours as needed for wheezing or shortness of breath. 08/27/22   Tysinger, Kermit Balo, PA-C  amLODipine (NORVASC) 10 MG tablet TAKE 1 TABLET EVERY DAY 11/10/22   Kathleene Hazel, MD  aspirin EC 81 MG tablet Take 1 tablet (81 mg total) by mouth daily. Swallow whole. 03/17/22   Kathleene Hazel, MD  brimonidine (ALPHAGAN) 0.2 % ophthalmic solution Place 1 drop into both eyes 2 (two) times daily. 05/09/20   [provider]  diclofenac Sodium (VOLTAREN) 1 % GEL Apply 2 g topically 2 (two) times daily as needed (back pain). 06/16/20   [provider]  ferrous gluconate (FERGON) 324 MG tablet Take 1 tablet (324 mg total) by mouth daily with breakfast. 08/30/22   Tysinger, Kermit Balo, PA-C   gabapentin (NEURONTIN) 600 MG tablet Take 600 mg by mouth 2 (two) times daily.    [provider]  isosorbide mononitrate (IMDUR) 120 MG 24 hr tablet Take 1 tablet (120 mg total) by mouth daily. 02/19/22   Kathleene Hazel, MD  latanoprost (XALATAN) 0.005 % ophthalmic solution Place 1 drop into both eyes at bedtime. 02/28/20   [provider]  lidocaine (LIDODERM) 5 % Place 1 patch onto the skin daily. 10/26/22   [provider]  losartan-hydrochlorothiazide (HYZAAR) 50-12.5 MG tablet Take 1 tablet by mouth daily. 11/27/14   [provider]  nitroGLYCERIN (NITROSTAT) 0.4 MG SL tablet Place 1 tablet (0.4 mg total) under the tongue every 5 (five) minutes as needed for chest pain (max 3 doses). 02/19/22   Kathleene Hazel, MD  omeprazole (PRILOSEC) 20 MG capsule Take 20 mg by mouth daily.    [provider]  polyethylene glycol powder (GLYCOLAX/MIRALAX) 17 GM/SCOOP powder Take 17 g by mouth daily. 01/25/22   Tysinger, Kermit Balo, PA-C  pravastatin (PRAVACHOL) 40 MG tablet TAKE 1 TABLET EVERY EVENING 11/10/22   Kathleene Hazel, MD  predniSONE (DELTASONE) 5 MG tablet Take 5 mg by mouth in the morning. 07/17/20   [provider]  silver sulfADIAZINE (SILVADENE) 1 % cream Apply 1 Application topically daily. 01/10/23   Tysinger, Kermit Balo, PA-C  tamsulosin (FLOMAX) 0.4 MG CAPS capsule Take 0.4 mg by  mouth daily.    [provider]  traMADol (ULTRAM) 50 MG tablet TAKE 1 TABLET(50 MG) BY MOUTH EVERY 12 HOURS AS NEEDED Patient not taking: Reported on 01/10/2023 06/25/22   Tysinger, Kermit Balo, PA-C      Allergies    Topiramate, Atenolol, Lisinopril, and Tramadol-acetaminophen    Review of Systems   Review of Systems  Neurological:  Positive for dizziness, weakness and light-headedness.  All other systems reviewed and are negative.   Physical Exam Updated Vital Signs BP (!) 148/78   Pulse 63   Temp (!) 97.4 F (36.3 C) (Oral)    Resp 18   Ht 5\' 9"  (1.753 m)   Wt 108 kg   SpO2 100%   BMI 35.15 kg/m  Physical Exam Vitals and nursing note reviewed.  Constitutional:      General: He is not in acute distress.    Appearance: Normal appearance. He is well-developed. He is not ill-appearing, toxic-appearing or diaphoretic.  HENT:     Head: Normocephalic and atraumatic.     Right Ear: External ear normal.     Left Ear: External ear normal.     Nose: Nose normal.     Mouth/Throat:     Mouth: Mucous membranes are moist.  Eyes:     Extraocular Movements: Extraocular movements intact.     Conjunctiva/sclera: Conjunctivae normal.  Cardiovascular:     Rate and Rhythm: Normal rate and regular rhythm.  Pulmonary:     Effort: Pulmonary effort is normal. No respiratory distress.  Abdominal:     General: There is no distension.     Palpations: Abdomen is soft.     Tenderness: There is no abdominal tenderness.  Musculoskeletal:        General: No swelling. Normal range of motion.     Cervical back: Normal range of motion and neck supple.     Right lower leg: No edema.     Left lower leg: No edema.  Skin:    General: Skin is warm and dry.     Coloration: Skin is not jaundiced or pale.  Neurological:     General: No focal deficit present.     Mental Status: He is alert and oriented to person, place, and time.     Cranial Nerves: No cranial nerve deficit.     Sensory: No sensory deficit.     Motor: No weakness.     Coordination: Coordination normal.  Psychiatric:        Mood and Affect: Mood normal.        Behavior: Behavior normal.        Thought Content: Thought content normal.        Judgment: Judgment normal.     ED Results / Procedures / Treatments   Labs (all labs ordered are listed, but only abnormal results are displayed) Labs Reviewed  BASIC METABOLIC PANEL - Abnormal; Notable for the following components:      Result Value   Potassium 3.3 (*)    Glucose, Bld 128 (*)    All other components  within normal limits  CBC WITH DIFFERENTIAL/PLATELET - Abnormal; Notable for the following components:   WBC 3.7 (*)    Hemoglobin 9.5 (*)    HCT 32.1 (*)    MCV 71.3 (*)    MCH 21.1 (*)    MCHC 29.6 (*)    RDW 17.5 (*)    All other components within normal limits  HEPATIC FUNCTION PANEL - Abnormal; Notable for  the following components:   Total Protein 6.0 (*)    Albumin 3.4 (*)    All other components within normal limits  CBG MONITORING, ED - Abnormal; Notable for the following components:   Glucose-Capillary 127 (*)    All other components within normal limits  URINALYSIS, ROUTINE W REFLEX MICROSCOPIC  MAGNESIUM  TYPE AND SCREEN  TROPONIN I (HIGH SENSITIVITY)  TROPONIN I (HIGH SENSITIVITY)    EKG EKG Interpretation Date/Time:  Monday January 31 2023 09:39:55 EDT Ventricular Rate:  60 PR Interval:  186 QRS Duration:  140 QT Interval:  488 QTC Calculation: 488 R Axis:   8  Text Interpretation: AV dual-paced rhythm Confirmed by Gloris Manchester [phone]) on 01/31/2023 10:30:46 AM  Radiology CT ANGIO HEAD NECK W WO CM  Result Date: 01/31/2023 CLINICAL DATA:  Transient ischemic attack (TIA). EXAM: CT ANGIOGRAPHY HEAD AND NECK WITH AND WITHOUT CONTRAST TECHNIQUE: Multidetector CT imaging of the head and neck was performed using the standard protocol during bolus administration of intravenous contrast. Multiplanar CT image reconstructions and MIPs were obtained to evaluate the vascular anatomy. Carotid stenosis measurements (when applicable) are obtained utilizing NASCET criteria, using the distal internal carotid diameter as the denominator. RADIATION DOSE REDUCTION: This exam was performed according to the departmental dose-optimization program which includes automated exposure control, adjustment of the mA and/or kV according to patient size and/or use of iterative reconstruction technique. CONTRAST:  75mL OMNIPAQUE IOHEXOL 350 MG/ML SOLN COMPARISON:  Head CT 01/31/2023.  MRA head  10/02/2015. FINDINGS: CTA NECK FINDINGS Aortic arch: Three-vessel arch configuration. Atherosclerotic calcifications of the aortic arch and arch vessel origins. Arch vessel origins are patent. Right carotid system: No evidence of dissection, stenosis (50% or greater), or occlusion. Mild calcified plaque along the right carotid bulb and proximal right cervical ICA. Left carotid system: No evidence of dissection, stenosis (50% or greater), or occlusion. Mild calcified plaque along the left carotid bulb and proximal left cervical ICA. Vertebral arteries: Densely calcified plaque results in at least moderate stenosis of the left vertebral artery origin. Calcified plaque along the right vertebral artery origin with no more than mild stenosis. Remainder of the vertebral arteries are patent to the confluence with the basilar artery. Skeleton: Severe cervical spondylosis with ossification of the posterior longitudinal ligament resulting in at least moderate spinal canal stenosis at C3-4. Other neck: 25 mm left parotid mass (axial image 70 series 5). Upper chest: Unremarkable. Review of the MIP images confirms the above findings CTA HEAD FINDINGS Anterior circulation: Calcified plaque along the carotid siphons without hemodynamically significant stenosis. Unchanged 3.5 mm posteriorly projecting aneurysm arising from the left MCA M1 segment. Unchanged anteriorly projecting left MCA trifurcation aneurysm measuring up to 3 mm. Unchanged anteriorly projecting right MCA bifurcation aneurysm measuring up to 3.6 mm. Distal branches are symmetric. Posterior circulation: Normal basilar artery. The SCAs, AICAs and PICAs are patent proximally. The PCAs are patent proximally without stenosis or aneurysm. Distal branches are symmetric. Venous sinuses: As permitted by contrast timing, patent. Anatomic variants: Hypoplastic left A1 segment. Review of the MIP images confirms the above findings IMPRESSION: 1. No large vessel occlusion. 2.  Unchanged bilateral MCA aneurysms (3 total) measuring up to 3.6 mm on the right and 3.5 mm on the left. 3. Densely calcified plaque results in at least moderate stenosis of the left vertebral artery origin. 4. A 25 mm left parotid mass. Recommend ENT referral for further evaluation. 5. Severe cervical spondylosis with ossification of the posterior longitudinal ligament resulting in  at least moderate spinal canal stenosis at C3-4. Aortic Atherosclerosis (ICD10-I70.0). Electronically Signed   By: Orvan Falconer M.D.   On: 01/31/2023 16:18   CT HEAD WO CONTRAST  Result Date: 01/31/2023 CLINICAL DATA:  Syncope/presyncope, cerebrovascular cause suspected EXAM: CT HEAD WITHOUT CONTRAST TECHNIQUE: Contiguous axial images were obtained from the base of the skull through the vertex without intravenous contrast. RADIATION DOSE REDUCTION: This exam was performed according to the departmental dose-optimization program which includes automated exposure control, adjustment of the mA and/or kV according to patient size and/or use of iterative reconstruction technique. COMPARISON:  CT Head 10/01/22 FINDINGS: Brain: Hemorrhage. No hydrocephalus. No extra-axial fluid collection. No CT evidence of an acute cortical infarct. No mass effect. No mass lesion. Generalized volume loss. 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 Vascular: Hyperdense appearance of the basilar artery at the vertebrobasilar junction. This is nonspecific, but given history of dizziness underlying thrombosis is not excluded. Recommend further evaluation with a CTA head and neck angiogram. Skull: Normal. Negative for fracture or focal lesion. Sinuses/Orbits: No middle ear or mastoid effusion. Mucosal thickening right maxillary and ethmoid sinuses. Bilateral lens replacement. Orbits are otherwise unremarkable. Other: None. IMPRESSION: 1. Hyperdense appearance of the basilar artery at the vertebrobasilar junction. This is nonspecific and could be artifactual,  but given history of dizziness underlying thrombosis is not excluded. Recommend further evaluation with a CTA head and neck angiogram. 2. No acute intracranial hemorrhage or CT evidence of an acute cortical infarct. Electronically Signed   By: Lorenza Cambridge M.D.   On: 01/31/2023 13:17   DG Chest Port 1 View  Result Date: 01/31/2023 CLINICAL DATA:  Syncope EXAM: PORTABLE CHEST 1 VIEW COMPARISON:  10/11/2022 FINDINGS: Stable left upper chest pacemaker. No consolidation, pneumothorax or effusion. No edema. Normal cardiopericardial silhouette. Calcified aorta. Underinflation. Overlapping cardiac leads. IMPRESSION: Underinflation.  Pacemaker.  No acute cardiopulmonary disease. Electronically Signed   By: Karen Kays M.D.   On: 01/31/2023 11:39    Procedures Procedures    Medications Ordered in ED Medications  lactated ringers bolus 1,000 mL (0 mLs Intravenous Stopped 01/31/23 1156)  potassium chloride SA (KLOR-CON M) CR tablet 40 mEq (40 mEq Oral Given 01/31/23 1205)  acetaminophen (TYLENOL) tablet 650 mg (650 mg Oral Given 01/31/23 1418)  iohexol (OMNIPAQUE) 350 MG/ML injection 75 mL (75 mLs Intravenous Contrast Given 01/31/23 1357)    ED Course/ Medical Decision Making/ A&P                                 Medical Decision Making Amount and/or Complexity of Data Reviewed Labs: ordered. Radiology: ordered. ECG/medicine tests: ordered.  Risk OTC drugs. Prescription drug management.   This patient presents to the ED for concern of near syncope, this involves an extensive number of treatment options, and is a complaint that carries with it a high risk of complications and morbidity.  The differential diagnosis includes polypharmacy, vasovagal episode, arrhythmia, anemia, metabolic derangements, CVA, TIA   Co morbidities that complicate the patient evaluation  CAD, COPD, HLD, anemia, HTN, GERD, atrial fibrillation, DM, DVT, PVD   Additional history obtained:  Additional history  obtained from N/A External records from outside source obtained and reviewed including EMR   Lab Tests:  I Ordered, and personally interpreted labs.  The pertinent results include: Baseline anemia, slight leukopenia, mild hypokalemia with otherwise normal electrolytes, normal troponin   Imaging Studies ordered:  I ordered imaging studies  including chest x-ray, CT head, CTA head and neck I independently visualized and interpreted imaging which showed unchanged MCA aneurysms; moderate stenosis of left vertebral artery origin; 25 mm left parotid mass I agree with the radiologist interpretation   Cardiac Monitoring: / EKG:  The patient was maintained on a cardiac monitor.  I personally viewed and interpreted the cardiac monitored which showed an underlying rhythm of: Paced rhythm  Problem List / ED Course / Critical interventions / Medication management  Patient presents for episode of near syncope.  This occurred shortly prior to arrival.  On arrival, blood pressures are soft.  He is alert and oriented.  He has no focal neurologic deficits on exam.  Despite his fall, he does not have any areas of pain or tenderness.  When stood up, blood pressure drops further.  Patient was placed on bedside cardiac monitor.  IV fluids were ordered.  Workup was initiated.  Lab work was notable for mild hypokalemia.  Replacement potassium was ordered.  Patient underwent pacemaker interrogation which showed no recent events.  On CT of head, there was a concern of hypodense appearance of basilar artery.  CTA head and neck ordered to further evaluate.  While in the ED, patient had improved blood pressure.  His symptoms improved as well.  He did endorse a mild generalized headache.  Tylenol was ordered.  CTA showed no large vessel occlusion.  He has bilateral MCA aneurysms which have been identified on prior imaging.  He has a 25 mm left parotid mass.  He was informed of this.  He was advised to follow-up with ENT.   Patient was able to ambulate while in the ED with no further near syncopal symptoms.  He was advised to space out his blood pressure medications at home and to follow-up with PCP.  He was discharged in stable condition. I ordered medication including IV fluids for hydration; potassium chloride for hypokalemia; Tylenol for headache Reevaluation of the patient after these medicines showed that the patient resolved I have reviewed the patients home medicines and have made adjustments as needed   Social Determinants of Health:  Has access to outpatient care        Final Clinical Impression(s) / ED Diagnoses Final diagnoses:  Near syncope    Rx / DC Orders ED Discharge Orders     None         Gloris Manchester, MD 01/31/23 [phone]

## 2023-01-31 NOTE — ED Triage Notes (Signed)
When doing normal morning routine and suddenly felt weak and dizzy. Patient fell to the floor but denies hitting head.

## 2023-01-31 NOTE — ED Notes (Signed)
Pt will attempt to urinate when bolus is completed, patient reports not having to go at this time.

## 2023-02-02 ENCOUNTER — Encounter: Payer: Self-pay | Admitting: Internal Medicine

## 2023-02-02 ENCOUNTER — Ambulatory Visit: Payer: Medicare HMO | Attending: Internal Medicine | Admitting: Internal Medicine

## 2023-02-02 VITALS — BP 118/54 | HR 60 | Ht 69.0 in | Wt 230.8 lb

## 2023-02-02 DIAGNOSIS — I480 Paroxysmal atrial fibrillation: Secondary | ICD-10-CM | POA: Diagnosis not present

## 2023-02-02 DIAGNOSIS — Z950 Presence of cardiac pacemaker: Secondary | ICD-10-CM | POA: Diagnosis not present

## 2023-02-02 NOTE — Patient Instructions (Signed)

## 2023-02-02 NOTE — Progress Notes (Signed)
HPI Mr. Handfield returns today for ongoing evaluation of transient CHB. He is a pleasant 82 yo man with a h/o HTN, PAD, CAD, and has undergone cardiac monitoring which has demonstrated transient daytime heart block with 4 second pauses. He is s/p PPM insertion and was then found to have PAF. In addition he has non-sustained VT. He is on no AV or sinus nodal blocking drugs. He was started on systemic anti-coagulation and then developed GI bleeding with bright red blood. The bleeding resolved with cessation of systemic anti-coagulation. He otherwise feels better. He underwent attempted Watchman but his IVC had an extensive network of collaterals and the catheter could not get into the RA, much less across an intact atrial septum. He has occaisional dizzy spells.     Allergies  Allergen Reactions   Topiramate Shortness Of Breath and Other (See Comments)     leg cramps   Atenolol Other (See Comments)    bradycardia   Lisinopril Cough   Tramadol-Acetaminophen Cough    Tolerates plain Tramadol     Current Outpatient Medications  Medication Sig Dispense Refill   abiraterone acetate (ZYTIGA) 250 MG tablet Take 1,000 mg by mouth daily. On an empty stomach     Accu-Chek FastClix Lancets MISC Test once daily 102 each 12   albuterol (VENTOLIN HFA) 108 (90 Base) MCG/ACT inhaler Inhale 2 puffs into the lungs every 6 (six) hours as needed for wheezing or shortness of breath. 54 g 0   amLODipine (NORVASC) 10 MG tablet TAKE 1 TABLET EVERY DAY 90 tablet 3   aspirin EC 81 MG tablet Take 1 tablet (81 mg total) by mouth daily. Swallow whole. 90 tablet 3   brimonidine (ALPHAGAN) 0.2 % ophthalmic solution Place 1 drop into both eyes 2 (two) times daily.     diclofenac Sodium (VOLTAREN) 1 % GEL Apply 2 g topically 2 (two) times daily as needed (back pain).     ferrous gluconate (FERGON) 324 MG tablet Take 1 tablet (324 mg total) by mouth daily with breakfast. 30 tablet 2   gabapentin (NEURONTIN) 600 MG  tablet Take 600 mg by mouth 2 (two) times daily.     isosorbide mononitrate (IMDUR) 120 MG 24 hr tablet Take 1 tablet (120 mg total) by mouth daily. 90 tablet 2   latanoprost (XALATAN) 0.005 % ophthalmic solution Place 1 drop into both eyes at bedtime.     lidocaine (LIDODERM) 5 % Place 1 patch onto the skin daily.     losartan-hydrochlorothiazide (HYZAAR) 50-12.5 MG tablet Take 1 tablet by mouth daily.     nitroGLYCERIN (NITROSTAT) 0.4 MG SL tablet Place 1 tablet (0.4 mg total) under the tongue every 5 (five) minutes as needed for chest pain (max 3 doses). 75 tablet 2   omeprazole (PRILOSEC) 20 MG capsule Take 20 mg by mouth daily.     polyethylene glycol powder (GLYCOLAX/MIRALAX) 17 GM/SCOOP powder Take 17 g by mouth daily. 289 g 0   pravastatin (PRAVACHOL) 40 MG tablet TAKE 1 TABLET EVERY EVENING 90 tablet 3   predniSONE (DELTASONE) 5 MG tablet Take 5 mg by mouth in the morning.     silver sulfADIAZINE (SILVADENE) 1 % cream Apply 1 Application topically daily. 50 g 0   tamsulosin (FLOMAX) 0.4 MG CAPS capsule Take 0.4 mg by mouth daily.     traMADol (ULTRAM) 50 MG tablet TAKE 1 TABLET(50 MG) BY MOUTH EVERY 12 HOURS AS NEEDED 30 tablet 0   No current facility-administered  medications for this visit.     Past Medical History:  Diagnosis Date   Alpha thalassemia trait    Atypical chest pain    CAD (coronary artery disease)    a. Multiple prior evaluations then 05/2012 s/p BMS to OM2. b. Nuc 11/2012 wnl. c. Cath 2015: patent stent, moderate LAD/RCA dz, treated medically.   Cataract    CHEST PAIN UNSPECIFIED 09/13/2008   Qualifier: Diagnosis of  By: Trevor Iha, RN, Heather     CKD (chronic kidney disease), stage II    COPD (chronic obstructive pulmonary disease) (HCC)    Cough secondary to angiotensin converting enzyme inhibitor (ACE-I)    Diabetes mellitus (HCC) 09/07/2019   Diabetes mellitus without complication (HCC)    Diverticulosis    DVT (deep venous thrombosis) (HCC)    Family history  of breast cancer in first degree relative    GERD (gastroesophageal reflux disease)    Headache(784.0)    Hx: of   History of blood transfusion    "I've had 2; don't know what it was related to" (11/09/2012)   HLD (hyperlipidemia)    HTN (hypertension)    Iron deficiency anemia    Lymphoproliferative disorder, low grade B cell (HCC) 05/21/2020   Microcytic anemia    PAF (paroxysmal atrial fibrillation) (HCC)    Peripheral vascular disease (HCC)    a. L pop-tibial bypass 2011, followed by [facility].   Personal history of prostate cancer    Presence of permanent cardiac pacemaker    Rotator cuff injury    "left arm; never repaired" (11/09/2012)    ROS:   All systems reviewed and negative except as noted in the HPI.   Past Surgical History:  Procedure Laterality Date   CARDIOVASCULAR STRESS TEST  Aug. 2014   CATARACT EXTRACTION W/ INTRAOCULAR LENS IMPLANT Right ~ 2008   CIRCUMCISION     COLONOSCOPY     CORONARY ANGIOPLASTY WITH STENT PLACEMENT  2014   "1" (11/09/2012)   ILIAC ARTERY ANEURYSM REPAIR     LEFT ATRIAL APPENDAGE OCCLUSION N/A 09/02/2022   Procedure: LEFT ATRIAL APPENDAGE OCCLUSION;  Surgeon: Lanier Prude, MD;  Location: [facility];  Service: Cardiovascular;  Laterality: N/A;   LEFT HEART CATH  05-31-12   LEFT HEART CATH AND CORONARY ANGIOGRAPHY N/A 08/01/2019   Procedure: LEFT HEART CATH AND CORONARY ANGIOGRAPHY;  Surgeon: Kathleene Hazel, MD;  Location: [facility];  Service: Cardiovascular;  Laterality: N/A;   LEFT HEART CATHETERIZATION WITH CORONARY ANGIOGRAM N/A 06/01/2011   Procedure: LEFT HEART CATHETERIZATION WITH CORONARY ANGIOGRAM;  Surgeon: Herby Abraham, MD;  Location: [facility] CATH LAB;  Service: Cardiovascular;  Laterality: N/A;   LEFT HEART CATHETERIZATION WITH CORONARY ANGIOGRAM N/A 05/31/2012   Procedure: LEFT HEART CATHETERIZATION WITH CORONARY ANGIOGRAM;  Surgeon: Peter M Swaziland, MD;  Location: [facility] CATH LAB;  Service: Cardiovascular;   Laterality: N/A;   LEFT HEART CATHETERIZATION WITH CORONARY ANGIOGRAM N/A 08/01/2013   Procedure: LEFT HEART CATHETERIZATION WITH CORONARY ANGIOGRAM;  Surgeon: Kathleene Hazel, MD;  Location: [facility] CATH LAB;  Service: Cardiovascular;  Laterality: N/A;   PACEMAKER IMPLANT N/A 09/03/2021   Procedure: PACEMAKER IMPLANT;  Surgeon: Marinus Maw, MD;  Location: [facility];  Service: Cardiovascular;  Laterality: N/A;   PERCUTANEOUS CORONARY STENT INTERVENTION (PCI-S)  05/31/2012   Procedure: PERCUTANEOUS CORONARY STENT INTERVENTION (PCI-S);  Surgeon: Peter M Swaziland, MD;  Location: [facility] CATH LAB;  Service: Cardiovascular;;   PR VEIN BYPASS GRAFT,AORTO-FEM-POP Left 10/01/2009  left below knee popliteal artery to posterior tibial artery   SHOULDER ARTHROSCOPY WITH OPEN ROTATOR CUFF REPAIR AND DISTAL CLAVICLE ACROMINECTOMY Left 01/12/2013   Procedure: LEFT SHOULDER ARTHROSCOPY WITH DEBRIDEMENT OPEN DISTAL CLAVICLE RESECTION ,acromioplastyAND ROTATOR CUFF REPAIR;  Surgeon: Thera Flake., MD;  Location: [facility] OR;  Service: Orthopedics;  Laterality: Left;   TEE WITHOUT CARDIOVERSION N/A 09/02/2022   Procedure: TRANSESOPHAGEAL ECHOCARDIOGRAM;  Surgeon: Lanier Prude, MD;  Location: [facility] INVASIVE CV LAB;  Service: Cardiovascular;  Laterality: N/A;     Family History  Problem Relation Age of Onset   Breast cancer Mother        dx 34s   Hyperlipidemia Mother    Hypertension Mother    COPD Father        deceased   Hyperlipidemia Father    Hypertension Father    Hypertension Sister    Cancer Brother 71       Unknown cancer, possibly stomach   Hypertension Brother    Cancer Brother 33       Unknown, possibly lung   Hypertension Daughter    Hypertension Son    Colon cancer Neg Hx        pt is unclear about family hx     Social History   Socioeconomic History   Marital status: Widowed    Spouse name: Not on file   Number of children: Not on file   Years of education: Not on file    Highest education level: 12th grade  Occupational History   Occupation: Retired  Tobacco Use   Smoking status: Former    Current packs/day: 0.00    Average packs/day: 0.5 packs/day for 52.0 years (26.0 ttl pk-yrs)    Types: Cigarettes    Start date: 05/26/1959    Quit date: 05/26/2011    Years since quitting: 11.6    Passive exposure: Past   Smokeless tobacco: Never  Vaping Use   Vaping status: Never Used  Substance and Sexual Activity   Alcohol use: No   Drug use: No   Sexual activity: Not on file  Other Topics Concern   Not on file  Social History Narrative   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  Lives in [place] by himself.      Patient writes with his right hand, though uses his left hand for every thing else. He lives alone in a one level home. He drinks 3 cups of coffee a day and 3 7-8 oz sodas a day. He does not exercise.   Social Determinants of Health   Financial Resource Strain: Low Risk  (06/01/2022)   Overall Financial Resource Strain (CARDIA)    Difficulty of Paying Living Expenses: Not hard at all  Food Insecurity: No Food Insecurity (07/16/2022)   Hunger Vital Sign    Worried About Running Out of Food in the Last Year: Never true    Ran Out of Food in the Last Year: Never true  Transportation Needs: No Transportation Needs (07/16/2022)   PRAPARE - Administrator, Civil Service (Medical): No    Lack of Transportation (Non-Medical): No  Physical Activity: Inactive (06/01/2022)   Exercise Vital Sign    Days of Exercise per Week: 0 days    Minutes of Exercise per Session: 0 min  Stress: No Stress Concern Present (06/01/2022)   [employer] of Occupational Health - Occupational Stress Questionnaire    Feeling of Stress : Not at all  Social Connections: Unknown (08/21/2021)  Received from [facility], [facility]   Social Network    Social Network: Not on file  Intimate Partner Violence: Unknown (07/13/2021)   Received from [facility],  [facility]   HITS    Physically Hurt: Not on file    Insult or Talk Down To: Not on file    Threaten Physical Harm: Not on file    Scream or Curse: Not on file     BP (!) 118/54   Pulse 60   Ht 5\' 9"  (1.753 m)   Wt 230 lb 12.8 oz (104.7 kg)   SpO2 97%   BMI 34.08 kg/m   Physical Exam:  Well appearing NAD HEENT: Unremarkable Neck:  No JVD, no thyromegally Lymphatics:  No adenopathy Back:  No CVA tenderness Lungs:  Clear with no wheezes HEART:  Regular rate rhythm, no murmurs, no rubs, no clicks Abd:  soft, positive bowel sounds, no organomegally, no rebound, no guarding Ext:  2 plus pulses, no edema, no cyanosis, no clubbing Skin:  No rashes no nodules Neuro:  CN II through XII intact, motor grossly intact  DEVICE  Normal device function.  See PaceArt for details.   Assess/Plan:  Heart block - he has progressive conduction system disease and is now s/p PPM insertion. No symptoms. CAD - he denies anginal symptoms.  NSVT - he is symptomatic and hopefully this can be controlled with medical therapy after his PPM is inserted. Atrial fib - he has been found to have PAF. He is not a candidate for Watchman due to his venous system. He is not interested in open LAA removal.    Dorathy Daft

## 2023-02-06 ENCOUNTER — Encounter (HOSPITAL_COMMUNITY): Payer: Self-pay

## 2023-02-06 ENCOUNTER — Emergency Department (HOSPITAL_COMMUNITY): Payer: Medicare HMO

## 2023-02-06 ENCOUNTER — Other Ambulatory Visit: Payer: Self-pay

## 2023-02-06 ENCOUNTER — Emergency Department (HOSPITAL_COMMUNITY)
Admission: EM | Admit: 2023-02-06 | Discharge: 2023-02-06 | Disposition: A | Discharge status: Home / Self Care | Payer: Medicare HMO | Attending: Emergency Medicine | Admitting: Emergency Medicine

## 2023-02-06 DIAGNOSIS — R197 Diarrhea, unspecified: Secondary | ICD-10-CM | POA: Insufficient documentation

## 2023-02-06 DIAGNOSIS — E860 Dehydration: Secondary | ICD-10-CM | POA: Insufficient documentation

## 2023-02-06 DIAGNOSIS — Z7982 Long term (current) use of aspirin: Secondary | ICD-10-CM | POA: Insufficient documentation

## 2023-02-06 DIAGNOSIS — R55 Syncope and collapse: Secondary | ICD-10-CM | POA: Diagnosis not present

## 2023-02-06 LAB — I-STAT CHEM 8, ED
BUN: 10 mg/dL (ref 8–23)
Calcium, Ion: 1.17 mmol/L (ref 1.15–1.40)
Chloride: 104 mmol/L (ref 98–111)
Creatinine, Ser: 1.2 mg/dL (ref 0.61–1.24)
Glucose, Bld: 149 mg/dL — ABNORMAL HIGH (ref 70–99)
HCT: 30 % — ABNORMAL LOW (ref 39.0–52.0)
Hemoglobin: 10.2 g/dL — ABNORMAL LOW (ref 13.0–17.0)
Potassium: 3.8 mmol/L (ref 3.5–5.1)
Sodium: 139 mmol/L (ref 135–145)
TCO2: 23 mmol/L (ref 22–32)

## 2023-02-06 LAB — CBC WITH DIFFERENTIAL/PLATELET
Abs Immature Granulocytes: 0 10*3/uL (ref 0.00–0.07)
Basophils Absolute: 0.1 10*3/uL (ref 0.0–0.1)
Basophils Relative: 2 %
Eosinophils Absolute: 0.2 10*3/uL (ref 0.0–0.5)
Eosinophils Relative: 4 %
HCT: 30.5 % — ABNORMAL LOW (ref 39.0–52.0)
Hemoglobin: 9 g/dL — ABNORMAL LOW (ref 13.0–17.0)
Lymphocytes Relative: 24 %
Lymphs Abs: 1 10*3/uL (ref 0.7–4.0)
MCH: 20.8 pg — ABNORMAL LOW (ref 26.0–34.0)
MCHC: 29.5 g/dL — ABNORMAL LOW (ref 30.0–36.0)
MCV: 70.4 fL — ABNORMAL LOW (ref 80.0–100.0)
Monocytes Absolute: 0.3 10*3/uL (ref 0.1–1.0)
Monocytes Relative: 7 %
Neutro Abs: 2.6 10*3/uL (ref 1.7–7.7)
Neutrophils Relative %: 63 %
Platelets: 158 10*3/uL (ref 150–400)
RBC: 4.33 MIL/uL (ref 4.22–5.81)
RDW: 17.8 % — ABNORMAL HIGH (ref 11.5–15.5)
WBC: 4.2 10*3/uL (ref 4.0–10.5)
nRBC: 0 % (ref 0.0–0.2)
nRBC: 0 /100{WBCs}

## 2023-02-06 LAB — COMPREHENSIVE METABOLIC PANEL
ALT: 15 U/L (ref 0–44)
AST: 18 U/L (ref 15–41)
Albumin: 3.5 g/dL (ref 3.5–5.0)
Alkaline Phosphatase: 65 U/L (ref 38–126)
Anion gap: 9 (ref 5–15)
BUN: 11 mg/dL (ref 8–23)
CO2: 24 mmol/L (ref 22–32)
Calcium: 8.9 mg/dL (ref 8.9–10.3)
Chloride: 104 mmol/L (ref 98–111)
Creatinine, Ser: 1.26 mg/dL — ABNORMAL HIGH (ref 0.61–1.24)
GFR, Estimated: 57 mL/min — ABNORMAL LOW (ref >60)
Glucose, Bld: 150 mg/dL — ABNORMAL HIGH (ref 70–99)
Potassium: 3.8 mmol/L (ref 3.5–5.1)
Sodium: 137 mmol/L (ref 135–145)
Total Bilirubin: 0.4 mg/dL (ref 0.3–1.2)
Total Protein: 6.2 g/dL — ABNORMAL LOW (ref 6.5–8.1)

## 2023-02-06 LAB — LIPASE, BLOOD: Lipase: 29 U/L (ref 11–51)

## 2023-02-06 LAB — TROPONIN I (HIGH SENSITIVITY): Troponin I (High Sensitivity): 7 ng/L (ref <18)

## 2023-02-06 MED ORDER — SODIUM CHLORIDE 0.9 % IV BOLUS
500.0000 mL | Freq: Once | INTRAVENOUS | Status: AC
Start: 2023-02-06 — End: 2023-02-06
  Administered 2023-02-06: 500 mL via INTRAVENOUS

## 2023-02-06 NOTE — ED Triage Notes (Signed)
Pt to ED via [facility] from church. Pt has had a week of diarrhea. Today pt was singing and then got very dizzy, weak, and sweaty. Pt was assisted to sitting. Did not fall. Pt A&Ox4. 18g L hand. Pt denies pain, denies nausea or vomiting.   Sitting SBP=68 Lying SBP=98 NS en route Bs=180 100% RA Pacemaker, HR=70s

## 2023-02-06 NOTE — Discharge Instructions (Signed)
Return for any problem.  ?

## 2023-02-06 NOTE — ED Provider Notes (Signed)
[place] EMERGENCY DEPARTMENT AT [facility] Provider Note   CSN: [id number] Arrival date & time: 02/06/23  1045     History  Chief Complaint  Patient presents with   Diarrhea    Joel [name] is a 82 y.o. male.  82 year old male with prior medical history as detailed below presents for evaluation.  Patient reports feeling weak while at church today.  He reports that he nearly passed out.  He denies actual syncope.  He reports several days of diarrhea earlier this week.  He denies associated nausea or vomiting.  He denies abdominal pain.  He denies associated chest pain or shortness of breath.  Patient feels much improved after EMS transport and administration of IV fluids.  The history is provided by the patient and medical records.       Home Medications Prior to Admission medications   Medication Sig Start Date End Date Taking? Authorizing Provider  abiraterone acetate (ZYTIGA) 250 MG tablet Take 1,000 mg by mouth daily. On an empty stomach 02/17/18   [provider]  Accu-Chek FastClix Lancets MISC Test once daily 11/23/18   Henson, Vickie L, NP-C  albuterol (VENTOLIN HFA) 108 (90 Base) MCG/ACT inhaler Inhale 2 puffs into the lungs every 6 (six) hours as needed for wheezing or shortness of breath. 08/27/22   Tysinger, Kermit Balo, PA-C  amLODipine (NORVASC) 10 MG tablet TAKE 1 TABLET EVERY DAY 11/10/22   Kathleene Hazel, MD  aspirin EC 81 MG tablet Take 1 tablet (81 mg total) by mouth daily. Swallow whole. 03/17/22   Kathleene Hazel, MD  brimonidine (ALPHAGAN) 0.2 % ophthalmic solution Place 1 drop into both eyes 2 (two) times daily. 05/09/20   [provider]  diclofenac Sodium (VOLTAREN) 1 % GEL Apply 2 g topically 2 (two) times daily as needed (back pain). 06/16/20   [provider]  ferrous gluconate (FERGON) 324 MG tablet Take 1 tablet (324 mg total) by mouth daily with breakfast. 08/30/22   Tysinger, Kermit Balo, PA-C   gabapentin (NEURONTIN) 600 MG tablet Take 600 mg by mouth 2 (two) times daily.    [provider]  isosorbide mononitrate (IMDUR) 120 MG 24 hr tablet Take 1 tablet (120 mg total) by mouth daily. 02/19/22   Kathleene Hazel, MD  latanoprost (XALATAN) 0.005 % ophthalmic solution Place 1 drop into both eyes at bedtime. 02/28/20   [provider]  lidocaine (LIDODERM) 5 % Place 1 patch onto the skin daily. 10/26/22   [provider]  losartan-hydrochlorothiazide (HYZAAR) 50-12.5 MG tablet Take 1 tablet by mouth daily. 11/27/14   [provider]  nitroGLYCERIN (NITROSTAT) 0.4 MG SL tablet Place 1 tablet (0.4 mg total) under the tongue every 5 (five) minutes as needed for chest pain (max 3 doses). 02/19/22   Kathleene Hazel, MD  omeprazole (PRILOSEC) 20 MG capsule Take 20 mg by mouth daily.    [provider]  polyethylene glycol powder (GLYCOLAX/MIRALAX) 17 GM/SCOOP powder Take 17 g by mouth daily. 01/25/22   Tysinger, Kermit Balo, PA-C  pravastatin (PRAVACHOL) 40 MG tablet TAKE 1 TABLET EVERY EVENING 11/10/22   Kathleene Hazel, MD  predniSONE (DELTASONE) 5 MG tablet Take 5 mg by mouth in the morning. 07/17/20   [provider]  silver sulfADIAZINE (SILVADENE) 1 % cream Apply 1 Application topically daily. 01/10/23   Tysinger, Kermit Balo, PA-C  tamsulosin (FLOMAX) 0.4 MG CAPS capsule Take 0.4 mg by mouth daily.    [provider]  traMADol (ULTRAM) 50 MG tablet TAKE 1 TABLET(50 MG) BY MOUTH EVERY 12 HOURS AS NEEDED 06/25/22   Tysinger, Kermit Balo, PA-C      Allergies    Topiramate, Atenolol, Lisinopril, and Tramadol-acetaminophen    Review of Systems   Review of Systems  All other systems reviewed and are negative.   Physical Exam Updated Vital Signs BP 104/67 (BP Location: Right Arm)   Pulse 60   Temp (!) 97.5 F (36.4 C)   Resp 20   Ht 5\' 9"  (1.753 m)   Wt 108 kg   SpO2 98%   BMI 35.15 kg/m  Physical Exam Vitals  and nursing note reviewed.  Constitutional:      General: He is not in acute distress.    Appearance: Normal appearance. He is well-developed.  HENT:     Head: Normocephalic and atraumatic.  Eyes:     Conjunctiva/sclera: Conjunctivae normal.     Pupils: Pupils are equal, round, and reactive to light.  Cardiovascular:     Rate and Rhythm: Normal rate and regular rhythm.     Heart sounds: Normal heart sounds.  Pulmonary:     Effort: Pulmonary effort is normal. No respiratory distress.     Breath sounds: Normal breath sounds.  Abdominal:     General: There is no distension.     Palpations: Abdomen is soft.     Tenderness: There is no abdominal tenderness.  Musculoskeletal:        General: No deformity. Normal range of motion.     Cervical back: Normal range of motion and neck supple.  Skin:    General: Skin is warm and dry.  Neurological:     General: No focal deficit present.     Mental Status: He is alert and oriented to person, place, and time.     ED Results / Procedures / Treatments   Labs (all labs ordered are listed, but only abnormal results are displayed) Labs Reviewed  CBC WITH DIFFERENTIAL/PLATELET - Abnormal; Notable for the following components:      Result Value   Hemoglobin 9.0 (*)    HCT 30.5 (*)    MCV 70.4 (*)    MCH 20.8 (*)    MCHC 29.5 (*)    RDW 17.8 (*)    All other components within normal limits  COMPREHENSIVE METABOLIC PANEL - Abnormal; Notable for the following components:   Glucose, Bld 150 (*)    Creatinine, Ser 1.26 (*)    Total Protein 6.2 (*)    GFR, Estimated 57 (*)    All other components within normal limits  I-STAT CHEM 8, ED - Abnormal; Notable for the following components:   Glucose, Bld 149 (*)    Hemoglobin 10.2 (*)    HCT 30.0 (*)    All other components within normal limits  LIPASE, BLOOD  TROPONIN I (HIGH SENSITIVITY)    EKG None  Radiology DG Chest Port 1 View  Result Date: 02/06/2023 CLINICAL DATA:  Weakness.   One week history of diarrhea. EXAM: PORTABLE CHEST 1 VIEW COMPARISON:  01/31/2023 FINDINGS: Left chest wall pacer noted with leads in the right atrial appendage and right ventricle. Aortic atherosclerotic calcifications. Heart size is normal. There is no pleural fluid, interstitial edema or airspace disease. Chronic central airway thickening noted. No acute osseous findings. IMPRESSION: 1. No acute findings. 2. Chronic central airway thickening. Electronically Signed   By: Signa Kell M.D.   On: 02/06/2023 11:46  Procedures Procedures    Medications Ordered in ED Medications  sodium chloride 0.9 % bolus 500 mL (500 mLs Intravenous New Bag/Given 02/06/23 1108)    ED Course/ Medical Decision Making/ A&P                                 Medical Decision Making Amount and/or Complexity of Data Reviewed Labs: ordered. Radiology: ordered.    Medical Screen Complete  This patient presented to the ED with complaint of near syncope.  This complaint involves an extensive number of treatment options. The initial differential diagnosis includes, but is not limited to, dehydration, metabolic abnormality, etc.  This presentation is: Acute, Self-Limited, Previously Undiagnosed, Uncertain Prognosis, Complicated, Systemic Symptoms, and Threat to Life/Bodily Function  Patient reports near syncope while at church.  Given preceding several days of mild diarrhea suspect mild dehydration.  Screening labs are not suggestive of other significant acute pathology.  Patient does feel improved after IV fluids.  Patient understands need for close outpatient follow-up.  Strict return precautions given and understood.  Additional history obtained:  Additional history obtained from EMS External records from outside sources obtained and reviewed including prior ED visits and prior Inpatient records.    Lab Tests:  I ordered and personally interpreted labs.  The pertinent results include: CBC, CMP,  troponin, lipase, i-STAT Chem-8   Imaging Studies ordered:  I ordered imaging studies including chest x-ray I independently visualized and interpreted obtained imaging which showed NAD I agree with the radiologist interpretation.   Cardiac Monitoring:  The patient was maintained on a cardiac monitor.  I personally viewed and interpreted the cardiac monitor which showed an underlying rhythm of: NS are  Problem List / ED Course:  Near syncope, suspected mild dehydration   Reevaluation:  After the interventions noted above, I reevaluated the patient and found that they have: improved   Disposition:  After consideration of the diagnostic results and the patients response to treatment, I feel that the patent would benefit from close outpatient follow-up.          Final Clinical Impression(s) / ED Diagnoses Final diagnoses:  Diarrhea, unspecified type  Dehydration  Near syncope    Rx / DC Orders ED Discharge Orders     None         Wynetta Fines, MD 02/06/23 1643

## 2023-02-10 LAB — CUP PACEART INCLINIC DEVICE CHECK
Battery Remaining Longevity: 79 mo
Battery Voltage: 3.01 V
Brady Statistic RA Percent Paced: 87 %
Brady Statistic RV Percent Paced: 98 %
Date Time Interrogation Session: 20241023134700
Implantable Lead Connection Status: 753985
Implantable Lead Connection Status: 753985
Implantable Lead Implant Date: 20230525
Implantable Lead Implant Date: 20230525
Implantable Lead Location: 753859
Implantable Lead Location: 753860
Implantable Pulse Generator Implant Date: 20230525
Lead Channel Impedance Value: 400 Ohm
Lead Channel Impedance Value: 487.5 Ohm
Lead Channel Pacing Threshold Amplitude: 0.75 V
Lead Channel Pacing Threshold Amplitude: 0.75 V
Lead Channel Pacing Threshold Amplitude: 0.75 V
Lead Channel Pacing Threshold Amplitude: 0.75 V
Lead Channel Pacing Threshold Pulse Width: 0.5 ms
Lead Channel Pacing Threshold Pulse Width: 0.5 ms
Lead Channel Pacing Threshold Pulse Width: 0.5 ms
Lead Channel Pacing Threshold Pulse Width: 0.5 ms
Lead Channel Sensing Intrinsic Amplitude: 5 mV
Lead Channel Sensing Intrinsic Amplitude: 5.5 mV
Lead Channel Setting Pacing Amplitude: 2 V
Lead Channel Setting Pacing Amplitude: 2.5 V
Lead Channel Setting Pacing Pulse Width: 0.5 ms
Lead Channel Setting Sensing Sensitivity: 2.5 mV
Pulse Gen Model: 2272
Pulse Gen Serial Number: 8084442

## 2023-02-14 ENCOUNTER — Ambulatory Visit: Payer: Medicare HMO | Admitting: Medical

## 2023-02-15 ENCOUNTER — Ambulatory Visit (HOSPITAL_COMMUNITY): Payer: Medicare HMO

## 2023-02-15 ENCOUNTER — Encounter (HOSPITAL_COMMUNITY): Payer: Self-pay

## 2023-02-16 ENCOUNTER — Other Ambulatory Visit: Payer: Self-pay | Admitting: Cardiovascular Disease

## 2023-02-16 DIAGNOSIS — I25110 Atherosclerotic heart disease of native coronary artery with unstable angina pectoris: Secondary | ICD-10-CM

## 2023-02-17 ENCOUNTER — Ambulatory Visit (INDEPENDENT_AMBULATORY_CARE_PROVIDER_SITE_OTHER): Payer: Medicare HMO | Admitting: Otolaryngology

## 2023-02-17 ENCOUNTER — Encounter (INDEPENDENT_AMBULATORY_CARE_PROVIDER_SITE_OTHER): Payer: Self-pay

## 2023-02-17 VITALS — Ht 69.0 in | Wt 238.0 lb

## 2023-02-17 DIAGNOSIS — C8290 Follicular lymphoma, unspecified, unspecified site: Secondary | ICD-10-CM

## 2023-02-17 DIAGNOSIS — K118 Other diseases of salivary glands: Secondary | ICD-10-CM | POA: Diagnosis not present

## 2023-02-17 DIAGNOSIS — C8591 Non-Hodgkin lymphoma, unspecified, lymph nodes of head, face, and neck: Secondary | ICD-10-CM

## 2023-02-17 NOTE — Progress Notes (Signed)
Dear Dr. Aleen Campi, Here is my assessment for our mutual patient, Joel [name]. Thank you for allowing me the opportunity to care for your patient. Please do not hesitate to contact me should you have any other questions. Sincerely, Dr. Jovita Kussmaul  Otolaryngology Clinic Note Referring provider: Dr. Aleen Campi HPI:  Joel [name] is a 82 y.o. male kindly referred by Dr. Aleen Campi for evaluation of left parotid mass.   He previously saw Dr. Lou Cal in 2022 for same complaint while undergoing treatment. From her note:  An FNA of the Left Parotid lesion was done, which was consistent with Lymphoma. Given ongoing treatment, decision was made to observe and he was then lost to follow up it appears.  The mass was again noted on CT Angio 01/2023 done for TIA, and he returns today for further evaluation  Patient reports that he has not noticed it still. No trouble with PO intake, no neck masses, no odynophagia, dysphagia, skin cancer. Denies B symptoms.  Smoked long time ago - stopped 2008 (50 pack year)  H&N Surgery: denies Personal or FHx of bleeding dz or anesthesia difficulty: no  AP/AC: on ASA 81  PMHx: HTN, PAD, CAD, Heart Block (s/p PPM), NSVT, A-fib, Hormone resistant prostate cancer, CAD s/p stent placement in 2014, PAD s/p left poplitieal-tibial bypass, HTN, HLD, CKD II, COPD, diabetes, and low grade B-cell lymphoma.   Independent Review of Additional Tests or Records:   [facility] On[facility] note reviewed (Dr. Lavonia Dana 01/05/2023)   Path (2022):    CT Angio 01/2023: similar size parotid lymph node on left as what appears to be PET avid in 2022 (see below); no other obvious large lymphadenopathy noted but modality is not optimal for eval  PET 2022:  left hypermetabolic intraparotid lymph node      PMH/Meds/All/SocHx/FamHx/ROS:   Past Medical History:  Diagnosis Date   Alpha thalassemia trait    Atypical chest pain    CAD (coronary artery disease)    a. Multiple prior  evaluations then 05/2012 s/p BMS to OM2. b. Nuc 11/2012 wnl. c. Cath 2015: patent stent, moderate LAD/RCA dz, treated medically.   Cataract    CHEST PAIN UNSPECIFIED 09/13/2008   Qualifier: Diagnosis of  By: Trevor Iha, RN, Heather     CKD (chronic kidney disease), stage II    COPD (chronic obstructive pulmonary disease) (HCC)    Cough secondary to angiotensin converting enzyme inhibitor (ACE-I)    Diabetes mellitus (HCC) 09/07/2019   Diabetes mellitus without complication (HCC)    Diverticulosis    DVT (deep venous thrombosis) (HCC)    Family history of breast cancer in first degree relative    GERD (gastroesophageal reflux disease)    Headache(784.0)    Hx: of   History of blood transfusion    "I've had 2; don't know what it was related to" (11/09/2012)   HLD (hyperlipidemia)    HTN (hypertension)    Iron deficiency anemia    Lymphoproliferative disorder, low grade B cell (HCC) 05/21/2020   Microcytic anemia    PAF (paroxysmal atrial fibrillation) (HCC)    Peripheral vascular disease (HCC)    a. L pop-tibial bypass 2011, followed by [facility].   Personal history of prostate cancer    Presence of permanent cardiac pacemaker    Rotator cuff injury    "left arm; never repaired" (11/09/2012)     Past Surgical History:  Procedure Laterality Date   CARDIOVASCULAR STRESS TEST  Aug. 2014   CATARACT EXTRACTION W/ INTRAOCULAR LENS IMPLANT Right ~  2008   CIRCUMCISION     COLONOSCOPY     CORONARY ANGIOPLASTY WITH STENT PLACEMENT  2014   "1" (11/09/2012)   ILIAC ARTERY ANEURYSM REPAIR     LEFT ATRIAL APPENDAGE OCCLUSION N/A 09/02/2022   Procedure: LEFT ATRIAL APPENDAGE OCCLUSION;  Surgeon: Lanier Prude, MD;  Location: [facility];  Service: Cardiovascular;  Laterality: N/A;   LEFT HEART CATH  05-31-12   LEFT HEART CATH AND CORONARY ANGIOGRAPHY N/A 08/01/2019   Procedure: LEFT HEART CATH AND CORONARY ANGIOGRAPHY;  Surgeon: Kathleene Hazel, MD;  Location: [facility];   Service: Cardiovascular;  Laterality: N/A;   LEFT HEART CATHETERIZATION WITH CORONARY ANGIOGRAM N/A 06/01/2011   Procedure: LEFT HEART CATHETERIZATION WITH CORONARY ANGIOGRAM;  Surgeon: Herby Abraham, MD;  Location: [facility] CATH LAB;  Service: Cardiovascular;  Laterality: N/A;   LEFT HEART CATHETERIZATION WITH CORONARY ANGIOGRAM N/A 05/31/2012   Procedure: LEFT HEART CATHETERIZATION WITH CORONARY ANGIOGRAM;  Surgeon: Peter M Swaziland, MD;  Location: [facility] CATH LAB;  Service: Cardiovascular;  Laterality: N/A;   LEFT HEART CATHETERIZATION WITH CORONARY ANGIOGRAM N/A 08/01/2013   Procedure: LEFT HEART CATHETERIZATION WITH CORONARY ANGIOGRAM;  Surgeon: Kathleene Hazel, MD;  Location: [facility] CATH LAB;  Service: Cardiovascular;  Laterality: N/A;   PACEMAKER IMPLANT N/A 09/03/2021   Procedure: PACEMAKER IMPLANT;  Surgeon: Marinus Maw, MD;  Location: [facility];  Service: Cardiovascular;  Laterality: N/A;   PERCUTANEOUS CORONARY STENT INTERVENTION (PCI-S)  05/31/2012   Procedure: PERCUTANEOUS CORONARY STENT INTERVENTION (PCI-S);  Surgeon: Peter M Swaziland, MD;  Location: [facility] CATH LAB;  Service: Cardiovascular;;   PR VEIN BYPASS GRAFT,AORTO-FEM-POP Left 10/01/2009   left below knee popliteal artery to posterior tibial artery   SHOULDER ARTHROSCOPY WITH OPEN ROTATOR CUFF REPAIR AND DISTAL CLAVICLE ACROMINECTOMY Left 01/12/2013   Procedure: LEFT SHOULDER ARTHROSCOPY WITH DEBRIDEMENT OPEN DISTAL CLAVICLE RESECTION ,acromioplastyAND ROTATOR CUFF REPAIR;  Surgeon: Thera Flake., MD;  Location: [facility] OR;  Service: Orthopedics;  Laterality: Left;   TEE WITHOUT CARDIOVERSION N/A 09/02/2022   Procedure: TRANSESOPHAGEAL ECHOCARDIOGRAM;  Surgeon: Lanier Prude, MD;  Location: [facility] INVASIVE CV LAB;  Service: Cardiovascular;  Laterality: N/A;    Family History  Problem Relation Age of Onset   Breast cancer Mother        dx 19s   Hyperlipidemia Mother    Hypertension Mother    COPD Father        deceased    Hyperlipidemia Father    Hypertension Father    Hypertension Sister    Cancer Brother 47       Unknown cancer, possibly stomach   Hypertension Brother    Cancer Brother 6       Unknown, possibly lung   Hypertension Daughter    Hypertension Son    Colon cancer Neg Hx        pt is unclear about family hx     Social Connections: Unknown (08/21/2021)   Received [facility]   Social Network    Social Network: Not on file      Current Outpatient Medications:    abiraterone acetate (ZYTIGA) 250 MG tablet, Take 1,000 mg by mouth daily. On an empty stomach, Disp: , Rfl:    Accu-Chek FastClix Lancets MISC, Test once daily, Disp: 102 each, Rfl: 12   albuterol (VENTOLIN HFA) 108 (90 Base) MCG/ACT inhaler, Inhale 2 puffs into the lungs every 6 (six) hours as needed for wheezing or shortness  of breath., Disp: 54 g, Rfl: 0   amLODipine (NORVASC) 10 MG tablet, TAKE 1 TABLET EVERY DAY, Disp: 90 tablet, Rfl: 3   aspirin EC 81 MG tablet, Take 1 tablet (81 mg total) by mouth daily. Swallow whole., Disp: 90 tablet, Rfl: 3   brimonidine (ALPHAGAN) 0.2 % ophthalmic solution, Place 1 drop into both eyes 2 (two) times daily., Disp: , Rfl:    diclofenac Sodium (VOLTAREN) 1 % GEL, Apply 2 g topically 2 (two) times daily as needed (back pain)., Disp: , Rfl:    gabapentin (NEURONTIN) 600 MG tablet, Take 600 mg by mouth 2 (two) times daily., Disp: , Rfl:    isosorbide mononitrate (IMDUR) 120 MG 24 hr tablet, TAKE 1 TABLET EVERY DAY, Disp: 90 tablet, Rfl: 3   latanoprost (XALATAN) 0.005 % ophthalmic solution, Place 1 drop into both eyes at bedtime., Disp: , Rfl:    nitroGLYCERIN (NITROSTAT) 0.4 MG SL tablet, Place 1 tablet (0.4 mg total) under the tongue every 5 (five) minutes as needed for chest pain (max 3 doses)., Disp: 75 tablet, Rfl: 2   omeprazole (PRILOSEC) 20 MG capsule, Take 20 mg by mouth daily., Disp: , Rfl:    pravastatin (PRAVACHOL) 40 MG tablet, TAKE 1 TABLET EVERY EVENING,  Disp: 90 tablet, Rfl: 3   predniSONE (DELTASONE) 5 MG tablet, Take 5 mg by mouth in the morning., Disp: , Rfl:    silver sulfADIAZINE (SILVADENE) 1 % cream, Apply 1 Application topically daily., Disp: 50 g, Rfl: 0   tamsulosin (FLOMAX) 0.4 MG CAPS capsule, Take 0.4 mg by mouth daily., Disp: , Rfl:    traMADol (ULTRAM) 50 MG tablet, TAKE 1 TABLET(50 MG) BY MOUTH EVERY 12 HOURS AS NEEDED, Disp: 30 tablet, Rfl: 0   ferrous gluconate (FERGON) 324 MG tablet, Take 1 tablet (324 mg total) by mouth daily with breakfast. (Patient not taking: Reported on 02/17/2023), Disp: 30 tablet, Rfl: 2   lidocaine (LIDODERM) 5 %, Place 1 patch onto the skin daily. (Patient not taking: Reported on 02/17/2023), Disp: , Rfl:    losartan-hydrochlorothiazide (HYZAAR) 50-12.5 MG tablet, Take 1 tablet by mouth daily. (Patient not taking: Reported on 02/17/2023), Disp: , Rfl:    polyethylene glycol powder (GLYCOLAX/MIRALAX) 17 GM/SCOOP powder, Take 17 g by mouth daily. (Patient not taking: Reported on 02/17/2023), Disp: 289 g, Rfl: 0   Physical Exam:   Ht 5\' 9"  (1.753 m)   Wt 238 lb (108 kg)   BMI 35.15 kg/m   Salient findings:  CN II-XII intact  Bilateral EAC clear and TM intact with well pneumatized middle ear spaces No obvious skin lesions concerning for skin cancer or scars on face (patient denies as well) Anterior rhinoscopy: Septum relatively midline, no masses noted No lesions of oral cavity/oropharynx; able to express clear saliva both parotid ducts No parotid nodules or submandibular masses able to be appreciated No obviously palpable neck masses/lymphadenopathy/thyromegaly No respiratory distress or stridor  Seprately Identifiable Procedures:  None  Impression & Plans:  Joel [name] is a 82 y.o. male with history of Follicular Lymphoma and metastatic prostate cancer now presenting for: Bilateral (L > R) parotid lesions - identified on PET and worked up in 2022, showing most likely lymphoma given flow and  FNA. Noted again on CT most recently. I am unable to find a PET more recent, and still undergoing treatment with Rituximab with palliative intent - He is completely asymptomatic, and CT does not appear to show marked increase in size. He is not  having any B-symptoms - Given prior workup, DDX most likely indicates Lymphoma > benign parotid etiology ([place]) v/s malignancy (primary or metastatic from skin). These do not appear to be as likely as lymphoma. - As such, given [facility] in [facility]/2023 and after discussion with patient regarding options including re-FNA, we will first obtain [facility] and discuss with Dr. Elwyn Lade office. - If there is plan for a PET, we can wait for that. If Dr. Elwyn Lade office wishes for repeat FNA which seems reasonable given unable to definitively determine if lymphoma last time, will go ahead and do so. If wish for open bx, can consider that as well for definitive dx if FNA shows lymphoid material though unclear utility if palliative; if epithelial or pleo (unlikely now), can consider removal - F/u in 6 months for recheck regardless, sooner if any concerns.   Thank you for allowing me the opportunity to care for your patient. Please do not hesitate to contact me should you have any other questions.  Sincerely, Jovita Kussmaul, MD Otolarynoglogist (ENT), [facility] Phone: [phone] Fax: [phone]  02/17/2023, 2:31 PM   MDM:  Level 4 Complexity/Problems addressed: high - lymphoma Data complexity: high independent review of PET, biopsies and other lab results - Morbidity: mod

## 2023-02-21 ENCOUNTER — Telehealth: Payer: Self-pay | Admitting: Cardiovascular Disease

## 2023-02-21 ENCOUNTER — Other Ambulatory Visit: Payer: Self-pay | Admitting: Cardiovascular Disease

## 2023-02-21 DIAGNOSIS — I25110 Atherosclerotic heart disease of native coronary artery with unstable angina pectoris: Secondary | ICD-10-CM

## 2023-02-21 MED ORDER — ISOSORBIDE MONONITRATE ER 120 MG PO TB24: 120.0000 mg | ORAL_TABLET | Freq: Every day | ORAL | 3 refills | Status: AC

## 2023-02-21 MED ORDER — LOSARTAN POTASSIUM-HCTZ 50-12.5 MG PO TABS: 1.0000 | ORAL_TABLET | Freq: Every day | ORAL | 3 refills | Status: DC

## 2023-02-21 NOTE — Telephone Encounter (Signed)
Pt c/o medication issue:  1. Name of Medication: Losartan and Isosorbide  2. How are you currently taking this medication (dosage and times per day)?   3. Are you having a reaction (difficulty breathing--STAT)?   4. What is your medication issue? Patient wants to know which medicine is he supposed to be taking?

## 2023-02-21 NOTE — Telephone Encounter (Signed)
*  STAT* If patient is at the pharmacy, call can be transferred to refill team.   1. Which medications need to be refilled? (please list name of each medication and dose if known)losartan-hydrochlorothiazide (HYZAAR) 50-12.5 MG tablet  2. Which pharmacy/location (including street and city if local pharmacy) is medication to be sent to? [facility] [phone] - [place], [place] - [address] Phone: [phone]  Fax: [phone]      3. Do they need a 30 day or 90 day supply?  Pt has ordered med through [facility] but he is completely out. Wants to know if we can call a few tablets in til he receives it from [facility]. Please advise

## 2023-02-21 NOTE — Telephone Encounter (Signed)
I spoke w the patient and reviewed the chart.  The patient does not take Hyzaar 50-12.5 mg.  He is not on this at all.   It was added back to the med list during an ER visit for an abscess in October.  It carried over to the office visit w Dr. Ladona Ridgel and then was refilled based on that today.  Informed patient that he should not take this medication.    Also advised that the isosorbide was sent to pharmacy and to follow up with them later this afternoon.  Pt grateful for assistance.

## 2023-02-21 NOTE — Telephone Encounter (Signed)
*  STAT* If patient is at the pharmacy, call can be transferred to refill team.   1. Which medications need to be refilled? (please list name of each medication and dose if known) isosorbide mononitrate (IMDUR) 120 MG 24 hr tablet   2. Would you like to learn more about the convenience, safety, & potential cost savings by using the [facility]? No      3. Are you open to using the [facility] (Type [facility]. No ).   4. Which pharmacy/location (including street and city if local pharmacy) is medication to be sent to? [facility] [phone] - [place], [place] - [address]    5. Do they need a 30 day or 90 day supply? 90

## 2023-02-21 NOTE — Addendum Note (Signed)
Addended by: Lendon Ka on: 02/21/2023 05:11 PM   Modules accepted: Orders

## 2023-02-23 ENCOUNTER — Telehealth (INDEPENDENT_AMBULATORY_CARE_PROVIDER_SITE_OTHER): Payer: Self-pay | Admitting: Otolaryngology

## 2023-02-23 NOTE — Telephone Encounter (Signed)
ENT Telephone note: I discussed Joel [name]'s case with Dr. Elwyn Lade office (the fellow). She reports that he is currently under remission and intent is palliative. He is scheduled for a repeat scans in December, and given prior biopsies, they would not recommend any further FNAs of the parotid mass for diagnostic purposes. Should they wish to do so, I reported that I am happy to do so. We discussed follow up, and Dr. Elwyn Lade office has graciously agreed to take over his care and will contact me should they wish for any further intervention regarding the left neck node over tail of parotid. I have already discussed with Mr. Shallow regarding return precautions

## 2023-02-28 ENCOUNTER — Ambulatory Visit (INDEPENDENT_AMBULATORY_CARE_PROVIDER_SITE_OTHER): Payer: Medicare HMO | Admitting: Medical

## 2023-02-28 VITALS — BP 110/62 | HR 61 | Temp 97.2°F | Wt 230.4 lb

## 2023-02-28 DIAGNOSIS — J988 Other specified respiratory disorders: Secondary | ICD-10-CM | POA: Diagnosis not present

## 2023-02-28 DIAGNOSIS — R058 Other specified cough: Secondary | ICD-10-CM | POA: Diagnosis not present

## 2023-02-28 MED ORDER — BENZONATATE 200 MG PO CAPS
200.0000 mg | ORAL_CAPSULE | Freq: Three times a day (TID) | ORAL | 0 refills | Status: DC | PRN
Start: 2023-02-28 — End: 2023-03-22

## 2023-02-28 MED ORDER — AMOXICILLIN 875 MG PO TABS: 875.0000 mg | ORAL_TABLET | Freq: Two times a day (BID) | ORAL | 0 refills | Status: AC

## 2023-02-28 NOTE — Patient Instructions (Signed)
Respiratory tract infection, productive cough  Recommendations: Increase your water intake as you look a little dehydrated today Begin amoxicillin antibiotic twice a day for 10 days Begin Tessalon Perle cough drops up to 3 times a day as needed You can additionally use some over-the-counter Mucinex twice a day to help with mucus You can use Tylenol for fever not feeling well if needed If not much improved by Thursday or Friday and let me know

## 2023-02-28 NOTE — Progress Notes (Signed)
Subjective:  Joel [name] is a 82 y.o. male who presents for Chief Complaint  Patient presents with   Cough    Cough x 3 weeks. Taking Claritin D, started taking mucinex yesterday and has some better, very fatigue     Here for cough, cold symptoms, getting out thick yellow mucous.   No sore throat, no fever, no body ache or chills, no NVD, no ear pain.  Was using some alka seltzer.  Switched to using some mucinex.   No sick contacts.  No other aggravating or relieving factors.    No other c/o.  Past Medical History:  Diagnosis Date   Alpha thalassemia trait    Atypical chest pain    CAD (coronary artery disease)    a. Multiple prior evaluations then 05/2012 s/p BMS to OM2. b. Nuc 11/2012 wnl. c. Cath 2015: patent stent, moderate LAD/RCA dz, treated medically.   Cataract    CHEST PAIN UNSPECIFIED 09/13/2008   Qualifier: Diagnosis of  By: Trevor Iha, RN, Heather     CKD (chronic kidney disease), stage II    COPD (chronic obstructive pulmonary disease) (HCC)    Cough secondary to angiotensin converting enzyme inhibitor (ACE-I)    Diabetes mellitus (HCC) 09/07/2019   Diabetes mellitus without complication (HCC)    Diverticulosis    DVT (deep venous thrombosis) (HCC)    Family history of breast cancer in first degree relative    GERD (gastroesophageal reflux disease)    Headache(784.0)    Hx: of   History of blood transfusion    "I've had 2; don't know what it was related to" (11/09/2012)   HLD (hyperlipidemia)    HTN (hypertension)    Iron deficiency anemia    Lymphoproliferative disorder, low grade B cell (HCC) 05/21/2020   Microcytic anemia    PAF (paroxysmal atrial fibrillation) (HCC)    Peripheral vascular disease (HCC)    a. L pop-tibial bypass 2011, followed by [facility].   Personal history of prostate cancer    Presence of permanent cardiac pacemaker    Rotator cuff injury    "left arm; never repaired" (11/09/2012)   Current Outpatient Medications on File Prior to Visit   Medication Sig Dispense Refill   abiraterone acetate (ZYTIGA) 250 MG tablet Take 1,000 mg by mouth daily. On an empty stomach     albuterol (VENTOLIN HFA) 108 (90 Base) MCG/ACT inhaler Inhale 2 puffs into the lungs every 6 (six) hours as needed for wheezing or shortness of breath. 54 g 0   amLODipine (NORVASC) 10 MG tablet TAKE 1 TABLET EVERY DAY 90 tablet 3   aspirin EC 81 MG tablet Take 1 tablet (81 mg total) by mouth daily. Swallow whole. 90 tablet 3   diclofenac Sodium (VOLTAREN) 1 % GEL Apply 2 g topically 2 (two) times daily as needed (back pain).     gabapentin (NEURONTIN) 600 MG tablet Take 600 mg by mouth 2 (two) times daily.     isosorbide mononitrate (IMDUR) 120 MG 24 hr tablet Take 1 tablet (120 mg total) by mouth daily. 90 tablet 3   latanoprost (XALATAN) 0.005 % ophthalmic solution Place 1 drop into both eyes at bedtime.     lidocaine (LIDODERM) 5 % Place 1 patch onto the skin daily.     omeprazole (PRILOSEC) 20 MG capsule Take 20 mg by mouth daily.     polyethylene glycol powder (GLYCOLAX/MIRALAX) 17 GM/SCOOP powder Take 17 g by mouth daily. 289 g 0   pravastatin (PRAVACHOL) 40  MG tablet TAKE 1 TABLET EVERY EVENING 90 tablet 3   predniSONE (DELTASONE) 5 MG tablet Take 5 mg by mouth in the morning.     silver sulfADIAZINE (SILVADENE) 1 % cream Apply 1 Application topically daily. 50 g 0   tamsulosin (FLOMAX) 0.4 MG CAPS capsule Take 0.4 mg by mouth daily.     traMADol (ULTRAM) 50 MG tablet TAKE 1 TABLET(50 MG) BY MOUTH EVERY 12 HOURS AS NEEDED 30 tablet 0   Accu-Chek FastClix Lancets MISC Test once daily 102 each 12   brimonidine (ALPHAGAN) 0.2 % ophthalmic solution Place 1 drop into both eyes 2 (two) times daily.     ferrous gluconate (FERGON) 324 MG tablet Take 1 tablet (324 mg total) by mouth daily with breakfast. (Patient not taking: Reported on 02/17/2023) 30 tablet 2   nitroGLYCERIN (NITROSTAT) 0.4 MG SL tablet Place 1 tablet (0.4 mg total) under the tongue every 5 (five)  minutes as needed for chest pain (max 3 doses). 75 tablet 2   No current facility-administered medications on file prior to visit.     The following portions of the patient's history were reviewed and updated as appropriate: allergies, current medications, past family history, past medical history, past social history, past surgical history and problem list.  ROS Otherwise as in subjective above  Objective: BP 110/62   Pulse 61   Temp (!) 97.2 F (36.2 C)   Wt 230 lb 6.4 oz (104.5 kg)   BMI 34.02 kg/m   General appearance: alert, no distress, well developed, well nourished HEENT: normocephalic, sclerae anicteric, conjunctiva pink and moist, TMs flat, decreased light reflex, nares with mucoid discharge, +erythema, pharynx WNL Oral cavity: somewhat dryMM, no lesions Neck: supple, no lymphadenopathy, no thyromegaly, no masses Heart: RRR, normal S1, S2, no murmurs Lungs: CTA bilaterally, no wheezes, rhonchi, or rales    Assessment: Encounter Diagnoses  Name Primary?   Productive cough Yes   Respiratory tract infection      Plan: Respiratory tract infection, productive cough  Recommendations: Increase your water intake as you look a little dehydrated today Begin amoxicillin antibiotic twice a day for 10 days Begin Tessalon Perle cough drops up to 3 times a day as needed You can additionally use some over-the-counter Mucinex twice a day to help with mucus You can use Tylenol for fever not feeling well if needed If not much improved by Thursday or Friday and let me know   Si was seen today for cough.  Diagnoses and all orders for this visit:  Productive cough  Respiratory tract infection  Other orders -     amoxicillin (AMOXIL) 875 MG tablet; Take 1 tablet (875 mg total) by mouth 2 (two) times daily for 10 days. -     benzonatate (TESSALON) 200 MG capsule; Take 1 capsule (200 mg total) by mouth 3 (three) times daily as needed for cough.    Follow up:  prn

## 2023-03-04 ENCOUNTER — Emergency Department (HOSPITAL_COMMUNITY): Payer: Medicare HMO

## 2023-03-04 ENCOUNTER — Encounter (HOSPITAL_COMMUNITY): Payer: Self-pay

## 2023-03-04 ENCOUNTER — Emergency Department (HOSPITAL_COMMUNITY)
Admission: EM | Admit: 2023-03-04 | Discharge: 2023-03-05 | Disposition: A | Discharge status: Home / Self Care | Payer: Medicare HMO | Attending: Emergency Medicine | Admitting: Emergency Medicine

## 2023-03-04 DIAGNOSIS — R0602 Shortness of breath: Secondary | ICD-10-CM | POA: Diagnosis not present

## 2023-03-04 DIAGNOSIS — J449 Chronic obstructive pulmonary disease, unspecified: Secondary | ICD-10-CM | POA: Diagnosis not present

## 2023-03-04 DIAGNOSIS — E1122 Type 2 diabetes mellitus with diabetic chronic kidney disease: Secondary | ICD-10-CM | POA: Diagnosis not present

## 2023-03-04 DIAGNOSIS — J40 Bronchitis, not specified as acute or chronic: Secondary | ICD-10-CM | POA: Insufficient documentation

## 2023-03-04 DIAGNOSIS — I2510 Atherosclerotic heart disease of native coronary artery without angina pectoris: Secondary | ICD-10-CM | POA: Insufficient documentation

## 2023-03-04 DIAGNOSIS — Z20822 Contact with and (suspected) exposure to covid-19: Secondary | ICD-10-CM | POA: Diagnosis not present

## 2023-03-04 DIAGNOSIS — R059 Cough, unspecified: Secondary | ICD-10-CM | POA: Diagnosis not present

## 2023-03-04 DIAGNOSIS — Z950 Presence of cardiac pacemaker: Secondary | ICD-10-CM | POA: Diagnosis not present

## 2023-03-04 DIAGNOSIS — I129 Hypertensive chronic kidney disease with stage 1 through stage 4 chronic kidney disease, or unspecified chronic kidney disease: Secondary | ICD-10-CM | POA: Insufficient documentation

## 2023-03-04 DIAGNOSIS — N182 Chronic kidney disease, stage 2 (mild): Secondary | ICD-10-CM | POA: Diagnosis not present

## 2023-03-04 NOTE — ED Triage Notes (Signed)
Pt is coming in for general illness, mostly concerned with the cough and shortness of breath he has been experiencing, he mentions it started off as a cold and he went to his PCP who gave him some medications to take but it has not been improving. He mentions he has some rattling in his chest, mild shortness of breath, coughing up thick yellow sputum. No chest pain at this time.

## 2023-03-05 DIAGNOSIS — R0602 Shortness of breath: Secondary | ICD-10-CM | POA: Diagnosis not present

## 2023-03-05 DIAGNOSIS — Z950 Presence of cardiac pacemaker: Secondary | ICD-10-CM | POA: Diagnosis not present

## 2023-03-05 DIAGNOSIS — R059 Cough, unspecified: Secondary | ICD-10-CM | POA: Diagnosis not present

## 2023-03-05 LAB — COMPREHENSIVE METABOLIC PANEL
ALT: 11 U/L (ref 0–44)
AST: 17 U/L (ref 15–41)
Albumin: 3.5 g/dL (ref 3.5–5.0)
Alkaline Phosphatase: 82 U/L (ref 38–126)
Anion gap: 7 (ref 5–15)
BUN: 10 mg/dL (ref 8–23)
CO2: 25 mmol/L (ref 22–32)
Calcium: 8.9 mg/dL (ref 8.9–10.3)
Chloride: 103 mmol/L (ref 98–111)
Creatinine, Ser: 1.02 mg/dL (ref 0.61–1.24)
GFR, Estimated: >60 mL/min (ref >60)
Glucose, Bld: 120 mg/dL — ABNORMAL HIGH (ref 70–99)
Potassium: 3.5 mmol/L (ref 3.5–5.1)
Sodium: 135 mmol/L (ref 135–145)
Total Bilirubin: 0.2 mg/dL (ref <1.2)
Total Protein: 6.6 g/dL (ref 6.5–8.1)

## 2023-03-05 LAB — CBC
HCT: 31.5 % — ABNORMAL LOW (ref 39.0–52.0)
Hemoglobin: 9.5 g/dL — ABNORMAL LOW (ref 13.0–17.0)
MCH: 21 pg — ABNORMAL LOW (ref 26.0–34.0)
MCHC: 30.2 g/dL (ref 30.0–36.0)
MCV: 69.7 fL — ABNORMAL LOW (ref 80.0–100.0)
Platelets: 219 10*3/uL (ref 150–400)
RBC: 4.52 MIL/uL (ref 4.22–5.81)
RDW: 17.1 % — ABNORMAL HIGH (ref 11.5–15.5)
WBC: 4.5 10*3/uL (ref 4.0–10.5)
nRBC: 0 % (ref 0.0–0.2)

## 2023-03-05 LAB — RESP PANEL BY RT-PCR (RSV, FLU A&B, COVID)  RVPGX2
Influenza A by PCR: NEGATIVE
Influenza B by PCR: NEGATIVE
Resp Syncytial Virus by PCR: NEGATIVE
SARS Coronavirus 2 by RT PCR: NEGATIVE

## 2023-03-05 MED ORDER — DEXAMETHASONE 4 MG PO TABS
8.0000 mg | ORAL_TABLET | Freq: Once | ORAL | Status: AC
Start: 2023-03-05 — End: 2023-03-05
  Administered 2023-03-05: 8 mg via ORAL
  Filled 2023-03-05: qty 2

## 2023-03-05 MED ORDER — ALBUTEROL SULFATE HFA 108 (90 BASE) MCG/ACT IN AERS
2.0000 | INHALATION_SPRAY | Freq: Once | RESPIRATORY_TRACT | Status: AC
  Administered 2023-03-05: 2 via RESPIRATORY_TRACT
  Filled 2023-03-05: qty 6.7

## 2023-03-05 NOTE — Discharge Instructions (Signed)
I am refilling your albuterol inhaler. You may use this up to once every 6 hours as needed for trouble breathing and severe cough. Continue your antibiotics from your primary care doctor. Return with any new or suddenly worsening symptoms.

## 2023-03-05 NOTE — ED Provider Notes (Signed)
Emergency Department Provider Note   I have reviewed the triage vital signs and the nursing notes.   HISTORY  Chief Complaint Shortness of Breath   HPI Joel [name] is a 82 y.o. male patient presents to the emergency department for evaluation of shortness of breath. {**SYMPTOM/COMPLAINT  LOCATION (describe anatomically) DURATION (when did it start) TIMING (onset and pattern) SEVERITY (0-10, mild/moderate/severe) QUALITY (description of symptoms) CONTEXT (recent surgery, new meds, activity, etc.) MODIFYINGFACTORS (what makes it better/worse) ASSOCIATEDSYMPTOMS (pertinent positives and negatives)**}  Past Medical History:  Diagnosis Date   Alpha thalassemia trait    Atypical chest pain    CAD (coronary artery disease)    a. Multiple prior evaluations then 05/2012 s/p BMS to OM2. b. Nuc 11/2012 wnl. c. Cath 2015: patent stent, moderate LAD/RCA dz, treated medically.   Cataract    CHEST PAIN UNSPECIFIED 09/13/2008   Qualifier: Diagnosis of  By: Trevor Iha, RN, Heather     CKD (chronic kidney disease), stage II    COPD (chronic obstructive pulmonary disease) (HCC)    Cough secondary to angiotensin converting enzyme inhibitor (ACE-I)    Diabetes mellitus (HCC) 09/07/2019   Diabetes mellitus without complication (HCC)    Diverticulosis    DVT (deep venous thrombosis) (HCC)    Family history of breast cancer in first degree relative    GERD (gastroesophageal reflux disease)    Headache(784.0)    Hx: of   History of blood transfusion    "I've had 2; don't know what it was related to" (11/09/2012)   HLD (hyperlipidemia)    HTN (hypertension)    Iron deficiency anemia    Lymphoproliferative disorder, low grade B cell (HCC) 05/21/2020   Microcytic anemia    PAF (paroxysmal atrial fibrillation) (HCC)    Peripheral vascular disease (HCC)    a. L pop-tibial bypass 2011, followed by [facility].   Personal history of prostate cancer    Presence of permanent cardiac pacemaker     Rotator cuff injury    "left arm; never repaired" (11/09/2012)    Review of Systems {** Revise as appropriate then delete this line - Documentation of 10 systems OR 2 systems and "10-point ROS otherwise negative" is required **}Constitutional: No fever/chills Eyes: No visual changes. ENT: No sore throat. Cardiovascular: Denies chest pain. Respiratory: Denies shortness of breath. Gastrointestinal: No abdominal pain.  No nausea, no vomiting.  No diarrhea.  No constipation. Genitourinary: Negative for dysuria. Musculoskeletal: Negative for back pain. Skin: Negative for rash. Neurological: Negative for headaches, focal weakness or numbness. {**Psychiatric:  Endocrine:  Hematological/Lymphatic:  Allergic/Immunilogical: **}  ____________________________________________   PHYSICAL EXAM:  VITAL SIGNS: ED Triage Vitals  Encounter Vitals Group     BP 03/04/23 2346 (!) 178/73     Systolic BP Percentile --      Diastolic BP Percentile --      Pulse Rate 03/04/23 2346 66     Resp 03/04/23 2346 18     Temp 03/04/23 2346 97.6 F (36.4 C)     Temp Source 03/04/23 2346 Oral     SpO2 03/04/23 2346 99 %     Weight --      Height --      Head Circumference --      Peak Flow --      Pain Score 03/04/23 2348 0     Pain Loc --      Pain Education --      Exclude from Growth Chart --    {**  Revise as appropriate then delete this line - 8 systems required **} Constitutional: Alert and oriented. Well appearing and in no acute distress. Eyes: Conjunctivae are normal. PERRL. EOMI. Head: Atraumatic. {**Ears:  Healthy appearing ear canals and TMs bilaterally **}Nose: No congestion/rhinnorhea. Mouth/Throat: Mucous membranes are moist.  Oropharynx non-erythematous. Neck: No stridor.  No meningeal signs.  {**No cervical spine tenderness to palpation.**} Cardiovascular: Normal rate, regular rhythm. Good peripheral circulation. Grossly normal heart sounds.   Respiratory: Normal respiratory  effort.  No retractions. Lungs CTAB. Gastrointestinal: Soft and nontender. No distention.  {**Genitourinary:  **}Musculoskeletal: No lower extremity tenderness nor edema. No gross deformities of extremities. Neurologic:  Normal speech and language. No gross focal neurologic deficits are appreciated.  Skin:  Skin is warm, dry and intact. No rash noted. {**Psychiatric: Mood and affect are normal. Speech and behavior are normal.**}  ____________________________________________   LABS (all labs ordered are listed, but only abnormal results are displayed)  Labs Reviewed  COMPREHENSIVE METABOLIC PANEL - Abnormal; Notable for the following components:      Result Value   Glucose, Bld 120 (*)    All other components within normal limits  CBC - Abnormal; Notable for the following components:   Hemoglobin 9.5 (*)    HCT 31.5 (*)    MCV 69.7 (*)    MCH 21.0 (*)    RDW 17.1 (*)    All other components within normal limits  RESP PANEL BY RT-PCR (RSV, FLU A&B, COVID)  RVPGX2   ____________________________________________  EKG  *** ____________________________________________  RADIOLOGY  DG Chest Port 1 View  Result Date: 03/05/2023 CLINICAL DATA:  Shortness of breath with cough EXAM: PORTABLE CHEST 1 VIEW COMPARISON:  Chest x-ray 02/06/2023 FINDINGS: Left-sided pacemaker is present. The heart size and mediastinal contours are within normal limits. Both lungs are clear. The visualized skeletal structures are unremarkable. IMPRESSION: No active disease. Electronically Signed   By: Darliss Cheney M.D.   On: 03/05/2023 00:23    ____________________________________________   PROCEDURES  Procedure(s) performed:   Procedures   ____________________________________________   INITIAL IMPRESSION / ASSESSMENT AND PLAN / ED COURSE  Pertinent labs & imaging results that were available during my care of the patient were reviewed by me and considered in my medical decision making (see chart  for details).   This patient is Presenting for Evaluation of ***, which {Range:23949} require a range of treatment options, and {MDMcomplaint:23950} a complaint that involves a {MDMlevelrisk:23951} risk of morbidity and mortality.  The Differential Diagnoses include***.  Critical Interventions-    Medications - No data to display  Reassessment after intervention:     I *** Additional Historical Information from ***, as the patient is ***.  I decided to review pertinent External Data, and in summary ***.   Clinical Laboratory Tests Ordered, included   Radiologic Tests Ordered, included ***. I independently interpreted the images and agree with radiology interpretation.   Cardiac Monitor Tracing which shows ***   Social Determinants of Health Risk ***  Consult complete with  Medical Decision Making: Summary: ***  Reevaluation with update and discussion with   ***Considered admission***  Patient's presentation is most consistent with {EM COPA:27473}   Disposition:   ____________________________________________  FINAL CLINICAL IMPRESSION(S) / ED DIAGNOSES  Final diagnoses:  None     NEW OUTPATIENT MEDICATIONS STARTED DURING THIS VISIT:  New Prescriptions   No medications on file    Note:  This document was prepared using Dragon voice recognition software and may include  unintentional dictation errors.  Alona Bene, MD, [facility]

## 2023-03-11 ENCOUNTER — Ambulatory Visit (INDEPENDENT_AMBULATORY_CARE_PROVIDER_SITE_OTHER): Payer: Medicare HMO

## 2023-03-11 DIAGNOSIS — I459 Conduction disorder, unspecified: Secondary | ICD-10-CM

## 2023-03-11 LAB — CUP PACEART REMOTE DEVICE CHECK
Battery Remaining Longevity: 82 mo
Battery Remaining Percentage: 85 %
Battery Voltage: 3.01 V
Brady Statistic AP VP Percent: 64 %
Brady Statistic AP VS Percent: 1 %
Brady Statistic AS VP Percent: 34 %
Brady Statistic AS VS Percent: 1 %
Brady Statistic RA Percent Paced: 64 %
Brady Statistic RV Percent Paced: 98 %
Date Time Interrogation Session: 20241129040014
Implantable Lead Connection Status: 753985
Implantable Lead Connection Status: 753985
Implantable Lead Implant Date: 20230525
Implantable Lead Implant Date: 20230525
Implantable Lead Location: 753859
Implantable Lead Location: 753860
Implantable Pulse Generator Implant Date: 20230525
Lead Channel Impedance Value: 400 Ohm
Lead Channel Impedance Value: 430 Ohm
Lead Channel Pacing Threshold Amplitude: 0.75 V
Lead Channel Pacing Threshold Amplitude: 0.75 V
Lead Channel Pacing Threshold Pulse Width: 0.5 ms
Lead Channel Pacing Threshold Pulse Width: 0.5 ms
Lead Channel Sensing Intrinsic Amplitude: 10.4 mV
Lead Channel Sensing Intrinsic Amplitude: 5 mV
Lead Channel Setting Pacing Amplitude: 2 V
Lead Channel Setting Pacing Amplitude: 2.5 V
Lead Channel Setting Pacing Pulse Width: 0.5 ms
Lead Channel Setting Sensing Sensitivity: 2.5 mV
Pulse Gen Model: 2272
Pulse Gen Serial Number: 8084442

## 2023-03-22 ENCOUNTER — Ambulatory Visit (INDEPENDENT_AMBULATORY_CARE_PROVIDER_SITE_OTHER): Payer: Medicare HMO | Admitting: Medical

## 2023-03-22 VITALS — BP 124/80 | HR 65 | Temp 98.7°F | Resp 16 | Wt 229.0 lb

## 2023-03-22 DIAGNOSIS — Z79899 Other long term (current) drug therapy: Secondary | ICD-10-CM

## 2023-03-22 DIAGNOSIS — R051 Acute cough: Secondary | ICD-10-CM | POA: Diagnosis not present

## 2023-03-22 DIAGNOSIS — J449 Chronic obstructive pulmonary disease, unspecified: Secondary | ICD-10-CM

## 2023-03-22 MED ORDER — BREZTRI AEROSPHERE 160-9-4.8 MCG/ACT IN AERO: 2.0000 | INHALATION_SPRAY | Freq: Two times a day (BID) | RESPIRATORY_TRACT | 0 refills | Status: DC

## 2023-03-22 MED ORDER — IPRATROPIUM BROMIDE 0.02 % IN SOLN
0.5000 mg | Freq: Once | RESPIRATORY_TRACT | Status: AC
  Administered 2023-03-22: 0.5 mg via RESPIRATORY_TRACT

## 2023-03-22 MED ORDER — HYDROCODONE BIT-HOMATROP MBR 5-1.5 MG/5ML PO SOLN
5.0000 mL | Freq: Three times a day (TID) | ORAL | 0 refills | Status: AC | PRN
Start: 2023-03-22 — End: 2023-03-27

## 2023-03-22 MED ORDER — ALBUTEROL SULFATE (2.5 MG/3ML) 0.083% IN NEBU
2.5000 mg | INHALATION_SOLUTION | Freq: Once | RESPIRATORY_TRACT | Status: AC
  Administered 2023-03-22: 2.5 mg via RESPIRATORY_TRACT

## 2023-03-22 MED ORDER — ALBUTEROL SULFATE HFA 108 (90 BASE) MCG/ACT IN AERS: 2.0000 | INHALATION_SPRAY | Freq: Four times a day (QID) | RESPIRATORY_TRACT | 1 refills | Status: DC | PRN

## 2023-03-22 NOTE — Patient Instructions (Addendum)
Recommendations: Begin Benadryl allergy medication at night for the next 5-7 days to help with mucous Begin trial of Breztri inhaler, 2 puffs twice daily.  Rinse mouth out with water after use  You can still use albuterol rescue inhaler 2 puffs every 4-6 hours as needed Begin Hycodan cough syrup as needed, up to 3 times daily.   This can cause drowsiness, so use caution Make sure you are drinking 7-8 bottles of water daily Call back by Friday to let me know if improving

## 2023-03-22 NOTE — Addendum Note (Signed)
Addended by: Herminio Commons A on: 03/22/2023 01:35 PM   Modules accepted: Orders

## 2023-03-22 NOTE — Progress Notes (Signed)
Subjective:  Joel [name] is a 82 y.o. male who presents for Chief Complaint  Patient presents with   Cough    Cough x 2 weeks. Coughing up yellow mucous. Laying and going to bed is the worse with coughing   Here for follow-up on cough.  I saw him about 2 weeks ago for similar.  At that time we initiated some medication.  Handout using Tessalon Perle cough drops and antibiotic.  Within a couple days he went to the emergency department for worse cough.  They added some steroids to help with bronchial inflammation.  He had labs and chest x-ray.  He is here for ongoing cough.  He still feels like he has quite a bit congestion and cough.  He does see some improvement though.  Using albuterol about 3 times a day and it helps some.  No current fever, body aches or chills, no ear pain, no sore throat, no fever.  He is getting productive yellow clumps of mucus with cough.  No other aggravating or relieving factors.    No other c/o.  Past Medical History:  Diagnosis Date   Alpha thalassemia trait    Atypical chest pain    CAD (coronary artery disease)    a. Multiple prior evaluations then 05/2012 s/p BMS to OM2. b. Nuc 11/2012 wnl. c. Cath 2015: patent stent, moderate LAD/RCA dz, treated medically.   Cataract    CHEST PAIN UNSPECIFIED 09/13/2008   Qualifier: Diagnosis of  By: Trevor Iha, RN, Heather     CKD (chronic kidney disease), stage II    COPD (chronic obstructive pulmonary disease) (HCC)    Cough secondary to angiotensin converting enzyme inhibitor (ACE-I)    Diabetes mellitus (HCC) 09/07/2019   Diabetes mellitus without complication (HCC)    Diverticulosis    DVT (deep venous thrombosis) (HCC)    Family history of breast cancer in first degree relative    GERD (gastroesophageal reflux disease)    Headache(784.0)    Hx: of   History of blood transfusion    "I've had 2; don't know what it was related to" (11/09/2012)   HLD (hyperlipidemia)    HTN (hypertension)    Iron deficiency  anemia    Lymphoproliferative disorder, low grade B cell (HCC) 05/21/2020   Microcytic anemia    PAF (paroxysmal atrial fibrillation) (HCC)    Peripheral vascular disease (HCC)    a. L pop-tibial bypass 2011, followed by [facility].   Personal history of prostate cancer    Presence of permanent cardiac pacemaker    Rotator cuff injury    "left arm; never repaired" (11/09/2012)   Current Outpatient Medications on File Prior to Visit  Medication Sig Dispense Refill   abiraterone acetate (ZYTIGA) 250 MG tablet Take 1,000 mg by mouth daily. On an empty stomach     amLODipine (NORVASC) 10 MG tablet TAKE 1 TABLET EVERY DAY 90 tablet 3   aspirin EC 81 MG tablet Take 1 tablet (81 mg total) by mouth daily. Swallow whole. 90 tablet 3   brimonidine (ALPHAGAN) 0.2 % ophthalmic solution Place 1 drop into both eyes 2 (two) times daily.     ferrous gluconate (FERGON) 324 MG tablet Take 1 tablet (324 mg total) by mouth daily with breakfast. 30 tablet 2   gabapentin (NEURONTIN) 600 MG tablet Take 600 mg by mouth 2 (two) times daily.     isosorbide mononitrate (IMDUR) 120 MG 24 hr tablet Take 1 tablet (120 mg total) by mouth daily. 90  tablet 3   latanoprost (XALATAN) 0.005 % ophthalmic solution Place 1 drop into both eyes at bedtime.     lidocaine (LIDODERM) 5 % Place 1 patch onto the skin daily.     omeprazole (PRILOSEC) 20 MG capsule Take 20 mg by mouth daily.     polyethylene glycol powder (GLYCOLAX/MIRALAX) 17 GM/SCOOP powder Take 17 g by mouth daily. 289 g 0   pravastatin (PRAVACHOL) 40 MG tablet TAKE 1 TABLET EVERY EVENING 90 tablet 3   predniSONE (DELTASONE) 5 MG tablet Take 5 mg by mouth in the morning.     silver sulfADIAZINE (SILVADENE) 1 % cream Apply 1 Application topically daily. 50 g 0   tamsulosin (FLOMAX) 0.4 MG CAPS capsule Take 0.4 mg by mouth daily.     traMADol (ULTRAM) 50 MG tablet TAKE 1 TABLET(50 MG) BY MOUTH EVERY 12 HOURS AS NEEDED 30 tablet 0   Accu-Chek FastClix Lancets MISC Test once  daily 102 each 12   diclofenac Sodium (VOLTAREN) 1 % GEL Apply 2 g topically 2 (two) times daily as needed (back pain).     nitroGLYCERIN (NITROSTAT) 0.4 MG SL tablet Place 1 tablet (0.4 mg total) under the tongue every 5 (five) minutes as needed for chest pain (max 3 doses). 75 tablet 2   No current facility-administered medications on file prior to visit.     The following portions of the patient's history were reviewed and updated as appropriate: allergies, current medications, past family history, past medical history, past social history, past surgical history and problem list.  ROS Otherwise as in subjective above     Objective: BP 124/80   Pulse 65   Temp 98.7 F (37.1 C)   Resp 16   Wt 229 lb (103.9 kg)   SpO2 96%   BMI 33.82 kg/m   General appearance: alert, no distress, well developed, well nourished HEENT: normocephalic, sclerae anicteric, conjunctiva pink and moist, TMs wnl, nares without discharge, +erythema, pharynx WNL Oral cavity: somewhat dryMM, no lesions Neck: supple, no lymphadenopathy, no thyromegaly, no masses Heart: RRR, normal S1, S2, no murmurs Lungs: Somewhat coarse sounds, no wheezes, rhonchi, or rales No lower extremity edema    Assessment: Encounter Diagnoses  Name Primary?   Acute cough Yes   Medication management    Chronic obstructive pulmonary disease, unspecified COPD type (HCC)      Plan: I reviewed his recent x-ray from 03/04/2023 that was negative.  I reviewed his blood work including CBC, comprehensive metabolic panel, and respiratory panel from 03/04/2023 emergency department visit.  Labs show chronic anemia, elevated blood sugar but no obvious pathogen.  Given his symptoms and current exam findings I gave him a round of DuoNeb nebulizer treatment today.  He did feel some improvement after nebulizer treatment  We discussed the following home medications and recommendations.  Recommendations: Begin Benadryl allergy medication  at night for the next 5-7 days to help with mucous Begin trial of Breztri inhaler, 2 puffs twice daily.  Rinse mouth out with water after use  You can still use albuterol rescue inhaler 2 puffs every 4-6 hours as needed Begin Hycodan cough syrup as needed, up to 3 times daily.   This can cause drowsiness, so use caution Make sure you are drinking 7-8 bottles of water daily Call back by Friday to let me know if improving  Jeryn was seen today for cough.  Diagnoses and all orders for this visit:  Acute cough -     Cancel: Spirometry with  Graph  Medication management -     albuterol (VENTOLIN HFA) 108 (90 Base) MCG/ACT inhaler; Inhale 2 puffs into the lungs every 6 (six) hours as needed for wheezing or shortness of breath.  Chronic obstructive pulmonary disease, unspecified COPD type (HCC) -     albuterol (VENTOLIN HFA) 108 (90 Base) MCG/ACT inhaler; Inhale 2 puffs into the lungs every 6 (six) hours as needed for wheezing or shortness of breath.  Other orders -     HYDROcodone bit-homatropine (HYCODAN) 5-1.5 MG/5ML syrup; Take 5 mLs by mouth every 8 (eight) hours as needed for up to 5 days for cough. -     Budeson-Glycopyrrol-Formoterol (BREZTRI AEROSPHERE) 160-9-4.8 MCG/ACT AERO; Inhale 2 puffs into the lungs 2 (two) times daily.    Follow up: prn

## 2023-03-31 ENCOUNTER — Ambulatory Visit: Payer: Medicare HMO | Admitting: Medical

## 2023-03-31 ENCOUNTER — Encounter: Payer: Self-pay | Admitting: Medical

## 2023-03-31 VITALS — BP 124/78 | HR 60 | Wt 226.8 lb

## 2023-03-31 DIAGNOSIS — R9389 Abnormal findings on diagnostic imaging of other specified body structures: Secondary | ICD-10-CM | POA: Diagnosis not present

## 2023-03-31 DIAGNOSIS — R053 Chronic cough: Secondary | ICD-10-CM | POA: Diagnosis not present

## 2023-03-31 NOTE — Progress Notes (Signed)
Subjective:  Joel [name] is a 82 y.o. male who presents for Chief Complaint  Patient presents with   other    Still coughing been going on a couple of weeks now, night time and morning also when his is eating and talking, no fever, NO ST,  has sinus drainage clear mucous,    Here for chronic cough for several weeks.  I just saw him a little over a week ago.  At that time we did a nebulizer here in the office for DuoNeb and sent him home with a trial of Breztri and cough medication  He has not really responded well to any of these medications.  He is no worse or no better.  No current sore throat, ear pain, fever, body aches or chills, no nausea vomiting or diarrhea, does not feel particular short of breath or wheezing  Just has an ongoing aggravating cough  Allergy medicine Claritin does not seem to help either  He does have some clear nasal drainage.  Seem to be worse at night and first thing in the morning  Denies reflux or acid indigestion or heartburn.  He is a former smoker but quit in 2010, smoked for about 20 years total  No animals at home.  No leg swelling, no chest pain, no palpitations, no weight change  No other aggravating or relieving factors.    No other c/o.  Past Medical History:  Diagnosis Date   Alpha thalassemia trait    Atypical chest pain    CAD (coronary artery disease)    a. Multiple prior evaluations then 05/2012 s/p BMS to OM2. b. Nuc 11/2012 wnl. c. Cath 2015: patent stent, moderate LAD/RCA dz, treated medically.   Cataract    CHEST PAIN UNSPECIFIED 09/13/2008   Qualifier: Diagnosis of  By: Trevor Iha, RN, Heather     CKD (chronic kidney disease), stage II    COPD (chronic obstructive pulmonary disease) (HCC)    Cough secondary to angiotensin converting enzyme inhibitor (ACE-I)    Diabetes mellitus (HCC) 09/07/2019   Diabetes mellitus without complication (HCC)    Diverticulosis    DVT (deep venous thrombosis) (HCC)    Family history of  breast cancer in first degree relative    GERD (gastroesophageal reflux disease)    Headache(784.0)    Hx: of   History of blood transfusion    "I've had 2; don't know what it was related to" (11/09/2012)   HLD (hyperlipidemia)    HTN (hypertension)    Iron deficiency anemia    Lymphoproliferative disorder, low grade B cell (HCC) 05/21/2020   Microcytic anemia    PAF (paroxysmal atrial fibrillation) (HCC)    Peripheral vascular disease (HCC)    a. L pop-tibial bypass 2011, followed by [facility].   Personal history of prostate cancer    Presence of permanent cardiac pacemaker    Rotator cuff injury    "left arm; never repaired" (11/09/2012)   Current Outpatient Medications on File Prior to Visit  Medication Sig Dispense Refill   abiraterone acetate (ZYTIGA) 250 MG tablet Take 1,000 mg by mouth daily. On an empty stomach     Accu-Chek FastClix Lancets MISC Test once daily 102 each 12   albuterol (VENTOLIN HFA) 108 (90 Base) MCG/ACT inhaler Inhale 2 puffs into the lungs every 6 (six) hours as needed for wheezing or shortness of breath. 8 g 1   aspirin EC 81 MG tablet Take 1 tablet (81 mg total) by mouth daily. Swallow whole. 90  tablet 3   brimonidine (ALPHAGAN) 0.2 % ophthalmic solution Place 1 drop into both eyes 2 (two) times daily.     Budeson-Glycopyrrol-Formoterol (BREZTRI AEROSPHERE) 160-9-4.8 MCG/ACT AERO Inhale 2 puffs into the lungs 2 (two) times daily. 10.7 g 0   diclofenac Sodium (VOLTAREN) 1 % GEL Apply 2 g topically 2 (two) times daily as needed (back pain).     ferrous gluconate (FERGON) 324 MG tablet Take 1 tablet (324 mg total) by mouth daily with breakfast. 30 tablet 2   gabapentin (NEURONTIN) 600 MG tablet Take 600 mg by mouth 2 (two) times daily.     isosorbide mononitrate (IMDUR) 120 MG 24 hr tablet Take 1 tablet (120 mg total) by mouth daily. 90 tablet 3   latanoprost (XALATAN) 0.005 % ophthalmic solution Place 1 drop into both eyes at bedtime.     lidocaine (LIDODERM) 5 %  Place 1 patch onto the skin daily.     nitroGLYCERIN (NITROSTAT) 0.4 MG SL tablet Place 1 tablet (0.4 mg total) under the tongue every 5 (five) minutes as needed for chest pain (max 3 doses). 75 tablet 2   omeprazole (PRILOSEC) 20 MG capsule Take 20 mg by mouth daily.     polyethylene glycol powder (GLYCOLAX/MIRALAX) 17 GM/SCOOP powder Take 17 g by mouth daily. 289 g 0   pravastatin (PRAVACHOL) 40 MG tablet TAKE 1 TABLET EVERY EVENING 90 tablet 3   predniSONE (DELTASONE) 5 MG tablet Take 5 mg by mouth in the morning.     silver sulfADIAZINE (SILVADENE) 1 % cream Apply 1 Application topically daily. 50 g 0   traMADol (ULTRAM) 50 MG tablet TAKE 1 TABLET(50 MG) BY MOUTH EVERY 12 HOURS AS NEEDED 30 tablet 0   amLODipine (NORVASC) 10 MG tablet TAKE 1 TABLET EVERY DAY 90 tablet 3   tamsulosin (FLOMAX) 0.4 MG CAPS capsule Take 0.4 mg by mouth daily. (Patient not taking: Reported on 03/31/2023)     No current facility-administered medications on file prior to visit.     The following portions of the patient's history were reviewed and updated as appropriate: allergies, current medications, past family history, past medical history, past social history, past surgical history and problem list.  ROS Otherwise as in subjective above     Objective: BP 124/78   Pulse 60   Wt 226 lb 12.8 oz (102.9 kg)   SpO2 98%   BMI 33.49 kg/m   Wt Readings from Last 3 Encounters:  03/31/23 226 lb 12.8 oz (102.9 kg)  03/22/23 229 lb (103.9 kg)  02/28/23 230 lb 6.4 oz (104.5 kg)   BP Readings from Last 3 Encounters:  03/31/23 124/78  03/22/23 124/80  03/05/23 (!) 157/74    General appearance: alert, no distress, well developed, well nourished HEENT: normocephalic, sclerae anicteric, conjunctiva pink and moist, TMs wnl, nares without discharge, no erythema, pharynx WNL Oral cavity: somewhat dryMM, no lesions Neck: supple, no lymphadenopathy, no thyromegaly, no masses Heart: RRR, normal S1, S2, no  murmurs Lungs: Somewhat coarse sounds, no wheezes, rhonchi, or rales No lower extremity edema    Assessment: Encounter Diagnoses  Name Primary?   Chronic cough Yes   Abnormal CT of the chest        Plan: I reviewed his recent x-ray from 03/04/2023 that was negative.  I reviewed his blood work including CBC, comprehensive metabolic panel, and respiratory panel from 03/04/2023 emergency department visit.  Labs show chronic anemia, elevated blood sugar but no obvious pathogen.  He has not  really had any major improvements recently with Breztri inhaler, nebulizer therapy here in the office, Tessalon Perles, Hycodan syrup, medication.  Former smoker, quit in 2010  We were unable to get a PFT today given difficulty with our device  Prior CT chest from February 2024 report had impressions including dilated pulmonary artery suggestive of pulmonary hypertension and lung scarring and bronchiectasis  Clinically does not appear ill.  He only coughed once while in the office today.  Vital signs stable.  He is already on PPI over-the-counter.  He does not have any major GERD symptoms today he uses allergy medicine does have some allergy related symptoms but this may or may not explain all the cough  Referral placed for pulmonology consult  Randi was seen today for other.  Diagnoses and all orders for this visit:  Chronic cough -     Ambulatory referral to Pulmonology  Abnormal CT of the chest -     Ambulatory referral to Pulmonology  Other orders -     Cancel: Spirometry with Graph     Follow up: prn

## 2023-04-01 NOTE — Progress Notes (Signed)
Remote pacemaker transmission.   

## 2023-04-01 NOTE — Addendum Note (Signed)
Addended by: Herminio Commons A on: 04/01/2023 09:52 AM   Modules accepted: Orders

## 2023-04-01 NOTE — Addendum Note (Signed)
Addended by: Herminio Commons A on: 04/01/2023 10:13 AM   Modules accepted: Orders

## 2023-04-04 NOTE — Addendum Note (Signed)
Addended by: Herminio Commons A on: 04/04/2023 10:13 AM   Modules accepted: Orders

## 2023-04-04 NOTE — Progress Notes (Signed)
Pt will be coming by tomorrow to pick up the nebulizer machine. We need to send in the nebulizer solution to the pharmacy.  Pt dx is COPD and Respirtatory condition unspecific due to his coughing.

## 2023-04-05 ENCOUNTER — Other Ambulatory Visit: Payer: Self-pay | Admitting: Internal Medicine

## 2023-04-05 MED ORDER — IPRATROPIUM-ALBUTEROL 0.5-2.5 (3) MG/3ML IN SOLN: 3.0000 mL | Freq: Four times a day (QID) | RESPIRATORY_TRACT | 0 refills | Status: DC | PRN

## 2023-04-07 NOTE — Addendum Note (Signed)
Addended by: Herminio Commons A on: 04/07/2023 04:07 PM   Modules accepted: Orders

## 2023-04-07 NOTE — Addendum Note (Signed)
Addended by: Herminio Commons A on: 04/07/2023 03:31 PM   Modules accepted: Orders

## 2023-04-07 NOTE — Addendum Note (Signed)
Addended by: Herminio Commons A on: 04/07/2023 12:41 PM   Modules accepted: Orders

## 2023-04-11 ENCOUNTER — Telehealth: Payer: Self-pay | Admitting: Internal Medicine

## 2023-04-11 NOTE — Telephone Encounter (Signed)
Pt went to [facility] hospital for blood in stool

## 2023-04-11 NOTE — Addendum Note (Signed)
Addended by: Herminio Commons A on: 04/11/2023 03:49 PM   Modules accepted: Orders

## 2023-04-21 DIAGNOSIS — H00025 Hordeolum internum left lower eyelid: Secondary | ICD-10-CM | POA: Diagnosis not present

## 2023-04-29 ENCOUNTER — Encounter: Payer: Self-pay | Admitting: Pulmonary Disease

## 2023-05-10 ENCOUNTER — Emergency Department (HOSPITAL_COMMUNITY)
Admission: EM | Admit: 2023-05-10 | Discharge: 2023-05-10 | Disposition: A | Discharge status: Home / Self Care | Payer: Medicare HMO

## 2023-05-10 ENCOUNTER — Other Ambulatory Visit: Payer: Self-pay

## 2023-05-10 ENCOUNTER — Emergency Department (HOSPITAL_COMMUNITY): Payer: Medicare HMO

## 2023-05-10 ENCOUNTER — Encounter (HOSPITAL_COMMUNITY): Payer: Self-pay

## 2023-05-10 DIAGNOSIS — Z7982 Long term (current) use of aspirin: Secondary | ICD-10-CM | POA: Insufficient documentation

## 2023-05-10 DIAGNOSIS — D649 Anemia, unspecified: Secondary | ICD-10-CM | POA: Insufficient documentation

## 2023-05-10 DIAGNOSIS — Z79899 Other long term (current) drug therapy: Secondary | ICD-10-CM | POA: Diagnosis not present

## 2023-05-10 DIAGNOSIS — R531 Weakness: Secondary | ICD-10-CM | POA: Diagnosis not present

## 2023-05-10 DIAGNOSIS — I6782 Cerebral ischemia: Secondary | ICD-10-CM | POA: Diagnosis not present

## 2023-05-10 LAB — CBC
HCT: 30.6 % — ABNORMAL LOW (ref 39.0–52.0)
Hemoglobin: 9.2 g/dL — ABNORMAL LOW (ref 13.0–17.0)
MCH: 20.4 pg — ABNORMAL LOW (ref 26.0–34.0)
MCHC: 30.1 g/dL (ref 30.0–36.0)
MCV: 67.8 fL — ABNORMAL LOW (ref 80.0–100.0)
Platelets: 219 10*3/uL (ref 150–400)
RBC: 4.51 MIL/uL (ref 4.22–5.81)
RDW: 18.2 % — ABNORMAL HIGH (ref 11.5–15.5)
WBC: 4.9 10*3/uL (ref 4.0–10.5)
nRBC: 0 % (ref 0.0–0.2)

## 2023-05-10 LAB — BASIC METABOLIC PANEL
Anion gap: 8 (ref 5–15)
BUN: 12 mg/dL (ref 8–23)
CO2: 26 mmol/L (ref 22–32)
Calcium: 9 mg/dL (ref 8.9–10.3)
Chloride: 100 mmol/L (ref 98–111)
Creatinine, Ser: 1.14 mg/dL (ref 0.61–1.24)
GFR, Estimated: >60 mL/min (ref >60)
Glucose, Bld: 137 mg/dL — ABNORMAL HIGH (ref 70–99)
Potassium: 4 mmol/L (ref 3.5–5.1)
Sodium: 134 mmol/L — ABNORMAL LOW (ref 135–145)

## 2023-05-10 LAB — TROPONIN I (HIGH SENSITIVITY): Troponin I (High Sensitivity): 9 ng/L (ref <18)

## 2023-05-10 LAB — CBG MONITORING, ED: Glucose-Capillary: 156 mg/dL — ABNORMAL HIGH (ref 70–99)

## 2023-05-10 NOTE — ED Provider Notes (Signed)
EMERGENCY DEPARTMENT AT [facility] Provider Note   CSN: [id number] Arrival date & time: 05/10/23  7829     History  Chief Complaint  Patient presents with   Weakness    Joel [name] is a 83 y.o. male.  83 year old male present emergency department for generalized weakness.  Seemingly started 2 days ago at church.  Reports an overwhelming feeling of being drained and then he could not hardly move.  He drank some water, EMS came out given some IV fluids and he felt better.  Reported feeling better yesterday and then symptoms seemingly happen again this morning.  Reports feeling improved currently.  Not having headache, vision changes, chest pain, shortness of breath, abdominal pain.    Weakness      Home Medications Prior to Admission medications   Medication Sig Start Date End Date Taking? Authorizing Provider  abiraterone acetate (ZYTIGA) 250 MG tablet Take 1,000 mg by mouth daily. On an empty stomach 02/17/18   [provider]  Accu-Chek FastClix Lancets MISC Test once daily 11/23/18   Henson, Vickie L, NP-C  albuterol (VENTOLIN HFA) 108 (90 Base) MCG/ACT inhaler Inhale 2 puffs into the lungs every 6 (six) hours as needed for wheezing or shortness of breath. 03/22/23   Tysinger, Kermit Balo, PA-C  amLODipine (NORVASC) 10 MG tablet TAKE 1 TABLET EVERY DAY 11/10/22   Kathleene Hazel, MD  aspirin EC 81 MG tablet Take 1 tablet (81 mg total) by mouth daily. Swallow whole. 03/17/22   Kathleene Hazel, MD  brimonidine (ALPHAGAN) 0.2 % ophthalmic solution Place 1 drop into both eyes 2 (two) times daily. 05/09/20   [provider]  Budeson-Glycopyrrol-Formoterol (BREZTRI AEROSPHERE) 160-9-4.8 MCG/ACT AERO Inhale 2 puffs into the lungs 2 (two) times daily. 03/22/23   Tysinger, Kermit Balo, PA-C  diclofenac Sodium (VOLTAREN) 1 % GEL Apply 2 g topically 2 (two) times daily as needed (back pain). 06/16/20   [provider]  ferrous  gluconate (FERGON) 324 MG tablet Take 1 tablet (324 mg total) by mouth daily with breakfast. 08/30/22   Tysinger, Kermit Balo, PA-C  gabapentin (NEURONTIN) 600 MG tablet Take 600 mg by mouth 2 (two) times daily.    [provider]  ipratropium-albuterol (DUONEB) 0.5-2.5 (3) MG/3ML SOLN Take 3 mLs by nebulization every 6 (six) hours as needed. 04/05/23   Tysinger, Kermit Balo, PA-C  isosorbide mononitrate (IMDUR) 120 MG 24 hr tablet Take 1 tablet (120 mg total) by mouth daily. 02/21/23   Kathleene Hazel, MD  latanoprost (XALATAN) 0.005 % ophthalmic solution Place 1 drop into both eyes at bedtime. 02/28/20   [provider]  lidocaine (LIDODERM) 5 % Place 1 patch onto the skin daily. 10/26/22   [provider]  nitroGLYCERIN (NITROSTAT) 0.4 MG SL tablet Place 1 tablet (0.4 mg total) under the tongue every 5 (five) minutes as needed for chest pain (max 3 doses). 02/19/22   Kathleene Hazel, MD  omeprazole (PRILOSEC) 20 MG capsule Take 20 mg by mouth daily.    [provider]  polyethylene glycol powder (GLYCOLAX/MIRALAX) 17 GM/SCOOP powder Take 17 g by mouth daily. 01/25/22   Tysinger, Kermit Balo, PA-C  pravastatin (PRAVACHOL) 40 MG tablet TAKE 1 TABLET EVERY EVENING 11/10/22   Kathleene Hazel, MD  predniSONE (DELTASONE) 5 MG tablet Take 5 mg by mouth in the morning. 07/17/20   [provider]  silver sulfADIAZINE (SILVADENE) 1 % cream Apply 1 Application topically daily. 01/10/23  Tysinger, Kermit Balo, PA-C  tamsulosin (FLOMAX) 0.4 MG CAPS capsule Take 0.4 mg by mouth daily. Patient not taking: Reported on 03/31/2023    [provider]  traMADol (ULTRAM) 50 MG tablet TAKE 1 TABLET(50 MG) BY MOUTH EVERY 12 HOURS AS NEEDED 06/25/22   Tysinger, Kermit Balo, PA-C      Allergies    Topiramate, Atenolol, Lisinopril, and Tramadol-acetaminophen    Review of Systems   Review of Systems  Neurological:  Positive for weakness.    Physical Exam Updated  Vital Signs BP 130/63 (BP Location: Left Arm)   Pulse 60   Temp 97.7 F (36.5 C) (Oral)   Resp 18   SpO2 98%  Physical Exam Vitals and nursing note reviewed.  Constitutional:      General: He is not in acute distress. HENT:     Head: Normocephalic.     Mouth/Throat:     Mouth: Mucous membranes are moist.  Eyes:     Conjunctiva/sclera: Conjunctivae normal.  Cardiovascular:     Rate and Rhythm: Normal rate and regular rhythm.  Pulmonary:     Effort: Pulmonary effort is normal.     Breath sounds: Normal breath sounds.  Abdominal:     General: Abdomen is flat. There is no distension.     Palpations: Abdomen is soft.     Tenderness: There is no abdominal tenderness. There is no guarding or rebound.  Musculoskeletal:        General: Normal range of motion.  Skin:    General: Skin is warm.     Capillary Refill: Capillary refill takes less than 2 seconds.  Neurological:     General: No focal deficit present.     Mental Status: He is alert and oriented to person, place, and time.  Psychiatric:        Mood and Affect: Mood normal.        Behavior: Behavior normal.     ED Results / Procedures / Treatments   Labs (all labs ordered are listed, but only abnormal results are displayed) Labs Reviewed  BASIC METABOLIC PANEL - Abnormal; Notable for the following components:      Result Value   Sodium 134 (*)    Glucose, Bld 137 (*)    All other components within normal limits  CBC - Abnormal; Notable for the following components:   Hemoglobin 9.2 (*)    HCT 30.6 (*)    MCV 67.8 (*)    MCH 20.4 (*)    RDW 18.2 (*)    All other components within normal limits  CBG MONITORING, ED - Abnormal; Notable for the following components:   Glucose-Capillary 156 (*)    All other components within normal limits  URINALYSIS, ROUTINE W REFLEX MICROSCOPIC  TROPONIN I (HIGH SENSITIVITY)  TROPONIN I (HIGH SENSITIVITY)    EKG EKG Interpretation Date/Time:  Tuesday May 10 2023 10:00:02  EST Ventricular Rate:  65 PR Interval:    QRS Duration:  136 QT Interval:  442 QTC Calculation: 459 R Axis:   23  Text Interpretation: AV dual-paced rhythm with occasional ventricular-paced complexes Abnormal ECG When compared with ECG of 04-Mar-2023 23:50, PREVIOUS ECG IS PRESENT Confirmed by Estanislado Pandy [phone]) on 05/10/2023 7:02:15 PM  Radiology DG Chest 1 View Result Date: 05/10/2023 CLINICAL DATA:  Weakness. EXAM: CHEST  1 VIEW COMPARISON:  03/05/2023 FINDINGS: Left-sided pacemaker in place.The cardiomediastinal contours are normal. The lungs are clear. Pulmonary vasculature is normal. No consolidation, pleural effusion, or pneumothorax. No  acute osseous abnormalities are seen. IMPRESSION: No active disease. Electronically Signed   By: Narda Rutherford M.D.   On: 05/10/2023 19:06   CT Head Wo Contrast Result Date: 05/10/2023 CLINICAL DATA:  Syncope/presyncope, cerebrovascular cause suspected. Generalized weakness. EXAM: CT HEAD WITHOUT CONTRAST TECHNIQUE: Contiguous axial images were obtained from the base of the skull through the vertex without intravenous contrast. RADIATION DOSE REDUCTION: This exam was performed according to the departmental dose-optimization program which includes automated exposure control, adjustment of the mA and/or kV according to patient size and/or use of iterative reconstruction technique. COMPARISON:  Head CT 01/31/2023 FINDINGS: Brain: There is no evidence of an acute infarct, intracranial hemorrhage, mass, midline shift, or extra-axial fluid collection. Chronic right-sided infarcts involving the temporal lobe, insula, frontal operculum, and high posterior frontal lobe are unchanged, as is a small chronic infarct in the high posterior left. Hypodensities elsewhere in the cerebral white matter nonspecific but compatible with mild chronic small vessel ischemic disease. Mild global cerebral atrophy is within normal limits for age. Vascular: Calcified atherosclerosis  at the skull base. No hyperdense vessel. Skull: No acute fracture or suspicious osseous lesion. Sinuses/Orbits: Moderate right frontal and left ethmoid sinus mucosal thickening. Bilateral cataract extraction. Clear mastoid air cells. Other: None. IMPRESSION: 1. No evidence of acute intracranial abnormality. 2. Chronic ischemia with old infarcts as above. Electronically Signed   By: Sebastian Ache M.D.   On: 05/10/2023 11:24    Procedures Procedures    Medications Ordered in ED Medications - No data to display  ED Course/ Medical Decision Making/ A&P Clinical Course as of 05/10/23 2138  Tue May 10, 2023  1832 Last echo: "IMPRESSIONS     1. Left ventricular ejection fraction, by estimation, is 60 to 65%. The  left ventricle has normal function. The left ventricle has no regional  wall motion abnormalities.   2. Right ventricular systolic function is normal. The right ventricular  size is normal.   3. No left atrial/left atrial appendage thrombus was detected.   4. The mitral valve is normal in structure. Trivial mitral valve  regurgitation. No evidence of mitral stenosis.   5. The aortic valve is normal in structure. Aortic valve regurgitation is  trivial. No aortic stenosis is present.   6. Aortic dilatation noted. There is mild dilatation of the aortic root,  measuring 41 mm.   7. The inferior vena cava is normal in size with greater than 50%  respiratory variability, suggesting right atrial pressure of 3 mmHg.   8. Evidence of atrial level shunting detected by color flow Doppler.  There is a small patent foramen ovale with bidirectional shunting across  atrial septum.  " [TY]  2040 Pacemaker interrogated; no arrhythmias that would explain patients symptoms. [TY]  2040 CBC(!) Stable chronic anemia.  No leukocytosis to suggest stomach infection. [TY]  2040 Basic metabolic panel(!) No significant metabolic derangements.  Normal kidney function. [TY]    Clinical Course User  Index [TY] Coral Spikes, DO                                 Medical Decision Making This is a well-appearing 83 year old male presenting emergency department for generalized weakness.  He is afebrile nontachycardic hemodynamically stable.  He is well-appearing on exam, no focal deficits.  Symptoms seemingly resolved.  Orthostatics are negative.  Does have pacemaker, interrogated no arrhythmias.  EKG here reassuring no ST segment changes to  indicate ischemia.  Troponin negative.  ACS unlikely.  Low patient for PE.  Chest x-ray without pneumonia pneumothorax.  CT head without acute abnormalities. he is not having any urinary symptoms.  He is asymptomatic currently.  Discussed utility of admission versus outpatient follow-up.  Would like to follow-up with his primary doctors.  Stable for discharge at this time.  Amount and/or Complexity of Data Reviewed Labs:  Decision-making details documented in ED Course. Radiology: ordered.  Risk Decision regarding hospitalization.         Final Clinical Impression(s) / ED Diagnoses Final diagnoses:  None    Rx / DC Orders ED Discharge Orders     None         Coral Spikes, DO 05/10/23 2138

## 2023-05-10 NOTE — Discharge Instructions (Signed)
Please follow-up with your primary doctor.  Return immediately felt fevers, chills, chest pain, shortness of breath, passout, return symptoms, develop unilateral weakness, facial droop, difficulty finding words or you develop any new or worsening symptoms that are concerning to you.

## 2023-05-10 NOTE — ED Provider Triage Note (Signed)
Emergency Medicine Provider Triage Evaluation Note  MARTON MALIZIA , a 83 y.o. male  was evaluated in triage.  Pt complains of generalized weakness.  Had episode of sudden onset weakness at church 2 days ago in which he felt like he could hardly move and had to sit down and rest.  He drank some water and symptoms resolved.  He made an appointment with his PCP but canceled it because he was not feeling well.  Not complaining of weakness again all over, and dry mouth.  Review of Systems  Positive: Weakness, dry mouth Negative: Chest pain, fever, chills, numbness  Physical Exam  BP 120/61 (BP Location: Left Arm)   Pulse 62   Temp 97.7 F (36.5 C) (Oral)   Resp 18   SpO2 98%  Gen:   Awake, no distress   Resp:  Normal effort  MSK:   Moves extremities without difficulty  Other:  5/5 strength in all extremities, normal sensation, no facial droop, no slurred speech, cranial nerves grossly intact  Lung sounds clear, heart regular rate and rhythm  Medical Decision Making  Medically screening exam initiated at 10:16 AM.  Appropriate orders placed.  VERNOR MONNIG was informed that the remainder of the evaluation will be completed by another provider, this initial triage assessment does not replace that evaluation, and the importance of remaining in the ED until their evaluation is complete.  Initial labs and imaging ordered including head CT, troponin, and EKG   Arrington Yohe T, PA-C 05/10/23 1017

## 2023-05-10 NOTE — ED Triage Notes (Signed)
Pt arrives via POV. Pt reports sudden onset of weakness while at church this past Sunday, reports he almost passed out. PT states the symptoms have improved up until this morning. Pt states the weakness is all over. Denies any associated symptoms. Pt is AxOx4.

## 2023-05-11 ENCOUNTER — Telehealth: Payer: Self-pay

## 2023-05-11 NOTE — Transitions of Care (Post Inpatient/ED Visit) (Signed)
   05/11/2023  Name: Joel [name] MRN: [id number] DOB: 1940-11-30  Today's TOC FU Call Status: Today's TOC FU Call Status:: Unsuccessful Call (1st Attempt) Unsuccessful Call (1st Attempt) Date: 05/11/23  Attempted to reach the patient regarding the most recent Inpatient/ED visit.  Follow Up Plan: Additional outreach attempts will be made to reach the patient to complete the Transitions of Care (Post Inpatient/ED visit) call.   Signature Karena Addison, LPN [facility] Nurse Health Advisor Direct Dial [phone]

## 2023-05-12 ENCOUNTER — Encounter: Payer: Self-pay | Admitting: Medical

## 2023-05-12 ENCOUNTER — Ambulatory Visit: Payer: Medicare HMO | Admitting: Medical

## 2023-05-12 VITALS — BP 112/62 | HR 64 | Wt 226.2 lb

## 2023-05-12 DIAGNOSIS — R5383 Other fatigue: Secondary | ICD-10-CM

## 2023-05-12 DIAGNOSIS — E1165 Type 2 diabetes mellitus with hyperglycemia: Secondary | ICD-10-CM

## 2023-05-12 DIAGNOSIS — Z79899 Other long term (current) drug therapy: Secondary | ICD-10-CM

## 2023-05-12 DIAGNOSIS — E569 Vitamin deficiency, unspecified: Secondary | ICD-10-CM

## 2023-05-12 DIAGNOSIS — J439 Emphysema, unspecified: Secondary | ICD-10-CM

## 2023-05-12 DIAGNOSIS — Z9989 Dependence on other enabling machines and devices: Secondary | ICD-10-CM

## 2023-05-12 DIAGNOSIS — J449 Chronic obstructive pulmonary disease, unspecified: Secondary | ICD-10-CM | POA: Diagnosis not present

## 2023-05-12 DIAGNOSIS — D509 Iron deficiency anemia, unspecified: Secondary | ICD-10-CM

## 2023-05-12 DIAGNOSIS — E559 Vitamin D deficiency, unspecified: Secondary | ICD-10-CM | POA: Diagnosis not present

## 2023-05-12 DIAGNOSIS — D649 Anemia, unspecified: Secondary | ICD-10-CM

## 2023-05-12 MED ORDER — IPRATROPIUM-ALBUTEROL 0.5-2.5 (3) MG/3ML IN SOLN: 3.0000 mL | Freq: Four times a day (QID) | RESPIRATORY_TRACT | 1 refills | Status: DC | PRN

## 2023-05-12 MED ORDER — ALBUTEROL SULFATE HFA 108 (90 BASE) MCG/ACT IN AERS: 2.0000 | INHALATION_SPRAY | Freq: Four times a day (QID) | RESPIRATORY_TRACT | 1 refills | Status: DC | PRN

## 2023-05-12 MED ORDER — BREZTRI AEROSPHERE 160-9-4.8 MCG/ACT IN AERO: 2.0000 | INHALATION_SPRAY | Freq: Two times a day (BID) | RESPIRATORY_TRACT | 11 refills | Status: DC

## 2023-05-12 NOTE — Progress Notes (Signed)
Subjective:  Joel [name] is a 83 y.o. male who presents for Chief Complaint  Patient presents with   other    Got weak last Sunday after singing in church, EMT gave him fluids at church, went to the hospital Tuesday, gets very tired and weak, doesn't want to eat much,      Here for weakness.  He went to the emergency department this past weekend for the same.  Today did not really find a thing wrong told to follow-up with PCP.  He notes ongoing weakness.  He is compliant with his medications including his inhalers  He sees the [facility] as well but he denies any PFTs within the past year  He is supposed to be on a BiPAP and is awaiting 1 from the [place].  He is supposed to come in February  He denies any bleeding or bruising  He denies any current chest pain, palpitations, shortness of breath  Otherwise normal state of health  No other aggravating or relieving factors.    No other c/o.  Past Medical History:  Diagnosis Date   Alpha thalassemia trait    Atypical chest pain    CAD (coronary artery disease)    a. Multiple prior evaluations then 05/2012 s/p BMS to OM2. b. Nuc 11/2012 wnl. c. Cath 2015: patent stent, moderate LAD/RCA dz, treated medically.   Cataract    CHEST PAIN UNSPECIFIED 09/13/2008   Qualifier: Diagnosis of  By: Trevor Iha, RN, Heather     CKD (chronic kidney disease), stage II    COPD (chronic obstructive pulmonary disease) (HCC)    Cough secondary to angiotensin converting enzyme inhibitor (ACE-I)    Diabetes mellitus (HCC) 09/07/2019   Diabetes mellitus without complication (HCC)    Diverticulosis    DVT (deep venous thrombosis) (HCC)    Family history of breast cancer in first degree relative    GERD (gastroesophageal reflux disease)    Headache(784.0)    Hx: of   History of blood transfusion    "I've had 2; don't know what it was related to" (11/09/2012)   HLD (hyperlipidemia)    HTN (hypertension)    Iron deficiency anemia    Lymphoproliferative  disorder, low grade B cell (HCC) 05/21/2020   Microcytic anemia    PAF (paroxysmal atrial fibrillation) (HCC)    Peripheral vascular disease (HCC)    a. L pop-tibial bypass 2011, followed by [facility].   Personal history of prostate cancer    Presence of permanent cardiac pacemaker    Rotator cuff injury    "left arm; never repaired" (11/09/2012)   Current Outpatient Medications on File Prior to Visit  Medication Sig Dispense Refill   abiraterone acetate (ZYTIGA) 250 MG tablet Take 1,000 mg by mouth daily. On an empty stomach     Accu-Chek FastClix Lancets MISC Test once daily 102 each 12   amLODipine (NORVASC) 10 MG tablet TAKE 1 TABLET EVERY DAY 90 tablet 3   aspirin EC 81 MG tablet Take 1 tablet (81 mg total) by mouth daily. Swallow whole. 90 tablet 3   brimonidine (ALPHAGAN) 0.2 % ophthalmic solution Place 1 drop into both eyes 2 (two) times daily.     diclofenac Sodium (VOLTAREN) 1 % GEL Apply 2 g topically 2 (two) times daily as needed (back pain).     ferrous gluconate (FERGON) 324 MG tablet Take 1 tablet (324 mg total) by mouth daily with breakfast. 30 tablet 2   gabapentin (NEURONTIN) 600 MG tablet Take 600  mg by mouth 2 (two) times daily.     isosorbide mononitrate (IMDUR) 120 MG 24 hr tablet Take 1 tablet (120 mg total) by mouth daily. 90 tablet 3   latanoprost (XALATAN) 0.005 % ophthalmic solution Place 1 drop into both eyes at bedtime.     lidocaine (LIDODERM) 5 % Place 1 patch onto the skin daily.     nitroGLYCERIN (NITROSTAT) 0.4 MG SL tablet Place 1 tablet (0.4 mg total) under the tongue every 5 (five) minutes as needed for chest pain (max 3 doses). 75 tablet 2   omeprazole (PRILOSEC) 20 MG capsule Take 20 mg by mouth daily.     polyethylene glycol powder (GLYCOLAX/MIRALAX) 17 GM/SCOOP powder Take 17 g by mouth daily. 289 g 0   pravastatin (PRAVACHOL) 40 MG tablet TAKE 1 TABLET EVERY EVENING 90 tablet 3   predniSONE (DELTASONE) 5 MG tablet Take 5 mg by mouth in the morning.      silver sulfADIAZINE (SILVADENE) 1 % cream Apply 1 Application topically daily. 50 g 0   traMADol (ULTRAM) 50 MG tablet TAKE 1 TABLET(50 MG) BY MOUTH EVERY 12 HOURS AS NEEDED 30 tablet 0   tamsulosin (FLOMAX) 0.4 MG CAPS capsule Take 0.4 mg by mouth daily. (Patient not taking: Reported on 03/31/2023)     No current facility-administered medications on file prior to visit.     The following portions of the patient's history were reviewed and updated as appropriate: allergies, current medications, past family history, past medical history, past social history, past surgical history and problem list.  ROS Otherwise as in subjective above  Objective: BP 112/62   Pulse 64   Wt 226 lb 3.2 oz (102.6 kg)   SpO2 99%   BMI 33.40 kg/m   General appearance: alert, no distress, well developed, well nourished, seated with cane HEENT: normocephalic, sclerae anicteric, conjunctiva pink and moist, TMs pearly, nares patent, no discharge or erythema, pharynx normal Oral cavity: MMM, no lesions Neck: supple, no lymphadenopathy, no thyromegaly, no masses Heart: RRR, normal S1, S2, no murmurs Lungs: CTA bilaterally, no wheezes, rhonchi, or rales Pulses: 2+ radial pulses, 2+ pedal pulses, normal cap refill Ext: no edema   Assessment: Encounter Diagnoses  Name Primary?   Anemia, unspecified type Yes   Pulmonary emphysema, unspecified emphysema type (HCC)    Controlled type 2 diabetes mellitus with hyperglycemia, without long-term current use of insulin (HCC)    Fatigue, unspecified type    Iron deficiency anemia, unspecified iron deficiency anemia type    Vitamin deficiency    Does mobilize using cane    Medication management    Chronic obstructive pulmonary disease, unspecified COPD type (HCC)      Plan: We discussed symptoms and concerns.  I reviewed his recent emergency department notes from May 10 2023.  Chest x-ray showed no new findings, EKG reviewed, labs show consistent anemia with  hemoglobin around 9.  We discussed possible causes of fatigue which in his case could be related that he has been anemia and COPD or other issues  We will check labs as below including iron.  He is taking iron daily supplement but may need an IV fusion of iron  He is awaiting BiPAP through the [facility] that should be starting in February hopefully  Consider updated PFTs  He walked around the office today and his oxygen level did not drop below 97%  Continue to hydrate well  Joel [name] was seen today for other.  Diagnoses and all orders for  this visit:  Anemia, unspecified type -     Iron, TIBC and Ferritin Panel -     Folate -     Vitamin B12  Pulmonary emphysema, unspecified emphysema type (HCC)  Controlled type 2 diabetes mellitus with hyperglycemia, without long-term current use of insulin (HCC) -     Hemoglobin A1c  Fatigue, unspecified type -     Iron, TIBC and Ferritin Panel -     TSH -     Folate -     Vitamin B12 -     VITAMIN D 25 Hydroxy (Vit-D Deficiency, Fractures)  Iron deficiency anemia, unspecified iron deficiency anemia type -     Iron, TIBC and Ferritin Panel -     Folate -     Vitamin B12  Vitamin deficiency -     Folate -     Vitamin B12 -     VITAMIN D 25 Hydroxy (Vit-D Deficiency, Fractures)  Does mobilize using cane  Medication management -     albuterol (VENTOLIN HFA) 108 (90 Base) MCG/ACT inhaler; Inhale 2 puffs into the lungs every 6 (six) hours as needed for wheezing or shortness of breath.  Chronic obstructive pulmonary disease, unspecified COPD type (HCC) -     albuterol (VENTOLIN HFA) 108 (90 Base) MCG/ACT inhaler; Inhale 2 puffs into the lungs every 6 (six) hours as needed for wheezing or shortness of breath.  Other orders -     ipratropium-albuterol (DUONEB) 0.5-2.5 (3) MG/3ML SOLN; Take 3 mLs by nebulization every 6 (six) hours as needed. -     Budeson-Glycopyrrol-Formoterol (BREZTRI AEROSPHERE) 160-9-4.8 MCG/ACT AERO; Inhale 2  puffs into the lungs 2 (two) times daily.    Follow up: pending labs

## 2023-05-13 ENCOUNTER — Other Ambulatory Visit: Payer: Self-pay | Admitting: Medical

## 2023-05-13 DIAGNOSIS — H401230 Low-tension glaucoma, bilateral, stage unspecified: Secondary | ICD-10-CM | POA: Diagnosis not present

## 2023-05-13 DIAGNOSIS — H00035 Abscess of left lower eyelid: Secondary | ICD-10-CM | POA: Diagnosis not present

## 2023-05-13 DIAGNOSIS — Z961 Presence of intraocular lens: Secondary | ICD-10-CM | POA: Diagnosis not present

## 2023-05-13 DIAGNOSIS — H43811 Vitreous degeneration, right eye: Secondary | ICD-10-CM | POA: Diagnosis not present

## 2023-05-13 DIAGNOSIS — H00025 Hordeolum internum left lower eyelid: Secondary | ICD-10-CM | POA: Diagnosis not present

## 2023-05-13 DIAGNOSIS — C6982 Malignant neoplasm of overlapping sites of left eye and adnexa: Secondary | ICD-10-CM | POA: Diagnosis not present

## 2023-05-13 DIAGNOSIS — H35033 Hypertensive retinopathy, bilateral: Secondary | ICD-10-CM | POA: Diagnosis not present

## 2023-05-13 LAB — IRON,TIBC AND FERRITIN PANEL
Ferritin: 224 ng/mL (ref 30–400)
Iron Saturation: 12 % — ABNORMAL LOW (ref 15–55)
Iron: 31 ug/dL — ABNORMAL LOW (ref 38–169)
Total Iron Binding Capacity: 268 ug/dL (ref 250–450)
UIBC: 237 ug/dL (ref 111–343)

## 2023-05-13 LAB — FOLATE: Folate: 11.4 ng/mL (ref >3.0)

## 2023-05-13 LAB — TSH: TSH: 1.96 u[IU]/mL (ref 0.450–4.500)

## 2023-05-13 LAB — HEMOGLOBIN A1C
Est. average glucose Bld gHb Est-mCnc: 131 mg/dL
Hgb A1c MFr Bld: 6.2 % — ABNORMAL HIGH (ref 4.8–5.6)

## 2023-05-13 LAB — VITAMIN D 25 HYDROXY (VIT D DEFICIENCY, FRACTURES): Vit D, 25-Hydroxy: 22.4 ng/mL — ABNORMAL LOW (ref 30.0–100.0)

## 2023-05-13 LAB — VITAMIN B12: Vitamin B-12: 980 pg/mL (ref 232–1245)

## 2023-05-13 MED ORDER — FERROUS GLUCONATE 324 (38 FE) MG PO TABS: 324.0000 mg | ORAL_TABLET | Freq: Every day | ORAL | 2 refills | Status: DC

## 2023-05-13 MED ORDER — VITAMIN D (ERGOCALCIFEROL) 1.25 MG (50000 UNIT) PO CAPS: 50000.0000 [IU] | ORAL_CAPSULE | ORAL | 3 refills | Status: DC

## 2023-05-13 MED ORDER — POLYETHYLENE GLYCOL 3350 17 GM/SCOOP PO POWD: 17.0000 g | Freq: Every day | ORAL | 2 refills | Status: DC

## 2023-05-13 NOTE — Progress Notes (Signed)
Vitamin D is low, diabetes marker stable, iron is actually low   thyroid normal, folate normal, B12 normal  If agreeable please set up for IV iron infusion x 1  I will send prescription vitamin D to the pharmacy.  Continue other medicines as usual

## 2023-05-17 DIAGNOSIS — H0101B Ulcerative blepharitis left eye, upper and lower eyelids: Secondary | ICD-10-CM | POA: Diagnosis not present

## 2023-05-17 DIAGNOSIS — H0102A Squamous blepharitis right eye, upper and lower eyelids: Secondary | ICD-10-CM | POA: Diagnosis not present

## 2023-05-17 DIAGNOSIS — H401231 Low-tension glaucoma, bilateral, mild stage: Secondary | ICD-10-CM | POA: Diagnosis not present

## 2023-05-17 DIAGNOSIS — H0288A Meibomian gland dysfunction right eye, upper and lower eyelids: Secondary | ICD-10-CM | POA: Diagnosis not present

## 2023-05-17 DIAGNOSIS — H0288B Meibomian gland dysfunction left eye, upper and lower eyelids: Secondary | ICD-10-CM | POA: Diagnosis not present

## 2023-05-17 DIAGNOSIS — H00035 Abscess of left lower eyelid: Secondary | ICD-10-CM | POA: Diagnosis not present

## 2023-05-17 DIAGNOSIS — H0102B Squamous blepharitis left eye, upper and lower eyelids: Secondary | ICD-10-CM | POA: Diagnosis not present

## 2023-05-17 DIAGNOSIS — H43811 Vitreous degeneration, right eye: Secondary | ICD-10-CM | POA: Diagnosis not present

## 2023-05-17 DIAGNOSIS — H35033 Hypertensive retinopathy, bilateral: Secondary | ICD-10-CM | POA: Diagnosis not present

## 2023-05-17 NOTE — Transitions of Care (Post Inpatient/ED Visit) (Signed)
   05/17/2023  Name: Joel [name] MRN: [id number] DOB: 02/23/1941 Patient already seen in office Today's TOC FU Call Status: Today's TOC FU Call Status:: Unsuccessful Call (1st Attempt) Unsuccessful Call (1st Attempt) Date: 05/11/23  Attempted to reach the patient regarding the most recent Inpatient/ED visit.  Follow Up Plan: No further outreach attempts will be made at this time. We have been unable to contact the patient.  Signature Julian Lemmings, LPN [facility] Nurse Health Advisor Direct Dial [phone]

## 2023-05-17 NOTE — Transitions of Care (Post Inpatient/ED Visit) (Signed)
   05/17/2023  Name: Joel [name] MRN: [id number] DOB: [birth year]  Today's TOC FU Call Status: Today's TOC FU Call Status:: Unsuccessful Call (1st Attempt) Unsuccessful Call (1st Attempt) Date: 05/11/23  Attempted to reach the patient regarding the most recent Inpatient/ED visit.  Follow Up Plan: Additional outreach attempts will be made to reach the patient to complete the Transitions of Care (Post Inpatient/ED visit) call.   Signature Julian Lemmings, LPN [facility] Nurse Health Advisor Direct Dial [phone]

## 2023-05-18 ENCOUNTER — Telehealth: Payer: Self-pay | Admitting: Cardiovascular Disease

## 2023-05-18 NOTE — Telephone Encounter (Signed)
 Spoke with patient and he states lately he has ben feeling weak and have no energy. He has been to the ED twice but they can't find anything wrong.   He denies chest pain,SOB, headache, swelling, nausea or vomiting. He can not check his vitals at home but he states when he goes to his appointments his his vitals are normal.   He would like for you to review his ED notes and give your thoughts. He also is following up with PCP. ED precautions discussed

## 2023-05-18 NOTE — Telephone Encounter (Signed)
 Pt c/o medication issue:  1. Name of Medication: amLODipine  (NORVASC ) 10 MG tablet   isosorbide  mononitrate (IMDUR ) 120 MG 24 hr tablet  2. How are you currently taking this medication (dosage and times per day)? As directed  3. Are you having a reaction (difficulty breathing--STAT)? Weakness, drowsiness  4. What is your medication issue? Pt thinks that one of these medications are causing him to feel sleepy and have weakness, requesting cb to discuss

## 2023-05-19 ENCOUNTER — Ambulatory Visit: Payer: Medicare HMO | Admitting: Family Medicine

## 2023-05-19 ENCOUNTER — Telehealth: Payer: Self-pay | Admitting: Internal Medicine

## 2023-05-19 ENCOUNTER — Institutional Professional Consult (permissible substitution): Payer: Medicare HMO | Admitting: Pulmonary Disease

## 2023-05-19 NOTE — Telephone Encounter (Signed)
 Patient is scheduled for Iron Infusion tomorrow at 9:30am arrive 15 minutes early.  [facility] [address] [place] 3rd floor [place]  This will take up to an hour to complete.  Called and left message for pt to call back.  Wondering if he could come in at 1:20pm tomorrow  (Friday- 05/20/23) to see Ludie and NOT be seen today with Dr. Randol. He will probably feel better after infusion but if not,he can be seen at 1:20pm Friday with Ludie

## 2023-05-19 NOTE — Telephone Encounter (Signed)
 Informed patient of Dr. Jeneal Mins recommendation.  He has a PCP appointment tomorrow.  I don't see anything on his ED note that stands out as a clear cause for weakness.Agree with PCP follow up. Joel [name]

## 2023-05-20 ENCOUNTER — Ambulatory Visit (INDEPENDENT_AMBULATORY_CARE_PROVIDER_SITE_OTHER): Payer: Medicare HMO | Admitting: Medical

## 2023-05-20 ENCOUNTER — Non-Acute Institutional Stay (HOSPITAL_COMMUNITY)
Admission: RE | Admit: 2023-05-20 | Discharge: 2023-05-20 | Disposition: A | Discharge status: Home / Self Care | Payer: Medicare HMO | Source: Ambulatory Visit | Attending: Internal Medicine

## 2023-05-20 ENCOUNTER — Telehealth: Payer: Self-pay | Admitting: Internal Medicine

## 2023-05-20 VITALS — BP 122/72 | HR 58 | Wt 227.8 lb

## 2023-05-20 DIAGNOSIS — D649 Anemia, unspecified: Secondary | ICD-10-CM | POA: Insufficient documentation

## 2023-05-20 DIAGNOSIS — R5383 Other fatigue: Secondary | ICD-10-CM

## 2023-05-20 DIAGNOSIS — K219 Gastro-esophageal reflux disease without esophagitis: Secondary | ICD-10-CM

## 2023-05-20 DIAGNOSIS — E559 Vitamin D deficiency, unspecified: Secondary | ICD-10-CM

## 2023-05-20 DIAGNOSIS — I10 Essential (primary) hypertension: Secondary | ICD-10-CM | POA: Diagnosis not present

## 2023-05-20 DIAGNOSIS — D509 Iron deficiency anemia, unspecified: Secondary | ICD-10-CM | POA: Diagnosis not present

## 2023-05-20 DIAGNOSIS — E7800 Pure hypercholesterolemia, unspecified: Secondary | ICD-10-CM

## 2023-05-20 DIAGNOSIS — J439 Emphysema, unspecified: Secondary | ICD-10-CM

## 2023-05-20 MED ORDER — SODIUM CHLORIDE 0.9 % IV SOLN: INTRAVENOUS | Status: DC | PRN

## 2023-05-20 MED ORDER — FLUTICASONE-SALMETEROL 115-21 MCG/ACT IN AERO: 2.0000 | INHALATION_SPRAY | Freq: Two times a day (BID) | RESPIRATORY_TRACT | 12 refills | Status: DC

## 2023-05-20 MED ORDER — SODIUM CHLORIDE 0.9 % IV SOLN
510.0000 mg | Freq: Once | INTRAVENOUS | Status: AC
  Administered 2023-05-20: 510 mg via INTRAVENOUS
  Filled 2023-05-20: qty 17

## 2023-05-20 NOTE — Progress Notes (Signed)
 Subjective:  Joel [name] is a 83 y.o. male who presents for Chief Complaint  Patient presents with   Follow-up    Follow-up from iron infusion. Had infusion this morning and feels a lot better. No more weakness or dizziness. Does he need to take iron tablet?     Here for recheck on weakness.  I just saw him on May 12, 2023 for the same.  After last visit we had him initiate iron oral, set up for iron infusion and vitamin D  supplement.  He was low on iron and vitamin D   He called in yesterday wanting appointment yesterday for fatigue.  He did go for his iron infusion today and actually feels pretty good this afternoon much better than he has felt in recent weeks.  So he has no specific symptoms today.  He does have questions about his medications.  He wants to make sure he has the right information at home.  He thinks he is out of a couple medications.  He has questions specific by his inhalers.  No other aggravating or relieving factors.    No other c/o.  Past Medical History:  Diagnosis Date   Alpha thalassemia trait    Atypical chest pain    CAD (coronary artery disease)    a. Multiple prior evaluations then 05/2012 s/p BMS to OM2. b. Nuc 11/2012 wnl. c. Cath 2015: patent stent, moderate LAD/RCA dz, treated medically.   Cataract    CHEST PAIN UNSPECIFIED 09/13/2008   Qualifier: Diagnosis of  By: Buell, RN, Heather     CKD (chronic kidney disease), stage II    COPD (chronic obstructive pulmonary disease) (HCC)    Cough secondary to angiotensin converting enzyme inhibitor (ACE-I)    Diabetes mellitus (HCC) 09/07/2019   Diabetes mellitus without complication (HCC)    Diverticulosis    DVT (deep venous thrombosis) (HCC)    Family history of breast cancer in first degree relative    GERD (gastroesophageal reflux disease)    Headache(784.0)    Hx: of   History of blood transfusion    I've had 2; don't know what it was related to (11/09/2012)   HLD (hyperlipidemia)    HTN  (hypertension)    Iron deficiency anemia    Lymphoproliferative disorder, low grade B cell (HCC) 05/21/2020   Microcytic anemia    PAF (paroxysmal atrial fibrillation) (HCC)    Peripheral vascular disease (HCC)    a. L pop-tibial bypass 2011, followed by [facility].   Personal history of prostate cancer    Presence of permanent cardiac pacemaker    Rotator cuff injury    left arm; never repaired (11/09/2012)   Current Outpatient Medications on File Prior to Visit  Medication Sig Dispense Refill   abiraterone  acetate (ZYTIGA ) 250 MG tablet Take 1,000 mg by mouth daily. On an empty stomach     albuterol  (VENTOLIN  HFA) 108 (90 Base) MCG/ACT inhaler Inhale 2 puffs into the lungs every 6 (six) hours as needed for wheezing or shortness of breath. 8 g 1   amLODipine  (NORVASC ) 10 MG tablet TAKE 1 TABLET EVERY DAY 90 tablet 3   aspirin  EC 81 MG tablet Take 1 tablet (81 mg total) by mouth daily. Swallow whole. 90 tablet 3   brimonidine  (ALPHAGAN ) 0.2 % ophthalmic solution Place 1 drop into both eyes 2 (two) times daily.     Budeson-Glycopyrrol-Formoterol (BREZTRI  AEROSPHERE) 160-9-4.8 MCG/ACT AERO Inhale 2 puffs into the lungs 2 (two) times daily. 10.7 g 11  diclofenac  Sodium (VOLTAREN ) 1 % GEL Apply 2 g topically 2 (two) times daily as needed (back pain).     ferrous gluconate  (FERGON) 324 MG tablet Take 1 tablet (324 mg total) by mouth daily with breakfast. 30 tablet 2   gabapentin  (NEURONTIN ) 600 MG tablet Take 600 mg by mouth 2 (two) times daily.     ipratropium-albuterol  (DUONEB) 0.5-2.5 (3) MG/3ML SOLN Take 3 mLs by nebulization every 6 (six) hours as needed. 360 mL 1   isosorbide  mononitrate (IMDUR ) 120 MG 24 hr tablet Take 1 tablet (120 mg total) by mouth daily. 90 tablet 3   nitroGLYCERIN  (NITROSTAT ) 0.4 MG SL tablet Place 1 tablet (0.4 mg total) under the tongue every 5 (five) minutes as needed for chest pain (max 3 doses). 75 tablet 2   omeprazole (PRILOSEC) 20 MG capsule Take 20 mg by mouth  daily.     polyethylene glycol powder (GLYCOLAX /MIRALAX ) 17 GM/SCOOP powder Take 17 g by mouth daily. 765 g 2   pravastatin  (PRAVACHOL ) 40 MG tablet TAKE 1 TABLET EVERY EVENING 90 tablet 3   predniSONE  (DELTASONE ) 5 MG tablet Take 5 mg by mouth in the morning.     Vitamin D , Ergocalciferol , (DRISDOL ) 1.25 MG (50000 UNIT) CAPS capsule Take 1 capsule (50,000 Units total) by mouth every 7 (seven) days. 12 capsule 3   Accu-Chek FastClix Lancets MISC Test once daily 102 each 12   latanoprost  (XALATAN ) 0.005 % ophthalmic solution Place 1 drop into both eyes at bedtime.     tamsulosin  (FLOMAX ) 0.4 MG CAPS capsule Take 0.4 mg by mouth daily. (Patient not taking: Reported on 03/31/2023)     traMADol  (ULTRAM ) 50 MG tablet TAKE 1 TABLET(50 MG) BY MOUTH EVERY 12 HOURS AS NEEDED (Patient not taking: Reported on 05/20/2023) 30 tablet 0   No current facility-administered medications on file prior to visit.     The following portions of the patient's history were reviewed and updated as appropriate: allergies, current medications, past family history, past medical history, past social history, past surgical history and problem list.  ROS Otherwise as in subjective above  Objective: BP 122/72   Pulse (!) 58   Wt 227 lb 12.8 oz (103.3 kg)   SpO2 98%   BMI 33.64 kg/m   Wt Readings from Last 3 Encounters:  05/20/23 227 lb 12.8 oz (103.3 kg)  05/12/23 226 lb 3.2 oz (102.6 kg)  03/31/23 226 lb 12.8 oz (102.9 kg)   BP Readings from Last 3 Encounters:  05/20/23 122/72  05/20/23 (!) 128/58  05/12/23 112/62   General appearance: alert, no distress, well developed, well nourished, seated with cane     Assessment: Encounter Diagnoses  Name Primary?   Anemia, unspecified type Yes   Iron deficiency anemia, unspecified iron deficiency anemia type    Pure hypercholesterolemia    Gastroesophageal reflux disease, unspecified whether esophagitis present    Other fatigue    Essential hypertension     Pulmonary emphysema, unspecified emphysema type (HCC)    Vitamin D  deficiency       Plan: We discussed his recent visit for similar symptoms.  Fortunately feels pretty good today after getting his IV iron infusion today.  He had a few questions about his medications so we spent some time today going back over his medications and I printed out his list of medicines for him to help clarify any concerns  Given his recent visit about fatigue and weakness, we reviewed over his recent emergency department notes from  May 10 2023.  Chest x-ray showed no new findings, EKG reviewed, labs show consistent anemia with hemoglobin around 9.  We discussed possible causes of fatigue which in his case could be related that he has been anemia and COPD or other issues  He is awaiting BiPAP through the [facility] that should be starting in February hopefully   The following medication list and recommendations were printed for patient today and discussed:  COPD, breathing issues Continue albuterol  rescue inhaler 1 to 2 puffs every 4-6 hours AS NEEDED for short of breath, wheezing or coughing fits Continue Breztri  inhaler 2 puffs twice a day.  This is a twice daily every day medicine for maintenance to help your breathing.  It is a long-acting medication You also have DuoNeb ipratropium albuterol  medication liquid at home that you can use in your nebulizer AS NEEDED 2-3 times a day for shortness of breath and wheezing.  This is a substitute for albuterol  when you are at home and have access to your nebulizer machine  Iron deficiency Make sure you check your medications at home to see if you have ferrous gluconate  or Fergon iron 324 mg to take daily. I want you to take the iron daily for the next 3 months You had an iron infusion today and fortunate that gave you some improvements in your energy Lets recheck your blood count and iron in about a month   Vitamin D  deficiency Your recent labs show that your  vitamin D  is still low Continue vitamin D  50,000 unit WEEKLY capsule   High blood pressure Continue amlodipine  10 mg daily   High cholesterol Continue Pravachol  40 mg daily at bedtime Continue Aspirin  81mg  daily along with pravachol    Pain control You can use either over-the-counter Tylenol  for pain every 6 hours as needed such as 325 mg or 500 mg For worse pain you can use tramadol  Ultram  50 mg prescription, up to twice daily or every 12 hours as needed Continue gabapentin  600 mg twice daily for neuropathic pain Continue Voltaren  gel twice daily as needed topically for pain   Continue your Imdur  isosorbide  120 mg daily per your heart doctor   GERD/acid reflux Continue omeprazole Prilosec daily in the morning on empty stomach 30 minutes before breakfast   Prostate enlargement with urinary symptoms Continue Flomax  0.4 mg daily   Constipation Continue MiraLAX  as needed daily   Continue your eyedrops per your eye doctor as prescribed, Alphagan , Xalatan    Continue Zytiga  as directed per that prescribing doctor   Nitroglycerin  as as needed for chest pain  Hosey was seen today for follow-up.  Diagnoses and all orders for this visit:  Anemia, unspecified type  Iron deficiency anemia, unspecified iron deficiency anemia type  Pure hypercholesterolemia  Gastroesophageal reflux disease, unspecified whether esophagitis present  Other fatigue  Essential hypertension  Pulmonary emphysema, unspecified emphysema type (HCC)  Vitamin D  deficiency     Follow up: pending labs

## 2023-05-20 NOTE — Addendum Note (Signed)
 Addended by: Claudene Crystal on: 05/20/2023 03:41 PM   Modules accepted: Orders

## 2023-05-20 NOTE — Telephone Encounter (Signed)
 Fax from [facility]   Albuterol  sulfate hfa 90 mcg

## 2023-05-20 NOTE — Progress Notes (Signed)
 PATIENT CARE [facility]ENTER NOTE  Diagnosis: D64.9   Provider: Bulah Alm RIGGERS  Procedure: Feraheme 510 mg (dose #1 of 1)  Note: Patient received Feraheme 510 mg via PIV. No pre-medications per orders. Pt tolerated infusion with no adverse reaction. Pt observed for 30 minutes post infusion. AVS printed and given to pt. Pt is alert, oriented, and ambulatory with personal cane at discharge.

## 2023-05-20 NOTE — Patient Instructions (Addendum)
 COPD, breathing issues Continue albuterol  rescue inhaler 1 to 2 puffs every 4-6 hours AS NEEDED for short of breath, wheezing or coughing fits Continue Breztri  inhaler 2 puffs twice a day.  This is a twice daily every day medicine for maintenance to help your breathing.  It is a long-acting medication You also have DuoNeb ipratropium albuterol  medication liquid at home that you can use in your nebulizer AS NEEDED 2-3 times a day for shortness of breath and wheezing.  This is a substitute for albuterol  when you are at home and have access to your nebulizer machine  Iron deficiency Make sure you check your medications at home to see if you have ferrous gluconate  or Fergon iron 324 mg to take daily. I want you to take the iron daily for the next 3 months You had an iron infusion today and fortunate that gave you some improvements in your energy Lets recheck your blood count and iron in about a month   Vitamin D  deficiency Your recent labs show that your vitamin D  is still low Continue vitamin D  50,000 unit WEEKLY capsule   High blood pressure Continue amlodipine  10 mg daily   High cholesterol Continue Pravachol  40 mg daily at bedtime Continue Aspirin  81mg  daily along with pravachol    Pain control You can use either over-the-counter Tylenol  for pain every 6 hours as needed such as 325 mg or 500 mg For worse pain you can use tramadol  Ultram  50 mg prescription, up to twice daily or every 12 hours as needed Continue gabapentin  600 mg twice daily for neuropathic pain Continue Voltaren  gel twice daily as needed topically for pain   Continue your Imdur  isosorbide  120 mg daily per your heart doctor   GERD/acid reflux Continue omeprazole Prilosec daily in the morning on empty stomach 30 minutes before breakfast   Prostate enlargement with urinary symptoms Continue Flomax  0.4 mg daily   Constipation Continue MiraLAX  as needed daily   Continue your eyedrops per your eye doctor as  prescribed, Alphagan , Xalatan    Continue Zytiga  as directed per that prescribing doctor   Nitroglycerin  as as needed for chest pain

## 2023-05-20 NOTE — Telephone Encounter (Signed)
 Pt called and states Breztri  is over $200 and he can not afford this

## 2023-05-21 ENCOUNTER — Other Ambulatory Visit: Payer: Self-pay | Admitting: Medical

## 2023-05-21 DIAGNOSIS — J449 Chronic obstructive pulmonary disease, unspecified: Secondary | ICD-10-CM

## 2023-05-21 DIAGNOSIS — Z79899 Other long term (current) drug therapy: Secondary | ICD-10-CM

## 2023-05-21 MED ORDER — ALBUTEROL SULFATE HFA 108 (90 BASE) MCG/ACT IN AERS: 2.0000 | INHALATION_SPRAY | Freq: Four times a day (QID) | RESPIRATORY_TRACT | 0 refills | Status: DC | PRN

## 2023-05-23 NOTE — Telephone Encounter (Signed)
 This was already sent.

## 2023-05-25 DIAGNOSIS — H43811 Vitreous degeneration, right eye: Secondary | ICD-10-CM | POA: Diagnosis not present

## 2023-05-25 DIAGNOSIS — H0288A Meibomian gland dysfunction right eye, upper and lower eyelids: Secondary | ICD-10-CM | POA: Diagnosis not present

## 2023-05-25 DIAGNOSIS — H0288B Meibomian gland dysfunction left eye, upper and lower eyelids: Secondary | ICD-10-CM | POA: Diagnosis not present

## 2023-05-25 DIAGNOSIS — H00035 Abscess of left lower eyelid: Secondary | ICD-10-CM | POA: Diagnosis not present

## 2023-05-25 DIAGNOSIS — H0102A Squamous blepharitis right eye, upper and lower eyelids: Secondary | ICD-10-CM | POA: Diagnosis not present

## 2023-05-25 DIAGNOSIS — H35033 Hypertensive retinopathy, bilateral: Secondary | ICD-10-CM | POA: Diagnosis not present

## 2023-05-25 DIAGNOSIS — H401230 Low-tension glaucoma, bilateral, stage unspecified: Secondary | ICD-10-CM | POA: Diagnosis not present

## 2023-05-25 DIAGNOSIS — C6982 Malignant neoplasm of overlapping sites of left eye and adnexa: Secondary | ICD-10-CM | POA: Diagnosis not present

## 2023-05-25 DIAGNOSIS — H0101B Ulcerative blepharitis left eye, upper and lower eyelids: Secondary | ICD-10-CM | POA: Diagnosis not present

## 2023-05-30 ENCOUNTER — Telehealth: Payer: Self-pay | Admitting: Medical

## 2023-05-30 NOTE — Telephone Encounter (Signed)
 Pt called and states that his breztri is over 400 dollars and he can not afford it  Please assist pt with adviar second option I called the pharmacy it is 265,  We gave pt 2 samples of the breztri to last him,

## 2023-05-31 ENCOUNTER — Telehealth: Payer: Self-pay

## 2023-05-31 NOTE — Telephone Encounter (Signed)
 PAP: Patient assistance application for Breztri through [employer] (AZ[employer]) has been mailed to pt's home address on file. Provider portion of application will be faxed to provider's office.

## 2023-06-03 NOTE — Telephone Encounter (Signed)
 Received provider pages, waiting on Patient application to be returned.

## 2023-06-09 DIAGNOSIS — R519 Headache, unspecified: Secondary | ICD-10-CM | POA: Diagnosis not present

## 2023-06-09 DIAGNOSIS — M542 Cervicalgia: Secondary | ICD-10-CM | POA: Diagnosis not present

## 2023-06-09 DIAGNOSIS — M791 Myalgia, unspecified site: Secondary | ICD-10-CM | POA: Diagnosis not present

## 2023-06-09 DIAGNOSIS — G518 Other disorders of facial nerve: Secondary | ICD-10-CM | POA: Diagnosis not present

## 2023-06-10 ENCOUNTER — Ambulatory Visit (INDEPENDENT_AMBULATORY_CARE_PROVIDER_SITE_OTHER): Payer: Medicare HMO

## 2023-06-10 DIAGNOSIS — I459 Conduction disorder, unspecified: Secondary | ICD-10-CM | POA: Diagnosis not present

## 2023-06-11 LAB — CUP PACEART REMOTE DEVICE CHECK
Battery Remaining Longevity: 79 mo
Battery Remaining Percentage: 82 %
Battery Voltage: 2.99 V
Brady Statistic AP VP Percent: 71 %
Brady Statistic AP VS Percent: 1 %
Brady Statistic AS VP Percent: 27 %
Brady Statistic AS VS Percent: 1 %
Brady Statistic RA Percent Paced: 71 %
Brady Statistic RV Percent Paced: 99 %
Date Time Interrogation Session: 20250228062034
Implantable Lead Connection Status: 753985
Implantable Lead Connection Status: 753985
Implantable Lead Implant Date: 20230525
Implantable Lead Implant Date: 20230525
Implantable Lead Location: 753859
Implantable Lead Location: 753860
Implantable Pulse Generator Implant Date: 20230525
Lead Channel Impedance Value: 410 Ohm
Lead Channel Impedance Value: 410 Ohm
Lead Channel Pacing Threshold Amplitude: 0.75 V
Lead Channel Pacing Threshold Amplitude: 0.75 V
Lead Channel Pacing Threshold Pulse Width: 0.5 ms
Lead Channel Pacing Threshold Pulse Width: 0.5 ms
Lead Channel Sensing Intrinsic Amplitude: 10.4 mV
Lead Channel Sensing Intrinsic Amplitude: 5 mV
Lead Channel Setting Pacing Amplitude: 2 V
Lead Channel Setting Pacing Amplitude: 2.5 V
Lead Channel Setting Pacing Pulse Width: 0.5 ms
Lead Channel Setting Sensing Sensitivity: 2.5 mV
Pulse Gen Model: 2272
Pulse Gen Serial Number: 8084442

## 2023-06-21 NOTE — Telephone Encounter (Signed)
 Following up on pt assistance application for [employer] (Breztri)spoke  with pt explain he has mail out application yesterday 06/20/23 may received in a couple of day will follow up.

## 2023-06-22 NOTE — Telephone Encounter (Signed)
 PAP: Application for Markus Daft has been submitted to [employer] ([employer]), via fax

## 2023-06-23 NOTE — Telephone Encounter (Signed)
 PAP: Patient assistance application for Markus Daft has been approved by PAP Companies: [employer] from 06/23/2023 to 04/11/2024. Medication should be delivered to PAP Delivery: Home. For further shipping updates, please contact [employer] ([employer]) at [phone]. Patient ID is: [id number] Shipment will process in 7-10 bus. Days from 06/23/2023

## 2023-06-29 DIAGNOSIS — M5116 Intervertebral disc disorders with radiculopathy, lumbar region: Secondary | ICD-10-CM | POA: Diagnosis not present

## 2023-06-29 DIAGNOSIS — M5416 Radiculopathy, lumbar region: Secondary | ICD-10-CM | POA: Diagnosis not present

## 2023-07-06 ENCOUNTER — Ambulatory Visit: Payer: Medicare HMO | Admitting: Pulmonary Disease

## 2023-07-08 NOTE — Progress Notes (Signed)
 Remote pacemaker transmission.

## 2023-07-08 NOTE — Addendum Note (Signed)
 Addended by: Elease Etienne A on: 07/08/2023 09:39 AM   Modules accepted: Orders

## 2023-07-18 DIAGNOSIS — M5116 Intervertebral disc disorders with radiculopathy, lumbar region: Secondary | ICD-10-CM | POA: Diagnosis not present

## 2023-07-21 ENCOUNTER — Encounter: Payer: Self-pay | Admitting: Cardiovascular Disease

## 2023-07-21 ENCOUNTER — Ambulatory Visit: Payer: Medicare HMO | Attending: Cardiovascular Disease | Admitting: Cardiovascular Disease

## 2023-07-21 VITALS — BP 110/68 | HR 60 | Ht 69.0 in | Wt 230.6 lb

## 2023-07-21 DIAGNOSIS — I25118 Atherosclerotic heart disease of native coronary artery with other forms of angina pectoris: Secondary | ICD-10-CM | POA: Diagnosis not present

## 2023-07-21 DIAGNOSIS — I7121 Aneurysm of the ascending aorta, without rupture: Secondary | ICD-10-CM

## 2023-07-21 DIAGNOSIS — I10 Essential (primary) hypertension: Secondary | ICD-10-CM

## 2023-07-21 DIAGNOSIS — I459 Conduction disorder, unspecified: Secondary | ICD-10-CM

## 2023-07-21 DIAGNOSIS — E782 Mixed hyperlipidemia: Secondary | ICD-10-CM | POA: Diagnosis not present

## 2023-07-21 DIAGNOSIS — I480 Paroxysmal atrial fibrillation: Secondary | ICD-10-CM

## 2023-07-21 NOTE — Patient Instructions (Signed)
 Medication Instructions:  No changes *If you need a refill on your cardiac medications before your next appointment, please call your pharmacy*  Lab Work: Go to [facility] for blood work (lipids) You may go to any of these [facility] locations:   [place] - [address] ([facility]) - [address] [phone] [address]   [place] - [address]    [place]  - [address]    [place] - [address] - [address] [place]  - [address] [place] [address] ([facility])  [place]   - [address]  If you have labs (blood work) drawn today and your tests are completely normal, you will receive your results only by: [employer] (if you have MyChart) OR A paper copy in the mail If you have any lab test that is abnormal or we need to change your treatment, we will call you to review the results.  Testing/Procedures: Chest CT Angiogram - Aorta  Follow-Up: At [facility], you and your health needs are our priority.  As part of our continuing mission to provide you with exceptional heart care, our providers are all part of one team.  This team includes your primary Cardiologist (physician) and Advanced Practice Providers or APPs (Physician Assistants and Nurse Practitioners) who all work together to provide you with the care you need, when you need it.  Your next appointment:   12 month(s)  Provider:   Verne Carrow, MD    We recommend signing up for the patient portal called "MyChart".  Sign up information is provided on this After Visit Summary.  MyChart is used to connect with patients for Virtual Visits (Telemedicine).  Patients are able to view lab/test results, encounter notes, upcoming appointments, etc.  Non-urgent messages can be sent to your provider as well.   To learn more about what you can do  with MyChart, go to [email or web address].      1st Floor: - Lobby - Registration  - Pharmacy  - Lab - Cafe  2nd Floor: - PV Lab - Diagnostic Testing (echo, CT, nuclear med)  3rd Floor: - Vacant  4th Floor: - TCTS (cardiothoracic surgery) - AFib Clinic - Structural Heart Clinic - Vascular Surgery  - Vascular Ultrasound  5th Floor: - HeartCare Cardiology (general and EP) - Clinical Pharmacy for coumadin, hypertension, lipid, weight-loss medications, and med management appointments    Valet parking services will be available as well.

## 2023-07-21 NOTE — Progress Notes (Signed)
 Chief Complaint  Patient presents with   Follow-up    CAD    History of Present Illness: 83 yo male with history of HTN, HLD, CAD, PAD, DM, MALT of orbit, heart block s/p pacemaker placement, paroxysmal atrial fibrillation, sleep apnea on CPAP, thoracic aortic aneurysm, aortic valve insufficiency and NSVT who is here today for cardiac follow up. Cardiac cath February 2014 severe OM2 stenosis treated with a bare metal stent. Since then he has had chronic angina and has been seen in the office and ED many times. Nuclear stress test November 2018 with LVEF 55% with moderate inferior defect that partially improves consistent with ischemia and possible soft tissue attenuation.  He was seen in the ED April 2019, October 2019, April 2020, May 2020 and June 2020 with atypical chest pain. Cardiac cath April 2021 with moderate diffuse CAD but no focally obstructive lesions except in the PDA which is too small for stenting. He was seen in the ED January 2022 and May 2022 with chest pain. Troponin negative.  Nuclear stress test in January 2023 with no ischemia. He had complete heart block in April 2023 and had a pacemaker placed in May 2023. His device showed episodes of atrial fib. He was started on anti-coagulation but developed GI bleeding. His bleeding resolved when the anti-coagulation was stopped. ASA was also stopped. He had attempted Watchman placement in May 2024 but the device could not be delivered. Cardiac CT in February 2024 arranged in planning for Watchman with 4.7 cm ascending aortic aneurysm. TEE May 2024 with LVEF=60-65%. No valve disease. He is now using CPAP.   He has MALT of his orbit followed at [facility]. His PAD is followed in [facility].   He is here today for follow up. The patient denies any chest pain, dyspnea, palpitations, lower extremity edema, orthopnea, PND, dizziness, near syncope or syncope.   Primary Care Physician:  Genia Del  Past Medical History:   Diagnosis Date   Alpha thalassemia trait    Atypical chest pain    CAD (coronary artery disease)    a. Multiple prior evaluations then 05/2012 s/p BMS to OM2. b. Nuc 11/2012 wnl. c. Cath 2015: patent stent, moderate LAD/RCA dz, treated medically.   Cataract    CHEST PAIN UNSPECIFIED 09/13/2008   Qualifier: Diagnosis of  By: Trevor Iha, RN, Heather     CKD (chronic kidney disease), stage II    COPD (chronic obstructive pulmonary disease) (HCC)    Cough secondary to angiotensin converting enzyme inhibitor (ACE-I)    Diabetes mellitus (HCC) 09/07/2019   Diabetes mellitus without complication (HCC)    Diverticulosis    DVT (deep venous thrombosis) (HCC)    Family history of breast cancer in first degree relative    GERD (gastroesophageal reflux disease)    Headache(784.0)    Hx: of   History of blood transfusion    "I've had 2; don't know what it was related to" (11/09/2012)   HLD (hyperlipidemia)    HTN (hypertension)    Iron deficiency anemia    Lymphoproliferative disorder, low grade B cell (HCC) 05/21/2020   Microcytic anemia    PAF (paroxysmal atrial fibrillation) (HCC)    Peripheral vascular disease (HCC)    a. L pop-tibial bypass 2011, followed by [facility].   Personal history of prostate cancer    Presence of permanent cardiac pacemaker    Rotator cuff injury    "left arm; never repaired" (11/09/2012)    Past Surgical  History:  Procedure Laterality Date   CARDIOVASCULAR STRESS TEST  Aug. 2014   CATARACT EXTRACTION W/ INTRAOCULAR LENS IMPLANT Right ~ 2008   CIRCUMCISION     COLONOSCOPY     CORONARY ANGIOPLASTY WITH STENT PLACEMENT  2014   "1" (11/09/2012)   ILIAC ARTERY ANEURYSM REPAIR     LEFT ATRIAL APPENDAGE OCCLUSION N/A 09/02/2022   Procedure: LEFT ATRIAL APPENDAGE OCCLUSION;  Surgeon: Lanier Prude, MD;  Location: [facility] INVASIVE CV LAB;  Service: Cardiovascular;  Laterality: N/A;   LEFT HEART CATH  05-31-12   LEFT HEART CATH AND CORONARY ANGIOGRAPHY N/A 08/01/2019    Procedure: LEFT HEART CATH AND CORONARY ANGIOGRAPHY;  Surgeon: Kathleene Hazel, MD;  Location: [facility] INVAS[facility] CV LAB;  Service: Cardiovascular;  Laterality: N/A;   LEFT HEART CATHETERIZATION WITH CORONARY ANGIOGRAM N/A 06/01/2011   Procedure: LEFT HEART CATHETERIZATION WITH CORONARY ANGIOGRAM;  Surgeon: Herby Abraham, MD;  Location: [facility] CATH LAB;  Service: Cardiovascular;  Laterality: N/A;   LEFT HEART CATHETERIZATION WITH CORONARY ANGIOGRAM N/A 05/31/2012   Procedure: LEFT HEART CATHETERIZATION WITH CORONARY ANGIOGRAM;  Surgeon: Peter M Swaziland, MD;  Location: [facility] CATH LAB;  Service: Cardiovascular;  Laterality: N/A;   LEFT HEART CATHETERIZATION WITH CORONARY ANGIOGRAM N/A 08/01/2013   Procedure: LEFT HEART CATHETERIZATION WITH CORONARY ANGIOGRAM;  Surgeon: Kathleene Hazel, MD;  Location: [facility] CATH LAB;  Service: Cardiovascular;  Laterality: N/A;   PACEMAKER IMPLANT N/A 09/03/2021   Procedure: PACEMAKER IMPLANT;  Surgeon: Marinus Maw, MD;  Location: [facility] INVASIVE CV LAB;  Service: Cardiovascular;  Laterality: N/A;   PERCUTANEOUS CORONARY STENT INTERVENTION (PCI-S)  05/31/2012   Procedure: PERCUTANEOUS CORONARY STENT INTERVENTION (PCI-S);  Surgeon: Peter M Swaziland, MD;  Location[facility] CATH LAB;  Service: Cardiovascular;;   PR VEIN BYPASS GRAFT,AORTO-FEM-POP Left 10/01/2009   left below knee popliteal artery to posterior tibial artery   SHOULDER ARTHROSCOPY WITH OPEN ROTATOR CUFF REPAIR AND DISTAL CLAVICLE ACROMINECTOMY Left 01/12/2013   Procedure: LEFT SHOULDER ARTHROSCOPY WITH DEBRIDEMENT OPEN DISTAL CLAVICLE RESECTION ,acromioplastyAND ROTATOR CUFF REPAIR;  Surgeon: Thera Flake., MD;  Location: [facility] OR;  Service: Orthopedics;  Laterality: Left;   TEE WITHOUT CARDIOVERSION N/A 09/02/2022   Procedure: TRANSESOPHAGEAL ECHOCARDIOGRAM;  Surgeon: Lanier Prude, MD;  [facility] INVASIVE CV LAB;  Service: Cardiovascular;  Laterality: N/A;    Current Outpatient Medications  Medication Sig  Dispense Refill   abiraterone acetate (ZYTIGA) 250 MG tablet Take 1,000 mg by mouth daily. On an empty stomach     Accu-Chek FastClix Lancets MISC Test once daily 102 each 12   albuterol (VENTOLIN HFA) 108 (90 Base) MCG/ACT inhaler Inhale 2 puffs into the lungs every 6 (six) hours as needed for wheezing or shortness of breath. 24 g 0   amLODipine (NORVASC) 10 MG tablet TAKE 1 TABLET EVERY DAY 90 tablet 3   aspirin EC 81 MG tablet Take 1 tablet (81 mg total) by mouth daily. Swallow whole. 90 tablet 3   brimonidine (ALPHAGAN) 0.2 % ophthalmic solution Place 1 drop into both eyes 2 (two) times daily.     diclofenac Sodium (VOLTAREN) 1 % GEL Apply 2 g topically 2 (two) times daily as needed (back pain).     ferrous gluconate (FERGON) 324 MG tablet Take 1 tablet (324 mg total) by mouth daily with breakfast. 30 tablet 2   fluticasone-salmeterol (ADVAIR HFA) 115-21 MCG/ACT inhaler Inhale 2 puffs into the lungs 2 (two) times daily. 1 each 12   gabapentin (NEURONTIN) 600 MG  tablet Take 600 mg by mouth 2 (two) times daily.     ipratropium-albuterol (DUONEB) 0.5-2.5 (3) MG/3ML SOLN Take 3 mLs by nebulization every 6 (six) hours as needed. 360 mL 1   isosorbide mononitrate (IMDUR) 120 MG 24 hr tablet Take 1 tablet (120 mg total) by mouth daily. 90 tablet 3   latanoprost (XALATAN) 0.005 % ophthalmic solution Place 1 drop into both eyes at bedtime.     nitroGLYCERIN (NITROSTAT) 0.4 MG SL tablet Place 1 tablet (0.4 mg total) under the tongue every 5 (five) minutes as needed for chest pain (max 3 doses). 75 tablet 2   omeprazole (PRILOSEC) 20 MG capsule Take 20 mg by mouth daily.     polyethylene glycol powder (GLYCOLAX/MIRALAX) 17 GM/SCOOP powder Take 17 g by mouth daily. 765 g 2   pravastatin (PRAVACHOL) 40 MG tablet TAKE 1 TABLET EVERY EVENING 90 tablet 3   predniSONE (DELTASONE) 5 MG tablet Take 5 mg by mouth in the morning.     tamsulosin (FLOMAX) 0.4 MG CAPS capsule Take 0.4 mg by mouth daily.     traMADol  (ULTRAM) 50 MG tablet TAKE 1 TABLET(50 MG) BY MOUTH EVERY 12 HOURS AS NEEDED 30 tablet 0   No current facility-administered medications for this visit.    Allergies  Allergen Reactions   Topiramate Shortness Of Breath and Other (See Comments)     leg cramps   Atenolol Other (See Comments)    bradycardia   Lisinopril Cough   Tramadol-Acetaminophen Cough    Tolerates plain Tramadol    Social History   Socioeconomic History   Marital status: Widowed    Spouse name: Not on file   Number of children: Not on file   Years of education: Not on file   Highest education level: 12th grade  Occupational History   Occupation: Retired  Tobacco Use   Smoking status: Former    Current packs/day: 0.00    Average packs/day: 0.5 packs/day for 52.0 years (26.0 ttl pk-yrs)    Types: Cigarettes    Start date: 05/26/1959    Quit date: 05/26/2011    Years since quitting: 12.1    Passive exposure: Past   Smokeless tobacco: Never  Vaping Use   Vaping status: Never Used  Substance and Sexual Activity   Alcohol use: No   Drug use: No   Sexual activity: Not on file  Other Topics Concern   Not on file  Social History Narrative   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  Lives in [place] by himself.      Patient writes with his right hand, though uses his left hand for every thing else. He lives alone in a one level home. He drinks 3 cups of coffee a day and 3 7-8 oz sodas a day. He does not exercise.   Social Drivers of Corporate investment banker Strain: Low Risk  (06/01/2022)   Overall Financial Resource Strain (CARDIA)    Difficulty of Paying Living Expenses: Not hard at all  Food Insecurity: No Food Insecurity (07/16/2022)   Hunger Vital Sign    Worried About Running Out of Food in the Last Year: Never true    Ran Out of Food in the Last Year: Never true  Transportation Needs: No Transportation Needs (07/16/2022)   PRAPARE - Administrator, Civil Service (Medical): No     Lack of Transportation (Non-Medical): No  Physical Activity: Inactive (06/01/2022)   Exercise Vital Sign  Days of Exercise per Week: 0 days    Minutes of Exercise per Session: 0 min  Stress: No Stress Concern Present (06/01/2022)   [employer] of Occupational Health - Occupational Stress Questionnaire    Feeling of Stress : Not at all  Social Connections: Unknown (08/21/2021)   [facility]   Social Network    Social Network: Not on file  Intimate Partner Violence: Unknown (07/13/2021)   [facility]   HITS    Physically Hurt: Not on file    Insult or Talk Down To: Not on file    Threaten Physical Harm: Not on file    Scream or Curse: Not on file    Family History  Problem Relation Age of Onset   Breast cancer Mother        dx 55s   Hyperlipidemia Mother    Hypertension Mother    COPD Father        deceased   Hyperlipidemia Father    Hypertension Father    Hypertension Sister    Cancer Brother 28       Unknown cancer, possibly stomach   Hypertension Brother    Cancer Brother 10       Unknown, possibly lung   Hypertension Daughter    Hypertension Son    Colon cancer Neg Hx        pt is unclear about family hx    Review of Systems:  As stated in the HPI and otherwise negative.   BP 110/68   Pulse 60   Ht 5\' 9"  (1.753 m)   Wt 104.6 kg   BMI 34.05 kg/m   Physical Examination:  General: Well developed, well nourished, NAD  HEENT: OP clear, mucus membranes moist  SKIN: warm, dry. No rashes. Neuro: No focal deficits  Musculoskeletal: Muscle strength 5/5 all ext  Psychiatric: Mood and affect normal  Neck: No JVD, no carotid bruits, no thyromegaly, no lymphadenopathy.  Lungs:Clear bilaterally, no wheezes, rhonci, crackles Cardiovascular: Regular rate and rhythm. No murmurs, gallops or rubs. Abdomen:Soft. Bowel sounds present. Non-tender.  Extremities: No lower extremity edema. Pulses are 2 + in the  bilateral DP/PT.  EKG:  EKG is not ordered today. The ekg ordered today demonstrates   Recent Labs: 01/31/2023: Magnesium 1.8 03/04/2023: ALT 11 05/10/2023: BUN 12; Creatinine, Ser 1.14; Hemoglobin 9.2; Platelets 219; Potassium 4.0; Sodium 134 05/12/2023: TSH 1.960    Wt Readings from Last 3 Encounters:  07/21/23 104.6 kg  05/20/23 103.3 kg  05/12/23 102.6 kg    Assessment and Plan:   1. CAD with stable angina: Most recent cath April 2021 with stable CAD, severe stenosis in small PDA that is too small for stenting. Otherwise mild to moderate disease in all three vessels. Nuclear stress test in January 2023 with no ischemia.  Continue ASA, Norvasc, statin and Imdur      2. HTN: BP is well controlled. Continue current therapy   3. Hyperlipidemia: LDL well controlled in 2023. Continue statin.   Lipids today. LFTs normal in November 2024.   4. Complete heart block: Pacemaker in place. Followed in EP clinic by Dr. Ladona Ridgel  5. Paroxysmal atrial fibrillation: sinus today. Failed attempt at [facility] placement. He does not wish to restart Eliquis.   6. Thoracic aortic aneurysm: 4.7 cm aneurysm by chest CTA in February 2024. Will repeat chest CTA now.    Labs/ tests ordered today include:   Orders Placed This Encounter  Procedures   CT ANGIO CHEST AORTA W/CM & OR WO/CM   Lipid Profile   Disposition:   F/U with me in one year.   Signed, Verne Carrow, MD 07/21/2023 4:50 PM    [facility] [address] [place], [place], [place]  [address] Phone: [phone]; Fax: [phone]

## 2023-07-22 LAB — LIPID PANEL
Chol/HDL Ratio: 3.7 ratio (ref 0.0–5.0)
Cholesterol, Total: 142 mg/dL (ref 100–199)
HDL: 38 mg/dL — ABNORMAL LOW (ref >39)
LDL Chol Calc (NIH): 48 mg/dL (ref 0–99)
Triglycerides: 378 mg/dL — ABNORMAL HIGH (ref 0–149)
VLDL Cholesterol Cal: 56 mg/dL — ABNORMAL HIGH (ref 5–40)

## 2023-08-02 ENCOUNTER — Ambulatory Visit (HOSPITAL_COMMUNITY)
Admission: RE | Admit: 2023-08-02 | Discharge: 2023-08-02 | Disposition: A | Discharge status: Home / Self Care | Source: Ambulatory Visit | Attending: Cardiovascular Disease | Admitting: Cardiovascular Disease

## 2023-08-02 DIAGNOSIS — I7121 Aneurysm of the ascending aorta, without rupture: Secondary | ICD-10-CM | POA: Insufficient documentation

## 2023-08-02 DIAGNOSIS — I517 Cardiomegaly: Secondary | ICD-10-CM | POA: Diagnosis not present

## 2023-08-02 DIAGNOSIS — K7689 Other specified diseases of liver: Secondary | ICD-10-CM | POA: Diagnosis not present

## 2023-08-02 DIAGNOSIS — I2510 Atherosclerotic heart disease of native coronary artery without angina pectoris: Secondary | ICD-10-CM | POA: Diagnosis not present

## 2023-08-02 MED ORDER — IOHEXOL 350 MG/ML SOLN
80.0000 mL | Freq: Once | INTRAVENOUS | Status: AC | PRN
  Administered 2023-08-02: 80 mL via INTRAVENOUS

## 2023-08-08 LAB — POCT I-STAT CREATININE: Creatinine, Ser: 1.1 mg/dL (ref 0.61–1.24)

## 2023-08-11 ENCOUNTER — Other Ambulatory Visit: Payer: Self-pay

## 2023-08-11 DIAGNOSIS — I7121 Aneurysm of the ascending aorta, without rupture: Secondary | ICD-10-CM

## 2023-08-15 DIAGNOSIS — M5116 Intervertebral disc disorders with radiculopathy, lumbar region: Secondary | ICD-10-CM | POA: Diagnosis not present

## 2023-08-15 DIAGNOSIS — Z1330 Encounter for screening examination for mental health and behavioral disorders, unspecified: Secondary | ICD-10-CM | POA: Diagnosis not present

## 2023-08-18 ENCOUNTER — Ambulatory Visit: Admitting: Emergency Medicine

## 2023-08-18 ENCOUNTER — Ambulatory Visit (INDEPENDENT_AMBULATORY_CARE_PROVIDER_SITE_OTHER): Payer: Medicare HMO | Admitting: Otolaryngology

## 2023-08-18 ENCOUNTER — Encounter: Payer: Self-pay | Admitting: Emergency Medicine

## 2023-08-18 VITALS — BP 115/61 | HR 61 | Ht 69.0 in | Wt 228.8 lb

## 2023-08-18 DIAGNOSIS — I7121 Aneurysm of the ascending aorta, without rupture: Secondary | ICD-10-CM

## 2023-08-18 DIAGNOSIS — C61 Malignant neoplasm of prostate: Secondary | ICD-10-CM

## 2023-08-18 DIAGNOSIS — R053 Chronic cough: Secondary | ICD-10-CM

## 2023-08-18 DIAGNOSIS — E662 Morbid (severe) obesity with alveolar hypoventilation: Secondary | ICD-10-CM | POA: Diagnosis not present

## 2023-08-18 DIAGNOSIS — J439 Emphysema, unspecified: Secondary | ICD-10-CM | POA: Diagnosis not present

## 2023-08-18 DIAGNOSIS — K219 Gastro-esophageal reflux disease without esophagitis: Secondary | ICD-10-CM | POA: Diagnosis not present

## 2023-08-18 DIAGNOSIS — Z87891 Personal history of nicotine dependence: Secondary | ICD-10-CM

## 2023-08-18 DIAGNOSIS — I719 Aortic aneurysm of unspecified site, without rupture: Secondary | ICD-10-CM | POA: Insufficient documentation

## 2023-08-18 NOTE — Assessment & Plan Note (Signed)
 On BiPAP and tolerates okay.  Some discomfort.  Managed at cardiology

## 2023-08-18 NOTE — Assessment & Plan Note (Signed)
 He has been trialed on scheduled BD therapy plus ICS, unclear how much COPD he has.  I will hold the Breztri  and/or Advair for now and get formal PFT to quantify his degree of obstruction.

## 2023-08-18 NOTE — Patient Instructions (Signed)
 VISIT SUMMARY:  You visited today to address your chronic cough, which has been ongoing for eight months. We discussed your medical history, including your coronary artery disease, obstructive sleep apnea, and GERD, and reviewed your current medications.  YOUR PLAN:  -CHRONIC COUGH: Your chronic cough may be related to your GERD. We will temporarily increase your omeprazole dosage to see if it helps. We will also perform pulmonary function tests to check for any changes related to your past smoking. You should continue using your albuterol  or nebulizer as needed, and we will review all your medications at your next visit.  Stop the Advair and Breztri  for now  -GASTROESOPHAGEAL REFLUX DISEASE (GERD): GERD can cause symptoms like heartburn and may contribute to your chronic cough. We will temporarily increase your omeprazole dosage to 20 mg twice a day see if it helps with your cough.  -OBSTRUCTIVE SLEEP APNEA: Obstructive sleep apnea is a condition where your breathing stops and starts during sleep. It is currently managed with your BiPAP machine, and there are no major issues with its usage, though you noted some discomfort.  -ANEURYSM OF THE TUBULAR ASCENDING THORACIC AORTA: An aneurysm is an abnormal bulge in the wall of a blood vessel. Your cardiologist will continue to monitor your 4 cm aneurysm in the thoracic aorta.  -PROSTATE CANCER: Your prostate cancer is being managed with abiraterone  and prednisone .  INSTRUCTIONS:  Please schedule a follow-up appointment after your pulmonary function tests are completed. Bring all your current medications, including over-the-counter and prescribed, to your next visit for review.

## 2023-08-18 NOTE — Assessment & Plan Note (Signed)
 Prostate cancer Managed with abiraterone  and prednisone .

## 2023-08-18 NOTE — Assessment & Plan Note (Signed)
 Chronic cough Chronic cough for eight months, possibly related to GERD. CT scan showed dependent atelectasis and ground glass opacities. No eosinophilia in blood work, reducing likelihood of asthma or allergy-related cough. - Increase omeprazole dosage temporarily for a few weeks to assess impact on cough. - Perform pulmonary function tests to evaluate for tobacco-related changes. - Discontinue Breztri  and continue using albuterol  or nebulizer as needed. - Review all current medications, including over-the-counter and prescribed, at next visit. - Schedule follow-up appointment after pulmonary function tests are completed.   Follow-up Follow-up plans discussed to reassess chronic cough and review medication regimen. - Schedule follow-up appointment after pulmonary function tests are completed. - Bring all current medications, including over-the-counter and prescribed, to next visit for review.

## 2023-08-18 NOTE — Assessment & Plan Note (Signed)
 Aneurysm of the tubular ascending thoracic aorta Aneurysm measuring 4 cm on recent CT scan. Cardiologist to monitor.

## 2023-08-18 NOTE — Progress Notes (Signed)
 Subjective:    [name], male    DOB: 09-Feb-1941, 83 y.o.   MRN: [id number]  HPI Joel [name] is an 83 year old male with coronary artery disease and obstructive sleep apnea who presents with chronic cough.  He has experienced a chronic cough for the past eight months, occurring at any time of the day without specific triggers. The cough is sometimes productive of white sputum, but not consistently. No significant nasal or sinus drainage is reported, and he does not experience breathing difficulties associated with the cough. He occasionally uses a DuoNeb nebulizer, but it is not significantly helpful.  He has a history of cough with ACE inhibitors, but he is no longer on these medications. He has a history of gastroesophageal reflux disease (GERD) but rarely experiences heartburn, which could contribute to the cough. He is currently taking omeprazole 20 mg daily for GERD.  He also started an over-the-counter medication, question an antihistamine on the recommendation of his pharmacist.  Again he does not know the name.  His past medical history includes coronary artery disease, diabetes, prior deep vein thrombosis (DVT), GERD, alpha thalassemia trait, paroxysmal atrial fibrillation, and peripheral vascular disease. He has obstructive sleep apnea managed with BiPAP. He is a former smoker with a 15 pack-year history, having quit many years ago.  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  His current medications include prednisone  5 mg daily, omeprazole 20 mg daily, and gabapentin  for headaches. He occasionally uses Advair and has an albuterol  inhaler for emergency use. It sounds like he was recently given another  inhaler -maybe this was Breztri  although he is unsure of the name   Blood work: no eosinophils (02/06/2023)  RADIOLOGY CT scan of the chest and aorta: cardiomegaly, pacemaker leads in place, no mediastinal or hilar adenopathy, aneurysm of the tubular ascending thoracic aorta at 4 cm, dependent atelectasis bilaterally with associated ground glass opacities, no overt bronchiectasis (08/02/2023) Chest x-ray: clear lung fields, basilar haziness bilaterally (04/2023)  DIAGNOSTIC TEE: LVEF 60-65%, normal function, normal RV size and function, normal mitral and aortic valves, mild dilatation of the aortic root at 4.1 cm (09/02/2022) BiPAP titration study: available (12/13/2021)   Review of Systems As per HPi  Past Medical History:  Diagnosis Date   Alpha thalassemia trait    Atypical chest pain    CAD (coronary artery disease)    a. Multiple prior evaluations then 05/2012 s/p BMS to OM2. b. Nuc 11/2012 wnl. c. Cath 2015: patent stent, moderate LAD/RCA dz, treated medically.   Cataract    CHEST PAIN UNSPECIFIED 09/13/2008   Qualifier: Diagnosis of  By: Lauretta Ponto, RN, Heather     CKD (chronic kidney disease), stage II    COPD (chronic obstructive pulmonary disease) (HCC)    Cough secondary to angiotensin converting enzyme inhibitor (ACE-I)    Diabetes mellitus (HCC) 09/07/2019   Diabetes mellitus without complication (HCC)    Diverticulosis    DVT (deep venous thrombosis) (HCC)    Family history of breast cancer in first degree relative    GERD (gastroesophageal reflux disease)    Headache(784.0)    Hx: of   History of blood transfusion    "I've had 2; don't know what it was related to" (11/09/2012)   HLD (  hyperlipidemia)    HTN (hypertension)    Iron deficiency anemia    Lymphoproliferative disorder, low grade B cell (HCC) 05/21/2020   Microcytic anemia    PAF (paroxysmal atrial fibrillation) (HCC)    Peripheral vascular disease (HCC)    a. L pop-tibial bypass 2011, followed by [facility].    Personal history of prostate cancer    Presence of permanent cardiac pacemaker    Rotator cuff injury    "left arm; never repaired" (11/09/2012)     Family History  Problem Relation Age of Onset   Breast cancer Mother        dx 50s   Hyperlipidemia Mother    Hypertension Mother    COPD Father        deceased   Hyperlipidemia Father    Hypertension Father    Hypertension Sister    Cancer Brother 60       Unknown cancer, possibly stomach   Hypertension Brother    Cancer Brother 33       Unknown, possibly lung   Hypertension Daughter    Hypertension Son    Colon cancer Neg Hx        pt is unclear about family hx     Social History   Socioeconomic History   Marital status: Widowed    Spouse name: Not on file   Number of children: Not on file   Years of education: Not on file   Highest education level: 12th grade  Occupational History   Occupation: Retired  Tobacco Use   Smoking status: Former    Current packs/day: 0.00    Average packs/day: 0.5 packs/day for 52.0 years (26.0 ttl pk-yrs)    Types: Cigarettes    Start date: 05/26/1959    Quit date: 05/26/2011    Years since quitting: 12.2    Passive exposure: Past   Smokeless tobacco: Never  Vaping Use   Vaping status: Never Used  Substance and Sexual Activity   Alcohol use: No   Drug use: No   Sexual activity: Not on file  Other Topics Concern   Not on file  Social History Narrative   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  Lives in [place] by himself.      Patient writes with his right hand, though uses his left hand for every thing else. He lives alone in a one level home. He drinks 3 cups of coffee a day and 3 7-8 oz sodas a day. He does not exercise.   Social Drivers of Corporate investment banker Strain: Low Risk  (08/15/2023)   Received from [facility]   Overall Financial Resource Strain (CARDIA)    Difficulty of Paying Living Expenses: Not hard at all  Food Insecurity: No Food Insecurity  (08/15/2023)   Received from [facility]   Hunger Vital Sign    Worried About Running Out of Food in the Last Year: Never true    Ran Out of Food in the Last Year: Never true  Transportation Needs: No Transportation Needs (08/15/2023)   Received from [facility] - Transportation    Lack of Transportation (Medical): No    Lack of Transportation (Non-Medical): No  Physical Activity: Inactive (06/01/2022)   Exercise Vital Sign    Days of Exercise per Week: 0 days    Minutes of Exercise per Session: 0 min  Stress: No Stress Concern Present (06/01/2022)   [employer] of Occupational Health - Occupational Stress Questionnaire  Feeling of Stress : Not at all  Social Connections: Unknown (08/21/2021)   [facility] from [facility]   Social Network    Social Network: Not on file  Intimate Partner Violence: Unknown (07/13/2021)   [facility] from [facility]   HITS    Physically Hurt: Not on file    Insult or Talk Down To: Not on file    Threaten Physical Harm: Not on file    Scream or Curse: Not on file     Allergies  Allergen Reactions   Topiramate Shortness Of Breath and Other (See Comments)     leg cramps   Atenolol  Other (See Comments)    bradycardia   Lisinopril Cough   Tramadol -Acetaminophen  Cough    Tolerates plain Tramadol      Outpatient Medications Prior to Visit  Medication Sig Dispense Refill   abiraterone  acetate (ZYTIGA ) 250 MG tablet Take 1,000 mg by mouth daily. On an empty stomach     Accu-Chek FastClix Lancets MISC Test once daily 102 each 12   amLODipine  (NORVASC ) 10 MG tablet TAKE 1 TABLET EVERY DAY 90 tablet 3   aspirin  EC 81 MG tablet Take 1 tablet (81 mg total) by mouth daily. Swallow whole. 90 tablet 3   gabapentin  (NEURONTIN ) 600 MG tablet Take 600 mg by mouth 2 (two) times daily.     ipratropium-albuterol  (DUONEB) 0.5-2.5 (3) MG/3ML SOLN Take 3 mLs by nebulization every 6 (six) hours as needed. 360 mL 1    isosorbide  mononitrate (IMDUR ) 120 MG 24 hr tablet Take 1 tablet (120 mg total) by mouth daily. 90 tablet 3   omeprazole (PRILOSEC) 20 MG capsule Take 20 mg by mouth daily.     pravastatin  (PRAVACHOL ) 40 MG tablet TAKE 1 TABLET EVERY EVENING 90 tablet 3   predniSONE  (DELTASONE ) 5 MG tablet Take 5 mg by mouth in the morning.     tamsulosin  (FLOMAX ) 0.4 MG CAPS capsule Take 0.4 mg by mouth daily.     albuterol  (VENTOLIN  HFA) 108 (90 Base) MCG/ACT inhaler Inhale 2 puffs into the lungs every 6 (six) hours as needed for wheezing or shortness of breath. (Patient not taking: Reported on 08/18/2023) 24 g 0   brimonidine  (ALPHAGAN ) 0.2 % ophthalmic solution Place 1 drop into both eyes 2 (two) times daily. (Patient not taking: Reported on 08/18/2023)     diclofenac  Sodium (VOLTAREN ) 1 % GEL Apply 2 g topically 2 (two) times daily as needed (back pain). (Patient not taking: Reported on 08/18/2023)     ferrous gluconate  (FERGON) 324 MG tablet Take 1 tablet (324 mg total) by mouth daily with breakfast. (Patient not taking: Reported on 08/18/2023) 30 tablet 2   fluticasone -salmeterol (ADVAIR HFA) 115-21 MCG/ACT inhaler Inhale 2 puffs into the lungs 2 (two) times daily. (Patient not taking: Reported on 08/18/2023) 1 each 12   latanoprost  (XALATAN ) 0.005 % ophthalmic solution Place 1 drop into both eyes at bedtime. (Patient not taking: Reported on 08/18/2023)     nitroGLYCERIN  (NITROSTAT ) 0.4 MG SL tablet Place 1 tablet (0.4 mg total) under the tongue every 5 (five) minutes as needed for chest pain (max 3 doses). (Patient not taking: Reported on 08/18/2023) 75 tablet 2   polyethylene glycol powder (GLYCOLAX /MIRALAX ) 17 GM/SCOOP powder Take 17 g by mouth daily. (Patient not taking: Reported on 08/18/2023) 765 g 2   traMADol  (ULTRAM ) 50 MG tablet TAKE 1 TABLET(50 MG) BY MOUTH EVERY 12 HOURS AS NEEDED (Patient not taking: Reported on 08/18/2023) 30 tablet  0   No facility-administered medications prior to visit.        Objective:    Physical Exam Vitals:   08/18/23 0925  BP: 115/61  Pulse: 61  SpO2: 97%  Weight: 228 lb 12.8 oz (103.8 kg)  Height: 5\' 9"  (1.753 m)   Gen: Pleasant, well-nourished, in no distress,  normal affect  ENT: No lesions,  mouth clear,  oropharynx clear, no postnasal drip  Neck: No JVD, no stridor  Lungs: No use of accessory muscles, no crackles or wheezing on normal respiration, no wheeze on forced expiration  Cardiovascular: RRR, heart sounds normal, no murmur or gallops, no peripheral edema  Musculoskeletal: No deformities, no cyanosis or clubbing  Neuro: alert, awake, non focal  Skin: Warm, no lesions or rash      Assessment & Plan:   Chronic cough Chronic cough Chronic cough for eight months, possibly related to GERD. CT scan showed dependent atelectasis and ground glass opacities. No eosinophilia in blood work, reducing likelihood of asthma or allergy-related cough. - Increase omeprazole dosage temporarily for a few weeks to assess impact on cough. - Perform pulmonary function tests to evaluate for tobacco-related changes. - Discontinue Breztri  and continue using albuterol  or nebulizer as needed. - Review all current medications, including over-the-counter and prescribed, at next visit. - Schedule follow-up appointment after pulmonary function tests are completed.   Follow-up Follow-up plans discussed to reassess chronic cough and review medication regimen. - Schedule follow-up appointment after pulmonary function tests are completed. - Bring all current medications, including over-the-counter and prescribed, to next visit for review.  Obesity hypoventilation syndrome (HCC) On BiPAP and tolerates okay.  Some discomfort.  Managed at cardiology  GERD Will temporarily increase his omeprazole to 20 mg twice daily  COPD (chronic obstructive pulmonary disease) (HCC) He has been trialed on scheduled BD therapy plus ICS, unclear how much COPD he has.  I will hold the Breztri   and/or Advair for now and get formal PFT to quantify his degree of obstruction.  Aortic aneurysm (HCC) Aneurysm of the tubular ascending thoracic aorta Aneurysm measuring 4 cm on recent CT scan. Cardiologist to monitor.  Prostate cancer [facility]) Prostate cancer Managed with abiraterone  and prednisone .   Racheal Buddle, MD, PhD 08/18/2023, 10:07 AM [facility] [phone] or if no answer before 7:00PM call [phone] For any issues after 7:00PM please call eLink [phone]

## 2023-08-18 NOTE — Assessment & Plan Note (Signed)
 Will temporarily increase his omeprazole to 20 mg twice daily

## 2023-08-24 ENCOUNTER — Telehealth: Payer: Self-pay | Admitting: Internal Medicine

## 2023-08-24 ENCOUNTER — Other Ambulatory Visit: Payer: Self-pay | Admitting: Medical

## 2023-08-24 DIAGNOSIS — M545 Low back pain, unspecified: Secondary | ICD-10-CM

## 2023-08-24 NOTE — Telephone Encounter (Unsigned)
 Copied from CRM [id number]. Topic: Clinical - Medical Advice >> Aug 24, 2023  8:23 AM [name] wrote: Reason for CRM Patient would like to ask a question about a medicine refill that he states "Joel [name] knows about". Would like a call back from [place] to talk quickly with her. >> Aug 24, 2023  9:56 AM Hamp Levine R wrote: Patient called again stating he needs to speak with [place]. Advised a message was sent and as soon as she has time in her schedule she will call him back.

## 2023-08-25 ENCOUNTER — Emergency Department (HOSPITAL_COMMUNITY)

## 2023-08-25 ENCOUNTER — Emergency Department (HOSPITAL_COMMUNITY)
Admission: EM | Admit: 2023-08-25 | Discharge: 2023-08-25 | Disposition: A | Discharge status: Home / Self Care | Attending: Emergency Medicine | Admitting: Emergency Medicine

## 2023-08-25 ENCOUNTER — Other Ambulatory Visit: Payer: Self-pay

## 2023-08-25 DIAGNOSIS — Z950 Presence of cardiac pacemaker: Secondary | ICD-10-CM | POA: Insufficient documentation

## 2023-08-25 DIAGNOSIS — Z79899 Other long term (current) drug therapy: Secondary | ICD-10-CM | POA: Insufficient documentation

## 2023-08-25 DIAGNOSIS — I2510 Atherosclerotic heart disease of native coronary artery without angina pectoris: Secondary | ICD-10-CM | POA: Diagnosis not present

## 2023-08-25 DIAGNOSIS — N182 Chronic kidney disease, stage 2 (mild): Secondary | ICD-10-CM | POA: Diagnosis not present

## 2023-08-25 DIAGNOSIS — M545 Low back pain, unspecified: Secondary | ICD-10-CM

## 2023-08-25 DIAGNOSIS — E1122 Type 2 diabetes mellitus with diabetic chronic kidney disease: Secondary | ICD-10-CM | POA: Insufficient documentation

## 2023-08-25 DIAGNOSIS — R079 Chest pain, unspecified: Secondary | ICD-10-CM | POA: Diagnosis not present

## 2023-08-25 DIAGNOSIS — J449 Chronic obstructive pulmonary disease, unspecified: Secondary | ICD-10-CM | POA: Insufficient documentation

## 2023-08-25 DIAGNOSIS — Z8546 Personal history of malignant neoplasm of prostate: Secondary | ICD-10-CM | POA: Insufficient documentation

## 2023-08-25 DIAGNOSIS — E7800 Pure hypercholesterolemia, unspecified: Secondary | ICD-10-CM

## 2023-08-25 DIAGNOSIS — R0789 Other chest pain: Secondary | ICD-10-CM | POA: Diagnosis not present

## 2023-08-25 DIAGNOSIS — Z7952 Long term (current) use of systemic steroids: Secondary | ICD-10-CM | POA: Insufficient documentation

## 2023-08-25 DIAGNOSIS — I130 Hypertensive heart and chronic kidney disease with heart failure and stage 1 through stage 4 chronic kidney disease, or unspecified chronic kidney disease: Secondary | ICD-10-CM | POA: Diagnosis not present

## 2023-08-25 DIAGNOSIS — Z7982 Long term (current) use of aspirin: Secondary | ICD-10-CM | POA: Diagnosis not present

## 2023-08-25 DIAGNOSIS — E119 Type 2 diabetes mellitus without complications: Secondary | ICD-10-CM | POA: Insufficient documentation

## 2023-08-25 DIAGNOSIS — I10 Essential (primary) hypertension: Secondary | ICD-10-CM

## 2023-08-25 LAB — BASIC METABOLIC PANEL WITH GFR
Anion gap: 10 (ref 5–15)
BUN: 12 mg/dL (ref 8–23)
CO2: 22 mmol/L (ref 22–32)
Calcium: 9 mg/dL (ref 8.9–10.3)
Chloride: 106 mmol/L (ref 98–111)
Creatinine, Ser: 0.9 mg/dL (ref 0.61–1.24)
GFR, Estimated: >60 mL/min (ref >60)
Glucose, Bld: 111 mg/dL — ABNORMAL HIGH (ref 70–99)
Potassium: 3.9 mmol/L (ref 3.5–5.1)
Sodium: 138 mmol/L (ref 135–145)

## 2023-08-25 LAB — HEPATIC FUNCTION PANEL
ALT: 13 U/L (ref 0–44)
AST: 16 U/L (ref 15–41)
Albumin: 3.4 g/dL — ABNORMAL LOW (ref 3.5–5.0)
Alkaline Phosphatase: 62 U/L (ref 38–126)
Bilirubin, Direct: <0.1 mg/dL (ref 0.0–0.2)
Total Bilirubin: 0.4 mg/dL (ref 0.0–1.2)
Total Protein: 6.9 g/dL (ref 6.5–8.1)

## 2023-08-25 LAB — CBC
HCT: 34.4 % — ABNORMAL LOW (ref 39.0–52.0)
Hemoglobin: 10.2 g/dL — ABNORMAL LOW (ref 13.0–17.0)
MCH: 21 pg — ABNORMAL LOW (ref 26.0–34.0)
MCHC: 29.7 g/dL — ABNORMAL LOW (ref 30.0–36.0)
MCV: 70.8 fL — ABNORMAL LOW (ref 80.0–100.0)
Platelets: 186 10*3/uL (ref 150–400)
RBC: 4.86 MIL/uL (ref 4.22–5.81)
RDW: 18.3 % — ABNORMAL HIGH (ref 11.5–15.5)
WBC: 5.6 10*3/uL (ref 4.0–10.5)
nRBC: 0 % (ref 0.0–0.2)

## 2023-08-25 LAB — LIPASE, BLOOD: Lipase: 43 U/L (ref 11–51)

## 2023-08-25 LAB — MAGNESIUM: Magnesium: 1.9 mg/dL (ref 1.7–2.4)

## 2023-08-25 LAB — TROPONIN I (HIGH SENSITIVITY)
Troponin I (High Sensitivity): 9 ng/L (ref <18)
Troponin I (High Sensitivity): 9 ng/L (ref <18)

## 2023-08-25 LAB — BRAIN NATRIURETIC PEPTIDE: B Natriuretic Peptide: 20.3 pg/mL (ref 0.0–100.0)

## 2023-08-25 LAB — TSH: TSH: 2.754 u[IU]/mL (ref 0.350–4.500)

## 2023-08-25 MED ORDER — TRAMADOL HCL 50 MG PO TABS: 50.0000 mg | ORAL_TABLET | Freq: Two times a day (BID) | ORAL | 0 refills | Status: DC | PRN

## 2023-08-25 NOTE — Telephone Encounter (Signed)
 Pt calling for refill on tramadol , and wanted to know why it was denied. Advised pt that we have not prescribed since March 2024 and he would need appt. He states he takes those when he "gets one of those pains" Offered appt this morning with Jimmye Moulds, he declined appt stating he is already at ER because he had a pain and didn't have anything to take

## 2023-08-25 NOTE — Discharge Instructions (Signed)
 Your history, exam, workup today led us  to call cardiology to come see you and they feel you are safe for discharge home.  Please follow-up with them in clinic.  Please rest and stay hydrated.  If any symptoms change or worsen acutely, please turn to the nearest emergency department.

## 2023-08-25 NOTE — ED Notes (Signed)
 Patient arriving to room from XR

## 2023-08-25 NOTE — ED Notes (Signed)
 Lab called, new orders can be added on.

## 2023-08-25 NOTE — ED Notes (Signed)
Patient ambulated to bathroom with cane.

## 2023-08-25 NOTE — ED Notes (Signed)
Walked patient to the bathroom patient did well 

## 2023-08-25 NOTE — ED Notes (Signed)
 Per Larinda Plover from [facility], no arrythmias recently and currently in NSR. Atrial tachycardias in April. MD Tegeler notifed.

## 2023-08-25 NOTE — ED Provider Notes (Signed)
 [facility] AT [facility] Provider Note   CSN: [id number] Arrival date & time: 08/25/23  8469     History  Chief Complaint  Patient presents with   Chest Pain    Joel [name] is a 83 y.o. male.  The history is provided by the patient and medical records. No language interpreter was used.  Chest Pain Pain location:  L chest Pain quality: aching and crushing   Pain radiates to:  Does not radiate Pain severity:  Severe Onset quality:  Gradual Duration:  2 days Timing:  Intermittent Progression:  Waxing and waning Chronicity:  New Context: not trauma   Relieved by:  Nothing Worsened by:  Nothing Ineffective treatments:  None tried Associated symptoms: cough (chronic and unchanged)   Associated symptoms: no abdominal pain, no altered mental status, no back pain, no diaphoresis, no fatigue, no fever, no headache, no heartburn, no lower extremity edema, no nausea, no near-syncope, no numbness, no palpitations, no shortness of breath, no vomiting and no weakness   Risk factors: coronary artery disease, diabetes mellitus, hypertension, male sex and prior DVT/PE        Home Medications Prior to Admission medications   Medication Sig Start Date End Date Taking? Authorizing Provider  abiraterone  acetate (ZYTIGA ) 250 MG tablet Take 1,000 mg by mouth daily. On an empty stomach 02/17/18   [provider]  Accu-Chek FastClix Lancets MISC Test once daily 11/23/18   Henson, Vickie L, NP-C  albuterol  (VENTOLIN  HFA) 108 (90 Base) MCG/ACT inhaler Inhale 2 puffs into the lungs every 6 (six) hours as needed for wheezing or shortness of breath. Patient not taking: Reported on 08/18/2023 05/21/23   Claudene Crystal, PA-C  amLODipine  (NORVASC ) 10 MG tablet TAKE 1 TABLET EVERY DAY 11/10/22   Odie Benne, MD  aspirin  EC 81 MG tablet Take 1 tablet (81 mg total) by mouth daily. Swallow whole. 03/17/22   Odie Benne, MD  brimonidine   (ALPHAGAN ) 0.2 % ophthalmic solution Place 1 drop into both eyes 2 (two) times daily. Patient not taking: Reported on 08/18/2023 05/09/20   [provider]  diclofenac  Sodium (VOLTAREN ) 1 % GEL Apply 2 g topically 2 (two) times daily as needed (back pain). Patient not taking: Reported on 08/18/2023 06/16/20   [provider]  ferrous gluconate  (FERGON) 324 MG tablet Take 1 tablet (324 mg total) by mouth daily with breakfast. Patient not taking: Reported on 08/18/2023 05/13/23   Tysinger, Christiane Cowing, PA-C  fluticasone -salmeterol (ADVAIR HFA) 115-21 MCG/ACT inhaler Inhale 2 puffs into the lungs 2 (two) times daily. Patient not taking: Reported on 08/18/2023 05/20/23   Tysinger, Christiane Cowing, PA-C  gabapentin  (NEURONTIN ) 600 MG tablet Take 600 mg by mouth 2 (two) times daily.    [provider]  ipratropium-albuterol  (DUONEB) 0.5-2.5 (3) MG/3ML SOLN Take 3 mLs by nebulization every 6 (six) hours as needed. 05/12/23   Tysinger, Christiane Cowing, PA-C  isosorbide  mononitrate (IMDUR ) 120 MG 24 hr tablet Take 1 tablet (120 mg total) by mouth daily. 02/21/23   Odie Benne, MD  latanoprost  (XALATAN ) 0.005 % ophthalmic solution Place 1 drop into both eyes at bedtime. Patient not taking: Reported on 08/18/2023 02/28/20   [provider]  nitroGLYCERIN  (NITROSTAT ) 0.4 MG SL tablet Place 1 tablet (0.4 mg total) under the tongue every 5 (five) minutes as needed for chest pain (max 3 doses). Patient not taking: Reported on 08/18/2023 02/19/22   Odie Benne, MD  omeprazole (  PRILOSEC) 20 MG capsule Take 20 mg by mouth daily.    [provider]  polyethylene glycol powder (GLYCOLAX /MIRALAX ) 17 GM/SCOOP powder Take 17 g by mouth daily. Patient not taking: Reported on 08/18/2023 05/13/23   Claudene Crystal, PA-C  pravastatin  (PRAVACHOL ) 40 MG tablet TAKE 1 TABLET EVERY EVENING 11/10/22   Odie Benne, MD  predniSONE  (DELTASONE ) 5 MG tablet Take 5 mg by mouth in the morning.  07/17/20   [provider]  tamsulosin  (FLOMAX ) 0.4 MG CAPS capsule Take 0.4 mg by mouth daily.    [provider]  traMADol  (ULTRAM ) 50 MG tablet TAKE 1 TABLET(50 MG) BY MOUTH EVERY 12 HOURS AS NEEDED Patient not taking: Reported on 08/18/2023 06/25/22   Tysinger, Christiane Cowing, PA-C      Allergies    Topiramate, Atenolol , Lisinopril, and Tramadol -acetaminophen     Review of Systems   Review of Systems  Constitutional:  Negative for chills, diaphoresis, fatigue and fever.  HENT:  Negative for congestion.   Respiratory:  Positive for cough (chronic and unchanged). Negative for chest tightness and shortness of breath.   Cardiovascular:  Positive for chest pain. Negative for palpitations and near-syncope.  Gastrointestinal:  Positive for diarrhea (chronic). Negative for abdominal pain, constipation, heartburn, nausea and vomiting.  Genitourinary:  Negative for dysuria, flank pain and frequency.  Musculoskeletal:  Negative for back pain, neck pain and neck stiffness.  Skin:  Negative for rash and wound.  Neurological:  Negative for syncope, weakness, numbness and headaches.  Psychiatric/Behavioral:  Negative for agitation and confusion.   All other systems reviewed and are negative.   Physical Exam Updated Vital Signs BP (!) 159/80 (BP Location: Right Arm)   Pulse (!) 59   Temp (!) 97.4 F (36.3 C)   Resp 16   SpO2 99%  Physical Exam Vitals and nursing note reviewed.  Constitutional:      General: He is not in acute distress.    Appearance: He is well-developed. He is not ill-appearing, toxic-appearing or diaphoretic.  HENT:     Head: Normocephalic and atraumatic.  Eyes:     Conjunctiva/sclera: Conjunctivae normal.     Pupils: Pupils are equal, round, and reactive to light.  Cardiovascular:     Rate and Rhythm: Normal rate and regular rhythm.     Heart sounds: Normal heart sounds. No murmur heard. Pulmonary:     Effort: Pulmonary effort is normal. No tachypnea or  respiratory distress.     Breath sounds: Normal breath sounds. No wheezing, rhonchi or rales.  Chest:     Chest wall: No tenderness.  Abdominal:     Palpations: Abdomen is soft.     Tenderness: There is no abdominal tenderness.  Musculoskeletal:        General: No swelling.     Cervical back: Neck supple.     Right lower leg: No tenderness. No edema.     Left lower leg: No tenderness. No edema.  Skin:    General: Skin is warm and dry.     Capillary Refill: Capillary refill takes less than 2 seconds.     Findings: No erythema.  Neurological:     General: No focal deficit present.     Mental Status: He is alert.  Psychiatric:        Mood and Affect: Mood normal.     ED Results / Procedures / Treatments   Labs (all labs ordered are listed, but only abnormal results are displayed) Labs Reviewed  BASIC  METABOLIC PANEL WITH GFR - Abnormal; Notable for the following components:      Result Value   Glucose, Bld 111 (*)    All other components within normal limits  CBC - Abnormal; Notable for the following components:   Hemoglobin 10.2 (*)    HCT 34.4 (*)    MCV 70.8 (*)    MCH 21.0 (*)    MCHC 29.7 (*)    RDW 18.3 (*)    All other components within normal limits  HEPATIC FUNCTION PANEL - Abnormal; Notable for the following components:   Albumin 3.4 (*)    All other components within normal limits  LIPASE, BLOOD  BRAIN NATRIURETIC PEPTIDE  MAGNESIUM  TSH  TROPONIN I (HIGH SENSITIVITY)  TROPONIN I (HIGH SENSITIVITY)    EKG EKG Interpretation Date/Time:  Thursday Aug 25 2023 08:15:43 EDT Ventricular Rate:  60 PR Interval:  186 QRS Duration:  138 QT Interval:  456 QTC Calculation: 456 R Axis:   23  Text Interpretation: AV dual-paced rhythm Abnormal ECG When compared with ECG of 10-May-2023 10:00, PREVIOUS ECG IS PRESENT when compared to prior, similar paced rhythm No STEMI Confirmed by Wynell Heath ([id number]) on 08/25/2023 8:27:43 AM  Radiology DG Chest 2  View Result Date: 08/25/2023 CLINICAL DATA:  Chest pain EXAM: CHEST - 2 VIEW COMPARISON:  May 10, 2023 FINDINGS: The heart size and mediastinal contours are within normal limits. Both lungs are clear. The visualized skeletal structures are unremarkable. No change in the left subclavian bipolar ICDF pacemaker device tip of the leads in good position no evidence of discontinuity IMPRESSION: No active cardiopulmonary disease. Electronically Signed   By: Fredrich Jefferson M.D.   On: 08/25/2023 08:38    Procedures Procedures    Medications Ordered in ED Medications - No data to display  ED Course/ Medical Decision Making/ A&P                                 Medical Decision Making Amount and/or Complexity of Data Reviewed Labs: ordered. Radiology: ordered.    Joel [name] is a 83 y.o. male with a past medical history significant for CAD with PCI, COPD, previous prostate cancer, hyperlipidemia, hypertension, previous lymphoma, diabetes, pacemaker dependence, ascending aortic aneurysm, and previous paroxysmal atrial fibrillation who presents with recurrent chest discomfort.  According to patient, he started having pain yesterday that feels like discomfort he has had that was cardiac in nature in the past.  He reports that it is in his left chest and is a squeezing gripping tightness pain that comes and goes.  It lasts less than a minute an episode but has been happening on and off since yesterday.  He reports no thumping or palpitations and it does not feel like a stabbing pain through to his back.  He does not radiate.  He denies associated diaphoresis, nausea, vomiting, or shortness of breath.  Reports a chronic cough that is no different.  He denies any fevers or chills and denies any constipation, urinary changes or new edema.  He does report some chronic diarrhea that is unchanged.  Denies trauma.  Reports the pain gets up to 10 out of 10 in severity at times.  On exam, lungs clear.  Chest  nontender.  I cannot reproduce discomfort.  Abdomen nontender.  No murmur.  He has intact pulses in extremities.  Legs do not have significant edema.  Patient is resting comfortably.  EKG appears paced similar to prior.  Given the patient's cardiac history and his report this feels similar to cardiac pain he has had in the past, we will get screening labs and x-ray.  We will try to have his pacemaker interrogated.  He is currently chest pain-free.  Anticipate discussion with cardiology about management given his history and his description of symptoms today.  Cardiology saw patient and feel he is safe to discharge home.  Will prepare discharge for him and he can follow-up with cardiology in outpatient for further management.  He had no other concerns and was discharged in good condition.        Final Clinical Impression(s) / ED Diagnoses Final diagnoses:  Chest pain, unspecified type    Rx / DC Orders ED Discharge Orders     None       Clinical Impression: 1. Chest pain, unspecified type     Disposition: Discharge  Condition: Good  I have discussed the results, Dx and Tx plan with the pt(& family if present). He/she/they expressed understanding and agree(s) with the plan. Discharge instructions discussed at great length. Strict return precautions discussed and pt &/or family have verbalized understanding of the instructions. No further questions at time of discharge.    New Prescriptions   No medications on file    Follow Up: Maudine Sos, MD [address] [place] [address] [phone]         Quantae Martel, Marine Sia, MD 08/25/23 [phone]

## 2023-08-25 NOTE — Telephone Encounter (Signed)
Called and left message for pt to call me back.

## 2023-08-25 NOTE — ED Triage Notes (Signed)
 Pt c/o L sided non-radiating CP that started yesterday while at rest. Pt denies associated SOB, N/V, diaphoresis. Pt reports taking 2-81 mg ASA early this morning.

## 2023-08-25 NOTE — Telephone Encounter (Signed)
 Pt being seen tomorrow

## 2023-08-25 NOTE — Consult Note (Signed)
 Cardiology Consultation   [name] MRN: [id number]; DOB: 1941-02-09  Admit date: 08/25/2023 Date of Consult: 08/25/2023  PCP:  Claudene Crystal, PA-C   [facility] Providers Cardiologist:  Antoinette Batman, MD  Electrophysiologist:  Boyce Byes, MD  {    Patient Profile:   Joel [name] is a 83 y.o. 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  History of Present Illness:   Joel [name] has past medical history as stated above. He presented to [name] on 08/25/2023 complaining of left-sided chest pain that he described as aching and crushing. He denies any radiation of his chest pain. Reported that the chest pain has been going on since yesterday. Denies any abdominal pain, diaphoresis, fatigue, nausea, palpitations, shortness of breath, weakness.   Relevant workup in the ED includes: EKG showed AV dual-paced rhythm, HR 60s without acute ischemic changes, CBC stable, troponin negative x 2, BMP normal, TSH normal, BNP normal, CXR showed no active disease.   [employer] device was interrogated and revealed no recent arrhythmias, NSR, some atrial tachycardia reported in April per device rep.   His cardiac history includes: Cardiac cath in 05/2012 that showed severe stenosis of OM2 that was treated with bare metal stent. He experienced chronic angina since then and has gone to the ED and seen in office multiple times regarding chest pain.  Nuclear stress test in 02/2017 that showed EF 55% with moderate inferior defect that could be consistent with ischemic and possible soft tissue attenuation.  He presented to the ED or urgent care 07/2017, 01/2018, 07/2018, 08/2018,  09/2018, 01/2020, 04/2020, 04/2021, 06/2021, 07/2021, 09/2021, 02/2022, 06/2022, for chest pain.  Cardiac cath 07/2019 that showed moderate diffuse CAD without a focal lesion except for PDA which was too small to stent.   Nuclear stress test in 04/2021 that showed no ischemia.  Diagnosed with complete heart block in 07/2021 and had pacemaker placed 08/2021.  Pacemaker showed episodes of A. Fib, patient was then placed on anticoagulation, developed GI bleeds that resolved with cessation of anticoagulation.  Attempted Watchman placement 08/2022 that was unsuccessful due to poor vascular access.  Cardiac CT 05/2022, prior to undergoing Watchman attempt, that showed 4.7 cm ascending aortic aneurysm.  TEE in 08/2022 showed LVEF 60-65% without valve disease.  CTA chest 07/2023 showed 4.0 cm ascending aortic aneurysm  Patient was most recently seen by [name] as an outpatient 07/21/2023 for follow up in regards to his CAD. At this visit, he denied any chest pain, dyspnea, palpitations, LE edema, orthopnea, dizziness, syncope or near syncope. His medication plan included: ASA 81 mg daily, amlodipine  10 mg daily, pravastatin  40 mg daily, Imdur  120 mg daily. In regards to his PAF he was found to be in NSR and did not wish to re-start Eliquis  after failing Watchman placement.   After speaking with the patient, he agrees with the history as above. He says the chest pain that he experienced only lasts about a minute. He reported no associated symptoms. He says that he normally takes tramadol  for his chest pain and that is what helps him and he recently ran out of tramadol  and  just needs another prescription. He said he tried to get it refilled but was denied because he had not been seen in over a year from the original prescribing provider and they told him he had to come in for an appointment. He denies any active chest pain. The chest pain was not made better or worse by anything and was nonexertional.   Past Medical  History:  Diagnosis Date   Alpha thalassemia trait    Atypical chest pain    CAD (coronary artery disease)    a. Multiple prior evaluations then 05/2012 s/p BMS to OM2. b. Nuc 11/2012 wnl. c. Cath 2015: patent stent, moderate LAD/RCA dz, treated medically.   Cataract    CHEST PAIN UNSPECIFIED 09/13/2008   Qualifier: Diagnosis of  By: Lauretta Ponto, RN, Heather     CKD (chronic kidney disease), stage II    COPD (chronic obstructive pulmonary disease) (HCC)    Cough secondary to angiotensin converting enzyme inhibitor (ACE-I)    Diabetes mellitus (HCC) 09/07/2019   Diabetes mellitus without complication (HCC)    Diverticulosis    DVT (deep venous thrombosis) (HCC)    Family history of breast cancer in first degree relative    GERD (gastroesophageal reflux disease)    Headache(784.0)    Hx: of   History of blood transfusion    "I've had 2; don't know what it was related to" (11/09/2012)   HLD (hyperlipidemia)    HTN (hypertension)    Iron deficiency anemia    Lymphoproliferative disorder, low grade B cell (HCC) 05/21/2020   Microcytic anemia    PAF (paroxysmal atrial fibrillation) (HCC)    Peripheral vascular disease (HCC)    a. L pop-tibial bypass 2011, followed by [facility].   Personal history of prostate cancer    Presence of permanent cardiac pacemaker    Rotator cuff injury    "left arm; never repaired" (11/09/2012)   Past Surgical History:  Procedure Laterality Date   CARDIOVASCULAR STRESS TEST  Aug. 2014   CATARACT EXTRACTION W/ INTRAOCULAR LENS IMPLANT Right ~ 2008   CIRCUMCISION     COLONOSCOPY     CORONARY ANGIOPLASTY WITH STENT PLACEMENT  2014   "1" (11/09/2012)   ILIAC ARTERY ANEURYSM REPAIR     LEFT ATRIAL APPENDAGE OCCLUSION N/A 09/02/2022   Procedure: LEFT ATRIAL APPENDAGE OCCLUSION;  Surgeon: Boyce Byes, MD;  Location: [facility] INVASIVE CV LAB;  Service: Cardiovascular;  Laterality: N/A;   LEFT HEART CATH  05-31-12   LEFT HEART CATH AND CORONARY ANGIOGRAPHY N/A 08/01/2019    Procedure: LEFT HEART CATH AND CORONARY ANGIOGRAPHY;  Surgeon: Odie Benne, MD;  Location: [facility] INVASIVE CV LAB;  Service: Cardiovascular;  Laterality: N/A;   LEFT HEART CATHETERIZATION WITH CORONARY ANGIOGRAM N/A 06/01/2011   Procedure: LEFT HEART CATHETERIZATION WITH CORONARY ANGIOGRAM;  Surgeon: Kristopher Pheasant, MD;  Location: [facility] CATH LAB;  Service: Cardiovascular;  Laterality: N/A;   LEFT HEART CATHETERIZATION WITH CORONARY ANGIOGRAM N/A 05/31/2012   Procedure: LEFT HEART CATHETERIZATION WITH CORONARY ANGIOGRAM;  Surgeon: Peter M Swaziland, MD;  Location: [facility] CATH LAB;  Service: Cardiovascular;  Laterality: N/A;   LEFT HEART CATHETERIZATION WITH CORONARY ANGIOGRAM N/A 08/01/2013   Procedure: LEFT HEART CATHETERIZATION WITH CORONARY ANGIOGRAM;  Surgeon: Odie Benne, MD;  Location: [facility] CATH LAB;  Service: Cardiovascular;  Laterality: N/A;   PACEMAKER IMPLANT N/A 09/03/2021   Procedure: PACEMAKER IMPLANT;  Surgeon: Tammie Fall, MD;  Location: [facility] INVASIVE CV LAB;  Service: Cardiovascular;  Laterality: N/A;  PERCUTANEOUS CORONARY STENT INTERVENTION (PCI-S)  05/31/2012   Procedure: PERCUTANEOUS CORONARY STENT INTERVENTION (PCI-S);  Surgeon: Peter M Swaziland, MD;  Location: [facility] CATH LAB;  Service: Cardiovascular;;   PR VEIN BYPASS GRAFT,AORTO-FEM-POP Left 10/01/2009   left below knee popliteal artery to posterior tibial artery   SHOULDER ARTHROSCOPY WITH OPEN ROTATOR CUFF REPAIR AND DISTAL CLAVICLE ACROMINECTOMY Left 01/12/2013   Procedure: LEFT SHOULDER ARTHROSCOPY WITH DEBRIDEMENT OPEN DISTAL CLAVICLE RESECTION ,acromioplastyAND ROTATOR CUFF REPAIR;  Surgeon: Forbes Ida., MD;  Location: [facility] OR;  Service: Orthopedics;  Laterality: Left;   TEE WITHOUT CARDIOVERSION N/A 09/02/2022   Procedure: TRANSESOPHAGEAL ECHOCARDIOGRAM;  Surgeon: Boyce Byes, MD;  Location: [facility] INVASIVE CV LAB;  Service: Cardiovascular;  Laterality: N/A;    Home Medications:  Prior to Admission medications    Medication Sig Start Date End Date Taking? Authorizing Provider  abiraterone  acetate (ZYTIGA ) 250 MG tablet Take 1,000 mg by mouth daily. On an empty stomach 02/17/18   [provider]  Accu-Chek FastClix Lancets MISC Test once daily 11/23/18   Henson, Vickie L, NP-C  albuterol  (VENTOLIN  HFA) 108 (90 Base) MCG/ACT inhaler Inhale 2 puffs into the lungs every 6 (six) hours as needed for wheezing or shortness of breath. Patient not taking: Reported on 08/18/2023 05/21/23   Claudene Crystal, PA-C  amLODipine  (NORVASC ) 10 MG tablet TAKE 1 TABLET EVERY DAY 11/10/22   Odie Benne, MD  aspirin  EC 81 MG tablet Take 1 tablet (81 mg total) by mouth daily. Swallow whole. 03/17/22   Odie Benne, MD  brimonidine  (ALPHAGAN ) 0.2 % ophthalmic solution Place 1 drop into both eyes 2 (two) times daily. Patient not taking: Reported on 08/18/2023 05/09/20   [provider]  diclofenac  Sodium (VOLTAREN ) 1 % GEL Apply 2 g topically 2 (two) times daily as needed (back pain). Patient not taking: Reported on 08/18/2023 06/16/20   [provider]  ferrous gluconate  (FERGON) 324 MG tablet Take 1 tablet (324 mg total) by mouth daily with breakfast. Patient not taking: Reported on 08/18/2023 05/13/23   Tysinger, Christiane Cowing, PA-C  fluticasone -salmeterol (ADVAIR HFA) 115-21 MCG/ACT inhaler Inhale 2 puffs into the lungs 2 (two) times daily. Patient not taking: Reported on 08/18/2023 05/20/23   Tysinger, Christiane Cowing, PA-C  gabapentin  (NEURONTIN ) 600 MG tablet Take 600 mg by mouth 2 (two) times daily.    [provider]  ipratropium-albuterol  (DUONEB) 0.5-2.5 (3) MG/3ML SOLN Take 3 mLs by nebulization every 6 (six) hours as needed. 05/12/23   Tysinger, Christiane Cowing, PA-C  isosorbide  mononitrate (IMDUR ) 120 MG 24 hr tablet Take 1 tablet (120 mg total) by mouth daily. 02/21/23   Odie Benne, MD  latanoprost  (XALATAN ) 0.005 % ophthalmic solution Place 1 drop into both eyes at bedtime. Patient not  taking: Reported on 08/18/2023 02/28/20   [provider]  nitroGLYCERIN  (NITROSTAT ) 0.4 MG SL tablet Place 1 tablet (0.4 mg total) under the tongue every 5 (five) minutes as needed for chest pain (max 3 doses). Patient not taking: Reported on 08/18/2023 02/19/22   Odie Benne, MD  omeprazole (PRILOSEC) 20 MG capsule Take 20 mg by mouth daily.    [provider]  polyethylene glycol powder (GLYCOLAX /MIRALAX ) 17 GM/SCOOP powder Take 17 g by mouth daily. Patient not taking: Reported on 08/18/2023 05/13/23   Claudene Crystal, PA-C  pravastatin  (PRAVACHOL ) 40 MG tablet TAKE 1 TABLET EVERY EVENING 11/10/22   Odie Benne, MD  predniSONE  (DELTASONE ) 5 MG tablet Take 5  mg by mouth in the morning. 07/17/20   [provider]  tamsulosin  (FLOMAX ) 0.4 MG CAPS capsule Take 0.4 mg by mouth daily.    [provider]  traMADol  (ULTRAM ) 50 MG tablet TAKE 1 TABLET(50 MG) BY MOUTH EVERY 12 HOURS AS NEEDED Patient not taking: Reported on 08/18/2023 06/25/22   Claudene Crystal, PA-C   Inpatient Medications: Scheduled Meds:  Continuous Infusions:  PRN Meds:  Allergies:    Allergies  Allergen Reactions   Topiramate Shortness Of Breath and Other (See Comments)     leg cramps   Atenolol  Other (See Comments)    bradycardia   Lisinopril Cough   Tramadol -Acetaminophen  Cough    Tolerates plain Tramadol    Social History:   Social History   Socioeconomic History   Marital status: Widowed    Spouse name: Not on file   Number of children: Not on file   Years of education: Not on file   Highest education level: 12th grade  Occupational History   Occupation: Retired  Tobacco Use   Smoking status: Former    Current packs/day: 0.00    Average packs/day: 0.5 packs/day for 52.0 years (26.0 ttl pk-yrs)    Types: Cigarettes    Start date: 05/26/1959    Quit date: 05/26/2011    Years since quitting: 12.2    Passive exposure: Past   Smokeless tobacco: Never   Vaping Use   Vaping status: Never Used  Substance and Sexual Activity   Alcohol use: No   Drug use: No   Sexual activity: Not on file  Other Topics Concern   Not on file  Social History Narrative   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  Lives in [place] by himself.      Patient writes with his right hand, though uses his left hand for every thing else. He lives alone in a one level home. He drinks 3 cups of coffee a day and 3 7-8 oz sodas a day. He does not exercise.   Social Drivers of Corporate investment banker Strain: Low Risk  (08/15/2023)   Received from [facility]   Overall Financial Resource Strain (CARDIA)    Difficulty of Paying Living Expenses: Not hard at all  Food Insecurity: No Food Insecurity (08/15/2023)   Received from [facility]   Hunger Vital Sign    Worried About Running Out of Food in the Last Year: Never true    Ran Out of Food in the Last Year: Never true  Transportation Needs: No Transportation Needs (08/15/2023)   Received from [facility] - Transportation    Lack of Transportation (Medical): No    Lack of Transportation (Non-Medical): No  Physical Activity: Inactive (06/01/2022)   Exercise Vital Sign    Days of Exercise per Week: 0 days    Minutes of Exercise per Session: 0 min  Stress: No Stress Concern Present (06/01/2022)   [employer] of Occupational Health - Occupational Stress Questionnaire    Feeling of Stress : Not at all  Social Connections: Unknown (08/21/2021)   Received from [facility], [facility]   Social Network    Social Network: Not on file  Intimate Partner Violence: Unknown (07/13/2021)   Received from [facility], [facility]   HITS    Physically Hurt: Not on file    Insult or Talk Down To: Not on file    Threaten Physical Harm: Not on file    Scream or Curse:  Not on file    Family History:   Family History  Problem Relation Age of Onset   Breast cancer Mother        dx 81s    Hyperlipidemia Mother    Hypertension Mother    COPD Father        deceased   Hyperlipidemia Father    Hypertension Father    Hypertension Sister    Cancer Brother 24       Unknown cancer, possibly stomach   Hypertension Brother    Cancer Brother 57       Unknown, possibly lung   Hypertension Daughter    Hypertension Son    Colon cancer Neg Hx        pt is unclear about family hx    ROS:  Please see the history of present illness.  All other ROS reviewed and negative.     Physical Exam/Data:   Vitals:   08/25/23 0817 08/25/23 0923 08/25/23 0945  BP: (!) 159/80  126/71  Pulse: (!) 59  (!) 59  Resp: 16  15  Temp: (!) 97.4 F (36.3 C)    SpO2: 99%  100%  Weight:  103.8 kg   Height:  5\' 9"  (1.753 m)    No intake or output data in the 24 hours ending 08/25/23 1152    08/25/2023    9:23 AM 08/18/2023    9:25 AM 07/21/2023    4:01 PM  Last 3 Weights  Weight (lbs) 228 lb 12.8 oz 228 lb 12.8 oz 230 lb 9.6 oz  Weight (kg) 103.783 kg 103.783 kg 104.599 kg     Body mass index is 33.79 kg/m.   General:  Well nourished, well developed, in no acute distress, resting, on room air HEENT: normal Neck: no JVD Vascular: No carotid bruits; Distal pulses 2+ bilaterally Cardiac:  normal S1, S2; RRR; no murmur  Lungs:  clear to auscultation bilaterally, no wheezing, rhonchi or rales  Abd: soft, nontender, no hepatomegaly  Ext: no edema Musculoskeletal:  No deformities Skin: warm and dry  Neuro:  no focal abnormalities noted Psych:  Normal affect   EKG:  The EKG was personally reviewed and demonstrates:  AV-paced rhythm, HR 60s, no acute ischemic changes   Telemetry:  Telemetry was personally reviewed and demonstrates:  sinus, HR 60-70s  Relevant CV Studies:  CTA chest 08/02/2023 Aneurysm of the tubular ascending thoracic aorta measuring 4.0 cm. The sinus of Valsalva measures 4.5 cm in largest dimension. Recommend annual imaging followup by CTA or MRA. This recommendation  follows 2010 ACCF/AHA/AATS/ACR/ASA/SCA/SCAI/SIR/STS/SVM Guidelines for the Diagnosis and Management of Patients with Thoracic Aortic Disease. Circulation. 2010; 121: W098-J191. Aortic aneurysm NOS (ICD10-I71.9) Coronary artery atherosclerotic vascular disease.  TEE, 09/02/2022 Left ventricular ejection fraction, by estimation, is 60 to 65% . The left ventricle has normal function. The left ventricle has no regional wall motion abnormalities.  Right ventricular systolic function is normal. The right ventricular size is normal.  No left atrial/ left atrial appendage thrombus was detected.  The mitral valve is normal in structure. Trivial mitral valve regurgitation. No evidence of mitral stenosis.  The aortic valve is normal in structure. Aortic valve regurgitation is trivial. No aortic stenosis is present.  Aortic dilatation noted. There is mild dilatation of the aortic root, measuring 41 mm.  The inferior vena cava is normal in size with greater than 50% respiratory variability, suggesting right atrial pressure of 3 mmHg. Evidence of atrial level shunting detected by color flow  Doppler. There is a small patent foramen ovale with bidirectional shunting across atrial septum.  Cardiac CT, 05/21/2022 The left atrial appendage is a medium size chicken wing type with 2 lobes that is anteriorly directed. There is no thrombus. A 24 mm Watchman FLX device is recommended based on the above landing zone measurements (18.2 mm maximum diameter; 24% compression). Could consider a 20 mm device based on average diameter. An inferior posterior IAS puncture site is recommended. Optimal deployment angle: RAO 14 CAU 0 Severe 3-vessel coronary calcifications.  Prior history of PCI. 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  Dilated pulmonary artery suggestive of pulmonary hypertension.  Nuclear stress, 04/17/2021   The study is normal. The study is low risk.   No ST deviation was noted.   LV perfusion is normal. There is no  evidence of ischemia. There is no evidence of infarction.   Left ventricular function is normal. Nuclear stress EF: 61 %. The left ventricular ejection fraction is normal (55-65%). End diastolic cavity size is normal. End systolic cavity size is normal.   Prior study available for comparison from 03/10/2017.   Normal resting and stress perfusion. No ischemia or infarction EF 61%  LHC , 08/01/2019 Prox RCA lesion is 40% stenosed. Mid RCA lesion is 40% stenosed. Mid RCA to Dist RCA lesion is 50% stenosed. RPDA lesion is 70% stenosed. 3rd RPL lesion is 70% stenosed. Prox Cx to Dist Cx lesion is 40% stenosed. Prox LAD to Mid LAD lesion is 40% stenosed. Mid LAD lesion is 40% stenosed. Dist LAD lesion is 70% stenosed. The left ventricular systolic function is normal. LV end diastolic pressure is normal. The left ventricular ejection fraction is 55-65% by visual estimate. There is no mitral valve regurgitation.   Moderate non-obstructive disease in the LAD and Circumflex Moderate non-obstructive disease in the RCA. Severe disease in the small caliber PDA, too small for PCI Normal LV systolic function   Recommendations: Continue medical management of CAD   Laboratory Data:  High Sensitivity Troponin:   Recent Labs  Lab 08/25/23 0818 08/25/23 1018  TROPONINIHS 9 9     Chemistry Recent Labs  Lab 08/25/23 0818  NA 138  K 3.9  CL 106  CO2 22  GLUCOSE 111*  BUN 12  CREATININE 0.90  CALCIUM  9.0  MG 1.9  GFRNONAA >60  ANIONGAP 10    Recent Labs  Lab 08/25/23 0818  PROT 6.9  ALBUMIN 3.4*  AST 16  ALT 13  ALKPHOS 62  BILITOT 0.4   Lipids No results for input(s): "CHOL", "TRIG", "HDL", "LABVLDL", "LDLCALC", "CHOLHDL" in the last 168 hours.  Hematology Recent Labs  Lab 08/25/23 0818  WBC 5.6  RBC 4.86  HGB 10.2*  HCT 34.4*  MCV 70.8*  MCH 21.0*  MCHC 29.7*  RDW 18.3*  PLT 186   Thyroid   Recent Labs  Lab 08/25/23 0818  TSH 2.754    BNP Recent Labs  Lab  08/25/23 0818  BNP 20.3    DDimer No results for input(s): "DDIMER" in the last 168 hours.  Radiology/Studies:  DG Chest 2 View Result Date: 08/25/2023 CLINICAL DATA:  Chest pain EXAM: CHEST - 2 VIEW COMPARISON:  May 10, 2023 FINDINGS: The heart size and mediastinal contours are within normal limits. Both lungs are clear. The visualized skeletal structures are unremarkable. No change in the left subclavian bipolar ICDF pacemaker device tip of the leads in good position no evidence of discontinuity IMPRESSION: No active cardiopulmonary disease. Electronically Signed  By: Fredrich Jefferson M.D.   On: 08/25/2023 08:38     Assessment and Plan:   Chest pain CAD s/p stent to OM2 in 2014  Has copious ED or urgent care visits regarding chest pain LHC from 07/2019 showed: diffuse moderate non-obstructive CAD, severe disease in PDA too small for PCI Cardiac CT 05/2022 showed: severe 3-vessel coronary calcifications EKG without ischemic changes Troponin negative x 2  Patient reports he uses tramadol  for chest pain relief  Note from PCP this AM states patient requested tramadol  refill but was denied because he was not seen in over a year and he had to return before getting a refill Reviewed PDMP which shows he was given 15 day supply of Tramadol  (30 pills) on 06/2022 and he reports that he just ran out  Denies any active chest pain Requesting tramadol  as it is the only thing that helps his chest pain  Frequent ED and urgent care visits for chest pain Patient would likely require ischemic evaluation at this time with his current symptoms, normal troponin x 2, no acute changes on EKG and nonexertional symptoms that appear chronic  Can follow up with PCP as an outpatient for tramadol  prescription for pain  Complete heart block s/p PPM Paroxysmal atrial fibrillation, failed Watchman, deferred [facility] Patient underwent PPM placement 08/2021 Device detected episodes of A. Fib Placed on Eliquis , was held due  to GI bleed Attempted Watchman placement, failed due to poor vascular access Patient deferred Eliquis  after failed Watchman Device interrogated by rep today, showed normal function, NSR, no arrhythmias, some atrial tachycardia in April  Noted to be in sinus rhythm here   Ascending thoracic aorta aneurysm  Noted to be 4.0 cm on CTA chest 07/2023 Recommended annual imaging for monitoring  Follow as outpatient   Risk Assessment/Risk Scores:      CHA2DS2-VASc Score = 5   This indicates a 7.2% annual risk of stroke. The patient's score is based upon: CHF History: 0 HTN History: 1 Diabetes History: 1 Stroke History: 0 Vascular Disease History: 1 Age Score: 2 Gender Score: 0        For questions or updates, please contact [facility] Please consult [email or web address] for contact info under    Signed, Jiles Mote, PA-C  08/25/2023 11:52 AM

## 2023-08-26 ENCOUNTER — Inpatient Hospital Stay: Admitting: Medical

## 2023-08-31 ENCOUNTER — Ambulatory Visit (INDEPENDENT_AMBULATORY_CARE_PROVIDER_SITE_OTHER): Admitting: Medical

## 2023-08-31 ENCOUNTER — Encounter: Payer: Self-pay | Admitting: Medical

## 2023-08-31 VITALS — BP 130/58 | HR 69 | Wt 227.6 lb

## 2023-08-31 DIAGNOSIS — R079 Chest pain, unspecified: Secondary | ICD-10-CM | POA: Diagnosis not present

## 2023-08-31 DIAGNOSIS — G8929 Other chronic pain: Secondary | ICD-10-CM

## 2023-08-31 DIAGNOSIS — M545 Low back pain, unspecified: Secondary | ICD-10-CM | POA: Diagnosis not present

## 2023-08-31 MED ORDER — TRAMADOL HCL 50 MG PO TABS: 50.0000 mg | ORAL_TABLET | Freq: Two times a day (BID) | ORAL | 0 refills | Status: DC | PRN

## 2023-08-31 NOTE — Progress Notes (Signed)
 Subjective:  Joel [name] is a 83 y.o. male who presents for Chief Complaint  Patient presents with   Follow-up    Hosp. Follow up for  chest pain and needs a refill on tramadol .      Here for hospital follow-up.  He went to emergency department 08/25/2023 for chest pain.  He was discharged after evaluation did not find anything specific that was worrisome  He notes that he gets these lower left chest/abdomen pains periodically.  They can come without warning.  They are not necessarily affected by eating.  No recent injury trauma or fall.  Not affected by muscular activity.  No fever.  No shortness of breath.  No cough.  He last got one of the similar pain 3 days ago.  In the past if he gets this pain if he does not take something it will linger for little while but go away.  The tramadol  in the past really helps.  He would like to have some tramadol  on hand.  Tylenol  does not seem to help the pain.  No other aggravating or relieving factors.    No other c/o.  Past Medical History:  Diagnosis Date   Alpha thalassemia trait    Atypical chest pain    CAD (coronary artery disease)    a. Multiple prior evaluations then 05/2012 s/p BMS to OM2. b. Nuc 11/2012 wnl. c. Cath 2015: patent stent, moderate LAD/RCA dz, treated medically.   Cataract    CHEST PAIN UNSPECIFIED 09/13/2008   Qualifier: Diagnosis of  By: Lauretta Ponto, RN, Heather     CKD (chronic kidney disease), stage II    COPD (chronic obstructive pulmonary disease) (HCC)    Cough secondary to angiotensin converting enzyme inhibitor (ACE-I)    Diabetes mellitus (HCC) 09/07/2019   Diabetes mellitus without complication (HCC)    Diverticulosis    DVT (deep venous thrombosis) (HCC)    Family history of breast cancer in first degree relative    GERD (gastroesophageal reflux disease)    Headache(784.0)    Hx: of   History of blood transfusion    "I've had 2; don't know what it was related to" (11/09/2012)   HLD (hyperlipidemia)    HTN  (hypertension)    Iron deficiency anemia    Lymphoproliferative disorder, low grade B cell (HCC) 05/21/2020   Microcytic anemia    PAF (paroxysmal atrial fibrillation) (HCC)    Peripheral vascular disease (HCC)    a. L pop-tibial bypass 2011, followed by [facility].   Personal history of prostate cancer    Presence of permanent cardiac pacemaker    Rotator cuff injury    "left arm; never repaired" (11/09/2012)   Current Outpatient Medications on File Prior to Visit  Medication Sig Dispense Refill   abiraterone  acetate (ZYTIGA ) 250 MG tablet Take 1,000 mg by mouth daily. On an empty stomach     albuterol  (VENTOLIN  HFA) 108 (90 Base) MCG/ACT inhaler Inhale 2 puffs into the lungs every 6 (six) hours as needed for wheezing or shortness of breath. 24 g 0   amLODipine  (NORVASC ) 10 MG tablet TAKE 1 TABLET EVERY DAY 90 tablet 3   aspirin  EC 81 MG tablet Take 1 tablet (81 mg total) by mouth daily. Swallow whole. 90 tablet 3   brimonidine  (ALPHAGAN ) 0.2 % ophthalmic solution Place 1 drop into both eyes 2 (two) times daily.     Budeson-Glycopyrrol-Formoterol (BREZTRI  AEROSPHERE IN) Inhale 1 puff into the lungs daily as needed (for shortness of breath).  diclofenac  Sodium (VOLTAREN ) 1 % GEL Apply 2 g topically 2 (two) times daily as needed (back pain).     fluticasone -salmeterol (ADVAIR HFA) 115-21 MCG/ACT inhaler Inhale 2 puffs into the lungs 2 (two) times daily. (Patient taking differently: Inhale 2 puffs into the lungs 2 (two) times daily as needed (for shortness of breath).) 1 each 12   gabapentin  (NEURONTIN ) 600 MG tablet Take 600 mg by mouth 2 (two) times daily.     ipratropium-albuterol  (DUONEB) 0.5-2.5 (3) MG/3ML SOLN Take 3 mLs by nebulization every 6 (six) hours as needed. (Patient taking differently: Take 3 mLs by nebulization every 6 (six) hours as needed (Shortness of breath).) 360 mL 1   isosorbide  mononitrate (IMDUR ) 120 MG 24 hr tablet Take 1 tablet (120 mg total) by mouth daily. 90 tablet 3    latanoprost  (XALATAN ) 0.005 % ophthalmic solution Place 1 drop into both eyes at bedtime.     nitroGLYCERIN  (NITROSTAT ) 0.4 MG SL tablet Place 1 tablet (0.4 mg total) under the tongue every 5 (five) minutes as needed for chest pain (max 3 doses). 75 tablet 2   omeprazole (PRILOSEC) 20 MG capsule Take 20 mg by mouth daily.     pravastatin  (PRAVACHOL ) 40 MG tablet TAKE 1 TABLET EVERY EVENING 90 tablet 3   predniSONE  (DELTASONE ) 5 MG tablet Take 5 mg by mouth in the morning.     tamsulosin  (FLOMAX ) 0.4 MG CAPS capsule Take 0.4 mg by mouth every evening.     traMADol  (ULTRAM ) 50 MG tablet Take 1 tablet (50 mg total) by mouth every 12 (twelve) hours as needed. 15 tablet 0   Accu-Chek FastClix Lancets MISC Test once daily (Patient not taking: Reported on 08/31/2023) 102 each 12   ferrous gluconate  (FERGON) 324 MG tablet Take 1 tablet (324 mg total) by mouth daily with breakfast. (Patient not taking: Reported on 08/18/2023) 30 tablet 2   polyethylene glycol powder (GLYCOLAX /MIRALAX ) 17 GM/SCOOP powder Take 17 g by mouth daily. (Patient not taking: Reported on 08/18/2023) 765 g 2   No current facility-administered medications on file prior to visit.    The following portions of the patient's history were reviewed and updated as appropriate: allergies, current medications, past family history, past medical history, past social history, past surgical history and problem list.  ROS Otherwise as in subjective above    Objective: BP (!) 130/58   Pulse 69   Wt 227 lb 9.6 oz (103.2 kg)   SpO2 97%   BMI 33.61 kg/m   General appearance: alert, no distress, well developed, well nourished Neck: supple, no lymphadenopathy, no thyromegaly, no masses Heart: RRR, normal S1, S2, no murmurs Lungs: CTA bilaterally, no wheezes, rhonchi, or rales Pacemaker present left upper chest, no obvious deformity of the chest wall, nontender chest wall anterior and posterior Abdomen: +bs, soft, non tender, non distended,  no masses, no hepatomegaly, no splenomegaly Pulses: 2+ radial pulses, 2+ pedal pulses, normal cap refill Ext: no edema   Chest x-ray 08/25/2023 showed no active cardiopulmonary disease  CT angio chest aorta 08/02/2023: IMPRESSION: 1. Aneurysm of the tubular ascending thoracic aorta measuring 4.0 cm. 2. The sinus of Valsalva measures 4.5 cm in largest dimension. 3. Recommend annual imaging followup by CTA or MRA. This recommendation follows 2010 ACCF/AHA/AATS/ACR/ASA/SCA/SCAI/SIR/STS/SVM Guidelines for the Diagnosis and Management of Patients with Thoracic Aortic Disease. Circulation. 2010; 121: G401-U272. Aortic aneurysm NOS (ICD10-I71.9) 4. Coronary artery atherosclerotic vascular disease.    Echocardiogram TEE: 09/02/23:  IMPRESSIONS   1. Left ventricular  ejection fraction, by estimation, is 60 to 65%. The  left ventricle has normal function. The left ventricle has no regional  wall motion abnormalities.   2. Right ventricular systolic function is normal. The right ventricular  size is normal.   3. No left atrial/left atrial appendage thrombus was detected.   4. The mitral valve is normal in structure. Trivial mitral valve  regurgitation. No evidence of mitral stenosis.   5. The aortic valve is normal in structure. Aortic valve regurgitation is  trivial. No aortic stenosis is present.   6. Aortic dilatation noted. There is mild dilatation of the aortic root,  measuring 41 mm.   7. The inferior vena cava is normal in size with greater than 50%  respiratory variability, suggesting right atrial pressure of 3 mmHg.   8. Evidence of atrial level shunting detected by color flow Doppler.  There is a small patent foramen ovale with bidirectional shunting across  atrial septum.    I reviewed labs from/15/25 Emergency Department notes showing the following: Basic metabolic panel normal except for glucose 111 Troponin negative CBC showing anemia hemoglobin 10.2 but stable Hepatic  panel normal except for albumin 3.4, lipase normal, magnesium normal, TSH normal    Assessment: Encounter Diagnoses  Name Primary?   Chest pain, unspecified type Yes   Chronic low back pain, unspecified back pain laterality, unspecified whether sciatica present      Plan: I reviewed his recent emergency department visit notes, imaging and labs. The pain seems to be chest wall related but not reproducible on exam He seems to get relief with tramadol  but not Tylenol ..  I will give him some tramadol  to use as needed but discussed that this not a every day ongoing chronic pain use but periodic use for worse pain He sees the [facility] seen for follow-up.  He is on bisphosphonates per the [facility] He sees oncology, has a history of prostate cancer and has undergone treatment for this I reviewed a May 2025 orthopedic note from [place] [facility], and he has radiculopathy of lumbar spine and they were going to schedule an epidural steroid injection and then noted that he will be mainly limited to oral medication physical therapy for pain    Nicholes was seen today for follow-up.  Diagnoses and all orders for this visit:  Chest pain, unspecified type  Chronic low back pain, unspecified back pain laterality, unspecified whether sciatica present -     traMADol  (ULTRAM ) 50 MG tablet; Take 1 tablet (50 mg total) by mouth every 12 (twelve) hours as needed.   Follow up: 3 months for med check

## 2023-09-09 ENCOUNTER — Ambulatory Visit (INDEPENDENT_AMBULATORY_CARE_PROVIDER_SITE_OTHER): Payer: Medicare HMO

## 2023-09-09 DIAGNOSIS — I459 Conduction disorder, unspecified: Secondary | ICD-10-CM

## 2023-09-09 LAB — CUP PACEART REMOTE DEVICE CHECK
Battery Remaining Longevity: 77 mo
Battery Remaining Percentage: 79 %
Battery Voltage: 2.99 V
Brady Statistic AP VP Percent: 77 %
Brady Statistic AP VS Percent: 1 %
Brady Statistic AS VP Percent: 22 %
Brady Statistic AS VS Percent: 1 %
Brady Statistic RA Percent Paced: 77 %
Brady Statistic RV Percent Paced: 99 %
Date Time Interrogation Session: 20250530040013
Implantable Lead Connection Status: 753985
Implantable Lead Connection Status: 753985
Implantable Lead Implant Date: 20230525
Implantable Lead Implant Date: 20230525
Implantable Lead Location: 753859
Implantable Lead Location: 753860
Implantable Pulse Generator Implant Date: 20230525
Lead Channel Impedance Value: 410 Ohm
Lead Channel Impedance Value: 460 Ohm
Lead Channel Pacing Threshold Amplitude: 0.75 V
Lead Channel Pacing Threshold Amplitude: 0.75 V
Lead Channel Pacing Threshold Pulse Width: 0.5 ms
Lead Channel Pacing Threshold Pulse Width: 0.5 ms
Lead Channel Sensing Intrinsic Amplitude: 5 mV
Lead Channel Sensing Intrinsic Amplitude: 8.8 mV
Lead Channel Setting Pacing Amplitude: 2 V
Lead Channel Setting Pacing Amplitude: 2.5 V
Lead Channel Setting Pacing Pulse Width: 0.5 ms
Lead Channel Setting Sensing Sensitivity: 2.5 mV
Pulse Gen Model: 2272
Pulse Gen Serial Number: 8084442

## 2023-09-12 DIAGNOSIS — M5416 Radiculopathy, lumbar region: Secondary | ICD-10-CM | POA: Diagnosis not present

## 2023-09-12 DIAGNOSIS — M5116 Intervertebral disc disorders with radiculopathy, lumbar region: Secondary | ICD-10-CM | POA: Diagnosis not present

## 2023-09-12 NOTE — Progress Notes (Signed)
 Cardiology Office Note    Patient Name: Joel [name] Date of Encounter: 09/12/2023  Primary Care Provider:  Claudene Crystal, PA-C Primary Cardiologist:  Joel Batman, MD Primary Electrophysiologist: Joel Byes, MD   Past Medical History    Past Medical History:  Diagnosis Date   Alpha thalassemia trait    Atypical chest pain    CAD (coronary artery disease)    a. Multiple prior evaluations then 05/2012 s/p BMS to OM2. b. Nuc 11/2012 wnl. c. Cath 2015: patent stent, moderate LAD/RCA dz, treated medically.   Cataract    CHEST PAIN UNSPECIFIED 09/13/2008   Qualifier: Diagnosis of  By: Joel Ponto, RN, Joel [name]     CKD (chronic kidney disease), stage II    COPD (chronic obstructive pulmonary disease) (HCC)    Cough secondary to angiotensin converting enzyme inhibitor (ACE-I)    Diabetes mellitus (HCC) 09/07/2019   Diabetes mellitus without complication (HCC)    Diverticulosis    DVT (deep venous thrombosis) (HCC)    Family history of breast cancer in first degree relative    GERD (gastroesophageal reflux disease)    Headache(784.0)    Hx: of   History of blood transfusion    "I've had 2; don't know what it was related to" (11/09/2012)   HLD (hyperlipidemia)    HTN (hypertension)    Iron deficiency anemia    Lymphoproliferative disorder, low grade B cell (HCC) 05/21/2020   Microcytic anemia    PAF (paroxysmal atrial fibrillation) (HCC)    Peripheral vascular disease (HCC)    a. L pop-tibial bypass 2011, followed by [facility].   Personal history of prostate cancer    Presence of permanent cardiac pacemaker    Rotator cuff injury    "left arm; never repaired" (11/09/2012)    History of Present Illness  Joel [name] is a 83 y.o. male with a PMH of CAD s/p LHC with PCI/BMS to second OM, HLD, GERD, HTN, DM type II, tobacco abuse, COPD, microcytic anemia, PAD s/p pop-tibial bypass 2011, CKD stage III, prostate CA, non-Hodgkin's lymphoma/MALT, CHB s/p Saint Jude PPM  08/2021, paroxysmal AF, GI bleed who presents today for 3-week follow-up.  Joel [name] was previously followed by Joel [name] and is currently followed by Joel [name] for management of coronary artery disease.  He reported complaint of chest pain in 2014 and underwent LHC that revealed OM 2 stenosis with severe stenosis treated with BMS x 1.  He was seen initially by Joel [name] in 11/2012 and was seen in the ED on/8/15 with complaint of chest pain with negative troponins.  He has a history of PAD and is followed by [facility] for pop tibial bypass that was completed in 2011.  He was seen again for complaint of chest pain and underwent LHC on 08/01/13 that showed patent stent in the circumflex and mild to moderate disease in the LAD and RCA. He was seen in the ED 09/10/14 with atypical chest pain. Troponin was negative x 2 and EKG was unchanged. Multiple ED visits with chest pain in 2017.  He had a nuclear stress test in 2018 that showed moderate inferior defect with EF of 55% and small region of anterior septal ischemia.  He was diagnosed with prostate CA in 2019.  He underwent a LHC in 07/2019 that showed moderate diffuse CAD but no focal obstructive lesions except in PDA which is too small for stenting.  He was diagnosed with mild of the orbit followed by [facility] oncology.  He  had 2D echo completed 08/2020 with EF of 65% with mild aortic dilation measuring 41 mm.  He was seen by Joel [name] on 03/27/2021 for preoperative clearance prior to EVAR procedure.  He had nuclear stress test completed that showed normal LV perfusion and no evidence of ischemia.He had complete heart block in April 2023 and had a pacemaker placed in May 2023. His device showed episodes of atrial fib. He was started on anti-coagulation but developed GI bleeding. His bleeding resolved when anti-coagulation was stopped.  He also had ASA discontinued and underwent evaluation by Joel [name] for Watchman device and was found to have suitable anatomy and  underwent implant intent but IVC had extensive network of collaterals and catheter could not get into RA. TEE May 2024 with LVEF=60-65%. No valve disease. He is now using CPAP.  He was seen in the ED on 08/25/2023 with complaint of chest pain.  He reported sharp atypical chest pain that was previously relieved with tramadol .  He had workup to rule out ACS that was normal.  He underwent interrogation of Saint Jude PPM that showed no evidence of arrhythmia.  He reported that chest pain only lasted minutes and was discharged and followed up with PCP on 08/31/2023 and was given refill for tramadol .  Joel [name] presents today for posthospital and 77-month follow-up. On May 15th, he experienced sharp, atypical chest pain, different from his previous cardiac pain. He usually manages this pain with tramadol  but initially had difficulty obtaining a refill. A temporary supply was provided by the hospital, and he later obtained a prescription refill. No recurrence of this pain has been reported since then. No abnormal heart rates or swelling in the ankles. He has a history of prostate cancer, for which he underwent treatment and is followed by an oncologist who monitors his condition. He receives follow-up care every six months, including a shot to measure prostate antigen levels. His medications for cardiac health include pravastatin , isosorbide , aspirin , and blood pressure medication. He has a history of anemia and was previously on ferrous gluconate , which he has not taken for a while. He feels tired and is considering resuming iron supplementation. He plans to discuss this with his cancer doctor during his next visit. He experiences chronic constipation, which he manages with over-the-counter stool softeners and occasional enemas. He reports chronic tiredness. Patient denies chest pain, palpitations, dyspnea, PND, orthopnea, nausea, vomiting, dizziness, syncope, edema, weight gain, or early satiety.  Discussed the use  of AI scribe software for clinical note transcription with the patient, who gave verbal consent to proceed.  History of Present Illness   Review of Systems  Please see the history of present illness.    All other systems reviewed and are otherwise negative except as noted above.  Physical Exam     Wt Readings from Last 3 Encounters:  08/31/23 227 lb 9.6 oz (103.2 kg)  08/25/23 228 lb 12.8 oz (103.8 kg)  08/18/23 228 lb 12.8 oz (103.8 kg)   GM:WNUUV were no vitals filed for this visit.,There is no height or weight on file to calculate BMI. GEN: Well nourished, well developed in no acute distress Neck: No JVD; No carotid bruits Pulmonary: Clear to auscultation without rales, wheezing or rhonchi  Cardiovascular: Normal rate. Regular rhythm. Normal S1. Normal S2.   Murmurs: There is no murmur.  ABDOMEN: Soft, non-tender, non-distended EXTREMITIES:  No edema; No deformity   EKG/LABS/ Recent Cardiac Studies   ECG personally reviewed by me today -none completed today  Risk Assessment/Calculations:    CHA2DS2-VASc Score = 5   This indicates a 7.2% annual risk of stroke. The patient's score is based upon: CHF History: 0 HTN History: 1 Diabetes History: 1 Stroke History: 0 Vascular Disease History: 1 Age Score: 2 Gender Score: 0         Lab Results  Component Value Date   WBC 5.6 08/25/2023   HGB 10.2 (L) 08/25/2023   HCT 34.4 (L) 08/25/2023   MCV 70.8 (L) 08/25/2023   PLT 186 08/25/2023   Lab Results  Component Value Date   CREATININE 0.90 08/25/2023   BUN 12 08/25/2023   NA 138 08/25/2023   K 3.9 08/25/2023   CL 106 08/25/2023   CO2 22 08/25/2023   Lab Results  Component Value Date   CHOL 142 07/21/2023   HDL 38 (L) 07/21/2023   LDLCALC 48 07/21/2023   TRIG 378 (H) 07/21/2023   CHOLHDL 3.7 07/21/2023    Lab Results  Component Value Date   HGBA1C 6.2 (H) 05/12/2023   Assessment & Plan    Assessment & Plan  1.  History of CAD/chest pain: -s/p LHC  with PCI/BMS to second OM with most recent ischemic evaluation completed on 04/2021 by Lexiscan  Myoview  that was low risk. -Recent atypical sharp chest pain resolved with tramadol . No recurrence or new cardiac issues. - Continue GDMT with ASA 81 mg, Imdur  120 mg daily, as needed Nitrostat  0.4 mg, pravastatin  40 mg daily  2.  Paroxysmal AF: - Patient is rate controlled in sinus rhythm on auscultation at 86 bpm - He is no longer on AC due to history of GI bleed -Attempted Watchman device but unable to complete due to anatomy - Continue ASA 81 mg  3.  History of CHB: -s/p Saint Jude PPM placed 08/2021 and managed by Dr. Carolynne Citron - Most recent Paceart report showing stable leads and normal device function  4.  Essential hypertension: - Patient's blood pressure today is stable at 114/60 - Continue Norvasc  10 mg daily and Imdur  120 mg daily  5.  Hyperlipidemia: - Patient's last LDL cholesterol was 48 on 04/6107 - Continue pravastatin  40 mg daily  6.  PAD: - Currently followed by [facility] with s/p pop-tibial bypass 2011 - Continue ASA 81 mg and pravastatin  40 mg daily  Disposition: Follow-up with Joel Batman, MD or APP in 6 months    Signed, Francene Ing, Retha Cast, NP 09/12/2023, 8:27 AM [facility]

## 2023-09-13 ENCOUNTER — Encounter: Payer: Self-pay | Admitting: Nurse Practitioner

## 2023-09-13 ENCOUNTER — Ambulatory Visit: Attending: Nurse Practitioner | Admitting: Nurse Practitioner

## 2023-09-13 VITALS — BP 114/60 | HR 86 | Ht 69.0 in | Wt 226.0 lb

## 2023-09-13 DIAGNOSIS — I25118 Atherosclerotic heart disease of native coronary artery with other forms of angina pectoris: Secondary | ICD-10-CM | POA: Diagnosis not present

## 2023-09-13 DIAGNOSIS — I442 Atrioventricular block, complete: Secondary | ICD-10-CM

## 2023-09-13 DIAGNOSIS — E782 Mixed hyperlipidemia: Secondary | ICD-10-CM

## 2023-09-13 DIAGNOSIS — I739 Peripheral vascular disease, unspecified: Secondary | ICD-10-CM

## 2023-09-13 DIAGNOSIS — I480 Paroxysmal atrial fibrillation: Secondary | ICD-10-CM | POA: Diagnosis not present

## 2023-09-13 DIAGNOSIS — I10 Essential (primary) hypertension: Secondary | ICD-10-CM | POA: Diagnosis not present

## 2023-09-13 NOTE — Patient Instructions (Signed)
 Medication Instructions:  Pick up some iron downstairs in the pharmacy. *If you need a refill on your cardiac medications before your next appointment, please call your pharmacy*  Lab Work: None ordered If you have labs (blood work) drawn today and your tests are completely normal, you will receive your results only by: MyChart Message (if you have MyChart) OR A paper copy in the mail If you have any lab test that is abnormal or we need to change your treatment, we will call you to review the results.  Testing/Procedures: None ordered   Follow-Up: At [facility], you and your health needs are our priority.  As part of our continuing mission to provide you with exceptional heart care, our providers are all part of one team.  This team includes your primary Cardiologist (physician) and Advanced Practice Providers or APPs (Physician Assistants and Nurse Practitioners) who all work together to provide you with the care you need, when you need it.  Your next appointment:   6 month(s)  Provider:   Antoinette Batman, MD    We recommend signing up for the patient portal called "MyChart".  Sign up information is provided on this After Visit Summary.  MyChart is used to connect with patients for Virtual Visits (Telemedicine).  Patients are able to view lab/test results, encounter notes, upcoming appointments, etc.  Non-urgent messages can be sent to your provider as well.   To learn more about what you can do with MyChart, go to [email or web address].   Other Instructions

## 2023-09-15 ENCOUNTER — Ambulatory Visit: Payer: Self-pay | Admitting: Internal Medicine

## 2023-09-20 ENCOUNTER — Ambulatory Visit (HOSPITAL_BASED_OUTPATIENT_CLINIC_OR_DEPARTMENT_OTHER): Admitting: Emergency Medicine

## 2023-09-20 DIAGNOSIS — R053 Chronic cough: Secondary | ICD-10-CM

## 2023-09-20 LAB — PULMONARY FUNCTION TEST
DL/VA % pred: 96 %
DL/VA: 3.72 ml/min/mmHg/L
DLCO cor % pred: 90 %
DLCO cor: 20.98 ml/min/mmHg
DLCO unc % pred: 90 %
DLCO unc: 20.98 ml/min/mmHg
FEF 25-75 Post: 3.14 L/s
FEF 25-75 Pre: 2.09 L/s
FEF2575-%Change-Post: 50 %
FEF2575-%Pred-Post: 181 %
FEF2575-%Pred-Pre: 120 %
FEV1-%Change-Post: 8 %
FEV1-%Pred-Post: 102 %
FEV1-%Pred-Pre: 95 %
FEV1-Post: 2.7 L
FEV1-Pre: 2.49 L
FEV1FVC-%Change-Post: 8 %
FEV1FVC-%Pred-Pre: 107 %
FEV6-%Change-Post: 0 %
FEV6-%Pred-Post: 94 %
FEV6-%Pred-Pre: 94 %
FEV6-Post: 3.27 L
FEV6-Pre: 3.27 L
FEV6FVC-%Pred-Post: 107 %
FEV6FVC-%Pred-Pre: 107 %
FVC-%Change-Post: 0 %
FVC-%Pred-Post: 87 %
FVC-%Pred-Pre: 87 %
FVC-Post: 3.27 L
FVC-Pre: 3.27 L
Post FEV1/FVC ratio: 82 %
Post FEV6/FVC ratio: 100 %
Pre FEV1/FVC ratio: 76 %
Pre FEV6/FVC Ratio: 100 %
RV % pred: 39 %
RV: 1.05 L
TLC % pred: 75 %
TLC: 5.15 L

## 2023-09-20 NOTE — Patient Instructions (Signed)
 Full PFT performed today.

## 2023-09-20 NOTE — Progress Notes (Signed)
 Full PFT performed today.

## 2023-09-27 ENCOUNTER — Telehealth: Payer: Self-pay

## 2023-09-27 DIAGNOSIS — H0288A Meibomian gland dysfunction right eye, upper and lower eyelids: Secondary | ICD-10-CM | POA: Diagnosis not present

## 2023-09-27 DIAGNOSIS — C6982 Malignant neoplasm of overlapping sites of left eye and adnexa: Secondary | ICD-10-CM | POA: Diagnosis not present

## 2023-09-27 DIAGNOSIS — Z961 Presence of intraocular lens: Secondary | ICD-10-CM | POA: Diagnosis not present

## 2023-09-27 DIAGNOSIS — H401231 Low-tension glaucoma, bilateral, mild stage: Secondary | ICD-10-CM | POA: Diagnosis not present

## 2023-09-27 DIAGNOSIS — H0101B Ulcerative blepharitis left eye, upper and lower eyelids: Secondary | ICD-10-CM | POA: Diagnosis not present

## 2023-09-27 DIAGNOSIS — H0288B Meibomian gland dysfunction left eye, upper and lower eyelids: Secondary | ICD-10-CM | POA: Diagnosis not present

## 2023-09-27 DIAGNOSIS — H35033 Hypertensive retinopathy, bilateral: Secondary | ICD-10-CM | POA: Diagnosis not present

## 2023-09-27 DIAGNOSIS — H43811 Vitreous degeneration, right eye: Secondary | ICD-10-CM | POA: Diagnosis not present

## 2023-09-27 NOTE — Telephone Encounter (Signed)
 Pt is scheduled to see Irby Mannan, NP tomorrow, 09-28-23, for a f/u on CPAP. Pt is not in [facility]. Pt goes through the [place]. ATC x1 to advise pt to bring in his CPAP machine. LVMTCB

## 2023-09-28 ENCOUNTER — Ambulatory Visit: Admitting: Primary Care

## 2023-09-28 ENCOUNTER — Encounter: Payer: Self-pay | Admitting: Primary Care

## 2023-09-28 ENCOUNTER — Telehealth: Payer: Self-pay

## 2023-09-28 VITALS — BP 118/60 | HR 60 | Temp 98.6°F | Ht 69.0 in | Wt 230.4 lb

## 2023-09-28 DIAGNOSIS — E662 Morbid (severe) obesity with alveolar hypoventilation: Secondary | ICD-10-CM | POA: Diagnosis not present

## 2023-09-28 DIAGNOSIS — R053 Chronic cough: Secondary | ICD-10-CM

## 2023-09-28 NOTE — Patient Instructions (Addendum)
-  CHRONIC COUGH: Chronic cough is a persistent cough that lasts for an extended period. Breathing test looked normal, no significant obstructive lung disease. You can manage your cough with mucus relief (dextromethorphan  and guaifenesin ) as needed. You no longer need to use the Breztri  inhaler. Use albuterol  if you experience shortness of breath or wheezing.  -SLEEP APNEA: Sleep apnea is a condition where you stop breathing for short periods during sleep. Continue using your BiPAP machine nightly as it is functioning properly.  -GASTROESOPHAGEAL REFLUX DISEASE (GERD): GERD is a condition where stomach acid frequently flows back into the tube connecting your mouth and stomach. Reduce your omeprazole to 20 mg once daily and monitor for any worsening of your cough. If your cough worsens, you may need to increase the dose back to twice daily.  -BLACK STOOLS: Black stools can be a sign of gastrointestinal bleeding. Monitor for symptoms such as abdominal pain, dizziness, or blood in your stool. Seek emergency care if these symptoms occur. Follow up with your primary care doctor if the black stools persist without other symptoms.  -DIARRHEA: Diarrhea is the frequent passage of loose, watery stools. Follow the BRAT diet (bananas, rice, applesauce, toast) and use loperamide as needed. Try over the counter imodium (loperamide), take 2 tablets after first loose stool. Then 1 tablet as needed following additional loose stools. Do not take more than 8 mg of loperamide in 24 hours. Consult your primary care doctor or a gastrointestinal specialist if the diarrhea persists.  INSTRUCTIONS:  Monitor for any symptoms such as abdominal pain, dizziness, or blood in your stool and seek emergency care if they occur. Follow up with your primary care doctor if your black stools persist without other symptoms or if your diarrhea does not improve.  Follow-up 6 months with Dr. Byrum or sooner if needed

## 2023-09-28 NOTE — Progress Notes (Signed)
 @Patient  ID: Joel [name], male    DOB: 08/08/1940, 83 y.o.   MRN: [id number]  No chief complaint on file.   Referring provider: Denson Flake, MD  HPI:  DMAURI ROSENOW is an 83 year old male with coronary artery disease and obstructive sleep apnea who presents with chronic cough.  He has experienced a chronic cough for the past eight months, occurring at any time of the day without specific triggers. The cough is sometimes productive of white sputum, but not consistently. No significant nasal or sinus drainage is reported, and he does not experience breathing difficulties associated with the cough. He occasionally uses a DuoNeb nebulizer, but it is not significantly helpful.  He has a history of cough with ACE inhibitors, but he is no longer on these medications. He has a history of gastroesophageal reflux disease (GERD) but rarely experiences heartburn, which could contribute to the cough. He is currently taking omeprazole 20 mg daily for GERD.  He also started an over-the-counter medication, question an antihistamine on the recommendation of his pharmacist.  Again he does not know the name.  His past medical history includes coronary artery disease, diabetes, prior deep vein thrombosis (DVT), GERD, alpha thalassemia trait, paroxysmal atrial fibrillation, and peripheral vascular disease. He has obstructive sleep apnea managed with BiPAP. He is a former smoker with a 15 pack-year history, having quit many years ago.  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  His current medications include prednisone  5 mg daily, omeprazole 20 mg daily, and gabapentin  for headaches. He occasionally uses Advair and has an albuterol  inhaler for  emergency use. It sounds like he was recently given another inhaler -maybe this was Breztri  although he is unsure of the name   Chronic cough Chronic cough Chronic cough for eight months, possibly related to GERD. CT scan showed dependent atelectasis and ground glass opacities. No eosinophilia in blood work, reducing likelihood of asthma or allergy-related cough. - Increase omeprazole dosage temporarily for a few weeks to assess impact on cough. - Perform pulmonary function tests to evaluate for tobacco-related changes. - Discontinue Breztri  and continue using albuterol  or nebulizer as needed. - Review all current medications, including over-the-counter and prescribed, at next visit. - Schedule follow-up appointment after pulmonary function tests are completed.   Follow-up Follow-up plans discussed to reassess chronic cough and review medication regimen. - Schedule follow-up appointment after pulmonary function tests are completed. - Bring all current medications, including over-the-counter and prescribed, to next visit for review.  Obesity hypoventilation syndrome (HCC) On BiPAP and tolerates okay.  Some discomfort.  Managed at cardiology  GERD Will temporarily increase his omeprazole to 20 mg twice daily  COPD (chronic obstructive pulmonary disease) (HCC) He has been trialed on scheduled BD therapy plus ICS, unclear how much COPD he has.  I will hold the Breztri  and/or Advair for now and get formal PFT to quantify his degree of obstruction.   09/28/2023   COPD, OHS, GERD  Tolerating BIPAP  Increased omeprazole 20mg  twice daily Discontinued Breztri , continue Albuterol  prn Ordered for PFTs to assess degree of obstruction, he has been tried on scheduled BD please ICS  Discussed the use of AI scribe software for clinical note transcription with the patient, who gave verbal consent to proceed.  History  of Present Illness  - Interim hx  JUNO BOZARD is an 83 year old male  with sleep apnea and chronic cough who presents for a follow-up visit.  He has a history of sleep apnea and uses a BiPAP machine nightly. The machine was recently reset by the [place] and is currently functioning well, with no issues reported in its use.  He experiences a chronic cough and was previously on Breztri , which was discontinued. He uses albuterol  as needed but has not required it. He occasionally uses an over-the-counter mucus relief medication containing dextromethorphan  and guaifenesin , which he finds effective in managing his cough. He took one tablet yesterday, which alleviated the cough. No shortness of breath is noted.  He is on omeprazole 20 mg twice daily for reflux, which was increased at the last visit. He reports no heartburn. The cough is manageable and only returns occasionally.  He reports a recent episode of diarrhea and has been managing it with saltines and ginger ale. He noticed black stools for the first time in a long time but denies any stomach pain, dizziness, or lightheadedness. He had a colonoscopy earlier this year through the [place].      Pulmonary function testing: 09/20/23 >> FVC 3.27 (87%), FEV1 2.70 (102%), ratio 82, DLCOunc 20.98 (90%)   Blood work: no eosinophils (02/06/2023)  RADIOLOGY CT scan of the chest and aorta: cardiomegaly, pacemaker leads in place, no mediastinal or hilar adenopathy, aneurysm of the tubular ascending thoracic aorta at 4 cm, dependent atelectasis bilaterally with associated ground glass opacities, no overt bronchiectasis (08/02/2023) Chest x-ray: clear lung fields, basilar haziness bilaterally (04/2023)  DIAGNOSTIC TEE: LVEF 60-65%, normal function, normal RV size and function, normal mitral and aortic valves, mild dilatation of the aortic root at 4.1 cm (09/02/2022) BiPAP titration study: available (12/13/2021)           Allergies  Allergen Reactions   Topiramate Shortness Of Breath and Other (See Comments)     leg cramps    Atenolol  Other (See Comments)    bradycardia   Lisinopril Cough   Tramadol -Acetaminophen  Cough    Tolerates plain Tramadol     Immunization History  Administered Date(s) Administered   Fluad Quad(high Dose 65+) 01/10/2019, 01/16/2021, 12/31/2021   Fluad Trivalent(High Dose 65+) 01/10/2023   Influenza, High Dose Seasonal PF 02/08/2017, 01/20/2018   Influenza-Unspecified 01/23/2020, 01/10/2021, 02/10/2022   Moderna Covid-19 Fall Seasonal Vaccine 55yrs & older 04/13/2022   Moderna Covid-19 Vaccine Bivalent Booster 26yrs & up 11/03/2021   Moderna Sars-Covid-2 Vaccination 05/16/2019, 06/13/2019, 02/12/2020, 07/21/2020   Pfizer Covid-19 Vaccine Bivalent Booster 74yrs & up 02/24/2021   Pfizer(Comirnaty)Fall Seasonal Vaccine 12 years and older 01/10/2023   Pneumococcal Conjugate-13 08/21/2019   Pneumococcal Polysaccharide-23 01/14/2011   Pneumococcal-Unspecified 01/23/2020   Tdap 12/23/2015, 01/04/2023   Zoster Recombinant(Shingrix) 05/26/2022, 08/24/2022    Past Medical History:  Diagnosis Date   Alpha thalassemia trait    Atypical chest pain    CAD (coronary artery disease)    a. Multiple prior evaluations then 05/2012 s/p BMS to OM2. b. Nuc 11/2012 wnl. c. Cath 2015: patent stent, moderate LAD/RCA dz, treated medically.   Cataract    CHEST PAIN UNSPECIFIED 09/13/2008   Qualifier: Diagnosis of  By: Lauretta Ponto, RN, Heather     CKD (chronic kidney disease), stage II    COPD (chronic obstructive pulmonary disease) (HCC)    Cough secondary to angiotensin converting enzyme inhibitor (ACE-I)    Diabetes mellitus (HCC) 09/07/2019   Diabetes mellitus without complication (  HCC)    Diverticulosis    DVT (deep venous thrombosis) (HCC)    Family history of breast cancer in first degree relative    GERD (gastroesophageal reflux disease)    Headache(784.0)    Hx: of   History of blood transfusion    I've had 2; don't know what it was related to (11/09/2012)   HLD (hyperlipidemia)    HTN  (hypertension)    Iron deficiency anemia    Lymphoproliferative disorder, low grade B cell (HCC) 05/21/2020   Microcytic anemia    PAF (paroxysmal atrial fibrillation) (HCC)    Peripheral vascular disease (HCC)    a. L pop-tibial bypass 2011, followed by [facility].   Personal history of prostate cancer    Presence of permanent cardiac pacemaker    Rotator cuff injury    left arm; never repaired (11/09/2012)    Tobacco History: Social History   Tobacco Use  Smoking Status Former   Current packs/day: 0.00   Average packs/day: 0.5 packs/day for 52.0 years (26.0 ttl pk-yrs)   Types: Cigarettes   Start date: 05/26/1959   Quit date: 05/26/2011   Years since quitting: 12.3   Passive exposure: Past  Smokeless Tobacco Never   Counseling given: Not Answered   Outpatient Medications Prior to Visit  Medication Sig Dispense Refill   abiraterone  acetate (ZYTIGA ) 250 MG tablet Take 1,000 mg by mouth daily. On an empty stomach     Accu-Chek FastClix Lancets MISC Test once daily 102 each 12   albuterol  (VENTOLIN  HFA) 108 (90 Base) MCG/ACT inhaler Inhale 2 puffs into the lungs every 6 (six) hours as needed for wheezing or shortness of breath. 24 g 0   amLODipine  (NORVASC ) 10 MG tablet TAKE 1 TABLET EVERY DAY 90 tablet 3   aspirin  EC 81 MG tablet Take 1 tablet (81 mg total) by mouth daily. Swallow whole. 90 tablet 3   brimonidine  (ALPHAGAN ) 0.2 % ophthalmic solution Place 1 drop into both eyes 2 (two) times daily.     Budeson-Glycopyrrol-Formoterol (BREZTRI  AEROSPHERE IN) Inhale 1 puff into the lungs daily as needed (for shortness of breath).     diclofenac  Sodium (VOLTAREN ) 1 % GEL Apply 2 g topically 2 (two) times daily as needed (back pain).     ferrous gluconate  (FERGON) 324 MG tablet Take 1 tablet (324 mg total) by mouth daily with breakfast. 30 tablet 2   fluticasone -salmeterol (ADVAIR HFA) 115-21 MCG/ACT inhaler Inhale 2 puffs into the lungs 2 (two) times daily. (Patient taking differently:  Inhale 2 puffs into the lungs 2 (two) times daily as needed (for shortness of breath).) 1 each 12   gabapentin  (NEURONTIN ) 600 MG tablet Take 600 mg by mouth 2 (two) times daily.     ipratropium-albuterol  (DUONEB) 0.5-2.5 (3) MG/3ML SOLN Take 3 mLs by nebulization every 6 (six) hours as needed. (Patient taking differently: Take 3 mLs by nebulization every 6 (six) hours as needed (Shortness of breath).) 360 mL 1   isosorbide  mononitrate (IMDUR ) 120 MG 24 hr tablet Take 1 tablet (120 mg total) by mouth daily. 90 tablet 3   latanoprost  (XALATAN ) 0.005 % ophthalmic solution Place 1 drop into both eyes at bedtime.     nitroGLYCERIN  (NITROSTAT ) 0.4 MG SL tablet Place 1 tablet (0.4 mg total) under the tongue every 5 (five) minutes as needed for chest pain (max 3 doses). 75 tablet 2   omeprazole (PRILOSEC) 20 MG capsule Take 20 mg by mouth daily.     polyethylene glycol powder (  GLYCOLAX /MIRALAX ) 17 GM/SCOOP powder Take 17 g by mouth daily. 765 g 2   pravastatin  (PRAVACHOL ) 40 MG tablet TAKE 1 TABLET EVERY EVENING 90 tablet 3   predniSONE  (DELTASONE ) 5 MG tablet Take 5 mg by mouth in the morning.     tamsulosin  (FLOMAX ) 0.4 MG CAPS capsule Take 0.4 mg by mouth every evening.     traMADol  (ULTRAM ) 50 MG tablet Take 1 tablet (50 mg total) by mouth every 12 (twelve) hours as needed. 15 tablet 0   No facility-administered medications prior to visit.   Review of Systems  Review of Systems  Constitutional: Negative.   HENT: Negative.    Respiratory:  Negative for cough, shortness of breath and wheezing.     Physical Exam  There were no vitals taken for this visit. Physical Exam Constitutional:      General: He is not in acute distress.    Appearance: Normal appearance. He is not ill-appearing.  HENT:     Head: Normocephalic and atraumatic.     Mouth/Throat:     Mouth: Mucous membranes are moist.     Pharynx: Oropharynx is clear.   Cardiovascular:     Rate and Rhythm: Normal rate and regular  rhythm.  Pulmonary:     Effort: Pulmonary effort is normal.     Breath sounds: Normal breath sounds. No wheezing or rhonchi.  Abdominal:     General: There is distension.     Tenderness: There is no abdominal tenderness.   Musculoskeletal:        General: Normal range of motion.   Skin:    General: Skin is warm and dry.   Neurological:     General: No focal deficit present.     Mental Status: He is alert and oriented to person, place, and time. Mental status is at baseline.   Psychiatric:        Mood and Affect: Mood normal.        Behavior: Behavior normal.        Thought Content: Thought content normal.        Judgment: Judgment normal.      Lab Results:  CBC    Component Value Date/Time   WBC 5.6 08/25/2023 0818   RBC 4.86 08/25/2023 0818   HGB 10.2 (L) 08/25/2023 0818   HGB 10.0 (L) 08/13/2022 1046   HGB 11.3 (L) 11/12/2016 0912   HCT 34.4 (L) 08/25/2023 0818   HCT 33.3 (L) 08/13/2022 1046   HCT 35.7 (L) 11/12/2016 0912   PLT 186 08/25/2023 0818   PLT 220 08/13/2022 1046   MCV 70.8 (L) 08/25/2023 0818   MCV 68 (L) 08/13/2022 1046   MCV 69.2 (L) 11/12/2016 0912   MCH 21.0 (L) 08/25/2023 0818   MCHC 29.7 (L) 08/25/2023 0818   RDW 18.3 (H) 08/25/2023 0818   RDW 17.7 (H) 08/13/2022 1046   RDW 17.6 (H) 11/12/2016 0912   LYMPHSABS 1.0 02/06/2023 1110   LYMPHSABS 0.7 02/17/2022 1532   LYMPHSABS 0.8 (L) 11/12/2016 0912   MONOABS 0.3 02/06/2023 1110   MONOABS 0.4 11/12/2016 0912   EOSABS 0.2 02/06/2023 1110   EOSABS 0.2 02/17/2022 1532   BASOSABS 0.1 02/06/2023 1110   BASOSABS 0.0 02/17/2022 1532   BASOSABS 0.1 11/12/2016 0912    BMET    Component Value Date/Time   NA 138 08/25/2023 0818   NA 141 08/13/2022 1046   NA 142 11/12/2016 0912   K 3.9 08/25/2023 0818   K 4.1 11/12/2016 0912  CL 106 08/25/2023 0818   CO2 22 08/25/2023 0818   CO2 26 11/12/2016 0912   GLUCOSE 111 (H) 08/25/2023 0818   GLUCOSE 110 11/12/2016 0912   BUN 12 08/25/2023 0818    BUN 8 08/13/2022 1046   BUN 14.3 11/12/2016 0912   CREATININE 0.90 08/25/2023 0818   CREATININE 1.3 11/12/2016 0912   CALCIUM  9.0 08/25/2023 0818   CALCIUM  9.6 11/12/2016 0912   GFRNONAA >60 08/25/2023 0818   GFRAA 64 02/14/2020 1134    BNP    Component Value Date/Time   BNP 20.3 08/25/2023 0818    ProBNP    Component Value Date/Time   PROBNP 63.1 11/09/2012 0615    Imaging: CUP PACEART REMOTE DEVICE CHECK Result Date: 09/09/2023 PPM scheduled remote reviewed. Normal device function.  Presenting rhythm: AP-VP Multiple AMS events. Longest x 13 min on 09/07/23. OAC contraindicated d/t GI bleed. Watchman failed. Next remote 91 days. AB, CVRS    Assessment & Plan:   1. Chronic cough (Primary)  2. Obesity hypoventilation syndrome (HCC)  Assessment and Plan    Chronic cough Chronic cough previously managed with Breztri  inhaler, now discontinued. Cough is manageable with dextromethorphan  and guaifenesin  as needed. No dyspnea or need for albuterol . Pulmonary function test shows normal lung function at 102% with normal diffusion capacity, no evidence of COPD. - Use dextromethorphan  and guaifenesin  as needed for cough - Discontinue Breztri  inhaler - Use albuterol  if experiencing dyspnea or wheezing  Sleep apnea Sleep apnea managed with BiPAP, which was reset by [facility] and is functioning properly. No issues reported with [facility]AP usage. [facility]. - Continue using BiPAP nightly  Gastroesophageal reflux disease (GERD) GERD managed with omeprazole 20 mg twice daily. No current heartburn. Plan to reduce omeprazole to once daily and monitor for any worsening of cough, which could indicate reflux-related cough. If cough worsens, consider increasing omeprazole back to twice daily. - Reduce omeprazole to 20 mg once daily - Monitor for worsening of cough and report if it occurs  Black stools Recent black stools, possibly indicating gastrointestinal bleeding. Recent  colonoscopy performed this year through [place]. Advised to monitor for symptoms and seek emergency care if symptoms such as abdominal pain, dizziness, or hematochezia occur. - Monitor for symptoms such as abdominal pain, dizziness, or hematochezia - Seek emergency care if symptoms occur - Follow up with primary care if black stools persist without symptoms  Diarrhea Recent onset of diarrhea. Recommended dietary modifications include BRAT diet and use of loperamide as needed. No more than 8 mg of loperamide should be taken in 24 hours. Prefers saltines and ginger ale for management. - Follow BRAT diet: bananas, rice, applesauce, toast - Use loperamide as needed: 2 tablets after first loose stool, then 1 tablet after each subsequent loose stool, not exceeding 4 tablets in 24 hours - Consult primary care or GI if diarrhea persists   Antonio Baumgarten, NP 09/28/2023

## 2023-09-28 NOTE — Telephone Encounter (Signed)
 Pt's CPAP is managed through [facility] [place]. [facility] but [facility] was [facility] to [facility]. I left a vm asking for a return call back in need of a copy of the CPAP compliance.

## 2023-10-04 NOTE — Telephone Encounter (Signed)
 Avruti patel from the [place] is calling to speak with a nurse ashlyn about a patient  cpap they wanted some compliance report . Avruti patel will be faxing over the information requested by Nurse ashlyn . Did call cal no response . Believe they closed early . Relayed info to Ms. Tobie

## 2023-10-24 DIAGNOSIS — M5416 Radiculopathy, lumbar region: Secondary | ICD-10-CM | POA: Diagnosis not present

## 2023-10-26 LAB — BASIC METABOLIC PANEL WITH GFR
BUN: 13 (ref 4–21)
CO2: 26 — AB (ref 13–22)
Chloride: 104 (ref 99–108)
Creatinine: 1.2 (ref 0.6–1.3)
Glucose: 118
Potassium: 4.3 meq/L (ref 3.5–5.1)
Sodium: 139 (ref 137–147)

## 2023-10-26 LAB — LIPID PANEL
Cholesterol: 123 (ref 0–200)
HDL: 47 (ref 35–70)
LDL Cholesterol: 60
LDl/HDL Ratio: 32
Triglycerides: 164 — AB (ref 40–160)

## 2023-10-26 LAB — HEMOGLOBIN A1C
Creatinine, POC: 19 mg/dL
EGFR: 61
Hemoglobin A1C: 6.5
Microalbumin, Urine: <0.3

## 2023-10-26 LAB — COMPREHENSIVE METABOLIC PANEL WITH GFR
Albumin: 4.3 (ref 3.5–5.0)
Calcium: 9 (ref 8.7–10.7)
eGFR: 61

## 2023-10-26 LAB — MICROALBUMIN / CREATININE URINE RATIO: Microalb Creat Ratio: 30

## 2023-10-26 LAB — HEPATIC FUNCTION PANEL
ALT: 16 U/L (ref 10–40)
AST: 13 — AB (ref 14–40)
Alkaline Phosphatase: 77 (ref 25–125)
Bilirubin, Direct: 0.2 (ref 0.01–0.4)
Bilirubin, Total: 0.5

## 2023-10-26 LAB — PROTEIN / CREATININE RATIO, URINE: Creatinine, Urine: 19

## 2023-10-26 LAB — PSA: PSA: 0.05

## 2023-10-26 LAB — MICROALBUMIN, URINE: Microalb, Ur: 0.3

## 2023-10-27 NOTE — Progress Notes (Signed)
 Remote pacemaker transmission.

## 2023-11-08 NOTE — Procedures (Signed)
 SABRA

## 2023-11-16 ENCOUNTER — Ambulatory Visit (INDEPENDENT_AMBULATORY_CARE_PROVIDER_SITE_OTHER): Admitting: Medical

## 2023-11-16 VITALS — BP 120/68 | HR 62 | Temp 97.5°F | Wt 230.4 lb

## 2023-11-16 DIAGNOSIS — E1165 Type 2 diabetes mellitus with hyperglycemia: Secondary | ICD-10-CM

## 2023-11-16 DIAGNOSIS — M545 Low back pain, unspecified: Secondary | ICD-10-CM

## 2023-11-16 DIAGNOSIS — G8929 Other chronic pain: Secondary | ICD-10-CM | POA: Diagnosis not present

## 2023-11-16 DIAGNOSIS — E7800 Pure hypercholesterolemia, unspecified: Secondary | ICD-10-CM | POA: Diagnosis not present

## 2023-11-16 DIAGNOSIS — R053 Chronic cough: Secondary | ICD-10-CM

## 2023-11-16 DIAGNOSIS — Z79899 Other long term (current) drug therapy: Secondary | ICD-10-CM

## 2023-11-16 MED ORDER — BENZONATATE 100 MG PO CAPS: 100.0000 mg | ORAL_CAPSULE | Freq: Two times a day (BID) | ORAL | 0 refills | Status: DC | PRN

## 2023-11-16 MED ORDER — ALBUTEROL SULFATE HFA 108 (90 BASE) MCG/ACT IN AERS: 2.0000 | INHALATION_SPRAY | Freq: Four times a day (QID) | RESPIRATORY_TRACT | 0 refills | Status: DC | PRN

## 2023-11-16 NOTE — Patient Instructions (Signed)
  You saw pulmonology lung doctor for cough back in June.  You had a recent breathing test and the test did NOT support a diagnosis of COPD.  For cough they mentioned that you could be experiencing cough due to acid reflux or mucus  Their recommendations were as follows: You can use over-the-counter guaifenesin /Mucinex  or Mucinex  DM when you have a cough that lingers or is a little worse for several days.  Mucinex  DM helps get up mucus and help with cough suppression  They also recommended using omeprazole 20 mg twice a day when cough is worse or just once a day when you are not coughing as much.  Omeprazole is for acid reflux  They did say that you could use albuterol  rescue inhaler if you feel short of breath or wheezy  They instructed that you no longer need to be on Advair or Breztri  or other COPD medications at this time  I will prescribe a cough drop called Tessalon  Perles today that you can use as needed  Make sure you are drinking 80 to 100 ounces of water daily

## 2023-11-16 NOTE — Progress Notes (Signed)
 Subjective:  Joel [name] is a 83 y.o. male who presents for Chief Complaint  Patient presents with   Acute Visit    Cough that's still there, has been seeing pulmonary a few times recently for this.      Here for cough for the past 2 weeks.  He feels like it is the same cough he has had periodically.  He saw pulmonology in June for the same.  He had gotten better but seems little flared up in the last 2 weeks.  He has some mucus.  No fever, no bodyaches or chills, no nausea or vomiting, no sick contacts.  Currently using over-the-counter cough remedy but does not helping.  He brought in some blood work he had at the [facility] recently in July  He was recently started on Cymbalta once daily by the [facility] for the first week then twice daily for help with back pain.  Prediabetes-no polyuria, polydipsia, blurred vision.  No other aggravating or relieving factors.    No other c/o.  Past Medical History:  Diagnosis Date   Alpha thalassemia trait    Atypical chest pain    CAD (coronary artery disease)    a. Multiple prior evaluations then 05/2012 s/p BMS to OM2. b. Nuc 11/2012 wnl. c. Cath 2015: patent stent, moderate LAD/RCA dz, treated medically.   Cataract    CHEST PAIN UNSPECIFIED 09/13/2008   Qualifier: Diagnosis of  By: Buell, RN, Heather     CKD (chronic kidney disease), stage II    COPD (chronic obstructive pulmonary disease) (HCC)    Cough secondary to angiotensin converting enzyme inhibitor (ACE-I)    Diabetes mellitus (HCC) 09/07/2019   Diabetes mellitus without complication (HCC)    Diverticulosis    DVT (deep venous thrombosis) (HCC)    Family history of breast cancer in first degree relative    GERD (gastroesophageal reflux disease)    Headache(784.0)    Hx: of   History of blood transfusion    I've had 2; don't know what it was related to (11/09/2012)   HLD (hyperlipidemia)    HTN (hypertension)    Iron deficiency anemia    Lymphoproliferative disorder, low grade B  cell (HCC) 05/21/2020   Microcytic anemia    PAF (paroxysmal atrial fibrillation) (HCC)    Peripheral vascular disease (HCC)    a. L pop-tibial bypass 2011, followed by [facility].   Personal history of prostate cancer    Presence of permanent cardiac pacemaker    Rotator cuff injury    left arm; never repaired (11/09/2012)   Current Outpatient Medications on File Prior to Visit  Medication Sig Dispense Refill   abiraterone  acetate (ZYTIGA ) 250 MG tablet Take 1,000 mg by mouth daily. On an empty stomach     amLODipine  (NORVASC ) 10 MG tablet TAKE 1 TABLET EVERY DAY 90 tablet 3   aspirin  EC 81 MG tablet Take 1 tablet (81 mg total) by mouth daily. Swallow whole. 90 tablet 3   brimonidine  (ALPHAGAN ) 0.2 % ophthalmic solution Place 1 drop into both eyes 2 (two) times daily.     diclofenac  Sodium (VOLTAREN ) 1 % GEL Apply 2 g topically 2 (two) times daily as needed (back pain).     DULoxetine (CYMBALTA) 20 MG capsule Take one capsule daily for one week then increase to one capsule twice daily     ferrous gluconate  (FERGON) 324 MG tablet Take 1 tablet (324 mg total) by mouth daily with breakfast. 30 tablet 2   gabapentin  (  NEURONTIN ) 600 MG tablet Take 600 mg by mouth 2 (two) times daily.     isosorbide  mononitrate (IMDUR ) 120 MG 24 hr tablet Take 1 tablet (120 mg total) by mouth daily. 90 tablet 3   latanoprost  (XALATAN ) 0.005 % ophthalmic solution Place 1 drop into both eyes at bedtime.     omeprazole (PRILOSEC) 20 MG capsule Take 20 mg by mouth daily.     polyethylene glycol powder (GLYCOLAX /MIRALAX ) 17 GM/SCOOP powder Take 17 g by mouth daily. 765 g 2   pravastatin  (PRAVACHOL ) 40 MG tablet TAKE 1 TABLET EVERY EVENING 90 tablet 3   predniSONE  (DELTASONE ) 5 MG tablet Take 5 mg by mouth in the morning.     tamsulosin  (FLOMAX ) 0.4 MG CAPS capsule Take 0.4 mg by mouth every evening.     traMADol  (ULTRAM ) 50 MG tablet Take 1 tablet (50 mg total) by mouth every 12 (twelve) hours as needed. 15 tablet 0    Accu-Chek FastClix Lancets MISC Test once daily 102 each 12   nitroGLYCERIN  (NITROSTAT ) 0.4 MG SL tablet Place 1 tablet (0.4 mg total) under the tongue every 5 (five) minutes as needed for chest pain (max 3 doses). 75 tablet 2   No current facility-administered medications on file prior to visit.     The following portions of the patient's history were reviewed and updated as appropriate: allergies, current medications, past family history, past medical history, past social history, past surgical history and problem list.  ROS Otherwise as in subjective above  Objective: BP 120/68   Pulse 62   Temp (!) 97.5 F (36.4 C)   Wt 230 lb 6.4 oz (104.5 kg)   SpO2 98%   BMI 34.02 kg/m   General appearance: alert, no distress, well developed, well nourished HEENT: normocephalic, sclerae anicteric, conjunctiva pink and moist, TMs pearly, nares patent, no discharge or erythema, pharynx normal Oral cavity: MMM, no lesions Neck: supple, no lymphadenopathy, no thyromegaly, no masses Heart: RRR, normal S1, S2, no murmurs Lungs: CTA bilaterally, no wheezes, rhonchi, or rales Pulses: 2+ radial pulses, 2+ pedal pulses, normal cap refill Ext: no edema   Assessment: Encounter Diagnoses  Name Primary?   Chronic cough Yes   Controlled type 2 diabetes mellitus with hyperglycemia, without long-term current use of insulin  (HCC)    Chronic low back pain, unspecified back pain laterality, unspecified whether sciatica present    Pure hypercholesterolemia    Medication management      Plan: Chronic cough-I reviewed his pulmonology notes from 09/28/2023 including PFT.  They have ruled out COPD.  I reiterated to him that he no longer needs to be on Advair or nebulizer therapy as he had really no significant response to inhalers.  We discussed the pulmonology recommendations for when he gets cough.  I have reviewed that with him again and printed out as a summary today as below.  You saw pulmonology  lung doctor for cough back in June.  You had a recent breathing test and the test did NOT support a diagnosis of COPD.  For cough they mentioned that you could be experiencing cough due to acid reflux or mucus  Their recommendations were as follows: You can use over-the-counter guaifenesin /Mucinex  or Mucinex  DM when you have a cough that lingers or is a little worse for several days.  Mucinex  DM helps get up mucus and help with cough suppression  They also recommended using omeprazole 20 mg twice a day when cough is worse or just once a day  when you are not coughing as much.  Omeprazole is for acid reflux  They did say that you could use albuterol  rescue inhaler if you feel short of breath or wheezy  They instructed that you no longer need to be on Advair or Breztri  or other COPD medications at this time  I will prescribe a cough drop called Tessalon  Perles today that you can use as needed  Make sure you are drinking 80 to 100 ounces of water daily   I reviewed his recent blood work from the [facility].  Blood work from October 26, 2023 shows a hemoglobin A1c of 6.5%, lipid panel shows LDL 60, triglycerides 164 slightly elevated, total cholesterol 123, recent liver panel normal, electrolytes normal, microalbumin not elevated.  Controlled diabetes not on medication for diabetes.  He has several medications on board already so he will continue with diet control.  Counseled on diet and exercise today.  He has been eating some apple fritters.  We discussed avoiding sweets.  Hyperlipidemia-continue current medications, Pravachol  40 mg daily  Continue other medications as usual.  I removed Advair and nebulizer therapy from the chart medication record.   Chronic back pain-recently Cymbalta was added by the [facility] to help with pain.  Joel [name] was seen today for acute visit.  Diagnoses and all orders for this visit:  Chronic cough  Controlled type 2 diabetes mellitus with hyperglycemia,  without long-term current use of insulin  (HCC)  Chronic low back pain, unspecified back pain laterality, unspecified whether sciatica present  Pure hypercholesterolemia  Medication management -     albuterol  (VENTOLIN  HFA) 108 (90 Base) MCG/ACT inhaler; Inhale 2 puffs into the lungs every 6 (six) hours as needed for wheezing or shortness of breath.  Other orders -     benzonatate  (TESSALON ) 100 MG capsule; Take 1 capsule (100 mg total) by mouth 2 (two) times daily as needed for cough.    Follow up: 32mo

## 2023-11-21 ENCOUNTER — Other Ambulatory Visit: Payer: Self-pay | Admitting: Cardiovascular Disease

## 2023-11-24 DIAGNOSIS — M48062 Spinal stenosis, lumbar region with neurogenic claudication: Secondary | ICD-10-CM | POA: Diagnosis not present

## 2023-11-24 DIAGNOSIS — M5416 Radiculopathy, lumbar region: Secondary | ICD-10-CM | POA: Diagnosis not present

## 2023-11-25 ENCOUNTER — Ambulatory Visit (INDEPENDENT_AMBULATORY_CARE_PROVIDER_SITE_OTHER): Admitting: Medical

## 2023-11-25 VITALS — BP 130/80 | HR 63 | Temp 99.4°F | Ht <= 58 in | Wt 230.0 lb

## 2023-11-25 DIAGNOSIS — I7121 Aneurysm of the ascending aorta, without rupture: Secondary | ICD-10-CM | POA: Diagnosis not present

## 2023-11-25 DIAGNOSIS — I70211 Atherosclerosis of native arteries of extremities with intermittent claudication, right leg: Secondary | ICD-10-CM | POA: Diagnosis not present

## 2023-11-25 DIAGNOSIS — I723 Aneurysm of iliac artery: Secondary | ICD-10-CM | POA: Diagnosis not present

## 2023-11-25 DIAGNOSIS — I739 Peripheral vascular disease, unspecified: Secondary | ICD-10-CM

## 2023-11-25 NOTE — Progress Notes (Signed)
 Subjective:  Joel [name] is a 83 y.o. male who presents for Chief Complaint  Patient presents with   Consult    Needs referral for vascular and vein specialist for check up on legs. Been 3 years since been there     Here for referral.  He has not seen vascular and vein in 3 years.  Needs a new referral.  He has a history of peripheral vascular disease and aneurysm.  He has no new symptoms of complaint today  He is compliant with his medications  No other aggravating or relieving factors.    No other c/o.  Past Medical History:  Diagnosis Date   Alpha thalassemia trait    Atypical chest pain    CAD (coronary artery disease)    a. Multiple prior evaluations then 05/2012 s/p BMS to OM2. b. Nuc 11/2012 wnl. c. Cath 2015: patent stent, moderate LAD/RCA dz, treated medically.   Cataract    CHEST PAIN UNSPECIFIED 09/13/2008   Qualifier: Diagnosis of  By: Buell, RN, Heather     CKD (chronic kidney disease), stage II    COPD (chronic obstructive pulmonary disease) (HCC)    Cough secondary to angiotensin converting enzyme inhibitor (ACE-I)    Diabetes mellitus (HCC) 09/07/2019   Diabetes mellitus without complication (HCC)    Diverticulosis    DVT (deep venous thrombosis) (HCC)    Family history of breast cancer in first degree relative    GERD (gastroesophageal reflux disease)    Headache(784.0)    Hx: of   History of blood transfusion    I've had 2; don't know what it was related to (11/09/2012)   HLD (hyperlipidemia)    HTN (hypertension)    Iron deficiency anemia    Lymphoproliferative disorder, low grade B cell (HCC) 05/21/2020   Microcytic anemia    PAF (paroxysmal atrial fibrillation) (HCC)    Peripheral vascular disease (HCC)    a. L pop-tibial bypass 2011, followed by [facility].   Personal history of prostate cancer    Presence of permanent cardiac pacemaker    Rotator cuff injury    left arm; never repaired (11/09/2012)   Current Outpatient Medications on File  Prior to Visit  Medication Sig Dispense Refill   amLODipine  (NORVASC ) 10 MG tablet TAKE 1 TABLET EVERY DAY 90 tablet 1   aspirin  EC 81 MG tablet Take 1 tablet (81 mg total) by mouth daily. Swallow whole. 90 tablet 3   brimonidine  (ALPHAGAN ) 0.2 % ophthalmic solution Place 1 drop into both eyes 2 (two) times daily.     diclofenac  Sodium (VOLTAREN ) 1 % GEL Apply 2 g topically 2 (two) times daily as needed (back pain).     DULoxetine (CYMBALTA) 20 MG capsule Take one capsule daily for one week then increase to one capsule twice daily     ferrous gluconate  (FERGON) 324 MG tablet Take 1 tablet (324 mg total) by mouth daily with breakfast. 30 tablet 2   gabapentin  (NEURONTIN ) 600 MG tablet Take 600 mg by mouth 2 (two) times daily.     isosorbide  mononitrate (IMDUR ) 120 MG 24 hr tablet Take 1 tablet (120 mg total) by mouth daily. 90 tablet 3   latanoprost  (XALATAN ) 0.005 % ophthalmic solution Place 1 drop into both eyes at bedtime.     omeprazole (PRILOSEC) 20 MG capsule Take 20 mg by mouth daily.     polyethylene glycol powder (GLYCOLAX /MIRALAX ) 17 GM/SCOOP powder Take 17 g by mouth daily. 765 g 2   pravastatin  (  PRAVACHOL ) 40 MG tablet TAKE 1 TABLET EVERY EVENING 90 tablet 1   predniSONE  (DELTASONE ) 5 MG tablet Take 5 mg by mouth in the morning.     tamsulosin  (FLOMAX ) 0.4 MG CAPS capsule Take 0.4 mg by mouth every evening.     traMADol  (ULTRAM ) 50 MG tablet Take 1 tablet (50 mg total) by mouth every 12 (twelve) hours as needed. 15 tablet 0   abiraterone  acetate (ZYTIGA ) 250 MG tablet Take 1,000 mg by mouth daily. On an empty stomach     Accu-Chek FastClix Lancets MISC Test once daily 102 each 12   albuterol  (VENTOLIN  HFA) 108 (90 Base) MCG/ACT inhaler Inhale 2 puffs into the lungs every 6 (six) hours as needed for wheezing or shortness of breath. 8 g 0   nitroGLYCERIN  (NITROSTAT ) 0.4 MG SL tablet Place 1 tablet (0.4 mg total) under the tongue every 5 (five) minutes as needed for chest pain (max 3  doses). 75 tablet 2   No current facility-administered medications on file prior to visit.     The following portions of the patient's history were reviewed and updated as appropriate: allergies, current medications, past family history, past medical history, past social history, past surgical history and problem list.  ROS Otherwise as in subjective above  Objective: BP 130/80   Pulse 63   Wt 230 lb (104.3 kg)   BMI 33.97 kg/m   Wt Readings from Last 3 Encounters:  11/25/23 230 lb (104.3 kg)  11/16/23 230 lb 6.4 oz (104.5 kg)  09/28/23 230 lb 6.4 oz (104.5 kg)    General appearance: alert, no distress, well developed, well nourished Heart: RRR, normal S1, S2, no murmurs Abdomen: +bs, soft, non tender, non distended, no masses, no hepatomegaly, no splenomegaly, no obvious bruits Pulses: 2+ radial pulses, 1+ pedal pulses, normal cap refill Ext: 1+ bilat LE nonpitting edema    Assessment: Encounter Diagnoses  Name Primary?   Aneurysm of iliac artery (HCC) Yes   Aneurysm of ascending aorta without rupture (HCC)    Atherosclerosis of native artery of right lower extremity with intermittent claudication (HCC)    PVD (peripheral vascular disease) with claudication (HCC)      Plan: I reviewed prior prior imaging as below.  It has been a few years since he has seen vascular surgery.  No new symptoms of concern.  He is walking some for exercise.  Continue current medications.  Blood pressure okay today.  No new claudication symptoms.    ABI reviewed from January 08, 2021: Summary:  Right: Resting right ankle-brachial index indicates moderate right lower  extremity arterial disease. The right toe-brachial index is abnormal.   Left: Resting left ankle-brachial index is within normal range. No  evidence of significant left lower extremity arterial disease. The left  toe-brachial index is normal.    Abdominal aortic aneurysm ultrasound reviewed from  08/08/2020: Summary:  The right common iliac artery aneurysm measures 4.5 x 4.9 cms, limited  visualization. Unable to visualize or evaluate the aortic endograft.     TEE echocardiogram 09/02/2022: IMPRESSIONS   1. Left ventricular ejection fraction, by estimation, is 60 to 65%. The  left ventricle has normal function. The left ventricle has no regional  wall motion abnormalities.   2. Right ventricular systolic function is normal. The right ventricular  size is normal.   3. No left atrial/left atrial appendage thrombus was detected.   4. The mitral valve is normal in structure. Trivial mitral valve  regurgitation. No evidence of  mitral stenosis.   5. The aortic valve is normal in structure. Aortic valve regurgitation is  trivial. No aortic stenosis is present.   6. Aortic dilatation noted. There is mild dilatation of the aortic root,  measuring 41 mm.   7. The inferior vena cava is normal in size with greater than 50%  respiratory variability, suggesting right atrial pressure of 3 mmHg.   8. Evidence of atrial level shunting detected by color flow Doppler.  There is a small patent foramen ovale with bidirectional shunting across  atrial septum.   Chrystian was seen today for consult.  Diagnoses and all orders for this visit:  Aneurysm of iliac artery (HCC) -     Ambulatory referral to Vascular Surgery  Aneurysm of ascending aorta without rupture [facility]) -     Ambulatory referral to Vascular Surgery  Atherosclerosis of native artery of right lower extremity with intermittent claudication (HCC) -     Ambulatory referral to Vascular Surgery  PVD (peripheral vascular disease) with claudication (HCC) -     Ambulatory referral to Vascular Surgery    Follow up: pending referral

## 2023-12-01 ENCOUNTER — Other Ambulatory Visit: Payer: Self-pay

## 2023-12-01 DIAGNOSIS — I712 Thoracic aortic aneurysm, without rupture, unspecified: Secondary | ICD-10-CM

## 2023-12-01 DIAGNOSIS — I70219 Atherosclerosis of native arteries of extremities with intermittent claudication, unspecified extremity: Secondary | ICD-10-CM

## 2023-12-08 ENCOUNTER — Ambulatory Visit (INDEPENDENT_AMBULATORY_CARE_PROVIDER_SITE_OTHER)

## 2023-12-08 ENCOUNTER — Encounter (HOSPITAL_COMMUNITY): Payer: Self-pay

## 2023-12-08 ENCOUNTER — Ambulatory Visit (HOSPITAL_COMMUNITY)
Admission: EM | Admit: 2023-12-08 | Discharge: 2023-12-08 | Disposition: A | Discharge status: Home / Self Care | Attending: Nurse Practitioner | Admitting: Nurse Practitioner

## 2023-12-08 DIAGNOSIS — R058 Other specified cough: Secondary | ICD-10-CM | POA: Diagnosis not present

## 2023-12-08 DIAGNOSIS — R059 Cough, unspecified: Secondary | ICD-10-CM

## 2023-12-08 DIAGNOSIS — I700 Atherosclerosis of aorta: Secondary | ICD-10-CM | POA: Diagnosis not present

## 2023-12-08 DIAGNOSIS — Z950 Presence of cardiac pacemaker: Secondary | ICD-10-CM | POA: Diagnosis not present

## 2023-12-08 DIAGNOSIS — R053 Chronic cough: Secondary | ICD-10-CM | POA: Diagnosis not present

## 2023-12-08 MED ORDER — METHYLPREDNISOLONE 4 MG PO TBPK
ORAL_TABLET | ORAL | 0 refills | Status: DC
Start: 2023-12-08 — End: 2024-01-05

## 2023-12-08 MED ORDER — MUCINEX DM MAXIMUM STRENGTH 60-1200 MG PO TB12: 1.0000 | ORAL_TABLET | Freq: Two times a day (BID) | ORAL | 0 refills | Status: DC

## 2023-12-08 MED ORDER — AMOXICILLIN 875 MG PO TABS: 875.0000 mg | ORAL_TABLET | ORAL | 0 refills | Status: AC

## 2023-12-08 NOTE — ED Triage Notes (Signed)
 Patient c/o a productive cough with clear sputum and nasal congestion with clear mucus x 1 week.  Patient states he has been taking Alka Seltzer Cold Plus and Tessalon  for cough from his PCP and Ginger hard candy.

## 2023-12-08 NOTE — Discharge Instructions (Signed)
 You were seen today for a cough that has been ongoing for about two weeks. Your chest x-ray today did not show any signs of pneumonia or other serious lung problems. This type of cough can be caused by several factors, including irritation from acid reflux, airway inflammation, or a lingering infection.  You have been prescribed a short course of steroids and antibiotics to help with possible inflammation and infection. Please take the medications exactly as directed and complete the full course. You may continue taking Tessalon  Perles as needed for cough relief and use Mucinex  DM twice daily to help thin mucus and make coughing easier. If you develop wheezing or shortness of breath, you may use your albuterol  inhaler or nebulizer as prescribed. Continue your omeprazole daily to manage acid reflux, which may also contribute to your cough.  At home, drink plenty of fluids to stay hydrated, rest as needed, and consider using a humidifier to keep the air moist. Warm fluids such as tea with honey may also help soothe your throat and reduce coughing. Avoid exposure to secondhand smoke, or other irritants that can make coughing worse.  Please follow up with your primary care provider or pulmonologist if your symptoms do not improve after finishing the prescribed treatment.  Go to the emergency department right away if you experience worsening shortness of breath, chest pain, coughing up blood, high fever, confusion, or if you feel that you are unable to breathe comfortably.

## 2023-12-08 NOTE — ED Provider Notes (Signed)
 [facility]    CSN: [id number] Arrival date & time: 12/08/23  1243      History   Chief Complaint Chief Complaint  Patient presents with   Cough   Nasal Congestion    HPI Joel [name] is a 83 y.o. male.   Discussed the use of AI scribe software for clinical note transcription with the patient, who gave verbal consent to proceed.   [name] presents to Urgent Care with a persistent cough of 2 weeks duration. He has a history of being previously diagnosed with COPD, which was recently ruled out by pulmonary function testing.  The patient reports a dry cough without productive sputum. He denies any associated shortness of breath or wheezing. [name] also denies fever, nasal congestion, runny nose, or sore throat. He reports experiencing headaches, which he attributes to the coughing. The patient denies any nausea, vomiting, or diarrhea. He has been using Alka-Seltzer Cold and was prescribed Tessalon  Perles by his doctor, but found them ineffective and discontinued use.  [name] saw his primary care physician earlier this month on the 6th regarding this cough. He also had a pulmonary function test on June 10th and saw a pulmonologist on June 18th. The pulmonologist suggested his chronic cough might be related to acid reflux and recommended increasing his omeprazole dosage. However, [name] reports no noticeable improvement with this change. The pulmonologist discontinued his Advair and Brextra treatments but advised he could continue using albuterol  or a nebulizer if needed.  The patient admits to being a long-time smoker who has been trying to quit. He denies any chest pain or palpitations. His last chest x-ray was in May.    Past Medical History:  Diagnosis Date   Alpha thalassemia trait    Atypical chest pain    CAD (coronary artery disease)    a. Multiple prior evaluations then 05/2012 s/p BMS to OM2. b. Nuc 11/2012 wnl. c. Cath 2015: patent stent, moderate  LAD/RCA dz, treated medically.   Cancer [facility])    Cataract    CHEST PAIN UNSPECIFIED 09/13/2008   Qualifier: Diagnosis of  By: Buell, RN, Heather     CKD (chronic kidney disease), stage II    COPD (chronic obstructive pulmonary disease) (HCC)    Cough secondary to angiotensin converting enzyme inhibitor (ACE-I)    Diabetes mellitus (HCC) 09/07/2019   Diabetes mellitus without complication (HCC)    Diverticulosis    DVT (deep venous thrombosis) (HCC)    Family history of breast cancer in first degree relative    GERD (gastroesophageal reflux disease)    Headache(784.0)    Hx: of   History of blood transfusion    I've had 2; don't know what it was related to (11/09/2012)   HLD (hyperlipidemia)    HTN (hypertension)    Iron deficiency anemia    Lymphoproliferative disorder, low grade B cell (HCC) 05/21/2020   Microcytic anemia    PAF (paroxysmal atrial fibrillation) (HCC)    Peripheral vascular disease (HCC)    a. L pop-tibial bypass 2011, followed by [facility].   Personal history of prostate cancer    Presence of permanent cardiac pacemaker    Rotator cuff injury    left arm; never repaired (11/09/2012)    Patient Active Problem List   Diagnosis Date Noted   Aortic aneurysm (HCC) 08/18/2023   Vitamin D  deficiency 05/20/2023   Vitamin deficiency 05/12/2023   Fatigue 05/12/2023   Does mobilize using cane 05/12/2023   Paroxysmal  atrial fibrillation (HCC) 09/02/2022   Chronic constipation 06/09/2022   Pulmonary nodule 02/17/2022   Anemia 01/22/2022   Pacemaker 12/17/2021   Heart block 08/04/2021   AV block, Mobitz 1 06/26/2021   Constipation, unspecified 05/11/2021   Non-Hodgkin lymphoma, unspecified, unspecified site (HCC) 05/11/2021   Encounter for therapeutic drug level monitoring 05/11/2021   Alpha thalassaemia minor 12/22/2020   Benign hypertensive heart disease 12/22/2020   Contact with and (suspected) exposure to other environmental pollution 12/22/2020   Aortic  atherosclerosis (HCC) 07/17/2020   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   Lymphoproliferative disorder, low grade B cell (HCC)-Stage 4 05/21/2020   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   Carcinoma of prostate (HCC) 09/06/2019   Sleep related hypoxia 04/03/2019   Obesity (BMI 30-39.9) 04/03/2019   Snoring 04/03/2019   At risk for sleep apnea 01/10/2019   Daytime somnolence 01/10/2019   Controlled type 2 diabetes mellitus with hyperglycemia, without long-term current use of insulin  (HCC) 12/11/2018   Hypoxic encephalopathy (HCC) 12/11/2018   Obesity hypoventilation syndrome (HCC) 12/11/2018   Chronic low back pain 09/11/2018   Seasonal allergies 07/04/2018   Pulsatile tinnitus 02/17/2018   Dizziness 01/20/2018   Malaise 01/20/2018   Decreased breath sounds 01/20/2018   Needs flu shot 01/20/2018   Chronic nonintractable headache 12/07/2017   Back pain 11/01/2017   Radicular pain of lower extremity 11/01/2017   Prostate cancer (HCC) 11/01/2017   Family history of breast cancer in first degree relative    Adenomatous polyp of colon 04/12/2017   Elliptocytosis on peripheral blood smear (HCC) 09/13/2016   PVD (peripheral vascular disease) with claudication (HCC) 11/15/2012   Hypokalemia 11/10/2012   Bradycardia 11/09/2012   Thrombocytopenia (HCC) 06/01/2012   Microcytic anemia    Atherosclerosis of native artery of extremity with intermittent claudication (HCC) 06/16/2011   Stable angina (HCC) 06/11/2011   CAD in native artery    Chest pain, atypical 05/30/2011   Iron deficiency anemia 10/03/2009   Diverticulosis of colon 10/03/2009   Transient cerebral ischemia 06/05/2008   SHOULDER PAIN, LEFT 10/11/2007   Chronic cough 05/07/2007   Hyperlipidemia 04/21/2007   Essential hypertension 04/21/2007   GERD 04/21/2007    Past Surgical History:  Procedure Laterality Date   CARDIOVASCULAR STRESS TEST  Aug. 2014   CATARACT EXTRACTION W/  INTRAOCULAR LENS IMPLANT Right ~ 2008   CIRCUMCISION     COLONOSCOPY     CORONARY ANGIOPLASTY WITH STENT PLACEMENT  2014   1 (11/09/2012)   ILIAC ARTERY ANEURYSM REPAIR     LEFT ATRIAL APPENDAGE OCCLUSION N/A 09/02/2022   Procedure: LEFT ATRIAL APPENDAGE OCCLUSION;  Surgeon: Cindie Ole DASEN, MD;  [facility];  Service: Cardiovascular;  Laterality: N/A;   LEFT HEART CATH  05-31-12   LEFT HEART CATH AND CORONARY ANGIOGRAPHY N/A 08/01/2019   Procedure: LEFT HEART CATH AND CORONARY ANGIOGRAPHY;  Surgeon: Verlin Lonni BIRCH, MD;  [facility];  Service: Cardiovascular;  Laterality: N/A;   LEFT HEART CATHETERIZATION WITH CORONARY ANGIOGRAM N/A 06/01/2011   Procedure: LEFT HEART CATHETERIZATION WITH CORONARY ANGIOGRAM;  Surgeon: Debby BIRCH Como, MD;  Location: [facility] CATH LAB;  Service: Cardiovascular;  Laterality: N/A;   LEFT HEART CATHETERIZATION WITH CORONARY ANGIOGRAM N/A 05/31/2012   Procedure: LEFT HEART CATHETERIZATION WITH CORONARY ANGIOGRAM;  Surgeon: Peter M Swaziland, MD;  Location: [facility] CATH LAB;  Service: Cardiovascular;  Laterality: N/A;   LEFT HEART CATHETERIZATION WITH CORONARY ANGIOGRAM N/A 08/01/2013   Procedure: LEFT  HEART CATHETERIZATION WITH CORONARY ANGIOGRAM;  Surgeon: Lonni JONETTA Cash, MD;  Location: [facility] CATH LAB;  Service: Cardiovascular;  Laterality: N/A;   PACEMAKER IMPLANT N/A 09/03/2021   Procedure: PACEMAKER IMPLANT;  Surgeon: Waddell Danelle ORN, MD;  [facility];  Service: Cardiovascular;  Laterality: N/A;   PERCUTANEOUS CORONARY STENT INTERVENTION (PCI-S)  05/31/2012   Procedure: PERCUTANEOUS CORONARY STENT INTERVENTION (PCI-S);  Surgeon: Peter M Swaziland, MD;  Location: [facility] CATH LAB;  Service: Cardiovascular;;   PR VEIN BYPASS GRAFT,AORTO-FEM-POP Left 10/01/2009   left below knee popliteal artery to posterior tibial artery   SHOULDER ARTHROSCOPY WITH OPEN ROTATOR CUFF REPAIR AND DISTAL CLAVICLE ACROMINECTOMY Left 01/12/2013    Procedure: LEFT SHOULDER ARTHROSCOPY WITH DEBRIDEMENT OPEN DISTAL CLAVICLE RESECTION ,acromioplastyAND ROTATOR CUFF REPAIR;  Surgeon: ORN JONETTA Shari Mickey., MD;  Location: [facility];  Service: Orthopedics;  Laterality: Left;   TEE WITHOUT CARDIOVERSION N/A 09/02/2022   Procedure: TRANSESOPHAGEAL ECHOCARDIOGRAM;  Surgeon: Cindie Ole DASEN, MD;  Location: [facility] INVASIVE CV LAB;  Service: Cardiovascular;  Laterality: N/A;       Home Medications    Prior to Admission medications   Medication Sig Start Date End Date Taking? Authorizing Provider  amoxicillin  (AMOXIL ) 875 MG tablet Take 1 tablet (875 mg total) by mouth 2 (two) times daily at 8 am and 6 pm for 7 days. 12/08/23 12/15/23 Yes Iola Lukes, FNP  Dextromethorphan -guaiFENesin  (MUCINEX  DM MAXIMUM STRENGTH) 60-1200 MG TB12 Take 1 tablet by mouth 2 (two) times daily. 12/08/23  Yes Iola Lukes, FNP  methylPREDNISolone  (MEDROL  DOSEPAK) 4 MG TBPK tablet Take as directed 12/08/23  Yes Iola Lukes, FNP  abiraterone  acetate (ZYTIGA ) 250 MG tablet Take 250 mg by mouth daily. On an empty stomach 02/17/18   [provider]  Accu-Chek FastClix Lancets MISC Test once daily 11/23/18   Henson, Vickie L, NP-C  albuterol  (VENTOLIN  HFA) 108 (90 Base) MCG/ACT inhaler Inhale 2 puffs into the lungs every 6 (six) hours as needed for wheezing or shortness of breath. 11/16/23   Tysinger, Alm RAMAN, PA-C  amLODipine  (NORVASC ) 10 MG tablet TAKE 1 TABLET EVERY DAY 11/23/23   Cash Lonni JONETTA, MD  aspirin  EC 81 MG tablet Take 1 tablet (81 mg total) by mouth daily. Swallow whole. 03/17/22   Cash Lonni JONETTA, MD  brimonidine  (ALPHAGAN ) 0.2 % ophthalmic solution Place 1 drop into both eyes 2 (two) times daily. 05/09/20   [provider]  diclofenac  Sodium (VOLTAREN ) 1 % GEL Apply 2 g topically 2 (two) times daily as needed (back pain). 06/16/20   [provider]  DULoxetine (CYMBALTA) 20 MG capsule Take one capsule daily for one week then  increase to one capsule twice daily 10/24/23   [provider]  ferrous gluconate  (FERGON) 324 MG tablet Take 1 tablet (324 mg total) by mouth daily with breakfast. 05/13/23   Tysinger, Alm RAMAN, PA-C  gabapentin  (NEURONTIN ) 600 MG tablet Take 600 mg by mouth 2 (two) times daily.    [provider]  isosorbide  mononitrate (IMDUR ) 120 MG 24 hr tablet Take 1 tablet (120 mg total) by mouth daily. 02/21/23   Cash Lonni JONETTA, MD  latanoprost  (XALATAN ) 0.005 % ophthalmic solution Place 1 drop into both eyes at bedtime. 02/28/20   [provider]  nitroGLYCERIN  (NITROSTAT ) 0.4 MG SL tablet Place 1 tablet (0.4 mg total) under the tongue every 5 (five) minutes as needed for chest pain (max 3 doses). 02/19/22   Cash Lonni JONETTA, MD  omeprazole (PRILOSEC) 20  MG capsule Take 20 mg by mouth daily.    [provider]  polyethylene glycol powder (GLYCOLAX /MIRALAX ) 17 GM/SCOOP powder Take 17 g by mouth daily. Patient not taking: Reported on 12/08/2023 05/13/23   Bulah Alm RAMAN, PA-C  pravastatin  (PRAVACHOL ) 40 MG tablet TAKE 1 TABLET EVERY EVENING 11/23/23   McAlhany, Christopher D, MD  tamsulosin  (FLOMAX ) 0.4 MG CAPS capsule Take 0.4 mg by mouth every evening.    [provider]  traMADol  (ULTRAM ) 50 MG tablet Take 1 tablet (50 mg total) by mouth every 12 (twelve) hours as needed. 08/25/23   Tegeler, Lonni PARAS, MD    Family History Family History  Problem Relation Age of Onset   Breast cancer Mother        dx 8s   Hyperlipidemia Mother    Hypertension Mother    COPD Father        deceased   Hyperlipidemia Father    Hypertension Father    Hypertension Sister    Cancer Brother 10       Unknown cancer, possibly stomach   Hypertension Brother    Cancer Brother 76       Unknown, possibly lung   Hypertension Daughter    Hypertension Son    Colon cancer Neg Hx        pt is unclear about family hx    Social History Social History   Tobacco  Use   Smoking status: Former    Current packs/day: 0.00    Average packs/day: 0.5 packs/day for 52.0 years (26.0 ttl pk-yrs)    Types: Cigarettes    Start date: 05/26/1959    Quit date: 05/26/2011    Years since quitting: 12.5    Passive exposure: Past   Smokeless tobacco: Never  Vaping Use   Vaping status: Never Used  Substance Use Topics   Alcohol use: No   Drug use: No     Allergies   Topiramate, Atenolol , Lisinopril, and Tramadol -acetaminophen    Review of Systems Review of Systems  Constitutional:  Negative for fever.  HENT:  Positive for rhinorrhea (with coughing episodes). Negative for congestion, sneezing and sore throat.   Respiratory:  Positive for cough. Negative for wheezing and stridor.   Gastrointestinal:  Negative for diarrhea, nausea and vomiting.  Neurological:  Positive for headaches.  All other systems reviewed and are negative.    Physical Exam Triage Vital Signs ED Triage Vitals [12/08/23 1322]  Encounter Vitals Group     BP 138/70     Girls Systolic BP Percentile      Girls Diastolic BP Percentile      Boys Systolic BP Percentile      Boys Diastolic BP Percentile      Pulse Rate 61     Resp 16     Temp 98.3 F (36.8 C)     Temp Source Oral     SpO2 95 %     Weight      Height      Head Circumference      Peak Flow      Pain Score 0     Pain Loc      Pain Education      Exclude from Growth Chart    No data found.  Updated Vital Signs BP 138/70 (BP Location: Left Arm)   Pulse 61   Temp 98.3 F (36.8 C) (Oral)   Resp 16   SpO2 95%   Visual Acuity Right Eye Distance:  Left Eye Distance:   Bilateral Distance:    Right Eye Near:   Left Eye Near:    Bilateral Near:     Physical Exam   UC Treatments / Results  Labs (all labs ordered are listed, but only abnormal results are displayed) Labs Reviewed - No data to display  EKG   Radiology DG Chest 2 View Result Date: 12/08/2023 CLINICAL DATA:  Cough for 2 weeks. History  of chronic cough. Now productive cough and congestion. EXAM: CHEST - 2 VIEW COMPARISON:  08/25/2023 FINDINGS: Cardiac pacemaker. Heart size and pulmonary vascularity are normal. Lungs are clear. No pleural effusion or pneumothorax. Mediastinal contours appear intact. Calcification of the aorta. Degenerative changes in the spine and shoulders. IMPRESSION: No active cardiopulmonary disease. Electronically Signed   By: Elsie Gravely M.D.   On: 12/08/2023 15:34    Procedures Procedures (including critical care time)  Medications Ordered in UC Medications - No data to display  Initial Impression / Assessment and Plan / UC Course  I have reviewed the triage vital signs and the nursing notes.  Pertinent labs & imaging results that were available during my care of the patient were reviewed by me and considered in my medical decision making (see chart for details).     Patient presents with a persistent cough for two weeks associated with headaches from coughing but without sputum production, shortness of breath, wheezing, fever, nausea, vomiting, or diarrhea. History includes prior pulmonology evaluation with PFTs ruling out COPD and consideration of acid reflux as a contributing factor. Symptoms have not improved with increased omeprazole or prior use of Tessalon  Perles, Advair, or Brextra. Chest x-ray from May was negative, and repeat chest x-ray today again shows no acute abnormalities. Given ongoing symptoms, patient was advised to continue omeprazole, use albuterol  inhaler or nebulizer if wheezing or shortness of breath develops, and may continue Tessalon  Perles as needed. A short course of steroids and antibiotics was prescribed for possible underlying bacterial component along with Mucinex  DM twice daily and supportive care. Patient was instructed to follow up with pulmonology or primary care if symptoms fail to improve after treatment, and to seek emergency evaluation for worsening shortness of  breath, chest pain, hemoptysis, or other acute concerns.  Today's evaluation has revealed no signs of a dangerous process. Discussed diagnosis with patient and/or guardian. Patient and/or guardian aware of their diagnosis, possible red flag symptoms to watch out for and need for close follow up. Patient and/or guardian understands verbal and written discharge instructions. Patient and/or guardian comfortable with plan and disposition.  Patient and/or guardian has a clear mental status at this time, good insight into illness (after discussion and teaching) and has clear judgment to make decisions regarding their care  Documentation was completed with the aid of voice recognition software. Transcription may contain typographical errors. Final Clinical Impressions(s) / UC Diagnoses   Final diagnoses:  Cough, unspecified type     Discharge Instructions      You were seen today for a cough that has been ongoing for about two weeks. Your chest x-ray today did not show any signs of pneumonia or other serious lung problems. This type of cough can be caused by several factors, including irritation from acid reflux, airway inflammation, or a lingering infection.  You have been prescribed a short course of steroids and antibiotics to help with possible inflammation and infection. Please take the medications exactly as directed and complete the full course. You may continue taking Tessalon  Perles  as needed for cough relief and use Mucinex  DM twice daily to help thin mucus and make coughing easier. If you develop wheezing or shortness of breath, you may use your albuterol  inhaler or nebulizer as prescribed. Continue your omeprazole daily to manage acid reflux, which may also contribute to your cough.  At home, drink plenty of fluids to stay hydrated, rest as needed, and consider using a humidifier to keep the air moist. Warm fluids such as tea with honey may also help soothe your throat and reduce coughing.  Avoid exposure to secondhand smoke, or other irritants that can make coughing worse.  Please follow up with your primary care provider or pulmonologist if your symptoms do not improve after finishing the prescribed treatment.  Go to the emergency department right away if you experience worsening shortness of breath, chest pain, coughing up blood, high fever, confusion, or if you feel that you are unable to breathe comfortably.     ED Prescriptions     Medication Sig Dispense Auth. Provider   amoxicillin  (AMOXIL ) 875 MG tablet Take 1 tablet (875 mg total) by mouth 2 (two) times daily at 8 am and 6 pm for 7 days. 14 tablet Jenille Laszlo, [place], FNP   Dextromethorphan -guaiFENesin  (MUCINEX  DM MAXIMUM STRENGTH) 60-1200 MG TB12 Take 1 tablet by mouth 2 (two) times daily. 20 tablet Iola Lukes, FNP   methylPREDNISolone  (MEDROL  DOSEPAK) 4 MG TBPK tablet Take as directed 21 tablet Iola Lukes, FNP      PDMP not reviewed this encounter.   Iola Lukes, [place] 12/08/23 [phone]

## 2023-12-09 ENCOUNTER — Ambulatory Visit (INDEPENDENT_AMBULATORY_CARE_PROVIDER_SITE_OTHER): Payer: Medicare HMO

## 2023-12-09 DIAGNOSIS — I442 Atrioventricular block, complete: Secondary | ICD-10-CM | POA: Diagnosis not present

## 2023-12-10 LAB — CUP PACEART REMOTE DEVICE CHECK
Battery Remaining Longevity: 73 mo
Battery Remaining Percentage: 77 %
Battery Voltage: 2.99 V
Brady Statistic AP VP Percent: 81 %
Brady Statistic AP VS Percent: 1 %
Brady Statistic AS VP Percent: 18 %
Brady Statistic AS VS Percent: 1 %
Brady Statistic RA Percent Paced: 81 %
Brady Statistic RV Percent Paced: 99 %
Date Time Interrogation Session: 20250829044543
Implantable Lead Connection Status: 753985
Implantable Lead Connection Status: 753985
Implantable Lead Implant Date: 20230525
Implantable Lead Implant Date: 20230525
Implantable Lead Location: 753859
Implantable Lead Location: 753860
Implantable Pulse Generator Implant Date: 20230525
Lead Channel Impedance Value: 430 Ohm
Lead Channel Impedance Value: 430 Ohm
Lead Channel Pacing Threshold Amplitude: 0.75 V
Lead Channel Pacing Threshold Amplitude: 0.75 V
Lead Channel Pacing Threshold Pulse Width: 0.5 ms
Lead Channel Pacing Threshold Pulse Width: 0.5 ms
Lead Channel Sensing Intrinsic Amplitude: 5 mV
Lead Channel Sensing Intrinsic Amplitude: 8.6 mV
Lead Channel Setting Pacing Amplitude: 2 V
Lead Channel Setting Pacing Amplitude: 2.5 V
Lead Channel Setting Pacing Pulse Width: 0.5 ms
Lead Channel Setting Sensing Sensitivity: 2.5 mV
Pulse Gen Model: 2272
Pulse Gen Serial Number: 8084442

## 2023-12-12 ENCOUNTER — Ambulatory Visit

## 2023-12-12 ENCOUNTER — Ambulatory Visit: Payer: Self-pay | Admitting: Internal Medicine

## 2023-12-19 NOTE — Progress Notes (Signed)
 Remote PPM Transmission

## 2023-12-22 ENCOUNTER — Telehealth: Payer: Self-pay

## 2023-12-22 NOTE — Telephone Encounter (Signed)
 Received a refill request for AZ&ME (Breztri ) can be fax to [facility]  to [phone]

## 2023-12-29 ENCOUNTER — Ambulatory Visit: Admitting: Emergency Medicine

## 2023-12-29 ENCOUNTER — Encounter: Payer: Self-pay | Admitting: Emergency Medicine

## 2023-12-29 VITALS — BP 128/62 | HR 60 | Ht 69.0 in | Wt 228.0 lb

## 2023-12-29 DIAGNOSIS — R053 Chronic cough: Secondary | ICD-10-CM | POA: Diagnosis not present

## 2023-12-29 DIAGNOSIS — G4734 Idiopathic sleep related nonobstructive alveolar hypoventilation: Secondary | ICD-10-CM

## 2023-12-29 DIAGNOSIS — K219 Gastro-esophageal reflux disease without esophagitis: Secondary | ICD-10-CM

## 2023-12-29 NOTE — Assessment & Plan Note (Signed)
Continue omeprazole twice daily.

## 2023-12-29 NOTE — Assessment & Plan Note (Signed)
 Chronic hypoxemic respiratory failure on BiPAP.  Continue same

## 2023-12-29 NOTE — Assessment & Plan Note (Signed)
 Awake cough syndrome without any evidence of obstruction on his PFT.  Reassured him about this.  It sounds like he had either flaring allergies or more likely URI that led to a flare of his cough.  He did benefit from antibiotics and prednisone .  He is also benefiting from Mucinex  DM.  Plan to continue controlling his GERD, add loratadine  to control his rhinitis.  He has albuterol  available to use if needed

## 2023-12-29 NOTE — Progress Notes (Signed)
 Subjective:    [name], male    DOB: 22-Sep-1940, 83 y.o.   MRN: [id number]  Cough   ROV 12/29/2023 --83 year old man with a history of COPD, obesity with OHS on BiPAP nightly, chronic cough in the setting of GERD and chronic rhinitis.  When I saw him originally I took him off scheduled ICS/BD in order to obtain pulmonary function testing, question whether it was contributing some to upper airway irritation.  We also increased his omeprazole to twice daily. He had onset of increase nasal congestion and URI sx about 3 weeks ago, then cough. He was seen in UC and was treated with abx and prednisone . He still sings in chorus. Still uses albuterol  rarely. He is using mucinex  DM - does help him.   Pulmonary function testing 09/20/2023 reviewed by me shows grossly normal airflows without a bronchodilator response, restricted lung volumes, normal diffusion capacity, overall normal flow-volume loop    Review of Systems  Respiratory:  Positive for cough.    As per HPi  Past Medical History:  Diagnosis Date   Alpha thalassemia trait    Atypical chest pain    CAD (coronary artery disease)    a. Multiple prior evaluations then 05/2012 s/p BMS to OM2. b. Nuc 11/2012 wnl. c. Cath 2015: patent stent, moderate LAD/RCA dz, treated medically.   Cancer [facility])    Cataract    CHEST PAIN UNSPECIFIED 09/13/2008   Qualifier: Diagnosis of  By: Buell, RN, Heather     CKD (chronic kidney disease), stage II    COPD (chronic obstructive pulmonary disease) (HCC)    Cough secondary to angiotensin converting enzyme inhibitor (ACE-I)    Diabetes mellitus (HCC) 09/07/2019   Diabetes mellitus without complication (HCC)    Diverticulosis    DVT (deep venous thrombosis) (HCC)    Family history of breast cancer in first degree relative    GERD (gastroesophageal reflux disease)    Headache(784.0)    Hx: of   History of blood transfusion    I've had 2; don't know what it was related to  (11/09/2012)   HLD (hyperlipidemia)    HTN (hypertension)    Iron deficiency anemia    Lymphoproliferative disorder, low grade B cell (HCC) 05/21/2020   Microcytic anemia    PAF (paroxysmal atrial fibrillation) (HCC)    Peripheral vascular disease (HCC)    a. L pop-tibial bypass 2011, followed by [facility].   Personal history of prostate cancer    Presence of permanent cardiac pacemaker    Rotator cuff injury    left arm; never repaired (11/09/2012)     Family History  Problem Relation Age of Onset   Breast cancer Mother        dx 66s   Hyperlipidemia Mother    Hypertension Mother    COPD Father        deceased   Hyperlipidemia Father    Hypertension Father    Hypertension Sister    Cancer Brother 10       Unknown cancer, possibly stomach   Hypertension Brother    Cancer Brother 81       Unknown, possibly lung   Hypertension Daughter    Hypertension Son    Colon cancer Neg Hx        pt is unclear about family hx     Social History   Socioeconomic History   Marital status: Widowed    Spouse name: Not on file   Number of  children: Not on file   Years of education: Not on file   Highest education level: 12th grade  Occupational History   Occupation: Retired  Tobacco Use   Smoking status: Former    Current packs/day: 0.00    Average packs/day: 0.5 packs/day for 52.0 years (26.0 ttl pk-yrs)    Types: Cigarettes    Start date: 05/26/1959    Quit date: 05/26/2011    Years since quitting: 12.6    Passive exposure: Past   Smokeless tobacco: Never  Vaping Use   Vaping status: Never Used  Substance and Sexual Activity   Alcohol use: No   Drug use: No   Sexual activity: Not on file  Other Topics Concern   Not on file  Social History Narrative   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  Lives in [place] by himself.      Patient writes with his right hand, though uses his left hand for every thing else. He lives alone in a one level home. He drinks 3 cups of coffee a  day and 3 7-8 oz sodas a day. He does not exercise.   Social Drivers of Corporate investment banker Strain: Low Risk  (08/15/2023)   Received from [facility]   Overall Financial Resource Strain (CARDIA)    Difficulty of Paying Living Expenses: Not hard at all  Food Insecurity: No Food Insecurity (08/15/2023)   Received from [facility]   Hunger Vital Sign    Within the past 12 months, you worried that your food would run out before you got the money to buy more.: Never true    Within the past 12 months, the food you bought just didn't last and you didn't have money to get more.: Never true  Transportation Needs: No Transportation Needs (08/15/2023)   Received from [facility] - Transportation    Lack of Transportation (Medical): No    Lack of Transportation (Non-Medical): No  Physical Activity: Inactive (06/01/2022)   Exercise Vital Sign    Days of Exercise per Week: 0 days    Minutes of Exercise per Session: 0 min  Stress: No Stress Concern Present (06/01/2022)   [employer] [facility] [employer] [facility] - Occupational Stress Questionnaire    Feeling of Stress : Not at all  Social Connections: Unknown (08/21/2021)   Received from [facility]   Social Network    Social Network: Not on file  Intimate Partner Violence: Unknown (07/13/2021)   Received from [facility]   HITS    Physically Hurt: Not on file    Insult or Talk Down To: Not on file    Threaten Physical Harm: Not on file    Scream or Curse: Not on file     Allergies  Allergen Reactions   Topiramate Shortness Of Breath and Other (See Comments)     leg cramps   Atenolol  Other (See Comments)    bradycardia   Lisinopril Cough   Tramadol -Acetaminophen  Cough    Tolerates plain Tramadol      Outpatient Medications Prior to Visit  Medication Sig Dispense Refill   abiraterone  acetate (ZYTIGA ) 250 MG tablet Take 250 mg by mouth daily. On an empty stomach     Accu-Chek FastClix Lancets MISC Test once daily  102 each 12   albuterol  (VENTOLIN  HFA) 108 (90 Base) MCG/ACT inhaler Inhale 2 puffs into the lungs every 6 (six) hours as needed for wheezing or shortness of breath. 8 g 0   amLODipine  (  NORVASC ) 10 MG tablet TAKE 1 TABLET EVERY DAY 90 tablet 1   aspirin  EC 81 MG tablet Take 1 tablet (81 mg total) by mouth daily. Swallow whole. 90 tablet 3   brimonidine  (ALPHAGAN ) 0.2 % ophthalmic solution Place 1 drop into both eyes 2 (two) times daily.     Dextromethorphan -guaiFENesin  (MUCINEX  DM MAXIMUM STRENGTH) 60-1200 MG TB12 Take 1 tablet by mouth 2 (two) times daily. 20 tablet 0   diclofenac  Sodium (VOLTAREN ) 1 % GEL Apply 2 g topically 2 (two) times daily as needed (back pain).     DULoxetine (CYMBALTA) 20 MG capsule Take one capsule daily for one week then increase to one capsule twice daily     ferrous gluconate  (FERGON) 324 MG tablet Take 1 tablet (324 mg total) by mouth daily with breakfast. 30 tablet 2   gabapentin  (NEURONTIN ) 600 MG tablet Take 600 mg by mouth 2 (two) times daily.     isosorbide  mononitrate (IMDUR ) 120 MG 24 hr tablet Take 1 tablet (120 mg total) by mouth daily. 90 tablet 3   latanoprost  (XALATAN ) 0.005 % ophthalmic solution Place 1 drop into both eyes at bedtime.     nitroGLYCERIN  (NITROSTAT ) 0.4 MG SL tablet Place 1 tablet (0.4 mg total) under the tongue every 5 (five) minutes as needed for chest pain (max 3 doses). 75 tablet 2   omeprazole (PRILOSEC) 20 MG capsule Take 20 mg by mouth daily.     polyethylene glycol powder (GLYCOLAX /MIRALAX ) 17 GM/SCOOP powder Take 17 g by mouth daily. 765 g 2   pravastatin  (PRAVACHOL ) 40 MG tablet TAKE 1 TABLET EVERY EVENING 90 tablet 1   tamsulosin  (FLOMAX ) 0.4 MG CAPS capsule Take 0.4 mg by mouth every evening.     traMADol  (ULTRAM ) 50 MG tablet Take 1 tablet (50 mg total) by mouth every 12 (twelve) hours as needed. 15 tablet 0   methylPREDNISolone  (MEDROL  DOSEPAK) 4 MG TBPK tablet Take as directed 21 tablet 0   No facility-administered  medications prior to visit.        Objective:   Physical Exam Vitals:   12/29/23 1506  BP: 128/62  Pulse: 60  SpO2: 94%  Weight: 228 lb (103.4 kg)  Height: 5' 9 (1.753 m)   Gen: Pleasant, well-nourished, in no distress,  normal affect  ENT: No lesions,  mouth clear,  oropharynx clear, no postnasal drip  Neck: No JVD, no stridor  Lungs: No use of accessory muscles, no crackles or wheezing on normal respiration, no wheeze on forced expiration  Cardiovascular: RRR, heart sounds normal, no murmur or gallops, no peripheral edema  Musculoskeletal: No deformities, no cyanosis or clubbing  Neuro: alert, awake, non focal  Skin: Warm, no lesions or rash      Assessment & Plan:   Chronic cough Awake cough syndrome without any evidence of obstruction on his PFT.  Reassured him about this.  It sounds like he had either flaring allergies or more likely URI that led to a flare of his cough.  He did benefit from antibiotics and prednisone .  He is also benefiting from Mucinex  DM.  Plan to continue controlling his GERD, add loratadine  to control his rhinitis.  He has albuterol  available to use if needed  Sleep related hypoxia Chronic hypoxemic respiratory failure on BiPAP.  Continue same  GERD Continue omeprazole twice daily    Lamar Chris, MD, PhD 12/29/2023, 3:33 PM [facility] [phone] or if no answer before 7:00PM call [phone] For any issues after  7:00PM please call eLink [phone]

## 2023-12-29 NOTE — Patient Instructions (Signed)
 Keep your albuterol  available to use 2 puffs if needed for shortness of breath, chest tightness, spells of coughing. Continue your omeprazole twice a day Agree with Mucinex  DM as needed for cough and congestion Try starting loratadine  10 mg (generic Claritin ) once daily. Following in our office in 1 year, sooner if you have any problems.  Please call if you have flaring cough so we can make adjustments if necessary.

## 2024-01-02 ENCOUNTER — Other Ambulatory Visit: Payer: Self-pay | Admitting: Medical

## 2024-01-02 DIAGNOSIS — M542 Cervicalgia: Secondary | ICD-10-CM | POA: Diagnosis not present

## 2024-01-02 DIAGNOSIS — G518 Other disorders of facial nerve: Secondary | ICD-10-CM | POA: Diagnosis not present

## 2024-01-02 DIAGNOSIS — R519 Headache, unspecified: Secondary | ICD-10-CM | POA: Diagnosis not present

## 2024-01-02 DIAGNOSIS — M791 Myalgia, unspecified site: Secondary | ICD-10-CM | POA: Diagnosis not present

## 2024-01-02 DIAGNOSIS — Z79899 Other long term (current) drug therapy: Secondary | ICD-10-CM

## 2024-01-05 ENCOUNTER — Encounter (HOSPITAL_COMMUNITY): Payer: Self-pay | Admitting: Emergency Medicine

## 2024-01-05 ENCOUNTER — Ambulatory Visit (HOSPITAL_COMMUNITY)
Admission: EM | Admit: 2024-01-05 | Discharge: 2024-01-05 | Disposition: A | Discharge status: Home / Self Care | Attending: Family Medicine | Admitting: Family Medicine

## 2024-01-05 ENCOUNTER — Other Ambulatory Visit: Payer: Self-pay

## 2024-01-05 DIAGNOSIS — R059 Cough, unspecified: Secondary | ICD-10-CM | POA: Diagnosis not present

## 2024-01-05 MED ORDER — PROMETHAZINE-DM 6.25-15 MG/5ML PO SYRP: 5.0000 mL | ORAL_SOLUTION | Freq: Four times a day (QID) | ORAL | 0 refills | Status: DC | PRN

## 2024-01-05 MED ORDER — PREDNISONE 50 MG PO TABS
ORAL_TABLET | ORAL | 0 refills | Status: DC
Start: 2024-01-05 — End: 2024-02-19

## 2024-01-05 NOTE — ED Triage Notes (Signed)
 Cough for 3 weeks, reports clear phlegm, reports his nose runs, particularly after coughing spells.    Seen in August for similar symptoms.  Improved for 2 weeks and then symptoms returned  Has had mucinex -but not any particular help

## 2024-01-05 NOTE — ED Provider Notes (Signed)
 [facility]    CSN: [id number] Arrival date & time: 01/05/24  1504      History   Chief Complaint Chief Complaint  Patient presents with   Cough    HPI Joel [name] is a 83 y.o. male.   Pt presents today due to persistent cough for the past 3 weeks.  Patient states that he has coughing spells to the point that he is coughing up clear sputum.  Patient states that he had something similar about a month ago which she was seen here for.  Patient states that the medications that he was prescribed then were helpful after about 2 weeks and then states that his symptoms returned.  Patient states that he was seen by his pulmonologist on the 18th of this month.  Patient was given and a pulmonary function test and obstruction was ruled out.  Patient was advised to take an allergy medicine daily, use omeprazole twice a day, continue use of Mucinex  DM, and continued use of inhaler.  Patient has not been following this regimen completely.   Cough   Past Medical History:  Diagnosis Date   Alpha thalassemia trait    Atypical chest pain    CAD (coronary artery disease)    a. Multiple prior evaluations then 05/2012 s/p BMS to OM2. b. Nuc 11/2012 wnl. c. Cath 2015: patent stent, moderate LAD/RCA dz, treated medically.   Cancer [facility])    Cataract    CHEST PAIN UNSPECIFIED 09/13/2008   Qualifier: Diagnosis of  By: Buell, RN, Heather     CKD (chronic kidney disease), stage II    COPD (chronic obstructive pulmonary disease) (HCC)    Cough secondary to angiotensin converting enzyme inhibitor (ACE-I)    Diabetes mellitus (HCC) 09/07/2019   Diabetes mellitus without complication (HCC)    Diverticulosis    DVT (deep venous thrombosis) (HCC)    Family history of breast cancer in first degree relative    GERD (gastroesophageal reflux disease)    Headache(784.0)    Hx: of   History of blood transfusion    I've had 2; don't know what it was related to (11/09/2012)   HLD  (hyperlipidemia)    HTN (hypertension)    Iron deficiency anemia    Lymphoproliferative disorder, low grade B cell (HCC) 05/21/2020   Microcytic anemia    PAF (paroxysmal atrial fibrillation) (HCC)    Peripheral vascular disease    a. L pop-tibial bypass 2011, followed by [facility].   Personal history of prostate cancer    Presence of permanent cardiac pacemaker    Rotator cuff injury    left arm; never repaired (11/09/2012)    Patient Active Problem List   Diagnosis Date Noted   Aortic aneurysm 08/18/2023   Vitamin D  deficiency 05/20/2023   Vitamin deficiency 05/12/2023   Fatigue 05/12/2023   Does mobilize using cane 05/12/2023   Paroxysmal atrial fibrillation (HCC) 09/02/2022   Chronic constipation 06/09/2022   Pulmonary nodule 02/17/2022   Anemia 01/22/2022   Pacemaker 12/17/2021   Heart block 08/04/2021   AV block, Mobitz 1 06/26/2021   Constipation, unspecified 05/11/2021   Non-Hodgkin lymphoma, unspecified, unspecified site (HCC) 05/11/2021   Encounter for therapeutic drug level monitoring 05/11/2021   Alpha thalassaemia minor 12/22/2020   Benign hypertensive heart disease 12/22/2020   Contact with and (suspected) exposure to other environmental pollution 12/22/2020   Aortic atherosclerosis 07/17/2020   Aneurysm of iliac artery 06/13/2020   Lymphoproliferative disorder, low grade B cell (HCC)-Stage 4  05/21/2020   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   Carcinoma of prostate (HCC) 09/06/2019   Sleep related hypoxia 04/03/2019   Obesity (BMI 30-39.9) 04/03/2019   Snoring 04/03/2019   At risk for sleep apnea 01/10/2019   Daytime somnolence 01/10/2019   Controlled type 2 diabetes mellitus with hyperglycemia, without long-term current use of insulin  (HCC) 12/11/2018   Hypoxic encephalopathy (HCC) 12/11/2018   Obesity hypoventilation syndrome (HCC) 12/11/2018   Chronic low back pain 09/11/2018   Seasonal allergies 07/04/2018   Pulsatile  tinnitus 02/17/2018   Dizziness 01/20/2018   Malaise 01/20/2018   Decreased breath sounds 01/20/2018   Needs flu shot 01/20/2018   Chronic nonintractable headache 12/07/2017   Back pain 11/01/2017   Radicular pain of lower extremity 11/01/2017   Prostate cancer (HCC) 11/01/2017   Family history of breast cancer in first degree relative    Adenomatous polyp of colon 04/12/2017   Elliptocytosis on peripheral blood smear 09/13/2016   PVD (peripheral vascular disease) with claudication 11/15/2012   Hypokalemia 11/10/2012   Bradycardia 11/09/2012   Thrombocytopenia 06/01/2012   Microcytic anemia    Atherosclerosis of native artery of extremity with intermittent claudication 06/16/2011   Stable angina 06/11/2011   CAD in native artery    Chest pain, atypical 05/30/2011   Iron deficiency anemia 10/03/2009   Diverticulosis of colon 10/03/2009   Transient cerebral ischemia 06/05/2008   SHOULDER PAIN, LEFT 10/11/2007   Chronic cough 05/07/2007   Hyperlipidemia 04/21/2007   Essential hypertension 04/21/2007   GERD 04/21/2007    Past Surgical History:  Procedure Laterality Date   CARDIOVASCULAR STRESS TEST  Aug. 2014   CATARACT EXTRACTION W/ INTRAOCULAR LENS IMPLANT Right ~ 2008   CIRCUMCISION     COLONOSCOPY     CORONARY ANGIOPLASTY WITH STENT PLACEMENT  2014   1 (11/09/2012)   ILIAC ARTERY ANEURYSM REPAIR     LEFT ATRIAL APPENDAGE OCCLUSION N/A 09/02/2022   Procedure: LEFT ATRIAL APPENDAGE OCCLUSION;  Surgeon: Cindie Ole DASEN, MD;  [facility]: [facility];  Service: Cardiovascular;  Laterality: N/A;   LEFT HEART CATH  05-31-12   LEFT HEART CATH AND CORONARY ANGIOGRAPHY N/A 08/01/2019   Procedure: LEFT HEART CATH AND CORONARY ANGIOGRAPHY;  Surgeon: Verlin Lonni BIRCH, MD;  [facility]: [facility];  Service: Cardiovascular;  Laterality: N/A;   LEFT HEART CATHETERIZATION WITH CORONARY ANGIOGRAM N/A 06/01/2011   Procedure: LEFT HEART CATHETERIZATION WITH CORONARY  ANGIOGRAM;  Surgeon: Debby BIRCH Como, MD;  Location: [facility] CATH LAB;  Service: Cardiovascular;  Laterality: N/A;   LEFT HEART CATHETERIZATION WITH CORONARY ANGIOGRAM N/A 05/31/2012   Procedure: LEFT HEART CATHETERIZATION WITH CORONARY ANGIOGRAM;  Surgeon: Peter M Swaziland, MD;  Location: [facility] CATH LAB;  Service: Cardiovascular;  Laterality: N/A;   LEFT HEART CATHETERIZATION WITH CORONARY ANGIOGRAM N/A 08/01/2013   Procedure: LEFT HEART CATHETERIZATION WITH CORONARY ANGIOGRAM;  Surgeon: Lonni BIRCH Verlin, MD;  Location: [facility] CATH LAB;  Service: Cardiovascular;  Laterality: N/A;   PACEMAKER IMPLANT N/A 09/03/2021   Procedure: PACEMAKER IMPLANT;  Surgeon: Waddell Danelle ORN, MD;  [facility]: [facility];  Service: Cardiovascular;  Laterality: N/A;   PERCUTANEOUS CORONARY STENT INTERVENTION (PCI-S)  05/31/2012   Procedure: PERCUTANEOUS CORONARY STENT INTERVENTION (PCI-S);  Surgeon: Peter M Swaziland, MD;  Location: [facility] CATH LAB;  Service: Cardiovascular;;   PR VEIN BYPASS GRAFT,AORTO-FEM-POP Left 10/01/2009   left below knee popliteal artery to posterior tibial artery   SHOULDER ARTHROSCOPY WITH OPEN ROTATOR CUFF REPAIR AND  DISTAL CLAVICLE ACROMINECTOMY Left 01/12/2013   Procedure: LEFT SHOULDER ARTHROSCOPY WITH DEBRIDEMENT OPEN DISTAL CLAVICLE RESECTION ,acromioplastyAND ROTATOR CUFF REPAIR;  Surgeon: LELON JONETTA Shari Mickey., MD;  Location: [facility];  Service: Orthopedics;  Laterality: Left;   TEE WITHOUT CARDIOVERSION N/A 09/02/2022   Procedure: TRANSESOPHAGEAL ECHOCARDIOGRAM;  Surgeon: Cindie Ole DASEN, MD;  Location: [facility] INVASIVE CV LAB;  Service: Cardiovascular;  Laterality: N/A;       Home Medications    Prior to Admission medications   Medication Sig Start Date End Date Taking? Authorizing Provider  predniSONE  (DELTASONE ) 50 MG tablet Take 1 tab po daily 01/05/24  Yes Andra Krabbe C, PA-C  promethazine -dextromethorphan  (PROMETHAZINE -DM) 6.25-15 MG/5ML syrup Take 5 mLs by mouth 4 (four) times daily as  needed for cough. 01/05/24  Yes Andra Krabbe C, PA-C  abiraterone  acetate (ZYTIGA ) 250 MG tablet Take 250 mg by mouth daily. On an empty stomach 02/17/18   [provider]  Accu-Chek FastClix Lancets MISC Test once daily 11/23/18   Henson, Vickie L, NP-C  albuterol  (VENTOLIN  HFA) 108 (90 Base) MCG/ACT inhaler INHALE 2 PUFFS INTO THE LUNGS EVERY 6 HOURS AS NEEDED FOR WHEEZING OR SHORTNESS OF BREATH. 01/02/24   Tysinger, Alm RAMAN, PA-C  amLODipine  (NORVASC ) 10 MG tablet TAKE 1 TABLET EVERY DAY 11/23/23   Verlin Lonni JONETTA, MD  aspirin  EC 81 MG tablet Take 1 tablet (81 mg total) by mouth daily. Swallow whole. 03/17/22   Verlin Lonni JONETTA, MD  brimonidine  (ALPHAGAN ) 0.2 % ophthalmic solution Place 1 drop into both eyes 2 (two) times daily. 05/09/20   [provider]  Dextromethorphan -guaiFENesin  (MUCINEX  DM MAXIMUM STRENGTH) 60-1200 MG TB12 Take 1 tablet by mouth 2 (two) times daily. 12/08/23   Iola Lukes, FNP  diclofenac  Sodium (VOLTAREN ) 1 % GEL Apply 2 g topically 2 (two) times daily as needed (back pain). 06/16/20   [provider]  DULoxetine (CYMBALTA) 20 MG capsule Take one capsule daily for one week then increase to one capsule twice daily 10/24/23   [provider]  ferrous gluconate  (FERGON) 324 MG tablet Take 1 tablet (324 mg total) by mouth daily with breakfast. 05/13/23   Tysinger, Alm RAMAN, PA-C  gabapentin  (NEURONTIN ) 600 MG tablet Take 600 mg by mouth 2 (two) times daily.    [provider]  isosorbide  mononitrate (IMDUR ) 120 MG 24 hr tablet Take 1 tablet (120 mg total) by mouth daily. 02/21/23   Verlin Lonni JONETTA, MD  latanoprost  (XALATAN ) 0.005 % ophthalmic solution Place 1 drop into both eyes at bedtime. 02/28/20   [provider]  nitroGLYCERIN  (NITROSTAT ) 0.4 MG SL tablet Place 1 tablet (0.4 mg total) under the tongue every 5 (five) minutes as needed for chest pain (max 3 doses). 02/19/22   Verlin Lonni JONETTA,  MD  omeprazole (PRILOSEC) 20 MG capsule Take 20 mg by mouth daily.    [provider]  polyethylene glycol powder (GLYCOLAX /MIRALAX ) 17 GM/SCOOP powder Take 17 g by mouth daily. 05/13/23   Tysinger, Alm RAMAN, PA-C  pravastatin  (PRAVACHOL ) 40 MG tablet TAKE 1 TABLET EVERY EVENING 11/23/23   Verlin Lonni JONETTA, MD  tamsulosin  (FLOMAX ) 0.4 MG CAPS capsule Take 0.4 mg by mouth every evening.    [provider]  traMADol  (ULTRAM ) 50 MG tablet Take 1 tablet (50 mg total) by mouth every 12 (twelve) hours as needed. 08/25/23   Tegeler, Lonni PARAS, MD    Family History Family History  Problem Relation Age of Onset   Breast cancer Mother  dx 40s   Hyperlipidemia Mother    Hypertension Mother    COPD Father        deceased   Hyperlipidemia Father    Hypertension Father    Hypertension Sister    Cancer Brother 92       Unknown cancer, possibly stomach   Hypertension Brother    Cancer Brother 51       Unknown, possibly lung   Hypertension Daughter    Hypertension Son    Colon cancer Neg Hx        pt is unclear about family hx    Social History Social History   Tobacco Use   Smoking status: Former    Current packs/day: 0.00    Average packs/day: 0.5 packs/day for 52.0 years (26.0 ttl pk-yrs)    Types: Cigarettes    Start date: 05/26/1959    Quit date: 05/26/2011    Years since quitting: 12.6    Passive exposure: Past   Smokeless tobacco: Never  Vaping Use   Vaping status: Never Used  Substance Use Topics   Alcohol use: No   Drug use: No     Allergies   Topiramate, Atenolol , Lisinopril, and Tramadol -acetaminophen    Review of Systems Review of Systems  Respiratory:  Positive for cough.      Physical Exam Triage Vital Signs ED Triage Vitals  Encounter Vitals Group     BP 01/05/24 1544 (!) 134/59     Girls Systolic BP Percentile --      Girls Diastolic BP Percentile --      Boys Systolic BP Percentile --      Boys Diastolic BP Percentile  --      Pulse Rate 01/05/24 1544 65     Resp 01/05/24 1544 20     Temp 01/05/24 1544 98.1 F (36.7 C)     Temp Source 01/05/24 1544 Oral     SpO2 01/05/24 1544 96 %     Weight --      Height --      Head Circumference --      Peak Flow --      Pain Score 01/05/24 1540 8     Pain Loc --      Pain Education --      Exclude from Growth Chart --    No data found.  Updated Vital Signs BP (!) 134/59 (BP Location: Right Arm)   Pulse 65   Temp 98.1 F (36.7 C) (Oral)   Resp 20   SpO2 96%   Visual Acuity Right Eye Distance:   Left Eye Distance:   Bilateral Distance:    Right Eye Near:   Left Eye Near:    Bilateral Near:     Physical Exam Vitals and nursing note reviewed.  Constitutional:      General: He is not in acute distress.    Appearance: Normal appearance. He is not ill-appearing, toxic-appearing or diaphoretic.  Eyes:     General: No scleral icterus. Cardiovascular:     Rate and Rhythm: Normal rate and regular rhythm.     Heart sounds: Normal heart sounds.  Pulmonary:     Effort: Pulmonary effort is normal. No respiratory distress.     Breath sounds: Normal breath sounds. No wheezing or rhonchi.  Skin:    General: Skin is warm.  Neurological:     Mental Status: He is alert and oriented to person, place, and time.  Psychiatric:  Mood and Affect: Mood normal.        Behavior: Behavior normal.      UC Treatments / Results  Labs (all labs ordered are listed, but only abnormal results are displayed) Labs Reviewed - No data to display  EKG   Radiology No results found.  Procedures Procedures (including critical care time)  Medications Ordered in UC Medications - No data to display  Initial Impression / Assessment and Plan / UC Course  I have reviewed the triage vital signs and the nursing notes.  Pertinent labs & imaging results that were available during my care of the patient were reviewed by me and considered in my medical decision  making (see chart for details).     Cough- will prescribed oral steroids and promethazine -DM, advised follow-up with pulmonology if symptoms do not improve Final Clinical Impressions(s) / UC Diagnoses   Final diagnoses:  Cough, unspecified type   Discharge Instructions   None    ED Prescriptions     Medication Sig Dispense Auth. Provider   predniSONE  (DELTASONE ) 50 MG tablet Take 1 tab po daily 5 tablet Andra Krabbe C, PA-C   promethazine -dextromethorphan  (PROMETHAZINE -DM) 6.25-15 MG/5ML syrup Take 5 mLs by mouth 4 (four) times daily as needed for cough. 118 mL Andra Krabbe BROCKS, PA-C      PDMP not reviewed this encounter.   Andra Krabbe BROCKS, PA-C 01/05/24 1654

## 2024-01-09 ENCOUNTER — Other Ambulatory Visit: Payer: Self-pay | Admitting: Medical

## 2024-01-09 DIAGNOSIS — Z79899 Other long term (current) drug therapy: Secondary | ICD-10-CM

## 2024-01-11 ENCOUNTER — Ambulatory Visit (HOSPITAL_COMMUNITY): Attending: Vascular Surgery

## 2024-01-11 ENCOUNTER — Ambulatory Visit (HOSPITAL_COMMUNITY): Admission: RE | Admit: 2024-01-11 | Source: Ambulatory Visit

## 2024-01-11 ENCOUNTER — Ambulatory Visit: Admitting: Vascular Surgery

## 2024-01-11 ENCOUNTER — Ambulatory Visit (HOSPITAL_COMMUNITY): Admission: RE | Admit: 2024-01-11

## 2024-01-16 ENCOUNTER — Ambulatory Visit (INDEPENDENT_AMBULATORY_CARE_PROVIDER_SITE_OTHER)

## 2024-01-16 DIAGNOSIS — Z23 Encounter for immunization: Secondary | ICD-10-CM | POA: Diagnosis not present

## 2024-01-20 ENCOUNTER — Ambulatory Visit (INDEPENDENT_AMBULATORY_CARE_PROVIDER_SITE_OTHER)

## 2024-01-20 ENCOUNTER — Ambulatory Visit (HOSPITAL_COMMUNITY)
Admission: EM | Admit: 2024-01-20 | Discharge: 2024-01-20 | Disposition: A | Discharge status: Home / Self Care | Attending: Family Medicine | Admitting: Family Medicine

## 2024-01-20 ENCOUNTER — Encounter (HOSPITAL_COMMUNITY): Payer: Self-pay

## 2024-01-20 DIAGNOSIS — Z950 Presence of cardiac pacemaker: Secondary | ICD-10-CM | POA: Diagnosis not present

## 2024-01-20 DIAGNOSIS — R051 Acute cough: Secondary | ICD-10-CM | POA: Diagnosis not present

## 2024-01-20 DIAGNOSIS — R059 Cough, unspecified: Secondary | ICD-10-CM | POA: Diagnosis not present

## 2024-01-20 MED ORDER — BENZONATATE 100 MG PO CAPS: 100.0000 mg | ORAL_CAPSULE | Freq: Three times a day (TID) | ORAL | 0 refills | Status: DC

## 2024-01-20 NOTE — ED Provider Notes (Signed)
 [facility]    CSN: [id number] Arrival date & time: 01/20/24  1306      History   Chief Complaint Chief Complaint  Patient presents with   Cough    HPI Joel [name] is a 83 y.o. male.   Pt returns after being seen by this provider on 9/25 for cough stating that his cough has not improved. Pt denies fever, shortness of breath, change in appetite, nausea, vomiting, dizziness, or fatigue. Pt states that he is taking all meds as prescribed.    Cough   Past Medical History:  Diagnosis Date   Alpha thalassemia trait    Atypical chest pain    CAD (coronary artery disease)    a. Multiple prior evaluations then 05/2012 s/p BMS to OM2. b. Nuc 11/2012 wnl. c. Cath 2015: patent stent, moderate LAD/RCA dz, treated medically.   Cancer [facility])    Cataract    CHEST PAIN UNSPECIFIED 09/13/2008   Qualifier: Diagnosis of  By: Buell, RN, Heather     CKD (chronic kidney disease), stage II    COPD (chronic obstructive pulmonary disease) (HCC)    Cough secondary to angiotensin converting enzyme inhibitor (ACE-I)    Diabetes mellitus (HCC) 09/07/2019   Diabetes mellitus without complication (HCC)    Diverticulosis    DVT (deep venous thrombosis) (HCC)    Family history of breast cancer in first degree relative    GERD (gastroesophageal reflux disease)    Headache(784.0)    Hx: of   History of blood transfusion    I've had 2; don't know what it was related to (11/09/2012)   HLD (hyperlipidemia)    HTN (hypertension)    Iron deficiency anemia    Lymphoproliferative disorder, low grade B cell (HCC) 05/21/2020   Microcytic anemia    PAF (paroxysmal atrial fibrillation) (HCC)    Peripheral vascular disease    a. L pop-tibial bypass 2011, followed by [facility].   Personal history of prostate cancer    Presence of permanent cardiac pacemaker    Rotator cuff injury    left arm; never repaired (11/09/2012)    Patient Active Problem List   Diagnosis Date Noted   Acute cough  01/20/2024   Aortic aneurysm 08/18/2023   Vitamin D  deficiency 05/20/2023   Vitamin deficiency 05/12/2023   Fatigue 05/12/2023   Does mobilize using cane 05/12/2023   Paroxysmal atrial fibrillation (HCC) 09/02/2022   Chronic constipation 06/09/2022   Pulmonary nodule 02/17/2022   Anemia 01/22/2022   Pacemaker 12/17/2021   Heart block 08/04/2021   AV block, Mobitz 1 06/26/2021   Constipation, unspecified 05/11/2021   Non-Hodgkin lymphoma, unspecified, unspecified site (HCC) 05/11/2021   Encounter for therapeutic drug level monitoring 05/11/2021   Alpha thalassaemia minor 12/22/2020   Benign hypertensive heart disease 12/22/2020   Contact with and (suspected) exposure to other environmental pollution 12/22/2020   Aortic atherosclerosis 07/17/2020   Aneurysm of iliac artery 06/13/2020   Lymphoproliferative disorder, low grade B cell (HCC)-Stage 4 05/21/2020   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   Carcinoma of prostate (HCC) 09/06/2019   Sleep related hypoxia 04/03/2019   Obesity (BMI 30-39.9) 04/03/2019   Snoring 04/03/2019   At risk for sleep apnea 01/10/2019   Daytime somnolence 01/10/2019   Controlled type 2 diabetes mellitus with hyperglycemia, without long-term current use of insulin  (HCC) 12/11/2018   Hypoxic encephalopathy (HCC) 12/11/2018   Obesity hypoventilation syndrome (HCC) 12/11/2018   Chronic low back pain 09/11/2018  Seasonal allergies 07/04/2018   Pulsatile tinnitus 02/17/2018   Dizziness 01/20/2018   Malaise 01/20/2018   Decreased breath sounds 01/20/2018   Needs flu shot 01/20/2018   Chronic nonintractable headache 12/07/2017   Back pain 11/01/2017   Radicular pain of lower extremity 11/01/2017   Prostate cancer (HCC) 11/01/2017   Family history of breast cancer in first degree relative    Adenomatous polyp of colon 04/12/2017   Elliptocytosis on peripheral blood smear 09/13/2016   PVD (peripheral vascular disease)  with claudication 11/15/2012   Hypokalemia 11/10/2012   Bradycardia 11/09/2012   Thrombocytopenia 06/01/2012   Microcytic anemia    Atherosclerosis of native artery of extremity with intermittent claudication 06/16/2011   Stable angina 06/11/2011   CAD in native artery    Chest pain, atypical 05/30/2011   Iron deficiency anemia 10/03/2009   Diverticulosis of colon 10/03/2009   Transient cerebral ischemia 06/05/2008   SHOULDER PAIN, LEFT 10/11/2007   Chronic cough 05/07/2007   Hyperlipidemia 04/21/2007   Essential hypertension 04/21/2007   GERD 04/21/2007    Past Surgical History:  Procedure Laterality Date   CARDIOVASCULAR STRESS TEST  Aug. 2014   CATARACT EXTRACTION W/ INTRAOCULAR LENS IMPLANT Right ~ 2008   CIRCUMCISION     COLONOSCOPY     CORONARY ANGIOPLASTY WITH STENT PLACEMENT  2014   1 (11/09/2012)   ILIAC ARTERY ANEURYSM REPAIR     LEFT ATRIAL APPENDAGE OCCLUSION N/A 09/02/2022   Procedure: LEFT ATRIAL APPENDAGE OCCLUSION;  Surgeon: Cindie Ole DASEN, MD;  [facility];  Service: Cardiovascular;  Laterality: N/A;   LEFT HEART CATH  05-31-12   LEFT HEART CATH AND CORONARY ANGIOGRAPHY N/A 08/01/2019   Procedure: LEFT HEART CATH AND CORONARY ANGIOGRAPHY;  Surgeon: Verlin Lonni BIRCH, MD;  [facility]: [facility];  Service: Cardiovascular;  Laterality: N/A;   LEFT HEART CATHETERIZATION WITH CORONARY ANGIOGRAM N/A 06/01/2011   Procedure: LEFT HEART CATHETERIZATION WITH CORONARY ANGIOGRAM;  Surgeon: Debby BIRCH Como, MD;  Location: [facility] CATH LAB;  Service: Cardiovascular;  Laterality: N/A;   LEFT HEART CATHETERIZATION WITH CORONARY ANGIOGRAM N/A 05/31/2012   Procedure: LEFT HEART CATHETERIZATION WITH CORONARY ANGIOGRAM;  Surgeon: Peter M Swaziland, MD;  Location: [facility] CATH LAB;  Service: Cardiovascular;  Laterality: N/A;   LEFT HEART CATHETERIZATION WITH CORONARY ANGIOGRAM N/A 08/01/2013   Procedure: LEFT HEART CATHETERIZATION WITH CORONARY ANGIOGRAM;  Surgeon:  Lonni BIRCH Verlin, MD;  Location: [facility] CATH LAB;  Service: Cardiovascular;  Laterality: N/A;   PACEMAKER IMPLANT N/A 09/03/2021   Procedure: PACEMAKER IMPLANT;  Surgeon: Waddell Danelle ORN, MD;  [facility];  Service: Cardiovascular;  Laterality: N/A;   PERCUTANEOUS CORONARY STENT INTERVENTION (PCI-S)  05/31/2012   Procedure: PERCUTANEOUS CORONARY STENT INTERVENTION (PCI-S);  Surgeon: Peter M Swaziland, MD;  Location: [facility] CATH LAB;  Service: Cardiovascular;;   PR VEIN BYPASS GRAFT,AORTO-FEM-POP Left 10/01/2009   left below knee popliteal artery to posterior tibial artery   SHOULDER ARTHROSCOPY WITH OPEN ROTATOR CUFF REPAIR AND DISTAL CLAVICLE ACROMINECTOMY Left 01/12/2013   Procedure: LEFT SHOULDER ARTHROSCOPY WITH DEBRIDEMENT OPEN DISTAL CLAVICLE RESECTION ,acromioplastyAND ROTATOR CUFF REPAIR;  Surgeon: ORN BIRCH Shari Mickey., MD;  Location: [facility];  Service: Orthopedics;  Laterality: Left;   TEE WITHOUT CARDIOVERSION N/A 09/02/2022   Procedure: TRANSESOPHAGEAL ECHOCARDIOGRAM;  Surgeon: Cindie Ole DASEN, MD;  Location: [facility] INVASIVE CV LAB;  Service: Cardiovascular;  Laterality: N/A;       Home Medications    Prior to Admission medications   Medication  Sig Start Date End Date Taking? Authorizing Provider  abiraterone  acetate (ZYTIGA ) 250 MG tablet Take 250 mg by mouth daily. On an empty stomach 02/17/18  Yes [provider]  Accu-Chek FastClix Lancets MISC Test once daily 11/23/18  Yes Henson, Vickie L, NP-C  albuterol  (VENTOLIN  HFA) 108 (90 Base) MCG/ACT inhaler INHALE 2 PUFFS INTO THE LUNGS EVERY 6 (SIX) HOURS AS NEEDED FOR WHEEZING OR SHORTNESS OF BREATH. 01/09/24  Yes Tysinger, Alm RAMAN, PA-C  amLODipine  (NORVASC ) 10 MG tablet TAKE 1 TABLET EVERY DAY 11/23/23  Yes Verlin Lonni BIRCH, MD  aspirin  EC 81 MG tablet Take 1 tablet (81 mg total) by mouth daily. Swallow whole. 03/17/22  Yes Verlin Lonni BIRCH, MD  benzonatate  (TESSALON ) 100 MG capsule Take 1 capsule (100 mg total)  by mouth every 8 (eight) hours. 01/20/24  Yes Andra Krabbe C, PA-C  brimonidine  (ALPHAGAN ) 0.2 % ophthalmic solution Place 1 drop into both eyes 2 (two) times daily. 05/09/20  Yes [provider]  Dextromethorphan -guaiFENesin  (MUCINEX  DM MAXIMUM STRENGTH) 60-1200 MG TB12 Take 1 tablet by mouth 2 (two) times daily. 12/08/23  Yes Iola Lukes, FNP  diclofenac  Sodium (VOLTAREN ) 1 % GEL Apply 2 g topically 2 (two) times daily as needed (back pain). 06/16/20  Yes [provider]  DULoxetine (CYMBALTA) 20 MG capsule Take one capsule daily for one week then increase to one capsule twice daily 10/24/23  Yes [provider]  ferrous gluconate  (FERGON) 324 MG tablet Take 1 tablet (324 mg total) by mouth daily with breakfast. 05/13/23  Yes Tysinger, Alm RAMAN, PA-C  gabapentin  (NEURONTIN ) 600 MG tablet Take 600 mg by mouth 2 (two) times daily.   Yes [provider]  isosorbide  mononitrate (IMDUR ) 120 MG 24 hr tablet Take 1 tablet (120 mg total) by mouth daily. 02/21/23  Yes Verlin Lonni BIRCH, MD  latanoprost  (XALATAN ) 0.005 % ophthalmic solution Place 1 drop into both eyes at bedtime. 02/28/20  Yes [provider]  nitroGLYCERIN  (NITROSTAT ) 0.4 MG SL tablet Place 1 tablet (0.4 mg total) under the tongue every 5 (five) minutes as needed for chest pain (max 3 doses). 02/19/22  Yes Verlin Lonni BIRCH, MD  omeprazole (PRILOSEC) 20 MG capsule Take 20 mg by mouth daily.   Yes [provider]  polyethylene glycol powder (GLYCOLAX /MIRALAX ) 17 GM/SCOOP powder Take 17 g by mouth daily. 05/13/23  Yes Tysinger, Alm RAMAN, PA-C  pravastatin  (PRAVACHOL ) 40 MG tablet TAKE 1 TABLET EVERY EVENING 11/23/23  Yes Verlin Lonni BIRCH, MD  tamsulosin  (FLOMAX ) 0.4 MG CAPS capsule Take 0.4 mg by mouth every evening.   Yes [provider]  traMADol  (ULTRAM ) 50 MG tablet Take 1 tablet (50 mg total) by mouth every 12 (twelve) hours as needed. 08/25/23  Yes  Tegeler, Lonni PARAS, MD  predniSONE  (DELTASONE ) 50 MG tablet Take 1 tab po daily 01/05/24   Andra Krabbe BROCKS, PA-C  promethazine -dextromethorphan  (PROMETHAZINE -DM) 6.25-15 MG/5ML syrup Take 5 mLs by mouth 4 (four) times daily as needed for cough. 01/05/24   Andra Krabbe BROCKS, PA-C    Family History Family History  Problem Relation Age of Onset   Breast cancer Mother        dx 73s   Hyperlipidemia Mother    Hypertension Mother    COPD Father        deceased   Hyperlipidemia Father    Hypertension Father    Hypertension Sister    Cancer Brother 48       Unknown cancer, possibly  stomach   Hypertension Brother    Cancer Brother 45       Unknown, possibly lung   Hypertension Daughter    Hypertension Son    Colon cancer Neg Hx        pt is unclear about family hx    Social History Social History   Tobacco Use   Smoking status: Former    Current packs/day: 0.00    Average packs/day: 0.5 packs/day for 52.0 years (26.0 ttl pk-yrs)    Types: Cigarettes    Start date: 05/26/1959    Quit date: 05/26/2011    Years since quitting: 12.6    Passive exposure: Past   Smokeless tobacco: Never  Vaping Use   Vaping status: Never Used  Substance Use Topics   Alcohol use: No   Drug use: No     Allergies   Topiramate, Atenolol , Lisinopril, and Tramadol -acetaminophen    Review of Systems Review of Systems  Respiratory:  Positive for cough.      Physical Exam Triage Vital Signs ED Triage Vitals  Encounter Vitals Group     BP 01/20/24 1328 132/68     Girls Systolic BP Percentile --      Girls Diastolic BP Percentile --      Boys Systolic BP Percentile --      Boys Diastolic BP Percentile --      Pulse Rate 01/20/24 1328 83     Resp 01/20/24 1328 16     Temp 01/20/24 1328 98.4 F (36.9 C)     Temp Source 01/20/24 1328 Oral     SpO2 01/20/24 1328 95 %     Weight 01/20/24 1326 230 lb (104.3 kg)     Height --      Head Circumference --      Peak Flow --      Pain  Score 01/20/24 1327 0     Pain Loc --      Pain Education --      Exclude from Growth Chart --    No data found.  Updated Vital Signs BP 132/68 (BP Location: Left Arm)   Pulse 83   Temp 98.4 F (36.9 C) (Oral)   Resp 16   Wt 230 lb (104.3 kg)   SpO2 95%   BMI 33.97 kg/m   Visual Acuity Right Eye Distance:   Left Eye Distance:   Bilateral Distance:    Right Eye Near:   Left Eye Near:    Bilateral Near:     Physical Exam Vitals and nursing note reviewed.  Constitutional:      General: He is not in acute distress.    Appearance: Normal appearance. He is not ill-appearing, toxic-appearing or diaphoretic.  Eyes:     General: No scleral icterus. Cardiovascular:     Rate and Rhythm: Normal rate and regular rhythm.     Heart sounds: Normal heart sounds.  Pulmonary:     Effort: Pulmonary effort is normal. No respiratory distress.     Breath sounds: Normal breath sounds. No wheezing or rhonchi.  Skin:    General: Skin is warm.  Neurological:     Mental Status: He is alert and oriented to person, place, and time.  Psychiatric:        Mood and Affect: Mood normal.        Behavior: Behavior normal.      UC Treatments / Results  Labs (all labs ordered are listed, but only abnormal results are displayed) Labs Reviewed -  No data to display  EKG   Radiology DG Chest 2 View Result Date: 01/20/2024 EXAM: 2 VIEW(S) XRAY OF THE CHEST 01/20/2024 02:31:11 PM COMPARISON: 12/08/2023 CLINICAL HISTORY: Cough. Patient states that he's still having a cough that he had back in August. FINDINGS: LUNGS AND PLEURA: No focal pulmonary opacity. No pulmonary edema. No pleural effusion. No pneumothorax. HEART AND MEDIASTINUM: Left-sided pacemaker is unchanged in position. No acute abnormality of the cardiac and mediastinal silhouettes. BONES AND SOFT TISSUES: No acute osseous abnormality. IMPRESSION: 1. No acute cardiopulmonary process. 2. Left-sided pacemaker unchanged in position.  Electronically signed by: Lynwood Seip MD 01/20/2024 02:48 PM EDT RP Workstation: [id number]    Procedures Procedures (including critical care time)  Medications Ordered in UC Medications - No data to display  Initial Impression / Assessment and Plan / UC Course  I have reviewed the triage vital signs and the nursing notes.  Pertinent labs & imaging results that were available during my care of the patient were reviewed by me and considered in my medical decision making (see chart for details).     Cough-pt prescribed tessalon  pearls but advised to follow-up with pulmonologist for persist cough Final Clinical Impressions(s) / UC Diagnoses   Final diagnoses:  Acute cough   Discharge Instructions   None    ED Prescriptions     Medication Sig Dispense Auth. Provider   benzonatate  (TESSALON ) 100 MG capsule Take 1 capsule (100 mg total) by mouth every 8 (eight) hours. 21 capsule Andra Corean BROCKS, PA-C      PDMP not reviewed this encounter.   Andra Corean BROCKS, PA-C 01/20/24 1615

## 2024-01-20 NOTE — ED Triage Notes (Signed)
 Patient states that he's still having a cough that he had back in August. Patient states that he coughs so much that its hurting his throat. He's having fatigue. Patient states that he coughs up clear mucus and nasal congestion with clear mucus

## 2024-01-22 ENCOUNTER — Emergency Department (HOSPITAL_COMMUNITY)

## 2024-01-22 ENCOUNTER — Other Ambulatory Visit: Payer: Self-pay

## 2024-01-22 ENCOUNTER — Emergency Department (HOSPITAL_COMMUNITY): Admission: EM | Admit: 2024-01-22 | Discharge: 2024-01-22 | Disposition: A | Discharge status: Home / Self Care

## 2024-01-22 ENCOUNTER — Encounter (HOSPITAL_COMMUNITY): Payer: Self-pay

## 2024-01-22 DIAGNOSIS — Z7982 Long term (current) use of aspirin: Secondary | ICD-10-CM | POA: Insufficient documentation

## 2024-01-22 DIAGNOSIS — R053 Chronic cough: Secondary | ICD-10-CM | POA: Insufficient documentation

## 2024-01-22 LAB — CBC
HCT: 34.5 % — ABNORMAL LOW (ref 39.0–52.0)
Hemoglobin: 10.4 g/dL — ABNORMAL LOW (ref 13.0–17.0)
MCH: 21 pg — ABNORMAL LOW (ref 26.0–34.0)
MCHC: 30.1 g/dL (ref 30.0–36.0)
MCV: 69.7 fL — ABNORMAL LOW (ref 80.0–100.0)
Platelets: 199 K/uL (ref 150–400)
RBC: 4.95 MIL/uL (ref 4.22–5.81)
RDW: 17.5 % — ABNORMAL HIGH (ref 11.5–15.5)
WBC: 7.8 K/uL (ref 4.0–10.5)
nRBC: 0 % (ref 0.0–0.2)

## 2024-01-22 LAB — BASIC METABOLIC PANEL WITH GFR
Anion gap: 9 (ref 5–15)
BUN: 9 mg/dL (ref 8–23)
CO2: 25 mmol/L (ref 22–32)
Calcium: 9 mg/dL (ref 8.9–10.3)
Chloride: 101 mmol/L (ref 98–111)
Creatinine, Ser: 1.08 mg/dL (ref 0.61–1.24)
GFR, Estimated: >60 mL/min (ref >60)
Glucose, Bld: 130 mg/dL — ABNORMAL HIGH (ref 70–99)
Potassium: 4.1 mmol/L (ref 3.5–5.1)
Sodium: 135 mmol/L (ref 135–145)

## 2024-01-22 LAB — BRAIN NATRIURETIC PEPTIDE: B Natriuretic Peptide: 196.7 pg/mL — ABNORMAL HIGH (ref 0.0–100.0)

## 2024-01-22 MED ORDER — BENZONATATE 100 MG PO CAPS
100.0000 mg | ORAL_CAPSULE | Freq: Once | ORAL | Status: AC
  Administered 2024-01-22: 100 mg via ORAL
  Filled 2024-01-22: qty 1

## 2024-01-22 NOTE — ED Provider Notes (Signed)
 [place] EMERGENCY DEPARTMENT AT  [facility] Provider Note   CSN: [id number] Arrival date & time: 01/22/24  1036     Patient presents with: Cough   Joel [name] is a 83 y.o. male.   This is an 83 year old male presenting emergency department with cough.  Reports cough since August.  Coughs up minimal amount of clear phlegm/mucus in nose with start running.  He denies any chest pain or shortness of breath.  No fevers.  Has been seen at the urgent care and prescribed steroids, dextromethorphan , Tessalon  Perles, prednisone , Mucinex .  He is not on ACE inhibitor.  Reports that his cough continues.   Cough      Prior to Admission medications   Medication Sig Start Date End Date Taking? Authorizing Provider  abiraterone  acetate (ZYTIGA ) 250 MG tablet Take 250 mg by mouth daily. On an empty stomach 02/17/18   [provider]  Accu-Chek FastClix Lancets MISC Test once daily 11/23/18   Henson, Vickie L, NP-C  albuterol  (VENTOLIN  HFA) 108 (90 Base) MCG/ACT inhaler INHALE 2 PUFFS INTO THE LUNGS EVERY 6 (SIX) HOURS AS NEEDED FOR WHEEZING OR SHORTNESS OF BREATH. 01/09/24   Tysinger, Alm RAMAN, PA-C  amLODipine  (NORVASC ) 10 MG tablet TAKE 1 TABLET EVERY DAY 11/23/23   Verlin Lonni BIRCH, MD  aspirin  EC 81 MG tablet Take 1 tablet (81 mg total) by mouth daily. Swallow whole. 03/17/22   Verlin Lonni BIRCH, MD  benzonatate  (TESSALON ) 100 MG capsule Take 1 capsule (100 mg total) by mouth every 8 (eight) hours. 01/20/24   Andra Corean BROCKS, PA-C  brimonidine  (ALPHAGAN ) 0.2 % ophthalmic solution Place 1 drop into both eyes 2 (two) times daily. 05/09/20   [provider]  Dextromethorphan -guaiFENesin  (MUCINEX  DM MAXIMUM STRENGTH) 60-1200 MG TB12 Take 1 tablet by mouth 2 (two) times daily. 12/08/23   Iola Lukes, FNP  diclofenac  Sodium (VOLTAREN ) 1 % GEL Apply 2 g topically 2 (two) times daily as needed (back pain). 06/16/20   [provider]  DULoxetine  (CYMBALTA) 20 MG capsule Take one capsule daily for one week then increase to one capsule twice daily 10/24/23   [provider]  ferrous gluconate  (FERGON) 324 MG tablet Take 1 tablet (324 mg total) by mouth daily with breakfast. 05/13/23   Tysinger, Alm RAMAN, PA-C  gabapentin  (NEURONTIN ) 600 MG tablet Take 600 mg by mouth 2 (two) times daily.    [provider]  isosorbide  mononitrate (IMDUR ) 120 MG 24 hr tablet Take 1 tablet (120 mg total) by mouth daily. 02/21/23   Verlin Lonni BIRCH, MD  latanoprost  (XALATAN ) 0.005 % ophthalmic solution Place 1 drop into both eyes at bedtime. 02/28/20   [provider]  nitroGLYCERIN  (NITROSTAT ) 0.4 MG SL tablet Place 1 tablet (0.4 mg total) under the tongue every 5 (five) minutes as needed for chest pain (max 3 doses). 02/19/22   Verlin Lonni BIRCH, MD  omeprazole (PRILOSEC) 20 MG capsule Take 20 mg by mouth daily.    [provider]  polyethylene glycol powder (GLYCOLAX /MIRALAX ) 17 GM/SCOOP powder Take 17 g by mouth daily. 05/13/23   Tysinger, Alm RAMAN, PA-C  pravastatin  (PRAVACHOL ) 40 MG tablet TAKE 1 TABLET EVERY EVENING 11/23/23   Verlin Lonni BIRCH, MD  predniSONE  (DELTASONE ) 50 MG tablet Take 1 tab po daily 01/05/24   Andra Corean C, PA-C  promethazine -dextromethorphan  (PROMETHAZINE -DM) 6.25-15 MG/5ML syrup Take 5 mLs by mouth 4 (four) times daily as needed for cough. 01/05/24   Andra Corean BROCKS, PA-C  tamsulosin  (FLOMAX ) 0.4 MG CAPS capsule Take 0.4 mg by mouth every evening.    [provider]  traMADol  (ULTRAM ) 50 MG tablet Take 1 tablet (50 mg total) by mouth every 12 (twelve) hours as needed. 08/25/23   Tegeler, Lonni PARAS, MD    Allergies: Topiramate, Atenolol , Lisinopril, and Tramadol -acetaminophen     Review of Systems  Respiratory:  Positive for cough.     Updated Vital Signs BP (!) 180/89   Pulse 89   Temp 98.6 F (37 C)   Resp 18   SpO2 96%   Physical Exam Vitals and  nursing note reviewed.  Constitutional:      General: He is not in acute distress.    Appearance: He is not toxic-appearing.  HENT:     Head: Normocephalic.     Nose:     Comments: Boggy turbinates with mucus    Mouth/Throat:     Mouth: Mucous membranes are moist.     Pharynx: No oropharyngeal exudate or posterior oropharyngeal erythema.  Eyes:     Conjunctiva/sclera: Conjunctivae normal.  Cardiovascular:     Rate and Rhythm: Normal rate and regular rhythm.  Pulmonary:     Effort: Pulmonary effort is normal.     Breath sounds: Normal breath sounds.  Abdominal:     General: Abdomen is flat. There is no distension.     Palpations: Abdomen is soft.     Tenderness: There is no abdominal tenderness. There is no guarding or rebound.  Musculoskeletal:        General: Normal range of motion.  Skin:    General: Skin is warm and dry.     Capillary Refill: Capillary refill takes less than 2 seconds.  Neurological:     Mental Status: He is alert and oriented to person, place, and time.  Psychiatric:        Mood and Affect: Mood normal.        Behavior: Behavior normal.     (all labs ordered are listed, but only abnormal results are displayed) Labs Reviewed  CBC - Abnormal; Notable for the following components:      Result Value   Hemoglobin 10.4 (*)    HCT 34.5 (*)    MCV 69.7 (*)    MCH 21.0 (*)    RDW 17.5 (*)    All other components within normal limits  BASIC METABOLIC PANEL WITH GFR - Abnormal; Notable for the following components:   Glucose, Bld 130 (*)    All other components within normal limits  BRAIN NATRIURETIC PEPTIDE - Abnormal; Notable for the following components:   B Natriuretic Peptide 196.7 (*)    All other components within normal limits    EKG: EKG Interpretation Date/Time:  Sunday January 22 2024 12:29:49 EDT Ventricular Rate:  88 PR Interval:  180 QRS Duration:  128 QT Interval:  402 QTC Calculation: 486 R Axis:   51  Text  Interpretation: Atrial-sensed ventricular-paced rhythm Abnormal ECG When compared with ECG of 25-Aug-2023 08:15, PREVIOUS ECG IS PRESENT Confirmed by Neysa Clap [phone]) on 01/22/2024 1:03:11 PM  Radiology: DG Chest 2 View Result Date: 01/22/2024 CLINICAL DATA:  Cough. EXAM: CHEST - 2 VIEW COMPARISON:  January 20, 2024 FINDINGS: There is stable dual lead AICD positioning. The heart size and mediastinal contours are within normal limits. Marked severity calcification of the aortic arch is noted. Both lungs are clear. The visualized skeletal structures are unremarkable. IMPRESSION: No active cardiopulmonary disease. Electronically Signed   By: Suzen  Houston M.D.   On: 01/22/2024 12:55   DG Chest 2 View Result Date: 01/20/2024 EXAM: 2 VIEW(S) XRAY OF THE CHEST 01/20/2024 02:31:11 PM COMPARISON: 12/08/2023 CLINICAL HISTORY: Cough. Patient states that he's still having a cough that he had back in August. FINDINGS: LUNGS AND PLEURA: No focal pulmonary opacity. No pulmonary edema. No pleural effusion. No pneumothorax. HEART AND MEDIASTINUM: Left-sided pacemaker is unchanged in position. No acute abnormality of the cardiac and mediastinal silhouettes. BONES AND SOFT TISSUES: No acute osseous abnormality. IMPRESSION: 1. No acute cardiopulmonary process. 2. Left-sided pacemaker unchanged in position. Electronically signed by: Lynwood Seip MD 01/20/2024 02:48 PM EDT RP Workstation: [id number]     Procedures   Medications Ordered in the ED  benzonatate  (TESSALON ) capsule 100 mg (100 mg Oral Given 01/22/24 1324)    Clinical Course as of 01/22/24 1414  Sun Jan 22, 2024  1206 CXR 10/10: IMPRESSION: 1. No acute cardiopulmonary process. 2. Left-sided pacemaker unchanged in position. [TY]  1316 DG Chest 2 View IMPRESSION: No active cardiopulmonary disease.   Electronically Signed   By: Suzen Dials M.D.   On: 01/22/2024 12:55   [TY]    Clinical Course User Index [TY] Neysa Caron PARAS, DO                                  Medical Decision Making This is an 83 year old male presenting emergency department with chronic cough.  This been ongoing for what sounds like the better part of a year as he saw pulmonology in June per my independent chart review and per their documentation is been going on for about 8 months.  He does have a mild cough, but his lungs are clear.  Per review has not had negative chest x-ray days ago at urgent care.  Repeated here today, no pneumonia or acute acute intrapulmonary process.  He is not hypoxic not in respiratory distress.  Basic screening labs largely reassuring.  No leukocytosis to suggest systemic infection.  Stable chronic anemia.  No metabolic derangements.  His BNP is mildly elevated, but clinically euvolemic and chest x-ray clear.  Lower suspicion for cardiac etiology of his cough.  Given his boggy turbinates, nasal congestion/rhinorrhea that he complains of I question whether or not his cough is secondary to postnasal drip/allergy.  Did give a Tessalon  here.  However discussed over-the-counter Flonase , nasal rinses and oral antihistamine.  Encouraged patient to follow-up with the pulmonologist for further workup and evaluation.  He is already on omeprazole, he is not taking lisinopril.  Given the duration of the symptoms and reassuring vitals and workup today.  Will discharge in stable condition as it does not appear that he has an acute emergent condition at this time.  Amount and/or Complexity of Data Reviewed Labs: ordered. Radiology: ordered. Decision-making details documented in ED Course. ECG/medicine tests: ordered.  Risk Prescription drug management.      Final diagnoses:  None    ED Discharge Orders     None          Neysa Caron PARAS, DO 01/22/24 1414

## 2024-01-22 NOTE — ED Notes (Signed)
 Phlebotomy to collect blood work

## 2024-01-22 NOTE — Discharge Instructions (Signed)
 Your chest x-ray was clear, your lab work was reassuring.  It does not appear that you have an acute emergent cause of your cough.  As such we feel that you are stable for discharge and outpatient follow-up.  We would like you to follow-up with pulmonology again who are the specialist that deals with chronic cough.  You may try a daily over-the-counter antihistamine Zyrtec, and Flonase  which may help your cough.  Continue to take your medications as prescribed by your primary doctor.  Return if develop any new or worsening symptoms that are concerning to you.

## 2024-01-22 NOTE — ED Notes (Signed)
 Awaiting patient from lobby.

## 2024-01-22 NOTE — ED Triage Notes (Signed)
 Pt c.o continued cough since August. Clear mucous. Denies Sob or CP the patient seen at UC 3 times for the same

## 2024-01-25 ENCOUNTER — Ambulatory Visit: Admitting: Vascular Surgery

## 2024-01-25 ENCOUNTER — Ambulatory Visit (HOSPITAL_COMMUNITY)
Admission: RE | Admit: 2024-01-25 | Discharge: 2024-01-25 | Disposition: A | Discharge status: Home / Self Care | Source: Ambulatory Visit | Attending: Vascular Surgery | Admitting: Vascular Surgery

## 2024-01-25 DIAGNOSIS — I70219 Atherosclerosis of native arteries of extremities with intermittent claudication, unspecified extremity: Secondary | ICD-10-CM | POA: Insufficient documentation

## 2024-01-26 LAB — VAS US ABI WITH/WO TBI
Left ABI: 0.89
Right ABI: 0.46

## 2024-02-01 ENCOUNTER — Ambulatory Visit: Admitting: Internal Medicine

## 2024-02-07 DIAGNOSIS — E669 Obesity, unspecified: Secondary | ICD-10-CM | POA: Diagnosis not present

## 2024-02-07 DIAGNOSIS — Z7952 Long term (current) use of systemic steroids: Secondary | ICD-10-CM | POA: Diagnosis not present

## 2024-02-07 DIAGNOSIS — I2510 Atherosclerotic heart disease of native coronary artery without angina pectoris: Secondary | ICD-10-CM | POA: Diagnosis not present

## 2024-02-07 DIAGNOSIS — Z9989 Dependence on other enabling machines and devices: Secondary | ICD-10-CM | POA: Diagnosis not present

## 2024-02-07 DIAGNOSIS — Z7982 Long term (current) use of aspirin: Secondary | ICD-10-CM | POA: Diagnosis not present

## 2024-02-07 DIAGNOSIS — D84821 Immunodeficiency due to drugs: Secondary | ICD-10-CM | POA: Diagnosis not present

## 2024-02-07 DIAGNOSIS — K219 Gastro-esophageal reflux disease without esophagitis: Secondary | ICD-10-CM | POA: Diagnosis not present

## 2024-02-07 DIAGNOSIS — Z950 Presence of cardiac pacemaker: Secondary | ICD-10-CM | POA: Diagnosis not present

## 2024-02-07 DIAGNOSIS — I129 Hypertensive chronic kidney disease with stage 1 through stage 4 chronic kidney disease, or unspecified chronic kidney disease: Secondary | ICD-10-CM | POA: Diagnosis not present

## 2024-02-07 DIAGNOSIS — I719 Aortic aneurysm of unspecified site, without rupture: Secondary | ICD-10-CM | POA: Diagnosis not present

## 2024-02-07 DIAGNOSIS — R600 Localized edema: Secondary | ICD-10-CM | POA: Diagnosis not present

## 2024-02-07 DIAGNOSIS — J439 Emphysema, unspecified: Secondary | ICD-10-CM | POA: Diagnosis not present

## 2024-02-07 DIAGNOSIS — Z6833 Body mass index (BMI) 33.0-33.9, adult: Secondary | ICD-10-CM | POA: Diagnosis not present

## 2024-02-07 DIAGNOSIS — N182 Chronic kidney disease, stage 2 (mild): Secondary | ICD-10-CM | POA: Diagnosis not present

## 2024-02-07 DIAGNOSIS — E785 Hyperlipidemia, unspecified: Secondary | ICD-10-CM | POA: Diagnosis not present

## 2024-02-07 DIAGNOSIS — N400 Enlarged prostate without lower urinary tract symptoms: Secondary | ICD-10-CM | POA: Diagnosis not present

## 2024-02-07 DIAGNOSIS — Z8546 Personal history of malignant neoplasm of prostate: Secondary | ICD-10-CM | POA: Diagnosis not present

## 2024-02-10 ENCOUNTER — Other Ambulatory Visit: Payer: Self-pay

## 2024-02-10 DIAGNOSIS — I70219 Atherosclerosis of native arteries of extremities with intermittent claudication, unspecified extremity: Secondary | ICD-10-CM

## 2024-02-10 DIAGNOSIS — I712 Thoracic aortic aneurysm, without rupture, unspecified: Secondary | ICD-10-CM

## 2024-02-18 ENCOUNTER — Encounter (HOSPITAL_COMMUNITY): Payer: Self-pay | Admitting: *Deleted

## 2024-02-18 ENCOUNTER — Emergency Department (HOSPITAL_COMMUNITY)

## 2024-02-18 ENCOUNTER — Other Ambulatory Visit: Payer: Self-pay

## 2024-02-18 ENCOUNTER — Emergency Department (HOSPITAL_COMMUNITY)
Admission: EM | Admit: 2024-02-18 | Discharge: 2024-02-19 | Disposition: A | Discharge status: Home / Self Care | Attending: Emergency Medicine | Admitting: Emergency Medicine

## 2024-02-18 DIAGNOSIS — K7689 Other specified diseases of liver: Secondary | ICD-10-CM | POA: Diagnosis not present

## 2024-02-18 DIAGNOSIS — Z950 Presence of cardiac pacemaker: Secondary | ICD-10-CM | POA: Insufficient documentation

## 2024-02-18 DIAGNOSIS — I7121 Aneurysm of the ascending aorta, without rupture: Secondary | ICD-10-CM | POA: Diagnosis not present

## 2024-02-18 DIAGNOSIS — N189 Chronic kidney disease, unspecified: Secondary | ICD-10-CM | POA: Insufficient documentation

## 2024-02-18 DIAGNOSIS — Z95820 Peripheral vascular angioplasty status with implants and grafts: Secondary | ICD-10-CM | POA: Diagnosis not present

## 2024-02-18 DIAGNOSIS — I2510 Atherosclerotic heart disease of native coronary artery without angina pectoris: Secondary | ICD-10-CM | POA: Insufficient documentation

## 2024-02-18 DIAGNOSIS — R052 Subacute cough: Secondary | ICD-10-CM | POA: Insufficient documentation

## 2024-02-18 DIAGNOSIS — Z7982 Long term (current) use of aspirin: Secondary | ICD-10-CM | POA: Diagnosis not present

## 2024-02-18 DIAGNOSIS — E1122 Type 2 diabetes mellitus with diabetic chronic kidney disease: Secondary | ICD-10-CM | POA: Insufficient documentation

## 2024-02-18 DIAGNOSIS — K5730 Diverticulosis of large intestine without perforation or abscess without bleeding: Secondary | ICD-10-CM | POA: Diagnosis not present

## 2024-02-18 DIAGNOSIS — R058 Other specified cough: Secondary | ICD-10-CM | POA: Diagnosis not present

## 2024-02-18 DIAGNOSIS — R0602 Shortness of breath: Secondary | ICD-10-CM | POA: Diagnosis present

## 2024-02-18 DIAGNOSIS — Z7984 Long term (current) use of oral hypoglycemic drugs: Secondary | ICD-10-CM | POA: Diagnosis not present

## 2024-02-18 DIAGNOSIS — I129 Hypertensive chronic kidney disease with stage 1 through stage 4 chronic kidney disease, or unspecified chronic kidney disease: Secondary | ICD-10-CM | POA: Insufficient documentation

## 2024-02-18 DIAGNOSIS — K449 Diaphragmatic hernia without obstruction or gangrene: Secondary | ICD-10-CM | POA: Diagnosis not present

## 2024-02-18 DIAGNOSIS — R059 Cough, unspecified: Secondary | ICD-10-CM | POA: Diagnosis present

## 2024-02-18 DIAGNOSIS — Z79899 Other long term (current) drug therapy: Secondary | ICD-10-CM | POA: Diagnosis not present

## 2024-02-18 DIAGNOSIS — Q2543 Congenital aneurysm of aorta: Secondary | ICD-10-CM | POA: Insufficient documentation

## 2024-02-18 LAB — COMPREHENSIVE METABOLIC PANEL WITH GFR
ALT: 14 U/L (ref 0–44)
AST: 18 U/L (ref 15–41)
Albumin: 3.6 g/dL (ref 3.5–5.0)
Alkaline Phosphatase: 65 U/L (ref 38–126)
Anion gap: 14 (ref 5–15)
BUN: 13 mg/dL (ref 8–23)
CO2: 20 mmol/L — ABNORMAL LOW (ref 22–32)
Calcium: 9.3 mg/dL (ref 8.9–10.3)
Chloride: 100 mmol/L (ref 98–111)
Creatinine, Ser: 1.19 mg/dL (ref 0.61–1.24)
GFR, Estimated: >60 mL/min (ref >60)
Glucose, Bld: 117 mg/dL — ABNORMAL HIGH (ref 70–99)
Potassium: 3.6 mmol/L (ref 3.5–5.1)
Sodium: 134 mmol/L — ABNORMAL LOW (ref 135–145)
Total Bilirubin: 0.4 mg/dL (ref 0.0–1.2)
Total Protein: 6.8 g/dL (ref 6.5–8.1)

## 2024-02-18 LAB — I-STAT CHEM 8, ED
BUN: 13 mg/dL (ref 8–23)
Calcium, Ion: 1.16 mmol/L (ref 1.15–1.40)
Chloride: 101 mmol/L (ref 98–111)
Creatinine, Ser: 1.3 mg/dL — ABNORMAL HIGH (ref 0.61–1.24)
Glucose, Bld: 120 mg/dL — ABNORMAL HIGH (ref 70–99)
HCT: 34 % — ABNORMAL LOW (ref 39.0–52.0)
Hemoglobin: 11.6 g/dL — ABNORMAL LOW (ref 13.0–17.0)
Potassium: 3.6 mmol/L (ref 3.5–5.1)
Sodium: 135 mmol/L (ref 135–145)
TCO2: 23 mmol/L (ref 22–32)

## 2024-02-18 LAB — CBC WITH DIFFERENTIAL/PLATELET
Basophils Absolute: 0.5 K/uL — ABNORMAL HIGH (ref 0.0–0.1)
Basophils Relative: 5 %
Eosinophils Absolute: 0.7 K/uL — ABNORMAL HIGH (ref 0.0–0.5)
Eosinophils Relative: 8 %
HCT: 34.2 % — ABNORMAL LOW (ref 39.0–52.0)
Hemoglobin: 10.5 g/dL — ABNORMAL LOW (ref 13.0–17.0)
Lymphocytes Relative: 18 %
Lymphs Abs: 1.7 K/uL (ref 0.7–4.0)
MCH: 21.5 pg — ABNORMAL LOW (ref 26.0–34.0)
MCHC: 30.7 g/dL (ref 30.0–36.0)
MCV: 69.9 fL — ABNORMAL LOW (ref 80.0–100.0)
Monocytes Absolute: 0.2 K/uL (ref 0.1–1.0)
Monocytes Relative: 2 %
Neutro Abs: 6.2 K/uL (ref 1.7–7.7)
Neutrophils Relative %: 67 %
Platelets: 196 K/uL (ref 150–400)
RBC: 4.89 MIL/uL (ref 4.22–5.81)
RDW: 18.6 % — ABNORMAL HIGH (ref 11.5–15.5)
WBC: 9.2 K/uL (ref 4.0–10.5)
nRBC: 0 % (ref 0.0–0.2)

## 2024-02-18 LAB — BRAIN NATRIURETIC PEPTIDE: B Natriuretic Peptide: 66.5 pg/mL (ref 0.0–100.0)

## 2024-02-18 LAB — TROPONIN I (HIGH SENSITIVITY): Troponin I (High Sensitivity): 14 ng/L (ref <18)

## 2024-02-18 MED ORDER — HYDROCOD POLI-CHLORPHE POLI ER 10-8 MG/5ML PO SUER: 5.0000 mL | Freq: Two times a day (BID) | ORAL | 0 refills | Status: DC | PRN

## 2024-02-18 MED ORDER — IPRATROPIUM BROMIDE 0.02 % IN SOLN
0.5000 mg | Freq: Once | RESPIRATORY_TRACT | Status: AC
  Administered 2024-02-18: 0.5 mg via RESPIRATORY_TRACT
  Filled 2024-02-18: qty 2.5

## 2024-02-18 MED ORDER — ALBUTEROL SULFATE (2.5 MG/3ML) 0.083% IN NEBU
5.0000 mg | INHALATION_SOLUTION | Freq: Once | RESPIRATORY_TRACT | Status: AC
  Administered 2024-02-18: 5 mg via RESPIRATORY_TRACT
  Filled 2024-02-18: qty 6

## 2024-02-18 MED ORDER — IOHEXOL 350 MG/ML SOLN
75.0000 mL | Freq: Once | INTRAVENOUS | Status: AC | PRN
  Administered 2024-02-18: 75 mL via INTRAVENOUS

## 2024-02-18 MED ORDER — ALBUTEROL SULFATE HFA 108 (90 BASE) MCG/ACT IN AERS
2.0000 | INHALATION_SPRAY | RESPIRATORY_TRACT | Status: DC | PRN
  Administered 2024-02-19: 2 via RESPIRATORY_TRACT
  Filled 2024-02-18: qty 6.7

## 2024-02-18 NOTE — ED Triage Notes (Addendum)
 Pt c/o SHOB and chest tightness that started tonight, reports he's had an ongoing cough for about 30 days that he has been to urgent care for multiple times.

## 2024-02-18 NOTE — ED Provider Notes (Signed)
 [place] EMERGENCY DEPARTMENT AT [facility] Provider Note   CSN: [id number] Arrival date & time: 02/18/24  2042     Patient presents with: No chief complaint on file.   Joel [name] is a 83 y.o. male history of CAD, diabetes, CKD, complete heart block with a pacemaker, here presenting with cough and shortness of breath.  Patient had cough for about a month now.  Patient was seen in the ED several times with similar symptoms and was thought to have viral syndrome and most recently was thought to have postnasal drip.  Patient states that he has been persistently coughing despite getting Tessalon  Perles and this evening had a coughing spell and had worsening shortness of breath.  Patient denies any chest pain.  Patient denies any fevers or chills or sick contacts.  Of note, patient did have a CT in April that showed thoracic aneurysm and patient did not have any interventions done for it.   The history is provided by the patient.       Prior to Admission medications   Medication Sig Start Date End Date Taking? Authorizing Provider  abiraterone  acetate (ZYTIGA ) 250 MG tablet Take 250 mg by mouth daily. On an empty stomach 02/17/18   [provider]  Accu-Chek FastClix Lancets MISC Test once daily 11/23/18   Henson, Vickie L, NP-C  albuterol  (VENTOLIN  HFA) 108 (90 Base) MCG/ACT inhaler INHALE 2 PUFFS INTO THE LUNGS EVERY 6 (SIX) HOURS AS NEEDED FOR WHEEZING OR SHORTNESS OF BREATH. 01/09/24   Tysinger, Alm RAMAN, PA-C  amLODipine  (NORVASC ) 10 MG tablet TAKE 1 TABLET EVERY DAY 11/23/23   Verlin Lonni BIRCH, MD  aspirin  EC 81 MG tablet Take 1 tablet (81 mg total) by mouth daily. Swallow whole. 03/17/22   Verlin Lonni BIRCH, MD  benzonatate  (TESSALON ) 100 MG capsule Take 1 capsule (100 mg total) by mouth every 8 (eight) hours. 01/20/24   Andra Corean BROCKS, PA-C  brimonidine  (ALPHAGAN ) 0.2 % ophthalmic solution Place 1 drop into both eyes 2 (two) times daily.  05/09/20   [provider]  Dextromethorphan -guaiFENesin  (MUCINEX  DM MAXIMUM STRENGTH) 60-1200 MG TB12 Take 1 tablet by mouth 2 (two) times daily. 12/08/23   Iola Lukes, FNP  diclofenac  Sodium (VOLTAREN ) 1 % GEL Apply 2 g topically 2 (two) times daily as needed (back pain). 06/16/20   [provider]  DULoxetine (CYMBALTA) 20 MG capsule Take one capsule daily for one week then increase to one capsule twice daily 10/24/23   [provider]  ferrous gluconate  (FERGON) 324 MG tablet Take 1 tablet (324 mg total) by mouth daily with breakfast. 05/13/23   Tysinger, Alm RAMAN, PA-C  gabapentin  (NEURONTIN ) 600 MG tablet Take 600 mg by mouth 2 (two) times daily.    [provider]  isosorbide  mononitrate (IMDUR ) 120 MG 24 hr tablet Take 1 tablet (120 mg total) by mouth daily. 02/21/23   Verlin Lonni BIRCH, MD  latanoprost  (XALATAN ) 0.005 % ophthalmic solution Place 1 drop into both eyes at bedtime. 02/28/20   [provider]  nitroGLYCERIN  (NITROSTAT ) 0.4 MG SL tablet Place 1 tablet (0.4 mg total) under the tongue every 5 (five) minutes as needed for chest pain (max 3 doses). 02/19/22   Verlin Lonni BIRCH, MD  omeprazole (PRILOSEC) 20 MG capsule Take 20 mg by mouth daily.    [provider]  polyethylene glycol powder (GLYCOLAX /MIRALAX ) 17 GM/SCOOP powder Take 17 g by mouth daily. 05/13/23   Tysinger, Alm RAMAN, PA-C  pravastatin  (PRAVACHOL ) 40 MG tablet TAKE 1 TABLET EVERY EVENING 11/23/23   Verlin Lonni BIRCH, MD  predniSONE  (DELTASONE ) 50 MG tablet Take 1 tab po daily 01/05/24   Andra Corean BROCKS, PA-C  promethazine -dextromethorphan  (PROMETHAZINE -DM) 6.25-15 MG/5ML syrup Take 5 mLs by mouth 4 (four) times daily as needed for cough. 01/05/24   Andra Corean BROCKS, PA-C  tamsulosin  (FLOMAX ) 0.4 MG CAPS capsule Take 0.4 mg by mouth every evening.    [provider]  traMADol  (ULTRAM ) 50 MG tablet Take 1 tablet (50 mg total) by mouth  every 12 (twelve) hours as needed. 08/25/23   Tegeler, Lonni PARAS, MD    Allergies: Topiramate, Atenolol , Lisinopril, and Tramadol -acetaminophen     Review of Systems  Respiratory:  Positive for cough and shortness of breath.   All other systems reviewed and are negative.   Updated Vital Signs BP (!) 149/77   Pulse 68   Temp 97.8 F (36.6 C) (Oral)   Resp 15   Ht 5' 9 (1.753 m)   Wt 104.3 kg   SpO2 94%   BMI 33.96 kg/m   Physical Exam Vitals and nursing note reviewed.  HENT:     Head: Normocephalic.     Nose: Nose normal.     Mouth/Throat:     Mouth: Mucous membranes are moist.  Eyes:     Extraocular Movements: Extraocular movements intact.     Pupils: Pupils are equal, round, and reactive to light.  Cardiovascular:     Rate and Rhythm: Normal rate and regular rhythm.     Pulses: Normal pulses.     Heart sounds: Normal heart sounds.  Pulmonary:     Comments: Diminished bilaterally and no crackles or wheezing Abdominal:     General: Abdomen is flat.     Palpations: Abdomen is soft.  Musculoskeletal:        General: Normal range of motion.     Cervical back: Normal range of motion and neck supple.  Skin:    General: Skin is warm.     Capillary Refill: Capillary refill takes less than 2 seconds.  Neurological:     General: No focal deficit present.     Mental Status: He is alert and oriented to person, place, and time.  Psychiatric:        Mood and Affect: Mood normal.        Behavior: Behavior normal.     (all labs ordered are listed, but only abnormal results are displayed) Labs Reviewed  CBC WITH DIFFERENTIAL/PLATELET  COMPREHENSIVE METABOLIC PANEL WITH GFR  I-STAT CHEM 8, ED  TROPONIN I (HIGH SENSITIVITY)    EKG: None  Radiology: No results found.   Procedures   Medications Ordered in the ED  albuterol  (PROVENTIL ) (2.5 MG/3ML) 0.083% nebulizer solution 5 mg (has no administration in time range)  ipratropium (ATROVENT ) nebulizer solution  0.5 mg (has no administration in time range)                                    Medical Decision Making Joel [name] is a 83 y.o. male here presenting cough.  Likely bronchitis and low suspicion for ACS or pneumonia.  Patient does have a history of thoracic aneurysm and had worsening shortness of breath and is hypertensive.  Will get CT dissection study to evaluate for dissection and also the aneurysm.  Plan to get CBC and CMP and troponin and COVID  test.  Will give DuoNeb and reassess  11:44 PM Troponin x 1 is negative.  Dissection study showed stable aneurysm.  Patient feeling better after albuterol .  Waiting on second troponin.  If negative anticipate discharge home with albuterol  and cough medicine.  Signed out to Dr. Midge   Amount and/or Complexity of Data Reviewed Labs: ordered. Radiology: ordered.  Risk Prescription drug management.     Final diagnoses:  None    ED Discharge Orders     None          Patt Alm Macho, MD 02/18/24 2344

## 2024-02-18 NOTE — Discharge Instructions (Signed)
 Your workup is normal and you have thoracic aneurysm that is stable   Take tussionex for cough and albuterol  as needed for shortness of breath   See your doctor for follow up   Return to ER if you have worse cough or shortness of breath or trouble breathing

## 2024-02-19 LAB — TROPONIN I (HIGH SENSITIVITY): Troponin I (High Sensitivity): 14 ng/L (ref <18)

## 2024-02-19 LAB — RESP PANEL BY RT-PCR (RSV, FLU A&B, COVID)  RVPGX2
Influenza A by PCR: NEGATIVE
Influenza B by PCR: NEGATIVE
Resp Syncytial Virus by PCR: NEGATIVE
SARS Coronavirus 2 by RT PCR: NEGATIVE

## 2024-02-19 NOTE — ED Provider Notes (Signed)
 I assumed care at signout.  Plan was to follow-up on troponin.  Both troponins are unremarkable.  Patient is in no distress and reports feeling improved When I reviewed his chart, patient has a history of chronic cough and has been seen by pulmonology before.  Will refer back to pulmonology.  Patient walked in the ER no distress.  There is been no hypoxia. Patient is safe for discharge   Midge Golas, MD 02/19/24 [phone]

## 2024-02-19 NOTE — ED Notes (Signed)
 Pt ambulated to the bathroom. Able to ambulate independently if Pt has cane.

## 2024-02-19 NOTE — ED Notes (Signed)
 Pt given water

## 2024-02-20 DIAGNOSIS — M48062 Spinal stenosis, lumbar region with neurogenic claudication: Secondary | ICD-10-CM | POA: Diagnosis not present

## 2024-02-20 DIAGNOSIS — M5416 Radiculopathy, lumbar region: Secondary | ICD-10-CM | POA: Diagnosis not present

## 2024-02-24 ENCOUNTER — Other Ambulatory Visit (HOSPITAL_COMMUNITY): Payer: Self-pay

## 2024-02-24 ENCOUNTER — Other Ambulatory Visit: Payer: Self-pay | Admitting: Medical

## 2024-02-24 NOTE — Telephone Encounter (Signed)
 Copied from CRM #[id number]. Topic: Clinical - Medication Refill >> Feb 24, 2024  3:32 PM Hadassah PARAS wrote: Medication: chlorpheniramine-HYDROcodone  (TUSSIONEX) 10-8 MG/5ML  Has the patient contacted their pharmacy? Yes (Agent: If no, request that the patient contact the pharmacy for the refill. If patient does not wish to contact the pharmacy document the reason why and proceed with request.) (Agent: If yes, when and what did the pharmacy advise?)  This is the patient's preferred pharmacy:  [facility] [phone] - [place], [place] - [address] [facility] [address] [place] [address] Phone: [phone] Fax: [phone]    Is this the correct pharmacy for this prescription? Yes If no, delete pharmacy and type the correct one.   Has the prescription been filled recently? No  Is the patient out of the medication? Yes  Has the patient been seen for an appointment in the last year OR does the patient have an upcoming appointment? Yes  Can we respond through MyChart? Yes  Agent: Please be advised that Rx refills may take up to 3 business days. We ask that you follow-up with your pharmacy.

## 2024-02-25 ENCOUNTER — Other Ambulatory Visit: Payer: Self-pay | Admitting: Medical

## 2024-02-25 DIAGNOSIS — Z79899 Other long term (current) drug therapy: Secondary | ICD-10-CM

## 2024-02-28 MED ORDER — HYDROCOD POLI-CHLORPHE POLI ER 10-8 MG/5ML PO SUER: 5.0000 mL | Freq: Two times a day (BID) | ORAL | 0 refills | Status: DC | PRN

## 2024-03-01 ENCOUNTER — Ambulatory Visit: Payer: Self-pay

## 2024-03-01 NOTE — Telephone Encounter (Signed)
 FYI Only or Action Required?: FYI only for provider: appointment scheduled on 03/02/24.  Patient was last seen in primary care on 11/25/2023 by Bulah Alm RAMAN, PA-C.  Called Nurse Triage reporting Diarrhea.  Symptoms began several days ago.  Interventions attempted: OTC medications: Kaopectate.  Symptoms are: stable.  Triage Disposition: See Physician Within 24 Hours  Patient/caregiver understands and will follow disposition?: Yes   Copied from CRM #[id number]. Topic: Clinical - Red Word Triage >> Mar 01, 2024  3:18 PM Fonda T wrote: [facility] that prompted transfer to Nurse Triage: Pt  calling, reports he is having some stomach/abdominal pain, with diarrhea, where at times he is soiling clothes, and is uncontrollable.  Pt reports it is worsening over last few days. Reason for Disposition  [1] MODERATE diarrhea (e.g., 4-6 times / day more than normal) AND [2] present > 48 hours (2 days)  Answer Assessment - Initial Assessment Questions 1. DIARRHEA SEVERITY: How bad is the diarrhea? How many more stools have you had in the past 24 hours than normal?      5-6 2. ONSET: When did the diarrhea begin?      X 2 days, worsened today 3. STOOL DESCRIPTION:  How loose or watery is the diarrhea? What is the stool color? Is there any blood or mucous in the stool?     Loose 4. VOMITING: Are you also vomiting? If Yes, ask: How many times in the past 24 hours?      None 5. ABDOMEN PAIN: Are you having any abdomen pain? If Yes, ask: What does it feel like? (e.g., crampy, dull, intermittent, constant)      Abd pain, intermittent 6. ABDOMEN PAIN SEVERITY: If present, ask: How bad is the pain?  (e.g., Scale 1-10; mild, moderate, or severe)     Every once in a while 5-7/10 7. ORAL INTAKE: If vomiting, Have you been able to drink liquids? How much liquids have you had in the past 24 hours?     Eating/Drinking normally 8. HYDRATION: Any signs of dehydration? (e.g., dry  mouth [not just dry lips], too weak to stand, dizziness, new weight loss) When did you last urinate?     None 9. EXPOSURE: Have you traveled to a foreign country recently? Have you been exposed to anyone with diarrhea? Could you have eaten any food that was spoiled?     None 10. ANTIBIOTIC USE: Are you taking antibiotics now or have you taken antibiotics in the past 2 months?       None 11. OTHER SYMPTOMS: Do you have any other symptoms? (e.g., fever, blood in stool)       None  Protocols used: Diarrhea-A-AH

## 2024-03-02 ENCOUNTER — Ambulatory Visit (INDEPENDENT_AMBULATORY_CARE_PROVIDER_SITE_OTHER): Admitting: Family Medicine

## 2024-03-02 ENCOUNTER — Encounter: Payer: Self-pay | Admitting: Family Medicine

## 2024-03-02 VITALS — BP 136/78 | HR 64 | Wt 224.8 lb

## 2024-03-02 DIAGNOSIS — R197 Diarrhea, unspecified: Secondary | ICD-10-CM

## 2024-03-02 DIAGNOSIS — R1084 Generalized abdominal pain: Secondary | ICD-10-CM

## 2024-03-02 DIAGNOSIS — A084 Viral intestinal infection, unspecified: Secondary | ICD-10-CM

## 2024-03-02 DIAGNOSIS — R112 Nausea with vomiting, unspecified: Secondary | ICD-10-CM | POA: Diagnosis not present

## 2024-03-02 MED ORDER — ONDANSETRON 4 MG PO TBDP: 4.0000 mg | ORAL_TABLET | Freq: Three times a day (TID) | ORAL | 0 refills | Status: AC | PRN

## 2024-03-02 MED ORDER — LOPERAMIDE HCL 2 MG PO CAPS: 2.0000 mg | ORAL_CAPSULE | ORAL | 0 refills | Status: DC | PRN

## 2024-03-02 NOTE — Progress Notes (Signed)
 Name: Joel [name]   Date of Visit: 03/02/24   Date of last visit with me: Visit date not found   CHIEF COMPLAINT:  Chief Complaint  Patient presents with   Acute Visit    Stomach pains, constant diarrhea, can't control it.        HPI:  Discussed the use of AI scribe software for clinical note transcription with the patient, who gave verbal consent to proceed.  History of Present Illness   Joel [name] is an 83 year old male with diabetes who presents with diarrhea and vomiting.  He has been experiencing diarrhea for approximately two weeks with no significant change in symptoms. The diarrhea is unpredictable, described as 'it just slides out whenever it feels like it.' He has a history of constipation but is currently experiencing diarrhea. He has not used Imodium  but was using a laxative similar to Miralax , which he has since stopped due to increased bowel movements.  He drinks about six large bottles of water daily and believes he is staying hydrated. He denies significant abdominal pain but mentions a sensation of tightness in his stomach. He experienced vomiting for the first time yesterday, describing it as severe. No persistent nausea, but he notes feeling 'real tired' and taking a long time to get going in the morning.  He has lost approximately four pounds recently, dropping from 230 to 224 pounds. He reports a decreased appetite and is not eating much currently. He lives alone and has not been in contact with anyone else who is sick.         OBJECTIVE:       06/01/2022   10:41 AM  Depression screen PHQ 2/9  Decreased Interest 0  Down, Depressed, Hopeless 0  PHQ - 2 Score 0  Altered sleeping 0  Tired, decreased energy 0  Change in appetite 0  Feeling bad or failure about yourself  0  Trouble concentrating 0  Moving slowly or fidgety/restless 0  Suicidal thoughts 0  PHQ-9 Score 0   Difficult doing work/chores Not difficult at all     Data saved  with a previous flowsheet row definition     BP Readings from Last 3 Encounters:  03/02/24 136/78  02/19/24 122/66  01/22/24 127/74    BP 136/78   Pulse 64   Wt 224 lb 12.8 oz (102 kg)   SpO2 97%   BMI 33.20 kg/m    Physical Exam          Physical Exam Constitutional:      Appearance: He is obese.  Abdominal:     General: Abdomen is flat.     Palpations: Abdomen is soft.     Tenderness: There is abdominal tenderness (Generalized Tenderness to palpation).     Comments: Increased bowel sounds   Neurological:     Mental Status: He is alert.     ASSESSMENT/PLAN:   Assessment & Plan Generalized abdominal pain  Viral gastroenteritis  Diarrhea, unspecified type  Nausea and vomiting, unspecified vomiting type    Assessment and Plan    Viral gastroenteritis with diarrhea, nausea, vomiting, and generalized abdominal pain Symptoms suggest viral gastroenteritis. No blood in stool or severe pain. No antibiotics needed. Further investigation if no improvement. - Prescribed loperamide  for diarrhea as needed. - Advised electrolyte-rich fluids intake. - Ordered abdominal x-ray. - Prescribed sublingual anti-nausea medication as needed. - Plan for abdominal CT scan if no improvement by Monday.     Dehydration - Discussed  concern with decreased in take. Patient is aware and will increase his intake of caloric fluid energy drinks as this will keep him hydrated and allow him some calories.     Ericca Labra A. Vita MD [facility]

## 2024-03-05 ENCOUNTER — Ambulatory Visit: Payer: Self-pay | Admitting: Family Medicine

## 2024-03-05 ENCOUNTER — Ambulatory Visit
Admission: RE | Admit: 2024-03-05 | Discharge: 2024-03-05 | Disposition: A | Discharge status: Home / Self Care | Source: Ambulatory Visit | Attending: Family Medicine | Admitting: Family Medicine

## 2024-03-05 DIAGNOSIS — R1084 Generalized abdominal pain: Secondary | ICD-10-CM

## 2024-03-05 DIAGNOSIS — K5641 Fecal impaction: Secondary | ICD-10-CM | POA: Diagnosis not present

## 2024-03-07 ENCOUNTER — Telehealth: Payer: Self-pay | Admitting: Internal Medicine

## 2024-03-07 ENCOUNTER — Ambulatory Visit: Admitting: Vascular Surgery

## 2024-03-07 ENCOUNTER — Ambulatory Visit (HOSPITAL_COMMUNITY)

## 2024-03-07 NOTE — Telephone Encounter (Signed)
 Copied from CRM #[id number]. Topic: Clinical - Lab/Test Results >> Mar 07, 2024  3:45 PM Myrick T wrote: Reason for CRM: patient said he was called right back after he was given his results. Please call patient back if this was not a call in error

## 2024-03-07 NOTE — Telephone Encounter (Signed)
 Patient informed of results.

## 2024-03-07 NOTE — Telephone Encounter (Signed)
 Copied from CRM #[id number]. Topic: Clinical - Lab/Test Results >> Mar 07, 2024 12:03 PM Wess RAMAN wrote: Reason for CRM: Patient would like a call back to discuss his imaing results with Dr. Vita Rushing #: [phone]

## 2024-03-07 NOTE — Telephone Encounter (Signed)
 3rd attempt to contact patient and go over imaging results

## 2024-03-12 ENCOUNTER — Ambulatory Visit

## 2024-03-12 DIAGNOSIS — I442 Atrioventricular block, complete: Secondary | ICD-10-CM | POA: Diagnosis not present

## 2024-03-13 LAB — CUP PACEART REMOTE DEVICE CHECK
Battery Remaining Longevity: 72 mo
Battery Remaining Percentage: 74 %
Battery Voltage: 2.99 V
Brady Statistic AP VP Percent: 76 %
Brady Statistic AP VS Percent: 1 %
Brady Statistic AS VP Percent: 23 %
Brady Statistic AS VS Percent: 1 %
Brady Statistic RA Percent Paced: 76 %
Brady Statistic RV Percent Paced: 99 %
Date Time Interrogation Session: 20251201022942
Implantable Lead Connection Status: 753985
Implantable Lead Connection Status: 753985
Implantable Lead Implant Date: 20230525
Implantable Lead Implant Date: 20230525
Implantable Lead Location: 753859
Implantable Lead Location: 753860
Implantable Pulse Generator Implant Date: 20230525
Lead Channel Impedance Value: 440 Ohm
Lead Channel Impedance Value: 480 Ohm
Lead Channel Pacing Threshold Amplitude: 0.75 V
Lead Channel Pacing Threshold Amplitude: 0.75 V
Lead Channel Pacing Threshold Pulse Width: 0.5 ms
Lead Channel Pacing Threshold Pulse Width: 0.5 ms
Lead Channel Sensing Intrinsic Amplitude: 11.9 mV
Lead Channel Sensing Intrinsic Amplitude: 4.6 mV
Lead Channel Setting Pacing Amplitude: 2 V
Lead Channel Setting Pacing Amplitude: 2.5 V
Lead Channel Setting Pacing Pulse Width: 0.5 ms
Lead Channel Setting Sensing Sensitivity: 2.5 mV
Pulse Gen Model: 2272
Pulse Gen Serial Number: 8084442

## 2024-03-15 ENCOUNTER — Ambulatory Visit: Payer: Self-pay | Admitting: Internal Medicine

## 2024-03-16 NOTE — Progress Notes (Signed)
 Remote PPM Transmission

## 2024-03-20 ENCOUNTER — Telehealth: Payer: Self-pay | Admitting: Medical

## 2024-03-20 ENCOUNTER — Ambulatory Visit: Payer: Self-pay

## 2024-03-20 DIAGNOSIS — Z0000 Encounter for general adult medical examination without abnormal findings: Secondary | ICD-10-CM | POA: Diagnosis not present

## 2024-03-20 DIAGNOSIS — G8929 Other chronic pain: Secondary | ICD-10-CM | POA: Diagnosis not present

## 2024-03-20 DIAGNOSIS — M545 Low back pain, unspecified: Secondary | ICD-10-CM | POA: Diagnosis not present

## 2024-03-20 DIAGNOSIS — M48062 Spinal stenosis, lumbar region with neurogenic claudication: Secondary | ICD-10-CM | POA: Diagnosis not present

## 2024-03-20 DIAGNOSIS — Z981 Arthrodesis status: Secondary | ICD-10-CM | POA: Diagnosis not present

## 2024-03-20 NOTE — Telephone Encounter (Signed)
 Patient is wanting to transfer from [place], [place] to Dr.Jha. He did not give a reason, is this ok?

## 2024-03-20 NOTE — Patient Instructions (Addendum)
 Joel [name],  Thank you for taking the time for your Medicare Wellness Visit. I appreciate your continued commitment to your health goals. Please review the care plan we discussed, and feel free to reach out if I can assist you further.  Please note that Annual Wellness Visits do not include a physical exam. Some assessments may be limited, especially if the visit was conducted virtually. If needed, we may recommend an in-person follow-up with your provider.  Ongoing Care Seeing your primary care provider every 3 to 6 months helps us  monitor your health and provide consistent, personalized care.   Referrals If a referral was made during today's visit and you haven't received any updates within two weeks, please contact the referred provider directly to check on the status.  Recommended Screenings:  Health Maintenance  Topic Date Due   Complete foot exam   02/18/2023   COVID-19 Vaccine (10 - 2025-26 season) 12/12/2023   Hemoglobin A1C  04/27/2024   Eye exam for diabetics  09/26/2024   Yearly kidney health urinalysis for diabetes  10/25/2024   Yearly kidney function blood test for diabetes  02/17/2025   Medicare Annual Wellness Visit  03/20/2025   DTaP/Tdap/Td vaccine (3 - Td or Tdap) 01/03/2033   Pneumococcal Vaccine for age over 44  Completed   Flu Shot  Completed   Zoster (Shingles) Vaccine  Completed   Meningitis B Vaccine  Aged Out       03/20/2024   11:07 AM  Advanced Directives  Does Patient Have a Medical Advance Directive? Yes  Type of Advance Directive Out of facility DNR (pink MOST or yellow form)    Vision: Annual vision screenings are recommended for early detection of glaucoma, cataracts, and diabetic retinopathy. These exams can also reveal signs of chronic conditions such as diabetes and high blood pressure.  Dental: Annual dental screenings help detect early signs of oral cancer, gum disease, and other conditions linked to overall health, including heart  disease and diabetes.  Please see the attached documents for additional preventive care recommendations.

## 2024-03-20 NOTE — Progress Notes (Signed)
 Chief Complaint  Patient presents with   Medicare Wellness     Subjective:   Joel [name] is a 83 y.o. male who presents for a Medicare Annual Wellness Visit.  Visit info / Clinical Intake: Medicare Wellness Visit Type:: Subsequent Annual Wellness Visit Persons participating in visit and providing information:: patient Medicare Wellness Visit Mode:: Telephone If telephone:: video declined Since this visit was completed virtually, some vitals may be partially provided or unavailable. Missing vitals are due to the limitations of the virtual format.: Unable to obtain vitals - no equipment If Telephone or Video please confirm:: I connected with patient using audio/video enable telemedicine. I verified patient identity with two identifiers, discussed telehealth limitations, and patient agreed to proceed. Patient Location:: home Provider Location:: office Interpreter Needed?: No Pre-visit prep was completed: yes AWV questionnaire completed by patient prior to visit?: no Living arrangements:: (!) lives alone Patient's Overall Health Status Rating: good Typical amount of pain: some Does pain affect daily life?: no Are you currently prescribed opioids?: no  Dietary Habits and Nutritional Risks How many meals a day?: (!) 1 Eats fruit and vegetables daily?: (!) no Most meals are obtained by: preparing own meals In the last 2 weeks, have you had any of the following?: none Diabetic:: (!) yes Any non-healing wounds?: no How often do you check your BS?: -- (once weekly) Would you like to be referred to a Nutritionist or for Diabetic Management? : no  Functional Status Activities of Daily Living (to include ambulation/medication): Independent Ambulation: Independent Medication Administration: Independent Home Management (perform basic housework or laundry): Independent Manage your own finances?: yes Primary transportation is: driving Concerns about vision?: no *vision screening  is required for WTM* Concerns about hearing?: no  Fall Screening Falls in the past year?: 0 Number of falls in past year: 0 Was there an injury with Fall?: 0 Fall Risk Category Calculator: 0 Patient Fall Risk Level: Low Fall Risk  Fall Risk Patient at Risk for Falls Due to: Medication side effect Fall risk Follow up: Falls prevention discussed; Falls evaluation completed  Home and Transportation Safety: All rugs have non-skid backing?: yes All stairs or steps have railings?: N/A, no stairs Grab bars in the bathtub or shower?: (!) no Have non-skid surface in bathtub or shower?: yes Good home lighting?: yes Regular seat belt use?: yes Hospital stays in the last year:: no  Cognitive Assessment Difficulty concentrating, remembering, or making decisions? : no Will 6CIT or Mini Cog be Completed: yes What year is it?: 0 points What month is it?: 0 points Give patient an address phrase to remember (5 components): [address] About what time is it?: 0 points Count backwards from 20 to 1: 0 points Say the months of the year in reverse: 2 points Repeat the address phrase from earlier: 10 points 6 CIT Score: 12 points  Advance Directives (For Healthcare) Does Patient Have a Medical Advance Directive?: Yes Type of Advance Directive: Out of facility DNR (pink MOST or yellow form) Out of facility DNR (pink MOST or yellow form) in Chart? (Ambulatory ONLY): Yes - validated most recent copy scanned in chart Would patient like information on creating a medical advance directive?: No - Patient declined  Reviewed/Updated  Reviewed/Updated: Reviewed All (Medical, Surgical, Family, Medications, Allergies, Care Teams, Patient Goals)    Allergies (verified) Topiramate, Atenolol , Lisinopril, and Tramadol -acetaminophen    Current Medications (verified) Outpatient Encounter Medications as of 03/20/2024  Medication Sig   abiraterone  acetate (ZYTIGA ) 250 MG  tablet Take 250 mg by mouth  daily. On an empty stomach   Accu-Chek FastClix Lancets MISC Test once daily   albuterol  (VENTOLIN  HFA) 108 (90 Base) MCG/ACT inhaler INHALE 2 PUFFS INTO THE LUNGS EVERY 6 HOURS AS NEEDED FOR WHEEZING OR SHORTNESS OF BREATH   amLODipine  (NORVASC ) 10 MG tablet TAKE 1 TABLET EVERY DAY   aspirin  EC 81 MG tablet Take 1 tablet (81 mg total) by mouth daily. Swallow whole.   brimonidine  (ALPHAGAN ) 0.2 % ophthalmic solution Place 1 drop into both eyes 2 (two) times daily.   chlorpheniramine-HYDROcodone  (TUSSIONEX) 10-8 MG/5ML Take 5 mLs by mouth every 12 (twelve) hours as needed for cough.   diclofenac  Sodium (VOLTAREN ) 1 % GEL Apply 2 g topically 2 (two) times daily as needed (back pain).   DULoxetine (CYMBALTA) 20 MG capsule Take one capsule daily for one week then increase to one capsule twice daily   ferrous gluconate  (FERGON) 324 MG tablet Take 1 tablet (324 mg total) by mouth daily with breakfast.   gabapentin  (NEURONTIN ) 600 MG tablet Take 600 mg by mouth 2 (two) times daily.   isosorbide  mononitrate (IMDUR ) 120 MG 24 hr tablet Take 1 tablet (120 mg total) by mouth daily.   latanoprost  (XALATAN ) 0.005 % ophthalmic solution Place 1 drop into both eyes at bedtime.   loperamide  (IMODIUM ) 2 MG capsule Take 1 capsule (2 mg total) by mouth as needed for diarrhea or loose stools.   nitroGLYCERIN  (NITROSTAT ) 0.4 MG SL tablet Place 1 tablet (0.4 mg total) under the tongue every 5 (five) minutes as needed for chest pain (max 3 doses).   omeprazole (PRILOSEC) 20 MG capsule Take 20 mg by mouth daily.   ondansetron  (ZOFRAN -ODT) 4 MG disintegrating tablet Take 1 tablet (4 mg total) by mouth every 8 (eight) hours as needed for nausea or vomiting.   pravastatin  (PRAVACHOL ) 40 MG tablet TAKE 1 TABLET EVERY EVENING   predniSONE  (DELTASONE ) 5 MG tablet Take 5 mg by mouth.   tamsulosin  (FLOMAX ) 0.4 MG CAPS capsule Take 0.4 mg by mouth every evening.   traMADol  (ULTRAM ) 50 MG tablet Take 50 mg by mouth 3 (three) times  daily as needed.   polyethylene glycol powder (GLYCOLAX /MIRALAX ) 17 GM/SCOOP powder Take 17 g by mouth daily. (Patient not taking: Reported on 03/20/2024)   No facility-administered encounter medications on file as of 03/20/2024.    History: Past Medical History:  Diagnosis Date   Alpha thalassemia trait    Atypical chest pain    CAD (coronary artery disease)    a. Multiple prior evaluations then 05/2012 s/p BMS to OM2. b. Nuc 11/2012 wnl. c. Cath 2015: patent stent, moderate LAD/RCA dz, treated medically.   Cancer [facility])    Cataract    CHEST PAIN UNSPECIFIED 09/13/2008   Qualifier: Diagnosis of  By: Buell, RN, Heather     CKD (chronic kidney disease), stage II    COPD (chronic obstructive pulmonary disease) (HCC)    Cough secondary to angiotensin converting enzyme inhibitor (ACE-I)    Diabetes mellitus (HCC) 09/07/2019   Diabetes mellitus without complication (HCC)    Diverticulosis    DVT (deep venous thrombosis) (HCC)    Family history of breast cancer in first degree relative    GERD (gastroesophageal reflux disease)    Headache(784.0)    Hx: of   History of blood transfusion    I've had 2; don't know what it was related to (11/09/2012)   HLD (hyperlipidemia)    HTN (hypertension)  Iron deficiency anemia    Lymphoproliferative disorder, low grade B cell (HCC) 05/21/2020   Microcytic anemia    PAF (paroxysmal atrial fibrillation) (HCC)    Peripheral vascular disease    a. L pop-tibial bypass 2011, followed by [facility].   Personal history of prostate cancer    Presence of permanent cardiac pacemaker    Rotator cuff injury    left arm; never repaired (11/09/2012)   Past Surgical History:  Procedure Laterality Date   CARDIOVASCULAR STRESS TEST  Aug. 2014   CATARACT EXTRACTION W/ INTRAOCULAR LENS IMPLANT Right ~ 2008   CIRCUMCISION     COLONOSCOPY     CORONARY ANGIOPLASTY WITH STENT PLACEMENT  2014   1 (11/09/2012)   ILIAC ARTERY ANEURYSM REPAIR     LEFT ATRIAL APPENDAGE  OCCLUSION N/A 09/02/2022   Procedure: LEFT ATRIAL APPENDAGE OCCLUSION;  Surgeon: Cindie Ole DASEN, MD;  Location: [facility] INVASIVE CV LAB;  Service: Cardiovascular;  Laterality: N/A;   LEFT HEART CATH  05-31-12   LEFT HEART CATH AND CORONARY ANGIOGRAPHY N/A 08/01/2019   Procedure: LEFT HEART CATH AND CORONARY ANGIOGRAPHY;  Surgeon: Verlin Lonni BIRCH, MD;  Location: [facility] INVASIVE CV LAB;  Service: Cardiovascular;  Laterality: N/A;   LEFT HEART CATHETERIZATION WITH CORONARY ANGIOGRAM N/A 06/01/2011   Procedure: LEFT HEART CATHETERIZATION WITH CORONARY ANGIOGRAM;  Surgeon: Debby BIRCH Como, MD;  Location[facility] CATH LAB;  Service: Cardiovascular;  Laterality: N/A;   LEFT HEART CATHETERIZATION WITH CORONARY ANGIOGRAM N/A 05/31/2012   Procedure: LEFT HEART CATHETERIZATION WITH CORONARY ANGIOGRAM;  Surgeon: Peter M Jordan, MD;  Location: [facility] CATH LAB;  Service: Cardiovascular;  Laterality: N/A;   LEFT HEART CATHETERIZATION WITH CORONARY ANGIOGRAM N/A 08/01/2013   Procedure: LEFT HEART CATHETERIZATION WITH CORONARY ANGIOGRAM;  Surgeon: Lonni BIRCH Verlin, MD;  Location: [facility] CATH LAB;  Service: Cardiovascular;  Laterality: N/A;   PACEMAKER IMPLANT N/A 09/03/2021   Procedure: PACEMAKER IMPLANT;  Surgeon: Waddell Danelle ORN, MD;  Location: [facility] INVASIVE CV LAB;  Service: Cardiovascular;  Laterality: N/A;   PERCUTANEOUS CORONARY STENT INTERVENTION (PCI-S)  05/31/2012   Procedure: PERCUTANEOUS CORONARY STENT INTERVENTION (PCI-S);  Surgeon: Peter M Jordan, MD;  Location: [facility] CATH LAB;  Service: Cardiovascular;;   PR VEIN BYPASS GRAFT,AORTO-FEM-POP Left 10/01/2009   left below knee popliteal artery to posterior tibial artery   SHOULDER ARTHROSCOPY WITH OPEN ROTATOR CUFF REPAIR AND DISTAL CLAVICLE ACROMINECTOMY Left 01/12/2013   Procedure: LEFT SHOULDER ARTHROSCOPY WITH DEBRIDEMENT OPEN DISTAL CLAVICLE RESECTION ,acromioplastyAND ROTATOR CUFF REPAIR;  Surgeon: ORN BIRCH Shari Mickey., MD;  Location: [facility] OR;  Service: Orthopedics;   Laterality: Left;   TEE WITHOUT CARDIOVERSION N/A 09/02/2022   Procedure: TRANSESOPHAGEAL ECHOCARDIOGRAM;  Surgeon: Cindie Ole DASEN, MD;  Location: [facility] INVASIVE CV LAB;  Service: Cardiovascular;  Laterality: N/A;   Family History  Problem Relation Age of Onset   Breast cancer Mother        dx 26s   Hyperlipidemia Mother    Hypertension Mother    COPD Father        deceased   Hyperlipidemia Father    Hypertension Father    Hypertension Sister    Cancer Brother 67       Unknown cancer, possibly stomach   Hypertension Brother    Cancer Brother 18       Unknown, possibly lung   Hypertension Daughter    Hypertension Son    Colon cancer Neg Hx        pt is unclear about family hx  Social History   Occupational History   Occupation: Retired  Tobacco Use   Smoking status: Former    Current packs/day: 0.00    Average packs/day: 0.5 packs/day for 52.0 years (26.0 ttl pk-yrs)    Types: Cigarettes    Start date: 05/26/1959    Quit date: 05/26/2011    Years since quitting: 12.8    Passive exposure: Past   Smokeless tobacco: Never  Vaping Use   Vaping status: Never Used  Substance and Sexual Activity   Alcohol use: No   Drug use: No   Sexual activity: Not on file   Tobacco Counseling Counseling given: Not Answered  SDOH Screenings   Food Insecurity: No Food Insecurity (03/20/2024)  Housing: Unknown (03/20/2024)  Transportation Needs: No Transportation Needs (03/20/2024)  Utilities: Not At Risk (03/20/2024)  Alcohol Screen: Low Risk  (03/20/2024)  Depression (PHQ2-9): Low Risk  (03/20/2024)  Financial Resource Strain: Low Risk  (03/20/2024)  Physical Activity: Inactive (03/20/2024)  Social Connections: Socially Isolated (03/20/2024)  Stress: No Stress Concern Present (03/20/2024)  Tobacco Use: Medium Risk (03/20/2024)  Health Literacy: Adequate Health Literacy (03/20/2024)   See flowsheets for full screening details  Depression Screen PHQ 2 & 9 Depression Scale- Over the past  2 weeks, how often have you been bothered by any of the following problems? Little interest or pleasure in doing things: 0 Feeling down, depressed, or hopeless (PHQ Adolescent also includes...irritable): 0 PHQ-2 Total Score: 0 Trouble falling or staying asleep, or sleeping too much: 0 Feeling tired or having little energy: 1 Poor appetite or overeating (PHQ Adolescent also includes...weight loss): 0 Feeling bad about yourself - or that you are a failure or have let yourself or your family down: 0 Trouble concentrating on things, such as reading the newspaper or watching television (PHQ Adolescent also includes...like school work): 0 Moving or speaking so slowly that other people could have noticed. Or the opposite - being so fidgety or restless that you have been moving around a lot more than usual: 0 Thoughts that you would be better off dead, or of hurting yourself in some way: 0 PHQ-9 Total Score: 1 If you checked off any problems, how difficult have these problems made it for you to do your work, take care of things at home, or get along with other people?: Not difficult at all  Depression Treatment Depression Interventions/Treatment : EYV7-0 Score <4 Follow-up Not Indicated     Goals Addressed             This Visit's Progress    Patient Stated       03/20/2024, denies goals             Objective:    Today's Vitals   There is no height or weight on file to calculate BMI.  Hearing/Vision screen Hearing Screening - Comments:: Denies hearing issues Vision Screening - Comments:: Regular eye exams, [facility] Immunizations and Health Maintenance Health Maintenance  Topic Date Due   FOOT EXAM  02/18/2023   COVID-19 Vaccine (10 - 2025-26 season) 12/12/2023   HEMOGLOBIN A1C  04/27/2024   OPHTHALMOLOGY EXAM  09/26/2024   Diabetic kidney evaluation - Urine ACR  10/25/2024   Diabetic kidney evaluation - eGFR measurement  02/17/2025   Medicare Annual Wellness (AWV)   03/20/2025   DTaP/Tdap/Td (3 - Td or Tdap) 01/03/2033   Pneumococcal Vaccine: 50+ Years  Completed   Influenza Vaccine  Completed   Zoster Vaccines- Shingrix  Completed   Meningococcal B  Vaccine  Aged Out        Assessment/Plan:  This is a routine wellness examination for Khyren.  Patient Care Team: Tysinger, Alm RAMAN, PA-C as PCP - General (Family Medicine) Verlin Lonni BIRCH, MD as PCP - Cardiology (Cardiology) Cindie Ole DASEN, MD as PCP - Electrophysiology (Cardiology) Verlin Lonni BIRCH, MD as Consulting Physician (Cardiology) Clinic, Bonni Refugia Mechanic, Sharyne MATSU, [facility] (Inactive) as Pharmacist (Pharmacist) Waddell Danelle ORN, MD as Consulting Physician (Cardiology) Octavia Lonni, MD as Consulting Physician (Ophthalmology) Oneita Na, MD as Referring Physician (Specialist) Eliza Lonni RAMAN, MD (Inactive) as Consulting Physician (Vascular Surgery) Janit Thresa HERO, DPM as Consulting Physician (Podiatry) Rosellen Lot, MD as Referring Physician (Hematology and Oncology)  I have personally reviewed and noted the following in the patient's chart:   Medical and social history Use of alcohol, tobacco or illicit drugs  Current medications and supplements including opioid prescriptions. Functional ability and status Nutritional status Physical activity Advanced directives List of other physicians Hospitalizations, surgeries, and ER visits in previous 12 months Vitals Screenings to include cognitive, depression, and falls Referrals and appointments  No orders of the defined types were placed in this encounter.  In addition, I have reviewed and discussed with patient certain preventive protocols, quality metrics, and best practice recommendations. A written personalized care plan for preventive services as well as general preventive health recommendations were provided to patient.   Ardella FORBES Dawn, LPN   [id number]   Return in 1 year (on  03/20/2025).  After Visit Summary: (Pick Up) Due to this being a telephonic visit, with patients personalized plan was offered to patient and patient has requested to Pick up at office.  Nurse Notes: none

## 2024-03-21 ENCOUNTER — Ambulatory Visit: Admitting: Internal Medicine

## 2024-03-21 ENCOUNTER — Ambulatory Visit (HOSPITAL_COMMUNITY): Admission: RE | Admit: 2024-03-21 | Discharge: 2024-03-21 | Attending: Surgery | Admitting: Surgery

## 2024-03-21 ENCOUNTER — Encounter: Payer: Self-pay | Admitting: Vascular Surgery

## 2024-03-21 ENCOUNTER — Ambulatory Visit: Admitting: Vascular Surgery

## 2024-03-21 VITALS — BP 126/70 | HR 76 | Temp 97.9°F | Ht 69.0 in | Wt 218.0 lb

## 2024-03-21 DIAGNOSIS — I712 Thoracic aortic aneurysm, without rupture, unspecified: Secondary | ICD-10-CM

## 2024-03-21 DIAGNOSIS — I70219 Atherosclerosis of native arteries of extremities with intermittent claudication, unspecified extremity: Secondary | ICD-10-CM | POA: Diagnosis not present

## 2024-03-21 DIAGNOSIS — I723 Aneurysm of iliac artery: Secondary | ICD-10-CM | POA: Diagnosis not present

## 2024-03-21 NOTE — Progress Notes (Signed)
 [name], male   DOB: 21-Jul-1940, 83 y.o.   MRN: [id number]  Reason for Consult: Follow-up   Referred by Bulah Alm RAMAN, PA-C  Subjective:     HPI:  [name] is a 83 y.o. male with remote history of endovascular aneurysm repair around 2011 followed by Dr. Eliza for proximal pseudoaneurysmal degeneration.  He also has history of left popliteal to posterior tibial artery bypass graft by Dr. Eliza in the past.  He does have stable back pain no new abdominal pain and no new back pain.  He has been in his usual state of health without complaints today.  Past Medical History:  Diagnosis Date   Alpha thalassemia trait    Atypical chest pain    CAD (coronary artery disease)    a. Multiple prior evaluations then 05/2012 s/p BMS to OM2. b. Nuc 11/2012 wnl. c. Cath 2015: patent stent, moderate LAD/RCA dz, treated medically.   Cancer [facility])    Cataract    CHEST PAIN UNSPECIFIED 09/13/2008   Qualifier: Diagnosis of  By: Buell, RN, Heather     CKD (chronic kidney disease), stage II    COPD (chronic obstructive pulmonary disease) (HCC)    Cough secondary to angiotensin converting enzyme inhibitor (ACE-I)    Diabetes mellitus (HCC) 09/07/2019   Diabetes mellitus without complication (HCC)    Diverticulosis    DVT (deep venous thrombosis) (HCC)    Family history of breast cancer in first degree relative    GERD (gastroesophageal reflux disease)    Headache(784.0)    Hx: of   History of blood transfusion    I've had 2; don't know what it was related to (11/09/2012)   HLD (hyperlipidemia)    HTN (hypertension)    Iron deficiency anemia    Lymphoproliferative disorder, low grade B cell (HCC) 05/21/2020   Microcytic anemia    PAF (paroxysmal atrial fibrillation) (HCC)    Peripheral vascular disease    a. L pop-tibial bypass 2011, followed by [facility].   Personal history of prostate cancer    Presence of permanent cardiac pacemaker    Rotator cuff injury     left arm; never repaired (11/09/2012)   Family History  Problem Relation Age of Onset   Breast cancer Mother        dx 13s   Hyperlipidemia Mother    Hypertension Mother    COPD Father        deceased   Hyperlipidemia Father    Hypertension Father    Hypertension Sister    Cancer Brother 72       Unknown cancer, possibly stomach   Hypertension Brother    Cancer Brother 71       Unknown, possibly lung   Hypertension Daughter    Hypertension Son    Colon cancer Neg Hx        pt is unclear about family hx   Past Surgical History:  Procedure Laterality Date   CARDIOVASCULAR STRESS TEST  Aug. 2014   CATARACT EXTRACTION W/ INTRAOCULAR LENS IMPLANT Right ~ 2008   CIRCUMCISION     COLONOSCOPY     CORONARY ANGIOPLASTY WITH STENT PLACEMENT  2014   1 (11/09/2012)   ILIAC ARTERY ANEURYSM REPAIR     LEFT ATRIAL APPENDAGE OCCLUSION N/A 09/02/2022   Procedure: LEFT ATRIAL APPENDAGE OCCLUSION;  Surgeon: Cindie Ole DASEN, MD;  Location: [facility]VAS[facility];  Service: Cardiovascular;  Laterality: N/A;   LEFT HEART CATH  05-31-12   LEFT HEART CATH AND CORONARY ANGIOGRAPHY N/A 08/01/2019   Procedure: LEFT HEART CATH AND CORONARY ANGIOGRAPHY;  Surgeon: Verlin Lonni BIRCH, MD;  Location: [facility];  Service: Cardiovascular;  Laterality: N/A;   LEFT HEART CATHETERIZATION WITH CORONARY ANGIOGRAM N/A 06/01/2011   Procedure: LEFT HEART CATHETERIZATION WITH CORONARY ANGIOGRAM;  Surgeon: Debby BIRCH Como, MD;  Location[facility] CATH LAB;  Service: Cardiovascular;  Laterality: N/A;   LEFT HEART CATHETERIZATION WITH CORONARY ANGIOGRAM N/A 05/31/2012   Procedure: LEFT HEART CATHETERIZATION WITH CORONARY ANGIOGRAM;  Surgeon: Peter M Jordan, MD;  Location: [facility] CATH LAB;  Service: Cardiovascular;  Laterality: N/A;   LEFT HEART CATHETERIZATION WITH CORONARY ANGIOGRAM N/A 08/01/2013   Procedure: LEFT HEART CATHETERIZATION WITH CORONARY ANGIOGRAM;  Surgeon: Lonni BIRCH Verlin, MD;  Location: [facility] CATH  LAB;  Service: Cardiovascular;  Laterality: N/A;   PACEMAKER IMPLANT N/A 09/03/2021   Procedure: PACEMAKER IMPLANT;  Surgeon: Waddell Danelle ORN, MD;  Location: [facility];  Service: Cardiovascular;  Laterality: N/A;   PERCUTANEOUS CORONARY STENT INTERVENTION (PCI-S)  05/31/2012   Procedure: PERCUTANEOUS CORONARY STENT INTERVENTION (PCI-S);  Surgeon: Peter M Jordan, MD;  Location: [facility] CATH LAB;  Service: Cardiovascular;;   PR VEIN BYPASS GRAFT,AORTO-FEM-POP Left 10/01/2009   left below knee popliteal artery to posterior tibial artery   SHOULDER ARTHROSCOPY WITH OPEN ROTATOR CUFF REPAIR AND DISTAL CLAVICLE ACROMINECTOMY Left 01/12/2013   Procedure: LEFT SHOULDER ARTHROSCOPY WITH DEBRIDEMENT OPEN DISTAL CLAVICLE RESECTION ,acromioplastyAND ROTATOR CUFF REPAIR;  Surgeon: ORN BIRCH Shari Mickey., MD;  Location: [facility] OR;  Service: Orthopedics;  Laterality: Left;   TEE WITHOUT CARDIOVERSION N/A 09/02/2022   Procedure: TRANSESOPHAGEAL ECHOCARDIOGRAM;  Surgeon: Cindie Ole DASEN, MD;  Location: [facility] INVASIVE CV LAB;  Service: Cardiovascular;  Laterality: N/A;    Short Social History:  Social History   Tobacco Use   Smoking status: Former    Current packs/day: 0.00    Average packs/day: 0.5 packs/day for 52.0 years (26.0 ttl pk-yrs)    Types: Cigarettes    Start date: 05/26/1959    Quit date: 05/26/2011    Years since quitting: 12.8    Passive exposure: Past   Smokeless tobacco: Never  Substance Use Topics   Alcohol use: No    Allergies  Allergen Reactions   Topiramate Shortness Of Breath and Other (See Comments)     leg cramps   Atenolol  Other (See Comments)    bradycardia   Lisinopril Cough   Tramadol -Acetaminophen  Cough    Tolerates plain Tramadol     Current Outpatient Medications  Medication Sig Dispense Refill   abiraterone  acetate (ZYTIGA ) 250 MG tablet Take 250 mg by mouth daily. On an empty stomach     Accu-Chek FastClix Lancets MISC Test once daily 102 each 12   albuterol  (VENTOLIN   HFA) 108 (90 Base) MCG/ACT inhaler INHALE 2 PUFFS INTO THE LUNGS EVERY 6 HOURS AS NEEDED FOR WHEEZING OR SHORTNESS OF BREATH 6.7 g 1   amLODipine  (NORVASC ) 10 MG tablet TAKE 1 TABLET EVERY DAY 90 tablet 1   aspirin  EC 81 MG tablet Take 1 tablet (81 mg total) by mouth daily. Swallow whole. 90 tablet 3   brimonidine  (ALPHAGAN ) 0.2 % ophthalmic solution Place 1 drop into both eyes 2 (two) times daily.     chlorpheniramine-HYDROcodone  (TUSSIONEX) 10-8 MG/5ML Take 5 mLs by mouth every 12 (twelve) hours as needed for cough. 40 mL 0   diclofenac  Sodium (VOLTAREN ) 1 % GEL Apply 2 g topically 2 (two) times daily  as needed (back pain).     DULoxetine (CYMBALTA) 20 MG capsule Take one capsule daily for one week then increase to one capsule twice daily     ferrous gluconate  (FERGON) 324 MG tablet Take 1 tablet (324 mg total) by mouth daily with breakfast. 30 tablet 2   gabapentin  (NEURONTIN ) 600 MG tablet Take 600 mg by mouth 2 (two) times daily.     isosorbide  mononitrate (IMDUR ) 120 MG 24 hr tablet Take 1 tablet (120 mg total) by mouth daily. 90 tablet 3   latanoprost  (XALATAN ) 0.005 % ophthalmic solution Place 1 drop into both eyes at bedtime.     loperamide  (IMODIUM ) 2 MG capsule Take 1 capsule (2 mg total) by mouth as needed for diarrhea or loose stools. 30 capsule 0   nitroGLYCERIN  (NITROSTAT ) 0.4 MG SL tablet Place 1 tablet (0.4 mg total) under the tongue every 5 (five) minutes as needed for chest pain (max 3 doses). 75 tablet 2   omeprazole (PRILOSEC) 20 MG capsule Take 20 mg by mouth daily.     ondansetron  (ZOFRAN -ODT) 4 MG disintegrating tablet Take 1 tablet (4 mg total) by mouth every 8 (eight) hours as needed for nausea or vomiting. 20 tablet 0   polyethylene glycol powder (GLYCOLAX /MIRALAX ) 17 GM/SCOOP powder Take 17 g by mouth daily. 765 g 2   pravastatin  (PRAVACHOL ) 40 MG tablet TAKE 1 TABLET EVERY EVENING 90 tablet 1   predniSONE  (DELTASONE ) 5 MG tablet Take 5 mg by mouth.     tamsulosin   (FLOMAX ) 0.4 MG CAPS capsule Take 0.4 mg by mouth every evening.     traMADol  (ULTRAM ) 50 MG tablet Take 50 mg by mouth 3 (three) times daily as needed.     No current facility-administered medications for this visit.    Review of Systems  Constitutional:  Constitutional negative. HENT: HENT negative.  Eyes: Eyes negative.  Cardiovascular: Positive for leg swelling.  GI: Gastrointestinal negative.  Musculoskeletal: Positive for back pain.  Neurological: Neurological negative. Hematologic: Hematologic/lymphatic negative.  Psychiatric: Psychiatric negative.        Objective:  Objective   Vitals:   03/21/24 1054  BP: 126/70  Pulse: 76  Temp: 97.9 F (36.6 C)  Weight: 218 lb (98.9 kg)  Height: 5' 9 (1.753 m)   Body mass index is 32.19 kg/m.  Physical Exam HENT:     Head: Normocephalic.     Nose: Nose normal.  Eyes:     Pupils: Pupils are equal, round, and reactive to light.  Cardiovascular:     Rate and Rhythm: Normal rate.     Pulses:          Femoral pulses are 2+ on the right side and 2+ on the left side. Musculoskeletal:        General: Normal range of motion.     Right lower leg: No edema.     Left lower leg: Edema present.  Skin:    Capillary Refill: Capillary refill takes less than 2 seconds.  Neurological:     General: No focal deficit present.     Mental Status: He is alert.  Psychiatric:        Mood and Affect: Mood normal.     Data: CT IMPRESSION: 1. Stable 4.4 cm ascending thoracic aortic aneurysm at the aortic root. No thoracic aortic dissection. 2. No central pulmonary embolism. 3. Aortobiiliac stent. Stable excluded 4.3 cm right common iliac artery aneurysm. 4. No acute findings.       Assessment/Plan:  83 year old male with history as above.  Plan will be to repeat CT angio in 1 year to evaluate proximal aortic degeneration which appears stable comparing CT from 2 years ago and most recently.  He will also need follow-up  evaluation of his left lower extremity bypass graft with ABIs.  We reviewed all of his imaging today he demonstrates good understanding with plans for follow-up in 1 year.     Penne Lonni Colorado MD [facility]

## 2024-03-25 ENCOUNTER — Emergency Department (HOSPITAL_COMMUNITY)

## 2024-03-25 ENCOUNTER — Emergency Department (HOSPITAL_COMMUNITY)
Admission: EM | Admit: 2024-03-25 | Discharge: 2024-03-25 | Disposition: A | Discharge status: Home / Self Care | Attending: Emergency Medicine | Admitting: Emergency Medicine

## 2024-03-25 ENCOUNTER — Encounter (HOSPITAL_COMMUNITY): Payer: Self-pay | Admitting: *Deleted

## 2024-03-25 ENCOUNTER — Other Ambulatory Visit: Payer: Self-pay

## 2024-03-25 DIAGNOSIS — R55 Syncope and collapse: Secondary | ICD-10-CM

## 2024-03-25 DIAGNOSIS — R531 Weakness: Secondary | ICD-10-CM | POA: Diagnosis not present

## 2024-03-25 DIAGNOSIS — R519 Headache, unspecified: Secondary | ICD-10-CM | POA: Diagnosis not present

## 2024-03-25 DIAGNOSIS — Z7982 Long term (current) use of aspirin: Secondary | ICD-10-CM | POA: Diagnosis not present

## 2024-03-25 DIAGNOSIS — R197 Diarrhea, unspecified: Secondary | ICD-10-CM | POA: Diagnosis not present

## 2024-03-25 LAB — CBG MONITORING, ED: Glucose-Capillary: 104 mg/dL — ABNORMAL HIGH (ref 70–99)

## 2024-03-25 LAB — CBC
HCT: 34.6 % — ABNORMAL LOW (ref 39.0–52.0)
Hemoglobin: 10.3 g/dL — ABNORMAL LOW (ref 13.0–17.0)
MCH: 21 pg — ABNORMAL LOW (ref 26.0–34.0)
MCHC: 29.8 g/dL — ABNORMAL LOW (ref 30.0–36.0)
MCV: 70.6 fL — ABNORMAL LOW (ref 80.0–100.0)
Platelets: 189 K/uL (ref 150–400)
RBC: 4.9 MIL/uL (ref 4.22–5.81)
RDW: 18.5 % — ABNORMAL HIGH (ref 11.5–15.5)
WBC: 5.9 K/uL (ref 4.0–10.5)
nRBC: 0 % (ref 0.0–0.2)

## 2024-03-25 LAB — TROPONIN I (HIGH SENSITIVITY)
Troponin I (High Sensitivity): 11 ng/L (ref <18)
Troponin I (High Sensitivity): 10 ng/L (ref <18)

## 2024-03-25 LAB — COMPREHENSIVE METABOLIC PANEL WITH GFR
ALT: 12 U/L (ref 0–44)
AST: 15 U/L (ref 15–41)
Albumin: 3.4 g/dL — ABNORMAL LOW (ref 3.5–5.0)
Alkaline Phosphatase: 58 U/L (ref 38–126)
Anion gap: 12 (ref 5–15)
BUN: 13 mg/dL (ref 8–23)
CO2: 26 mmol/L (ref 22–32)
Calcium: 8.6 mg/dL — ABNORMAL LOW (ref 8.9–10.3)
Chloride: 100 mmol/L (ref 98–111)
Creatinine, Ser: 1.12 mg/dL (ref 0.61–1.24)
GFR, Estimated: >60 mL/min (ref >60)
Glucose, Bld: 104 mg/dL — ABNORMAL HIGH (ref 70–99)
Potassium: 3.7 mmol/L (ref 3.5–5.1)
Sodium: 138 mmol/L (ref 135–145)
Total Bilirubin: 0.2 mg/dL (ref 0.0–1.2)
Total Protein: 6.5 g/dL (ref 6.5–8.1)

## 2024-03-25 MED ORDER — KETOROLAC TROMETHAMINE 30 MG/ML IJ SOLN
30.0000 mg | Freq: Once | INTRAMUSCULAR | Status: AC
  Administered 2024-03-25: 30 mg via INTRAVENOUS
  Filled 2024-03-25: qty 1

## 2024-03-25 MED ORDER — LACTATED RINGERS IV BOLUS
500.0000 mL | Freq: Once | INTRAVENOUS | Status: AC
  Administered 2024-03-25: 500 mL via INTRAVENOUS

## 2024-03-25 NOTE — ED Notes (Signed)
 Patient transported to CT

## 2024-03-25 NOTE — ED Notes (Signed)
 Spoke with [employer] regarding the interrogation result of the pt's pacemaker. It was completed around 1330 hrs (approx). Made charge nurse and Provider aware via secure chat that the local rep for [employer] with contact us  with report.

## 2024-03-25 NOTE — ED Provider Notes (Signed)
 [name]   CSN: [id number] Arrival date & time: 03/25/24  1156     Patient presents with: Near Syncope   [name] is a 83 y.o. male.   HPI 83 year old male presents with near syncope and generalized weakness.  He was singing in the choir and felt fine but when he sat down he started to feel all over weak.  He stayed in a chair and was attended to by staff and eventually started feeling better.  He never passed out.  He denies any headache today, chest pain, shortness of breath or recent illness.  He does have chronic diarrhea.  He has been having on and off headaches that are mild to moderate and coming and going for the past week or so.  No thunderclap headache.  No vomiting.  Right now he feels fine.  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  Prior to Admission medications  Medication Sig Start Date End Date Taking? Authorizing Provider  abiraterone  acetate (ZYTIGA ) 250 MG tablet Take 250 mg by mouth daily. On an empty stomach 02/17/18   [provider]  Accu-Chek FastClix Lancets MISC Test once daily 11/23/18   Henson, Vickie L, NP-C  albuterol  (VENTOLIN  HFA) 108 (90 Base) MCG/ACT inhaler INHALE 2 PUFFS INTO THE LUNGS EVERY 6 HOURS AS NEEDED FOR WHEEZING OR SHORTNESS OF BREATH 02/27/24   Tysinger, Alm RAMAN, PA-C  amLODipine  (NORVASC ) 10 MG tablet TAKE 1 TABLET EVERY DAY 11/23/23   Verlin Lonni BIRCH, MD  aspirin  EC 81 MG tablet Take 1 tablet (81 mg total) by mouth daily. Swallow whole. 03/17/22   Verlin Lonni BIRCH, MD  brimonidine  (ALPHAGAN ) 0.2 % ophthalmic solution Place 1 drop into both eyes 2 (two) times daily. 05/09/20   [provider]  chlorpheniramine-HYDROcodone  (TUSSIONEX) 10-8 MG/5ML Take 5 mLs by mouth every 12 (twelve) hours as needed for cough. 02/28/24   Tysinger, Alm RAMAN, PA-C  diclofenac  Sodium (VOLTAREN ) 1 % GEL Apply 2 g topically 2 (two) times daily as needed  (back pain). 06/16/20   [provider]  DULoxetine (CYMBALTA) 20 MG capsule Take one capsule daily for one week then increase to one capsule twice daily 10/24/23   [provider]  ferrous gluconate  (FERGON) 324 MG tablet Take 1 tablet (324 mg total) by mouth daily with breakfast. 05/13/23   Tysinger, Alm RAMAN, PA-C  gabapentin  (NEURONTIN ) 600 MG tablet Take 600 mg by mouth 2 (two) times daily.    [provider]  isosorbide  mononitrate (IMDUR ) 120 MG 24 hr tablet Take 1 tablet (120 mg total) by mouth daily. 02/21/23   Verlin Lonni BIRCH, MD  latanoprost  (XALATAN ) 0.005 % ophthalmic solution Place 1 drop into both eyes at bedtime. 02/28/20   [provider]  loperamide  (IMODIUM ) 2 MG capsule Take 1 capsule (2 mg total) by mouth as needed for diarrhea or loose stools. 03/02/24   Jha, Panav, MD  nitroGLYCERIN  (NITROSTAT ) 0.4 MG SL tablet Place 1 tablet (0.4 mg total) under the tongue every 5 (five) minutes as needed for chest pain (max 3 doses). 02/19/22   Verlin Lonni BIRCH, MD  omeprazole (PRILOSEC) 20 MG capsule Take 20 mg by mouth daily.    [provider]  ondansetron  (ZOFRAN -ODT) 4 MG disintegrating tablet Take 1 tablet (4 mg total) by mouth every 8 (eight) hours as needed for nausea or vomiting. 03/02/24   Jha, Panav, MD  polyethylene glycol powder (GLYCOLAX /MIRALAX ) 17 GM/SCOOP powder  Take 17 g by mouth daily. 05/13/23   Tysinger, Alm RAMAN, PA-C  pravastatin  (PRAVACHOL ) 40 MG tablet TAKE 1 TABLET EVERY EVENING 11/23/23   McAlhany, Christopher D, MD  predniSONE  (DELTASONE ) 5 MG tablet Take 5 mg by mouth. 02/01/24   [provider]  tamsulosin  (FLOMAX ) 0.4 MG CAPS capsule Take 0.4 mg by mouth every evening.    [provider]  traMADol  (ULTRAM ) 50 MG tablet Take 50 mg by mouth 3 (three) times daily as needed.    [provider]    Allergies: Topiramate, Atenolol , Lisinopril, and Tramadol -acetaminophen     Review of  Systems  Constitutional:  Negative for fever.  Respiratory:  Negative for shortness of breath.   Cardiovascular:  Negative for chest pain.  Gastrointestinal:  Positive for diarrhea. Negative for vomiting.  Neurological:  Positive for weakness and headaches. Negative for syncope.    Updated Vital Signs BP (!) 126/59   Pulse 69   Temp 97.6 F (36.4 C) (Oral)   Resp 17   Ht 5' 9 (1.753 m)   Wt 98.9 kg   SpO2 100%   BMI 32.20 kg/m   Physical Exam Vitals and nursing note reviewed.  Constitutional:      General: He is not in acute distress.    Appearance: He is well-developed. He is not ill-appearing or diaphoretic.  HENT:     Head: Normocephalic and atraumatic.  Eyes:     Extraocular Movements: Extraocular movements intact.  Cardiovascular:     Rate and Rhythm: Normal rate and regular rhythm.     Heart sounds: Normal heart sounds.  Pulmonary:     Effort: Pulmonary effort is normal.     Breath sounds: Normal breath sounds. No wheezing.  Abdominal:     General: There is no distension.  Skin:    General: Skin is warm and dry.  Neurological:     Mental Status: He is alert.     Comments: Awake, alert, clear speech. 5/5 strength in all 4 extremities.     (all labs ordered are listed, but only abnormal results are displayed) Labs Reviewed  COMPREHENSIVE METABOLIC PANEL WITH GFR - Abnormal; Notable for the following components:      Result Value   Glucose, Bld 104 (*)    Calcium  8.6 (*)    Albumin 3.4 (*)    All other components within normal limits  CBC - Abnormal; Notable for the following components:   Hemoglobin 10.3 (*)    HCT 34.6 (*)    MCV 70.6 (*)    MCH 21.0 (*)    MCHC 29.8 (*)    RDW 18.5 (*)    All other components within normal limits  CBG MONITORING, ED - Abnormal; Notable for the following components:   Glucose-Capillary 104 (*)    All other components within normal limits  URINALYSIS, ROUTINE W REFLEX MICROSCOPIC  CBG MONITORING, ED  TROPONIN I  (HIGH SENSITIVITY)  TROPONIN I (HIGH SENSITIVITY)    EKG: EKG Interpretation Date/Time:  Sunday March 25 2024 12:14:10 EST Ventricular Rate:  60 PR Interval:  101 QRS Duration:  123 QT Interval:  489 QTC Calculation: 489 R Axis:   48  Text Interpretation: Sinus rhythm Short PR interval Left bundle branch block Confirmed by Freddi Hamilton [phone]) on 03/25/2024 12:28:18 PM  Radiology: [facility] Chest Port 1 View Result Date: 03/25/2024 CLINICAL DATA:  syncope EXAM: PORTABLE CHEST 1 VIEW COMPARISON:  January 20, 2024 FINDINGS: The cardiomediastinal silhouette is unchanged in contour.LEFT chest  cardiac pacing device. Atherosclerotic calcifications. No pleural effusion. No pneumothorax. No acute pleuroparenchymal abnormality. IMPRESSION: No acute cardiopulmonary abnormality. Electronically Signed   By: Corean Salter M.D.   On: 03/25/2024 13:08   CT HEAD WO CONTRAST Result Date: 03/25/2024 CLINICAL DATA:  Severe headache. EXAM: CT HEAD WITHOUT CONTRAST TECHNIQUE: Contiguous axial images were obtained from the base of the skull through the vertex without intravenous contrast. RADIATION DOSE REDUCTION: This exam was performed according to the departmental dose-optimization program which includes automated exposure control, adjustment of the mA and/or kV according to patient size and/or use of iterative reconstruction technique. COMPARISON:  05/10/2023 FINDINGS: Brain: No evidence of intracranial hemorrhage, acute infarction, hydrocephalus, extra-axial collection, or mass lesion/mass effect. Stable right temporal and posterior frontal lobe encephalomalacia. Stable mild chronic small vessel disease. Vascular:  No hyperdense vessel or other acute findings. Skull: No evidence of fracture or other significant bone abnormality. Sinuses/Orbits:  No acute findings. Other: None. IMPRESSION: No acute intracranial abnormality. Stable right temporal and posterior frontal lobe encephalomalacia. Stable mild  chronic small vessel disease. Electronically Signed   By: Norleen DELENA Kil M.D.   On: 03/25/2024 13:04     Procedures   Medications Ordered in the ED  lactated ringers  bolus 500 mL (0 mLs Intravenous Stopped 03/25/24 1358)                                    Medical Decision Making Amount and/or Complexity of Data Reviewed Labs: ordered.    Details: Normal troponin Radiology: ordered and independent interpretation performed.    Details: No head bleed. ECG/medicine tests: ordered and independent interpretation performed.    Details: No ischemia   Patient presents with near syncope/generalized weakness after prolonged standing.  No actual chest pain or shortness of breath.  On and off mild headache for about a week but no thunderclap component.  CT head will be obtained but there is no new headache during the feeling of weakness.  He will be given some gentle fluids.  Initial labs are unremarkable.  Currently waiting on pacemaker interrogation.  Care transferred to Dr. Cottie.     Final diagnoses:  None    ED Discharge Orders     None          Freddi Hamilton, MD 03/25/24 1529

## 2024-03-25 NOTE — ED Triage Notes (Addendum)
 PT here via [facility] for near-syncope.  Pt was singing in church, became dizzy and diaphoretic.  Was able to lower himself to the seat and did not lose consciousness.  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  HR 60 paced and cbg 134.  PT ao x 4.  BP increased to 122/62.  Pt does not know type of pacemaker, but his has had it for 2 years.  PT states he has been having headaches for a week.  Went and saw Dr Oneita last week who is a headache Dr., but has not picked the meds up yet.

## 2024-03-25 NOTE — Discharge Instructions (Addendum)
 Please schedule a follow-up appointment with your cardiologist or your primary care doctor this week for the episode you had today.  Your workup is reassuring in the emergency department, including your heart monitoring here in the ER.  If you do have more episodes of lightheadedness, or loss of consciousness, or chest pain, difficulty breathing, or any other emergency concerns, you should call 911 or return to the ER.

## 2024-03-25 NOTE — ED Provider Notes (Signed)
 83 yo male here with episode of near syncope while at church Labs thus far unremarkable Hx of 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 Radiology: ordered.  Risk Prescription drug management.   Patient reassessed.  He has been here for 6 hours observation now with stable blood pressure, asymptomatic.  His telemetry has shown a stable paced rhythm.  His device review by the representative does not show any dangerous high-grade arrhythmia.  Patient is wanting to go home and I think at this time reasonably stable for discharge.  He can follow-up with his cardiologist.  He was ambulated steadily in the ED       Suleiman Finigan, Donnice PARAS, MD 03/25/24 [id number]

## 2024-03-30 ENCOUNTER — Encounter: Payer: Self-pay | Admitting: Family Medicine

## 2024-03-30 ENCOUNTER — Ambulatory Visit: Admitting: Family Medicine

## 2024-03-30 VITALS — BP 130/66 | HR 110 | Wt 219.2 lb

## 2024-03-30 DIAGNOSIS — R0982 Postnasal drip: Secondary | ICD-10-CM | POA: Diagnosis not present

## 2024-03-30 DIAGNOSIS — K5900 Constipation, unspecified: Secondary | ICD-10-CM | POA: Diagnosis not present

## 2024-03-30 DIAGNOSIS — R053 Chronic cough: Secondary | ICD-10-CM | POA: Diagnosis not present

## 2024-03-30 MED ORDER — LORATADINE 10 MG PO TABS: 10.0000 mg | ORAL_TABLET | Freq: Every day | ORAL | 11 refills | Status: DC

## 2024-03-30 MED ORDER — POLYETHYLENE GLYCOL 3350 17 GM/SCOOP PO POWD: 17.0000 g | Freq: Two times a day (BID) | ORAL | 1 refills | Status: DC | PRN

## 2024-03-30 MED ORDER — AZELASTINE HCL 0.1 % NA SOLN: 1.0000 | Freq: Two times a day (BID) | NASAL | 12 refills | Status: AC

## 2024-03-30 MED ORDER — FLUTICASONE PROPIONATE 50 MCG/ACT NA SUSP: 2.0000 | Freq: Every day | NASAL | 6 refills | Status: AC

## 2024-03-30 NOTE — Progress Notes (Signed)
 "  Name: Joel [name]   Date of Visit: 03/30/2024   Date of last visit with me: 03/02/2024   CHIEF COMPLAINT:  Chief Complaint  Patient presents with   Acute Visit    Cough, gagging from coughing, nose running. PT states that symptoms have been going on since August, 2025. Had been going to a different Dr. And would be RX meds that would only last about a week pt states. Said with the meds he was given it would get better not not full recovery.        HPI:  Discussed the use of AI scribe software for clinical note transcription with the patient, who gave verbal consent to proceed.  History of Present Illness   Joel [name] is an 83 year old male who presents with a persistent cough and postnasal drip.  He has been experiencing a persistent cough that has been ongoing for a while. The cough seems to originate from his throat rather than his chest and is particularly bothersome at night when he is lying flat.  He takes omeprazole daily for acid reflux but does not experience any burning sensation in his chest. He also reports nasal congestion with symptoms of postnasal drip.         OBJECTIVE:       03/20/2024   10:59 AM  Depression screen PHQ 2/9  Decreased Interest 0  Down, Depressed, Hopeless 0  PHQ - 2 Score 0  Altered sleeping 0  Tired, decreased energy 1  Change in appetite 0  Feeling bad or failure about yourself  0  Trouble concentrating 0  Moving slowly or fidgety/restless 0  Suicidal thoughts 0  PHQ-9 Score 1  Difficult doing work/chores Not difficult at all     BP Readings from Last 3 Encounters:  03/30/24 130/66  03/25/24 131/74  03/21/24 126/70    BP 130/66   Pulse (!) 110   Wt 219 lb 3.2 oz (99.4 kg)   SpO2 98%   BMI 32.37 kg/m    Physical Exam          Physical Exam Constitutional:      Appearance: Normal appearance.  HENT:     Head: Normocephalic and atraumatic.     Nose: Congestion and rhinorrhea present.      Mouth/Throat:     Pharynx: Posterior oropharyngeal erythema present. No oropharyngeal exudate.  Cardiovascular:     Pulses: Normal pulses.  Pulmonary:     Effort: Pulmonary effort is normal.     Breath sounds: Normal breath sounds.  Neurological:     General: No focal deficit present.     Mental Status: He is alert and oriented to person, place, and time. Mental status is at baseline.     ASSESSMENT/PLAN:   Assessment & Plan Chronic cough  Post-nasal drip  Constipation, unspecified constipation type    Assessment and Plan    Chronic pharyngitis due to postnasal drip Chronic pharyngitis from postnasal drip causing throat irritation and cough, worsened by nocturnal drainage. No infection present. - Prescribed Claritin , one pill daily. - Prescribed Astelin nasal spray, one spray in the morning and one spray in the evening. - Prescribed Flonase  nasal spray, primarily at night. - Advised obtaining a humidifier for the room. - Informed that treatment may take a week to show improvement.      Constipation and Diarrhea - Recommend daily miralax  along with 40-50 ounces of water daily.    Timothee Gali A. Vita MD [name]  [facility] "

## 2024-03-30 NOTE — Patient Instructions (Signed)
 VISIT SUMMARY:  During your visit, we discussed your persistent cough and postnasal drip. You mentioned that your cough is particularly bothersome at night and that you have nasal congestion. We reviewed your current medications and symptoms.  YOUR PLAN:  -CHRONIC PHARYNGITIS DUE TO POSTNASAL DRIP: Chronic pharyngitis is a long-term inflammation of the throat caused by postnasal drip, which is the drainage of mucus from the nose down the back of the throat. This can lead to throat irritation and coughing, especially at night. We have prescribed Claritin  (one pill daily), Astelin  nasal spray (one spray in the morning and one spray in the evening), and Flonase  nasal spray (primarily at night). Additionally, using a humidifier in your room may help. Please note that it may take about a week to see improvement.  -Constipation: Please take your miralax  daily. One scoop a day with water.  INSTRUCTIONS:  Please follow the prescribed medication regimen and consider using a humidifier in your room. If your symptoms do not improve in a week or if they worsen, please schedule a follow-up appointment.

## 2024-04-02 NOTE — Progress Notes (Unsigned)
 "      OFFICE NOTE:    Date:  04/03/2024  ID:  Joel [name] [place], DOB 03-07-1941, MRN [id number] PCP: Vita Morrow, MD  [facility] Providers Cardiologist:  Lonni Cash, MD Electrophysiologist:  OLE ONEIDA HOLTS, MD        Coronary artery disease  S/p BMS to OM2 in 05/2012 Chronic angina / hx of multiple ED visits w recurrent chest pain  LHC 08/01/19: pRCA 40, mRCA 40, dRCA 50, RPDA 70 (too small for PCI), RPL3 70; pLCx 40; pLAD 40, mLAD 40, dLAD 70, EF 55-65>>med Rx MPI 04/17/21: no ischemia, infarction; EF 61 Paroxysmal atrial fibrillation  S/p attempted LAAO ([facility]) in 08/2022 [device could not be delivered] Off anticoagulation due to hx of GI bleeding  TTE 04/16/22: EF 60-65, no RWMA, Gr 1 DD, NL RVSF, mild AI, AV sclerosis, aortic root 39 mm, ascending aorta 40 mm  TEE 09/02/22: EF 60-65, NL RVSF, trivial AI, aortic root 41 mm, small PFO (bidirectional shunting) Complete heart block s/p Pacemaker  08/2021 Thoracic aortic aneurysm  CT 08/09/23: ascending aorta 40 mm, SoV 45 mm  CT 02/18/24: ascending aorta 44 mm  Aortic insufficiency  Hypertension  Hyperlipidemia  Diabetes mellitus  Chronic kidney disease  Hx of TIA Peripheral arterial disease  Hx of LE aneurysm repair remotely at [facility] S/p L pop-tibial bypass 2011 - followed by vasc surgery   Aortic atherosclerosis  Iliac artery aneurysm  Hx of GI bleeding  Prostate CA  Non-Hodgkin Lymphoma MALT of orbit  Iron deficiency anemia  Alpha thal trait Obesity  Chronic Obstructive Pulmonary Disease  OSA on CPAP  Hx of DVT         Discussed the use of AI scribe software for clinical note transcription with the patient, who gave verbal consent to proceed. History of Present Illness Joel [name] is a 83 y.o. male  Last seen by Dr. Cash in 07/2023. He was last seen in our clinic by Jackee Alberts, NP after a visit to the ED with chest pain. He recently went to the ED with near syncope on 03/25/24. CXR, head  CT had no acute changes. Troponins were neg x 2.   With his recent visit to the emergency room for near syncope, he describes episodes of feeling warm, sweaty, and weak, occurring while sitting, during a church service.  He had been standing up prior to this the same 2 songs.  He did not lose consciousness but felt weak for about an hour. He reports recent episodes of black stools, which have since resolved.  He has not had chest discomfort, shortness of breath, or leg swelling.     ROS-See HPI    Studies Reviewed:       LABS  03/25/24: Hgb 10.3, MCV 70.6, PLT 189K, K 3.7, SCr 1.12, Alb 3.4, ALT 12, eGFR > 60 Results Labs Lipid panel (10/26/2023): Total cholesterol 123, HDL 47, LDL 60, triglycerides 160   Risk Assessment/Calculations: CHA2DS2-VASc Score = 5   This indicates a 7.2% annual risk of stroke. The patient's score is based upon: CHF History: 0 HTN History: 1 Diabetes History: 1 Stroke History: 0 Vascular Disease History: 1 Age Score: 2 Gender Score: 0            Physical Exam:  VS:  BP 100/60   Pulse 65   Ht 5' 9 (1.753 m)   Wt 224 lb 3.2 oz (101.7 kg)   SpO2 98%   BMI 33.11 kg/m  Wt Readings from Last 3 Encounters:  04/03/24 224 lb 3.2 oz (101.7 kg)  03/30/24 219 lb 3.2 oz (99.4 kg)  03/25/24 218 lb 0.6 oz (98.9 kg)    Constitutional:      Appearance: Healthy appearance. Not in distress.  Neck:     Vascular: JVD normal.  Pulmonary:     Breath sounds: Normal breath sounds. No wheezing. No rales.  Cardiovascular:     Normal rate. Regular rhythm.     Murmurs: There is a grade 1/6 systolic murmur at the URSB.  Edema:    Peripheral edema absent.  Abdominal:     Palpations: Abdomen is soft.       Assessment and Plan:    Assessment & Plan Coronary artery disease of native artery of native heart with stable angina pectoris History of BMS to the OM2 in 2014.  Cardiac catheterization in 2021 with moderate nonobstructive disease.  He did have  severe disease in the RPDA which was too small for PCI and medical therapy was recommended.  Nuclear stress test in 2023 negative for ischemia. No current angina symptoms.  - Continue Imdur  120 mg daily. - Continue aspirin  81 mg daily. - Reduce amlodipine  to 5 mg daily as noted - Continue pravastatin  40 mg daily. Thoracic aortic aneurysm without rupture, unspecified part 44 mm by CT in November 2025.  - Continue annual surveillance of thoracic aortic aneurysm. Near syncope Essential hypertension Recent episode of near syncope with warmth, sweating, and weakness, lasting about an hour. No complete loss of consciousness. His symptoms sound consistent with a vasovagal episode. Blood pressure today is low at 100/52 mmHg. He does note recent black stools. However, this has resolved. Notes from the EDP when he was in the ER indicate his device was interrogated and no significant arrhythmias were noted.   - Decrease amlodipine  to 5 mg daily. - Monitor blood pressure at home. - Obtain BMET, CBC tooday. PAD (peripheral artery disease) History of left popliteal to tibial bypass in 2011.  Managed by vascular surgery. PAF (paroxysmal atrial fibrillation) (HCC) He has been taken off of anticoagulation secondary to history of GI bleeding.  He had a failed attempted left atrial appendage occlusion in May 2024.  He has opted to remain off of anticoagulation since. Heart block Pacemaker Follow up with EP as planned. Pure hypercholesterolemia Recent LDL optimal. - Continue pravastatin  40 mg daily. Anemia, unspecified type He has a history of GI bleeding, iron deficiency anemia as well as alpha thalassemia trait.  He did note some dark stools recently.  His blood pressure is running low today.  Obtain follow-up CBC as noted.      Dispo:  Return in about 6 months (around 10/02/2024) for Routine Follow Up, w/ Dr. Verlin.  Signed, Glendia Ferrier, PA-C   "

## 2024-04-03 ENCOUNTER — Other Ambulatory Visit (HOSPITAL_COMMUNITY): Payer: Self-pay

## 2024-04-03 ENCOUNTER — Encounter: Payer: Self-pay | Admitting: Physician Assistant

## 2024-04-03 ENCOUNTER — Ambulatory Visit: Admitting: Physician Assistant

## 2024-04-03 VITALS — BP 100/60 | HR 65 | Ht 69.0 in | Wt 224.2 lb

## 2024-04-03 DIAGNOSIS — I480 Paroxysmal atrial fibrillation: Secondary | ICD-10-CM

## 2024-04-03 DIAGNOSIS — I25118 Atherosclerotic heart disease of native coronary artery with other forms of angina pectoris: Secondary | ICD-10-CM

## 2024-04-03 DIAGNOSIS — I712 Thoracic aortic aneurysm, without rupture, unspecified: Secondary | ICD-10-CM

## 2024-04-03 DIAGNOSIS — Z950 Presence of cardiac pacemaker: Secondary | ICD-10-CM | POA: Diagnosis not present

## 2024-04-03 DIAGNOSIS — D649 Anemia, unspecified: Secondary | ICD-10-CM | POA: Diagnosis not present

## 2024-04-03 DIAGNOSIS — I10 Essential (primary) hypertension: Secondary | ICD-10-CM | POA: Diagnosis not present

## 2024-04-03 DIAGNOSIS — I459 Conduction disorder, unspecified: Secondary | ICD-10-CM

## 2024-04-03 DIAGNOSIS — R55 Syncope and collapse: Secondary | ICD-10-CM

## 2024-04-03 DIAGNOSIS — E7800 Pure hypercholesterolemia, unspecified: Secondary | ICD-10-CM | POA: Diagnosis not present

## 2024-04-03 DIAGNOSIS — I739 Peripheral vascular disease, unspecified: Secondary | ICD-10-CM

## 2024-04-03 MED ORDER — AMLODIPINE BESYLATE 5 MG PO TABS
5.0000 mg | ORAL_TABLET | Freq: Every day | ORAL | 11 refills | Status: AC
  Filled 2024-04-03: qty 30, 30d supply, fill #0
  Filled 2024-04-30: qty 30, 30d supply, fill #1

## 2024-04-03 NOTE — Assessment & Plan Note (Signed)
 Recent episode of near syncope with warmth, sweating, and weakness, lasting about an hour. No complete loss of consciousness. His symptoms sound consistent with a vasovagal episode. Blood pressure today is low at 100/52 mmHg. He does note recent black stools. However, this has resolved. Notes from the EDP when he was in the ER indicate his device was interrogated and no significant arrhythmias were noted.   - Decrease amlodipine  to 5 mg daily. - Monitor blood pressure at home. - Obtain BMET, CBC tooday.

## 2024-04-03 NOTE — Assessment & Plan Note (Signed)
 Recent LDL optimal. - Continue pravastatin  40 mg daily.

## 2024-04-03 NOTE — Assessment & Plan Note (Signed)
 He has a history of GI bleeding, iron deficiency anemia as well as alpha thalassemia trait.  He did note some dark stools recently.  His blood pressure is running low today.  Obtain follow-up CBC as noted.

## 2024-04-03 NOTE — Assessment & Plan Note (Signed)
Follow up with EP as planned. 

## 2024-04-03 NOTE — Assessment & Plan Note (Signed)
 44 mm by CT in November 2025.  - Continue annual surveillance of thoracic aortic aneurysm.

## 2024-04-03 NOTE — Patient Instructions (Addendum)
 Thank you for choosing [facility]!     Medication Instructions:  Take Amlodipine  5mg . Take one tablet daily.  If you need a refill on your cardiac medications before your next appointment, please call your pharmacy*   Lab Work: BMET, CBC If you have labs (blood work) drawn today and your tests are completely normal, you will receive your results only by: MyChart Message (if you have MyChart) OR A paper copy in the mail If you have any lab test that is abnormal or we need to change your treatment, we will call you to review the results.   Testing/Procedures: No procedures were ordered during today's visit.    Your next appointment:   6 month(s)   A letter will be mailed to you as a reminder to call the office for your next follow up appointment.    Provider:   Lonni Cash, MD     Follow-Up: At [facility], you and your health needs are our priority.  As part of our continuing mission to provide you with exceptional heart care, we have created designated Provider Care Teams.  These Care Teams include your primary Cardiologist (physician) and Advanced Practice Providers (APPs -  Physician Assistants and Nurse Practitioners) who all work together to provide you with the care you need, when you need it. We recommend signing up for the patient portal called MyChart.  Sign up information is provided on this After Visit Summary.  MyChart is used to connect with patients for Virtual Visits (Telemedicine).  Patients are able to view lab/test results, encounter notes, upcoming appointments, etc.  Non-urgent messages can be sent to your provider as well.   To learn more about what you can do with MyChart, go to [email or web address].

## 2024-04-04 ENCOUNTER — Ambulatory Visit: Payer: Self-pay | Admitting: Physician Assistant

## 2024-04-04 LAB — CBC
Hematocrit: 32.1 % — ABNORMAL LOW (ref 37.5–51.0)
Hemoglobin: 9.6 g/dL — ABNORMAL LOW (ref 13.0–17.7)
MCH: 21 pg — ABNORMAL LOW (ref 26.6–33.0)
MCHC: 29.9 g/dL — ABNORMAL LOW (ref 31.5–35.7)
MCV: 70 fL — ABNORMAL LOW (ref 79–97)
Platelets: 200 x10E3/uL (ref 150–450)
RBC: 4.57 x10E6/uL (ref 4.14–5.80)
RDW: 17.4 % — ABNORMAL HIGH (ref 11.6–15.4)
WBC: 4.4 x10E3/uL (ref 3.4–10.8)

## 2024-04-04 LAB — BASIC METABOLIC PANEL WITH GFR
BUN/Creatinine Ratio: 11 (ref 10–24)
BUN: 11 mg/dL (ref 8–27)
CO2: 25 mmol/L (ref 20–29)
Calcium: 9.2 mg/dL (ref 8.6–10.2)
Chloride: 100 mmol/L (ref 96–106)
Creatinine, Ser: 1.04 mg/dL (ref 0.76–1.27)
Glucose: 98 mg/dL (ref 70–99)
Potassium: 4.3 mmol/L (ref 3.5–5.2)
Sodium: 140 mmol/L (ref 134–144)
eGFR: 71 mL/min/1.73

## 2024-04-04 NOTE — Progress Notes (Signed)
Left voice mail for patient to call for lab results

## 2024-04-09 NOTE — Progress Notes (Signed)
 Patient has been notified directly; all questions, if any, were answered. Patient voiced understanding. Patient will be making appt with PCP also stated he's been experiencing lots of headaches. Will discuss with PCP.

## 2024-04-11 ENCOUNTER — Ambulatory Visit (INDEPENDENT_AMBULATORY_CARE_PROVIDER_SITE_OTHER): Admitting: Family Medicine

## 2024-04-11 VITALS — BP 138/70 | HR 90 | Temp 98.0°F | Wt 218.0 lb

## 2024-04-11 DIAGNOSIS — D649 Anemia, unspecified: Secondary | ICD-10-CM | POA: Diagnosis not present

## 2024-04-11 NOTE — Progress Notes (Signed)
" ° °  Name: Joel [name]   Date of Visit: 04/11/2024   Date of last visit with me: 03/30/2024   CHIEF COMPLAINT:  Chief Complaint  Patient presents with   Acute Visit    Constant headaches that comes and go. Also needs to follow-up on anemia. Had blood work done by cardiology       HPI:  Discussed the use of AI scribe software for clinical note transcription with the patient, who gave verbal consent to proceed.  History of Present Illness   Joel [name] is an 83 year old male with chronic anemia who presents with daily headaches.  He experiences daily headaches and has previously consulted a headache specialist without finding relief.  He has a history of chronic anemia, with hemoglobin levels recorded as low as 9.2. He is not currently taking iron supplements. In the past, he received blood transfusions that were effective for a significant period. He is not on any specific treatment for anemia at present.  He mentions a cough that has improved significantly, attributing the improvement to a medication he is using. Occasionally, drinking water still causes some throat irritation, but it is much better than before.         OBJECTIVE:       03/20/2024   10:59 AM  Depression screen PHQ 2/9  Decreased Interest 0  Down, Depressed, Hopeless 0  PHQ - 2 Score 0  Altered sleeping 0  Tired, decreased energy 1  Change in appetite 0  Feeling bad or failure about yourself  0  Trouble concentrating 0  Moving slowly or fidgety/restless 0  Suicidal thoughts 0  PHQ-9 Score 1  Difficult doing work/chores Not difficult at all     BP Readings from Last 3 Encounters:  04/11/24 138/70  04/03/24 100/60  03/30/24 130/66    BP 138/70   Pulse 90   Temp 98 F (36.7 C)   Wt 218 lb (98.9 kg)   SpO2 97%   BMI 32.19 kg/m    Physical Exam          Physical Exam  ASSESSMENT/PLAN:   Assessment & Plan Anemia, unspecified type    Assessment and Plan    Chronic  anemia Low hemoglobin, possibly causing headaches. Anemia persistent for years. Previous transfusions effective. Current anemia may be due to iron deficiency or other causes. - Ordered blood tests for iron levels and other anemia causes. - Ordered LDH, haptoglobin, B12 and folate - Consider iron supplementation if iron deficiency confirmed. - Discuss potential blood transfusions if anemia persists post-supplementation. - Follow-up next week to review labs and management.         Heberto Sturdevant A. Vita MD [facility] "

## 2024-04-12 LAB — IRON,TIBC AND FERRITIN PANEL
Ferritin: 145 ng/mL (ref 30–400)
Iron Saturation: 12 % — ABNORMAL LOW (ref 15–55)
Iron: 36 ug/dL — ABNORMAL LOW (ref 38–169)
Total Iron Binding Capacity: 292 ug/dL (ref 250–450)
UIBC: 256 ug/dL (ref 111–343)

## 2024-04-12 LAB — B12 AND FOLATE PANEL
Folate: 9 ng/mL
Vitamin B-12: 437 pg/mL (ref 232–1245)

## 2024-04-12 LAB — SEDIMENTATION RATE: Sed Rate: 57 mm/h — ABNORMAL HIGH (ref 0–30)

## 2024-04-12 LAB — HAPTOGLOBIN: Haptoglobin: 157 mg/dL (ref 38–329)

## 2024-04-12 LAB — C-REACTIVE PROTEIN: CRP: 8 mg/L (ref 0–10)

## 2024-04-12 LAB — LACTATE DEHYDROGENASE: LDH: 208 IU/L (ref 121–224)

## 2024-04-13 ENCOUNTER — Ambulatory Visit: Payer: Self-pay | Admitting: Family Medicine

## 2024-04-18 ENCOUNTER — Other Ambulatory Visit: Payer: Self-pay | Admitting: Cardiovascular Disease

## 2024-04-19 ENCOUNTER — Ambulatory Visit (INDEPENDENT_AMBULATORY_CARE_PROVIDER_SITE_OTHER): Admitting: Family Medicine

## 2024-04-19 VITALS — BP 126/70 | HR 61 | Wt 222.8 lb

## 2024-04-19 DIAGNOSIS — D509 Iron deficiency anemia, unspecified: Secondary | ICD-10-CM | POA: Diagnosis not present

## 2024-04-19 MED ORDER — IRON (FERROUS SULFATE) 325 (65 FE) MG PO TABS: 325.0000 mg | ORAL_TABLET | Freq: Every day | ORAL | 2 refills | Status: AC

## 2024-04-19 NOTE — Progress Notes (Signed)
" ° °  Name: BARRIE WALE   Date of Visit: 04/19/2024   Date of last visit with me: 04/11/2024   CHIEF COMPLAINT:  Chief Complaint  Patient presents with   Follow-up    1 week follow up to review labs and management.        HPI:  Discussed the use of AI scribe software for clinical note transcription with the patient, who gave verbal consent to proceed.  History of Present Illness   CAELLUM MANCIL is an 84 year old male who presents with low iron  levels and nasal congestion.  He has been experiencing low iron  levels. He denies any bleeding during bowel movements. He has started taking iron  pills daily, with a dosage of 75 mg elemental iron . He uses Miralax  either every other day or daily to manage potential constipation. Adequate fluid intake is maintained to prevent constipation and avoid abdominal pain and diarrhea.  He experiences nasal congestion, which improves with the use of nasal sprays used intermittently. Occasional coughing has improved since the last visit. He uses a humidifier to help with congestion, especially during dry weather, and finds it beneficial for his throat, particularly when sleeping with his mouth open.         OBJECTIVE:       03/20/2024   10:59 AM  Depression screen PHQ 2/9  Decreased Interest 0  Down, Depressed, Hopeless 0  PHQ - 2 Score 0  Altered sleeping 0  Tired, decreased energy 1  Change in appetite 0  Feeling bad or failure about yourself  0  Trouble concentrating 0  Moving slowly or fidgety/restless 0  Suicidal thoughts 0  PHQ-9 Score 1  Difficult doing work/chores Not difficult at all     BP Readings from Last 3 Encounters:  04/19/24 126/70  04/11/24 138/70  04/03/24 100/60    BP 126/70   Pulse 61   Wt 222 lb 12.8 oz (101.1 kg)   SpO2 99%   BMI 32.90 kg/m    Physical Exam          Physical Exam Constitutional:      Appearance: Normal appearance.  Neurological:     General: No focal deficit present.      Mental Status: He is alert and oriented to person, place, and time. Mental status is at baseline.     ASSESSMENT/PLAN:   Assessment & Plan Iron  deficiency anemia, unspecified iron  deficiency anemia type    Assessment and Plan    Iron  deficiency anemia Suspected due to low iron  levels. No signs of gastrointestinal bleeding. Potential causes include poor absorption or occult blood loss. - Continue daily iron  supplementation with 75 mg elemental iron . - Repeat iron  levels and hemoglobin in one month. - Sent iron  prescription to pharmacy for verification and cost comparison. - Monitor for constipation due to iron  supplementation.  Chronic nasal congestion Intermittent improvement from nasal sprays. Dry air exacerbates symptoms, leading to post-nasal drip and cough. - Continue using nasal sprays regularly. - Use a humidifier to maintain moisture in the air. - Monitor for improvement in symptoms and reduction in cough.  Constipation Potentially exacerbated by iron  supplementation. - Continue Miralax  every other day or daily. - Ensure adequate fluid intake to maintain regular bowel movements.         Kenasia Scheller A. Vita MD [facility] "

## 2024-04-23 ENCOUNTER — Ambulatory Visit (HOSPITAL_COMMUNITY)
Admission: EM | Admit: 2024-04-23 | Discharge: 2024-04-23 | Disposition: A | Discharge status: Home / Self Care | Attending: Emergency Medicine | Admitting: Emergency Medicine

## 2024-04-23 ENCOUNTER — Encounter (HOSPITAL_COMMUNITY): Payer: Self-pay | Admitting: *Deleted

## 2024-04-23 DIAGNOSIS — L02811 Cutaneous abscess of head [any part, except face]: Secondary | ICD-10-CM

## 2024-04-23 MED ORDER — DOXYCYCLINE HYCLATE 100 MG PO TABS: 100.0000 mg | ORAL_TABLET | Freq: Two times a day (BID) | ORAL | 0 refills | Status: AC

## 2024-04-23 NOTE — Discharge Instructions (Signed)
 You have an early abscess to the back of your head.  Please do warm compresses to the area twice daily.  You can take Tylenol  650 mg every 8 hours to help with any pain or inflammation.  Take the doxycycline  with breakfast and dinner over the next 7 days to help treat the infection.  If the pain does not improve, or you develop a fluctuant or squishy area then you can return to clinic and we can reevaluate you for potential incision and drainage.

## 2024-04-23 NOTE — ED Triage Notes (Signed)
 Pt states he has a knot on back of his head X 3 days. It is some painful. He has tried a heating pad on the area with no relief.

## 2024-04-23 NOTE — ED Provider Notes (Signed)
 " [facility]    CSN: [id number] Arrival date & time: 04/23/24  9063      History   Chief Complaint Chief Complaint  Patient presents with   Cyst    HPI Joel [name] is a 84 y.o. male.   Patient presents to clinic over concern of infection / abscess to the back of his head, along base of his skull x3 days.   Initially thinks the area maybe started out as a blackhead to the base of his skull.  Over the past few days it has started to gather pus and it has been very sore.  Denies drainage from the area and he has not had any fever.  Has not tried medications or interventions prior to arrival.  Has not had a similar issue in the past.  History of hypertension and he did not take his medications today but they are in his car.   The history is provided by the patient and medical records.    Past Medical History:  Diagnosis Date   Alpha thalassemia trait    Atypical chest pain    CAD (coronary artery disease)    a. Multiple prior evaluations then 05/2012 s/p BMS to OM2. b. Nuc 11/2012 wnl. c. Cath 2015: patent stent, moderate LAD/RCA dz, treated medically.   Cancer [facility])    Cataract    CHEST PAIN UNSPECIFIED 09/13/2008   Qualifier: Diagnosis of  By: Buell, RN, Heather     CKD (chronic kidney disease), stage II    COPD (chronic obstructive pulmonary disease) (HCC)    Cough secondary to angiotensin converting enzyme inhibitor (ACE-I)    Diabetes mellitus (HCC) 09/07/2019   Diabetes mellitus without complication (HCC)    Diverticulosis    DVT (deep venous thrombosis) (HCC)    Family history of breast cancer in first degree relative    GERD (gastroesophageal reflux disease)    Headache(784.0)    Hx: of   History of blood transfusion    I've had 2; don't know what it was related to (11/09/2012)   HLD (hyperlipidemia)    HTN (hypertension)    Iron  deficiency anemia    Lymphoproliferative disorder, low grade B cell (HCC) 05/21/2020   Microcytic anemia    PAF  (paroxysmal atrial fibrillation) (HCC)    Peripheral vascular disease    a. L pop-tibial bypass 2011, followed by [facility].   Personal history of prostate cancer    Presence of permanent cardiac pacemaker    Rotator cuff injury    left arm; never repaired (11/09/2012)    Patient Active Problem List   Diagnosis Date Noted   Acute cough 01/20/2024   Aortic aneurysm 08/18/2023   Vitamin D  deficiency 05/20/2023   Vitamin deficiency 05/12/2023   Fatigue 05/12/2023   Does mobilize using cane 05/12/2023   Paroxysmal atrial fibrillation (HCC) 09/02/2022   Chronic constipation 06/09/2022   Pulmonary nodule 02/17/2022   Anemia 01/22/2022   Pacemaker 12/17/2021   Heart block 08/04/2021   AV block, Mobitz 1 06/26/2021   Constipation, unspecified 05/11/2021   Non-Hodgkin lymphoma, unspecified, unspecified site (HCC) 05/11/2021   Encounter for therapeutic drug level monitoring 05/11/2021   Alpha thalassaemia minor 12/22/2020   Benign hypertensive heart disease 12/22/2020   Contact with and (suspected) exposure to other environmental pollution 12/22/2020   Aortic atherosclerosis 07/17/2020   Aneurysm of iliac artery 06/13/2020   Lymphoproliferative disorder, low grade B cell (HCC)-Stage 4 05/21/2020   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   Carcinoma of prostate (HCC) 09/06/2019   Sleep related hypoxia 04/03/2019   Obesity (BMI 30-39.9) 04/03/2019   Snoring 04/03/2019   At risk for sleep apnea 01/10/2019   Daytime somnolence 01/10/2019   Controlled type 2 diabetes mellitus with hyperglycemia, without long-term current use of insulin  (HCC) 12/11/2018   Hypoxic encephalopathy (HCC) 12/11/2018   Obesity hypoventilation syndrome (HCC) 12/11/2018   Chronic low back pain 09/11/2018   Seasonal allergies 07/04/2018   Pulsatile tinnitus 02/17/2018   Dizziness 01/20/2018   Malaise 01/20/2018   Decreased breath sounds 01/20/2018   Needs flu shot 01/20/2018    Chronic nonintractable headache 12/07/2017   Back pain 11/01/2017   Radicular pain of lower extremity 11/01/2017   Prostate cancer (HCC) 11/01/2017   Family history of breast cancer in first degree relative    Adenomatous polyp of colon 04/12/2017   Elliptocytosis on peripheral blood smear 09/13/2016   PVD (peripheral vascular disease) with claudication 11/15/2012   Hypokalemia 11/10/2012   Bradycardia 11/09/2012   Thrombocytopenia 06/01/2012   Microcytic anemia    Atherosclerosis of native artery of extremity with intermittent claudication 06/16/2011   Stable angina 06/11/2011   CAD in native artery    Chest pain, atypical 05/30/2011   Iron  deficiency anemia 10/03/2009   Diverticulosis of colon 10/03/2009   Transient cerebral ischemia 06/05/2008   SHOULDER PAIN, LEFT 10/11/2007   Chronic cough 05/07/2007   Hyperlipidemia 04/21/2007   Essential hypertension 04/21/2007   GERD 04/21/2007    Past Surgical History:  Procedure Laterality Date   CARDIOVASCULAR STRESS TEST  Aug. 2014   CATARACT EXTRACTION W/ INTRAOCULAR LENS IMPLANT Right ~ 2008   CIRCUMCISION     COLONOSCOPY     CORONARY ANGIOPLASTY WITH STENT PLACEMENT  2014   1 (11/09/2012)   ILIAC ARTERY ANEURYSM REPAIR     LEFT ATRIAL APPENDAGE OCCLUSION N/A 09/02/2022   Procedure: LEFT ATRIAL APPENDAGE OCCLUSION;  Surgeon: Cindie Ole DASEN, MD;  [facility];  Service: Cardiovascular;  Laterality: N/A;   LEFT HEART CATH  05-31-12   LEFT HEART CATH AND CORONARY ANGIOGRAPHY N/A 08/01/2019   Procedure: LEFT HEART CATH AND CORONARY ANGIOGRAPHY;  Surgeon: Verlin Lonni BIRCH, MD;  [facility];  Service: Cardiovascular;  Laterality: N/A;   LEFT HEART CATHETERIZATION WITH CORONARY ANGIOGRAM N/A 06/01/2011   Procedure: LEFT HEART CATHETERIZATION WITH CORONARY ANGIOGRAM;  Surgeon: Debby BIRCH Como, MD;  Location: [facility] CATH LAB;  Service: Cardiovascular;  Laterality: N/A;   LEFT HEART CATHETERIZATION  WITH CORONARY ANGIOGRAM N/A 05/31/2012   Procedure: LEFT HEART CATHETERIZATION WITH CORONARY ANGIOGRAM;  Surgeon: Peter M Jordan, MD;  Location: [facility] CATH LAB;  Service: Cardiovascular;  Laterality: N/A;   LEFT HEART CATHETERIZATION WITH CORONARY ANGIOGRAM N/A 08/01/2013   Procedure: LEFT HEART CATHETERIZATION WITH CORONARY ANGIOGRAM;  Surgeon: Lonni BIRCH Verlin, MD;  Location: [facility] CATH LAB;  Service: Cardiovascular;  Laterality: N/A;   PACEMAKER IMPLANT N/A 09/03/2021   Procedure: PACEMAKER IMPLANT;  Surgeon: Waddell Danelle ORN, MD;  [facility];  Service: Cardiovascular;  Laterality: N/A;   PERCUTANEOUS CORONARY STENT INTERVENTION (PCI-S)  05/31/2012   Procedure: PERCUTANEOUS CORONARY STENT INTERVENTION (PCI-S);  Surgeon: Peter M Jordan, MD;  Location: [facility] CATH LAB;  Service: Cardiovascular;;   PR VEIN BYPASS GRAFT,AORTO-FEM-POP Left 10/01/2009   left below knee popliteal artery to posterior tibial artery   SHOULDER ARTHROSCOPY WITH OPEN ROTATOR CUFF REPAIR AND DISTAL CLAVICLE ACROMINECTOMY Left 01/12/2013   Procedure: LEFT  SHOULDER ARTHROSCOPY WITH DEBRIDEMENT OPEN DISTAL CLAVICLE RESECTION ,acromioplastyAND ROTATOR CUFF REPAIR;  Surgeon: LELON JONETTA Shari Mickey., MD;  [facility];  Service: Orthopedics;  Laterality: Left;   TEE WITHOUT CARDIOVERSION N/A 09/02/2022   Procedure: TRANSESOPHAGEAL ECHOCARDIOGRAM;  Surgeon: Cindie Ole DASEN, MD;  Location: [facility] INVASIVE CV LAB;  Service: Cardiovascular;  Laterality: N/A;       Home Medications    Prior to Admission medications  Medication Sig Start Date End Date Taking? Authorizing Provider  amLODipine  (NORVASC ) 10 MG tablet TAKE 1 TABLET EVERY DAY 04/19/24  Yes Verlin Lonni JONETTA, MD  amLODipine  (NORVASC ) 5 MG tablet Take 1 tablet (5 mg total) by mouth daily. 04/03/24 04/03/25 Yes Weaver, Scott T, PA-C  aspirin  EC 81 MG tablet Take 1 tablet (81 mg total) by mouth daily. Swallow whole. 03/17/22  Yes Verlin Lonni JONETTA, MD   brimonidine  (ALPHAGAN ) 0.2 % ophthalmic solution Place 1 drop into both eyes 2 (two) times daily. 05/09/20  Yes [provider]  doxycycline  (VIBRA -TABS) 100 MG tablet Take 1 tablet (100 mg total) by mouth 2 (two) times daily for 7 days. 04/23/24 04/30/24 Yes Ball, Jamia Hoban  N, FNP  DULoxetine (CYMBALTA) 20 MG capsule 20 mg at bedtime. 10/24/23  Yes [provider]  gabapentin  (NEURONTIN ) 600 MG tablet Take 600 mg by mouth 2 (two) times daily.   Yes [provider]  Iron , Ferrous Sulfate , 325 (65 Fe) MG TABS Take 325 mg by mouth daily. 04/19/24  Yes Jha, Panav, MD  isosorbide  mononitrate (IMDUR ) 120 MG 24 hr tablet Take 1 tablet (120 mg total) by mouth daily. 02/21/23  Yes Verlin Lonni JONETTA, MD  latanoprost  (XALATAN ) 0.005 % ophthalmic solution Place 1 drop into both eyes at bedtime. 02/28/20  Yes [provider]  loratadine  (CLARITIN ) 10 MG tablet Take 1 tablet (10 mg total) by mouth daily. 03/30/24  Yes Jha, Panav, MD  omeprazole (PRILOSEC) 20 MG capsule Take 20 mg by mouth daily.   Yes [provider]  pravastatin  (PRAVACHOL ) 40 MG tablet TAKE 1 TABLET EVERY EVENING 04/19/24  Yes Verlin Lonni JONETTA, MD  tamsulosin  (FLOMAX ) 0.4 MG CAPS capsule Take 0.4 mg by mouth daily.   Yes [provider]  traMADol  (ULTRAM ) 50 MG tablet Take 50 mg by mouth 3 (three) times daily as needed.   Yes [provider]  abiraterone  acetate (ZYTIGA ) 250 MG tablet Take 250 mg by mouth daily. On an empty stomach 02/17/18   [provider]  Accu-Chek FastClix Lancets MISC Test once daily 11/23/18   Henson, Vickie L, NP-C  albuterol  (VENTOLIN  HFA) 108 (90 Base) MCG/ACT inhaler INHALE 2 PUFFS INTO THE LUNGS EVERY 6 HOURS AS NEEDED FOR WHEEZING OR SHORTNESS OF BREATH 02/27/24   Tysinger, Alm RAMAN, PA-C  azelastine  (ASTELIN ) 0.1 % nasal spray Place 1 spray into both nostrils 2 (two) times daily. Use in each nostril as directed 03/30/24   Vita Morrow, MD   diclofenac  Sodium (VOLTAREN ) 1 % GEL Apply 2 g topically 2 (two) times daily as needed (back pain). 06/16/20   [provider]  fluticasone  (FLONASE ) 50 MCG/ACT nasal spray Place 2 sprays into both nostrils daily. 03/30/24   Jha, Panav, MD  loperamide  (IMODIUM ) 2 MG capsule Take 1 capsule (2 mg total) by mouth as needed for diarrhea or loose stools. 03/02/24   Jha, Panav, MD  nitroGLYCERIN  (NITROSTAT ) 0.4 MG SL tablet Place 1 tablet (0.4 mg total) under the tongue every 5 (five) minutes as needed for chest  pain (max 3 doses). 02/19/22   Verlin Lonni BIRCH, MD  ondansetron  (ZOFRAN -ODT) 4 MG disintegrating tablet Take 1 tablet (4 mg total) by mouth every 8 (eight) hours as needed for nausea or vomiting. 03/02/24   Jha, Panav, MD  predniSONE  (DELTASONE ) 5 MG tablet Take 5 mg by mouth. 02/01/24   [provider]    Family History Family History  Problem Relation Age of Onset   Breast cancer Mother        dx 67s   Hyperlipidemia Mother    Hypertension Mother    COPD Father        deceased   Hyperlipidemia Father    Hypertension Father    Hypertension Sister    Cancer Brother 38       Unknown cancer, possibly stomach   Hypertension Brother    Cancer Brother 45       Unknown, possibly lung   Hypertension Daughter    Hypertension Son    Colon cancer Neg Hx        pt is unclear about family hx    Social History Social History[1]   Allergies   Topiramate, Atenolol , Lisinopril, and Tramadol -acetaminophen    Review of Systems Review of Systems  Per HPI  Physical Exam Triage Vital Signs ED Triage Vitals  Encounter Vitals Group     BP 04/23/24 1015 (!) 186/85     Girls Systolic BP Percentile --      Girls Diastolic BP Percentile --      Boys Systolic BP Percentile --      Boys Diastolic BP Percentile --      Pulse Rate 04/23/24 1015 63     Resp 04/23/24 1015 16     Temp 04/23/24 1015 98.2 F (36.8 C)     Temp Source 04/23/24 1015 Oral     SpO2  04/23/24 1015 98 %     Weight --      Height --      Head Circumference --      Peak Flow --      Pain Score 04/23/24 1013 8     Pain Loc --      Pain Education --      Exclude from Growth Chart --    No data found.  Updated Vital Signs BP (!) 186/85 (BP Location: Left Arm) Comment: not taken meds today  Pulse 63   Temp 98.2 F (36.8 C) (Oral)   Resp 16   SpO2 98%   Visual Acuity Right Eye Distance:   Left Eye Distance:   Bilateral Distance:    Right Eye Near:   Left Eye Near:    Bilateral Near:     Physical Exam Vitals and nursing note reviewed.  Constitutional:      Appearance: Normal appearance.  HENT:     Head: Normocephalic and atraumatic.      Right Ear: External ear normal.     Left Ear: External ear normal.     Nose: Nose normal.     Mouth/Throat:     Mouth: Mucous membranes are moist.  Eyes:     Conjunctiva/sclera: Conjunctivae normal.  Cardiovascular:     Rate and Rhythm: Normal rate.  Pulmonary:     Effort: Pulmonary effort is normal. No respiratory distress.  Skin:    General: Skin is warm and dry.     Capillary Refill: Capillary refill takes less than 2 seconds.  Neurological:     General: No focal deficit present.  Mental Status: He is alert.  Psychiatric:        Mood and Affect: Mood normal.      UC Treatments / Results  Labs (all labs ordered are listed, but only abnormal results are displayed) Labs Reviewed - No data to display  EKG   Radiology No results found.  Procedures Procedures (including critical care time)  Medications Ordered in UC Medications - No data to display  Initial Impression / Assessment and Plan / UC Course  I have reviewed the triage vital signs and the nursing notes.  Pertinent labs & imaging results that were available during my care of the patient were reviewed by me and considered in my medical decision making (see chart for details).  Vitals and triage reviewed, patient is hemodynamically  stable.  Indurated abscess to the base of the skull.  Not a candidate for incision and drainage, area is not fluctuant at this time.  Will trial doxycycline  and encouraged warm compress.  Return to clinic once antibiotics have been taken for 3 days for reevaluation.  Plan of care, follow-up care and return precautions given, no questions at this time.     Final Clinical Impressions(s) / UC Diagnoses   Final diagnoses:  Abscess of head     Discharge Instructions      You have an early abscess to the back of your head.  Please do warm compresses to the area twice daily.  You can take Tylenol  650 mg every 8 hours to help with any pain or inflammation.  Take the doxycycline  with breakfast and dinner over the next 7 days to help treat the infection.  If the pain does not improve, or you develop a fluctuant or squishy area then you can return to clinic and we can reevaluate you for potential incision and drainage.     ED Prescriptions     Medication Sig Dispense Auth. Provider   doxycycline  (VIBRA -TABS) 100 MG tablet Take 1 tablet (100 mg total) by mouth 2 (two) times daily for 7 days. 14 tablet Ball, Shineka Auble  N, FNP      PDMP not reviewed this encounter.     [1]  Social History Tobacco Use   Smoking status: Former    Current packs/day: 0.00    Average packs/day: 0.5 packs/day for 52.0 years (26.0 ttl pk-yrs)    Types: Cigarettes    Start date: 05/26/1959    Quit date: 05/26/2011    Years since quitting: 12.9    Passive exposure: Past   Smokeless tobacco: Never  Vaping Use   Vaping status: Never Used  Substance Use Topics   Alcohol use: No   Drug use: No     Mercer Karole SAILOR, FNP 04/23/24 1044  "

## 2024-04-24 ENCOUNTER — Telehealth: Payer: Self-pay

## 2024-04-24 DIAGNOSIS — R053 Chronic cough: Secondary | ICD-10-CM

## 2024-04-24 NOTE — Telephone Encounter (Signed)
 Spoke with patient he wants to know if there is any cough syrups he could try.

## 2024-04-24 NOTE — Telephone Encounter (Signed)
 Copied from CRM [id number]. Topic: Clinical - Prescription Issue >> Apr 24, 2024  2:20 PM Selinda RAMAN wrote: Reason for CRM: The patient called in stating the coughing medicine the little blue pills are not helping at all. He said he needs something that will make the cough go bye bye once and for all. He said whether it is prescription or whatever is advised over the counter. He said he still also has the other meds like nasal sprays that were prescribed. He uses   [facility] [phone] - [place], [place] - [address] AT [facility] OF [address]  Phone: [phone] Fax: [phone]   Please assist patient further

## 2024-04-25 MED ORDER — HYDROCODONE-ACETAMINOPHEN 7.5-325 MG/15ML PO SOLN: 15.0000 mL | Freq: Four times a day (QID) | ORAL | 0 refills | Status: DC | PRN

## 2024-04-25 NOTE — Telephone Encounter (Signed)
 Advised patient prescription was at pharmacy to pick up

## 2024-04-25 NOTE — Addendum Note (Signed)
 Addended by: Kein Carlberg on: 04/25/2024 09:12 AM   Modules accepted: Orders

## 2024-04-30 ENCOUNTER — Other Ambulatory Visit (HOSPITAL_COMMUNITY): Payer: Self-pay

## 2024-05-07 ENCOUNTER — Telehealth: Payer: Self-pay | Admitting: Internal Medicine

## 2024-05-07 NOTE — Telephone Encounter (Unsigned)
 Copied from CRM #[id number]. Topic: Clinical - Prescription Issue >> May 07, 2024  3:06 PM Willma R wrote: Reason for CRM: Patient is calling about his request for a refill of HYDROcodone -acetaminophen  (HYCET) 7.5-325 mg/15 ml solution. Per the encounter it was just refilled on 04/25/24 but the patient states he is out of the medication. Is requesting to see if he could be prescribed it again.  Patient can be reached at [phone]

## 2024-05-07 NOTE — Telephone Encounter (Signed)
" ° ° ° ° ° ° ° ° °  These have already been refilled   Copied from CRM #[id number]. Topic: Clinical - Medication Refill >> May 07, 2024  8:50 AM Berwyn MATSU wrote: Medication: azelastine  (ASTELIN ) 0.1 % nasal spray  fluticasone  (FLONASE ) 50 MCG/ACT nasal spray HYDROcodone -acetaminophen  (HYCET) 7.5-325 mg/15 ml solution  Has the patient contacted their pharmacy? Yes (Agent: If no, request that the patient contact the pharmacy for the refill. If patient does not wish to contact the pharmacy document the reason why and proceed with request.) (Agent: If yes, when and what did the pharmacy advise?)  This is the patient's preferred pharmacy:  [facility] [phone] - [place], [place] - [address] AT [facility] OF [address] [place] [address] Phone: [phone] Fax: [phone]  [id number] - [place], [place] - [address] AT [facility] OF [address] [place] [address] Phone: [phone] Fax: [phone]    Is this the correct pharmacy for this prescription? Yes If no, delete pharmacy and type the correct one.   Has the prescription been filled recently? Yes  Is the patient out of the medication? Yes  Has the patient been seen for an appointment in the last year OR does the patient have an upcoming appointment? Yes  Can we respond through MyChart? NO   Agent: Please be advised that Rx refills may take up to 3 business days. We ask that you follow-up with your pharmacy. "

## 2024-05-07 NOTE — Telephone Encounter (Signed)
LM for patient letting him know.

## 2024-05-07 NOTE — Telephone Encounter (Signed)
 Refill is not appropriate (narcotic cough medication). Needs f/u in office if still having ongoing issues with cough

## 2024-05-11 ENCOUNTER — Ambulatory Visit: Admitting: Medical

## 2024-05-11 VITALS — BP 120/70 | HR 75 | Wt 219.6 lb

## 2024-05-11 DIAGNOSIS — K219 Gastro-esophageal reflux disease without esophagitis: Secondary | ICD-10-CM | POA: Diagnosis not present

## 2024-05-11 DIAGNOSIS — J302 Other seasonal allergic rhinitis: Secondary | ICD-10-CM | POA: Diagnosis not present

## 2024-05-11 DIAGNOSIS — R053 Chronic cough: Secondary | ICD-10-CM

## 2024-05-11 MED ORDER — BENZONATATE 100 MG PO CAPS: 100.0000 mg | ORAL_CAPSULE | Freq: Two times a day (BID) | ORAL | 1 refills | Status: AC | PRN

## 2024-05-11 MED ORDER — HYDROCODONE-ACETAMINOPHEN 7.5-325 MG/15ML PO SOLN: 10.0000 mL | Freq: Two times a day (BID) | ORAL | 0 refills | Status: AC | PRN

## 2024-05-11 MED ORDER — LEVOCETIRIZINE DIHYDROCHLORIDE 5 MG PO TABS: 5.0000 mg | ORAL_TABLET | Freq: Every evening | ORAL | 2 refills | Status: AC

## 2024-05-11 MED ORDER — PANTOPRAZOLE SODIUM 40 MG PO TBEC: 40.0000 mg | DELAYED_RELEASE_TABLET | Freq: Two times a day (BID) | ORAL | 2 refills | Status: AC

## 2024-05-11 NOTE — Patient Instructions (Signed)
 You saw lung doctor twice in 2025 for chronic cough.  They recommended more aggressive treatment for possible allergy and reflux flare ups causing cough.     Today we will have you stop Loratadine / Claritin  and change to Xyzal  5mg  allergy tablet at bedtime  We will have you stop Prilosec 20mg  and change to Protonix  40mg  twice daily for reflux.   The morning dose should be on empty stomach about 30 minutes prior to breakfast  You can use Tessalone Perle/Benzonatate  cough drops as needed.  This was prescribed today  I refilled the Hydrocodone  syrup for worse cough, but don't use this more than twice daily, and we can't continue refilling this as it is a strong narcotic.    Drink plenty of water daily  If you continue to have lots of cough despite these changes, then consider follow up with the lung doctor you saw in 2025.

## 2024-05-11 NOTE — Progress Notes (Signed)
 "  Name: Joel [name]   Date of Visit: 05/11/24   CHIEF COMPLAINT:  Chief Complaint  Patient presents with   Medical Management of Chronic Issues    Med check. Needs refill on cough medication       HPI:  Discussed the use of AI scribe software for clinical note transcription with the patient, who gave verbal consent to proceed.  History of Present Illness   Joel [name] is an 84 year old male with chronic cough who presents for a medication refill.  He requires a refill for a narcotic cough syrup, which he uses for his persistent cough. The cough sometimes disrupts his sleep and has led to vomiting due to severe coughing fits, with a recent episode causing morning emesis.  He has a history of consultation with a pulmonologist at [facility] in September for this chronic cough. He is currently using an albuterol  inhaler as needed and a BiPAP machine for sleep apnea. He takes omeprazole 20 mg twice daily for acid reflux and Claritin  for allergies. He notes that Claritin , although a weaker allergy medication, has been somewhat effective. The inhaler has not provided significant relief during coughing episodes.  A chest CT in November, performed to evaluate an aneurysm, did not reveal any concerning lung issues. A pulmonary function test in May of the previous year was performed at the pulmonology office. No recent fever or hemoptysis.  His diet includes cooking his own meals, such as ham and cheeseburgers, without additional grease. He drinks plenty of water and avoids consuming a lot of spicy or greasy foods.  No other aggravating or relieving factors. No other complaint.  ROS as in subjective   OBJECTIVE:    BP 120/70   Pulse 75   Wt 219 lb 9.6 oz (99.6 kg)   SpO2 97%   BMI 32.43 kg/m    General appearence: alert, no distress, WD/WN,  HEENT: normocephalic, sclerae anicteric, PERRLA, EOMi, nares patent, no discharge or erythema, pharynx normal Oral cavity:  MMM, no lesions Neck: supple, no lymphadenopathy, no thyromegaly, no masses Heart: RRR, normal S1, S2, no murmurs Lungs: Some decreased breath sounds in general but no wheezes, rhonchi, or rales Abdomen: +bs, soft, non tender, non distended, no masses, no hepatomegaly, no splenomegaly Extremities: no edema, no cyanosis, no clubbing Pulses: 2+ symmetric, upper and lower extremities, normal cap refill     ASSESSMENT/PLAN:   Encounter Diagnoses  Name Primary?   Chronic cough    Seasonal allergies Yes   Gastroesophageal reflux disease, unspecified whether esophagitis present     Chronic cough Likely due to allergic rhinitis and GERD. Current treatment insufficient. Hydrocodone  syrup not suitable for long-term use.  I reviewed back over his scans from last year as well as 2 pulmonology visits last year regarding chronic cough.  PFT showed restriction but they felt like his cough is more related to allergy flare due to acid reflux.  He has no obvious acute illness today.  Lung exam is fairly clear - Discontinued loratadine , started Xyzal  5 mg at bedtime. - Discontinued omeprazole, started Protonix  40 mg twice daily, morning dose on empty stomach. - Prescribed Tessalon  Perles as needed. - Refilled hydrocodone  syrup for severe cough, max twice daily. - Advised pulmonology follow-up if cough persists. - Encouraged hydration, avoidance of spicy, greasy foods, and cold weather.  Allergic rhinitis Contributing to chronic cough. Loratadine  insufficient. - Discontinued loratadine , started Xyzal  5 mg at bedtime.  Gastroesophageal reflux disease Contributing to chronic cough.  Omeprazole insufficient. - Discontinue omeprazole, start Protonix  40 mg twice daily, morning dose on empty stomach.   [name] was seen today for medical management of chronic issues.  Diagnoses and all orders for this visit:  Seasonal allergies  Chronic cough -     HYDROcodone -acetaminophen  (HYCET) 7.5-325 mg/15 ml  solution; Take 10 mLs by mouth 2 (two) times daily as needed for moderate pain (pain score 4-6).  Gastroesophageal reflux disease, unspecified whether esophagitis present  Other orders -     levocetirizine (XYZAL ) 5 MG tablet; Take 1 tablet (5 mg total) by mouth every evening. -     pantoprazole  (PROTONIX ) 40 MG tablet; Take 1 tablet (40 mg total) by mouth 2 (two) times daily. -     benzonatate  (TESSALON ) 100 MG capsule; Take 1 capsule (100 mg total) by mouth 2 (two) times daily as needed for cough.    Follow up with PCP Dr. Vita and pulmonology  [facility] "

## 2024-05-22 ENCOUNTER — Ambulatory Visit: Admitting: Family Medicine

## 2024-06-11 ENCOUNTER — Ambulatory Visit

## 2024-09-10 ENCOUNTER — Ambulatory Visit

## 2024-12-10 ENCOUNTER — Ambulatory Visit

## 2025-03-11 ENCOUNTER — Ambulatory Visit

## 2025-03-26 ENCOUNTER — Ambulatory Visit
